# Patient Record
Sex: Female | Born: 1955 | Race: Black or African American | Hispanic: No | Marital: Married | Smoking: Current every day smoker
Health system: Southern US, Community
[De-identification: ages and names within clinical notes are randomized; demographics above are authoritative.]

## PROBLEM LIST (undated history)

## (undated) DIAGNOSIS — N823 Fistula of vagina to large intestine: Secondary | ICD-10-CM

## (undated) DIAGNOSIS — C719 Malignant neoplasm of brain, unspecified: Secondary | ICD-10-CM

## (undated) DIAGNOSIS — F329 Major depressive disorder, single episode, unspecified: Secondary | ICD-10-CM

## (undated) DIAGNOSIS — F419 Anxiety disorder, unspecified: Secondary | ICD-10-CM

## (undated) DIAGNOSIS — T7840XA Allergy, unspecified, initial encounter: Secondary | ICD-10-CM

## (undated) DIAGNOSIS — B1920 Unspecified viral hepatitis C without hepatic coma: Secondary | ICD-10-CM

## (undated) DIAGNOSIS — D649 Anemia, unspecified: Secondary | ICD-10-CM

## (undated) DIAGNOSIS — R32 Unspecified urinary incontinence: Secondary | ICD-10-CM

## (undated) DIAGNOSIS — G43909 Migraine, unspecified, not intractable, without status migrainosus: Secondary | ICD-10-CM

## (undated) DIAGNOSIS — E119 Type 2 diabetes mellitus without complications: Secondary | ICD-10-CM

## (undated) DIAGNOSIS — K7460 Unspecified cirrhosis of liver: Secondary | ICD-10-CM

## (undated) DIAGNOSIS — R569 Unspecified convulsions: Secondary | ICD-10-CM

## (undated) DIAGNOSIS — I639 Cerebral infarction, unspecified: Secondary | ICD-10-CM

## (undated) DIAGNOSIS — F32A Depression, unspecified: Secondary | ICD-10-CM

## (undated) DIAGNOSIS — K5792 Diverticulitis of intestine, part unspecified, without perforation or abscess without bleeding: Secondary | ICD-10-CM

## (undated) DIAGNOSIS — K852 Alcohol induced acute pancreatitis without necrosis or infection: Secondary | ICD-10-CM

## (undated) DIAGNOSIS — K219 Gastro-esophageal reflux disease without esophagitis: Secondary | ICD-10-CM

## (undated) DIAGNOSIS — J42 Unspecified chronic bronchitis: Secondary | ICD-10-CM

## (undated) DIAGNOSIS — J439 Emphysema, unspecified: Secondary | ICD-10-CM

## (undated) DIAGNOSIS — F1911 Other psychoactive substance abuse, in remission: Secondary | ICD-10-CM

## (undated) DIAGNOSIS — F1011 Alcohol abuse, in remission: Secondary | ICD-10-CM

## (undated) DIAGNOSIS — Z8619 Personal history of other infectious and parasitic diseases: Secondary | ICD-10-CM

## (undated) DIAGNOSIS — I509 Heart failure, unspecified: Secondary | ICD-10-CM

## (undated) DIAGNOSIS — I10 Essential (primary) hypertension: Secondary | ICD-10-CM

## (undated) DIAGNOSIS — R161 Splenomegaly, not elsewhere classified: Secondary | ICD-10-CM

## (undated) DIAGNOSIS — N189 Chronic kidney disease, unspecified: Secondary | ICD-10-CM

## (undated) DIAGNOSIS — R188 Other ascites: Secondary | ICD-10-CM

## (undated) HISTORY — DX: Malignant neoplasm of brain, unspecified: C71.9

## (undated) HISTORY — DX: Fistula of vagina to large intestine: N82.3

## (undated) HISTORY — DX: Gastro-esophageal reflux disease without esophagitis: K21.9

## (undated) HISTORY — DX: Other psychoactive substance abuse, in remission: F19.11

## (undated) HISTORY — DX: Unspecified chronic bronchitis: J42

## (undated) HISTORY — DX: Unspecified convulsions: R56.9

## (undated) HISTORY — PX: JOINT REPLACEMENT: SHX530

## (undated) HISTORY — DX: Essential (primary) hypertension: I10

## (undated) HISTORY — DX: Alcohol induced acute pancreatitis without necrosis or infection: K85.20

## (undated) HISTORY — DX: Depression, unspecified: F32.A

## (undated) HISTORY — DX: Allergy, unspecified, initial encounter: T78.40XA

## (undated) HISTORY — DX: Cerebral infarction, unspecified: I63.9

## (undated) HISTORY — DX: Migraine, unspecified, not intractable, without status migrainosus: G43.909

## (undated) HISTORY — DX: Unspecified viral hepatitis C without hepatic coma: B19.20

## (undated) HISTORY — DX: Diverticulitis of intestine, part unspecified, without perforation or abscess without bleeding: K57.92

## (undated) HISTORY — DX: Unspecified urinary incontinence: R32

## (undated) HISTORY — DX: Major depressive disorder, single episode, unspecified: F32.9

## (undated) HISTORY — DX: Emphysema, unspecified: J43.9

## (undated) HISTORY — PX: EYE SURGERY: SHX253

## (undated) HISTORY — DX: Alcohol abuse, in remission: F10.11

## (undated) HISTORY — DX: Chronic kidney disease, unspecified: N18.9

## (undated) HISTORY — PX: DILATION AND CURETTAGE OF UTERUS: SHX78

---

## 1977-07-08 HISTORY — PX: ABDOMINAL HYSTERECTOMY: SHX81

## 1997-07-08 HISTORY — PX: APPENDECTOMY: SHX54

## 1999-07-09 HISTORY — PX: CHOLECYSTECTOMY: SHX55

## 2003-07-09 HISTORY — PX: REPLACEMENT TOTAL KNEE: SUR1224

## 2003-08-08 ENCOUNTER — Other Ambulatory Visit: Payer: Self-pay

## 2004-05-19 ENCOUNTER — Emergency Department: Payer: Self-pay | Admitting: Emergency Medicine

## 2004-05-19 ENCOUNTER — Other Ambulatory Visit: Payer: Self-pay

## 2004-06-05 ENCOUNTER — Ambulatory Visit: Payer: Self-pay | Admitting: Internal Medicine

## 2004-06-06 ENCOUNTER — Ambulatory Visit: Payer: Self-pay | Admitting: Internal Medicine

## 2004-07-03 ENCOUNTER — Emergency Department: Payer: Self-pay | Admitting: Emergency Medicine

## 2004-07-08 HISTORY — PX: COLON SURGERY: SHX602

## 2004-08-12 ENCOUNTER — Emergency Department: Payer: Self-pay | Admitting: Emergency Medicine

## 2004-08-13 ENCOUNTER — Other Ambulatory Visit: Payer: Self-pay

## 2004-08-20 ENCOUNTER — Other Ambulatory Visit: Payer: Self-pay

## 2004-08-20 ENCOUNTER — Emergency Department: Payer: Self-pay | Admitting: Emergency Medicine

## 2004-08-23 ENCOUNTER — Emergency Department: Payer: Self-pay | Admitting: Internal Medicine

## 2004-08-23 ENCOUNTER — Other Ambulatory Visit: Payer: Self-pay

## 2004-08-30 ENCOUNTER — Ambulatory Visit: Payer: Self-pay

## 2004-09-01 ENCOUNTER — Inpatient Hospital Stay: Payer: Self-pay | Admitting: Internal Medicine

## 2004-09-01 ENCOUNTER — Other Ambulatory Visit: Payer: Self-pay

## 2004-10-23 ENCOUNTER — Emergency Department: Payer: Self-pay | Admitting: Emergency Medicine

## 2004-12-25 ENCOUNTER — Ambulatory Visit: Payer: Self-pay | Admitting: Surgery

## 2005-01-03 ENCOUNTER — Other Ambulatory Visit: Payer: Self-pay

## 2005-01-09 ENCOUNTER — Inpatient Hospital Stay: Payer: Self-pay | Admitting: Surgery

## 2005-01-25 ENCOUNTER — Emergency Department: Payer: Self-pay | Admitting: Emergency Medicine

## 2005-02-12 ENCOUNTER — Inpatient Hospital Stay: Payer: Self-pay | Admitting: Surgery

## 2005-02-25 ENCOUNTER — Emergency Department: Payer: Self-pay | Admitting: Emergency Medicine

## 2005-02-26 ENCOUNTER — Inpatient Hospital Stay: Payer: Self-pay | Admitting: Vascular Surgery

## 2005-04-24 ENCOUNTER — Ambulatory Visit: Payer: Self-pay | Admitting: Surgery

## 2005-07-02 ENCOUNTER — Emergency Department: Payer: Self-pay | Admitting: Emergency Medicine

## 2005-07-08 DIAGNOSIS — I639 Cerebral infarction, unspecified: Secondary | ICD-10-CM

## 2005-07-08 DIAGNOSIS — C719 Malignant neoplasm of brain, unspecified: Secondary | ICD-10-CM

## 2005-07-08 HISTORY — PX: BRAIN SURGERY: SHX531

## 2005-07-08 HISTORY — DX: Malignant neoplasm of brain, unspecified: C71.9

## 2005-07-08 HISTORY — DX: Cerebral infarction, unspecified: I63.9

## 2005-07-17 ENCOUNTER — Ambulatory Visit: Payer: Self-pay | Admitting: Surgery

## 2005-07-28 ENCOUNTER — Inpatient Hospital Stay: Payer: Self-pay | Admitting: Surgery

## 2005-07-30 ENCOUNTER — Other Ambulatory Visit: Payer: Self-pay

## 2005-09-02 ENCOUNTER — Other Ambulatory Visit: Payer: Self-pay

## 2005-09-02 ENCOUNTER — Inpatient Hospital Stay: Payer: Self-pay | Admitting: Internal Medicine

## 2005-12-03 ENCOUNTER — Emergency Department: Payer: Self-pay | Admitting: Emergency Medicine

## 2005-12-11 ENCOUNTER — Ambulatory Visit: Payer: Self-pay | Admitting: Surgery

## 2006-01-09 ENCOUNTER — Ambulatory Visit: Payer: Self-pay | Admitting: Surgery

## 2006-01-23 ENCOUNTER — Ambulatory Visit: Payer: Self-pay | Admitting: Surgery

## 2006-02-25 ENCOUNTER — Inpatient Hospital Stay: Payer: Self-pay | Admitting: Surgery

## 2006-03-20 ENCOUNTER — Other Ambulatory Visit: Payer: Self-pay

## 2006-03-21 ENCOUNTER — Inpatient Hospital Stay: Payer: Self-pay | Admitting: Internal Medicine

## 2006-05-08 ENCOUNTER — Ambulatory Visit: Payer: Self-pay | Admitting: Internal Medicine

## 2006-05-17 IMAGING — CT CT HEAD WITHOUT CONTRAST
2 series · 16 of 30 positions shown, 20 images · non-contrast
Comparison: none

REASON FOR EXAM: vomitting
COMMENTS:

[Series 2: without · axial · non-contrast · 0.40mm/px · z∈[+154,+274]mm · 13 of 28 slices shown, 17 images]
[im 2/28  brain]
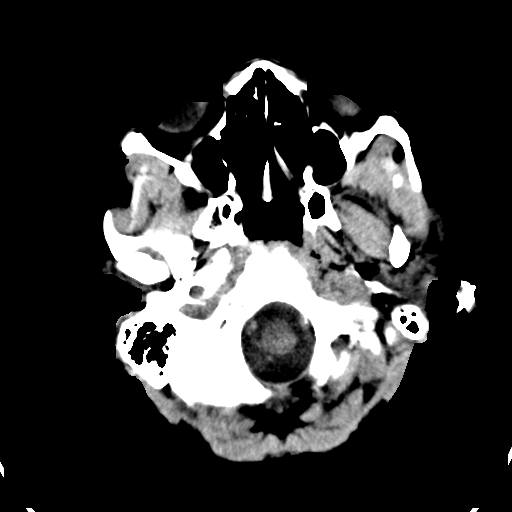
[im 2/28  bone]
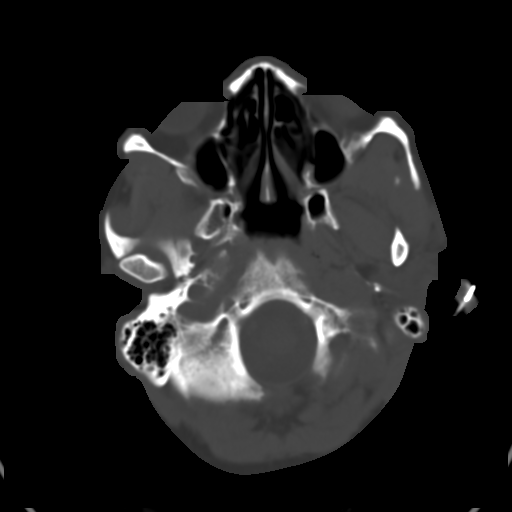
[im 4/28  brain]
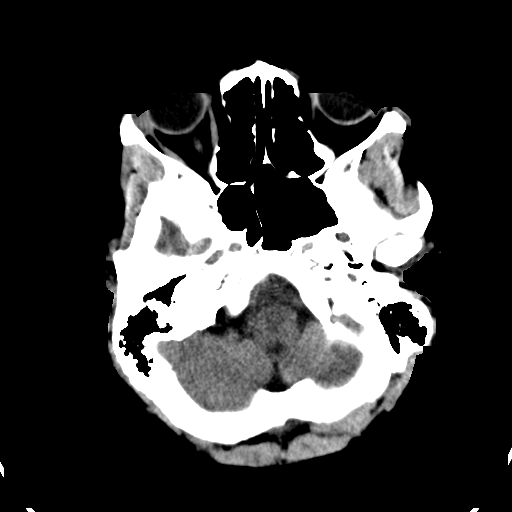
[im 6/28  brain]
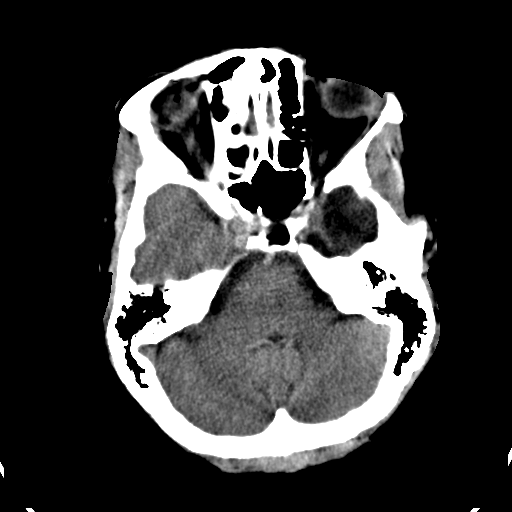
[im 8/28  brain]
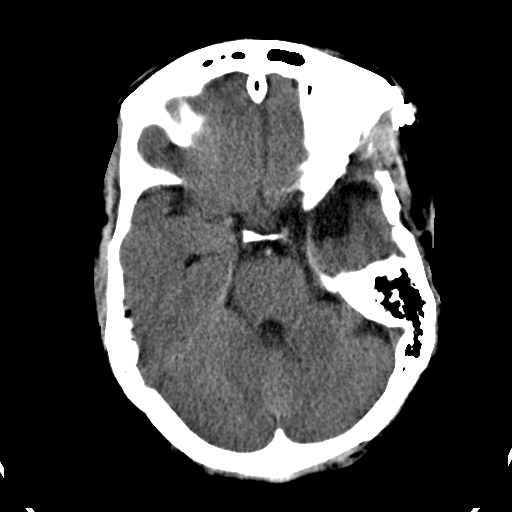
[im 10/28  brain]
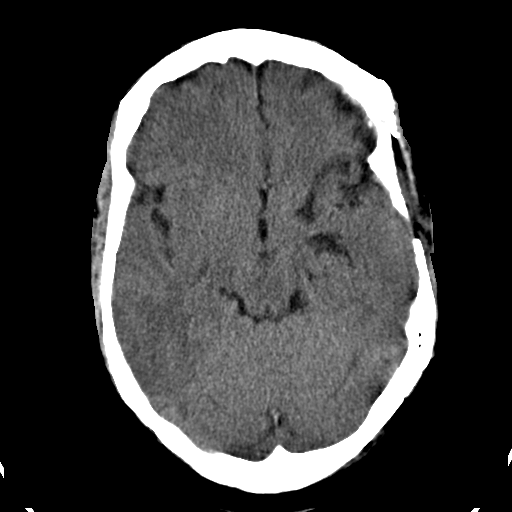
[im 10/28  bone]
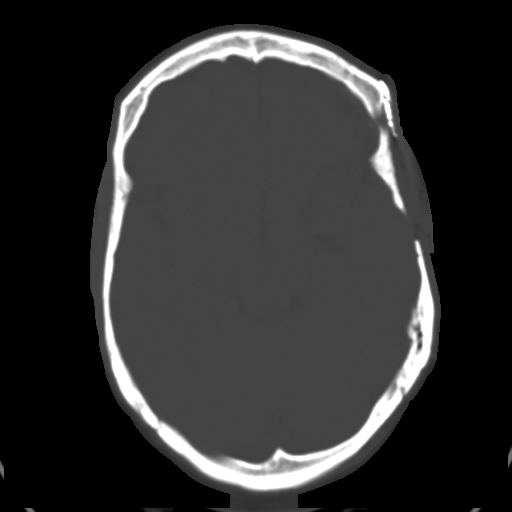
[im 12/28  brain]
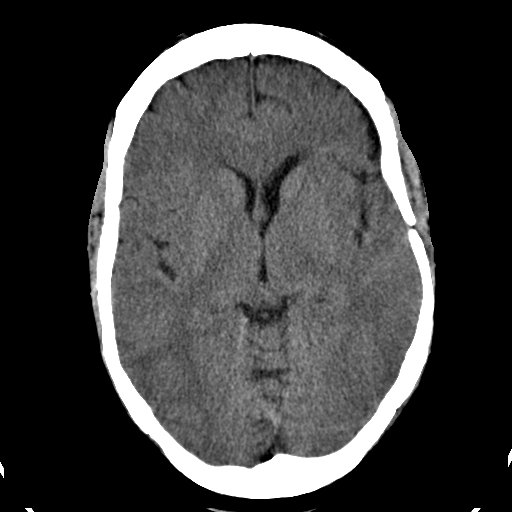
[im 14/28  brain]
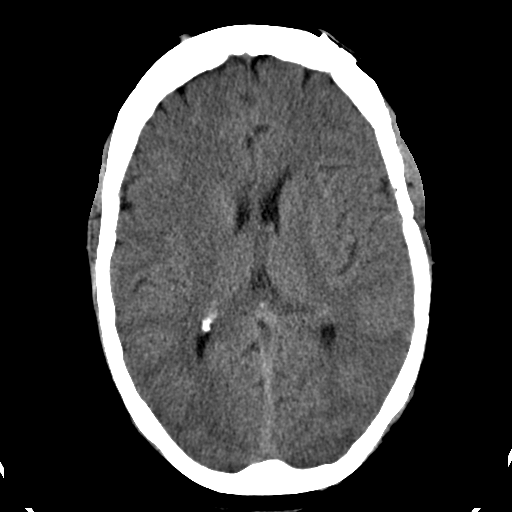
[im 16/28  brain]
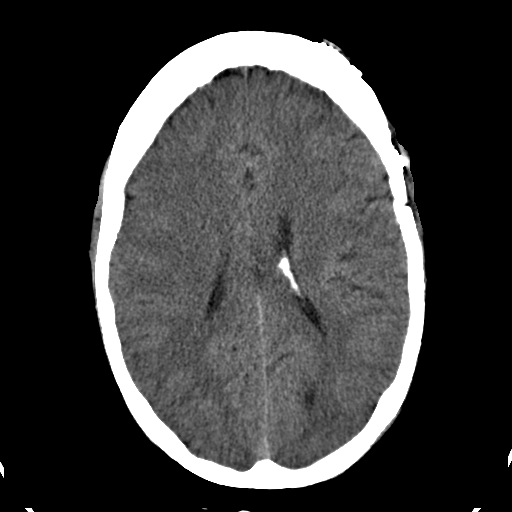
[im 18/28  brain]
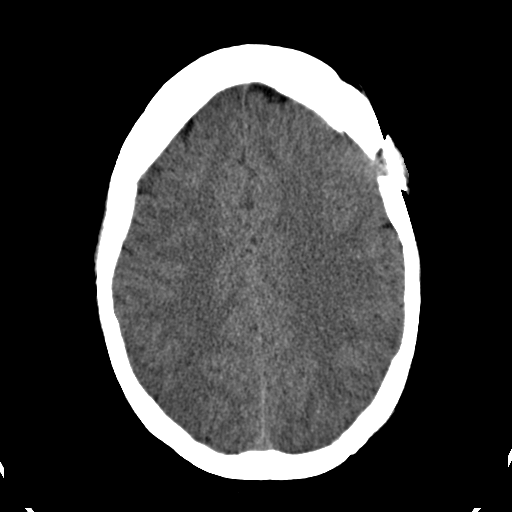
[im 18/28  bone]
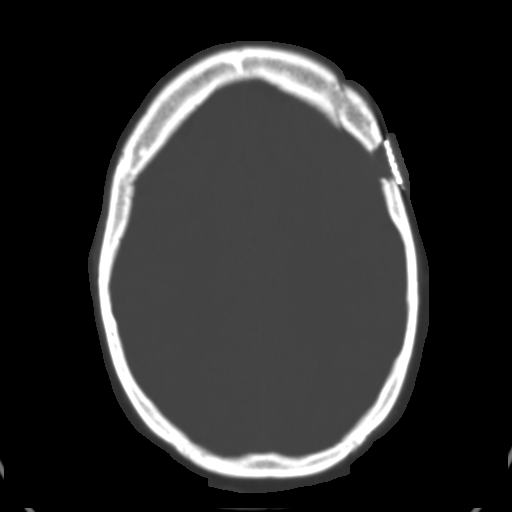
[im 20/28  brain]
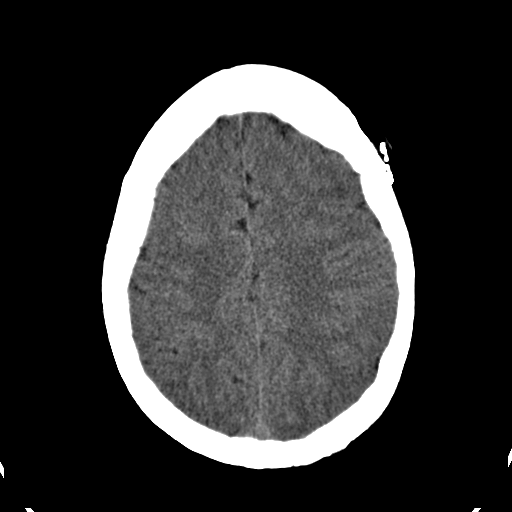
[im 22/28  brain]
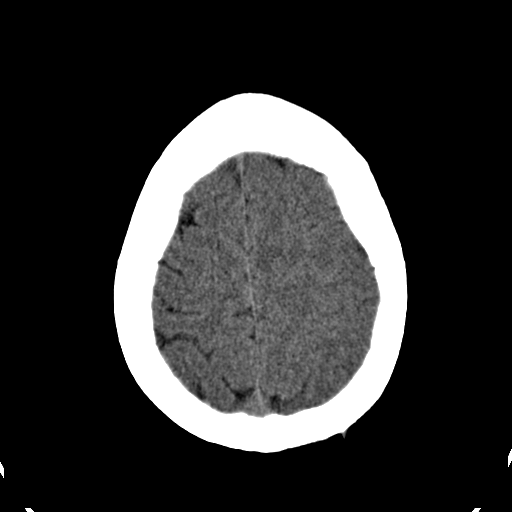
[im 24/28  brain]
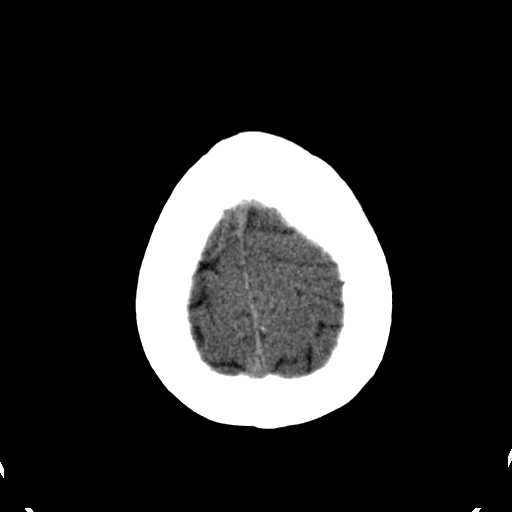
[im 26/28  brain]
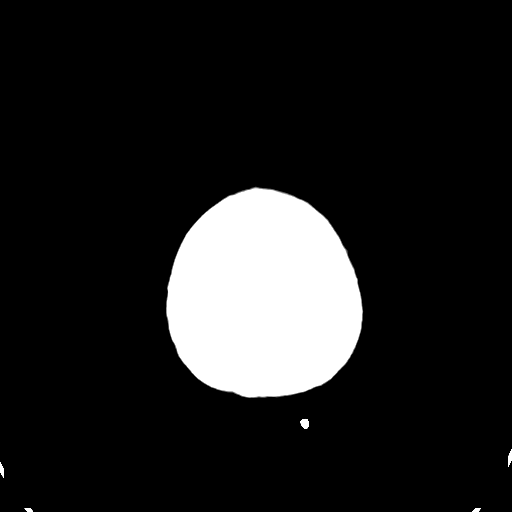
[im 26/28  bone]
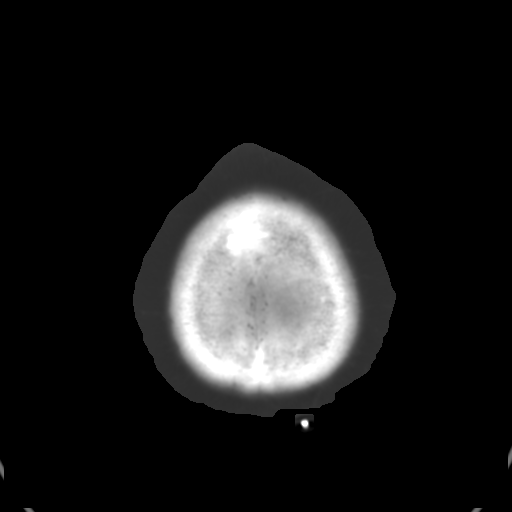

[Series 3: bone windows · axial · 0.40mm/px · z∈[+154,+194]mm · 3 of 28 slices shown]
[im 2/28  bone]
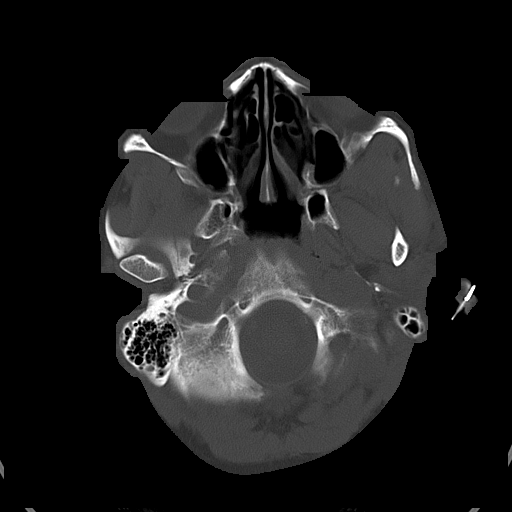
[im 6/28  bone]
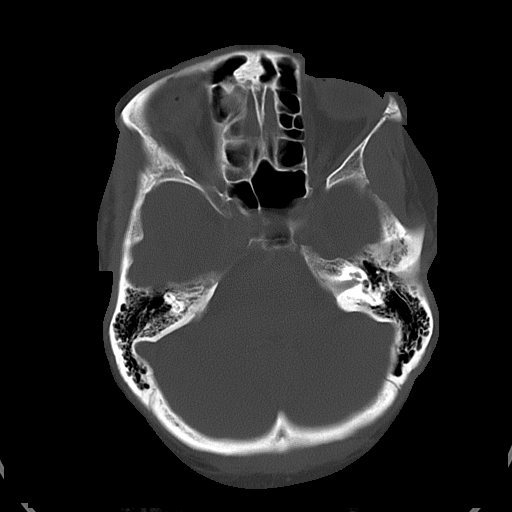
[im 10/28  bone]
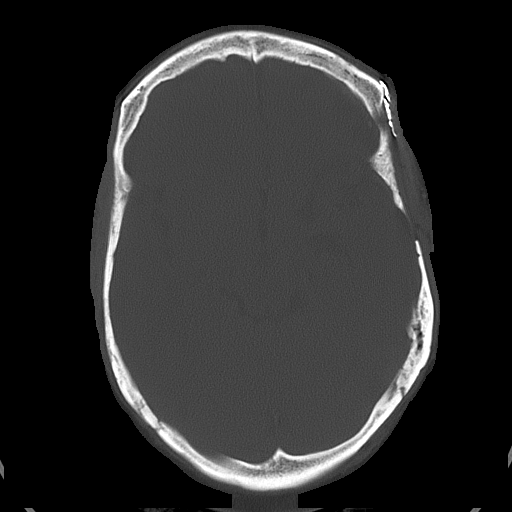

[16 of 30 positions shown; findings below may reference images not displayed]

PROCEDURE:     CT  - CT HEAD WITHOUT CONTRAST  - June 06, 2004 [DATE]

RESULT:     The patient is experiencing unexplained vomiting.  The patient
has a history of brain tumor with surgery in the past.  Comparison is made
to study 01 March, 2004.

There is post surgical changes noted on the LEFT in the temporal fossa.
This appears stable.  I do not see evidence of a mass or mass effect
elsewhere within the brain.  There is no evidence of an intracranial
hemorrhage. At bone window settings the post surgical defects are visible in
the LEFT frontal and parietal regions.
IMPRESSION: 1)There are chronic changes in the LEFT temporal fossa from the patient's
prior surgery.  I do not see evidence of acute intracranial abnormality.

## 2006-05-20 ENCOUNTER — Ambulatory Visit: Payer: Self-pay | Admitting: Internal Medicine

## 2006-07-02 ENCOUNTER — Other Ambulatory Visit: Payer: Self-pay

## 2006-07-02 ENCOUNTER — Emergency Department: Payer: Self-pay | Admitting: Unknown Physician Specialty

## 2006-08-14 IMAGING — US ABDOMEN ULTRASOUND
1 series · 17 of 25 positions shown · non-contrast
Comparison: none

REASON FOR EXAM: Ascites
COMMENTS:

[Series 1: abdomen ultrasound · 17 of 50 slices shown]
[im 1/50]
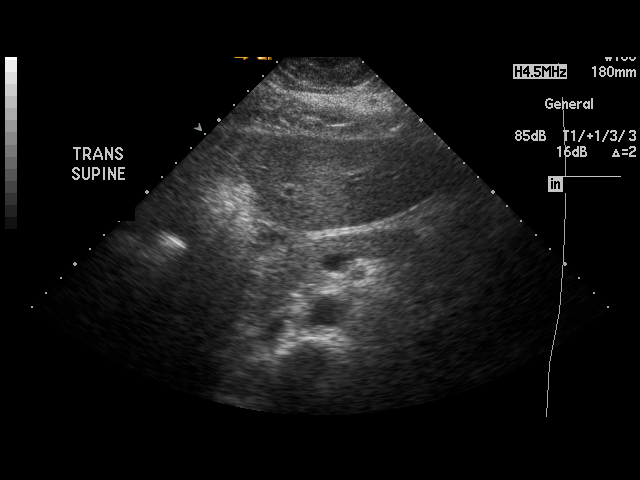
[im 5/50]
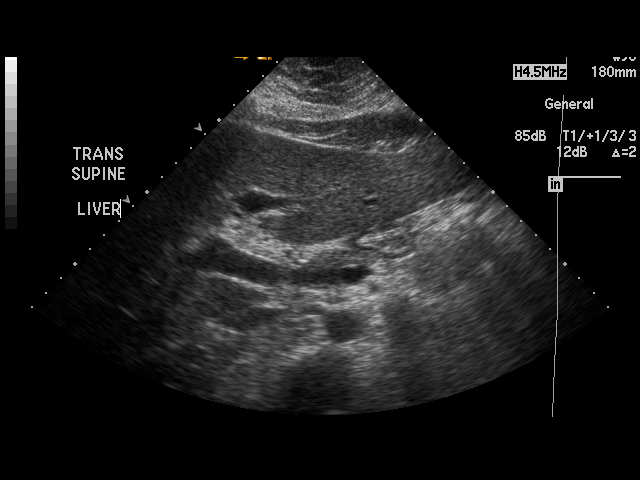
[im 7/50]
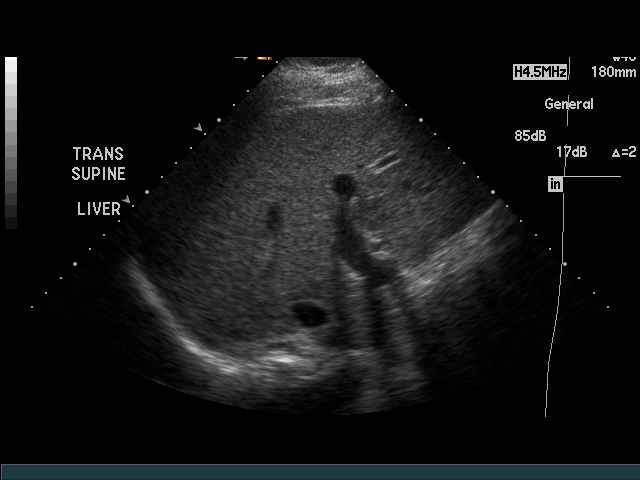
[im 11/50]
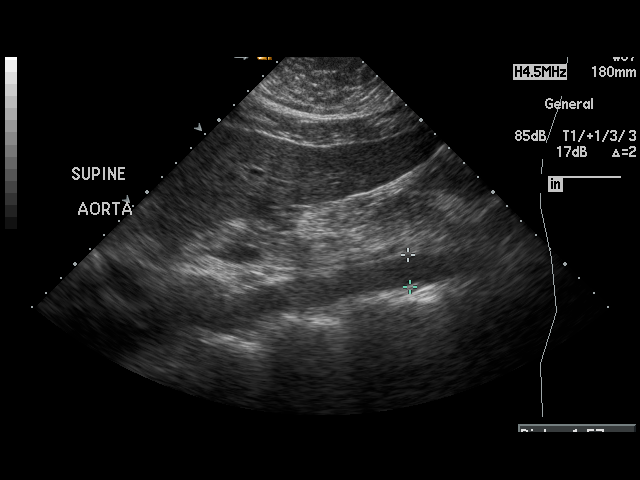
[im 13/50]
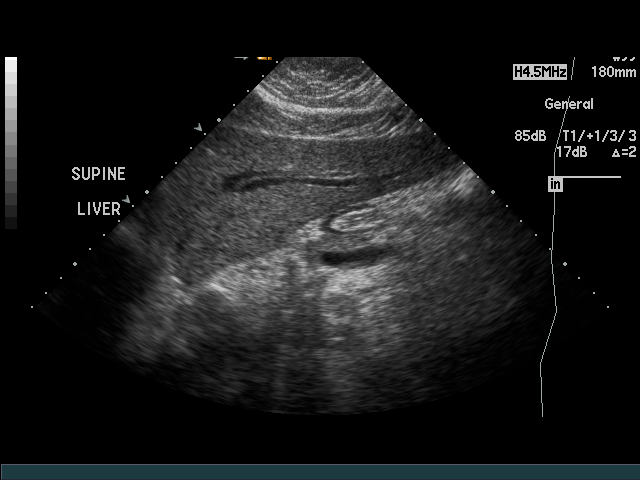
[im 17/50]
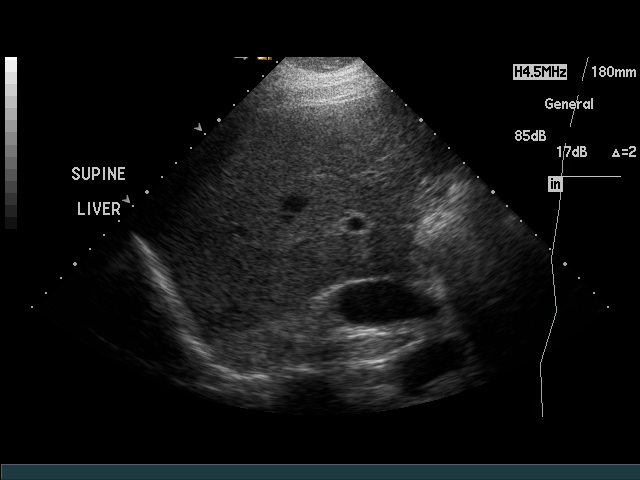
[im 19/50]
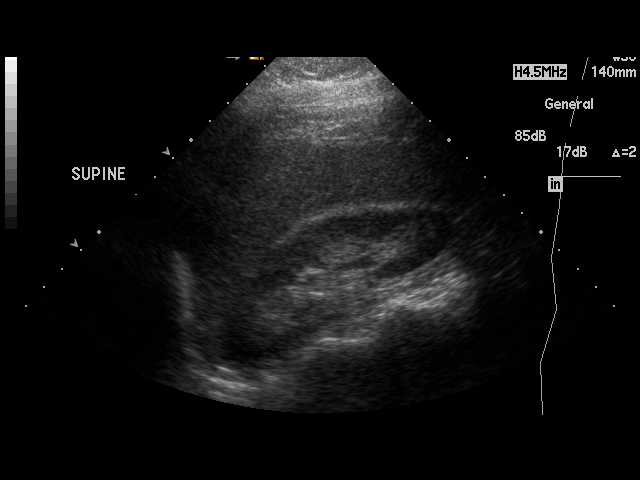
[im 23/50]
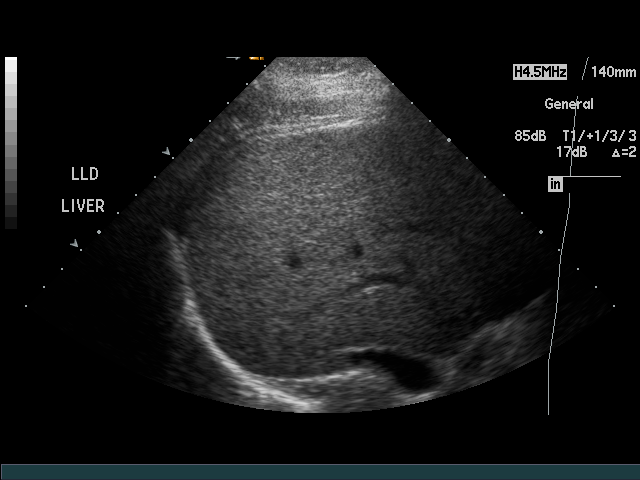
[im 25/50]
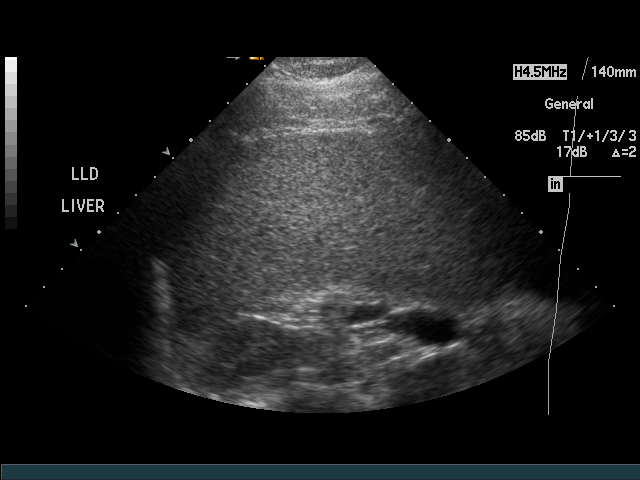
[im 27/50]
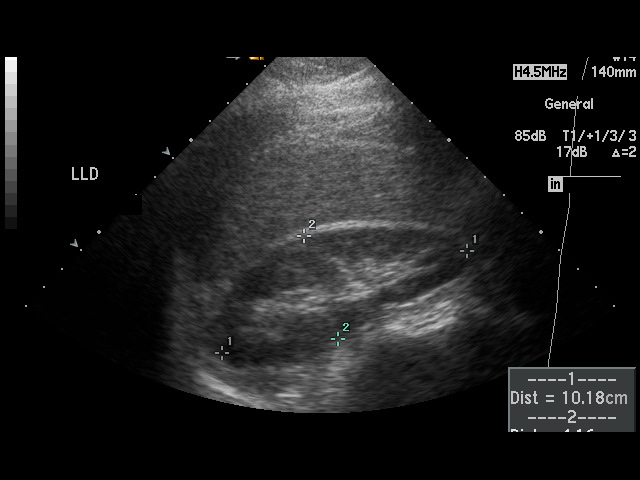
[im 31/50]
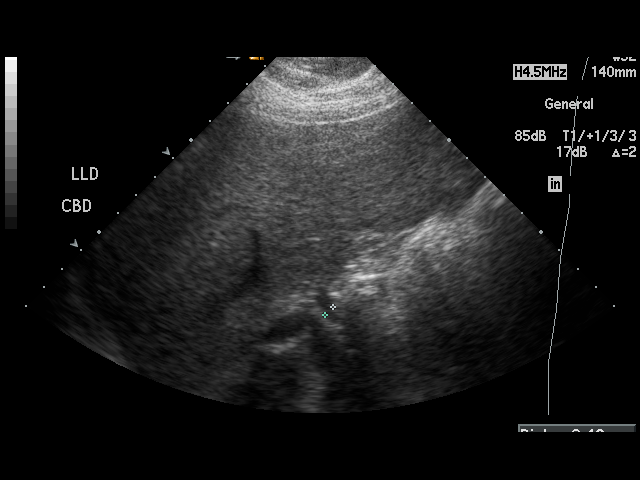
[im 33/50]
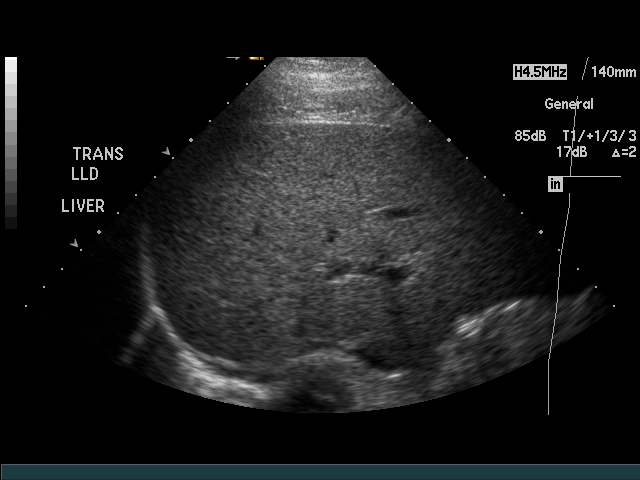
[im 37/50]
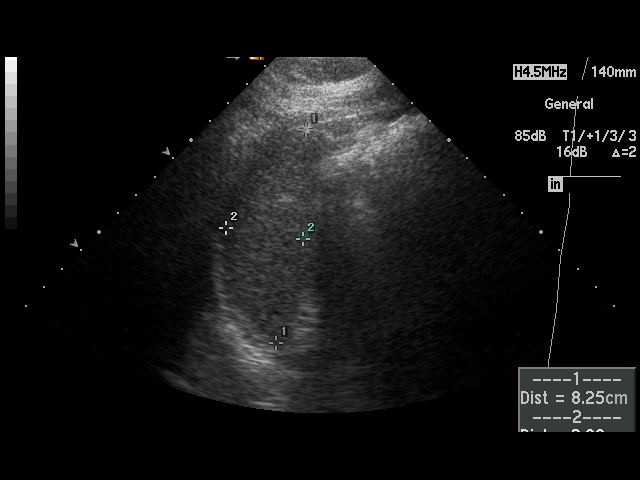
[im 39/50]
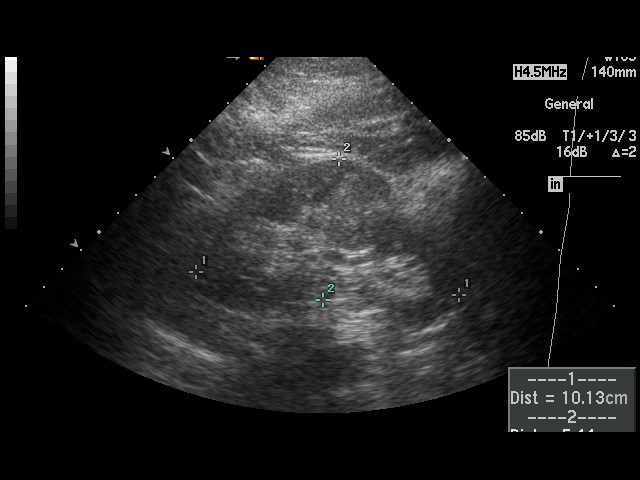
[im 43/50]
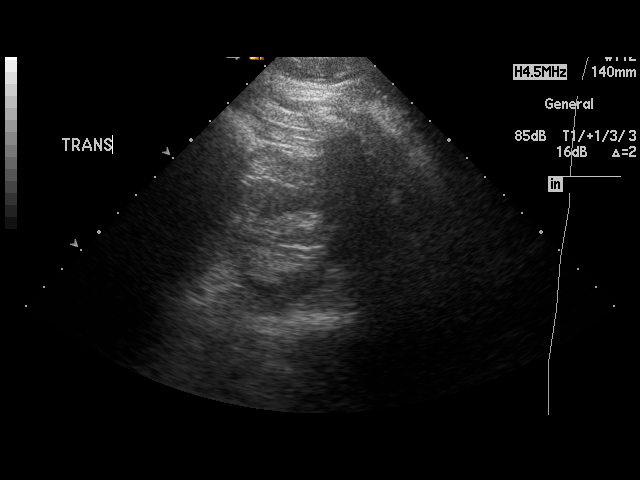
[im 45/50]
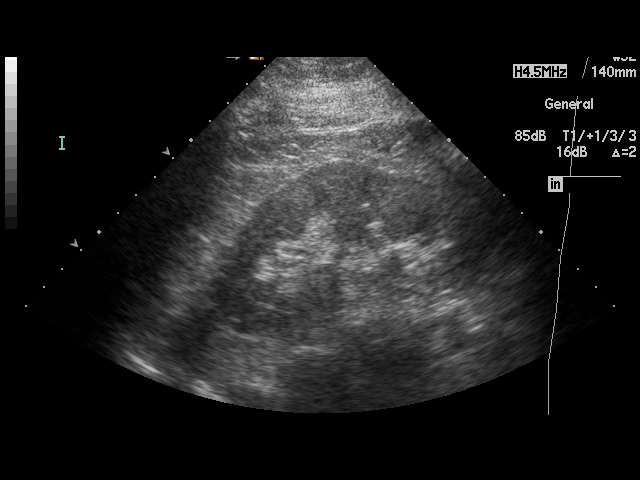
[im 50/50]
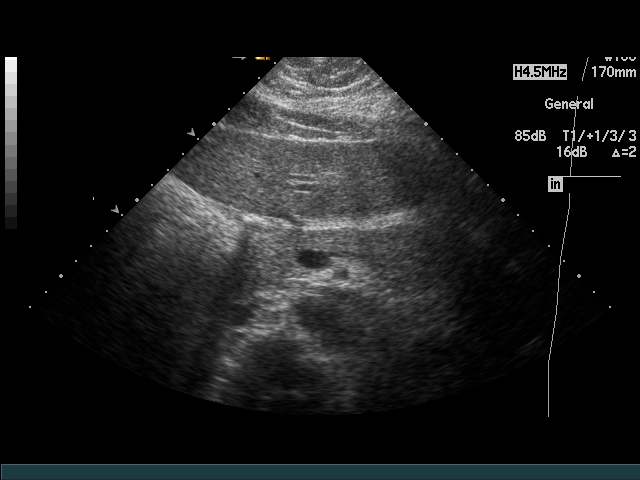

[17 of 25 positions shown; findings below may reference images not displayed]

PROCEDURE:     US  - US ABDOMEN GENERAL SURVEY  - September 03, 2004  [DATE]

RESULT:     The pancreas shows no evidence of mass or ductal dilatation.
The liver demonstrates normal echo texture without a focal mass lesion.  The
gallbladder has been previously removed.  The common bile duct is normal.
The kidneys are symmetric in length at 10 cm without hydronephrosis, calculi
or mass.  The spleen is not enlarged.  The aorta tapers normally.
IMPRESSION: Normal abdominal ultrasound.

## 2006-10-20 ENCOUNTER — Other Ambulatory Visit: Payer: Self-pay

## 2006-10-20 ENCOUNTER — Emergency Department: Payer: Self-pay | Admitting: Emergency Medicine

## 2006-10-25 ENCOUNTER — Emergency Department: Payer: Self-pay | Admitting: Emergency Medicine

## 2006-12-05 IMAGING — RF DG BARIUM ENEMA
2 series · 16 of 21 positions shown · non-contrast
Comparison: none

REASON FOR EXAM: rectovaginal fistula
COMMENTS:

PROCEDURE:     FL  - FL BARIUM ENEMA (COLON)  - December 25, 2004  [DATE]
RESULT:       Barium was introduced per rectum to the level of the distal
cecum and terminal ileum.  No colonic abnormalities are identified.  No
evidence of rectovaginal fistula is noted.

[Series 1: run · 10 of 13 slices shown]
[im 1/13]
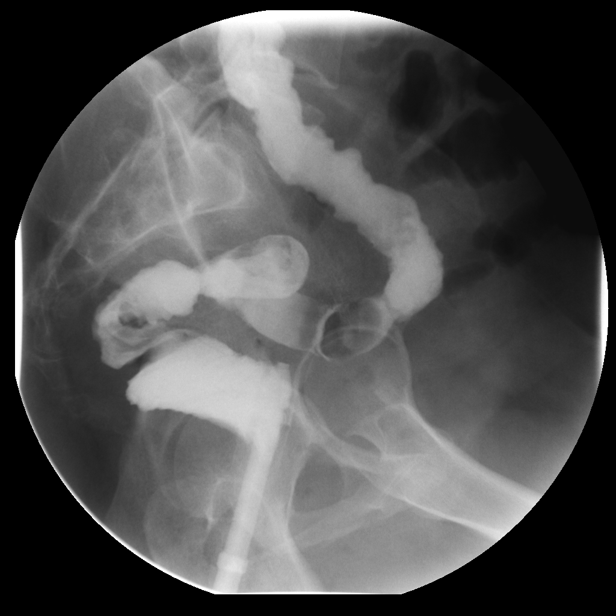
[im 2/13]
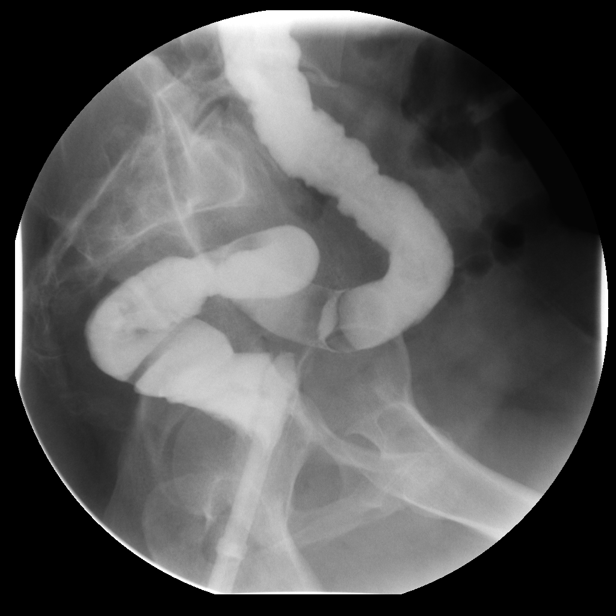
[im 4/13]
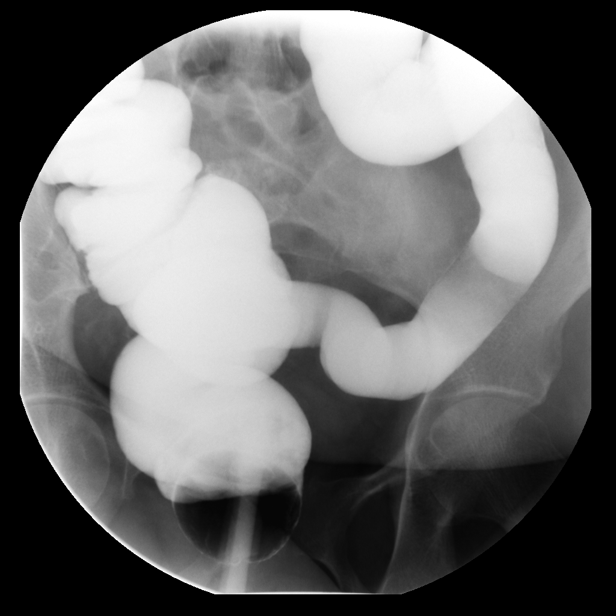
[im 5/13]
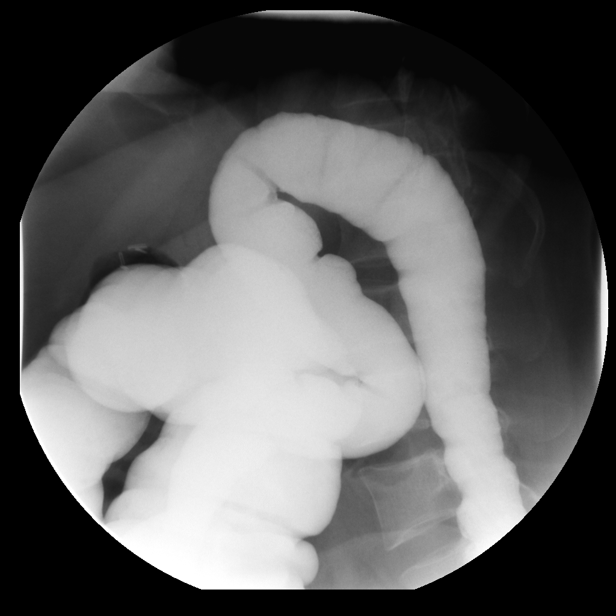
[im 6/13]
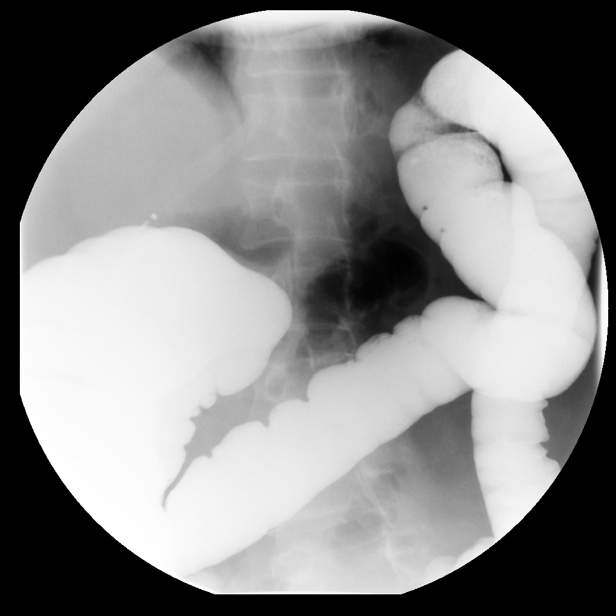
[im 8/13]
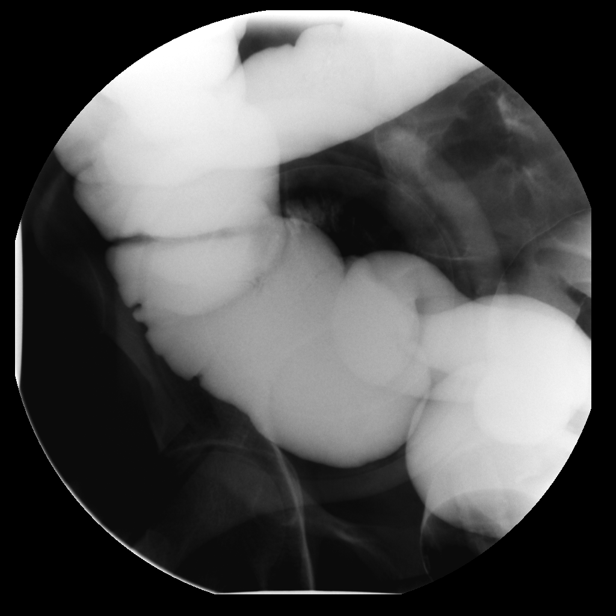
[im 9/13]
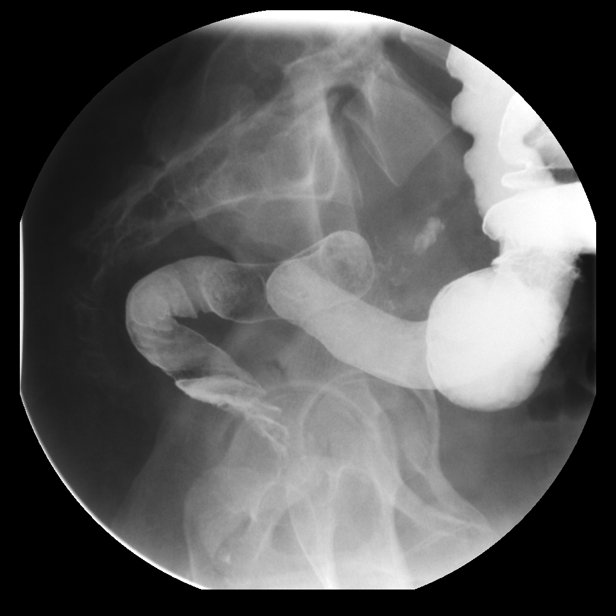
[im 10/13]
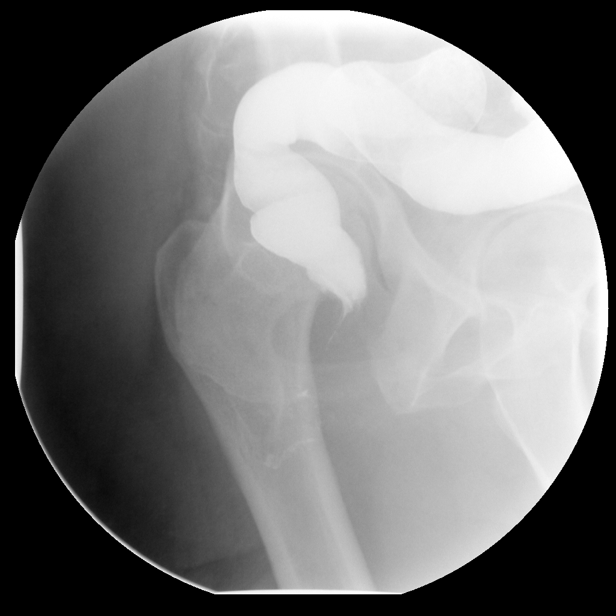
[im 12/13]
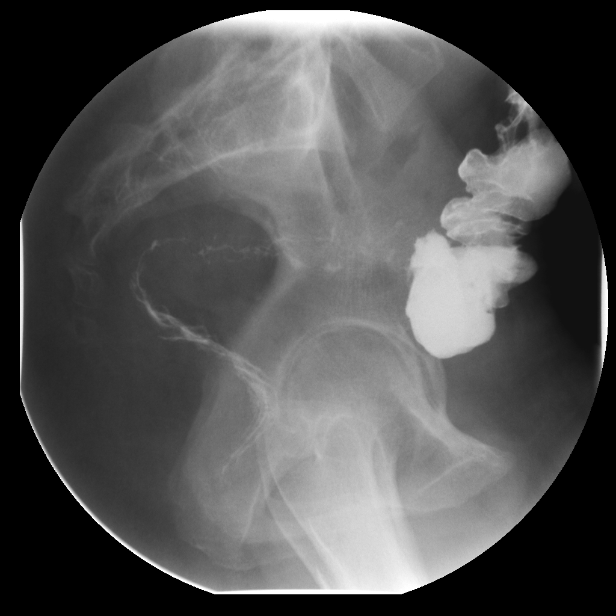
[im 13/13]
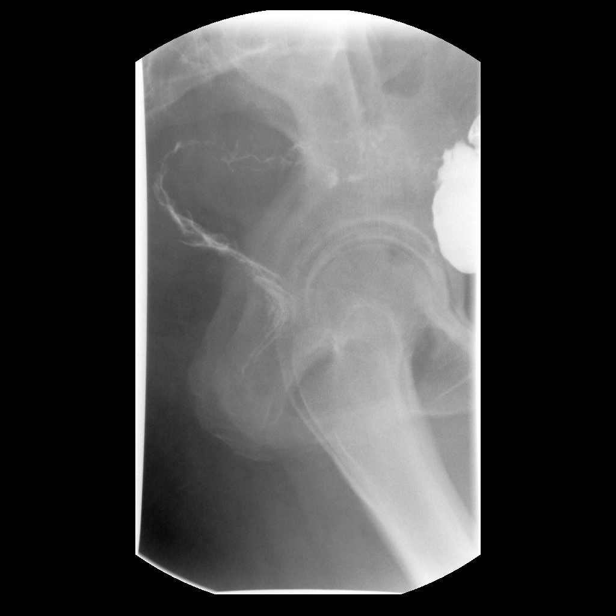

[Series 1: view not recorded · 0.17mm/px · 6 of 8 slices shown]
[im 1/8]
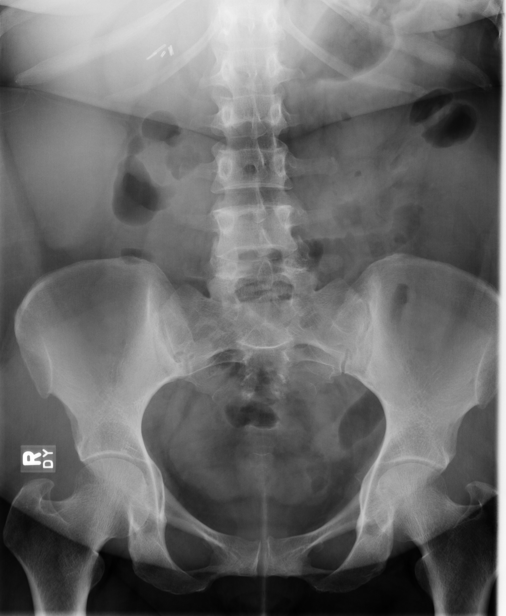
[im 3/8]
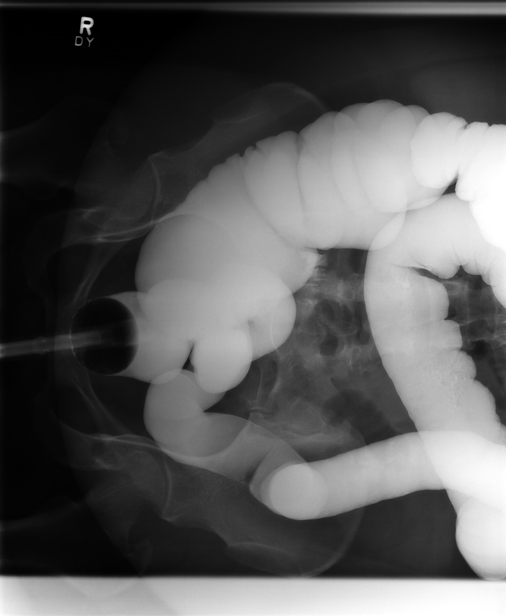
[im 4/8]
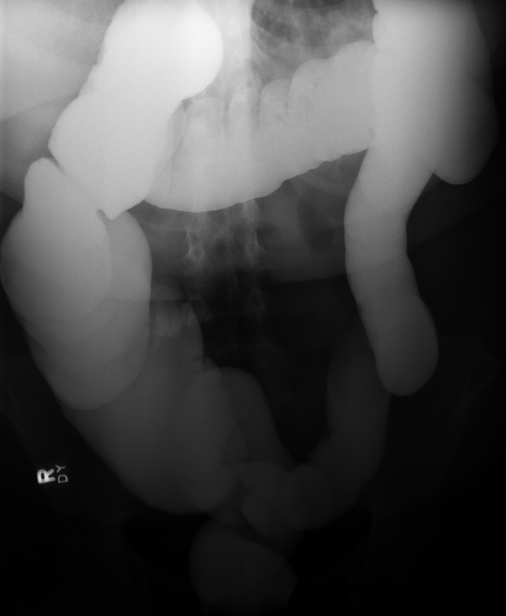
[im 5/8]
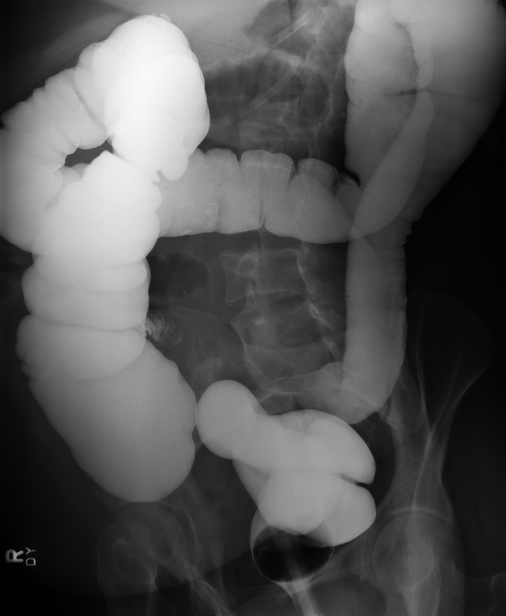
[im 7/8]
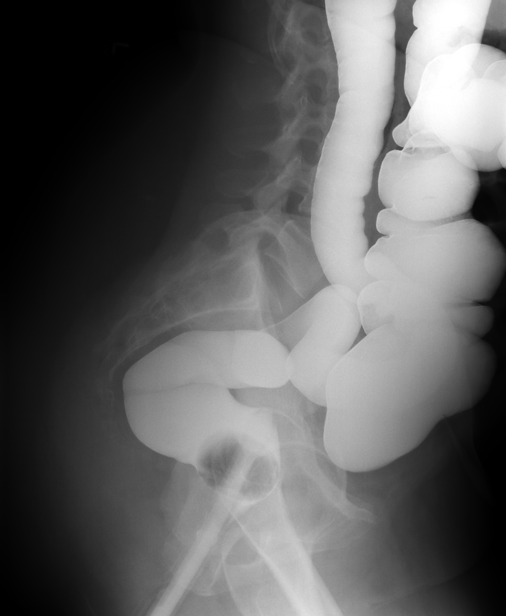
[im 8/8]
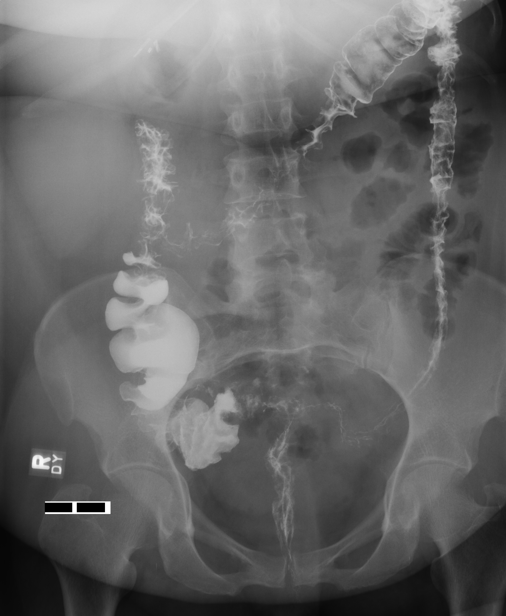

[16 of 21 positions shown; findings below may reference images not displayed]

IMPRESSION: 1.     Normal barium enema.
2.     No evidence of rectovaginal fistula.

## 2006-12-14 IMAGING — CR DG CHEST 2V
1 series · 2 of 2 positions shown · non-contrast
Comparison: none

REASON FOR EXAM: sob,cough,emphysema,cirrhosis liver
COMMENTS:

PROCEDURE:     DXR - DXR CHEST PA (OR AP) AND LATERAL  - January 03, 2005  [DATE]
RESULT:       The lung fields are clear.  The heart, mediastinal and osseous
structures show no significant abnormalities.

[Series 1: view not recorded · 0.17mm/px · 2 of 2 slices shown]
[im 1/2]
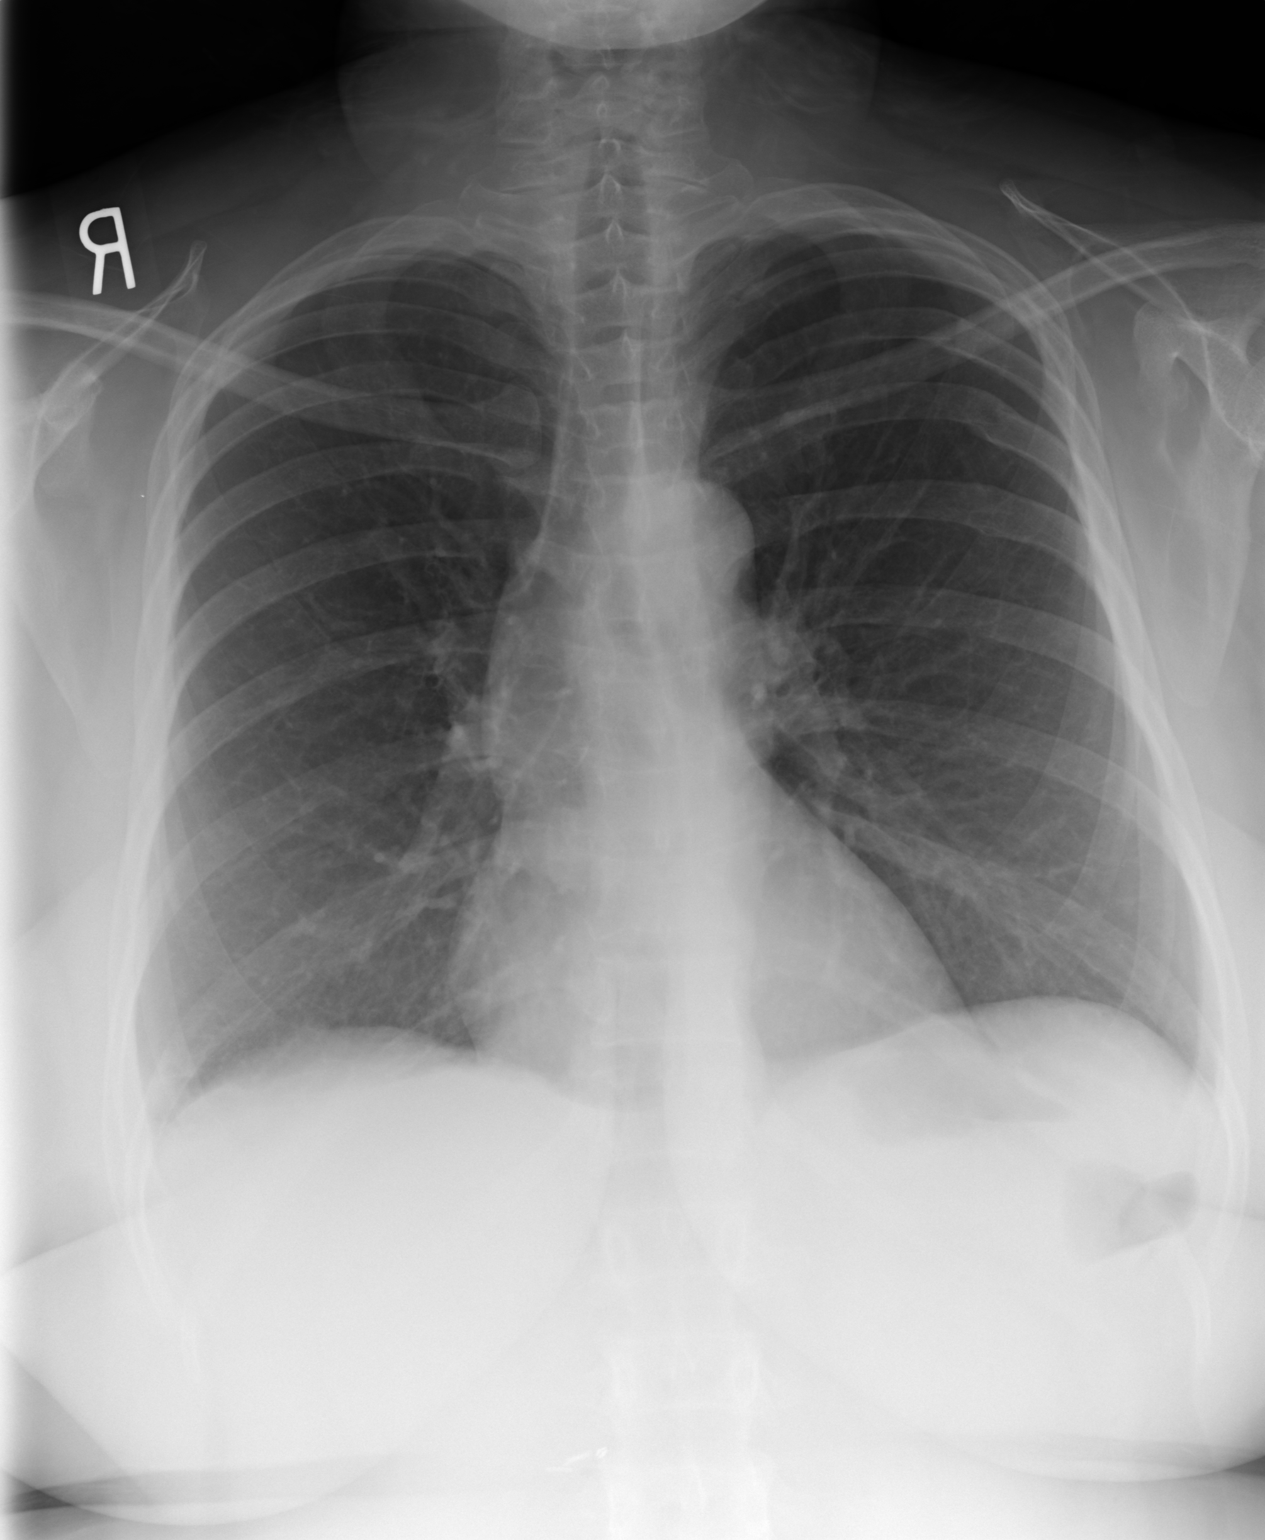
[im 2/2]
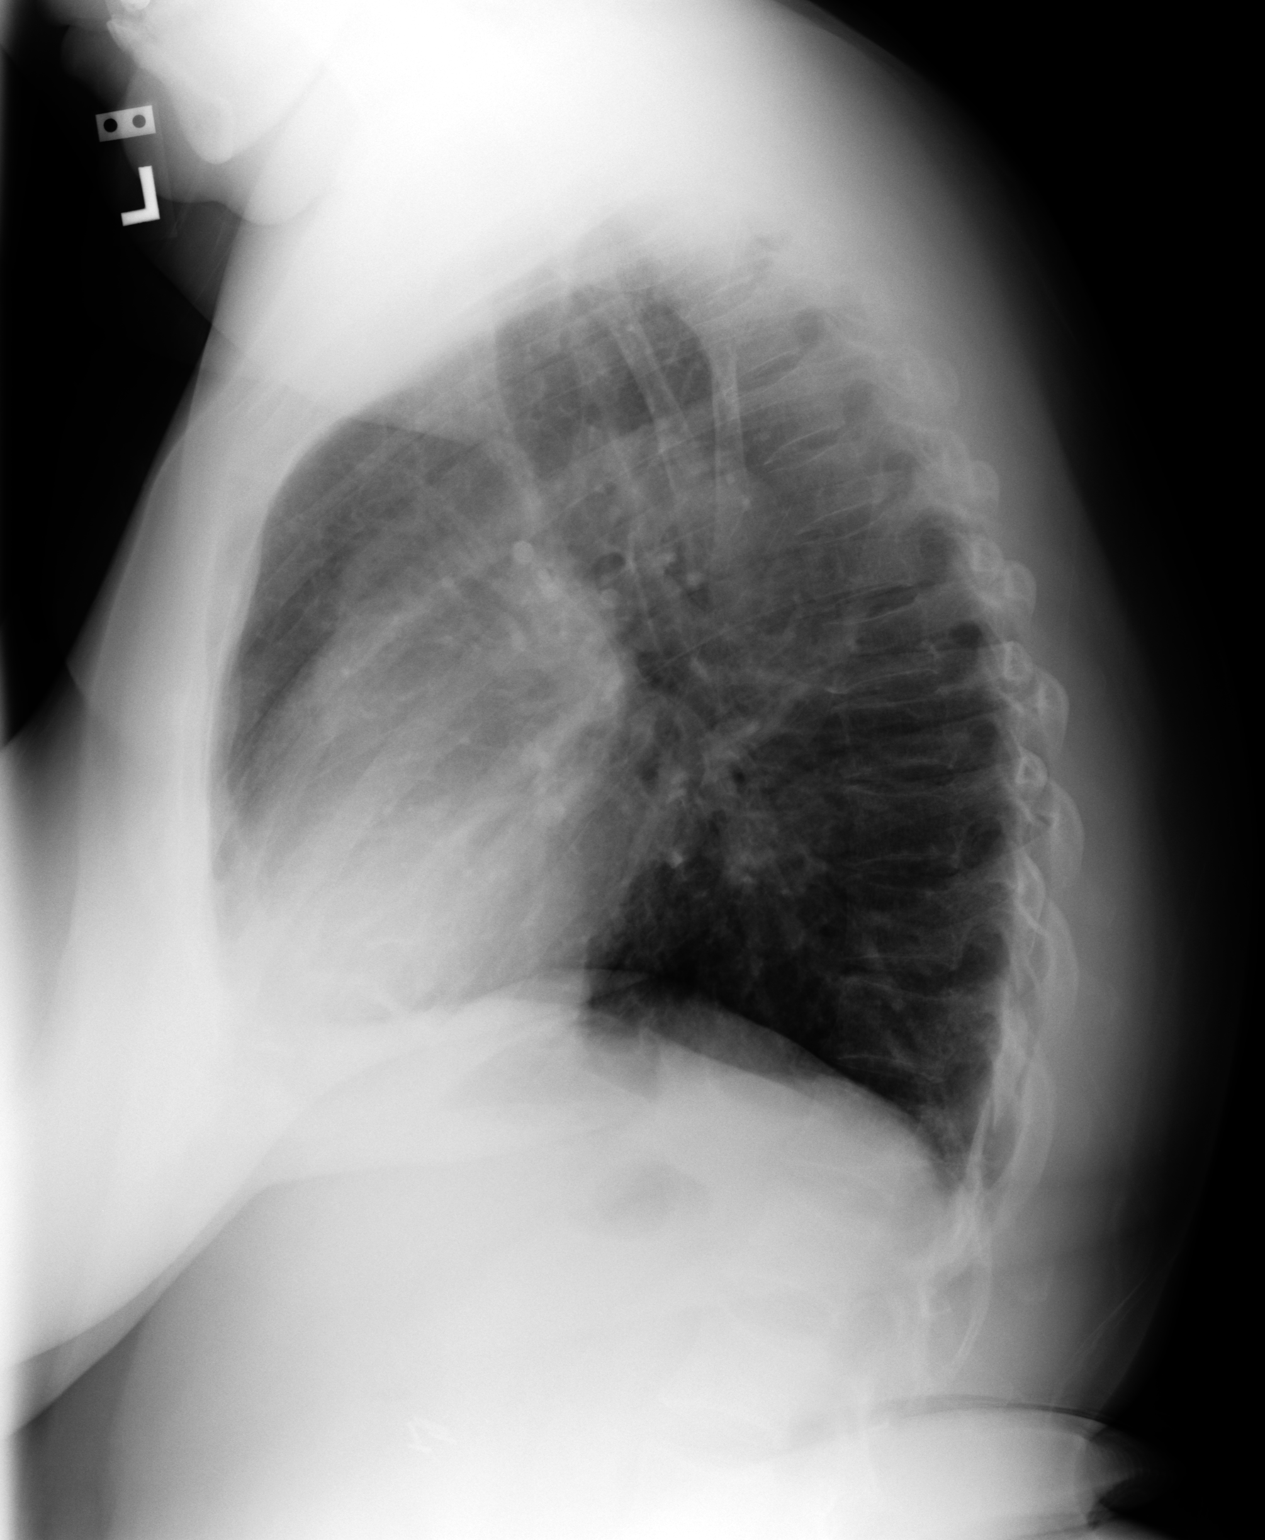

[2 of 2 positions shown; findings below may reference images not displayed]

IMPRESSION: No acute changes are identified.

## 2006-12-18 ENCOUNTER — Emergency Department: Payer: Self-pay | Admitting: Emergency Medicine

## 2006-12-27 IMAGING — CT CT ABD-PELV W/ CM
1 of 2 series · 14 of 32 positions shown, 18 images · non-contrast
Comparison: none

REASON FOR EXAM: Fever, recent surgery
COMMENTS:

[Series 2: abdomen · axial · 0.91mm/px · z∈[-709,-301]mm · 14 of 59 slices shown, 18 images]
[im 5/59  soft-tissue]
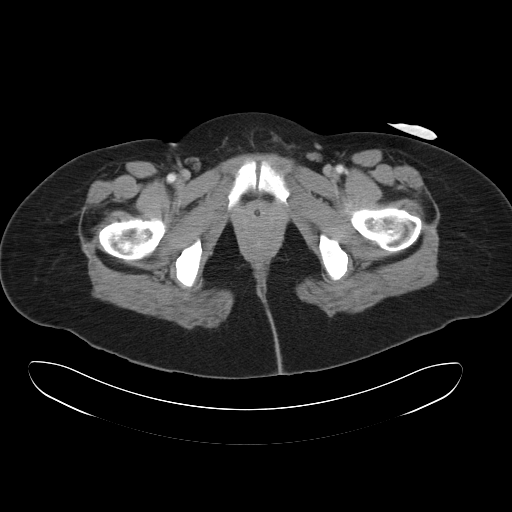
[im 5/59  bone]
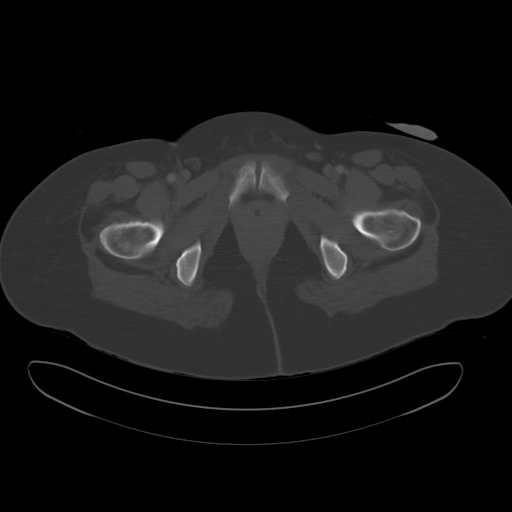
[im 10/59  soft-tissue]
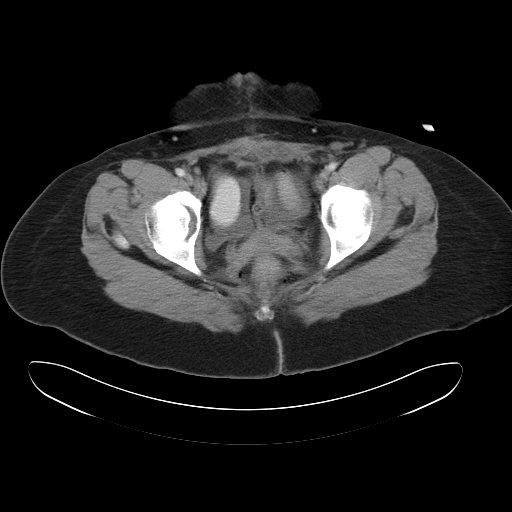
[im 14/59  soft-tissue]
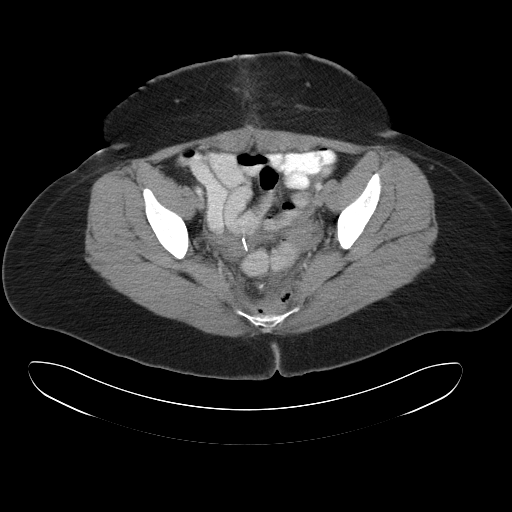
[im 19/59  soft-tissue]
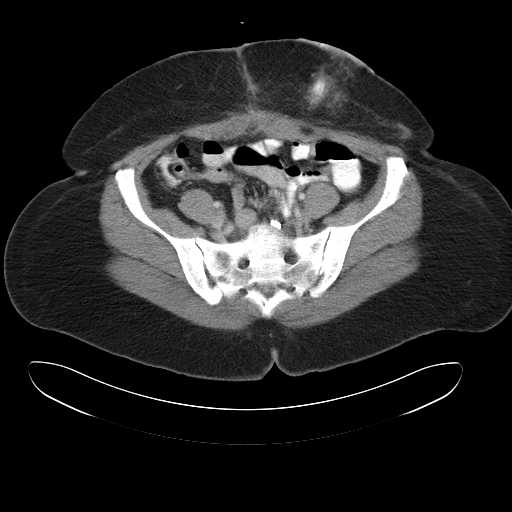
[im 24/59  soft-tissue]
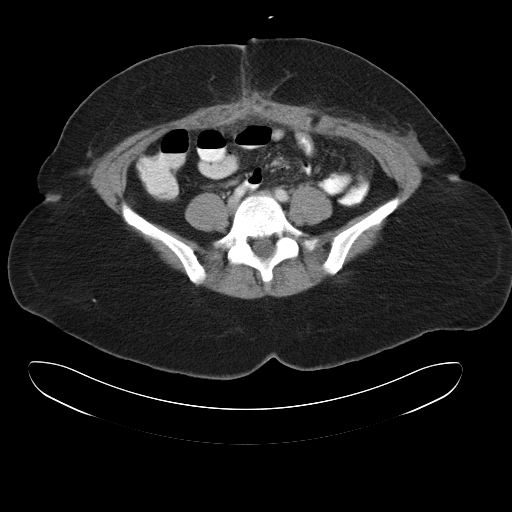
[im 28/59  soft-tissue]
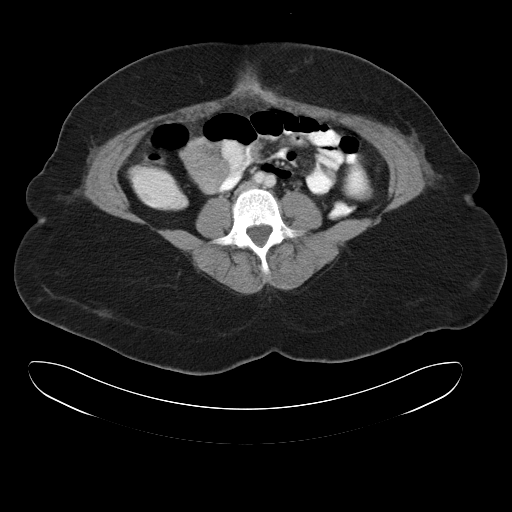
[im 33/59  soft-tissue]
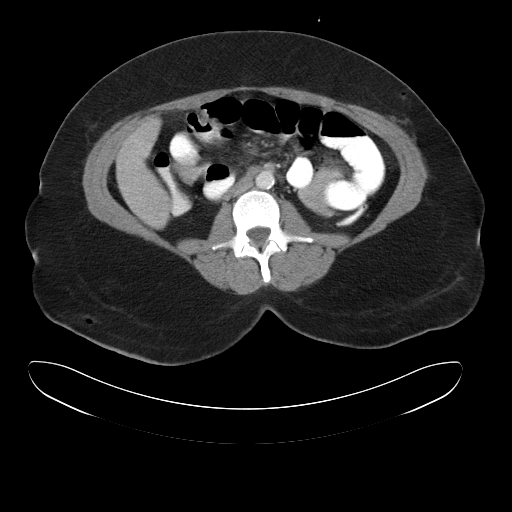
[im 38/59  soft-tissue]
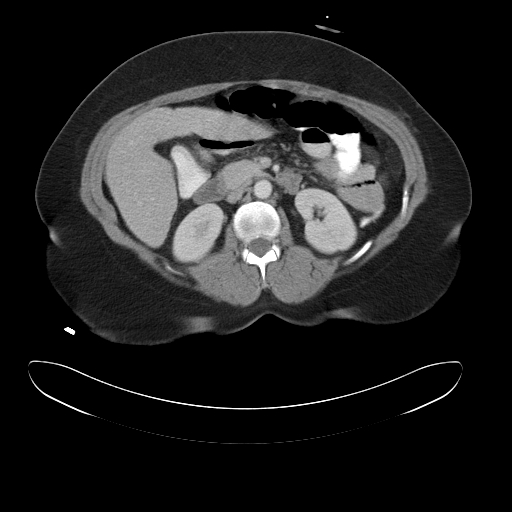
[im 42/59  soft-tissue]
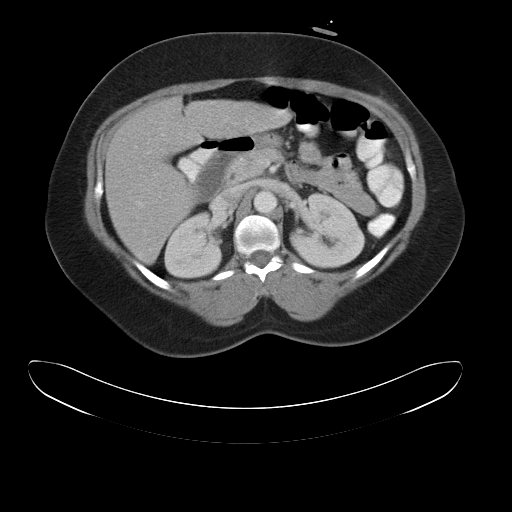
[im 42/59  bone]
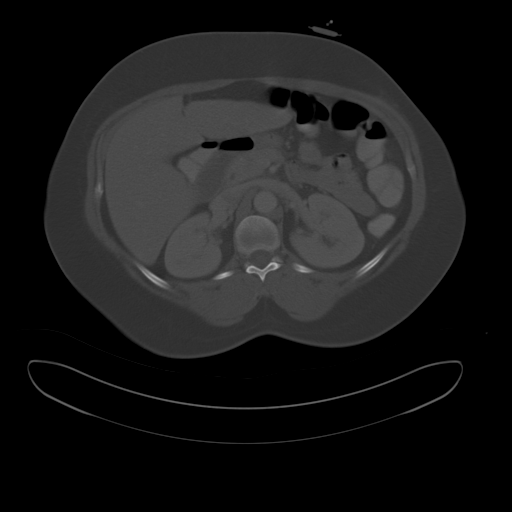
[im 47/59  soft-tissue]
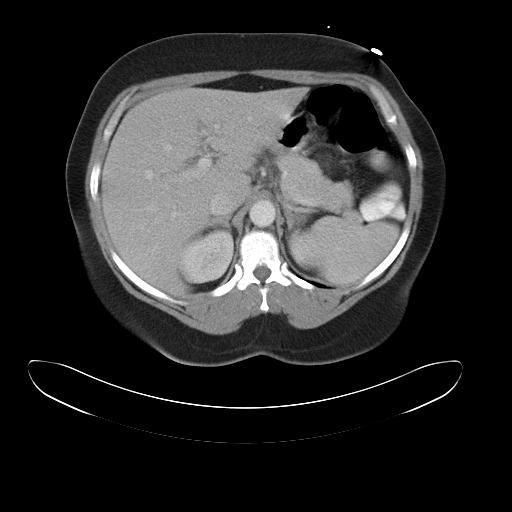
[im 49/59  lung]
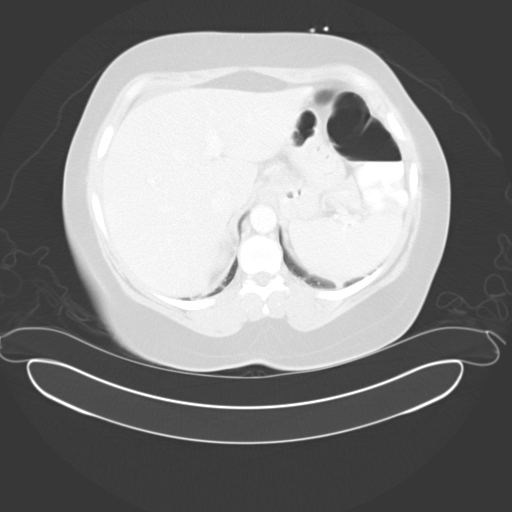
[im 52/59  soft-tissue]
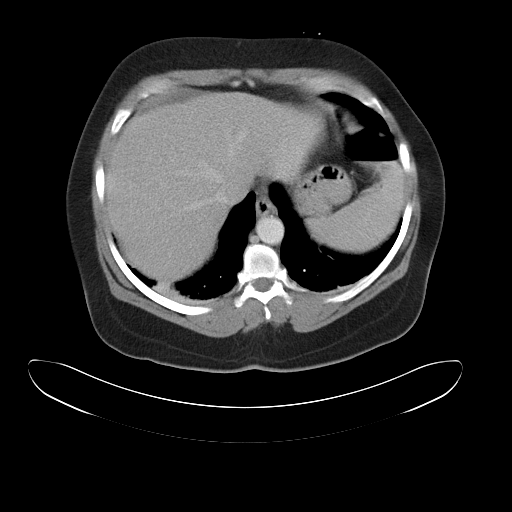
[im 52/59  lung]
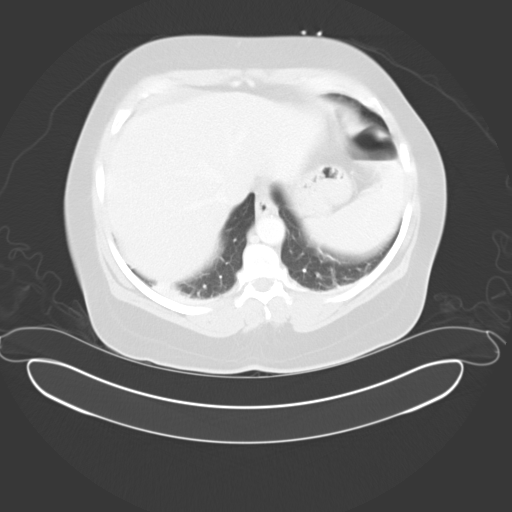
[im 54/59  lung]
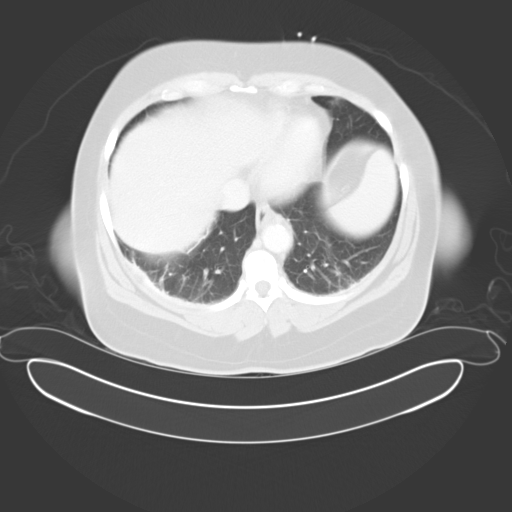
[im 56/59  soft-tissue]
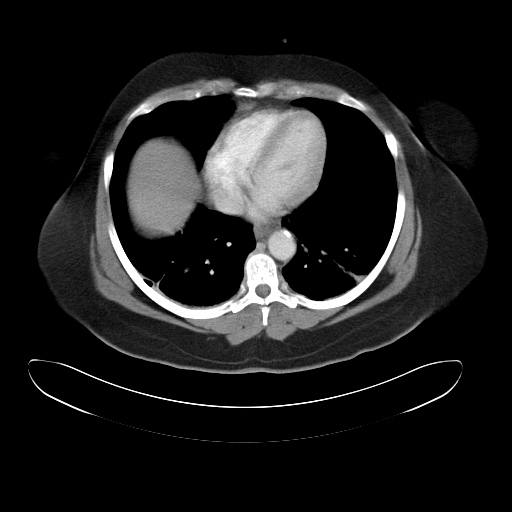
[im 56/59  lung]
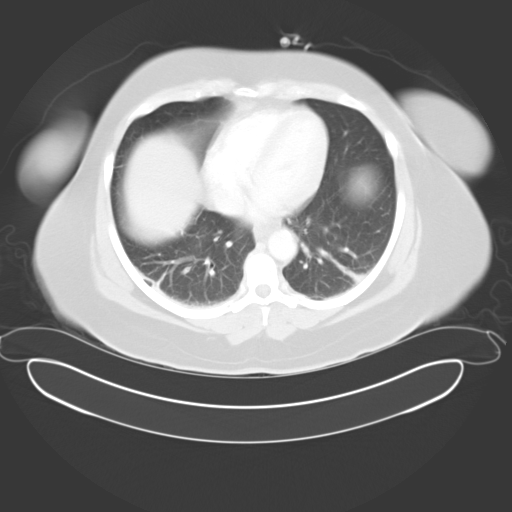

[14 of 32 positions shown; findings below may reference images not displayed]

PROCEDURE:     CT  - CT ABDOMEN / PELVIS  W  - January 16, 2005  [DATE]

RESULT:     Spiral 8.0 mm sections were obtained from the lung bases through
the pubic symphysis status post intravenous administration of 100 cc's of
Fsovue-NMN and oral contrast.

Evaluation of the lung bases demonstrates a linear area of increased density
within the LEFT lung base and to a lesser extent the RIGHT lung base. These
areas likely represent atelectasis and/or scar.

The liver, spleen, RIGHT adrenal, pancreas and kidneys are unremarkable.
Evaluation of the LEFT adrenal demonstrates a nodular density along the
lateral lower pole of the LEFT adrenal measuring 2.0 x 2.0 cm. Differential
considerations are possible adrenal adenoma though more ominous etiologies
cannot be excluded. This can be further evaluated with an adrenal protocol
MRI, if clinically warranted. Otherwise, there is no evidence of further
abdominal masses. Fluid is demonstrated within the pelvis. This appears to
be free fluid. No drainable loculated fluid collections are identified. The
patient does have a history of recent surgical procedure and the fluid may
be post surgical. Pockets of air are demonstrated also consistent with the
patient's history of recent surgery. An ostomy site is demonstrated within
the LEFT lower anterior abdominal wall. The site appears unremarkable
without evidence of surrounding fluid or drainable loculated fluid
collections. Mild inflammatory change is demonstrated within the
subcutaneous fat which would be expected.
IMPRESSION: 1.     Free fluid within the pelvis. Otherwise, there is no evidence of
drainable loculated fluid collections.
2.     Pockets of foci of air are also demonstrated within the pelvis
indicative of recent surgery.
3.     LEFT adrenal mass as described above. Further evaluation with adrenal
protocol MRI is recommended, if clinically warranted.
4.     Atelectasis versus scarring within the lung bases.
5.     No drainable loculated fluid collections are demonstrated within the
abdomen or pelvis.
6.     The study is compared to a previous study dated 08/16/2003 and the
ascites throughout the abdomen and pelvis has markedly decrease in size.

## 2007-01-23 IMAGING — CR DG ABDOMEN 2V
1 series · 2 of 2 positions shown · non-contrast
Comparison: none

REASON FOR EXAM: Abd pain
COMMENTS:

[Series 1: view not recorded · 0.17mm/px · 2 of 2 slices shown]
[im 1/2]
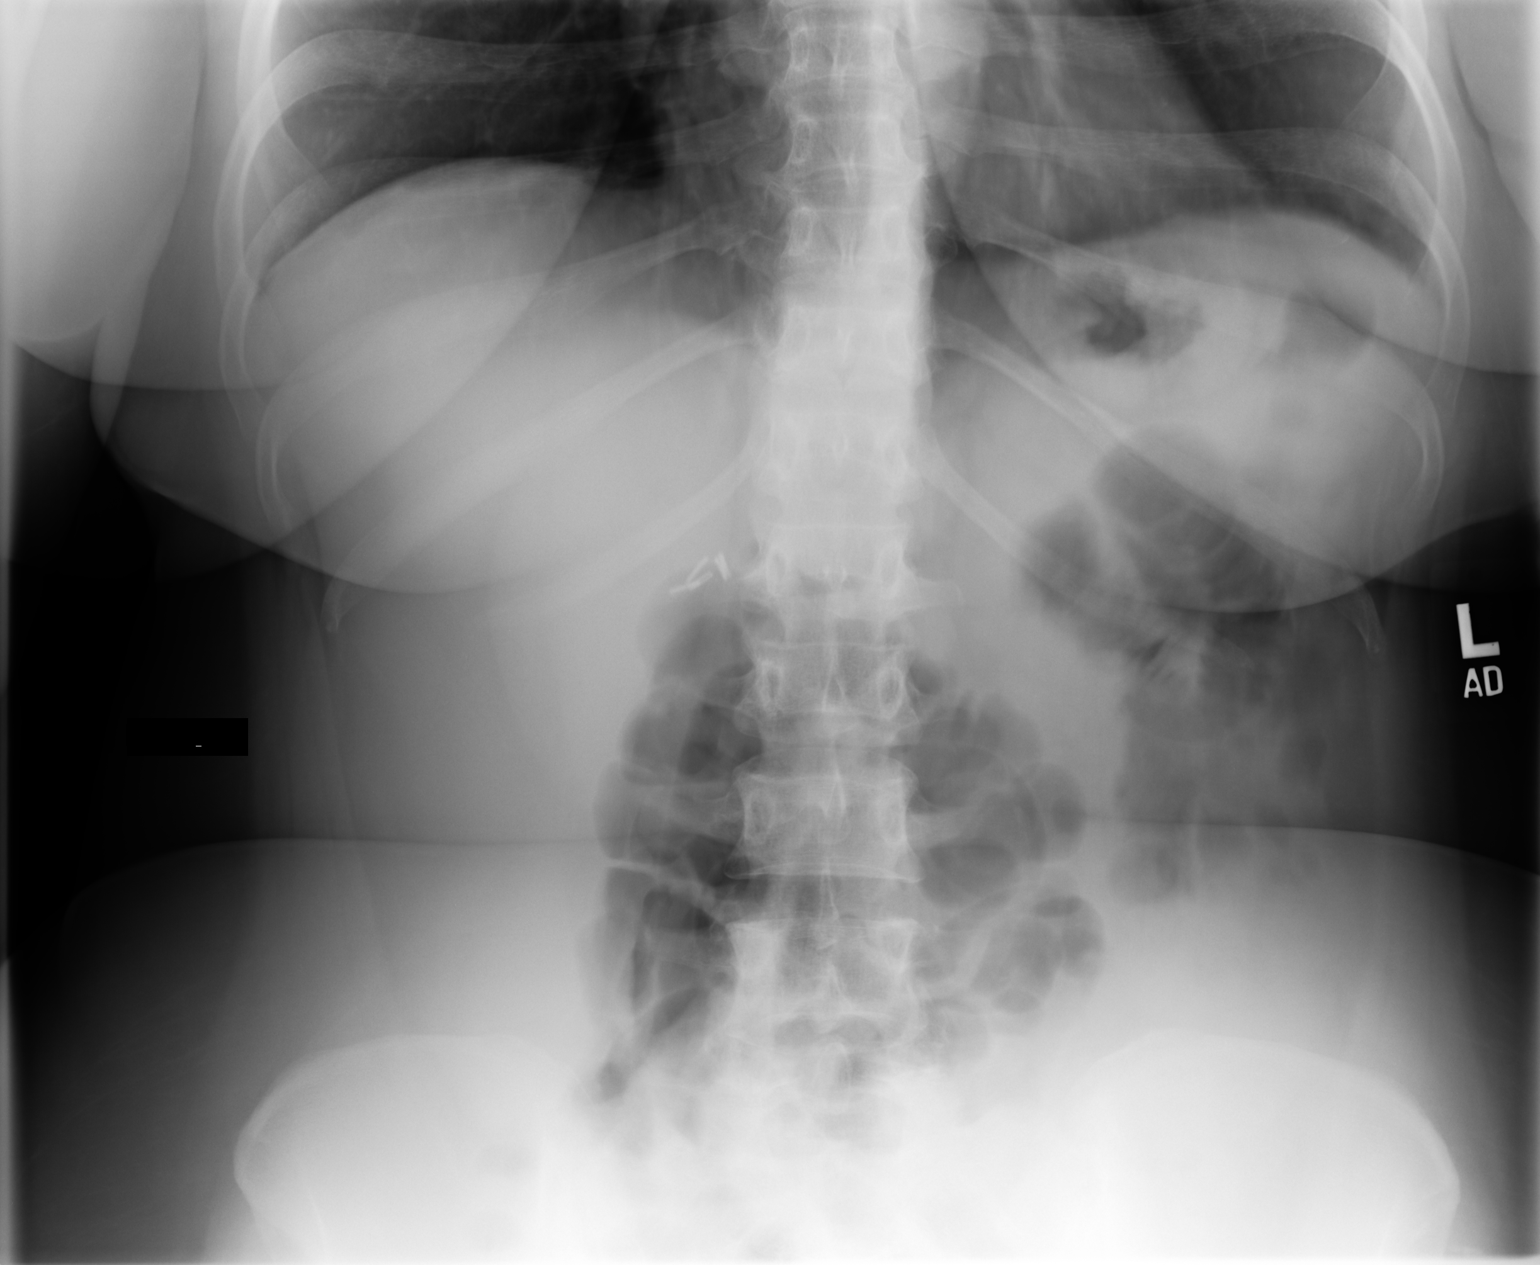
[im 2/2]
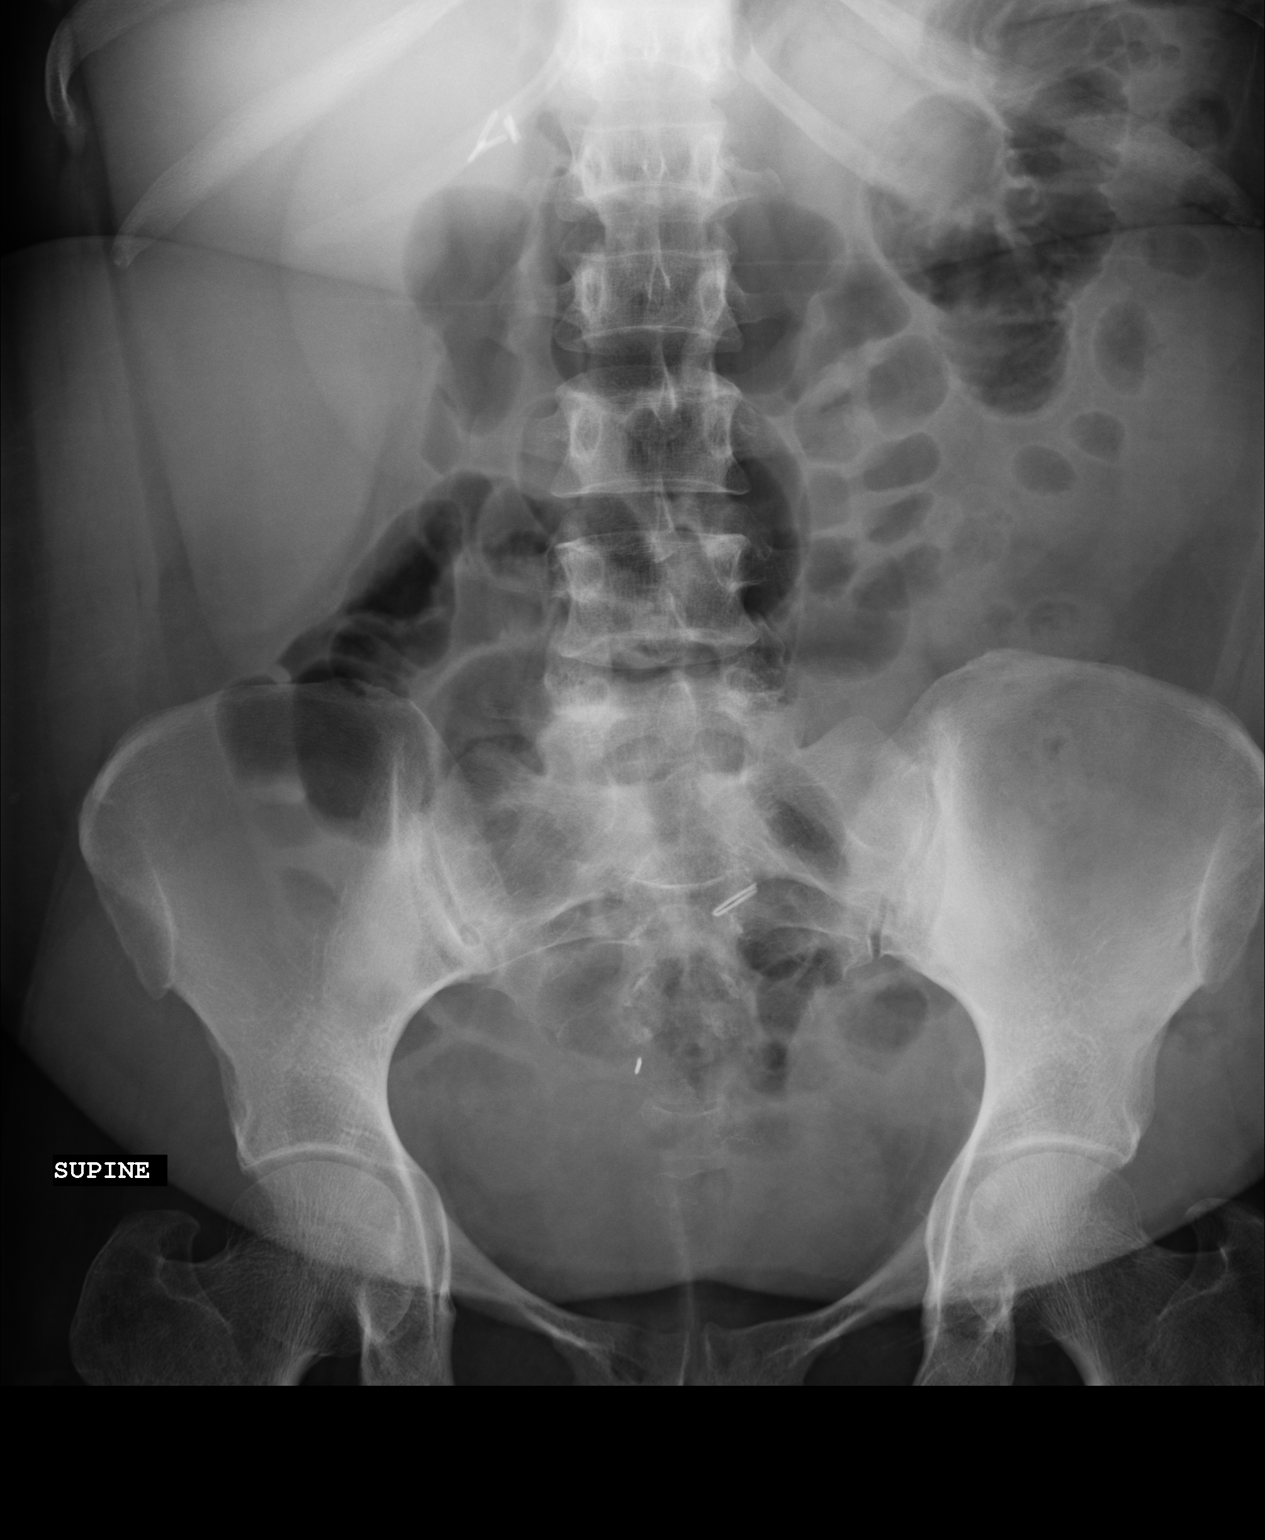

[2 of 2 positions shown; findings below may reference images not displayed]

PROCEDURE:     DXR - DXR ABDOMEN 2 V FLAT AND ERECT  - February 12, 2005  [DATE]

RESULT:     Flat and erect views of the abdomen were obtained. No
subdiaphragmatic free air is seen. The bowel gas pattern shows no specific
abnormalities. Gas is visualized in both large and small bowel. No air fluid
levels are seen in the erect view.  Postoperative metallic clips are noted
in the upper mid pelvis and in the region of the gallbladder bed. No acute
bony abnormalities are noted. No abnormal intraabdominal calcifications are
identified.
IMPRESSION: 1)No significant abnormalities are identified.

## 2007-01-25 IMAGING — CT CT ABD-PELV W/ CM
1 of 2 series · 15 of 32 positions shown, 19 images · non-contrast
Comparison: none

REASON FOR EXAM: (1) abd pain; (2) SAME
COMMENTS:

PROCEDURE:     CT  - CT ABDOMEN / PELVIS  W  - February 14, 2005 [DATE]
RESULT:
REASON FOR CONSULTATION: Abdominal pain.
TECHNIQUE: Axial enhanced images with oral and intravenous were obtained
from the hemidiaphragms to the pubic symphysis.

[Series 2: soft tissue · axial · 0.78mm/px · z∈[-690,-284]mm · 15 of 92 slices shown, 19 images]
[im 7/92  soft-tissue]
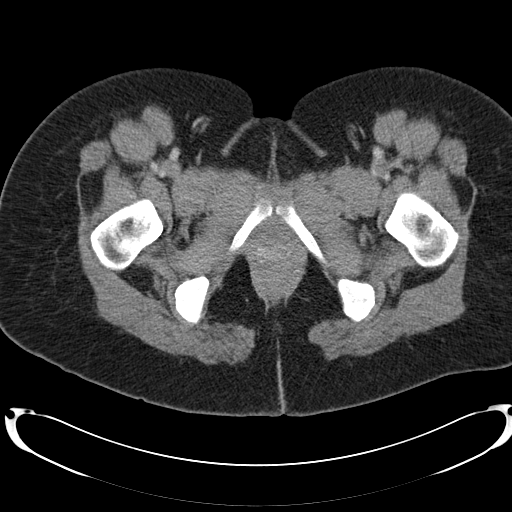
[im 7/92  bone]
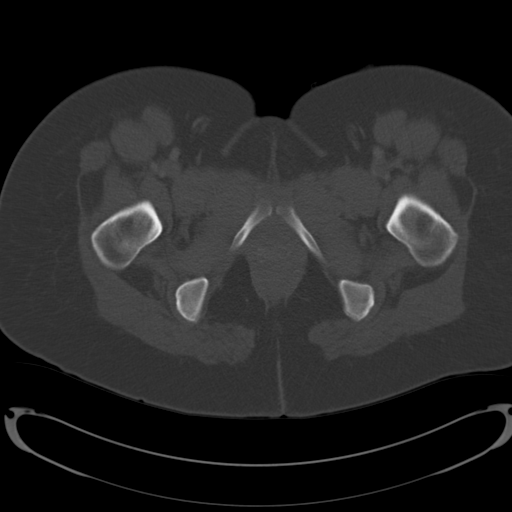
[im 14/92  soft-tissue]
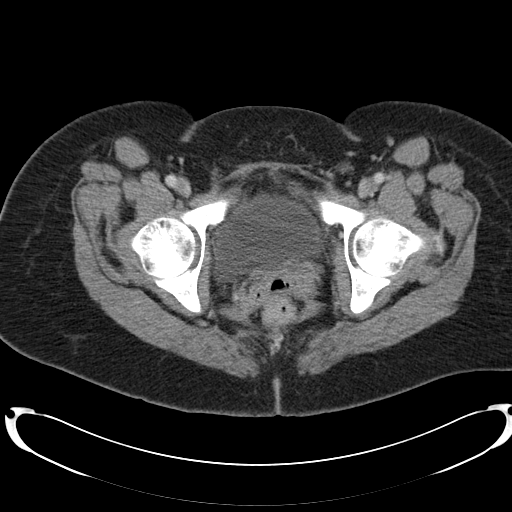
[im 20/92  soft-tissue]
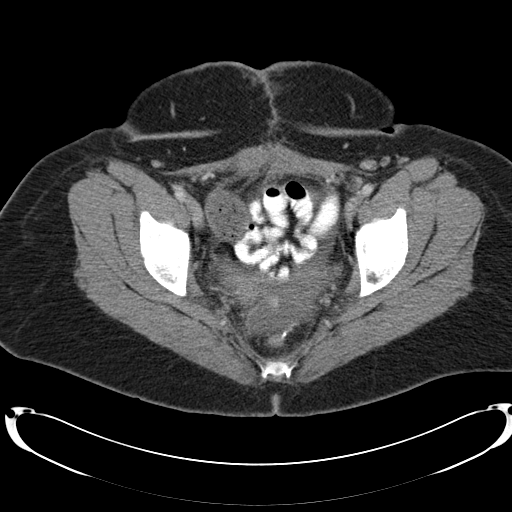
[im 27/92  soft-tissue]
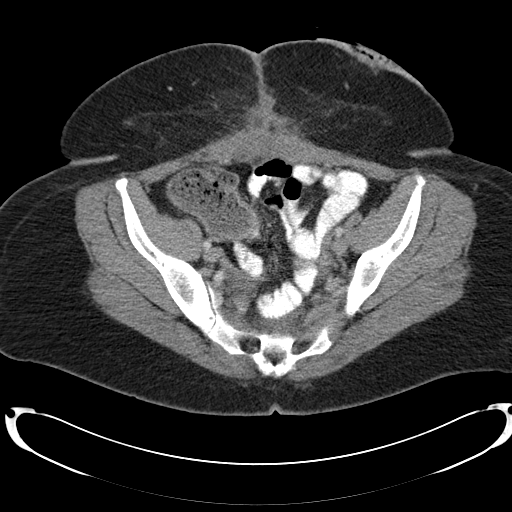
[im 33/92  soft-tissue]
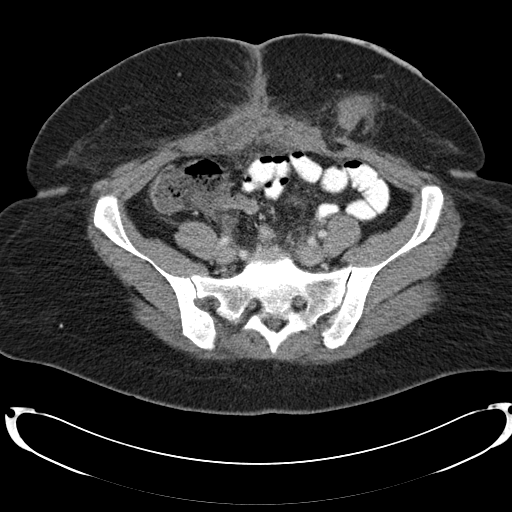
[im 40/92  soft-tissue]
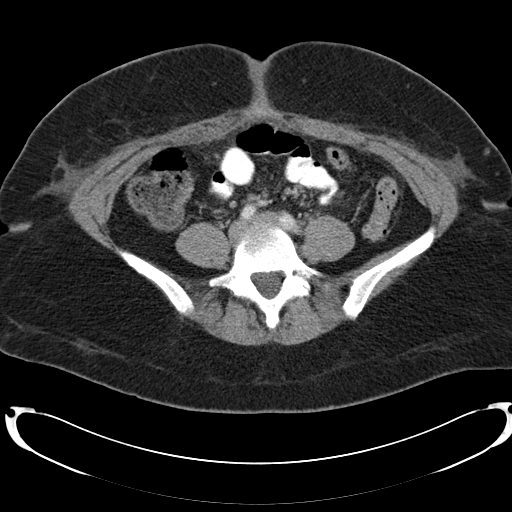
[im 46/92  soft-tissue]
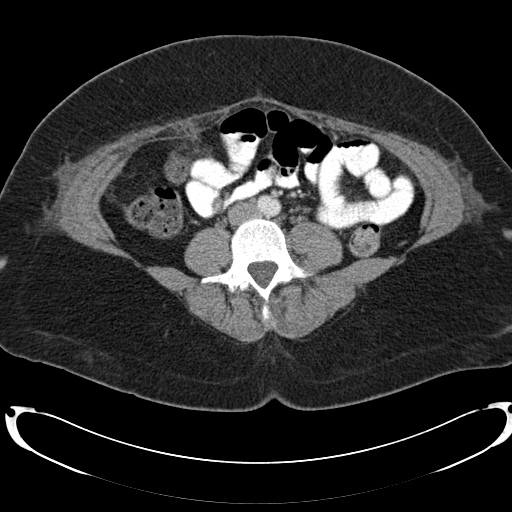
[im 53/92  soft-tissue]
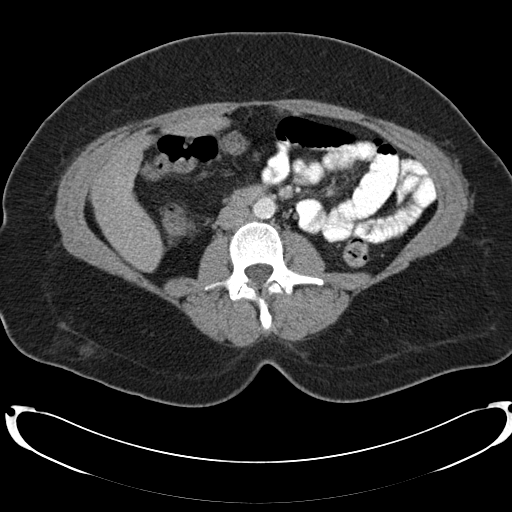
[im 59/92  soft-tissue]
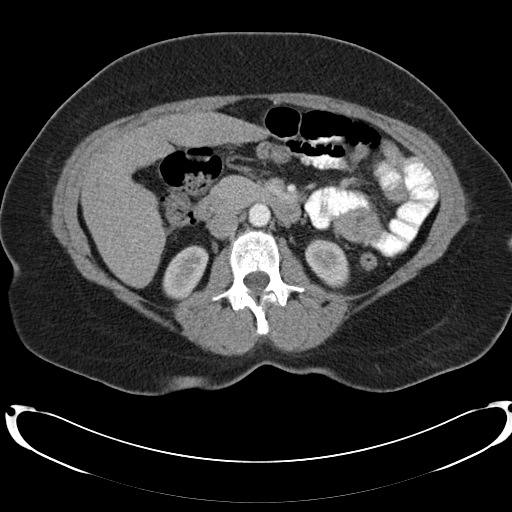
[im 59/92  bone]
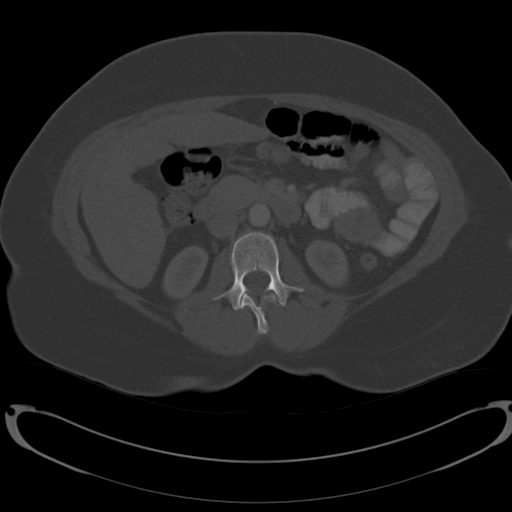
[im 66/92  soft-tissue]
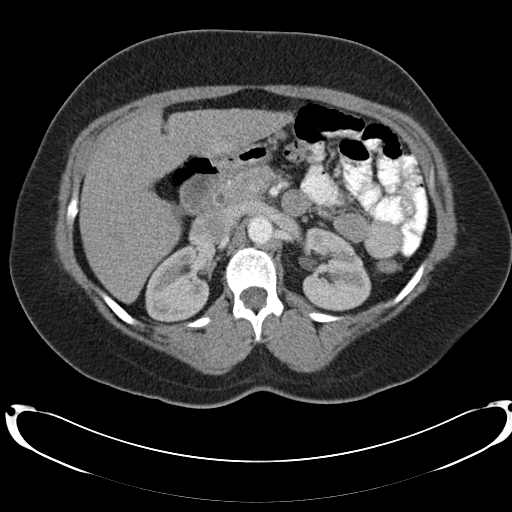
[im 72/92  soft-tissue]
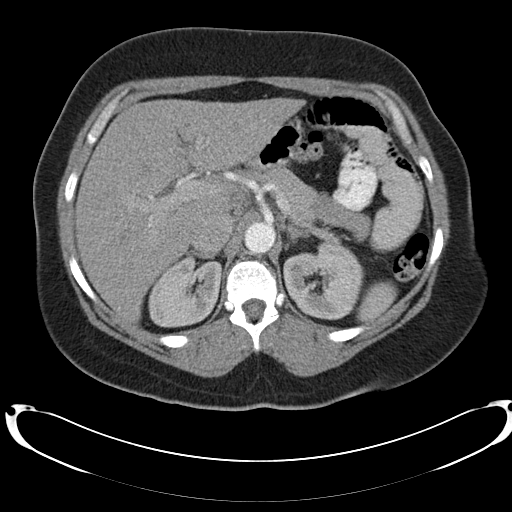
[im 79/92  soft-tissue]
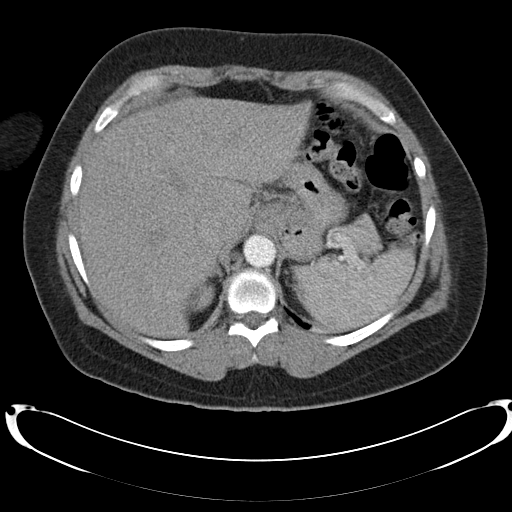
[im 79/92  lung]
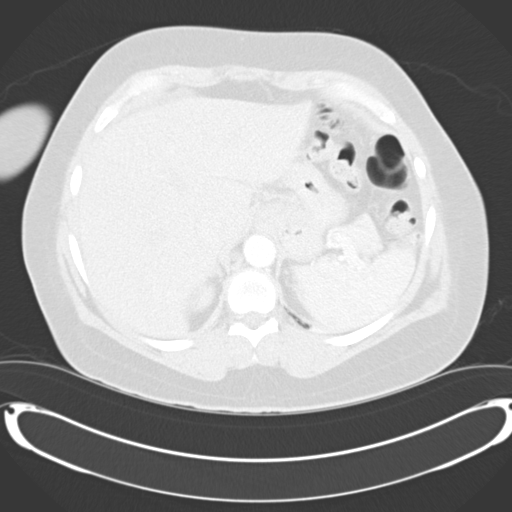
[im 82/92  lung]
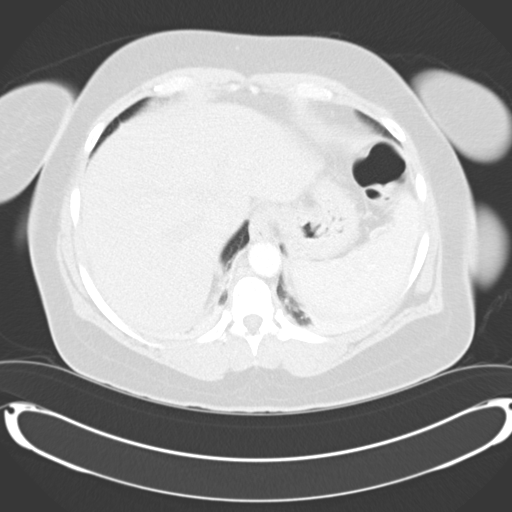
[im 85/92  soft-tissue]
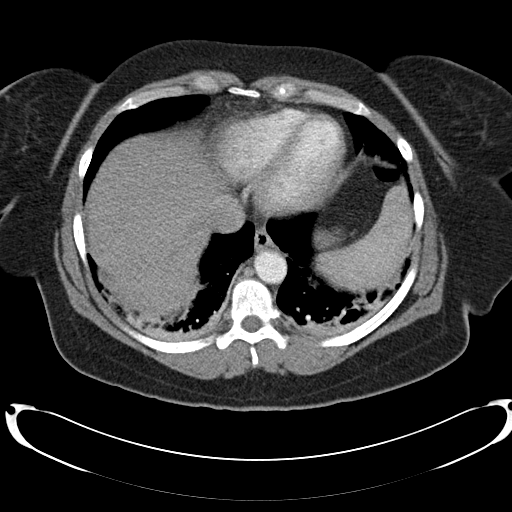
[im 85/92  lung]
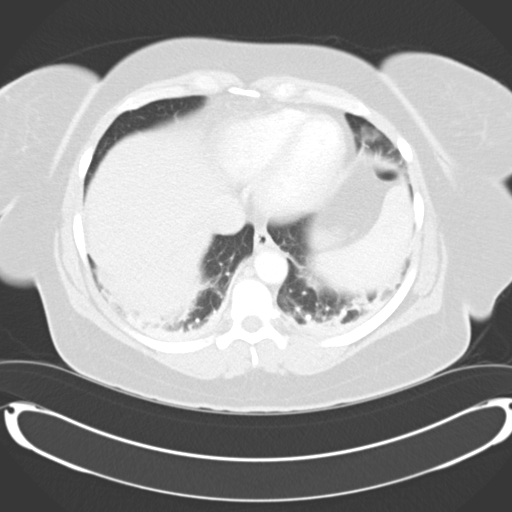
[im 88/92  lung]
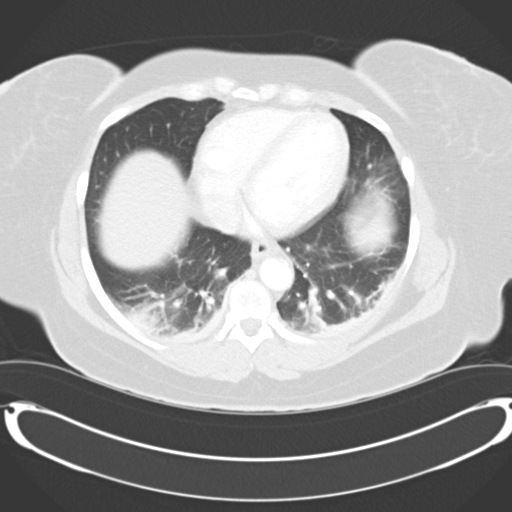

[15 of 32 positions shown; findings below may reference images not displayed]

FINDINGS: The liver and spleen appear intact. Both kidneys excrete the
contrast material with no obvious masses seen. No hydronephrosis is
identified.

The pancreas is visualized with no focal masses noted. No fluid is noted
within the abdomen. There is noted an ostomy in the LEFT lower quadrant in
the mid pelvis region.  There appears some small amount of fluid in the
cul-de-sac region. There appears some minimal fluid in the soft tissues just
anterior to the lateral oblique level of the pelvis. This may be secondary
to some recent surgery and should be correlated clinically. There is noted
atelectasis on the images through the lung bases.  No effusions are seen.
IMPRESSION: 1)Ostomy in the LEFT lower quadrant.  There is a small amount of fluid in
the cul-de-sac region.  Some fluid is identified anterior to the oblique
muscles at the level of the pelvis in subcutaneous tissues and can be
secondary to recent surgery.

2)No fluid is noted within the abdomen.

3)There is seen atelectasis in the lung bases.

## 2007-02-07 IMAGING — CT CT ABD-PELV W/ CM
1 of 3 series · 14 of 32 positions shown, 19 images · non-contrast
Comparison: none

REASON FOR EXAM: Rectal bleeding, patient has colostomy
COMMENTS:

[Series 2: abdomen · axial · 0.73mm/px · z∈[+266,+676]mm · 14 of 92 slices shown, 19 images]
[im 5/92  soft-tissue]
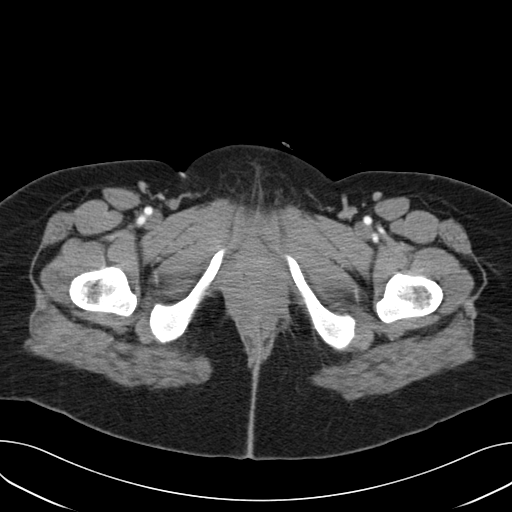
[im 5/92  bone]
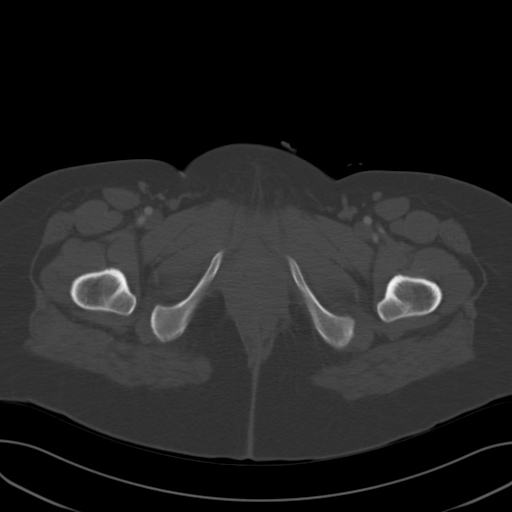
[im 15/92  soft-tissue]
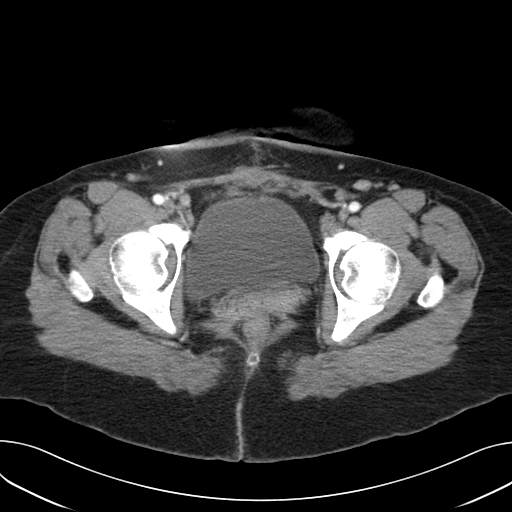
[im 20/92  soft-tissue]
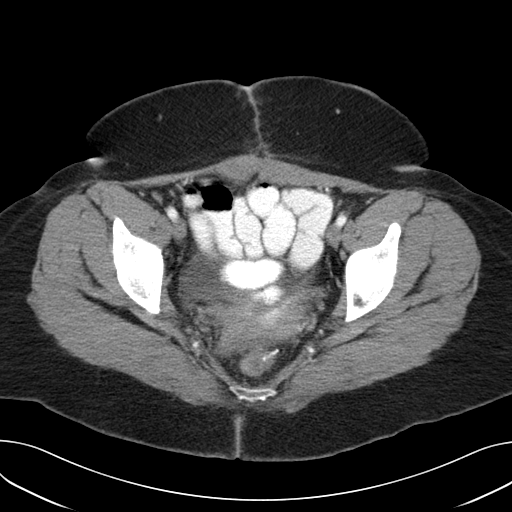
[im 24/92  soft-tissue]
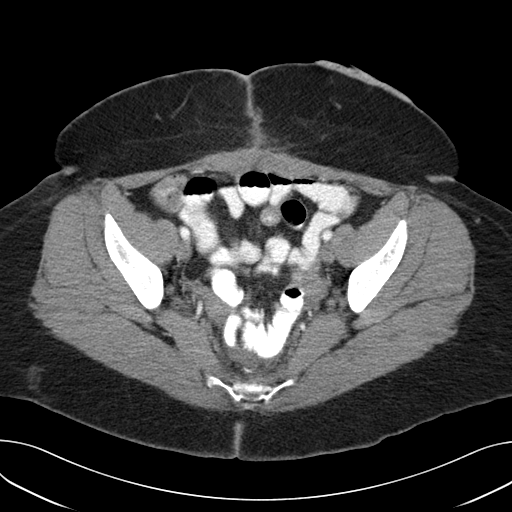
[im 34/92  soft-tissue]
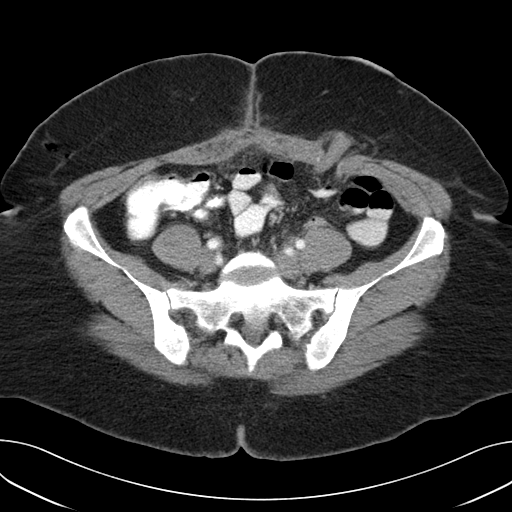
[im 39/92  soft-tissue]
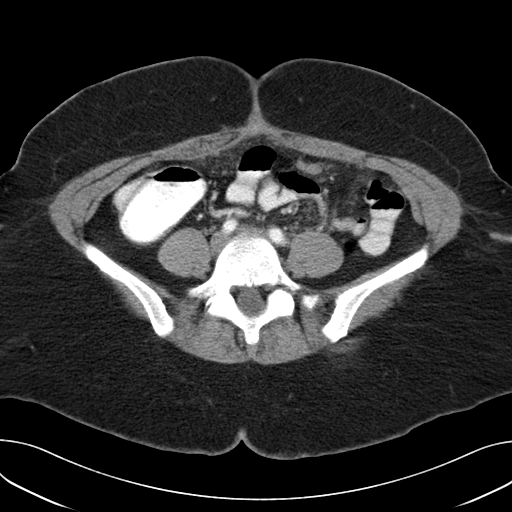
[im 48/92  soft-tissue]
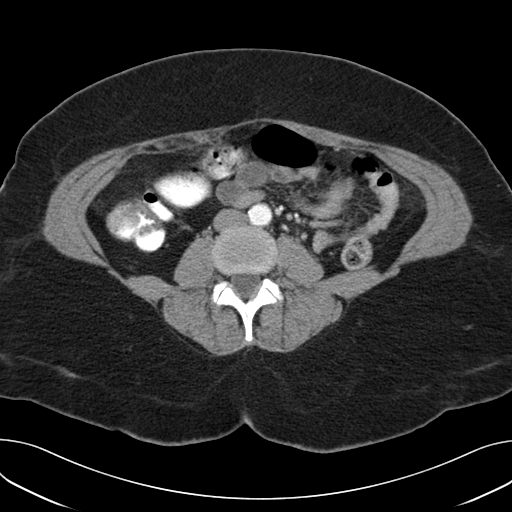
[im 53/92  soft-tissue]
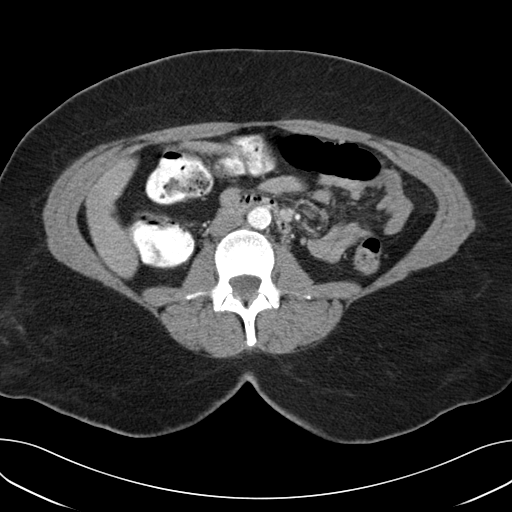
[im 58/92  soft-tissue]
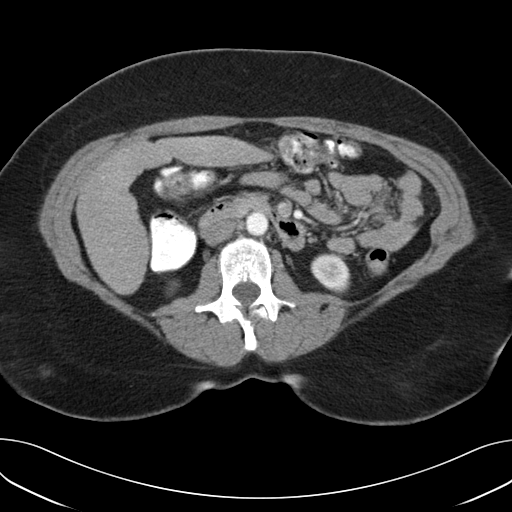
[im 58/92  bone]
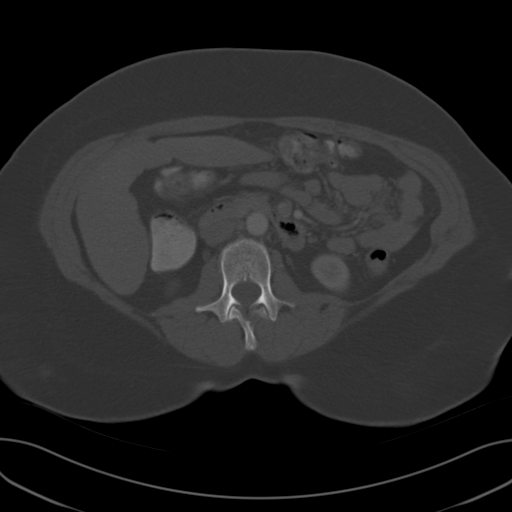
[im 68/92  soft-tissue]
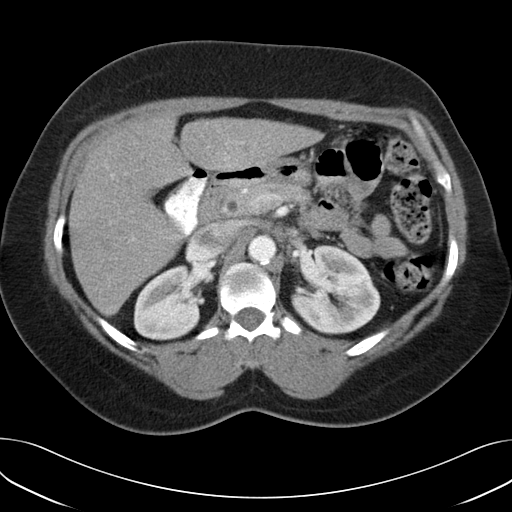
[im 72/92  soft-tissue]
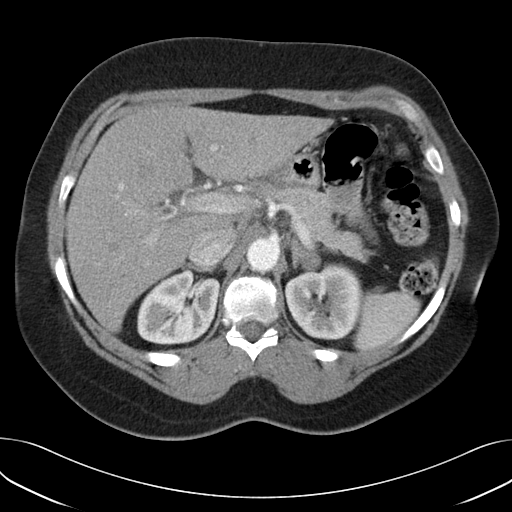
[im 72/92  lung]
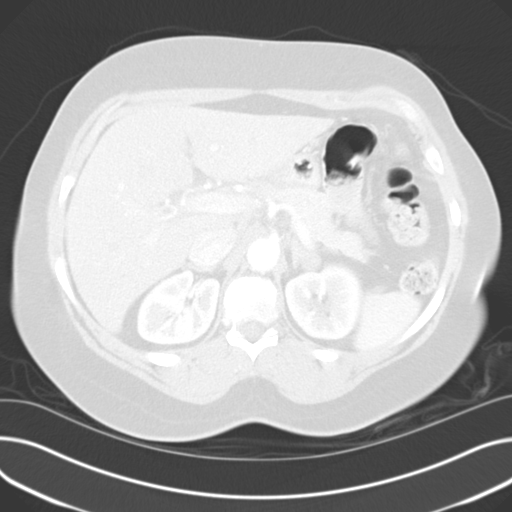
[im 77/92  soft-tissue]
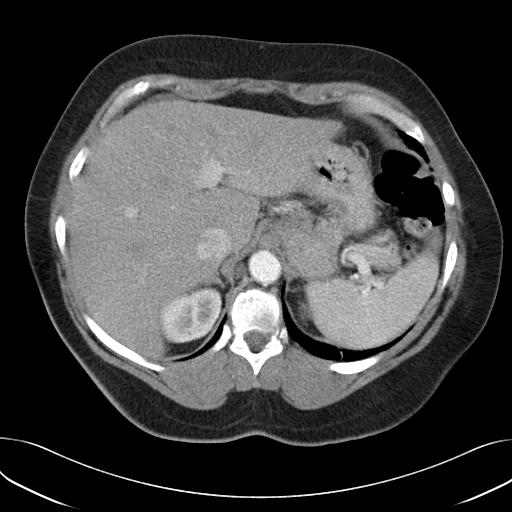
[im 77/92  lung]
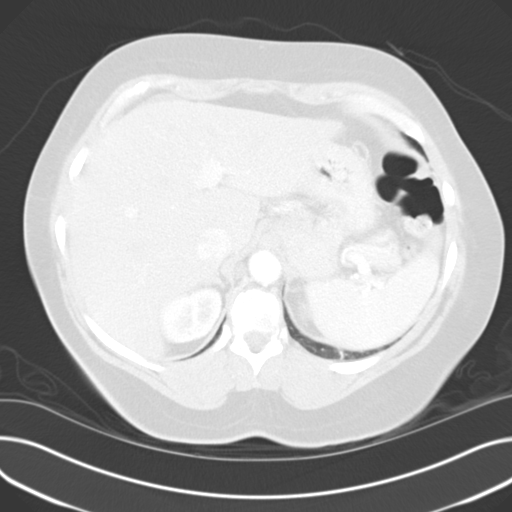
[im 82/92  lung]
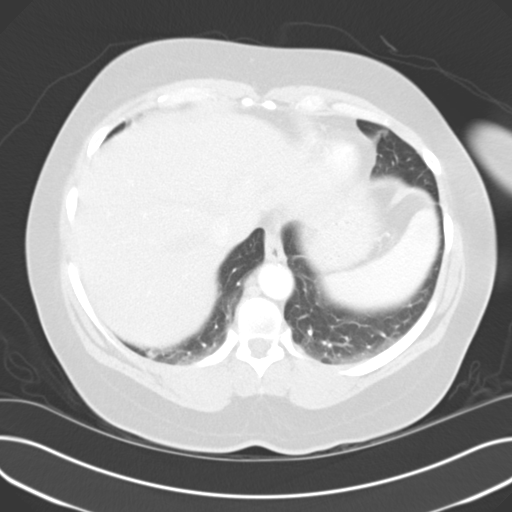
[im 87/92  soft-tissue]
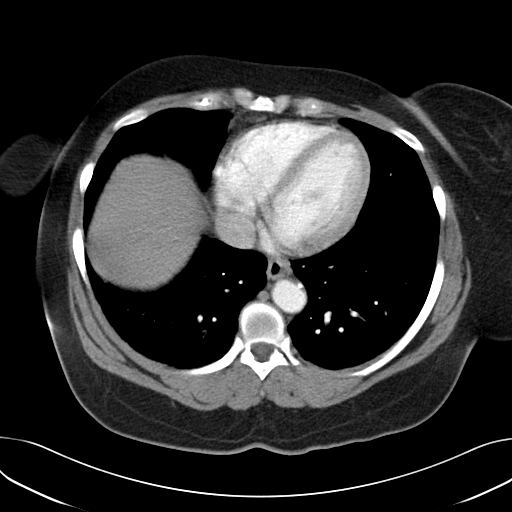
[im 87/92  lung]
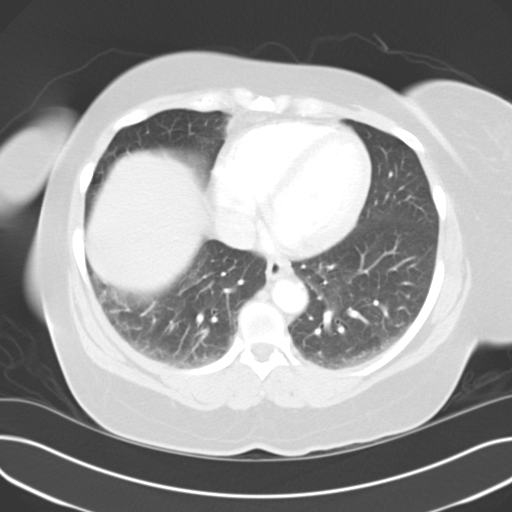

[14 of 32 positions shown; findings below may reference images not displayed]

PROCEDURE:     CT  - CT ABDOMEN / PELVIS  W  - February 27, 2005 [DATE]

RESULT:     Standard IV and oral contrast enhanced CT of the abdomen and
pelvis is obtained. The liver and spleen are normal. Surgical clips are
noted in the gallbladder fossa.  Stable LEFT adrenal fullness is present,
most consistent with an adrenal adenoma. The pancreas is unremarkable. No
focal renal abnormalities are identified.  There is no hydronephrosis. There
is no bowel distention. The patient has a LEFT lower quadrant ostomy.
Rectal pouch contrast was administered and reveals no focal rectal lesion.
Subtle amount of fluid in the pelvis versus pelvic fat plane thickening from
prior surgery is present. Again, no rectal mass is noted in this patient
with a history of rectal bleeding. No free air is noted.  There has been
near complete clearing of previously identified basilar atelectasis on prior
scan of 02-14-05.  A rectovaginal fistula is present.
IMPRESSION: 1)LEFT lower quadrant ostomy. No focal bowel lesion is identified. Rectal
contrast was administered and reveals no rectal pouch abnormality. There is
mild fat plane thickening in the pelvis about the rectal pouch and this
could represent a small amount of free fluid in the pelvis or mild scarring
from prior surgery.

2)Probable LEFT adrenal adenoma.

4)There is a rectovaginal fistula present.

## 2007-04-04 IMAGING — CT CT PELVIS W/ CM
1 of 2 series · 14 of 32 positions shown, 20 images · non-contrast
Comparison: none

REASON FOR EXAM: Rectovaginal fistula with rectal contrast
COMMENTS:

[Series 2: soft tissue · axial · 0.86mm/px · z∈[-618,-412]mm · 14 of 49 slices shown, 20 images]
[im 4/49  soft-tissue]
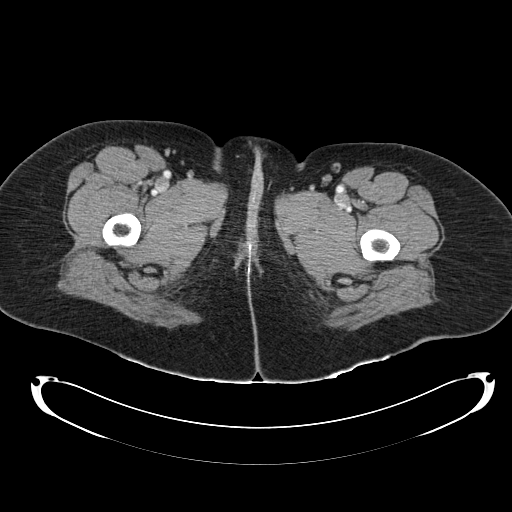
[im 4/49  bone]
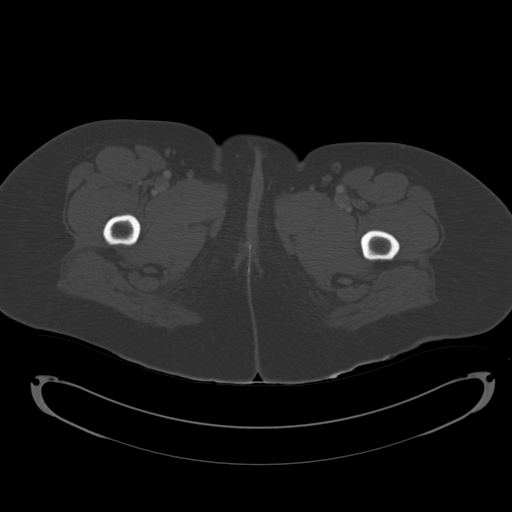
[im 7/49  soft-tissue]
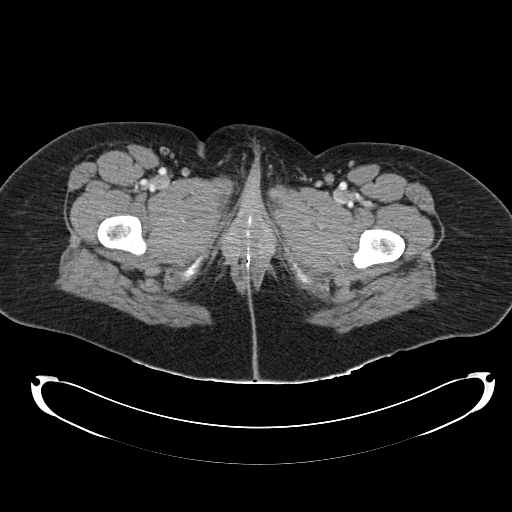
[im 10/49  soft-tissue]
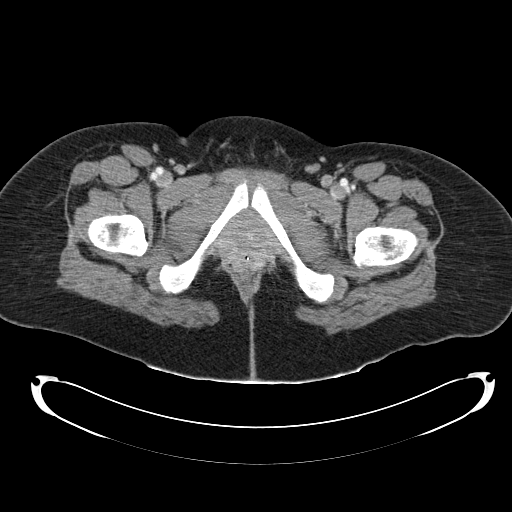
[im 13/49  soft-tissue]
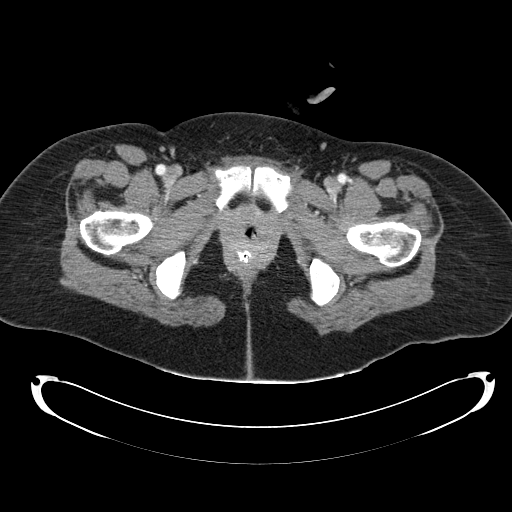
[im 17/49  soft-tissue]
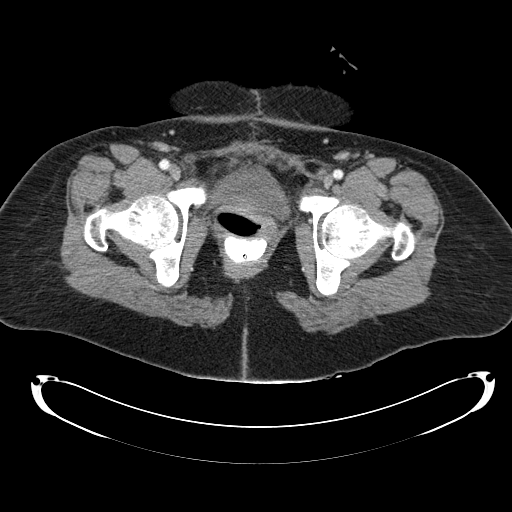
[im 20/49  soft-tissue]
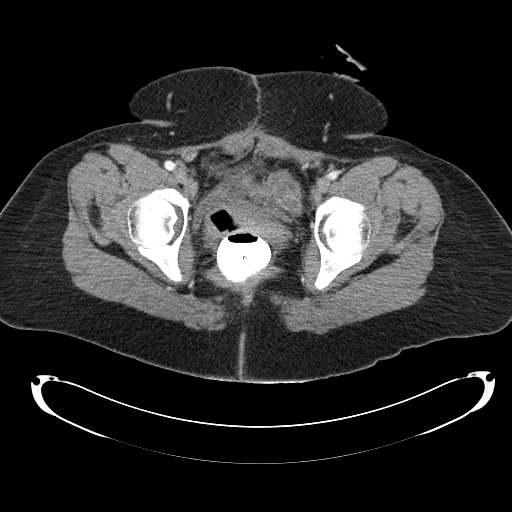
[im 23/49  soft-tissue]
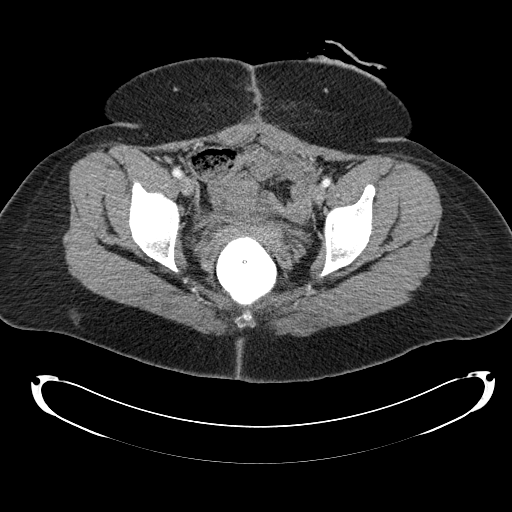
[im 26/49  soft-tissue]
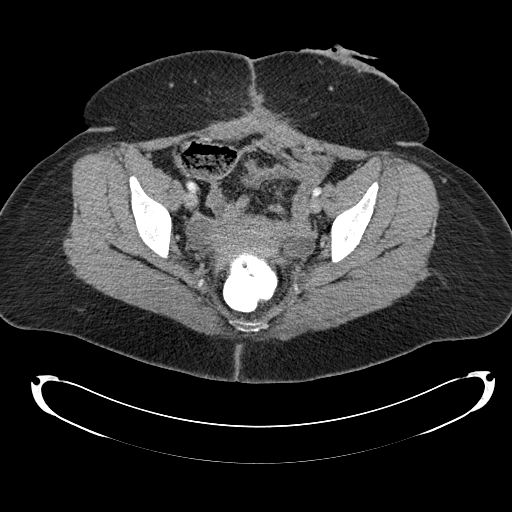
[im 29/49  soft-tissue]
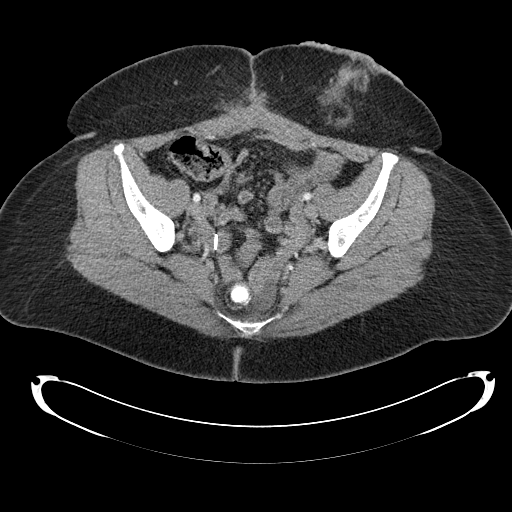
[im 29/49  bone]
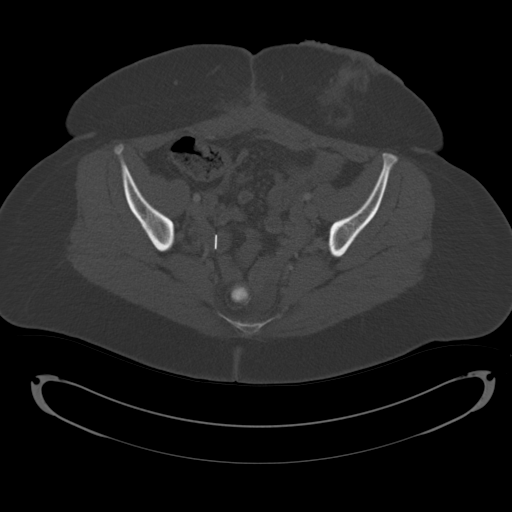
[im 33/49  soft-tissue]
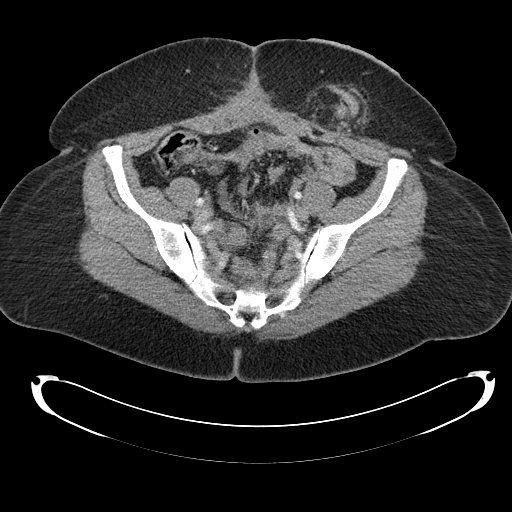
[im 36/49  soft-tissue]
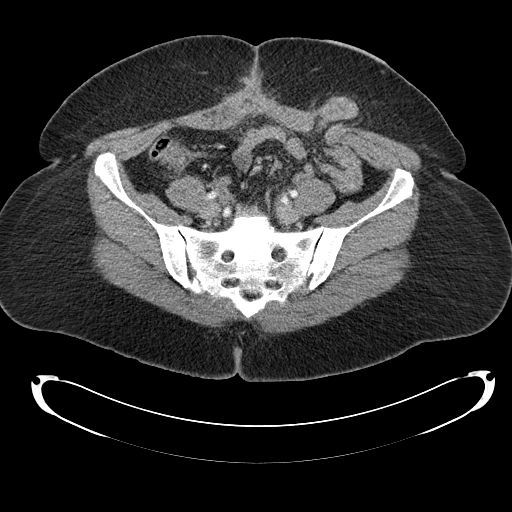
[im 36/49  lung]
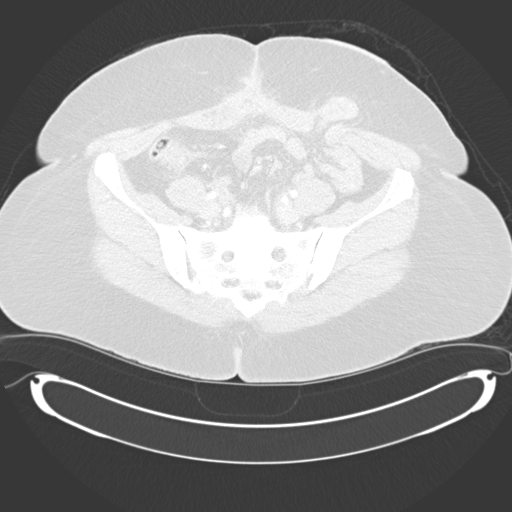
[im 39/49  soft-tissue]
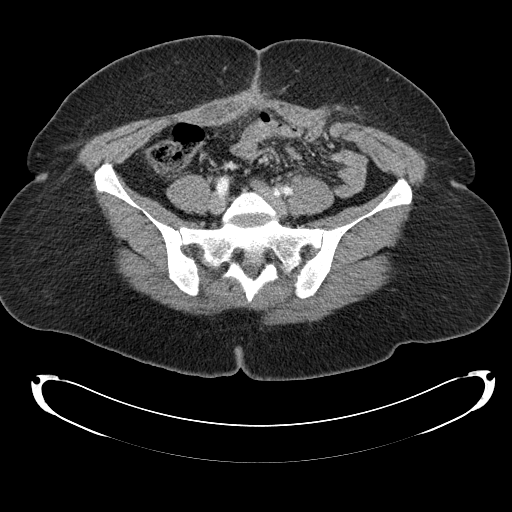
[im 39/49  lung]
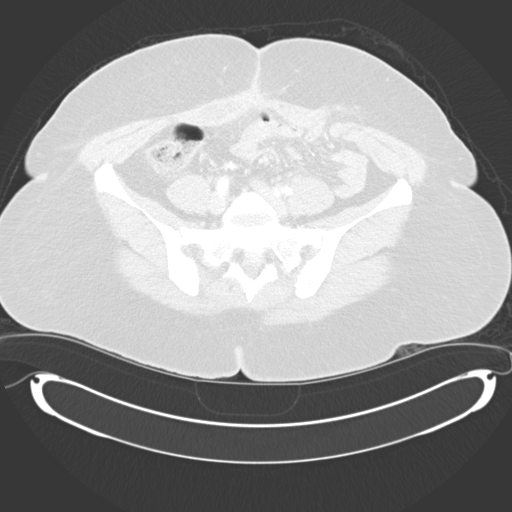
[im 42/49  soft-tissue]
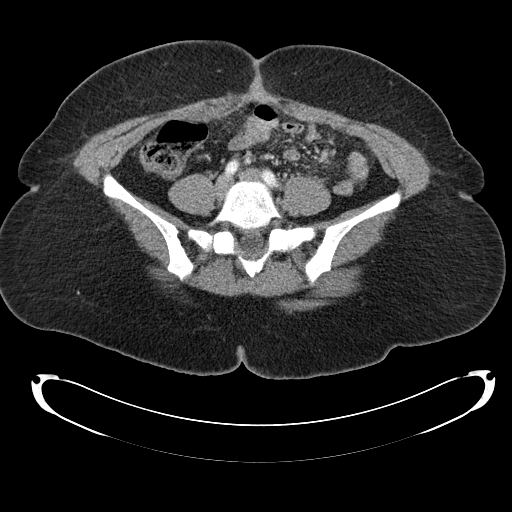
[im 42/49  lung]
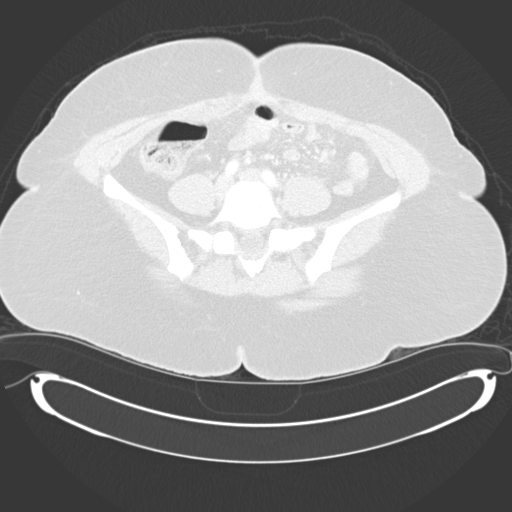
[im 45/49  soft-tissue]
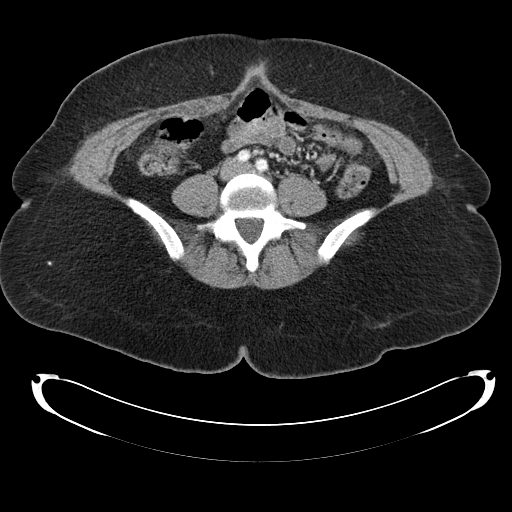
[im 45/49  lung]
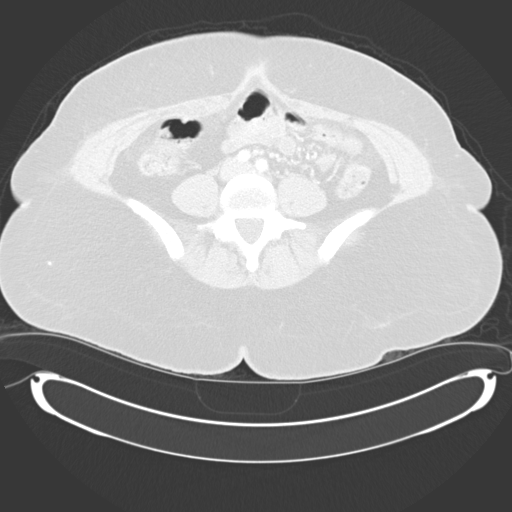

[14 of 32 positions shown; findings below may reference images not displayed]

PROCEDURE:     CT  - CT PELVIS STANDARD W  - April 24, 2005  [DATE]

RESULT:     Rectal and IV contrast-enhanced CT scan of the pelvis is
performed.  A vaginal tampon was placed.  An ostomy is present in the lower
LEFT quadrant.  Surgical clips are present in the pelvis in the RIGHT
adjacent to what appears to be a RIGHT ovarian cyst.  Small LEFT ovarian
cyst is present.  In the lower vaginal region there is a focal density on
the initial studies which does not appear to be as dense on the delayed
images.  This may represent a small posterior fistulous communication with
some barium within the vagina.  The bladder fills with contrast on the
delayed images and shows no definite filling defect.
IMPRESSION: Findings worrisome for a rectovaginal fistula along the inferior posterior
vaginal region.

## 2007-06-12 IMAGING — CT CT HEAD WITHOUT CONTRAST
2 series · 16 of 30 positions shown, 20 images · non-contrast
Comparison: none

REASON FOR EXAM: Seizure, headache   [HOSPITAL]
COMMENTS:

[Series 2: without · axial · non-contrast · 0.39mm/px · z∈[+402,+522]mm · 13 of 28 slices shown, 17 images]
[im 2/28  brain]
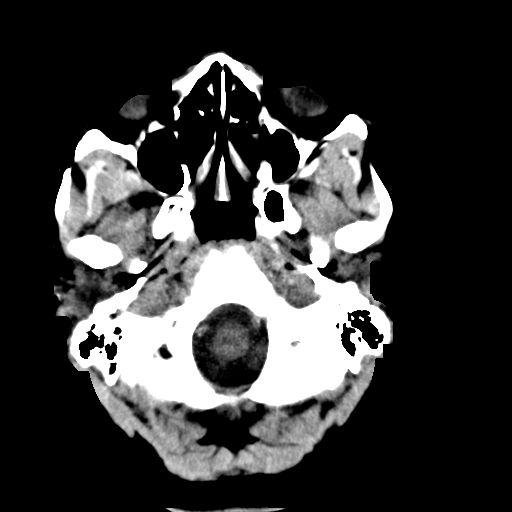
[im 2/28  bone]
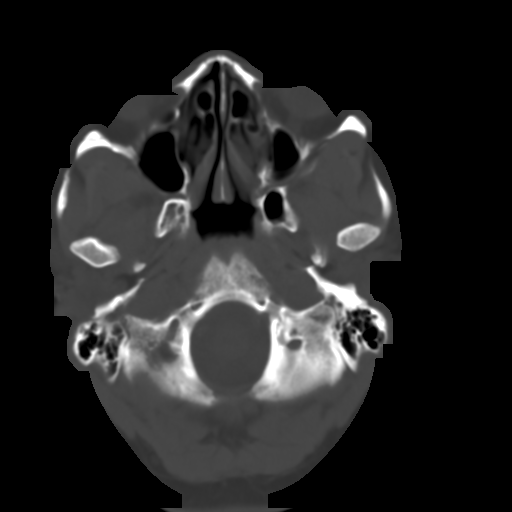
[im 4/28  brain]
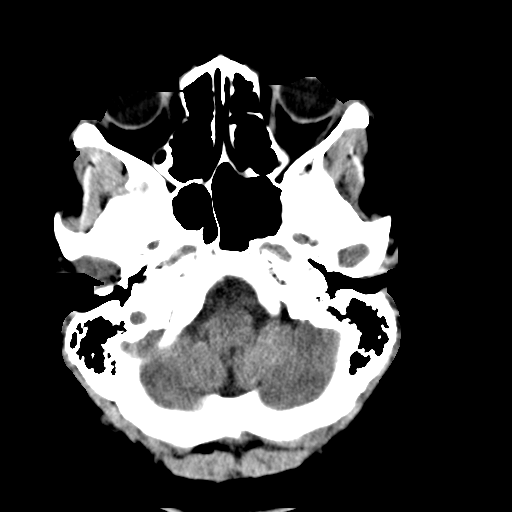
[im 6/28  brain]
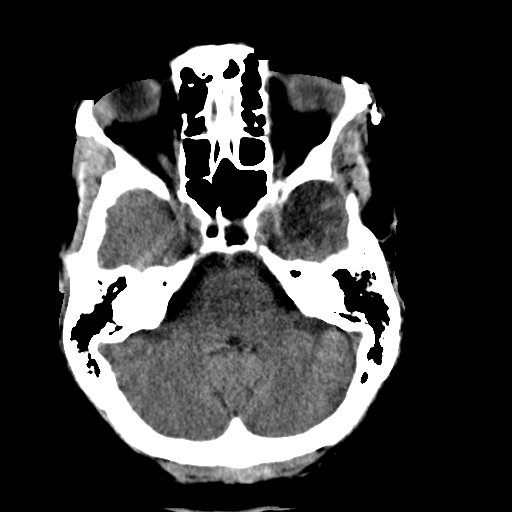
[im 8/28  brain]
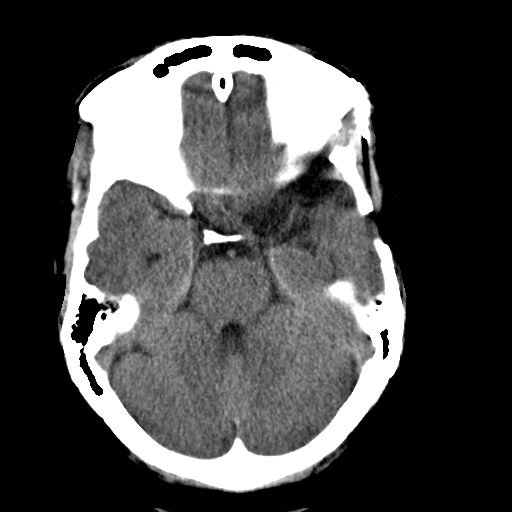
[im 10/28  brain]
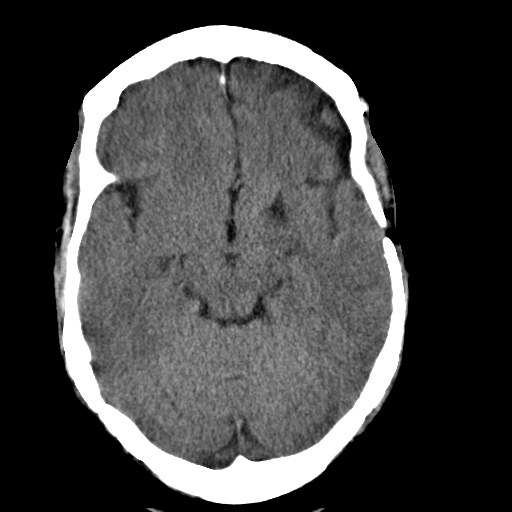
[im 10/28  bone]
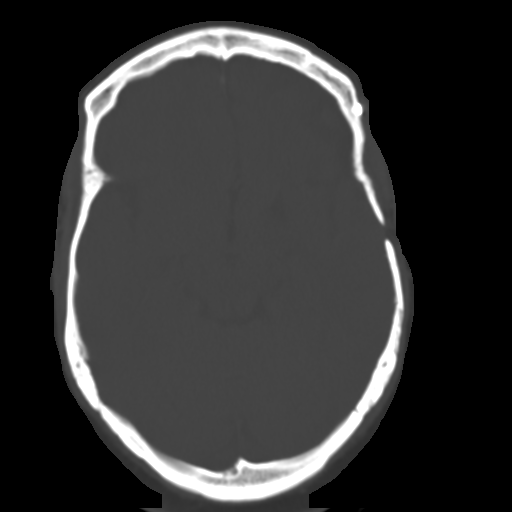
[im 12/28  brain]
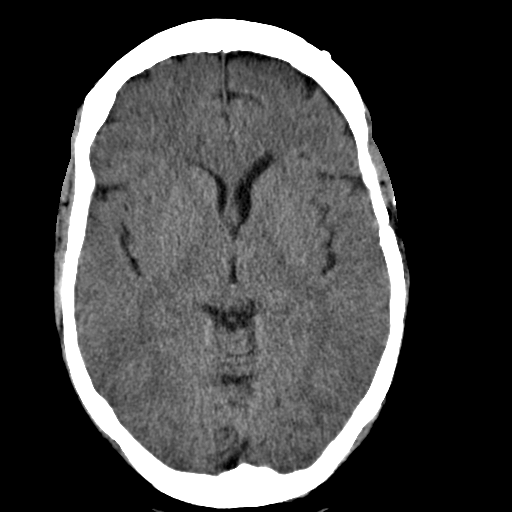
[im 14/28  brain]
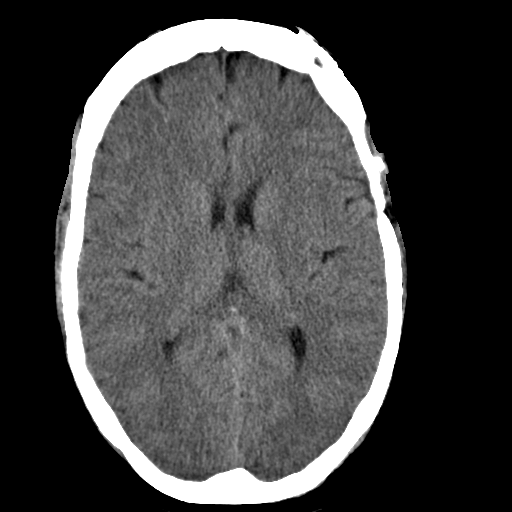
[im 16/28  brain]
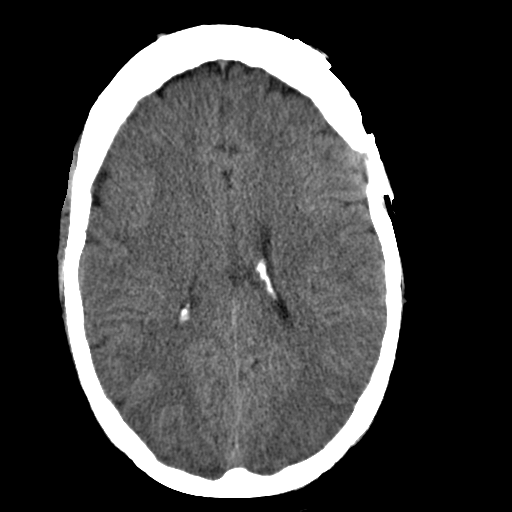
[im 18/28  brain]
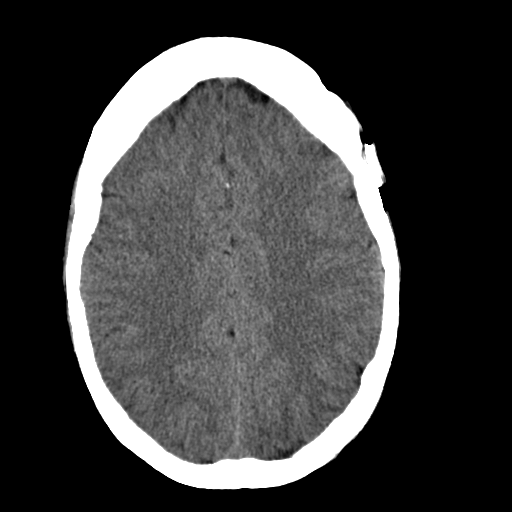
[im 18/28  bone]
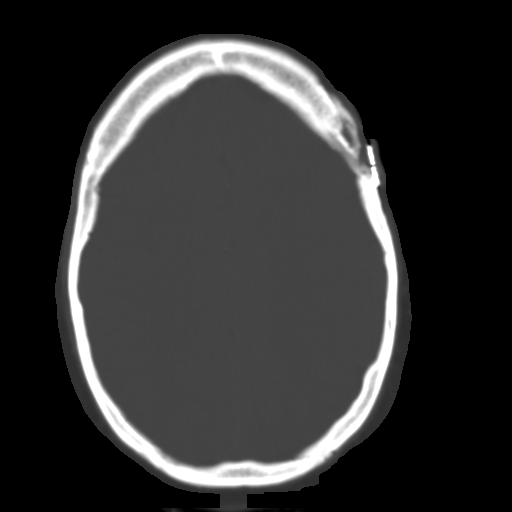
[im 20/28  brain]
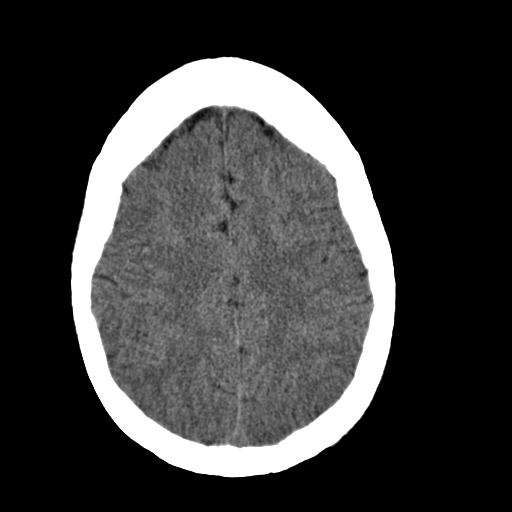
[im 22/28  brain]
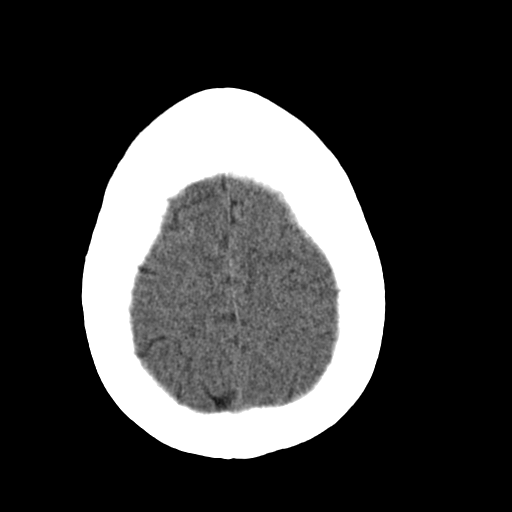
[im 24/28  brain]
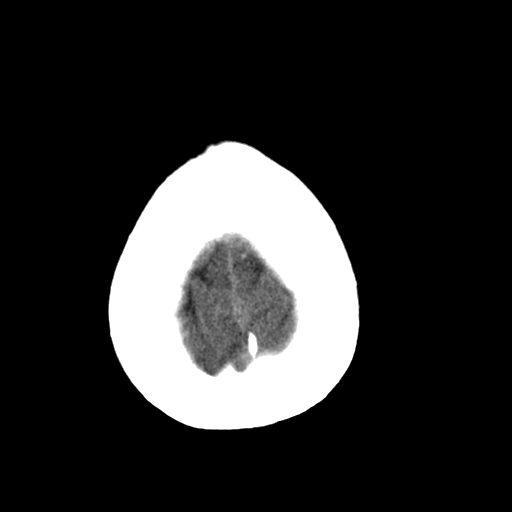
[im 26/28  brain]
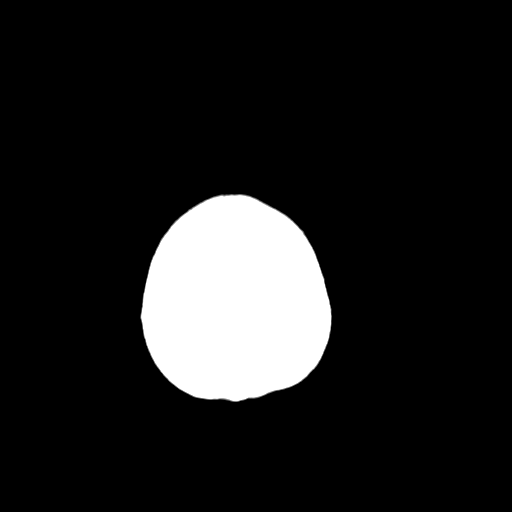
[im 26/28  bone]
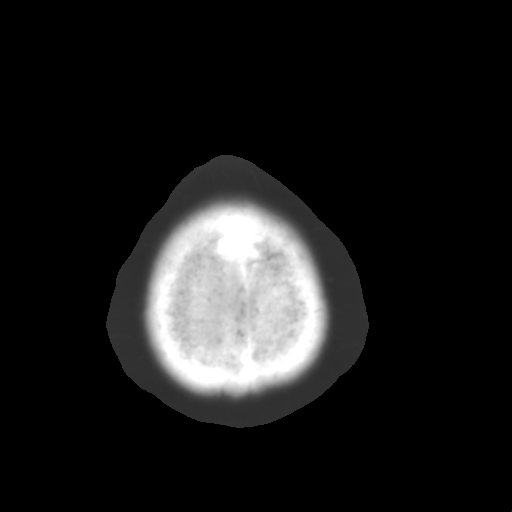

[Series 3: bone · axial · 0.39mm/px · z∈[+402,+442]mm · 3 of 28 slices shown]
[im 2/28  bone]
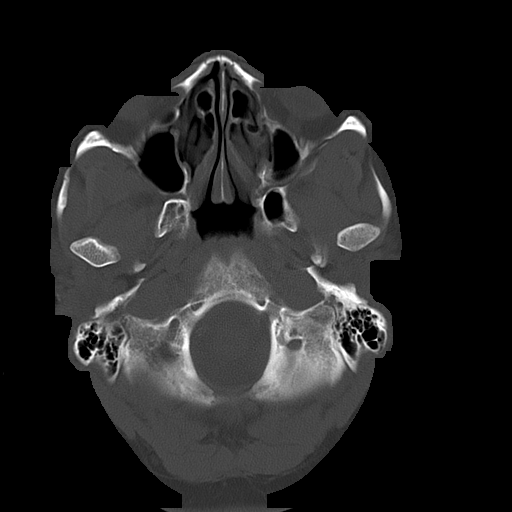
[im 6/28  bone]
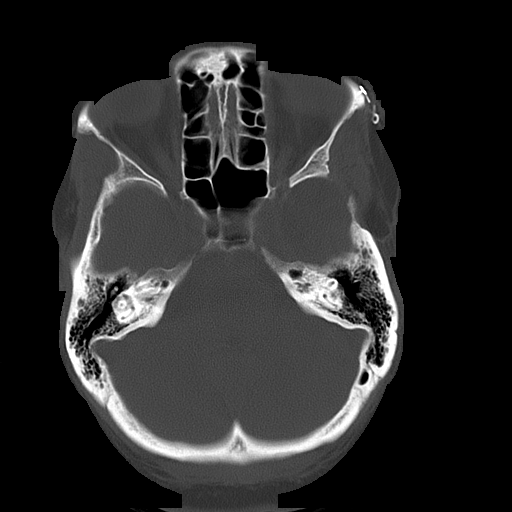
[im 10/28  bone]
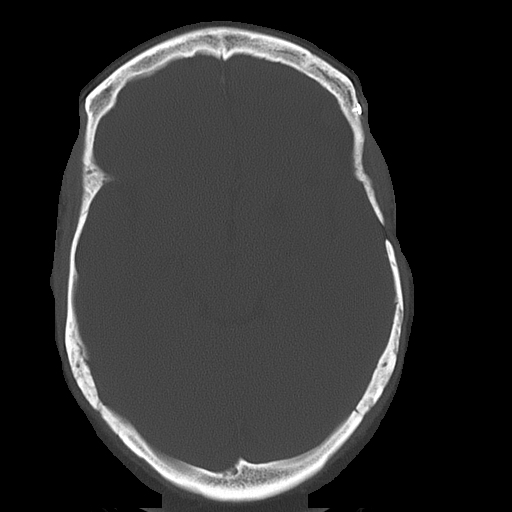

[16 of 30 positions shown; findings below may reference images not displayed]

PROCEDURE:     CT  - CT HEAD WITHOUT CONTRAST  - July 02, 2005 [DATE]

RESULT:        Unenhanced emergent head CT was performed for seizures and
originally read by the [HOSPITAL].  No acute findings are identified.
There is noted some encephalomalacia in the LEFT temporal region which may
be postoperative in nature.  No mass effect is seen.  No shift of the
midline.  No extra-axial fluid collections.  The ventricles appear within
normal limits.
IMPRESSION: 1.     Postoperative changes on the LEFT.
2.     No acute findings identified.

## 2007-07-10 IMAGING — CR DG SHOULDER 3+V*L*
1 series · 5 of 5 positions shown · non-contrast
Comparison: none

REASON FOR EXAM: Left shoulder pain, history of fracture
COMMENTS:

[Series 1: view not recorded · 0.17mm/px · 5 of 5 slices shown]
[im 1/5]
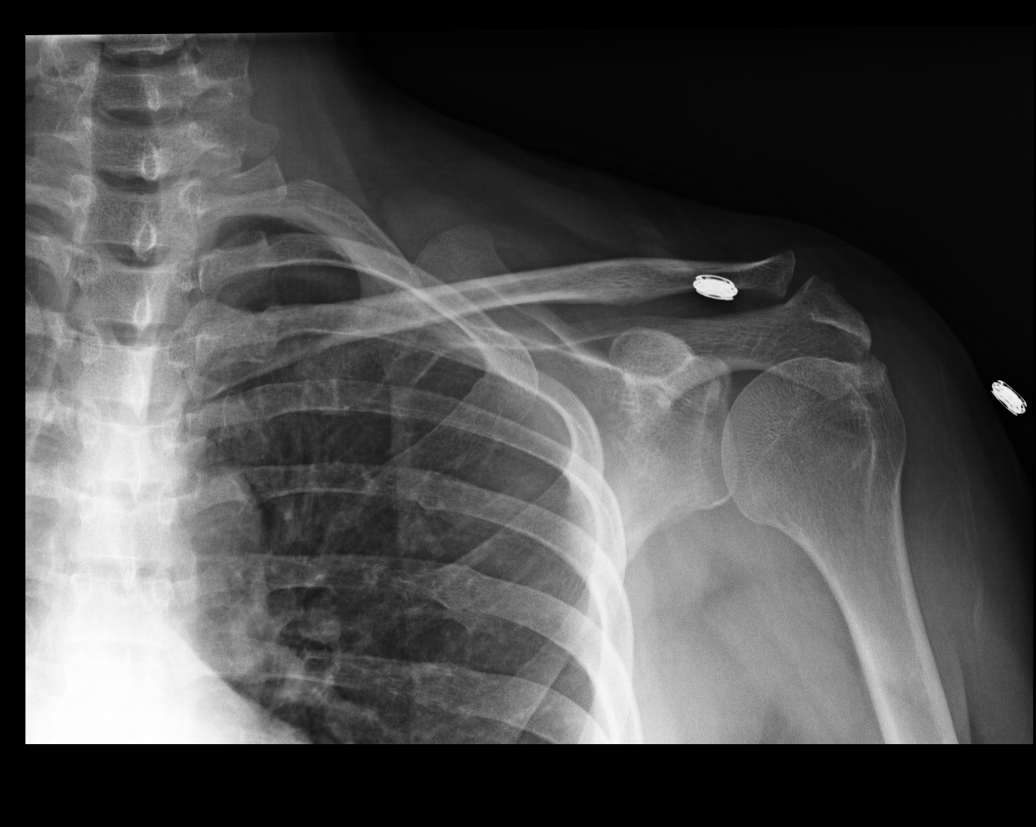
[im 2/5]
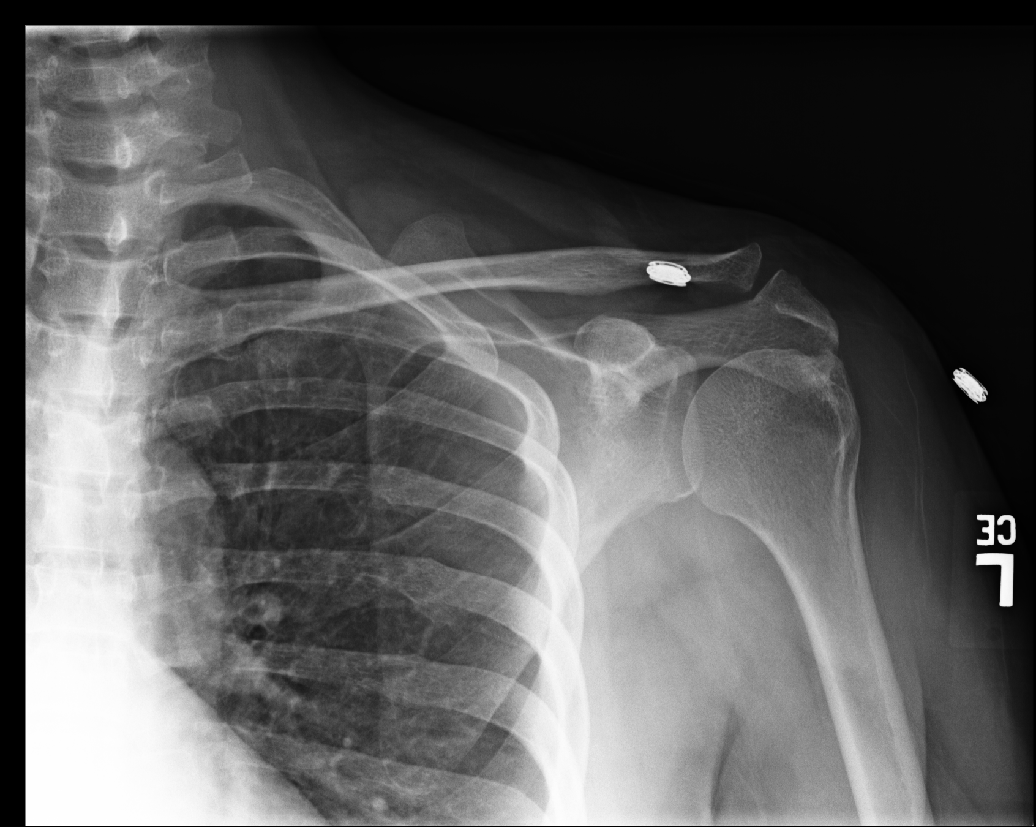
[im 3/5]
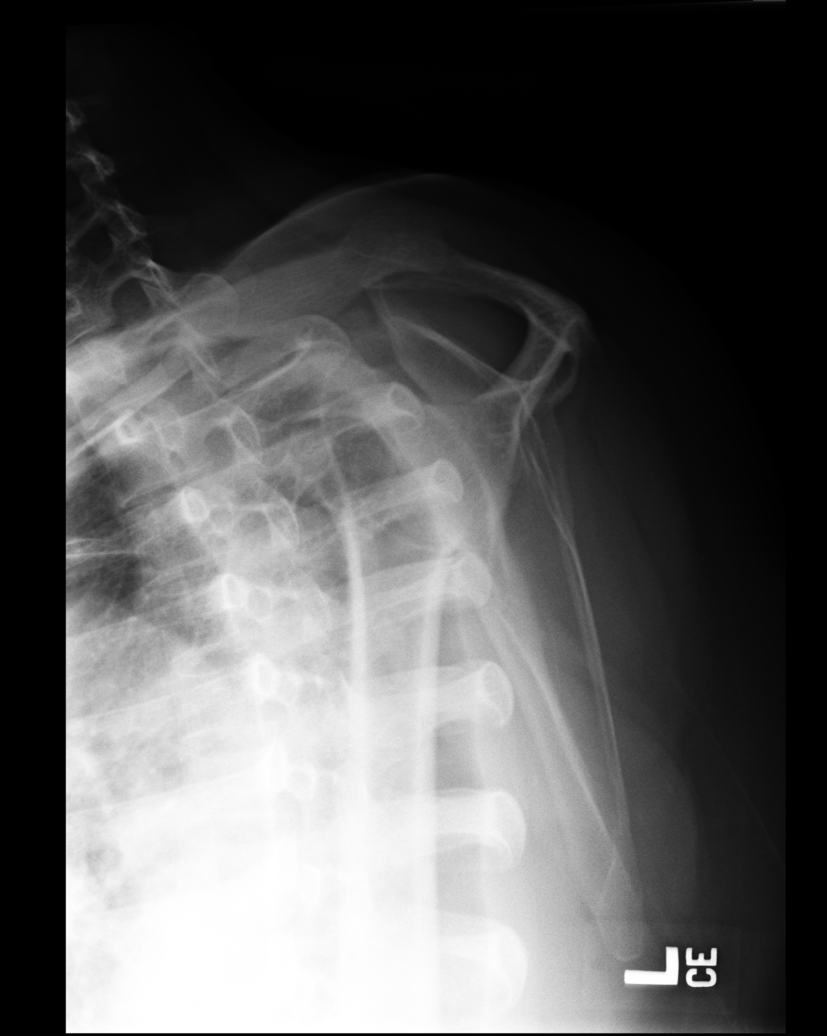
[im 4/5]
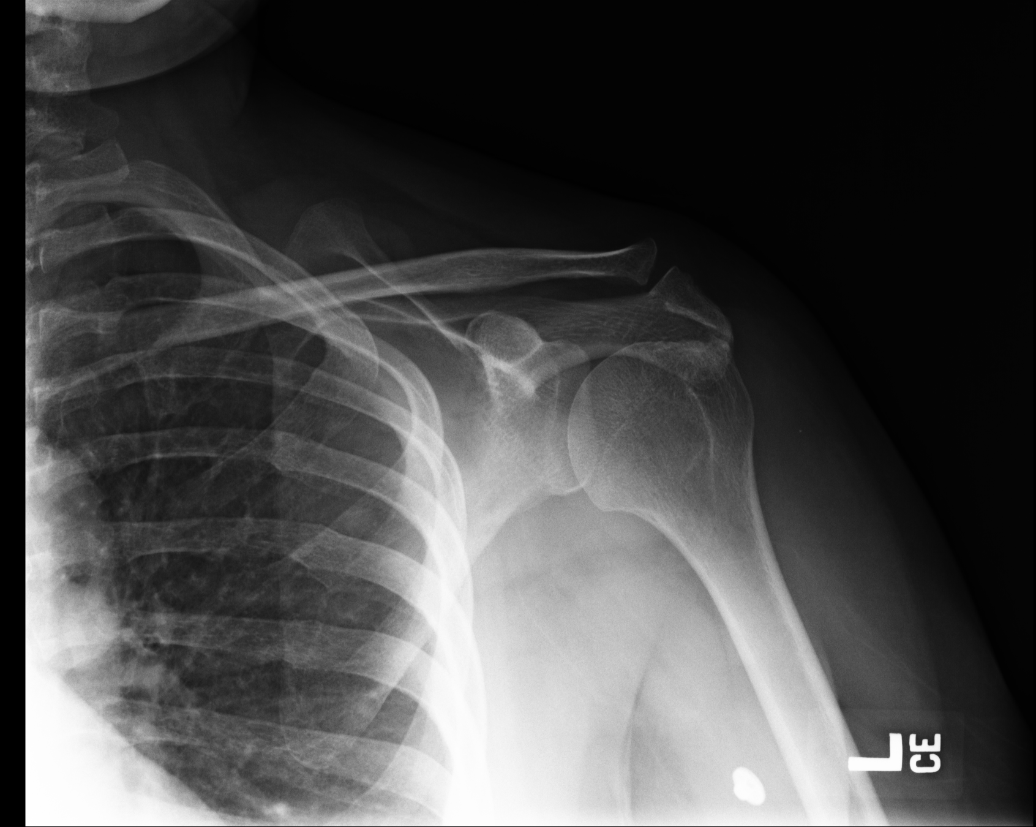
[im 5/5]
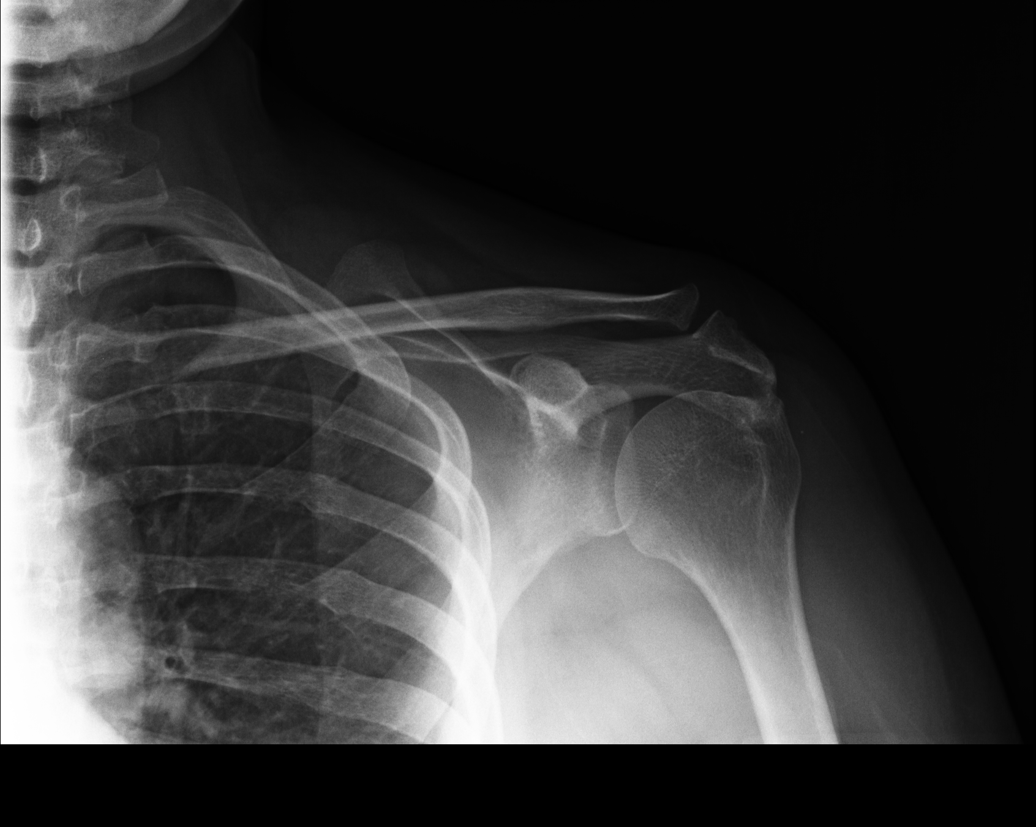

[5 of 5 positions shown; findings below may reference images not displayed]

PROCEDURE:     DXR - DXR SHOULDER LEFT COMPLETE  - July 30, 2005 [DATE]

RESULT:     Five views of the LEFT shoulder were obtained. No acute fracture
is seen. No dislocation is noted. There is spur formation off the superior
aspect of the greater tuberosity. The etiology for this is uncertain but, in
view of the patient's history of prior fracture, the finding could possibly
be secondary to residual change from prior trauma. No other significant
abnormalities are identified.
IMPRESSION: 1.     There is observed spur formation off the superior aspect of the
greater tuberosity.
2.     No acute fracture is seen.
3.     Although not mentioned above, there is observed little rotation of
the humeral head on the internal and external rotation views.

## 2007-07-10 IMAGING — CT CT HEAD WITHOUT CONTRAST
2 series · 16 of 30 positions shown, 20 images · non-contrast
Comparison: none

REASON FOR EXAM: Possible stroke, rule out bleed, history of craniotomy
COMMENTS:

[Series 2: without · axial · non-contrast · 0.39mm/px · z∈[+12,+132]mm · 13 of 30 slices shown, 17 images]
[im 3/30  brain]
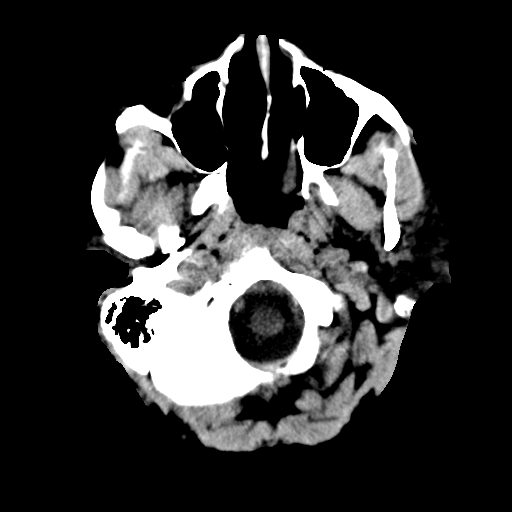
[im 3/30  bone]
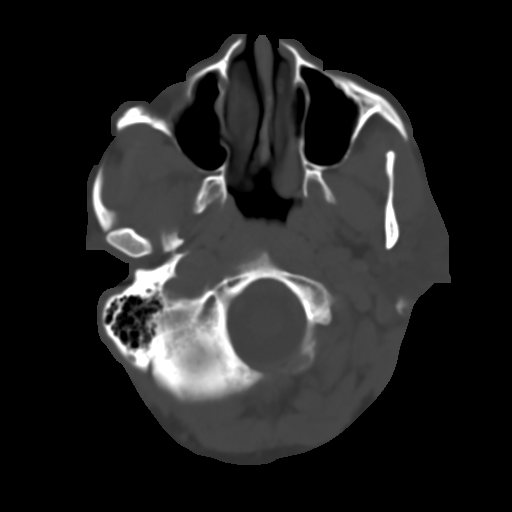
[im 5/30  brain]
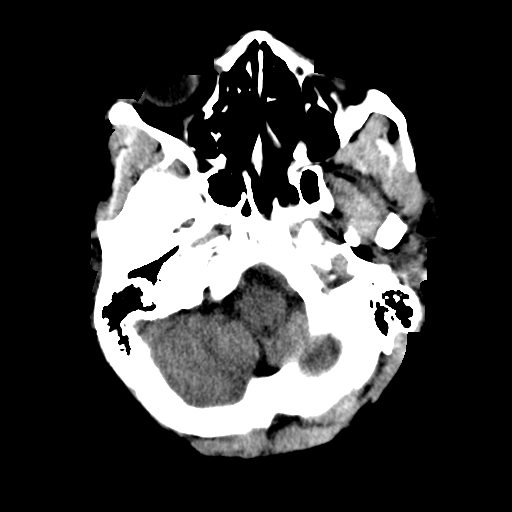
[im 7/30  brain]
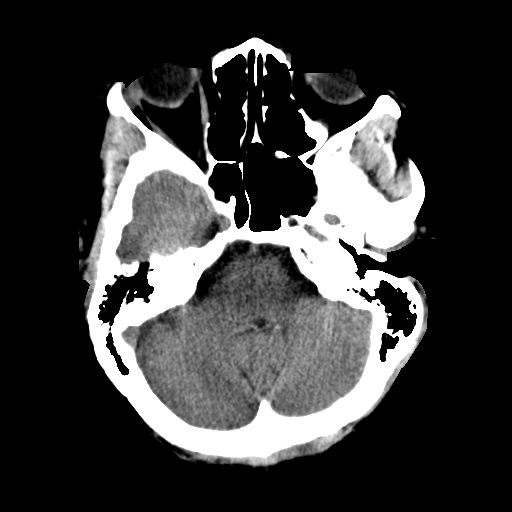
[im 9/30  brain]
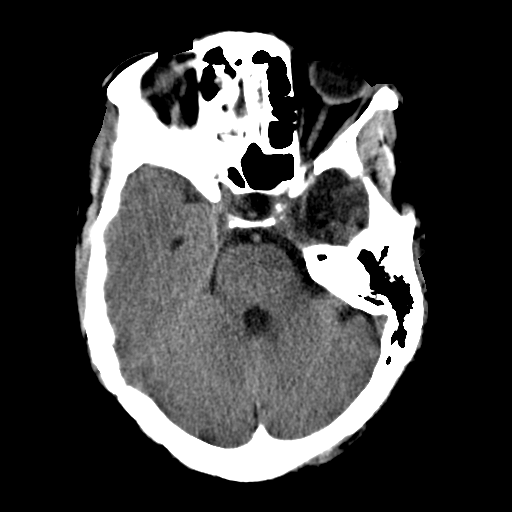
[im 11/30  brain]
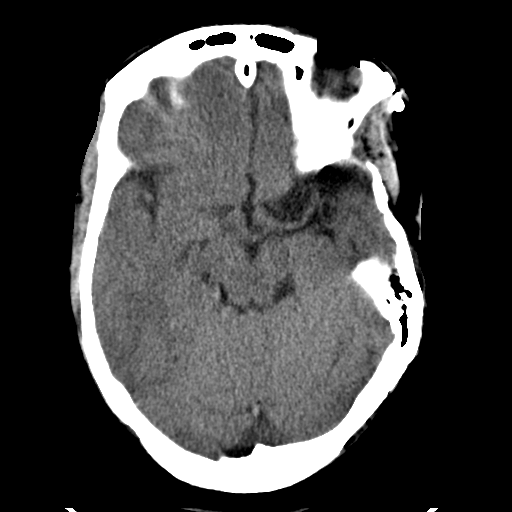
[im 11/30  bone]
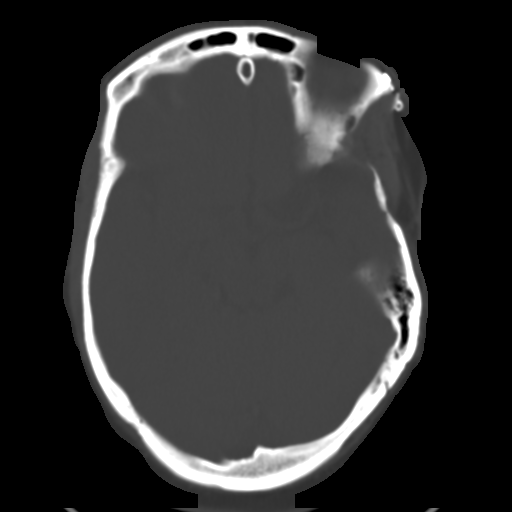
[im 13/30  brain]
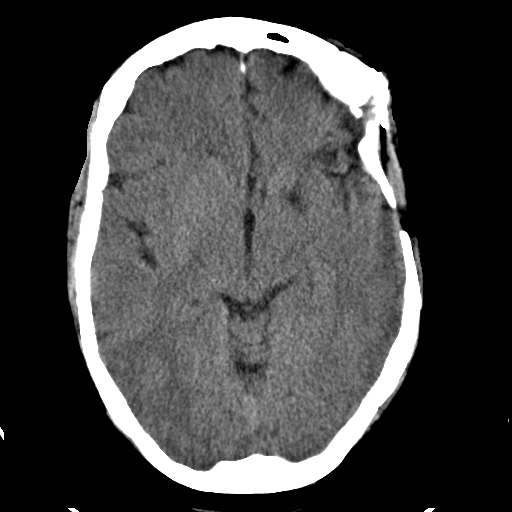
[im 15/30  brain]
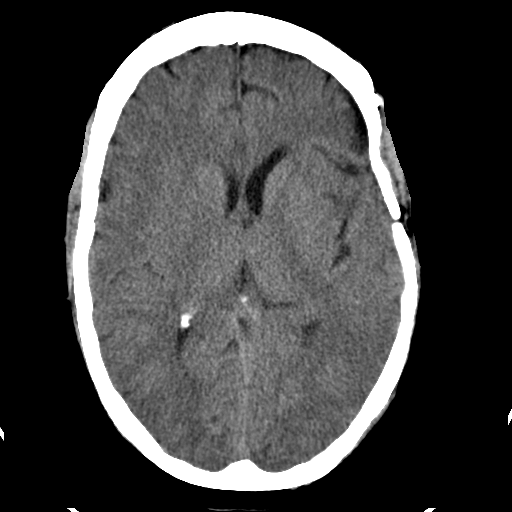
[im 17/30  brain]
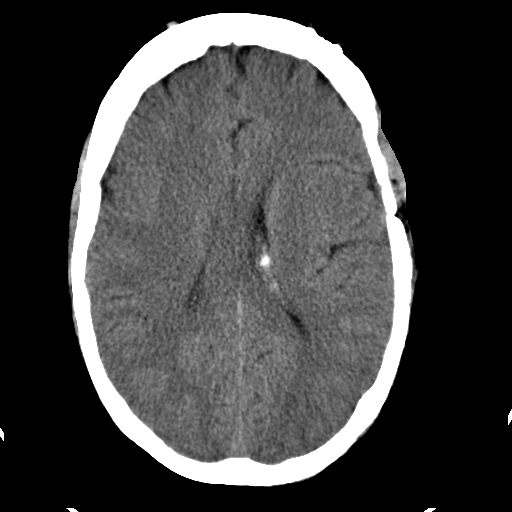
[im 19/30  brain]
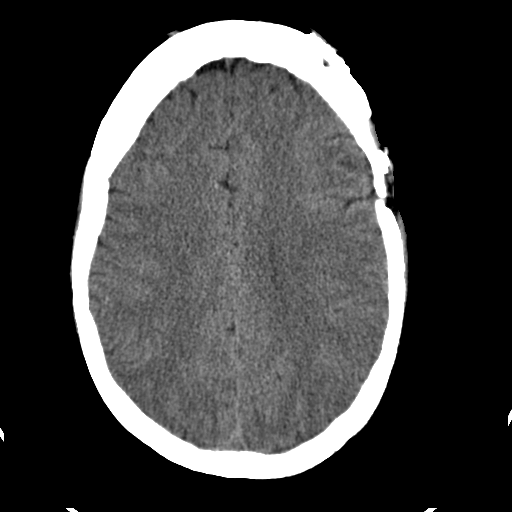
[im 19/30  bone]
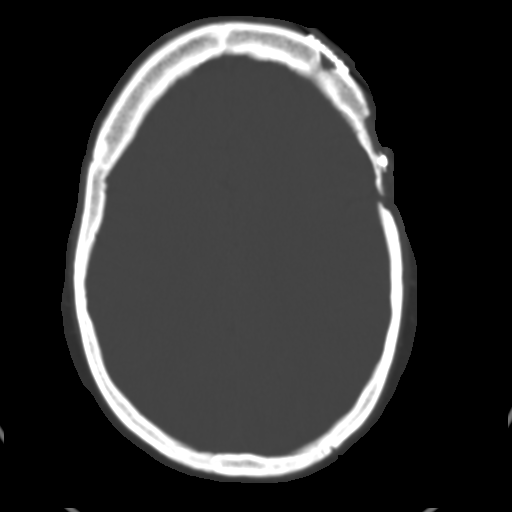
[im 21/30  brain]
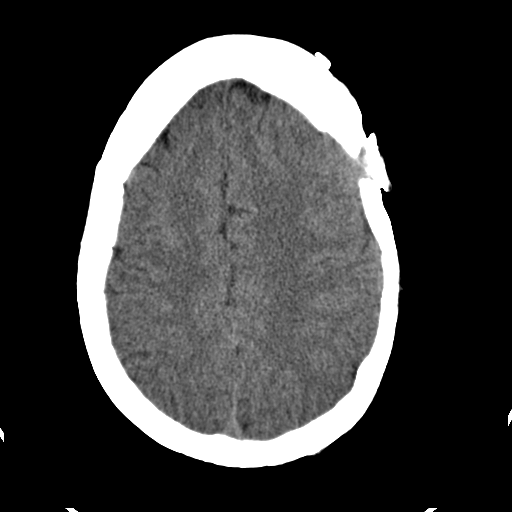
[im 23/30  brain]
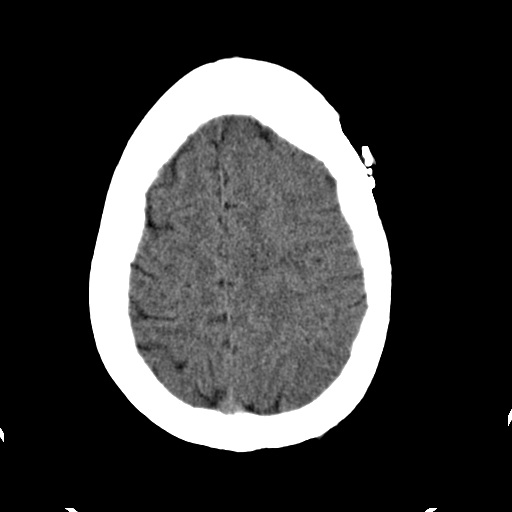
[im 25/30  brain]
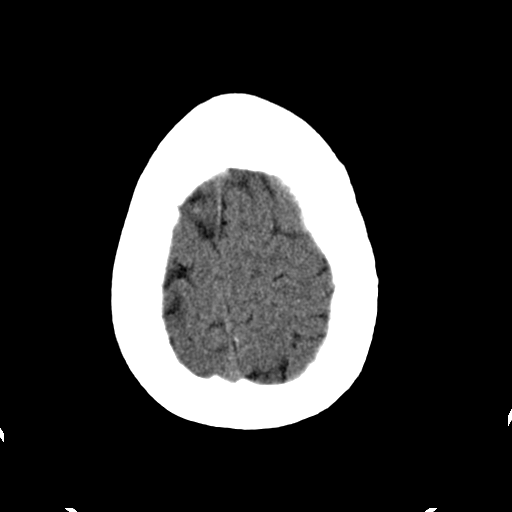
[im 27/30  brain]
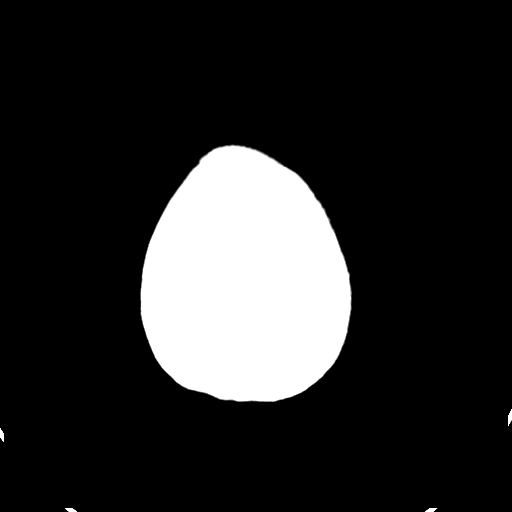
[im 27/30  bone]
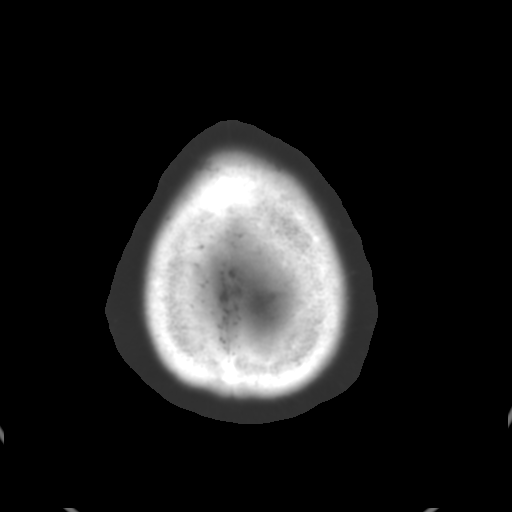

[Series 3: bone · axial · 0.39mm/px · z∈[+12,+52]mm · 3 of 30 slices shown]
[im 3/30  bone]
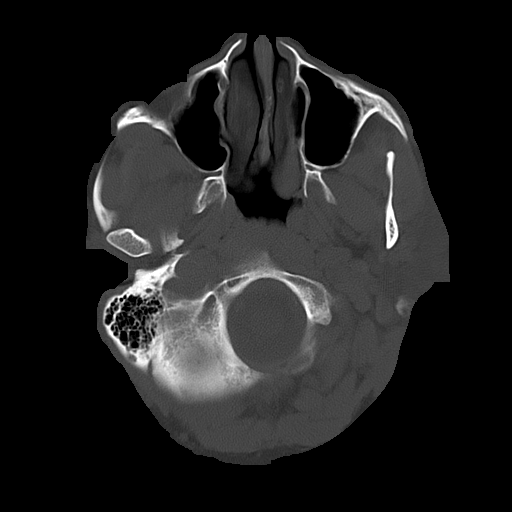
[im 7/30  bone]
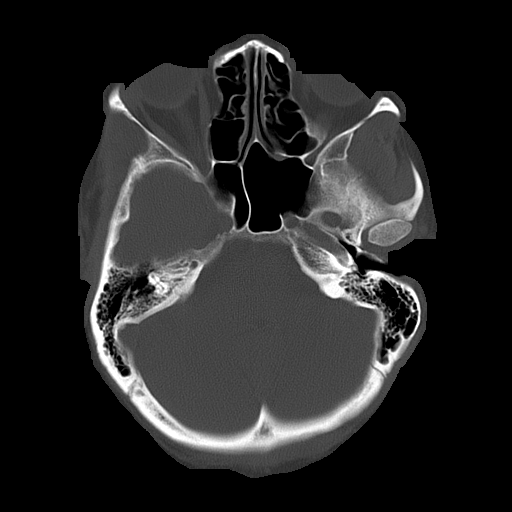
[im 11/30  bone]
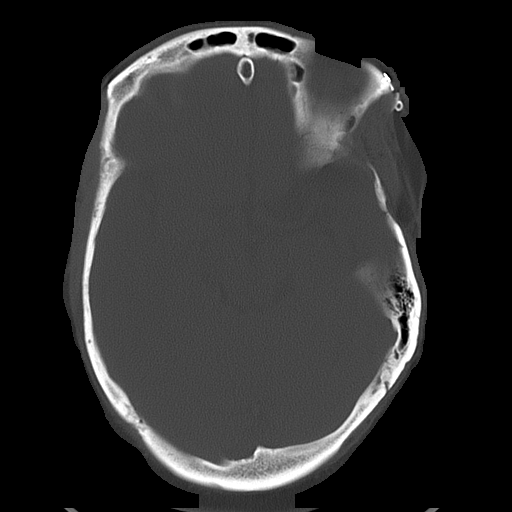

[16 of 30 positions shown; findings below may reference images not displayed]

PROCEDURE:     CT  - CT HEAD WITHOUT CONTRAST  - July 30, 2005  [DATE]

RESULT:     Comparison is made to a prior study of 07/02/2005.

The ventricles are normal in size and position. The patient has undergone
prior craniotomy on the LEFT with evidence of repair. This is in the frontal
and anterior parietal regions. I do not see evidence of an acute
intracranial hemorrhage nor space occupying lesion.
IMPRESSION: I do not see acute intracranial abnormality or evidence of
significant change since the prior study.

A preliminary report was sent to the Emergency Room at the conclusion of the
study.

## 2007-07-11 IMAGING — CR DG CHEST 2V
1 series · 2 of 2 positions shown · non-contrast
Comparison: none

REASON FOR EXAM: Cough, chest pain
COMMENTS:

[Series 1: view not recorded · 0.17mm/px · 2 of 2 slices shown]
[im 1/2]
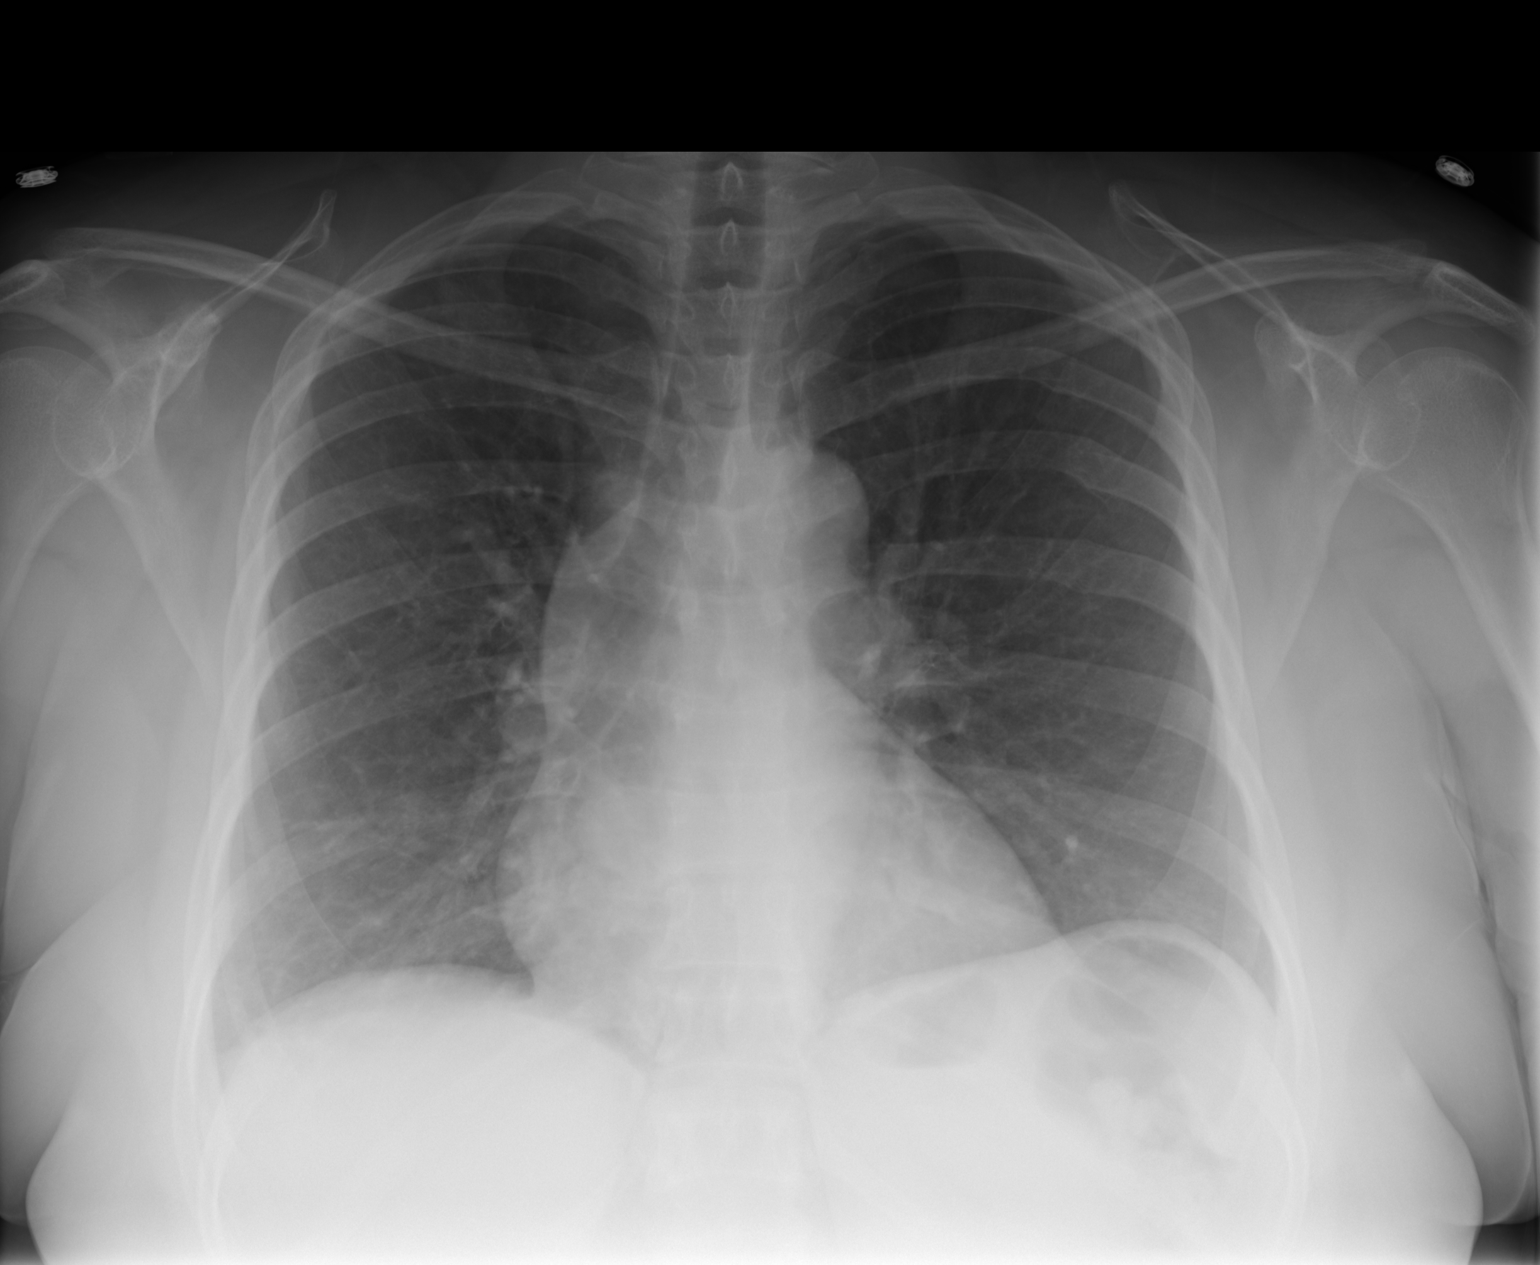
[im 2/2]
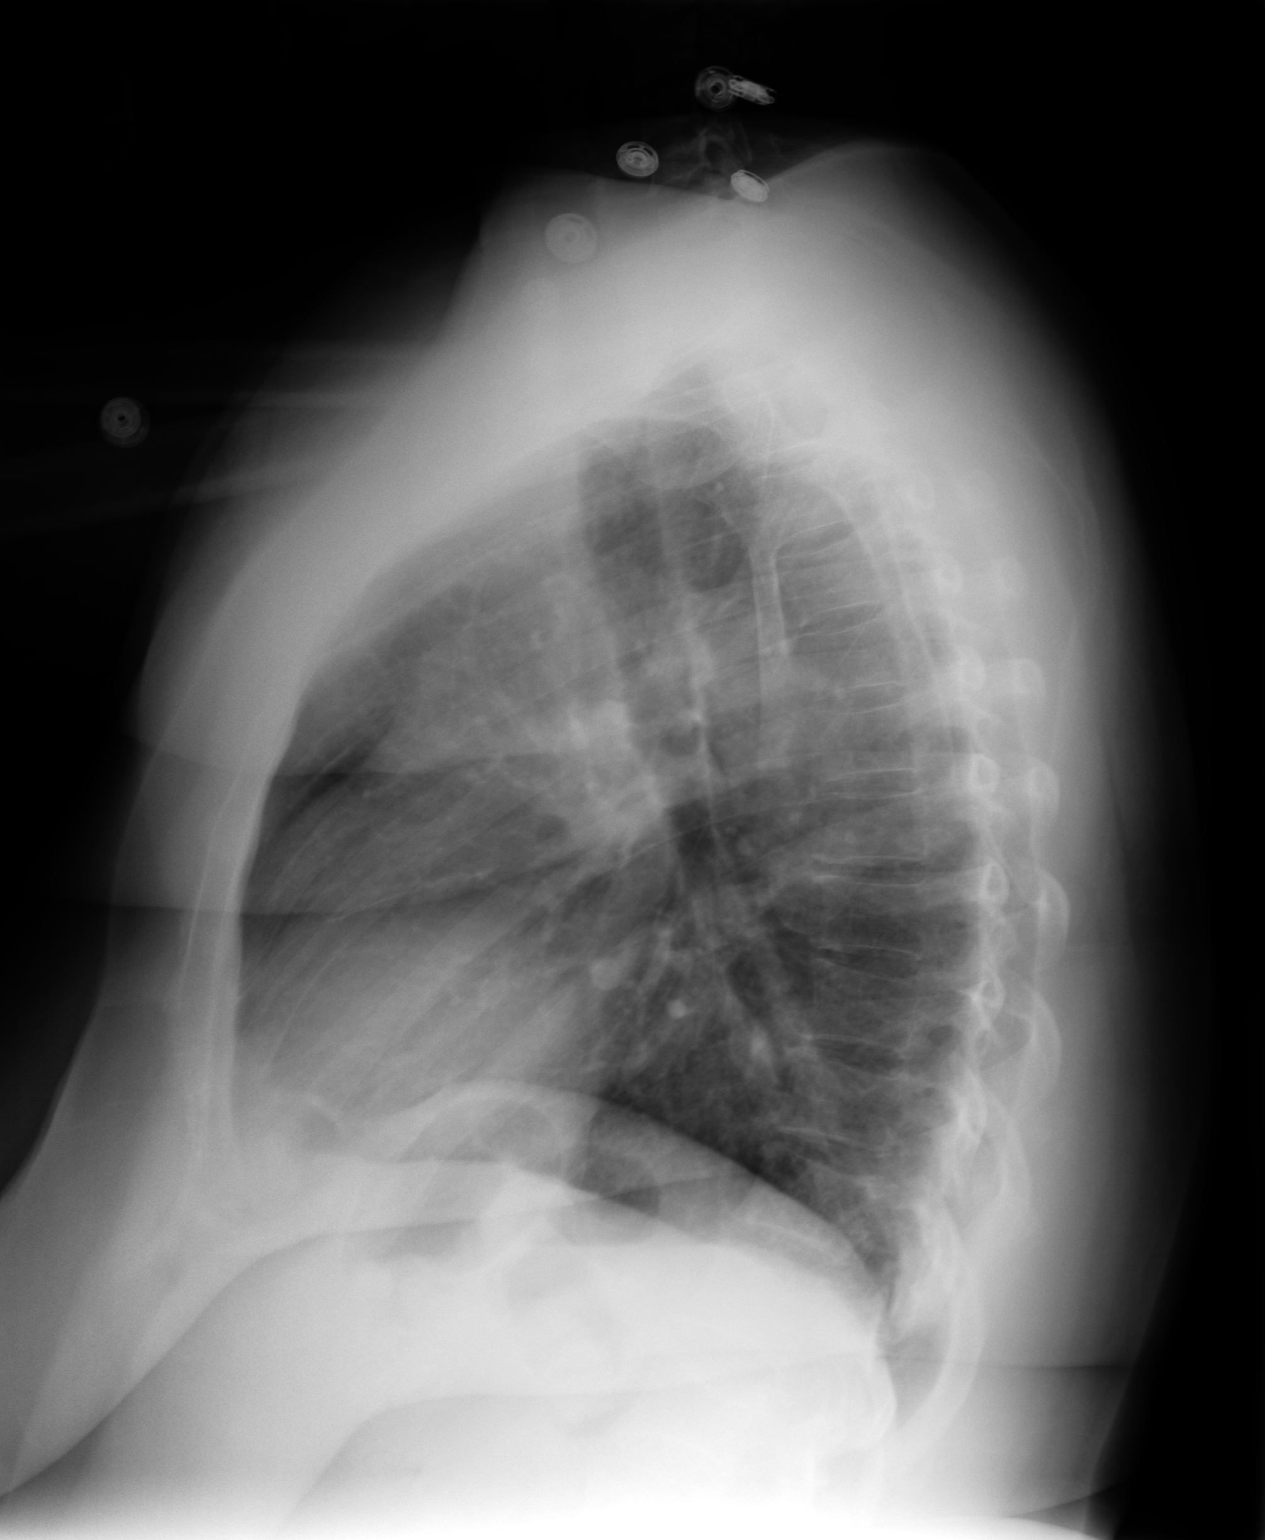

[2 of 2 positions shown; findings below may reference images not displayed]

PROCEDURE:     DXR - DXR CHEST PA (OR AP) AND LATERAL  - July 31, 2005  [DATE]

RESULT:     The current exam is compared to a prior exam of 01/15/2005.

The current exam shows the lung fields to be clear. The LEFT basilar
atelectasis previously present is no longer seen. The current exam shows no
pneumonia, pneumothorax or pleural effusion. The heart size is normal. Old
rib fracture deformities are again seen on the LEFT.
IMPRESSION: No acute changes are identified.

## 2007-10-07 ENCOUNTER — Ambulatory Visit: Payer: Self-pay | Admitting: Family Medicine

## 2007-10-30 ENCOUNTER — Ambulatory Visit: Payer: Self-pay | Admitting: Internal Medicine

## 2008-01-03 IMAGING — CR DG BARIUM ENEMA
2 series · 15 of 18 positions shown · non-contrast
Comparison: none

REASON FOR EXAM: rectovaginal fistula pt has colostomy only single contrast
thru rectum only
COMMENTS:

[Series 1: run · 9 of 11 slices shown]
[im 1/11]
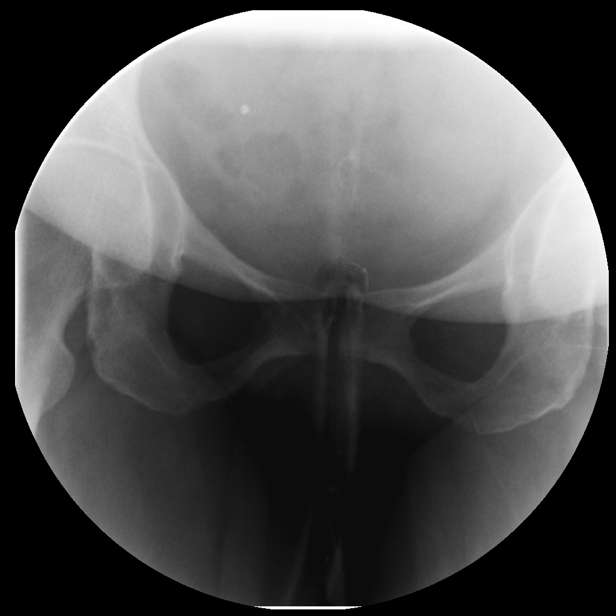
[im 2/11]
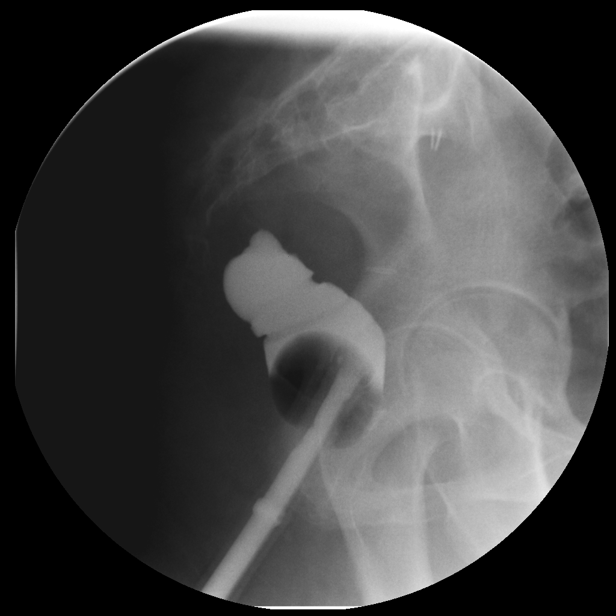
[im 4/11]
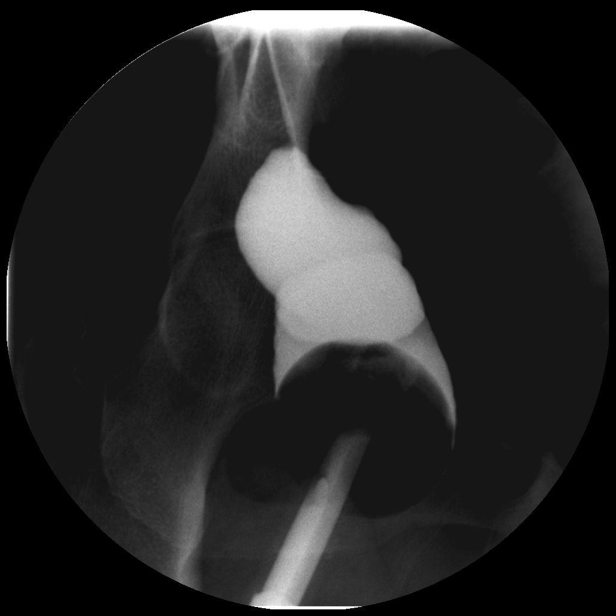
[im 5/11]
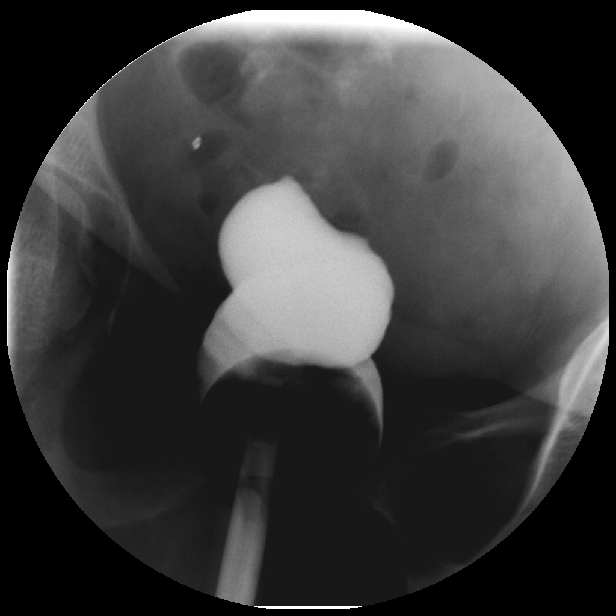
[im 6/11]
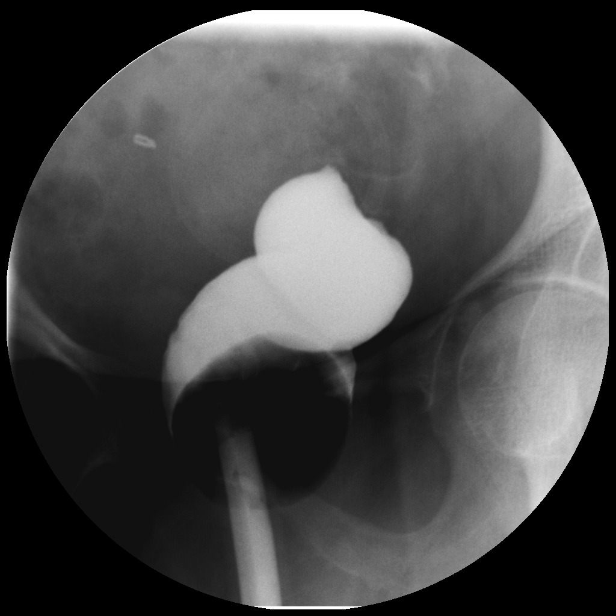
[im 7/11]
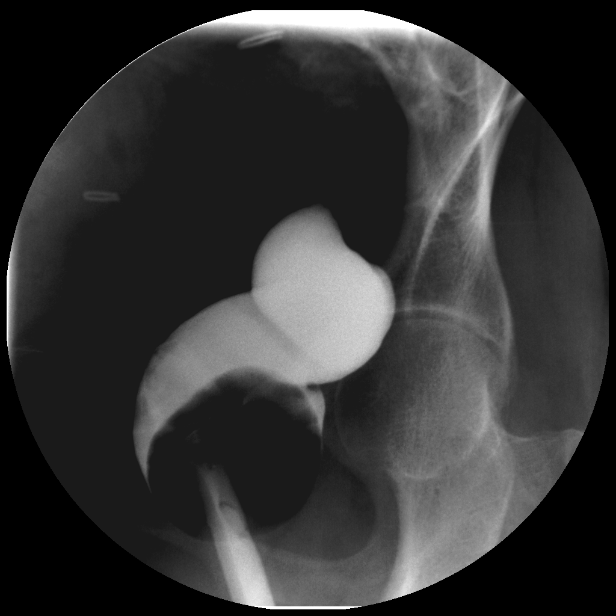
[im 8/11]
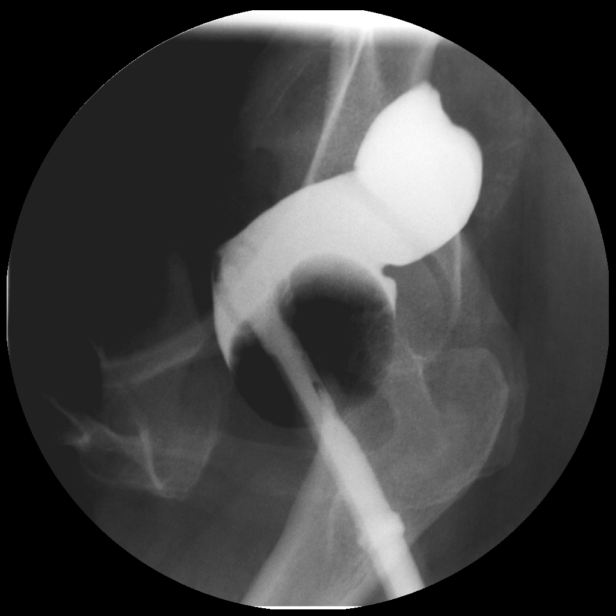
[im 10/11]
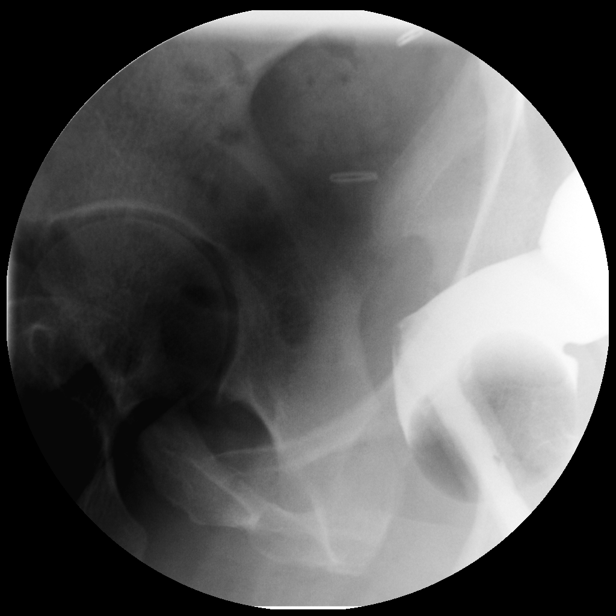
[im 11/11]
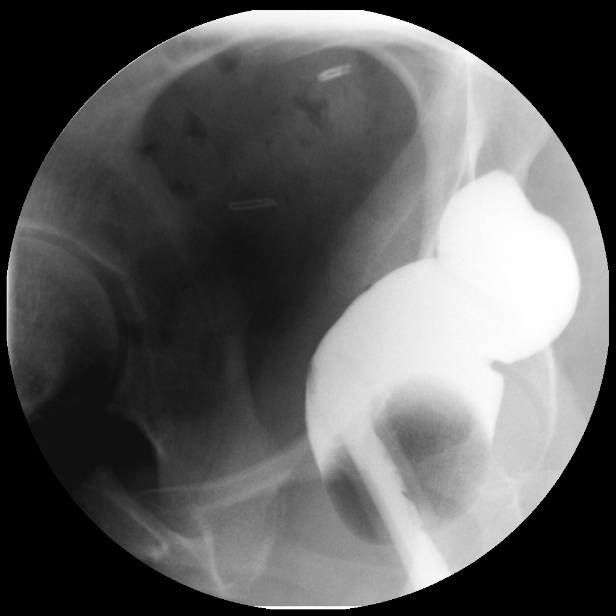

[Series 1: view not recorded · 0.17mm/px · 6 of 7 slices shown]
[im 1/7]
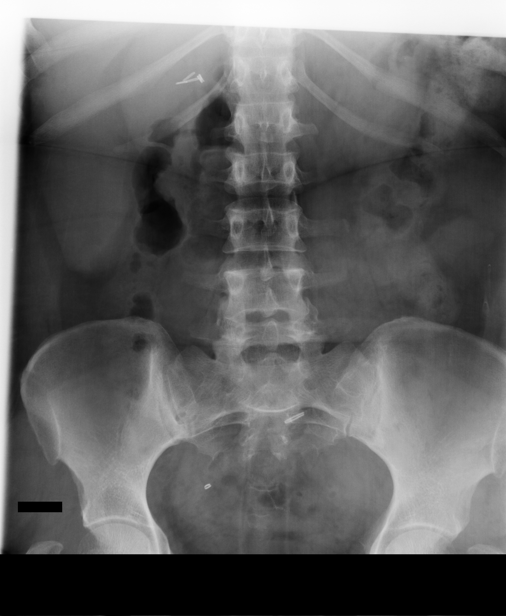
[im 2/7]
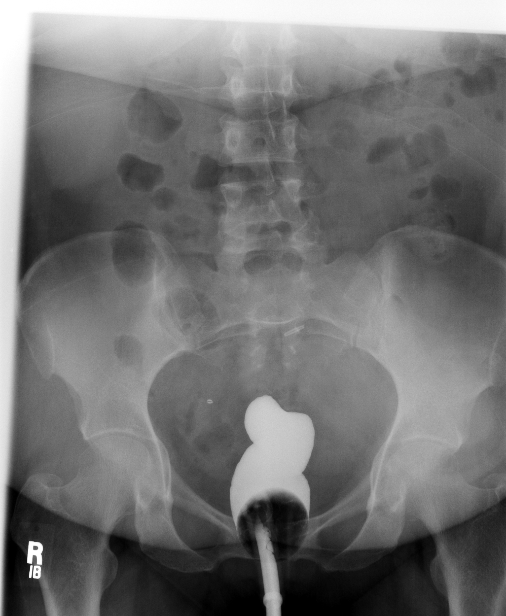
[im 3/7]
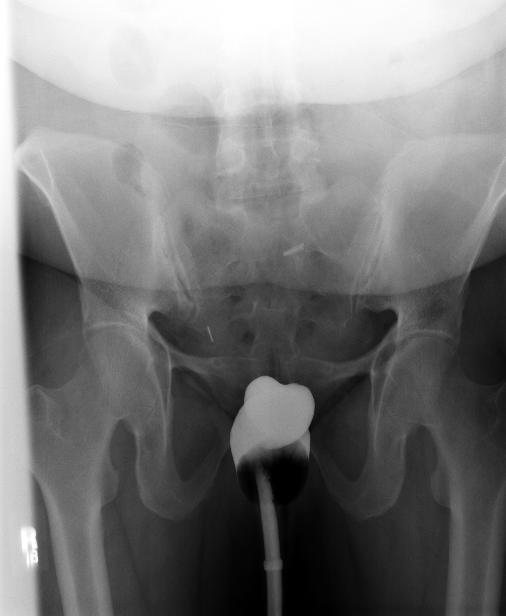
[im 5/7]
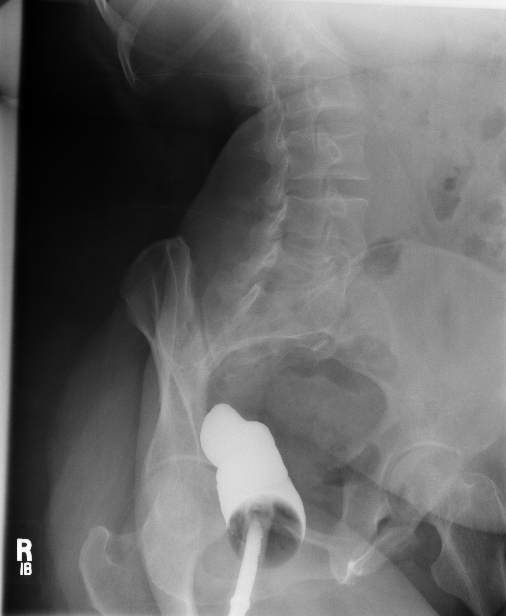
[im 6/7]
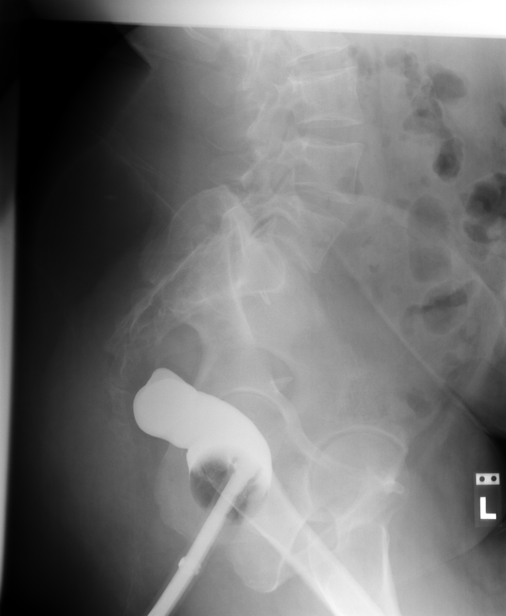
[im 7/7]
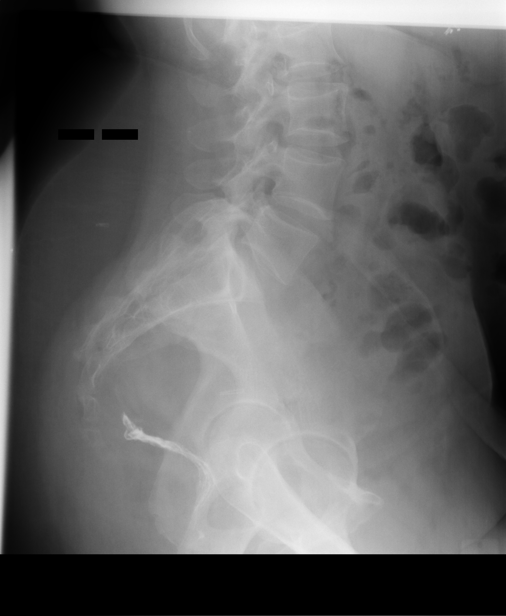

[15 of 18 positions shown; findings below may reference images not displayed]

PROCEDURE:     FL  - FL BARIUM ENEMA (COLON)  - January 23, 2006  [DATE]

RESULT:     The patient has a history of a colostomy with a rectal pouch and
the study is performed to evaluate for rectovaginal fistula.  Single
contrast limited filling of the rectal pouch shows surgical clips. A
definite transfer of barium from the rectum to the vagina is not detected.
On the RIGHT posterior oblique views there is some minimal angularity along
the anterior wall, which could be an area of possible fibrosis or previous
fistulization or this could be deformity caused by the enema tip.  Post
drainage and post evacuation images show no definite barium seen within the
vagina.
IMPRESSION: 1)No significant abnormality demonstrated. No rectovaginal fistula is
demonstrated.

## 2008-01-07 ENCOUNTER — Ambulatory Visit: Payer: Self-pay | Admitting: Unknown Physician Specialty

## 2008-02-08 IMAGING — CR DG CHEST 1V PORT
1 series · 1 of 1 positions shown · non-contrast
Comparison: none

REASON FOR EXAM: Central line placement
COMMENTS:

[view not recorded]
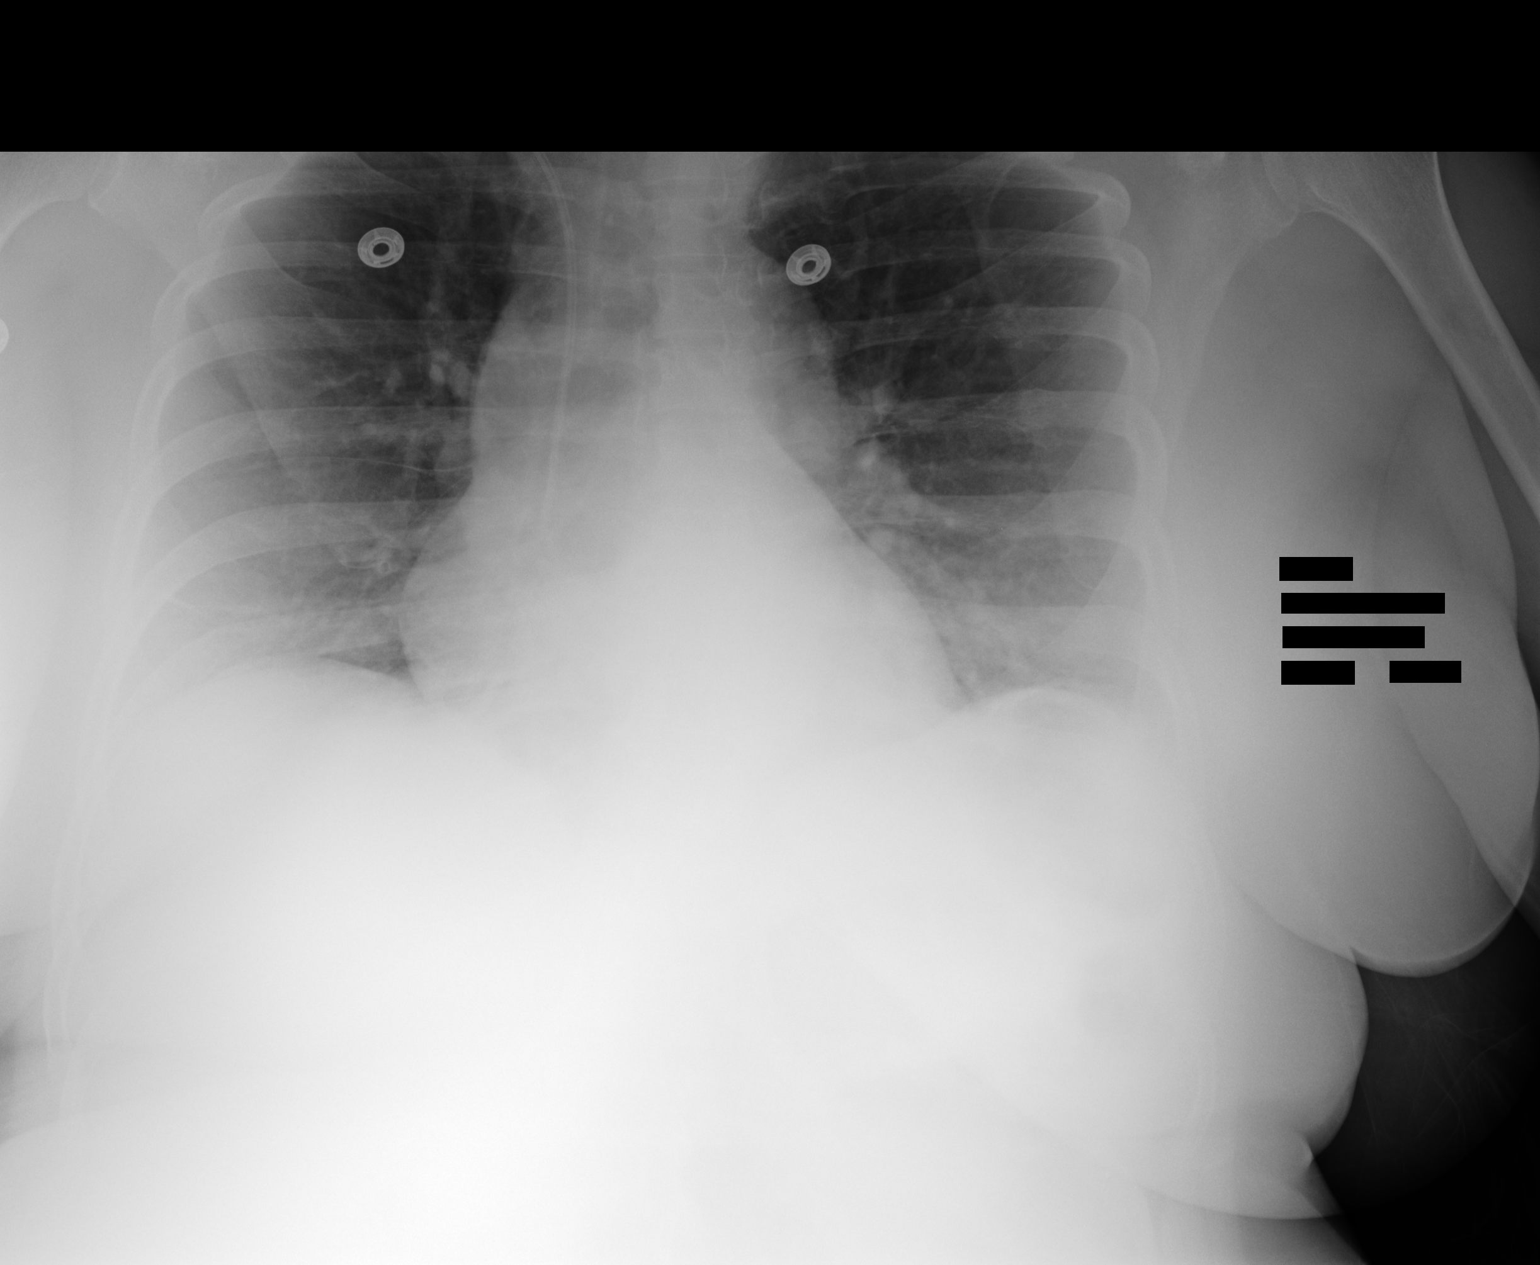

[1 of 1 positions shown; findings below may reference images not displayed]

PROCEDURE:     DXR - DXR PORTABLE CHEST SINGLE VIEW  - February 28, 2006  [DATE]

RESULT:     AP view of the chest is compared to a prior exam of 07/31/2005.

A venous catheter is present on the RIGHT with the tip projected near the
junction of the superior vena cava and RIGHT atrium. No pneumothorax is
seen. The lung fields are clear. No pleural effusion or pneumonia is
identified. The heart is within normal limits in size for AP technique.
IMPRESSION: 1.     No acute changes are identified.
2.     A central line is present with the tip projected near the junction of
the superior vena cava and RIGHT atrium.

## 2008-02-25 ENCOUNTER — Ambulatory Visit: Payer: Self-pay | Admitting: Internal Medicine

## 2008-02-29 IMAGING — CR DG ABDOMEN 1V
1 series · 1 of 1 positions shown · non-contrast
Comparison: none

REASON FOR EXAM: Abdominal pain
COMMENTS:

[view not recorded]
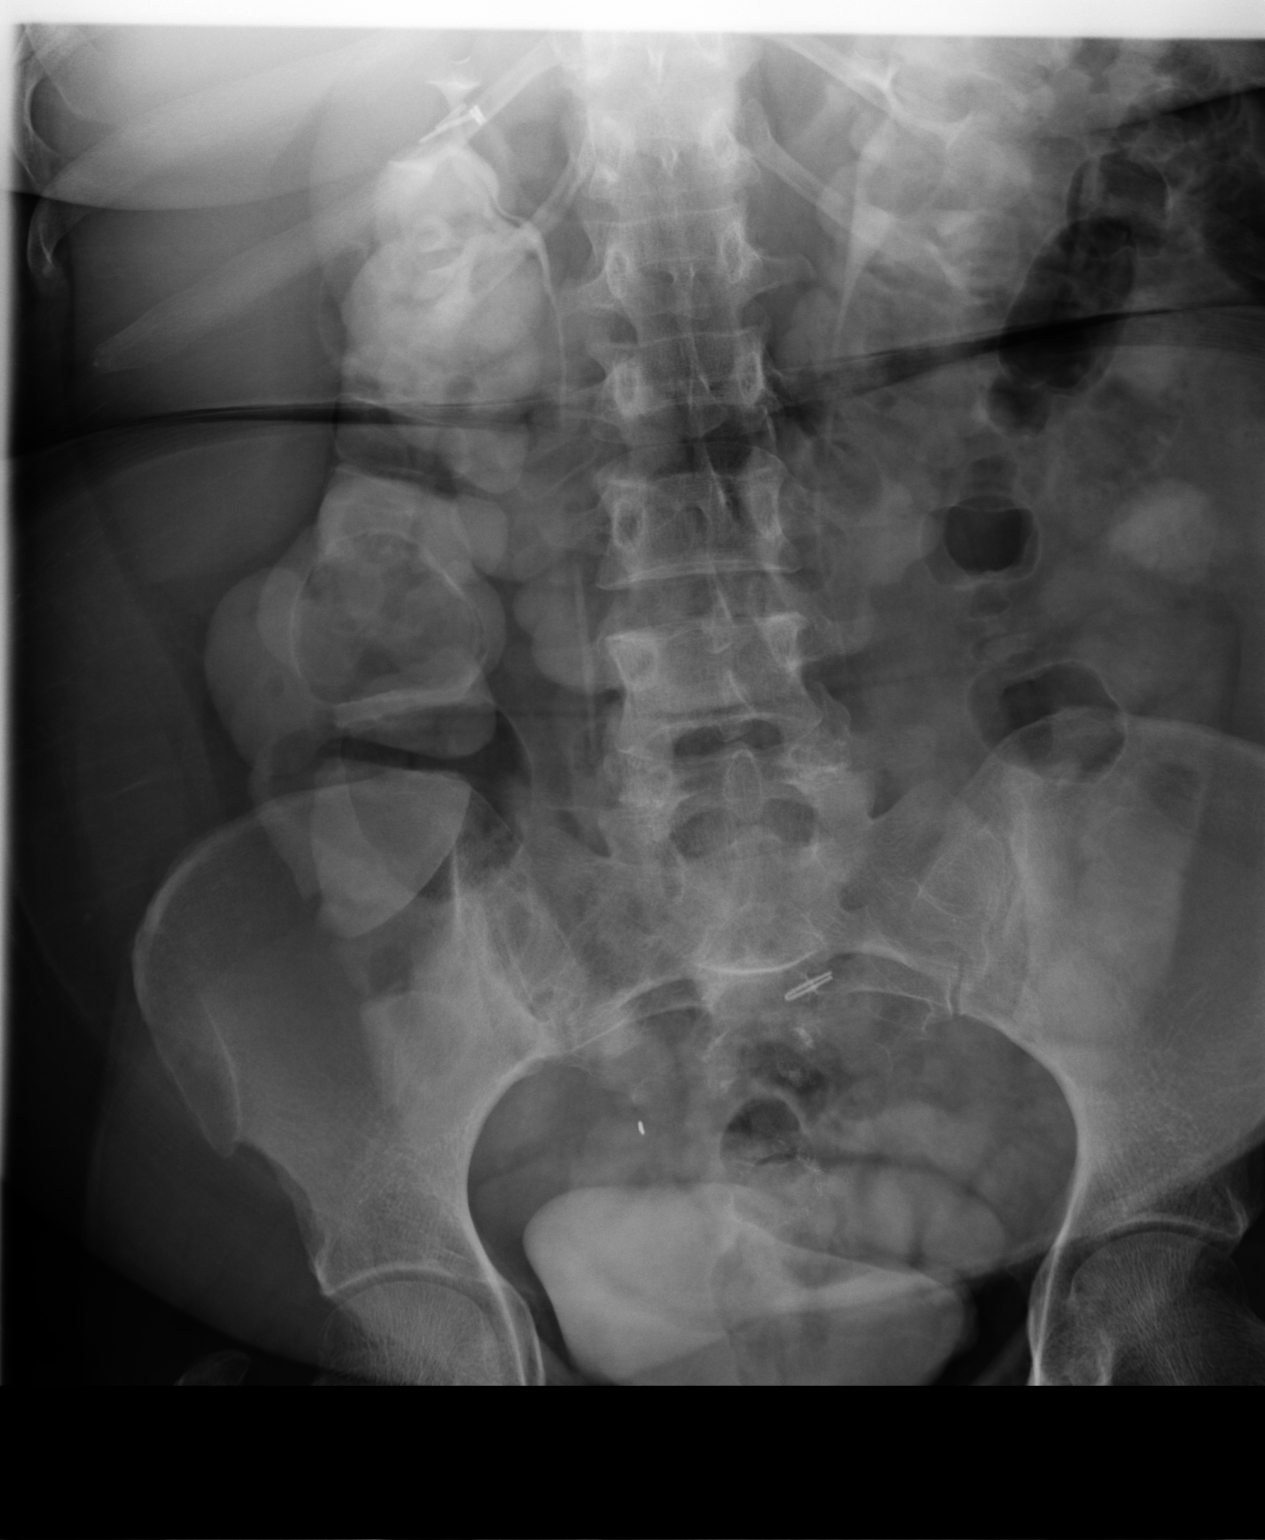

[1 of 1 positions shown; findings below may reference images not displayed]

PROCEDURE:     DXR - DXR KIDNEY URETER BLADDER  - March 21, 2006  [DATE]

RESULT:        An AP view of the abdomen shows contrast material in the
colon and contrast in the renal collecting systems bilaterally consistent
with residual oral and intravenous contrast from prior CT.  The bowel gas
pattern is normal.  No evidence for bowel obstruction is seen.  The kidneys
show no hydronephrosis.  Postoperative metallic clips are seen in the
pelvis.  No acute bony abnormalities are noted.
IMPRESSION: No acute changes are identified.

## 2008-02-29 IMAGING — CT CT ABD-PELV W/ CM
1 of 2 series · 16 of 32 positions shown, 20 images · non-contrast
Comparison: none

REASON FOR EXAM: (1) Abdominal pain, [HOSPITAL]   po and iv; (2) rm 9  abd
pain   po and iv
COMMENTS:

[Series 2: appendicitis · axial · 0.83mm/px · z∈[-518,-90]mm · 16 of 155 slices shown, 20 images]
[im 6/155  soft-tissue]
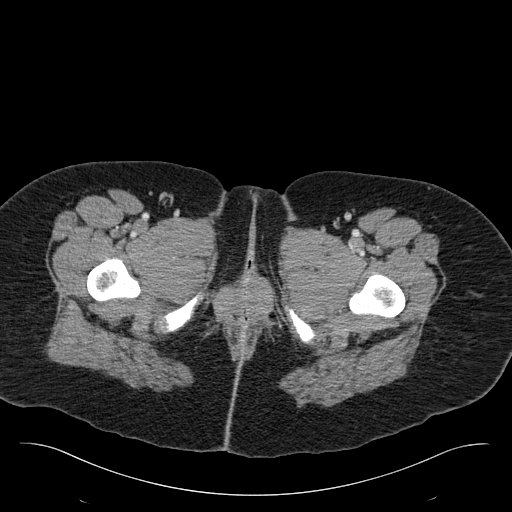
[im 6/155  bone]
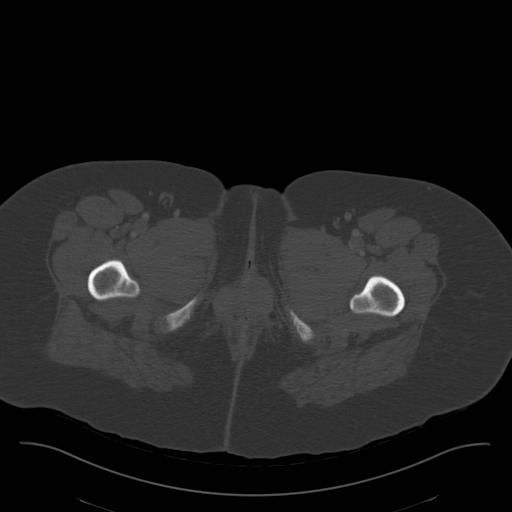
[im 18/155  soft-tissue]
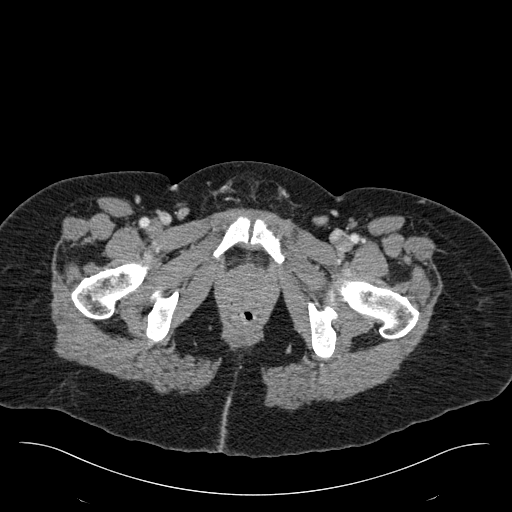
[im 30/155  soft-tissue]
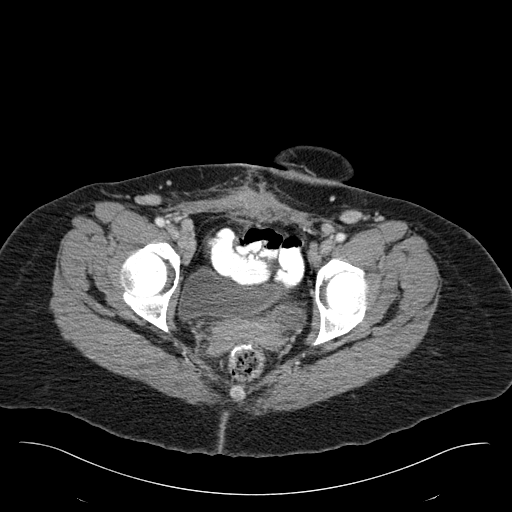
[im 42/155  soft-tissue]
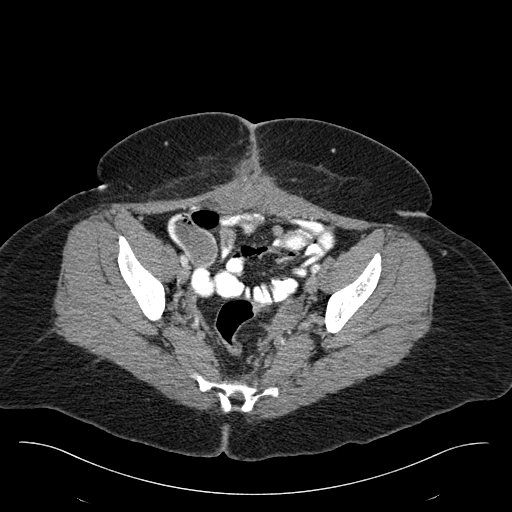
[im 54/155  soft-tissue]
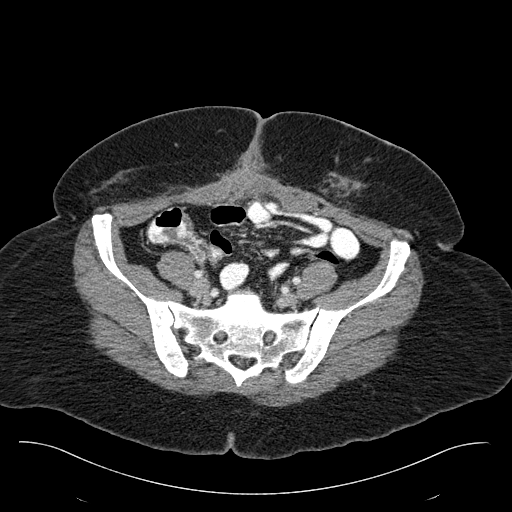
[im 60/155  soft-tissue]
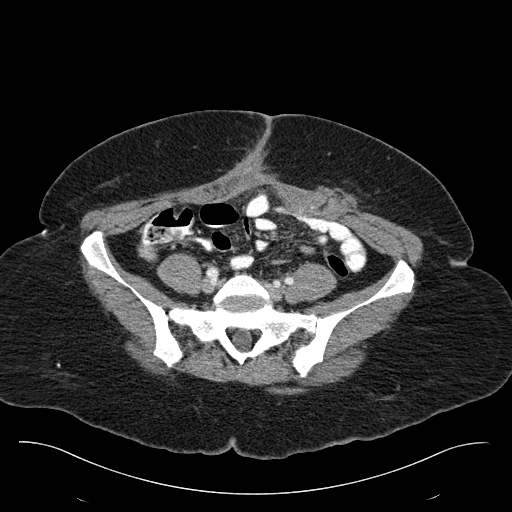
[im 72/155  soft-tissue]
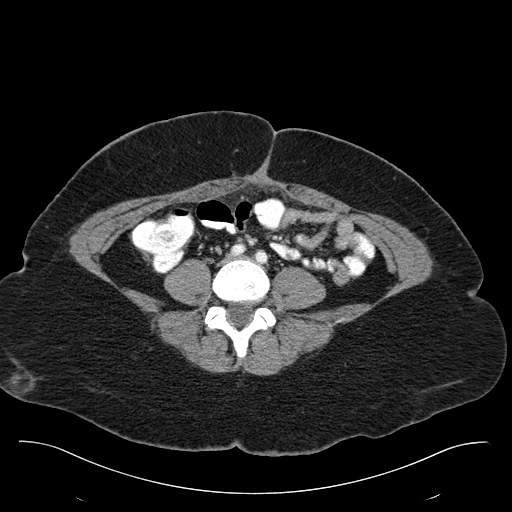
[im 83/155  soft-tissue]
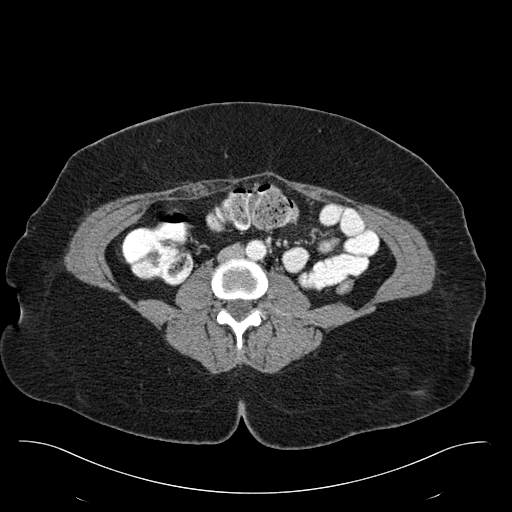
[im 95/155  soft-tissue]
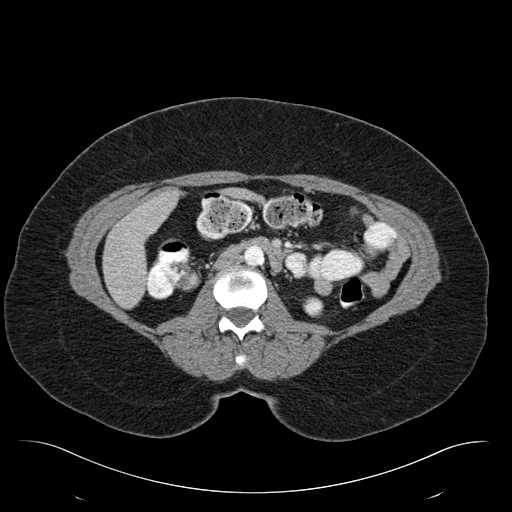
[im 95/155  bone]
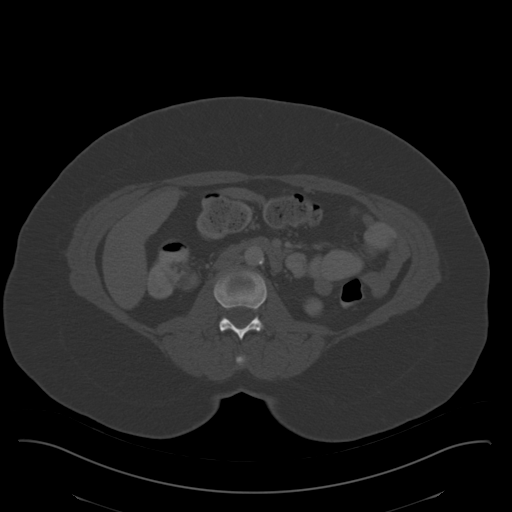
[im 101/155  soft-tissue]
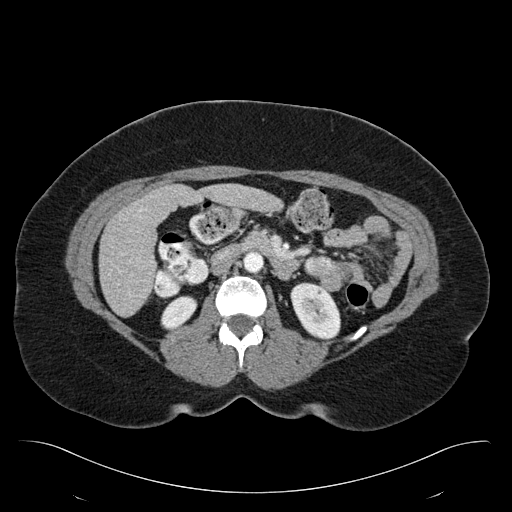
[im 113/155  soft-tissue]
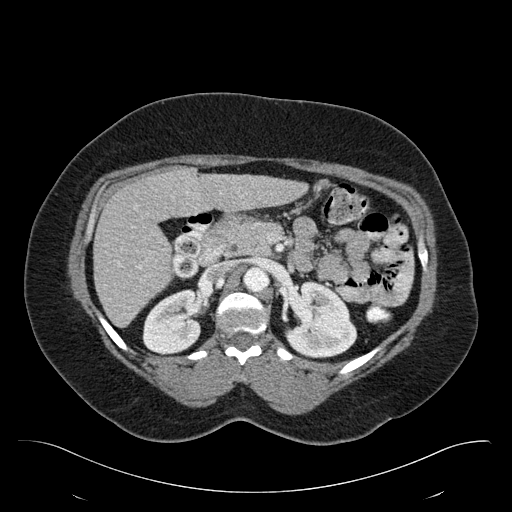
[im 125/155  soft-tissue]
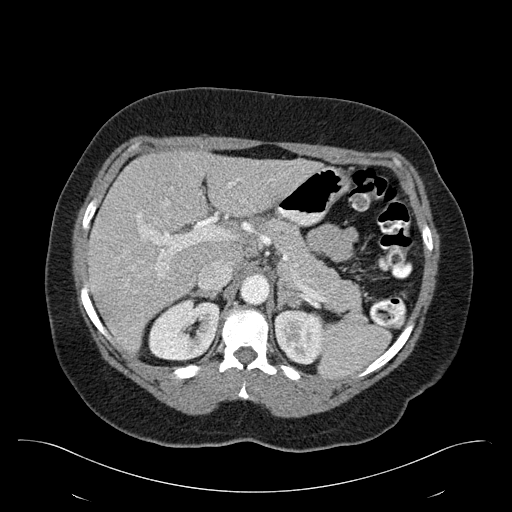
[im 131/155  lung]
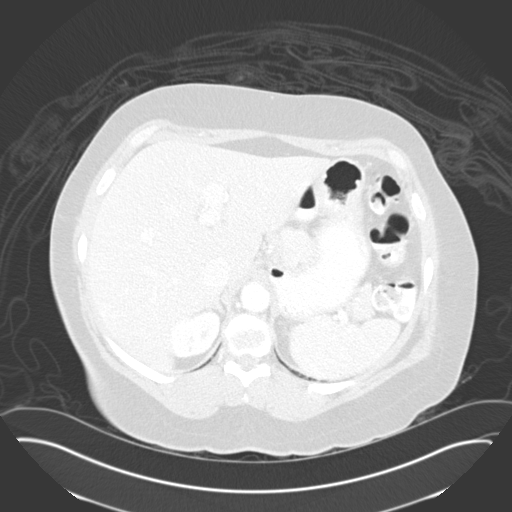
[im 137/155  soft-tissue]
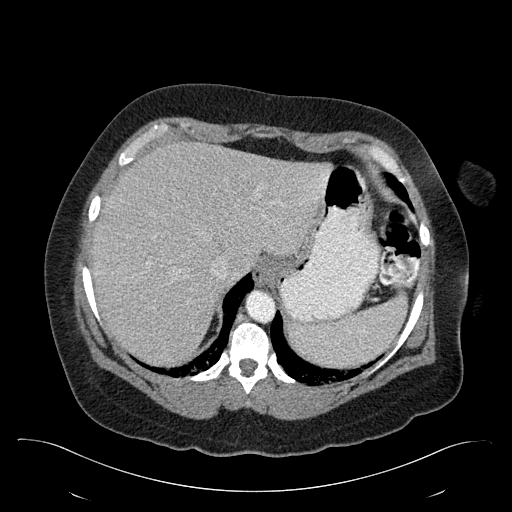
[im 137/155  lung]
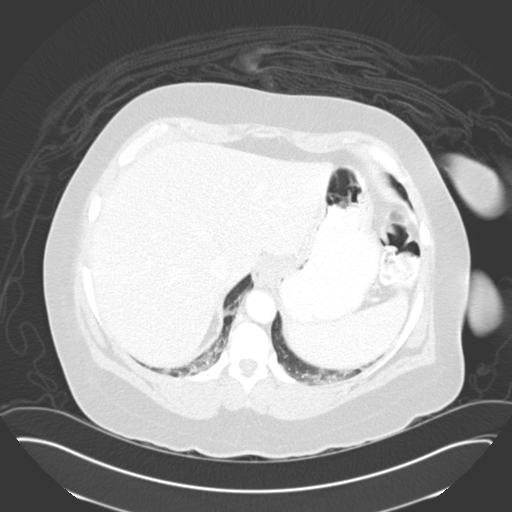
[im 143/155  lung]
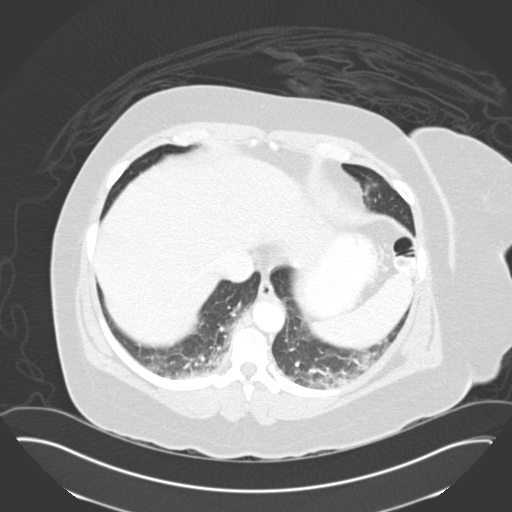
[im 149/155  soft-tissue]
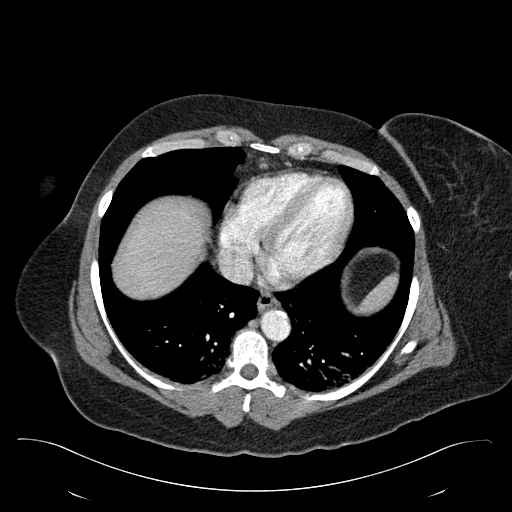
[im 149/155  lung]
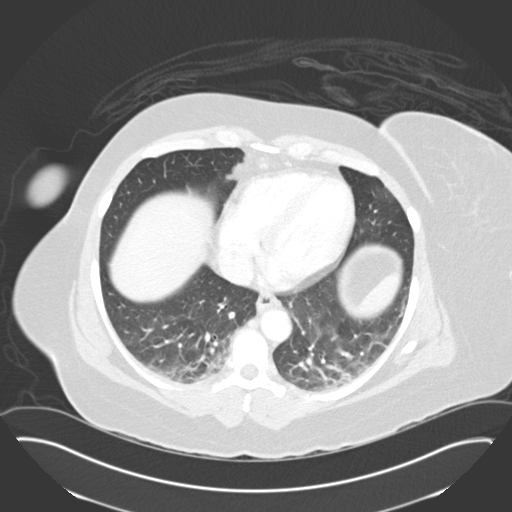

[16 of 32 positions shown; findings below may reference images not displayed]

PROCEDURE:     CT  - CT ABDOMEN / PELVIS  W  - March 21, 2006  [DATE]

RESULT:

REASON FOR CONSULTATION:    Abdominal pain.   The report was faxed to the
Emergency Room.

The liver and spleen appear intact.  No focal masses are seen.  Both kidneys
excrete the contrast material with no masses, no hydronephrosis.  The
pancreas appears intact.  The patient is status post cholecystectomy. No
ascites is present in the abdomen. There appears ovarian cyst on the LEFT.
No diverticulitis.  The patient has had prior appendectomy.  No abdominal
wall hernias are seen.

Some dependent atelectasis is noted in the lung bases with no effusions.
IMPRESSION: LEFT ovarian cyst.   No additional abnormalities are detected.

Report was faxed to the Emergency Room.

## 2008-03-01 IMAGING — CT CT OF THE RIGHT HIP WITHOUT CONTRAST
1 series · 16 of 32 positions shown, 20 images · non-contrast
Comparison: none

REASON FOR EXAM: pain rt groin worse w/ moving leg. fracture??
COMMENTS:

[Series 2: hip 3.0 b70s · axial · 0.73mm/px · z∈[-242,-116]mm · 16 of 48 slices shown, 20 images]
[im 4/48  soft-tissue]
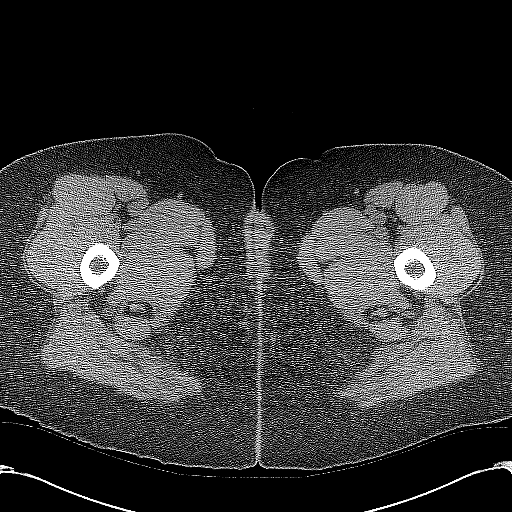
[im 4/48  bone]
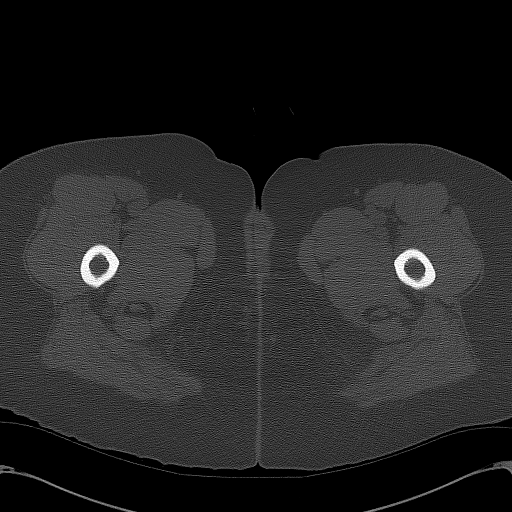
[im 7/48  soft-tissue]
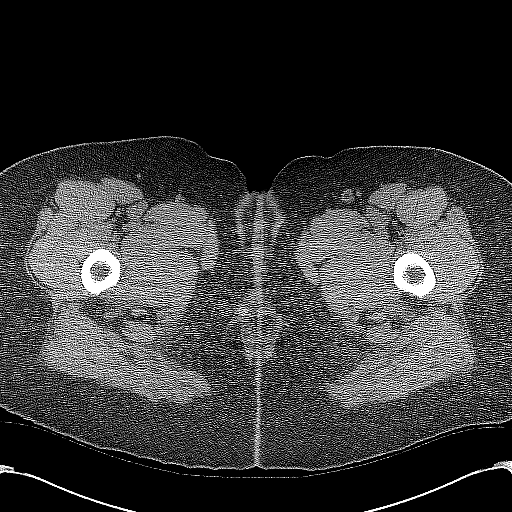
[im 10/48  soft-tissue]
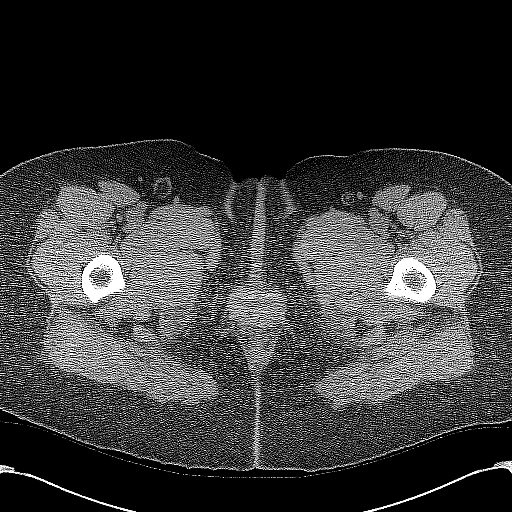
[im 13/48  soft-tissue]
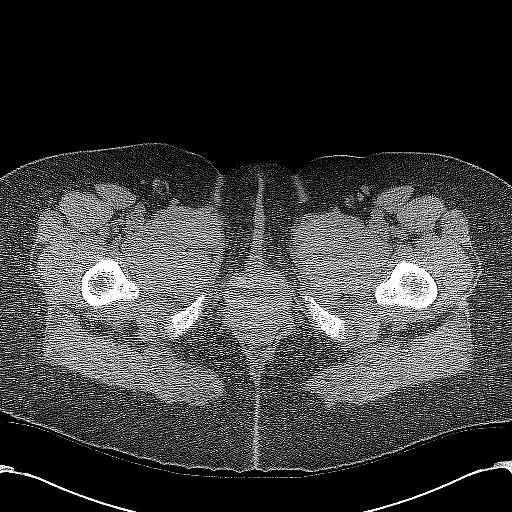
[im 16/48  soft-tissue]
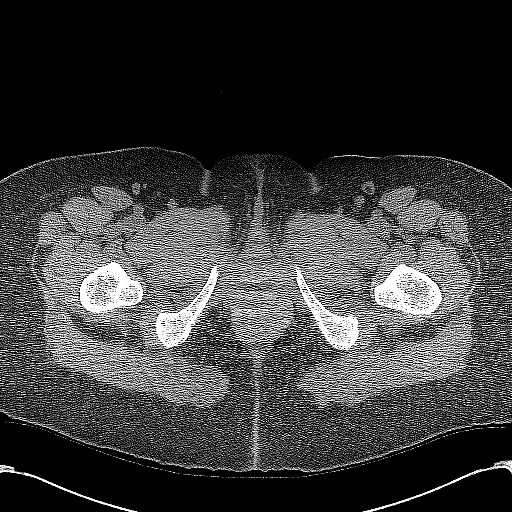
[im 19/48  soft-tissue]
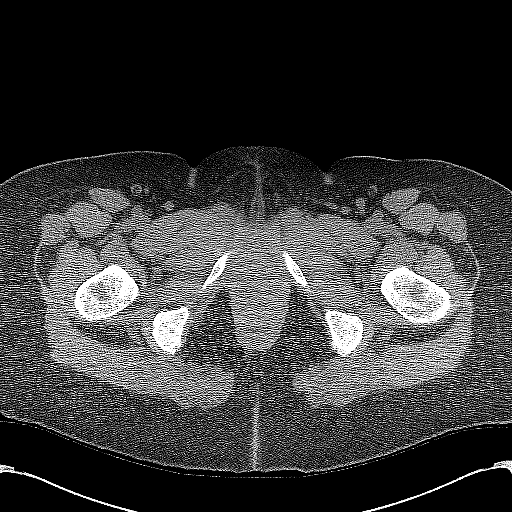
[im 22/48  soft-tissue]
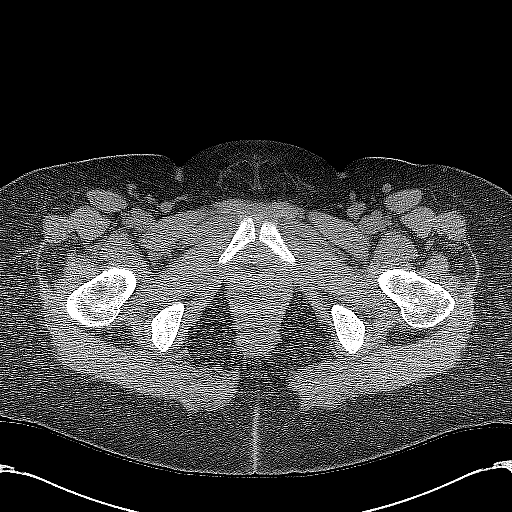
[im 26/48  soft-tissue]
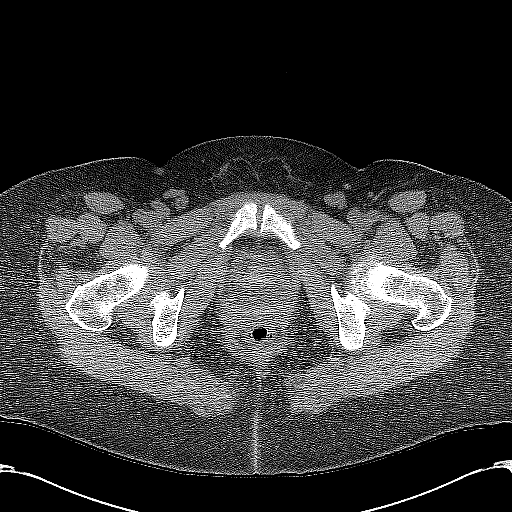
[im 29/48  soft-tissue]
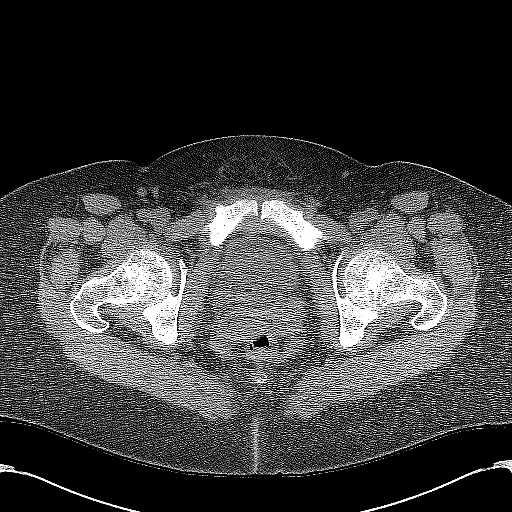
[im 29/48  bone]
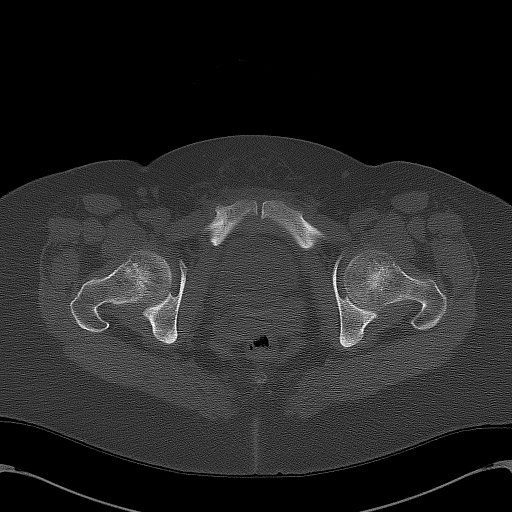
[im 32/48  soft-tissue]
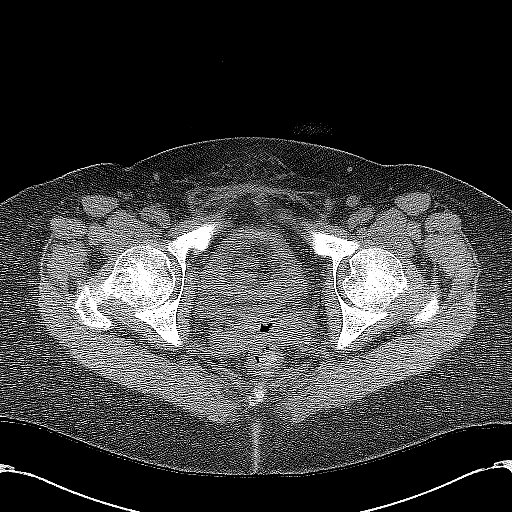
[im 35/48  soft-tissue]
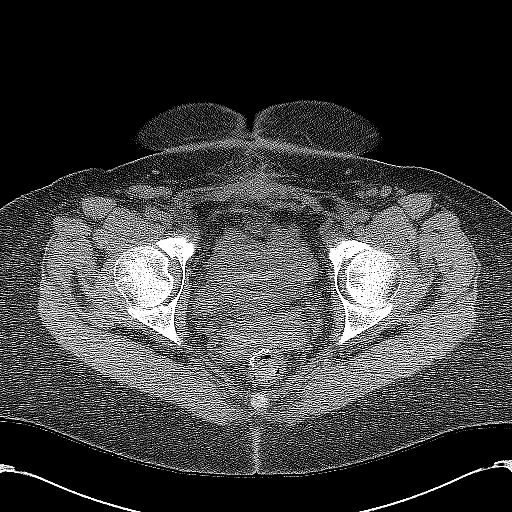
[im 38/48  soft-tissue]
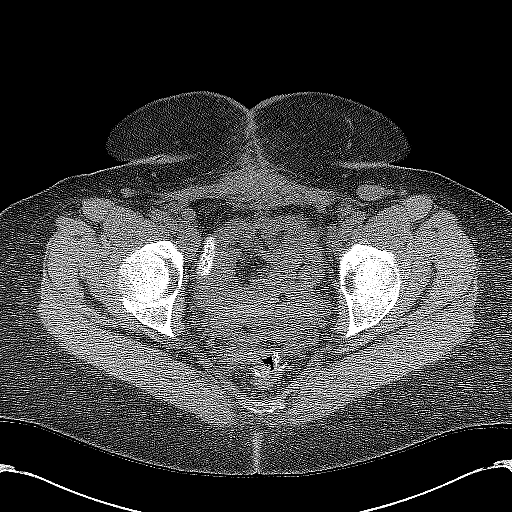
[im 41/48  soft-tissue]
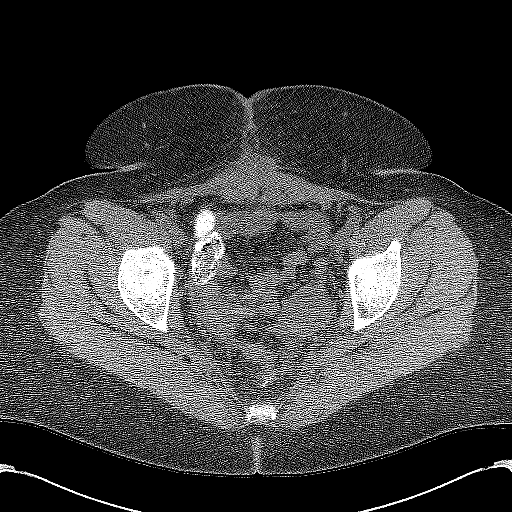
[im 41/48  lung]
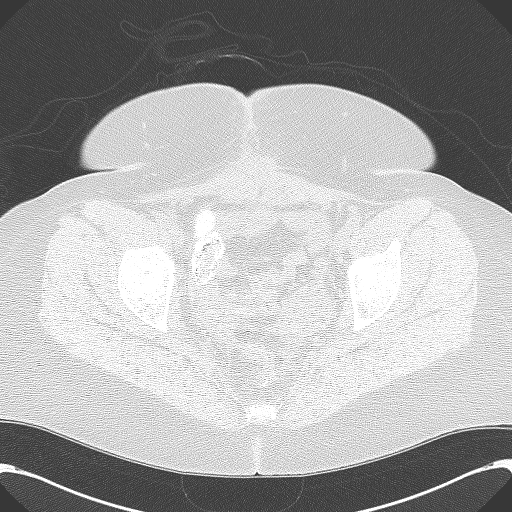
[im 43/48  lung]
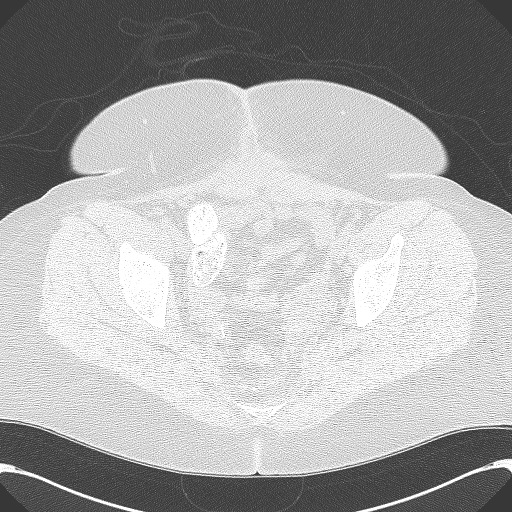
[im 44/48  soft-tissue]
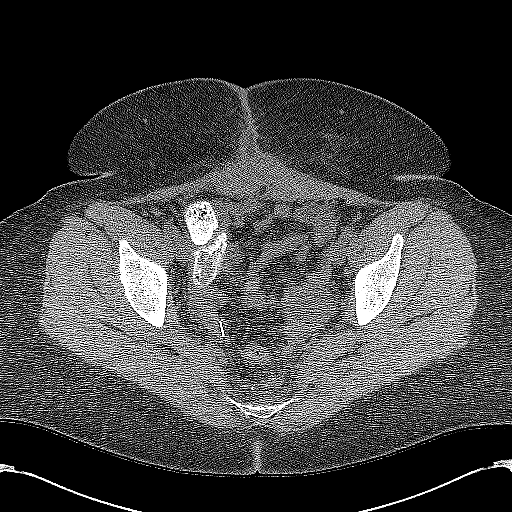
[im 44/48  lung]
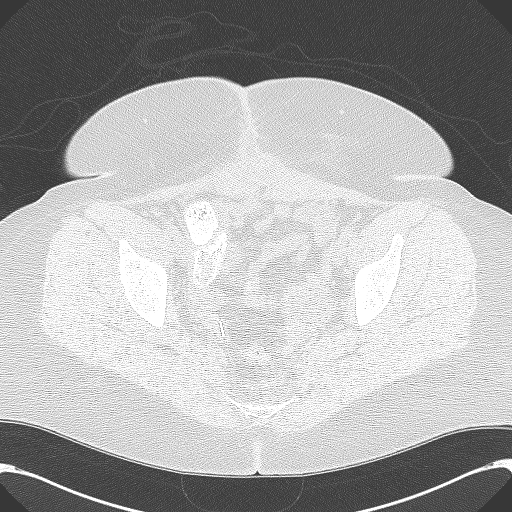
[im 46/48  lung]
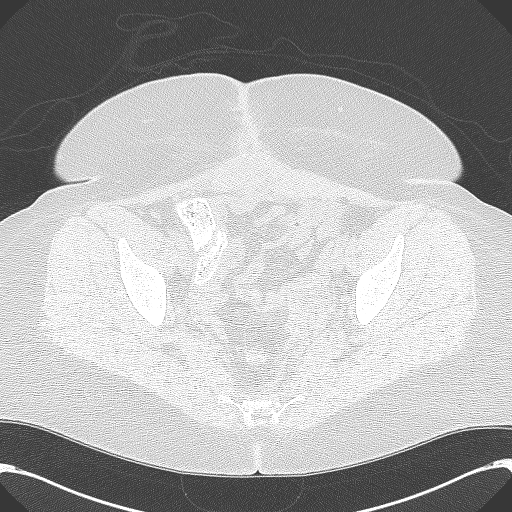

[16 of 32 positions shown; findings below may reference images not displayed]

PROCEDURE:     CT  - CT HIP RIGHT WITHOUT CONTRAST  - March 22, 2006  [DATE]

RESULT:     The patient is complaining of pain in the RIGHT groin and is
being evaluated for a possible fracture.

The femoral head and neck on the RIGHT are intact.  The intertrochanteric
region is also normal in appearance.  The acetabulum is intact.  I do not
see evidence of widening of the joint space.  The superior and inferior
pubic rami are intact.
IMPRESSION: I do not see evidence of a fracture of the RIGHT hip nor
evidence of significant joint effusion.  The surrounding soft tissues of the
hip appear intact as well.  If the patient's symptoms persist, further
evaluation with a nuclear bone scan may be the most useful next step.  If
there are clinical findings that might indicate retroperitoneal
abnormalities which can be exacerbated by movement at the hip, CT scanning
of the abdomen and pelvis could be considered.

## 2008-03-02 IMAGING — CR DG ABDOMEN 2V
1 series · 4 of 4 positions shown · non-contrast
Comparison: none

REASON FOR EXAM: Ileus
COMMENTS:

[Series 1: view not recorded · 0.17mm/px · 4 of 4 slices shown]
[im 1/4]
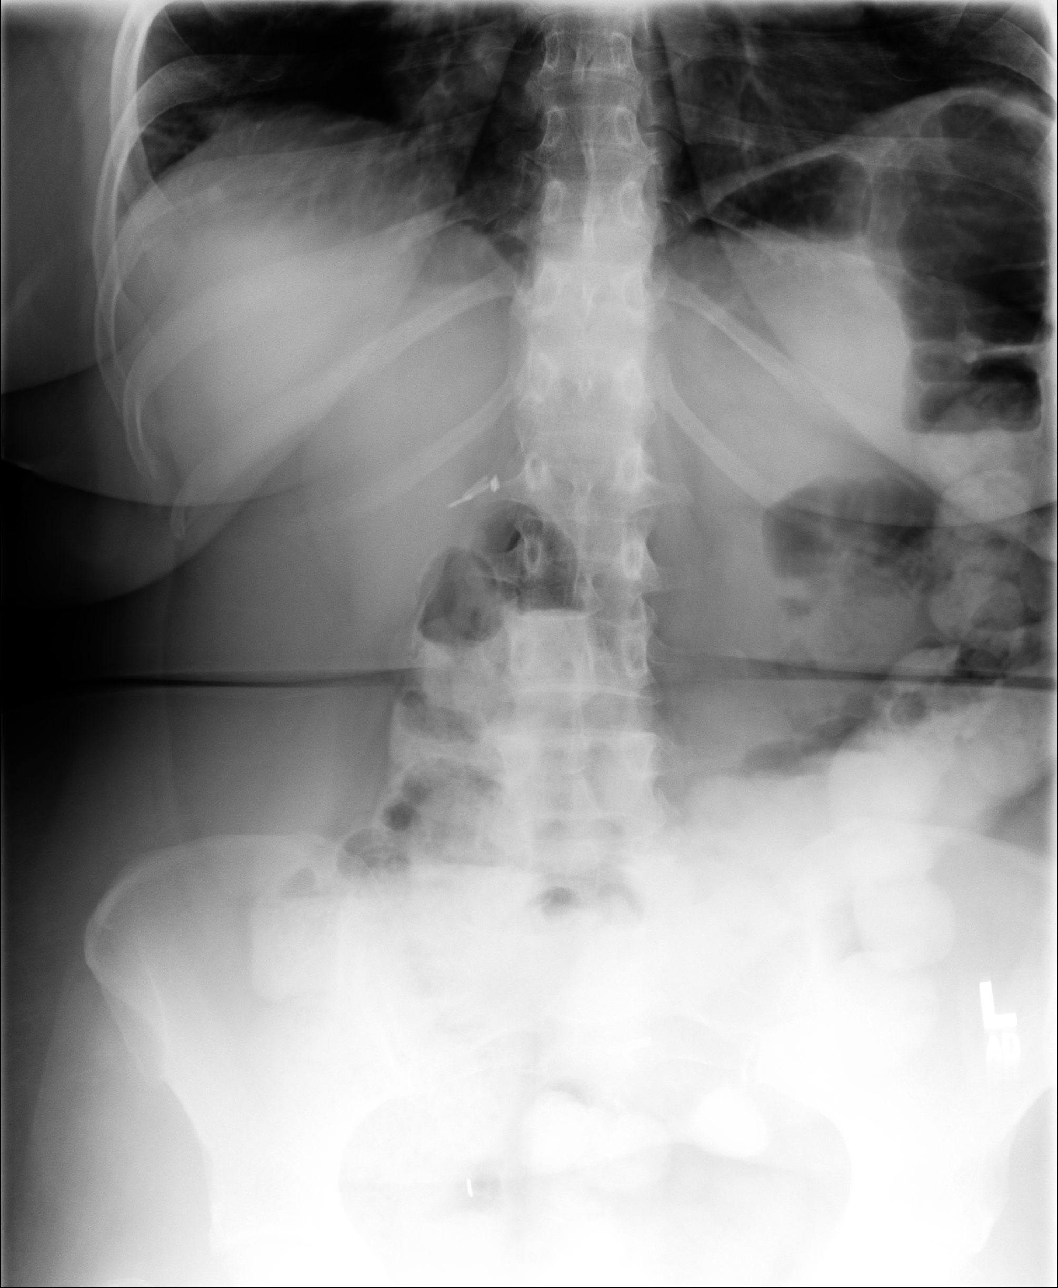
[im 2/4]
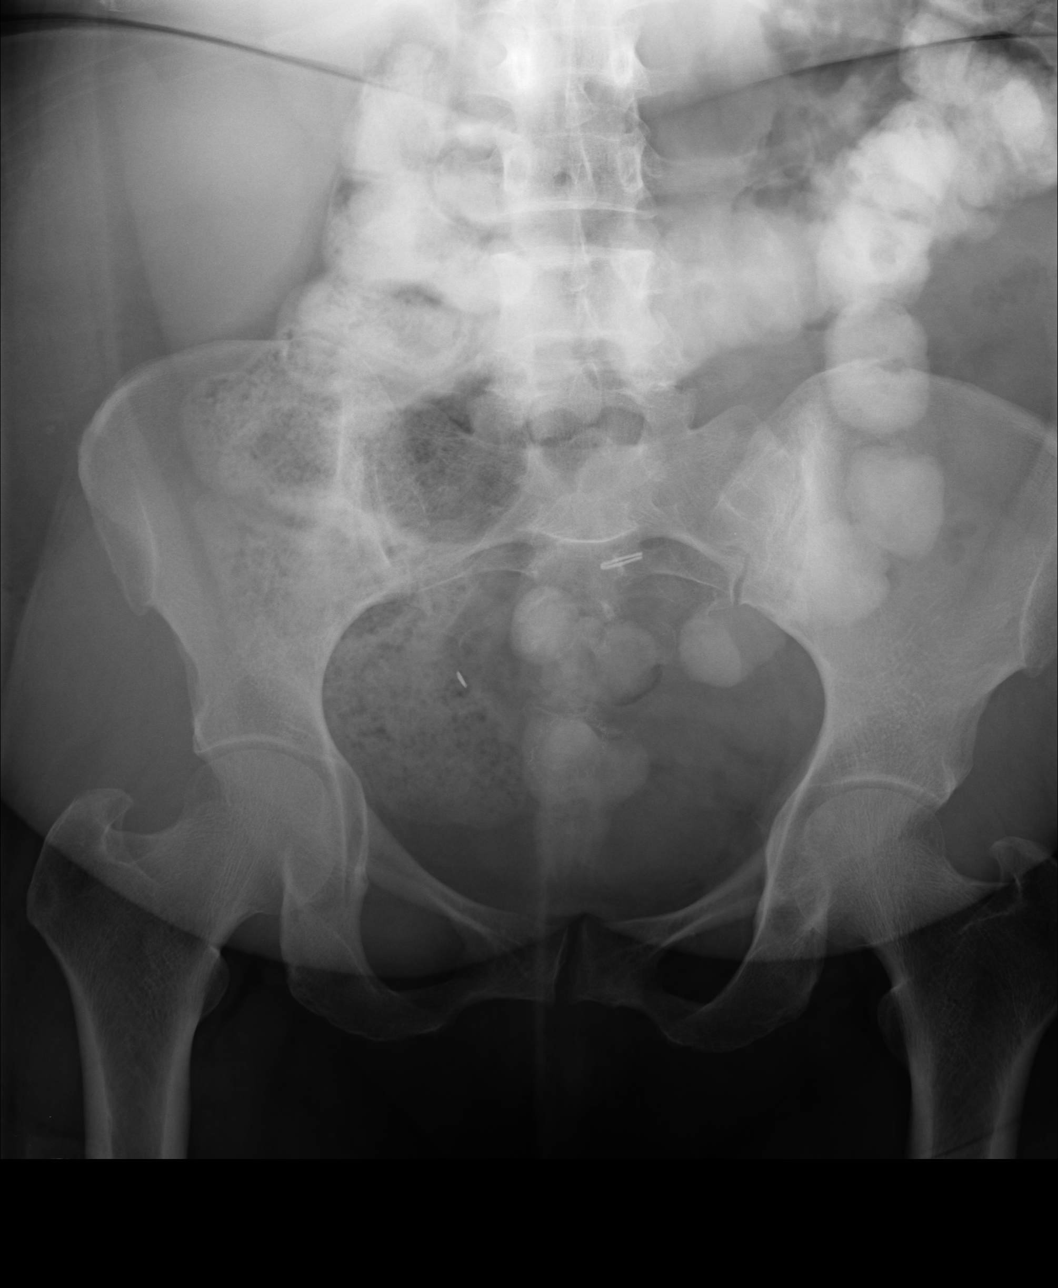
[im 3/4]
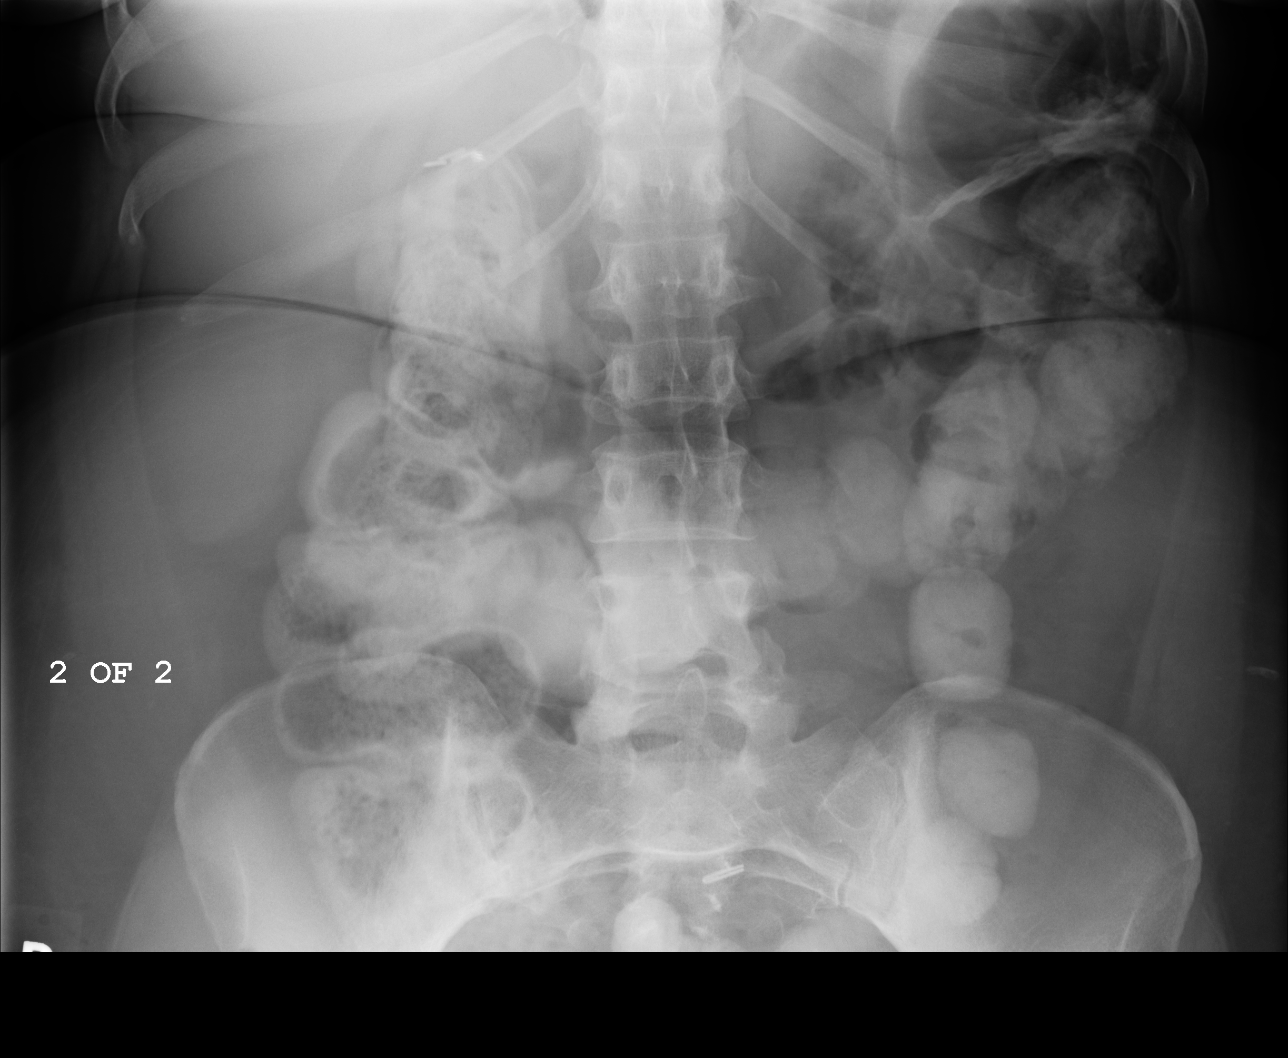
[im 4/4]
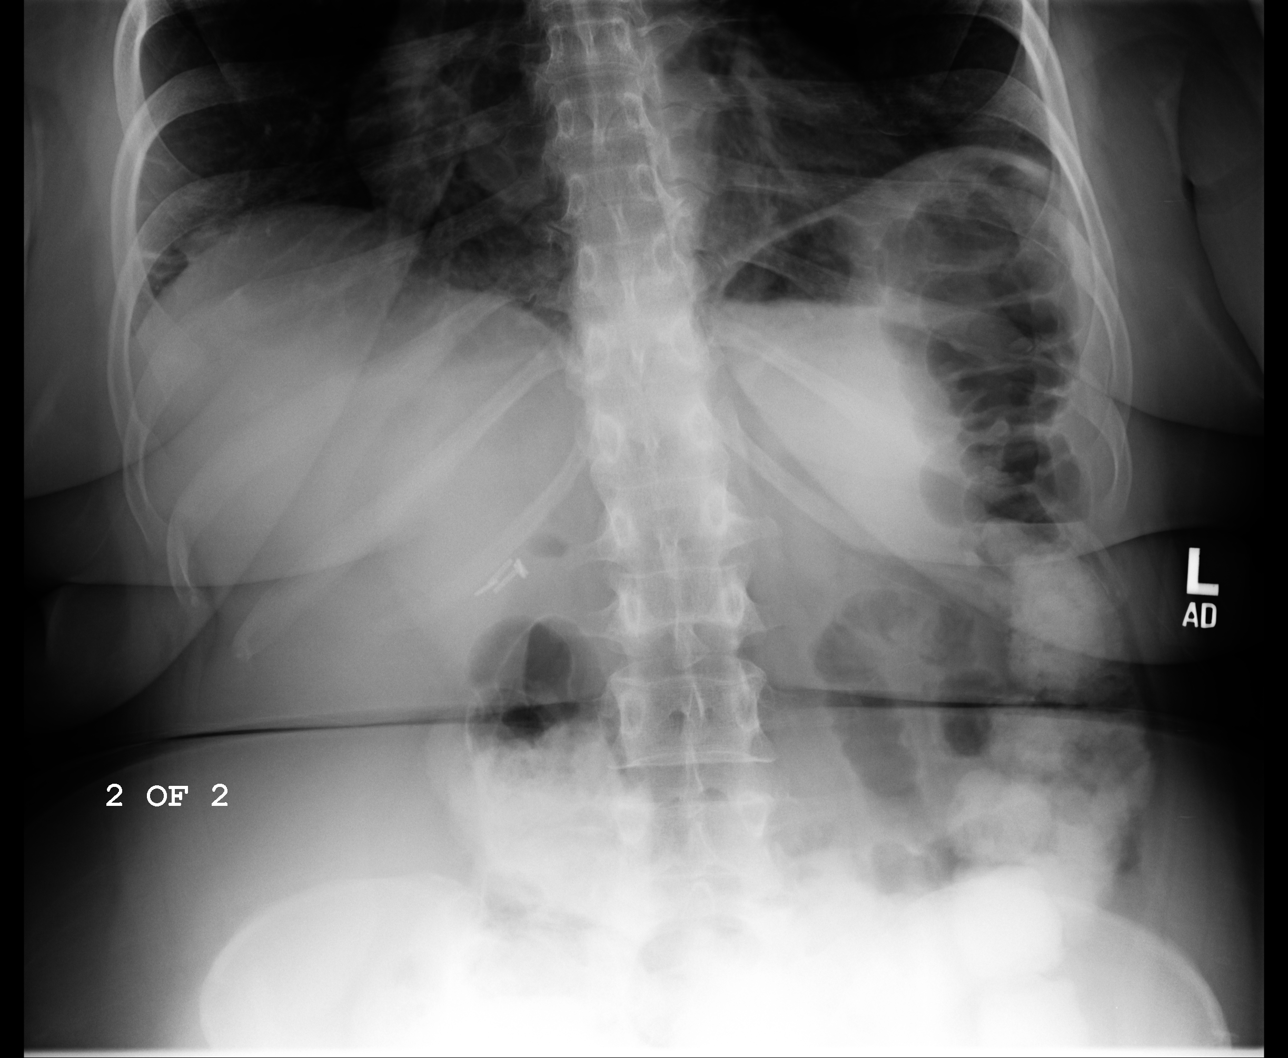

[4 of 4 positions shown; findings below may reference images not displayed]

PROCEDURE:     DXR - DXR ABDOMEN 2 V FLAT AND ERECT  - March 23, 2006 [DATE]

RESULT:     There is a moderate amount of contrast present from a prior
contrast study.  The contrast is largely within the colon and has reached
the rectum.  There is a moderate amount of gas within the LEFT colon.  There
is stool in the RIGHT colon.  There are surgical clips in the gallbladder
fossa.  I do not see extraluminal gas collections.
IMPRESSION: There is no evidence of obstruction or ileus.  If the patient's condition
warrants further evaluation, CT scanning would be a useful next step.

## 2008-03-24 ENCOUNTER — Ambulatory Visit: Payer: Self-pay | Admitting: Unknown Physician Specialty

## 2008-04-17 IMAGING — CR DG CHEST 2V
1 series · 2 of 2 positions shown · non-contrast
Comparison: none

REASON FOR EXAM: Chest sob
COMMENTS:

[Series 1: view not recorded · 0.17mm/px · 2 of 2 slices shown]
[im 1/2]
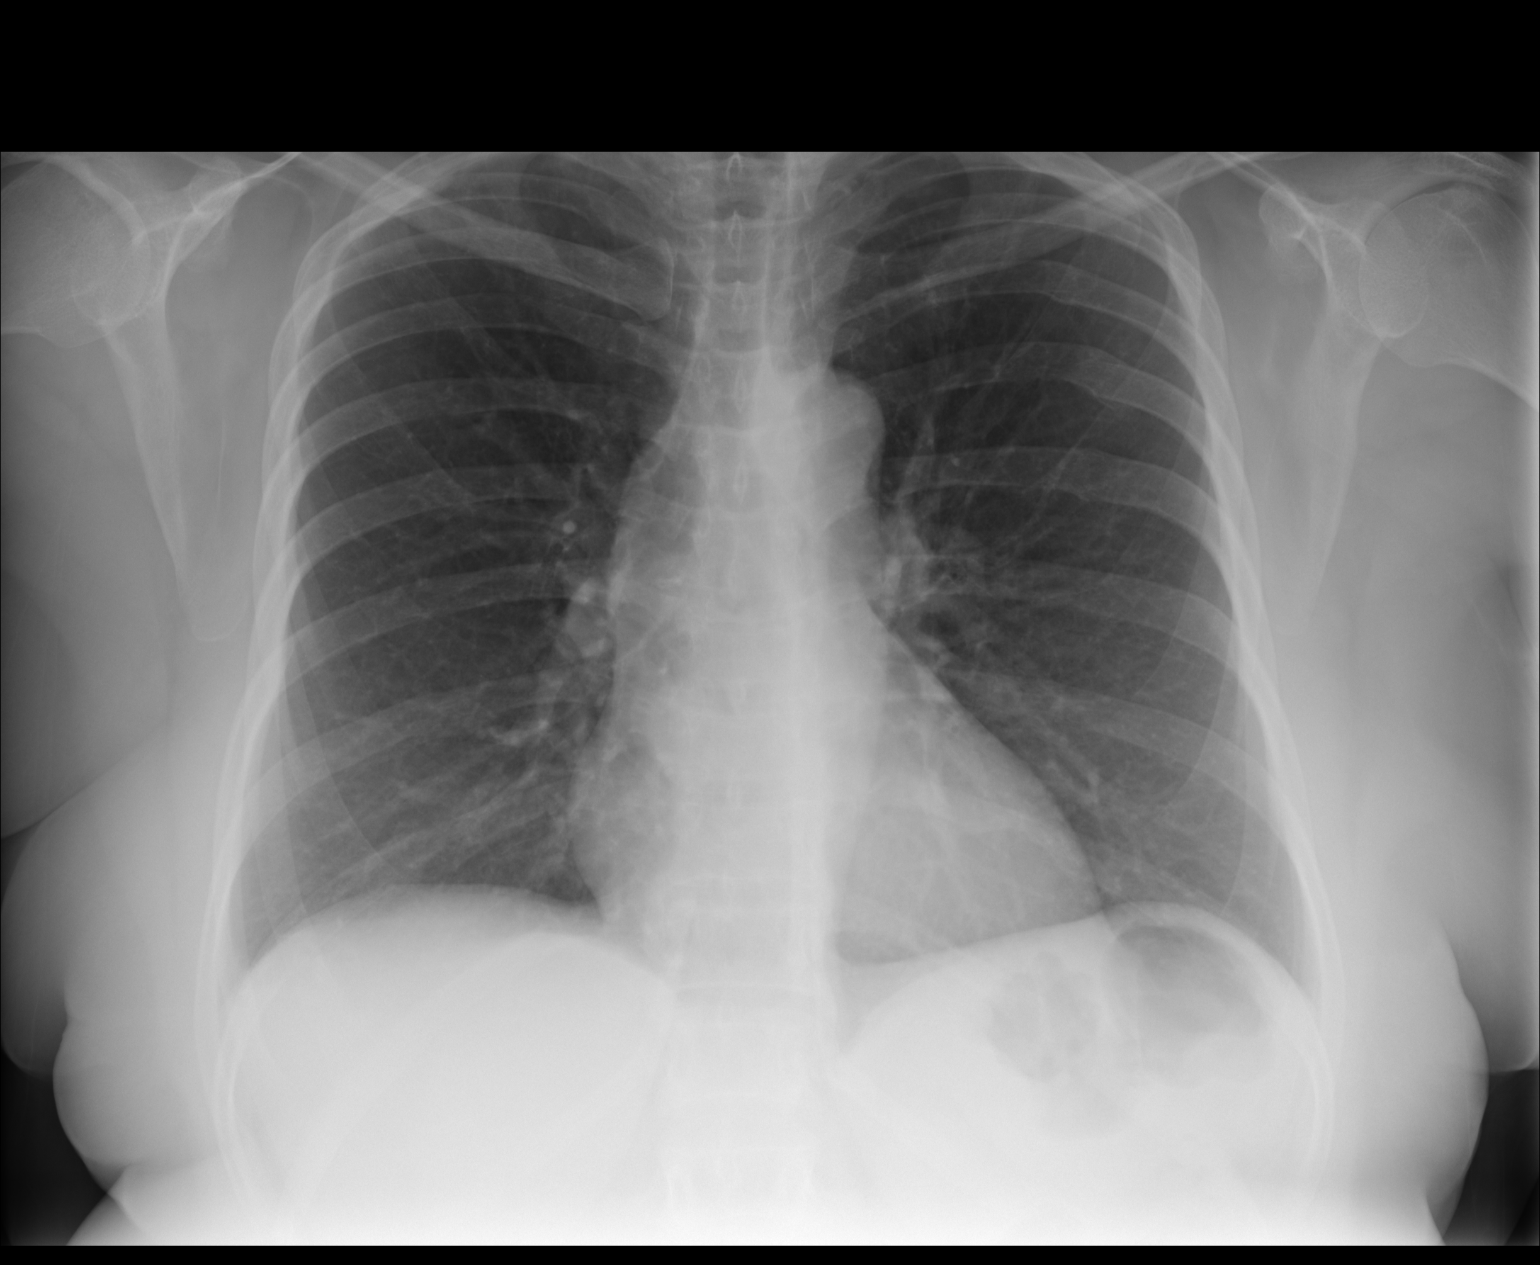
[im 2/2]
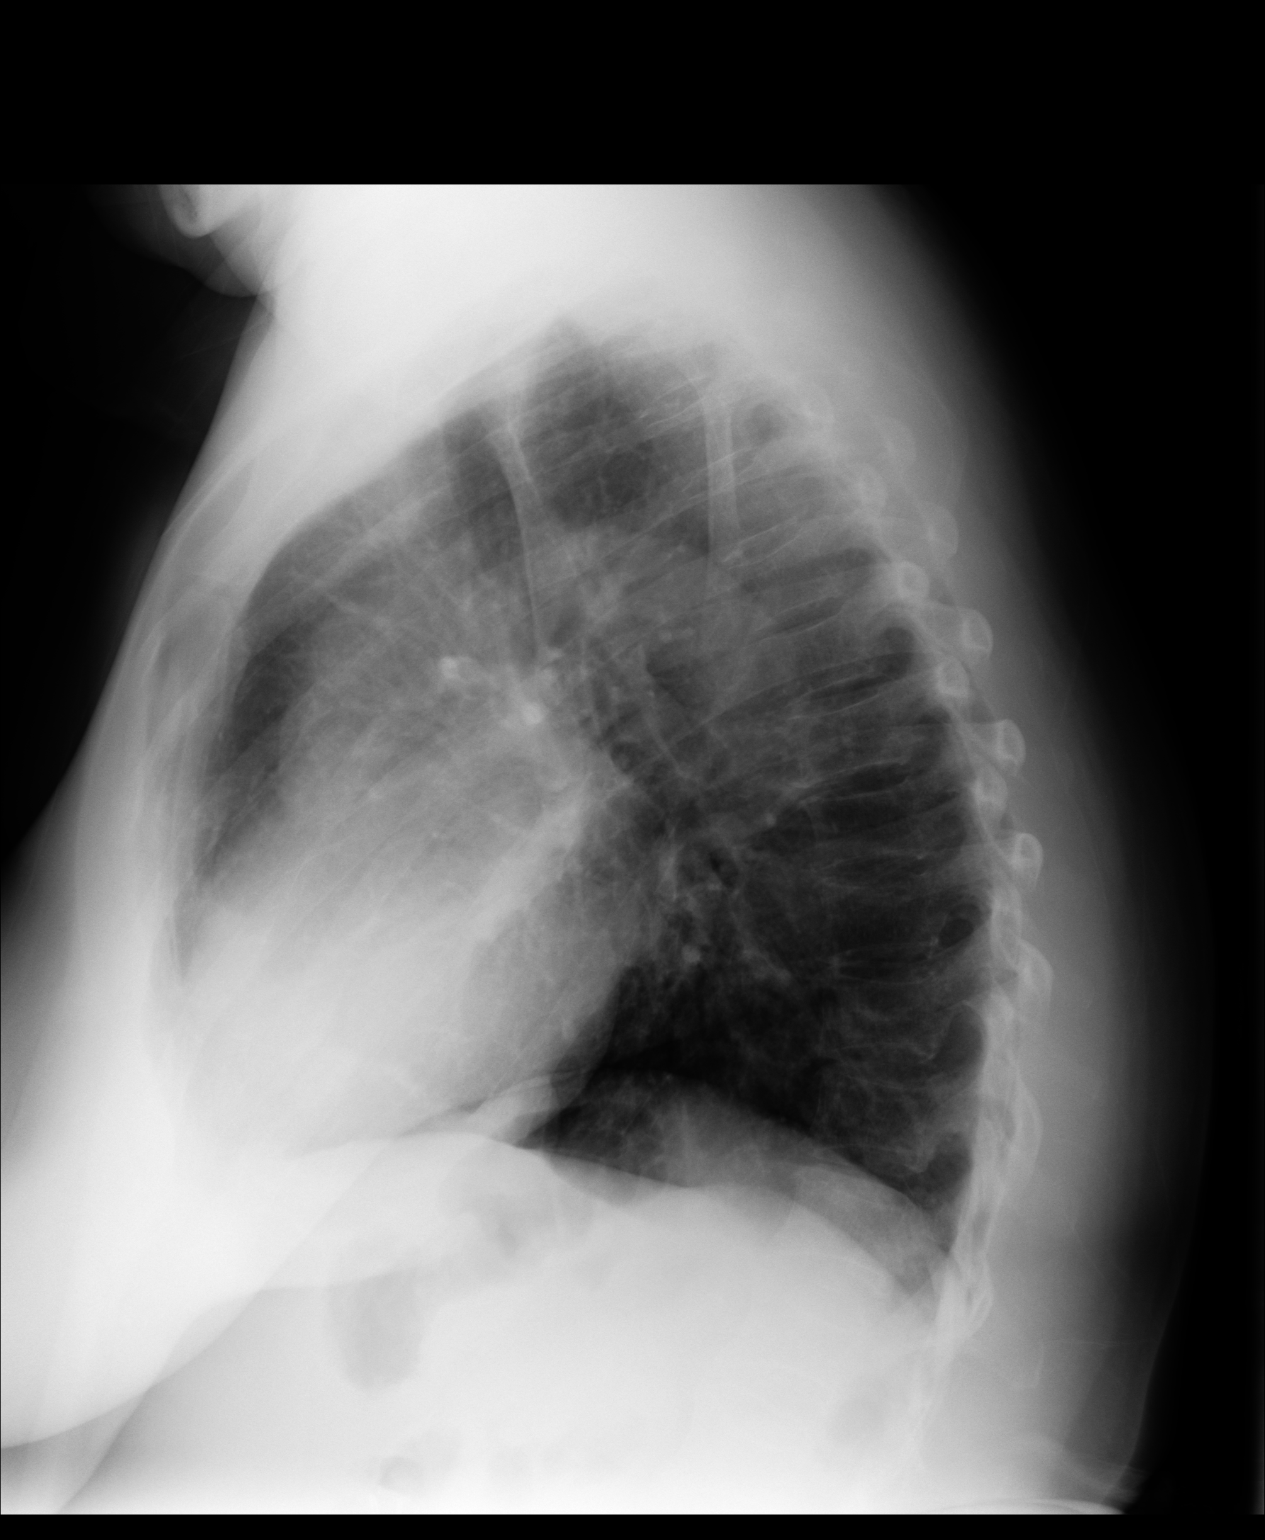

[2 of 2 positions shown; findings below may reference images not displayed]

PROCEDURE:     DXR - DXR CHEST PA (OR AP) AND LATERAL  - May 08, 2006  [DATE]

RESULT:       This is a redictation as the initial dictation was not
received by the dictation system.

The mediastinum and hilar structures are normal.  The lungs are clear.  The
cardiovascular structures are unremarkable.
IMPRESSION: No acute cardiopulmonary disease.

## 2008-06-11 IMAGING — CT CT HEAD WITHOUT CONTRAST
2 series · 16 of 30 positions shown, 20 images · non-contrast
Comparison: none

REASON FOR EXAM: SEIZURE//RM 5
COMMENTS:

[Series 2: without · axial · non-contrast · 0.44mm/px · z∈[-196,-76]mm · 13 of 30 slices shown, 17 images]
[im 3/30  brain]
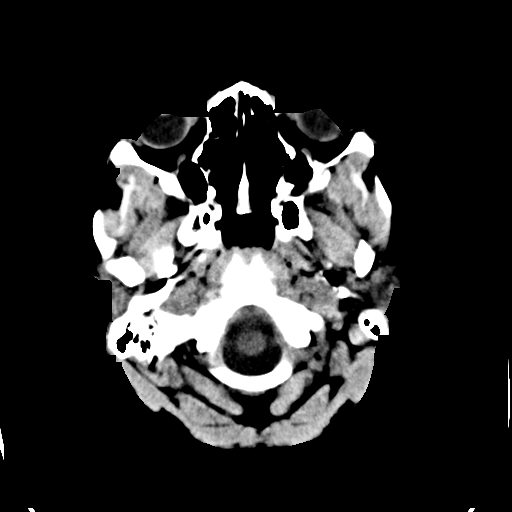
[im 3/30  bone]
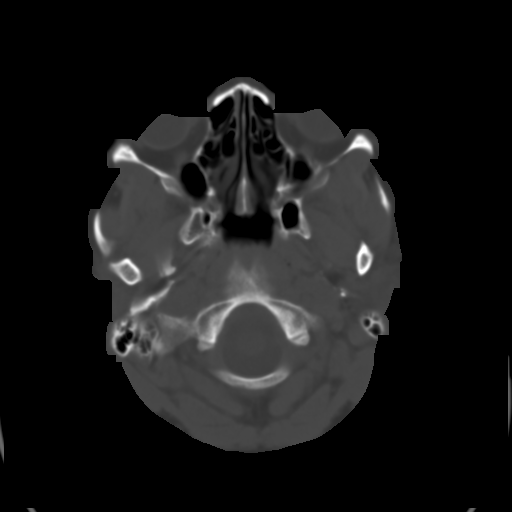
[im 5/30  brain]
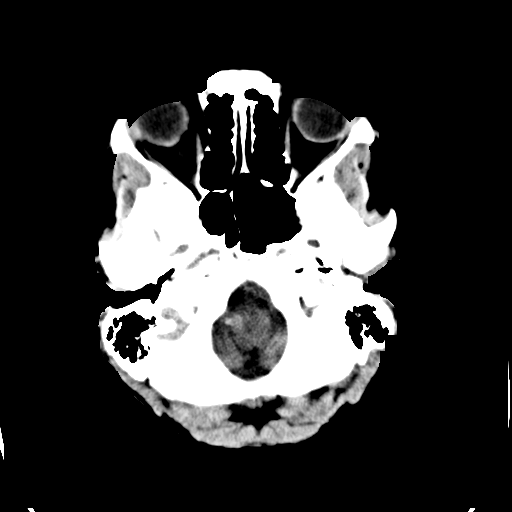
[im 7/30  brain]
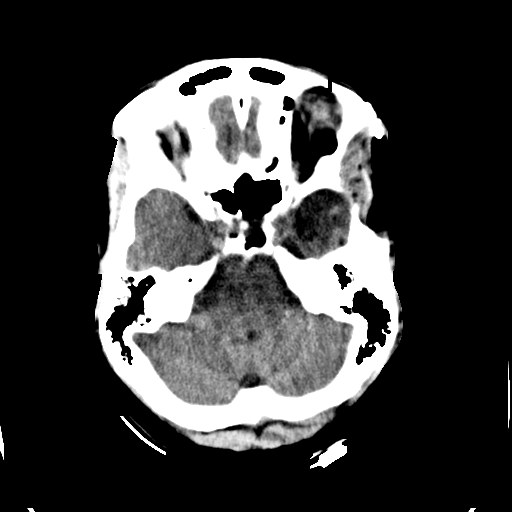
[im 9/30  brain]
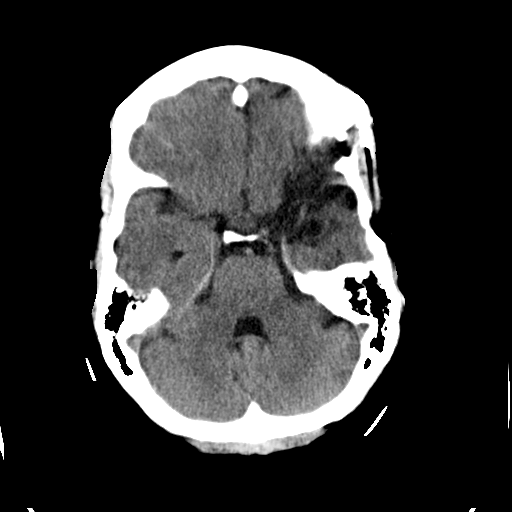
[im 11/30  brain]
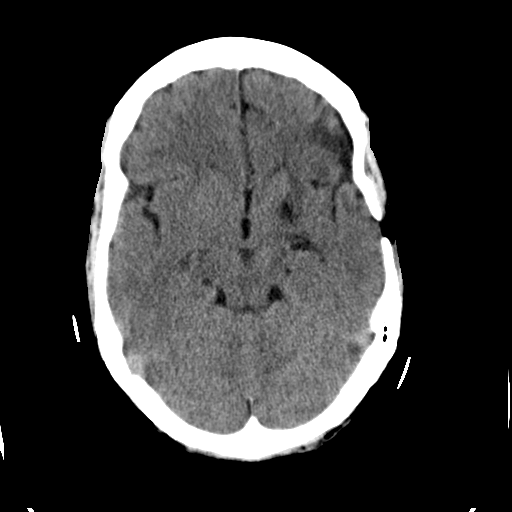
[im 11/30  bone]
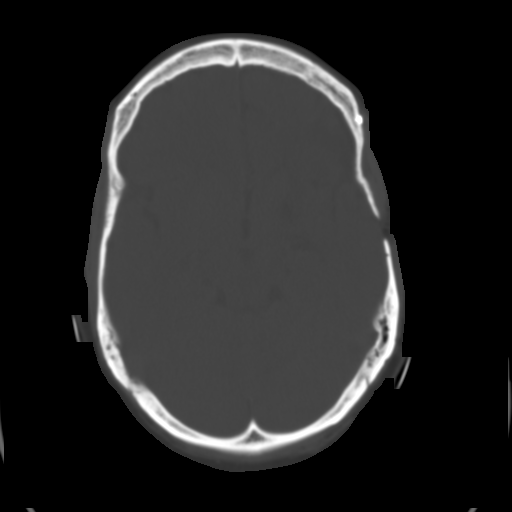
[im 13/30  brain]
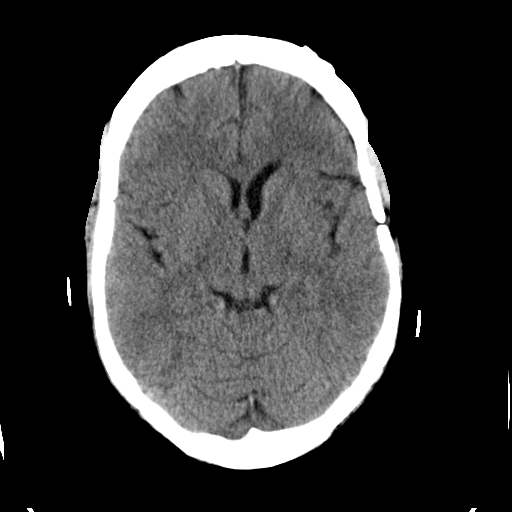
[im 15/30  brain]
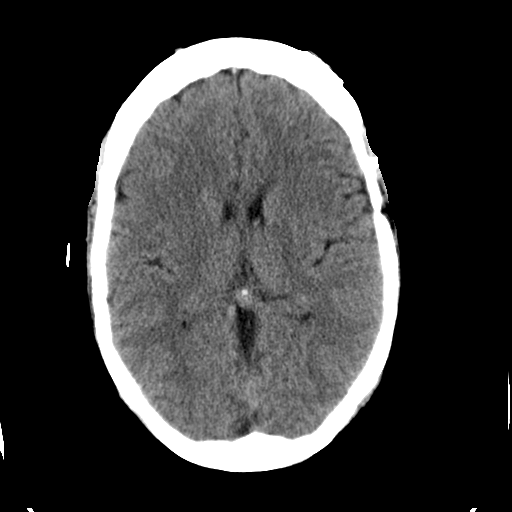
[im 17/30  brain]
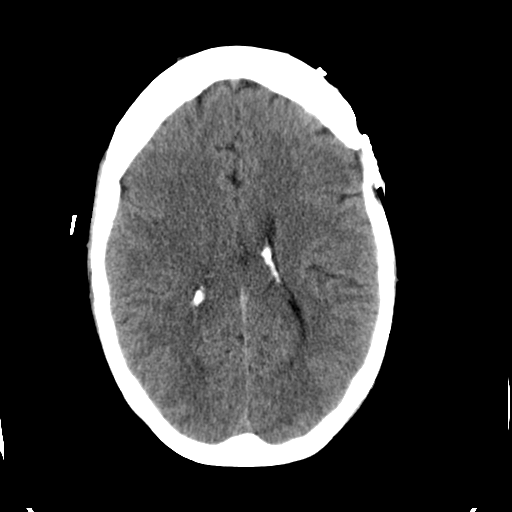
[im 19/30  brain]
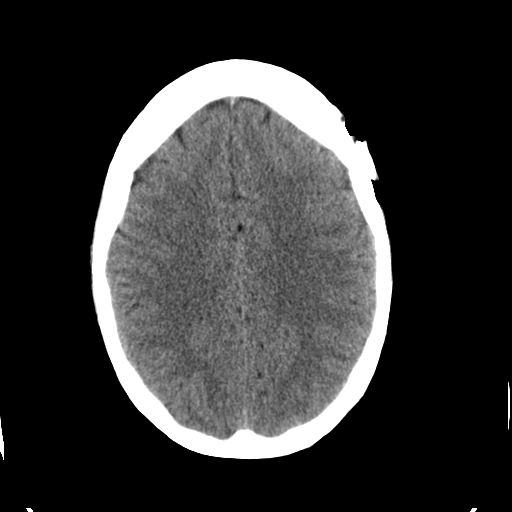
[im 19/30  bone]
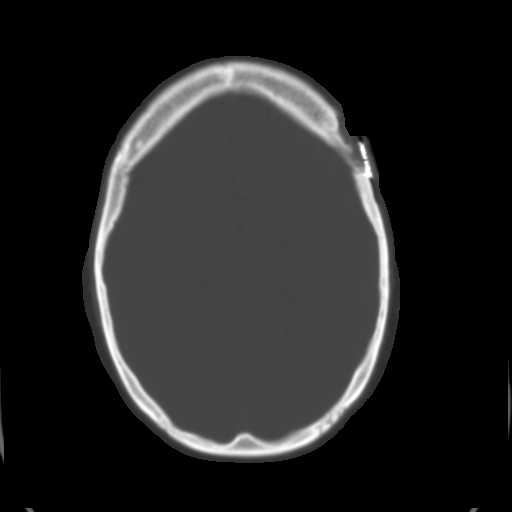
[im 21/30  brain]
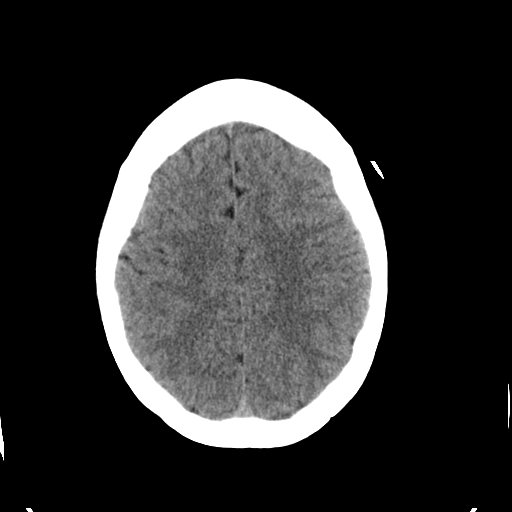
[im 23/30  brain]
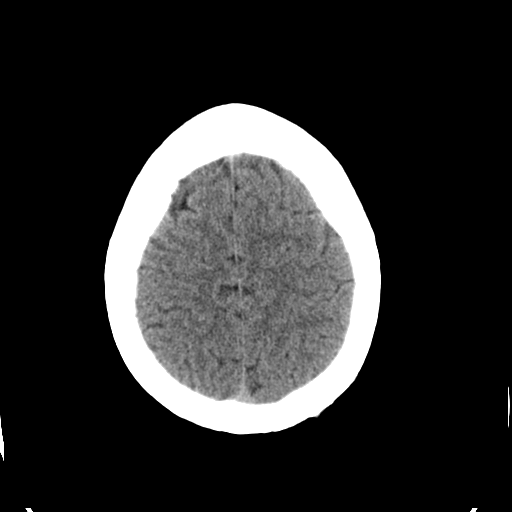
[im 25/30  brain]
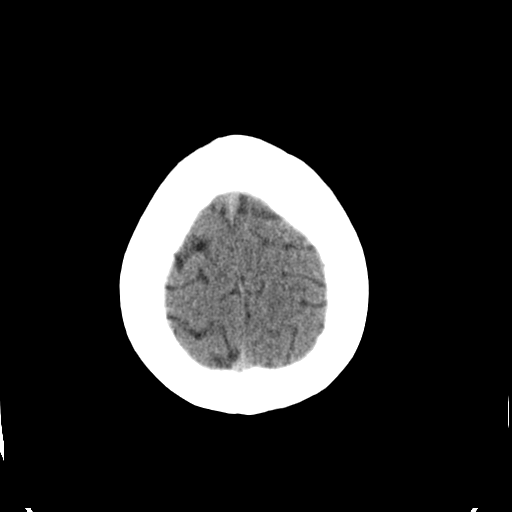
[im 27/30  brain]
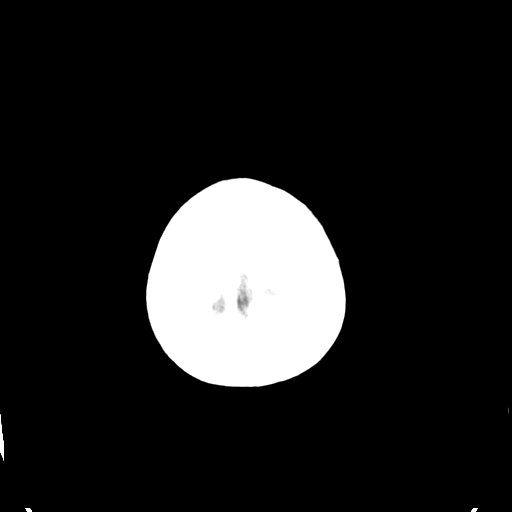
[im 27/30  bone]
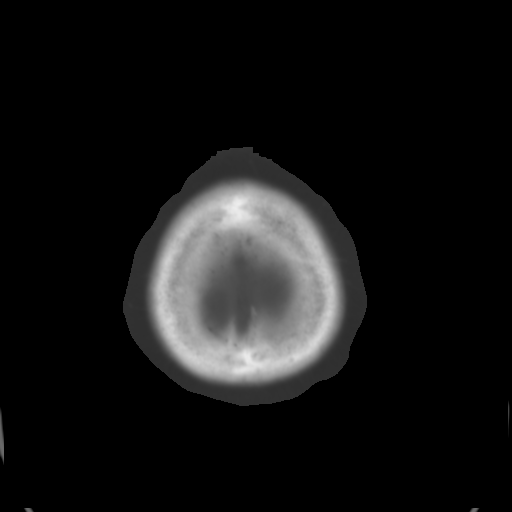

[Series 3: bone · axial · 0.44mm/px · z∈[-196,-156]mm · 3 of 30 slices shown]
[im 3/30  bone]
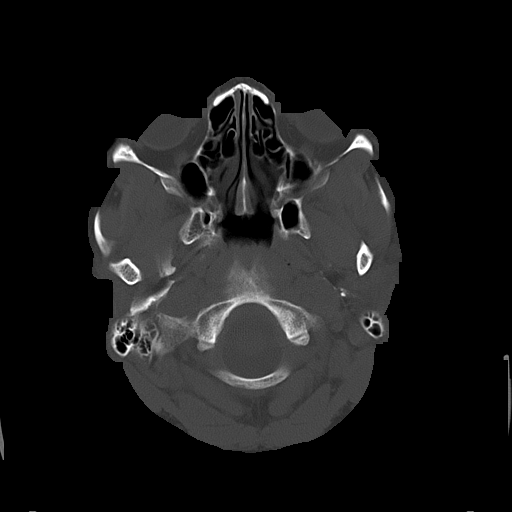
[im 7/30  bone]
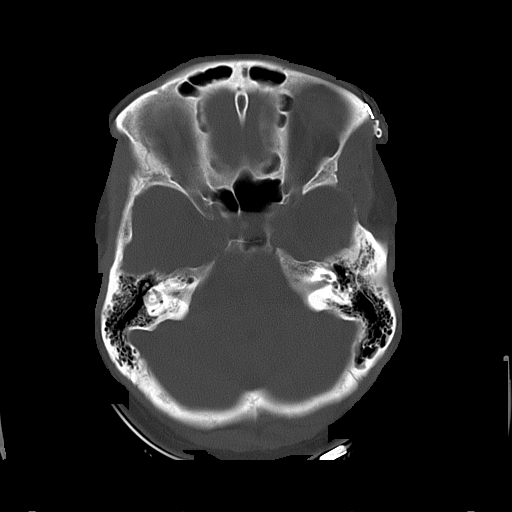
[im 11/30  bone]
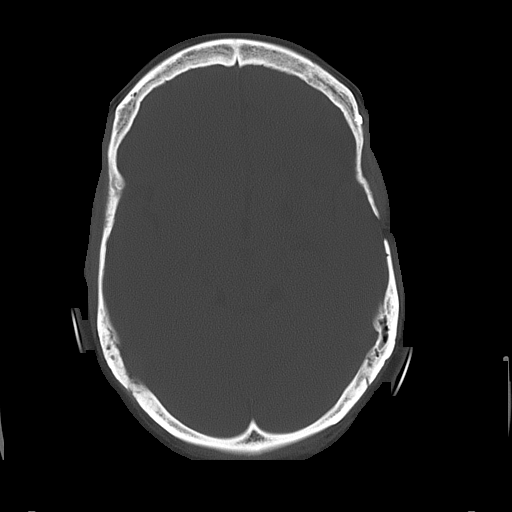

[16 of 30 positions shown; findings below may reference images not displayed]

PROCEDURE:     CT  - CT HEAD WITHOUT CONTRAST  - July 02, 2006  [DATE]

RESULT:         The patient has a history of seizure activity and has
undergone prior craniectomy for brain tumor.

There is a craniectomy defect on the LEFT in the frontoparietal region.
Deep to this there is a pattern of encephalomalacia. There is no shift of
the midline.  I do not see evidence of an intracranial hemorrhage.  The
ventricles are normal in size and position.   The cerebellum and brainstem
are normal in appearance.
IMPRESSION: 1.     I do not see evidence of recurrent malignancy within the brain nor
evidence of an acute ischemic or hemorrhagic event.
2.     There is no evidence of hydrocephalus.  There are post surgical
changes present.

A preliminary report was sent to the Emergency Room at the conclusion of the
study.

## 2008-06-20 ENCOUNTER — Ambulatory Visit: Payer: Self-pay | Admitting: Specialist

## 2008-09-30 IMAGING — CR DG CHEST 1V PORT
1 series · 1 of 1 positions shown · non-contrast
Comparison: none

REASON FOR EXAM: cp, rm 16
COMMENTS:

PROCEDURE:     DXR - DXR PORTABLE CHEST SINGLE VIEW  - October 21, 2006 [DATE]
RESULT:      Portable chest is compared to a prior exam of 05/08/2006. The
lung fields are clear.  No pneumonia, pneumothorax or pleural effusion is
seen.  .

[view not recorded]
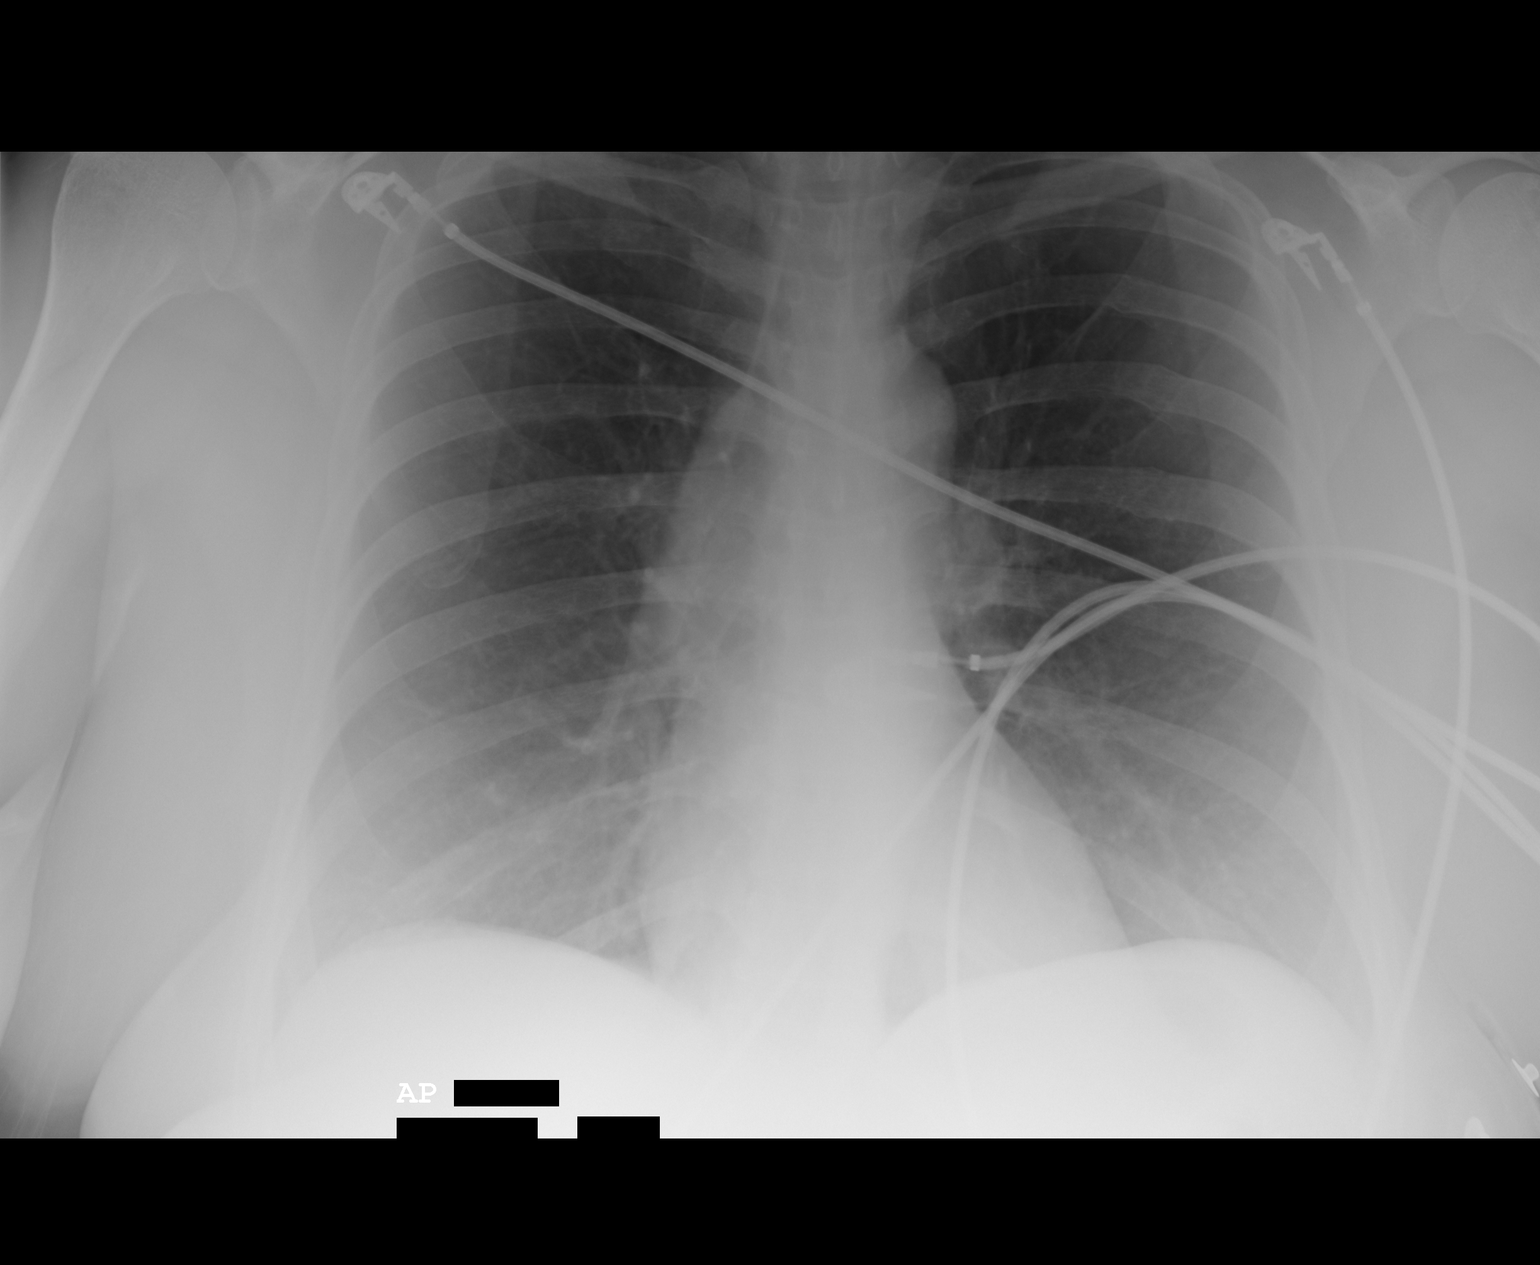

[1 of 1 positions shown; findings below may reference images not displayed]

IMPRESSION: No acute changes are identified.

## 2008-10-04 IMAGING — CR DG CHEST 2V
1 series · 2 of 2 positions shown · non-contrast
Comparison: none

REASON FOR EXAM: rib pain
COMMENTS:

[Series 1: view not recorded · 0.17mm/px · 2 of 2 slices shown]
[im 1/2]
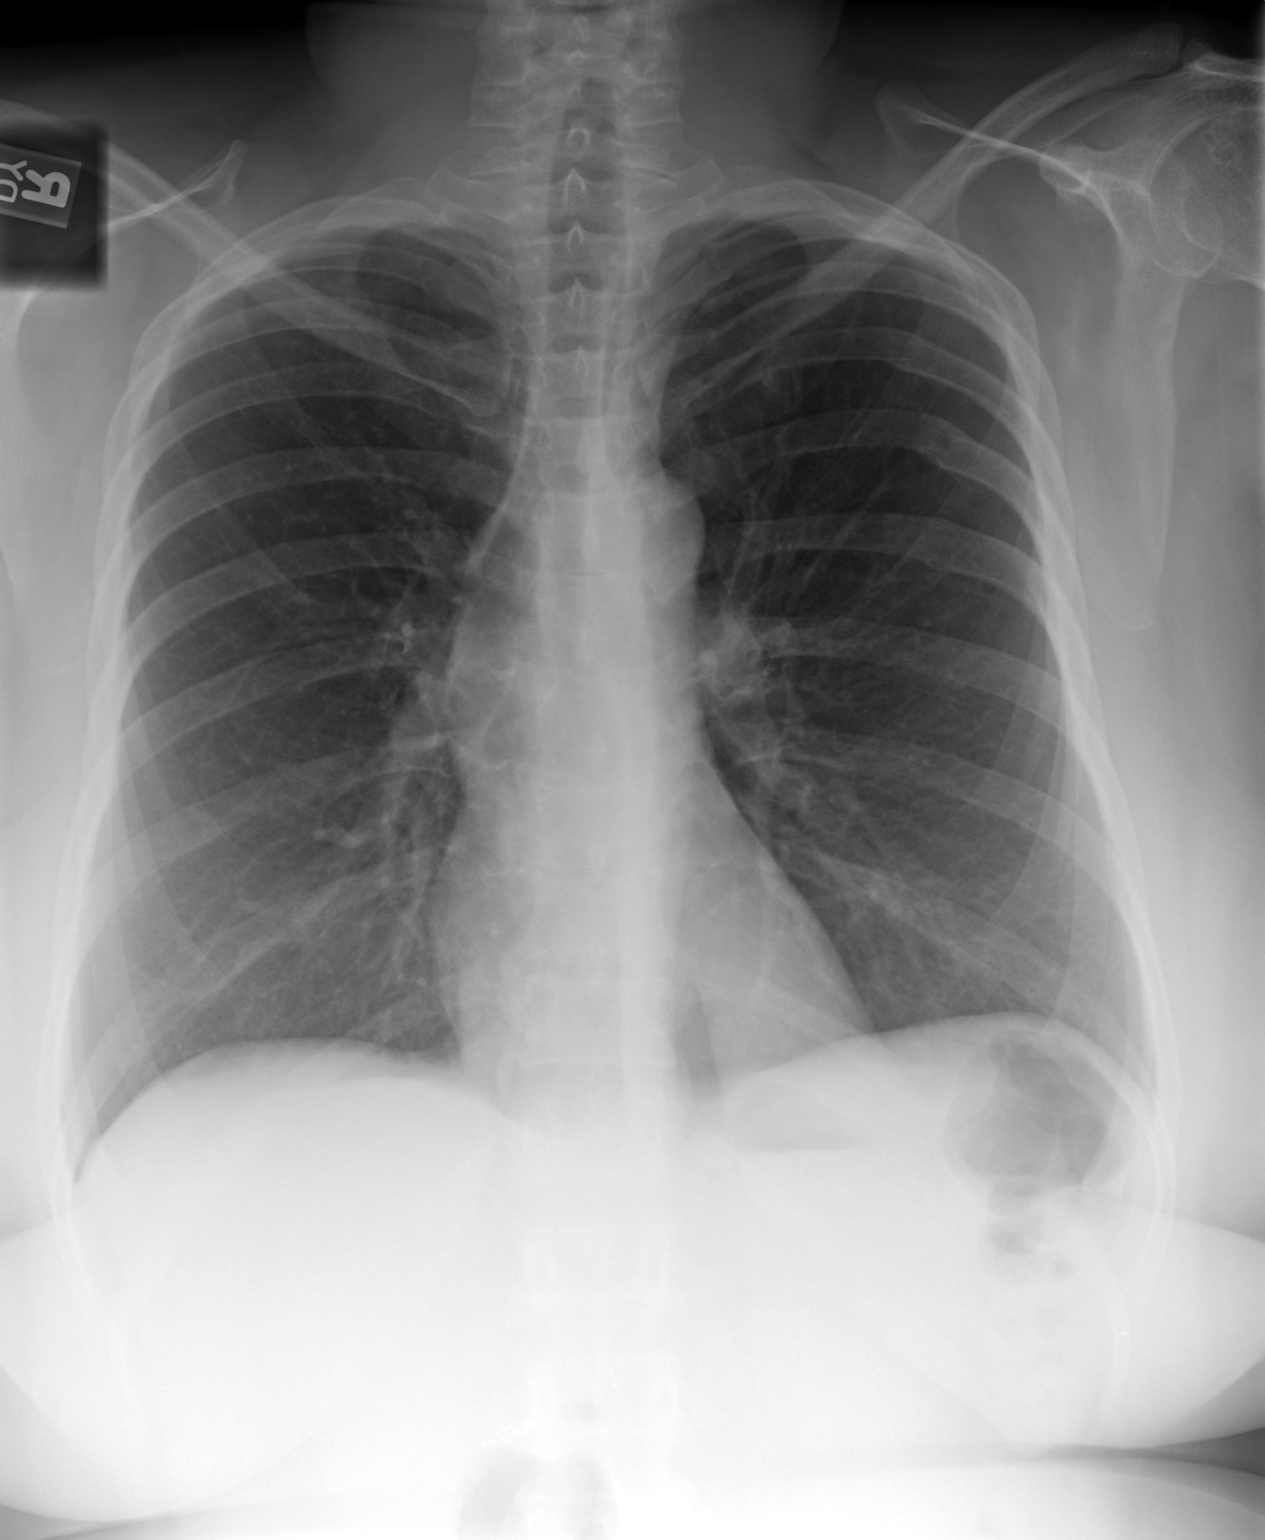
[im 2/2]
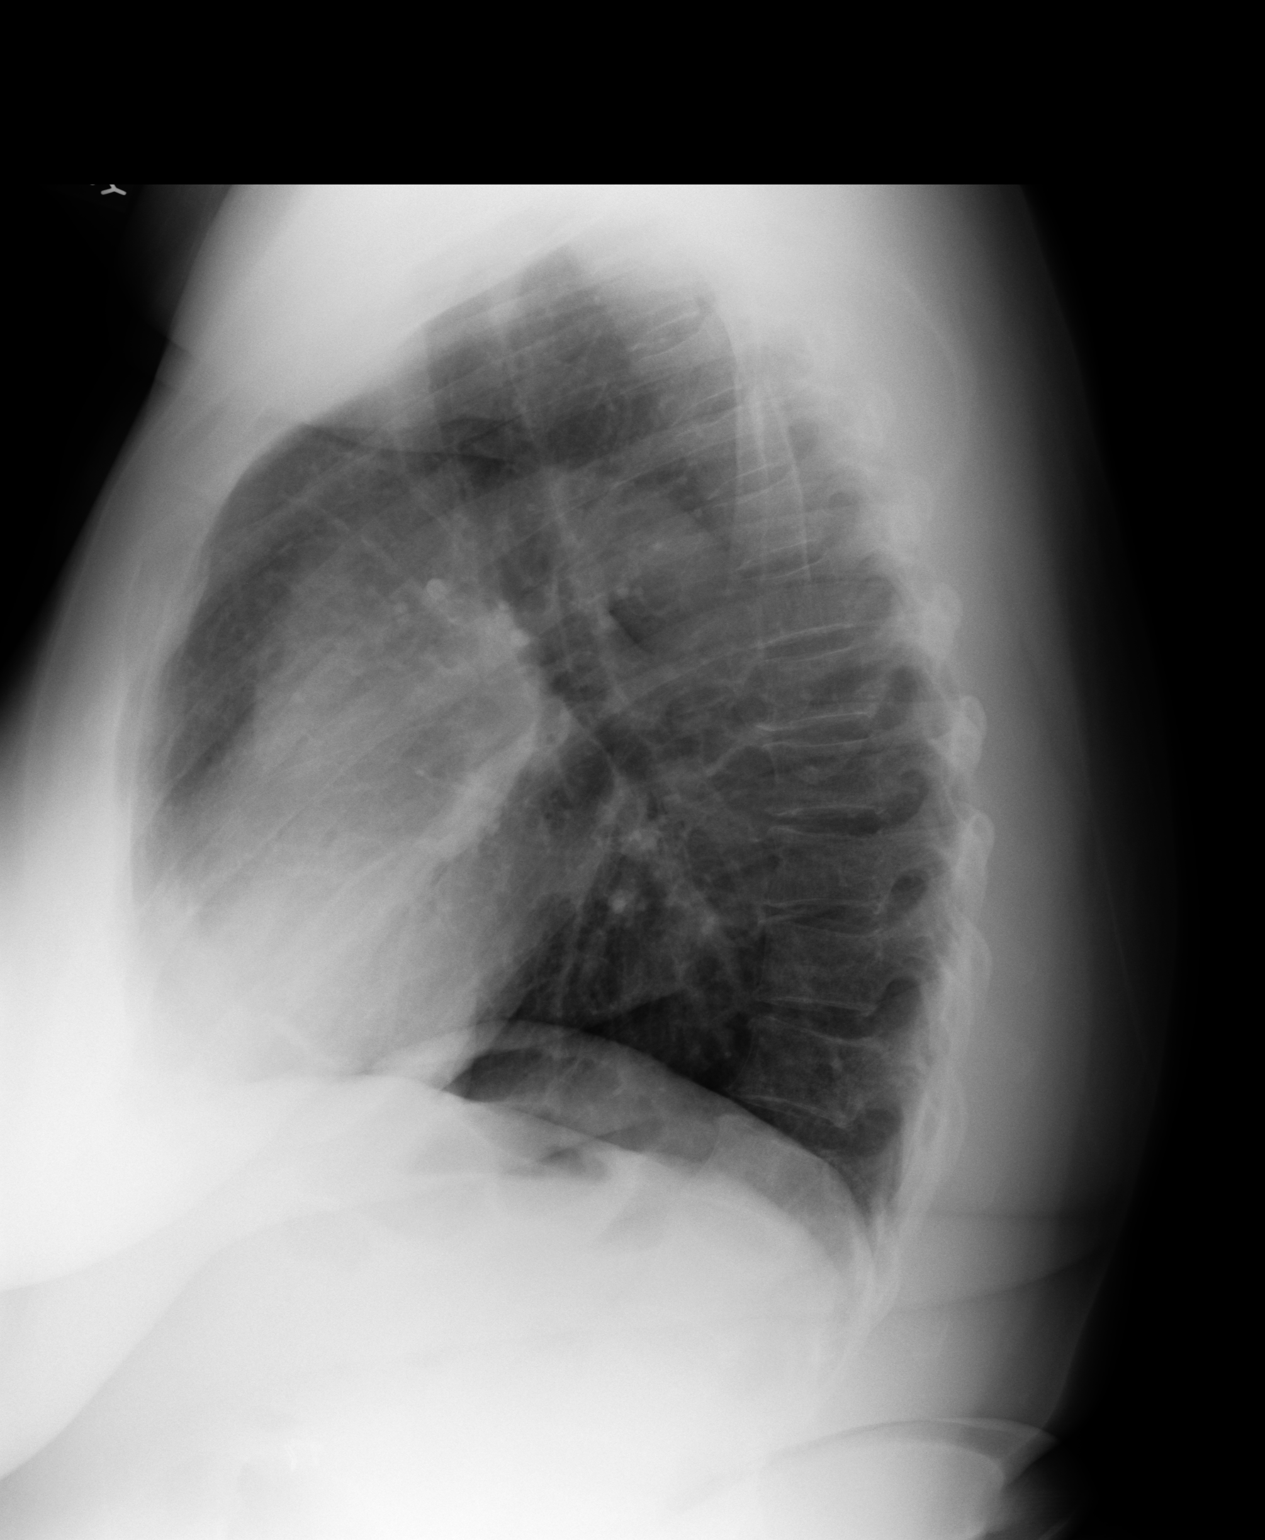

[2 of 2 positions shown; findings below may reference images not displayed]

PROCEDURE:     DXR - DXR CHEST PA (OR AP) AND LATERAL  - October 25, 2006  [DATE]

RESULT:     Comparison is made to the study dated 05/08/2006. There are old
healed fractures of the LEFT fifth, sixth and seventh ribs as noted
previously. The lungs are clear. The cardiac silhouette and pulmonary
vasculature are normal. The bony structures are otherwise unremarkable.
There is no failure. There is no infiltrate.
IMPRESSION: Stable chest x-ray. Old healed left rib fractures. No acute abnormality.

## 2008-11-22 ENCOUNTER — Emergency Department: Payer: Self-pay | Admitting: Emergency Medicine

## 2008-11-27 IMAGING — CT CT HEAD WITHOUT CONTRAST
2 series · 16 of 30 positions shown, 20 images · non-contrast
Comparison: none

REASON FOR EXAM: SEIZURE, H/O BRAIN TUMOR - RM 19
COMMENTS:

PROCEDURE:     CT  - CT HEAD WITHOUT CONTRAST  - December 18, 2006  [DATE]
RESULT:
TECHNIQUE: Axial images were obtained from the base of the skull to the
vertex without intravenous injection of contrast material.

[Series 2: without · axial · non-contrast · 0.39mm/px · z∈[-200,-70]mm · 13 of 32 slices shown, 17 images]
[im 3/32  brain]
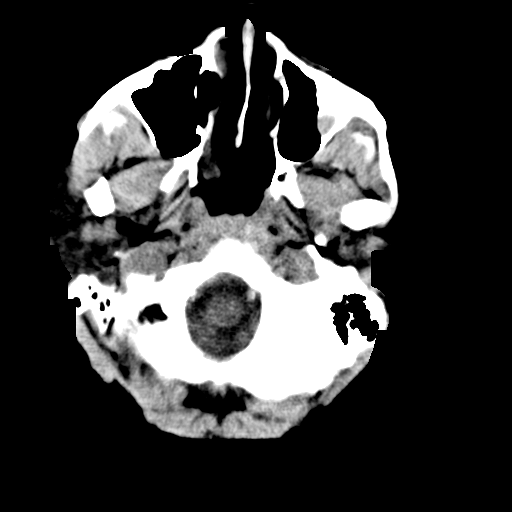
[im 3/32  bone]
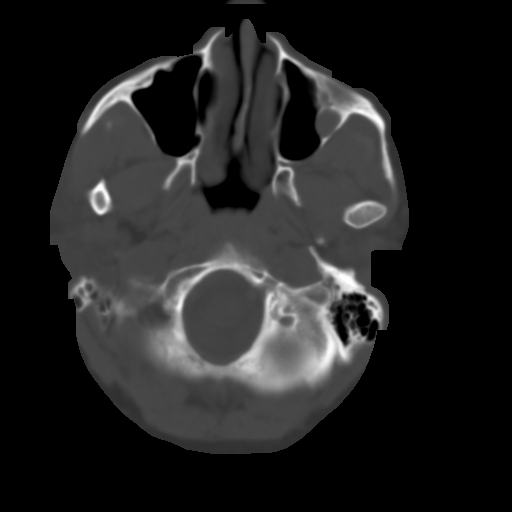
[im 5/32  brain]
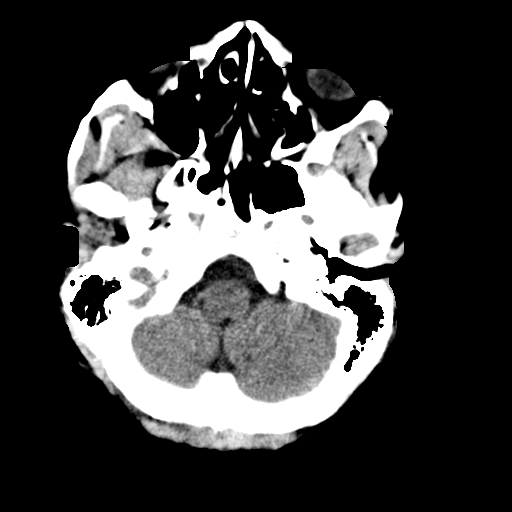
[im 7/32  brain]
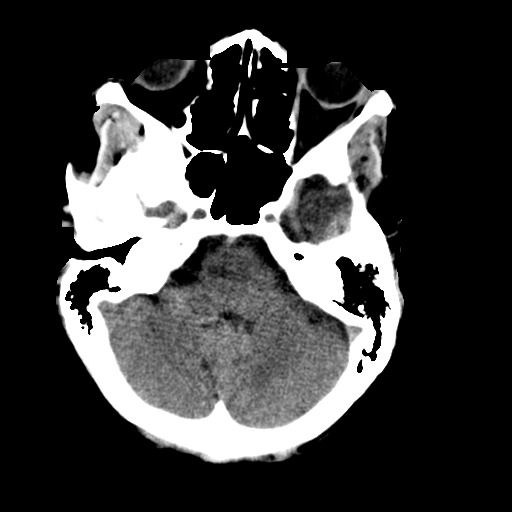
[im 9/32  brain]
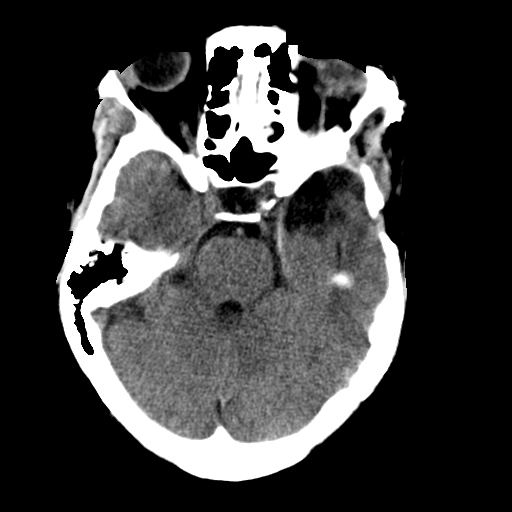
[im 12/32  brain]
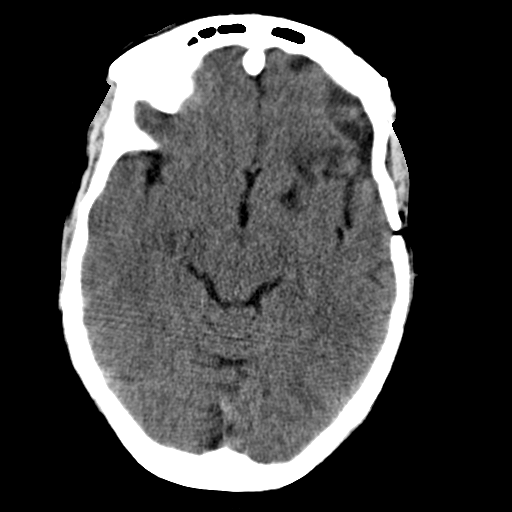
[im 12/32  bone]
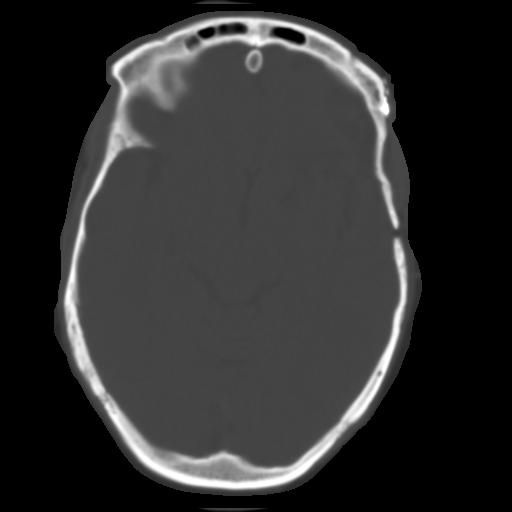
[im 14/32  brain]
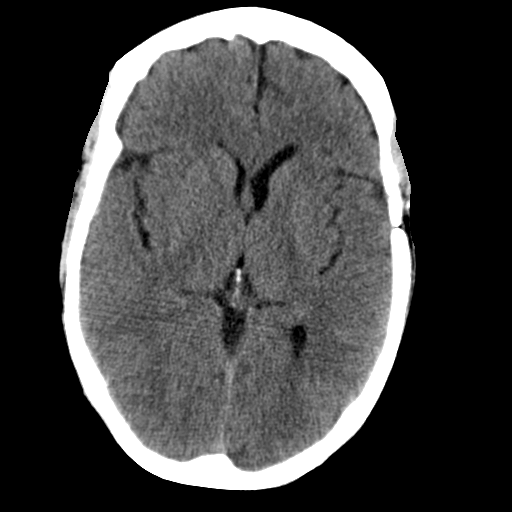
[im 16/32  brain]
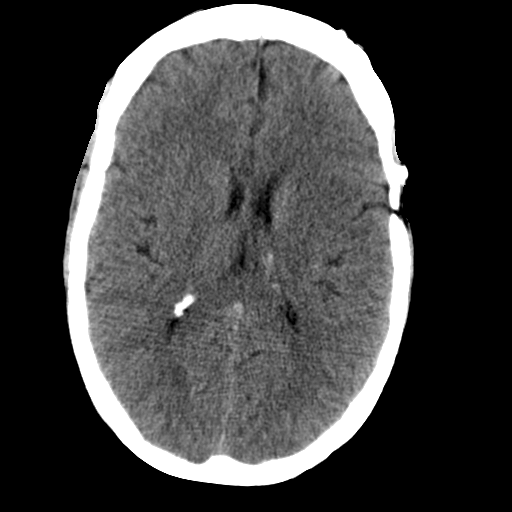
[im 18/32  brain]
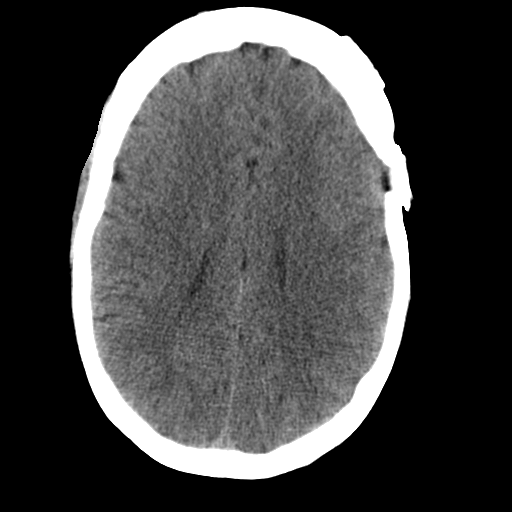
[im 20/32  brain]
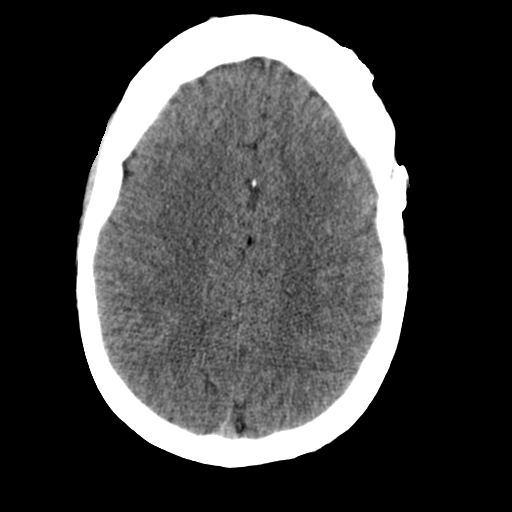
[im 20/32  bone]
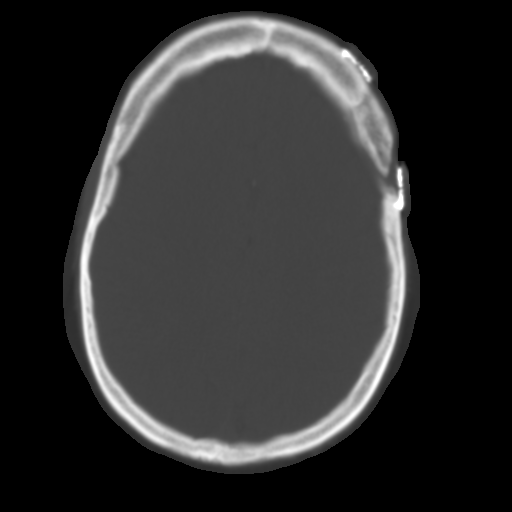
[im 23/32  brain]
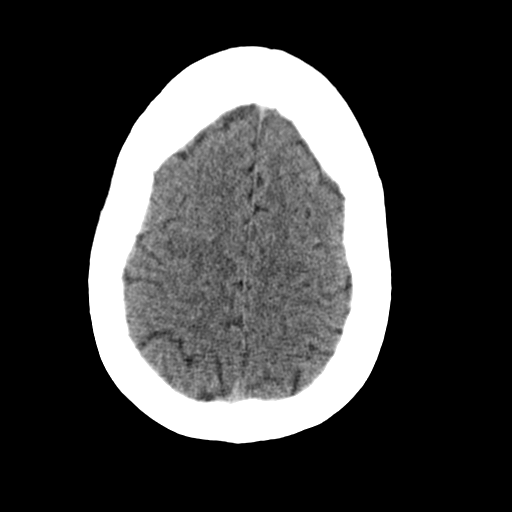
[im 25/32  brain]
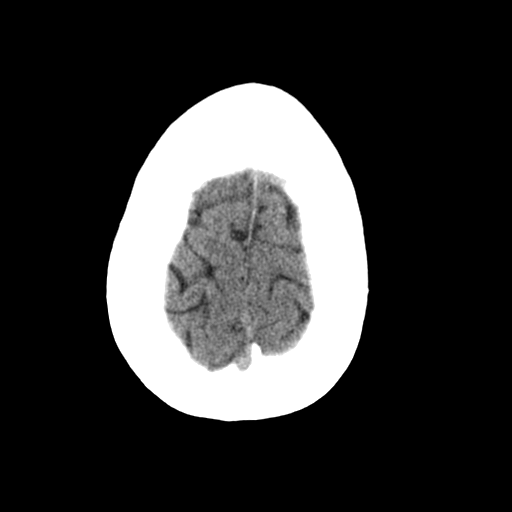
[im 27/32  brain]
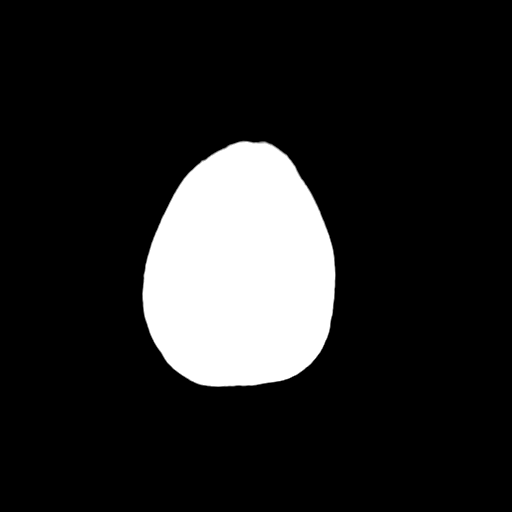
[im 29/32  brain]
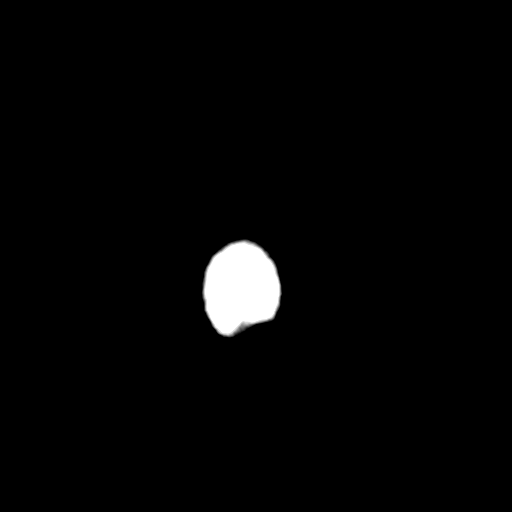
[im 29/32  bone]
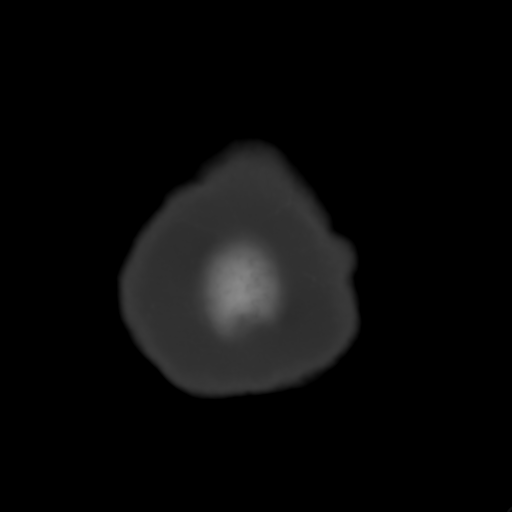

[Series 3: bone · axial · 0.39mm/px · z∈[-200,-155]mm · 3 of 32 slices shown]
[im 3/32  bone]
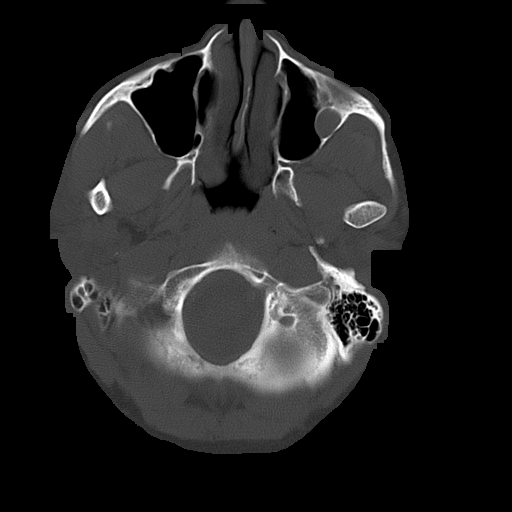
[im 7/32  bone]
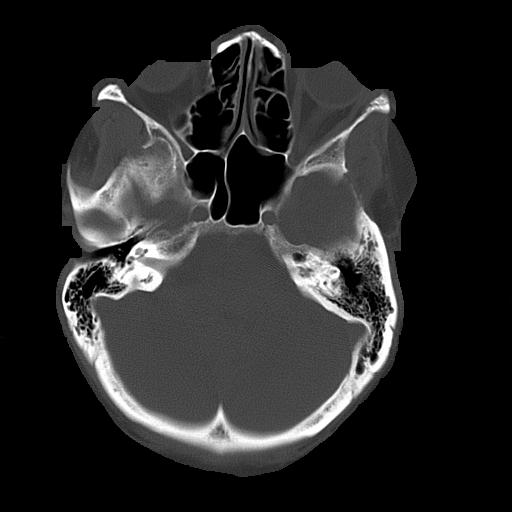
[im 12/32  bone]
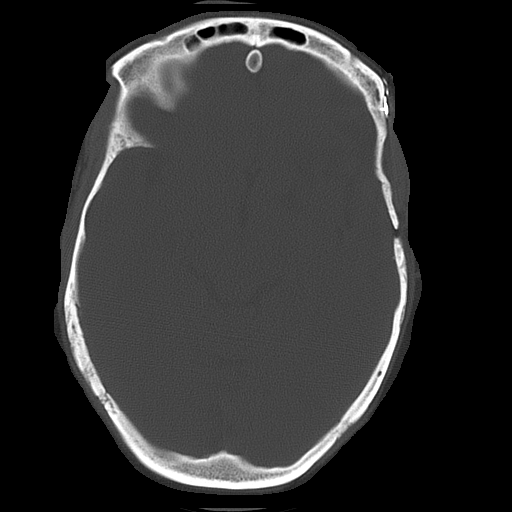

[16 of 30 positions shown; findings below may reference images not displayed]

FINDINGS: There is a density area in the LEFT frontotemporal lobe most
likely representing encephalomalacia from prior surgery. There is some mild
dilatation of the LEFT lateral ventricle. No acute intracerebral bleeds. No
extra-axial fluid collections. The sinuses appear clear as well as the
mastoids.
IMPRESSION: 1)Area of encephalomalacia in the LEFT frontotemporal region consistent with
the patient's prior surgery and appears similar to previous exams. No acute
intracranial processes are noted.

The report was faxed to the Emergency Room.

## 2009-06-18 ENCOUNTER — Emergency Department: Payer: Self-pay | Admitting: Internal Medicine

## 2009-07-08 HISTORY — PX: OTHER SURGICAL HISTORY: SHX169

## 2009-08-30 ENCOUNTER — Emergency Department: Payer: Self-pay | Admitting: Emergency Medicine

## 2009-09-14 ENCOUNTER — Emergency Department: Payer: Self-pay | Admitting: Emergency Medicine

## 2009-09-16 IMAGING — CR DG CHEST 2V
1 series · 2 of 2 positions shown · non-contrast
Comparison: none

REASON FOR EXAM: PERSISTENT PRODUCTIVE COUGH
COMMENTS:

[Series 1: view not recorded · 0.17mm/px · 2 of 2 slices shown]
[im 1/2]
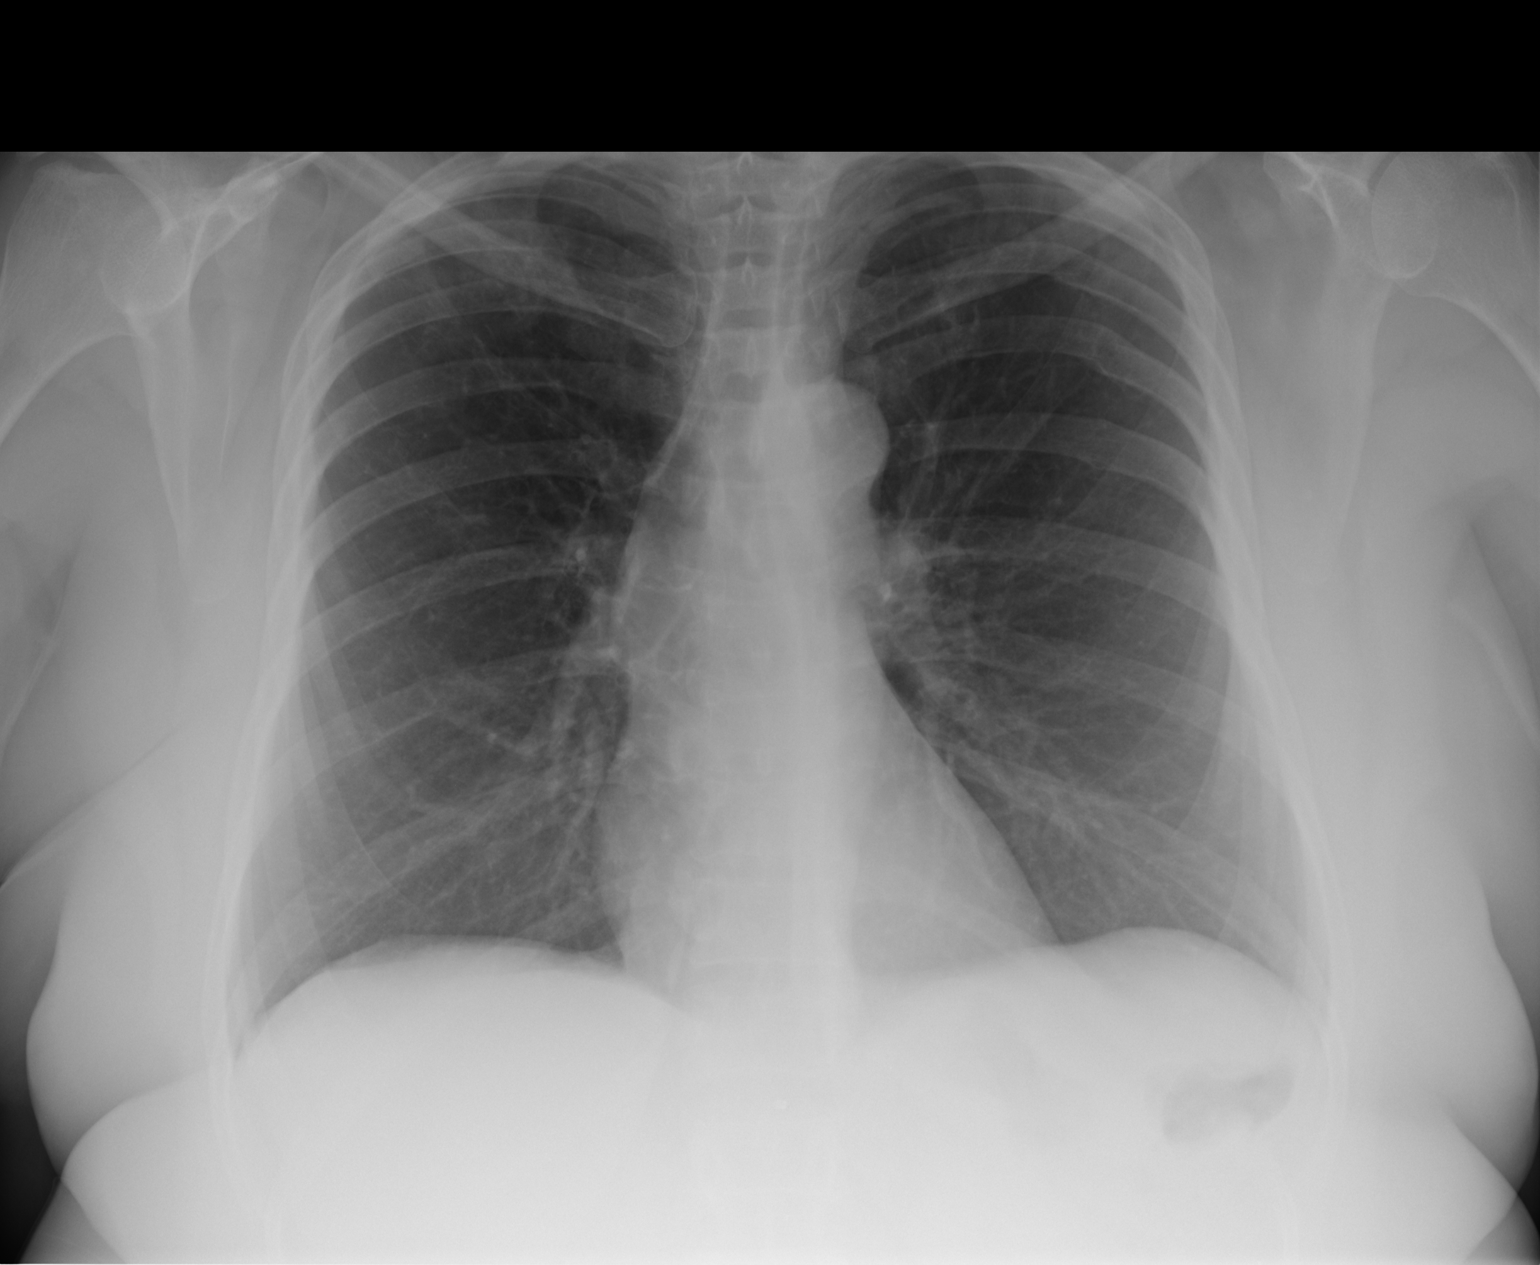
[im 2/2]
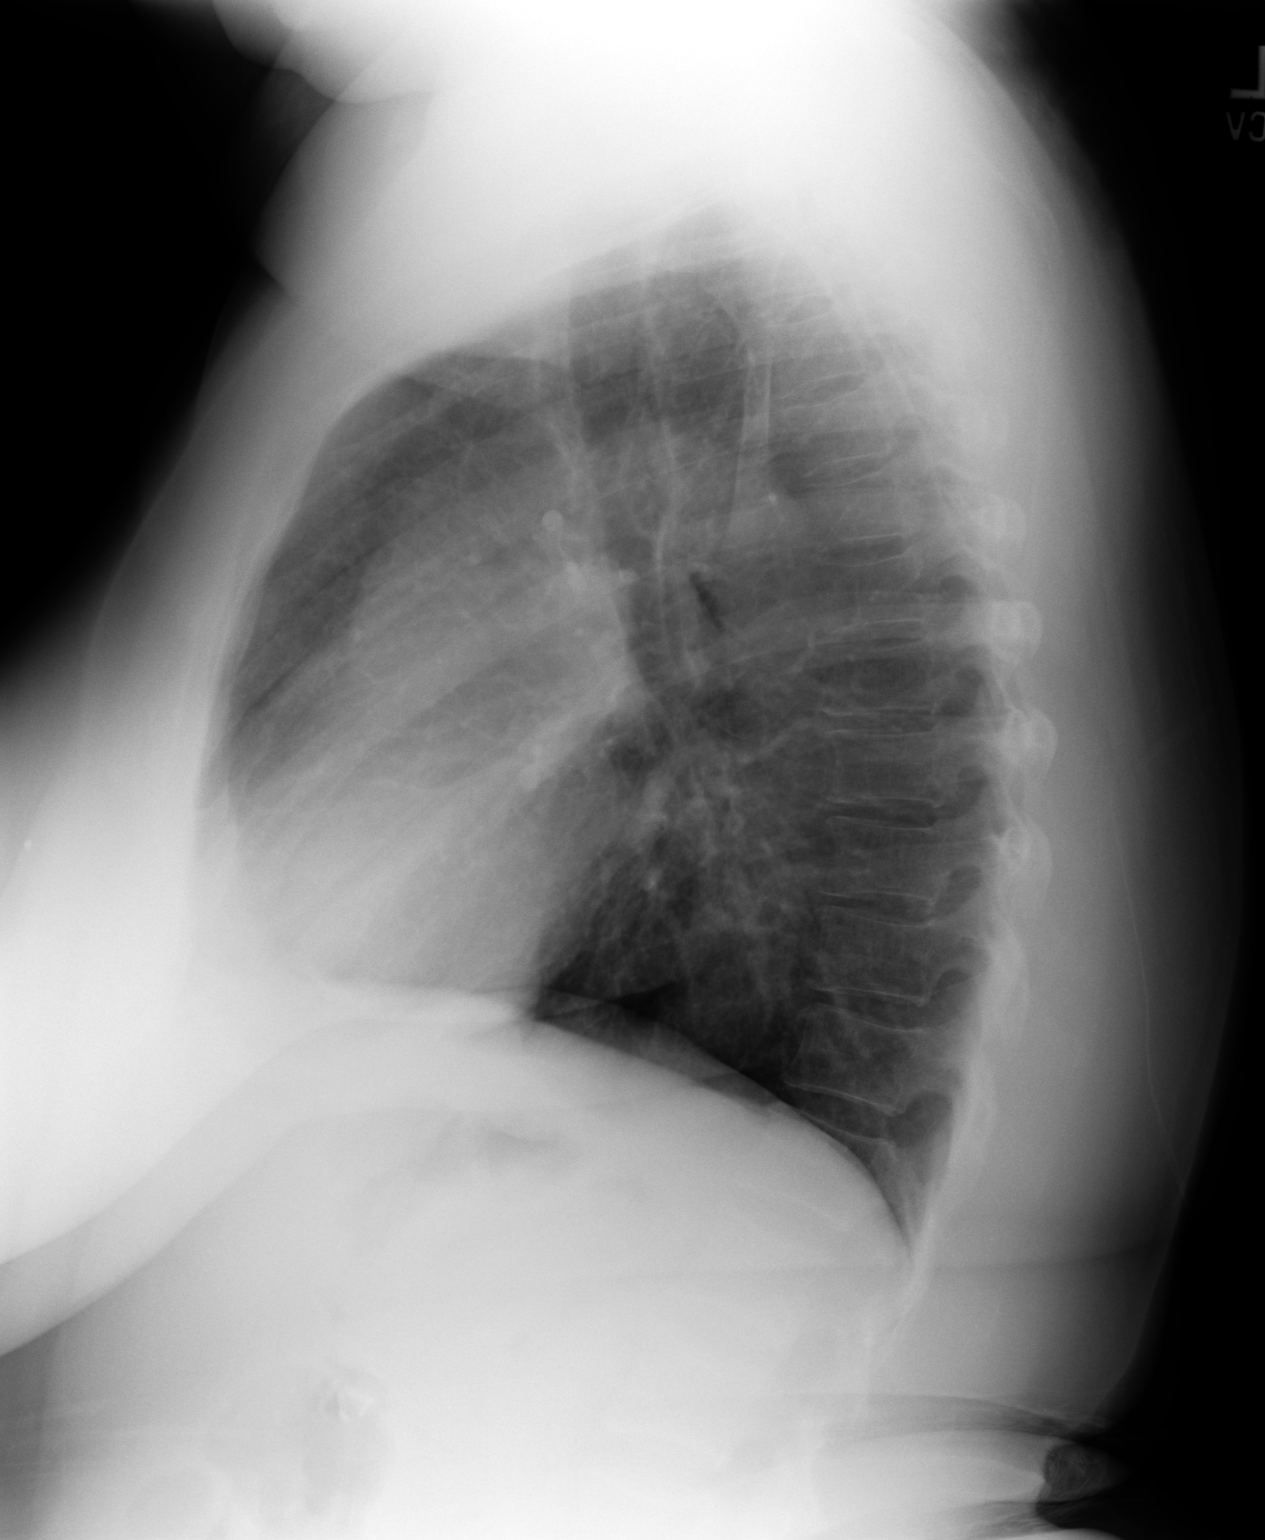

[2 of 2 positions shown; findings below may reference images not displayed]

PROCEDURE:     DXR - DXR CHEST PA (OR AP) AND LATERAL  - October 07, 2007 [DATE]

RESULT:     Comparison is made to the prior exam of 10/25/2006. The lung
fields are clear. No pneumonia, pneumothorax or pleural effusion is seen.
Heart size is normal. There is an old rib fracture deformity of the 6th LEFT
posterior rib that now shows advanced healing.
IMPRESSION: 1.     No acute changes are identified.

## 2009-10-09 IMAGING — RF DG UGI W/O KUB
1 series · 14 of 24 positions shown · non-contrast
Comparison: none

REASON FOR EXAM: cough
COMMENTS:

[Series 1: run · 24 acquisitions, 14 frames shown]
[im 1/24]
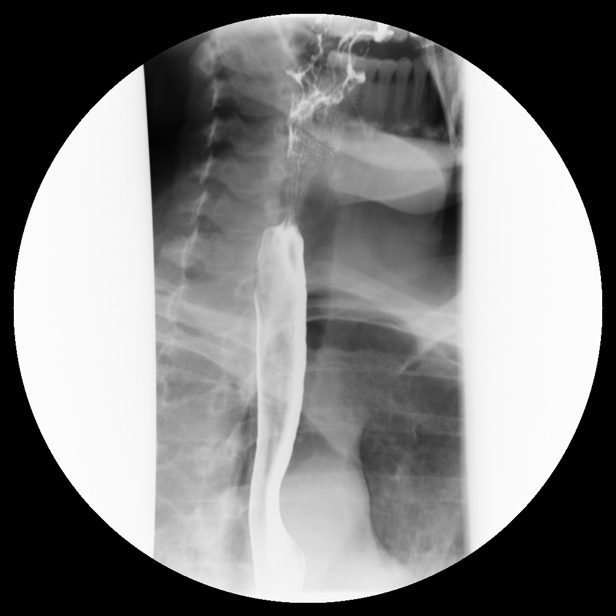
[im 2/24]
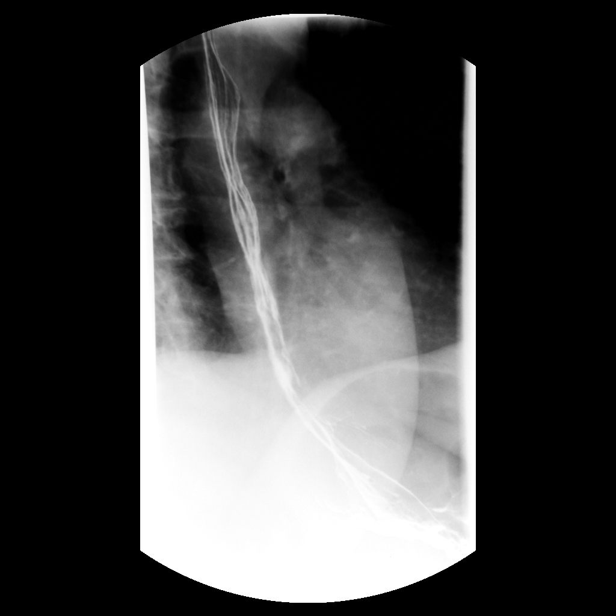
[im 3/24]
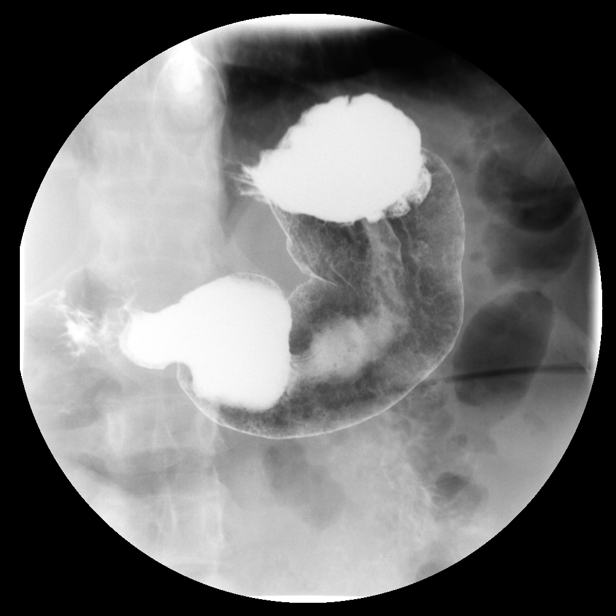
[im 7/24]
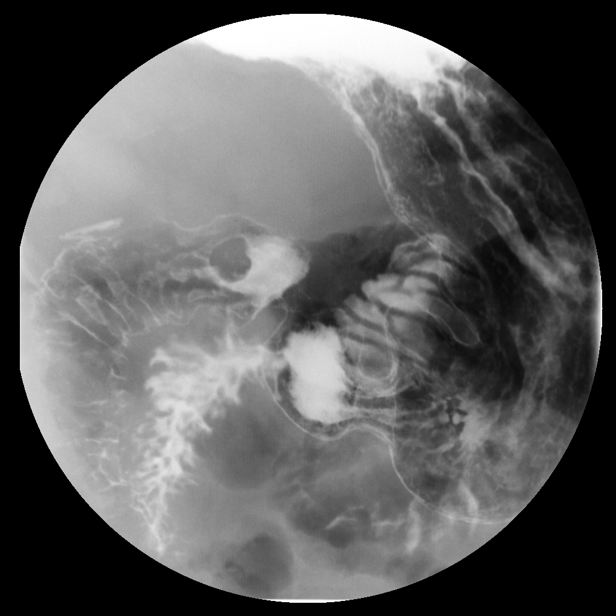
[im 9/24]
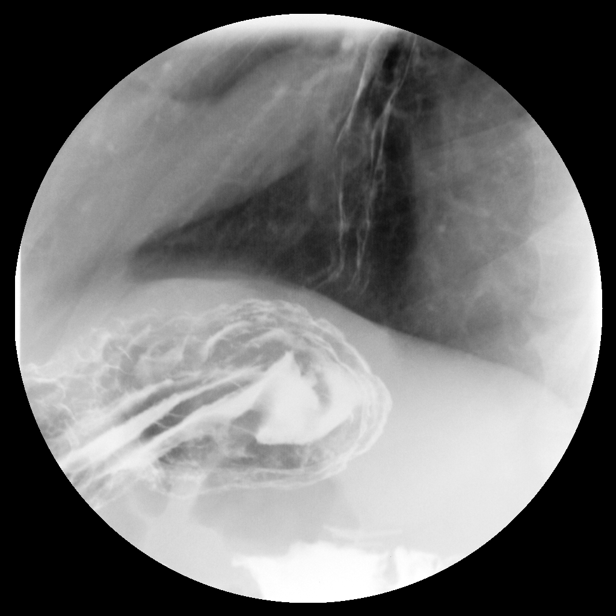
[im 12/24]
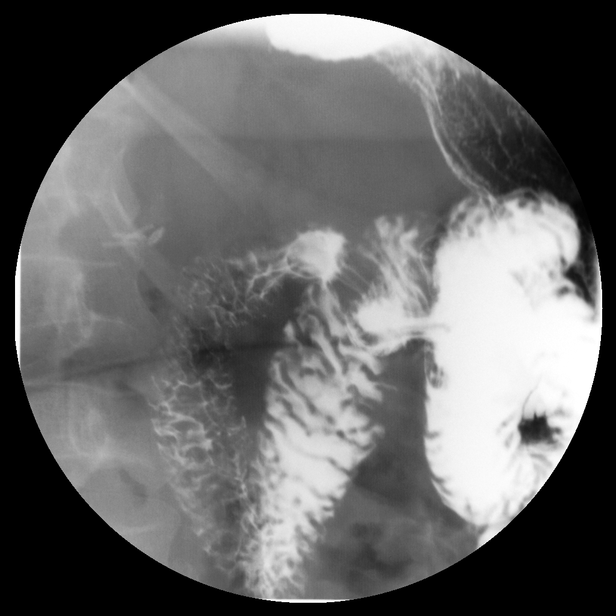
[im 14/24]
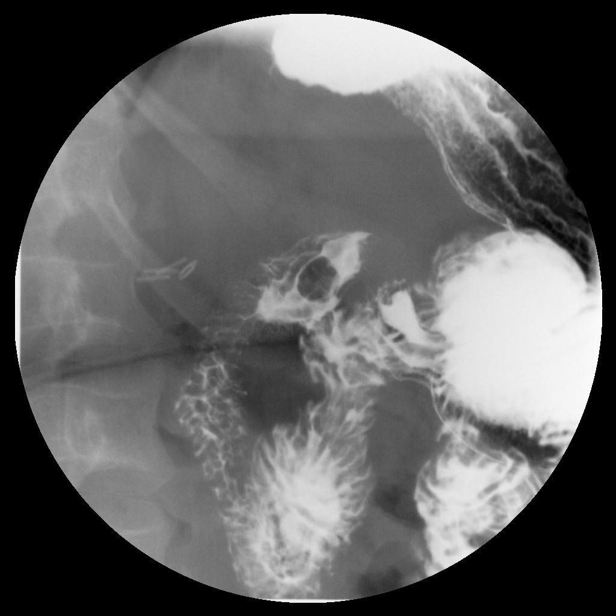
[im 15/24]
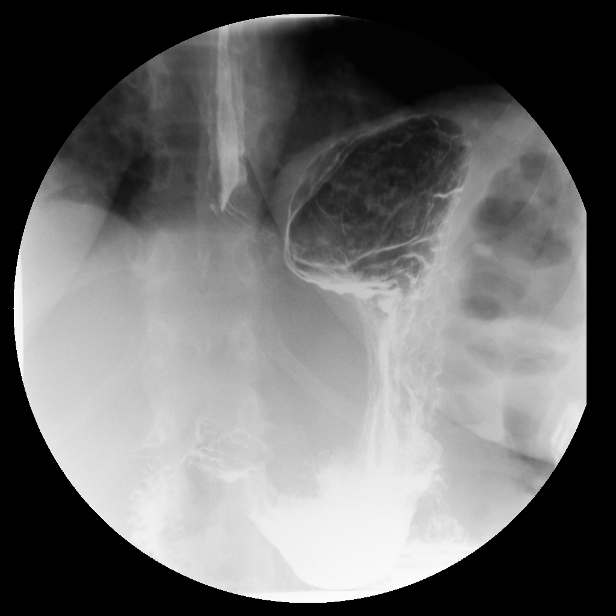
[im 18/24]
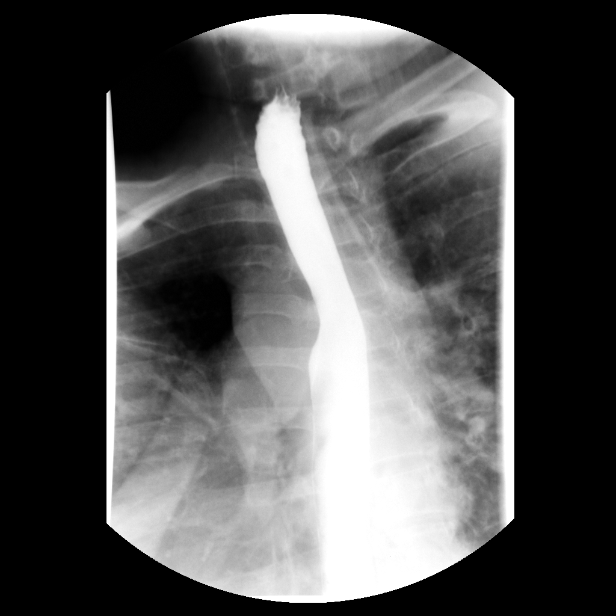
[im 19/24]
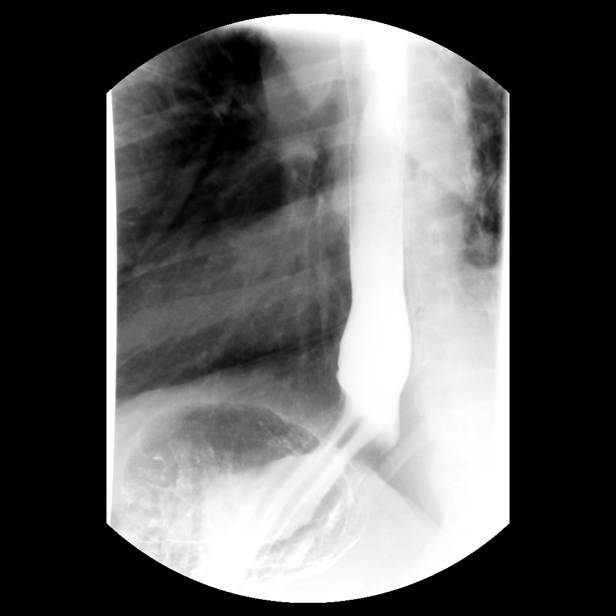
[im 21/24]
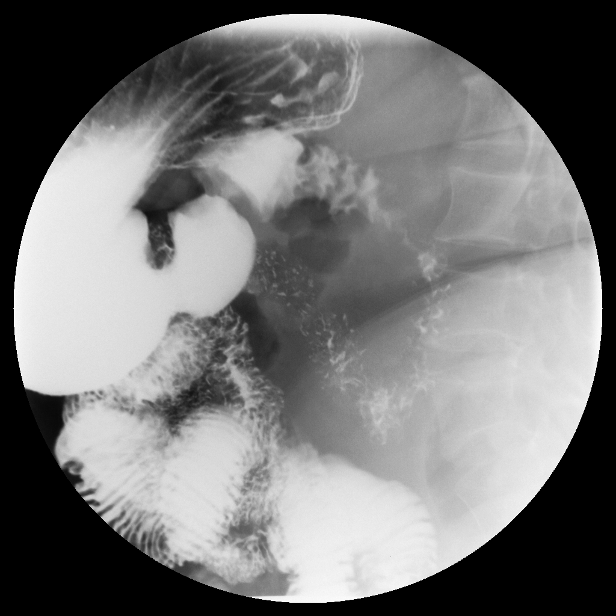
[im 22/24]
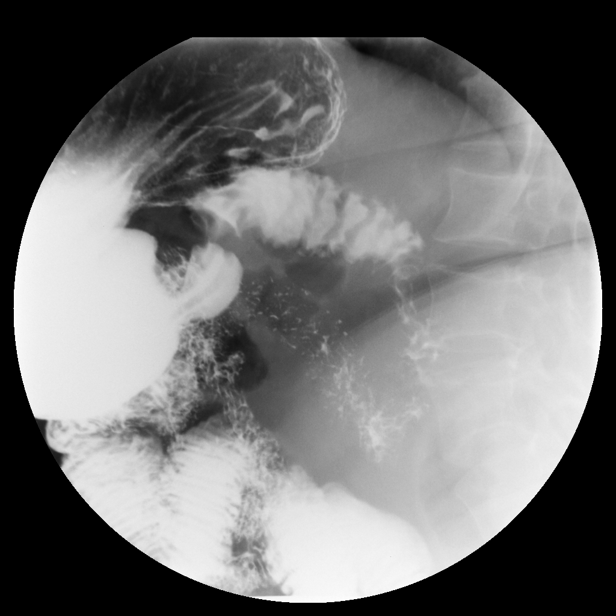
[im 23/24]
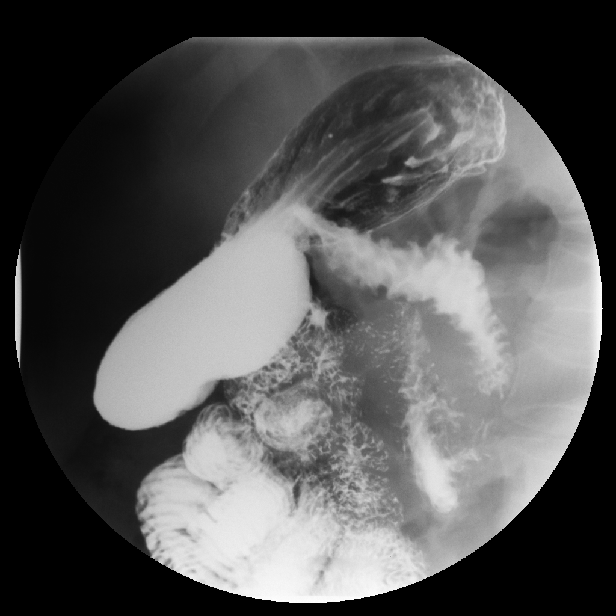
[im 24/24]
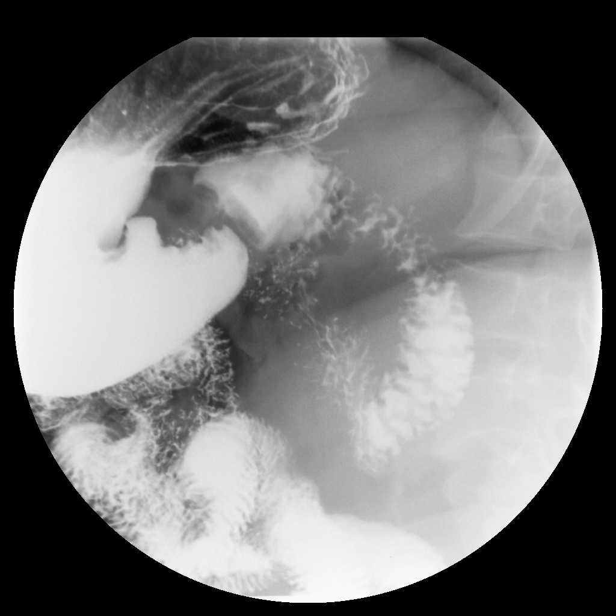

[14 of 24 positions shown; findings below may reference images not displayed]

PROCEDURE:     FL  - FL UPPER GI  - October 30, 2007 [DATE]

RESULT:     Comparison: No available comparison exam.

Procedure:

Following the ingestion of gas-forming crystals, the patient ingested thick
barium in the upright position. Multiple fluoroscopic spot images of the
esophagus were obtained. The patient was placed in a prone position, and
turned from right lateral decubitus through the supine position to left
lateral decubitus. Multiple fluoroscopic spot images of the stomach and
duodenum were obtained. Following this, the patient was placed in a prone
right side down oblique position and thin barium was ingested while multiple
fluoroscopic spot images of the esophagus were obtained.
FINDINGS: The swallowing function is grossly normal. There is normal esophageal
motility. There is a small hiatal hernia. There is a persistent
approximately 1.5 cm circumscribed filling defect involving the first
portion of the duodenum. There is no evidence of stricture, diverticula,
gastroesophageal reflux, or gross ulceration.
IMPRESSION: 1. There is a persistent approximately 1.5 cm circumscribed filling defect
involving the first portion of the duodenum. This can represent a duodenal
neoplasm. Recommend further evaluation with endoscopy.

2. There is a small hiatal hernia.

## 2009-11-15 ENCOUNTER — Emergency Department: Payer: Self-pay

## 2009-12-21 ENCOUNTER — Inpatient Hospital Stay: Payer: Self-pay | Admitting: Internal Medicine

## 2010-01-01 ENCOUNTER — Emergency Department: Payer: Self-pay | Admitting: Emergency Medicine

## 2010-02-04 IMAGING — CT CT CHEST W/ CM
2 series · 16 of 32 positions shown, 19 images · non-contrast
Comparison: none

REASON FOR EXAM: Shortness of breath
COMMENTS:

[Series 2: soft tissue · axial · 0.71mm/px · z∈[-88,+77]mm · 6 of 62 slices shown]
[im 8/62  mediastinal]
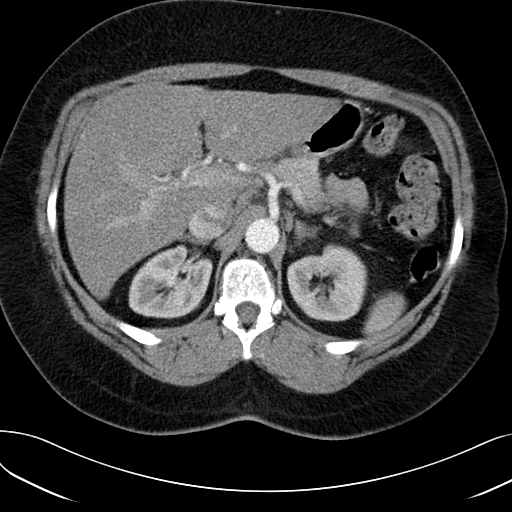
[im 16/62  mediastinal]
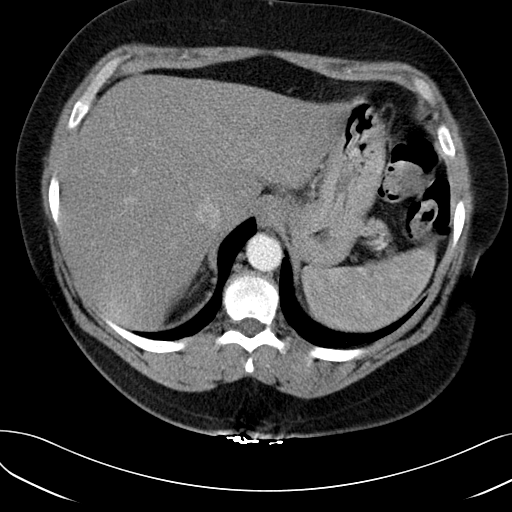
[im 21/62  mediastinal]
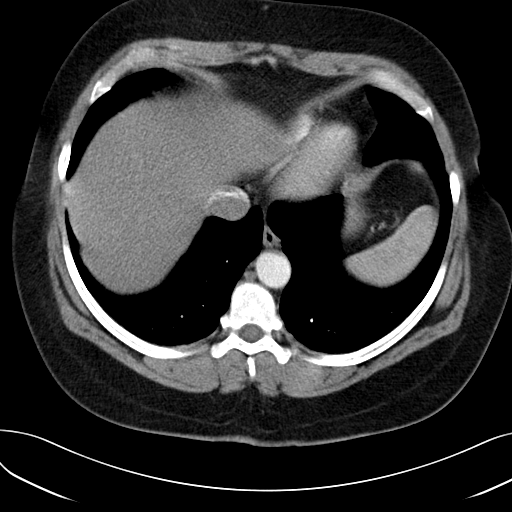
[im 27/62  mediastinal]
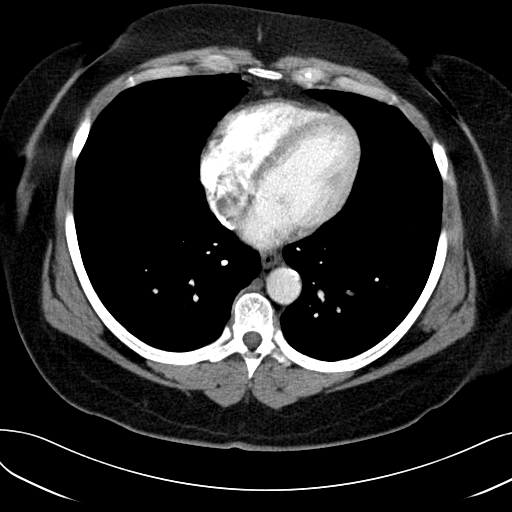
[im 35/62  mediastinal]
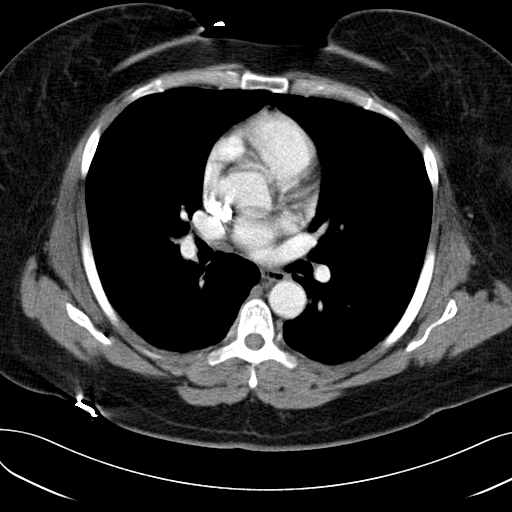
[im 41/62  mediastinal]
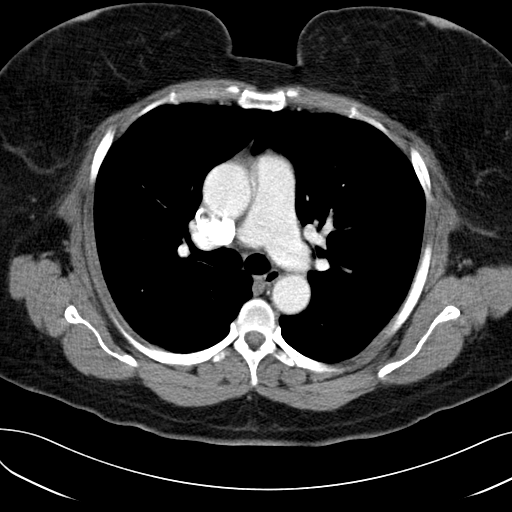

[Series 6: high res · axial · 0.71mm/px · z∈[-99,+157]mm · 10 of 41 slices shown, 13 images]
[im 5/41  mediastinal]
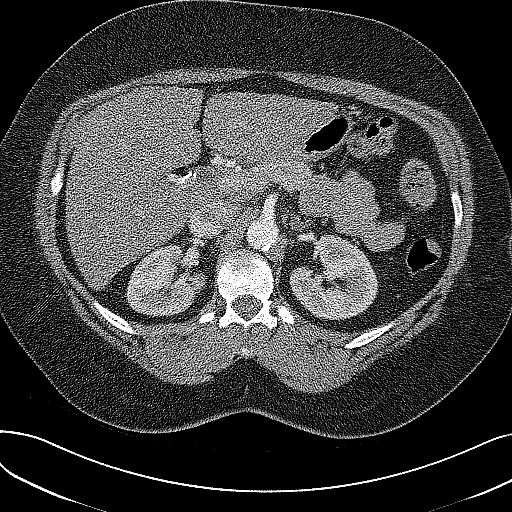
[im 5/41  lung]
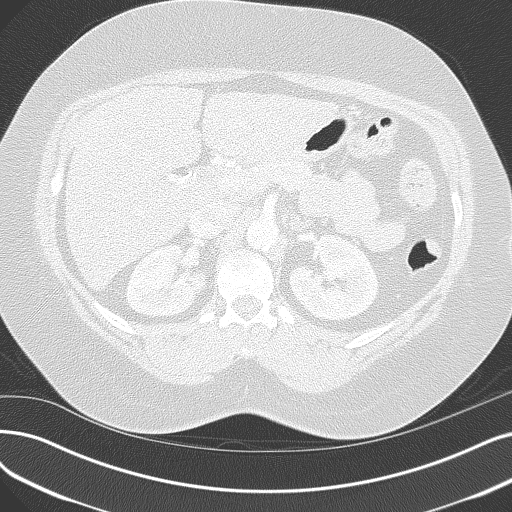
[im 9/41  lung]
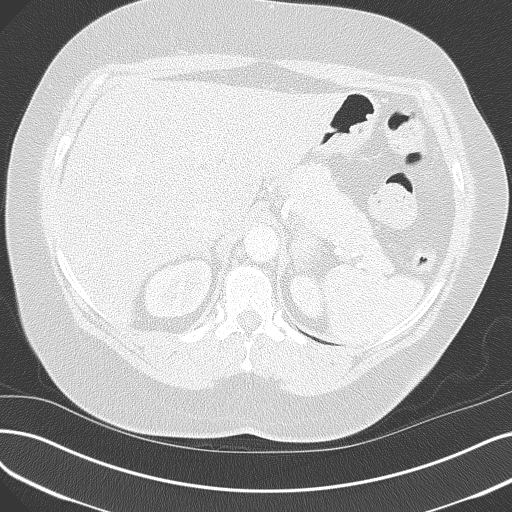
[im 13/41  lung]
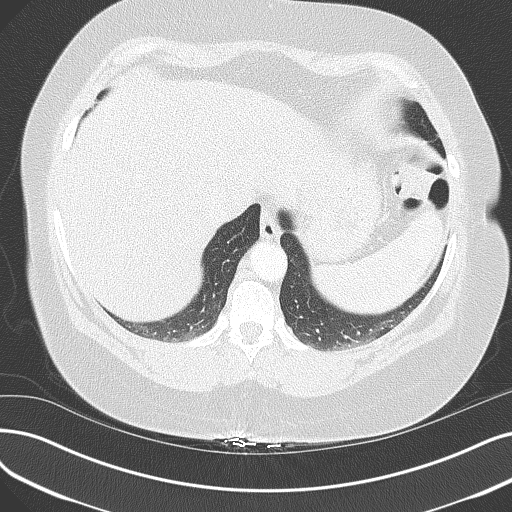
[im 17/41  lung]
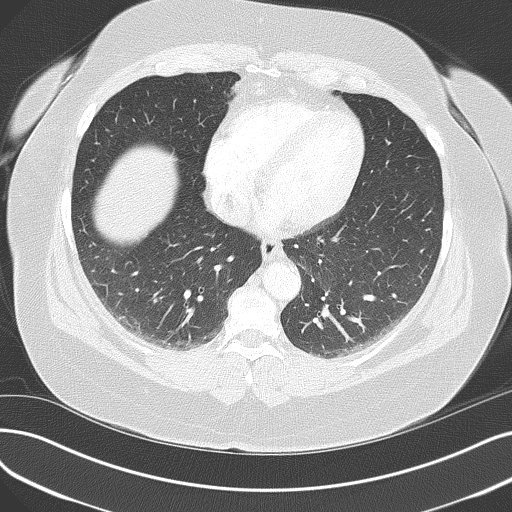
[im 20/41  mediastinal]
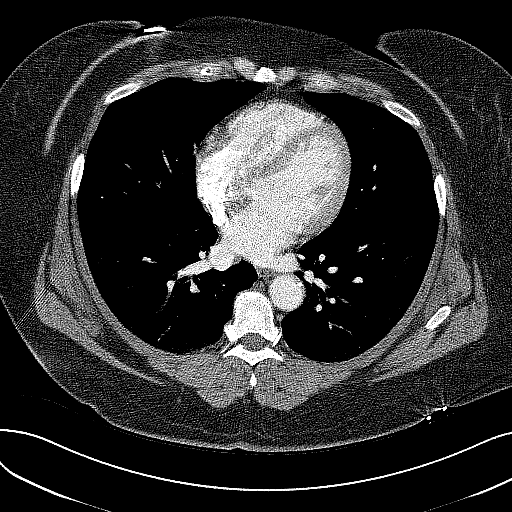
[im 20/41  lung]
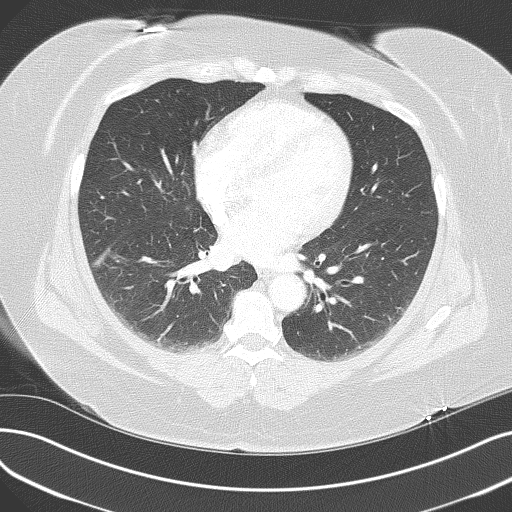
[im 21/41  lung]
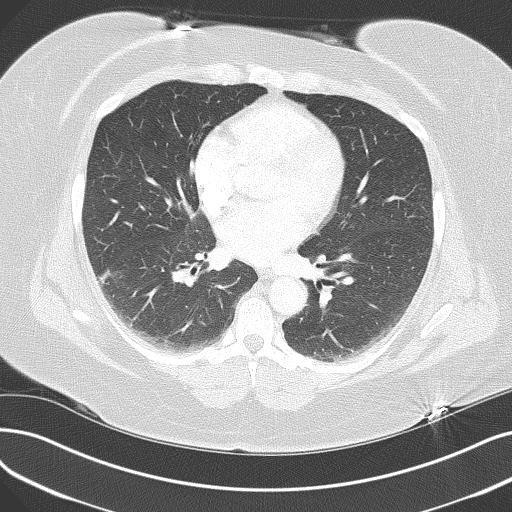
[im 25/41  lung]
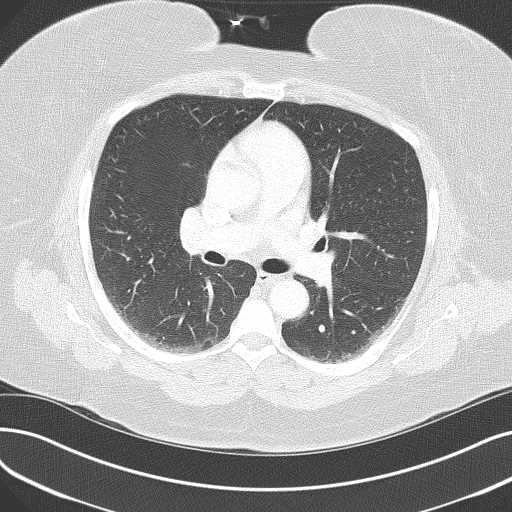
[im 29/41  lung]
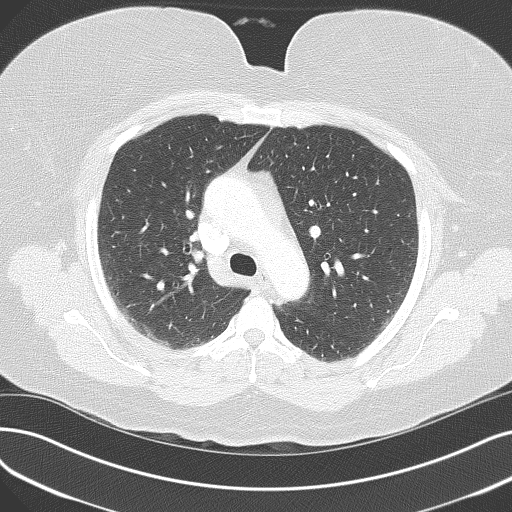
[im 33/41  mediastinal]
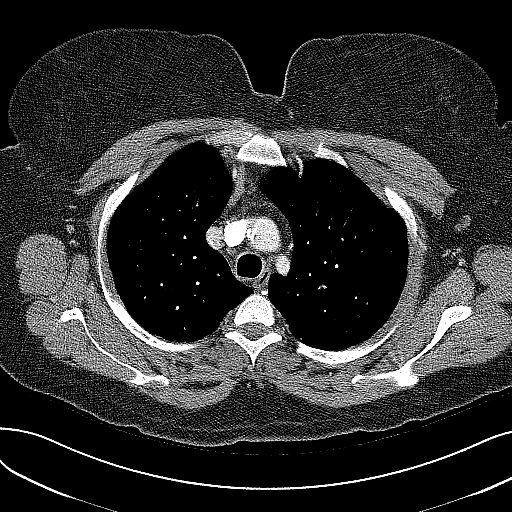
[im 33/41  lung]
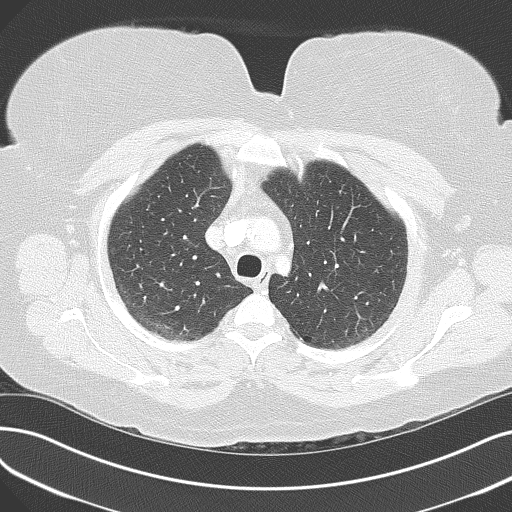
[im 37/41  lung]
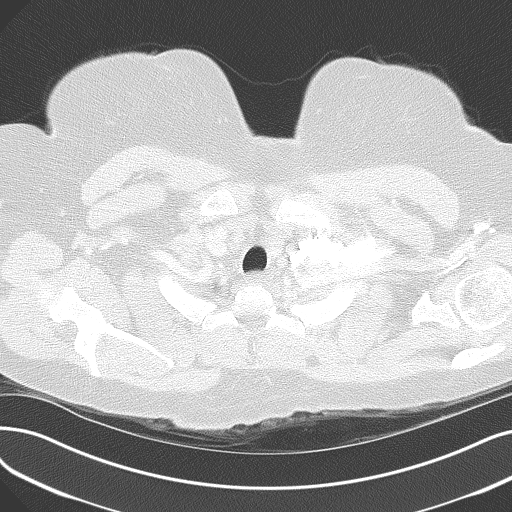

[16 of 32 positions shown; findings below may reference images not displayed]

PROCEDURE:     CT  - CT CHEST WITH CONTRAST  - February 25, 2008  [DATE]

RESULT:     CT of the chest is performed utilizing an intravenous injection
of 75 ml of Lsovue-H63 iodinated intravenous contrast.

Comparison is made to images through the chest from a previous abdominal CT
performed on 03/21/2006.
FINDINGS: There is dependent atelectasis present bilaterally. There is some
minimal linear fibrosis or atelectasis in the lingula. There is no discrete
pulmonary parenchymal nodular mass. There is no pulmonary edema or definite
infiltrate. No central endobronchial lesion is apparent. No pulmonary
parenchymal widespread nodular density or septal thickening is evident.
There is no significant emphysematous lung disease demonstrated. Images
obtained through the upper abdomen during the exam show no gross abnormality
of the included portions of the liver or spleen. There is nodular density
involving the LEFT adrenal gland anterior to the upper pole of the LEFT
kidney measuring approximately 1.6 cm. This is stable since the previous
exam performed in 1007. Cholecystectomy clips are noted. The mediastinum and
hilar regions show no mass or adenopathy. There is no pleural or pericardial
effusion.
IMPRESSION: 1.  Stable, LEFT adrenal nodule.
2.  Minimal dependent atelectasis and lingular fibrosis. No significant
underlying cardiopulmonary disease evident. There is no adenopathy or
pulmonary parenchymal abnormality otherwise.

## 2010-03-04 IMAGING — US ABDOMEN ULTRASOUND
1 series · 17 of 25 positions shown · non-contrast
Comparison: none

REASON FOR EXAM: Alcohol cirrhosis
COMMENTS:

[Series 1: abdomen ultrasound · 17 of 58 slices shown]
[im 1/58]
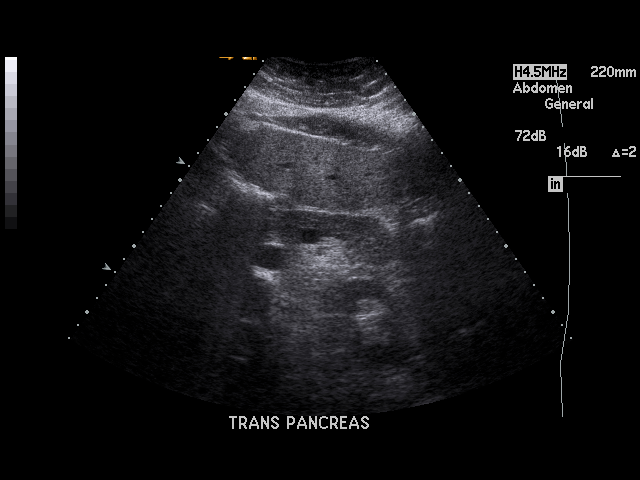
[im 5/58]
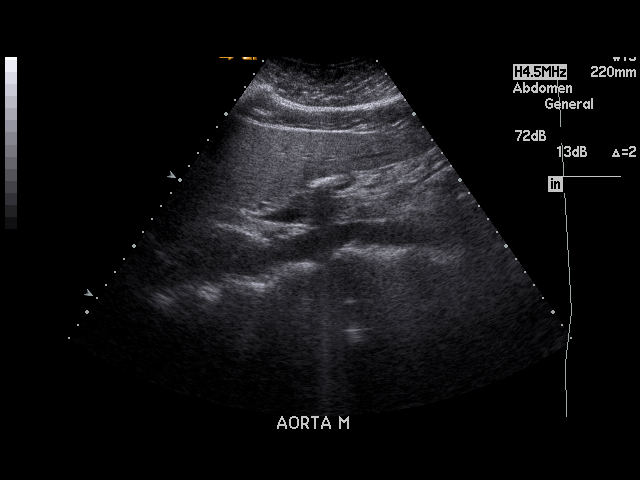
[im 8/58]
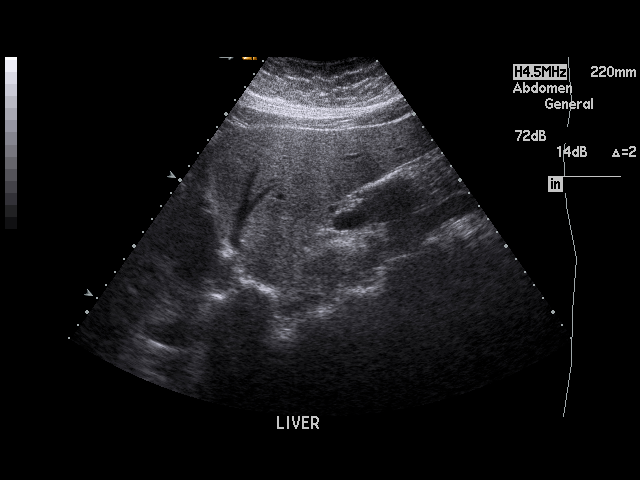
[im 12/58]
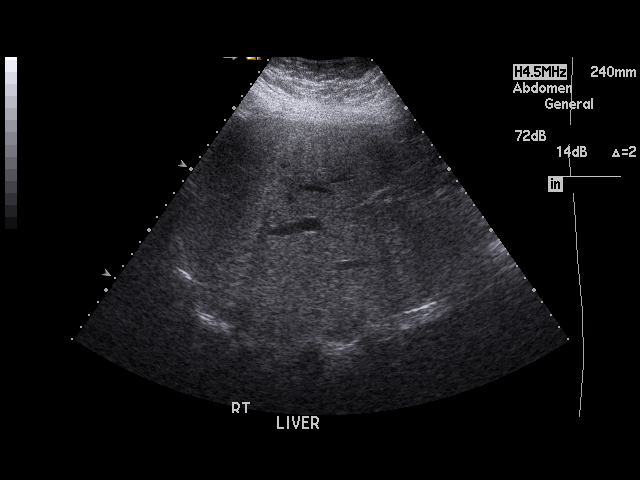
[im 15/58]
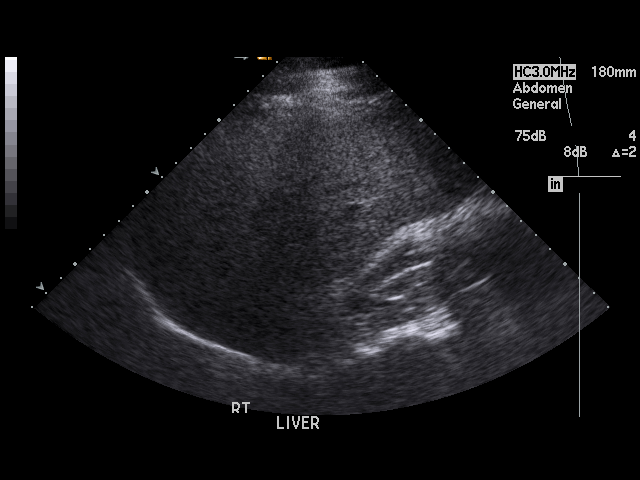
[im 20/58]
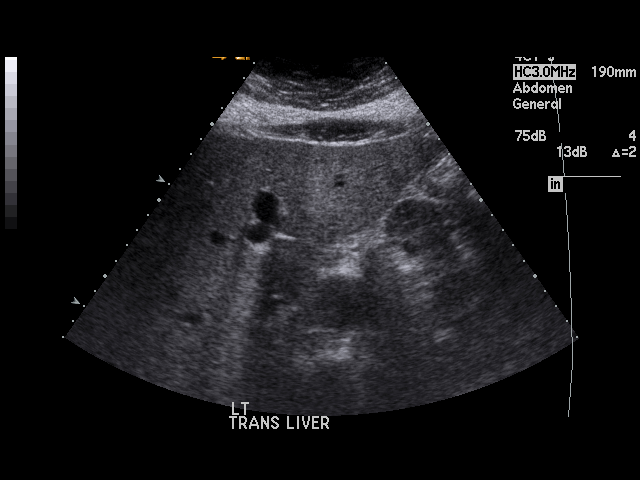
[im 22/58]
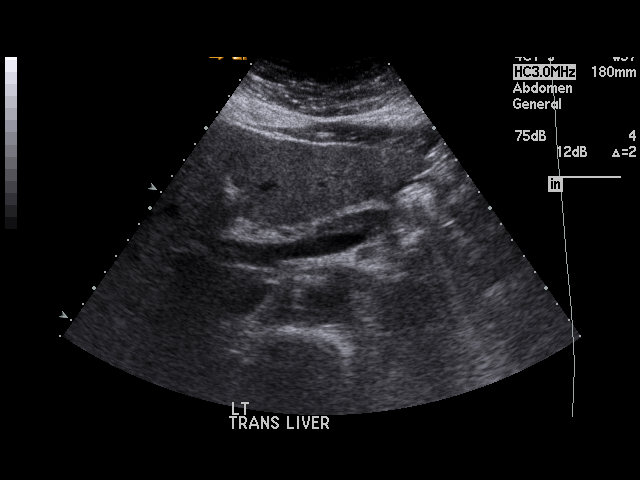
[im 27/58]
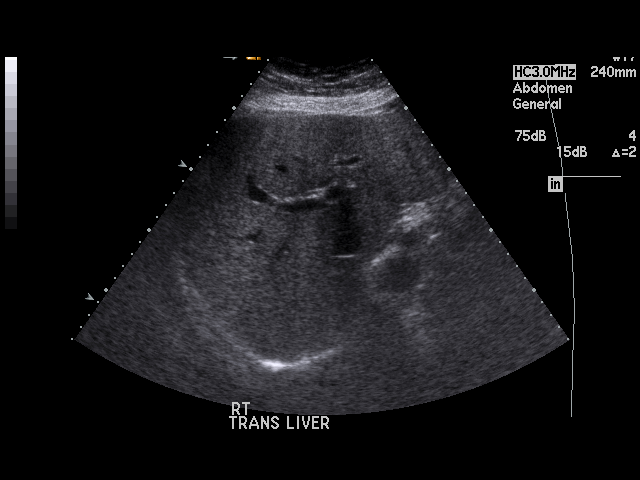
[im 29/58]
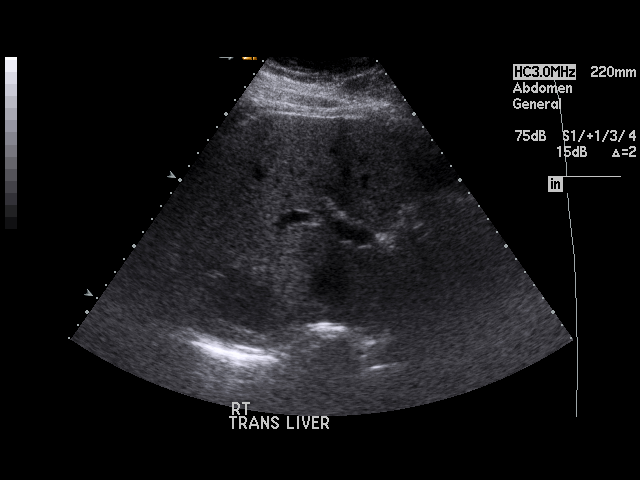
[im 31/58]
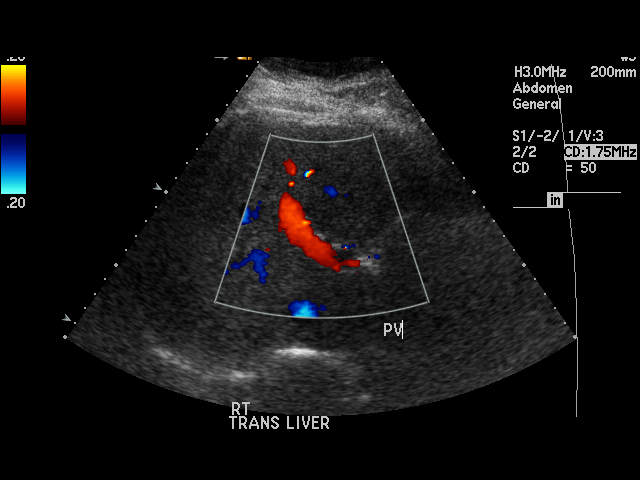
[im 36/58]
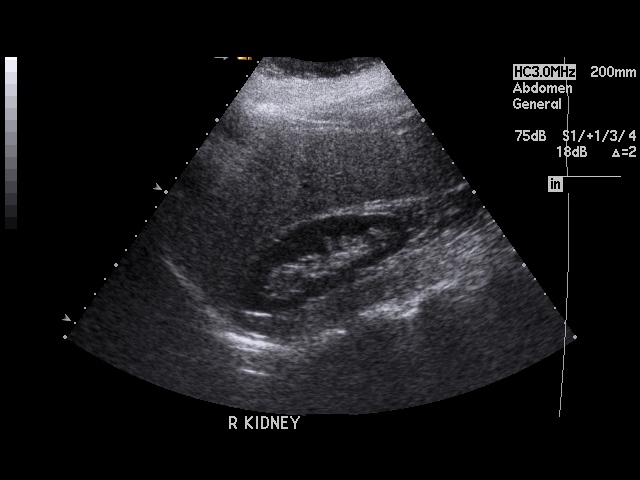
[im 39/58]
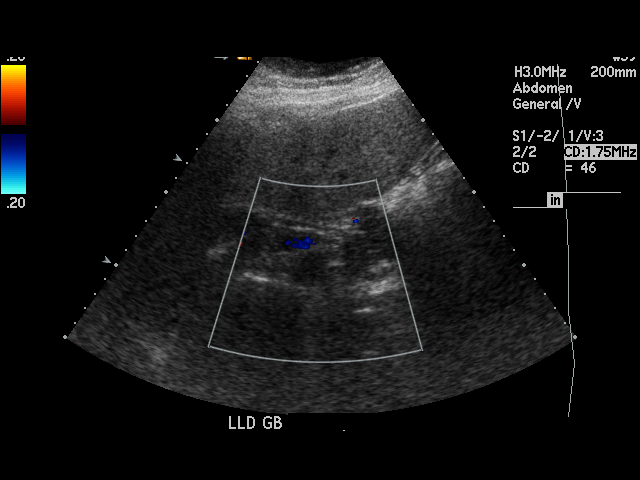
[im 43/58]
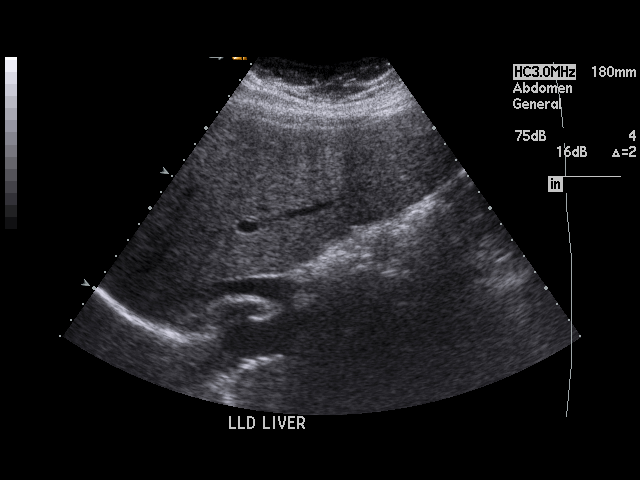
[im 46/58]
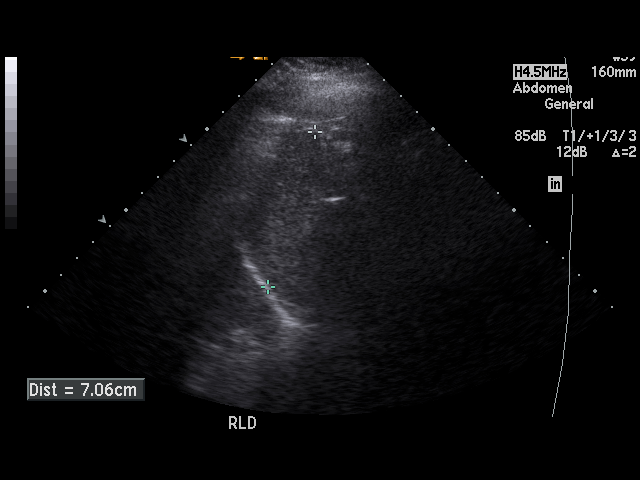
[im 50/58]
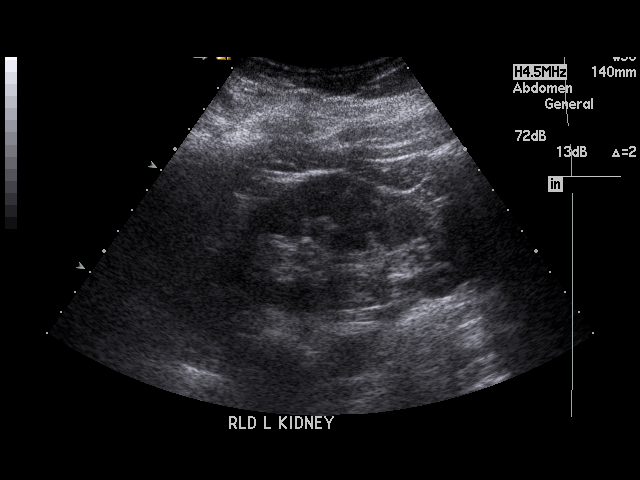
[im 53/58]
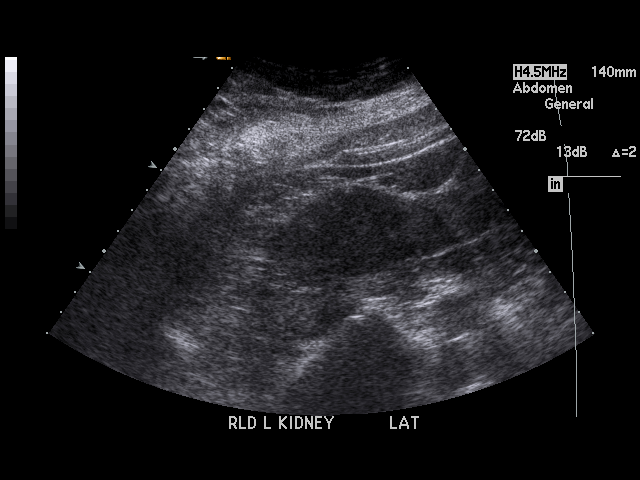
[im 58/58]
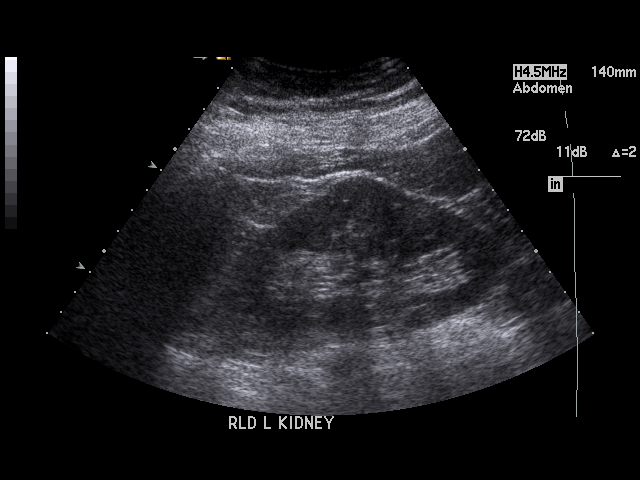

[17 of 25 positions shown; findings below may reference images not displayed]

PROCEDURE:     US  - US ABDOMEN GENERAL SURVEY  - March 24, 2008  [DATE]

RESULT:     The hepatic echo pattern shows no specific abnormalities.
Hepatopetal portal flow is seen. The spleen size is normal. A portion of the
pancreas is obscured by bowel gas but the major portion of the pancreas is
visualized and is normal in appearance. The abdominal aorta and inferior
vena cava are partially obscured. The patient is status post
cholecystectomy. The common bile duct measures 3.6 mm in diameter which is
within normal limits. The kidneys show no hydronephrosis. There is no
ascites.
IMPRESSION: 1.  The patient is status post cholecystectomy.
2.  No acute changes are identified.

## 2010-04-23 ENCOUNTER — Emergency Department: Payer: Self-pay | Admitting: Emergency Medicine

## 2010-05-15 ENCOUNTER — Emergency Department: Payer: Self-pay | Admitting: Emergency Medicine

## 2010-05-31 IMAGING — CR DG CHEST 2V
1 series · 2 of 2 positions shown · non-contrast
Comparison: none

REASON FOR EXAM: COUGH
COMMENTS:

PROCEDURE:     DXR - DXR CHEST PA (OR AP) AND LATERAL  - June 20, 2008 [DATE]
RESULT:     Comparison is made to a study 07 October, 2007.
The lungs are well expanded. There is no focal infiltrate. The heart is
normal in size. The pulmonary vascularity is not engorged. I see no pleural
effusion.

[Series 1: view not recorded · 0.17mm/px · 2 of 2 slices shown]
[im 1/2]
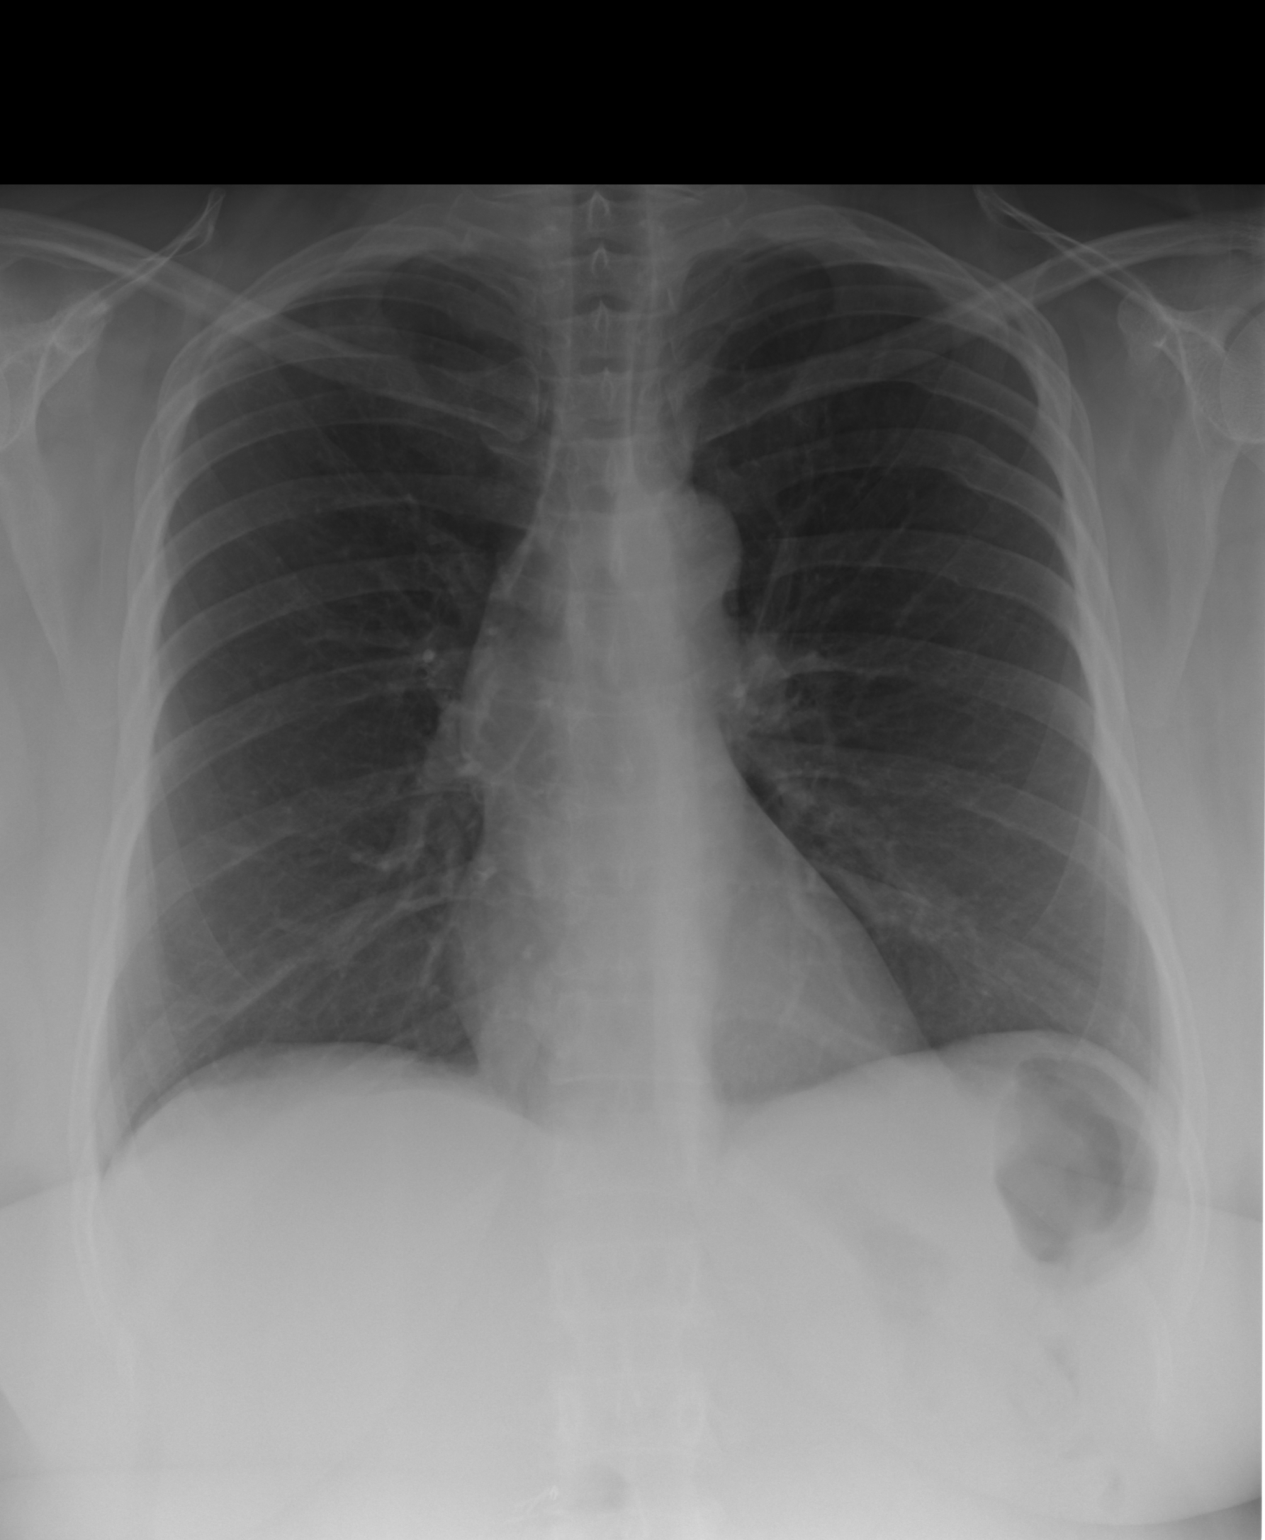
[im 2/2]
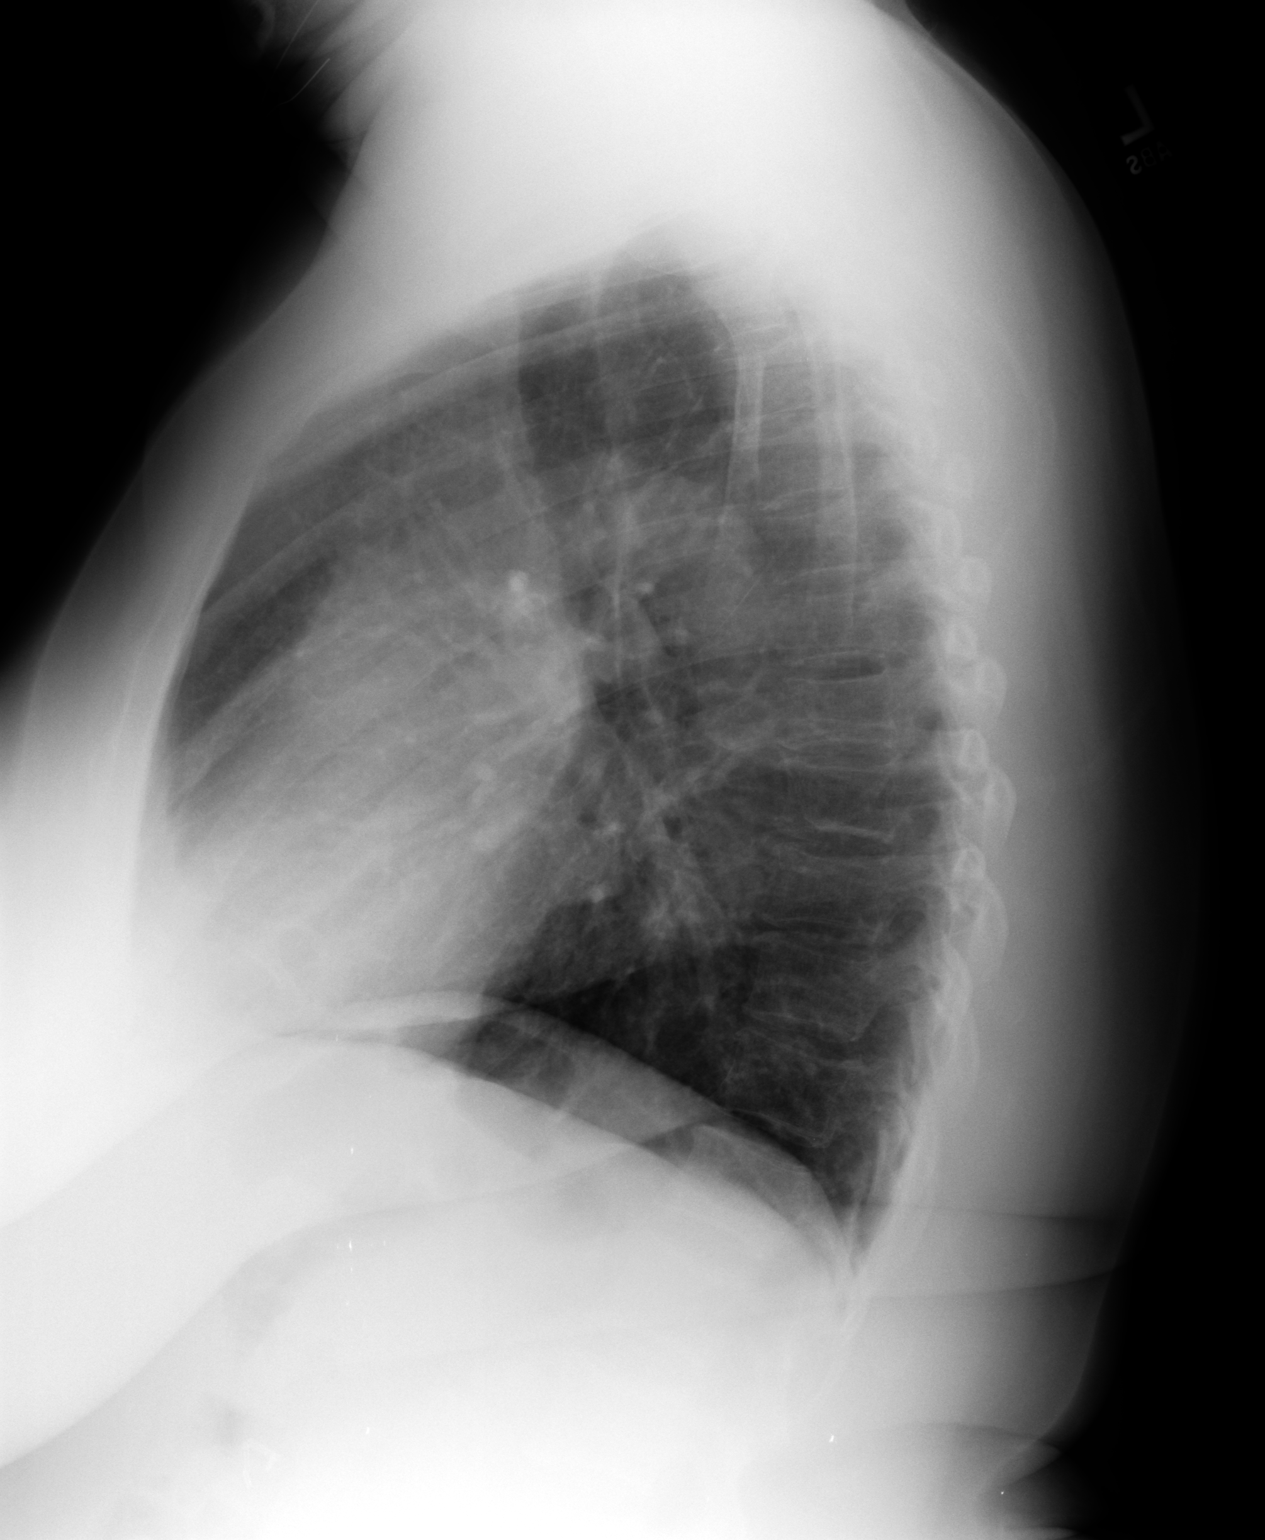

[2 of 2 positions shown; findings below may reference images not displayed]

IMPRESSION: There is mild hyperinflation which may reflect an element
of COPD or reactive airway disease. I cannot exclude acute bronchitis but
there is no evidence of pneumonia. Follow-up films are recommended if the
patient's cough persists and remains unexplained.

## 2010-06-02 ENCOUNTER — Emergency Department: Payer: Self-pay | Admitting: Unknown Physician Specialty

## 2010-06-04 ENCOUNTER — Emergency Department: Payer: Self-pay | Admitting: Emergency Medicine

## 2010-07-26 ENCOUNTER — Emergency Department: Payer: Self-pay | Admitting: Emergency Medicine

## 2010-08-03 ENCOUNTER — Observation Stay: Payer: Self-pay | Admitting: Internal Medicine

## 2010-08-29 ENCOUNTER — Emergency Department: Payer: Self-pay | Admitting: Unknown Physician Specialty

## 2010-10-26 ENCOUNTER — Emergency Department: Payer: Self-pay | Admitting: Emergency Medicine

## 2010-11-02 IMAGING — CR DG CHEST 2V
1 series · 2 of 2 positions shown · non-contrast
Comparison: none

REASON FOR EXAM: chest pain
COMMENTS:

[Series 1: view not recorded · 0.17mm/px · 2 of 2 slices shown]
[im 1/2]
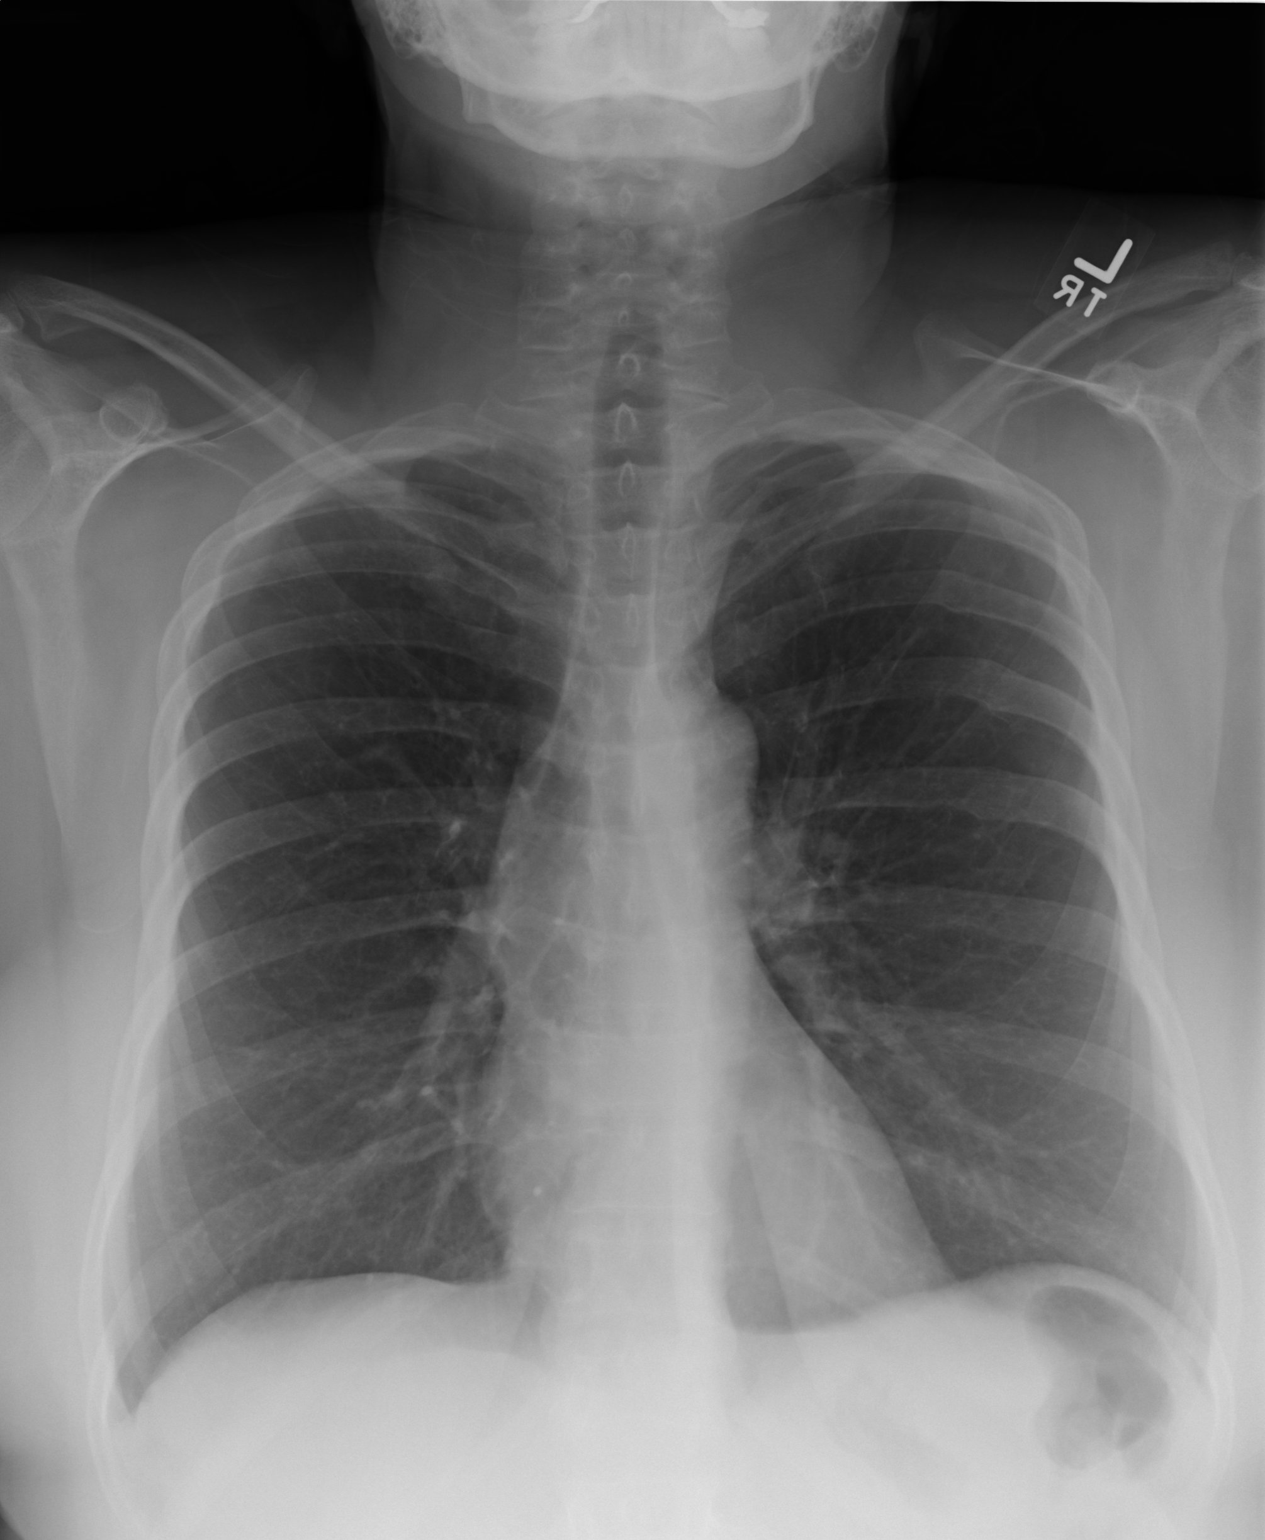
[im 2/2]
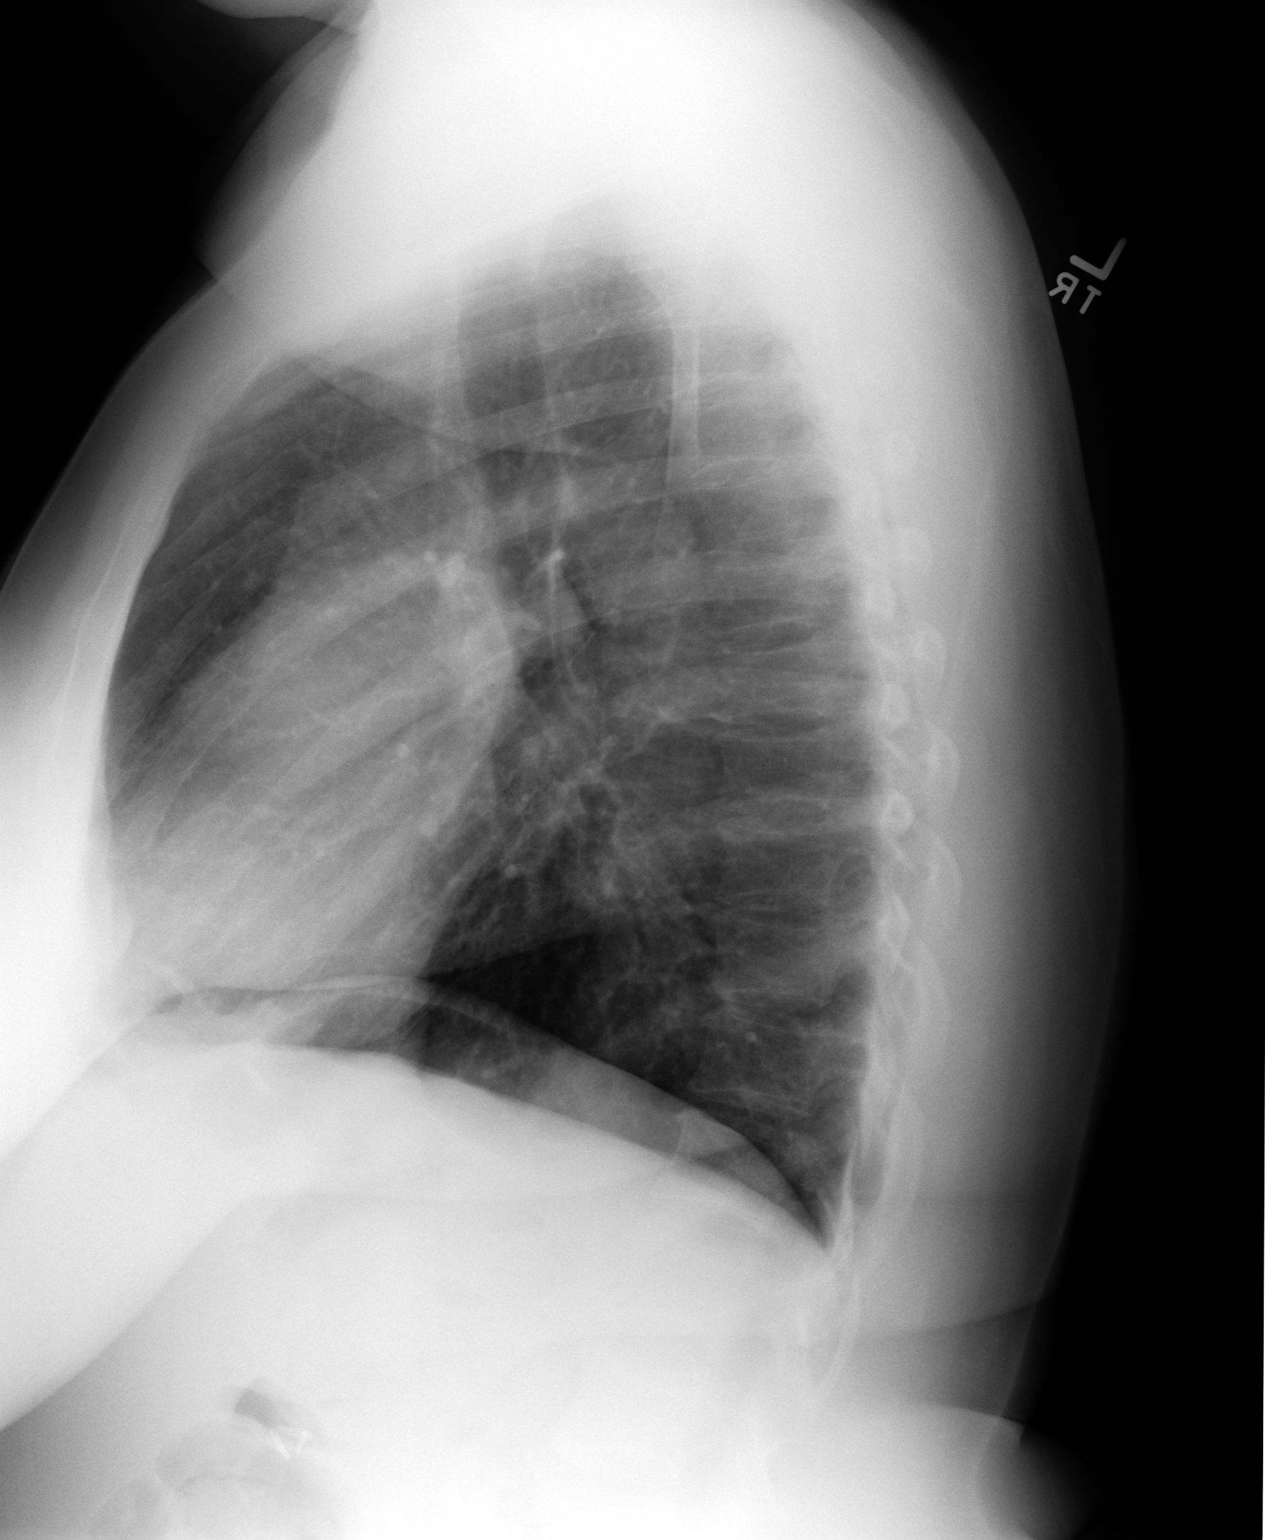

[2 of 2 positions shown; findings below may reference images not displayed]

PROCEDURE:     DXR - DXR CHEST PA (OR AP) AND LATERAL  - November 22, 2008  [DATE]

RESULT:     Comparison is made to the study of 06/20/2008.

The lungs are clear. The heart and pulmonary vessels are normal. The bony
and mediastinal structures are unremarkable except for old healed posterior
left rib fractures in the fifth and sixth ribs as demonstrated previously.
There is no effusion. There is no pneumothorax or evidence of congestive
failure.
IMPRESSION: No acute cardiopulmonary disease.

## 2010-11-13 ENCOUNTER — Inpatient Hospital Stay: Payer: Self-pay | Admitting: Internal Medicine

## 2011-01-02 ENCOUNTER — Emergency Department: Payer: Self-pay | Admitting: Unknown Physician Specialty

## 2011-01-30 ENCOUNTER — Emergency Department: Payer: Self-pay | Admitting: Internal Medicine

## 2011-04-18 ENCOUNTER — Inpatient Hospital Stay: Payer: Self-pay | Admitting: Internal Medicine

## 2011-06-03 ENCOUNTER — Emergency Department: Payer: Self-pay | Admitting: Internal Medicine

## 2011-07-09 DIAGNOSIS — K852 Alcohol induced acute pancreatitis without necrosis or infection: Secondary | ICD-10-CM

## 2011-07-09 HISTORY — DX: Alcohol induced acute pancreatitis without necrosis or infection: K85.20

## 2011-07-28 ENCOUNTER — Inpatient Hospital Stay: Payer: Self-pay | Admitting: Internal Medicine

## 2011-07-28 LAB — COMPREHENSIVE METABOLIC PANEL
Albumin: 3.5 g/dL (ref 3.4–5.0)
Alkaline Phosphatase: 124 U/L (ref 50–136)
Bilirubin,Total: 1 mg/dL (ref 0.2–1.0)
Calcium, Total: 9.5 mg/dL (ref 8.5–10.1)
Chloride: 107 mmol/L (ref 98–107)
Co2: 23 mmol/L (ref 21–32)
EGFR (African American): >60
EGFR (Non-African Amer.): >60
Glucose: 99 mg/dL (ref 65–99)
SGOT(AST): 211 U/L — ABNORMAL HIGH (ref 15–37)
SGPT (ALT): 134 U/L — ABNORMAL HIGH
Total Protein: 8.9 g/dL — ABNORMAL HIGH (ref 6.4–8.2)

## 2011-07-28 LAB — URINALYSIS, COMPLETE
Bilirubin,UR: NEGATIVE
Ketone: NEGATIVE
Ph: 5 (ref 4.5–8.0)
Protein: NEGATIVE
RBC,UR: 63 /HPF (ref 0–5)
Specific Gravity: 1.018 (ref 1.003–1.030)
Squamous Epithelial: 4
WBC UR: 16 /HPF (ref 0–5)

## 2011-07-28 LAB — CBC
HCT: 38.9 % (ref 35.0–47.0)
HGB: 13.1 g/dL (ref 12.0–16.0)
MCH: 33.2 pg (ref 26.0–34.0)
MCHC: 33.6 g/dL (ref 32.0–36.0)
MCV: 99 fL (ref 80–100)
RBC: 3.95 10*6/uL (ref 3.80–5.20)

## 2011-07-28 LAB — LIPASE, BLOOD: Lipase: 1083 U/L — ABNORMAL HIGH (ref 73–393)

## 2011-07-28 LAB — PHENYTOIN LEVEL, TOTAL: Dilantin: 3.3 ug/mL — ABNORMAL LOW (ref 10.0–20.0)

## 2011-07-29 LAB — CBC WITH DIFFERENTIAL/PLATELET
Basophil %: 0.2 %
Eosinophil %: 0 %
HCT: 34.1 % — ABNORMAL LOW (ref 35.0–47.0)
HGB: 11.3 g/dL — ABNORMAL LOW (ref 12.0–16.0)
Lymphocyte #: 0.8 10*3/uL — ABNORMAL LOW (ref 1.0–3.6)
MCH: 32.8 pg (ref 26.0–34.0)
MCV: 100 fL (ref 80–100)
Monocyte #: 0.3 10*3/uL (ref 0.0–0.7)
Neutrophil #: 3.1 10*3/uL (ref 1.4–6.5)
RBC: 3.42 10*6/uL — ABNORMAL LOW (ref 3.80–5.20)

## 2011-07-29 LAB — BASIC METABOLIC PANEL
Anion Gap: 11 (ref 7–16)
BUN: 10 mg/dL (ref 7–18)
Calcium, Total: 8.6 mg/dL (ref 8.5–10.1)
Chloride: 105 mmol/L (ref 98–107)
Creatinine: 0.85 mg/dL (ref 0.60–1.30)
EGFR (Non-African Amer.): >60
Glucose: 94 mg/dL (ref 65–99)
Osmolality: 276 (ref 275–301)
Potassium: 3.7 mmol/L (ref 3.5–5.1)

## 2011-07-29 LAB — DRUG SCREEN, URINE
Amphetamines, Ur Screen: NEGATIVE (ref ?–1000)
Barbiturates, Ur Screen: NEGATIVE (ref ?–200)
Benzodiazepine, Ur Scrn: NEGATIVE (ref ?–200)
Cannabinoid 50 Ng, Ur ~~LOC~~: NEGATIVE (ref ?–50)
Cocaine Metabolite,Ur ~~LOC~~: NEGATIVE (ref ?–300)
Opiate, Ur Screen: POSITIVE (ref ?–300)
Phencyclidine (PCP) Ur S: NEGATIVE (ref ?–25)

## 2011-07-29 LAB — LIPID PANEL
HDL Cholesterol: 31 mg/dL — ABNORMAL LOW (ref 40–60)
Ldl Cholesterol, Calc: 129 mg/dL — ABNORMAL HIGH (ref 0–100)
Triglycerides: 83 mg/dL (ref 0–200)

## 2011-07-29 LAB — RAPID HIV-1/2 QL/CONFIRM: HIV-1/2,Rapid Ql: NEGATIVE

## 2011-07-30 LAB — CBC WITH DIFFERENTIAL/PLATELET
Basophil #: 0 10*3/uL (ref 0.0–0.1)
Basophil %: 0.4 %
Eosinophil #: 0 10*3/uL (ref 0.0–0.7)
HCT: 32.7 % — ABNORMAL LOW (ref 35.0–47.0)
HGB: 10.9 g/dL — ABNORMAL LOW (ref 12.0–16.0)
Lymphocyte #: 1.8 10*3/uL (ref 1.0–3.6)
Lymphocyte %: 24.9 %
MCHC: 33.2 g/dL (ref 32.0–36.0)
Neutrophil %: 62.4 %
Platelet: 140 10*3/uL — ABNORMAL LOW (ref 150–440)
RDW: 14.6 % — ABNORMAL HIGH (ref 11.5–14.5)
WBC: 7.4 10*3/uL (ref 3.6–11.0)

## 2011-07-30 LAB — COMPREHENSIVE METABOLIC PANEL
Anion Gap: 11 (ref 7–16)
BUN: 16 mg/dL (ref 7–18)
Bilirubin,Total: 0.6 mg/dL (ref 0.2–1.0)
Chloride: 106 mmol/L (ref 98–107)
Creatinine: 1.07 mg/dL (ref 0.60–1.30)
EGFR (African American): >60
EGFR (Non-African Amer.): 57 — ABNORMAL LOW
Osmolality: 289 (ref 275–301)
Potassium: 3.7 mmol/L (ref 3.5–5.1)
Sodium: 143 mmol/L (ref 136–145)
Total Protein: 7.3 g/dL (ref 6.4–8.2)

## 2011-07-30 LAB — PROTIME-INR: INR: 1.1

## 2011-07-30 LAB — MAGNESIUM: Magnesium: 2.1 mg/dL

## 2011-07-31 LAB — COMPREHENSIVE METABOLIC PANEL
Albumin: 3.1 g/dL — ABNORMAL LOW (ref 3.4–5.0)
Alkaline Phosphatase: 87 U/L (ref 50–136)
Anion Gap: 8 (ref 7–16)
BUN: 8 mg/dL (ref 7–18)
Bilirubin,Total: 0.6 mg/dL (ref 0.2–1.0)
Chloride: 107 mmol/L (ref 98–107)
Creatinine: 0.75 mg/dL (ref 0.60–1.30)
EGFR (Non-African Amer.): >60
Glucose: 105 mg/dL — ABNORMAL HIGH (ref 65–99)
Osmolality: 282 (ref 275–301)
Potassium: 3.9 mmol/L (ref 3.5–5.1)
SGOT(AST): 129 U/L — ABNORMAL HIGH (ref 15–37)
SGPT (ALT): 124 U/L — ABNORMAL HIGH
Sodium: 142 mmol/L (ref 136–145)
Total Protein: 7.3 g/dL (ref 6.4–8.2)

## 2011-07-31 LAB — CBC WITH DIFFERENTIAL/PLATELET
Basophil %: 0.3 %
Eosinophil #: 0 10*3/uL (ref 0.0–0.7)
HCT: 32.7 % — ABNORMAL LOW (ref 35.0–47.0)
HGB: 10.7 g/dL — ABNORMAL LOW (ref 12.0–16.0)
Lymphocyte #: 1.7 10*3/uL (ref 1.0–3.6)
Lymphocyte %: 28.2 %
MCHC: 32.8 g/dL (ref 32.0–36.0)
MCV: 101 fL — ABNORMAL HIGH (ref 80–100)
Neutrophil #: 3.5 10*3/uL (ref 1.4–6.5)
Neutrophil %: 57.4 %
Platelet: 153 10*3/uL (ref 150–440)
RDW: 14 % (ref 11.5–14.5)

## 2011-07-31 LAB — PHENYTOIN LEVEL, TOTAL: Dilantin: 4.9 ug/mL — ABNORMAL LOW (ref 10.0–20.0)

## 2011-08-01 ENCOUNTER — Inpatient Hospital Stay: Payer: Self-pay | Admitting: Internal Medicine

## 2011-08-01 LAB — CBC WITH DIFFERENTIAL/PLATELET
Basophil #: 0 10*3/uL (ref 0.0–0.1)
Basophil %: 0.4 %
Eosinophil #: 0.1 10*3/uL (ref 0.0–0.7)
Eosinophil %: 1 %
Lymphocyte #: 2 10*3/uL (ref 1.0–3.6)
Lymphocyte %: 29.9 %
MCH: 33.3 pg (ref 26.0–34.0)
MCHC: 33 g/dL (ref 32.0–36.0)
MCV: 101 fL — ABNORMAL HIGH (ref 80–100)
Monocyte #: 0.9 10*3/uL — ABNORMAL HIGH (ref 0.0–0.7)
Neutrophil #: 3.7 10*3/uL (ref 1.4–6.5)
Platelet: 159 10*3/uL (ref 150–440)
RDW: 14.1 % (ref 11.5–14.5)

## 2011-08-01 LAB — COMPREHENSIVE METABOLIC PANEL
Albumin: 2.9 g/dL — ABNORMAL LOW (ref 3.4–5.0)
Alkaline Phosphatase: 91 U/L (ref 50–136)
BUN: 9 mg/dL (ref 7–18)
Bilirubin,Total: 0.5 mg/dL (ref 0.2–1.0)
Calcium, Total: 8.4 mg/dL — ABNORMAL LOW (ref 8.5–10.1)
Chloride: 107 mmol/L (ref 98–107)
Co2: 26 mmol/L (ref 21–32)
EGFR (African American): >60
Osmolality: 288 (ref 275–301)
Potassium: 3.7 mmol/L (ref 3.5–5.1)
SGOT(AST): 110 U/L — ABNORMAL HIGH (ref 15–37)
Sodium: 143 mmol/L (ref 136–145)

## 2011-08-10 IMAGING — CR DG CHEST 2V
1 series · 2 of 2 positions shown · non-contrast
Comparison: none

REASON FOR EXAM: Rayso Yurub cough fever
COMMENTS:   May transport without cardiac monitor

PROCEDURE:     DXR - DXR CHEST PA (OR AP) AND LATERAL  - August 30, 2009  [DATE]
RESULT:     Comparison is made to the prior exam of 11/22/2008. The lung
fields are clear. The heart, mediastinal and osseous structures are normal
in appearance.

[Series 1: view not recorded · 0.17mm/px · 2 of 2 slices shown]
[im 1/2]
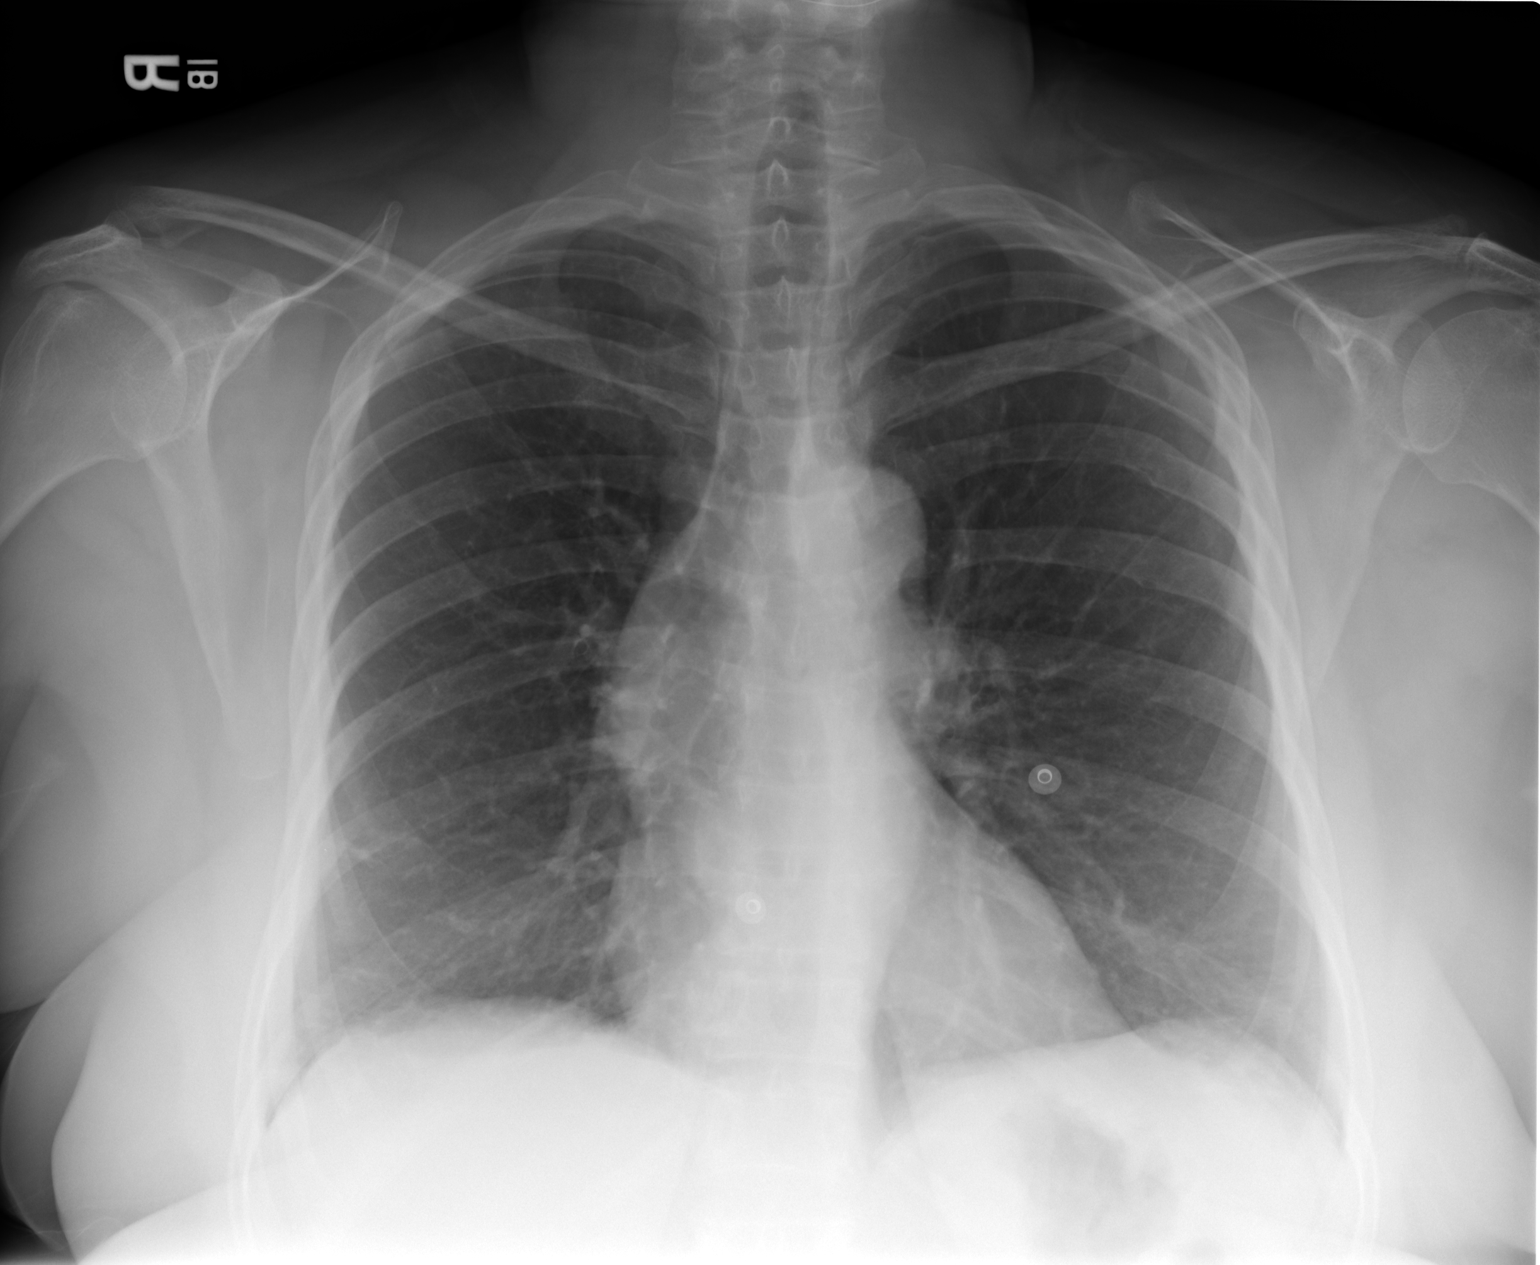
[im 2/2]
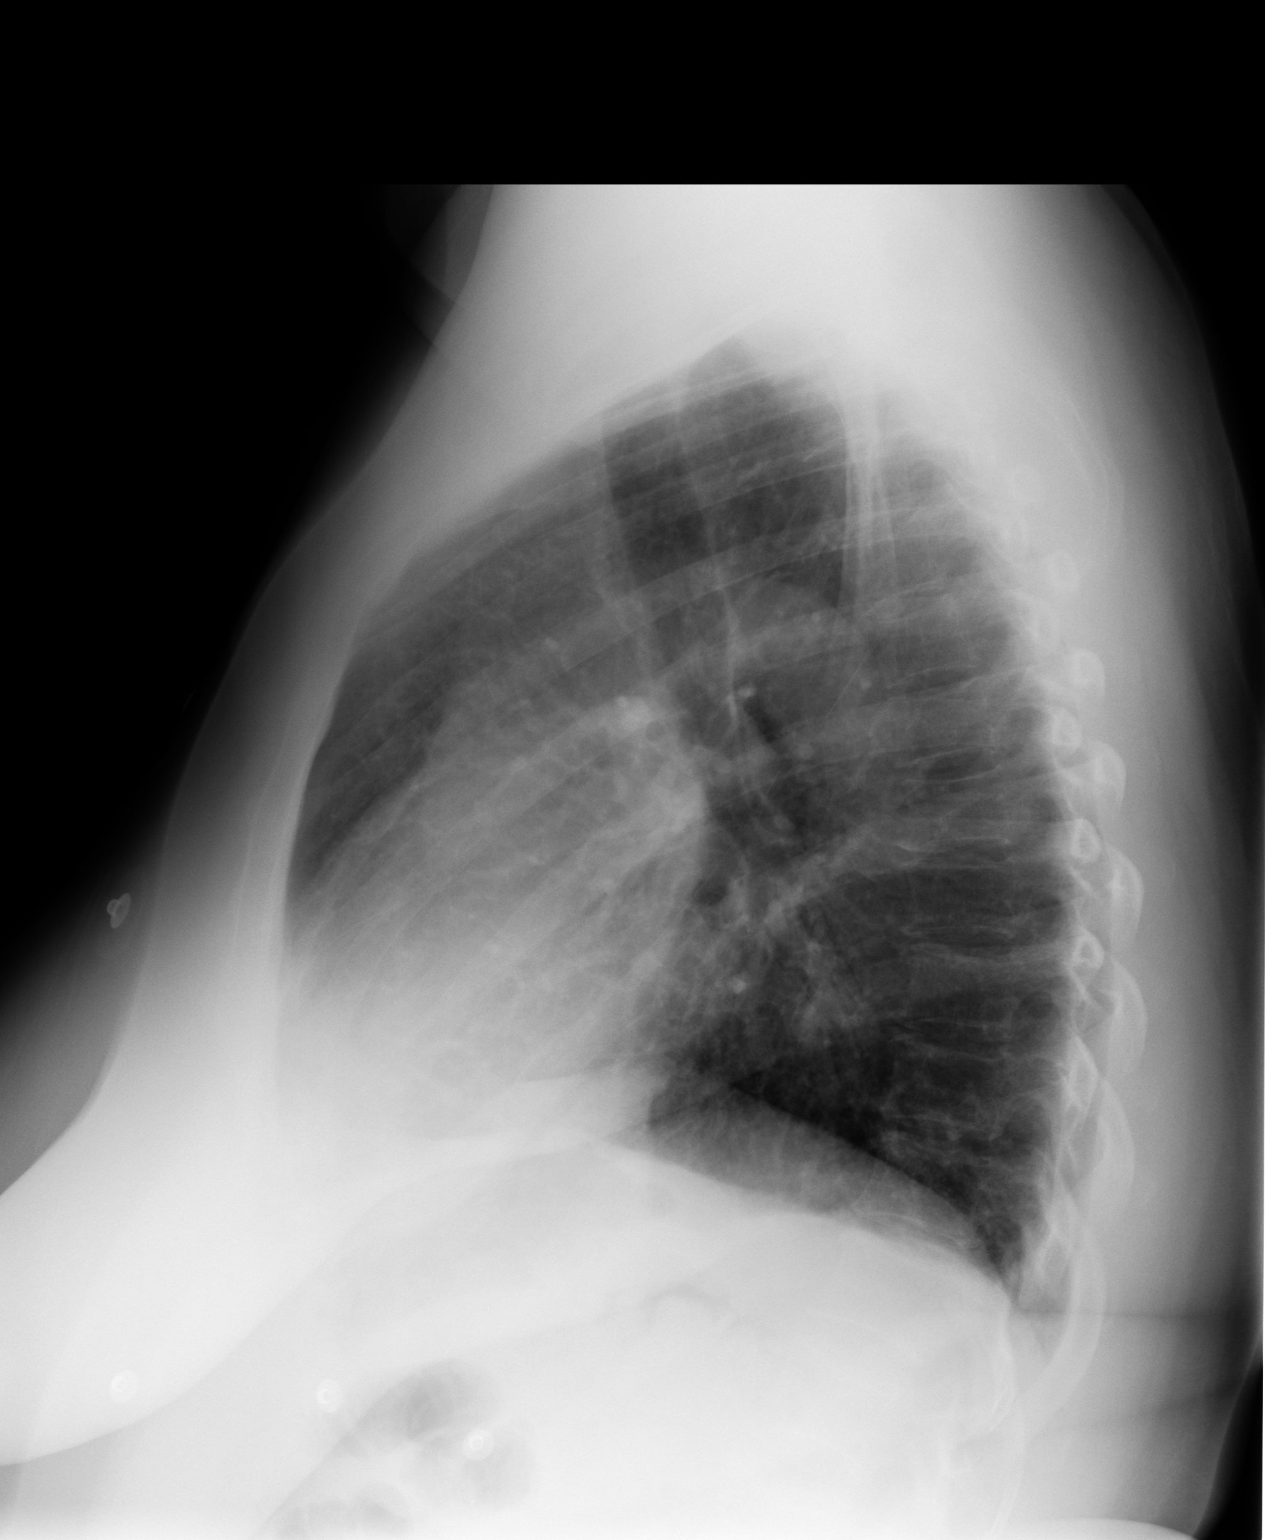

[2 of 2 positions shown; findings below may reference images not displayed]

IMPRESSION: 1. No significant abnormalities are noted.
2. Incidental note is again made of multiple old healed rib fractures on
left.

## 2011-08-25 IMAGING — CT CT HEAD WITHOUT CONTRAST
2 series · 16 of 30 positions shown, 20 images · non-contrast
Comparison: none

REASON FOR EXAM: syncope
COMMENTS:

PROCEDURE:     CT  - CT HEAD WITHOUT CONTRAST  - September 14, 2009 [DATE]
RESULT:     Comparison:  12/18/2006, 07/02/2006
TECHNIQUE: Multiple axial images from the foramen magnum to the vertex were
obtained without IV contrast.

[Series 2: without · axial · non-contrast · 0.43mm/px · z∈[-184,-64]mm · 13 of 29 slices shown, 17 images]
[im 3/29  brain]
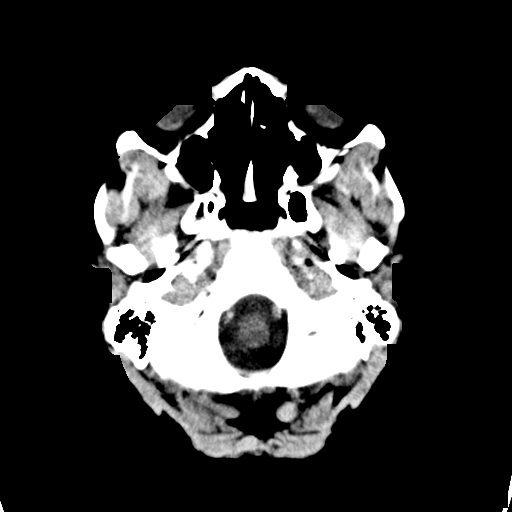
[im 3/29  bone]
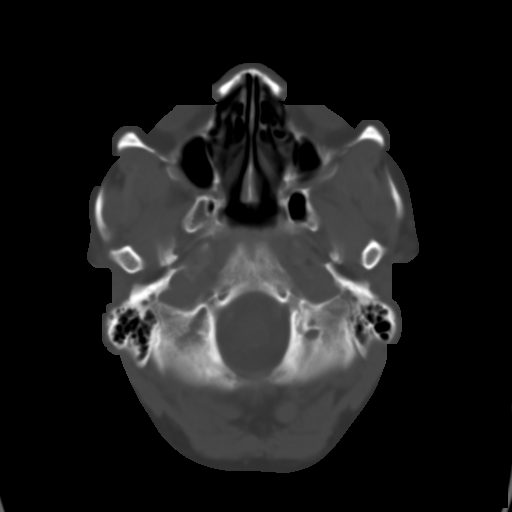
[im 5/29  brain]
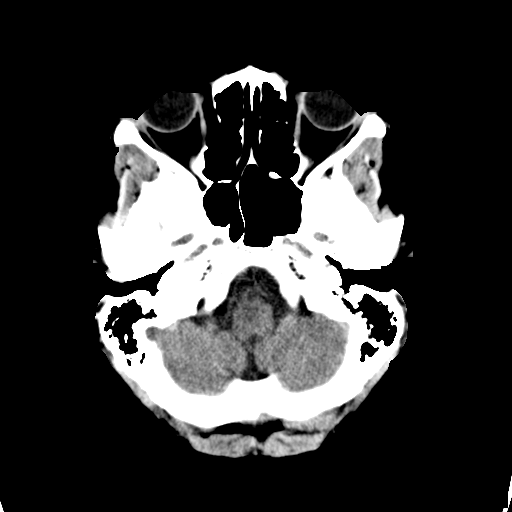
[im 7/29  brain]
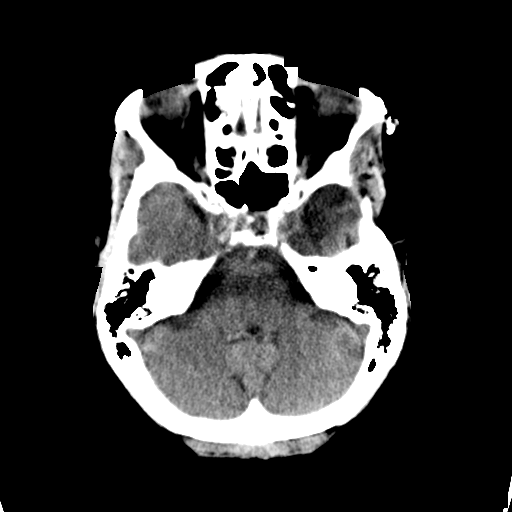
[im 9/29  brain]
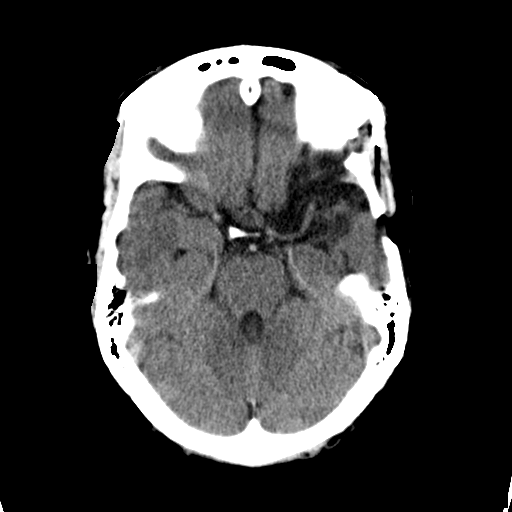
[im 11/29  brain]
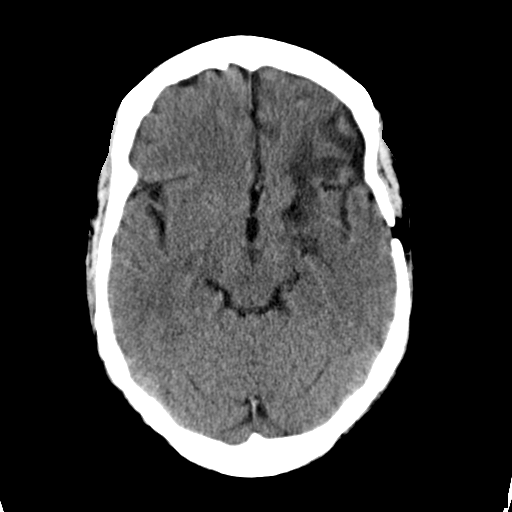
[im 11/29  bone]
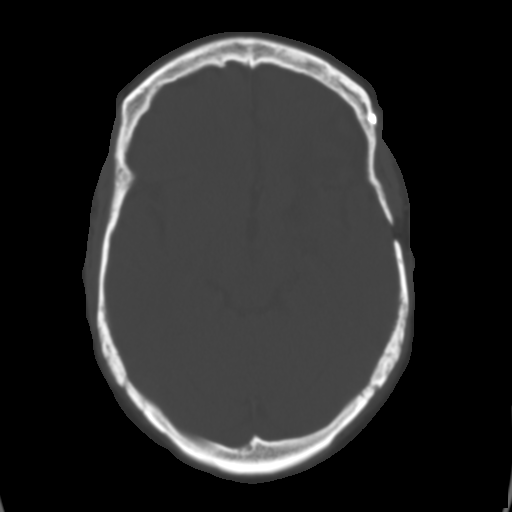
[im 13/29  brain]
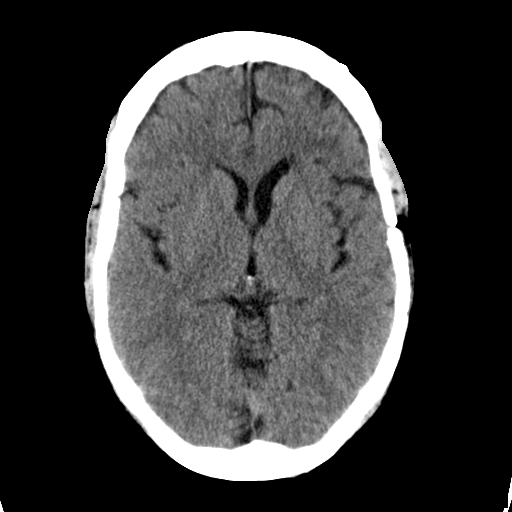
[im 15/29  brain]
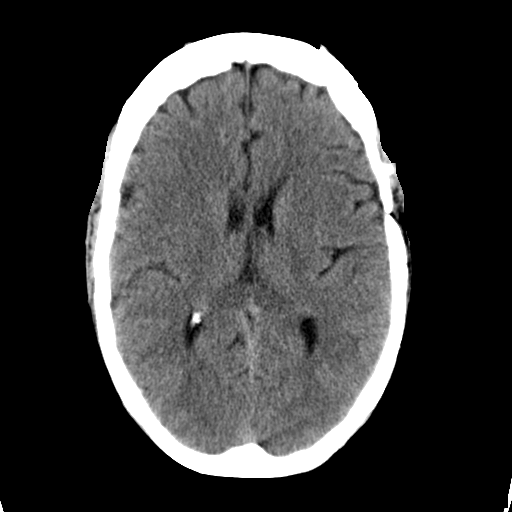
[im 17/29  brain]
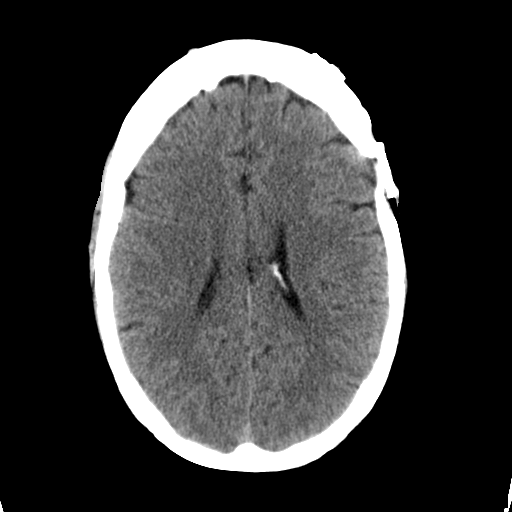
[im 19/29  brain]
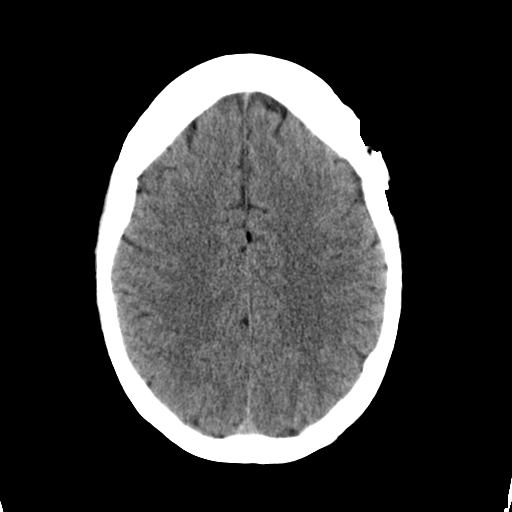
[im 19/29  bone]
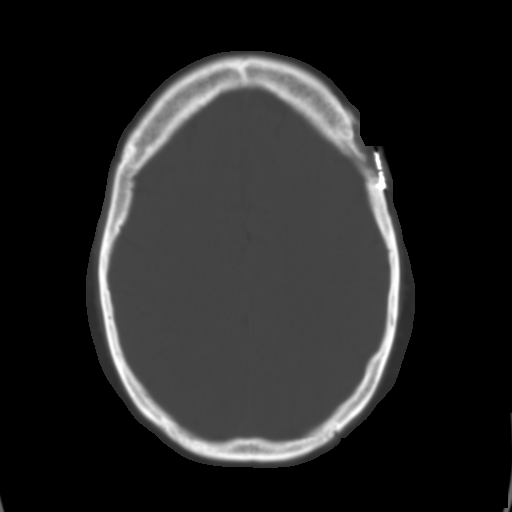
[im 21/29  brain]
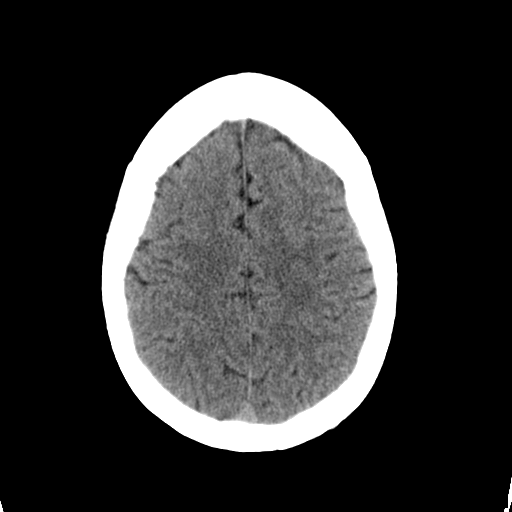
[im 23/29  brain]
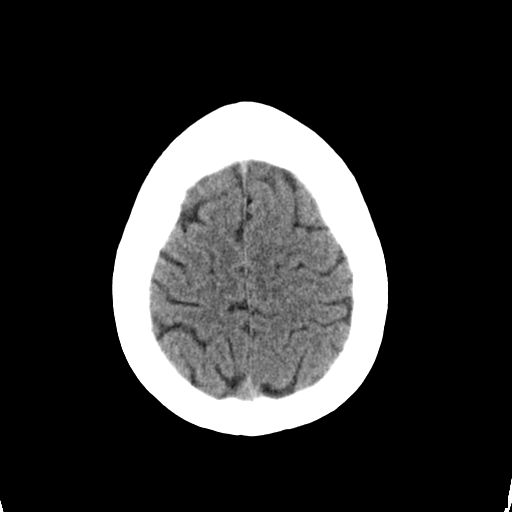
[im 25/29  brain]
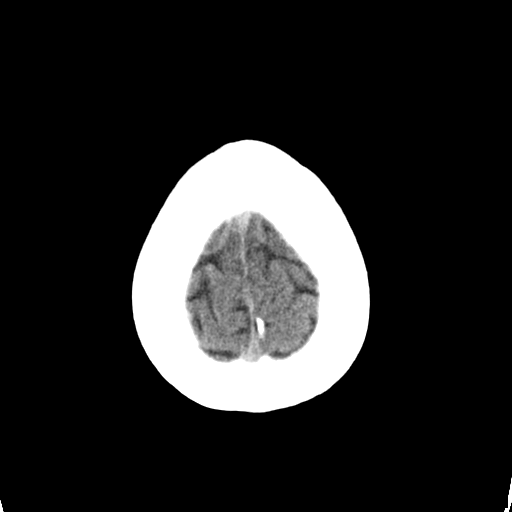
[im 27/29  brain]
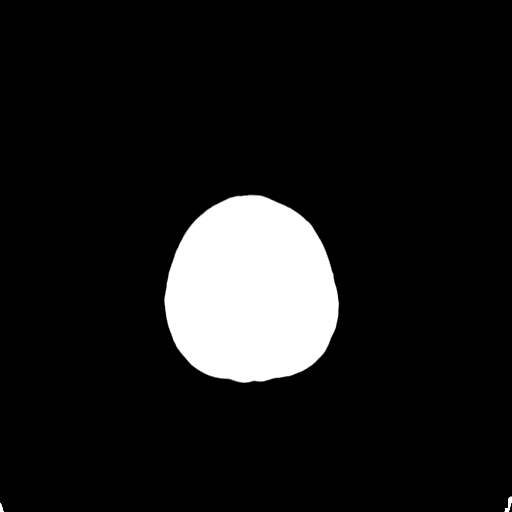
[im 27/29  bone]
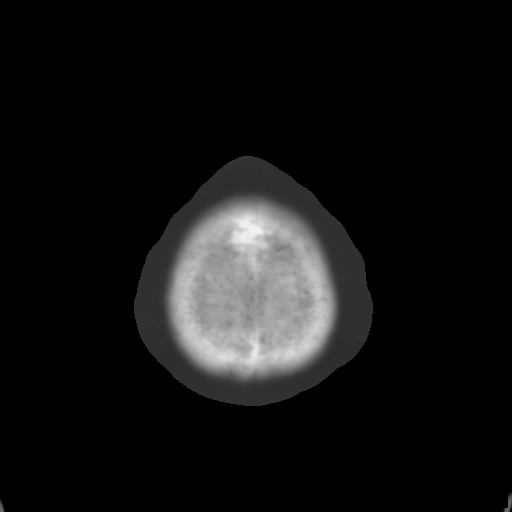

[Series 3: bone · axial · 0.43mm/px · z∈[-184,-144]mm · 3 of 29 slices shown]
[im 3/29  bone]
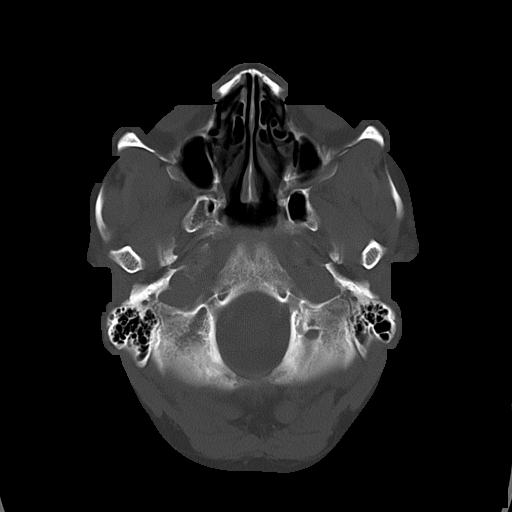
[im 7/29  bone]
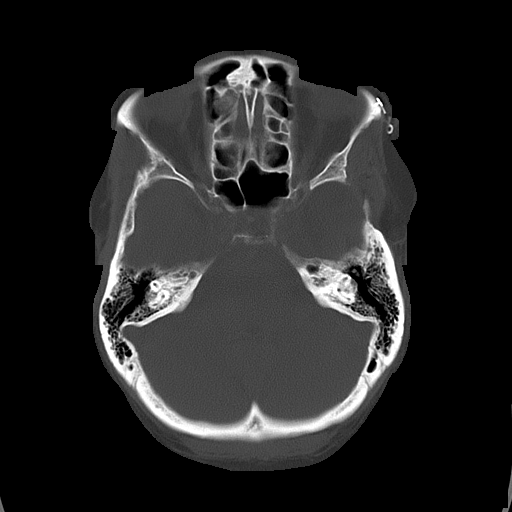
[im 11/29  bone]
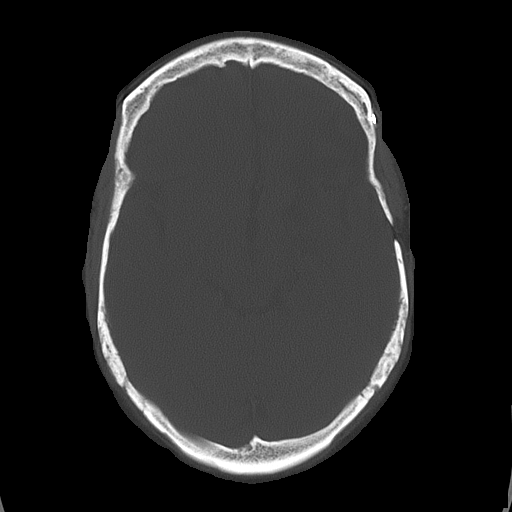

[16 of 30 positions shown; findings below may reference images not displayed]

FINDINGS: There is no evidence of mass effect, midline shift, or extra-axial fluid
collections.  There is no evidence of a space-occupying lesion or
intracranial hemorrhage. There is no evidence of a cortical-based area of
acute infarction. There is left frontal lobe encephalomalacia.

The ventricles and sulci are appropriate for the patient's age. The basal
cisterns are patent.

Visualized portions of the orbits are unremarkable. The visualized portions
of the paranasal sinuses and mastoid air cells are unremarkable.

There is evidence of prior left frontal craniotomy.
IMPRESSION: No acute intracranial process.

## 2011-08-25 IMAGING — CR DG CHEST 2V
1 series · 2 of 2 positions shown · non-contrast
Comparison: none

REASON FOR EXAM: cp
COMMENTS:

[Series 1: view not recorded · 0.17mm/px · 2 of 2 slices shown]
[im 1/2]
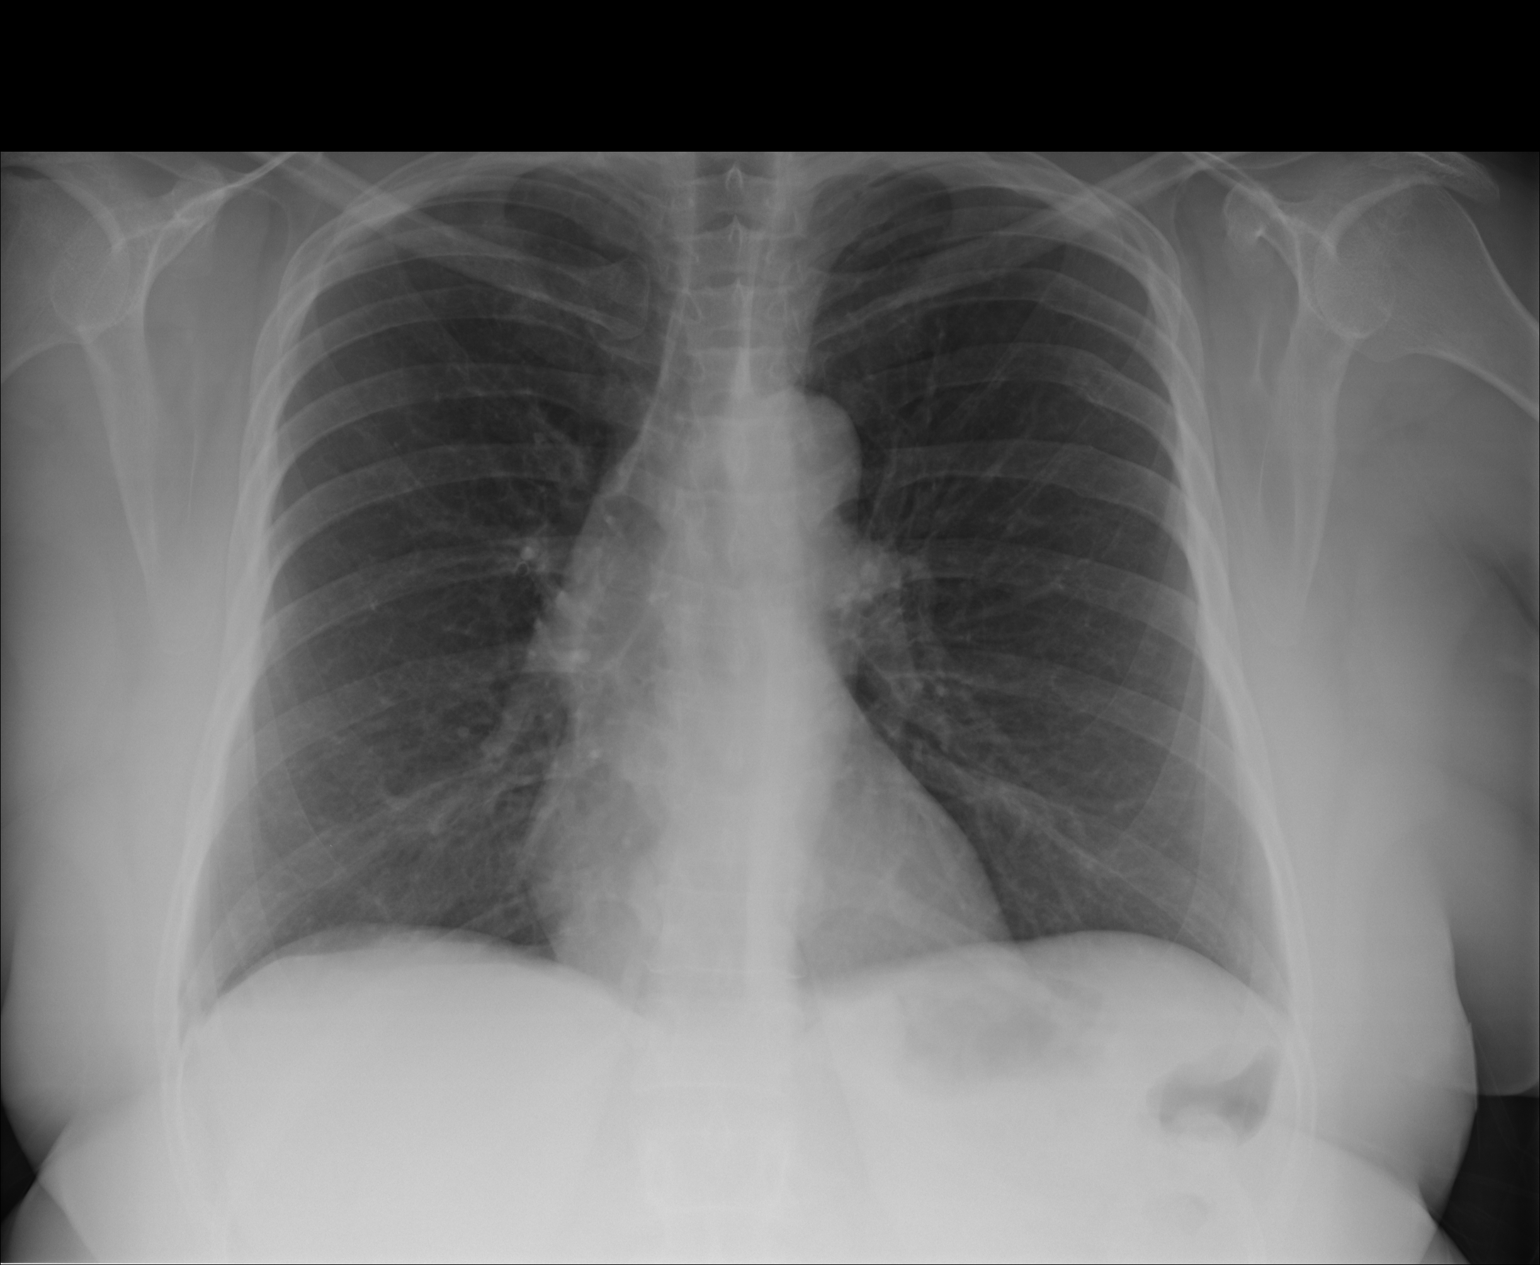
[im 2/2]
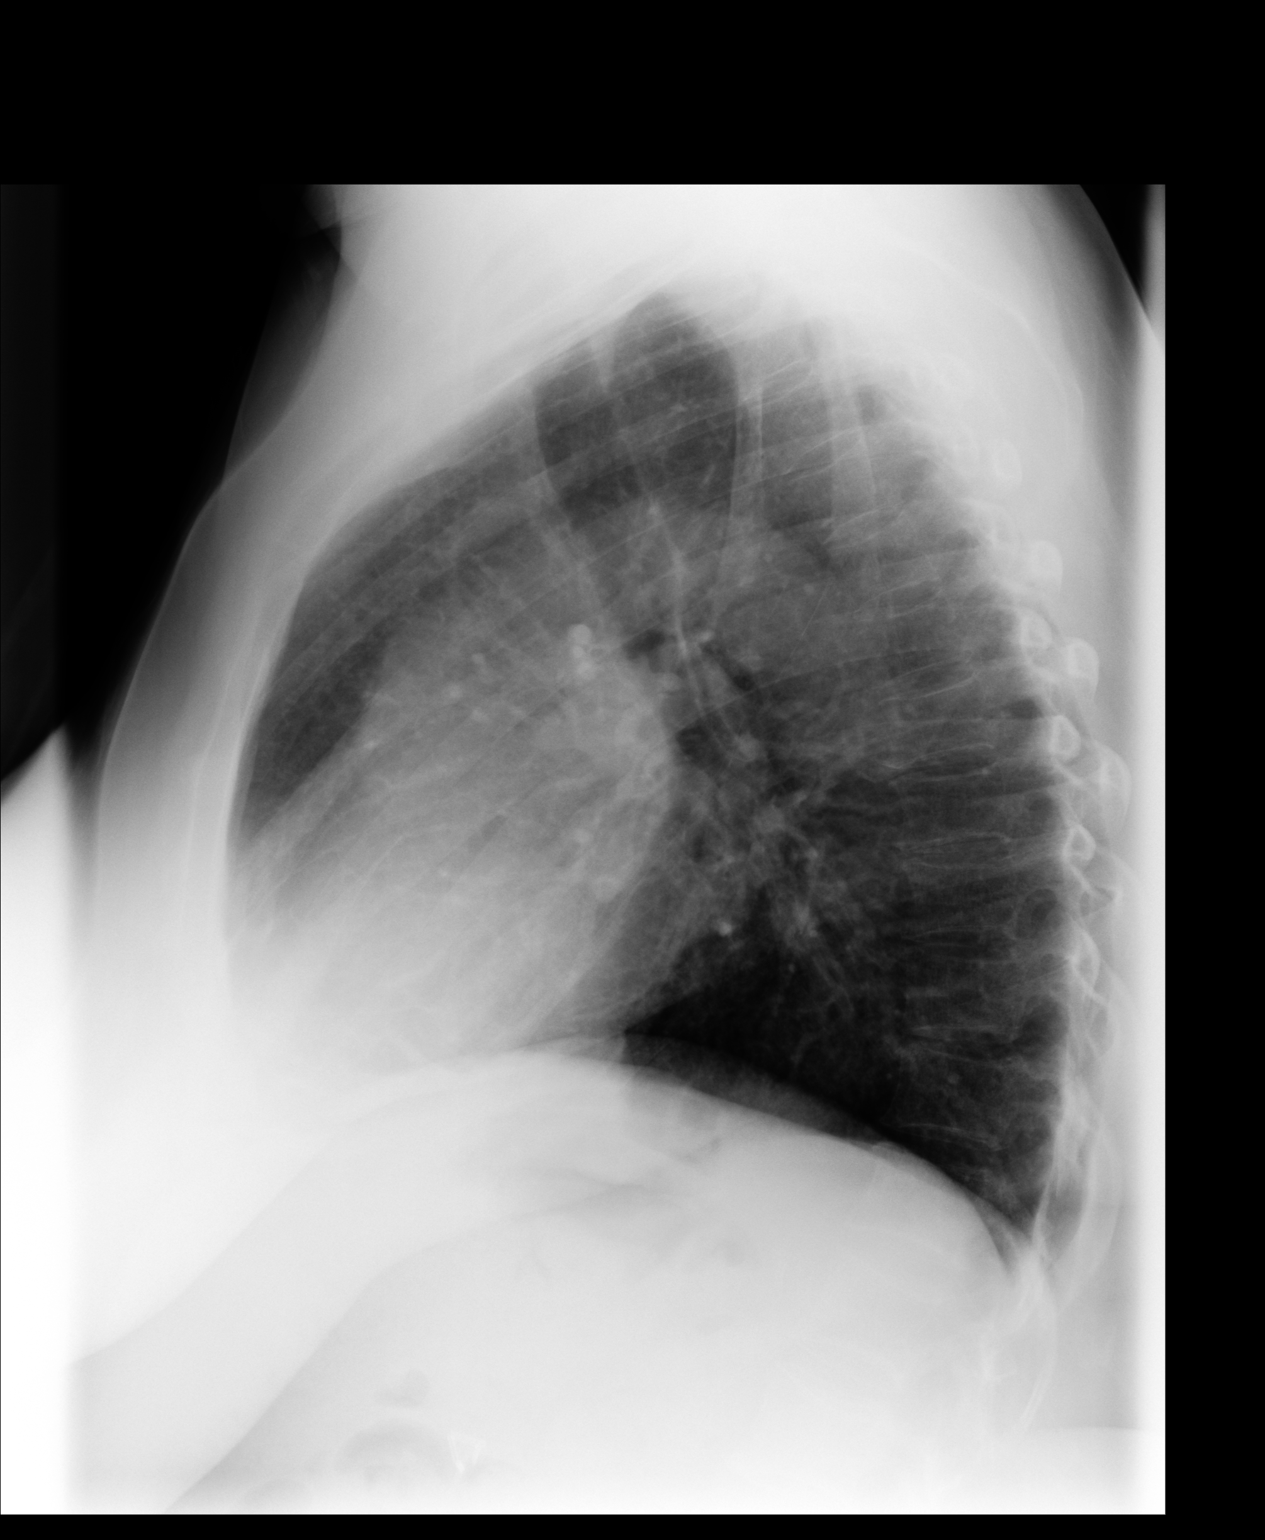

[2 of 2 positions shown; findings below may reference images not displayed]

PROCEDURE:     DXR - DXR CHEST PA (OR AP) AND LATERAL  - September 14, 2009  [DATE]

RESULT:     Comparison is made to the study of 08/30/2009.

The lungs are clear. The heart and pulmonary vessels are normal. The bony
and mediastinal structures are unremarkable. There is no effusion. There is
no pneumothorax or evidence of congestive failure.
IMPRESSION: No acute cardiopulmonary disease.

## 2011-09-09 ENCOUNTER — Emergency Department: Payer: Self-pay | Admitting: Emergency Medicine

## 2011-09-09 LAB — CBC
HCT: 39.1 % (ref 35.0–47.0)
MCH: 33.2 pg (ref 26.0–34.0)
MCHC: 33 g/dL (ref 32.0–36.0)
RDW: 14.4 % (ref 11.5–14.5)
WBC: 5.8 10*3/uL (ref 3.6–11.0)

## 2011-09-09 LAB — COMPREHENSIVE METABOLIC PANEL
Anion Gap: 14 (ref 7–16)
BUN: 6 mg/dL — ABNORMAL LOW (ref 7–18)
Calcium, Total: 9.3 mg/dL (ref 8.5–10.1)
Chloride: 104 mmol/L (ref 98–107)
EGFR (African American): >60
EGFR (Non-African Amer.): >60
Potassium: 3.8 mmol/L (ref 3.5–5.1)
SGOT(AST): 158 U/L — ABNORMAL HIGH (ref 15–37)
SGPT (ALT): 109 U/L — ABNORMAL HIGH
Sodium: 142 mmol/L (ref 136–145)
Total Protein: 8.8 g/dL — ABNORMAL HIGH (ref 6.4–8.2)

## 2011-09-09 LAB — LIPASE, BLOOD: Lipase: 767 U/L — ABNORMAL HIGH (ref 73–393)

## 2011-09-25 ENCOUNTER — Emergency Department: Payer: Self-pay | Admitting: Unknown Physician Specialty

## 2011-09-25 LAB — COMPREHENSIVE METABOLIC PANEL
Albumin: 3.5 g/dL (ref 3.4–5.0)
Alkaline Phosphatase: 125 U/L (ref 50–136)
Anion Gap: 10 (ref 7–16)
Bilirubin,Total: 1.1 mg/dL — ABNORMAL HIGH (ref 0.2–1.0)
Calcium, Total: 9 mg/dL (ref 8.5–10.1)
Co2: 22 mmol/L (ref 21–32)
Creatinine: 0.77 mg/dL (ref 0.60–1.30)
EGFR (Non-African Amer.): >60
Osmolality: 273 (ref 275–301)
Potassium: 3.4 mmol/L — ABNORMAL LOW (ref 3.5–5.1)
Sodium: 137 mmol/L (ref 136–145)
Total Protein: 8.4 g/dL — ABNORMAL HIGH (ref 6.4–8.2)

## 2011-09-25 LAB — CBC WITH DIFFERENTIAL/PLATELET
Basophil #: 0 10*3/uL (ref 0.0–0.1)
Basophil %: 0.4 %
Eosinophil #: 0.1 10*3/uL (ref 0.0–0.7)
HCT: 38.8 % (ref 35.0–47.0)
Lymphocyte %: 30.4 %
MCH: 33.6 pg (ref 26.0–34.0)
MCHC: 33.8 g/dL (ref 32.0–36.0)
MCV: 99 fL (ref 80–100)
Monocyte %: 11.3 %
Neutrophil #: 3.7 10*3/uL (ref 1.4–6.5)
Platelet: 150 10*3/uL (ref 150–440)
RDW: 14 % (ref 11.5–14.5)
WBC: 6.6 10*3/uL (ref 3.6–11.0)

## 2011-09-25 LAB — APTT: Activated PTT: 30.4 secs (ref 23.6–35.9)

## 2011-09-25 LAB — TROPONIN I: Troponin-I: <0.02 ng/mL

## 2011-09-25 LAB — PROTIME-INR
INR: 1.1
Prothrombin Time: 14.6 secs (ref 11.5–14.7)

## 2011-10-26 IMAGING — CR DG CHEST 2V
1 series · 2 of 2 positions shown · non-contrast
Comparison: none

REASON FOR EXAM: cough, chest pain
COMMENTS:

PROCEDURE:     DXR - DXR CHEST PA (OR AP) AND LATERAL  - November 15, 2009  [DATE]
RESULT:     The lungs are clear. The cardiac silhouette and visualized bony
skeleton are unremarkable.

[Series 1: view not recorded · 0.17mm/px · 2 of 2 slices shown]
[im 1/2]
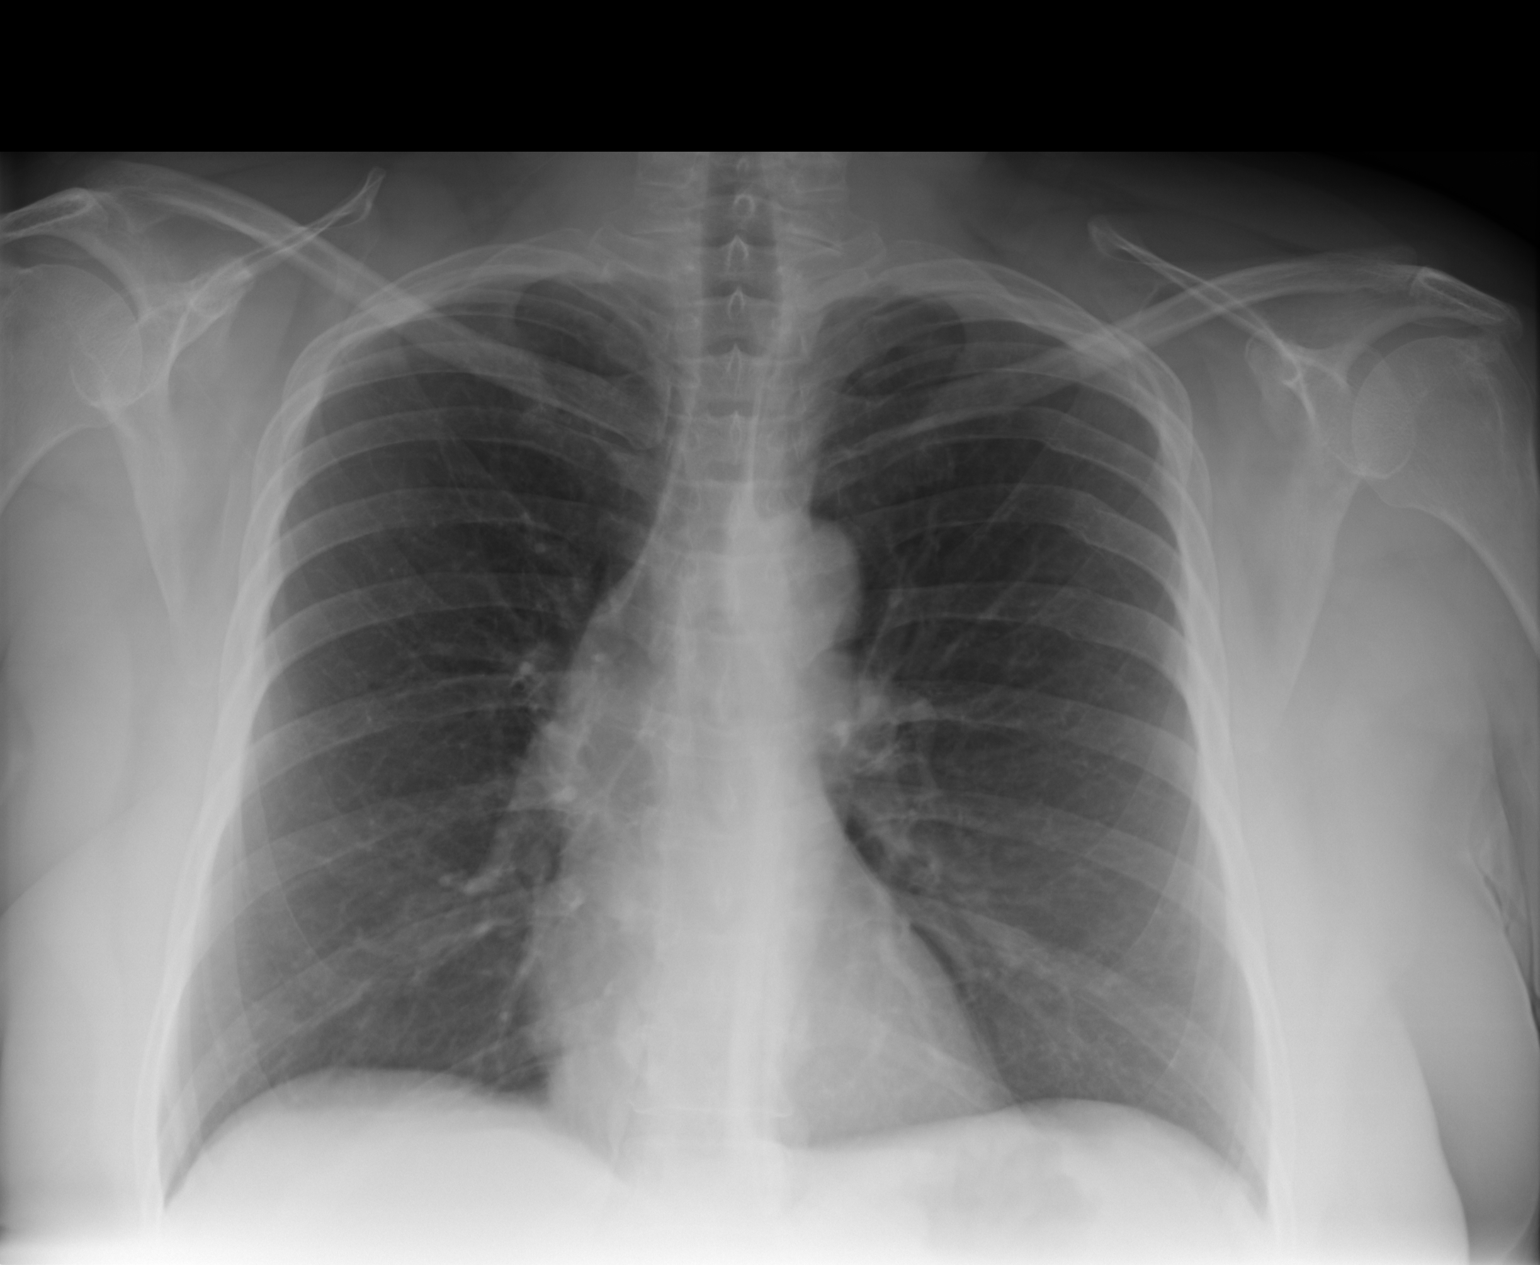
[im 2/2]
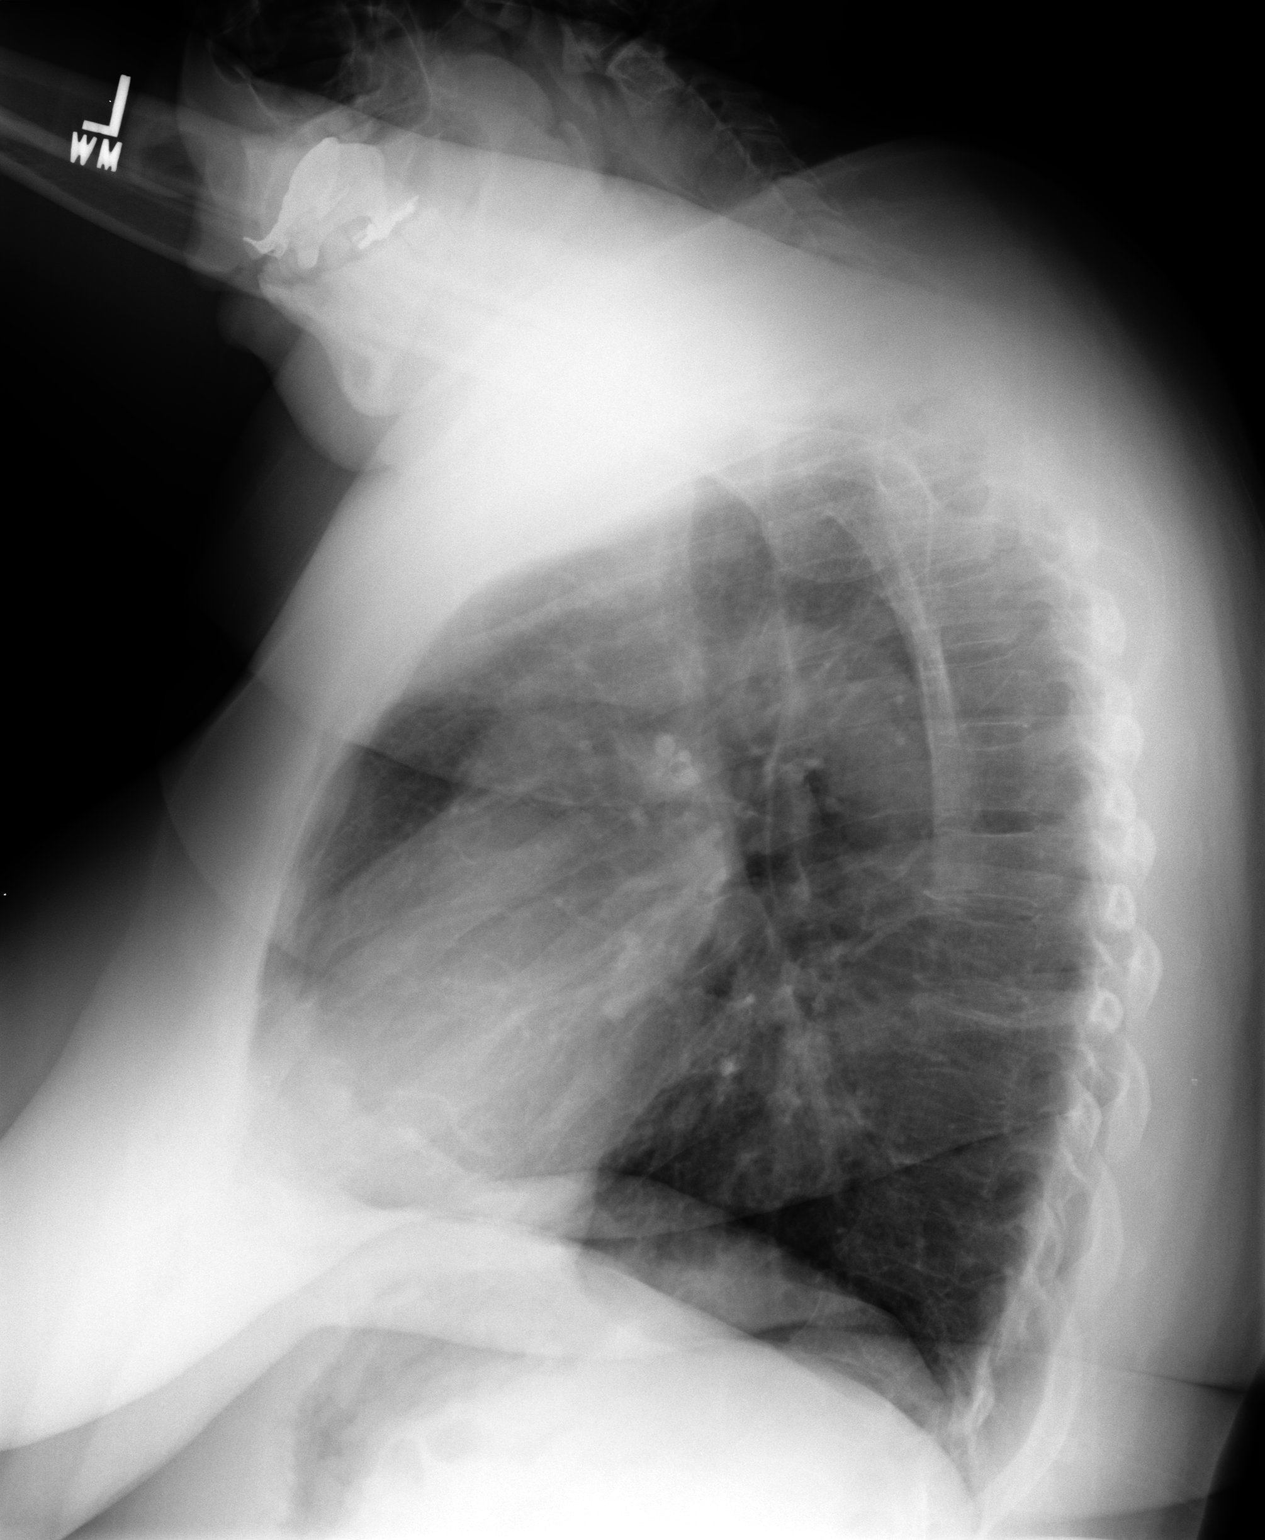

[2 of 2 positions shown; findings below may reference images not displayed]

IMPRESSION: 1. Chest radiograph without evidence of acute cardiopulmonary disease.
2. Comparison made to prior study dated 09/14/2009.

## 2011-10-26 IMAGING — CT CT CHEST W/ CM
1 series · 16 of 34 positions shown, 20 images · non-contrast
Comparison: none

REASON FOR EXAM: h/o brain tumor and "blood clots" here w/ SOB & CP
COMMENTS:

PROCEDURE:     CT  - CT CHEST (FOR PE) W  - November 15, 2009 [DATE]
RESULT:
Comparison is made to a prior study dated 02/25/2008.
TECHNIQUE: Helical 3 mm sections were obtained from the thoracic inlet
through the lung bases status post intravenous administration of 100 ml of
Usovue-JFO.

[Series 5: soft tissue · axial · 0.74mm/px · z∈[-276,-54]mm · 16 of 85 slices shown, 20 images]
[im 7/85  mediastinal]
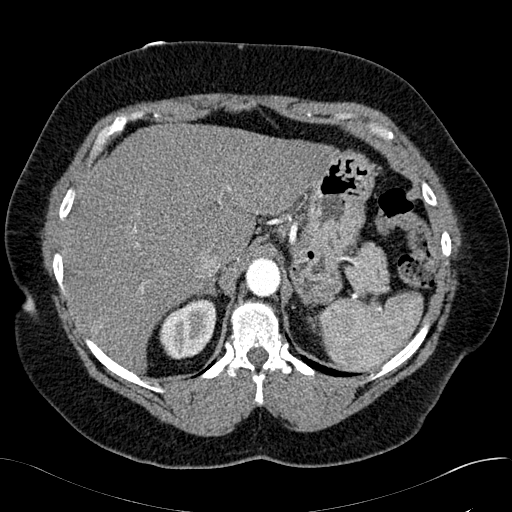
[im 7/85  lung]
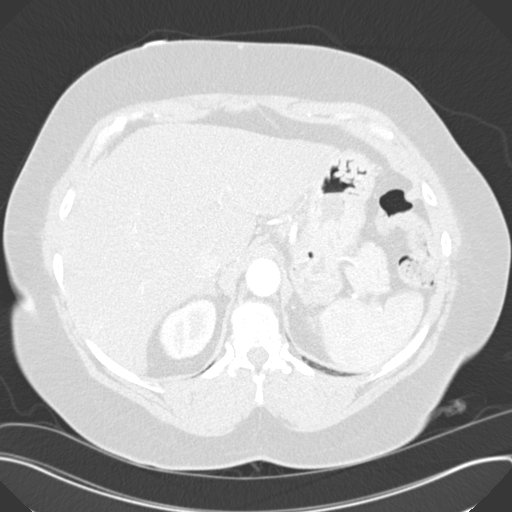
[im 13/85  lung]
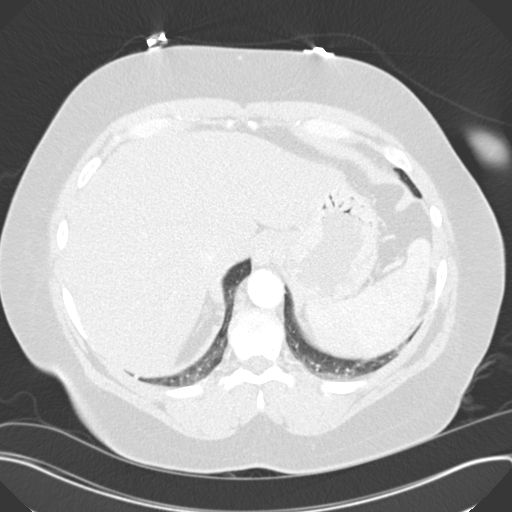
[im 17/85  lung]
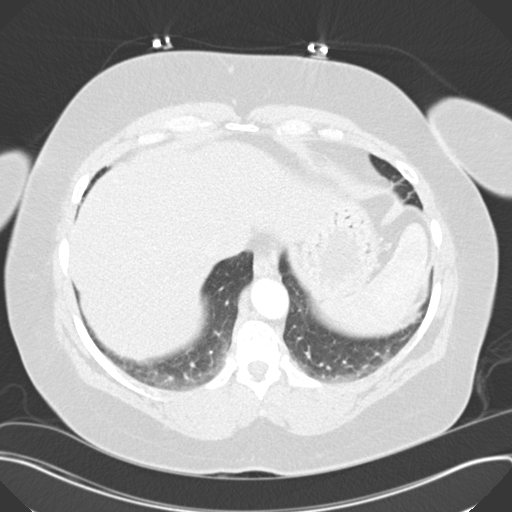
[im 22/85  lung]
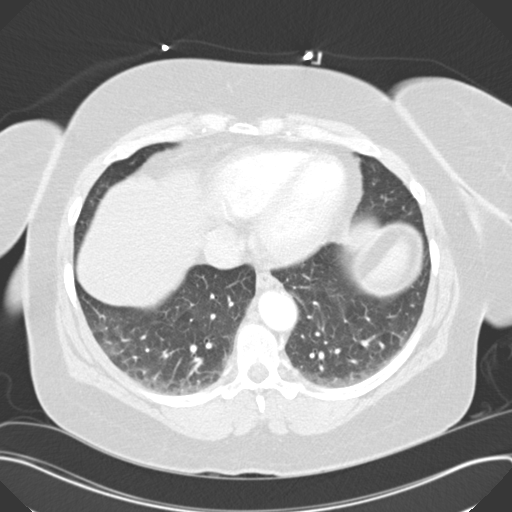
[im 29/85  mediastinal]
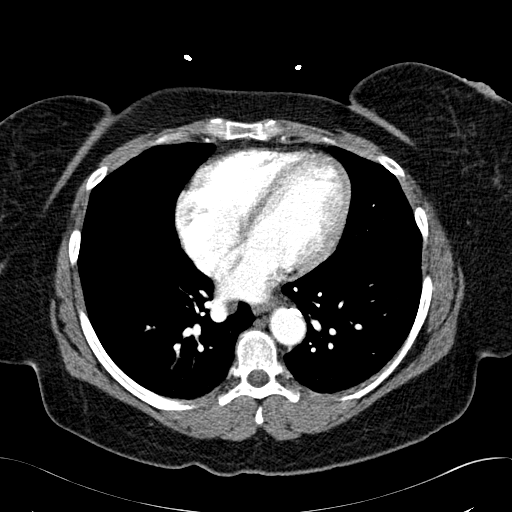
[im 29/85  lung]
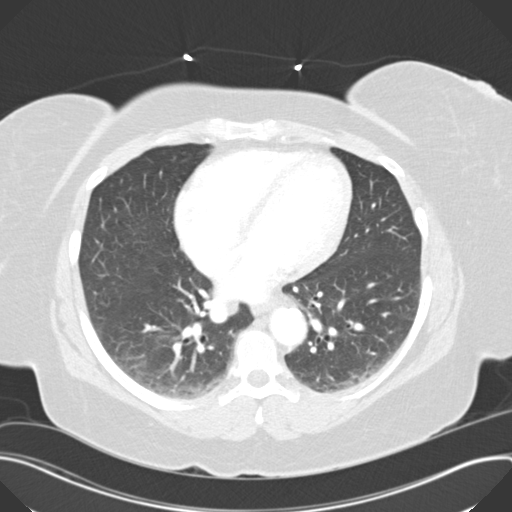
[im 34/85  lung]
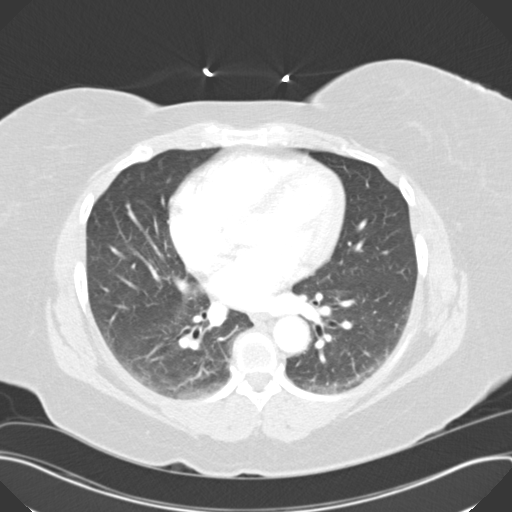
[im 38/85  lung]
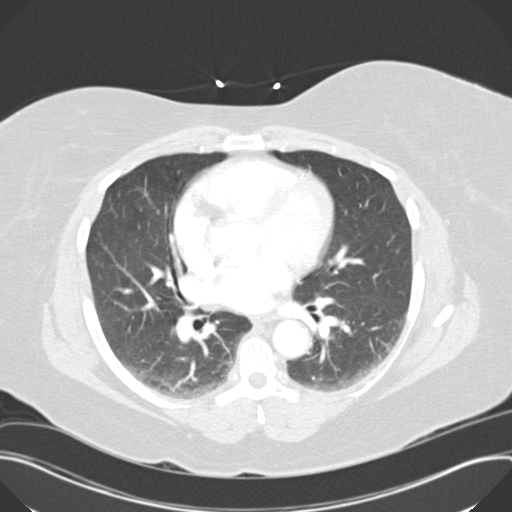
[im 41/85  lung]
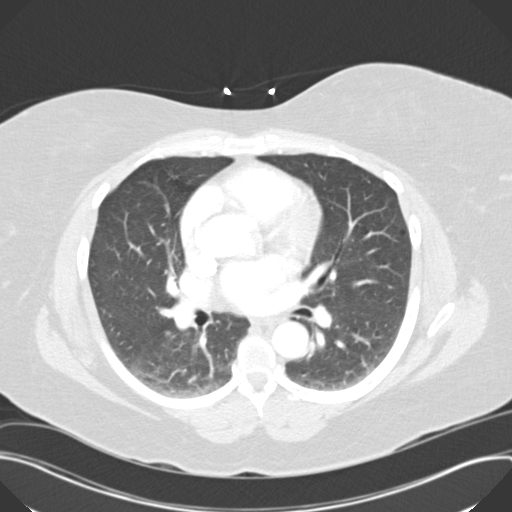
[im 46/85  mediastinal]
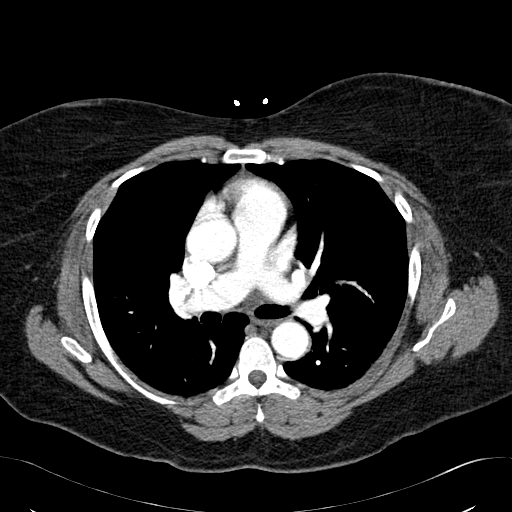
[im 46/85  lung]
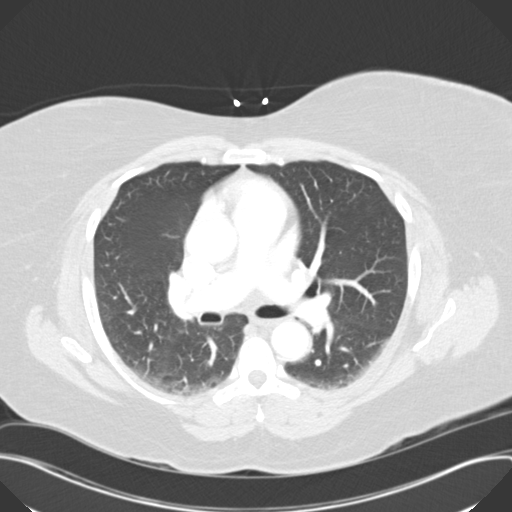
[im 50/85  lung]
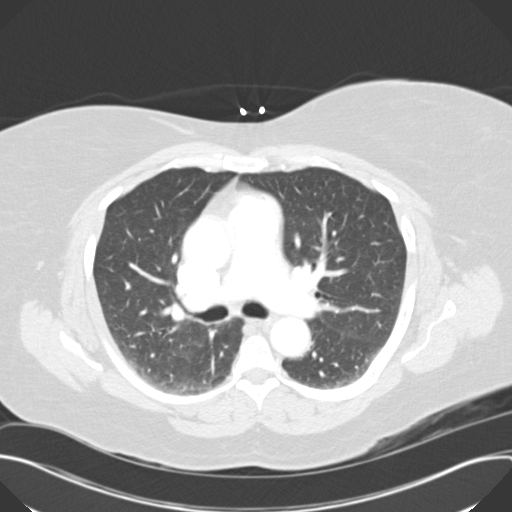
[im 53/85  lung]
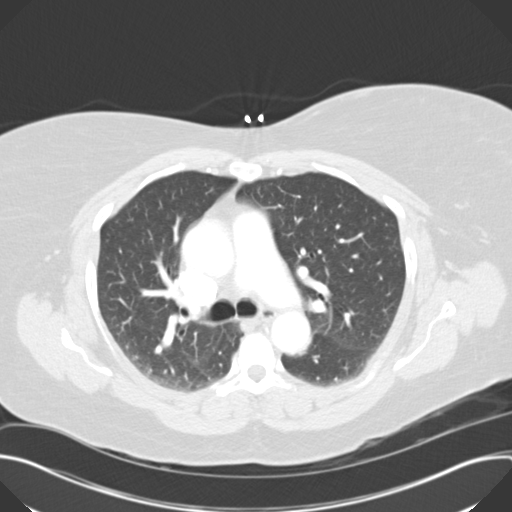
[im 60/85  lung]
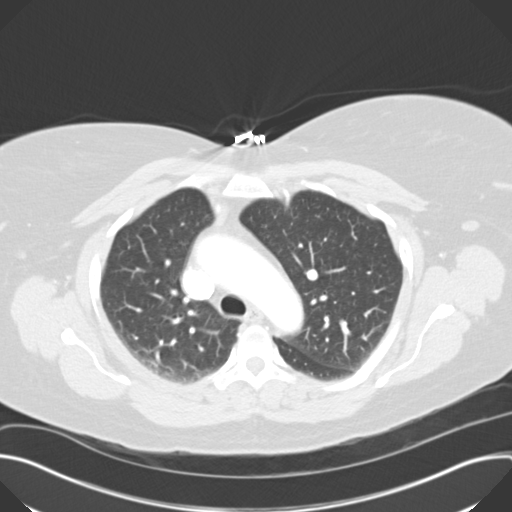
[im 66/85  mediastinal]
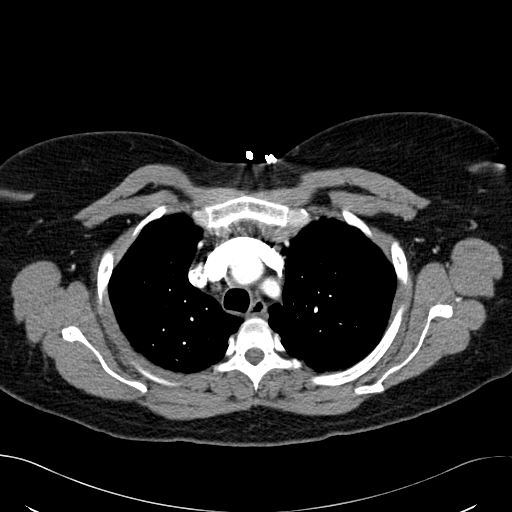
[im 66/85  lung]
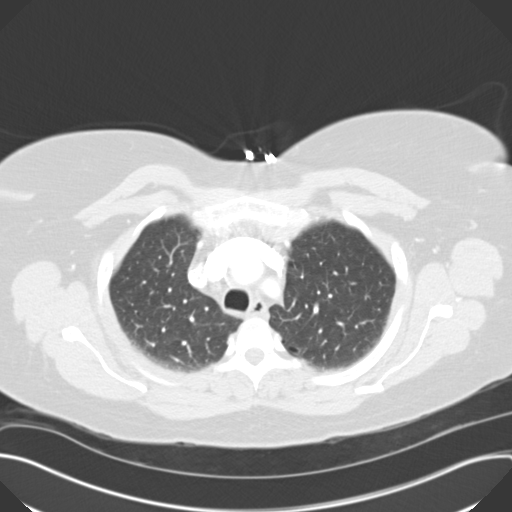
[im 69/85  lung]
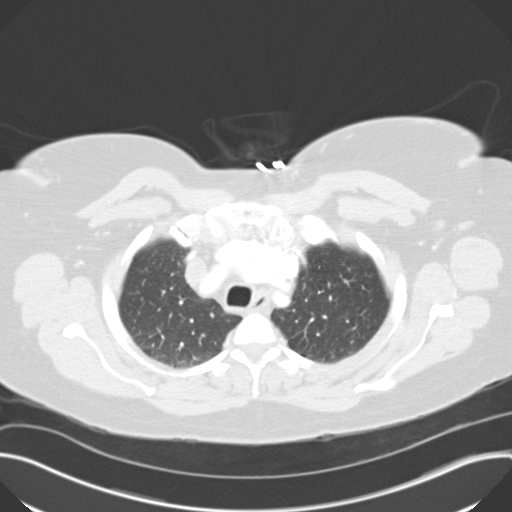
[im 75/85  lung]
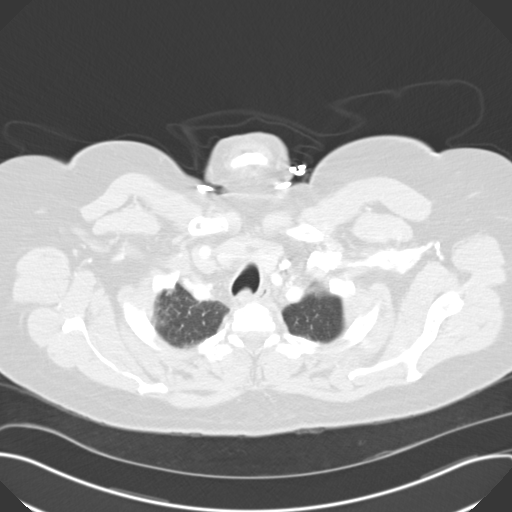
[im 81/85  lung]
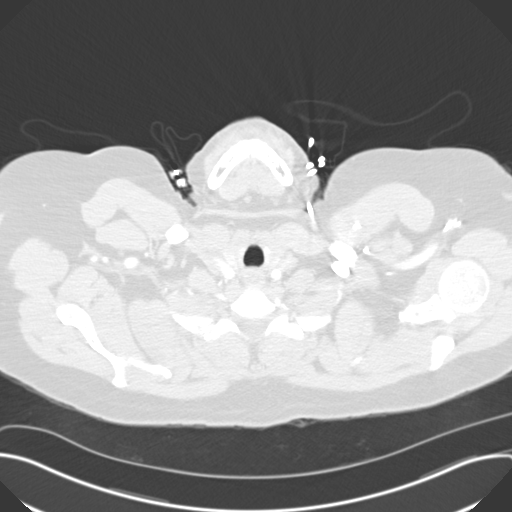

[16 of 34 positions shown; findings below may reference images not displayed]

FINDINGS: Evaluation of the mediastinum and hilar regions and structures
demonstrates no evidence of mediastinal or hilar adenopathy or masses. The
lung parenchyma demonstrates mild hypoventilation within the lung bases and
otherwise unremarkable. There is no evidence of filling defects within the
main lobar or segmental pulmonary arteries. The visualized upper abdominal
viscera demonstrate a stable mass involving the left adrenal and otherwise
unremarkable.
IMPRESSION: 1.  No CT evidence of pulmonary arterial embolic disease.
2.  Stable, left adrenal.
3.  No further focal or acute intrathoracic abnormalities.

## 2011-11-03 ENCOUNTER — Inpatient Hospital Stay: Payer: Self-pay | Admitting: Specialist

## 2011-11-03 LAB — URINALYSIS, COMPLETE
Blood: NEGATIVE
Glucose,UR: NEGATIVE mg/dL (ref 0–75)
Leukocyte Esterase: NEGATIVE
Nitrite: NEGATIVE
Ph: 5 (ref 4.5–8.0)
Protein: 100
RBC,UR: 2 /HPF (ref 0–5)
Specific Gravity: 1.024 (ref 1.003–1.030)
Squamous Epithelial: 16

## 2011-11-03 LAB — CBC
HGB: 13.6 g/dL (ref 12.0–16.0)
MCH: 32.9 pg (ref 26.0–34.0)
MCHC: 32.4 g/dL (ref 32.0–36.0)
RDW: 13.7 % (ref 11.5–14.5)
WBC: 6.4 10*3/uL (ref 3.6–11.0)

## 2011-11-03 LAB — DRUG SCREEN, URINE
Amphetamines, Ur Screen: NEGATIVE (ref ?–1000)
Barbiturates, Ur Screen: NEGATIVE (ref ?–200)
Benzodiazepine, Ur Scrn: NEGATIVE (ref ?–200)
Cannabinoid 50 Ng, Ur ~~LOC~~: NEGATIVE (ref ?–50)
MDMA (Ecstasy)Ur Screen: NEGATIVE (ref ?–500)
Methadone, Ur Screen: NEGATIVE (ref ?–300)
Opiate, Ur Screen: POSITIVE (ref ?–300)
Tricyclic, Ur Screen: POSITIVE (ref ?–1000)

## 2011-11-03 LAB — ETHANOL: Ethanol %: <0.003 % (ref 0.000–0.080)

## 2011-11-03 LAB — COMPREHENSIVE METABOLIC PANEL
Albumin: 3.5 g/dL (ref 3.4–5.0)
Alkaline Phosphatase: 159 U/L — ABNORMAL HIGH (ref 50–136)
Anion Gap: 12 (ref 7–16)
Chloride: 103 mmol/L (ref 98–107)
EGFR (African American): >60
EGFR (Non-African Amer.): >60
Glucose: 114 mg/dL — ABNORMAL HIGH (ref 65–99)
Osmolality: 275 (ref 275–301)
Potassium: 3.6 mmol/L (ref 3.5–5.1)
SGOT(AST): 189 U/L — ABNORMAL HIGH (ref 15–37)
SGPT (ALT): 133 U/L — ABNORMAL HIGH
Sodium: 138 mmol/L (ref 136–145)
Total Protein: 8.7 g/dL — ABNORMAL HIGH (ref 6.4–8.2)

## 2011-11-03 LAB — PROTIME-INR
INR: 1.1
Prothrombin Time: 14.9 secs — ABNORMAL HIGH (ref 11.5–14.7)

## 2011-11-03 LAB — LIPASE, BLOOD: Lipase: 1330 U/L — ABNORMAL HIGH (ref 73–393)

## 2011-11-04 LAB — CBC WITH DIFFERENTIAL/PLATELET
Basophil #: 0 10*3/uL (ref 0.0–0.1)
Basophil %: 0.6 %
Eosinophil #: 0.1 10*3/uL (ref 0.0–0.7)
HGB: 12.3 g/dL (ref 12.0–16.0)
Lymphocyte #: 1.9 10*3/uL (ref 1.0–3.6)
Lymphocyte %: 36.1 %
MCHC: 33.1 g/dL (ref 32.0–36.0)
MCV: 102 fL — ABNORMAL HIGH (ref 80–100)
Monocyte #: 1.1 x10 3/mm — ABNORMAL HIGH (ref 0.2–0.9)
Monocyte %: 19.7 %
Neutrophil #: 2.2 10*3/uL (ref 1.4–6.5)
Neutrophil %: 41.2 %
Platelet: 140 10*3/uL — ABNORMAL LOW (ref 150–440)
RDW: 13.7 % (ref 11.5–14.5)
WBC: 5.4 10*3/uL (ref 3.6–11.0)

## 2011-11-04 LAB — MAGNESIUM: Magnesium: 1.9 mg/dL

## 2011-11-04 LAB — LIPID PANEL
HDL Cholesterol: 22 mg/dL — ABNORMAL LOW (ref 40–60)
Triglycerides: 113 mg/dL (ref 0–200)

## 2011-11-04 LAB — LIPASE, BLOOD: Lipase: 2271 U/L — ABNORMAL HIGH (ref 73–393)

## 2011-11-05 LAB — CBC WITH DIFFERENTIAL/PLATELET
Basophil %: 0.6 %
Eosinophil #: 0.1 10*3/uL (ref 0.0–0.7)
HCT: 34.9 % — ABNORMAL LOW (ref 35.0–47.0)
HGB: 11.4 g/dL — ABNORMAL LOW (ref 12.0–16.0)
MCH: 33.3 pg (ref 26.0–34.0)
MCHC: 32.7 g/dL (ref 32.0–36.0)
MCV: 102 fL — ABNORMAL HIGH (ref 80–100)
Neutrophil %: 46.1 %
Platelet: 113 10*3/uL — ABNORMAL LOW (ref 150–440)
RBC: 3.42 10*6/uL — ABNORMAL LOW (ref 3.80–5.20)
RDW: 13.3 % (ref 11.5–14.5)
WBC: 4.4 10*3/uL (ref 3.6–11.0)

## 2011-11-05 LAB — LIPASE, BLOOD: Lipase: 2033 U/L — ABNORMAL HIGH (ref 73–393)

## 2011-11-05 LAB — URINE CULTURE

## 2011-11-05 LAB — COMPREHENSIVE METABOLIC PANEL
Albumin: 2.6 g/dL — ABNORMAL LOW (ref 3.4–5.0)
Alkaline Phosphatase: 117 U/L (ref 50–136)
Calcium, Total: 8.1 mg/dL — ABNORMAL LOW (ref 8.5–10.1)
Chloride: 109 mmol/L — ABNORMAL HIGH (ref 98–107)
Creatinine: 0.8 mg/dL (ref 0.60–1.30)
Osmolality: 276 (ref 275–301)
Potassium: 3.5 mmol/L (ref 3.5–5.1)
SGOT(AST): 161 U/L — ABNORMAL HIGH (ref 15–37)
SGPT (ALT): 100 U/L — ABNORMAL HIGH
Total Protein: 6.6 g/dL (ref 6.4–8.2)

## 2011-11-05 LAB — CLOSTRIDIUM DIFFICILE BY PCR

## 2011-11-06 LAB — BASIC METABOLIC PANEL
Anion Gap: 7 (ref 7–16)
BUN: 5 mg/dL — ABNORMAL LOW (ref 7–18)
Creatinine: 0.79 mg/dL (ref 0.60–1.30)
EGFR (African American): >60
EGFR (Non-African Amer.): >60
Glucose: 112 mg/dL — ABNORMAL HIGH (ref 65–99)
Osmolality: 279 (ref 275–301)
Potassium: 3.8 mmol/L (ref 3.5–5.1)
Sodium: 141 mmol/L (ref 136–145)

## 2011-11-07 LAB — LIPASE, BLOOD: Lipase: 860 U/L — ABNORMAL HIGH (ref 73–393)

## 2011-11-07 LAB — STOOL CULTURE

## 2011-12-01 IMAGING — CT CT CHEST W/ CM
1 series · 16 of 33 positions shown, 20 images · IV contrast (APPLIED)
Comparison: none

REASON FOR EXAM: hemoptysis, hx of hep C, hx of brain tumor
COMMENTS:

PROCEDURE:     CT  - CT CHEST WITH CONTRAST  - December 21, 2009 [DATE]
RESULT:     History: Hemoptysis.
Comparison Study: Chest CT of 11/15/2009.

[Series 4: soft tissue · axial · 0.76mm/px · z∈[+696,+936]mm · 16 of 88 slices shown, 20 images]
[im 4/88  mediastinal]
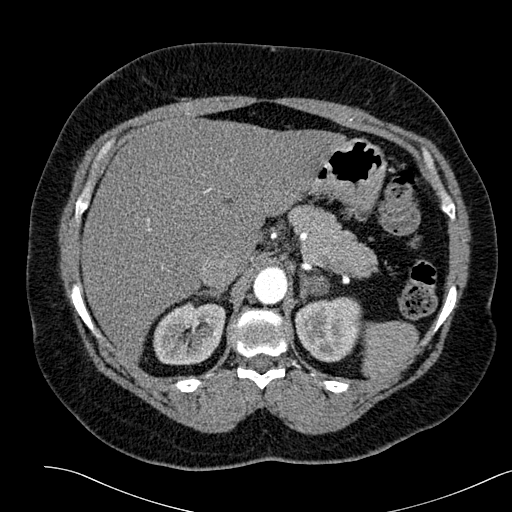
[im 4/88  lung]
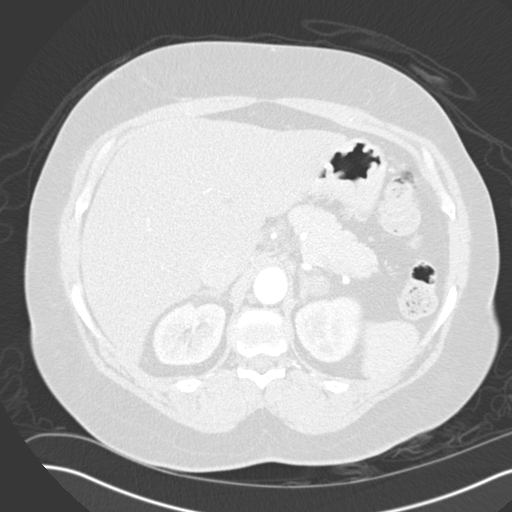
[im 10/88  lung]
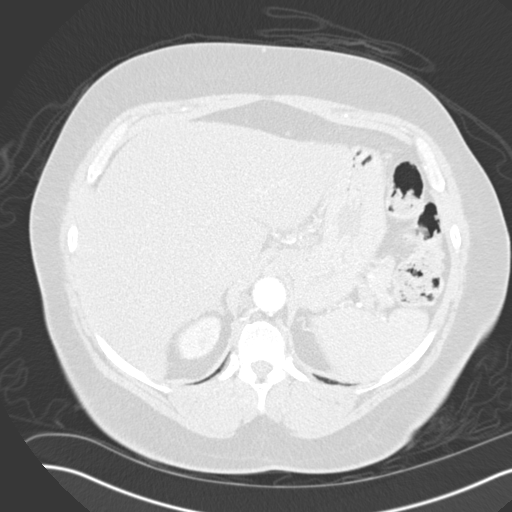
[im 17/88  lung]
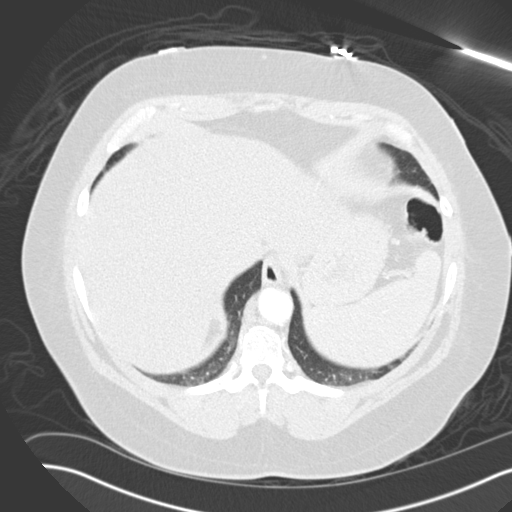
[im 20/88  lung]
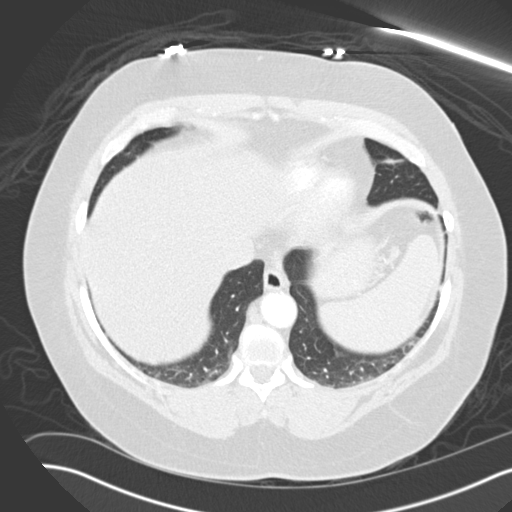
[im 26/88  mediastinal]
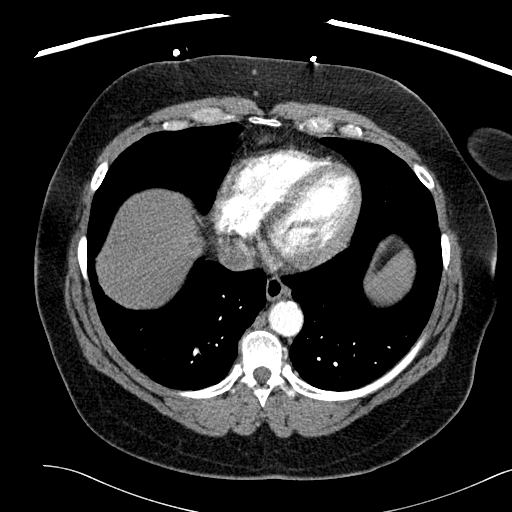
[im 26/88  lung]
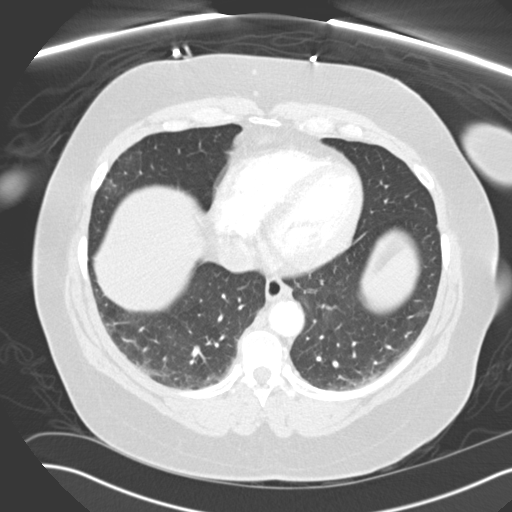
[im 33/88  lung]
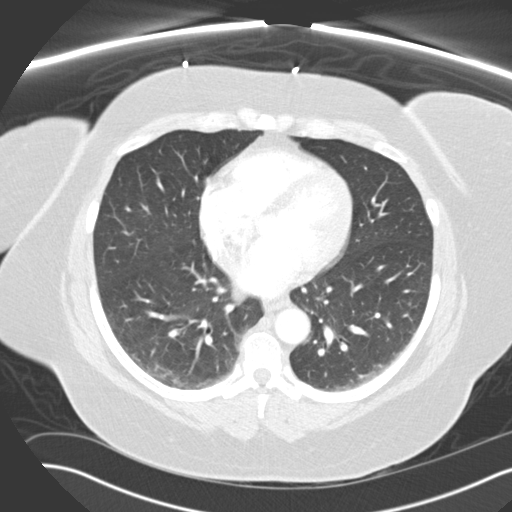
[im 36/88  lung]
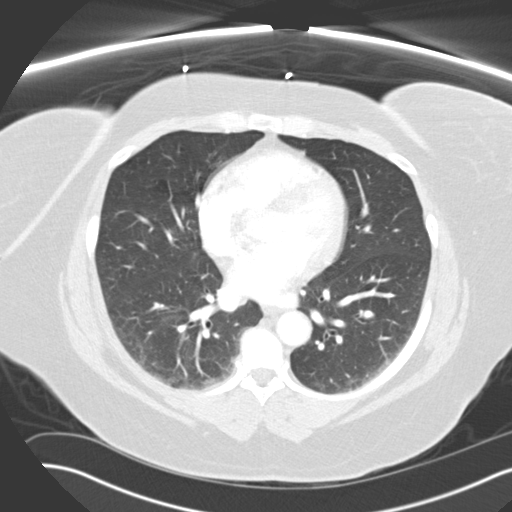
[im 42/88  lung]
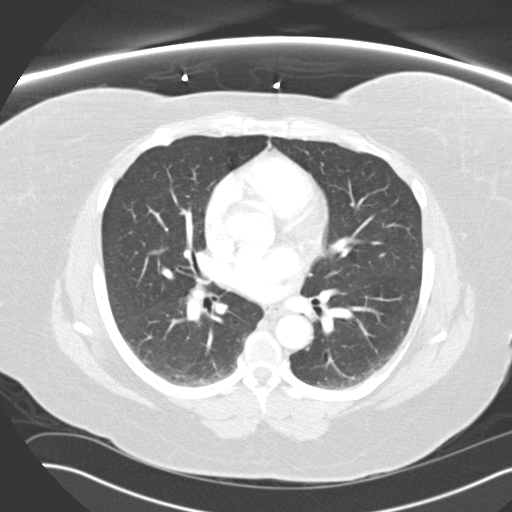
[im 47/88  mediastinal]
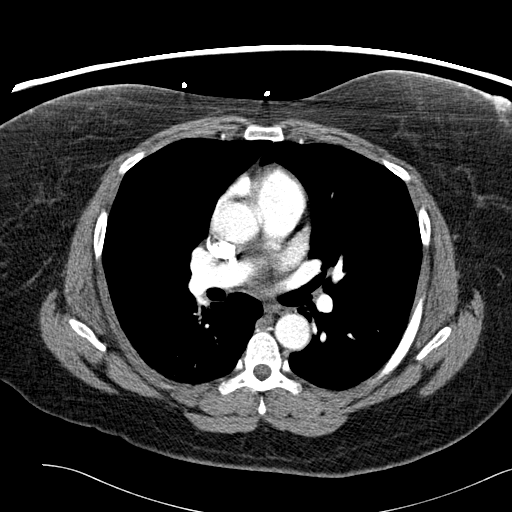
[im 47/88  lung]
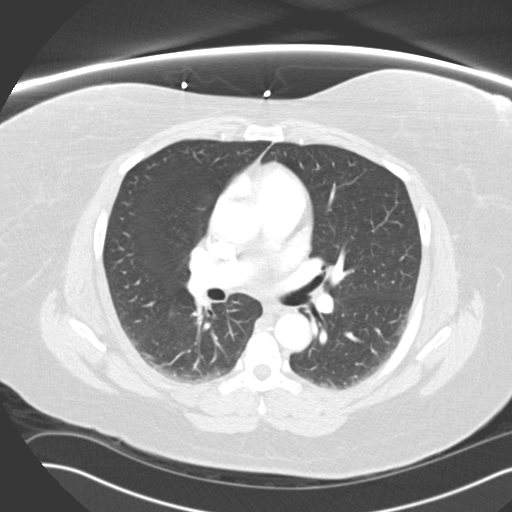
[im 52/88  lung]
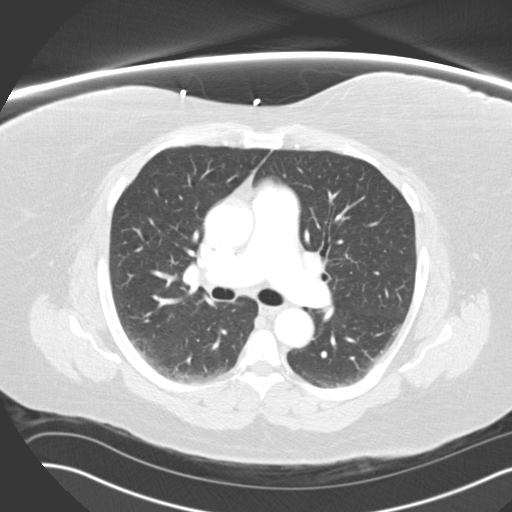
[im 55/88  lung]
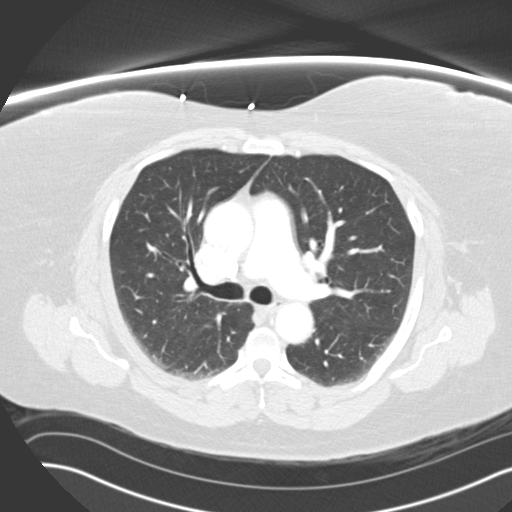
[im 62/88  lung]
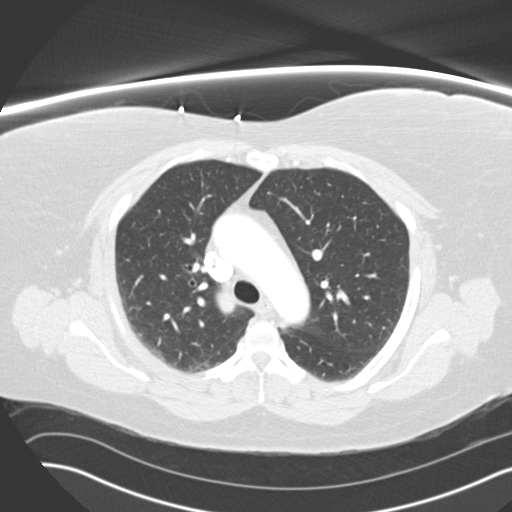
[im 68/88  mediastinal]
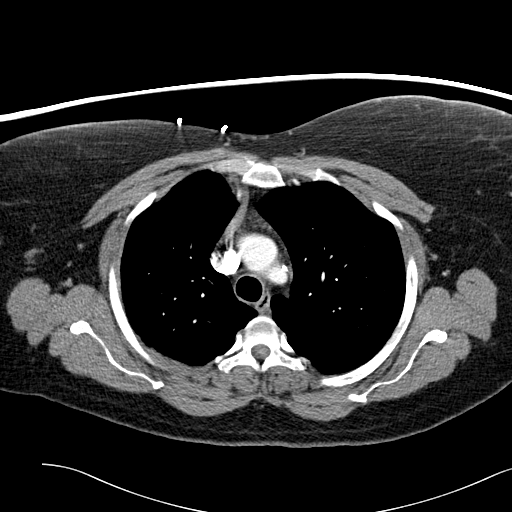
[im 68/88  lung]
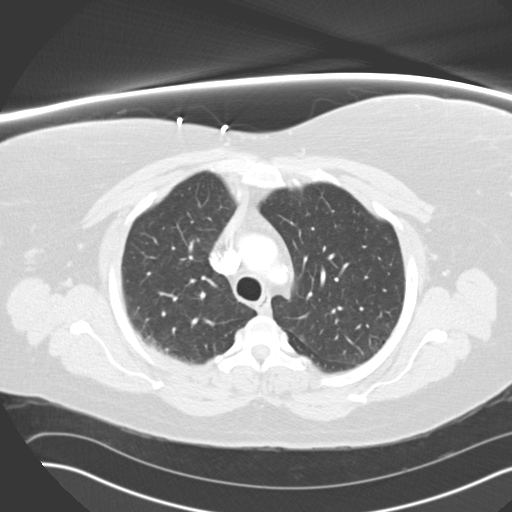
[im 71/88  lung]
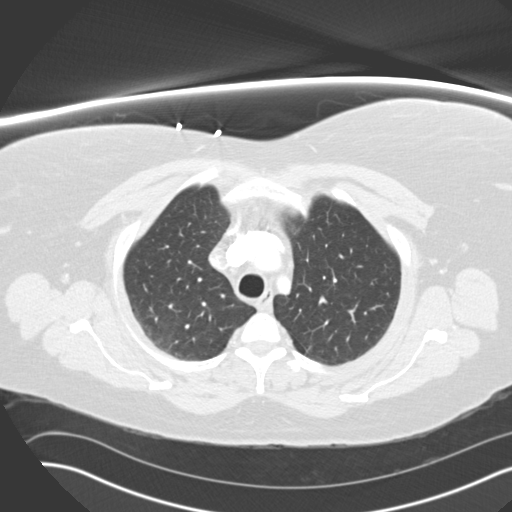
[im 78/88  lung]
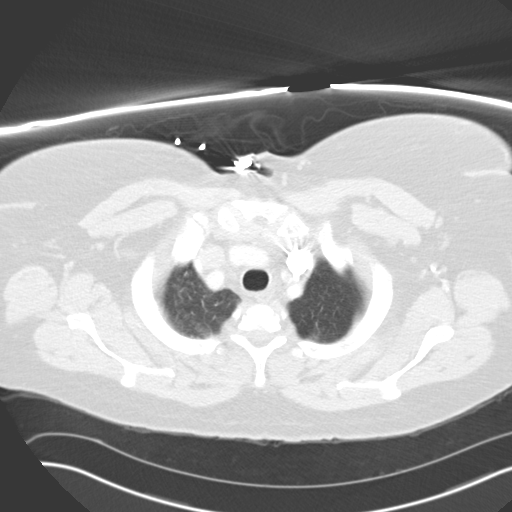
[im 84/88  lung]
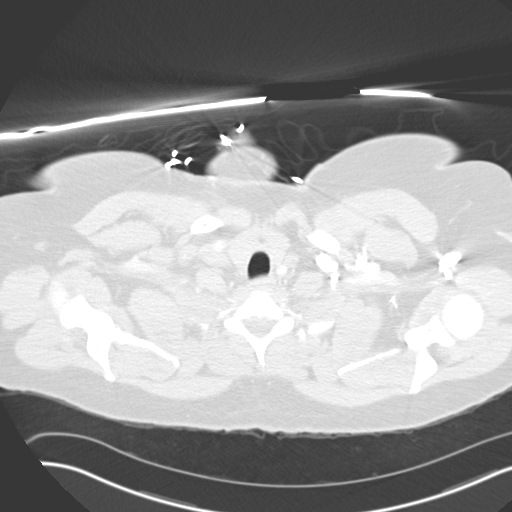

[16 of 33 positions shown; findings below may reference images not displayed]

FINDINGS: Following administration of 85 cc of Hsovue-QES, CT obtained.
Pulmonary arteries normal. Coronary arteries are unremarkable. Heart size is
normal. Left adrenal fullness to 2.2 cm consistent with left adrenal
adenoma. No focal poor infiltrates noted. Lung CAD is negative. Mild
atelectasis present.
IMPRESSION: No acute abnormality.

## 2011-12-01 IMAGING — CT CT HEAD WITHOUT CONTRAST
1 series · 16 of 29 positions shown, 20 images · non-contrast
Comparison: none

REASON FOR EXAM: headache, hx of tumor, resection, grew back
COMMENTS:

PROCEDURE:     CT  - CT HEAD WITHOUT CONTRAST  - December 21, 2009  [DATE]
RESULT:     History: Headache.
Comparison Study: CT of 09/14/2009.

[Series 2: soft tissue · axial · 0.39mm/px · z∈[+878,+1008]mm · 16 of 29 slices shown, 20 images]
[im 2/29  brain]
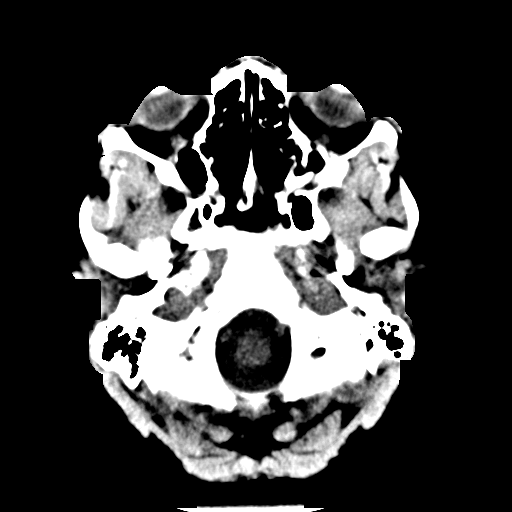
[im 2/29  bone]
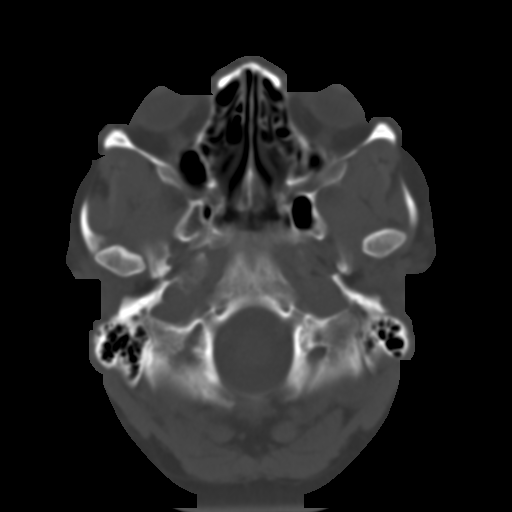
[im 4/29  brain]
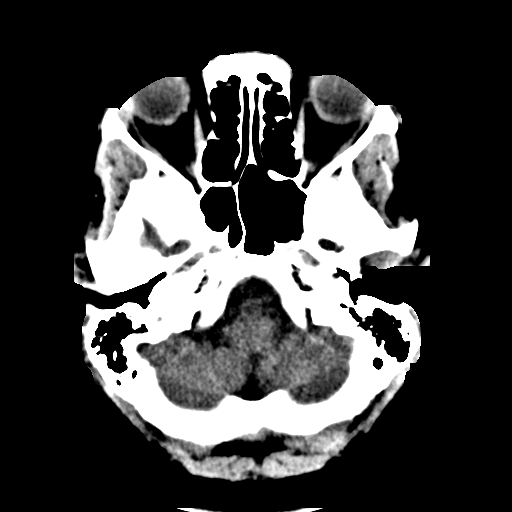
[im 6/29  brain]
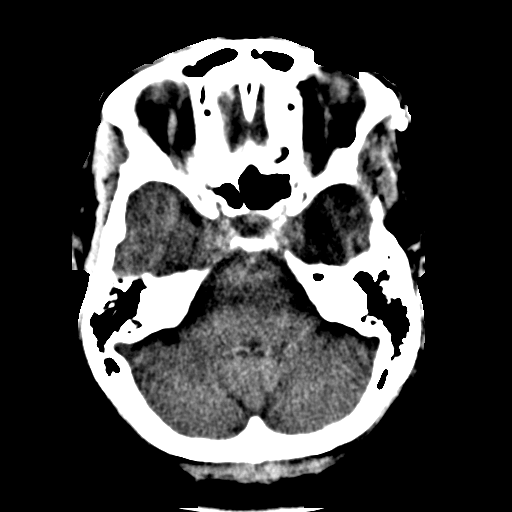
[im 7/29  brain]
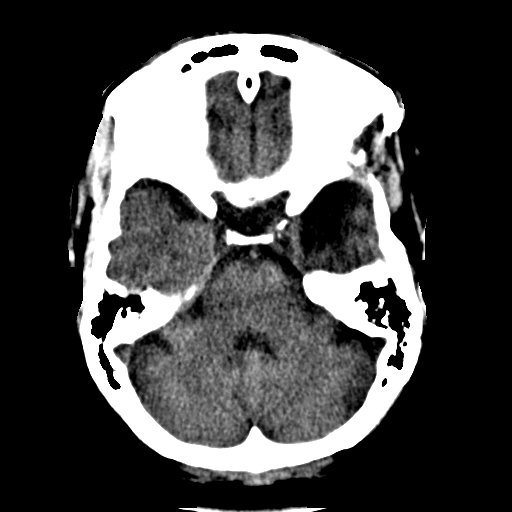
[im 9/29  brain]
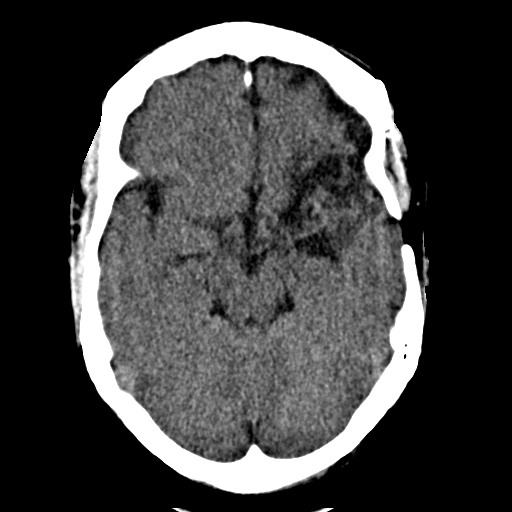
[im 9/29  bone]
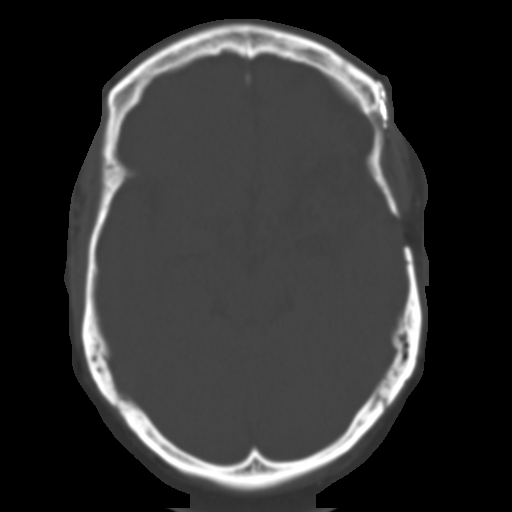
[im 11/29  brain]
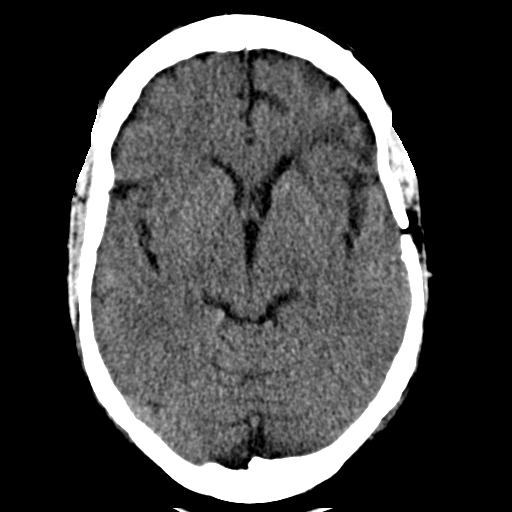
[im 12/29  brain]
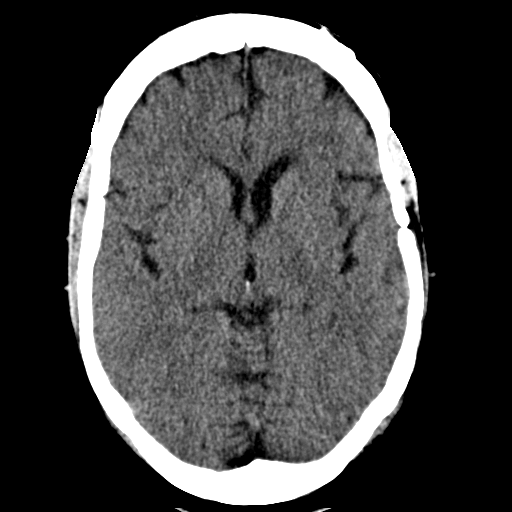
[im 14/29  brain]
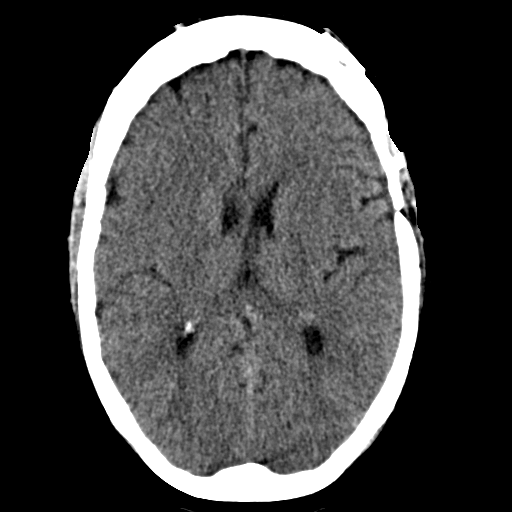
[im 16/29  brain]
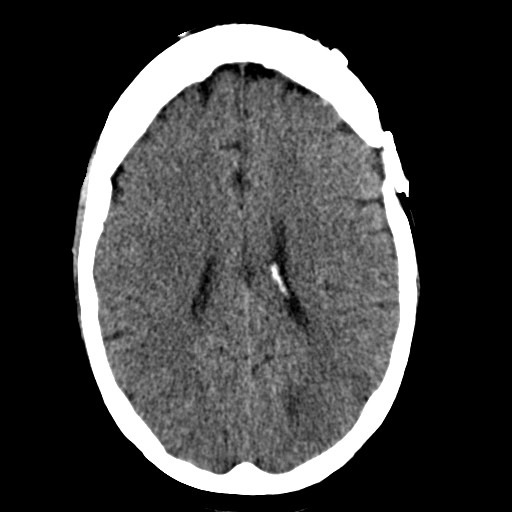
[im 16/29  bone]
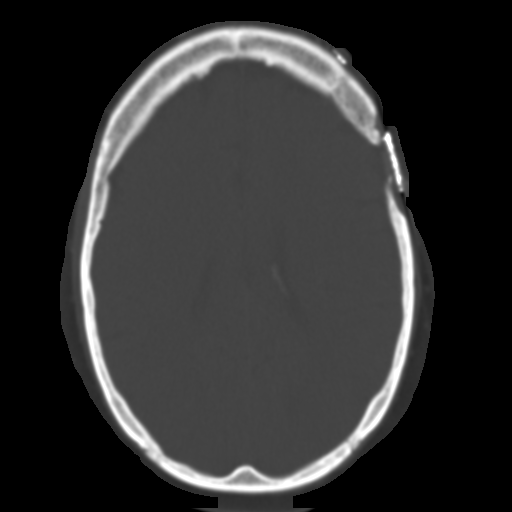
[im 18/29  brain]
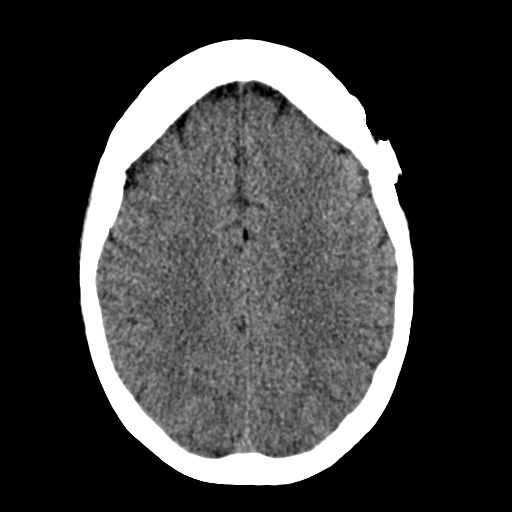
[im 19/29  brain]
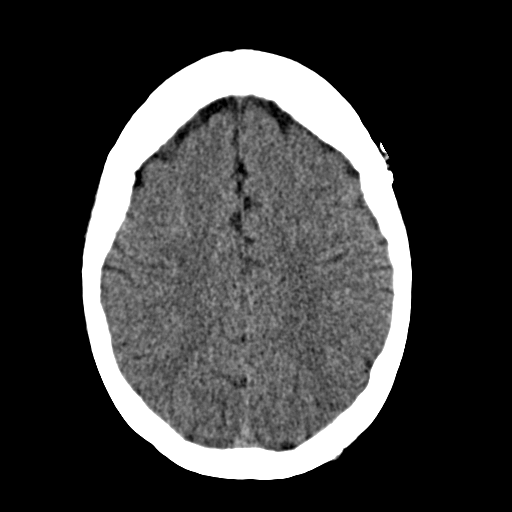
[im 21/29  brain]
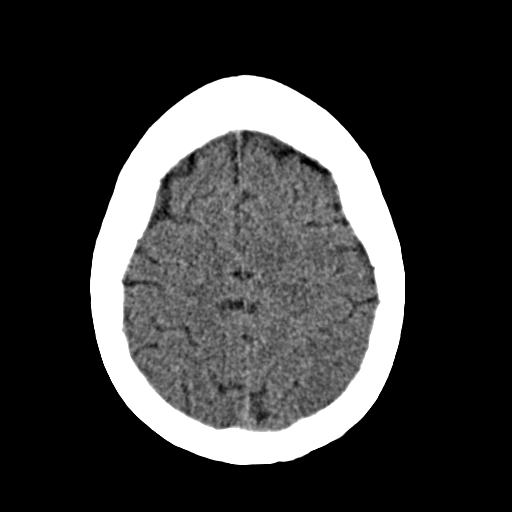
[im 23/29  brain]
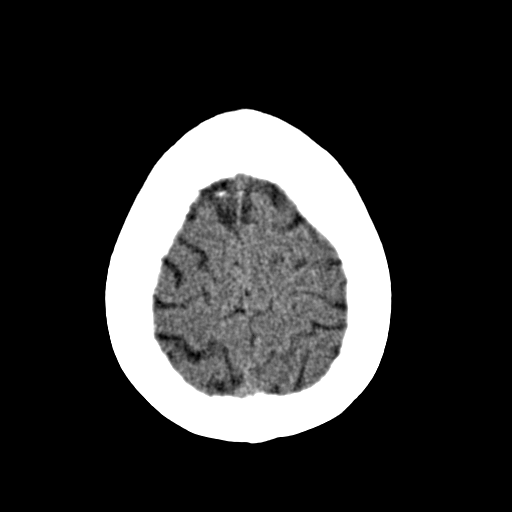
[im 23/29  bone]
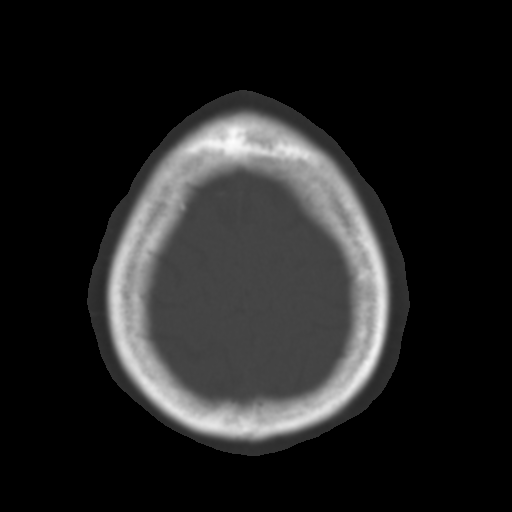
[im 24/29  brain]
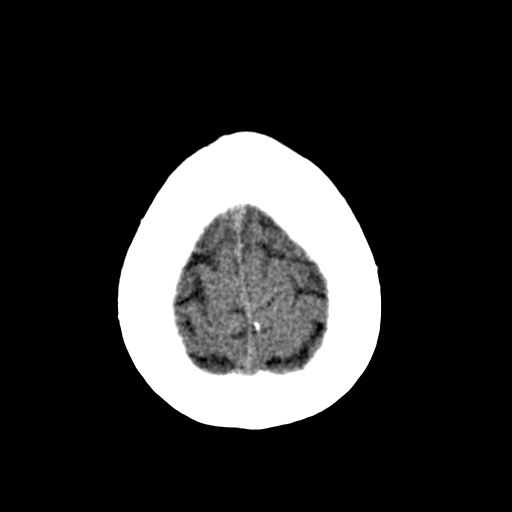
[im 26/29  brain]
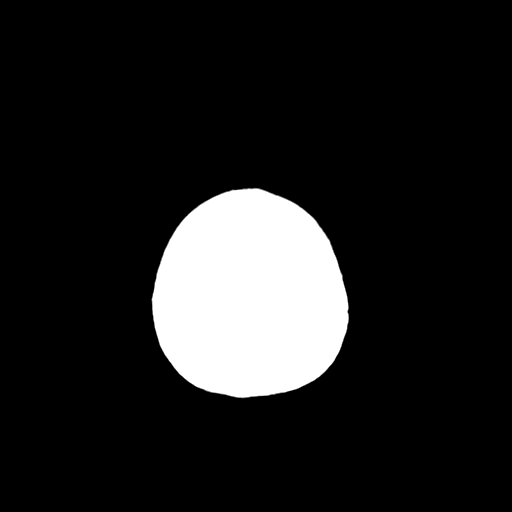
[im 28/29  brain]
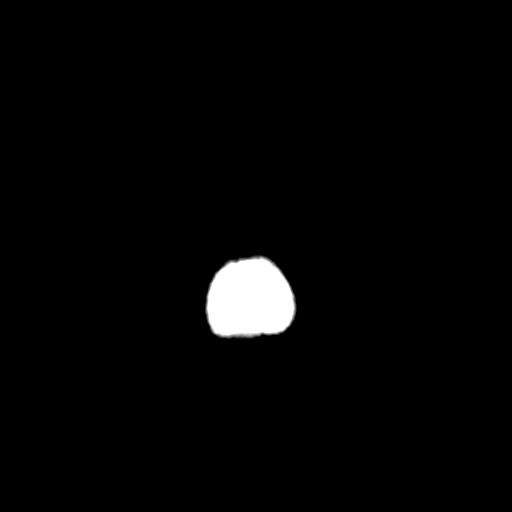

[16 of 29 positions shown; findings below may reference images not displayed]

FINDINGS: Left frontal left temporal atrophy is present. No mass lesion
noted. No hemorrhage noted. No acute bony abnormality identified. Patient's
head a prior craniotomy. Lucency noted in the left posterior parietal
occipital region; this may represent a site of stroke. Given the patient's
history of tumor MRI may prove useful.
IMPRESSION: 1. Encephalomalacia left middle cranial fossa. This is consistent is prior
surgery this region.
2. Lucency noted in the left posterior parietal-occipital region. This is
faint. Given the patient's history of tumor MRI or contrast enhanced CT may
prove useful.

## 2011-12-01 IMAGING — CR DG CHEST 1V PORT
1 series · 1 of 1 positions shown · non-contrast
Comparison: none

REASON FOR EXAM: cough, hemoptysis
COMMENTS:

PROCEDURE:     DXR - DXR PORTABLE CHEST SINGLE VIEW  - December 21, 2009  [DATE]
RESULT:     Comparison is made to the study 15 November, 2009.
The lungs are well-expanded. There is no focal infiltrate. The cardiac
silhouette is normal in size. The pulmonary vascularity is not engorged.

[view not recorded]
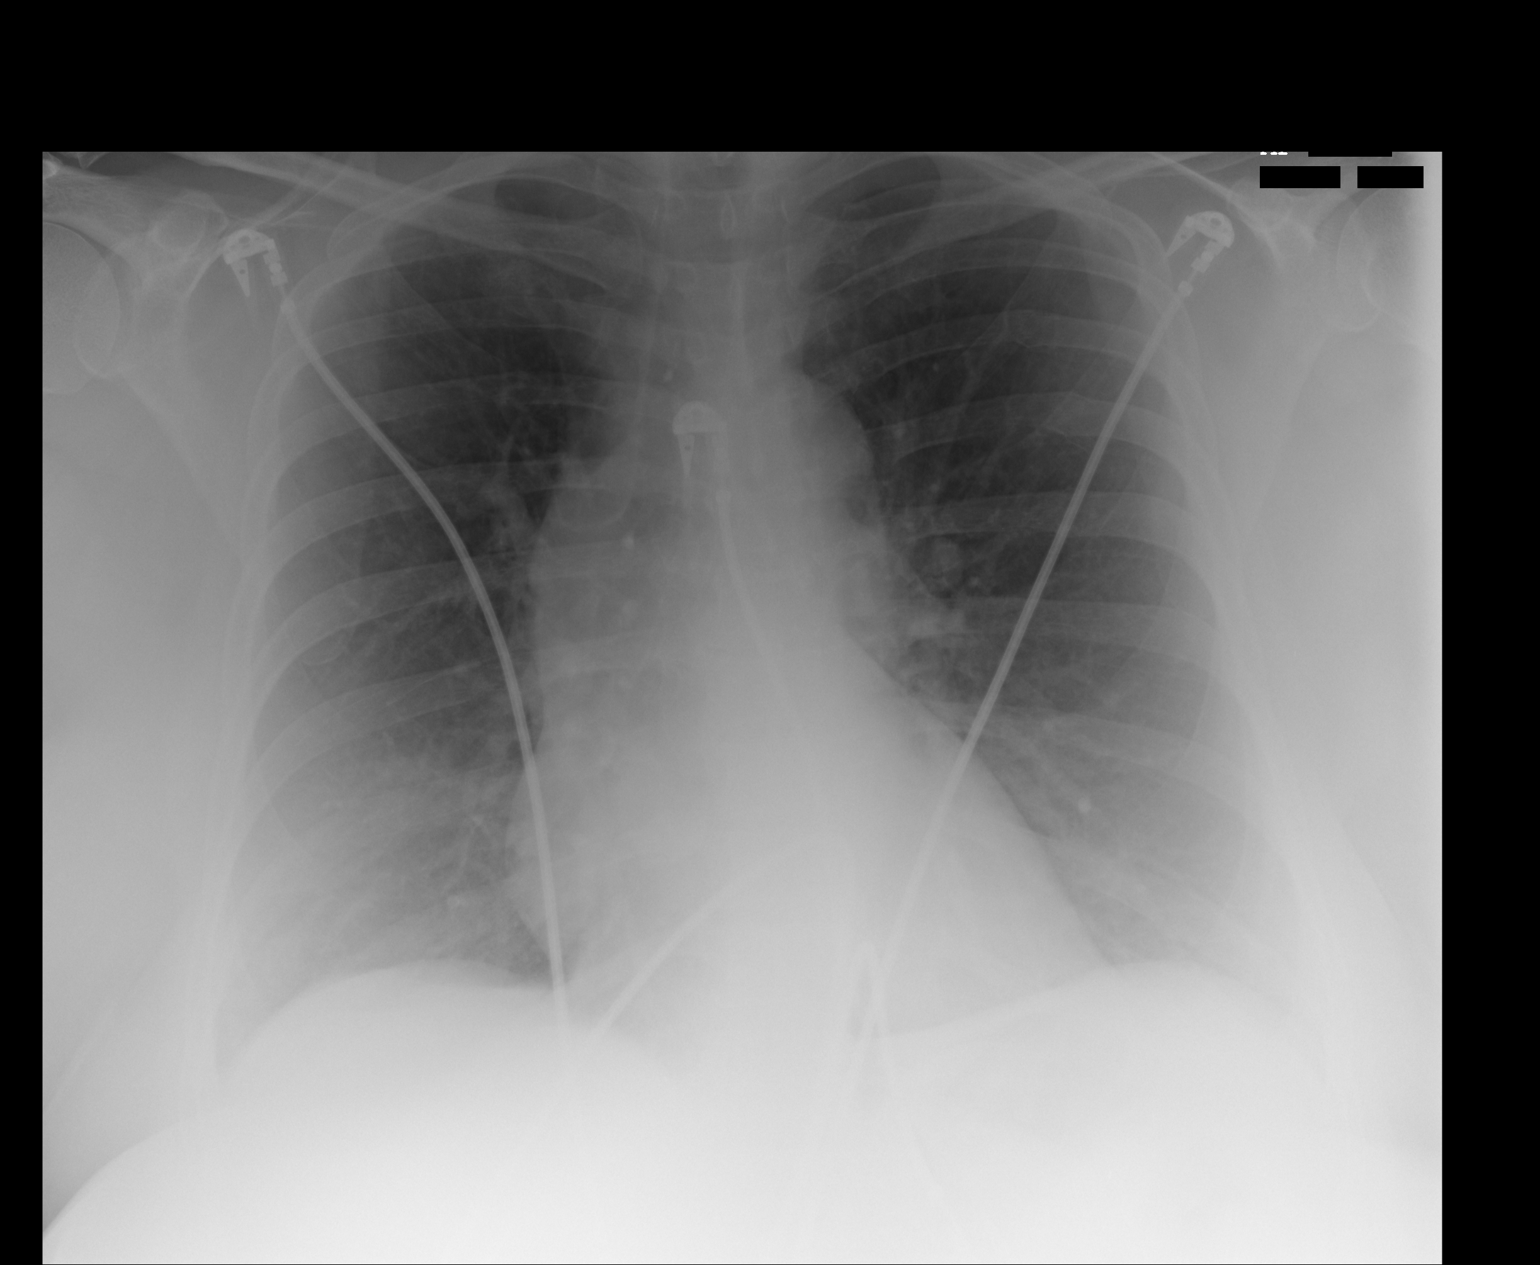

[1 of 1 positions shown; findings below may reference images not displayed]

IMPRESSION: I do not see evidence of CHF nor of pneumonia. There is
mild hyperinflation which is not a new finding. This may be voluntary or
could reflect underlying COPD.

## 2011-12-10 ENCOUNTER — Emergency Department: Payer: Self-pay | Admitting: Emergency Medicine

## 2011-12-10 LAB — URINALYSIS, COMPLETE
Glucose,UR: NEGATIVE mg/dL (ref 0–75)
Ph: 6 (ref 4.5–8.0)
Protein: NEGATIVE
WBC UR: 4 /HPF (ref 0–5)

## 2011-12-10 LAB — COMPREHENSIVE METABOLIC PANEL
Alkaline Phosphatase: 223 U/L — ABNORMAL HIGH (ref 50–136)
BUN: 5 mg/dL — ABNORMAL LOW (ref 7–18)
Calcium, Total: 8.9 mg/dL (ref 8.5–10.1)
Co2: 25 mmol/L (ref 21–32)
Creatinine: 0.92 mg/dL (ref 0.60–1.30)
EGFR (African American): >60
EGFR (Non-African Amer.): >60
SGOT(AST): 70 U/L — ABNORMAL HIGH (ref 15–37)
SGPT (ALT): 44 U/L
Sodium: 138 mmol/L (ref 136–145)
Total Protein: 9.2 g/dL — ABNORMAL HIGH (ref 6.4–8.2)

## 2011-12-10 LAB — CBC WITH DIFFERENTIAL/PLATELET
Basophil #: 0.1 10*3/uL (ref 0.0–0.1)
Eosinophil %: 3.2 %
HCT: 41.4 % (ref 35.0–47.0)
HGB: 13.8 g/dL (ref 12.0–16.0)
Lymphocyte #: 3.7 10*3/uL — ABNORMAL HIGH (ref 1.0–3.6)
Monocyte #: 1 x10 3/mm — ABNORMAL HIGH (ref 0.2–0.9)
Monocyte %: 10.3 %
Neutrophil #: 4.8 10*3/uL (ref 1.4–6.5)
Neutrophil %: 48.2 %
RBC: 4.17 10*6/uL (ref 3.80–5.20)
RDW: 13.5 % (ref 11.5–14.5)
WBC: 9.9 10*3/uL (ref 3.6–11.0)

## 2011-12-12 IMAGING — CR DG CHEST 2V
1 series · 2 of 2 positions shown · non-contrast
Comparison: none

REASON FOR EXAM: fever, cough
COMMENTS:

[Series 1: view not recorded · 0.17mm/px · 2 of 2 slices shown]
[im 1/2]
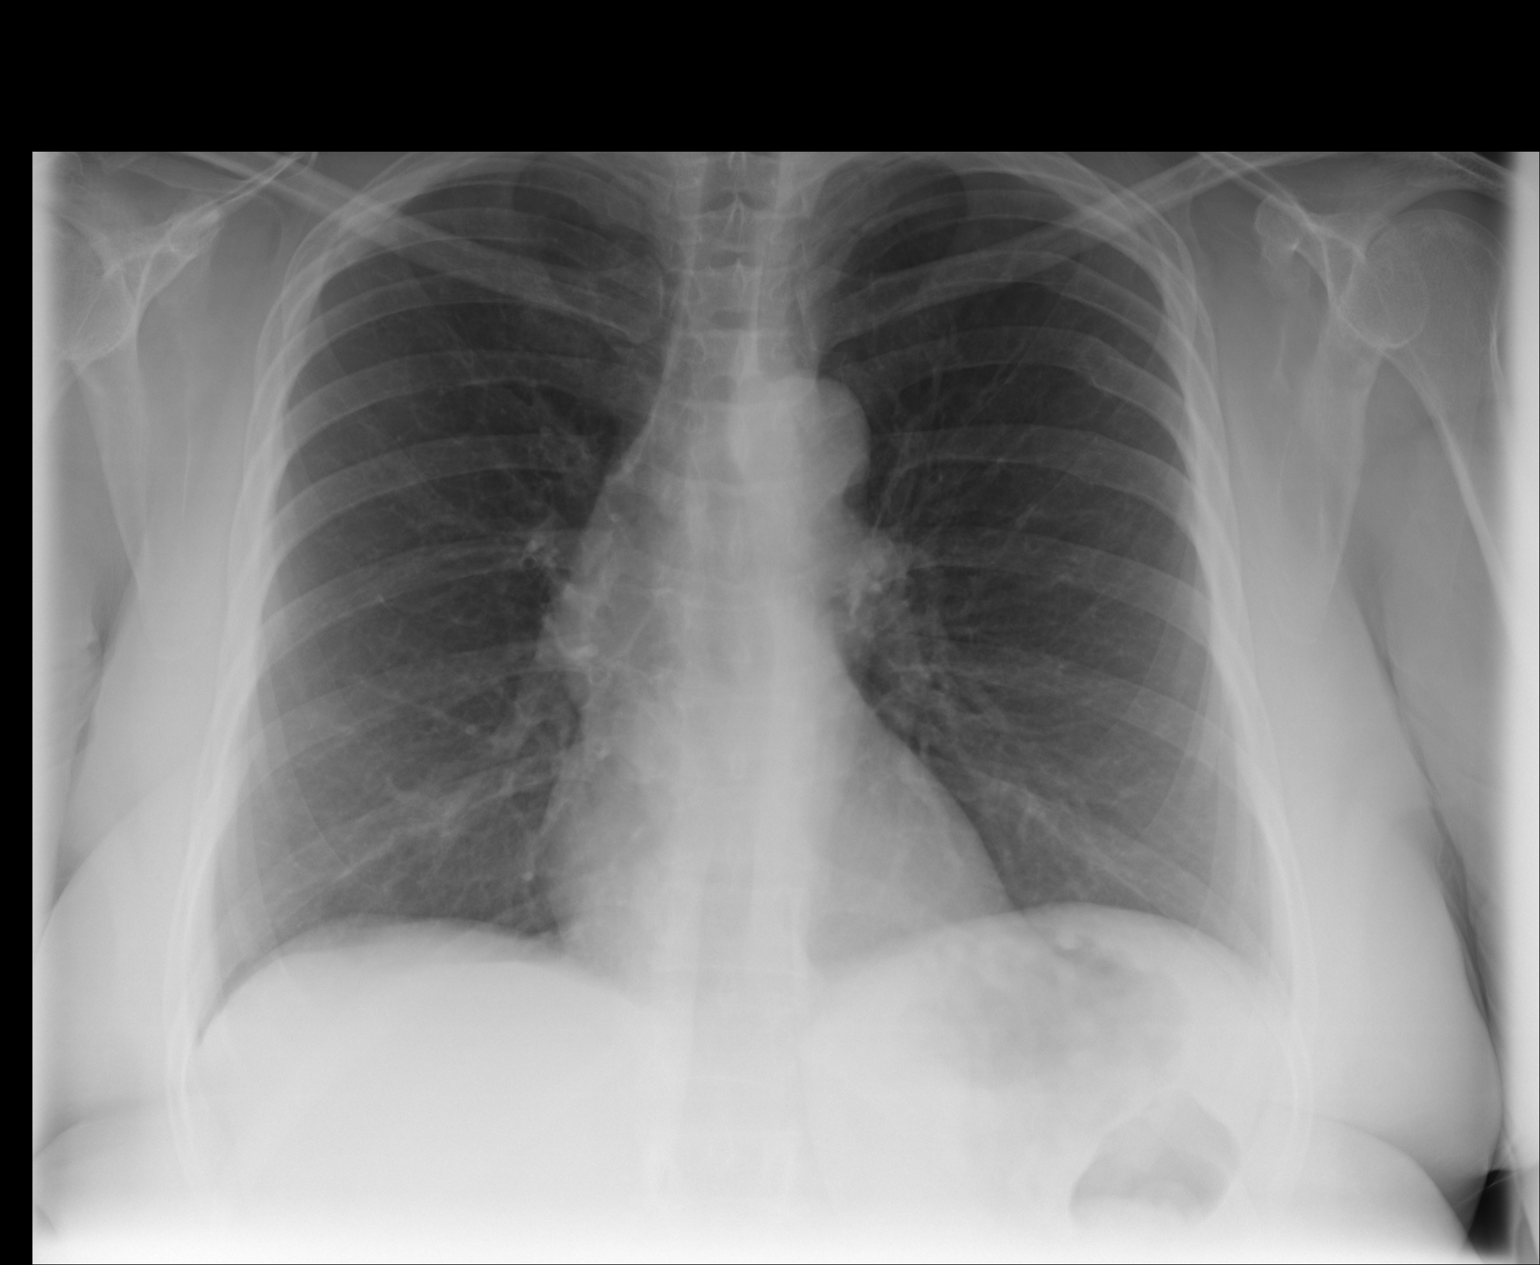
[im 2/2]
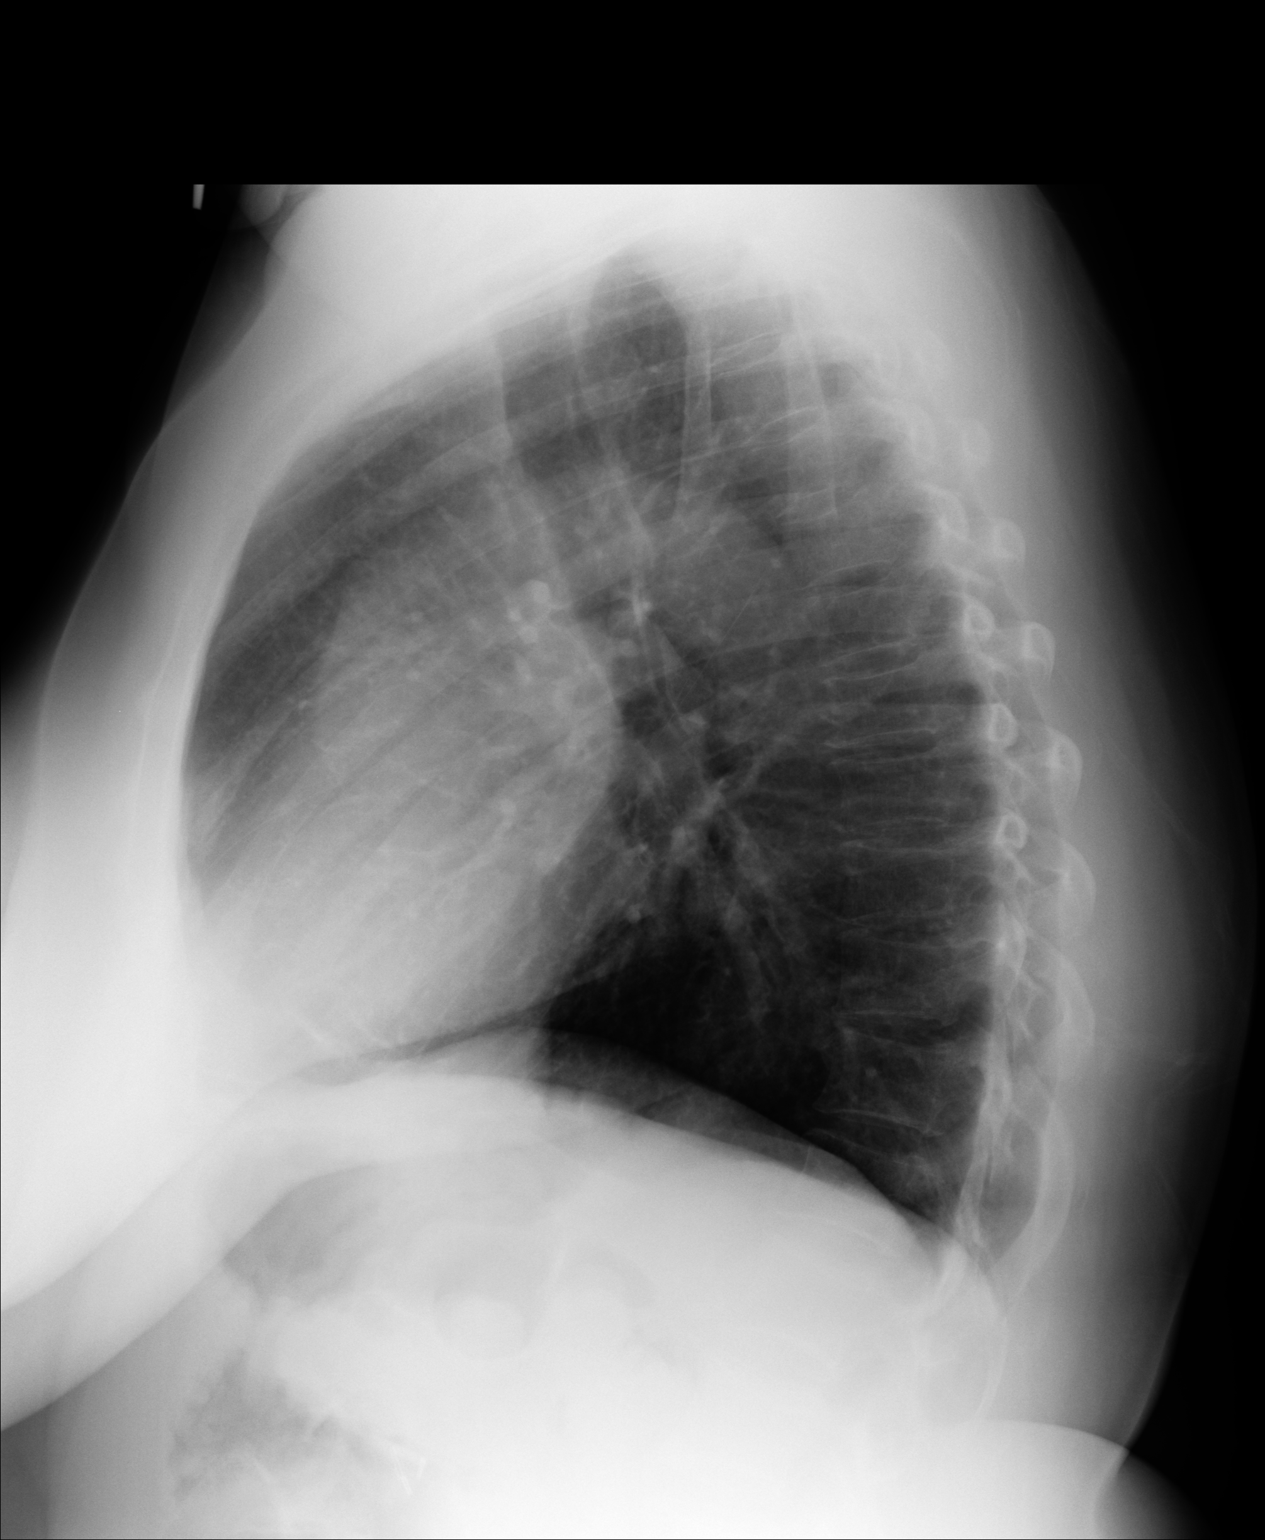

[2 of 2 positions shown; findings below may reference images not displayed]

PROCEDURE:     DXR - DXR CHEST PA (OR AP) AND LATERAL  - January 01, 2010  [DATE]

RESULT:     Comparison is made to the study 21 December, 2009.

The lungs are well-expanded. There is no focal infiltrate. The cardiac
silhouette is normal in size. The pulmonary vascularity is not engorged.
There is no pleural effusion or pneumothorax.
IMPRESSION: I do not see evidence of pneumonia. I cannot exclude acute
bronchitis superimposed upon reactive airway disease or COPD. There is old
deformity of the left fifth and sixth ribs.

## 2011-12-29 ENCOUNTER — Emergency Department: Payer: Self-pay | Admitting: Emergency Medicine

## 2011-12-29 LAB — CBC
HCT: 35.8 % (ref 35.0–47.0)
MCH: 32.7 pg (ref 26.0–34.0)
MCHC: 33.1 g/dL (ref 32.0–36.0)
MCV: 99 fL (ref 80–100)
RBC: 3.63 10*6/uL — ABNORMAL LOW (ref 3.80–5.20)
RDW: 13.5 % (ref 11.5–14.5)
WBC: 7 10*3/uL (ref 3.6–11.0)

## 2011-12-29 LAB — COMPREHENSIVE METABOLIC PANEL
Albumin: 2.8 g/dL — ABNORMAL LOW (ref 3.4–5.0)
Alkaline Phosphatase: 205 U/L — ABNORMAL HIGH (ref 50–136)
Anion Gap: 7 (ref 7–16)
BUN: 5 mg/dL — ABNORMAL LOW (ref 7–18)
Chloride: 109 mmol/L — ABNORMAL HIGH (ref 98–107)
Creatinine: 0.89 mg/dL (ref 0.60–1.30)
EGFR (African American): >60
EGFR (Non-African Amer.): >60
Osmolality: 284 (ref 275–301)
Potassium: 3 mmol/L — ABNORMAL LOW (ref 3.5–5.1)
Sodium: 143 mmol/L (ref 136–145)

## 2011-12-29 LAB — LIPASE, BLOOD: Lipase: 272 U/L (ref 73–393)

## 2011-12-29 LAB — TROPONIN I: Troponin-I: <0.02 ng/mL

## 2011-12-29 LAB — URINALYSIS, COMPLETE
Bilirubin,UR: NEGATIVE
Blood: NEGATIVE
Glucose,UR: NEGATIVE mg/dL (ref 0–75)
Nitrite: NEGATIVE
RBC,UR: NONE SEEN /HPF (ref 0–5)
Specific Gravity: 1.008 (ref 1.003–1.030)

## 2012-02-19 ENCOUNTER — Inpatient Hospital Stay: Payer: Self-pay | Admitting: Internal Medicine

## 2012-02-19 LAB — URINALYSIS, COMPLETE
Bilirubin,UR: NEGATIVE
Glucose,UR: NEGATIVE mg/dL (ref 0–75)
Leukocyte Esterase: NEGATIVE
Nitrite: NEGATIVE
Specific Gravity: 1.046 (ref 1.003–1.030)
Squamous Epithelial: 1
WBC UR: <1 /HPF (ref 0–5)

## 2012-02-19 LAB — COMPREHENSIVE METABOLIC PANEL
Alkaline Phosphatase: 221 U/L — ABNORMAL HIGH (ref 50–136)
BUN: 6 mg/dL — ABNORMAL LOW (ref 7–18)
Bilirubin,Total: 1 mg/dL (ref 0.2–1.0)
Calcium, Total: 9.2 mg/dL (ref 8.5–10.1)
Chloride: 107 mmol/L (ref 98–107)
Creatinine: 0.77 mg/dL (ref 0.60–1.30)
EGFR (African American): >60
EGFR (Non-African Amer.): >60
Potassium: 3.8 mmol/L (ref 3.5–5.1)
SGOT(AST): 167 U/L — ABNORMAL HIGH (ref 15–37)
SGPT (ALT): 88 U/L — ABNORMAL HIGH (ref 12–78)
Total Protein: 9.4 g/dL — ABNORMAL HIGH (ref 6.4–8.2)

## 2012-02-19 LAB — CBC WITH DIFFERENTIAL/PLATELET
Basophil #: 0 10*3/uL (ref 0.0–0.1)
Eosinophil #: 0.1 10*3/uL (ref 0.0–0.7)
Lymphocyte #: 2.6 10*3/uL (ref 1.0–3.6)
Lymphocyte %: 42.8 %
MCH: 32.9 pg (ref 26.0–34.0)
MCHC: 34.3 g/dL (ref 32.0–36.0)
MCV: 96 fL (ref 80–100)
Monocyte %: 12.1 %
Platelet: 145 10*3/uL — ABNORMAL LOW (ref 150–440)
RDW: 14.1 % (ref 11.5–14.5)
WBC: 6 10*3/uL (ref 3.6–11.0)

## 2012-02-19 LAB — TROPONIN I: Troponin-I: <0.02 ng/mL

## 2012-02-20 LAB — CBC WITH DIFFERENTIAL/PLATELET
Basophil %: 0.2 %
Eosinophil #: 0 10*3/uL (ref 0.0–0.7)
Eosinophil %: 0 %
HCT: 37.9 % (ref 35.0–47.0)
HGB: 12.9 g/dL (ref 12.0–16.0)
Lymphocyte #: 0.8 10*3/uL — ABNORMAL LOW (ref 1.0–3.6)
MCH: 33.1 pg (ref 26.0–34.0)
MCV: 97 fL (ref 80–100)
Monocyte #: 0.1 x10 3/mm — ABNORMAL LOW (ref 0.2–0.9)
RBC: 3.9 10*6/uL (ref 3.80–5.20)
RDW: 13.6 % (ref 11.5–14.5)
WBC: 3.8 10*3/uL (ref 3.6–11.0)

## 2012-02-20 LAB — COMPREHENSIVE METABOLIC PANEL
Alkaline Phosphatase: 198 U/L — ABNORMAL HIGH (ref 50–136)
Anion Gap: 8 (ref 7–16)
BUN: 6 mg/dL — ABNORMAL LOW (ref 7–18)
Bilirubin,Total: 0.9 mg/dL (ref 0.2–1.0)
Chloride: 105 mmol/L (ref 98–107)
Creatinine: 0.78 mg/dL (ref 0.60–1.30)
EGFR (African American): >60
Glucose: 142 mg/dL — ABNORMAL HIGH (ref 65–99)
SGPT (ALT): 73 U/L (ref 12–78)
Sodium: 138 mmol/L (ref 136–145)
Total Protein: 8.8 g/dL — ABNORMAL HIGH (ref 6.4–8.2)

## 2012-02-20 LAB — LIPASE, BLOOD: Lipase: 1026 U/L — ABNORMAL HIGH (ref 73–393)

## 2012-02-21 LAB — COMPREHENSIVE METABOLIC PANEL
Albumin: 3.1 g/dL — ABNORMAL LOW (ref 3.4–5.0)
Alkaline Phosphatase: 172 U/L — ABNORMAL HIGH (ref 50–136)
Anion Gap: 6 — ABNORMAL LOW (ref 7–16)
BUN: 7 mg/dL (ref 7–18)
Bilirubin,Total: 0.7 mg/dL (ref 0.2–1.0)
Calcium, Total: 8.5 mg/dL (ref 8.5–10.1)
Creatinine: 0.66 mg/dL (ref 0.60–1.30)
EGFR (African American): >60
Glucose: 109 mg/dL — ABNORMAL HIGH (ref 65–99)
Osmolality: 282 (ref 275–301)
Potassium: 3.7 mmol/L (ref 3.5–5.1)
SGOT(AST): 72 U/L — ABNORMAL HIGH (ref 15–37)
Sodium: 142 mmol/L (ref 136–145)
Total Protein: 8.1 g/dL (ref 6.4–8.2)

## 2012-02-21 LAB — URINE CULTURE

## 2012-02-22 LAB — URINALYSIS, COMPLETE
Bacteria: NONE SEEN
Bilirubin,UR: NEGATIVE
Blood: NEGATIVE
Glucose,UR: NEGATIVE mg/dL (ref 0–75)
Leukocyte Esterase: NEGATIVE
Nitrite: NEGATIVE
RBC,UR: NONE SEEN /HPF (ref 0–5)
WBC UR: <1 /HPF (ref 0–5)

## 2012-02-22 LAB — COMPREHENSIVE METABOLIC PANEL
Alkaline Phosphatase: 160 U/L — ABNORMAL HIGH (ref 50–136)
BUN: 9 mg/dL (ref 7–18)
Bilirubin,Total: 0.5 mg/dL (ref 0.2–1.0)
Co2: 24 mmol/L (ref 21–32)
Creatinine: 0.61 mg/dL (ref 0.60–1.30)
EGFR (Non-African Amer.): >60
Glucose: 88 mg/dL (ref 65–99)
Osmolality: 285 (ref 275–301)
SGPT (ALT): 48 U/L (ref 12–78)
Sodium: 144 mmol/L (ref 136–145)
Total Protein: 7.6 g/dL (ref 6.4–8.2)

## 2012-02-22 LAB — CBC WITH DIFFERENTIAL/PLATELET
Eosinophil #: 0 10*3/uL (ref 0.0–0.7)
Eosinophil %: 0.2 %
HGB: 11.5 g/dL — ABNORMAL LOW (ref 12.0–16.0)
Lymphocyte #: 2.5 10*3/uL (ref 1.0–3.6)
MCH: 31.7 pg (ref 26.0–34.0)
MCV: 97 fL (ref 80–100)
Monocyte #: 0.7 x10 3/mm (ref 0.2–0.9)
Neutrophil %: 52.7 %
Platelet: 109 10*3/uL — ABNORMAL LOW (ref 150–440)
RBC: 3.63 10*6/uL — ABNORMAL LOW (ref 3.80–5.20)
RDW: 14.3 % (ref 11.5–14.5)

## 2012-02-22 LAB — LIPASE, BLOOD: Lipase: 1672 U/L — ABNORMAL HIGH (ref 73–393)

## 2012-02-22 LAB — PROTIME-INR: INR: 1.1

## 2012-02-23 LAB — LIPID PANEL
HDL Cholesterol: 46 mg/dL (ref 40–60)
Ldl Cholesterol, Calc: 99 mg/dL (ref 0–100)
Triglycerides: 68 mg/dL (ref 0–200)
VLDL Cholesterol, Calc: 14 mg/dL (ref 5–40)

## 2012-02-23 LAB — LIPASE, BLOOD: Lipase: 1112 U/L — ABNORMAL HIGH (ref 73–393)

## 2012-02-24 LAB — URINE CULTURE

## 2012-02-24 LAB — LIPASE, BLOOD: Lipase: 1442 U/L — ABNORMAL HIGH (ref 73–393)

## 2012-03-27 ENCOUNTER — Inpatient Hospital Stay: Payer: Self-pay | Admitting: Internal Medicine

## 2012-03-27 LAB — LIPASE, BLOOD: Lipase: 826 U/L — ABNORMAL HIGH (ref 73–393)

## 2012-03-27 LAB — CBC WITH DIFFERENTIAL/PLATELET
Basophil #: 0.1 10*3/uL (ref 0.0–0.1)
Basophil %: 0.9 %
Eosinophil #: 0.1 10*3/uL (ref 0.0–0.7)
HGB: 13.8 g/dL (ref 12.0–16.0)
Lymphocyte %: 26.3 %
MCHC: 33.1 g/dL (ref 32.0–36.0)
MCV: 95 fL (ref 80–100)
Monocyte %: 12.2 %
Neutrophil %: 59.3 %
RBC: 4.4 10*6/uL (ref 3.80–5.20)
RDW: 14.6 % — ABNORMAL HIGH (ref 11.5–14.5)
WBC: 9.1 10*3/uL (ref 3.6–11.0)

## 2012-03-27 LAB — COMPREHENSIVE METABOLIC PANEL
Albumin: 3.7 g/dL (ref 3.4–5.0)
Alkaline Phosphatase: 232 U/L — ABNORMAL HIGH (ref 50–136)
Anion Gap: 14 (ref 7–16)
BUN: 13 mg/dL (ref 7–18)
Co2: 20 mmol/L — ABNORMAL LOW (ref 21–32)
Creatinine: 0.81 mg/dL (ref 0.60–1.30)
EGFR (Non-African Amer.): >60
Osmolality: 271 (ref 275–301)
Potassium: 3.8 mmol/L (ref 3.5–5.1)
Sodium: 135 mmol/L — ABNORMAL LOW (ref 136–145)
Total Protein: 9.6 g/dL — ABNORMAL HIGH (ref 6.4–8.2)

## 2012-03-27 LAB — URINALYSIS, COMPLETE
Bilirubin,UR: NEGATIVE
Glucose,UR: NEGATIVE mg/dL (ref 0–75)
Ketone: NEGATIVE
Leukocyte Esterase: NEGATIVE
RBC,UR: 2 /HPF (ref 0–5)
Squamous Epithelial: 5
WBC UR: 2 /HPF (ref 0–5)

## 2012-03-28 LAB — COMPREHENSIVE METABOLIC PANEL
Alkaline Phosphatase: 204 U/L — ABNORMAL HIGH (ref 50–136)
Anion Gap: 10 (ref 7–16)
BUN: 11 mg/dL (ref 7–18)
Bilirubin,Total: 1.3 mg/dL — ABNORMAL HIGH (ref 0.2–1.0)
Calcium, Total: 8.9 mg/dL (ref 8.5–10.1)
Co2: 24 mmol/L (ref 21–32)
EGFR (African American): >60
EGFR (Non-African Amer.): >60
Glucose: 85 mg/dL (ref 65–99)
Osmolality: 278 (ref 275–301)
Sodium: 140 mmol/L (ref 136–145)

## 2012-03-28 LAB — AMMONIA: Ammonia, Plasma: 45 mcmol/L — ABNORMAL HIGH (ref 11–32)

## 2012-03-28 LAB — PROTIME-INR: Prothrombin Time: 15.6 secs — ABNORMAL HIGH (ref 11.5–14.7)

## 2012-03-28 LAB — HEMOGLOBIN
HGB: 12.8 g/dL (ref 12.0–16.0)
HGB: 13 g/dL (ref 12.0–16.0)

## 2012-03-28 LAB — LIPASE, BLOOD: Lipase: 558 U/L — ABNORMAL HIGH (ref 73–393)

## 2012-03-29 LAB — HEMOGLOBIN: HGB: 12.4 g/dL (ref 12.0–16.0)

## 2012-03-29 LAB — HEPATIC FUNCTION PANEL A (ARMC)
Albumin: 3.5 g/dL (ref 3.4–5.0)
Alkaline Phosphatase: 191 U/L — ABNORMAL HIGH (ref 50–136)
Bilirubin,Total: 1 mg/dL (ref 0.2–1.0)

## 2012-04-02 IMAGING — CR DG CHEST 2V
1 series · 2 of 2 positions shown · non-contrast
Comparison: none

REASON FOR EXAM: cough and dyspnea
COMMENTS:

[Series 1: view not recorded · 0.17mm/px · 2 of 2 slices shown]
[im 1/2]
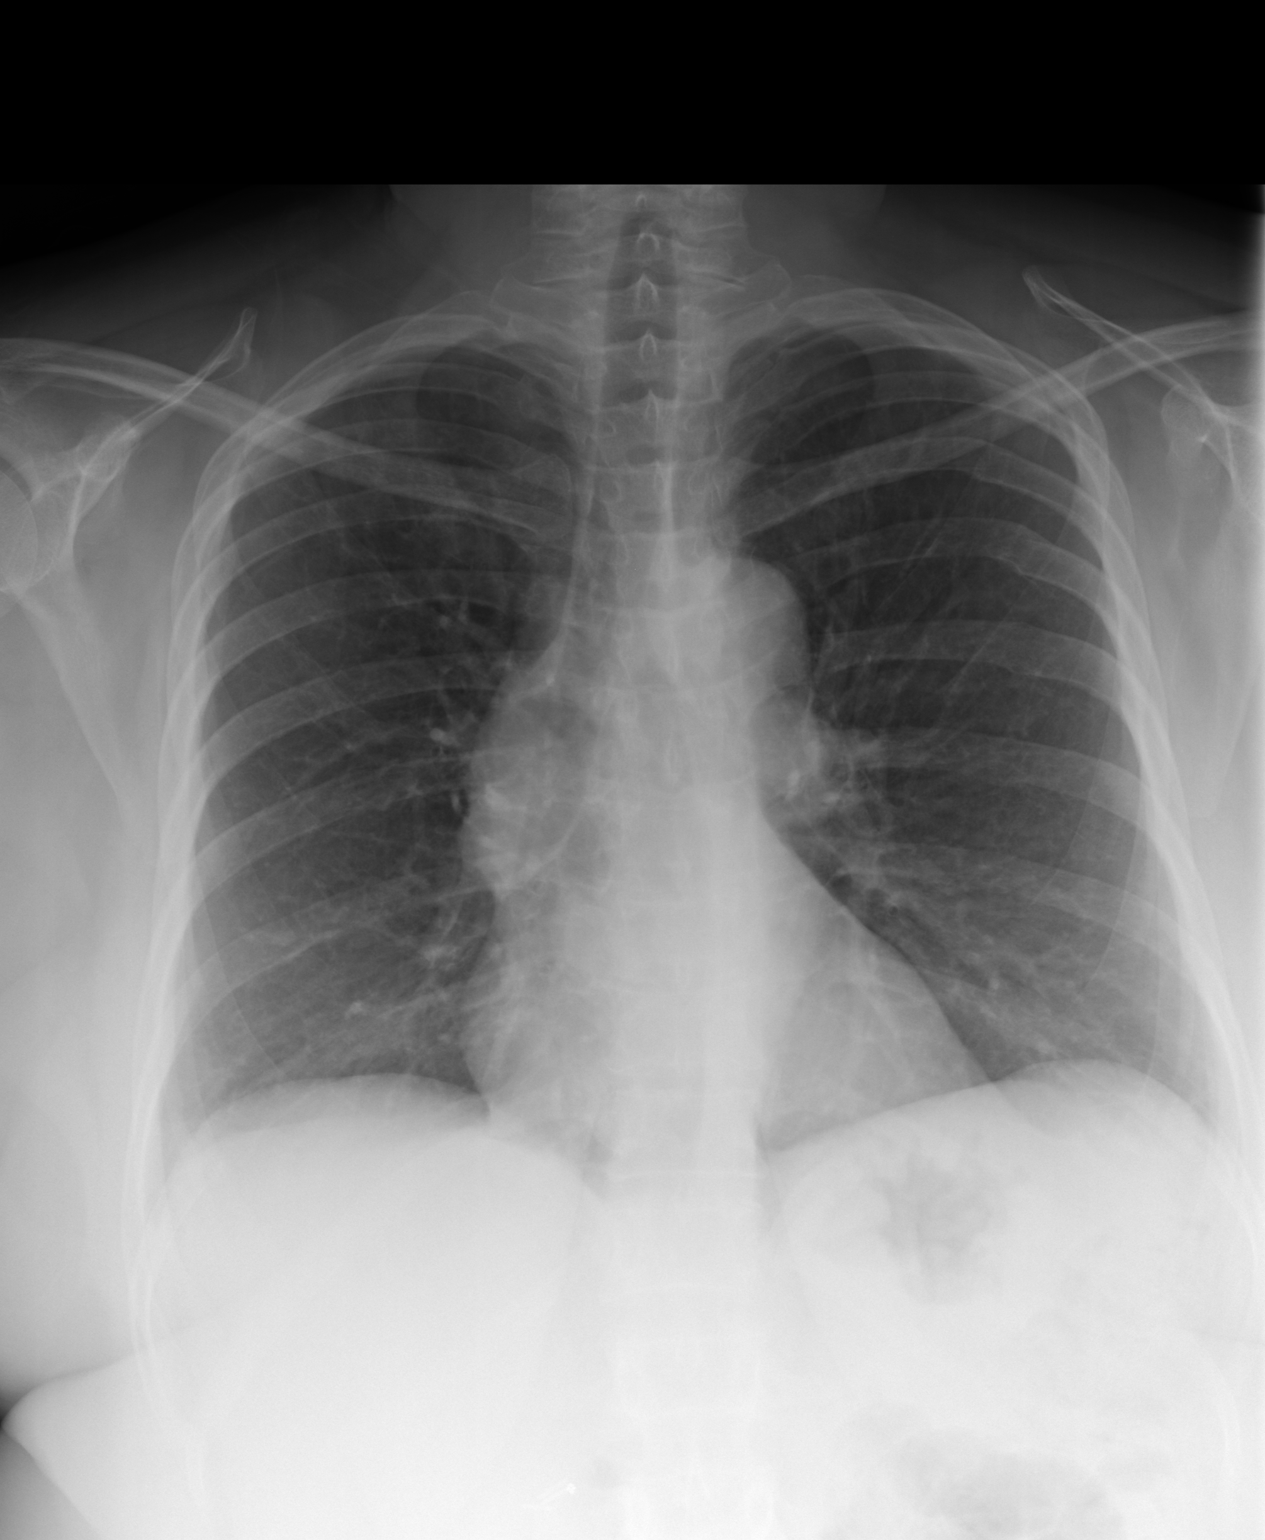
[im 2/2]
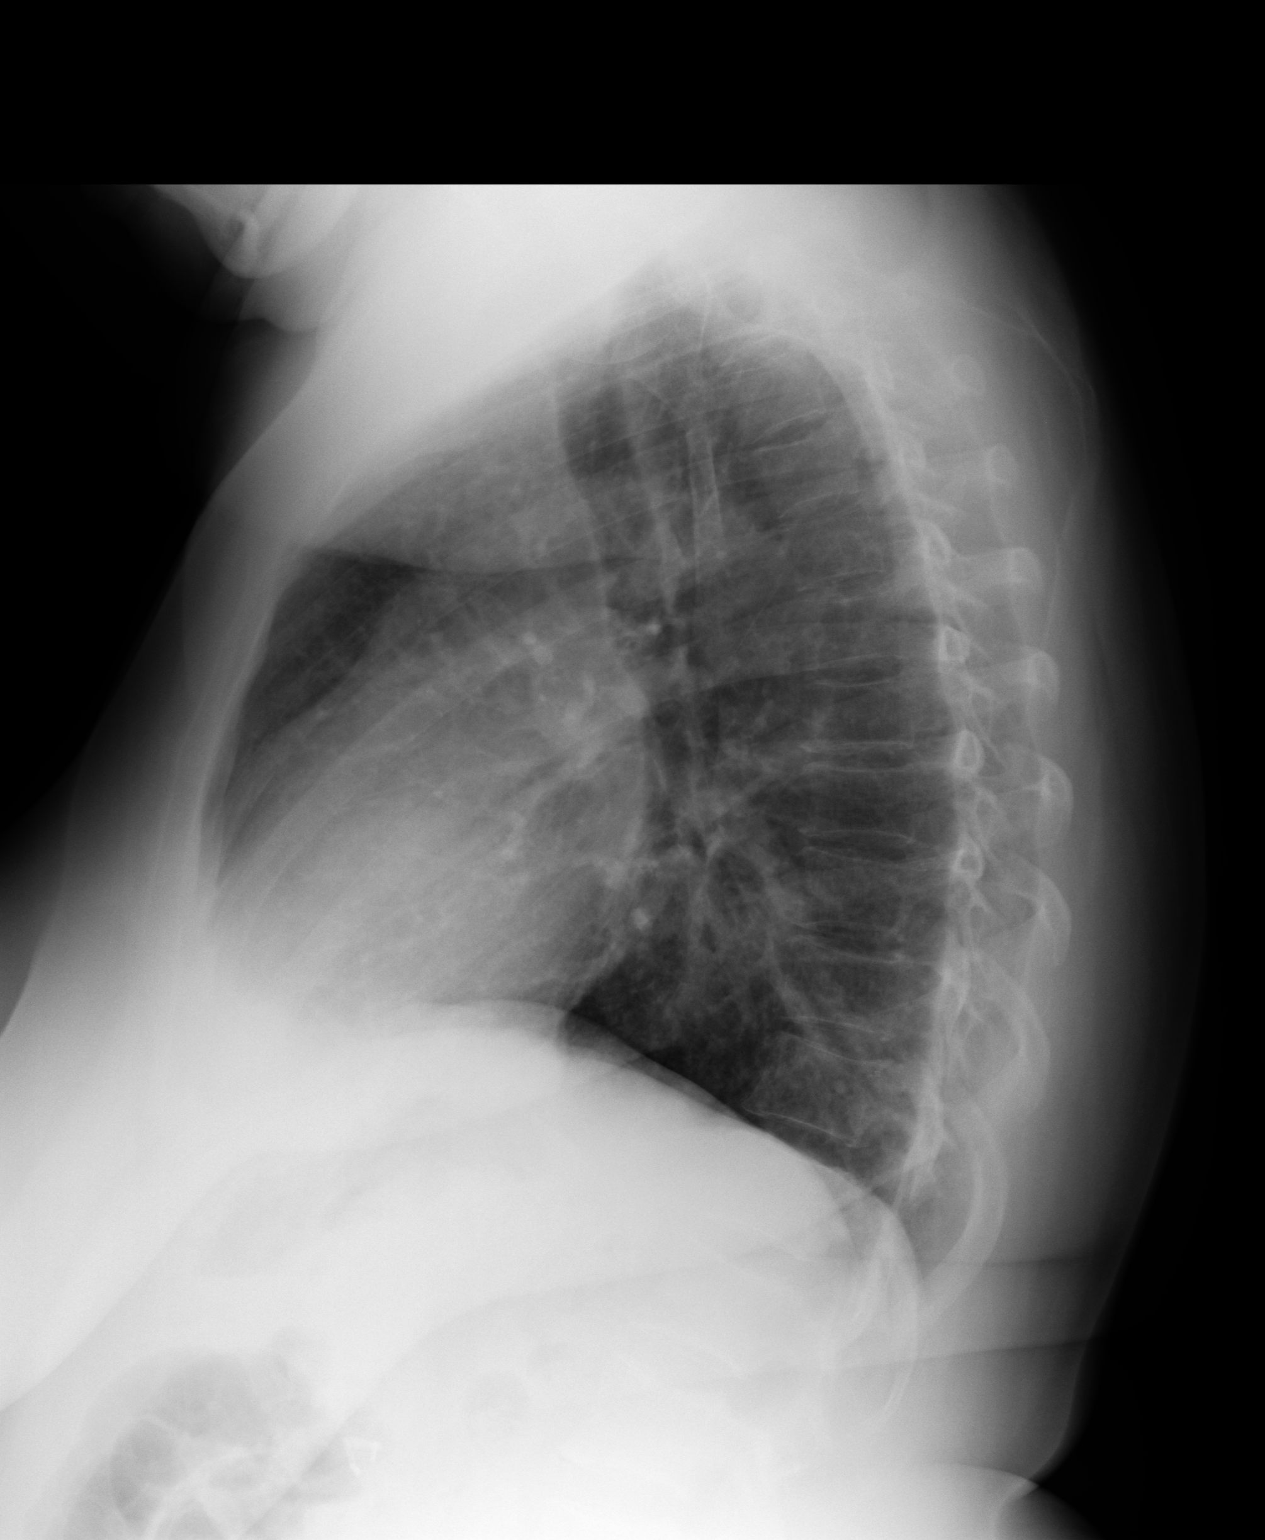

[2 of 2 positions shown; findings below may reference images not displayed]

PROCEDURE:     DXR - DXR CHEST PA (OR AP) AND LATERAL  - April 23, 2010 [DATE]

RESULT:     Comparison is made to the prior exam of 01/01/2010. The current
exam shows prominence of the right hilar region but this is thought to be
secondary to vascular prominence as opposed to adenopathy. A similar
appearance is seen on the topogram for the prior Chest CT of 11/15/2009, at
which time no adenopathy is seen. The peripheral lung fields bilaterally are
clear. Heart size is normal. There are old rib fracture deformities noted on
the left.
IMPRESSION: 1.     No acute changes are identified.

## 2012-04-24 IMAGING — CR DG ABDOMEN 3V
1 series · 6 of 6 positions shown · non-contrast
Comparison: none

REASON FOR EXAM: cough, abd pain, diarrhea, cp
COMMENTS:   May transport without cardiac monitor

[Series 1: view not recorded · 0.17mm/px · 6 of 6 slices shown]
[im 1/6]
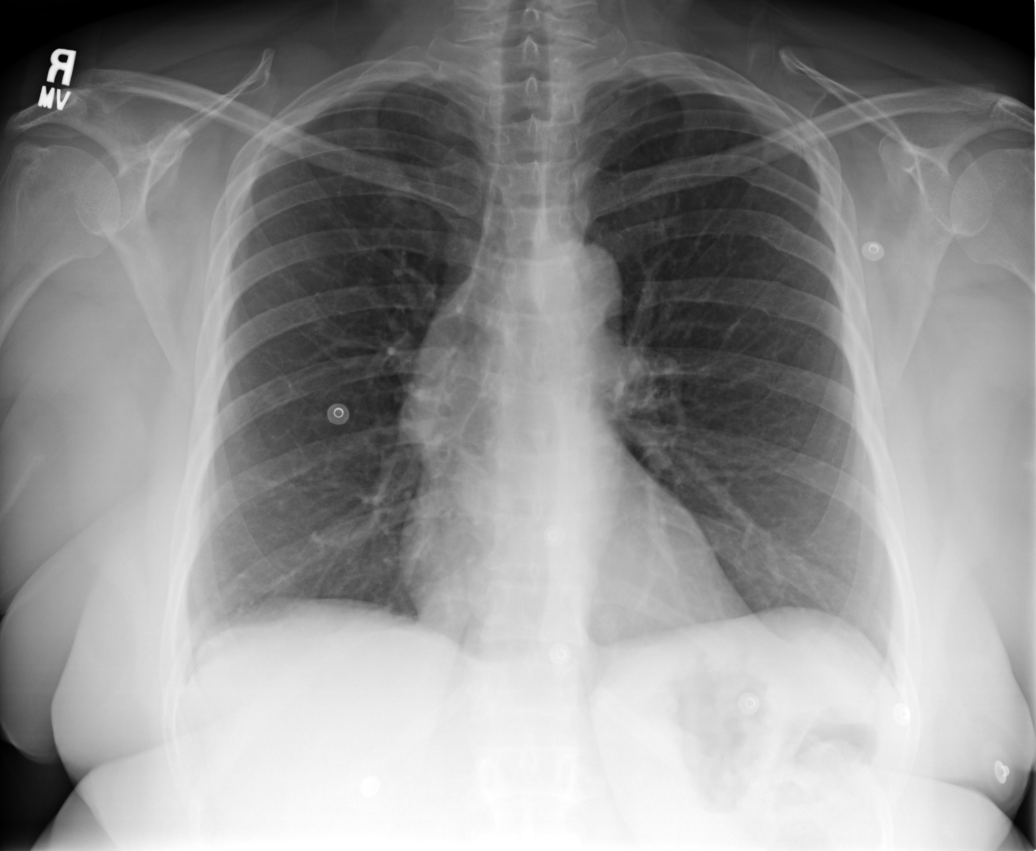
[im 2/6]
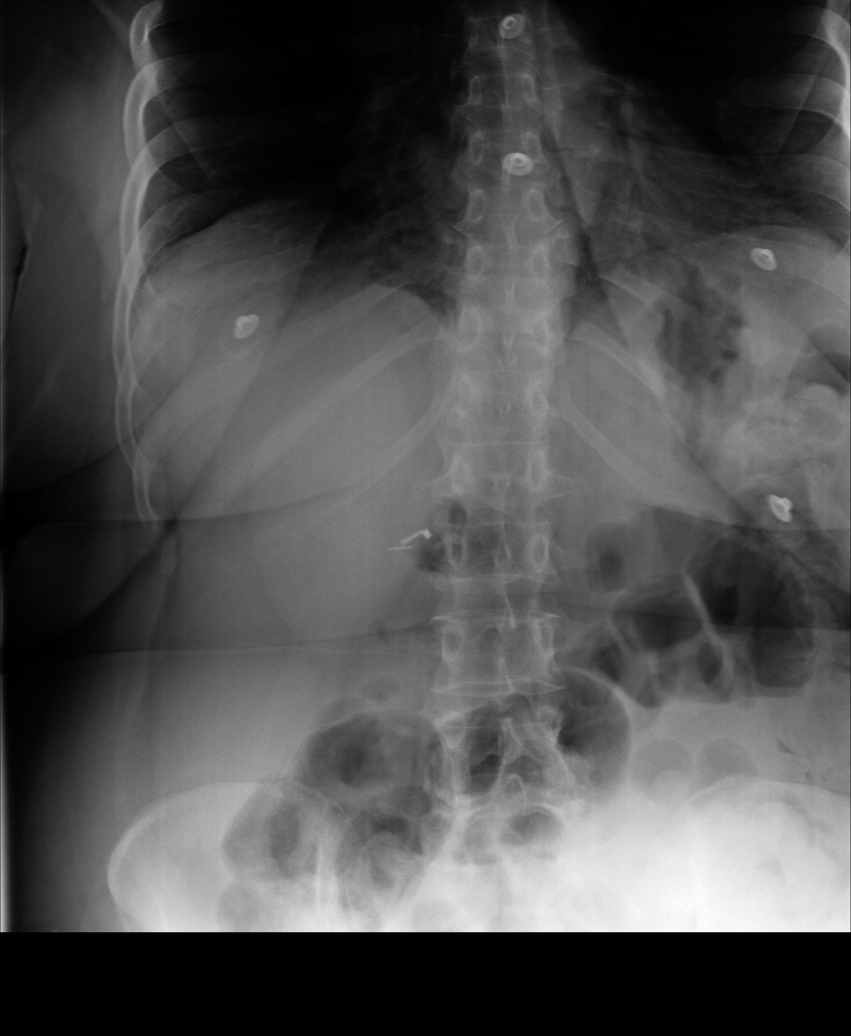
[im 3/6]
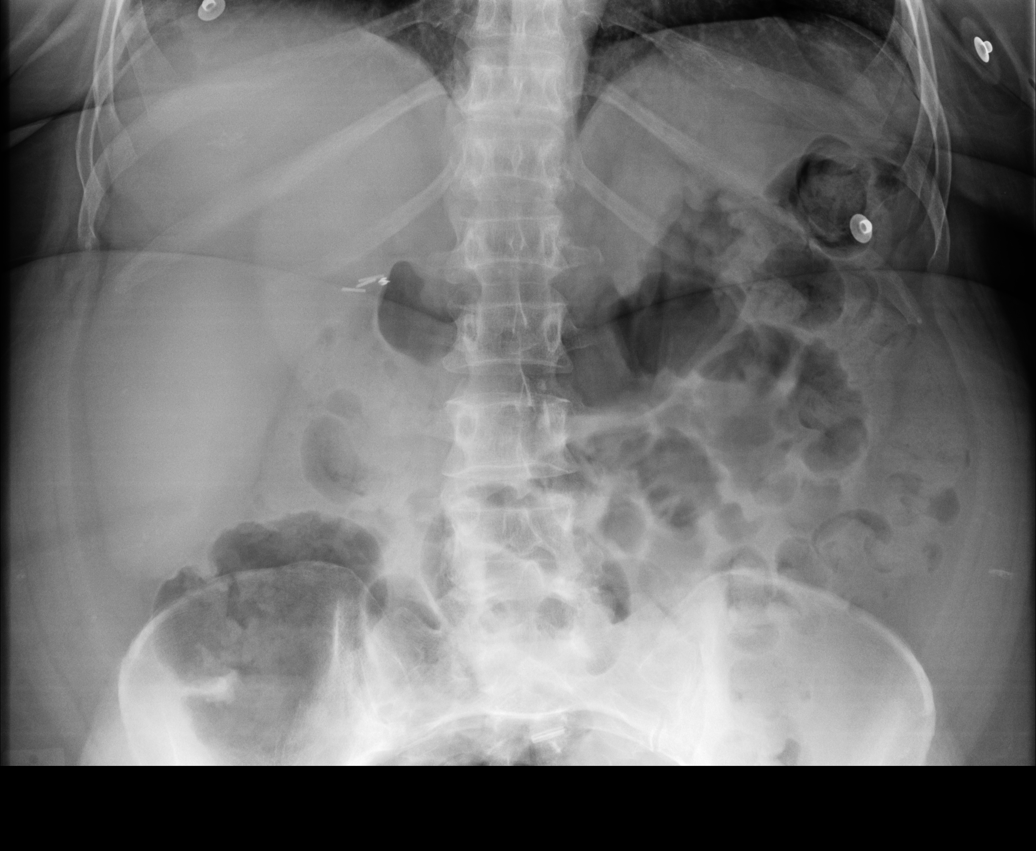
[im 4/6]
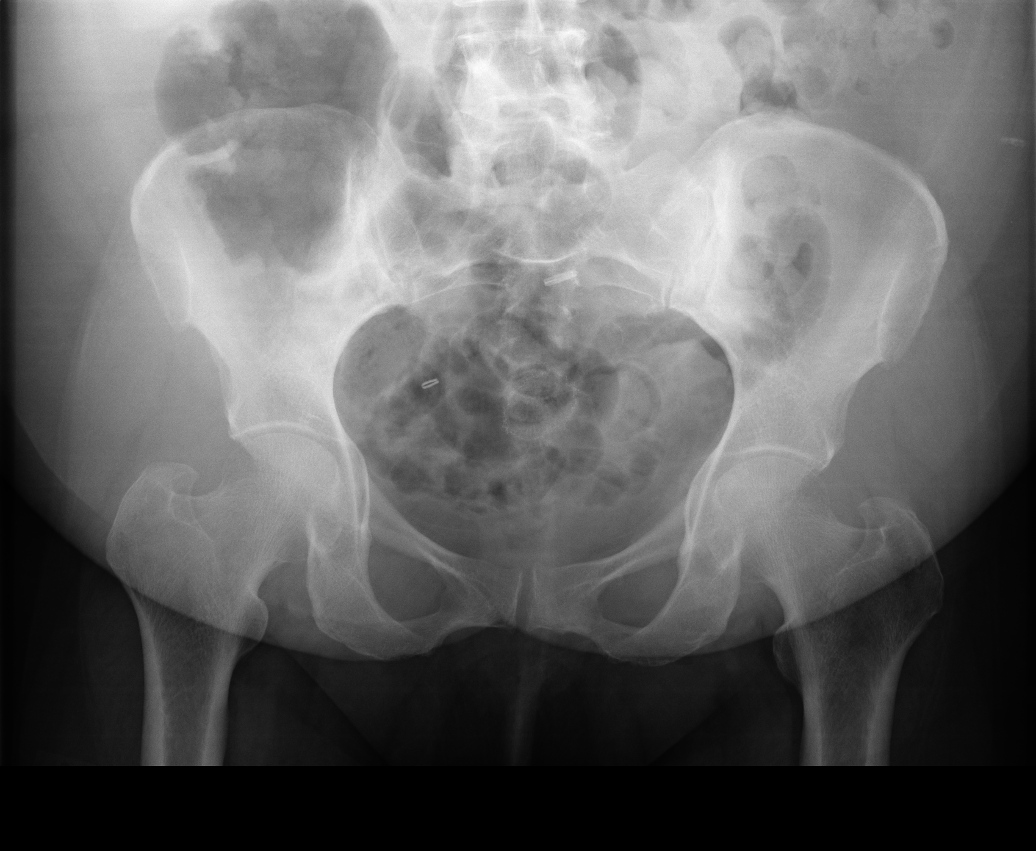
[im 5/6]
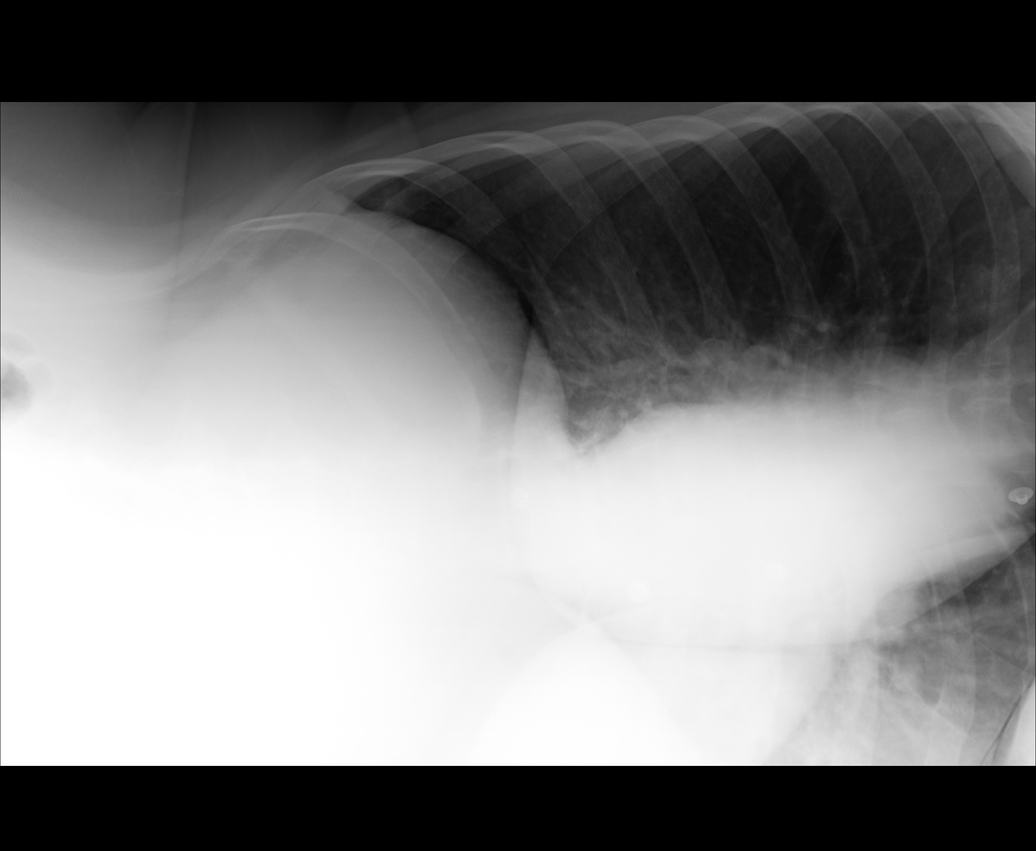
[im 6/6]
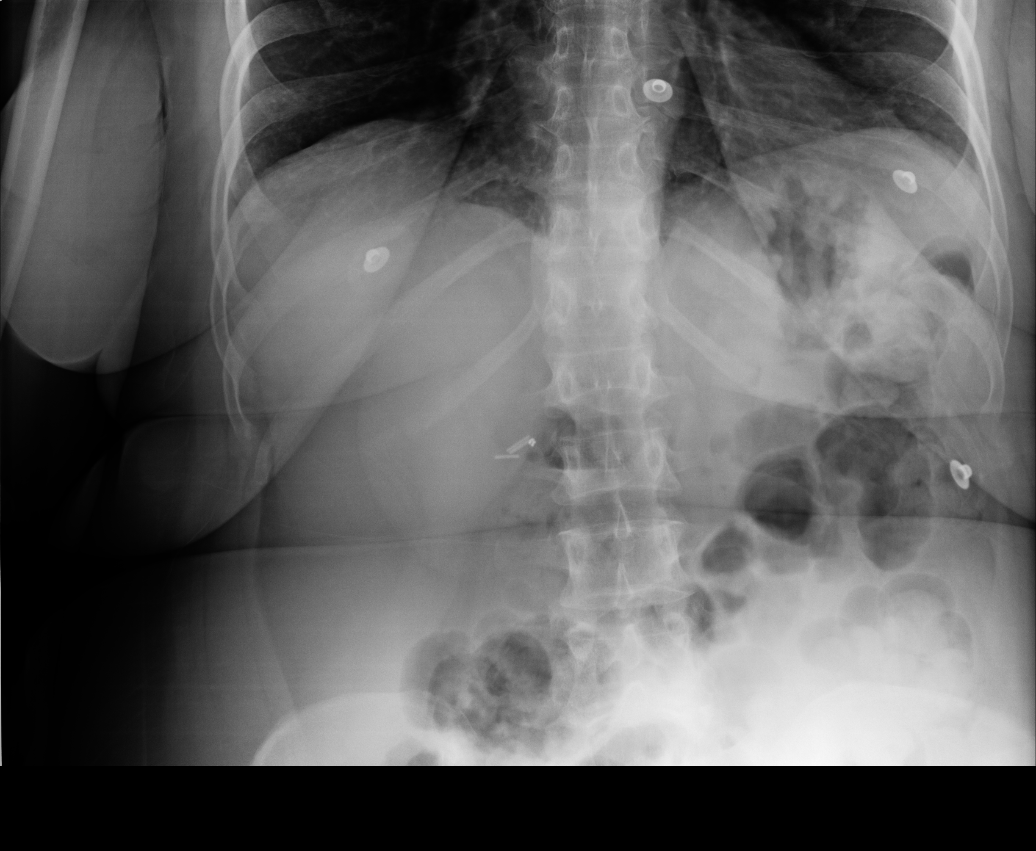

[6 of 6 positions shown; findings below may reference images not displayed]

PROCEDURE:     DXR - DXR ABDOMEN 3-WAY (INCL PA CXR)  - May 15, 2010 [DATE]

RESULT:     PA view of the chest shows the peripheral lung fields to be
clear. There is again noted prominence of the right hilar area, stable as
compared to the exam of 04/23/2010 and thought to be of doubtful clinical
significance since no corresponding abnormality is seen at CT. Multiple
views of the abdomen were obtained. No subdiaphragmatic free air is seen.
The bowel gas pattern shows no specific abnormalities. Gas is seen in both
large and small bowel. There is a mild increase in small bowel gas but no
significantly dilated loops are seen and no findings indicative of bowel
obstruction are observed. Postoperative metallic clips are noted in the
region of the gallbladder bed. No acute bony abnormalities are seen.
IMPRESSION: 1. No subdiaphragmatic free air is seen.
2. There are mildly dilated loops of small bowel but no bowel obstruction is
currently evident.
3. No abnormal intraabdominal calcifications are identified.

## 2012-04-24 IMAGING — CT CT ABD-PELV W/ CM
1 of 2 series · 16 of 32 positions shown, 20 images · non-contrast
Comparison: none

REASON FOR EXAM: (1) upper abd apin, some air fluid levels; (2) pain
COMMENTS:

[Series 2: abd/pel · axial · 0.81mm/px · z∈[+110,+530]mm · 16 of 152 slices shown, 20 images]
[im 6/152  soft-tissue]
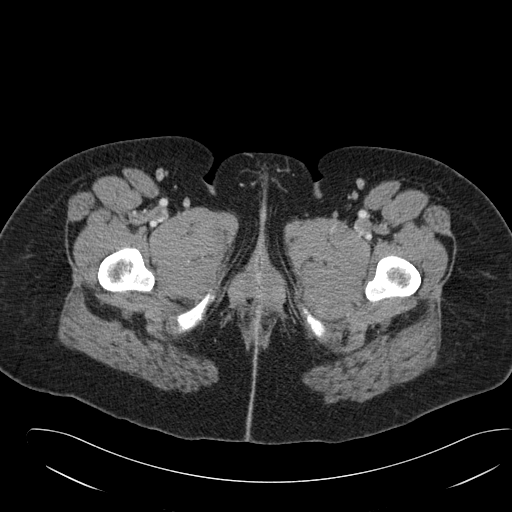
[im 6/152  bone]
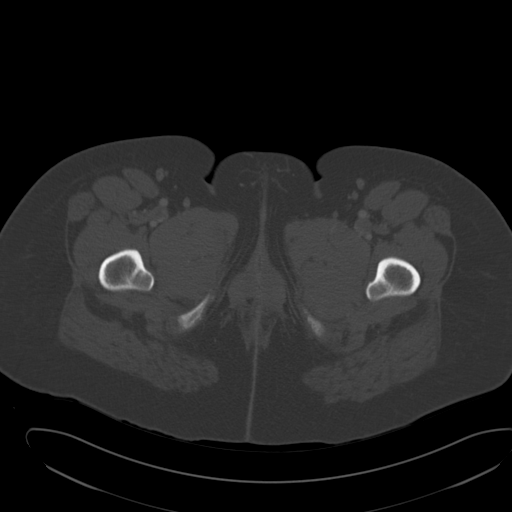
[im 18/152  soft-tissue]
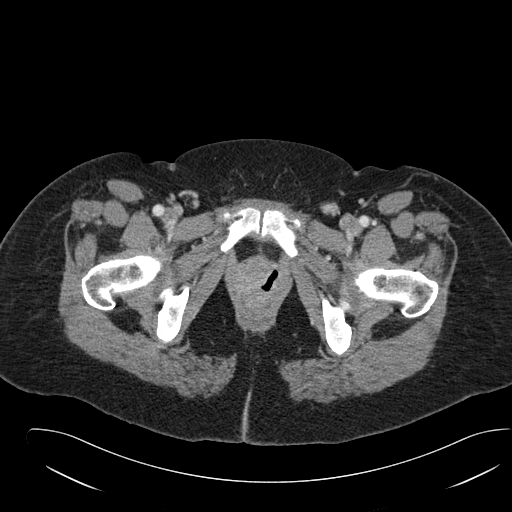
[im 30/152  soft-tissue]
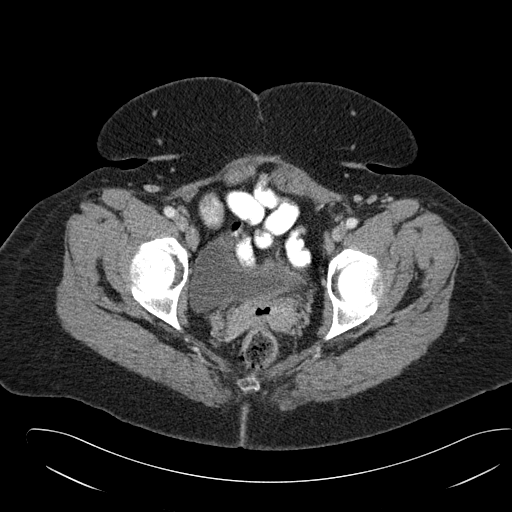
[im 41/152  soft-tissue]
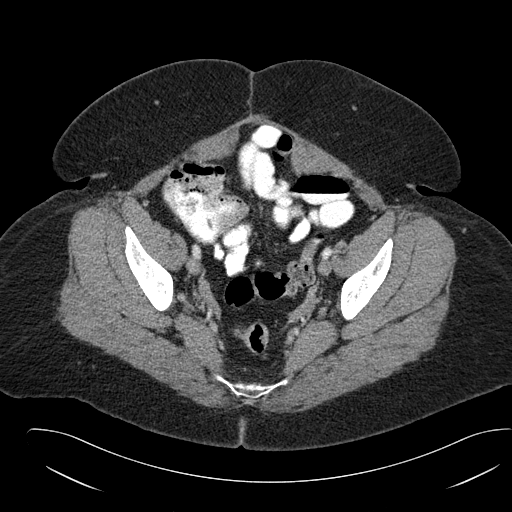
[im 53/152  soft-tissue]
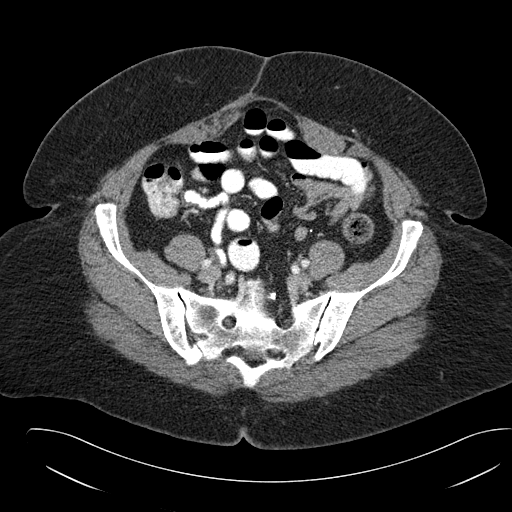
[im 59/152  soft-tissue]
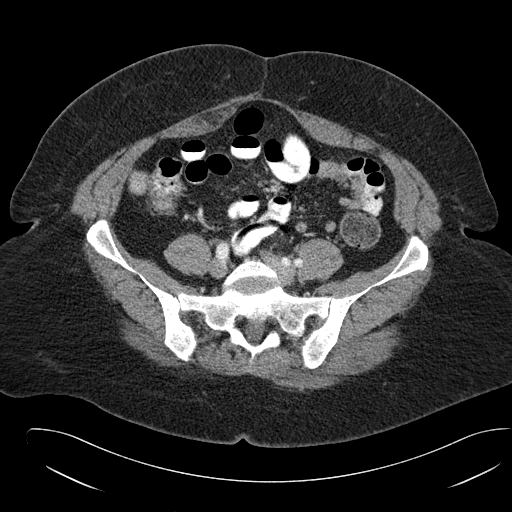
[im 70/152  soft-tissue]
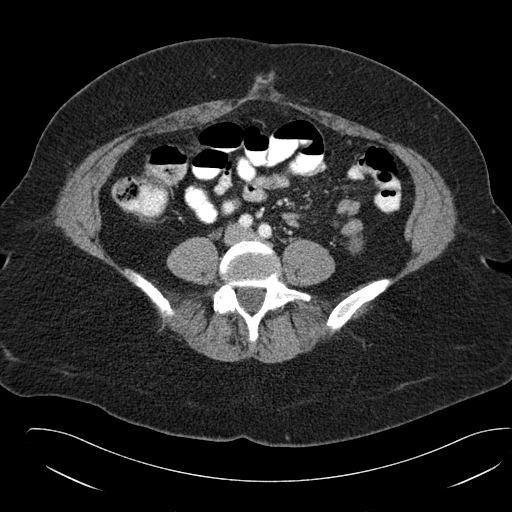
[im 82/152  soft-tissue]
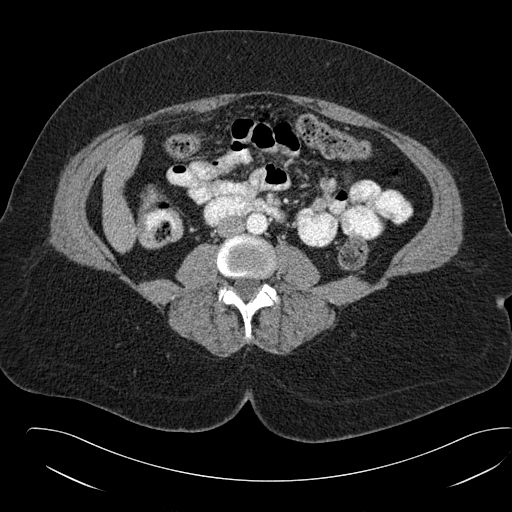
[im 93/152  soft-tissue]
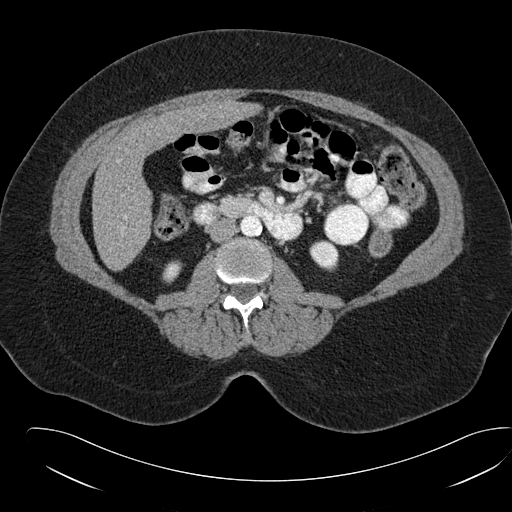
[im 93/152  bone]
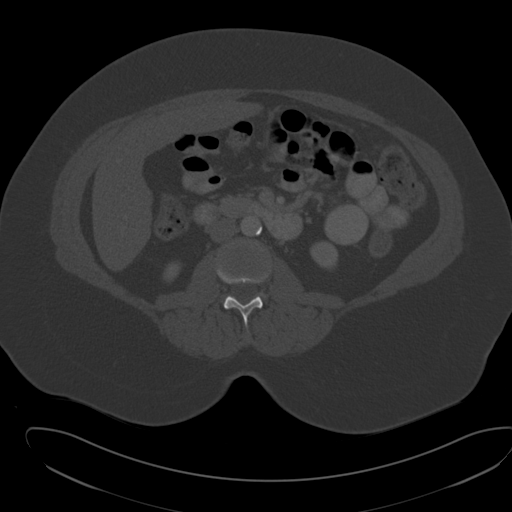
[im 99/152  soft-tissue]
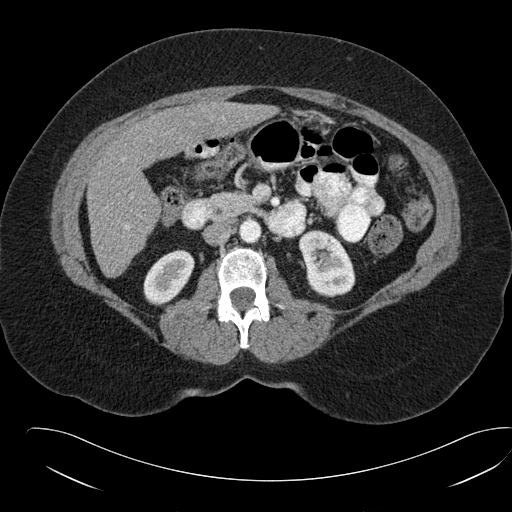
[im 111/152  soft-tissue]
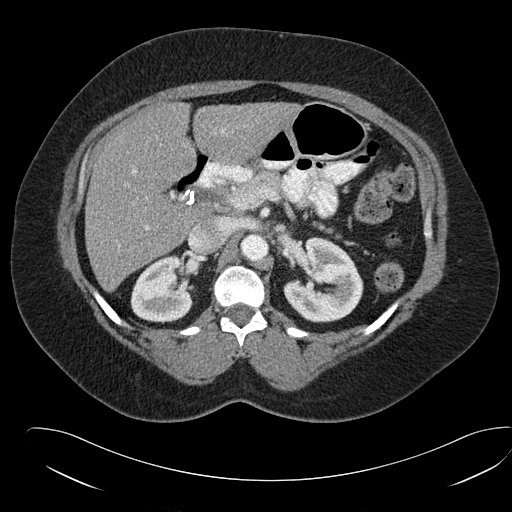
[im 122/152  soft-tissue]
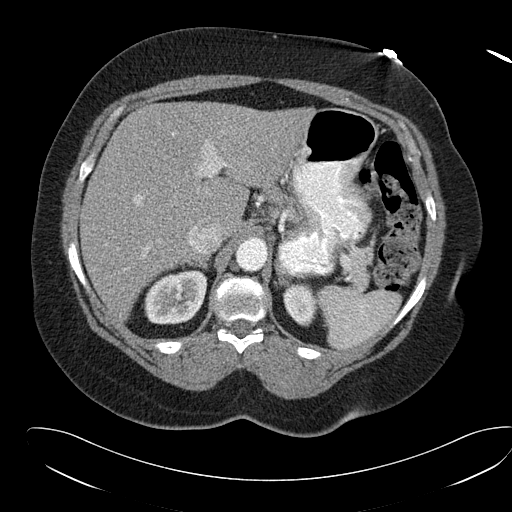
[im 128/152  lung]
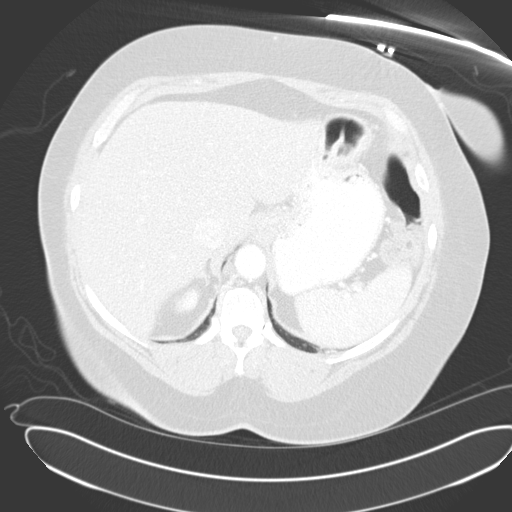
[im 134/152  soft-tissue]
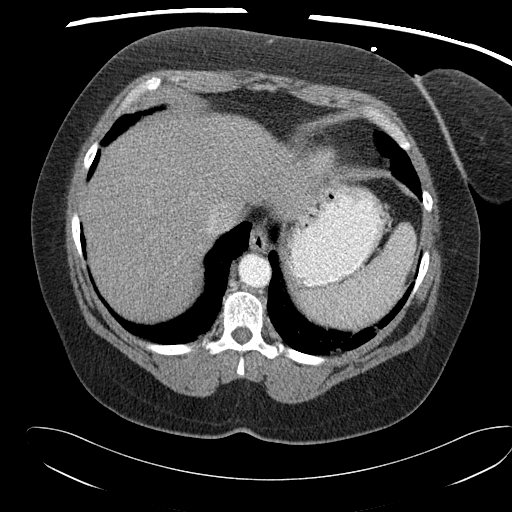
[im 134/152  lung]
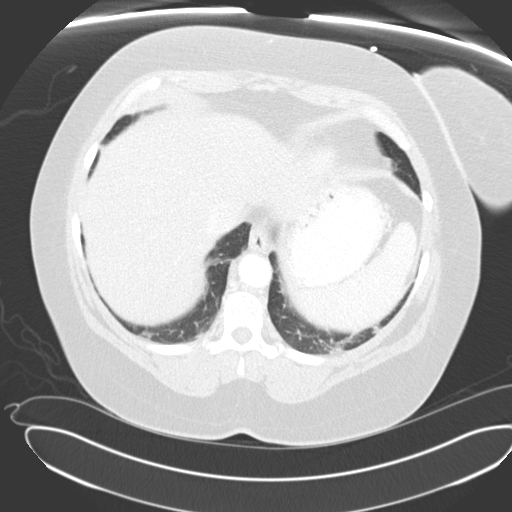
[im 140/152  lung]
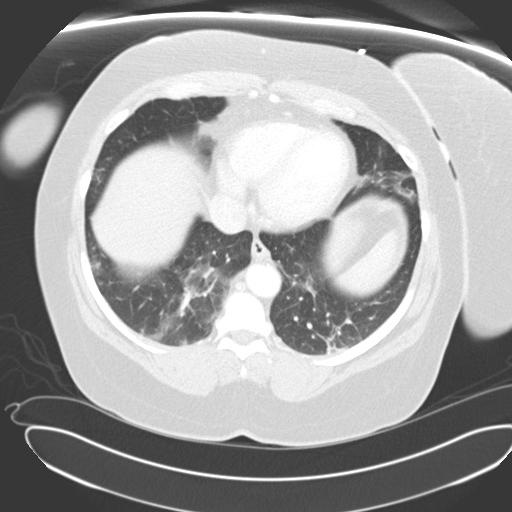
[im 146/152  soft-tissue]
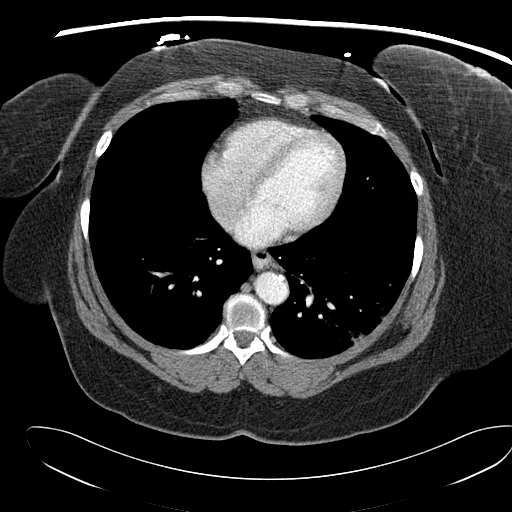
[im 146/152  lung]
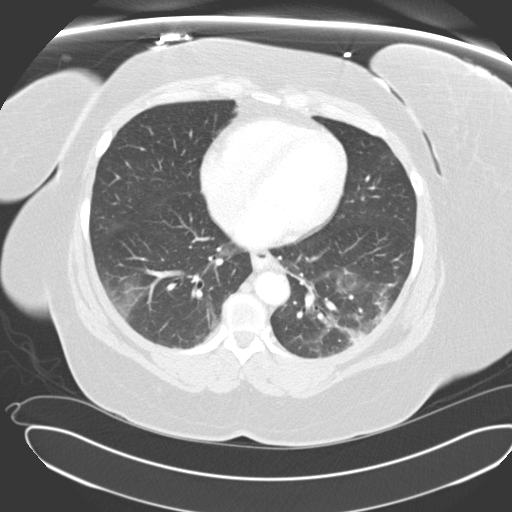

[16 of 32 positions shown; findings below may reference images not displayed]

PROCEDURE:     CT  - CT ABDOMEN / PELVIS  W  - May 15, 2010  [DATE]

RESULT:     IV contrast study performed for 100 cc of Hsovue-MLT. Right
lower quadrant unremarkable. No free pelvic fluid noted. Base atelectasis
present. A bibasal pneumonia cannot be excluded. Left adrenal nodular
density is noted most consistent adrenal adenoma. Adrenal lesion stable from
prior CT of 02/25/2008. Aorta nondistended.
IMPRESSION: Nonspecific exam. Left adrenal adenoma.

## 2012-05-11 ENCOUNTER — Emergency Department: Payer: Self-pay | Admitting: Emergency Medicine

## 2012-05-11 LAB — CBC
HGB: 13.4 g/dL (ref 12.0–16.0)
MCH: 30.4 pg (ref 26.0–34.0)
MCHC: 33.1 g/dL (ref 32.0–36.0)
Platelet: 196 10*3/uL (ref 150–440)
RDW: 15.3 % — ABNORMAL HIGH (ref 11.5–14.5)
WBC: 7.1 10*3/uL (ref 3.6–11.0)

## 2012-05-11 LAB — COMPREHENSIVE METABOLIC PANEL
Albumin: 3.6 g/dL (ref 3.4–5.0)
Alkaline Phosphatase: 226 U/L — ABNORMAL HIGH (ref 50–136)
Anion Gap: 10 (ref 7–16)
BUN: 7 mg/dL (ref 7–18)
Calcium, Total: 9.4 mg/dL (ref 8.5–10.1)
Creatinine: 0.77 mg/dL (ref 0.60–1.30)
EGFR (African American): >60
EGFR (Non-African Amer.): >60
Glucose: 86 mg/dL (ref 65–99)
Potassium: 3.7 mmol/L (ref 3.5–5.1)
SGOT(AST): 170 U/L — ABNORMAL HIGH (ref 15–37)
SGPT (ALT): 124 U/L — ABNORMAL HIGH (ref 12–78)
Total Protein: 10.1 g/dL — ABNORMAL HIGH (ref 6.4–8.2)

## 2012-05-11 LAB — URINALYSIS, COMPLETE
Glucose,UR: NEGATIVE mg/dL (ref 0–75)
Nitrite: NEGATIVE
Protein: NEGATIVE
RBC,UR: 5 /HPF (ref 0–5)
Specific Gravity: 1.015 (ref 1.003–1.030)
WBC UR: 5 /HPF (ref 0–5)

## 2012-05-11 LAB — LIPASE, BLOOD: Lipase: 1123 U/L — ABNORMAL HIGH (ref 73–393)

## 2012-05-12 IMAGING — CR DG WRIST COMPLETE 3+V*R*
1 series · 4 of 4 positions shown · non-contrast
Comparison: none

REASON FOR EXAM: pain/ injury    Flex 6
COMMENTS:

PROCEDURE:     DXR - DXR WRIST RT COMP WITH OBLIQUES  - June 02, 2010 [DATE]
RESULT:     Fracture fragment is noted along the posterior wrist on the
lateral view. This is most likely a triquetral fracture. The navicular is
intact. No other acute abnormalities are noted.

[Series 1: view not recorded · 0.17mm/px · 4 of 4 slices shown]
[im 1/4]
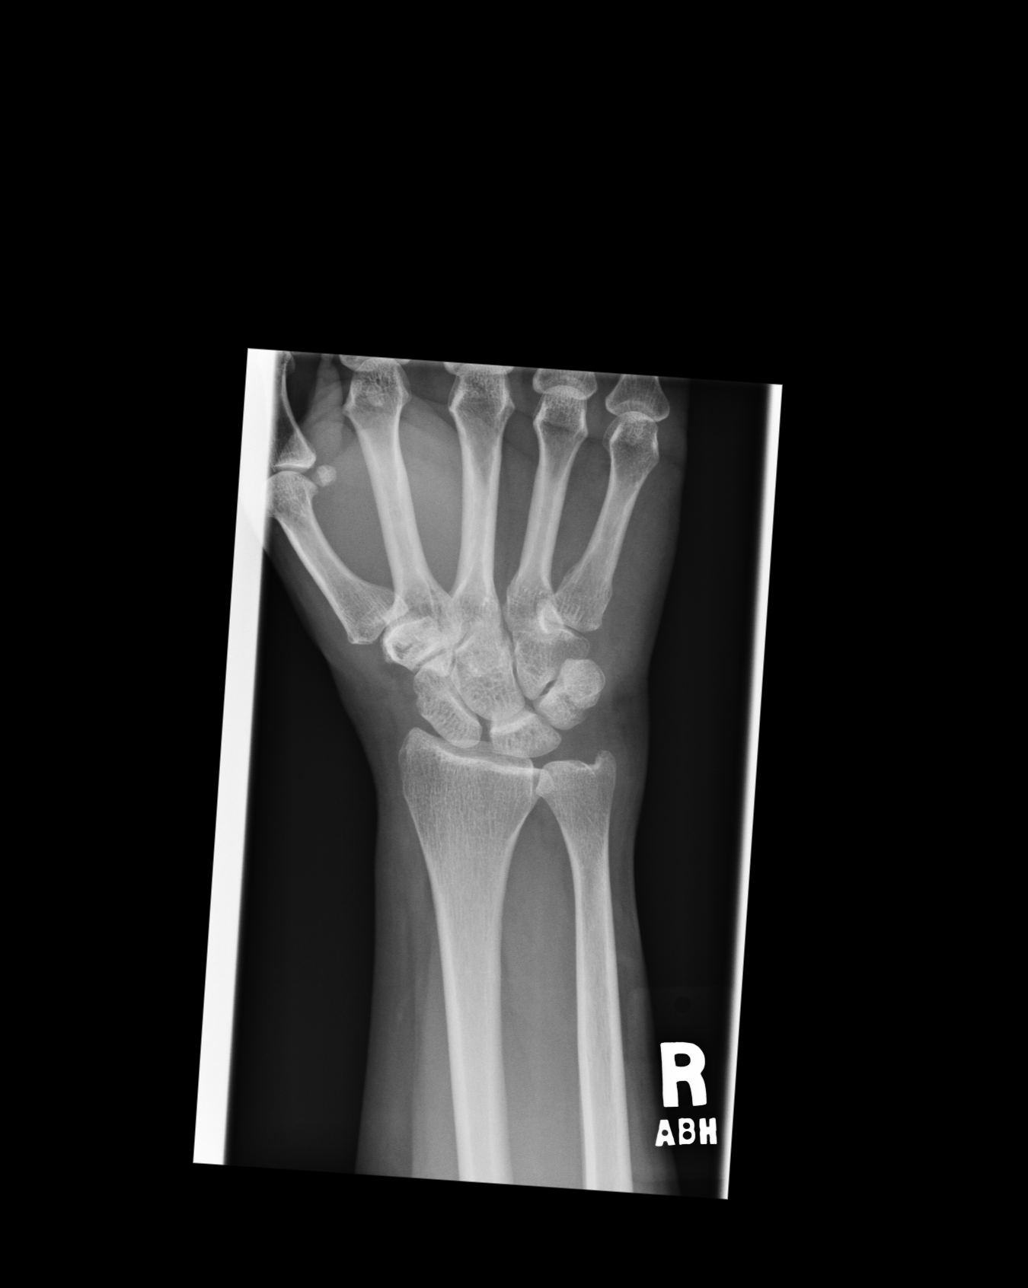
[im 2/4]
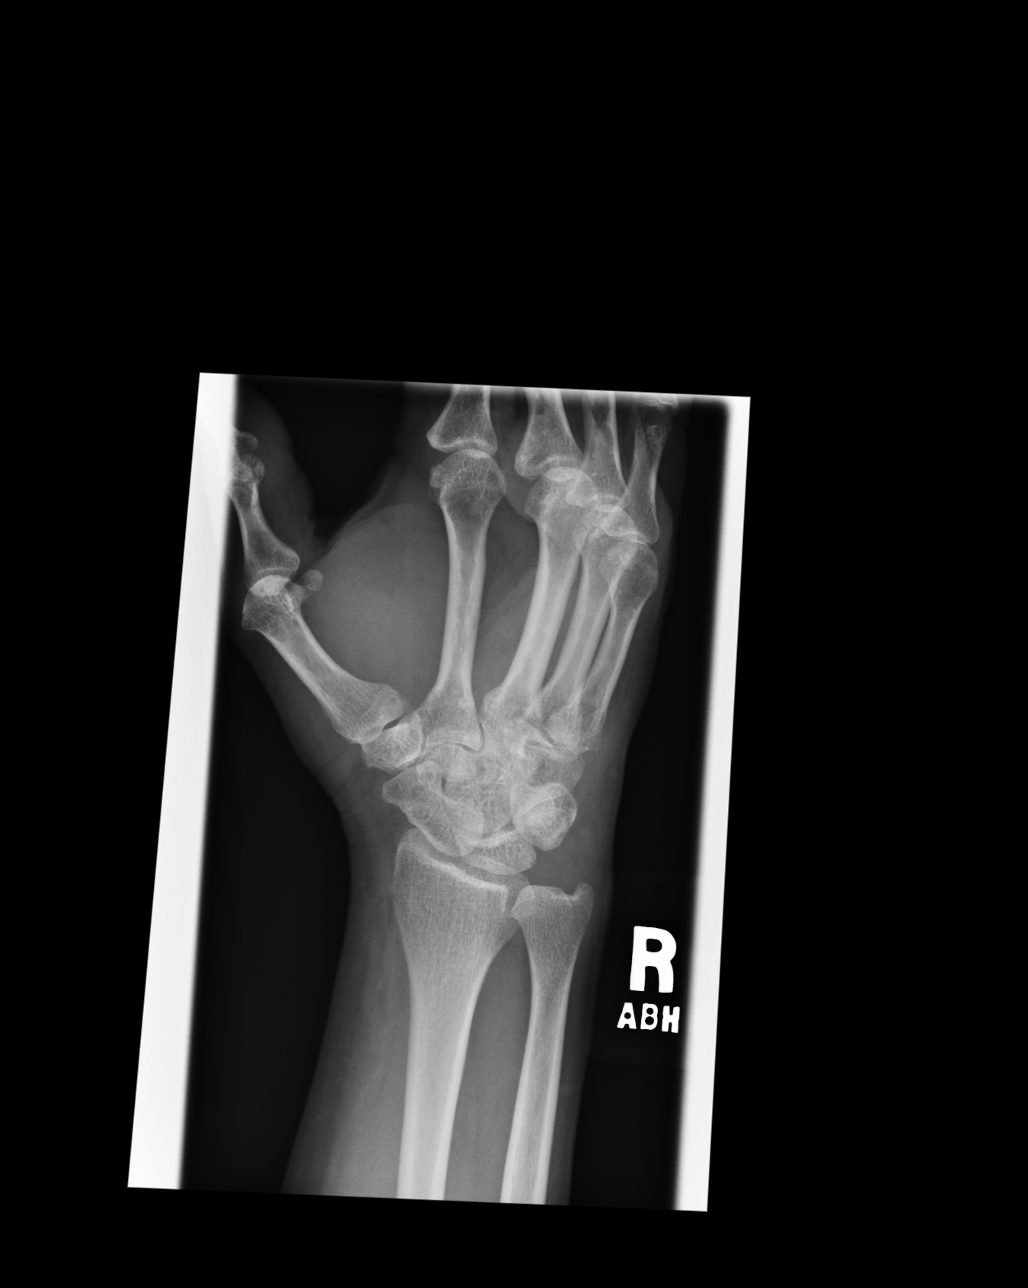
[im 3/4]
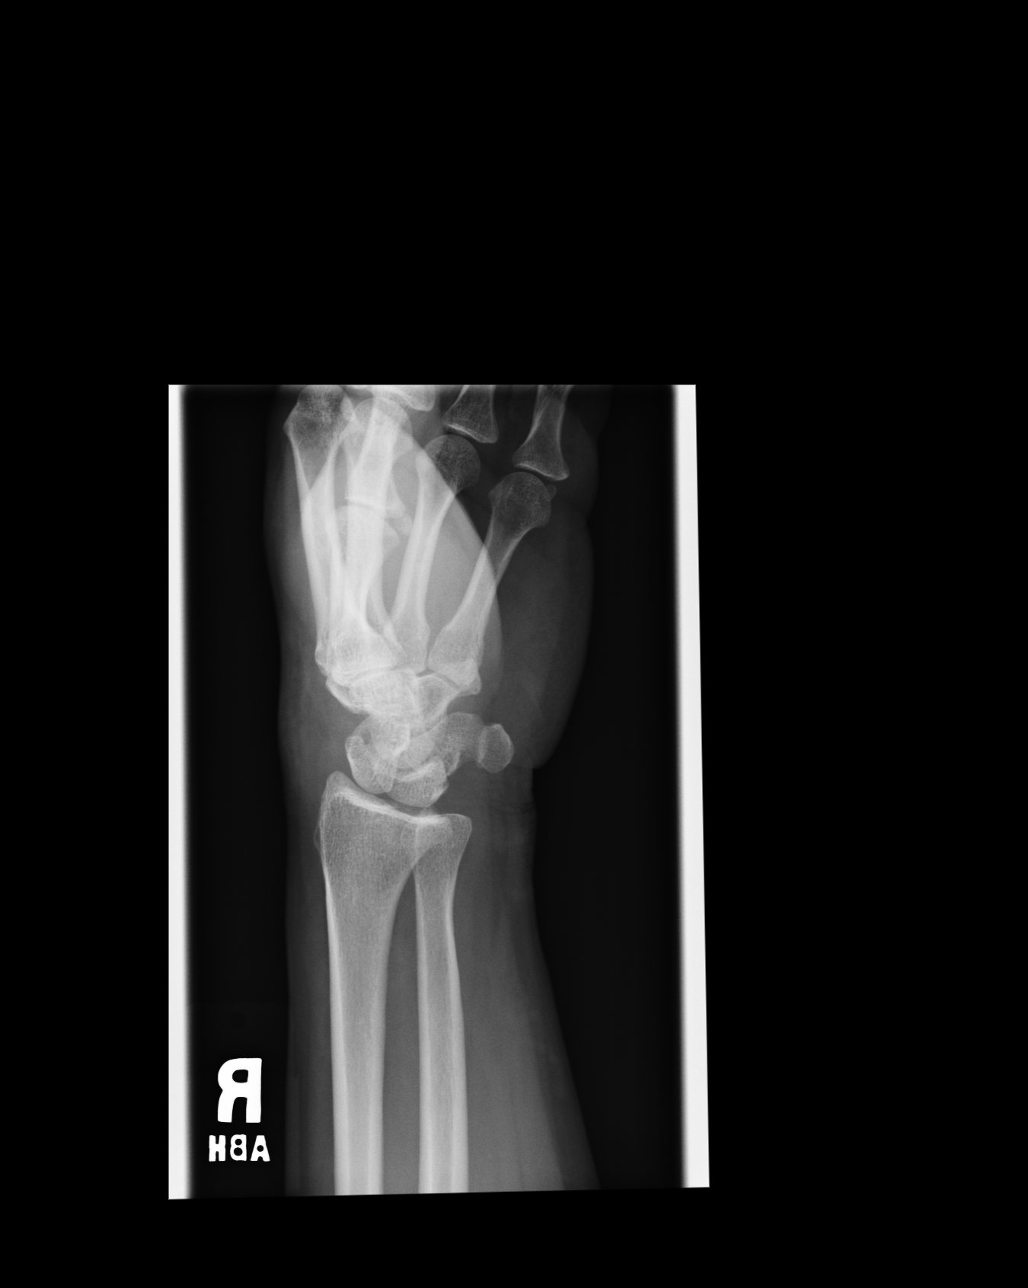
[im 4/4]
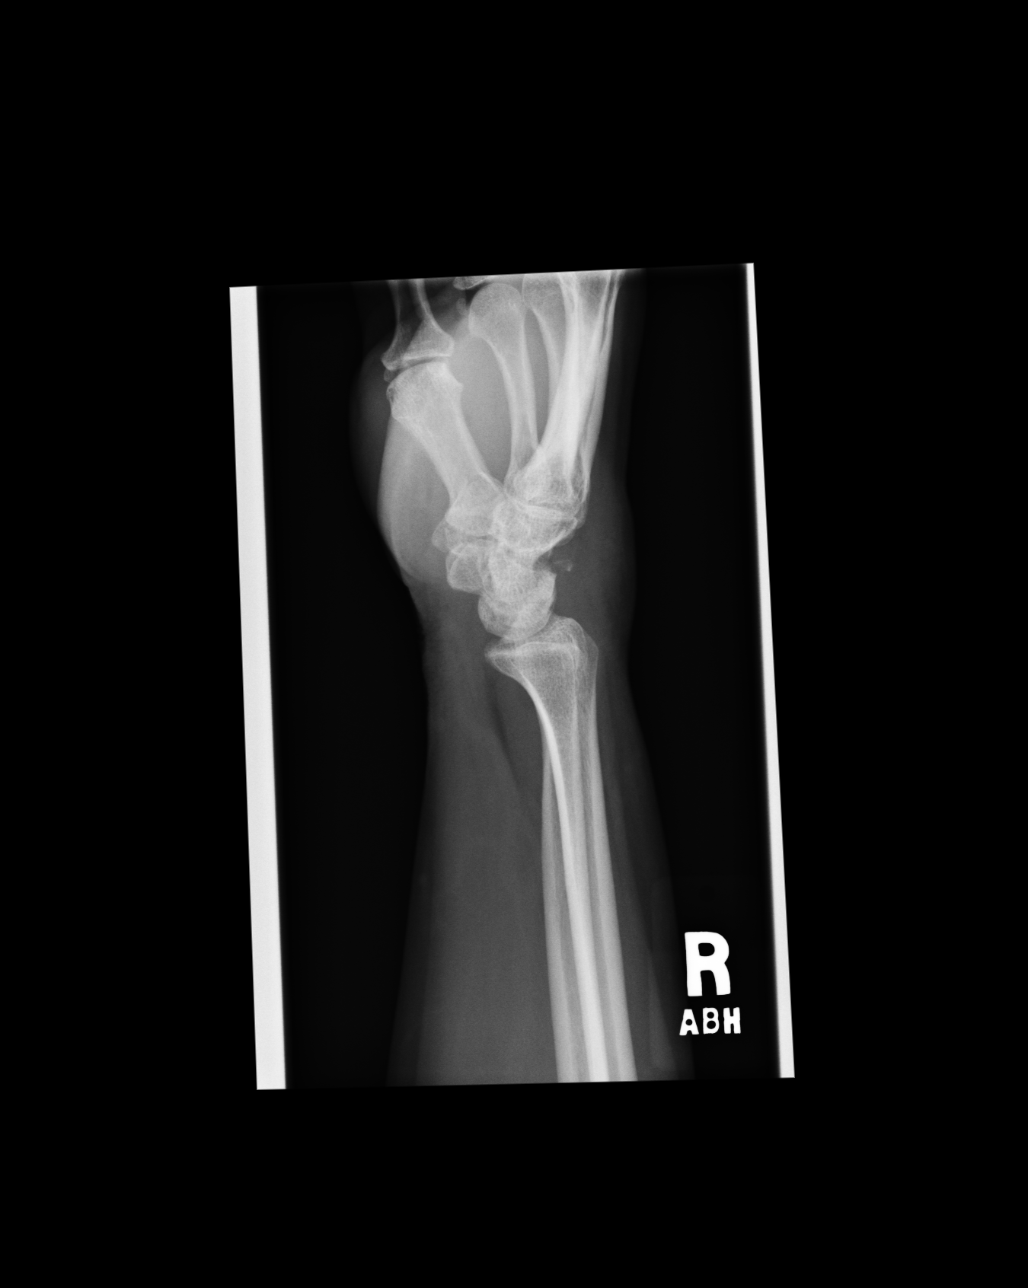

[4 of 4 positions shown; findings below may reference images not displayed]

IMPRESSION: Findings suggesting triquetral fracture.

## 2012-05-12 IMAGING — CR DG FOOT COMPLETE 3+V*L*
1 series · 3 of 3 positions shown · non-contrast
Comparison: none

REASON FOR EXAM: pain/ injury     Flex 6
COMMENTS:

[Series 1: view not recorded · 0.17mm/px · 3 of 3 slices shown]
[im 1/3]
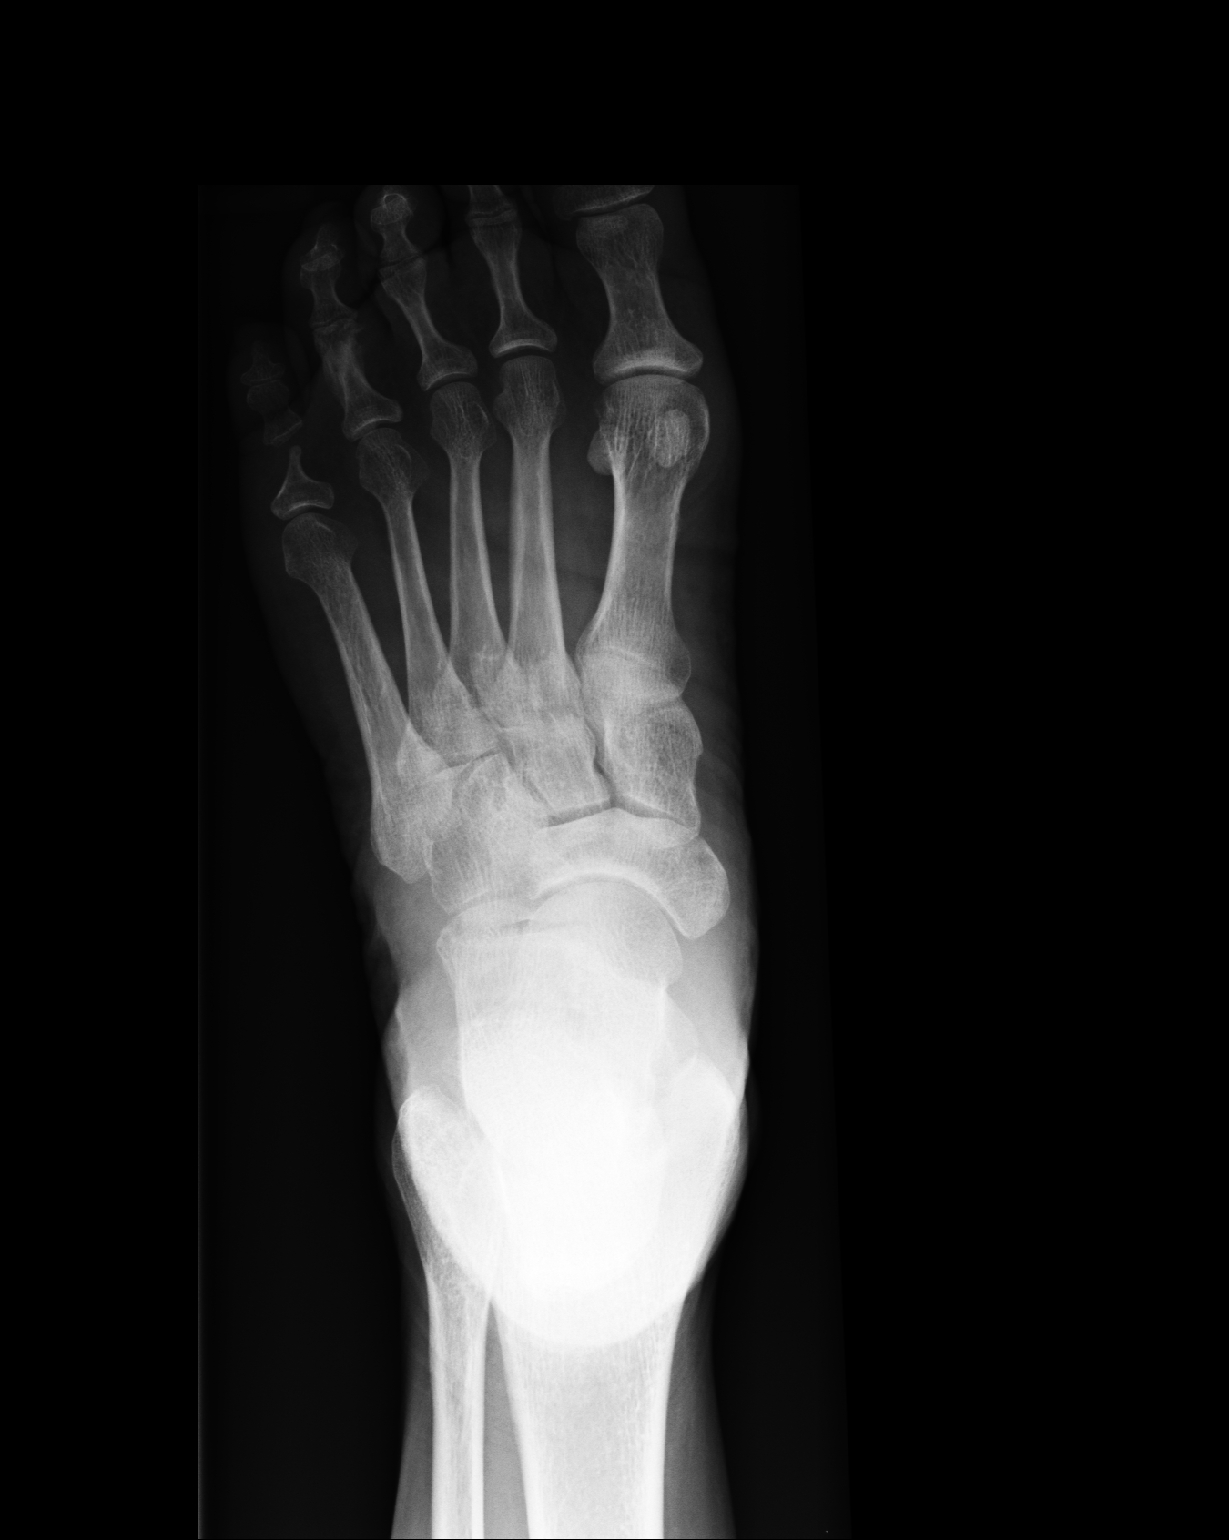
[im 2/3]
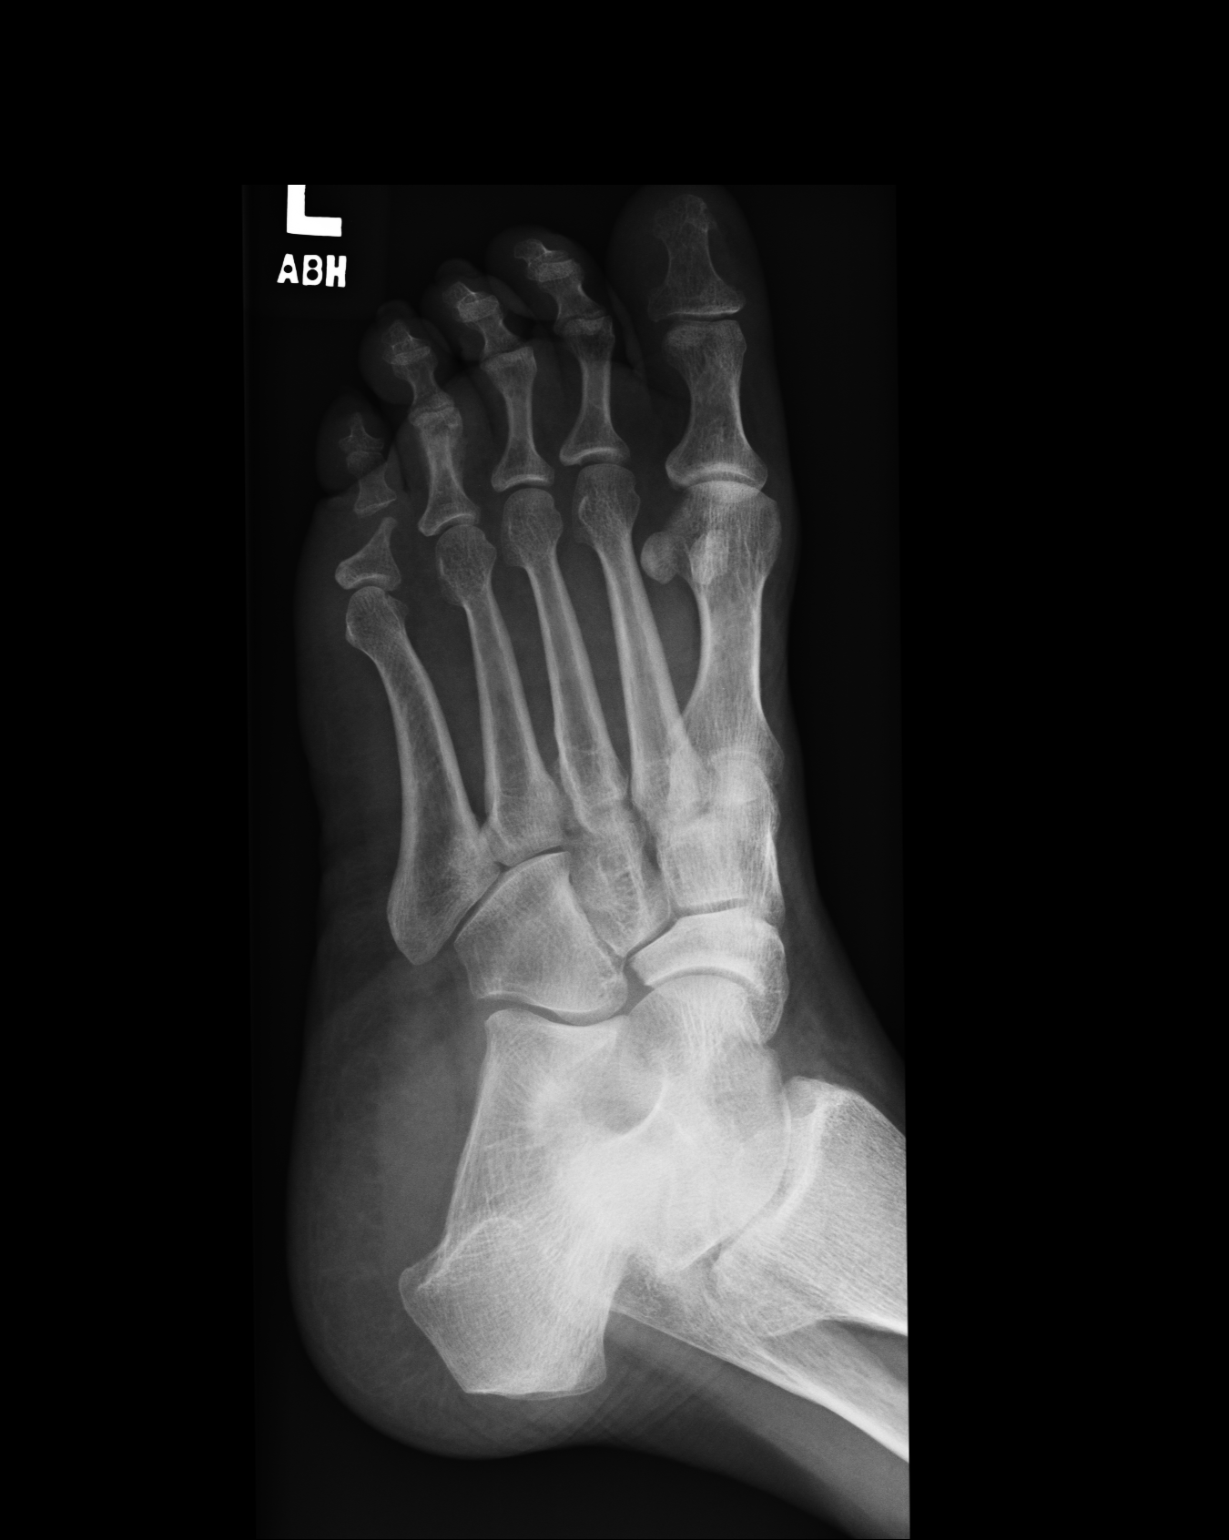
[im 3/3]
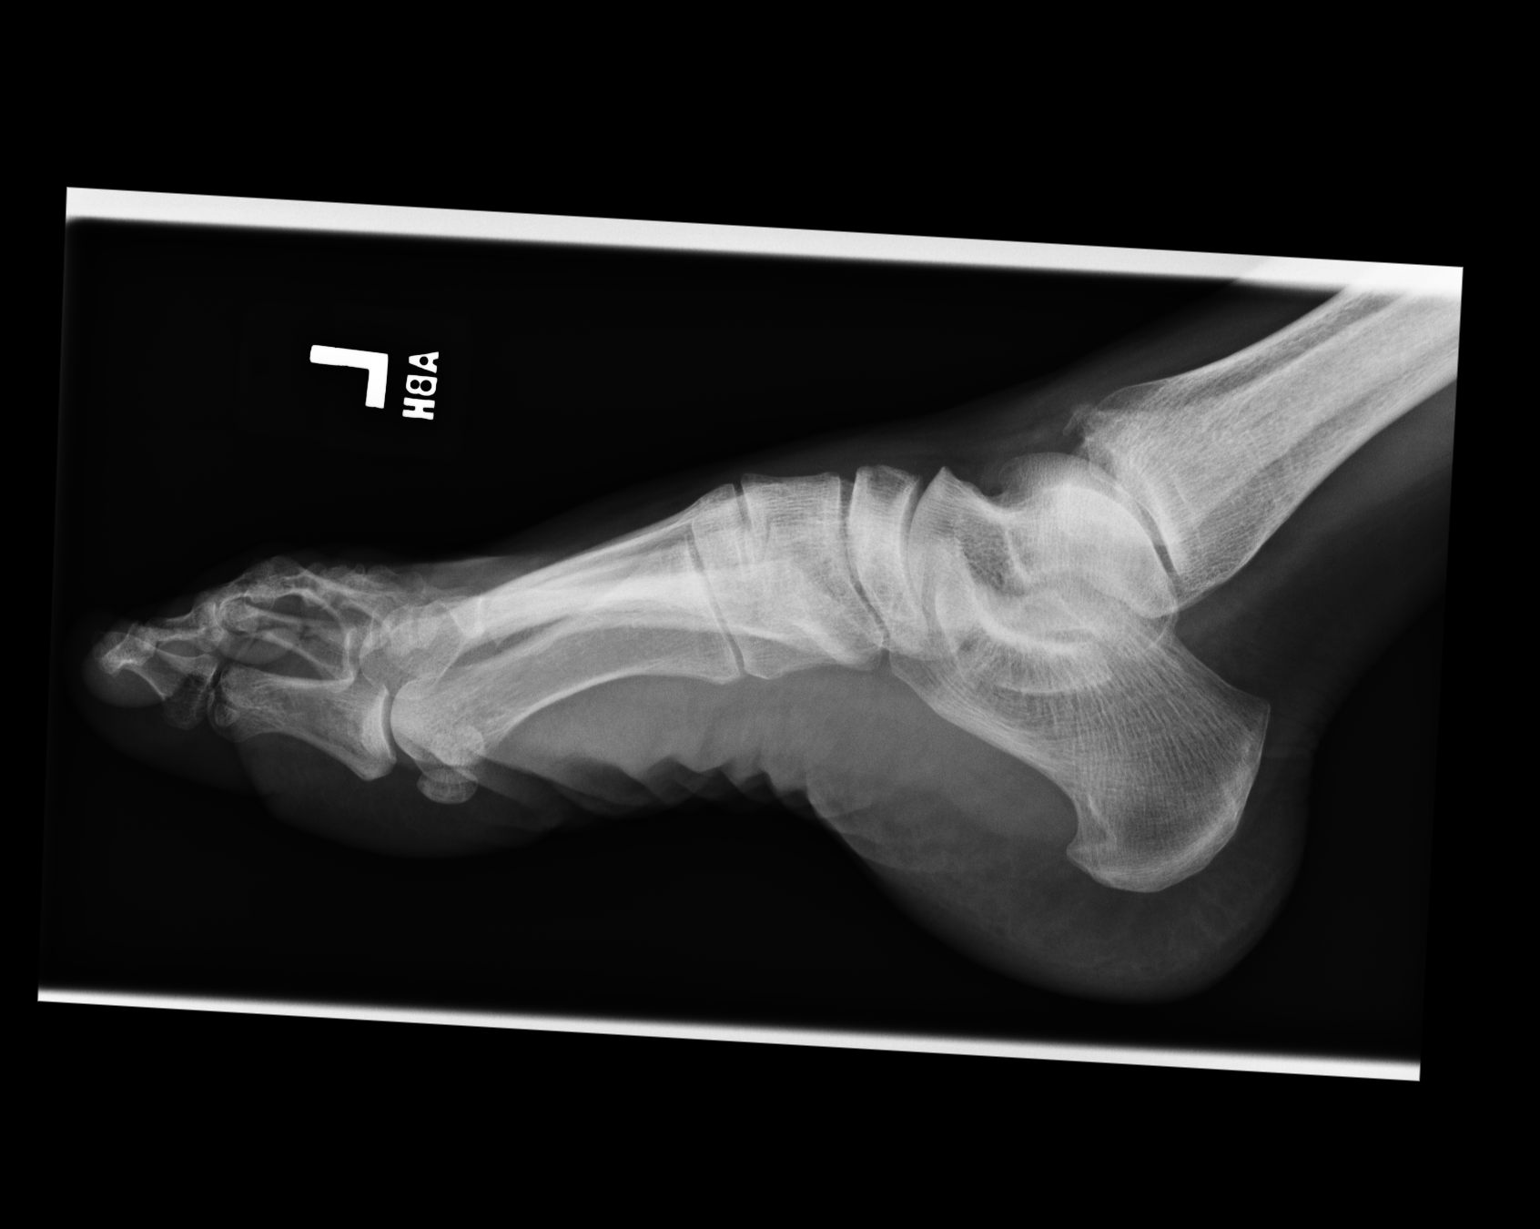

[3 of 3 positions shown; findings below may reference images not displayed]

PROCEDURE:     DXR - DXR FOOT LT COMP W/OBLIQUES  - June 02, 2010 [DATE]

RESULT:     Callus formation is noted about the proximal phalanx of the
left, fourth digit. This could be from prior trauma or infection. The left
proximal fifth phalanx is foreshortened and deformed. This could be
congenital from prior surgery. No acute abnormality identified.
IMPRESSION: Chronic findings as above. No acute abnormality identified.

## 2012-05-12 IMAGING — CR DG KNEE COMPLETE 4+V*L*
1 series · 4 of 4 positions shown · non-contrast
Comparison: none

REASON FOR EXAM: pain/ fall    Flex 6
COMMENTS:

PROCEDURE:     DXR - DXR KNEE LT COMP WITH OBLIQUES  - June 02, 2010 [DATE]
RESULT:     Degenerative changes are noted about the left knee. No evidence
of acute fracture. A small knee joint effusion cannot be excluded.

[Series 1: view not recorded · 0.17mm/px · 4 of 4 slices shown]
[im 1/4]
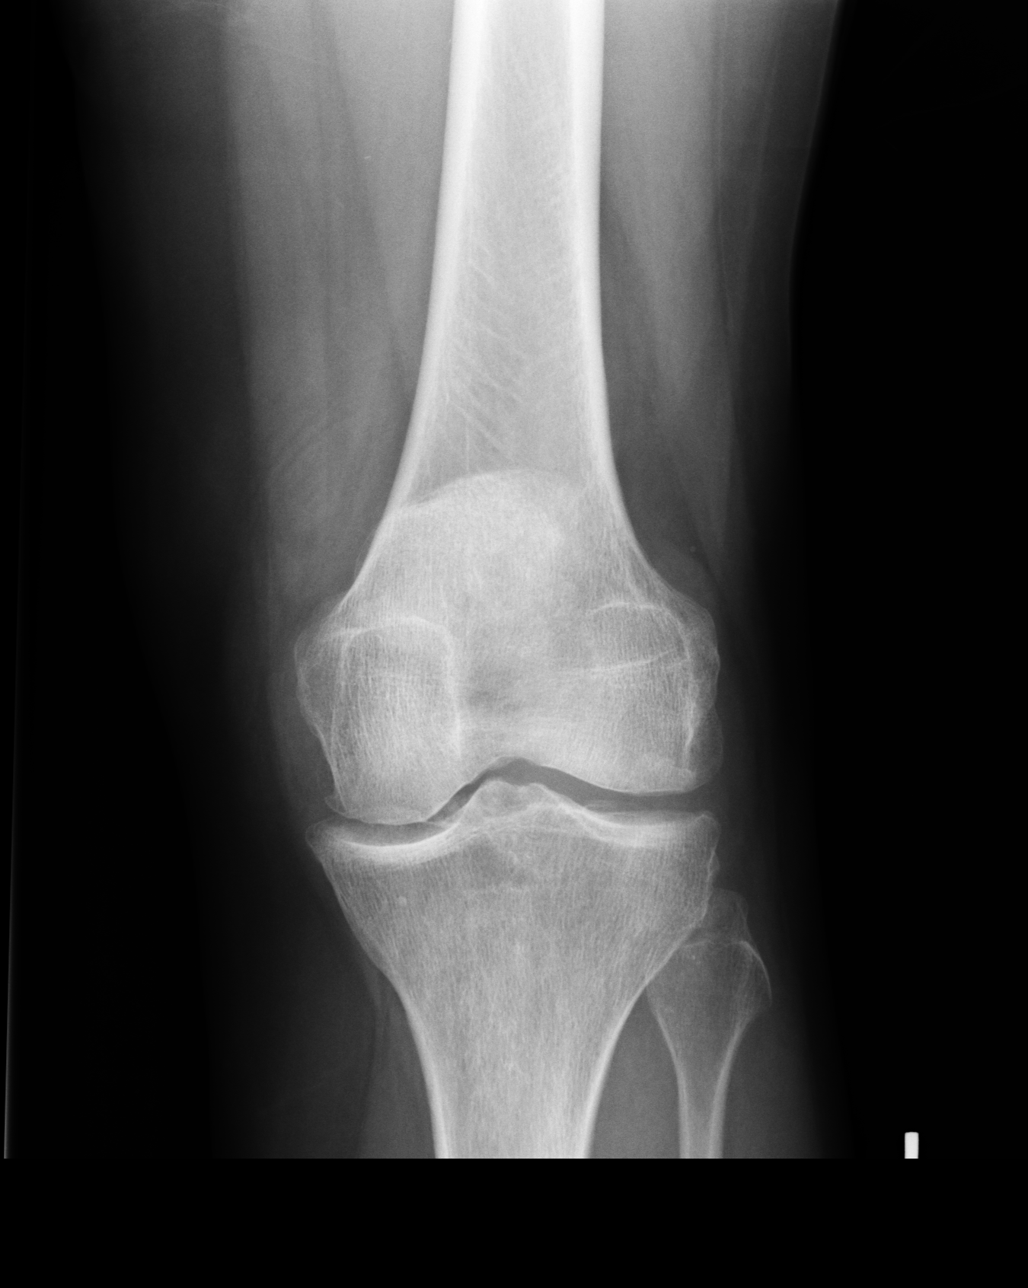
[im 2/4]
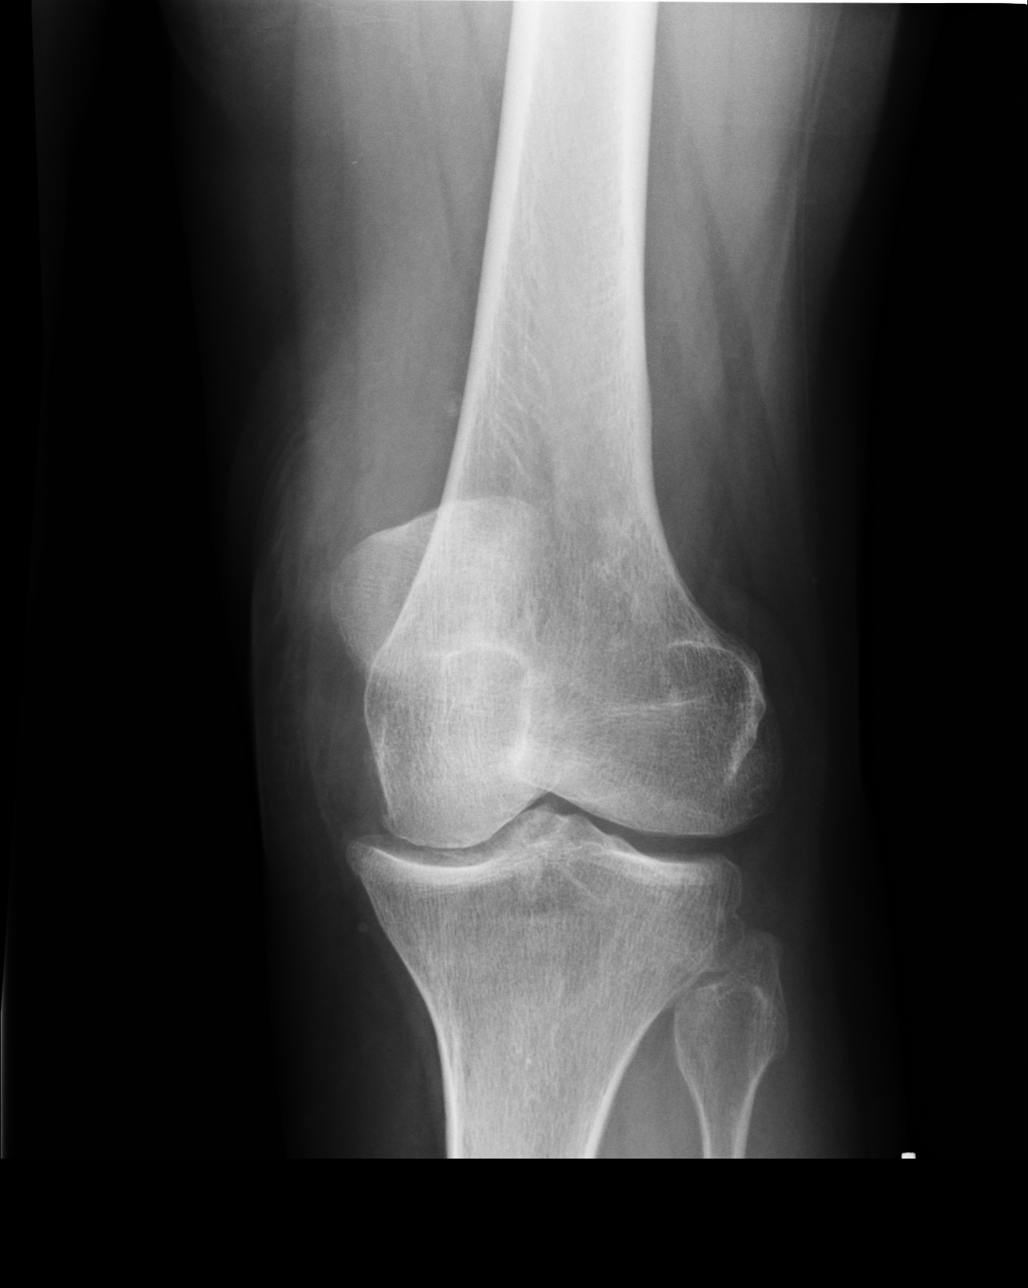
[im 3/4]
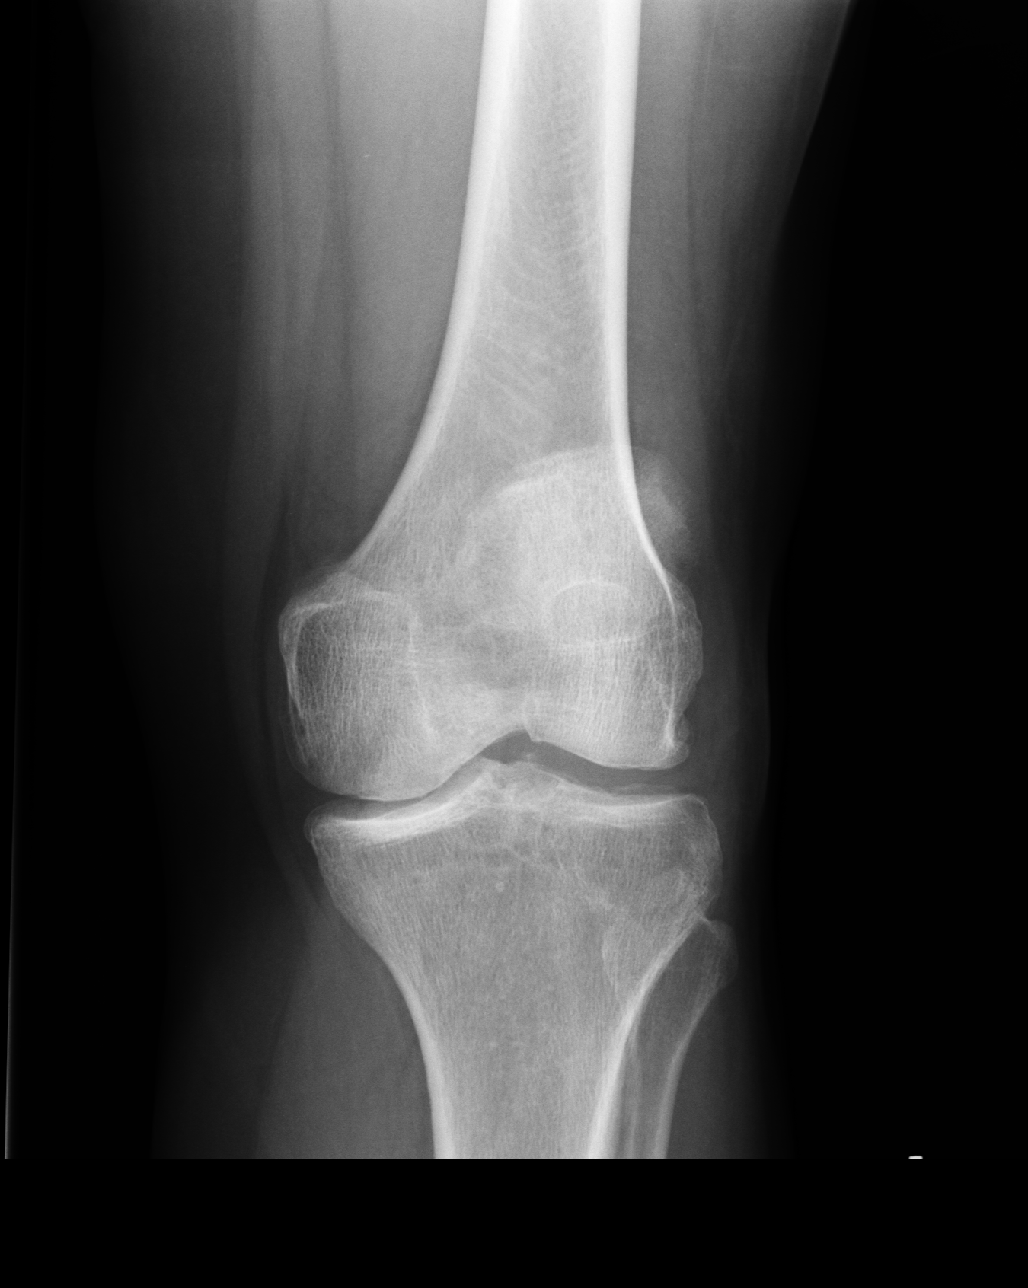
[im 4/4]
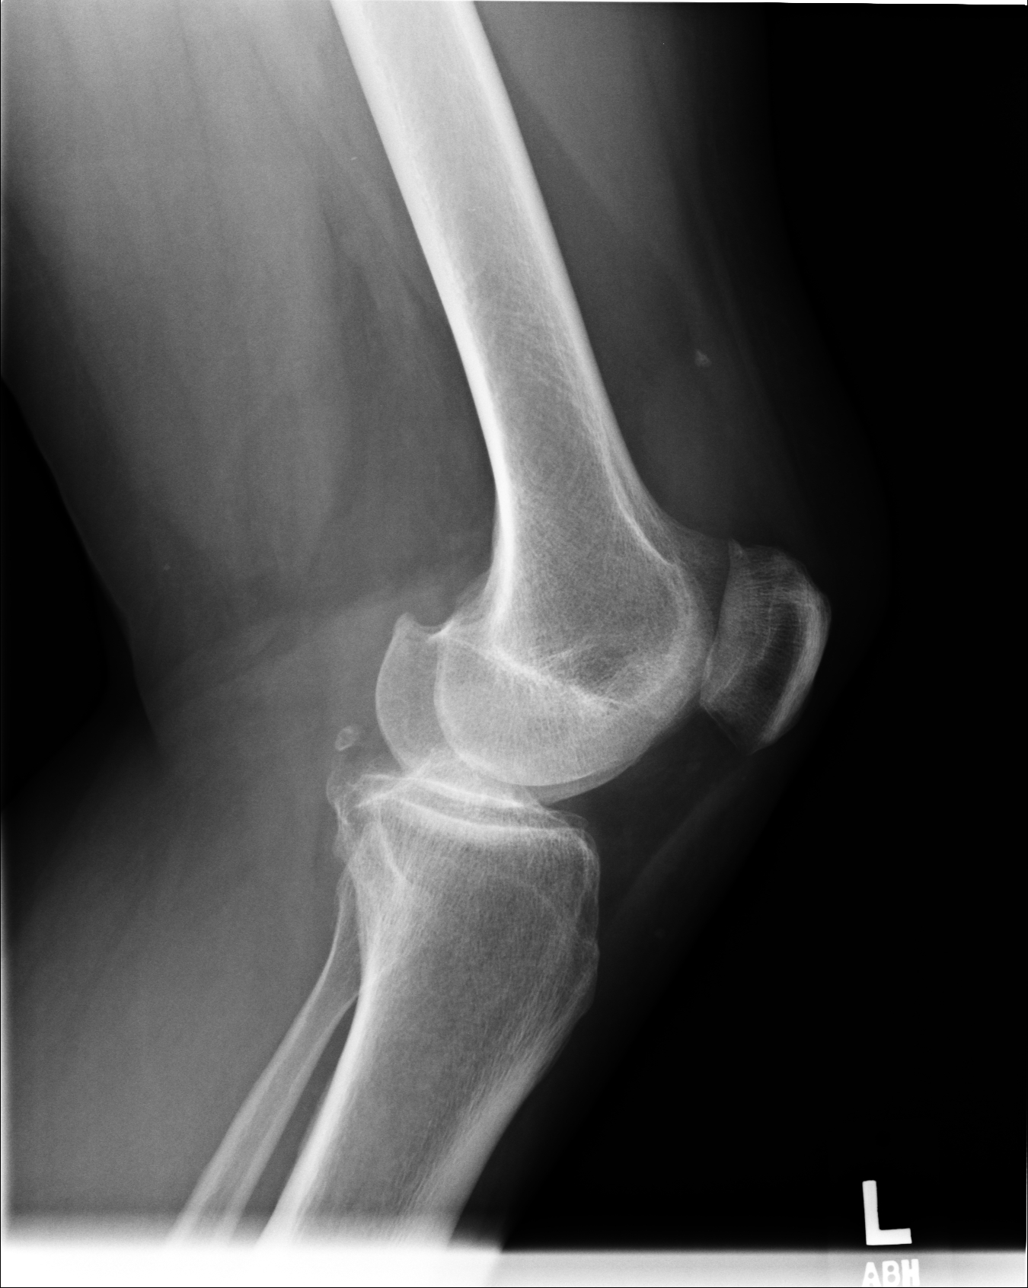

[4 of 4 positions shown; findings below may reference images not displayed]

IMPRESSION: Small knee joint effusion and degenerative changes, left
knee.

## 2012-07-05 IMAGING — CR DG CHEST 1V PORT
1 series · 1 of 1 positions shown · non-contrast
Comparison: none

REASON FOR EXAM: Chest Pain
COMMENTS:

[view not recorded]
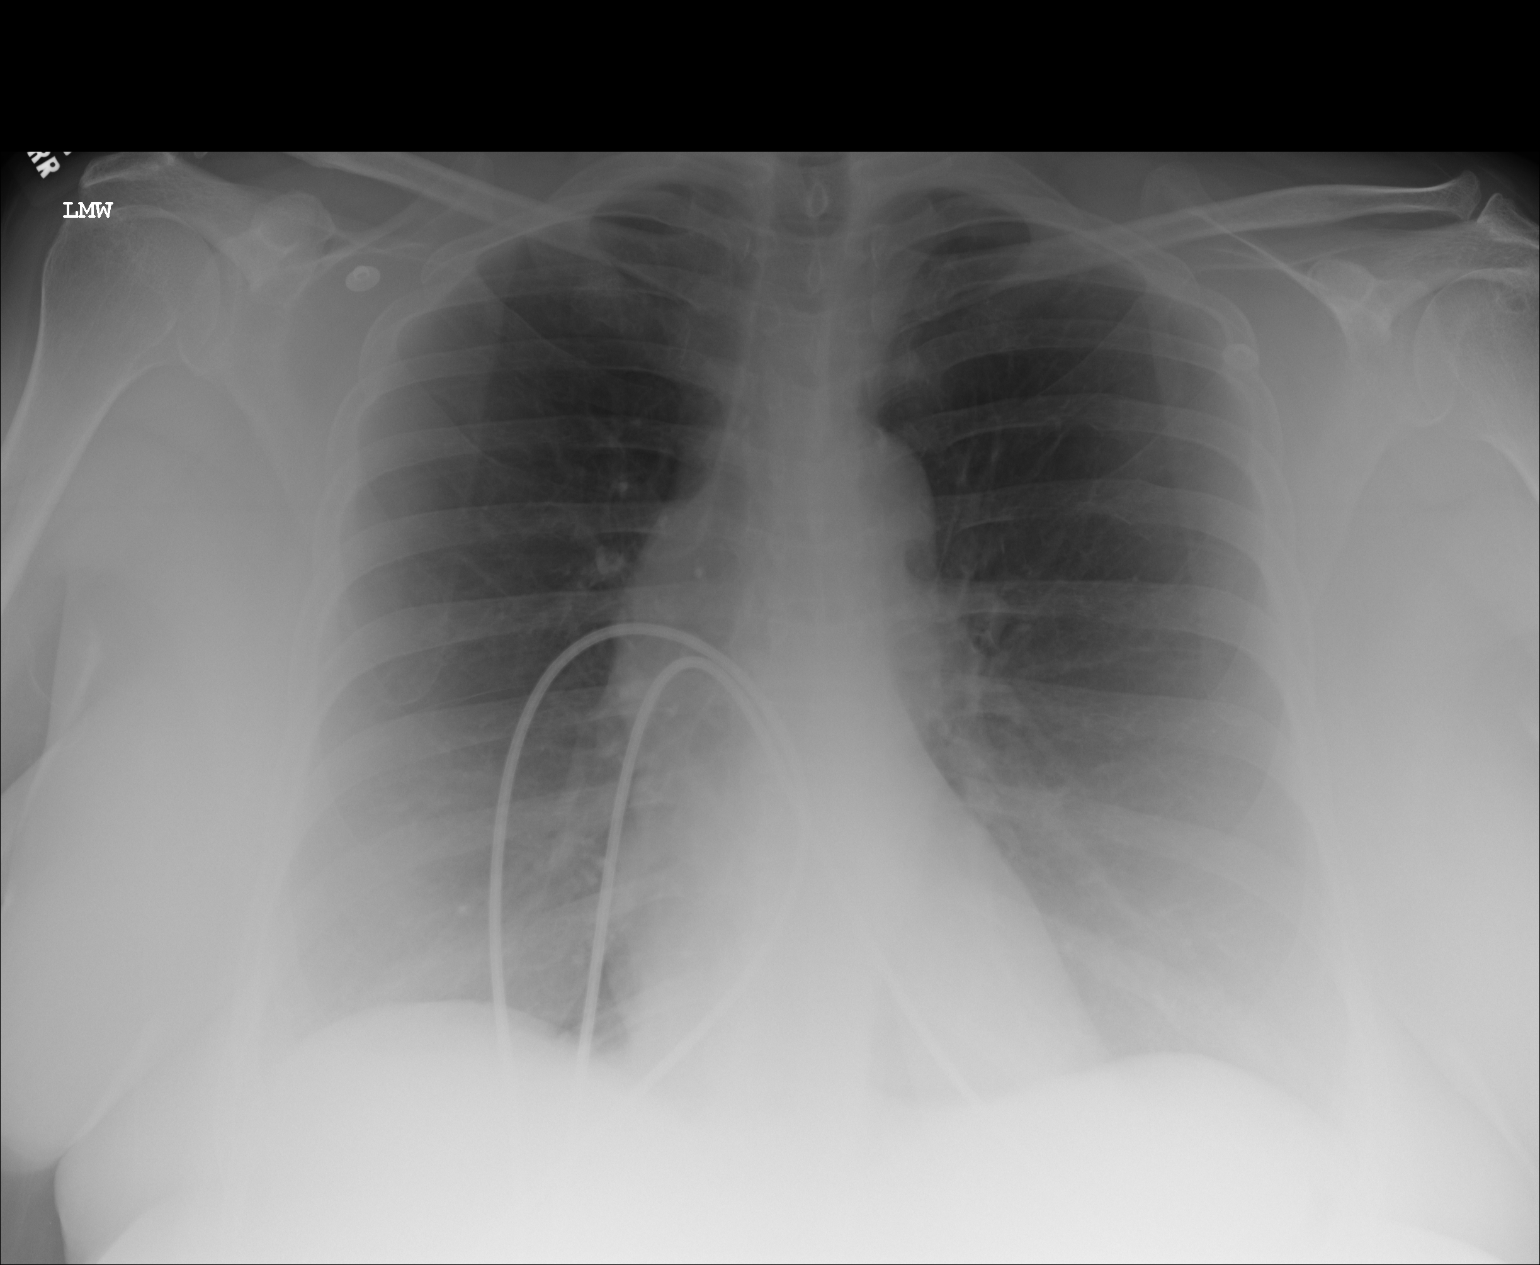

[1 of 1 positions shown; findings below may reference images not displayed]

PROCEDURE:     DXR - DXR PORTABLE CHEST SINGLE VIEW  - July 26, 2010 [DATE]

RESULT:     Comparison is made to the study 23 April, 2010.

The lungs remain hyperinflated. Fullness in the right hilar region is
present and stable. The cardiac silhouette is normal in size. There is mild
tortuosity of the descending thoracic aorta. I see no pleural effusion or
pneumothorax. The bony thorax is grossly intact where visualized. Old
fractures of the fifth and 6 ribs posteriorly are seen.
IMPRESSION: The findings are consistent with COPD or reactive airway
disease. I see no evidence of CHF nor of pneumonia. There is persistent
prominence of the right hilar structures but the findings are not changed
from the study April 2010. The right hilum is slightly more conspicuous
since the study 01 January, 2010.

## 2012-07-06 ENCOUNTER — Inpatient Hospital Stay: Payer: Self-pay | Admitting: Student

## 2012-07-06 LAB — COMPREHENSIVE METABOLIC PANEL
Albumin: 3.4 g/dL (ref 3.4–5.0)
Alkaline Phosphatase: 186 U/L — ABNORMAL HIGH (ref 50–136)
Anion Gap: 11 (ref 7–16)
Calcium, Total: 8.9 mg/dL (ref 8.5–10.1)
Chloride: 105 mmol/L (ref 98–107)
Co2: 21 mmol/L (ref 21–32)
Creatinine: 0.78 mg/dL (ref 0.60–1.30)
EGFR (African American): >60
EGFR (Non-African Amer.): >60
Glucose: 118 mg/dL — ABNORMAL HIGH (ref 65–99)
Osmolality: 272 (ref 275–301)
SGOT(AST): 112 U/L — ABNORMAL HIGH (ref 15–37)
SGPT (ALT): 67 U/L (ref 12–78)

## 2012-07-06 LAB — CBC
HCT: 36.5 % (ref 35.0–47.0)
HGB: 11.8 g/dL — ABNORMAL LOW (ref 12.0–16.0)
MCH: 29.5 pg (ref 26.0–34.0)
MCHC: 32.3 g/dL (ref 32.0–36.0)
MCV: 92 fL (ref 80–100)
Platelet: 158 10*3/uL (ref 150–440)

## 2012-07-06 LAB — URINALYSIS, COMPLETE
Nitrite: NEGATIVE
Ph: 6 (ref 4.5–8.0)
Protein: NEGATIVE
Specific Gravity: 1.006 (ref 1.003–1.030)
WBC UR: <1 /HPF (ref 0–5)

## 2012-07-06 LAB — LIPASE, BLOOD: Lipase: 1241 U/L — ABNORMAL HIGH (ref 73–393)

## 2012-07-07 LAB — CBC WITH DIFFERENTIAL/PLATELET
Basophil %: 0.1 %
Eosinophil #: 0 10*3/uL (ref 0.0–0.7)
Eosinophil %: 0 %
HGB: 11.9 g/dL — ABNORMAL LOW (ref 12.0–16.0)
Lymphocyte #: 0.7 10*3/uL — ABNORMAL LOW (ref 1.0–3.6)
Lymphocyte %: 17.2 %
MCH: 30.3 pg (ref 26.0–34.0)
MCV: 93 fL (ref 80–100)
Monocyte #: 0.1 x10 3/mm — ABNORMAL LOW (ref 0.2–0.9)
Neutrophil %: 81.3 %
Platelet: 148 10*3/uL — ABNORMAL LOW (ref 150–440)
RBC: 3.93 10*6/uL (ref 3.80–5.20)
WBC: 3.9 10*3/uL (ref 3.6–11.0)

## 2012-07-07 LAB — LIPID PANEL
Cholesterol: 183 mg/dL (ref 0–200)
HDL Cholesterol: 36 mg/dL — ABNORMAL LOW (ref 40–60)
Ldl Cholesterol, Calc: 132 mg/dL — ABNORMAL HIGH (ref 0–100)
Triglycerides: 76 mg/dL (ref 0–200)
VLDL Cholesterol, Calc: 15 mg/dL (ref 5–40)

## 2012-07-07 LAB — COMPREHENSIVE METABOLIC PANEL
Albumin: 3.3 g/dL — ABNORMAL LOW (ref 3.4–5.0)
Alkaline Phosphatase: 184 U/L — ABNORMAL HIGH (ref 50–136)
Anion Gap: 7 (ref 7–16)
BUN: 5 mg/dL — ABNORMAL LOW (ref 7–18)
Calcium, Total: 8.8 mg/dL (ref 8.5–10.1)
EGFR (Non-African Amer.): >60
Glucose: 161 mg/dL — ABNORMAL HIGH (ref 65–99)
Osmolality: 280 (ref 275–301)
Potassium: 4 mmol/L (ref 3.5–5.1)
SGOT(AST): 85 U/L — ABNORMAL HIGH (ref 15–37)
SGPT (ALT): 59 U/L (ref 12–78)

## 2012-07-07 LAB — AMYLASE: Amylase: 87 U/L (ref 25–115)

## 2012-07-07 LAB — LIPASE, BLOOD: Lipase: 768 U/L — ABNORMAL HIGH (ref 73–393)

## 2012-07-09 LAB — BASIC METABOLIC PANEL
Anion Gap: 9 (ref 7–16)
BUN: 5 mg/dL — ABNORMAL LOW (ref 7–18)
Chloride: 106 mmol/L (ref 98–107)
EGFR (African American): >60
EGFR (Non-African Amer.): >60
Osmolality: 275 (ref 275–301)
Sodium: 139 mmol/L (ref 136–145)

## 2012-07-09 LAB — CBC WITH DIFFERENTIAL/PLATELET
Basophil #: 0 10*3/uL (ref 0.0–0.1)
Basophil %: 0.4 %
Eosinophil #: 0.2 10*3/uL (ref 0.0–0.7)
Eosinophil %: 2.8 %
HCT: 35.2 % (ref 35.0–47.0)
HGB: 11.5 g/dL — ABNORMAL LOW (ref 12.0–16.0)
Lymphocyte #: 2.3 10*3/uL (ref 1.0–3.6)
Lymphocyte %: 42 %
MCH: 30.3 pg (ref 26.0–34.0)
MCHC: 32.8 g/dL (ref 32.0–36.0)
MCV: 92 fL (ref 80–100)
Monocyte #: 0.7 x10 3/mm (ref 0.2–0.9)
Monocyte %: 12.3 %
Neutrophil #: 2.4 10*3/uL (ref 1.4–6.5)
Platelet: 127 10*3/uL — ABNORMAL LOW (ref 150–440)
WBC: 5.6 10*3/uL (ref 3.6–11.0)

## 2012-07-09 LAB — LIPASE, BLOOD: Lipase: 750 U/L — ABNORMAL HIGH (ref 73–393)

## 2012-07-10 LAB — CLOSTRIDIUM DIFFICILE BY PCR

## 2012-07-10 LAB — LIPASE, BLOOD: Lipase: 694 U/L — ABNORMAL HIGH (ref 73–393)

## 2012-07-11 LAB — LIPASE, BLOOD: Lipase: 1370 U/L — ABNORMAL HIGH (ref 73–393)

## 2012-07-12 LAB — COMPREHENSIVE METABOLIC PANEL
BUN: 3 mg/dL — ABNORMAL LOW (ref 7–18)
Bilirubin,Total: 0.6 mg/dL (ref 0.2–1.0)
Calcium, Total: 8.3 mg/dL — ABNORMAL LOW (ref 8.5–10.1)
Co2: 26 mmol/L (ref 21–32)
Creatinine: 0.83 mg/dL (ref 0.60–1.30)
EGFR (African American): >60
EGFR (Non-African Amer.): >60
Glucose: 85 mg/dL (ref 65–99)
Potassium: 3.8 mmol/L (ref 3.5–5.1)

## 2012-07-13 LAB — COMPREHENSIVE METABOLIC PANEL
Alkaline Phosphatase: 131 U/L (ref 50–136)
BUN: 5 mg/dL — ABNORMAL LOW (ref 7–18)
Bilirubin,Total: 0.7 mg/dL (ref 0.2–1.0)
Calcium, Total: 8.4 mg/dL — ABNORMAL LOW (ref 8.5–10.1)
Chloride: 107 mmol/L (ref 98–107)
Co2: 26 mmol/L (ref 21–32)
Creatinine: 0.84 mg/dL (ref 0.60–1.30)
EGFR (Non-African Amer.): >60
SGOT(AST): 51 U/L — ABNORMAL HIGH (ref 15–37)
Sodium: 142 mmol/L (ref 136–145)
Total Protein: 7.4 g/dL (ref 6.4–8.2)

## 2012-07-13 IMAGING — CR DG CHEST 2V
1 series · 3 of 3 positions shown · non-contrast
Comparison: none

REASON FOR EXAM: chest pain
COMMENTS:   May transport without cardiac monitor

[Series 1: view not recorded · 0.17mm/px · 3 of 3 slices shown]
[im 1/3]
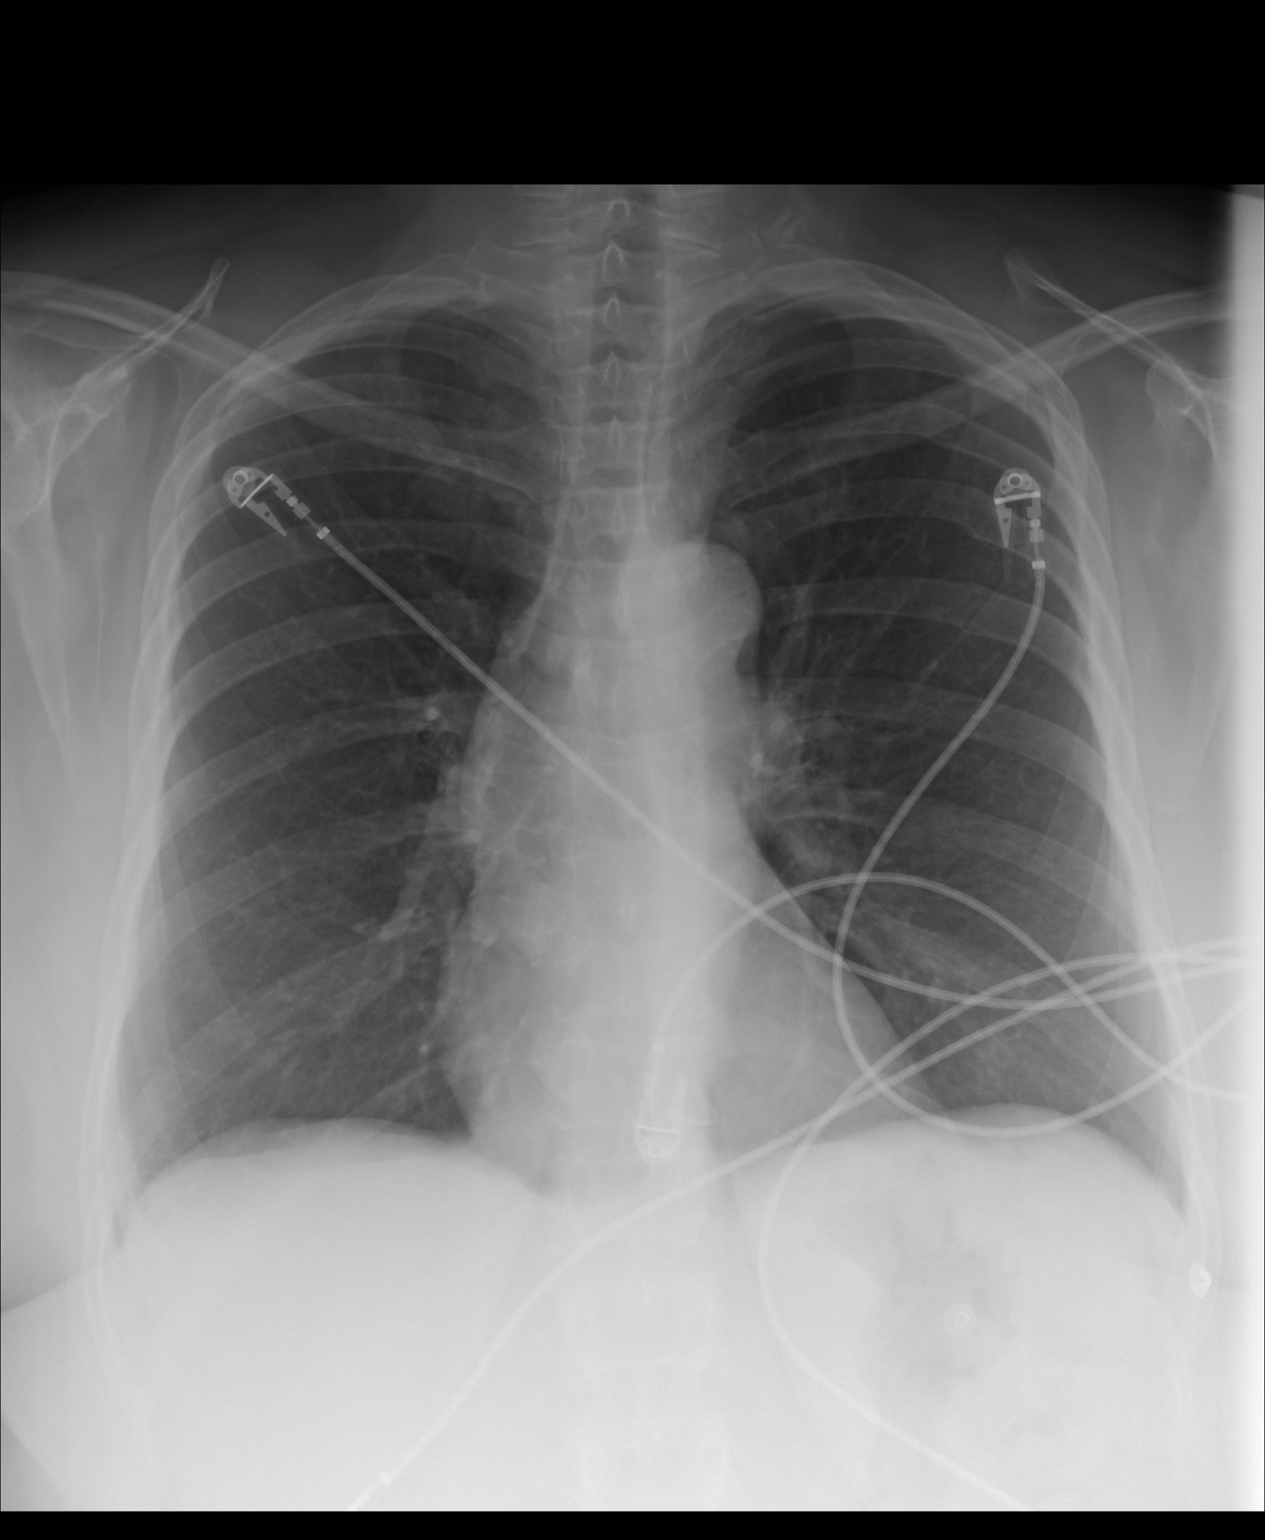
[im 2/3]
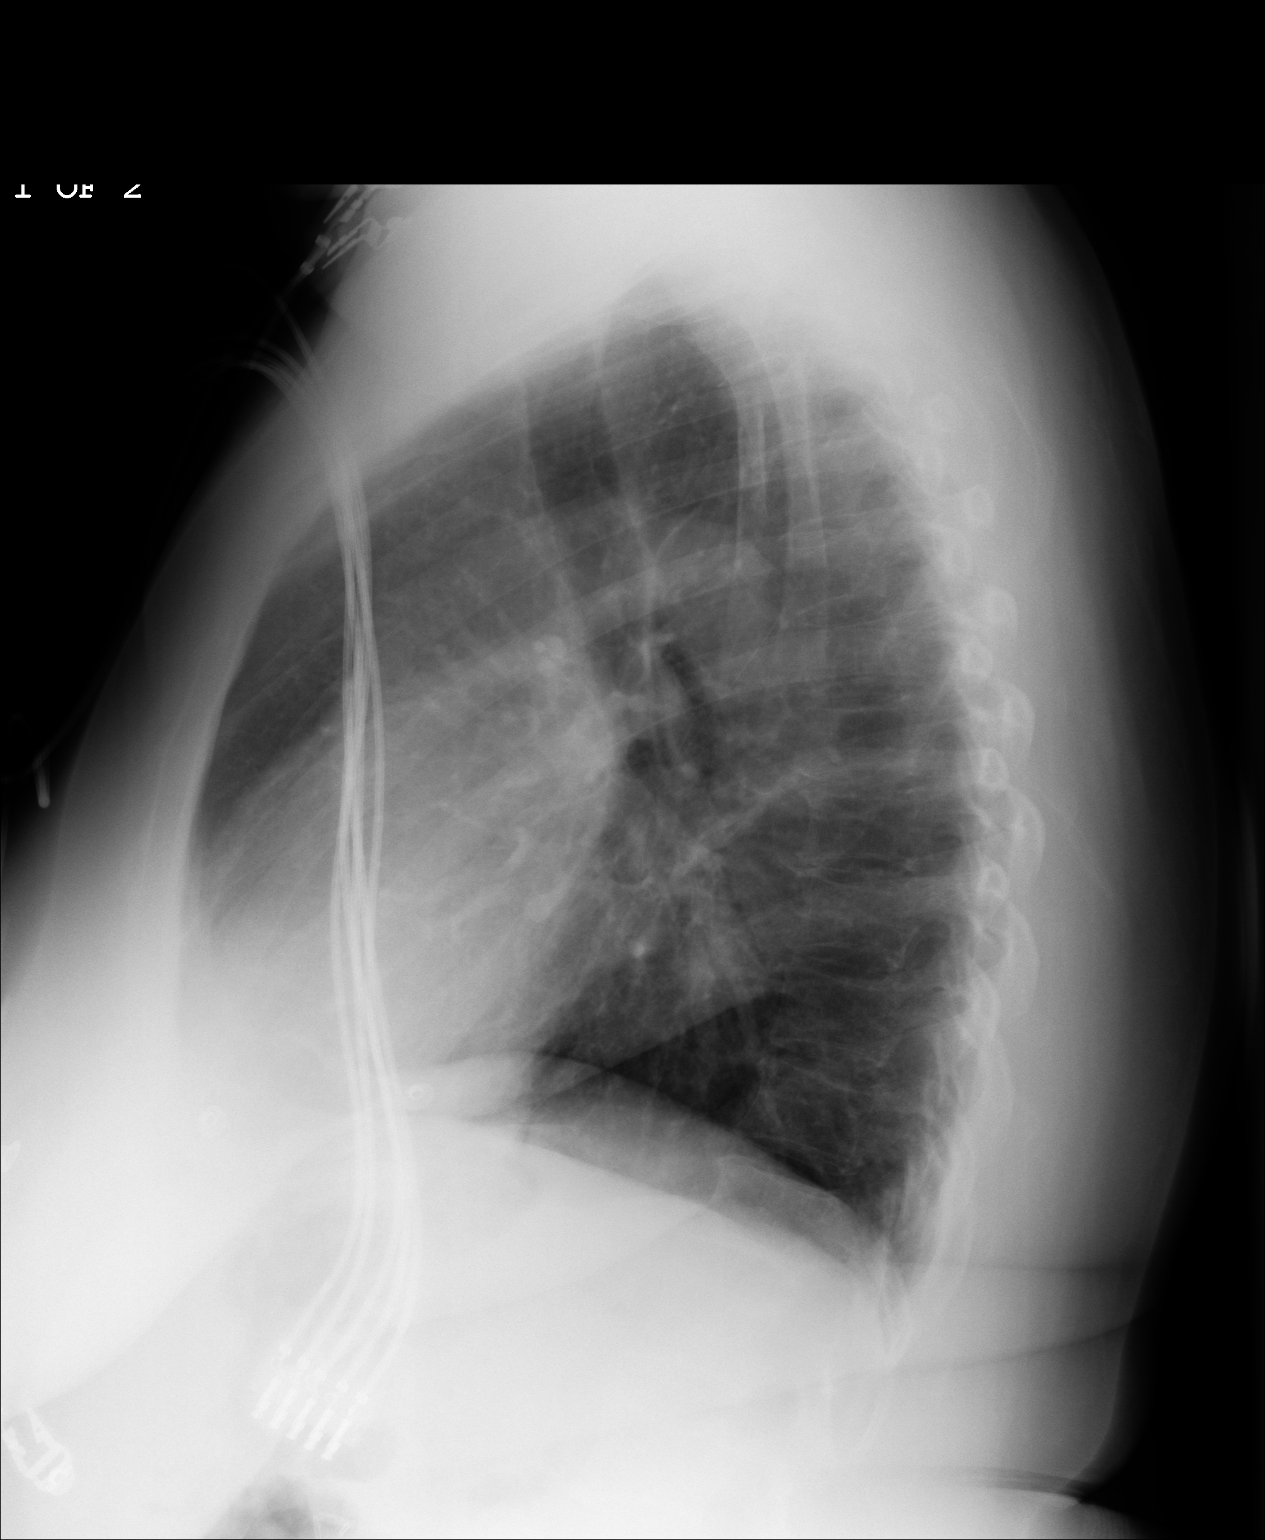
[im 3/3]
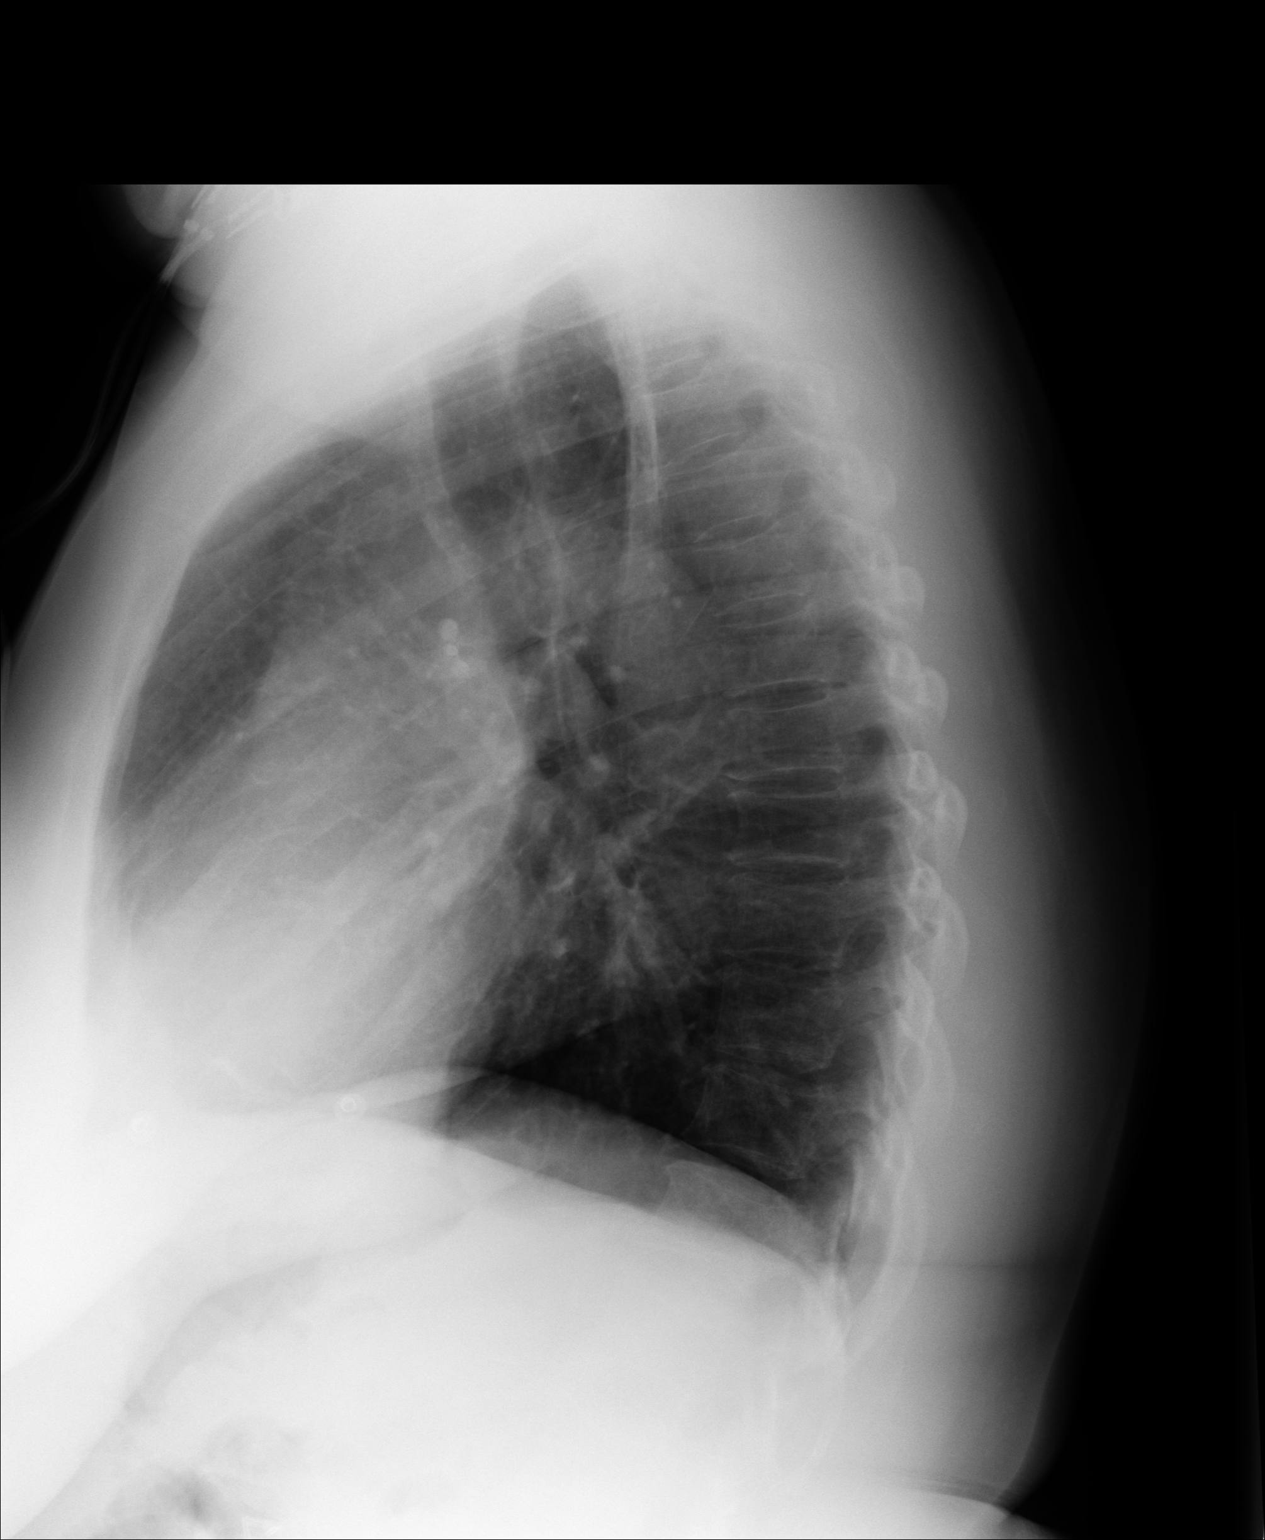

[3 of 3 positions shown; findings below may reference images not displayed]

PROCEDURE:     DXR - DXR CHEST PA (OR AP) AND LATERAL  - August 03, 2010  [DATE]

RESULT:     Comparison is made to a prior exam of 07/26/2010.

The lung fields are clear. No pneumonia, pneumothorax or pleural effusion is
seen. The heart size is normal. Multiple, old, healed rib fracture
deformities are noted on left.
IMPRESSION: No acute changes are identified.

## 2012-07-13 IMAGING — CT CT HEAD WITHOUT CONTRAST
2 series · 15 of 30 positions shown, 19 images · non-contrast
Comparison: none

REASON FOR EXAM: syncope
COMMENTS:

PROCEDURE:     CT  - CT HEAD WITHOUT CONTRAST  - August 03, 2010  [DATE]
RESULT:     Head CT dated 08/03/2010 comparison made to prior study dated
04/23/2010.
TECHNIQUE: Helical noncontrasted 5 mm sections were obtained from skull base
to the vertex.

[Series 2: without · axial · non-contrast · 0.44mm/px · z∈[-162,-37]mm · 13 of 31 slices shown, 17 images]
[im 3/31  brain]
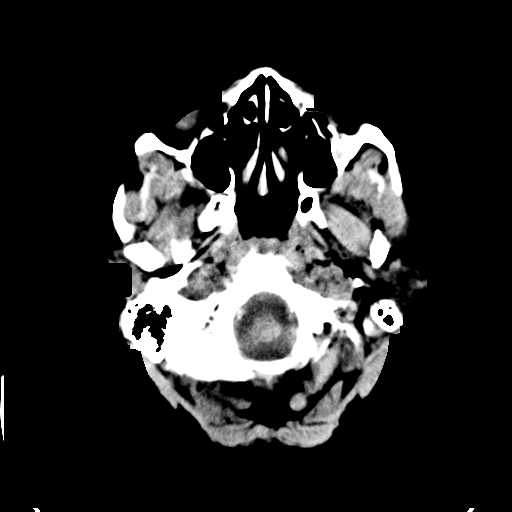
[im 3/31  bone]
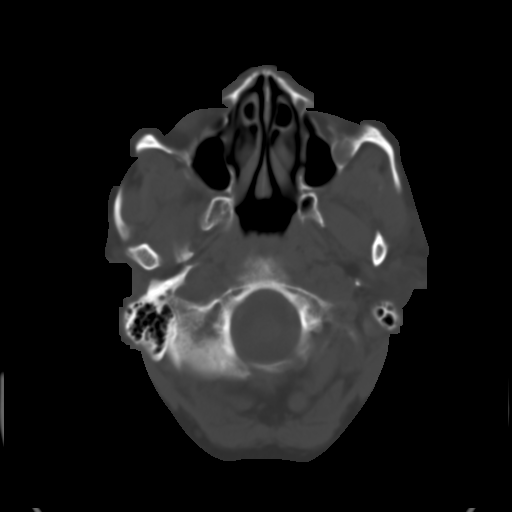
[im 5/31  brain]
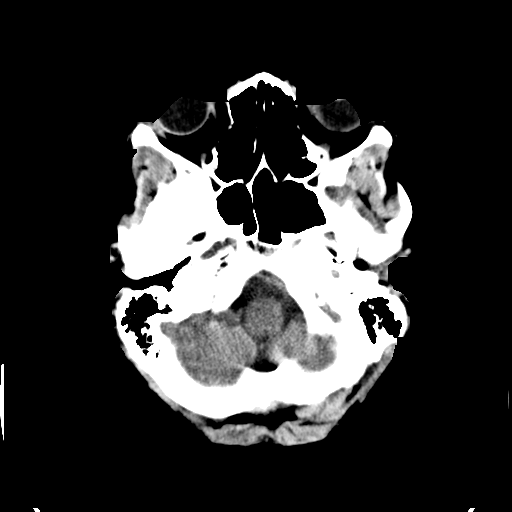
[im 7/31  brain]
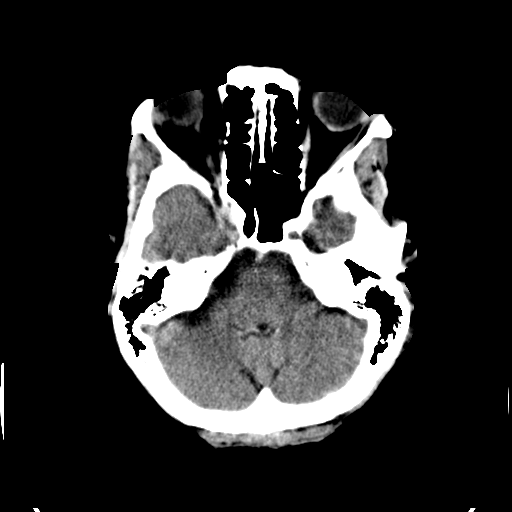
[im 9/31  brain]
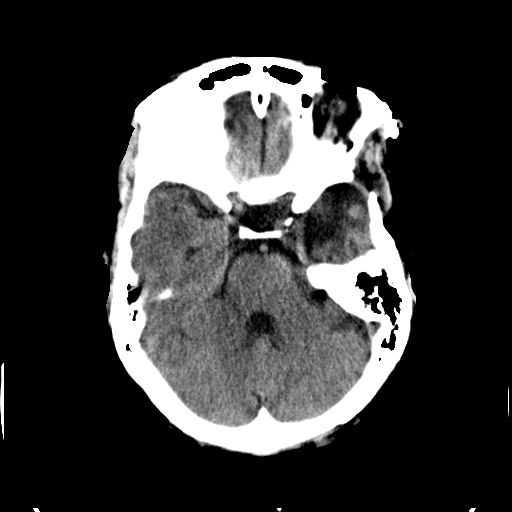
[im 11/31  brain]
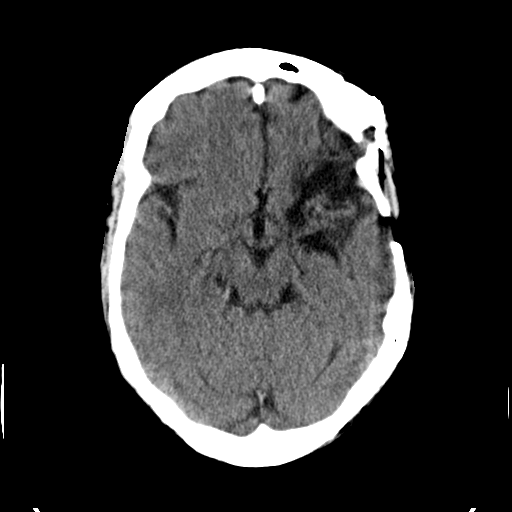
[im 11/31  bone]
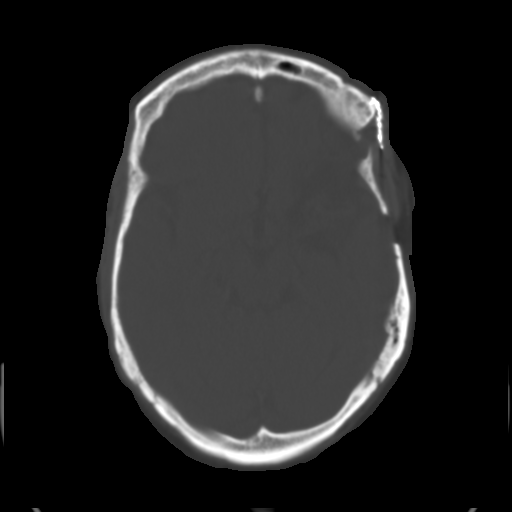
[im 13/31  brain]
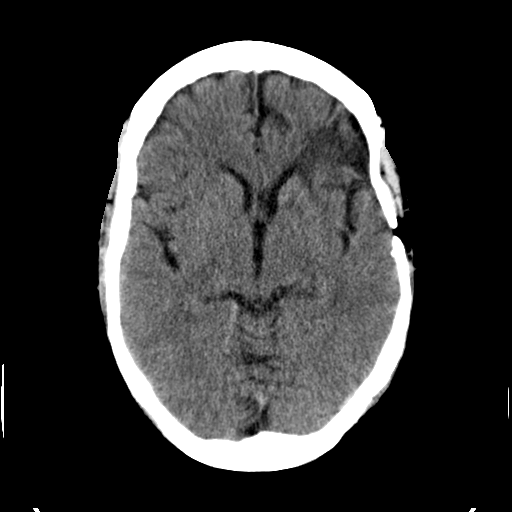
[im 16/31  brain]
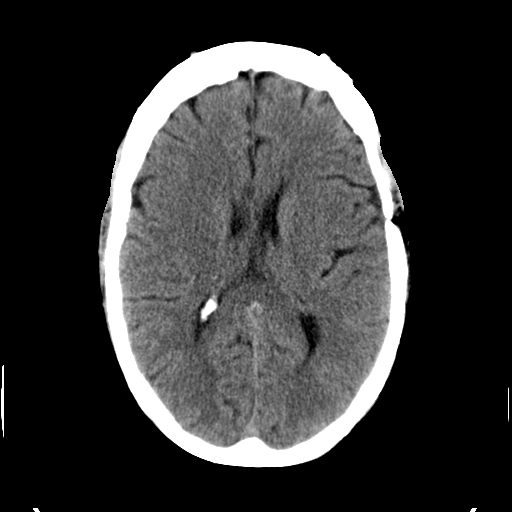
[im 18/31  brain]
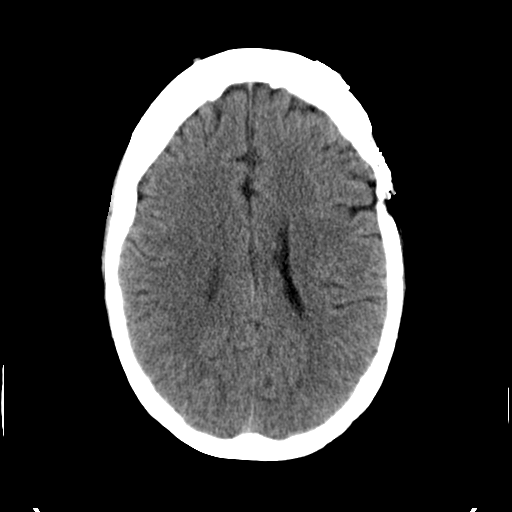
[im 20/31  brain]
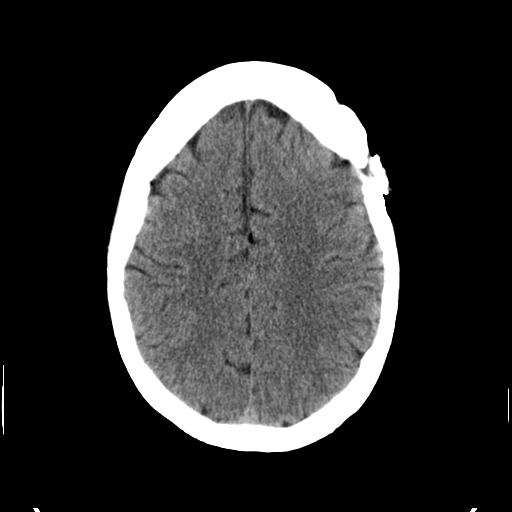
[im 20/31  bone]
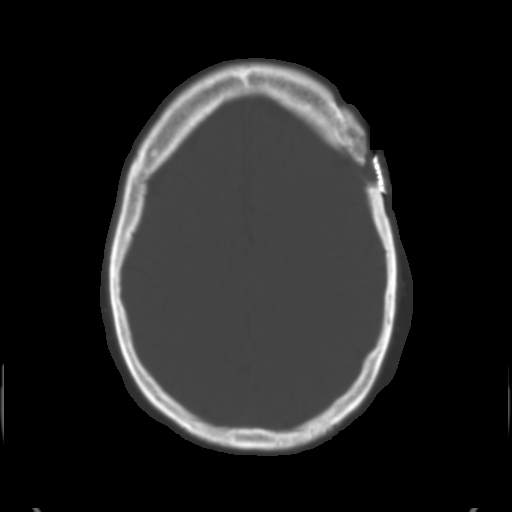
[im 22/31  brain]
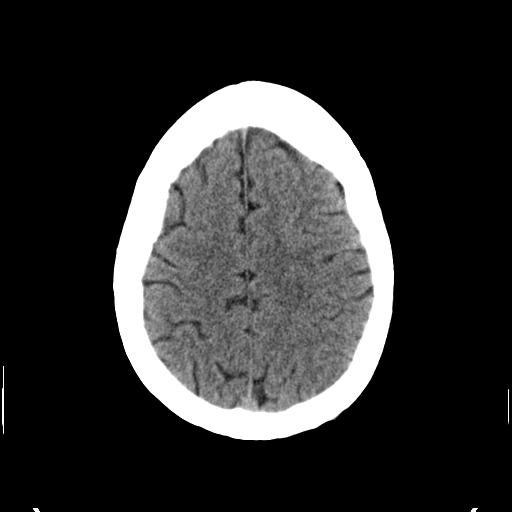
[im 24/31  brain]
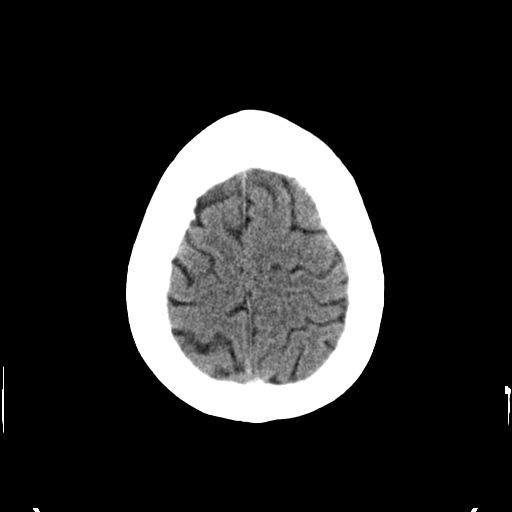
[im 26/31  brain]
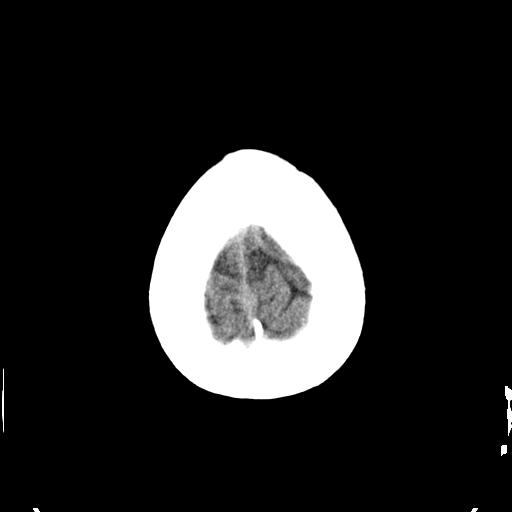
[im 28/31  brain]
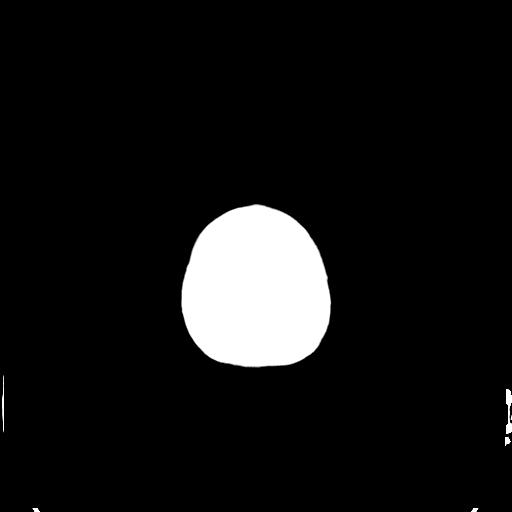
[im 28/31  bone]
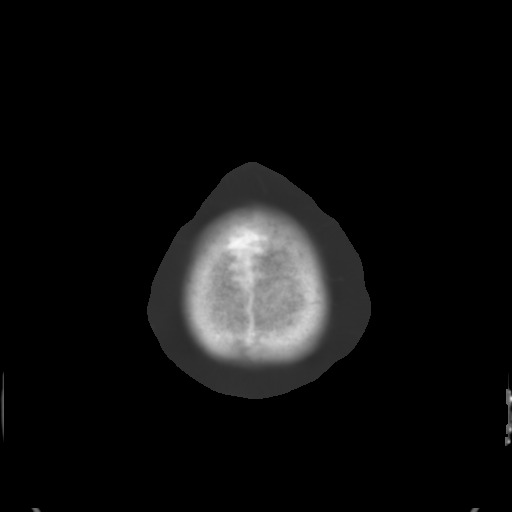

[Series 3: bone · axial · 0.44mm/px · z∈[-162,-142]mm · 2 of 31 slices shown]
[im 3/31  bone]
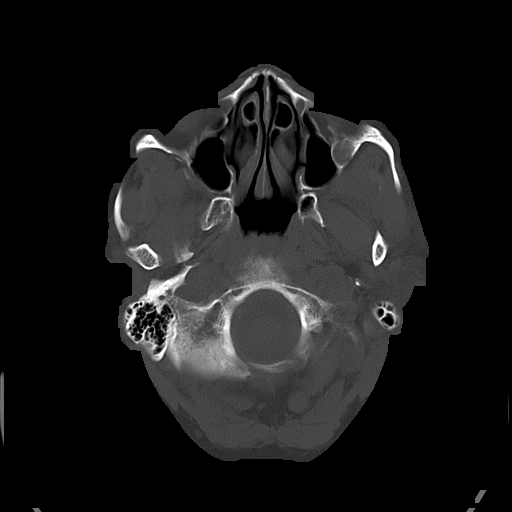
[im 7/31  bone]
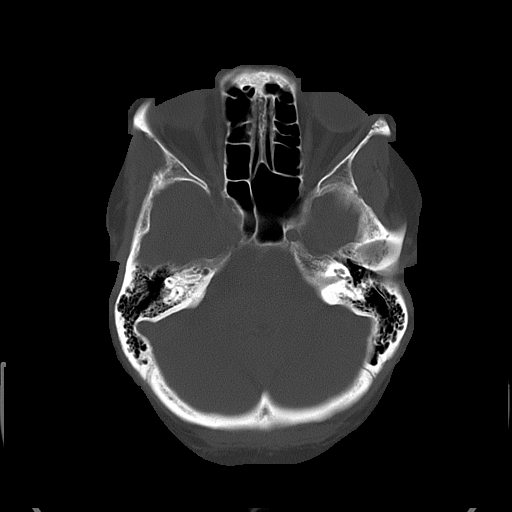

[15 of 30 positions shown; findings below may reference images not displayed]

FINDINGS: Postsurgical changes are once again appreciated within the lateral
base of the left frontal lobe. When compared to previous study these
findings are stable. There is no evidence of acute hemorrhage. No further
regions of intra-axial nor extra-axial fluid collection identified.
Encephalomalacic changes project within the postsurgical bed. There is no
evidence of mass effect. The ventricles and cisterns are patent. The pons
and cerebellum are grossly unremarkable. There is no evidence of a depressed
skull fracture.
IMPRESSION: Postsurgical changes within the left frontal region
extending into the temporal lobe fossa. There is no evidence of acute
abnormalities.

## 2012-07-15 LAB — URINE CULTURE

## 2012-08-08 IMAGING — CR DG WRIST COMPLETE 3+V*R*
1 series · 4 of 4 positions shown · non-contrast
Comparison: none

REASON FOR EXAM: pain
COMMENTS:   May transport without cardiac monitor

PROCEDURE:     DXR - DXR WRIST RT COMP WITH OBLIQUES  - August 29, 2010 [DATE]
RESULT:     Images of the right hand demonstrate a tiny avulsion along the
posterior aspect of the carpus. The etiology of this is uncertain. This was
present on the previous exam on 06/02/2010 and is therefore not new.

[Series 1: view not recorded · 0.17mm/px · 4 of 4 slices shown]
[im 1/4]
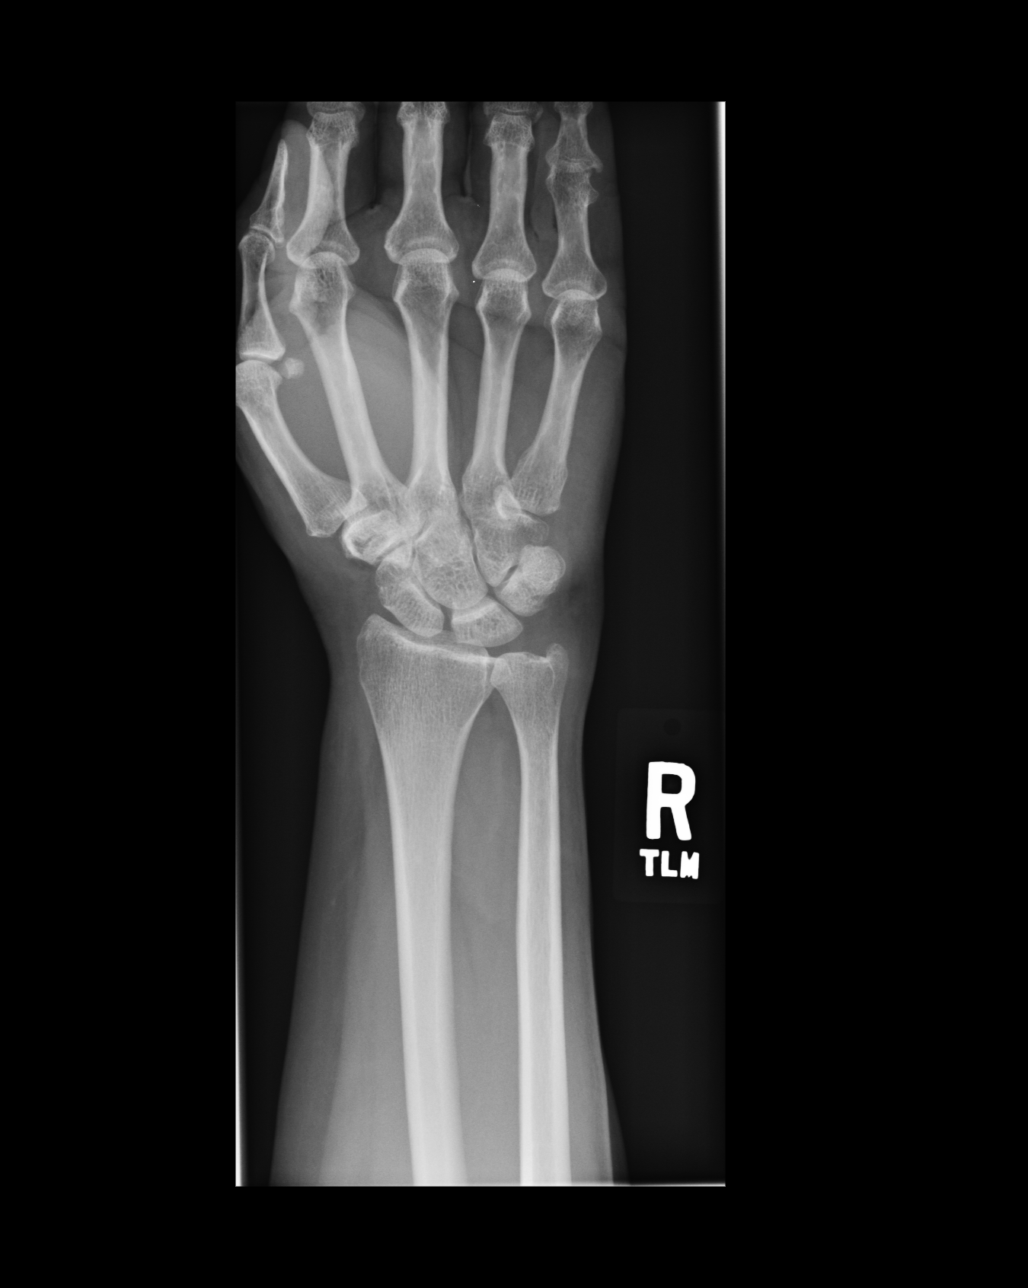
[im 2/4]
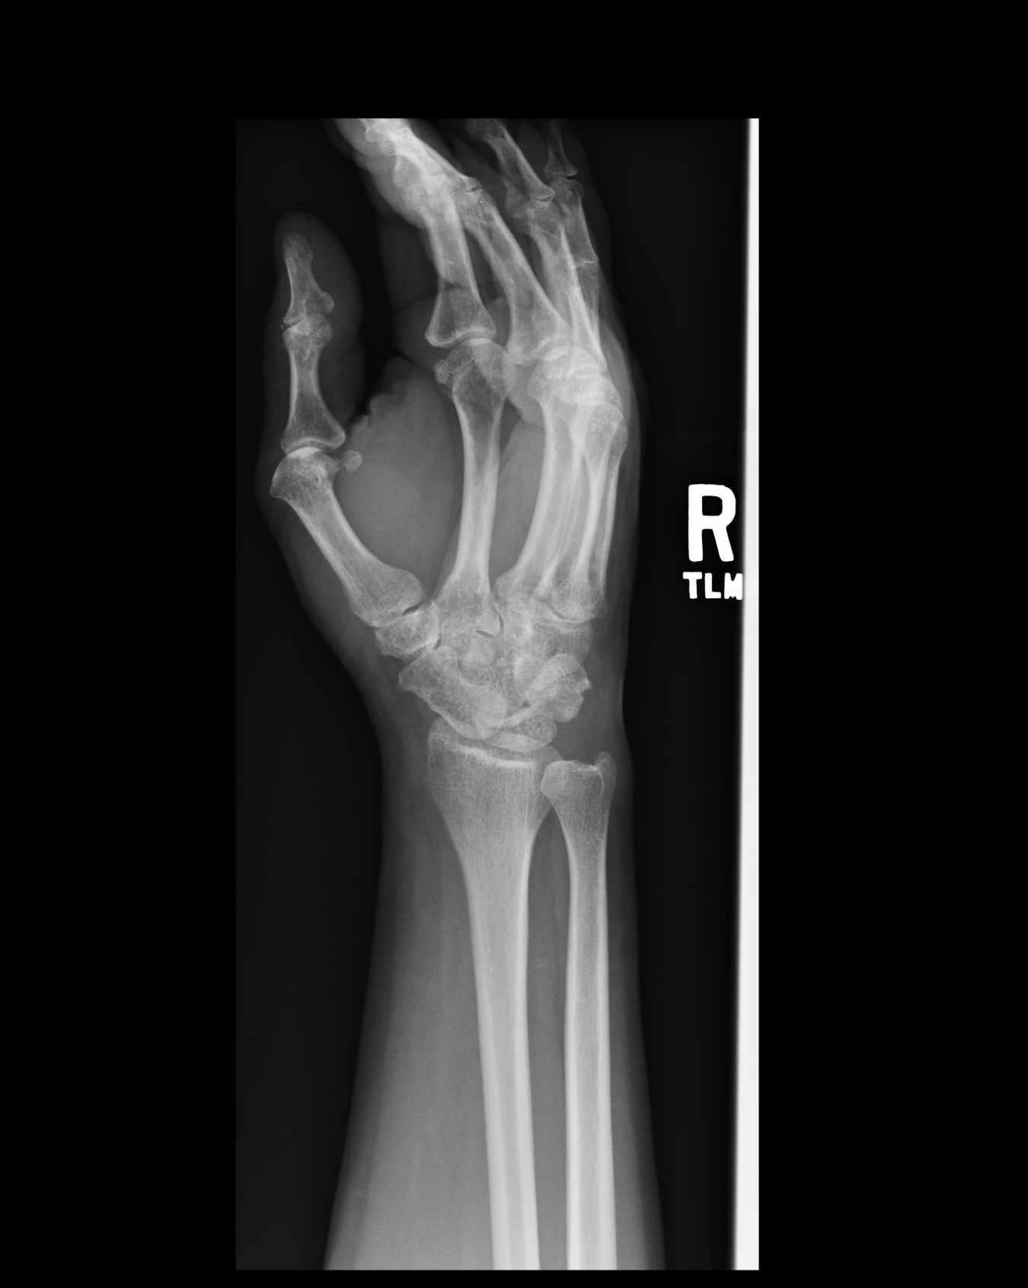
[im 3/4]
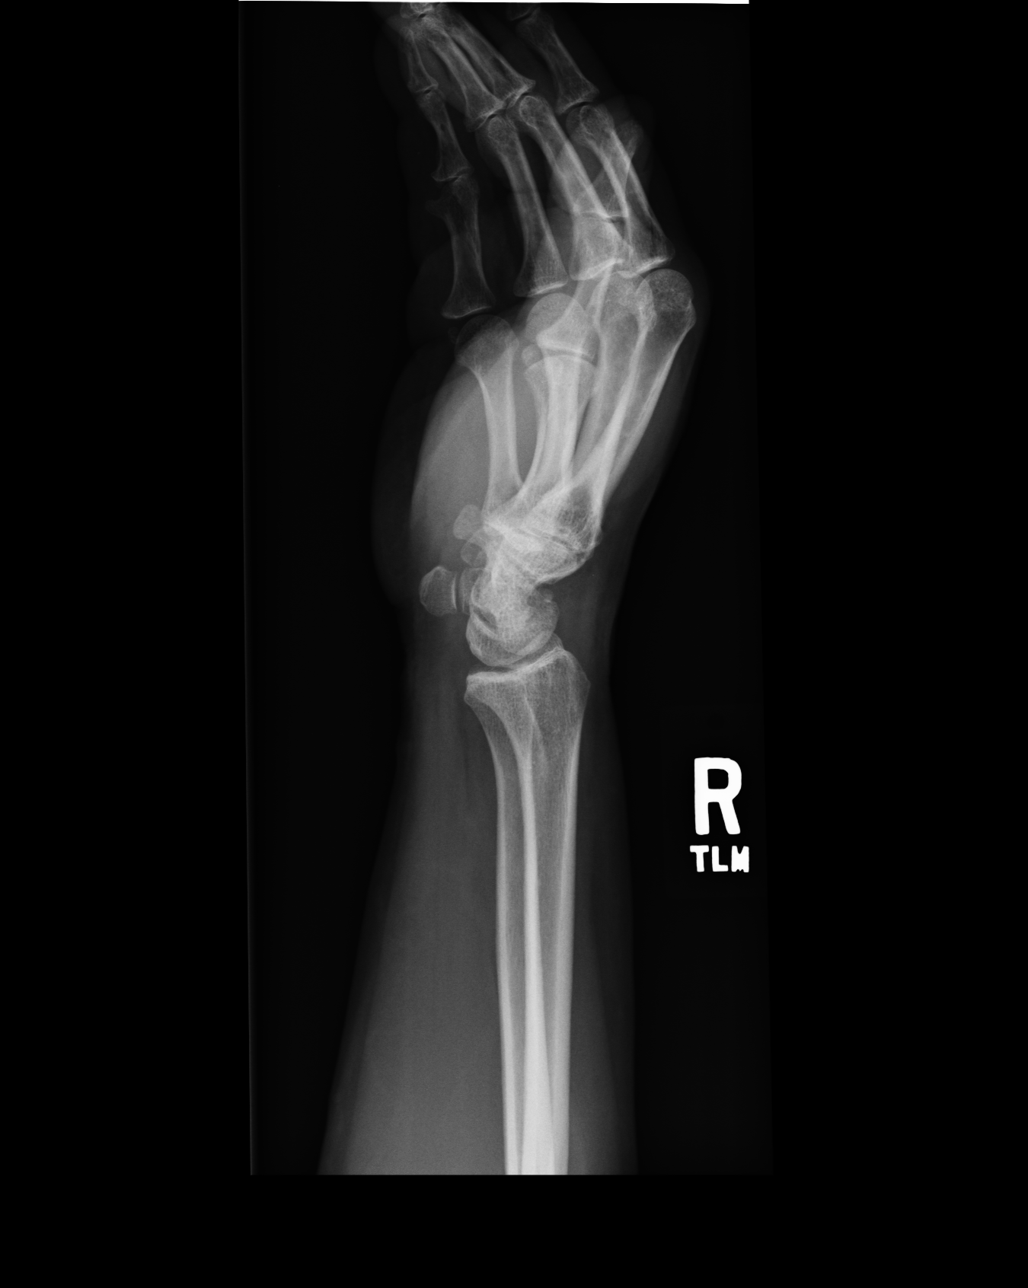
[im 4/4]
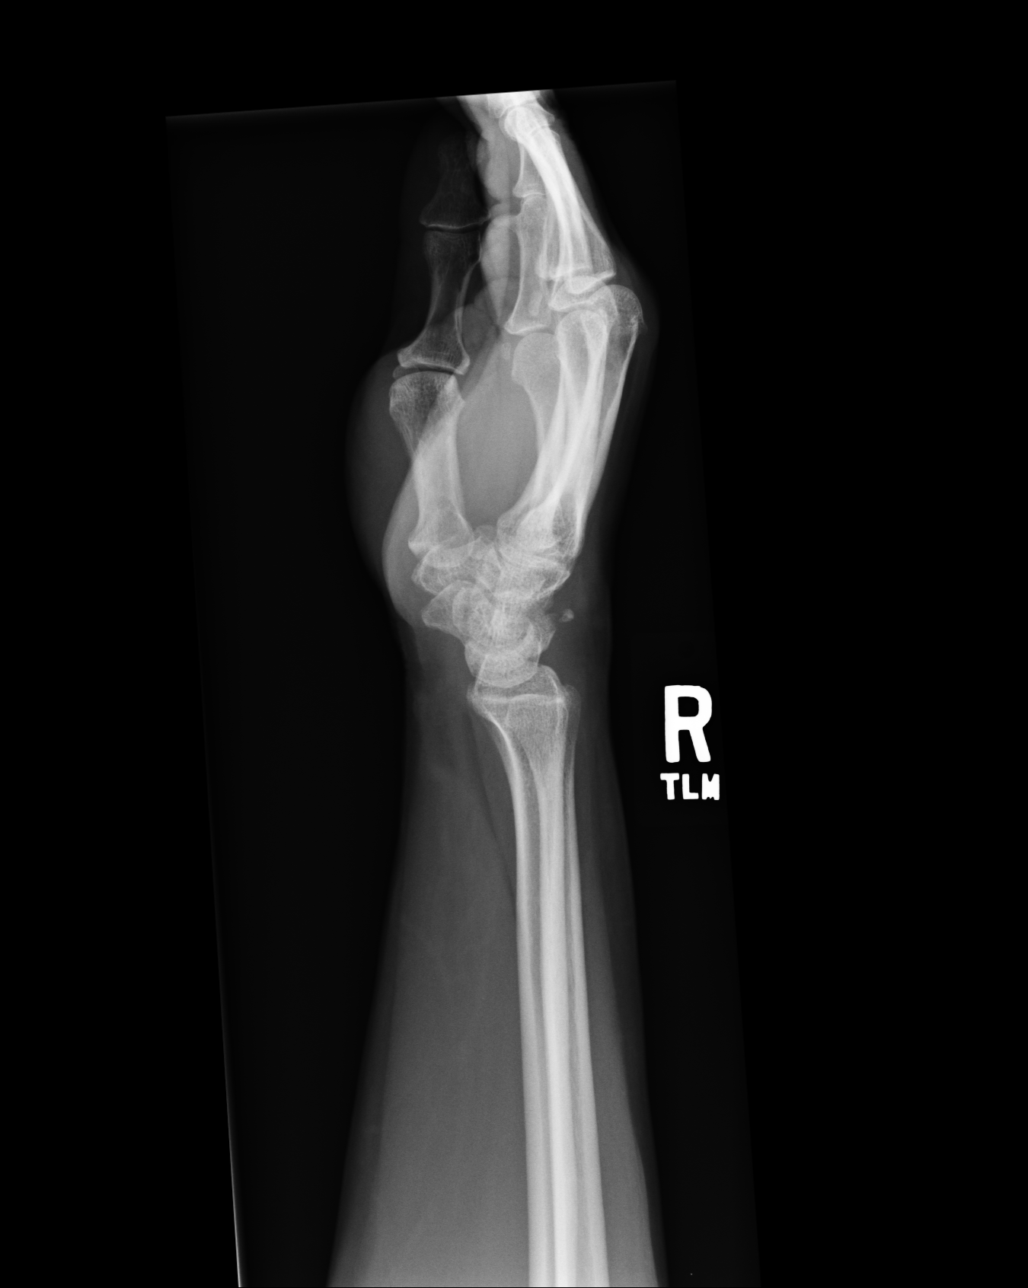

[4 of 4 positions shown; findings below may reference images not displayed]

IMPRESSION: No acute bony abnormality demonstrated. Please see above.

## 2012-08-08 IMAGING — CR DG CHEST 1V
1 series · 1 of 1 positions shown · non-contrast
Comparison: none

REASON FOR EXAM: syncope
COMMENTS:   May transport without cardiac monitor

[view not recorded]
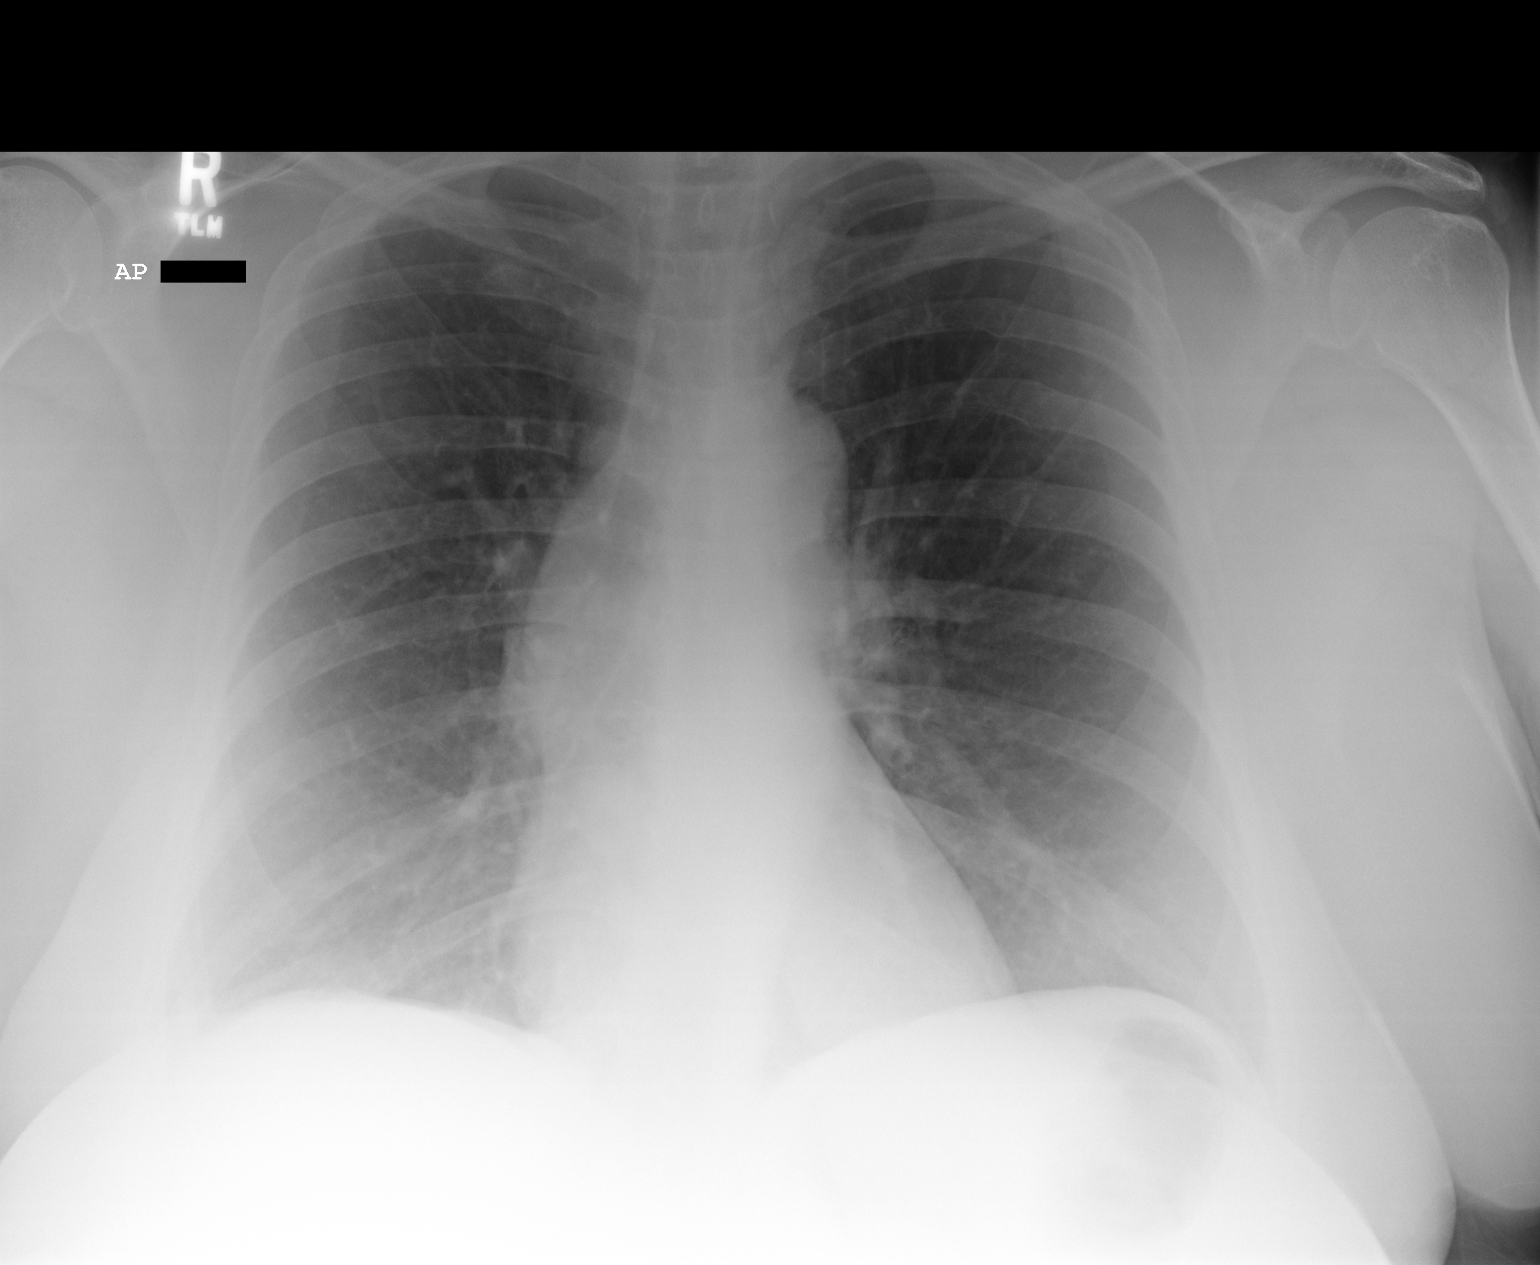

[1 of 1 positions shown; findings below may reference images not displayed]

PROCEDURE:     DXR - DXR CHEST 1 VIEWAP OR PA  - August 29, 2010 [DATE]

RESULT:     Comparison is made to the study dated 08/03/2010.

The lungs are clear. The heart and pulmonary vessels are normal. The bony
and mediastinal structures are unremarkable. There is no effusion. There is
no pneumothorax or evidence of congestive failure.
IMPRESSION: No acute cardiopulmonary disease.

## 2012-08-18 ENCOUNTER — Inpatient Hospital Stay: Payer: Self-pay | Admitting: Family Medicine

## 2012-08-18 LAB — CBC WITH DIFFERENTIAL/PLATELET
Basophil %: 0.5 %
Eosinophil #: 0.1 10*3/uL (ref 0.0–0.7)
Eosinophil %: 0.6 %
HGB: 12.8 g/dL (ref 12.0–16.0)
Lymphocyte #: 1.6 10*3/uL (ref 1.0–3.6)
Lymphocyte %: 19.4 %
MCH: 30.9 pg (ref 26.0–34.0)
MCHC: 33.5 g/dL (ref 32.0–36.0)
Monocyte %: 10.8 %
Neutrophil #: 5.7 10*3/uL (ref 1.4–6.5)
Neutrophil %: 68.7 %
Platelet: 179 10*3/uL (ref 150–440)
RDW: 16.3 % — ABNORMAL HIGH (ref 11.5–14.5)

## 2012-08-18 LAB — COMPREHENSIVE METABOLIC PANEL
Albumin: 3.9 g/dL (ref 3.4–5.0)
Alkaline Phosphatase: 259 U/L — ABNORMAL HIGH (ref 50–136)
Anion Gap: 13 (ref 7–16)
BUN: 6 mg/dL — ABNORMAL LOW (ref 7–18)
Bilirubin,Total: 1.4 mg/dL — ABNORMAL HIGH (ref 0.2–1.0)
Calcium, Total: 9.3 mg/dL (ref 8.5–10.1)
Chloride: 105 mmol/L (ref 98–107)
Co2: 19 mmol/L — ABNORMAL LOW (ref 21–32)
Creatinine: 0.83 mg/dL (ref 0.60–1.30)
EGFR (African American): >60
EGFR (Non-African Amer.): >60
Glucose: 126 mg/dL — ABNORMAL HIGH (ref 65–99)
Osmolality: 273 (ref 275–301)
Potassium: 3.7 mmol/L (ref 3.5–5.1)
SGOT(AST): 128 U/L — ABNORMAL HIGH (ref 15–37)
SGPT (ALT): 65 U/L (ref 12–78)
Sodium: 137 mmol/L (ref 136–145)
Total Protein: 10 g/dL — ABNORMAL HIGH (ref 6.4–8.2)

## 2012-08-18 LAB — PHENYTOIN LEVEL, TOTAL: Dilantin: 5.4 ug/mL — ABNORMAL LOW (ref 10.0–20.0)

## 2012-08-18 LAB — LIPASE, BLOOD: Lipase: 1663 U/L — ABNORMAL HIGH (ref 73–393)

## 2012-08-20 LAB — URINALYSIS, COMPLETE
Bacteria: NONE SEEN
Bilirubin,UR: NEGATIVE
Blood: NEGATIVE
Glucose,UR: NEGATIVE mg/dL (ref 0–75)
Ketone: NEGATIVE
Leukocyte Esterase: NEGATIVE
Nitrite: NEGATIVE
Ph: 7 (ref 4.5–8.0)
Protein: NEGATIVE
RBC,UR: <1 /HPF (ref 0–5)
Specific Gravity: 1.005 (ref 1.003–1.030)
Squamous Epithelial: <1
WBC UR: <1 /HPF (ref 0–5)

## 2012-08-20 LAB — COMPREHENSIVE METABOLIC PANEL
Albumin: 3.1 g/dL — ABNORMAL LOW (ref 3.4–5.0)
Alkaline Phosphatase: 172 U/L — ABNORMAL HIGH (ref 50–136)
Anion Gap: 6 — ABNORMAL LOW (ref 7–16)
Bilirubin,Total: 0.7 mg/dL (ref 0.2–1.0)
Co2: 25 mmol/L (ref 21–32)
Creatinine: 0.8 mg/dL (ref 0.60–1.30)
EGFR (African American): >60
EGFR (Non-African Amer.): >60
Glucose: 81 mg/dL (ref 65–99)
Osmolality: 280 (ref 275–301)
Potassium: 3.4 mmol/L — ABNORMAL LOW (ref 3.5–5.1)
SGOT(AST): 65 U/L — ABNORMAL HIGH (ref 15–37)
SGPT (ALT): 47 U/L (ref 12–78)
Sodium: 142 mmol/L (ref 136–145)
Total Protein: 7.9 g/dL (ref 6.4–8.2)

## 2012-08-20 LAB — LIPASE, BLOOD: Lipase: 572 U/L — ABNORMAL HIGH (ref 73–393)

## 2012-08-21 LAB — POTASSIUM: Potassium: 3.3 mmol/L — ABNORMAL LOW (ref 3.5–5.1)

## 2012-08-21 LAB — VANCOMYCIN, TROUGH: Vancomycin, Trough: 13 ug/mL (ref 10–20)

## 2012-08-21 LAB — MAGNESIUM: Magnesium: 1.7 mg/dL — ABNORMAL LOW

## 2012-08-21 LAB — LIPASE, BLOOD: Lipase: 833 U/L — ABNORMAL HIGH (ref 73–393)

## 2012-08-22 LAB — PLATELET COUNT: Platelet: 120 10*3/uL — ABNORMAL LOW (ref 150–440)

## 2012-08-22 LAB — BASIC METABOLIC PANEL
Anion Gap: 6 — ABNORMAL LOW (ref 7–16)
BUN: 8 mg/dL (ref 7–18)
Chloride: 108 mmol/L — ABNORMAL HIGH (ref 98–107)
Creatinine: 0.77 mg/dL (ref 0.60–1.30)
EGFR (African American): >60
EGFR (Non-African Amer.): >60
Glucose: 84 mg/dL (ref 65–99)
Sodium: 141 mmol/L (ref 136–145)

## 2012-08-23 LAB — CBC WITH DIFFERENTIAL/PLATELET
Basophil #: 0 10*3/uL (ref 0.0–0.1)
Basophil %: 0.2 %
Eosinophil #: 0.1 10*3/uL (ref 0.0–0.7)
Eosinophil %: 1.4 %
HGB: 10.7 g/dL — ABNORMAL LOW (ref 12.0–16.0)
Lymphocyte #: 1.7 10*3/uL (ref 1.0–3.6)
Lymphocyte %: 23 %
MCH: 30.4 pg (ref 26.0–34.0)
MCHC: 32.2 g/dL (ref 32.0–36.0)
MCV: 94 fL (ref 80–100)
Monocyte %: 12.5 %
Platelet: 123 10*3/uL — ABNORMAL LOW (ref 150–440)
RBC: 3.53 10*6/uL — ABNORMAL LOW (ref 3.80–5.20)
RDW: 16 % — ABNORMAL HIGH (ref 11.5–14.5)
WBC: 7.4 10*3/uL (ref 3.6–11.0)

## 2012-08-23 LAB — VANCOMYCIN, TROUGH: Vancomycin, Trough: 16 ug/mL (ref 10–20)

## 2012-08-23 LAB — CULTURE, BLOOD (SINGLE)

## 2012-08-23 LAB — BETA STREP CULTURE(ARMC)

## 2012-08-27 LAB — CULTURE, BLOOD (SINGLE)

## 2012-09-21 LAB — CBC
HCT: 33 % — ABNORMAL LOW (ref 35.0–47.0)
HGB: 10.9 g/dL — ABNORMAL LOW (ref 12.0–16.0)
MCV: 93 fL (ref 80–100)
Platelet: 169 10*3/uL (ref 150–440)
RBC: 3.55 10*6/uL — ABNORMAL LOW (ref 3.80–5.20)
RDW: 15.9 % — ABNORMAL HIGH (ref 11.5–14.5)
WBC: 9.1 10*3/uL (ref 3.6–11.0)

## 2012-09-21 LAB — LIPASE, BLOOD: Lipase: 369 U/L (ref 73–393)

## 2012-09-21 LAB — BASIC METABOLIC PANEL
Anion Gap: 8 (ref 7–16)
BUN: 7 mg/dL (ref 7–18)
Calcium, Total: 8.5 mg/dL (ref 8.5–10.1)
Co2: 24 mmol/L (ref 21–32)
Creatinine: 0.88 mg/dL (ref 0.60–1.30)
Glucose: 135 mg/dL — ABNORMAL HIGH (ref 65–99)
Potassium: 3.2 mmol/L — ABNORMAL LOW (ref 3.5–5.1)
Sodium: 142 mmol/L (ref 136–145)

## 2012-09-21 LAB — CK TOTAL AND CKMB (NOT AT ARMC): CK, Total: 161 U/L (ref 21–215)

## 2012-09-22 ENCOUNTER — Observation Stay: Payer: Self-pay | Admitting: Specialist

## 2012-09-22 LAB — TROPONIN I: Troponin-I: <0.02 ng/mL

## 2012-09-22 LAB — CK-MB: CK-MB: 1 ng/mL (ref 0.5–3.6)

## 2012-09-22 LAB — ETHANOL: Ethanol %: <0.003 % (ref 0.000–0.080)

## 2012-09-22 LAB — AMYLASE: Amylase: 48 U/L (ref 25–115)

## 2012-09-22 LAB — LIPASE, BLOOD: Lipase: 209 U/L (ref 73–393)

## 2012-10-05 IMAGING — CR DG ABDOMEN 3V
1 series · 5 of 5 positions shown · non-contrast
Comparison: none

REASON FOR EXAM: Pain
COMMENTS:   May transport without cardiac monitor

[Series 1: view not recorded · 0.17mm/px · 5 of 5 slices shown]
[im 1/5]
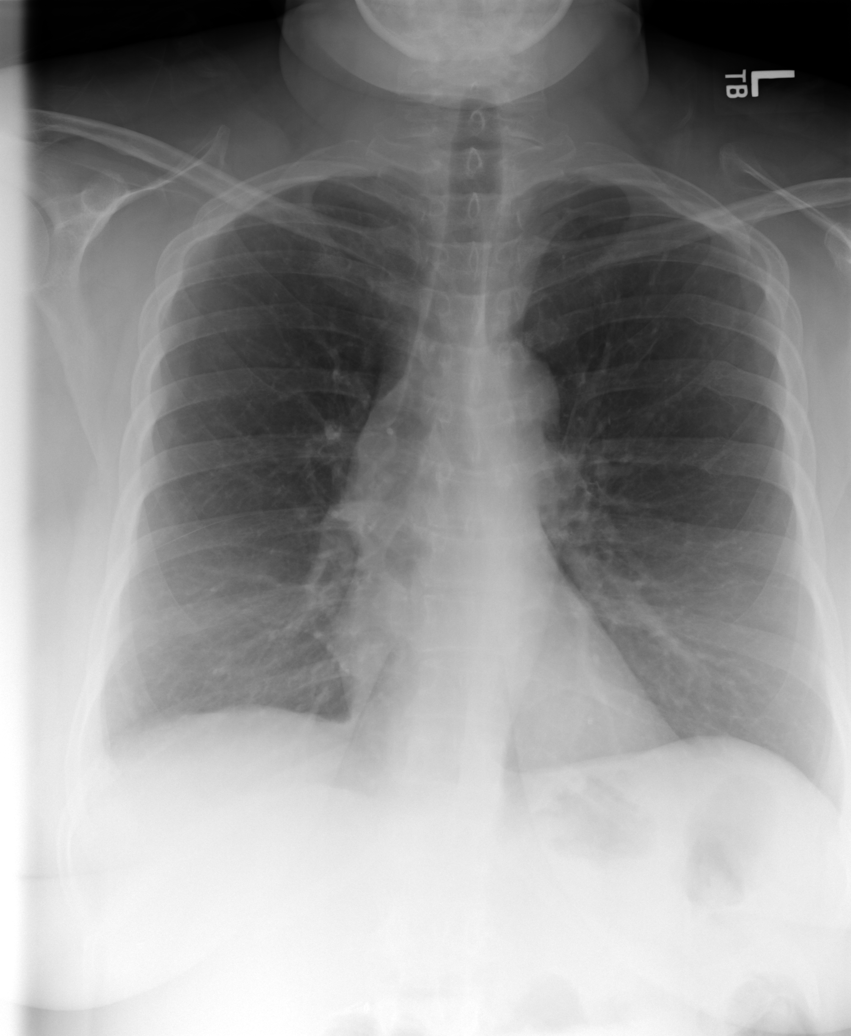
[im 2/5]
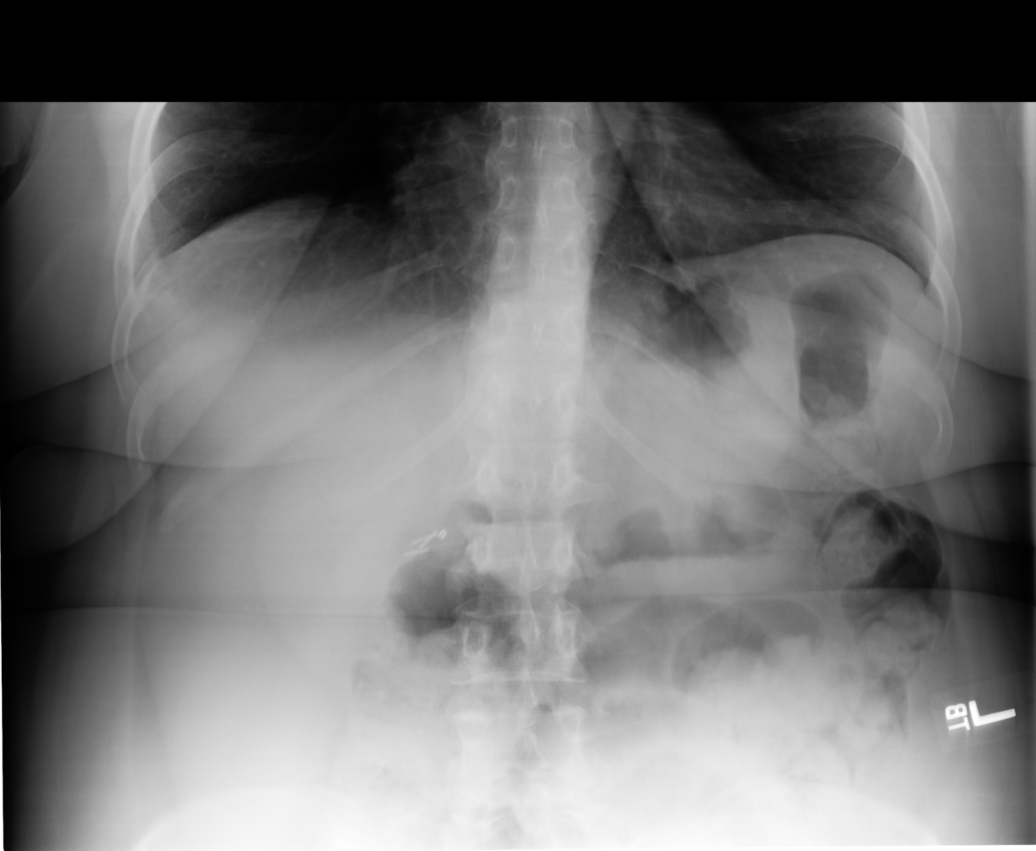
[im 3/5]
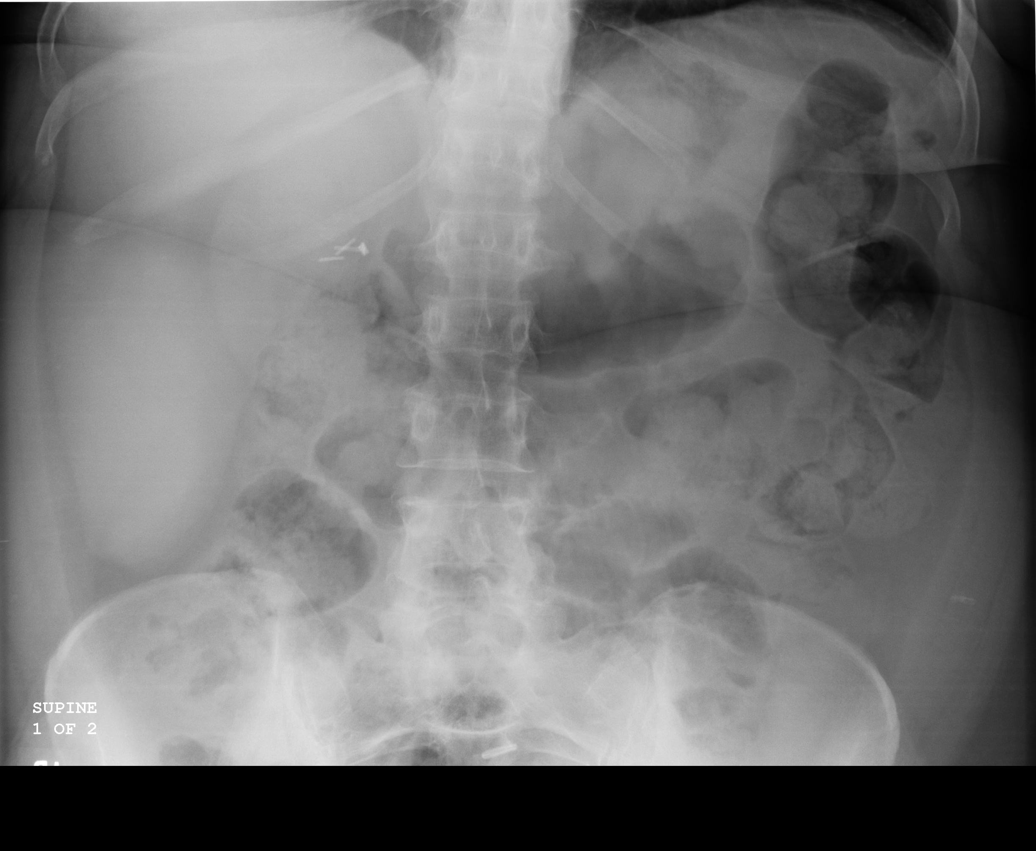
[im 4/5]
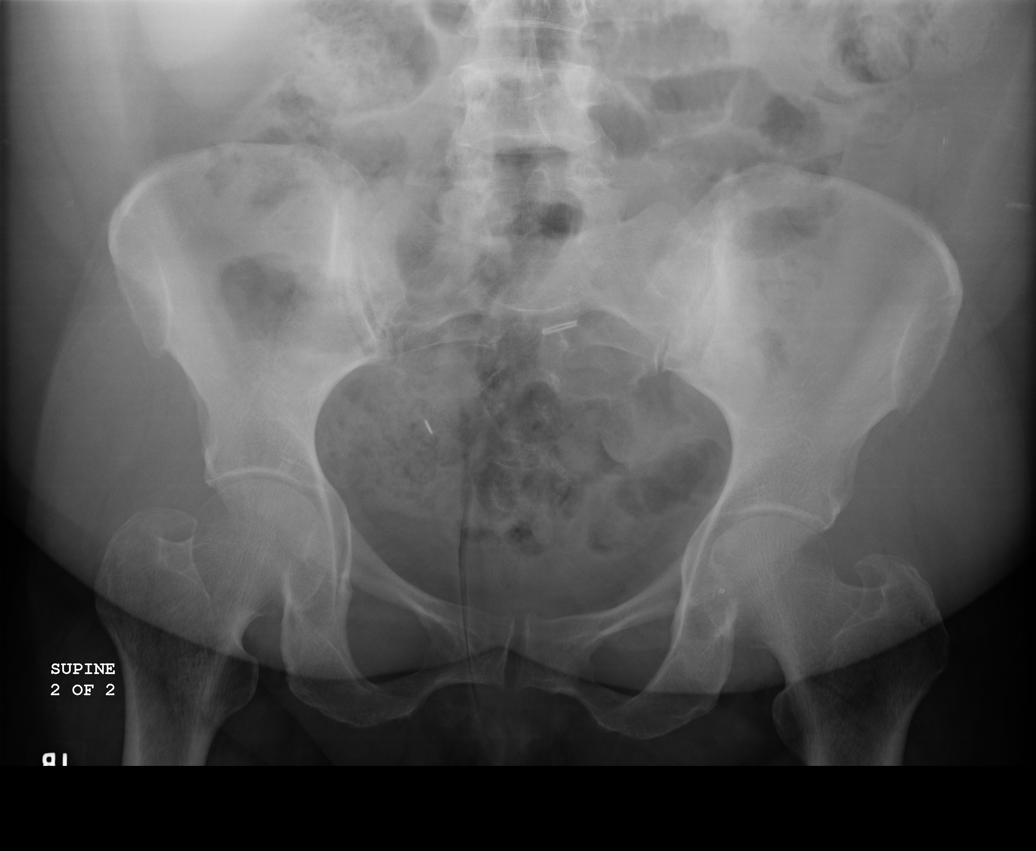
[im 5/5]
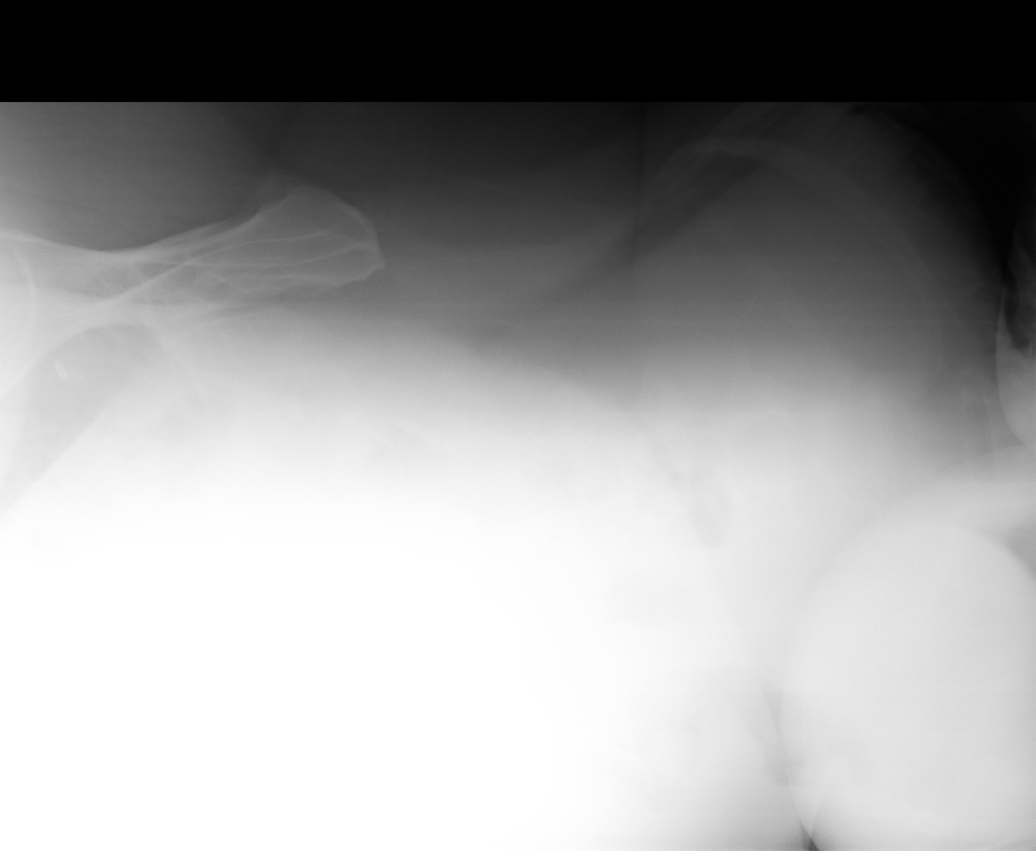

[5 of 5 positions shown; findings below may reference images not displayed]

PROCEDURE:     DXR - DXR ABDOMEN 3-WAY (INCL PA CXR)  - October 26, 2010 [DATE]

RESULT:     PA view of the chest shows the lung fields to be clear.

Multiple views of the abdomen were obtained. No subdiaphragmatic free air is
seen. The bowel gas pattern shows no specific abnormalities. There is a
moderately large amount of fecal material in the colon. No dilated bowel
loops suspicious for bowel obstruction are seen. Post operative metallic
clips are noted in the region of the gallbladder bed and in the pelvic area
bilaterally. No acute bony abnormalities are seen.
IMPRESSION: 1.  No bowel obstruction or other acute changes identified.
2.  There is a moderately large amount of fecal material in the colon.

## 2012-10-07 ENCOUNTER — Emergency Department: Payer: Self-pay | Admitting: Emergency Medicine

## 2012-10-07 LAB — CBC
HCT: 32.3 % — ABNORMAL LOW (ref 35.0–47.0)
MCHC: 31.9 g/dL — ABNORMAL LOW (ref 32.0–36.0)
MCV: 91 fL (ref 80–100)
Platelet: 187 10*3/uL (ref 150–440)
RBC: 3.53 10*6/uL — ABNORMAL LOW (ref 3.80–5.20)
RDW: 15.6 % — ABNORMAL HIGH (ref 11.5–14.5)
WBC: 7.2 10*3/uL (ref 3.6–11.0)

## 2012-10-07 LAB — BASIC METABOLIC PANEL
Anion Gap: 6 — ABNORMAL LOW (ref 7–16)
BUN: 7 mg/dL (ref 7–18)
Calcium, Total: 8.3 mg/dL — ABNORMAL LOW (ref 8.5–10.1)
Chloride: 110 mmol/L — ABNORMAL HIGH (ref 98–107)
Glucose: 86 mg/dL (ref 65–99)
Osmolality: 280 (ref 275–301)
Potassium: 3.6 mmol/L (ref 3.5–5.1)
Sodium: 142 mmol/L (ref 136–145)

## 2012-10-15 ENCOUNTER — Telehealth: Payer: Self-pay | Admitting: Internal Medicine

## 2012-10-15 NOTE — Telephone Encounter (Signed)
Patient states has appt with Dr. Dan Humphreys 11/03/12 to establish care as a new patient.  States needs appt sooner than that for swelling in the feet and ankles.  Caller transferred to front office staff for assistance; when transferred, patient had disconnected from call.  krs/can

## 2012-10-16 NOTE — Telephone Encounter (Signed)
Fwd to Dr. Walker 

## 2012-10-16 NOTE — Telephone Encounter (Signed)
We do not have openings earlier as far as I can see. We can put her in a slot if one becomes available. Or, she can establish with Raquel if she has openings.

## 2012-10-18 ENCOUNTER — Emergency Department: Payer: Self-pay | Admitting: Emergency Medicine

## 2012-10-18 LAB — CBC
HCT: 31.8 % — ABNORMAL LOW (ref 35.0–47.0)
HGB: 10.2 g/dL — ABNORMAL LOW (ref 12.0–16.0)
MCHC: 32.2 g/dL (ref 32.0–36.0)
Platelet: 177 10*3/uL (ref 150–440)
RBC: 3.52 10*6/uL — ABNORMAL LOW (ref 3.80–5.20)
RDW: 15.5 % — ABNORMAL HIGH (ref 11.5–14.5)
WBC: 6.3 10*3/uL (ref 3.6–11.0)

## 2012-10-18 LAB — COMPREHENSIVE METABOLIC PANEL
Albumin: 2.9 g/dL — ABNORMAL LOW (ref 3.4–5.0)
Alkaline Phosphatase: 148 U/L — ABNORMAL HIGH (ref 50–136)
BUN: 7 mg/dL (ref 7–18)
Calcium, Total: 8.6 mg/dL (ref 8.5–10.1)
Chloride: 112 mmol/L — ABNORMAL HIGH (ref 98–107)
Co2: 26 mmol/L (ref 21–32)
EGFR (African American): >60
EGFR (Non-African Amer.): >60
Glucose: 100 mg/dL — ABNORMAL HIGH (ref 65–99)
SGPT (ALT): 36 U/L (ref 12–78)
Sodium: 144 mmol/L (ref 136–145)

## 2012-10-18 LAB — APTT: Activated PTT: 33.2 secs (ref 23.6–35.9)

## 2012-10-18 LAB — LIPASE, BLOOD: Lipase: 127 U/L (ref 73–393)

## 2012-10-18 LAB — PROTIME-INR
INR: 1.3
Prothrombin Time: 16 secs — ABNORMAL HIGH (ref 11.5–14.7)

## 2012-10-20 ENCOUNTER — Telehealth: Payer: Self-pay | Admitting: Internal Medicine

## 2012-10-20 NOTE — Telephone Encounter (Signed)
Patient Information:  Caller Name: Shevette  Phone: [phone]  Patient: [name]  Gender: Female  DOB: 02/01/1956  Age: 57 Years  PCP: Ronna Polio (Adults only)  Pregnant: No  Office Follow Up:  Does the office need to follow up with this patient?: No  Instructions For The Office: N/A  RN Note:  Per disposition appt shceduled for 10/21/12 1100.  Symptoms  Reason For Call & Symptoms: Pt states she has UTI (diagnosed in ED on  10/18/12) feet swelling now.  Reviewed Health History In EMR: Yes  Reviewed Medications In EMR: Yes  Reviewed Allergies In EMR: Yes  Reviewed Surgeries / Procedures: Yes  Date of Onset of Symptoms: 10/18/2012 OB / GYN:  LMP: Unknown  Guideline(s) Used:  Urination Pain - Female  Disposition Per Guideline:   See Today in Office  Reason For Disposition Reached:   Taking antibiotic > 3 days for UTI and painful urination not improved  Advice Given:  Fluids:   Drink extra fluids. Drink 8-10 glasses of liquids a day (Reason: to produce a dilute, non-irritating urine).  Cranberry Juice:   Some people think that drinking cranberry juice may help in fighting urinary tract infections. However, there is no good research that has ever proved this.  Call Back If:  You become worse.  Patient Will Follow Care Advice:  YES  Appointment Scheduled:  10/21/2012 11:00:00 Appointment Scheduled Provider:  Orville Govern

## 2012-10-20 NOTE — Telephone Encounter (Signed)
Left message to call back  

## 2012-10-21 ENCOUNTER — Encounter: Payer: Self-pay | Admitting: Adult Health

## 2012-10-21 ENCOUNTER — Ambulatory Visit (INDEPENDENT_AMBULATORY_CARE_PROVIDER_SITE_OTHER): Payer: Medicare Other | Admitting: Adult Health

## 2012-10-21 VITALS — BP 110/70 | HR 88 | Temp 98.1°F | Resp 14 | Wt 223.0 lb

## 2012-10-21 DIAGNOSIS — R300 Dysuria: Secondary | ICD-10-CM

## 2012-10-21 LAB — POCT URINALYSIS DIPSTICK
Bilirubin, UA: NEGATIVE
Blood, UA: NEGATIVE
Glucose, UA: NEGATIVE
Ketones, UA: NEGATIVE
Spec Grav, UA: 1.015
Urobilinogen, UA: 4

## 2012-10-21 MED ORDER — FLUCONAZOLE 150 MG PO TABS: 150.0000 mg | ORAL_TABLET | Freq: Once | ORAL | Status: DC

## 2012-10-21 MED ORDER — SPIRONOLACTONE 50 MG PO TABS: 50.0000 mg | ORAL_TABLET | Freq: Three times a day (TID) | ORAL | Status: DC

## 2012-10-21 MED ORDER — CEPHALEXIN 500 MG PO CAPS: 500.0000 mg | ORAL_CAPSULE | Freq: Two times a day (BID) | ORAL | Status: DC

## 2012-10-21 MED ORDER — ZOLPIDEM TARTRATE 10 MG PO TABS: 10.0000 mg | ORAL_TABLET | Freq: Every day | ORAL | Status: DC

## 2012-10-21 MED ORDER — METOPROLOL SUCCINATE ER 25 MG PO TB24: 25.0000 mg | ORAL_TABLET | Freq: Every day | ORAL | Status: DC

## 2012-10-21 MED ORDER — ESCITALOPRAM OXALATE 10 MG PO TABS: 10.0000 mg | ORAL_TABLET | Freq: Every day | ORAL | Status: DC

## 2012-10-21 MED ORDER — ALPRAZOLAM 1 MG PO TABS: 1.0000 mg | ORAL_TABLET | Freq: Every day | ORAL | Status: DC

## 2012-10-21 MED ORDER — AMLODIPINE BESYLATE 10 MG PO TABS: 10.0000 mg | ORAL_TABLET | Freq: Every day | ORAL | Status: DC

## 2012-10-21 MED ORDER — QUETIAPINE FUMARATE 100 MG PO TABS: 350.0000 mg | ORAL_TABLET | Freq: Every day | ORAL | Status: DC

## 2012-10-21 MED ORDER — OXYCODONE HCL 5 MG PO TABS: 5.0000 mg | ORAL_TABLET | Freq: Four times a day (QID) | ORAL | Status: DC

## 2012-10-21 NOTE — Telephone Encounter (Signed)
Patient returned the call and spoke with someone, because she was seen by Raquel today.

## 2012-10-21 NOTE — Assessment & Plan Note (Addendum)
Symptoms appear to be consistent with a urinary tract infection. Urine dipstick showing positive nitrites. Send urine for culture. Start Cipro twice a day x3 days. Also gave prescription for diflucan for yeast.

## 2012-10-21 NOTE — Patient Instructions (Addendum)
  I have ordered Cephalexin (Keflex) for your urinary tract infection.  I have also ordered Diflucan for your yeast infection. Take 1 tablet only. I have given you 2 extra tablets in the event you still have yeast after you finish your antibiotic. If you symptoms persist, you can take an additional tablet.  I have sent refills in for your medications   Please keep your appointment with Dr. Dan Humphreys at the end of this month.

## 2012-10-21 NOTE — Progress Notes (Signed)
  Subjective:    Patient ID: [name], female    DOB: 21-Jul-1955, 57 y.o.   MRN: [id number]  HPI  Patient is a 57 year old female with multiple medical problems including malignant brain tumor s/p sgy (2007) with recurrence, hypertension, arrhythmia, hepatitis C, chronic kidney disease, pancreatitis, history of migraines, seizures, depression, rectovaginal fistula s/p repair (2011) who presents to clinic with complaints of dysuria and foul odor of urine. She also has some itching of the external genitalia. Patient has an appointment on April 29th with Dr. Dan Humphreys to establish care; however, patient called and was unable to wait until then for her current symptoms of UTI. She denies fever or chills. She is having low back pain.  Patient reports that she has been using the emergency room at [facility] for her primary care needs. Will request medical records from [facility]. Patient was followed by [facility], neurology and surgery; however, patient has an outstanding bill and has been banned from the practice until the bill is paid.  Patient is requesting to refills on most of her medications.    Review of Systems  Respiratory: Positive for cough. Negative for chest tightness and wheezing.   Cardiovascular: Negative for chest pain.  Genitourinary: Positive for dysuria, frequency, flank pain and difficulty urinating. Negative for hematuria.       Foul odor of urine  Musculoskeletal: Positive for back pain.  Psychiatric/Behavioral: Negative for behavioral problems, confusion and agitation. The patient is not nervous/anxious.     BP 110/70  Pulse 88  Temp(Src) 98.1 F (36.7 C) (Oral)  Resp 14  Wt 223 lb (101.152 kg)  SpO2 97%    Objective:   Physical Exam  Constitutional: She is oriented to person, place, and time.  Overweight, pleasant female   Cardiovascular: Normal rate.   No murmur heard. Irregular rhythm  Pulmonary/Chest: She has no wheezes. She has no  rales.  Abdominal: Soft. Bowel sounds are normal. There is no tenderness.  Abdomen large, rounded.  Neurological: She is alert and oriented to person, place, and time.  Skin: Skin is warm and dry.  Psychiatric: She has a normal mood and affect. Her behavior is normal. Judgment and thought content normal.       Assessment & Plan:

## 2012-10-23 IMAGING — CR DG CHEST 2V
1 series · 2 of 2 positions shown · non-contrast
Comparison: none

REASON FOR EXAM: CVA
COMMENTS:   May transport without cardiac monitor

[Series 1: view not recorded · 0.17mm/px · 2 of 2 slices shown]
[im 1/2]
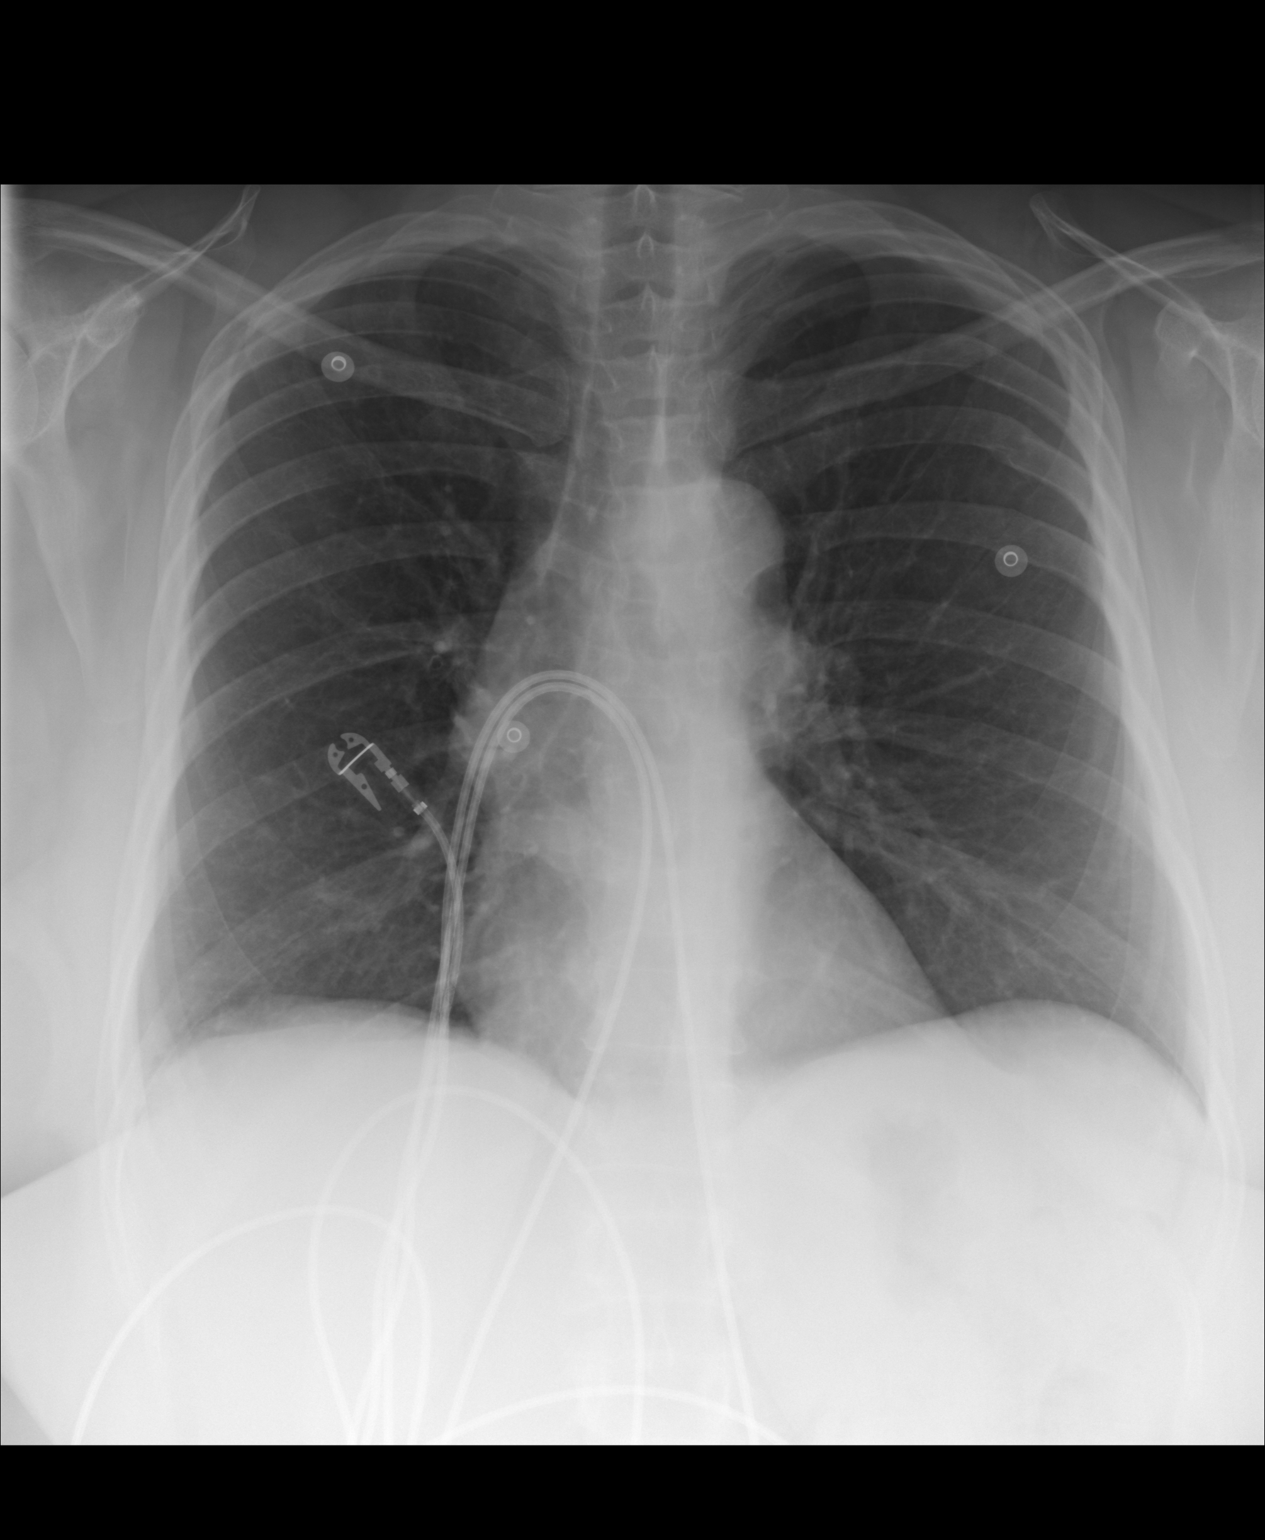
[im 2/2]
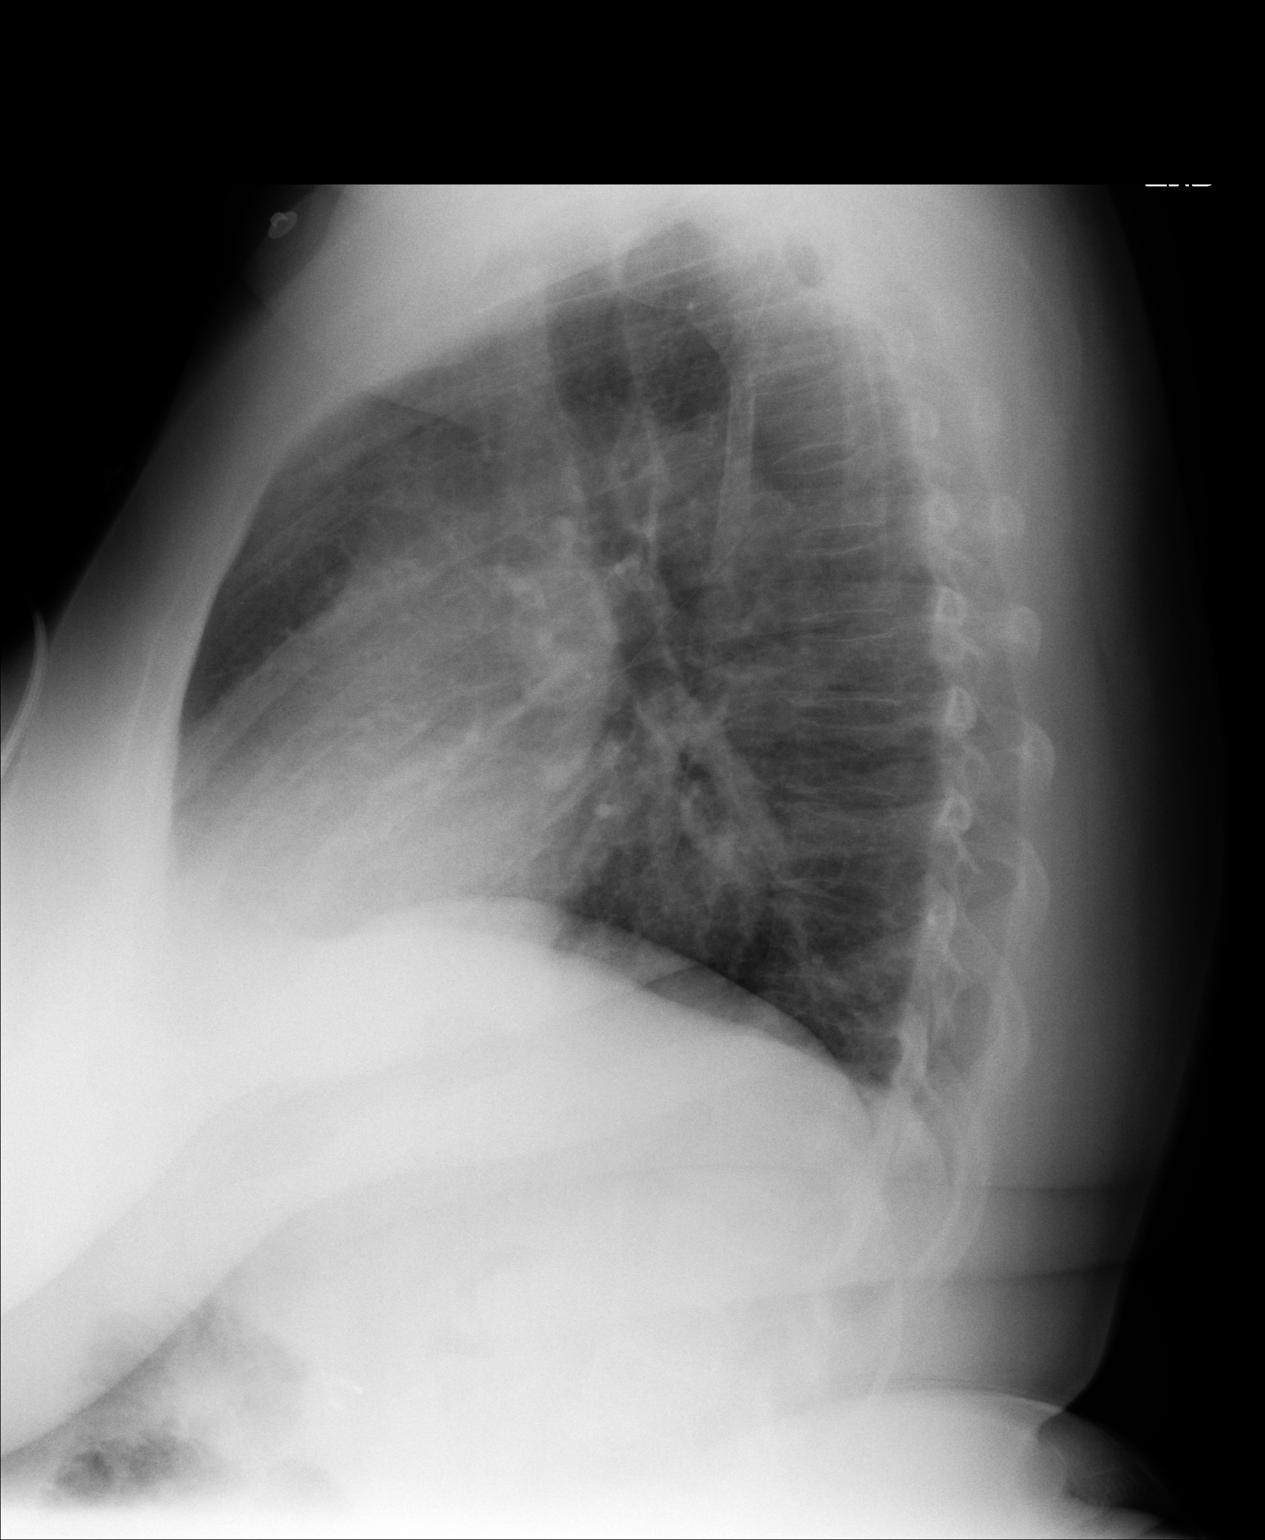

[2 of 2 positions shown; findings below may reference images not displayed]

PROCEDURE:     DXR - DXR CHEST PA (OR AP) AND LATERAL  - November 13, 2010  [DATE]

RESULT:     Comparison is made to the prior exam 08/29/2010. The lung fields
are clear. No pneumonia, pneumothorax or pleural effusion is seen. Old
healed rib fracture deformities are noted on the left. Heart size is normal.
IMPRESSION: No acute changes are identified.

## 2012-10-23 IMAGING — US ABDOMEN ULTRASOUND
1 series · 18 of 25 positions shown · non-contrast
Comparison: none

REASON FOR EXAM: left abd pain  a/p cholecystectomy
COMMENTS:

PROCEDURE:     US  - US ABDOMEN GENERAL SURVEY  - November 13, 2010  [DATE]
RESULT:     Liver normal. Patient's had a cholecystectomy. Common bile duct
measures 6.4 mm. No hydronephrosis. Visualized portions of the abdominal
aorta and pancreas are normal. Spleen normal.

[Series 1: abdomen ultrasound · 18 of 62 slices shown]
[im 1/62]
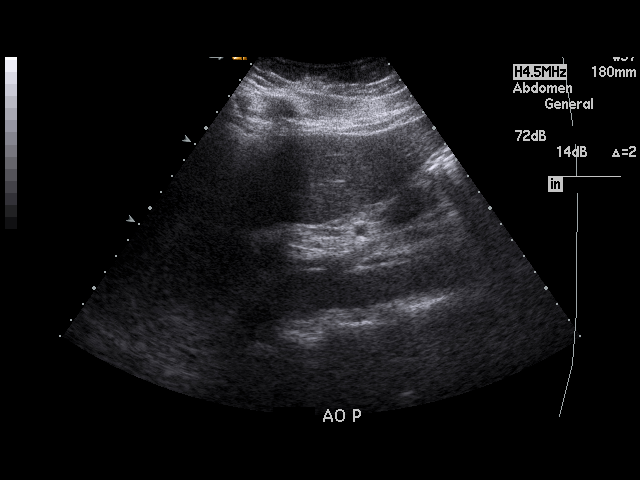
[im 6/62]
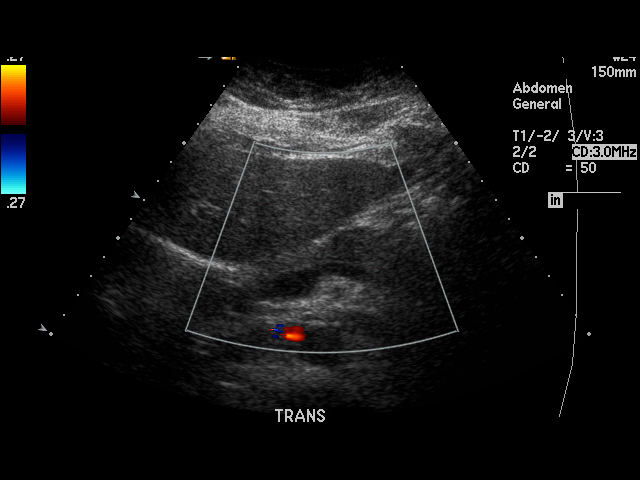
[im 8/62]
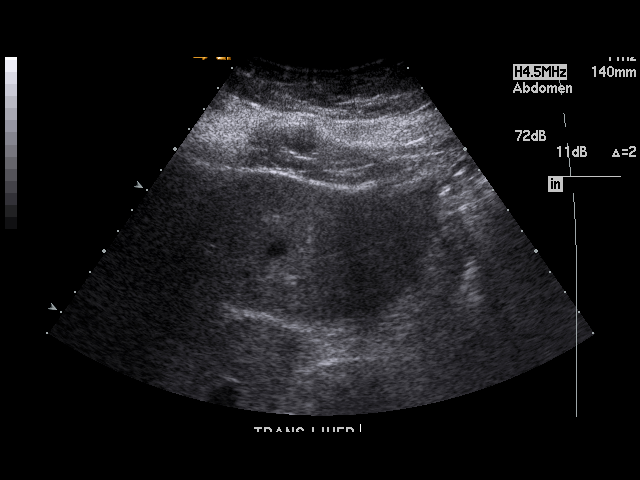
[im 11/62]
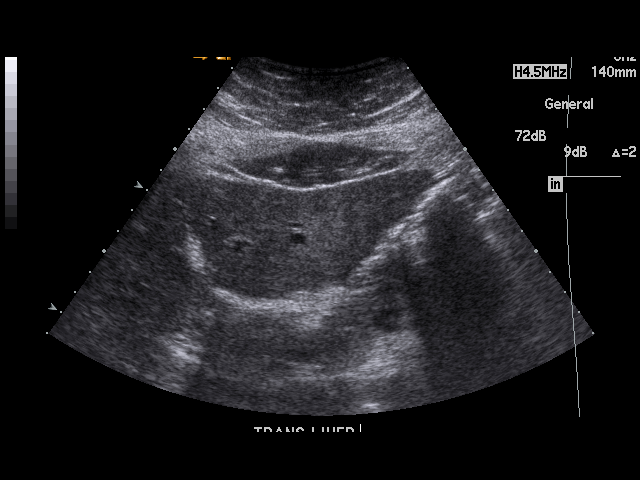
[im 16/62]
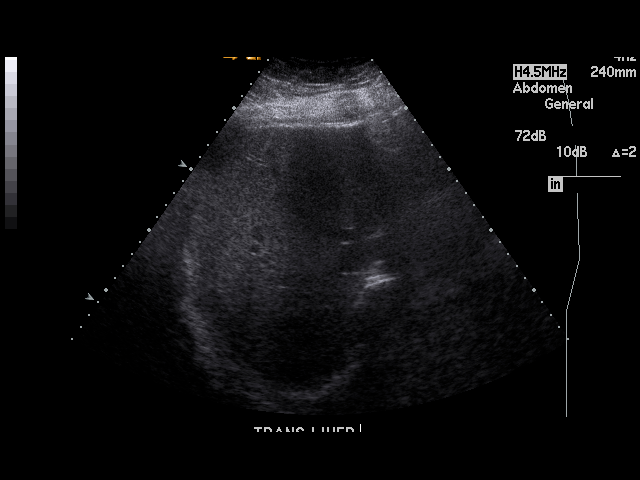
[im 18/62]
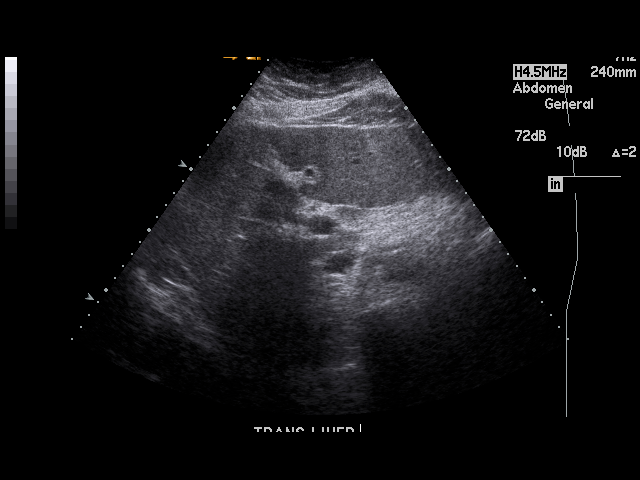
[im 23/62]
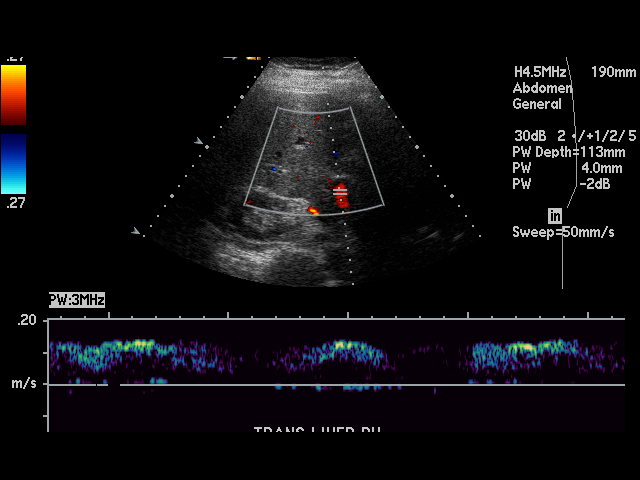
[im 26/62]
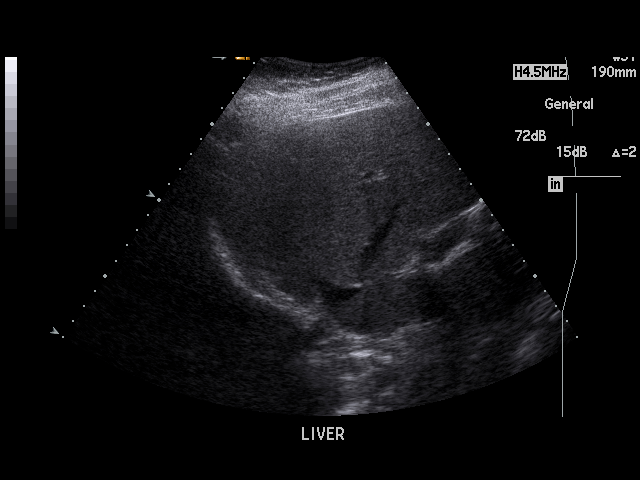
[im 28/62]
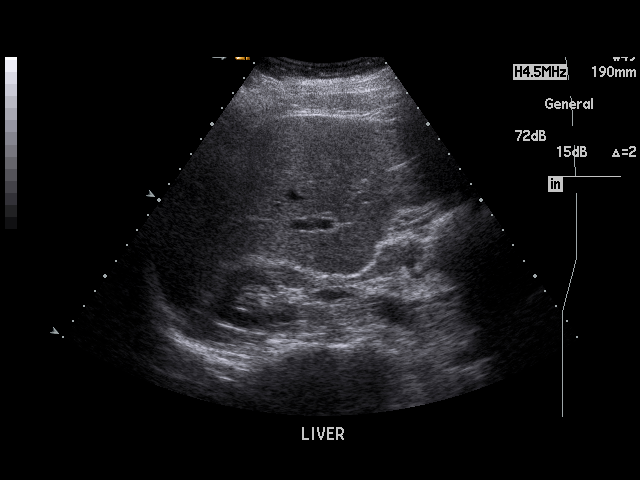
[im 34/62]
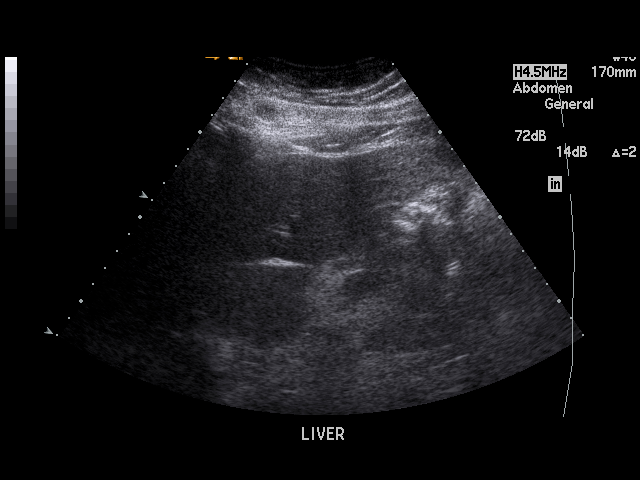
[im 36/62]
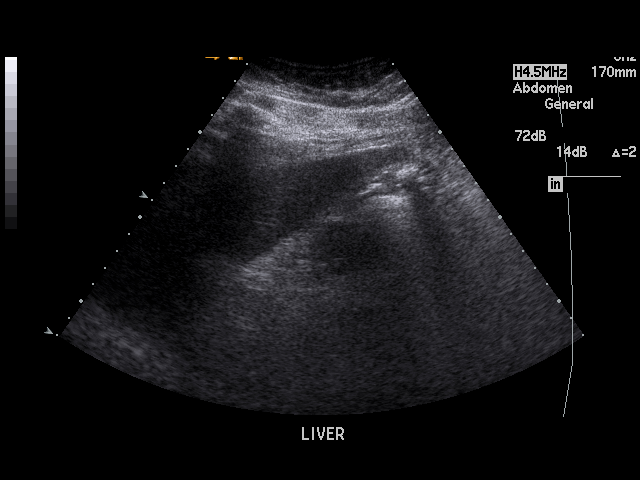
[im 39/62]
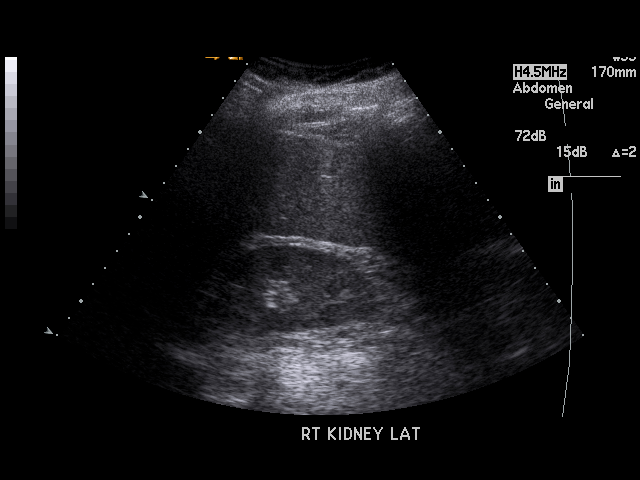
[im 44/62]
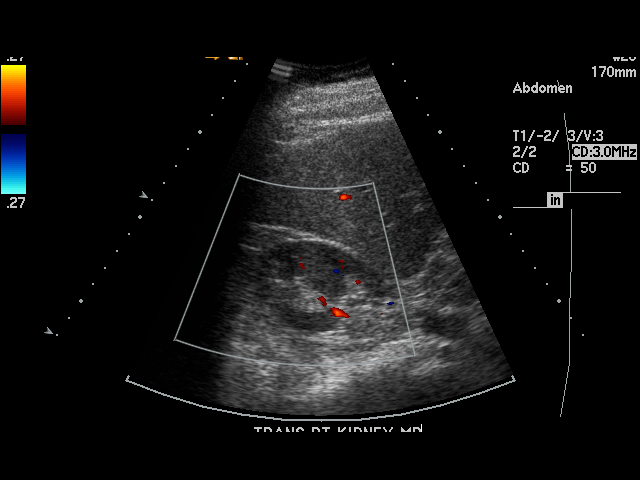
[im 46/62]
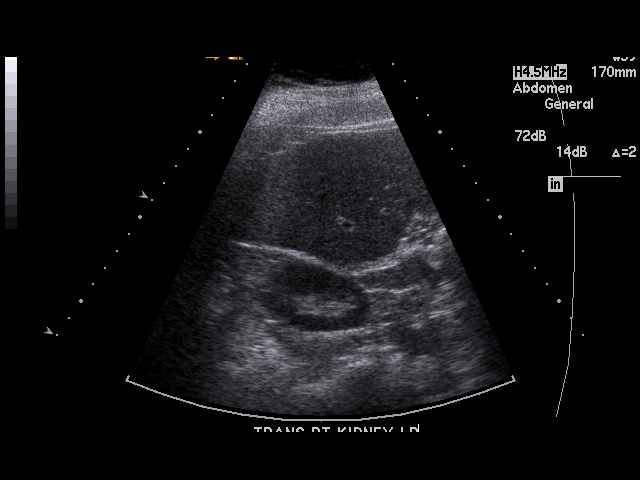
[im 51/62]
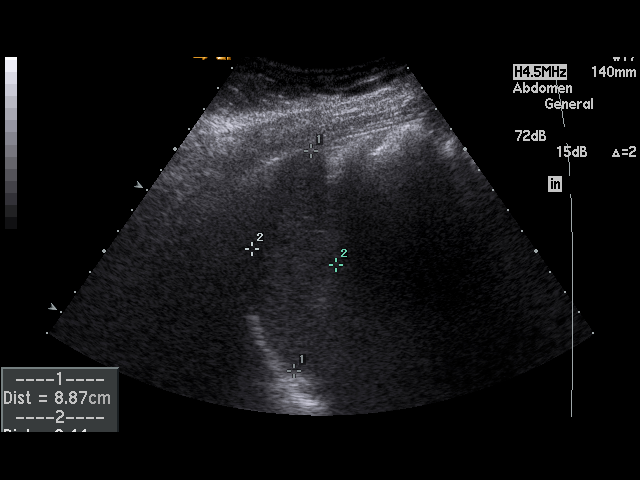
[im 54/62]
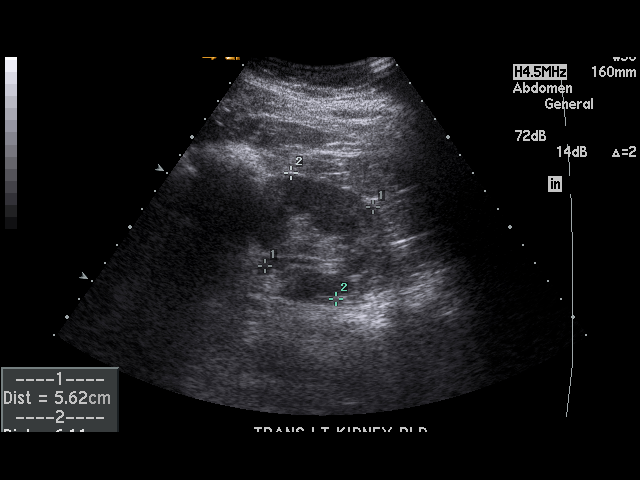
[im 56/62]
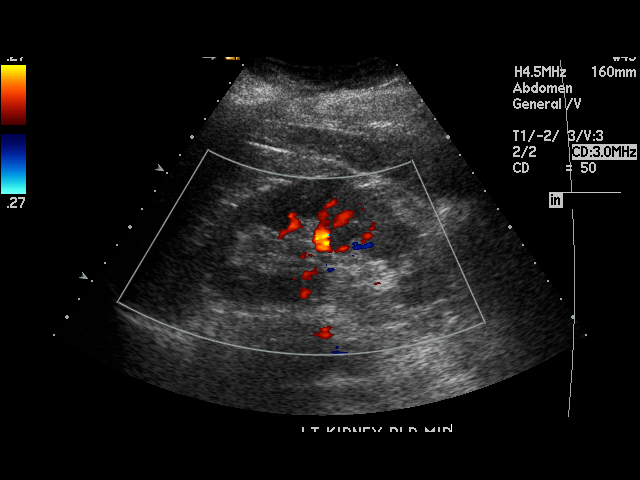
[im 62/62]
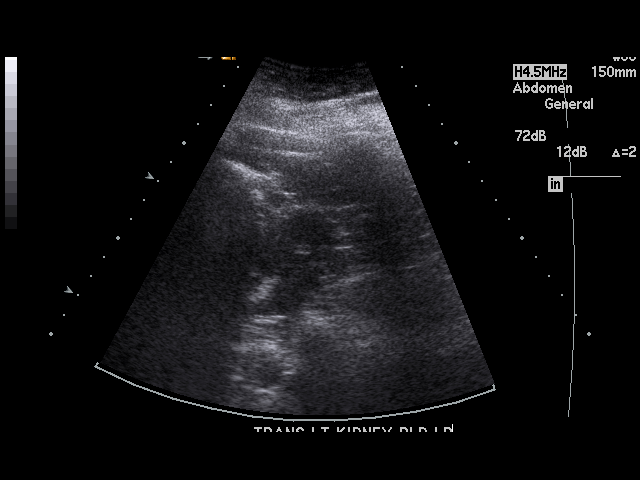

[18 of 25 positions shown; findings below may reference images not displayed]

IMPRESSION: No acute abnormality.

## 2012-10-23 IMAGING — CR DG ABDOMEN 2V
1 series · 4 of 4 positions shown · non-contrast
Comparison: none

REASON FOR EXAM: pain in LLQ, constipated, n/v, r/o ileus obstruction,
stool impaction
COMMENTS:

[Series 1: view not recorded · 0.17mm/px · 4 of 4 slices shown]
[im 1/4]
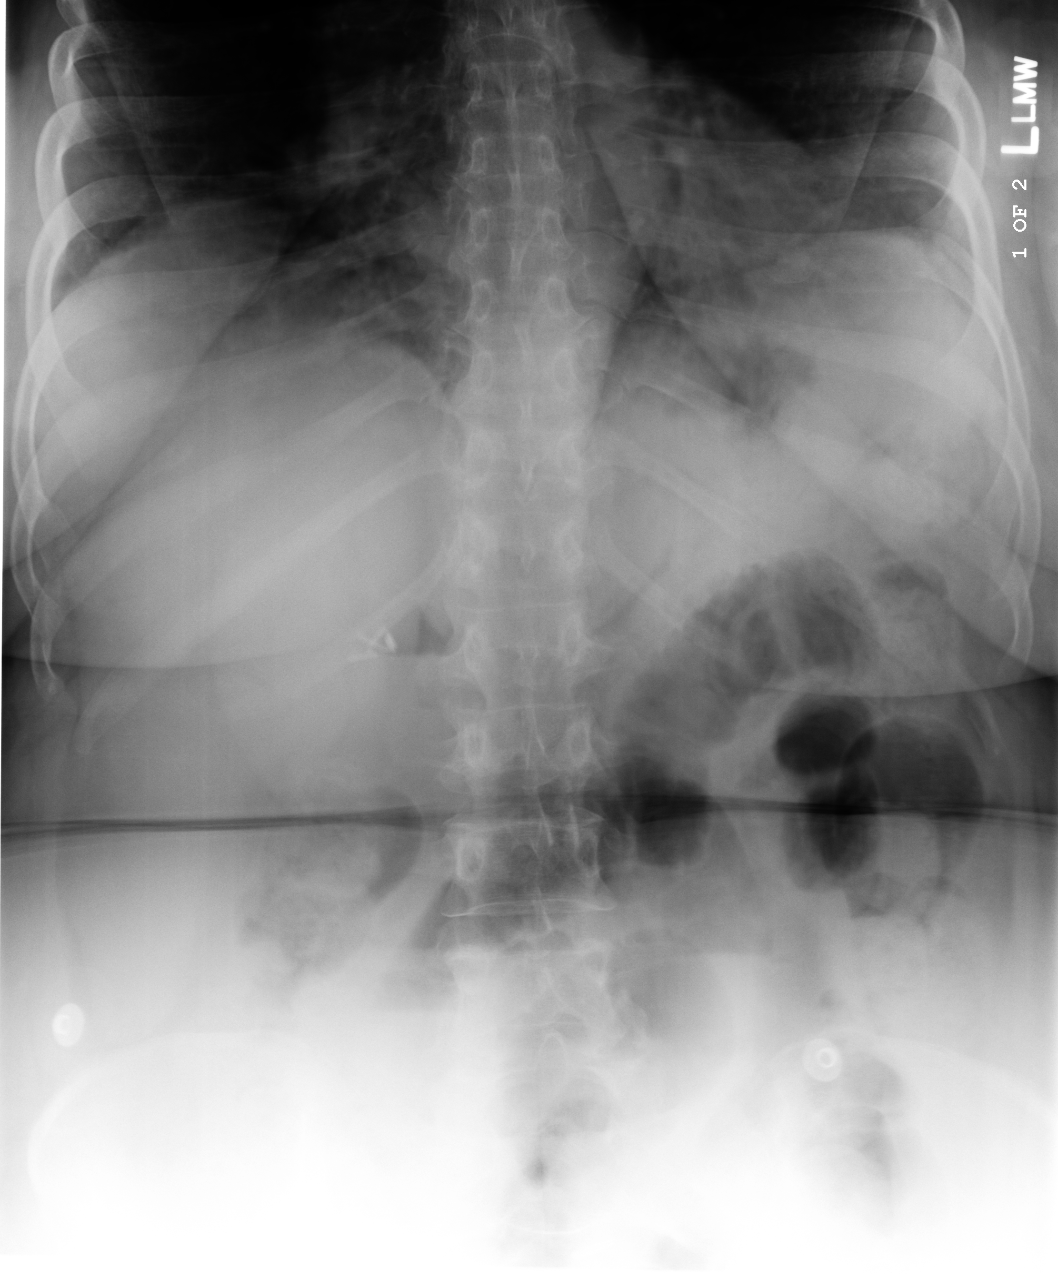
[im 2/4]
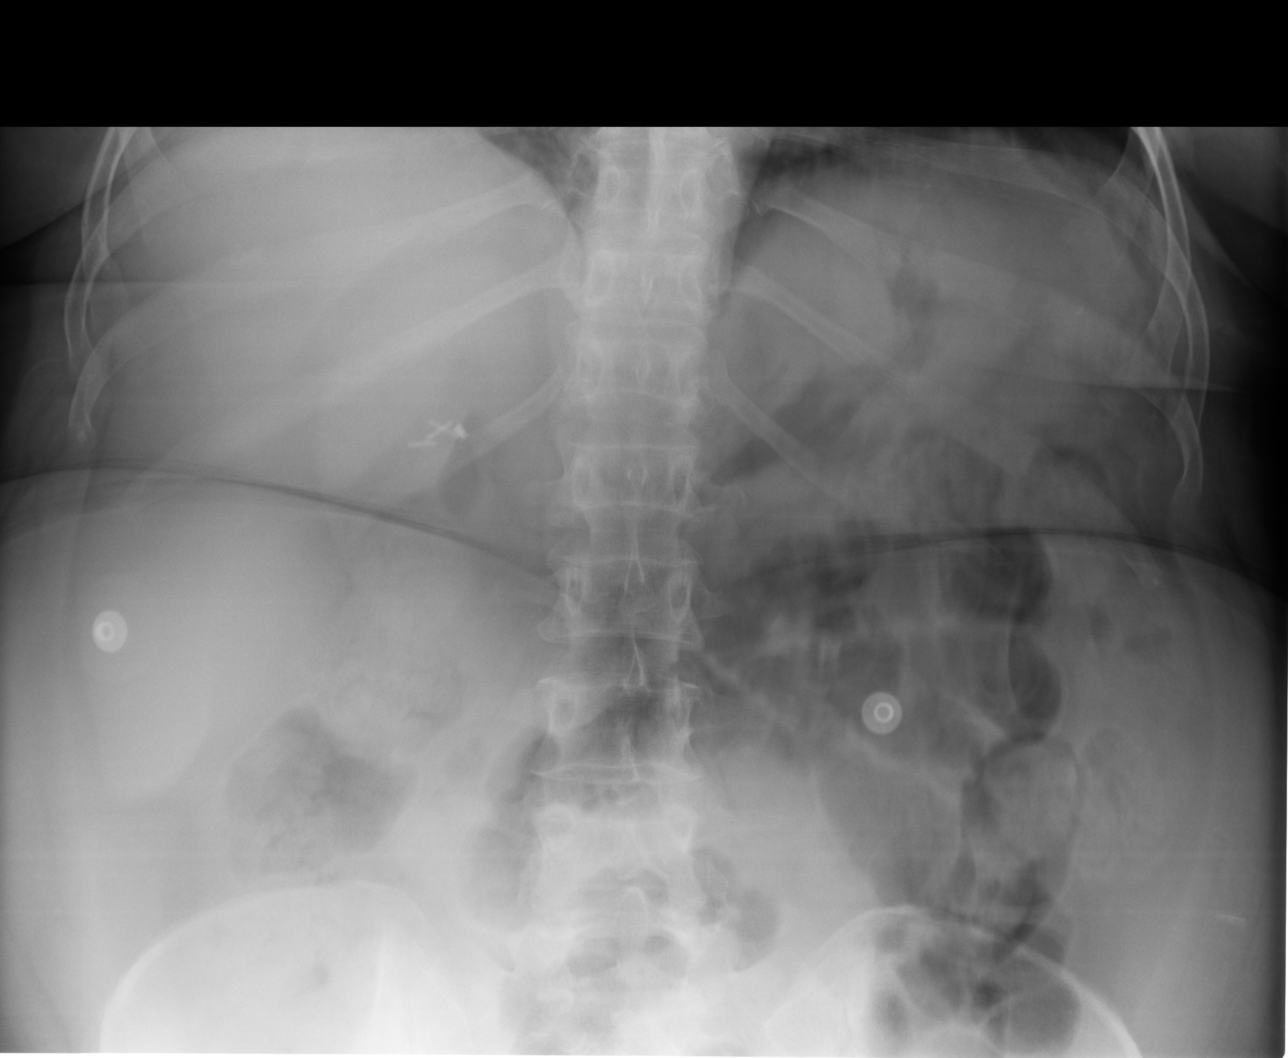
[im 3/4]
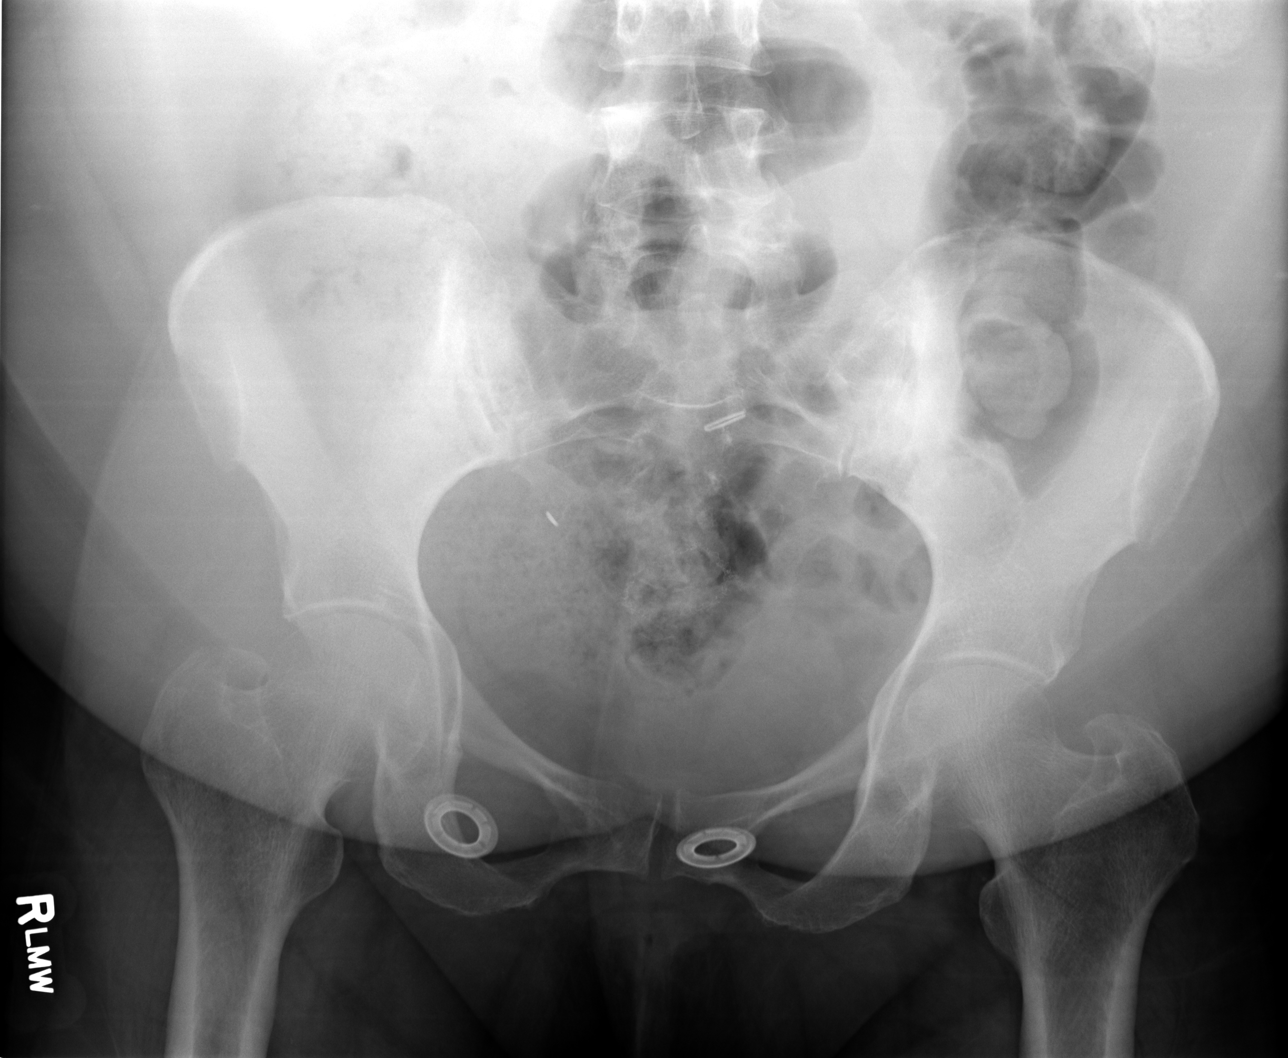
[im 4/4]
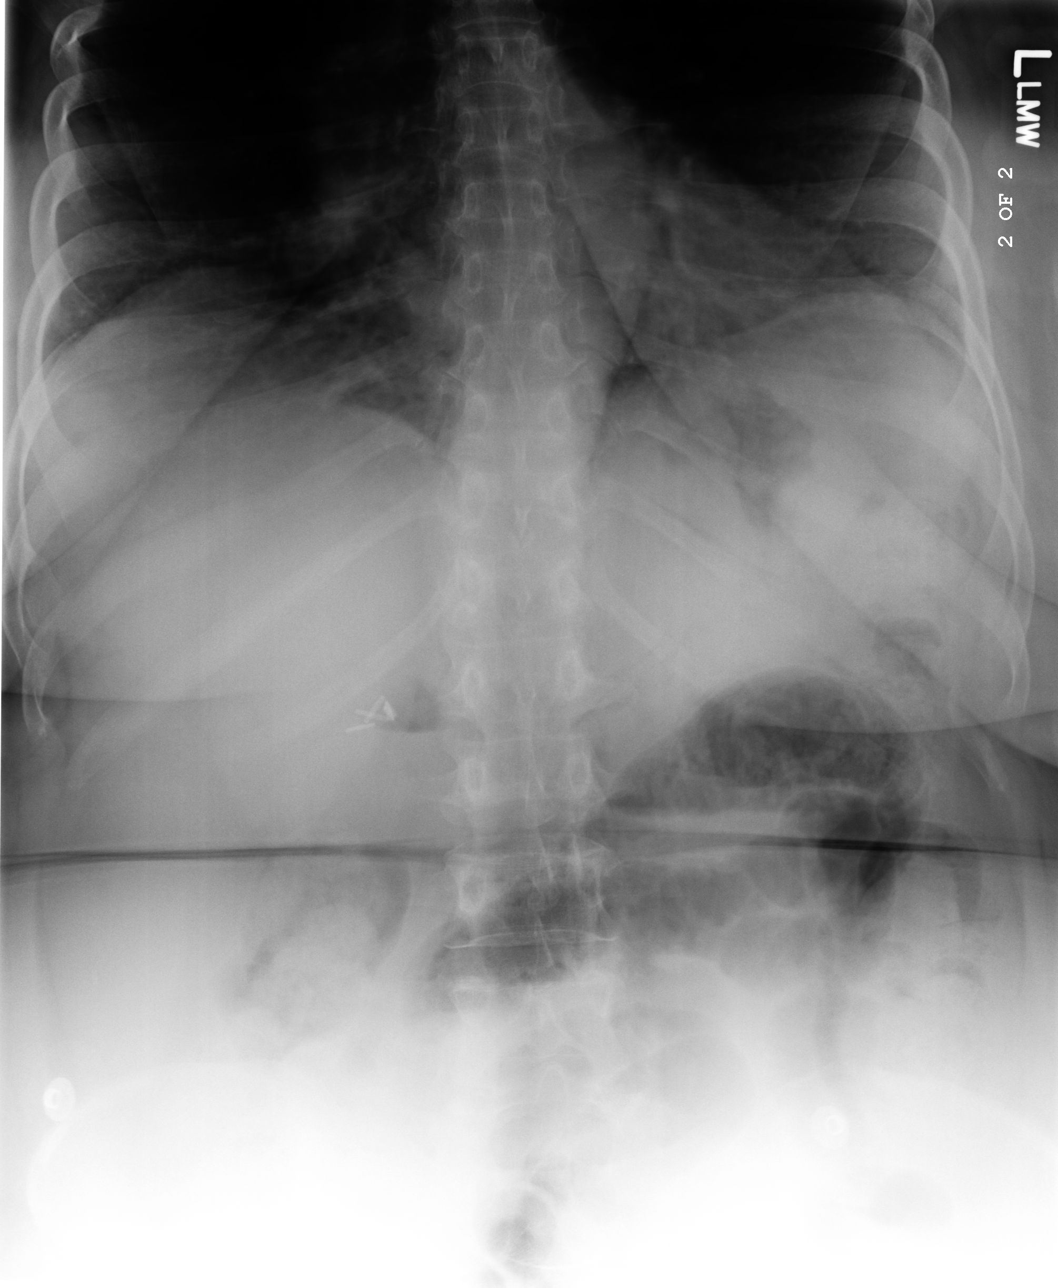

[4 of 4 positions shown; findings below may reference images not displayed]

PROCEDURE:     DXR - DXR ABDOMEN 2 V FLAT AND ERECT  - November 13, 2010  [DATE]

RESULT:     Surgical clips are noted in the right upper quadrant and pelvis.
Several air-filled loops of nondilated small bowel are noted. There is no
free air. Stool is noted in the colon. Colonic gas pattern is nonspecific.
IMPRESSION: Nonspecific exam.

## 2012-10-23 IMAGING — CT CT HEAD WITHOUT CONTRAST
2 series · 16 of 30 positions shown, 20 images · non-contrast
Comparison: none

REASON FOR EXAM: CVA
COMMENTS:   May transport without cardiac monitor

[Series 2: without · axial · non-contrast · 0.43mm/px · z∈[-113,+7]mm · 13 of 30 slices shown, 17 images]
[im 3/30  brain]
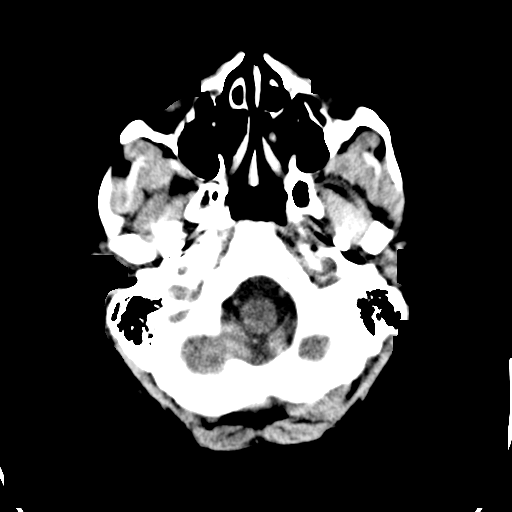
[im 3/30  bone]
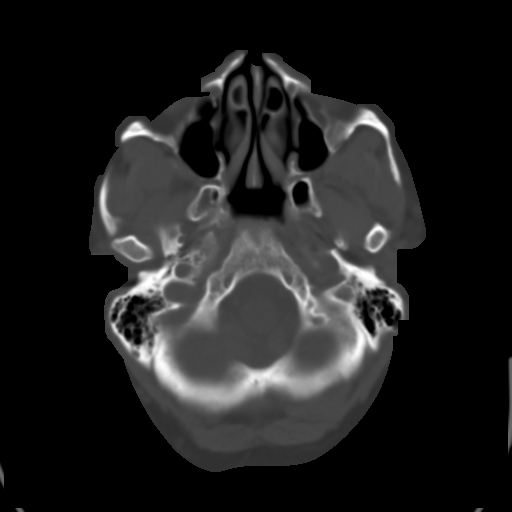
[im 5/30  brain]
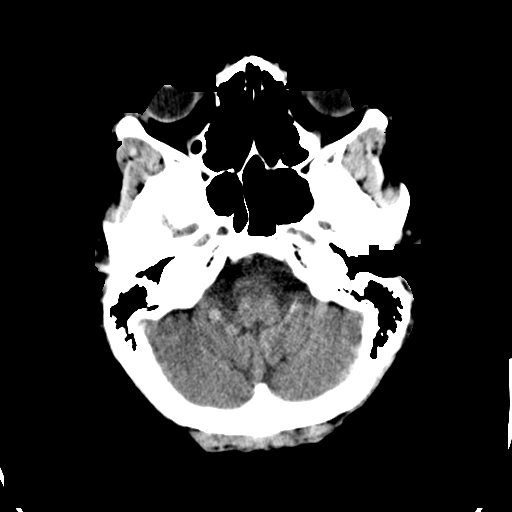
[im 7/30  brain]
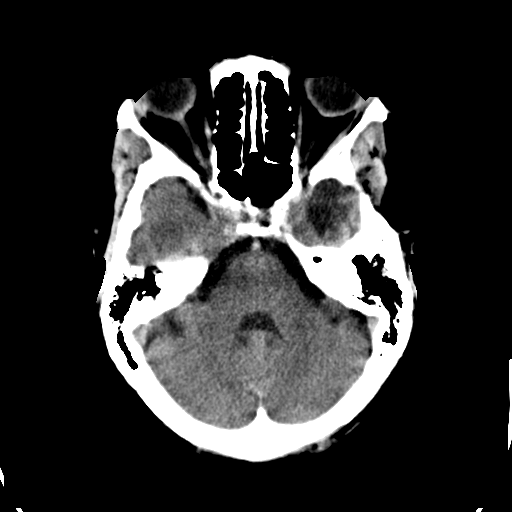
[im 9/30  brain]
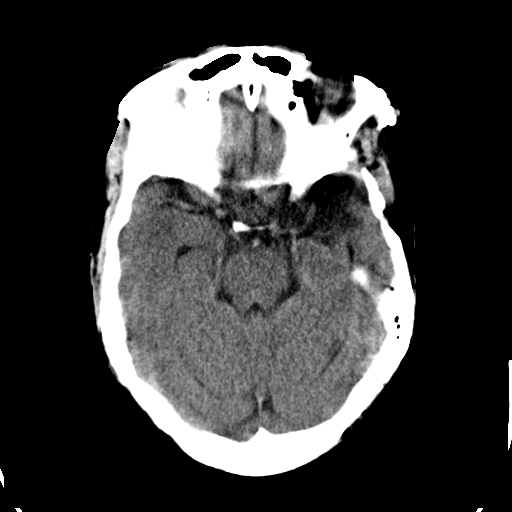
[im 11/30  brain]
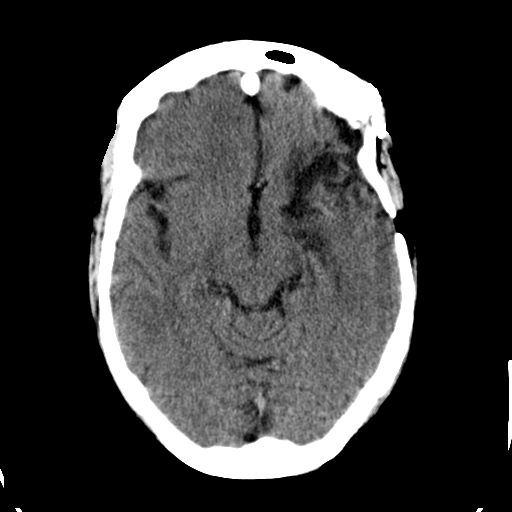
[im 11/30  bone]
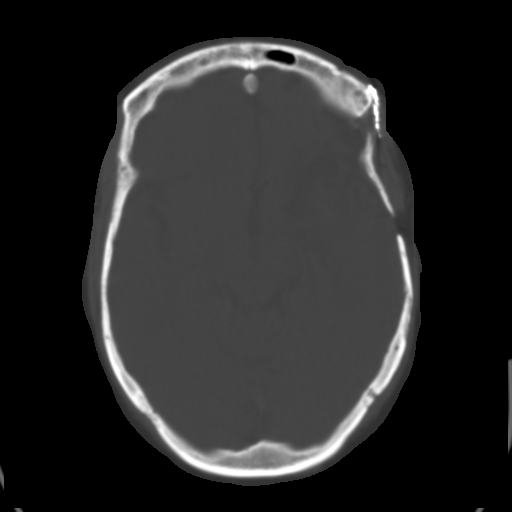
[im 13/30  brain]
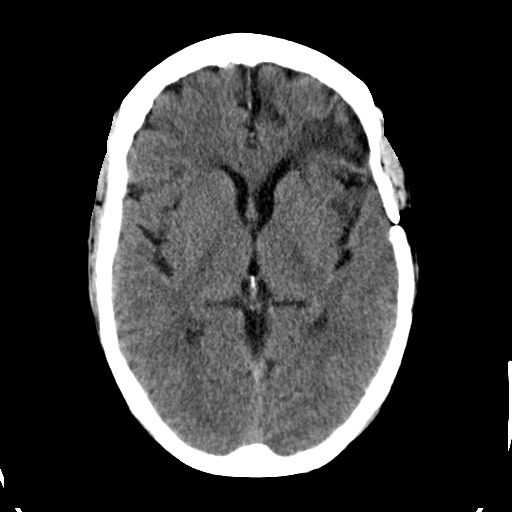
[im 15/30  brain]
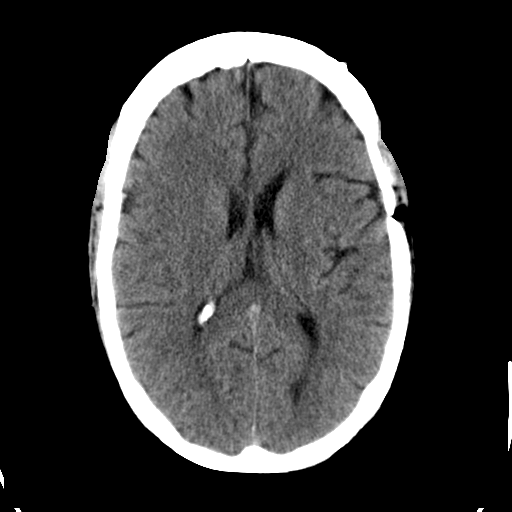
[im 17/30  brain]
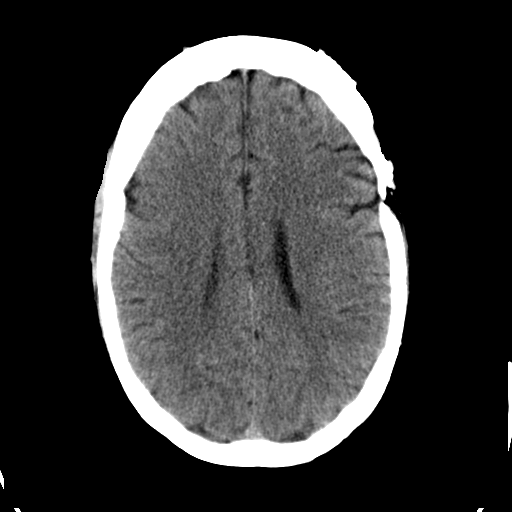
[im 19/30  brain]
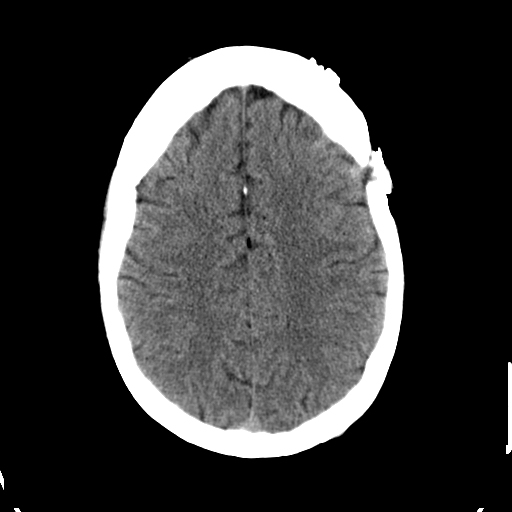
[im 19/30  bone]
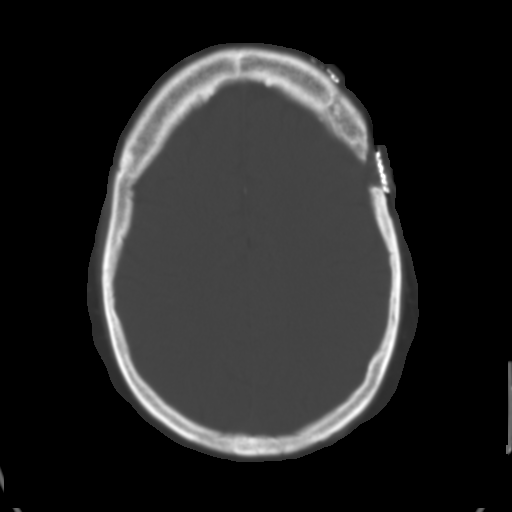
[im 21/30  brain]
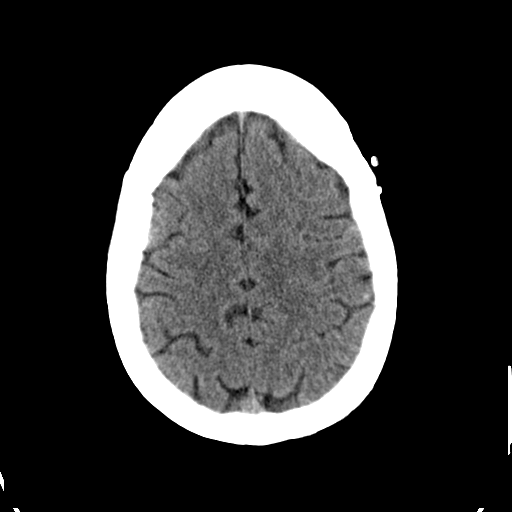
[im 23/30  brain]
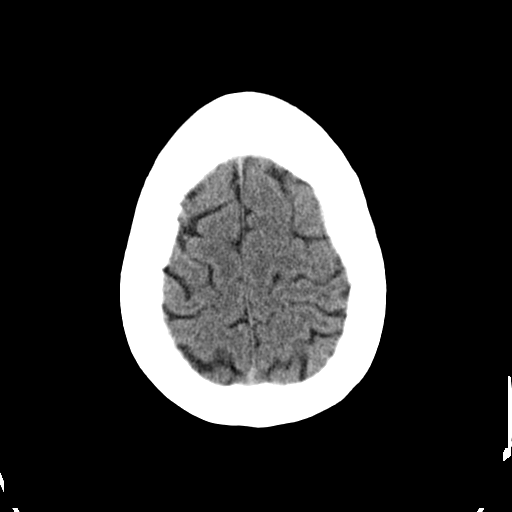
[im 25/30  brain]
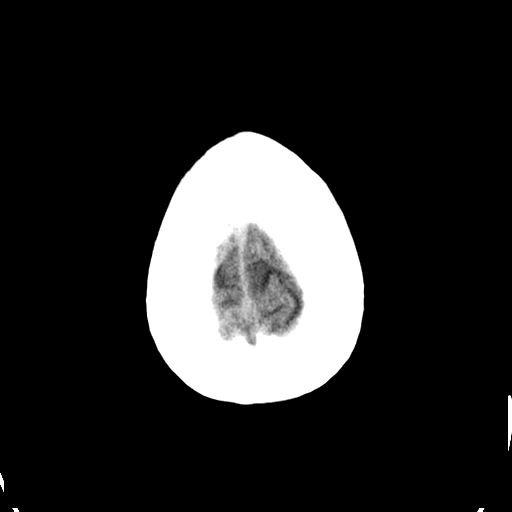
[im 27/30  brain]
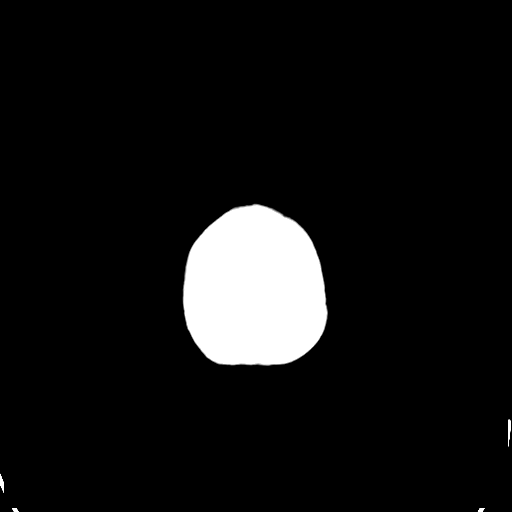
[im 27/30  bone]
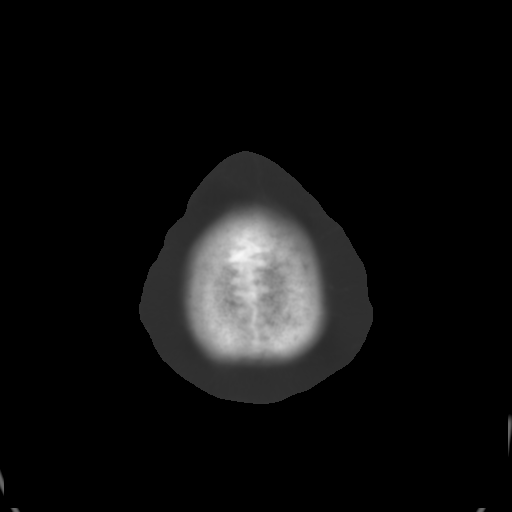

[Series 3: bone · axial · 0.43mm/px · z∈[-113,-73]mm · 3 of 30 slices shown]
[im 3/30  bone]
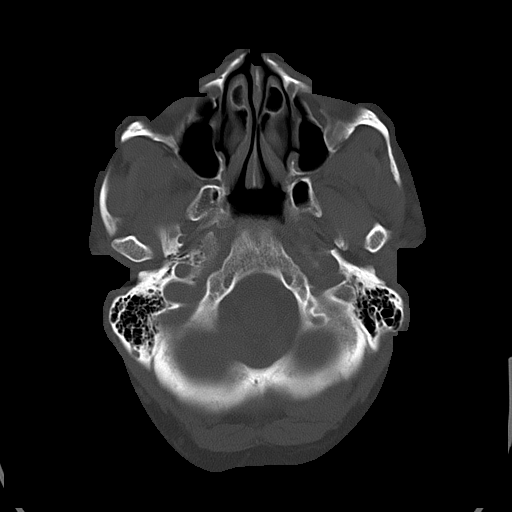
[im 7/30  bone]
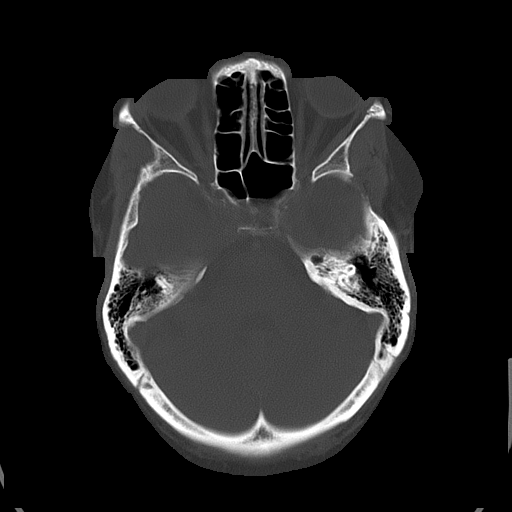
[im 11/30  bone]
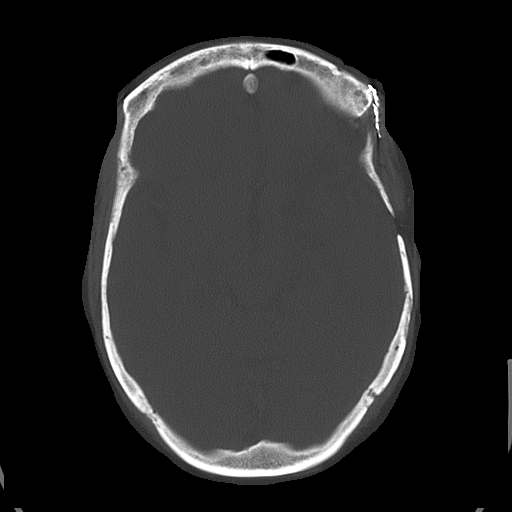

[16 of 30 positions shown; findings below may reference images not displayed]

PROCEDURE:     CT  - CT HEAD WITHOUT CONTRAST  - November 13, 2010  [DATE]

RESULT:     Noncontrast emergent CT of the brain is compared to the previous
examination dated 08/29/2010.

Craniotomy defect is present of the left frontal region and temporal region
extending into the parietal area with underlying encephalomalacia which is
unchanged. There is no evidence of intracranial hemorrhage, mass or mass
effect. There is no midline shift. The remaining brain parenchyma appears
unremarkable. The included sinuses and mastoid air cells show grossly normal
aeration.
IMPRESSION: 1. Changes of previous left-sided craniotomy with underlying
encephalomalacia area no acute intracranial abnormality or significant
interval change observed.

## 2012-11-03 ENCOUNTER — Ambulatory Visit (INDEPENDENT_AMBULATORY_CARE_PROVIDER_SITE_OTHER): Payer: 59 | Admitting: Internal Medicine

## 2012-11-03 ENCOUNTER — Encounter: Payer: Self-pay | Admitting: Internal Medicine

## 2012-11-03 VITALS — BP 100/70 | HR 92 | Temp 97.8°F | Ht 65.25 in | Wt 258.0 lb

## 2012-11-03 DIAGNOSIS — F209 Schizophrenia, unspecified: Secondary | ICD-10-CM | POA: Insufficient documentation

## 2012-11-03 DIAGNOSIS — F1110 Opioid abuse, uncomplicated: Secondary | ICD-10-CM | POA: Insufficient documentation

## 2012-11-03 DIAGNOSIS — R102 Pelvic and perineal pain: Secondary | ICD-10-CM | POA: Insufficient documentation

## 2012-11-03 DIAGNOSIS — B1920 Unspecified viral hepatitis C without hepatic coma: Secondary | ICD-10-CM

## 2012-11-03 DIAGNOSIS — F1010 Alcohol abuse, uncomplicated: Secondary | ICD-10-CM

## 2012-11-03 DIAGNOSIS — G40909 Epilepsy, unspecified, not intractable, without status epilepticus: Secondary | ICD-10-CM | POA: Insufficient documentation

## 2012-11-03 DIAGNOSIS — D329 Benign neoplasm of meninges, unspecified: Secondary | ICD-10-CM | POA: Insufficient documentation

## 2012-11-03 DIAGNOSIS — D320 Benign neoplasm of cerebral meninges: Secondary | ICD-10-CM | POA: Insufficient documentation

## 2012-11-03 DIAGNOSIS — G8929 Other chronic pain: Secondary | ICD-10-CM

## 2012-11-03 DIAGNOSIS — Z1239 Encounter for other screening for malignant neoplasm of breast: Secondary | ICD-10-CM | POA: Insufficient documentation

## 2012-11-03 DIAGNOSIS — N949 Unspecified condition associated with female genital organs and menstrual cycle: Secondary | ICD-10-CM | POA: Insufficient documentation

## 2012-11-03 DIAGNOSIS — F1310 Sedative, hypnotic or anxiolytic abuse, uncomplicated: Secondary | ICD-10-CM | POA: Insufficient documentation

## 2012-11-03 DIAGNOSIS — K861 Other chronic pancreatitis: Secondary | ICD-10-CM | POA: Insufficient documentation

## 2012-11-03 DIAGNOSIS — N390 Urinary tract infection, site not specified: Secondary | ICD-10-CM | POA: Insufficient documentation

## 2012-11-03 DIAGNOSIS — K769 Liver disease, unspecified: Secondary | ICD-10-CM | POA: Insufficient documentation

## 2012-11-03 LAB — POCT URINALYSIS DIPSTICK
Bilirubin, UA: NEGATIVE
Blood, UA: NEGATIVE
Glucose, UA: NEGATIVE
Ketones, UA: NEGATIVE
Nitrite, UA: NEGATIVE
Spec Grav, UA: 1.01
pH, UA: 6

## 2012-11-03 LAB — COMPREHENSIVE METABOLIC PANEL
ALT: 37 U/L — ABNORMAL HIGH (ref 0–35)
AST: 54 U/L — ABNORMAL HIGH (ref 0–37)
CO2: 23 mEq/L (ref 19–32)
Creatinine, Ser: 0.8 mg/dL (ref 0.4–1.2)
GFR: 92.89 mL/min (ref >60.00)
Sodium: 142 mEq/L (ref 135–145)
Total Bilirubin: 0.9 mg/dL (ref 0.3–1.2)
Total Protein: 8.2 g/dL (ref 6.0–8.3)

## 2012-11-03 LAB — CBC WITH DIFFERENTIAL/PLATELET
Basophils Relative: 0.4 % (ref 0.0–3.0)
Eosinophils Relative: 2.4 % (ref 0.0–5.0)
HCT: 32.7 % — ABNORMAL LOW (ref 36.0–46.0)
Hemoglobin: 11.1 g/dL — ABNORMAL LOW (ref 12.0–15.0)
Lymphs Abs: 2.4 10*3/uL (ref 0.7–4.0)
MCV: 87.2 fl (ref 78.0–100.0)
Monocytes Absolute: 0.7 10*3/uL (ref 0.1–1.0)
Monocytes Relative: 9.8 % (ref 3.0–12.0)
Neutro Abs: 3.8 10*3/uL (ref 1.4–7.7)
Platelets: 203 10*3/uL (ref 150.0–400.0)
RBC: 3.75 Mil/uL — ABNORMAL LOW (ref 3.87–5.11)
WBC: 7.1 10*3/uL (ref 4.5–10.5)

## 2012-11-03 LAB — AMYLASE: Amylase: 65 U/L (ref 27–131)

## 2012-11-03 MED ORDER — PHENYTOIN 125 MG/5ML PO SUSP: 300.0000 mg | Freq: Two times a day (BID) | ORAL | Status: DC

## 2012-11-03 MED ORDER — LEVETIRACETAM 500 MG PO TABS: 500.0000 mg | ORAL_TABLET | Freq: Two times a day (BID) | ORAL | Status: DC

## 2012-11-03 MED ORDER — ONDANSETRON 8 MG PO TBDP: 8.0000 mg | ORAL_TABLET | Freq: Three times a day (TID) | ORAL | Status: DC | PRN

## 2012-11-03 NOTE — Assessment & Plan Note (Signed)
Pt requested narcotics several times during this initial visit. No clear findings on physical exam or in review of chart from [facility] or [facility] to explain need for narcotics for pain management. In addition, several claims made by patient in regards to health issues (recurrent brain tumor, on liver transplant list) could not be confirmed with review of records. Discussed potential referral to pain management if necessary for chronic abdominal pain. Will also send urine drug screen.

## 2012-11-03 NOTE — Assessment & Plan Note (Signed)
Per [facility] notes, s/p resection in 2005. Pt reports recent recurrence, which is being followed at [facility] and leading to severe left scalp pain.  I cannot find any notes documenting this through [facility] on Epic from [facility].  Will request any available additional records from [facility]. No abnormalities of the scalp noted on exam today to explain severe pain.

## 2012-11-03 NOTE — Assessment & Plan Note (Signed)
Pt reports chronic left pelvic pain. Notes from [facility] suggest GYN evaluation for rectovaginal fistula in the past. Exam is limited by morbid obesity. However, recent abdominal US from [facility] showed no acute pathology. Likely will need repeat transvaginal US. Will set up repeat GYN evaluation.

## 2012-11-03 NOTE — Assessment & Plan Note (Signed)
Pt reports severe pain from chronic pancreatitis. Requests "120 percocet" per month as prescribed by her former PCP. Amylase and Lipase normal today. No clear evidence of pancreatitis. No notes on this in Epic CareEverywhere. Will request notes from [place], [place], [employer]. Will set up GI evaluation.

## 2012-11-03 NOTE — Progress Notes (Signed)
Subjective:    Patient ID: [name], female    DOB: October 15, 1955, 57 y.o.   MRN: [id number]  HPI 57 year old female with reported history of chronic pancreatitis, brain tumor status post resection, tobacco abuse, alcohol abuse, hepatitis C, chronic liver disease, obesity presents to establish care. She reports that she was recently discharged from her former physician because of failure to show at an appointment. She reports that she " has chronic pancreatitis "and is out of her pain medication. She reports that she needs "120 Percocet per month "as prescribed by her former primary care physician in order to control her pain. She would like to be prescribed this today.  In regards to chronic pancreatitis, she reports she has been followed both locally and at [facility]. She reports multiple recent ER evaluations for pain. She denies current nausea and vomiting. She reports that she has diffuse abdominal distention and "fluid in her abdomen which needs to be drained." She reports that she was told this by a previous physician. Pulled reports from [facility] via [employer] with pt in room, after permission obtained. Review of recent US at [facility] from 10/2012 showed no ascites, however chronic liver changes were noted. She reports that she is on the liver transplant list at [facility]. There is no documentation of this in the [facility] chart. There is no documentation of recent evaluation for Hep C, aside from LFTs which were elevated.  In regards to history of brain tumor, she reports resection completed in 2005. She reports she was recently told at [facility] that she had recurrence. She reports significant pain in the left side of her scalp. She would like pain medication for this. She denies any focal neurologic deficits. She denies any recent seizures. She reports that she is almost out of her seizure medications and needs refills. Reviewed the reports from [facility] via [employer]. Notes from 2005 document resection of  meningioma. No recent imaging seen of the head or neck. No documentation of recurrent tumor.  She also reports history of chronic pelvic pain. Reviewed notes from [facility] showed repair of rectovaginal fistula several years ago. She reports that pain in the low pelvis has been persistent over the last several years. Described as aching. This has not recently been evaluated. She notes mild dysuria and urinary frequency. No fever, chills. Recently treated in our clinic for UTI. Would like refill on Percocet to help with pain.   Outpatient Encounter Prescriptions as of 11/03/2012  Medication Sig Dispense Refill  . albuterol (PROVENTIL) (5 MG/ML) 0.5% nebulizer solution Take 2.5 mg by nebulization 4 (four) times daily.      Marland Kitchen ALPRAZolam (XANAX) 1 MG tablet Take 1 tablet (1 mg total) by mouth daily.  30 tablet  0  . amitriptyline (ELAVIL) 50 MG tablet Take 50 mg by mouth at bedtime.      Marland Kitchen amLODipine (NORVASC) 10 MG tablet Take 1 tablet (10 mg total) by mouth daily.  30 tablet  4  . amylase-lipase-protease (LIPRAM-CR10) 33.08-18-35.5 MU per capsule Take by mouth 3 (three) times daily with meals.      Marland Kitchen CREON 12000 UNITS CPEP       . escitalopram (LEXAPRO) 10 MG tablet Take 1 tablet (10 mg total) by mouth daily.  30 tablet  4  . Fluticasone-Salmeterol (ADVAIR) 250-50 MCG/DOSE AEPB Inhale 1 puff into the lungs as needed.      . lactulose (CHRONULAC) 10 GM/15ML solution       . levETIRAcetam (KEPPRA) 500 MG tablet  Take 1 tablet (500 mg total) by mouth 2 (two) times daily.  60 tablet  3  . metoprolol succinate (TOPROL-XL) 25 MG 24 hr tablet Take 1 tablet (25 mg total) by mouth daily.  30 tablet  4  . omeprazole (PRILOSEC) 40 MG capsule Take 40 mg by mouth 2 (two) times daily.      Marland Kitchen oxyCODONE (OXY IR/ROXICODONE) 5 MG immediate release tablet Take 1 tablet (5 mg total) by mouth every 6 (six) hours.  30 tablet  0  . phenytoin (DILANTIN) 125 MG/5ML suspension Take 12 mLs (300 mg total) by mouth 2 (two) times daily.   237 mL  3  . QUEtiapine (SEROQUEL) 100 MG tablet Take 3.5 tablets (350 mg total) by mouth daily.  105 tablet  4  . spironolactone (ALDACTONE) 50 MG tablet Take 1 tablet (50 mg total) by mouth 3 (three) times daily.  90 tablet  3  . tiotropium (SPIRIVA) 18 MCG inhalation capsule Place 18 mcg into inhaler and inhale as needed.      . zolpidem (AMBIEN) 10 MG tablet Take 1 tablet (10 mg total) by mouth at bedtime.  30 tablet  3  . [DISCONTINUED] levETIRAcetam (KEPPRA) 500 MG tablet Take 500 mg by mouth 2 (two) times daily.      . [DISCONTINUED] phenytoin (DILANTIN) 125 MG/5ML suspension Take 300 mg by mouth 2 (two) times daily.      . cephALEXin (KEFLEX) 500 MG capsule Take 1 capsule (500 mg total) by mouth 2 (two) times daily.  20 capsule  0  . ondansetron (ZOFRAN-ODT) 8 MG disintegrating tablet Take 1 tablet (8 mg total) by mouth every 8 (eight) hours as needed for nausea.  20 tablet  0  . [DISCONTINUED] fluconazole (DIFLUCAN) 150 MG tablet Take 1 tablet (150 mg total) by mouth once.  3 tablet  0   No facility-administered encounter medications on file as of 11/03/2012.   BP 100/70  Pulse 92  Temp(Src) 97.8 F (36.6 C) (Oral)  Ht 5' 5.25" (1.657 m)  Wt 258 lb (117.028 kg)  BMI 42.62 kg/m2  SpO2 95%  Review of Systems  Constitutional: Positive for fatigue. Negative for fever, chills, appetite change and unexpected weight change.  HENT: Negative for ear pain, congestion, sore throat, trouble swallowing, neck pain, voice change and sinus pressure.   Eyes: Negative for visual disturbance.  Respiratory: Positive for cough. Negative for shortness of breath, wheezing and stridor.   Cardiovascular: Negative for chest pain, palpitations and leg swelling.  Gastrointestinal: Positive for abdominal pain and abdominal distention. Negative for nausea, vomiting, diarrhea, constipation, blood in stool and anal bleeding.  Genitourinary: Positive for dysuria and pelvic pain. Negative for urgency, hematuria  and flank pain.  Musculoskeletal: Positive for myalgias and arthralgias. Negative for gait problem.  Skin: Negative for color change and rash.  Neurological: Negative for dizziness and headaches.  Hematological: Negative for adenopathy. Does not bruise/bleed easily.  Psychiatric/Behavioral: Negative for suicidal ideas, sleep disturbance and dysphoric mood. The patient is not nervous/anxious.        Objective:   Physical Exam  Constitutional: She is oriented to person, place, and time. She appears well-developed and well-nourished. No distress.  HENT:  Head: Normocephalic and atraumatic.  Right Ear: External ear normal.  Left Ear: External ear normal.  Nose: Nose normal.  Mouth/Throat: Oropharynx is clear and moist. No oropharyngeal exudate.  Eyes: Conjunctivae are normal. Pupils are equal, round, and reactive to light. Right eye exhibits no discharge. Left eye  exhibits no discharge. No scleral icterus.  Neck: Normal range of motion. Neck supple. No tracheal deviation present. No thyromegaly present.  Cardiovascular: Normal rate, regular rhythm, normal heart sounds and intact distal pulses.  Exam reveals no gallop and no friction rub.   No murmur heard. Pulmonary/Chest: Effort normal and breath sounds normal. No accessory muscle usage. Not tachypneic. No respiratory distress. She has no decreased breath sounds. She has no wheezes. She has no rhonchi. She has no rales. She exhibits no tenderness.  Abdominal: Soft. Bowel sounds are normal. She exhibits no distension. There is no tenderness.  Musculoskeletal: Normal range of motion. She exhibits no edema and no tenderness.  Lymphadenopathy:    She has no cervical adenopathy.  Neurological: She is alert and oriented to person, place, and time. No cranial nerve deficit. She exhibits normal muscle tone. Coordination normal.  Skin: Skin is warm and dry. No rash noted. She is not diaphoretic. No erythema. No pallor.  Psychiatric: Her speech is  normal. Judgment and thought content normal. Her mood appears anxious. She is agitated.          Assessment & Plan:  Over spent of which >50% was face to face with patient reviewing plan of care for multiple medical issues.

## 2012-11-03 NOTE — Assessment & Plan Note (Signed)
Per notes from [facility]. Will request records from [facility], [place], [place]. Not clear that she has recently seen psychiatry. Referral may be helpful for medication management, but will wait on notes. In interim, continue seroquel

## 2012-11-03 NOTE — Assessment & Plan Note (Signed)
Pt reports dysuria. Requests percocet for pain. Urinalysis normal. Will send urine for culture.

## 2012-11-03 NOTE — Assessment & Plan Note (Signed)
Pt reports CLD from Hep C. She reports she is on the liver transplant list and followed by GI at [facility]. I cannot find any documentation of this through Epic CareEverywhere. Will request notes [place], [place], [employer]. LFTs elevated today. Will set up GI evaluation at [facility].

## 2012-11-03 NOTE — Assessment & Plan Note (Signed)
Per pt and [facility] notes. Not currently followed by ID physician. Will request additional notes from [facility]. Will set up ID evaluation.

## 2012-11-03 NOTE — Assessment & Plan Note (Signed)
No recent seizures. Will refill Diltiazem and Keppra as per pt record. Will request all notes [place], [place].

## 2012-11-03 NOTE — Assessment & Plan Note (Signed)
Per pt and notes, sober x 2 months. Will get urine drug screen.

## 2012-11-04 ENCOUNTER — Ambulatory Visit: Payer: 59

## 2012-11-04 DIAGNOSIS — K861 Other chronic pancreatitis: Secondary | ICD-10-CM

## 2012-11-04 LAB — DRUG SCREEN, URINE
Amphetamine Screen, Ur: NEGATIVE
Barbiturate Quant, Ur: NEGATIVE
Benzodiazepines.: NEGATIVE
Cocaine Metabolites: NEGATIVE
Marijuana Metabolite: NEGATIVE

## 2012-11-04 LAB — HEMOGLOBIN A1C: Hgb A1c MFr Bld: 6.2 % (ref 4.6–6.5)

## 2012-11-05 ENCOUNTER — Encounter: Payer: Self-pay | Admitting: *Deleted

## 2012-11-06 LAB — URINE CULTURE: Colony Count: >=100000

## 2012-11-11 ENCOUNTER — Other Ambulatory Visit: Payer: Self-pay | Admitting: *Deleted

## 2012-11-11 MED ORDER — CIPROFLOXACIN HCL 500 MG PO TABS: 500.0000 mg | ORAL_TABLET | Freq: Two times a day (BID) | ORAL | Status: DC

## 2012-11-11 NOTE — Telephone Encounter (Signed)
Rx faxed and patient aware.  

## 2012-11-12 ENCOUNTER — Emergency Department: Payer: Self-pay | Admitting: Emergency Medicine

## 2012-11-12 LAB — URINALYSIS, COMPLETE
Bilirubin,UR: NEGATIVE
Blood: NEGATIVE
Glucose,UR: NEGATIVE mg/dL (ref 0–75)
Ketone: NEGATIVE
Nitrite: NEGATIVE
Ph: 6 (ref 4.5–8.0)
Specific Gravity: 1.009 (ref 1.003–1.030)

## 2012-11-12 LAB — COMPREHENSIVE METABOLIC PANEL
Alkaline Phosphatase: 148 U/L — ABNORMAL HIGH (ref 50–136)
BUN: 9 mg/dL (ref 7–18)
Bilirubin,Total: 1.2 mg/dL — ABNORMAL HIGH (ref 0.2–1.0)
Creatinine: 0.71 mg/dL (ref 0.60–1.30)
EGFR (African American): >60
EGFR (Non-African Amer.): >60
Glucose: 89 mg/dL (ref 65–99)
Potassium: 4 mmol/L (ref 3.5–5.1)
SGOT(AST): 74 U/L — ABNORMAL HIGH (ref 15–37)
SGPT (ALT): 41 U/L (ref 12–78)
Total Protein: 8.6 g/dL — ABNORMAL HIGH (ref 6.4–8.2)

## 2012-11-12 LAB — CK TOTAL AND CKMB (NOT AT ARMC)
CK, Total: 215 U/L (ref 21–215)
CK-MB: 1.2 ng/mL (ref 0.5–3.6)

## 2012-11-12 LAB — CBC
HCT: 30.1 % — ABNORMAL LOW (ref 35.0–47.0)
HGB: 10.1 g/dL — ABNORMAL LOW (ref 12.0–16.0)
MCH: 29.4 pg (ref 26.0–34.0)
Platelet: 189 10*3/uL (ref 150–440)
RBC: 3.44 10*6/uL — ABNORMAL LOW (ref 3.80–5.20)

## 2012-11-12 LAB — TROPONIN I: Troponin-I: <0.02 ng/mL

## 2012-11-12 LAB — LIPASE, BLOOD: Lipase: 177 U/L (ref 73–393)

## 2012-11-12 LAB — PRO B NATRIURETIC PEPTIDE: B-Type Natriuretic Peptide: 33 pg/mL (ref 0–125)

## 2012-11-16 ENCOUNTER — Other Ambulatory Visit: Payer: Self-pay | Admitting: *Deleted

## 2012-11-16 ENCOUNTER — Emergency Department: Payer: Self-pay | Admitting: Emergency Medicine

## 2012-11-16 MED ORDER — OMEPRAZOLE 40 MG PO CPDR: 40.0000 mg | DELAYED_RELEASE_CAPSULE | Freq: Two times a day (BID) | ORAL | Status: DC

## 2012-11-16 NOTE — Telephone Encounter (Signed)
Patient would like refill on Ambien.

## 2012-11-16 NOTE — Telephone Encounter (Signed)
It is too early to refill Ambien. This was called 4/16 with 30pills and 3 refills. Fine to refill Omeprazole.

## 2012-11-17 ENCOUNTER — Ambulatory Visit: Payer: Self-pay | Admitting: Internal Medicine

## 2012-11-17 ENCOUNTER — Telehealth: Payer: Self-pay | Admitting: Internal Medicine

## 2012-11-17 LAB — URINALYSIS, COMPLETE
Blood: NEGATIVE
Glucose,UR: NEGATIVE mg/dL (ref 0–75)
Nitrite: POSITIVE
Protein: NEGATIVE
Squamous Epithelial: 2
WBC UR: 9 /HPF (ref 0–5)

## 2012-11-17 NOTE — Telephone Encounter (Signed)
Caller: [name]/Patient; Phone: [phone]; Reason for Call: Patient would like to cancel appt with physician today at 15; 15.  She reports that she was seen in ER last night with [facility]). Reviewed EPIC. Appt today with Dr. Lorin Picket cancelled per request.

## 2012-11-18 ENCOUNTER — Telehealth: Payer: Self-pay | Admitting: Internal Medicine

## 2012-11-18 NOTE — Telephone Encounter (Signed)
Patient Information:  Caller Name: Rashada  Phone: [phone]  Patient: [name]  Gender: Female  DOB: [birth year]  Age: 57 Years  PCP: Ronna Polio (Adults only)  Pregnant: No  Office Follow Up:  Does the office need to follow up with this patient?: No  Instructions For The Office: N/A  RN Note:  Percocet 5/12 used every 6 hours are not helping pain. Used Tylenol for fever inspite of containdication due to liver disease. Reports bilateral severe flank pain rated 11/10. Reports passed out twice today she related to coughing so hard she passes out. Agreed to return to [facility] ED now.   Symptoms  Reason For Call & Symptoms: Reports ongoing fever with abdominal pain and bilateral flank pain.  Urine is dark yellow with strong odor. Blood noted on tissue after voiding.  Kept overnight in ED at [facility] 11/17/12.  Treated with IV antibitiocs and IV hydration.  Continues on Macrodantin now.  Asking for arrangements to be made for admission because the ED told her only the PCP can admit.  Reviewed Health History In EMR: Yes  Reviewed Medications In EMR: Yes  Reviewed Allergies In EMR: Yes  Reviewed Surgeries / Procedures: Yes  Date of Onset of Symptoms: 11/11/2012  Treatments Tried: Tylenol, Percocet 5/12  Treatments Tried Worked: Yes  Any Fever: Yes  Fever Taken: Oral  Fever Time Of Reading: 13:30:00  Fever Last Reading: 102 OB / GYN:  LMP: Unknown  Guideline(s) Used:  Flank Pain  Disposition Per Guideline:   Go to ED Now  Reason For Disposition Reached:   Severe pain (e.g., excruciating, scale 8-10) and present > 1 hour  Advice Given:  N/A  Patient Will Follow Care Advice:  YES

## 2012-11-18 NOTE — Telephone Encounter (Signed)
Fwd to Dr. Walker 

## 2012-11-24 LAB — BASIC METABOLIC PANEL
Anion Gap: 8 (ref 7–16)
Calcium, Total: 9.2 mg/dL (ref 8.5–10.1)
Chloride: 106 mmol/L (ref 98–107)
Co2: 24 mmol/L (ref 21–32)
Creatinine: 0.97 mg/dL (ref 0.60–1.30)
EGFR (African American): >60
EGFR (Non-African Amer.): >60
Glucose: 158 mg/dL — ABNORMAL HIGH (ref 65–99)
Osmolality: 277 (ref 275–301)
Potassium: 3.4 mmol/L — ABNORMAL LOW (ref 3.5–5.1)
Sodium: 138 mmol/L (ref 136–145)

## 2012-11-24 LAB — URINALYSIS, COMPLETE
Bilirubin,UR: NEGATIVE
Blood: NEGATIVE
Ketone: NEGATIVE
Nitrite: NEGATIVE
RBC,UR: 1 /HPF (ref 0–5)
Specific Gravity: 1.004 (ref 1.003–1.030)
WBC UR: 2 /HPF (ref 0–5)

## 2012-11-24 LAB — CBC
HCT: 34.9 % — ABNORMAL LOW (ref 35.0–47.0)
Platelet: 221 10*3/uL (ref 150–440)
RBC: 3.95 10*6/uL (ref 3.80–5.20)
RDW: 15.5 % — ABNORMAL HIGH (ref 11.5–14.5)
WBC: 10.1 10*3/uL (ref 3.6–11.0)

## 2012-11-25 ENCOUNTER — Observation Stay: Payer: Self-pay | Admitting: Internal Medicine

## 2012-11-25 LAB — CK TOTAL AND CKMB (NOT AT ARMC)
CK, Total: 142 U/L (ref 21–215)
CK-MB: 0.8 ng/mL (ref 0.5–3.6)
CK-MB: 0.9 ng/mL (ref 0.5–3.6)

## 2012-11-25 LAB — HEPATIC FUNCTION PANEL A (ARMC)
Albumin: 3.3 g/dL — ABNORMAL LOW (ref 3.4–5.0)
Bilirubin,Total: 0.6 mg/dL (ref 0.2–1.0)
SGPT (ALT): 45 U/L (ref 12–78)
Total Protein: 8.9 g/dL — ABNORMAL HIGH (ref 6.4–8.2)

## 2012-11-25 LAB — TROPONIN I: Troponin-I: <0.02 ng/mL

## 2012-11-26 LAB — CBC WITH DIFFERENTIAL/PLATELET
Basophil %: 0.1 %
Eosinophil %: 0 %
HGB: 10.8 g/dL — ABNORMAL LOW (ref 12.0–16.0)
Lymphocyte #: 1 10*3/uL (ref 1.0–3.6)
Lymphocyte %: 16.5 %
MCH: 28.4 pg (ref 26.0–34.0)
MCV: 88 fL (ref 80–100)
Neutrophil #: 5.1 10*3/uL (ref 1.4–6.5)
Platelet: 175 10*3/uL (ref 150–440)
RBC: 3.79 10*6/uL — ABNORMAL LOW (ref 3.80–5.20)
RDW: 15 % — ABNORMAL HIGH (ref 11.5–14.5)
WBC: 6.2 10*3/uL (ref 3.6–11.0)

## 2012-11-26 LAB — BASIC METABOLIC PANEL
BUN: 14 mg/dL (ref 7–18)
Calcium, Total: 9.3 mg/dL (ref 8.5–10.1)
Chloride: 106 mmol/L (ref 98–107)
Creatinine: 1.08 mg/dL (ref 0.60–1.30)
EGFR (Non-African Amer.): 58 — ABNORMAL LOW
Osmolality: 279 (ref 275–301)

## 2012-12-01 ENCOUNTER — Other Ambulatory Visit: Payer: Self-pay | Admitting: *Deleted

## 2012-12-01 MED ORDER — AMITRIPTYLINE HCL 50 MG PO TABS: 50.0000 mg | ORAL_TABLET | Freq: Every day | ORAL | Status: DC

## 2012-12-01 NOTE — Telephone Encounter (Signed)
Eprescribed.

## 2012-12-01 NOTE — Addendum Note (Signed)
Addended by: Theola Sequin on: 12/01/2012 10:24 AM   Modules accepted: Orders

## 2012-12-01 NOTE — Telephone Encounter (Signed)
amitriptyline (ELAVIL) 50 MG tablet   amLODipine (NORVASC) 10 MG tablet  Patient is currently out of her medication and she is wanting a call when the medication is filled .

## 2012-12-02 ENCOUNTER — Telehealth: Payer: Self-pay

## 2012-12-02 NOTE — Telephone Encounter (Signed)
Notified Elmer Bales that it is fine to start Occupational therapy

## 2012-12-02 NOTE — Telephone Encounter (Signed)
Elmer Bales from [facility] called and wanted to know is okay for her to do some Occupational Therapy with the patient. She is already going over the patient home to do Physical Therapy, patient is still has a lot of weakness and trouble bathing. Irving Burton just needs a verbal okay.

## 2012-12-02 NOTE — Telephone Encounter (Signed)
Fine to start 

## 2012-12-08 DIAGNOSIS — M6281 Muscle weakness (generalized): Secondary | ICD-10-CM

## 2012-12-08 DIAGNOSIS — I69998 Other sequelae following unspecified cerebrovascular disease: Secondary | ICD-10-CM

## 2012-12-08 DIAGNOSIS — N824 Other female intestinal-genital tract fistulae: Secondary | ICD-10-CM

## 2012-12-08 DIAGNOSIS — R55 Syncope and collapse: Secondary | ICD-10-CM

## 2012-12-14 ENCOUNTER — Ambulatory Visit: Payer: 59 | Admitting: Internal Medicine

## 2012-12-16 ENCOUNTER — Telehealth: Payer: Self-pay | Admitting: Internal Medicine

## 2012-12-16 MED ORDER — NEBULIZER DEVI: Status: DC

## 2012-12-16 NOTE — Telephone Encounter (Signed)
Ok to refill 

## 2012-12-16 NOTE — Telephone Encounter (Signed)
Fine to refill 

## 2012-12-16 NOTE — Telephone Encounter (Signed)
Patient in the process of moving and has loss her nebulizer . She is needing a prescription for another nebulizer.

## 2012-12-18 ENCOUNTER — Telehealth: Payer: Self-pay | Admitting: Internal Medicine

## 2012-12-18 NOTE — Telephone Encounter (Signed)
Fwd to Dr. Walker 

## 2012-12-18 NOTE — Telephone Encounter (Signed)
Patient needing more time with home health for physical therapy and nursing. Her time will run out on Monday 6.16.14. Please renew .

## 2012-12-22 ENCOUNTER — Encounter: Payer: Self-pay | Admitting: Emergency Medicine

## 2012-12-23 ENCOUNTER — Emergency Department: Payer: Self-pay | Admitting: Emergency Medicine

## 2012-12-23 LAB — COMPREHENSIVE METABOLIC PANEL
Alkaline Phosphatase: 176 U/L — ABNORMAL HIGH (ref 50–136)
BUN: 7 mg/dL (ref 7–18)
Bilirubin,Total: 0.7 mg/dL (ref 0.2–1.0)
Chloride: 109 mmol/L — ABNORMAL HIGH (ref 98–107)
Co2: 26 mmol/L (ref 21–32)
Creatinine: 0.87 mg/dL (ref 0.60–1.30)
EGFR (African American): >60
EGFR (Non-African Amer.): >60
Glucose: 200 mg/dL — ABNORMAL HIGH (ref 65–99)
Osmolality: 285 (ref 275–301)
Potassium: 3.9 mmol/L (ref 3.5–5.1)
Sodium: 141 mmol/L (ref 136–145)
Total Protein: 7.9 g/dL (ref 6.4–8.2)

## 2012-12-23 LAB — URINALYSIS, COMPLETE
Blood: NEGATIVE
Ketone: NEGATIVE
Nitrite: POSITIVE
Ph: 6 (ref 4.5–8.0)

## 2012-12-23 LAB — CBC
HGB: 10.5 g/dL — ABNORMAL LOW (ref 12.0–16.0)
MCH: 27.7 pg (ref 26.0–34.0)
MCHC: 32.5 g/dL (ref 32.0–36.0)
RDW: 15.9 % — ABNORMAL HIGH (ref 11.5–14.5)
WBC: 7.1 10*3/uL (ref 3.6–11.0)

## 2012-12-23 LAB — TROPONIN I: Troponin-I: <0.02 ng/mL

## 2012-12-28 ENCOUNTER — Telehealth: Payer: Self-pay | Admitting: Internal Medicine

## 2012-12-28 ENCOUNTER — Telehealth: Payer: Self-pay | Admitting: *Deleted

## 2012-12-28 NOTE — Telephone Encounter (Signed)
If pain severe, then she needs to be evaluated in the ED.

## 2012-12-28 NOTE — Telephone Encounter (Signed)
Pt notified to go to ED for severe pain, verbalized understanding.

## 2012-12-28 NOTE — Telephone Encounter (Signed)
Let's try to get these ER records for review.

## 2012-12-28 NOTE — Telephone Encounter (Signed)
Caller: Shalva/Patient; Phone: [phone]; Reason for Call: Please call patient concerning a kidney infection and requesting to be admitted to "receive Vancomycin" for treatment.  Patient declines triage and wants Dr.  Dan Humphreys to call her back.  She states that the pain is now severe enough that she need to be admitted.

## 2012-12-28 NOTE — Telephone Encounter (Signed)
Patient is at [facility] because of an UTI she is having, it is susceptible to the Cipro but she was just on that. They were going to put her on Macrobid but she is refusing taking the medication. She has an appointment for this week on Wed to see Dr. Dan Humphreys so they are going to fax over the results they have and let her decide how to handle this. Patient informed that all she want to do is be admitted to the hospital. They are discharging her because they see no reason to admit her, she is not febrile, breathing is ok and she is getting around just fine.

## 2012-12-28 NOTE — Telephone Encounter (Signed)
Called to request admission for Vancomycin for pyelonephritis. Evaluated at [facility] ED 12/25/12. Received call from ED staff 12/28/12 reporting pyelonephritis is still present per positive urine culture. Advised to call PCP because bacteria was resistant to all antibiotics. Patient is allergic to Sulfa, PCN and Amoxicillin.  Urine is "coffee colored," smells like ammonia and urgency and dysuria present with severe low back and bilateral flank pain. Temp 102.3 po 12/27/12.  Declined triage.  Please call back ASAP.

## 2012-12-30 ENCOUNTER — Encounter: Payer: Self-pay | Admitting: Internal Medicine

## 2012-12-30 ENCOUNTER — Ambulatory Visit (INDEPENDENT_AMBULATORY_CARE_PROVIDER_SITE_OTHER): Payer: 59 | Admitting: Internal Medicine

## 2012-12-30 VITALS — BP 102/70 | HR 100 | Temp 98.2°F | Wt 264.0 lb

## 2012-12-30 DIAGNOSIS — N390 Urinary tract infection, site not specified: Secondary | ICD-10-CM

## 2012-12-30 DIAGNOSIS — N824 Other female intestinal-genital tract fistulae: Secondary | ICD-10-CM | POA: Insufficient documentation

## 2012-12-30 DIAGNOSIS — N823 Fistula of vagina to large intestine: Secondary | ICD-10-CM | POA: Insufficient documentation

## 2012-12-30 DIAGNOSIS — G8929 Other chronic pain: Secondary | ICD-10-CM

## 2012-12-30 DIAGNOSIS — N949 Unspecified condition associated with female genital organs and menstrual cycle: Secondary | ICD-10-CM

## 2012-12-30 LAB — POCT URINALYSIS DIPSTICK
Nitrite, UA: NEGATIVE
Protein, UA: NEGATIVE
pH, UA: 6

## 2012-12-30 NOTE — Assessment & Plan Note (Signed)
Patient reports that she recently completed Macrobid for urinary tract infection. However, this differs from reports from the ER physician he reported that she refused to take prescription for the medication. Urinalysis today shows trace leukocytes. Will send urine for culture. 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 Recommended referral to tertiary care center urogynecology for repair of this fistula. She declines. She requests referral to local gynecologist. This has been set up at her last visit and she missed the appointment. Will reschedule this visit for her.

## 2012-12-30 NOTE — Assessment & Plan Note (Signed)
Patient reports chronic pelvic pain and requests narcotic pain medication. She has a history of narcotic abuse. Encouraged her to follow through with GYN for further exploration of cause of chronic pelvic pain. Will also request recent CT abd/pel from [facility].

## 2012-12-30 NOTE — Progress Notes (Signed)
Subjective:    Patient ID: [name], female    DOB: July 22, 1955, 57 y.o.   MRN: [id number]  HPI 57 year old female with history of chronic abdominal pain, chronic liver disease, seizure disorder, hypertension, narcotic and alcohol abuse presents for followup. She reports she was recently seen in the emergency room and diagnosed with urinary tract infection. She was discharged home with prescription for Macrobid. She reports that symptoms are unchanged. We discussed that we were notified by the ER that she had refused this prescription and requested IV vancomycin. She denies this. She requests narcotic pain medication today to help control lower abdominal pain. This is been chronic for her. She notes she has a history of rectovaginal fistula. She has chronic leakage of stool through her vagina. This is been repaired in the past. At her last visit, we had discussed this and made an appointment for her with a local OB/GYN. She missed this appointment. She is not interested in referral to a tertiary care center such as [facility] or [facility] for urogynecology evaluation. She reports chronic aching pain in her lower abdomen which radiates to her back. She reports that she recently underwent CT of the abdomen in the ER and this was normal. She reports eructation in her vaginal walls from chronic leakage of stool.  Outpatient Prescriptions Prior to Visit  Medication Sig Dispense Refill  . albuterol (PROVENTIL) (5 MG/ML) 0.5% nebulizer solution Take 2.5 mg by nebulization 4 (four) times daily.      Marland Kitchen ALPRAZolam (XANAX) 1 MG tablet Take 1 tablet (1 mg total) by mouth daily.  30 tablet  0  . amitriptyline (ELAVIL) 50 MG tablet Take 1 tablet (50 mg total) by mouth at bedtime.  30 tablet  3  . amLODipine (NORVASC) 10 MG tablet Take 1 tablet (10 mg total) by mouth daily.  30 tablet  4  . amylase-lipase-protease (LIPRAM-CR10) 33.08-18-35.5 MU per capsule Take by mouth 3 (three) times daily with meals.      Marland Kitchen CREON 12000 UNITS  CPEP       . escitalopram (LEXAPRO) 10 MG tablet Take 1 tablet (10 mg total) by mouth daily.  30 tablet  4  . Fluticasone-Salmeterol (ADVAIR) 250-50 MCG/DOSE AEPB Inhale 1 puff into the lungs as needed.      . lactulose (CHRONULAC) 10 GM/15ML solution       . levETIRAcetam (KEPPRA) 500 MG tablet Take 1 tablet (500 mg total) by mouth 2 (two) times daily.  60 tablet  3  . metoprolol succinate (TOPROL-XL) 25 MG 24 hr tablet Take 1 tablet (25 mg total) by mouth daily.  30 tablet  4  . omeprazole (PRILOSEC) 40 MG capsule Take 1 capsule (40 mg total) by mouth 2 (two) times daily.  60 capsule  1  . ondansetron (ZOFRAN-ODT) 8 MG disintegrating tablet Take 1 tablet (8 mg total) by mouth every 8 (eight) hours as needed for nausea.  20 tablet  0  . oxyCODONE (OXY IR/ROXICODONE) 5 MG immediate release tablet Take 1 tablet (5 mg total) by mouth every 6 (six) hours.  30 tablet  0  . phenytoin (DILANTIN) 125 MG/5ML suspension Take 12 mLs (300 mg total) by mouth 2 (two) times daily.  237 mL  3  . QUEtiapine (SEROQUEL) 100 MG tablet Take 3.5 tablets (350 mg total) by mouth daily.  105 tablet  4  . Respiratory Therapy Supplies (NEBULIZER) DEVI Use as directed  1 each  0  . spironolactone (ALDACTONE) 50 MG tablet Take  1 tablet (50 mg total) by mouth 3 (three) times daily.  90 tablet  3  . tiotropium (SPIRIVA) 18 MCG inhalation capsule Place 18 mcg into inhaler and inhale as needed.      . zolpidem (AMBIEN) 10 MG tablet Take 1 tablet (10 mg total) by mouth at bedtime.  30 tablet  3  . cephALEXin (KEFLEX) 500 MG capsule Take 1 capsule (500 mg total) by mouth 2 (two) times daily.  20 capsule  0  . ciprofloxacin (CIPRO) 500 MG tablet Take 1 tablet (500 mg total) by mouth 2 (two) times daily.  14 tablet  0   No facility-administered medications prior to visit.    Review of Systems  Constitutional: Negative for fever, chills, appetite change, fatigue and unexpected weight change.  HENT: Negative for ear pain,  congestion, sore throat, trouble swallowing, neck pain, voice change and sinus pressure.   Eyes: Negative for visual disturbance.  Respiratory: Positive for cough. Negative for shortness of breath, wheezing and stridor.   Cardiovascular: Negative for chest pain, palpitations and leg swelling.  Gastrointestinal: Positive for abdominal pain and diarrhea (loose stool coming through vagina). Negative for nausea, vomiting, constipation, blood in stool, abdominal distention and anal bleeding.  Genitourinary: Negative for dysuria and flank pain.  Musculoskeletal: Negative for myalgias, arthralgias and gait problem.  Skin: Negative for color change and rash.  Neurological: Negative for dizziness and headaches.  Hematological: Negative for adenopathy. Does not bruise/bleed easily.  Psychiatric/Behavioral: Negative for suicidal ideas, sleep disturbance and dysphoric mood. The patient is not nervous/anxious.        Objective:   Physical Exam  Constitutional: She is oriented to person, place, and time. She appears well-developed and well-nourished. No distress.  HENT:  Head: Normocephalic and atraumatic.  Right Ear: External ear normal.  Left Ear: External ear normal.  Nose: Nose normal.  Mouth/Throat: Oropharynx is clear and moist. No oropharyngeal exudate.  Eyes: Conjunctivae are normal. Pupils are equal, round, and reactive to light. Right eye exhibits no discharge. Left eye exhibits no discharge. No scleral icterus.  Neck: Normal range of motion. Neck supple. No tracheal deviation present. No thyromegaly present.  Cardiovascular: Normal rate, regular rhythm, normal heart sounds and intact distal pulses.  Exam reveals no gallop and no friction rub.   No murmur heard. Pulmonary/Chest: Effort normal and breath sounds normal. No accessory muscle usage. Not tachypneic. No respiratory distress. She has no decreased breath sounds. She has no wheezes. She has no rhonchi. She has no rales. She exhibits no  tenderness.  Abdominal: Soft. Bowel sounds are normal. She exhibits no distension and no mass. There is tenderness (diffuse). There is no rebound and no guarding.  Musculoskeletal: Normal range of motion. She exhibits no edema and no tenderness.  Lymphadenopathy:    She has no cervical adenopathy.  Neurological: She is alert and oriented to person, place, and time. No cranial nerve deficit. She exhibits normal muscle tone. Coordination normal.  Skin: Skin is warm and dry. No rash noted. She is not diaphoretic. No erythema. No pallor.  Psychiatric: Her speech is normal. Judgment and thought content normal. Her mood appears anxious. She is agitated.          Assessment & Plan:

## 2012-12-30 NOTE — Assessment & Plan Note (Signed)
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 Patient also has chronic lower pelvic pain. Will request report on recent CT of the abdomen and pelvis. Encouraged her to consider referral to a tertiary care Center for potential repair of rectovaginal fistula. She declines. She requests local referral to GYN. We had set this up at her last appointment but she missed this visit. Will reschedule for her. Discussed the potential risk of chronic rectovaginal fistula including recurrent urinary tract infections.

## 2012-12-31 ENCOUNTER — Telehealth: Payer: Self-pay | Admitting: *Deleted

## 2012-12-31 NOTE — Telephone Encounter (Signed)
Left a message on voicemail checking to see had the prescription for her nebulizer been faxed to [facility]

## 2012-12-31 NOTE — Telephone Encounter (Signed)
I had sent a message to the front about this, but they may not have seen it if she didn't stop to check out. Can you please fax over an Rx for nebulizer machine to [facility]? Thanks

## 2013-01-01 ENCOUNTER — Encounter: Payer: Self-pay | Admitting: Internal Medicine

## 2013-01-01 ENCOUNTER — Encounter: Payer: Self-pay | Admitting: Family Medicine

## 2013-01-01 LAB — URINE CULTURE

## 2013-01-02 ENCOUNTER — Emergency Department: Payer: Self-pay | Admitting: Emergency Medicine

## 2013-01-03 LAB — URINALYSIS, COMPLETE
Ph: 6 (ref 4.5–8.0)
Protein: NEGATIVE
WBC UR: 5 /HPF (ref 0–5)

## 2013-01-03 LAB — CBC
MCH: 27.2 pg (ref 26.0–34.0)
MCV: 84 fL (ref 80–100)
RBC: 3.91 10*6/uL (ref 3.80–5.20)
WBC: 8.1 10*3/uL (ref 3.6–11.0)

## 2013-01-03 LAB — COMPREHENSIVE METABOLIC PANEL
Alkaline Phosphatase: 194 U/L — ABNORMAL HIGH (ref 50–136)
Anion Gap: 7 (ref 7–16)
BUN: 8 mg/dL (ref 7–18)
Bilirubin,Total: 0.6 mg/dL (ref 0.2–1.0)
Calcium, Total: 8.7 mg/dL (ref 8.5–10.1)
Chloride: 109 mmol/L — ABNORMAL HIGH (ref 98–107)
Co2: 25 mmol/L (ref 21–32)
Creatinine: 0.89 mg/dL (ref 0.60–1.30)
EGFR (African American): >60
Potassium: 3.2 mmol/L — ABNORMAL LOW (ref 3.5–5.1)
Sodium: 141 mmol/L (ref 136–145)

## 2013-01-03 LAB — LIPASE, BLOOD: Lipase: 185 U/L (ref 73–393)

## 2013-01-04 NOTE — Telephone Encounter (Signed)
Faxed today to [facility] in [place]

## 2013-01-05 ENCOUNTER — Encounter: Payer: Self-pay | Admitting: *Deleted

## 2013-01-09 IMAGING — CT CT ABD-PELV W/ CM
1 of 2 series · 15 of 32 positions shown, 19 images · IV contrast (isovue)
Comparison: none

REASON FOR EXAM: (1) abd pain; (2) pel pain
COMMENTS:

PROCEDURE:     CT  - CT ABDOMEN / PELVIS  W  - January 30, 2011  [DATE]
RESULT:     Comparison:  05/15/2010
TECHNIQUE: Multiple axial images of the abdomen and pelvis were performed
from the lung bases to the pubic symphysis, with p.o. contrast and with 100
mL of Isovue 370 intravenous contrast.

[Series 2: 3mm soft tissue · axial · 0.92mm/px · z∈[-422,+10]mm · 15 of 156 slices shown, 19 images]
[im 6/156  soft-tissue]
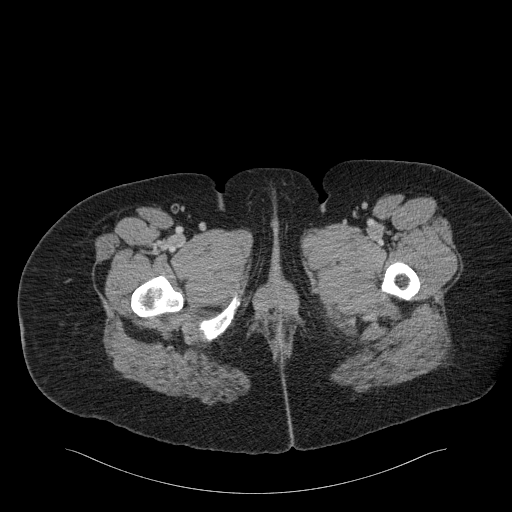
[im 6/156  bone]
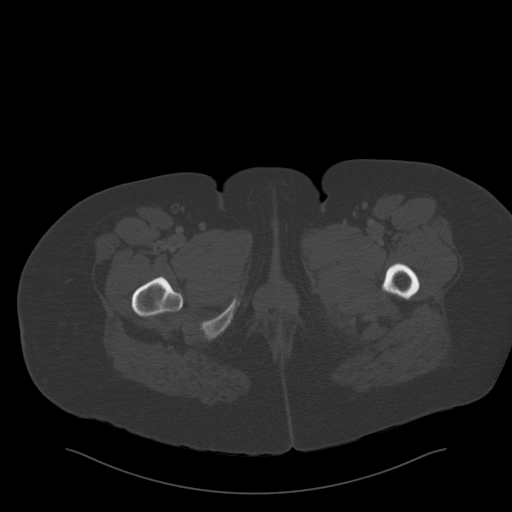
[im 18/156  soft-tissue]
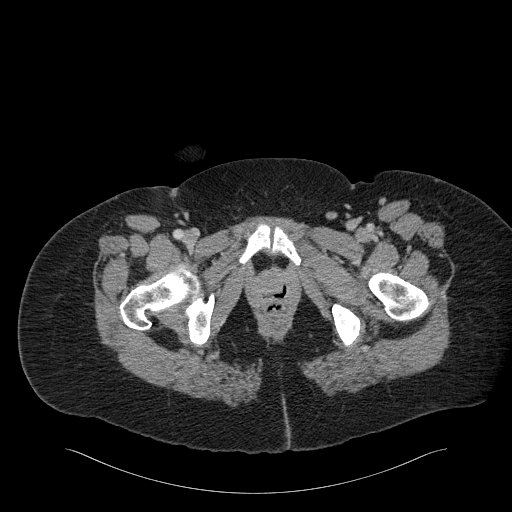
[im 30/156  soft-tissue]
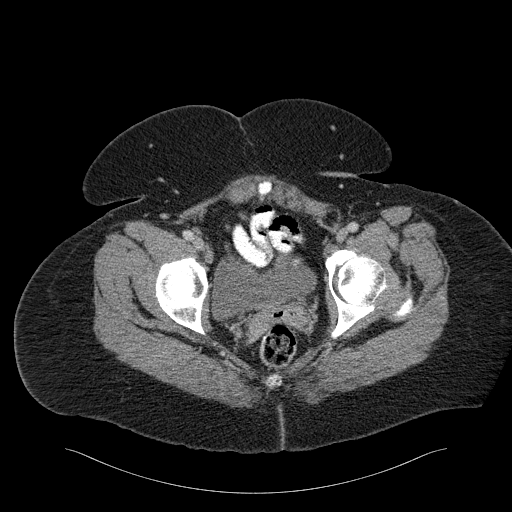
[im 42/156  soft-tissue]
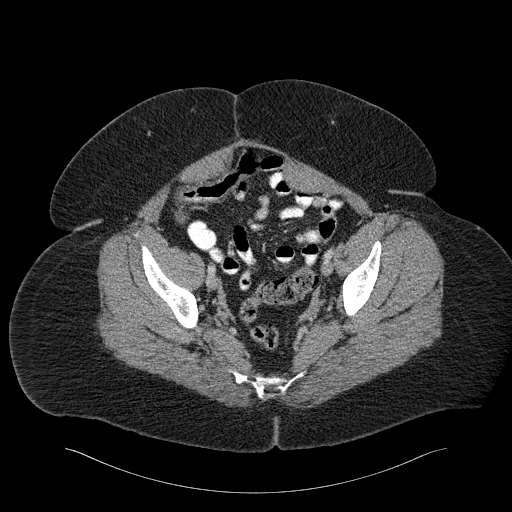
[im 54/156  soft-tissue]
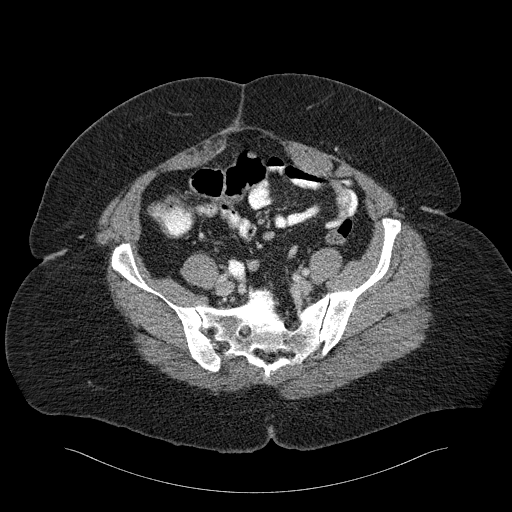
[im 66/156  soft-tissue]
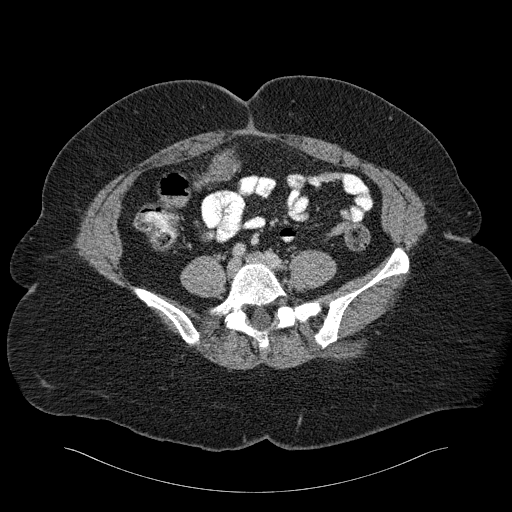
[im 78/156  soft-tissue]
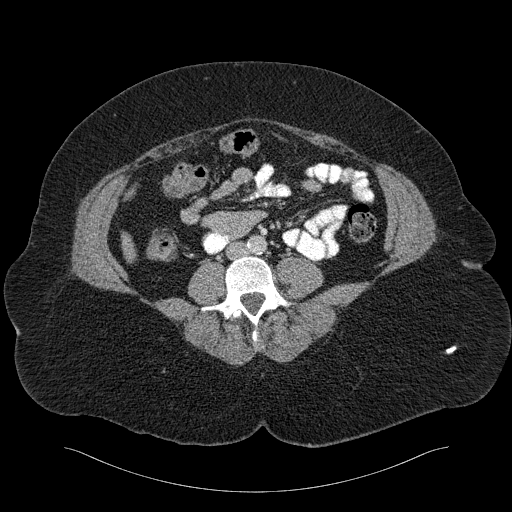
[im 90/156  soft-tissue]
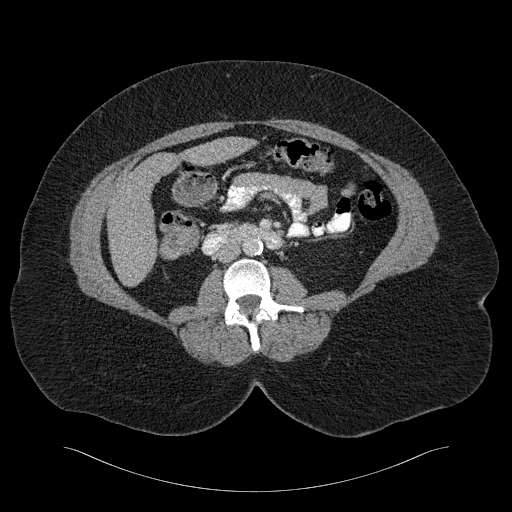
[im 102/156  soft-tissue]
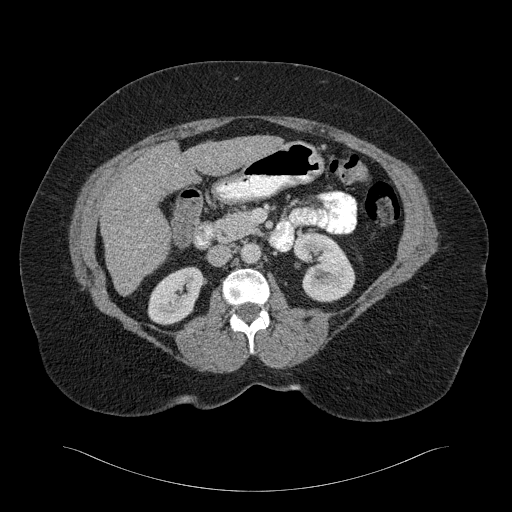
[im 102/156  bone]
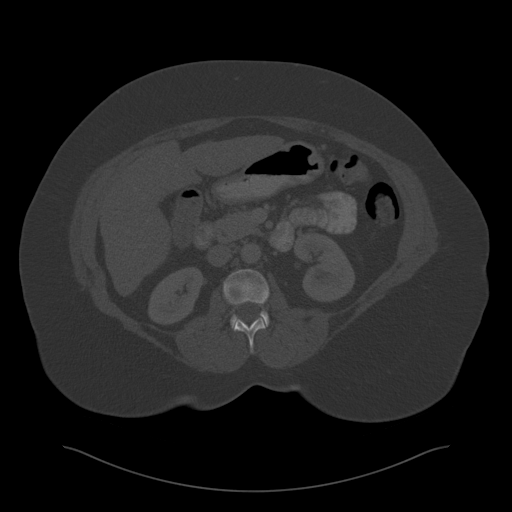
[im 114/156  soft-tissue]
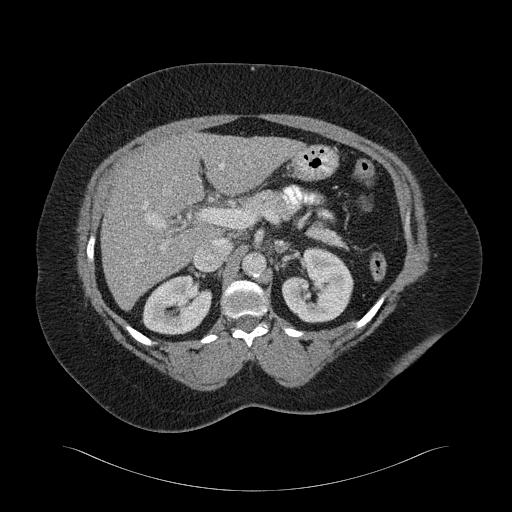
[im 126/156  soft-tissue]
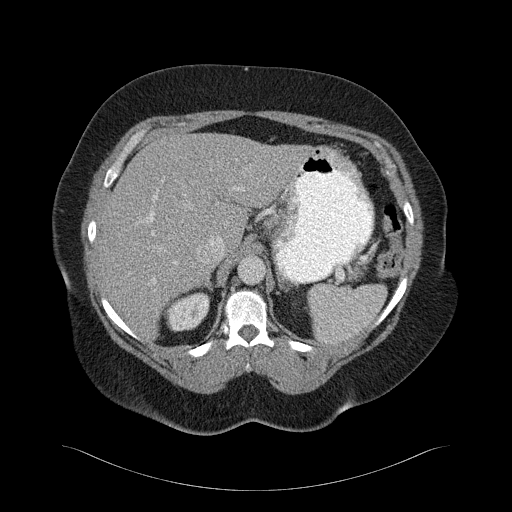
[im 132/156  lung]
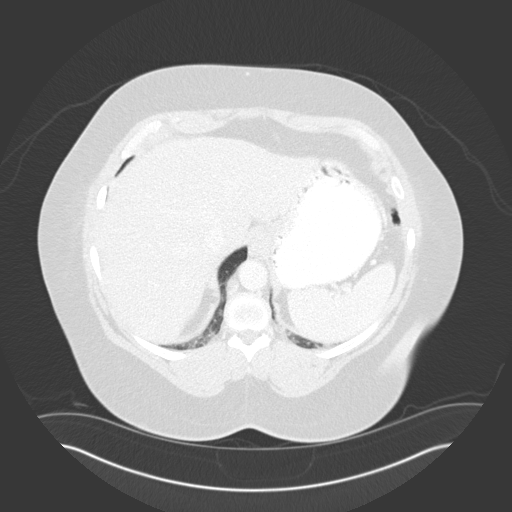
[im 138/156  soft-tissue]
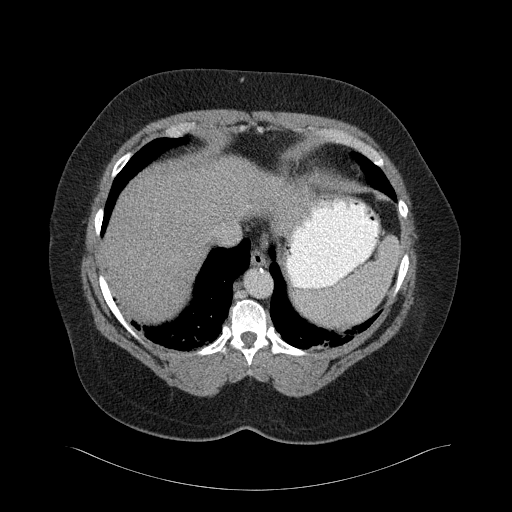
[im 138/156  lung]
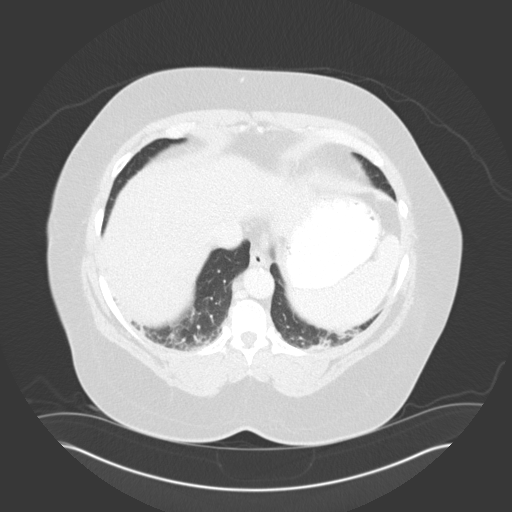
[im 144/156  lung]
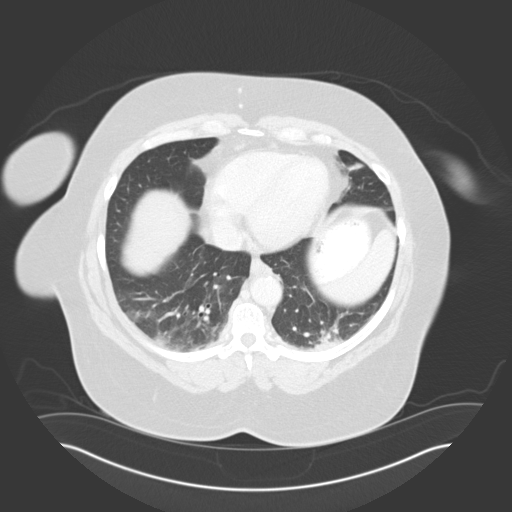
[im 150/156  soft-tissue]
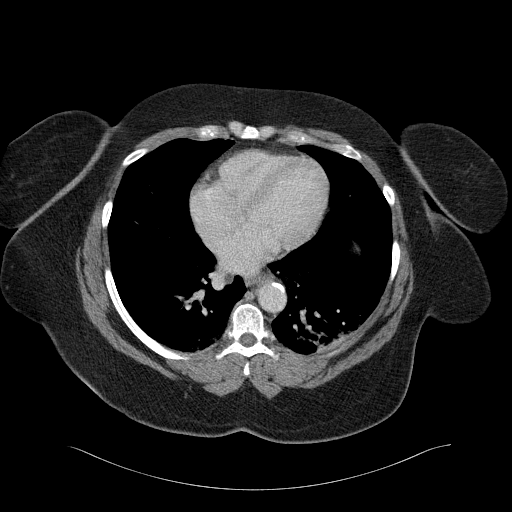
[im 150/156  lung]
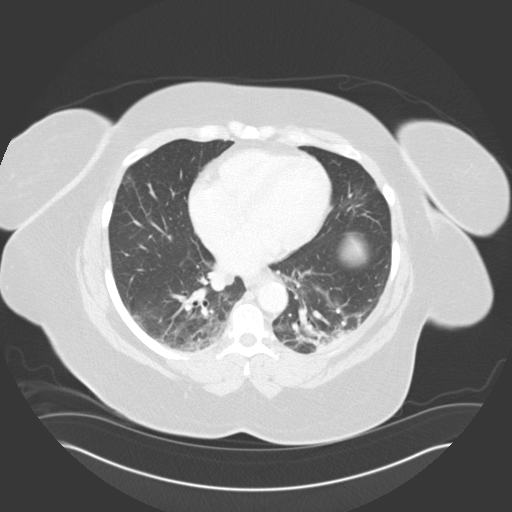

[15 of 32 positions shown; findings below may reference images not displayed]

FINDINGS: Bibasilar linear opacities are similar to prior and likely represent
atelectasis or scarring versus changes of fibrosis.

The liver, spleen, and pancreas are unremarkable. Surgical clips are seen
from prior cholecystectomy. Small, 1.5 cm, nodule in the left adrenal gland
is indeterminate on this study. However, it is unchanged from at least the
chest CT 02/25/2008, and therefore likely benign.

The kidneys enhance normally. No hydronephrosis. The patient is status post
hysterectomy. The appendix is not definitely visualized. However, there are
inflammatory changes in the right lower quadrant. The cecum is somewhat
patulous.

No aggressive lytic or sclerotic osseous lesions identified.
IMPRESSION: No acute findings in the abdomen or pelvis.

## 2013-01-11 ENCOUNTER — Telehealth: Payer: Self-pay | Admitting: Internal Medicine

## 2013-01-11 NOTE — Telephone Encounter (Signed)
Pt states Dr. Dan Humphreys left her a msg that she needed to call and update her phone #:  [phone] has been added.  Keflex - only has about 3 left. Phenazopyridine - has taken all of that.  Just wants her to know she has finished these but is still having a lot of back pain.   Pt also calling to get test results.

## 2013-01-12 NOTE — Telephone Encounter (Signed)
Please read below, however test results were mailed to patient in the letter she received.

## 2013-01-12 NOTE — Telephone Encounter (Signed)
OK. Urinary tract infection should have been treated with macrobid. If symptoms persistent, needs to be seen.

## 2013-01-13 NOTE — Telephone Encounter (Signed)
Left message to call back  

## 2013-01-14 NOTE — Telephone Encounter (Signed)
Per patient she is still having UTI symptoms, appointment scheduled with Raquel tomorrow

## 2013-01-15 ENCOUNTER — Ambulatory Visit: Payer: 59 | Admitting: Adult Health

## 2013-01-20 ENCOUNTER — Telehealth: Payer: Self-pay | Admitting: Internal Medicine

## 2013-01-20 NOTE — Telephone Encounter (Signed)
Pt states she has been calling for a few days to get results of testing.  Pt states she has now received a letter in the mail for an appointment with the cancer center.  Pt is concerned because she doesn't even know what is going on.  Pt asking to please contact her today.

## 2013-01-20 NOTE — Telephone Encounter (Signed)
Will call patient back today, patient was scheduled for an appointment with Raquel last that she no showed for. She was given her results on the same day she scheduled appointment to see Raquel.

## 2013-01-20 NOTE — Telephone Encounter (Signed)
Called and spoke with patient, she state she would like to know if we have the hospital notes and information. Asked had she been in the hospital again since her last visit, she has not. Informed her she has seen Dr. Dan Humphreys since her last hospital visit so we do not have any other results for her. And she was supposed to have came in to see Raquel but cancelled. Then she stated she received a letter from [facility] saying she has an appointment scheduled for tomorrow, I did not see a referral in her chart about this. Told her to call [facility], the number on the letter and maybe they could tell her why she has that appointment and I would ask Dr. Dan Humphreys about this.

## 2013-01-21 NOTE — Telephone Encounter (Signed)
I don't know about the appointment with [facility].

## 2013-01-25 ENCOUNTER — Emergency Department: Payer: Self-pay | Admitting: Emergency Medicine

## 2013-01-25 LAB — COMPREHENSIVE METABOLIC PANEL
Albumin: 3.1 g/dL — ABNORMAL LOW (ref 3.4–5.0)
Alkaline Phosphatase: 190 U/L — ABNORMAL HIGH (ref 50–136)
Anion Gap: 6 — ABNORMAL LOW (ref 7–16)
BUN: 7 mg/dL (ref 7–18)
Calcium, Total: 9.5 mg/dL (ref 8.5–10.1)
Chloride: 105 mmol/L (ref 98–107)
Co2: 26 mmol/L (ref 21–32)
EGFR (Non-African Amer.): >60
Glucose: 93 mg/dL (ref 65–99)
Osmolality: 271 (ref 275–301)
SGOT(AST): 70 U/L — ABNORMAL HIGH (ref 15–37)
Sodium: 137 mmol/L (ref 136–145)

## 2013-01-25 LAB — CBC
HCT: 34.3 % — ABNORMAL LOW (ref 35.0–47.0)
MCH: 26.8 pg (ref 26.0–34.0)
WBC: 8.8 10*3/uL (ref 3.6–11.0)

## 2013-01-25 LAB — TROPONIN I: Troponin-I: <0.02 ng/mL

## 2013-01-25 LAB — PRO B NATRIURETIC PEPTIDE: B-Type Natriuretic Peptide: 10 pg/mL (ref 0–125)

## 2013-01-26 ENCOUNTER — Other Ambulatory Visit: Payer: Self-pay | Admitting: Internal Medicine

## 2013-01-26 ENCOUNTER — Encounter: Payer: Self-pay | Admitting: Internal Medicine

## 2013-01-27 ENCOUNTER — Telehealth: Payer: Self-pay | Admitting: Internal Medicine

## 2013-01-27 NOTE — Telephone Encounter (Addendum)
Patient dismissed from [facility] by Ronna Polio MD , effective January 27 2016. Dismissal letter sent out by certified / registered mail. DAJ  Received signed domestic return receipt verifying delivery of certified letter on February 01 2013. Article number [phone]  DAJ

## 2013-02-10 ENCOUNTER — Ambulatory Visit: Payer: Self-pay | Admitting: Gastroenterology

## 2013-02-14 ENCOUNTER — Emergency Department: Payer: Self-pay | Admitting: Emergency Medicine

## 2013-02-14 LAB — COMPREHENSIVE METABOLIC PANEL
Alkaline Phosphatase: 177 U/L — ABNORMAL HIGH (ref 50–136)
Anion Gap: 5 — ABNORMAL LOW (ref 7–16)
BUN: 9 mg/dL (ref 7–18)
Bilirubin,Total: 0.6 mg/dL (ref 0.2–1.0)
Co2: 27 mmol/L (ref 21–32)
EGFR (African American): >60
EGFR (Non-African Amer.): 60 — ABNORMAL LOW
Potassium: 3.6 mmol/L (ref 3.5–5.1)
SGOT(AST): 61 U/L — ABNORMAL HIGH (ref 15–37)
Sodium: 137 mmol/L (ref 136–145)
Total Protein: 8.1 g/dL (ref 6.4–8.2)

## 2013-02-14 LAB — CBC
HCT: 32.1 % — ABNORMAL LOW (ref 35.0–47.0)
MCH: 27.4 pg (ref 26.0–34.0)
MCHC: 32.7 g/dL (ref 32.0–36.0)
MCV: 84 fL (ref 80–100)
WBC: 7.6 10*3/uL (ref 3.6–11.0)

## 2013-02-15 LAB — PATHOLOGY REPORT

## 2013-02-18 DIAGNOSIS — K7290 Hepatic failure, unspecified without coma: Secondary | ICD-10-CM | POA: Insufficient documentation

## 2013-03-10 ENCOUNTER — Emergency Department: Payer: Self-pay | Admitting: Emergency Medicine

## 2013-03-10 LAB — COMPREHENSIVE METABOLIC PANEL
Alkaline Phosphatase: 160 U/L — ABNORMAL HIGH (ref 50–136)
Anion Gap: 7 (ref 7–16)
BUN: 6 mg/dL — ABNORMAL LOW (ref 7–18)
Bilirubin,Total: 0.7 mg/dL (ref 0.2–1.0)
Co2: 26 mmol/L (ref 21–32)
EGFR (African American): >60
Glucose: 140 mg/dL — ABNORMAL HIGH (ref 65–99)
Potassium: 3.4 mmol/L — ABNORMAL LOW (ref 3.5–5.1)
SGOT(AST): 70 U/L — ABNORMAL HIGH (ref 15–37)
SGPT (ALT): 46 U/L (ref 12–78)
Total Protein: 8 g/dL (ref 6.4–8.2)

## 2013-03-10 LAB — CBC
HCT: 33.2 % — ABNORMAL LOW (ref 35.0–47.0)
MCH: 27.1 pg (ref 26.0–34.0)
MCV: 84 fL (ref 80–100)
Platelet: 126 10*3/uL — ABNORMAL LOW (ref 150–440)
RBC: 3.95 10*6/uL (ref 3.80–5.20)

## 2013-03-10 LAB — CK TOTAL AND CKMB (NOT AT ARMC): CK-MB: 2.9 ng/mL (ref 0.5–3.6)

## 2013-03-10 LAB — PRO B NATRIURETIC PEPTIDE: B-Type Natriuretic Peptide: 59 pg/mL (ref 0–125)

## 2013-03-10 LAB — TROPONIN I: Troponin-I: <0.02 ng/mL

## 2013-03-23 ENCOUNTER — Emergency Department: Payer: Self-pay | Admitting: Emergency Medicine

## 2013-03-23 LAB — URINALYSIS, COMPLETE
Glucose,UR: NEGATIVE mg/dL (ref 0–75)
Ketone: NEGATIVE
Nitrite: NEGATIVE
Ph: 6 (ref 4.5–8.0)
Protein: NEGATIVE
RBC,UR: 9 /HPF (ref 0–5)
WBC UR: 4 /HPF (ref 0–5)

## 2013-03-23 LAB — COMPREHENSIVE METABOLIC PANEL
Albumin: 3.2 g/dL — ABNORMAL LOW (ref 3.4–5.0)
Anion Gap: 8 (ref 7–16)
BUN: 5 mg/dL — ABNORMAL LOW (ref 7–18)
Co2: 26 mmol/L (ref 21–32)
Creatinine: 0.78 mg/dL (ref 0.60–1.30)
Potassium: 3.2 mmol/L — ABNORMAL LOW (ref 3.5–5.1)
Total Protein: 8.5 g/dL — ABNORMAL HIGH (ref 6.4–8.2)

## 2013-03-23 LAB — CBC
MCH: 26.9 pg (ref 26.0–34.0)
MCHC: 31.9 g/dL — ABNORMAL LOW (ref 32.0–36.0)
RDW: 17.7 % — ABNORMAL HIGH (ref 11.5–14.5)
WBC: 9.1 10*3/uL (ref 3.6–11.0)

## 2013-03-27 ENCOUNTER — Other Ambulatory Visit: Payer: Self-pay | Admitting: Internal Medicine

## 2013-03-28 IMAGING — MR MR CHOLANGIOGRAM
4 of 8 series · 26 of 48 positions shown · non-contrast
Comparison: none

REASON FOR EXAM: abdominal pain
COMMENTS:

PROCEDURE:     MR  - MR CHOLANGIOGRAM  - April 18, 2011  [DATE]
RESULT:
TECHNIQUE: Axial and coronal source imaging was obtained. Included are 2D
maximum intensity projections and 3D reconstructions were performed of the
extrahepatic biliary system.
The study is markedly degraded by motion artifact.

[Series 4: bSSFP · axial · 10.0mm · 0.80mm/px · z∈[+0,+241]mm · 8 of 41 slices shown]
[im 1/41]
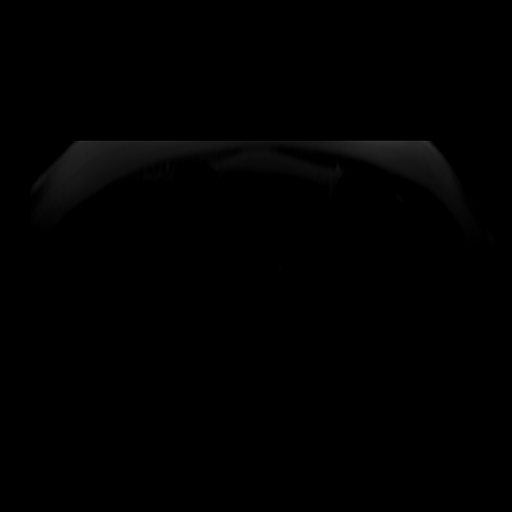
[im 5/41]
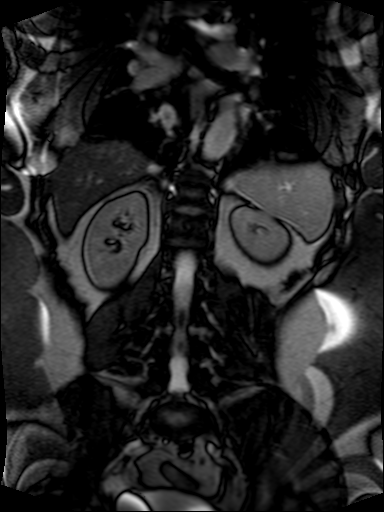
[im 14/41]
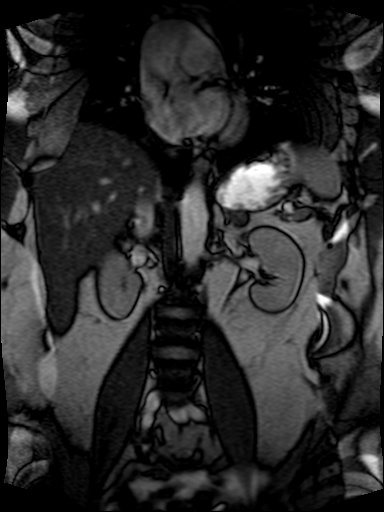
[im 18/41]
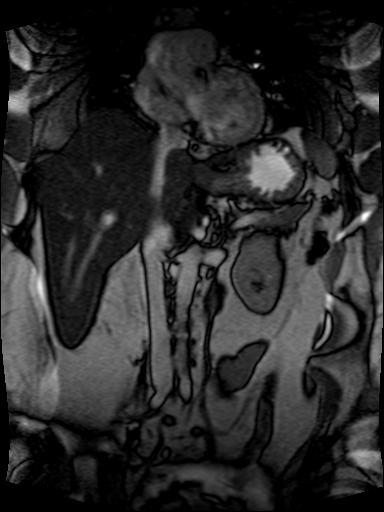
[im 23/41]
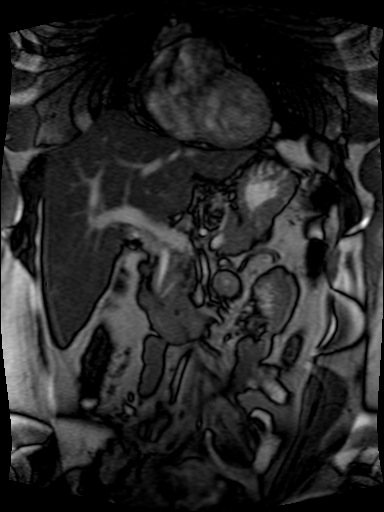
[im 27/41]
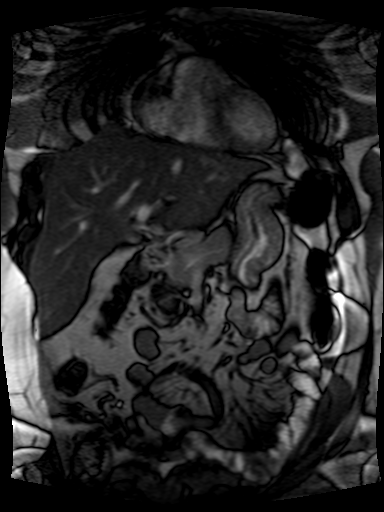
[im 36/41]
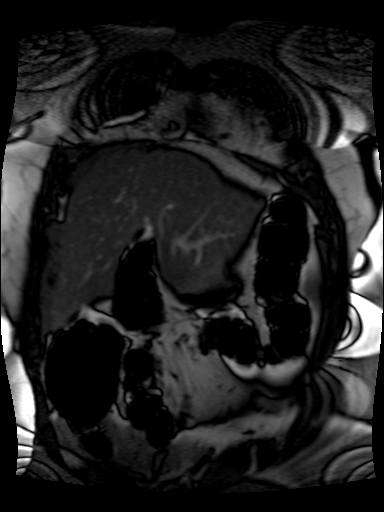
[im 41/41]
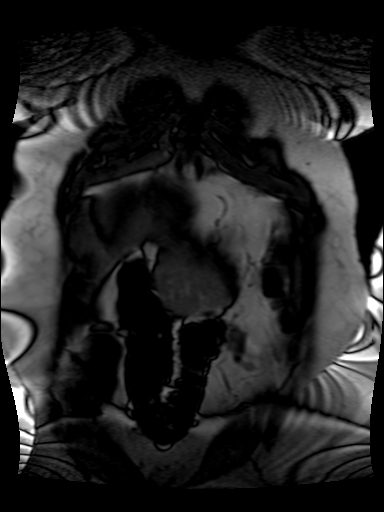

[Series 6: t2_trufi_tra · axial · 4.0mm · 0.82mm/px · z∈[-66,+257]mm · 8 of 41 slices shown]
[im 1/41]
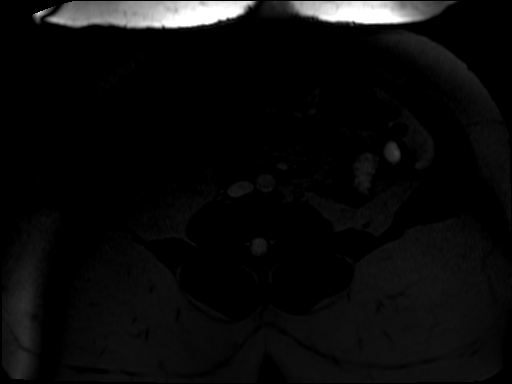
[im 5/41]
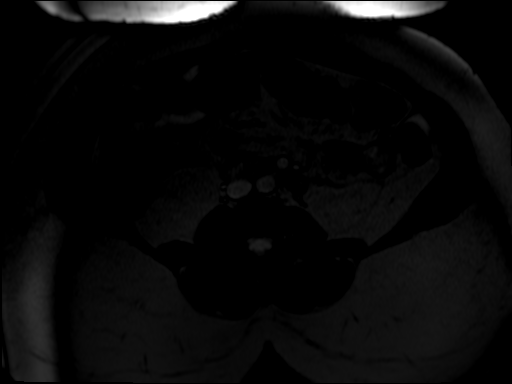
[im 14/41]
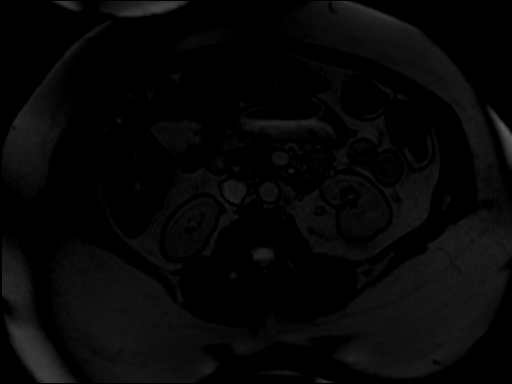
[im 18/41]
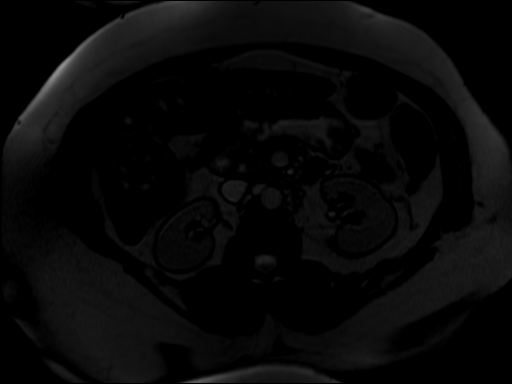
[im 23/41]
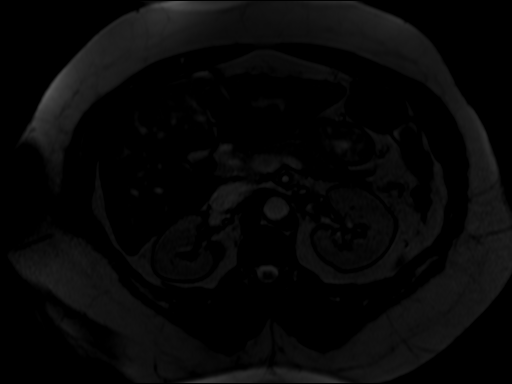
[im 27/41]
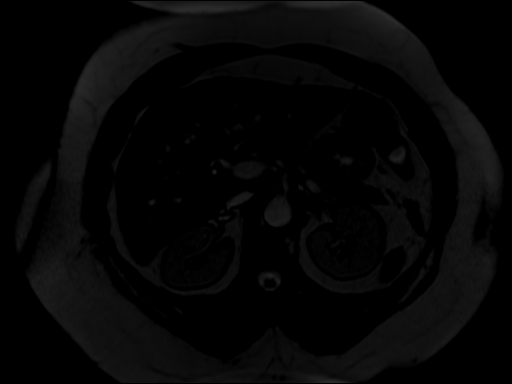
[im 36/41]
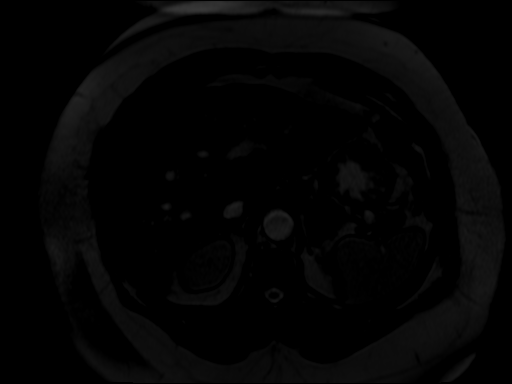
[im 41/41]
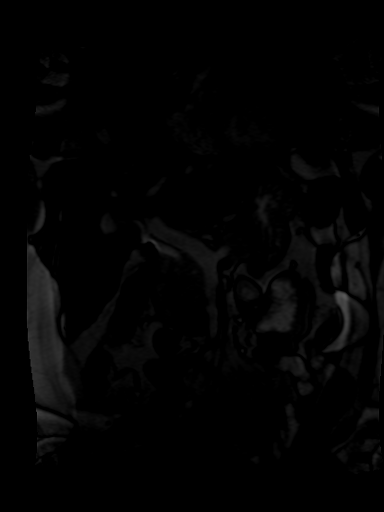

[Series 8: t1_tfl_ax triggered · axial · 6.0mm · 0.82mm/px · z∈[-62,+257]mm · 6 of 21 slices shown]
[im 1/21]
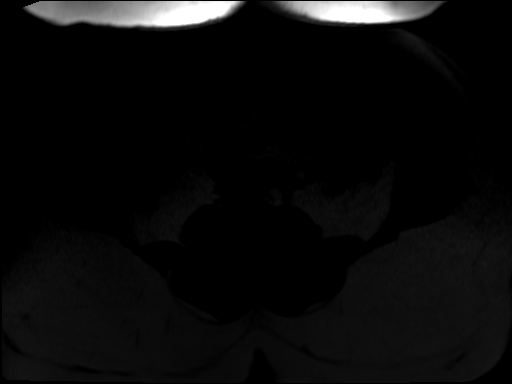
[im 5/21]
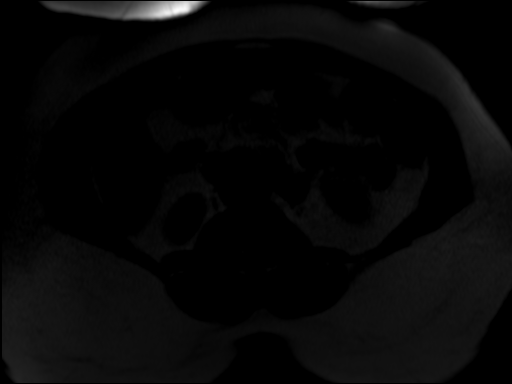
[im 9/21]
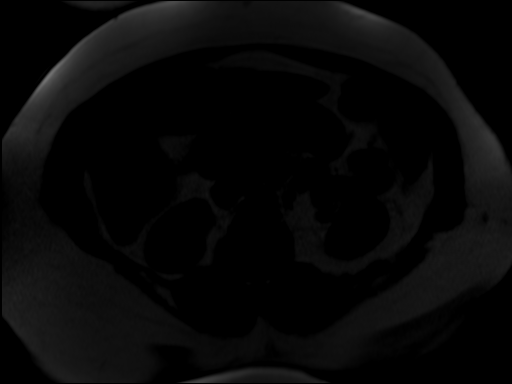
[im 13/21]
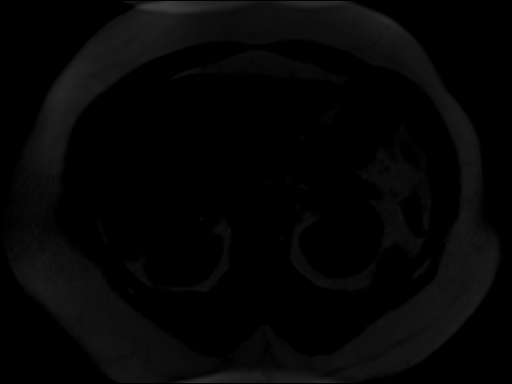
[im 17/21]
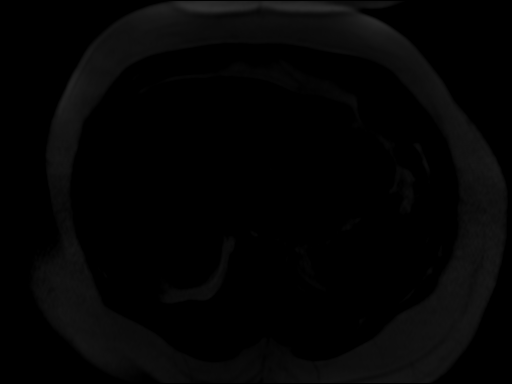
[im 21/21]
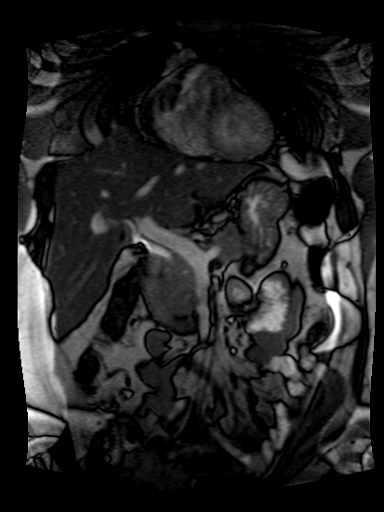

[Series 10: t2_tse_tra_trig_384 · axial · 6.0mm · 1.09mm/px · z∈[-56,+257]mm · 4 of 21 slices shown]
[im 1/21]
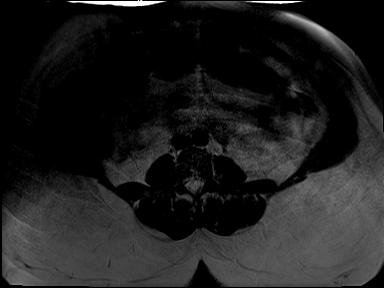
[im 5/21]
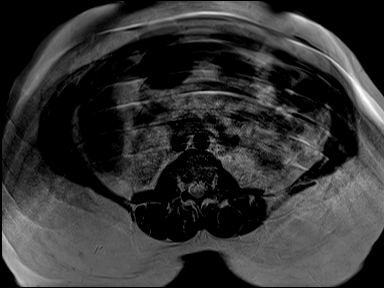
[im 13/21]
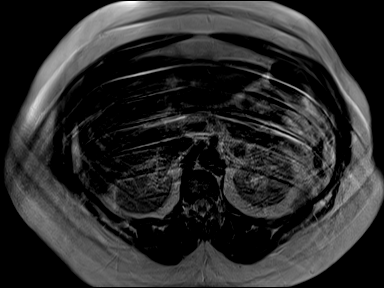
[im 21/21]
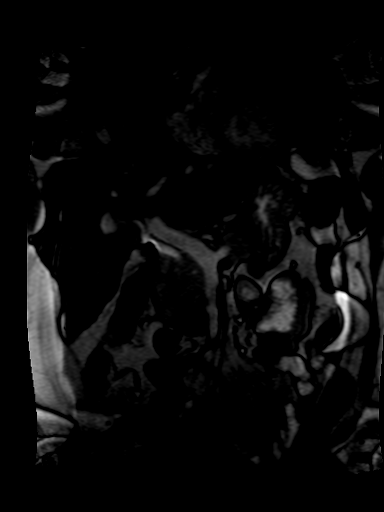

[26 of 48 positions shown; findings below may reference images not displayed]

FINDINGS: The common bile duct demonstrates no evidence of filling defects
or ductal dilatation. The common bile duct measures 6.1 mm in diameter.
Limited evaluation of the abdomen demonstrates no gross abnormalities. Note,
there is poor visualization of the intrahepatic biliary radicals.

There is primary visualization of extrahepatic biliary system. The
intrahepatic biliary radicals are not clearly appreciated.

Within the proximal common bile duct/distal hepatic duct, a filling defect
is appreciated. This causes partial mass effect upon the duct with findings
suggesting extrinsic compression. An eccentric calculus within this area
cannot be excluded. Note, the gallbladder is not clearly identified and this
area may represent the sequela of a prior cholecystectomy clip. Clinical
correlation is recommended. There is no reported history of cholecystectomy
though, as stated above, the gallbladder is not clearly identified. The
common bile duct otherwise demonstrates no evidence of focal out-pouchings,
fusiform dilatation or further evidence to suggest filling defects. The
abdomen is otherwise grossly unremarkable.
IMPRESSION: Filling defect versus extrinsic artifact along the proximal
common bile duct. This study is degraded as described above and is otherwise
unremarkable.

## 2013-03-29 ENCOUNTER — Emergency Department: Payer: Self-pay | Admitting: Emergency Medicine

## 2013-03-29 LAB — COMPREHENSIVE METABOLIC PANEL
Albumin: 3.3 g/dL — ABNORMAL LOW (ref 3.4–5.0)
Alkaline Phosphatase: 178 U/L — ABNORMAL HIGH (ref 50–136)
Anion Gap: 6 — ABNORMAL LOW (ref 7–16)
BUN: 6 mg/dL — ABNORMAL LOW (ref 7–18)
Bilirubin,Total: 0.6 mg/dL (ref 0.2–1.0)
Calcium, Total: 9.3 mg/dL (ref 8.5–10.1)
Chloride: 107 mmol/L (ref 98–107)
Co2: 27 mmol/L (ref 21–32)
Creatinine: 1.02 mg/dL (ref 0.60–1.30)
EGFR (Non-African Amer.): >60
Potassium: 3.4 mmol/L — ABNORMAL LOW (ref 3.5–5.1)
SGOT(AST): 75 U/L — ABNORMAL HIGH (ref 15–37)
SGPT (ALT): 44 U/L (ref 12–78)
Sodium: 140 mmol/L (ref 136–145)
Total Protein: 8.8 g/dL — ABNORMAL HIGH (ref 6.4–8.2)

## 2013-03-29 LAB — URINALYSIS, COMPLETE
Ketone: NEGATIVE
Ph: 7 (ref 4.5–8.0)
RBC,UR: NONE SEEN /HPF (ref 0–5)

## 2013-03-29 LAB — CBC
MCH: 26.9 pg (ref 26.0–34.0)
Platelet: 193 10*3/uL (ref 150–440)

## 2013-04-07 ENCOUNTER — Telehealth: Payer: Self-pay | Admitting: Internal Medicine

## 2013-04-07 NOTE — Telephone Encounter (Signed)
No. I cannot provide her with clearance. She will need to establish with another doctor.

## 2013-04-07 NOTE — Telephone Encounter (Signed)
Fwd to Dr. Dan Humphreys, letter on ledge in reference to this

## 2013-04-07 NOTE — Telephone Encounter (Signed)
Pt came by office and is having dental surgery tomorrow morning at 11 am. Her Dentist is saying that she is needing clearance and medical history sent to their office. Pt has been dismissed and I was not sure if we could do this for her ?? I took the paper that she brought in from the dentist office and put it on Carol's desk. She is wanting a call back to let her know if we could do this for her or not because she says she has not been able to find another primary care provider yet.

## 2013-04-08 NOTE — Telephone Encounter (Signed)
Left detailed message on patient cell phone voicemail. Patient called right back after not answering her phone, informed patient Dr. Dan Humphreys could not give her medical clearance because she is no longer a patient here and I would leave her form up front for her to pick up at her convenience. Per patient, just throw it away because she got her another primary care physician yesterday and he signed off on it.

## 2013-04-18 LAB — COMPREHENSIVE METABOLIC PANEL
Albumin: 2.9 g/dL — ABNORMAL LOW (ref 3.4–5.0)
Alkaline Phosphatase: 149 U/L — ABNORMAL HIGH (ref 50–136)
Anion Gap: 10 (ref 7–16)
Bilirubin,Total: 0.6 mg/dL (ref 0.2–1.0)
Chloride: 105 mmol/L (ref 98–107)
Co2: 23 mmol/L (ref 21–32)
EGFR (Non-African Amer.): >60
Glucose: 177 mg/dL — ABNORMAL HIGH (ref 65–99)
Osmolality: 277 (ref 275–301)
Potassium: 3.4 mmol/L — ABNORMAL LOW (ref 3.5–5.1)
SGOT(AST): 53 U/L — ABNORMAL HIGH (ref 15–37)
Sodium: 138 mmol/L (ref 136–145)

## 2013-04-18 LAB — DRUG SCREEN, URINE
Barbiturates, Ur Screen: NEGATIVE (ref ?–200)
Methadone, Ur Screen: NEGATIVE (ref ?–300)
Tricyclic, Ur Screen: POSITIVE (ref ?–1000)

## 2013-04-18 LAB — URINALYSIS, COMPLETE
Ketone: NEGATIVE
Specific Gravity: 1.004 (ref 1.003–1.030)

## 2013-04-18 LAB — CBC
HCT: 31.9 % — ABNORMAL LOW (ref 35.0–47.0)
MCV: 84 fL (ref 80–100)
RBC: 3.81 10*6/uL (ref 3.80–5.20)
RDW: 17.4 % — ABNORMAL HIGH (ref 11.5–14.5)

## 2013-04-19 ENCOUNTER — Inpatient Hospital Stay: Payer: Self-pay | Admitting: Internal Medicine

## 2013-04-19 LAB — URINALYSIS, COMPLETE
Bacteria: NONE SEEN
Bilirubin,UR: NEGATIVE
Ketone: NEGATIVE
Leukocyte Esterase: NEGATIVE
RBC,UR: NONE SEEN /HPF (ref 0–5)
WBC UR: 1 /HPF (ref 0–5)

## 2013-04-20 LAB — CBC WITH DIFFERENTIAL/PLATELET
Basophil #: 0 10*3/uL (ref 0.0–0.1)
Basophil %: 0.2 %
Eosinophil #: 0 10*3/uL (ref 0.0–0.7)
HGB: 9.2 g/dL — ABNORMAL LOW (ref 12.0–16.0)
Lymphocyte %: 14.6 %
MCH: 27 pg (ref 26.0–34.0)
MCHC: 32.2 g/dL (ref 32.0–36.0)
Monocyte #: 0.9 x10 3/mm (ref 0.2–0.9)
Neutrophil #: 8.8 10*3/uL — ABNORMAL HIGH (ref 1.4–6.5)
Neutrophil %: 76.9 %
RDW: 17.5 % — ABNORMAL HIGH (ref 11.5–14.5)
WBC: 11.4 10*3/uL — ABNORMAL HIGH (ref 3.6–11.0)

## 2013-04-20 LAB — URINE CULTURE

## 2013-04-20 LAB — BASIC METABOLIC PANEL
Anion Gap: 5 — ABNORMAL LOW (ref 7–16)
BUN: 10 mg/dL (ref 7–18)
Calcium, Total: 8.6 mg/dL (ref 8.5–10.1)
Creatinine: 0.93 mg/dL (ref 0.60–1.30)
Osmolality: 283 (ref 275–301)
Potassium: 3.8 mmol/L (ref 3.5–5.1)
Sodium: 141 mmol/L (ref 136–145)

## 2013-04-20 LAB — PHENYTOIN LEVEL, TOTAL: Dilantin: 5.1 ug/mL — ABNORMAL LOW (ref 10.0–20.0)

## 2013-04-21 LAB — BASIC METABOLIC PANEL
Anion Gap: 5 — ABNORMAL LOW (ref 7–16)
BUN: 15 mg/dL (ref 7–18)
Calcium, Total: 8.8 mg/dL (ref 8.5–10.1)
Chloride: 107 mmol/L (ref 98–107)
Co2: 28 mmol/L (ref 21–32)
EGFR (African American): >60
EGFR (Non-African Amer.): >60
Glucose: 276 mg/dL — ABNORMAL HIGH (ref 65–99)
Potassium: 4.4 mmol/L (ref 3.5–5.1)
Sodium: 140 mmol/L (ref 136–145)

## 2013-04-21 LAB — CBC WITH DIFFERENTIAL/PLATELET
Basophil #: 0 10*3/uL (ref 0.0–0.1)
Eosinophil #: 0 10*3/uL (ref 0.0–0.7)
Eosinophil %: 0 %
HCT: 28.6 % — ABNORMAL LOW (ref 35.0–47.0)
HGB: 9.2 g/dL — ABNORMAL LOW (ref 12.0–16.0)
Lymphocyte %: 11.4 %
MCH: 27 pg (ref 26.0–34.0)
MCV: 84 fL (ref 80–100)
Monocyte #: 0.3 x10 3/mm (ref 0.2–0.9)
Monocyte %: 3.5 %
Neutrophil #: 8 10*3/uL — ABNORMAL HIGH (ref 1.4–6.5)

## 2013-04-21 LAB — URINE CULTURE

## 2013-04-21 LAB — VANCOMYCIN, TROUGH: Vancomycin, Trough: 9 ug/mL — ABNORMAL LOW (ref 10–20)

## 2013-04-21 LAB — MAGNESIUM: Magnesium: 2.2 mg/dL

## 2013-04-22 LAB — BASIC METABOLIC PANEL
Calcium, Total: 8.6 mg/dL (ref 8.5–10.1)
Chloride: 104 mmol/L (ref 98–107)
Creatinine: 0.83 mg/dL (ref 0.60–1.30)
EGFR (African American): >60
Glucose: 307 mg/dL — ABNORMAL HIGH (ref 65–99)
Potassium: 4.9 mmol/L (ref 3.5–5.1)

## 2013-04-22 LAB — CBC WITH DIFFERENTIAL/PLATELET
Basophil %: 0.1 %
Eosinophil #: 0 10*3/uL (ref 0.0–0.7)
HCT: 29.3 % — ABNORMAL LOW (ref 35.0–47.0)
MCH: 27.3 pg (ref 26.0–34.0)
MCHC: 32.7 g/dL (ref 32.0–36.0)
MCV: 84 fL (ref 80–100)
Neutrophil #: 8.1 10*3/uL — ABNORMAL HIGH (ref 1.4–6.5)
Neutrophil %: 81 %
Platelet: 127 10*3/uL — ABNORMAL LOW (ref 150–440)
RDW: 18 % — ABNORMAL HIGH (ref 11.5–14.5)

## 2013-04-23 LAB — CULTURE, BLOOD (SINGLE)

## 2013-05-13 IMAGING — CR DG ABDOMEN 3V
1 series · 5 of 5 positions shown · non-contrast
Comparison: none

REASON FOR EXAM: ab dpain
COMMENTS:

PROCEDURE:     DXR - DXR ABDOMEN 3-WAY (INCL PA CXR)  - June 03, 2011  [DATE]
RESULT:     Comparison: 11/13/2010

[Series 1: w chest pa · 0.14mm/px · 5 of 5 slices shown]
[im 1/5]
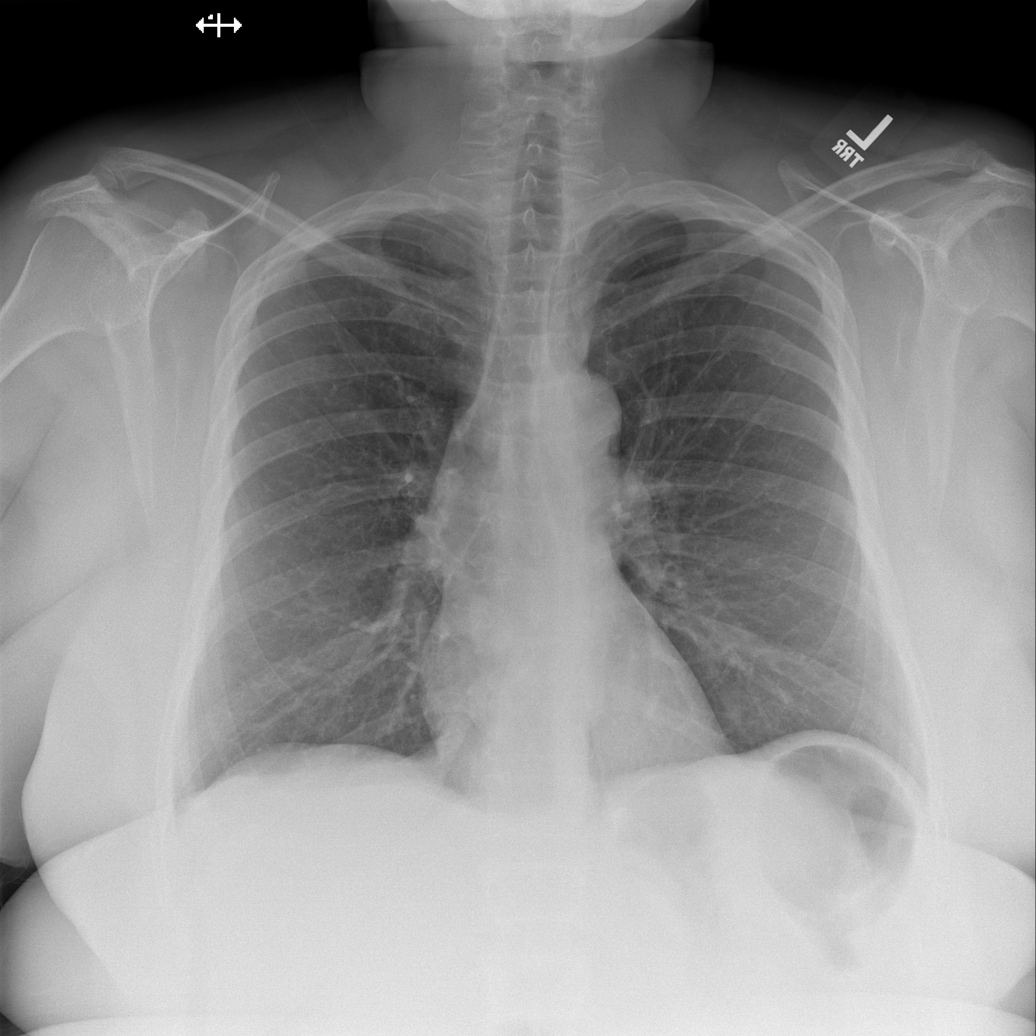
[im 2/5]
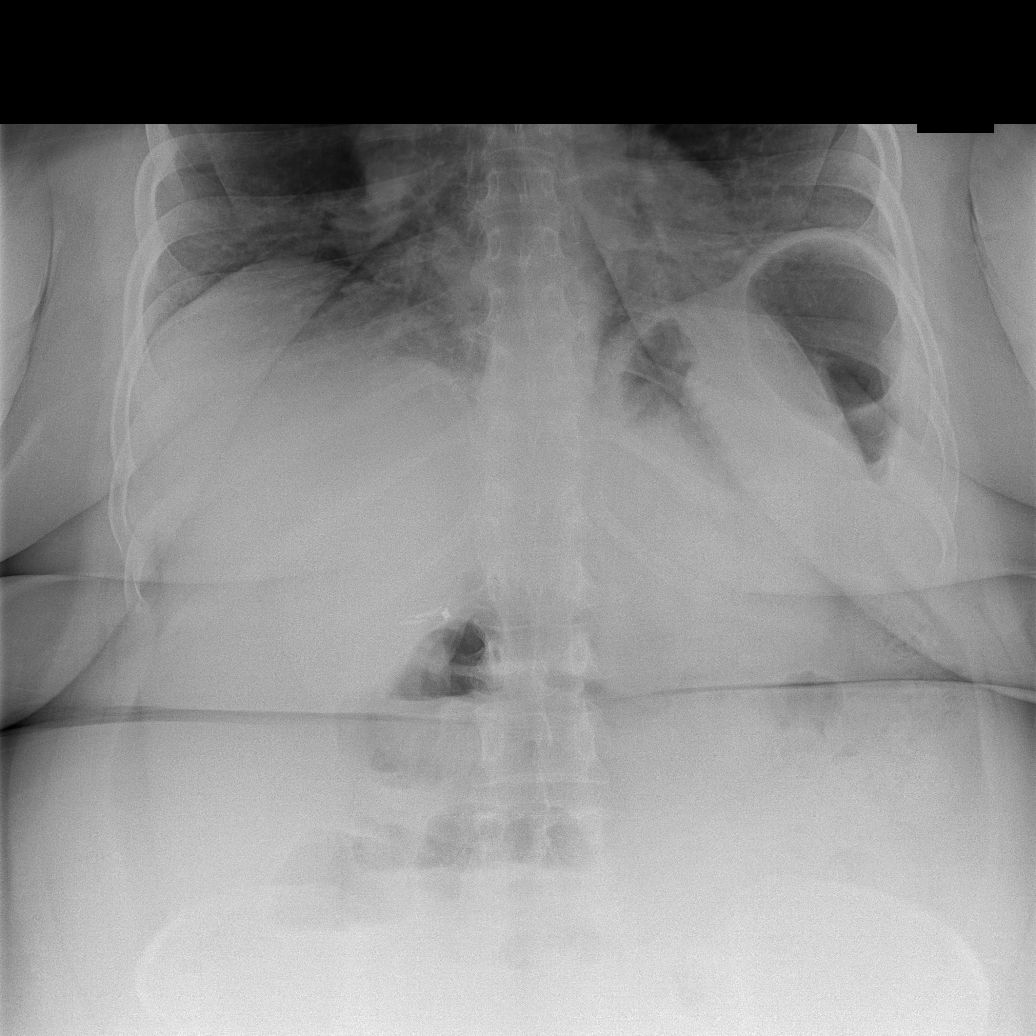
[im 3/5]
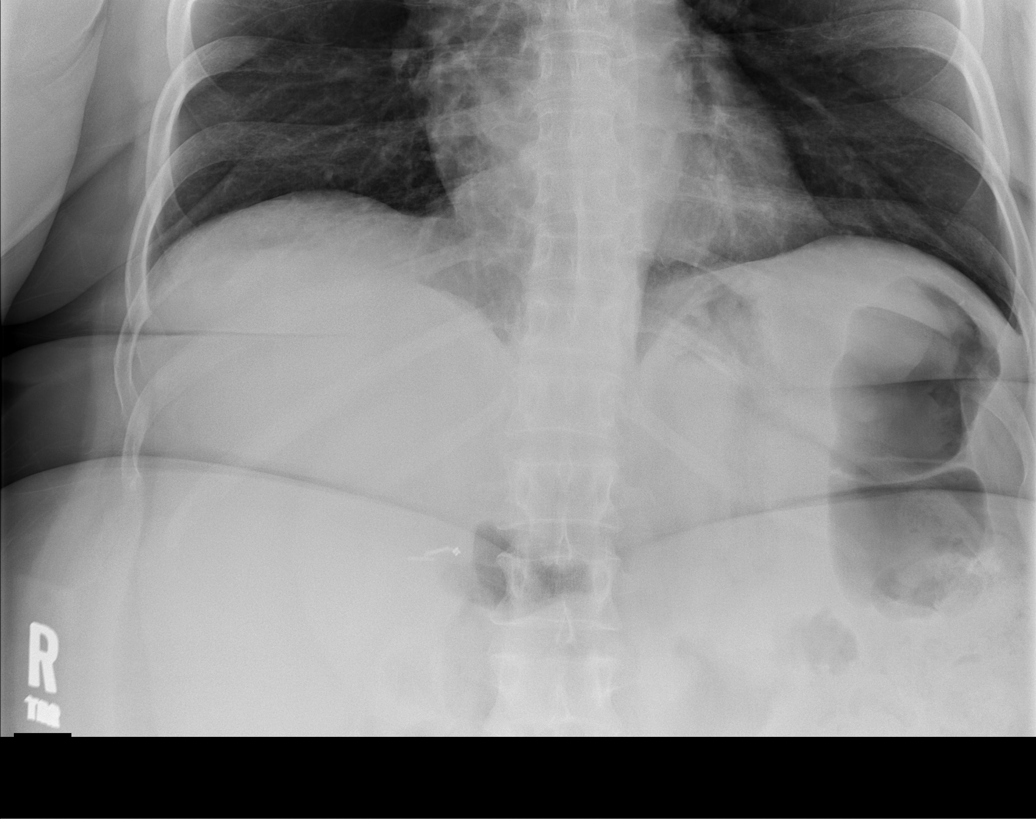
[im 4/5]
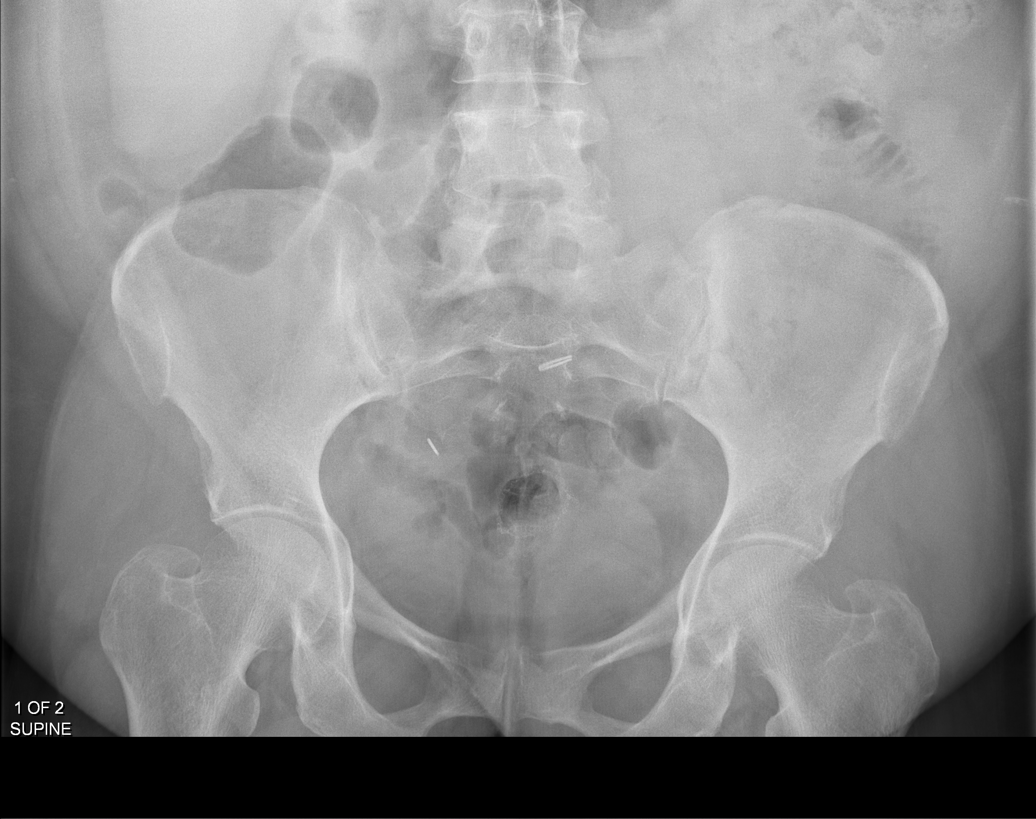
[im 5/5]
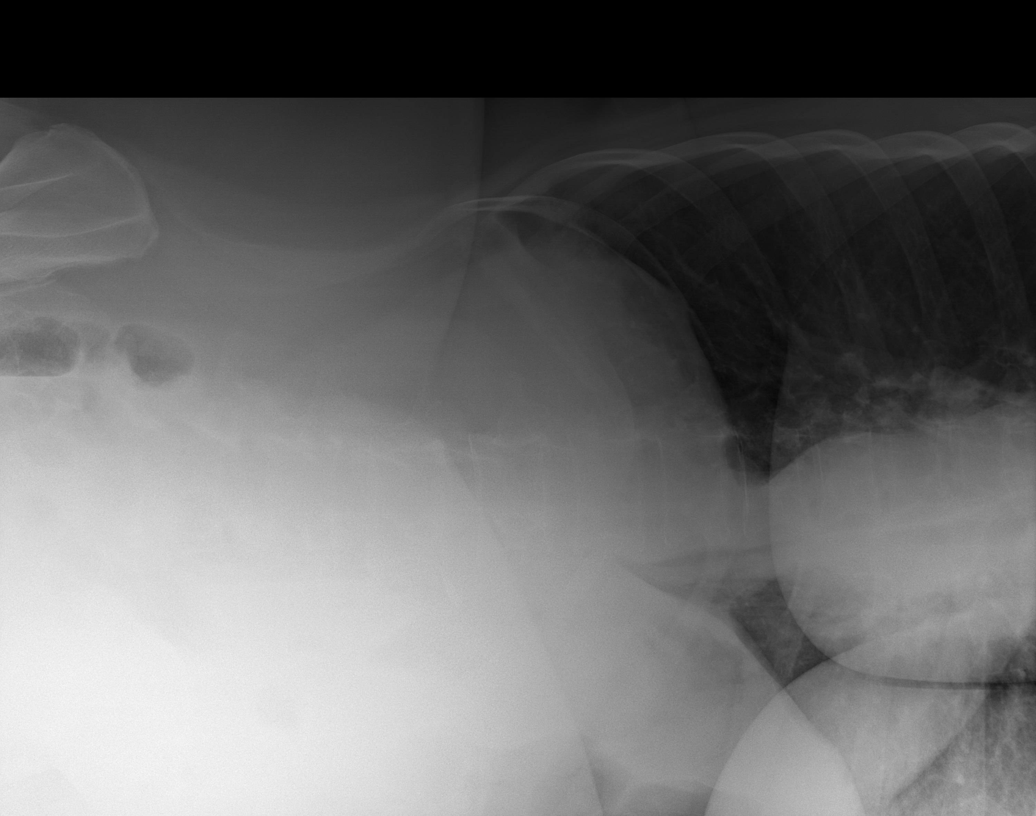

[5 of 5 positions shown; findings below may reference images not displayed]

FINDINGS: Heart and mediastinum are within normal limits. The lungs are clear.

No free intraperitoneal air. Air is seen within nondilated small and large
bowel. Surgical clips seen from prior cholecystectomy.
IMPRESSION: Nonobstructed bowel gas pattern.

## 2013-05-24 ENCOUNTER — Emergency Department: Payer: Self-pay | Admitting: Emergency Medicine

## 2013-05-24 LAB — URINALYSIS, COMPLETE
Bilirubin,UR: NEGATIVE
Glucose,UR: NEGATIVE mg/dL (ref 0–75)
Hyaline Cast: 2
Nitrite: NEGATIVE
Ph: 6 (ref 4.5–8.0)
Protein: NEGATIVE
RBC,UR: 1 /HPF (ref 0–5)
Specific Gravity: 1.011 (ref 1.003–1.030)
Squamous Epithelial: 7

## 2013-05-24 LAB — COMPREHENSIVE METABOLIC PANEL
Albumin: 3.3 g/dL — ABNORMAL LOW (ref 3.4–5.0)
Alkaline Phosphatase: 193 U/L — ABNORMAL HIGH (ref 50–136)
Anion Gap: 6 — ABNORMAL LOW (ref 7–16)
BUN: 7 mg/dL (ref 7–18)
Chloride: 104 mmol/L (ref 98–107)
Creatinine: 1.12 mg/dL (ref 0.60–1.30)
Osmolality: 279 (ref 275–301)
Potassium: 3.8 mmol/L (ref 3.5–5.1)
SGOT(AST): 75 U/L — ABNORMAL HIGH (ref 15–37)
Total Protein: 8.4 g/dL — ABNORMAL HIGH (ref 6.4–8.2)

## 2013-05-24 LAB — CBC WITH DIFFERENTIAL/PLATELET
Eosinophil #: 0.2 10*3/uL (ref 0.0–0.7)
Lymphocyte #: 2.8 10*3/uL (ref 1.0–3.6)
Lymphocyte %: 36.8 %
MCH: 25.9 pg — ABNORMAL LOW (ref 26.0–34.0)
Monocyte #: 0.8 x10 3/mm (ref 0.2–0.9)
Neutrophil #: 3.7 10*3/uL (ref 1.4–6.5)
Platelet: 139 10*3/uL — ABNORMAL LOW (ref 150–440)
RBC: 4.19 10*6/uL (ref 3.80–5.20)
RDW: 17.3 % — ABNORMAL HIGH (ref 11.5–14.5)

## 2013-06-06 LAB — BASIC METABOLIC PANEL
BUN: 8 mg/dL (ref 7–18)
Chloride: 107 mmol/L (ref 98–107)
Co2: 24 mmol/L (ref 21–32)
EGFR (African American): >60
EGFR (Non-African Amer.): >60
Glucose: 220 mg/dL — ABNORMAL HIGH (ref 65–99)
Osmolality: 277 (ref 275–301)

## 2013-06-06 LAB — TROPONIN I: Troponin-I: <0.02 ng/mL

## 2013-06-06 LAB — CBC
HGB: 10.1 g/dL — ABNORMAL LOW (ref 12.0–16.0)
MCHC: 32.3 g/dL (ref 32.0–36.0)
MCV: 82 fL (ref 80–100)
Platelet: 126 10*3/uL — ABNORMAL LOW (ref 150–440)
RBC: 3.83 10*6/uL (ref 3.80–5.20)
RDW: 17.4 % — ABNORMAL HIGH (ref 11.5–14.5)
WBC: 7.8 10*3/uL (ref 3.6–11.0)

## 2013-06-07 LAB — CK-MB
CK-MB: 1.8 ng/mL (ref 0.5–3.6)
CK-MB: 1.7 ng/mL (ref 0.5–3.6)
CK-MB: 1.8 ng/mL (ref 0.5–3.6)

## 2013-06-07 LAB — DRUG SCREEN, URINE
Amphetamines, Ur Screen: NEGATIVE (ref ?–1000)
Barbiturates, Ur Screen: NEGATIVE (ref ?–200)
Benzodiazepine, Ur Scrn: NEGATIVE (ref ?–200)
Cocaine Metabolite,Ur ~~LOC~~: NEGATIVE (ref ?–300)
MDMA (Ecstasy)Ur Screen: NEGATIVE (ref ?–500)
Methadone, Ur Screen: NEGATIVE (ref ?–300)
Opiate, Ur Screen: POSITIVE (ref ?–300)
Phencyclidine (PCP) Ur S: NEGATIVE (ref ?–25)

## 2013-06-07 LAB — TROPONIN I: Troponin-I: <0.02 ng/mL

## 2013-06-08 ENCOUNTER — Inpatient Hospital Stay: Payer: Self-pay | Admitting: Internal Medicine

## 2013-06-08 LAB — CBC WITH DIFFERENTIAL/PLATELET
Basophil #: 0.1 10*3/uL (ref 0.0–0.1)
Basophil #: 0.1 10*3/uL (ref 0.0–0.1)
Basophil %: 1.5 %
Basophil %: 0.7 %
Eosinophil #: 0.9 10*3/uL — ABNORMAL HIGH (ref 0.0–0.7)
Eosinophil %: 10.5 %
HCT: 30.1 % — ABNORMAL LOW (ref 35.0–47.0)
HGB: 10 g/dL — ABNORMAL LOW (ref 12.0–16.0)
Lymphocyte #: 3.5 10*3/uL (ref 1.0–3.6)
Lymphocyte %: 18.4 %
Lymphocyte %: 37.6 %
MCH: 26.2 pg (ref 26.0–34.0)
MCH: 26.3 pg (ref 26.0–34.0)
MCHC: 32.1 g/dL (ref 32.0–36.0)
MCV: 82 fL (ref 80–100)
MCV: 82 fL (ref 80–100)
Monocyte #: 1 x10 3/mm — ABNORMAL HIGH (ref 0.2–0.9)
Monocyte %: 5.9 %
Monocyte %: 10.5 %
Neutrophil %: 63.7 %
Neutrophil %: 50.6 %
Platelet: 139 10*3/uL — ABNORMAL LOW (ref 150–440)
RBC: 3.68 10*6/uL — ABNORMAL LOW (ref 3.80–5.20)
RDW: 17.7 % — ABNORMAL HIGH (ref 11.5–14.5)
WBC: 9.2 10*3/uL (ref 3.6–11.0)

## 2013-06-08 LAB — PROTIME-INR: INR: 1.3

## 2013-06-10 LAB — BASIC METABOLIC PANEL
Anion Gap: 5 — ABNORMAL LOW (ref 7–16)
Calcium, Total: 8.9 mg/dL (ref 8.5–10.1)
Chloride: 101 mmol/L (ref 98–107)
Co2: 27 mmol/L (ref 21–32)
Creatinine: 1.14 mg/dL (ref 0.60–1.30)
Osmolality: 269 (ref 275–301)
Sodium: 133 mmol/L — ABNORMAL LOW (ref 136–145)

## 2013-06-21 ENCOUNTER — Emergency Department: Payer: Self-pay | Admitting: Emergency Medicine

## 2013-07-07 IMAGING — CR DG CHEST 1V PORT
1 series · 1 of 1 positions shown · non-contrast
Comparison: none

REASON FOR EXAM: cough, fever
COMMENTS:

PROCEDURE:     DXR - DXR PORTABLE CHEST SINGLE VIEW  - July 28, 2011  [DATE]
RESULT:     The lungs are clear. The cardiac silhouette and visualized bony
skeleton are unremarkable.

[ap]
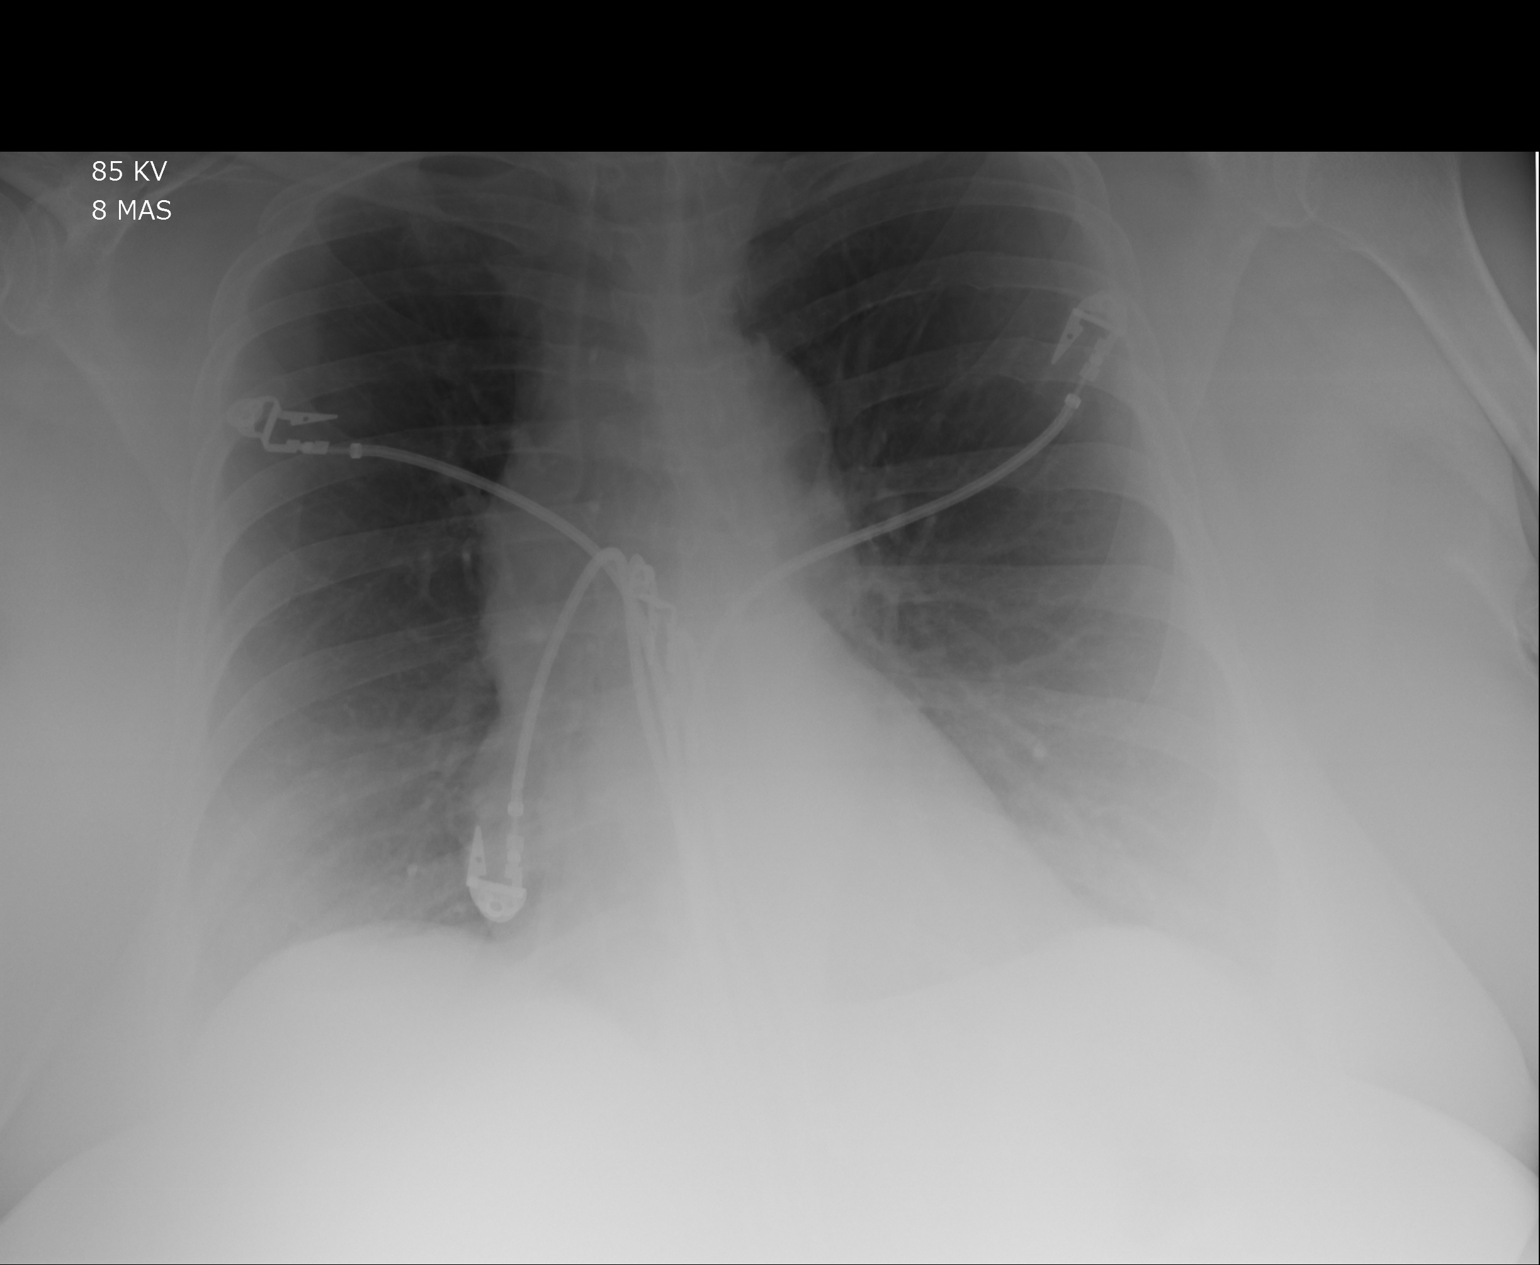

[1 of 1 positions shown; findings below may reference images not displayed]

IMPRESSION: 1. Chest radiograph without evidence of acute cardiopulmonary disease.
2. A comparison was made to prior study dated 11/13/2010.

## 2013-07-09 IMAGING — CT CT HEAD WITHOUT AND WITH CONTRAST
3 of 4 series · 17 of 30 positions shown, 19 images · IV contrast (isovue)
Comparison: none

REASON FOR EXAM: pt reports recurrence of brain tumor, previously
reported as benign, now reporte
COMMENTS:   LMP: Post-Menopausal

PROCEDURE:     CT  - CT HEAD W/WO  - July 30, 2011 [DATE]
RESULT:     Comparison:  None
INDICATION: Benign brain tumor.
TECHNIQUE: Multiple axial images were obtained prior to following 50 mL of
Isovue 300 IV contrast.

[Series 2: without · axial · non-contrast · 0.40mm/px · z∈[+222,+327]mm · 7 of 29 slices shown, 9 images]
[im 4/29  brain]
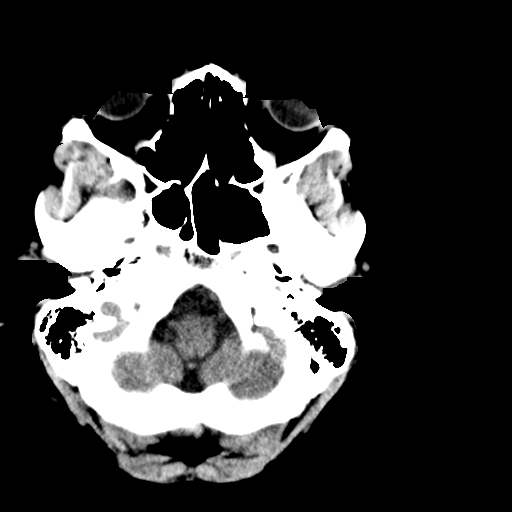
[im 4/29  bone]
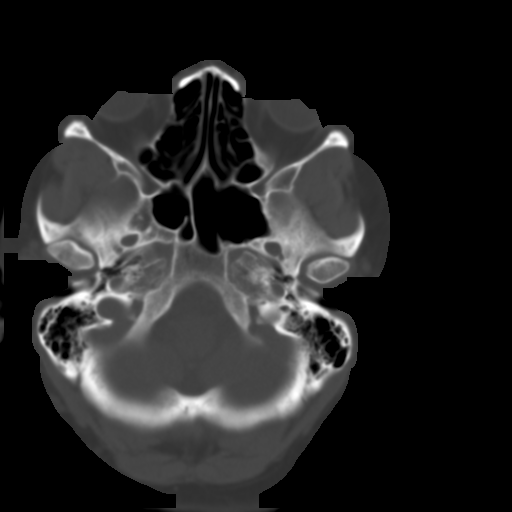
[im 8/29  brain]
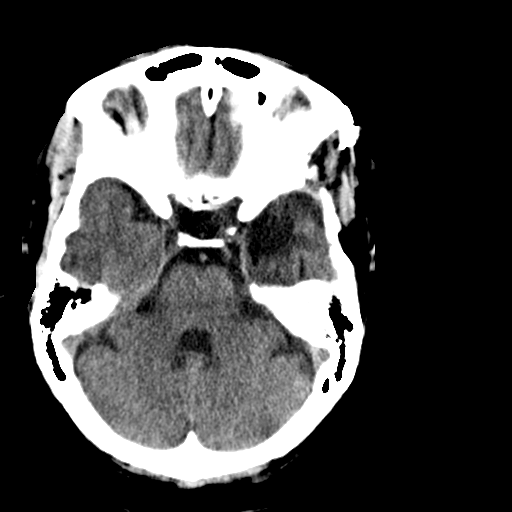
[im 11/29  brain]
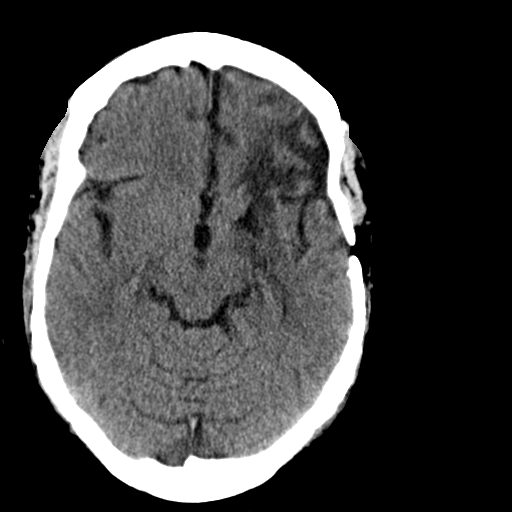
[im 15/29  brain]
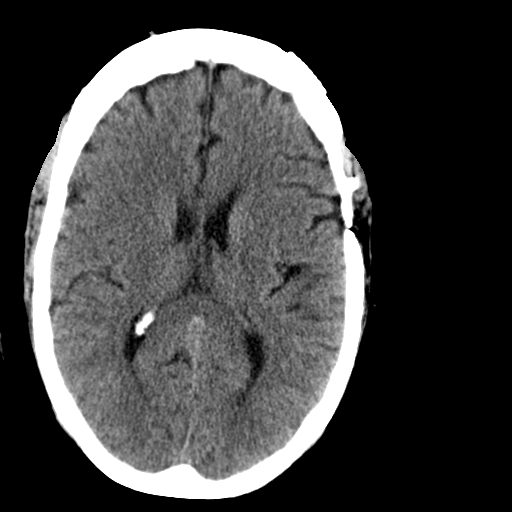
[im 18/29  brain]
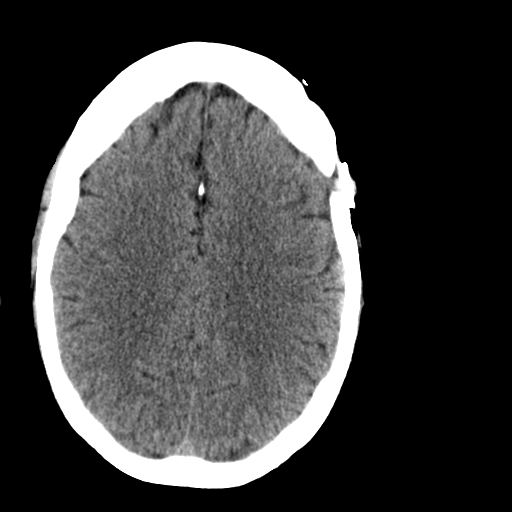
[im 18/29  bone]
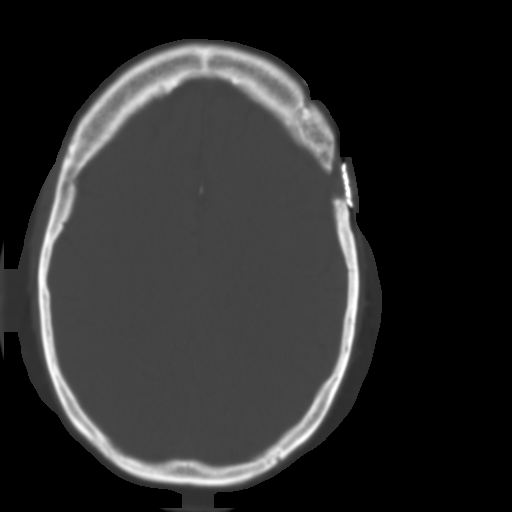
[im 22/29  brain]
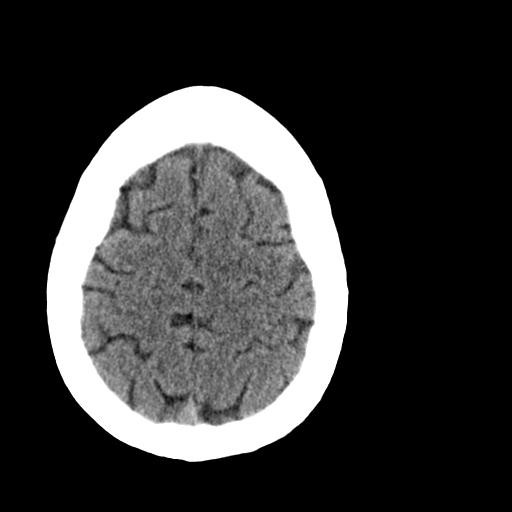
[im 25/29  brain]
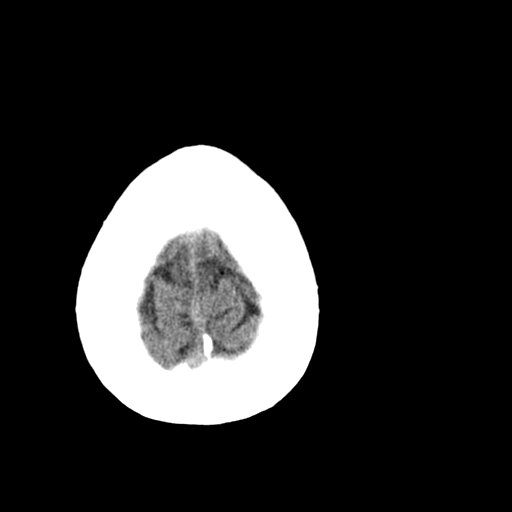

[Series 3: with · axial · 0.40mm/px · z∈[+222,+257]mm · 3 of 19 slices shown (1 of 2)]
[im 4/19  brain]
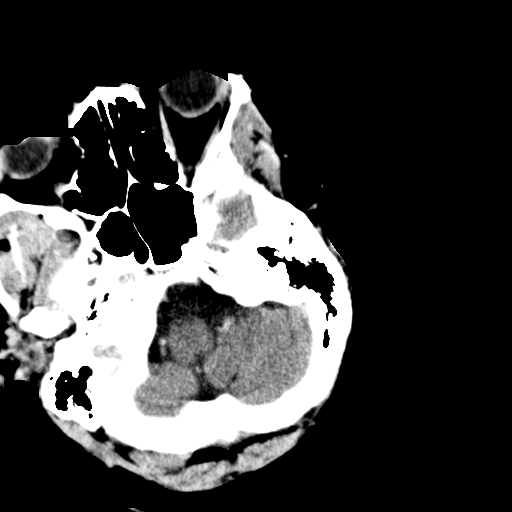
[im 8/19  brain]
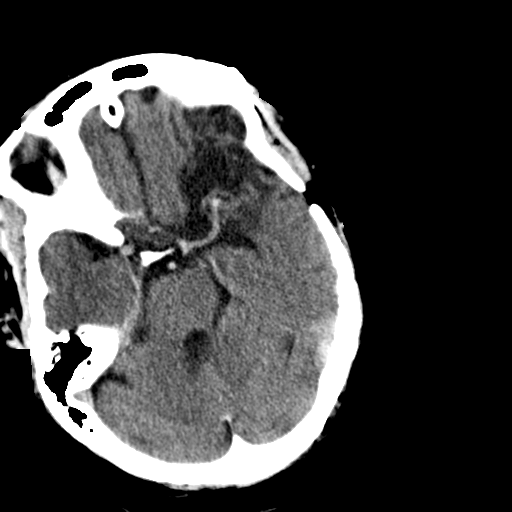
[im 11/19  brain]
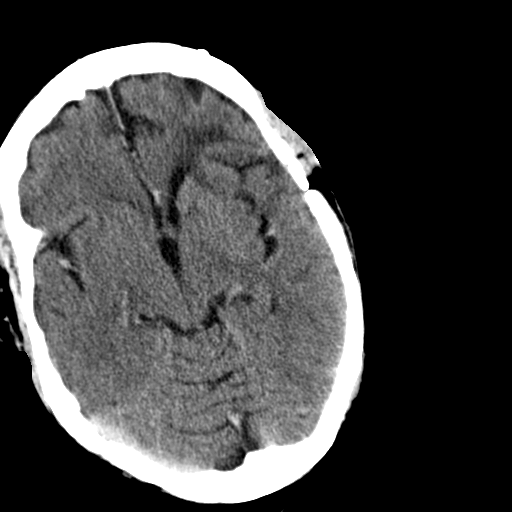

[Series 5: with · axial · 0.40mm/px · z∈[+222,+327]mm · 7 of 29 slices shown (2 of 2)]
[im 4/29  brain]
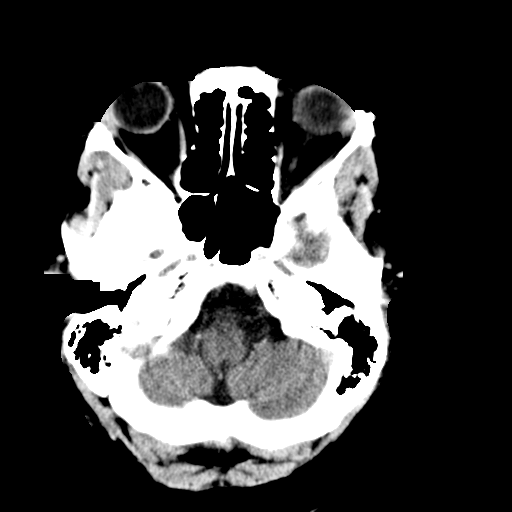
[im 8/29  brain]
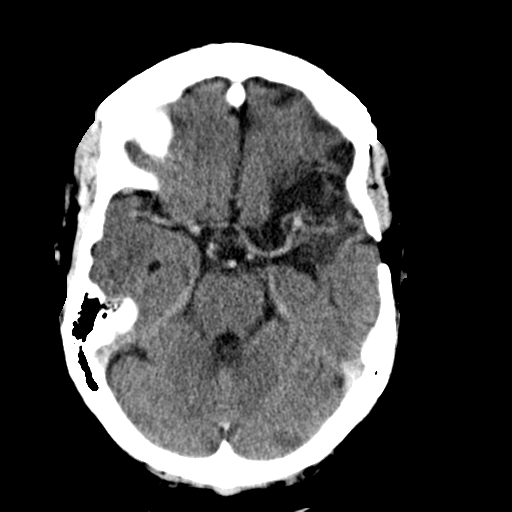
[im 11/29  brain]
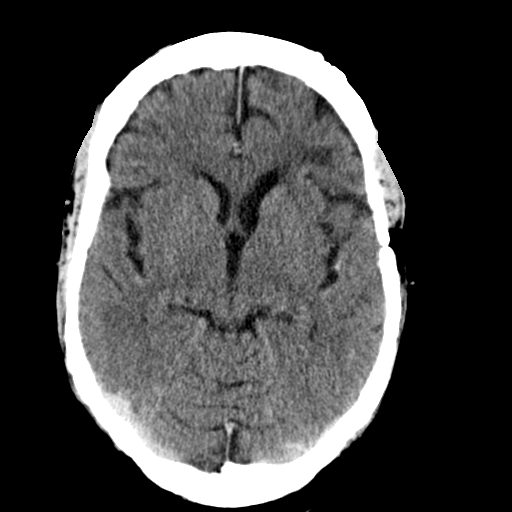
[im 15/29  brain]
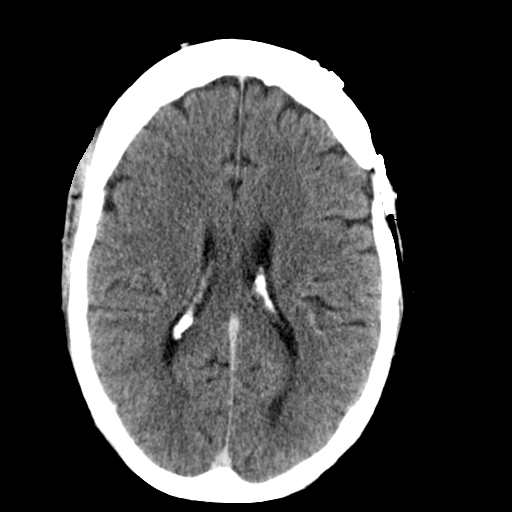
[im 18/29  brain]
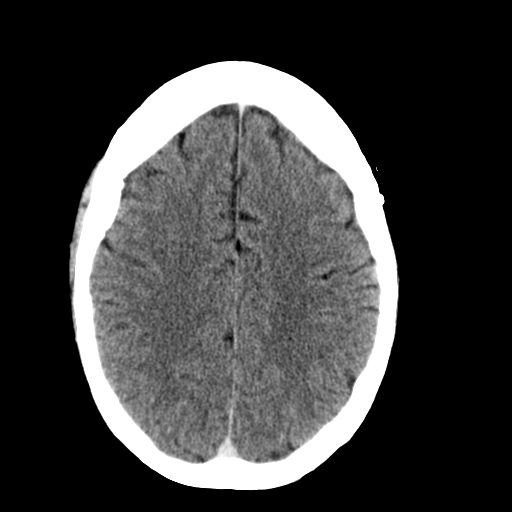
[im 22/29  brain]
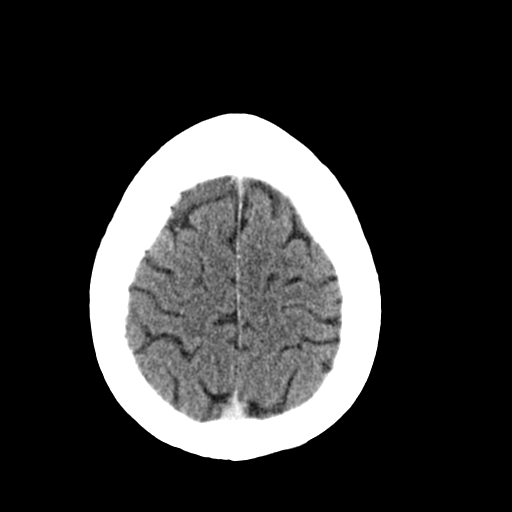
[im 25/29  brain]
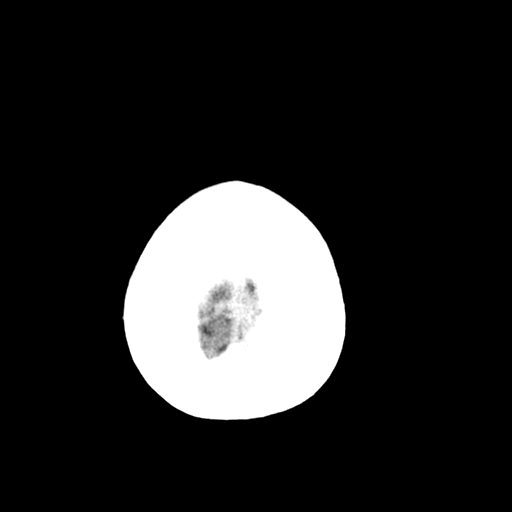

[17 of 30 positions shown; findings below may reference images not displayed]

FINDINGS: There is no abnormal parenchymal, leptomeningeal, or subependymal
enhancement. The gray-white interface is maintained. There is no evidence
for mass effect, midline shift, or extra-axial fluid collections. There is
left inferior frontal temporal lobe encephalomalacia consistent with prior
surgery. There is overlying left temporal lobe craniotomy.

The basal cisterns are patent. There is no evidence for cortical-based area
of infarction.  Ventricles and sulci are appropriate for the patient's age.

Visualized portions of the orbits and paranasal sinuses are unremarkable.
Osseous structures are negative for fracture, lytic, or blastic lesions.
IMPRESSION: 1. No acute intracranial process.

2. No abnormal areas of enhancement to suggest recurrent malignancy..

## 2013-07-10 IMAGING — MR MRI HEAD WITHOUT CONTRAST
9 of 13 series · 26 of 48 positions shown · IV contrast (gadolinium)
Comparison: none

REASON FOR EXAM: (1) negative CT wwo, MRI wwo to confirm; pt has had MRCP
and MR cholangiogram in
COMMENTS:   LMP: Post-Menopausal

PROCEDURE:     MR  - MR BRAIN WO/ORBITS W/O CONTRAST  - July 31, 2011 [DATE]
RESULT:
TECHNIQUE: Multiplanar and multisequence imaging of the brain and orbits
was obtained without administration of Gadolinium. The orbital portion of
the study is markedly degraded by motion artifact and thus, the patient did
not receive IV contrast.

[Series 3: T1 · axial · 10.0mm · 1.11mm/px · z∈[+0,+95]mm · 3 of 26 slices shown (1 of 4)]
[im 1/26]
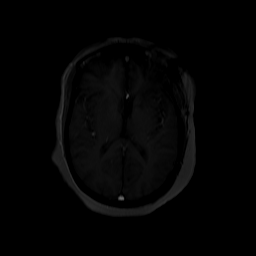
[im 13/26]
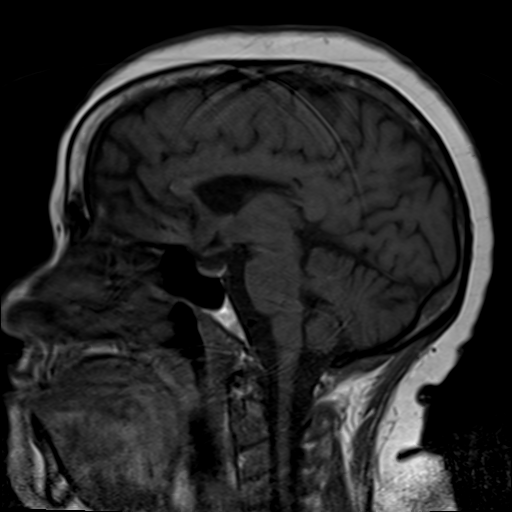
[im 26/26]
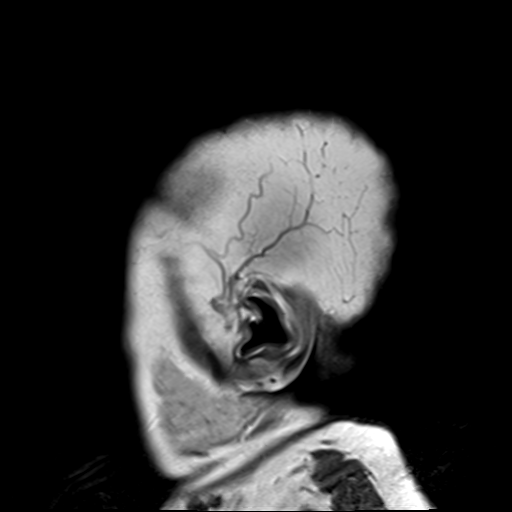

[Series 6: T2 · axial · 4.0mm · 0.62mm/px · z∈[-66,+129]mm · 4 of 34 slices shown (1 of 2)]
[im 1/34]
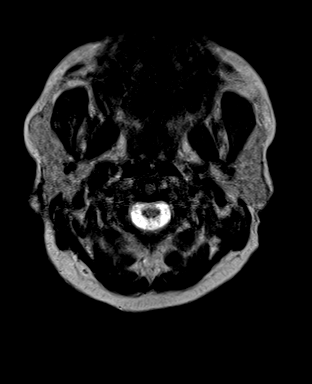
[im 12/34]
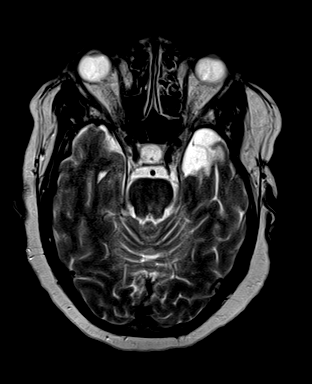
[im 23/34]
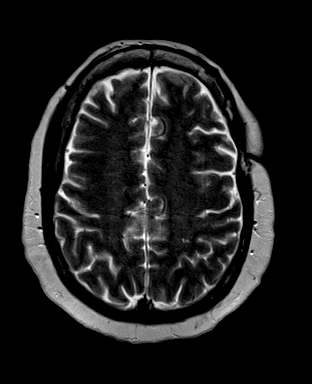
[im 34/34]
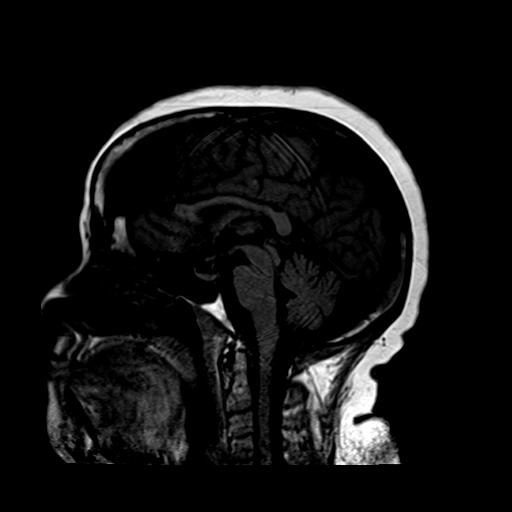

[Series 8: FLAIR · axial · 5.0mm · 0.45mm/px · z∈[-65,+122]mm · 3 of 25 slices shown]
[im 1/25]
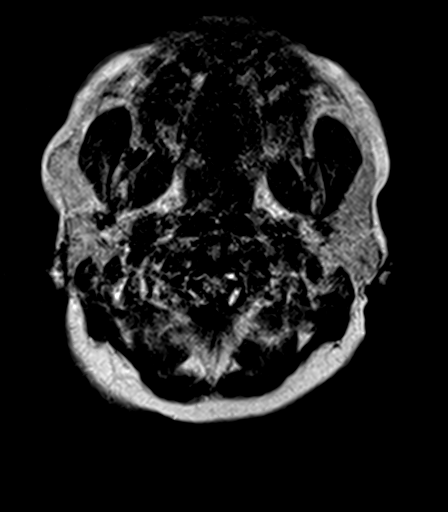
[im 13/25]
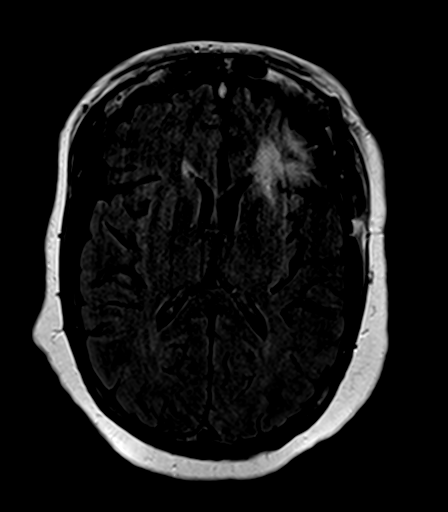
[im 25/25]
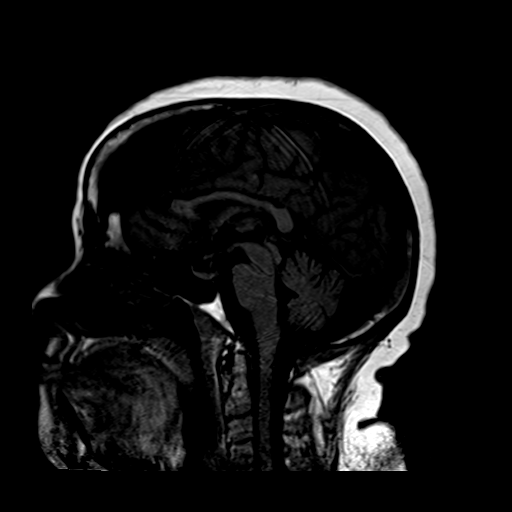

[Series 10: T1 · axial · 3.0mm · 0.39mm/px · z∈[-36,+101]mm · 2 of 16 slices shown (2 of 4)]
[im 1/16]
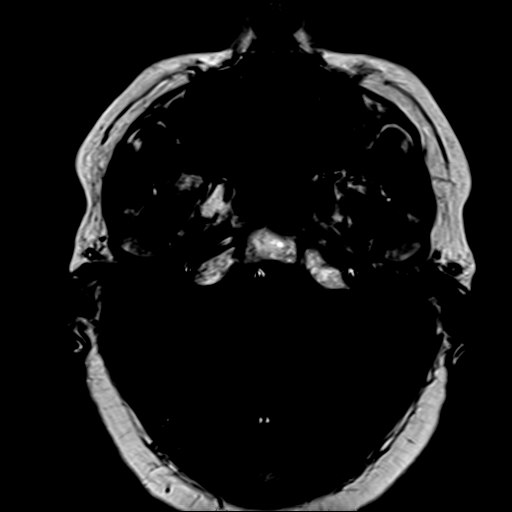
[im 16/16]
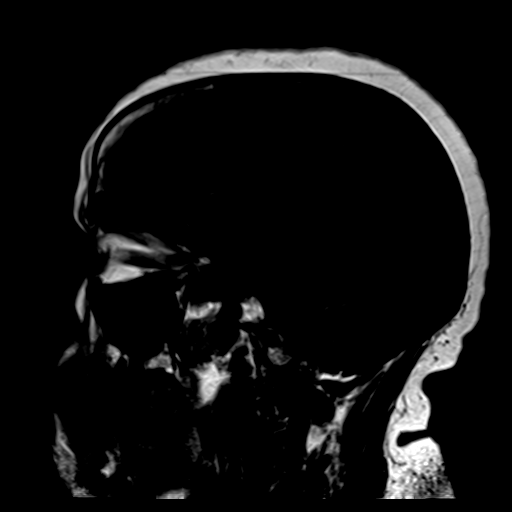

[Series 12: T1 · axial · 4.0mm · 0.47mm/px · z∈[-11,+81]mm · 2 of 22 slices shown (3 of 4)]
[im 1/22]
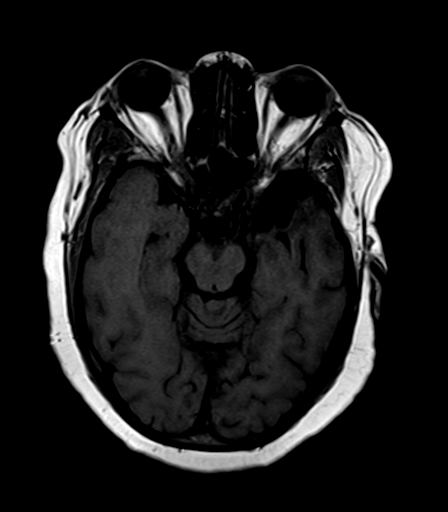
[im 22/22]
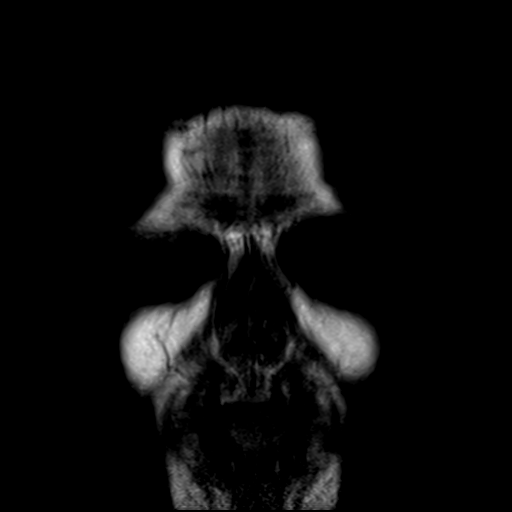

[Series 13: T1 · axial · 4.0mm · 0.72mm/px · 1 of 122 slices shown (4 of 4)]
[im 1/122]
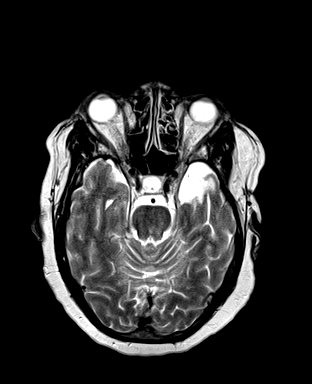

[Series 5002: DWI · axial · 5.0mm · 1.80mm/px · z∈[-63,+123]mm · 3 of 24 slices shown]
[im 1/24]
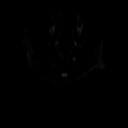
[im 12/24]
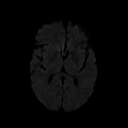
[im 24/24]
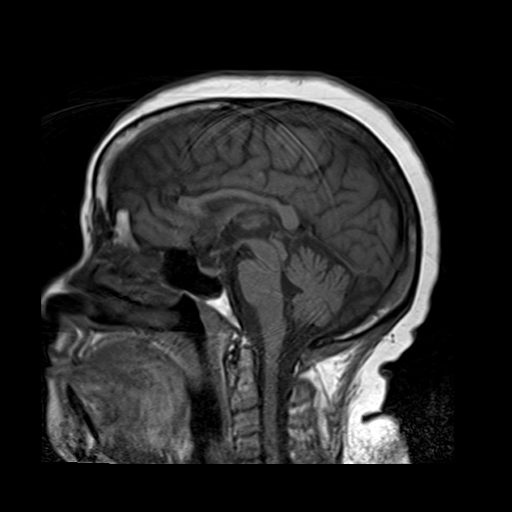

[Series 5005: ADC · axial · 5.0mm · 1.80mm/px · z∈[-63,+120]mm · 3 of 24 slices shown]
[im 1/24]
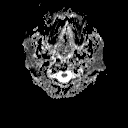
[im 12/24]
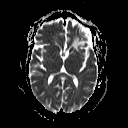
[im 24/24]
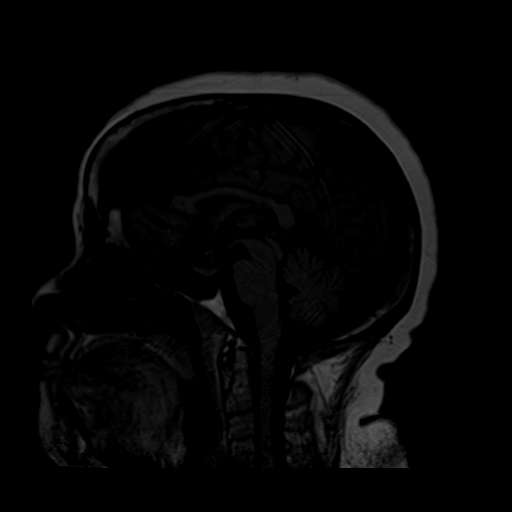

[Series 5011: T2 · axial · 1.0mm · 0.37mm/px · z∈[-39,+104]mm · 5 of 49 slices shown (2 of 2)]
[im 1/49]
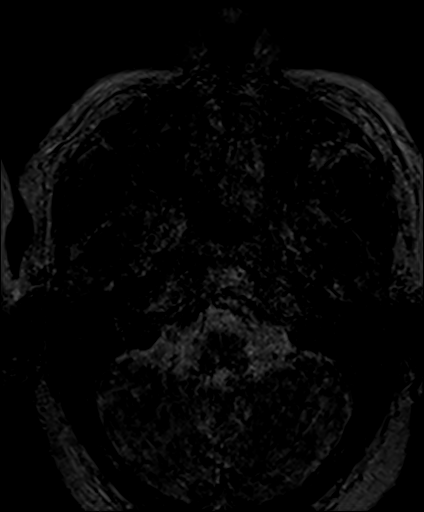
[im 13/49]
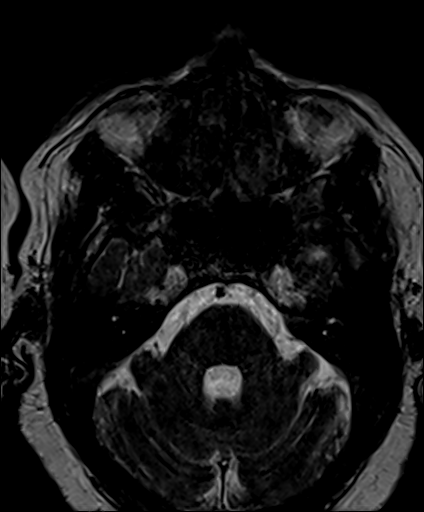
[im 25/49]
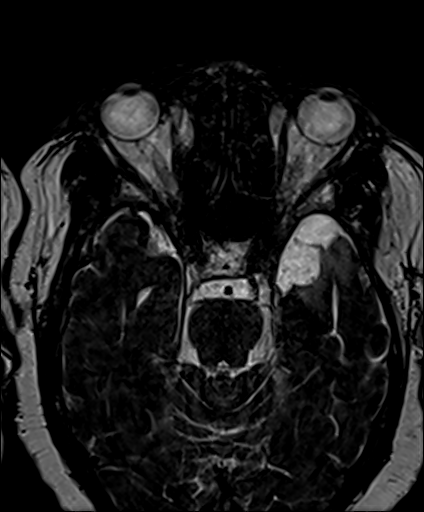
[im 37/49]
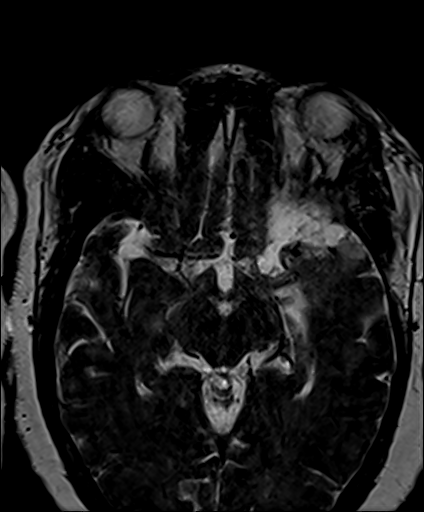
[im 49/49]
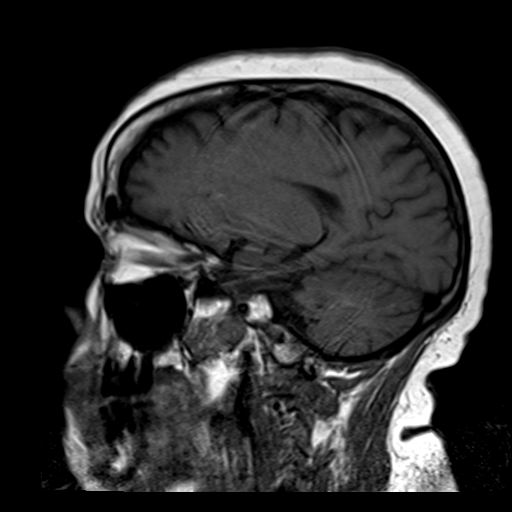

[26 of 48 positions shown; findings below may reference images not displayed]

FINDINGS: The diffusion weighted imaging demonstrates no evidence of foci of
restricted diffusion. An area of encephalomalacic change projects within the
base of the left frontal region. There is diffuse cortical atrophy. The
sella and parasellar regions and structures as well as the cerebellopontine
angle regions demonstrate no signal abnormalities. The ventricles and
cisterns are patent. The cerebellum, pons and midbrain demonstrate no signal
abnormalities. Evaluation of the orbits demonstrates no gross evidence of
masses.
IMPRESSION: 1.  Involutional and chronic changes. Area of encephalomalacia projects
within the base of the left frontal region.
2.  Limited evaluation of the orbits is unremarkable.

## 2013-07-20 ENCOUNTER — Emergency Department: Payer: Self-pay | Admitting: Emergency Medicine

## 2013-08-03 ENCOUNTER — Ambulatory Visit: Payer: Self-pay | Admitting: Orthopedic Surgery

## 2013-08-03 LAB — CBC
HCT: 35.3 % (ref 35.0–47.0)
HGB: 10.8 g/dL — ABNORMAL LOW (ref 12.0–16.0)
MCH: 25 pg — ABNORMAL LOW (ref 26.0–34.0)
MCHC: 30.6 g/dL — ABNORMAL LOW (ref 32.0–36.0)
MCV: 82 fL (ref 80–100)
PLATELETS: 145 10*3/uL — AB (ref 150–440)
RBC: 4.32 10*6/uL (ref 3.80–5.20)
RDW: 18 % — ABNORMAL HIGH (ref 11.5–14.5)
WBC: 6.6 10*3/uL (ref 3.6–11.0)

## 2013-08-03 LAB — BASIC METABOLIC PANEL
ANION GAP: 5 — AB (ref 7–16)
BUN: 10 mg/dL (ref 7–18)
CHLORIDE: 102 mmol/L (ref 98–107)
CREATININE: 1.05 mg/dL (ref 0.60–1.30)
Calcium, Total: 9.1 mg/dL (ref 8.5–10.1)
Co2: 27 mmol/L (ref 21–32)
GFR CALC NON AF AMER: 59 — AB
GLUCOSE: 269 mg/dL — AB (ref 65–99)
Osmolality: 277 (ref 275–301)
Potassium: 3.6 mmol/L (ref 3.5–5.1)
Sodium: 134 mmol/L — ABNORMAL LOW (ref 136–145)

## 2013-08-03 LAB — PROTIME-INR
INR: 1.3
Prothrombin Time: 16.1 secs — ABNORMAL HIGH (ref 11.5–14.7)

## 2013-08-03 LAB — APTT: Activated PTT: 33.2 secs (ref 23.6–35.9)

## 2013-08-05 ENCOUNTER — Ambulatory Visit: Payer: Self-pay | Admitting: Orthopedic Surgery

## 2013-08-10 LAB — PATHOLOGY REPORT

## 2013-08-19 IMAGING — CR DG CHEST 2V
1 series · 2 of 2 positions shown · non-contrast
Comparison: none

REASON FOR EXAM: SOB
COMMENTS:

PROCEDURE:     DXR - DXR CHEST PA (OR AP) AND LATERAL  - September 09, 2011 [DATE]
RESULT:     The lungs are clear. The cardiac silhouette and visualized bony
skeleton are unremarkable.

[Series 1: pa · 0.17mm/px · 2 of 2 slices shown]
[im 1/2]
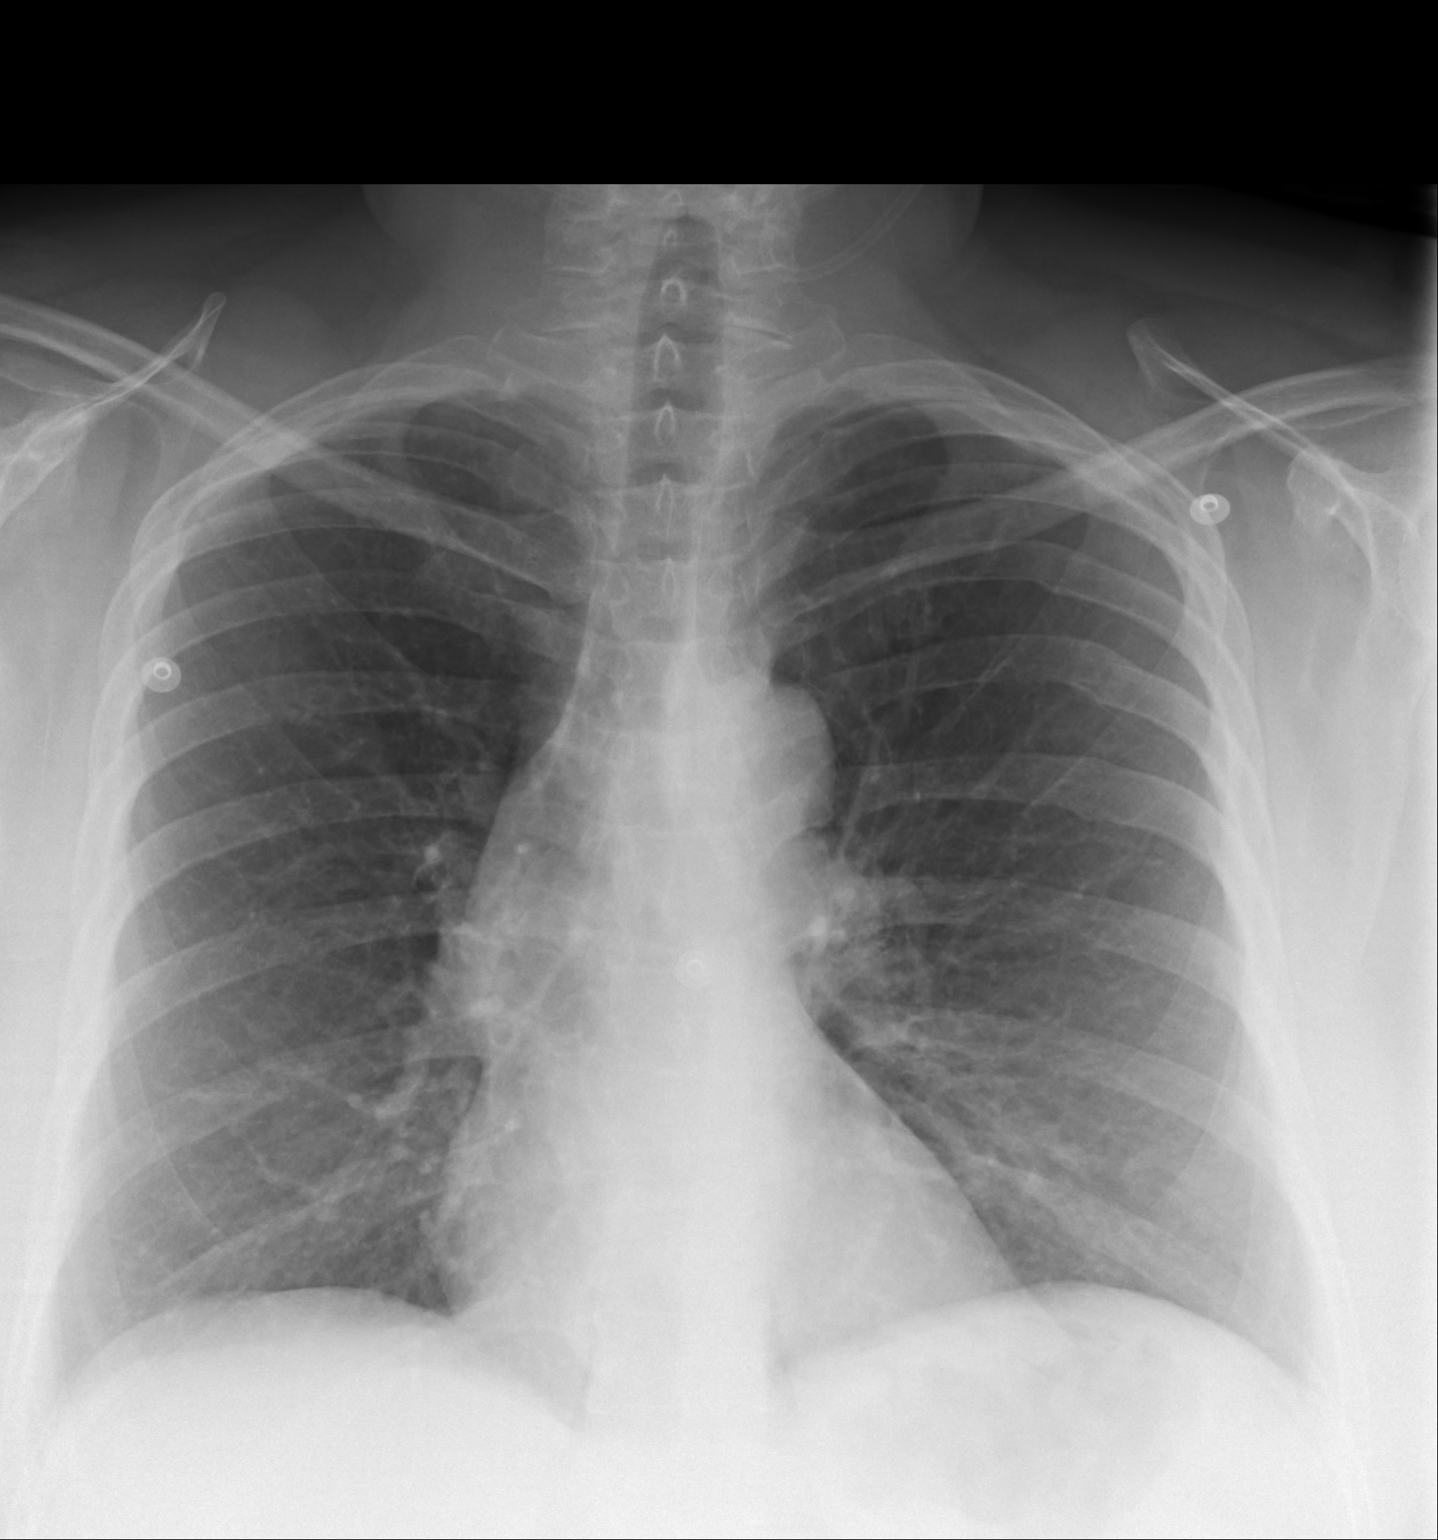
[im 2/2]
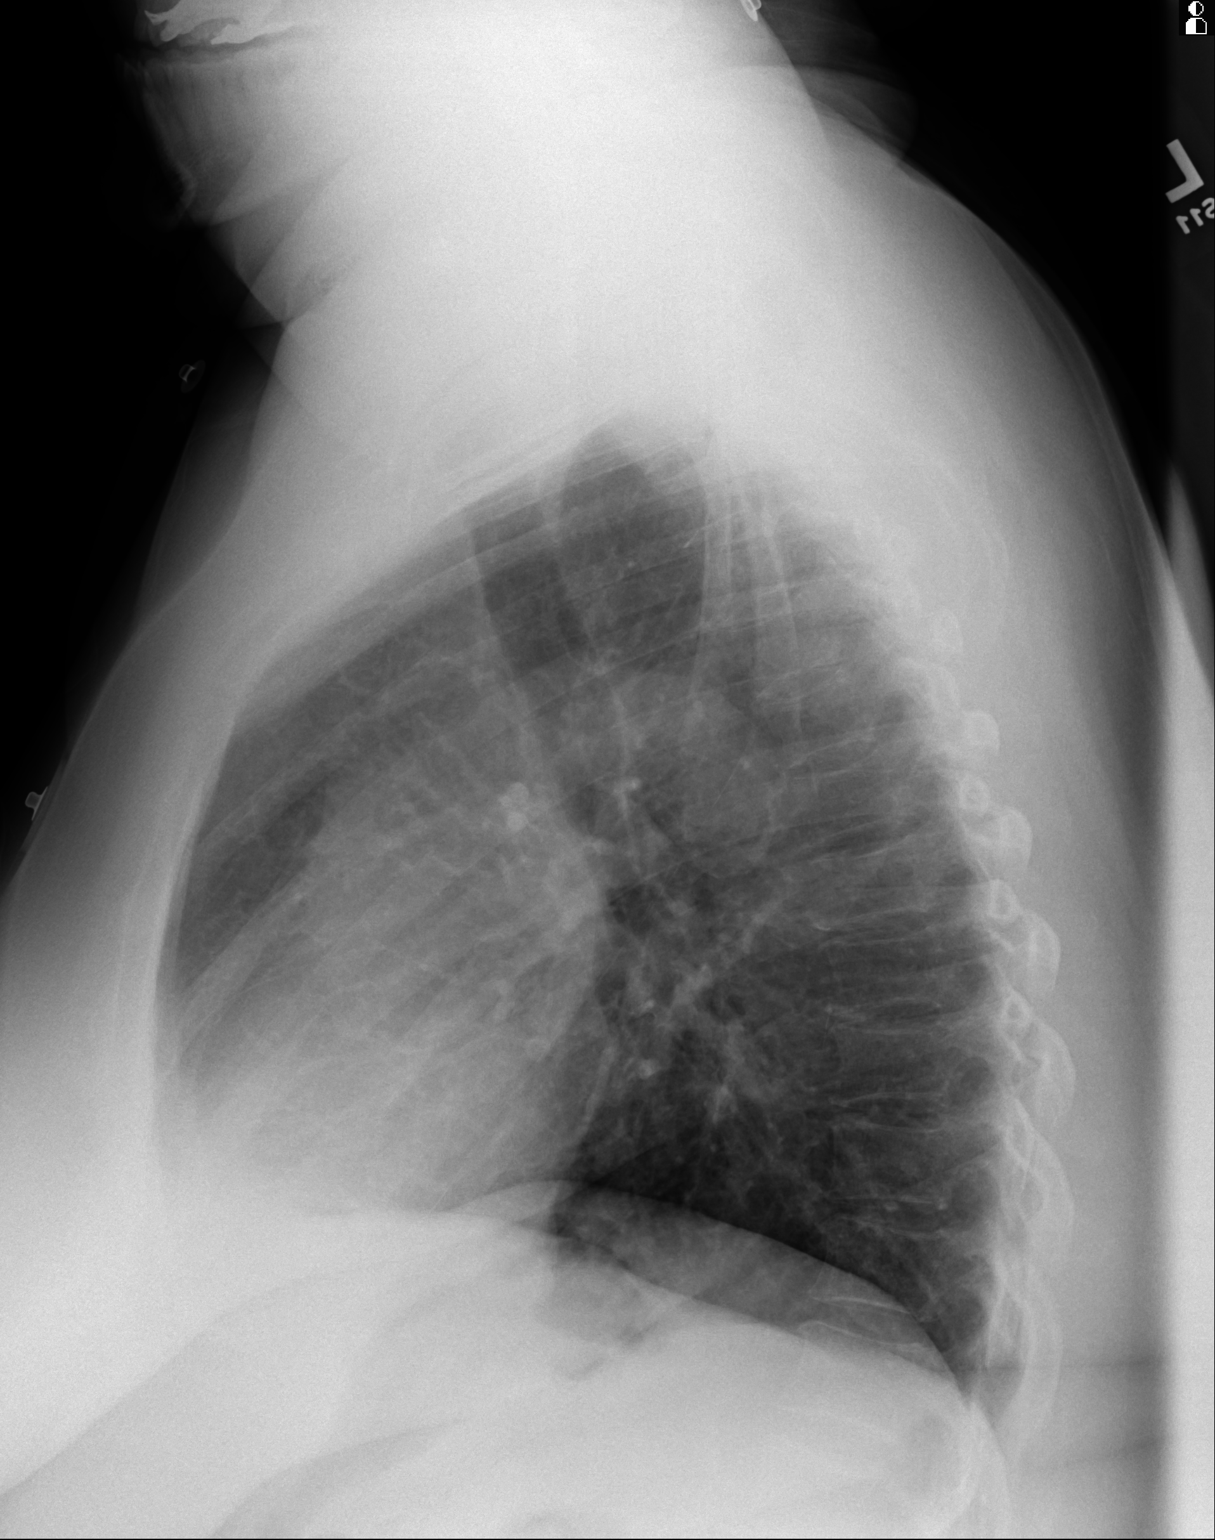

[2 of 2 positions shown; findings below may reference images not displayed]

IMPRESSION: 1. Chest radiograph without evidence of acute cardiopulmonary disease.

2. Comparison made to prior study dated 07/28/2011.

## 2013-08-19 IMAGING — CT CT CHEST-ABD-PELV W/ CM
1 of 2 series · 14 of 32 positions shown, 19 images · non-contrast
Comparison: none

REASON FOR EXAM: (1) trouble breathing and abd pain; (2) trouble
breathing and abdominal pain
COMMENTS:

[Series 6: abdomen · axial · 0.75mm/px · z∈[-1088,-668]mm · 14 of 96 slices shown, 19 images]
[im 6/96  soft-tissue]
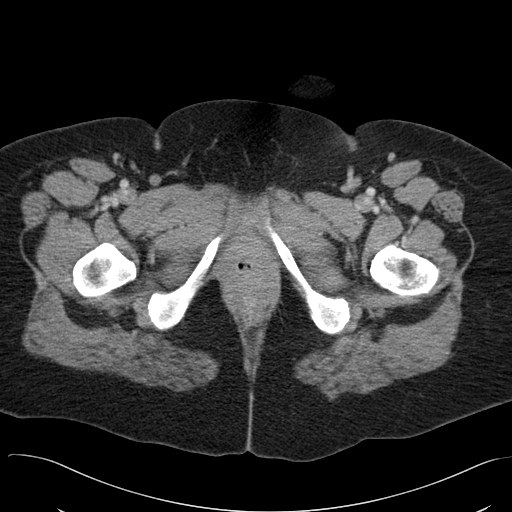
[im 6/96  bone]
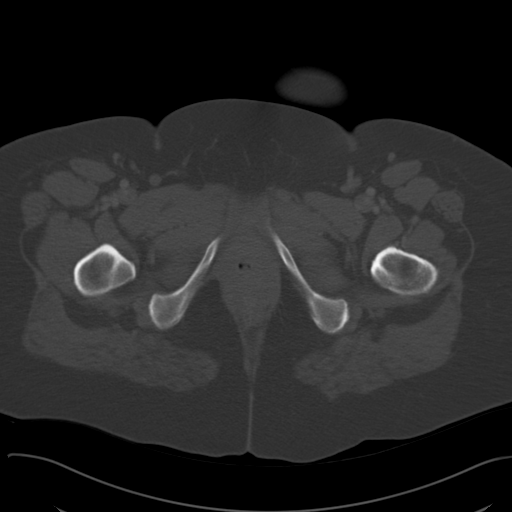
[im 12/96  soft-tissue]
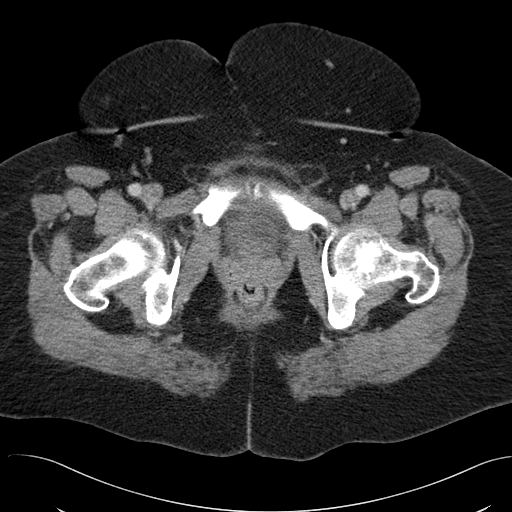
[im 23/96  soft-tissue]
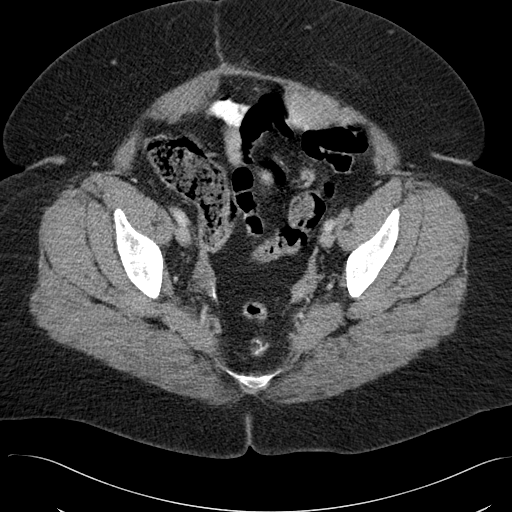
[im 28/96  soft-tissue]
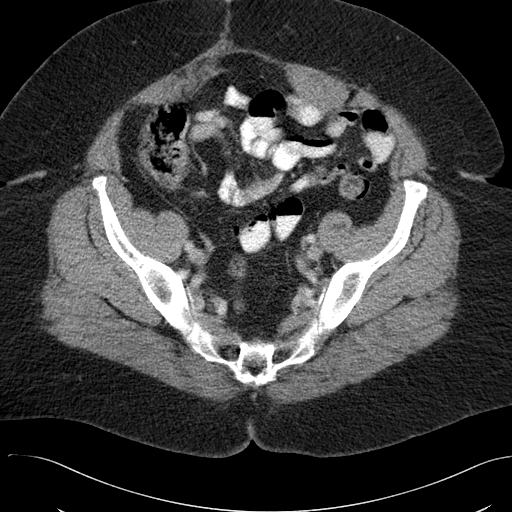
[im 34/96  soft-tissue]
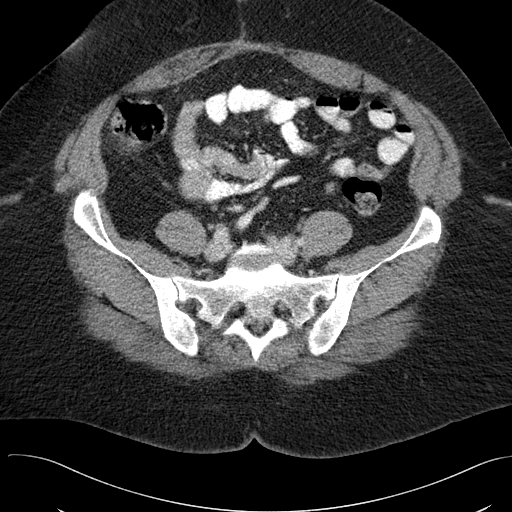
[im 40/96  soft-tissue]
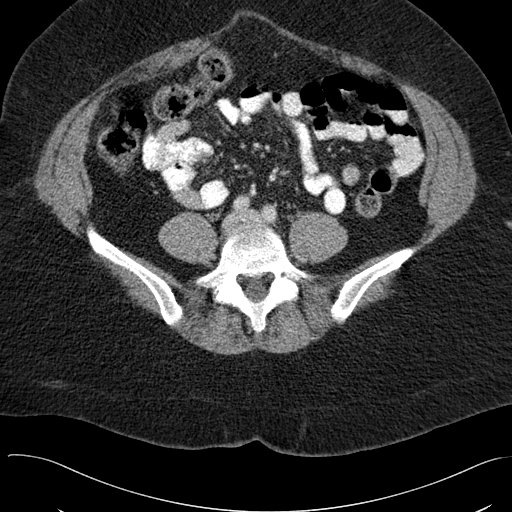
[im 51/96  soft-tissue]
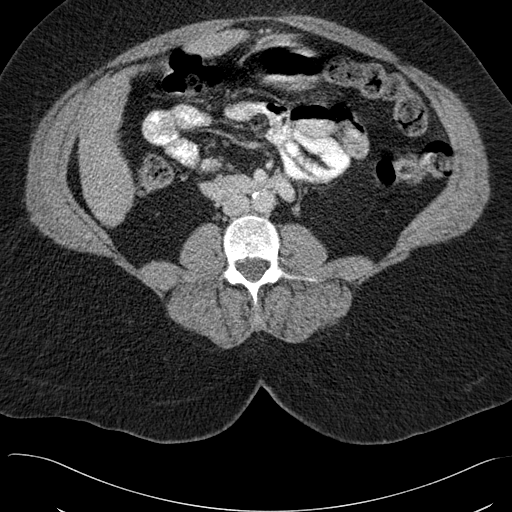
[im 56/96  soft-tissue]
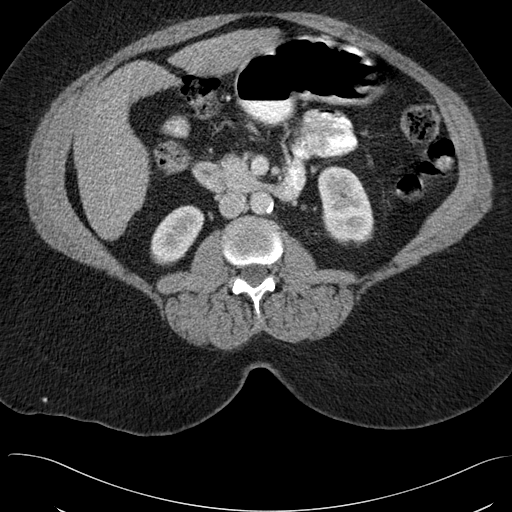
[im 62/96  soft-tissue]
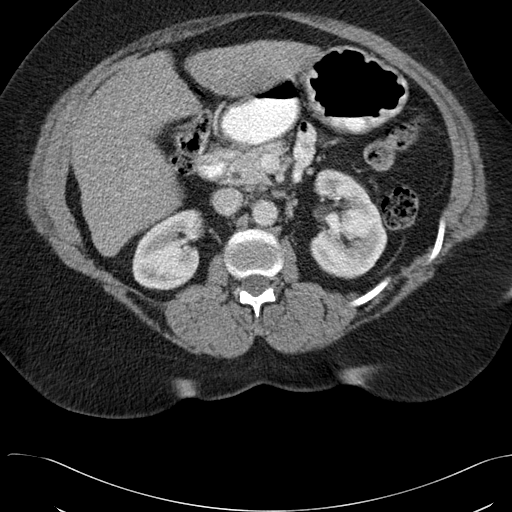
[im 62/96  bone]
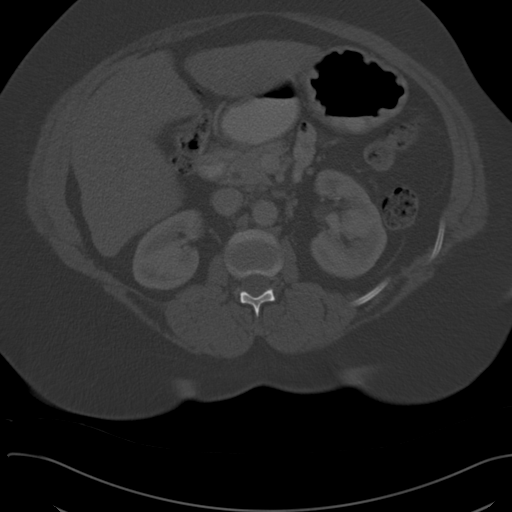
[im 68/96  soft-tissue]
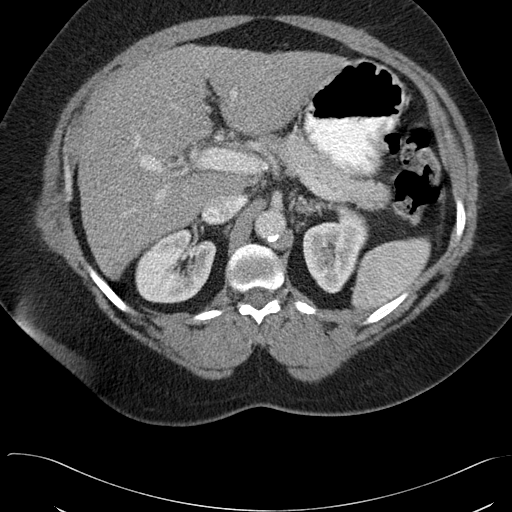
[im 73/96  soft-tissue]
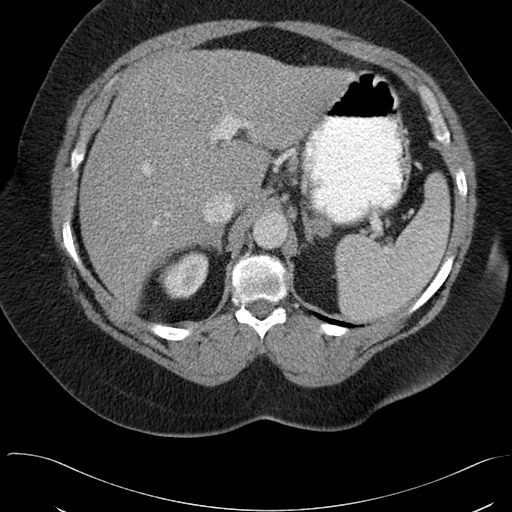
[im 73/96  lung]
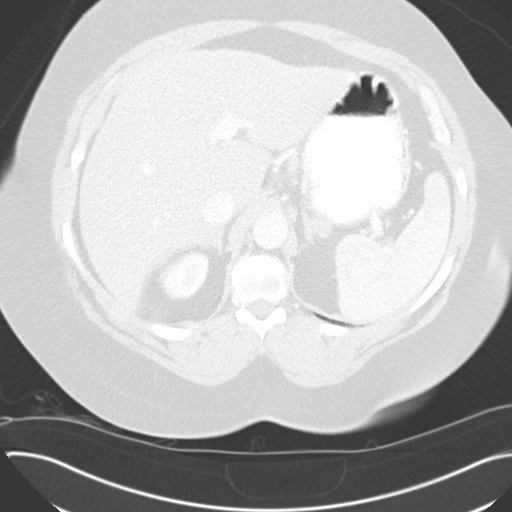
[im 79/96  lung]
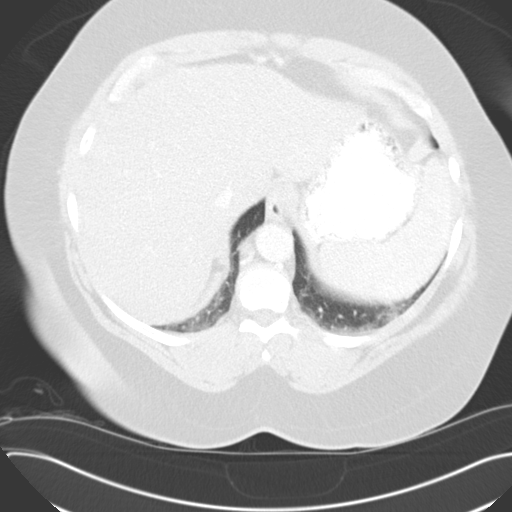
[im 84/96  soft-tissue]
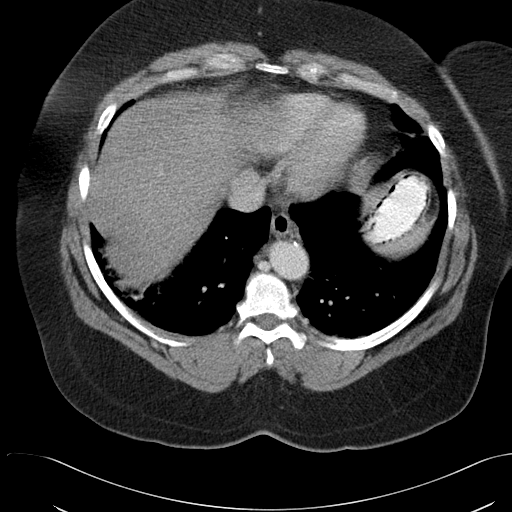
[im 84/96  lung]
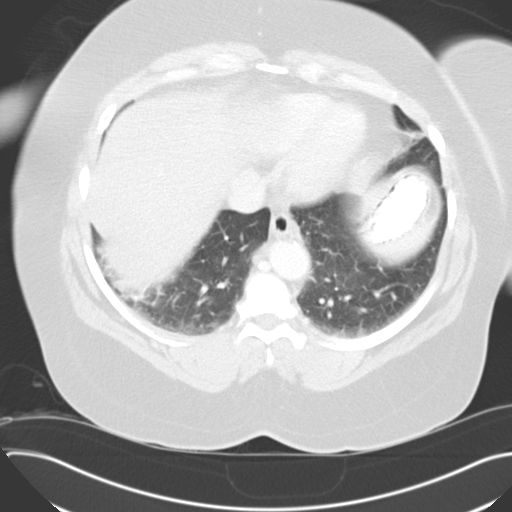
[im 90/96  soft-tissue]
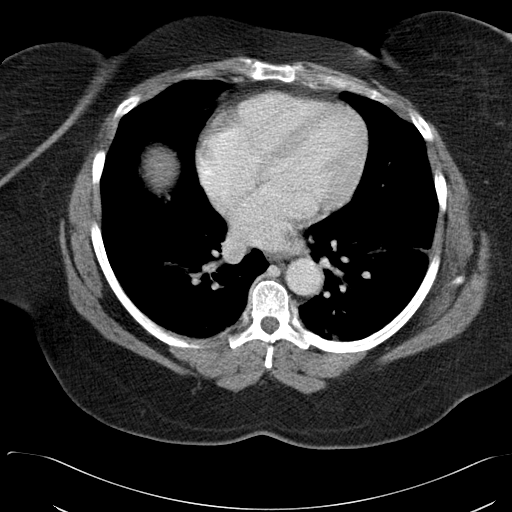
[im 90/96  lung]
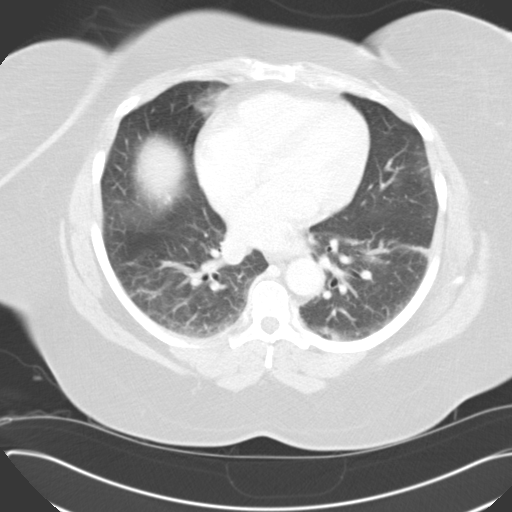

[14 of 32 positions shown; findings below may reference images not displayed]

PROCEDURE:     CT  - CT CHEST ABDOMEN AND PELVIS W  - September 09, 2011  [DATE]

RESULT:     CT of the chest with 100 mL of 2sovue-1LV iodinated intravenous
contrast is followed with CT of the abdomen and pelvis with oral contrast
and the previously mentioned contrast injection. Images are reconstructed at
3.0 mm slice thickness through the chest and 5.0 mm slice thickness through
the abdomen and pelvis. Comparison is made with previous CTs of the abdomen
and pelvis dated 30 January, 2011 and 25 May, 2010.

There is no ascites or abnormal fluid collection within the abdomen or
pelvis. The liver shows no ductal dilation or discrete mass. The spleen,
pancreas and kidneys appear grossly normal. Cholecystectomy clips are
present. The common bile duct is well seen within the pancreatic head
measuring up to 6.4 mm diameter which can be within normal limits in a
postcholecystectomy patient. Atherosclerotic calcification is noted in the
abdominal aorta. No bowel wall thickening or abnormal bowel distention is
present. The abdominal wall appears intact. There is some nodularity on the
left adrenal gland measuring up to 1.9 x 1.3 cm and appearing to be stable
compared to previous exam. The right adrenal gland appears unremarkable.

CT of the chest shows the aortic arch is normal in caliber without
dissection. The pulmonary arteries opacified without filling defect to
suggest pulmonary embolism. There is no mediastinal or hilar mass or
adenopathy. No pleural or pericardial effusion is evident. The bilateral
lower lobe dependent atelectasis is present. Infiltrate is felt to be
unlikely. No air bronchograms are seen. There is no discrete pulmonary
parenchymal mass evident.
IMPRESSION: 1. No acute abdominal or pelvic abnormality. Cholecystectomy clips are
present. Stable left adrenal nodule.
2. No pulmonary embolism, thoracic aortic aneurysm or thoracic aortic
dissection. Dependent atelectasis noted in both lower lobes.

## 2013-08-27 ENCOUNTER — Inpatient Hospital Stay: Payer: Self-pay | Admitting: Internal Medicine

## 2013-08-27 LAB — URINALYSIS, COMPLETE
BLOOD: NEGATIVE
Bilirubin,UR: NEGATIVE
GLUCOSE, UR: NEGATIVE mg/dL (ref 0–75)
Ketone: NEGATIVE
Leukocyte Esterase: NEGATIVE
NITRITE: POSITIVE
PROTEIN: NEGATIVE
Ph: 5 (ref 4.5–8.0)
Specific Gravity: 1.013 (ref 1.003–1.030)
Squamous Epithelial: 1
WBC UR: 2 /HPF (ref 0–5)

## 2013-08-27 LAB — COMPREHENSIVE METABOLIC PANEL
ANION GAP: 7 (ref 7–16)
AST: 66 U/L — AB (ref 15–37)
Albumin: 3.1 g/dL — ABNORMAL LOW (ref 3.4–5.0)
Alkaline Phosphatase: 194 U/L — ABNORMAL HIGH
BILIRUBIN TOTAL: 0.8 mg/dL (ref 0.2–1.0)
BUN: 9 mg/dL (ref 7–18)
CALCIUM: 9.1 mg/dL (ref 8.5–10.1)
CO2: 26 mmol/L (ref 21–32)
CREATININE: 0.97 mg/dL (ref 0.60–1.30)
Chloride: 107 mmol/L (ref 98–107)
Glucose: 150 mg/dL — ABNORMAL HIGH (ref 65–99)
OSMOLALITY: 281 (ref 275–301)
POTASSIUM: 3.6 mmol/L (ref 3.5–5.1)
SGPT (ALT): 44 U/L (ref 12–78)
Sodium: 140 mmol/L (ref 136–145)
Total Protein: 8.9 g/dL — ABNORMAL HIGH (ref 6.4–8.2)

## 2013-08-27 LAB — DRUG SCREEN, URINE
Amphetamines, Ur Screen: NEGATIVE (ref ?–1000)
Barbiturates, Ur Screen: NEGATIVE (ref ?–200)
Benzodiazepine, Ur Scrn: POSITIVE (ref ?–200)
COCAINE METABOLITE, UR ~~LOC~~: NEGATIVE (ref ?–300)
Cannabinoid 50 Ng, Ur ~~LOC~~: NEGATIVE (ref ?–50)
MDMA (Ecstasy)Ur Screen: NEGATIVE (ref ?–500)
Methadone, Ur Screen: NEGATIVE (ref ?–300)
OPIATE, UR SCREEN: NEGATIVE (ref ?–300)
Phencyclidine (PCP) Ur S: NEGATIVE (ref ?–25)
TRICYCLIC, UR SCREEN: POSITIVE (ref ?–1000)

## 2013-08-27 LAB — CBC
HCT: 36.4 % (ref 35.0–47.0)
HGB: 11.7 g/dL — AB (ref 12.0–16.0)
MCH: 26.2 pg (ref 26.0–34.0)
MCHC: 32.1 g/dL (ref 32.0–36.0)
MCV: 82 fL (ref 80–100)
PLATELETS: 155 10*3/uL (ref 150–440)
RBC: 4.46 10*6/uL (ref 3.80–5.20)
RDW: 18.2 % — ABNORMAL HIGH (ref 11.5–14.5)
WBC: 6.4 10*3/uL (ref 3.6–11.0)

## 2013-08-27 LAB — ETHANOL: Ethanol: <3 mg/dL

## 2013-08-27 LAB — TROPONIN I: Troponin-I: <0.02 ng/mL

## 2013-08-27 LAB — LIPASE, BLOOD: Lipase: 146 U/L (ref 73–393)

## 2013-08-27 LAB — RAPID INFLUENZA A&B ANTIGENS (ARMC ONLY)

## 2013-08-27 LAB — AMMONIA: AMMONIA, PLASMA: 64 umol/L — AB (ref 11–32)

## 2013-08-27 LAB — TSH: THYROID STIMULATING HORM: 3.01 u[IU]/mL

## 2013-08-28 LAB — BASIC METABOLIC PANEL
ANION GAP: 7 (ref 7–16)
BUN: 12 mg/dL (ref 7–18)
CALCIUM: 8.8 mg/dL (ref 8.5–10.1)
CO2: 23 mmol/L (ref 21–32)
CREATININE: 0.97 mg/dL (ref 0.60–1.30)
Chloride: 109 mmol/L — ABNORMAL HIGH (ref 98–107)
EGFR (African American): >60
EGFR (Non-African Amer.): >60
GLUCOSE: 158 mg/dL — AB (ref 65–99)
Osmolality: 281 (ref 275–301)
Potassium: 3.8 mmol/L (ref 3.5–5.1)
SODIUM: 139 mmol/L (ref 136–145)

## 2013-08-28 LAB — CBC WITH DIFFERENTIAL/PLATELET
Basophil #: 0 10*3/uL (ref 0.0–0.1)
Basophil %: 0.4 %
Eosinophil #: 0 10*3/uL (ref 0.0–0.7)
Eosinophil %: 0 %
HCT: 33.9 % — ABNORMAL LOW (ref 35.0–47.0)
HGB: 10.5 g/dL — AB (ref 12.0–16.0)
Lymphocyte #: 1.4 10*3/uL (ref 1.0–3.6)
Lymphocyte %: 23.7 %
MCH: 25.2 pg — ABNORMAL LOW (ref 26.0–34.0)
MCHC: 31 g/dL — AB (ref 32.0–36.0)
MCV: 81 fL (ref 80–100)
MONOS PCT: 1.8 %
Monocyte #: 0.1 x10 3/mm — ABNORMAL LOW (ref 0.2–0.9)
Neutrophil #: 4.3 10*3/uL (ref 1.4–6.5)
Neutrophil %: 74.1 %
Platelet: 153 10*3/uL (ref 150–440)
RBC: 4.16 10*6/uL (ref 3.80–5.20)
RDW: 18.2 % — AB (ref 11.5–14.5)
WBC: 5.8 10*3/uL (ref 3.6–11.0)

## 2013-08-29 LAB — URINE CULTURE

## 2013-08-29 LAB — LIPASE, BLOOD: Lipase: 125 U/L (ref 73–393)

## 2013-08-30 LAB — CBC WITH DIFFERENTIAL/PLATELET
BASOS ABS: 0.1 10*3/uL (ref 0.0–0.1)
Basophil %: 0.4 %
EOS ABS: 0 10*3/uL (ref 0.0–0.7)
Eosinophil %: 0 %
HCT: 33.4 % — AB (ref 35.0–47.0)
HGB: 10.6 g/dL — ABNORMAL LOW (ref 12.0–16.0)
Lymphocyte #: 3.3 10*3/uL (ref 1.0–3.6)
Lymphocyte %: 28 %
MCH: 25.6 pg — ABNORMAL LOW (ref 26.0–34.0)
MCHC: 31.6 g/dL — ABNORMAL LOW (ref 32.0–36.0)
MCV: 81 fL (ref 80–100)
Monocyte #: 1 x10 3/mm — ABNORMAL HIGH (ref 0.2–0.9)
Monocyte %: 8.3 %
Neutrophil #: 7.5 10*3/uL — ABNORMAL HIGH (ref 1.4–6.5)
Neutrophil %: 63.3 %
PLATELETS: 158 10*3/uL (ref 150–440)
RBC: 4.14 10*6/uL (ref 3.80–5.20)
RDW: 18.3 % — AB (ref 11.5–14.5)
WBC: 11.9 10*3/uL — ABNORMAL HIGH (ref 3.6–11.0)

## 2013-08-30 LAB — COMPREHENSIVE METABOLIC PANEL
ANION GAP: 6 — AB (ref 7–16)
AST: 38 U/L — AB (ref 15–37)
Albumin: 3.1 g/dL — ABNORMAL LOW (ref 3.4–5.0)
Alkaline Phosphatase: 157 U/L — ABNORMAL HIGH
BUN: 14 mg/dL (ref 7–18)
Bilirubin,Total: 0.6 mg/dL (ref 0.2–1.0)
CALCIUM: 8.8 mg/dL (ref 8.5–10.1)
CREATININE: 1.05 mg/dL (ref 0.60–1.30)
Chloride: 107 mmol/L (ref 98–107)
Co2: 27 mmol/L (ref 21–32)
EGFR (Non-African Amer.): 59 — ABNORMAL LOW
Glucose: 178 mg/dL — ABNORMAL HIGH (ref 65–99)
OSMOLALITY: 284 (ref 275–301)
Potassium: 3.7 mmol/L (ref 3.5–5.1)
SGPT (ALT): 37 U/L (ref 12–78)
SODIUM: 140 mmol/L (ref 136–145)
Total Protein: 8.2 g/dL (ref 6.4–8.2)

## 2013-08-30 LAB — AMMONIA: Ammonia, Plasma: 57 mcmol/L — ABNORMAL HIGH (ref 11–32)

## 2013-09-01 LAB — CULTURE, BLOOD (SINGLE)

## 2013-09-02 ENCOUNTER — Emergency Department: Payer: Self-pay | Admitting: Emergency Medicine

## 2013-09-02 ENCOUNTER — Encounter: Payer: Self-pay | Admitting: Podiatry

## 2013-09-02 LAB — LIPASE, BLOOD: Lipase: 149 U/L (ref 73–393)

## 2013-09-02 LAB — COMPREHENSIVE METABOLIC PANEL
ALBUMIN: 2.9 g/dL — AB (ref 3.4–5.0)
ALK PHOS: 184 U/L — AB
ANION GAP: 5 — AB (ref 7–16)
BUN: 6 mg/dL — ABNORMAL LOW (ref 7–18)
Bilirubin,Total: 0.8 mg/dL (ref 0.2–1.0)
Calcium, Total: 8.8 mg/dL (ref 8.5–10.1)
Chloride: 103 mmol/L (ref 98–107)
Co2: 30 mmol/L (ref 21–32)
Creatinine: 0.95 mg/dL (ref 0.60–1.30)
GLUCOSE: 168 mg/dL — AB (ref 65–99)
OSMOLALITY: 277 (ref 275–301)
Potassium: 3.2 mmol/L — ABNORMAL LOW (ref 3.5–5.1)
SGOT(AST): 63 U/L — ABNORMAL HIGH (ref 15–37)
SGPT (ALT): 75 U/L (ref 12–78)
Sodium: 138 mmol/L (ref 136–145)
TOTAL PROTEIN: 7.4 g/dL (ref 6.4–8.2)

## 2013-09-02 LAB — URINALYSIS, COMPLETE
Bacteria: NONE SEEN
Bilirubin,UR: NEGATIVE
Glucose,UR: NEGATIVE mg/dL (ref 0–75)
Ketone: NEGATIVE
Nitrite: NEGATIVE
PROTEIN: NEGATIVE
Ph: 6 (ref 4.5–8.0)
RBC,UR: 2 /HPF (ref 0–5)
SPECIFIC GRAVITY: 1.01 (ref 1.003–1.030)
WBC UR: 5 /HPF (ref 0–5)

## 2013-09-02 LAB — PROTIME-INR
INR: 1.3
Prothrombin Time: 15.9 secs — ABNORMAL HIGH (ref 11.5–14.7)

## 2013-09-02 LAB — CBC
HCT: 34.1 % — ABNORMAL LOW (ref 35.0–47.0)
HGB: 10.6 g/dL — ABNORMAL LOW (ref 12.0–16.0)
MCH: 25.1 pg — ABNORMAL LOW (ref 26.0–34.0)
MCHC: 31.1 g/dL — AB (ref 32.0–36.0)
MCV: 81 fL (ref 80–100)
PLATELETS: 163 10*3/uL (ref 150–440)
RBC: 4.23 10*6/uL (ref 3.80–5.20)
RDW: 18 % — AB (ref 11.5–14.5)
WBC: 10.8 10*3/uL (ref 3.6–11.0)

## 2013-09-03 ENCOUNTER — Ambulatory Visit: Payer: Medicare Other | Admitting: Podiatry

## 2013-09-04 LAB — URINE CULTURE

## 2013-09-04 IMAGING — CR DG CHEST 1V PORT
1 series · 1 of 1 positions shown · non-contrast
Comparison: none

REASON FOR EXAM: sob
COMMENTS:

PROCEDURE:     DXR - DXR PORTABLE CHEST SINGLE VIEW  - September 25, 2011  [DATE]
RESULT:     Comparison: 09/09/2011

[portable]
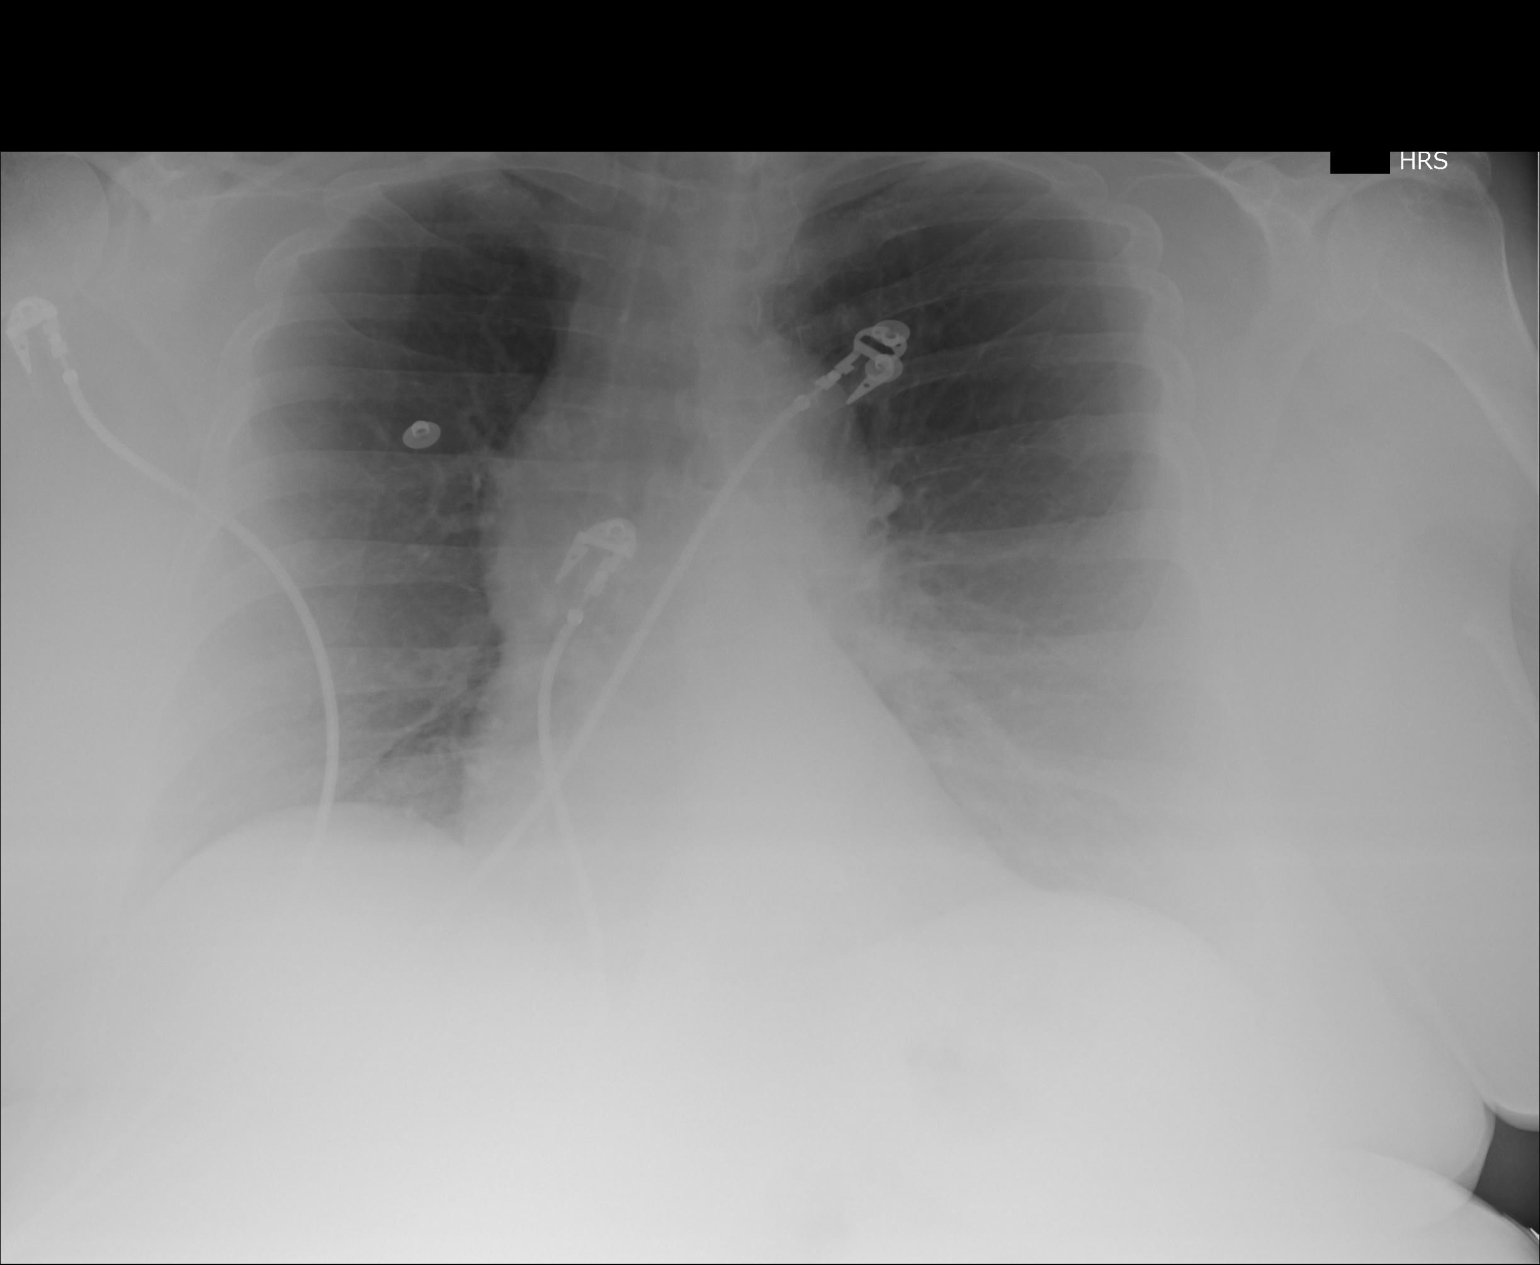

[1 of 1 positions shown; findings below may reference images not displayed]

FINDINGS: Single portable AP chest radiograph is provided.  There is no focal
parenchymal opacity, pleural effusion, or pneumothorax. Normal
cardiomediastinal silhouette. The osseous structures are unremarkable.
IMPRESSION: No acute disease of the che[REDACTED]

## 2013-09-07 LAB — CULTURE, BLOOD (SINGLE)

## 2013-09-13 ENCOUNTER — Inpatient Hospital Stay: Payer: Self-pay | Admitting: Surgery

## 2013-09-13 LAB — CBC WITH DIFFERENTIAL/PLATELET
BASOS ABS: 0 10*3/uL (ref 0.0–0.1)
Basophil %: 0.4 %
Eosinophil #: 0.2 10*3/uL (ref 0.0–0.7)
Eosinophil %: 3.1 %
HCT: 32.5 % — AB (ref 35.0–47.0)
HGB: 10.3 g/dL — AB (ref 12.0–16.0)
Lymphocyte #: 2.7 10*3/uL (ref 1.0–3.6)
Lymphocyte %: 37.1 %
MCH: 25.6 pg — ABNORMAL LOW (ref 26.0–34.0)
MCHC: 31.5 g/dL — ABNORMAL LOW (ref 32.0–36.0)
MCV: 81 fL (ref 80–100)
Monocyte #: 0.7 x10 3/mm (ref 0.2–0.9)
Monocyte %: 9.9 %
NEUTROS ABS: 3.6 10*3/uL (ref 1.4–6.5)
NEUTROS PCT: 49.5 %
Platelet: 134 10*3/uL — ABNORMAL LOW (ref 150–440)
RBC: 4.01 10*6/uL (ref 3.80–5.20)
RDW: 18.6 % — AB (ref 11.5–14.5)
WBC: 7.3 10*3/uL (ref 3.6–11.0)

## 2013-09-13 LAB — URINALYSIS, COMPLETE
Bilirubin,UR: NEGATIVE
Blood: NEGATIVE
GLUCOSE, UR: NEGATIVE mg/dL (ref 0–75)
KETONE: NEGATIVE
NITRITE: NEGATIVE
Ph: 6 (ref 4.5–8.0)
RBC,UR: NONE SEEN /HPF (ref 0–5)
SPECIFIC GRAVITY: 1.013 (ref 1.003–1.030)
Squamous Epithelial: 5
WBC UR: 31 /HPF (ref 0–5)

## 2013-09-13 LAB — COMPREHENSIVE METABOLIC PANEL
ALK PHOS: 186 U/L — AB
ALT: 38 U/L (ref 12–78)
ANION GAP: 2 — AB (ref 7–16)
AST: 56 U/L — AB (ref 15–37)
Albumin: 2.8 g/dL — ABNORMAL LOW (ref 3.4–5.0)
BILIRUBIN TOTAL: 1 mg/dL (ref 0.2–1.0)
BUN: 6 mg/dL — ABNORMAL LOW (ref 7–18)
CALCIUM: 8.6 mg/dL (ref 8.5–10.1)
Chloride: 108 mmol/L — ABNORMAL HIGH (ref 98–107)
Co2: 28 mmol/L (ref 21–32)
Creatinine: 0.84 mg/dL (ref 0.60–1.30)
GLUCOSE: 143 mg/dL — AB (ref 65–99)
Osmolality: 276 (ref 275–301)
Potassium: 3.6 mmol/L (ref 3.5–5.1)
SODIUM: 138 mmol/L (ref 136–145)
Total Protein: 7.6 g/dL (ref 6.4–8.2)

## 2013-09-13 LAB — DRUG SCREEN, URINE
AMPHETAMINES, UR SCREEN: NEGATIVE (ref ?–1000)
BARBITURATES, UR SCREEN: NEGATIVE (ref ?–200)
Benzodiazepine, Ur Scrn: NEGATIVE (ref ?–200)
Cannabinoid 50 Ng, Ur ~~LOC~~: NEGATIVE (ref ?–50)
Cocaine Metabolite,Ur ~~LOC~~: NEGATIVE (ref ?–300)
MDMA (ECSTASY) UR SCREEN: NEGATIVE (ref ?–500)
Methadone, Ur Screen: NEGATIVE (ref ?–300)
Opiate, Ur Screen: NEGATIVE (ref ?–300)
PHENCYCLIDINE (PCP) UR S: NEGATIVE (ref ?–25)
Tricyclic, Ur Screen: POSITIVE (ref ?–1000)

## 2013-09-13 LAB — ETHANOL: Ethanol %: <0.003 % (ref 0.000–0.080)

## 2013-09-13 LAB — MAGNESIUM: Magnesium: 1.8 mg/dL

## 2013-09-13 LAB — PROTIME-INR
INR: 1.4
Prothrombin Time: 17.2 secs — ABNORMAL HIGH (ref 11.5–14.7)

## 2013-09-13 LAB — AMYLASE: AMYLASE: 31 U/L (ref 25–115)

## 2013-09-13 LAB — AMMONIA: AMMONIA, PLASMA: 84 umol/L — AB (ref 11–32)

## 2013-09-13 LAB — APTT: ACTIVATED PTT: 32.6 s (ref 23.6–35.9)

## 2013-09-14 LAB — BASIC METABOLIC PANEL
Anion Gap: 6 — ABNORMAL LOW (ref 7–16)
BUN: 5 mg/dL — ABNORMAL LOW (ref 7–18)
CREATININE: 0.88 mg/dL (ref 0.60–1.30)
Calcium, Total: 8.4 mg/dL — ABNORMAL LOW (ref 8.5–10.1)
Chloride: 110 mmol/L — ABNORMAL HIGH (ref 98–107)
Co2: 26 mmol/L (ref 21–32)
EGFR (African American): >60
EGFR (Non-African Amer.): >60
Glucose: 123 mg/dL — ABNORMAL HIGH (ref 65–99)
Osmolality: 282 (ref 275–301)
POTASSIUM: 3.6 mmol/L (ref 3.5–5.1)
Sodium: 142 mmol/L (ref 136–145)

## 2013-09-14 LAB — CBC WITH DIFFERENTIAL/PLATELET
Basophil #: 0 10*3/uL (ref 0.0–0.1)
Basophil %: 0.6 %
EOS ABS: 0.3 10*3/uL (ref 0.0–0.7)
Eosinophil %: 3.3 %
HCT: 33.7 % — ABNORMAL LOW (ref 35.0–47.0)
HGB: 10.4 g/dL — ABNORMAL LOW (ref 12.0–16.0)
LYMPHS PCT: 36.7 %
Lymphocyte #: 2.8 10*3/uL (ref 1.0–3.6)
MCH: 25.3 pg — AB (ref 26.0–34.0)
MCHC: 30.9 g/dL — ABNORMAL LOW (ref 32.0–36.0)
MCV: 82 fL (ref 80–100)
Monocyte #: 0.7 x10 3/mm (ref 0.2–0.9)
Monocyte %: 8.5 %
NEUTROS ABS: 3.9 10*3/uL (ref 1.4–6.5)
Neutrophil %: 50.9 %
Platelet: 148 10*3/uL — ABNORMAL LOW (ref 150–440)
RBC: 4.11 10*6/uL (ref 3.80–5.20)
RDW: 18.7 % — AB (ref 11.5–14.5)
WBC: 7.7 10*3/uL (ref 3.6–11.0)

## 2013-09-17 LAB — PATHOLOGY REPORT

## 2013-09-17 LAB — URINE CULTURE

## 2013-10-13 IMAGING — CR DG ABDOMEN 3V
1 series · 6 of 6 positions shown · non-contrast
Comparison: none

REASON FOR EXAM: chest pain, abdominal pain, cough
COMMENTS:

PROCEDURE:     DXR - DXR ABDOMEN 3-WAY (INCL PA CXR)  - November 03, 2011  [DATE]
RESULT:     Comparisons:  None

[Series 1: pa · 0.17mm/px · 6 of 6 slices shown]
[im 1/6]
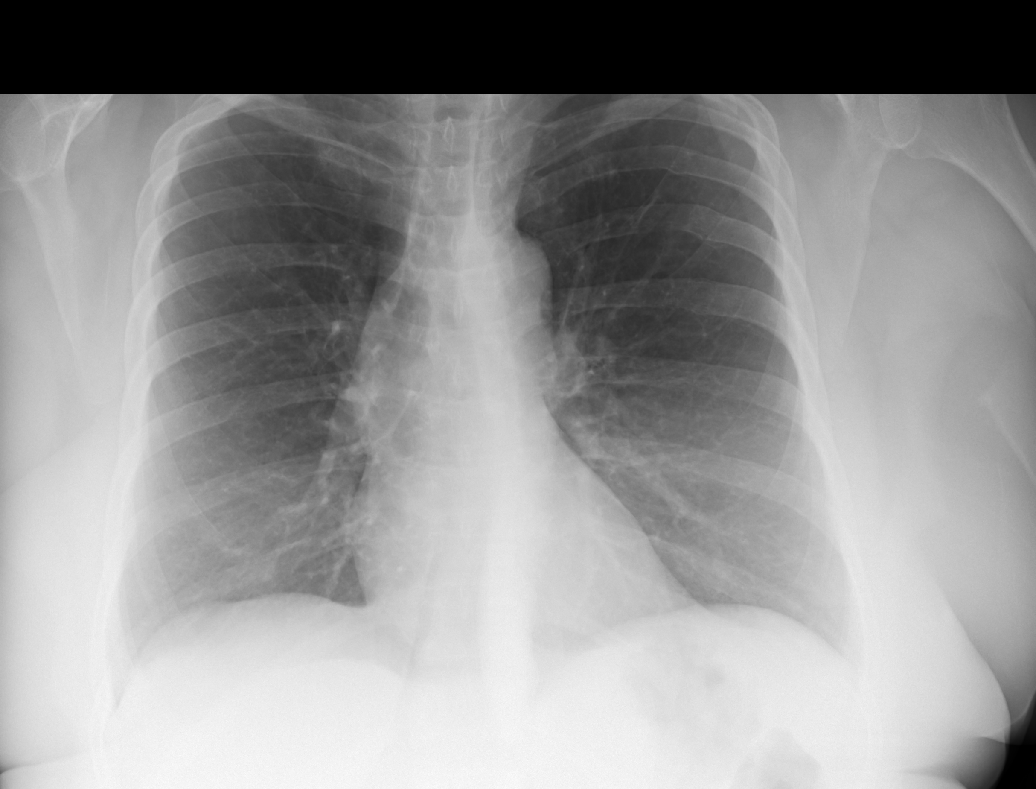
[im 2/6]
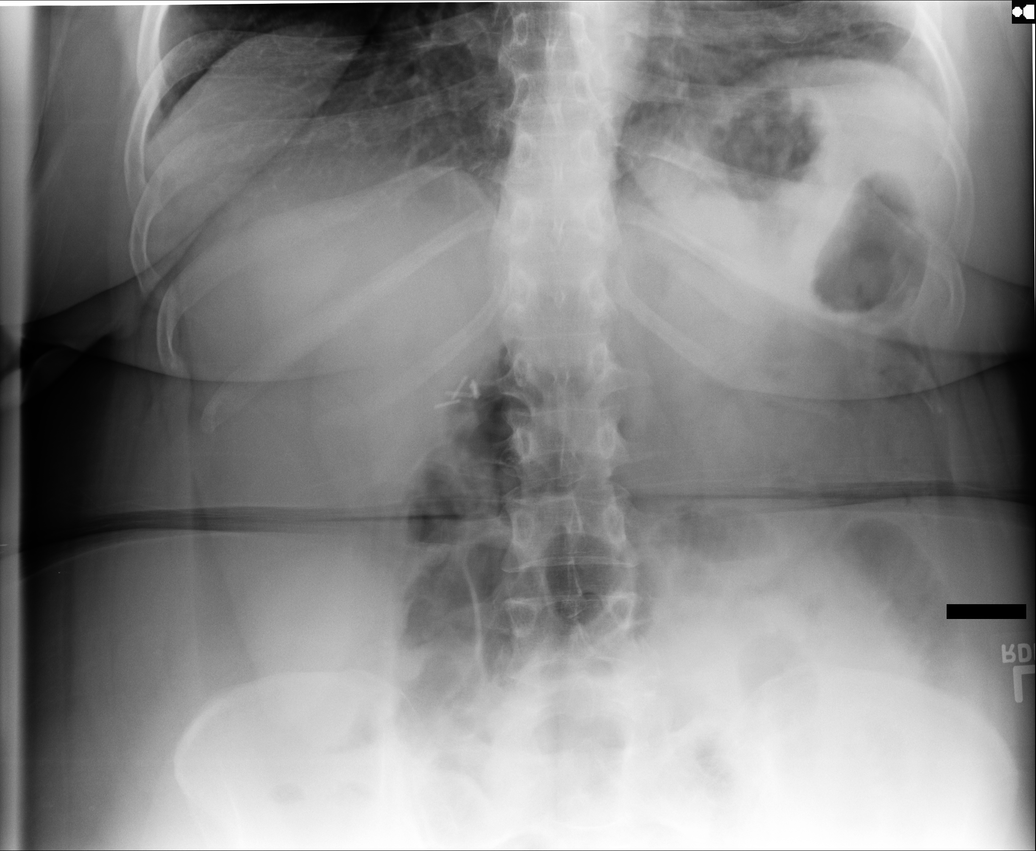
[im 3/6]
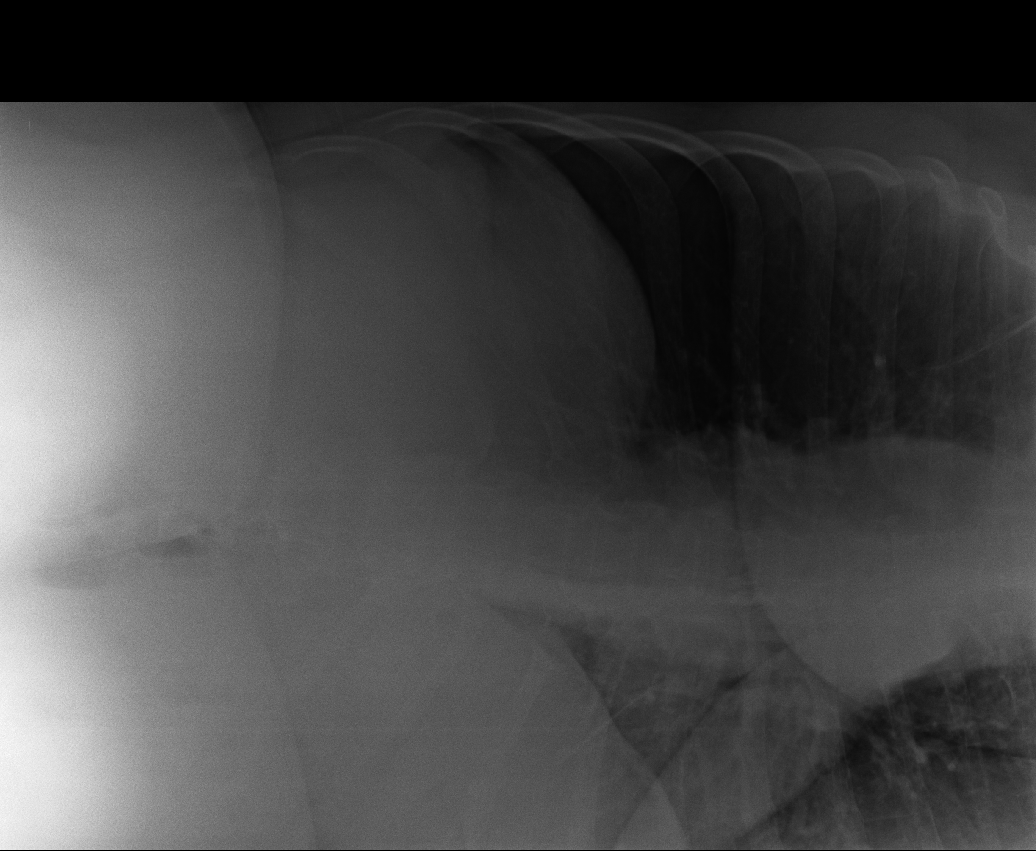
[im 4/6]
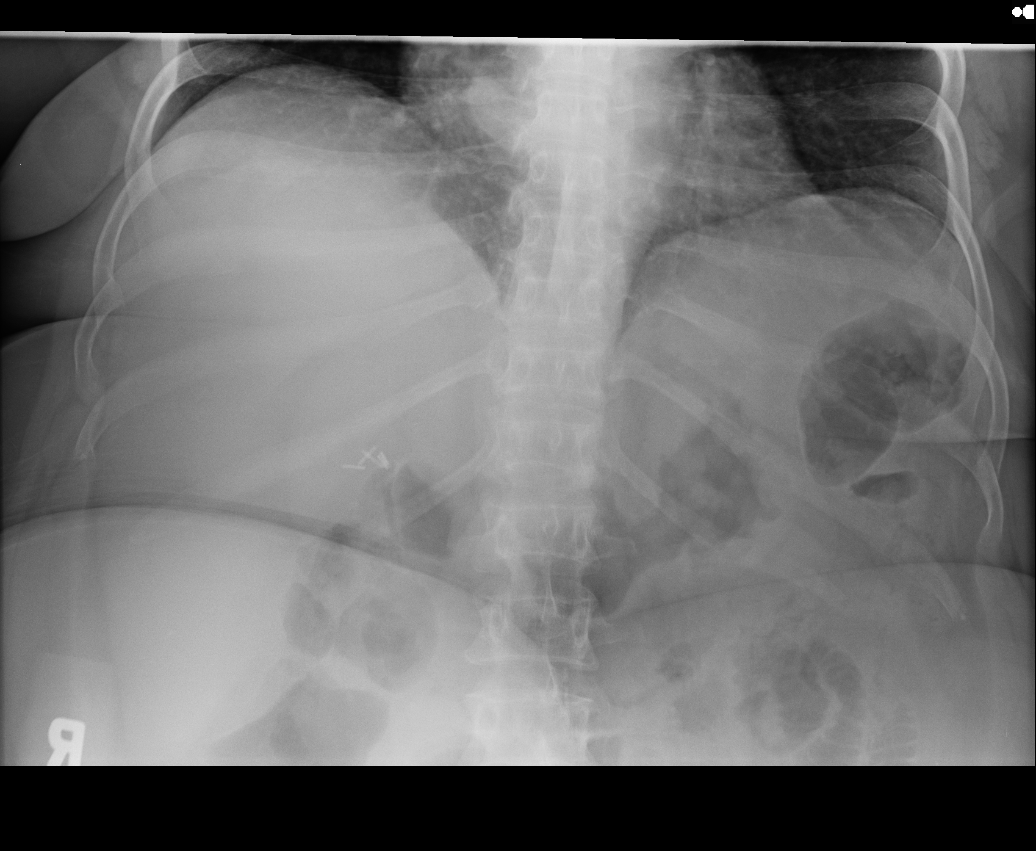
[im 5/6]
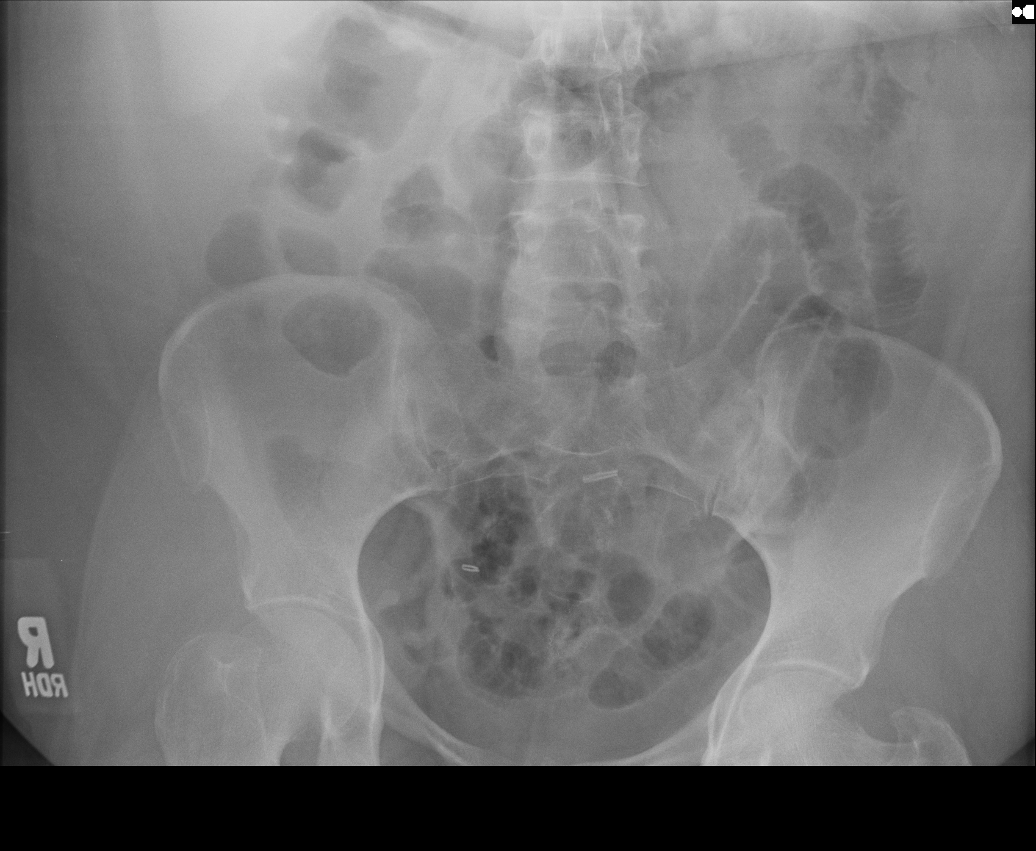
[im 6/6]
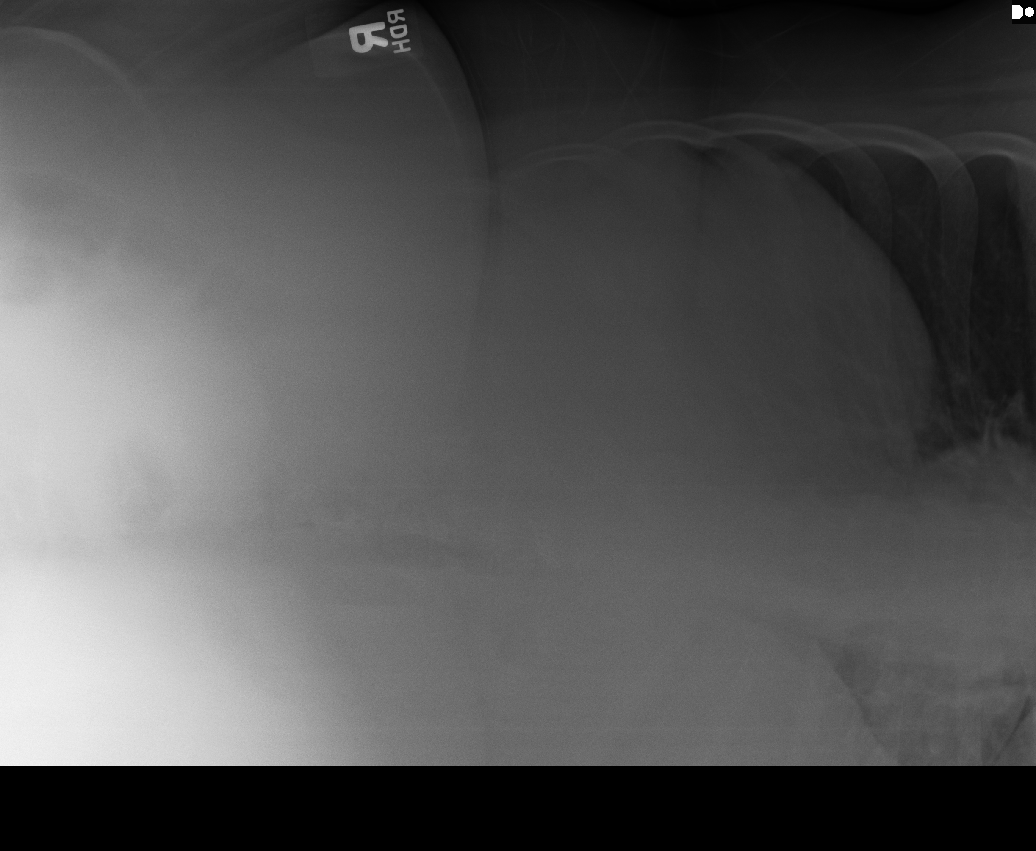

[6 of 6 positions shown; findings below may reference images not displayed]

FINDINGS: PA chest and supine, upright, and left lateral decubitus views of the
abdomen are provided.

The chest is clear. The heart and mediastinum are unremarkable. The osseous
structures are unremarkable.

There is a nonspecific bowel gas pattern. There is no bowel dilatation to
suggest obstruction. There are no air-fluid levels. There are no dilated
loops, bowel wall thickening, or evidence of mass effect.  Soft tissue
shadows are normal. There is no evidence of pneumoperitoneum, portal venous
gas, or pneumatosis.

Osseous structures are unremarkable.
IMPRESSION: Unremarkable abdominal series.

[REDACTED]

## 2013-10-14 IMAGING — US ABDOMEN ULTRASOUND
1 series · 17 of 25 positions shown · non-contrast
Comparison: none

REASON FOR EXAM: elevated LFTs, panceatitis
COMMENTS:

[Series 1: abdomen ultrasound · 17 of 81 slices shown]
[im 1/81]
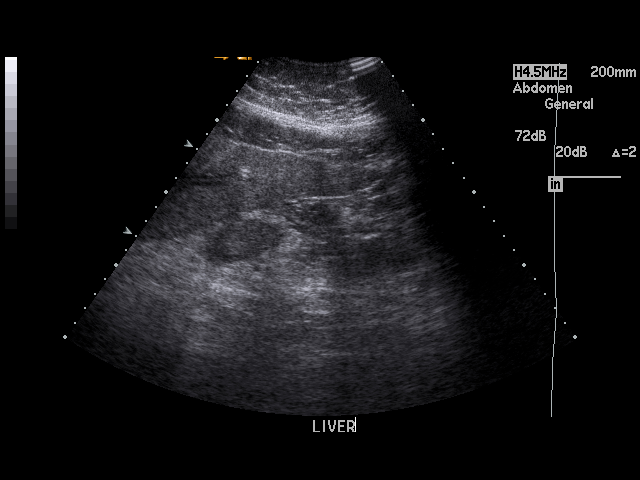
[im 7/81]
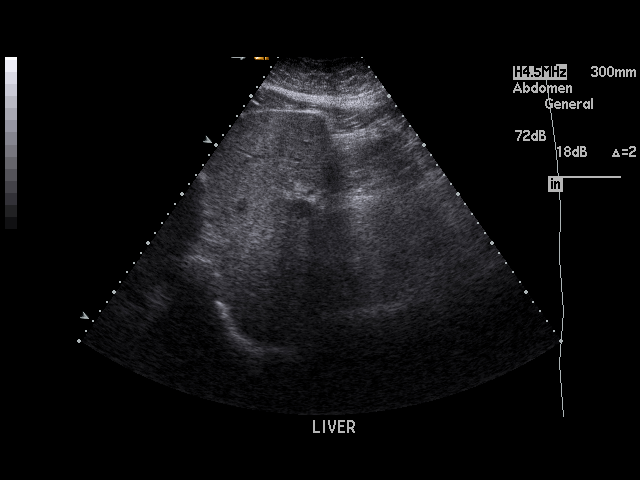
[im 11/81]
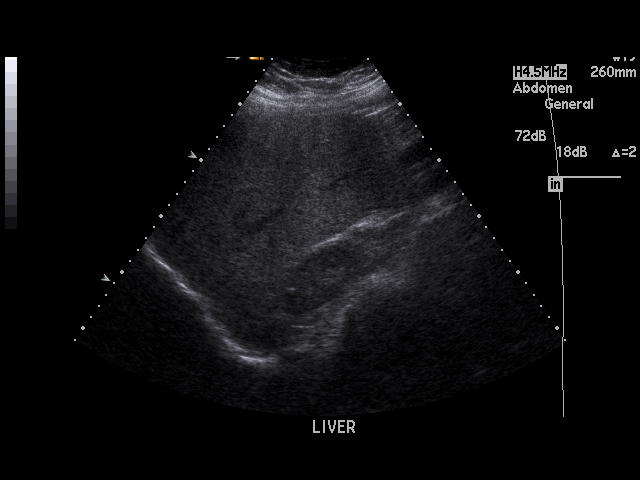
[im 17/81]
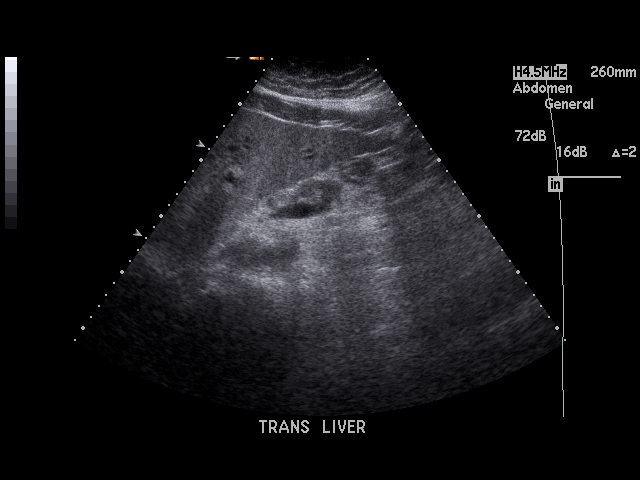
[im 21/81]
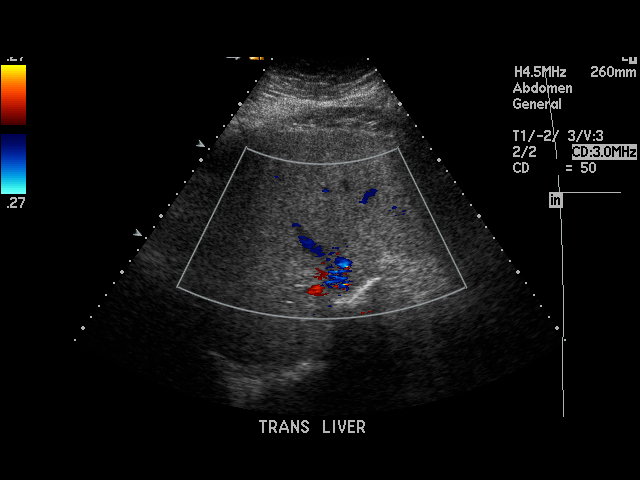
[im 27/81]
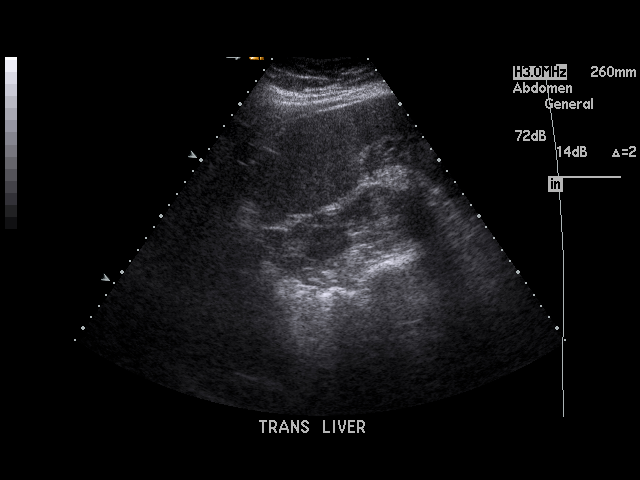
[im 31/81]
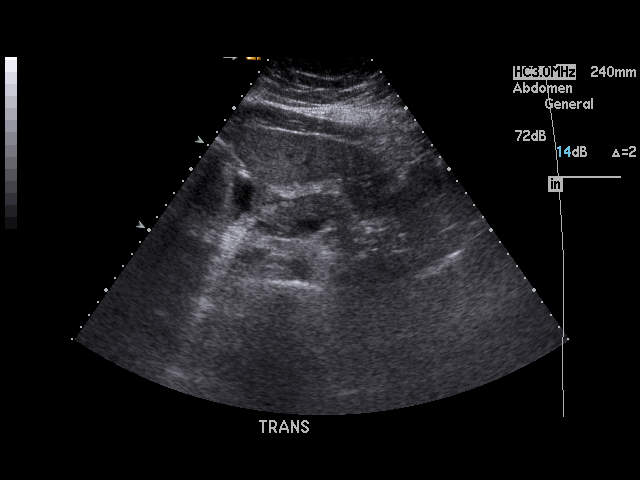
[im 37/81]
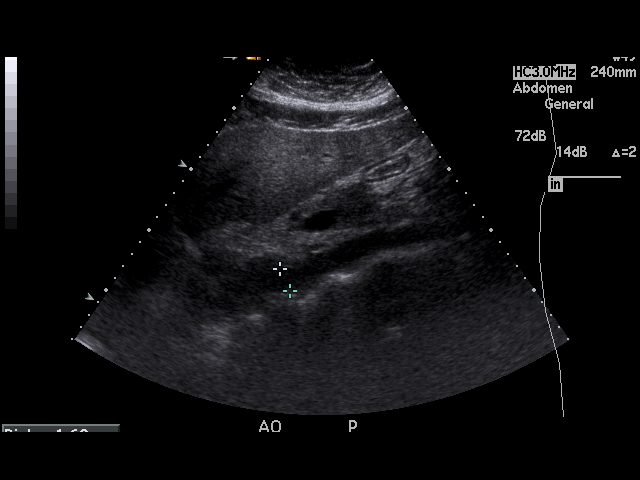
[im 41/81]
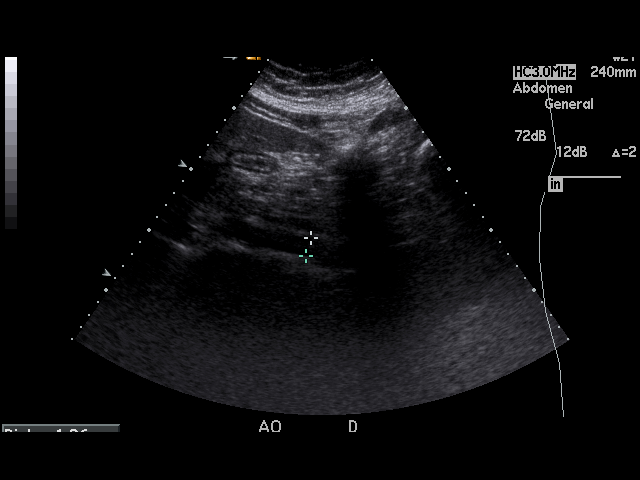
[im 44/81]
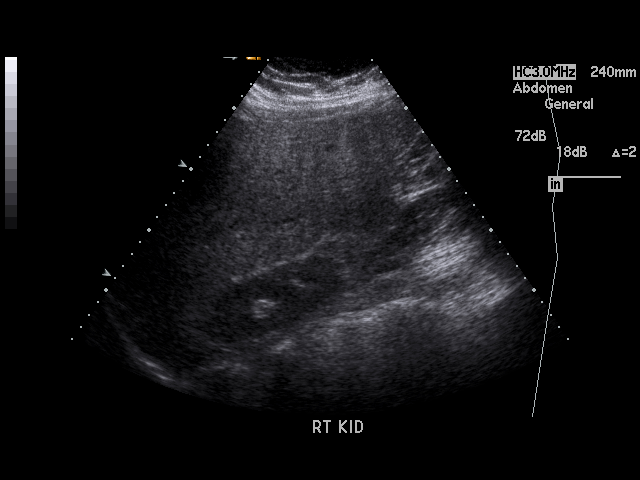
[im 51/81]
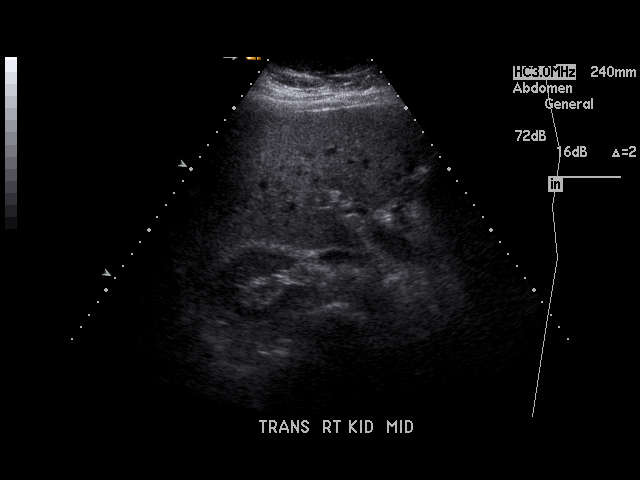
[im 54/81]
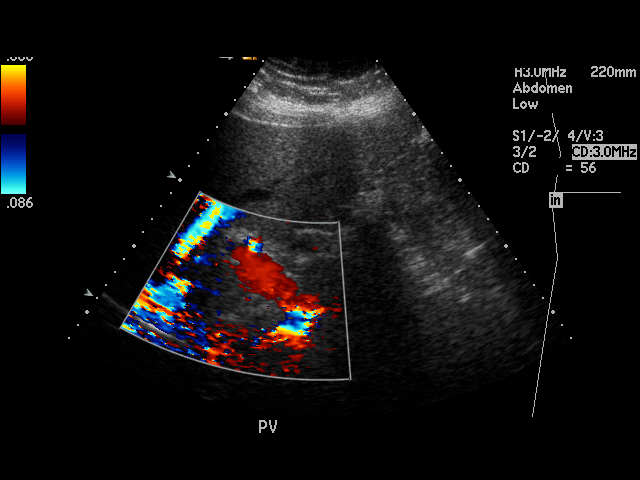
[im 61/81]
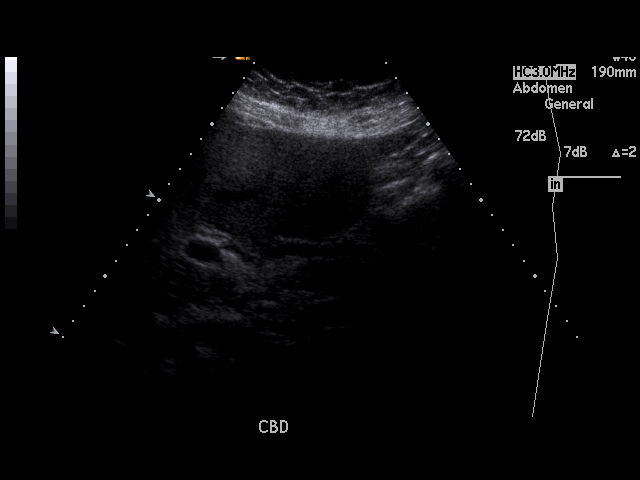
[im 64/81]
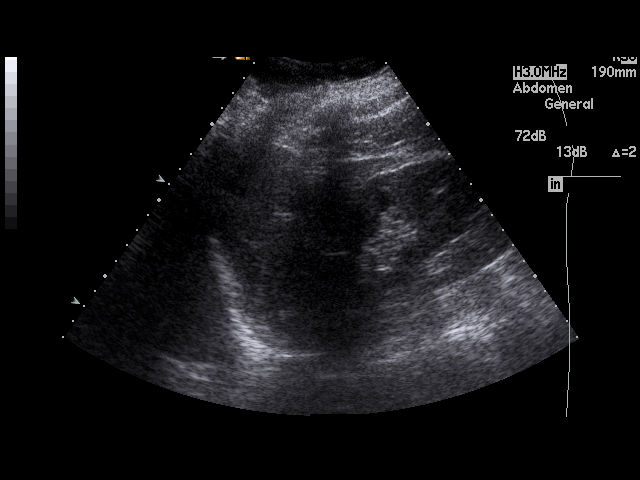
[im 71/81]
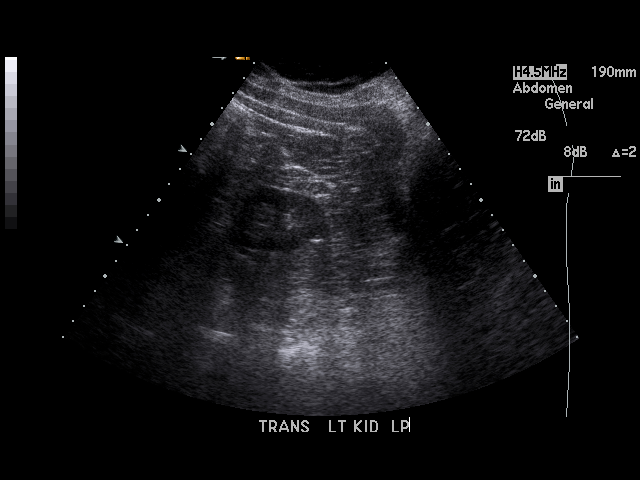
[im 74/81]
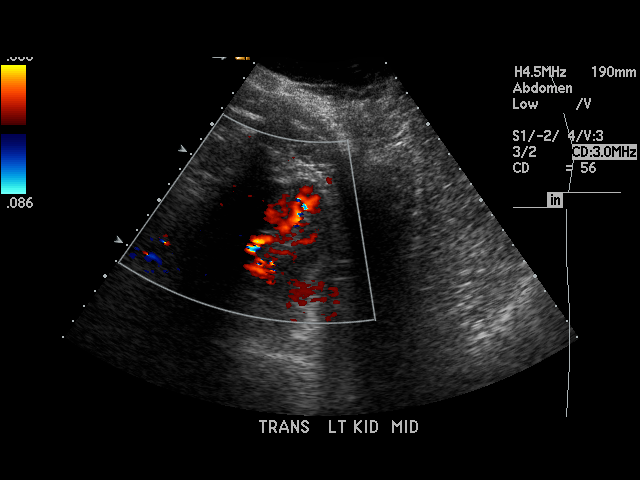
[im 81/81]
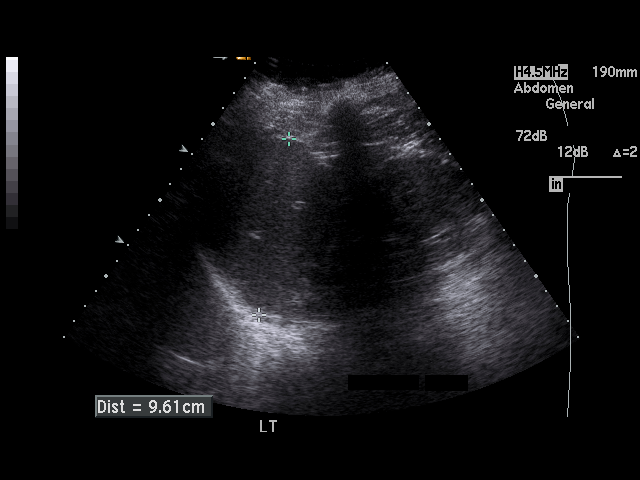

[17 of 25 positions shown; findings below may reference images not displayed]

PROCEDURE:     US  - US ABDOMEN GENERAL SURVEY  - November 04, 2011  [DATE]

RESULT:     The hepatic echo pattern shows no specific abnormalities. The
pancreas is not visualized adequately for evaluation on this exam. Spleen
size is normal. The abdominal aorta and inferior vena cava show no
significant abnormalities. The gallbladder is not seen compatible with prior
cholecystectomy. The common bile duct measures 5.4 mm at maximum diameter
which is within normal limits. The kidneys show no hydronephrosis.
Sagittally, the right kidney measures 12.0 cm and the left measures 12.2 cm.
No ascites is seen.
IMPRESSION: 1. No acute changes are identified.
2. The patient is status post cholecystectomy.
3. The pancreas is not visualized adequately for evaluation on this exam.

## 2013-10-18 ENCOUNTER — Emergency Department: Payer: Self-pay | Admitting: Emergency Medicine

## 2013-10-19 ENCOUNTER — Emergency Department: Payer: Self-pay | Admitting: Emergency Medicine

## 2013-10-23 ENCOUNTER — Emergency Department: Payer: Self-pay | Admitting: Emergency Medicine

## 2013-10-23 LAB — COMPREHENSIVE METABOLIC PANEL
Albumin: 3.3 g/dL — ABNORMAL LOW (ref 3.4–5.0)
Alkaline Phosphatase: 152 U/L — ABNORMAL HIGH
Anion Gap: 6 — ABNORMAL LOW (ref 7–16)
BUN: 7 mg/dL (ref 7–18)
Bilirubin,Total: 0.9 mg/dL (ref 0.2–1.0)
Calcium, Total: 8.7 mg/dL (ref 8.5–10.1)
Chloride: 107 mmol/L (ref 98–107)
Co2: 27 mmol/L (ref 21–32)
Creatinine: 1.09 mg/dL (ref 0.60–1.30)
EGFR (African American): >60
EGFR (Non-African Amer.): 57 — ABNORMAL LOW
Glucose: 147 mg/dL — ABNORMAL HIGH (ref 65–99)
Osmolality: 280 (ref 275–301)
POTASSIUM: 3.6 mmol/L (ref 3.5–5.1)
SGOT(AST): 54 U/L — ABNORMAL HIGH (ref 15–37)
SGPT (ALT): 34 U/L (ref 12–78)
Sodium: 140 mmol/L (ref 136–145)
TOTAL PROTEIN: 8.4 g/dL — AB (ref 6.4–8.2)

## 2013-10-23 LAB — CBC
HCT: 35.2 % (ref 35.0–47.0)
HGB: 10.8 g/dL — ABNORMAL LOW (ref 12.0–16.0)
MCH: 25 pg — AB (ref 26.0–34.0)
MCHC: 30.6 g/dL — AB (ref 32.0–36.0)
MCV: 82 fL (ref 80–100)
PLATELETS: 150 10*3/uL (ref 150–440)
RBC: 4.31 10*6/uL (ref 3.80–5.20)
RDW: 18.3 % — ABNORMAL HIGH (ref 11.5–14.5)
WBC: 5.2 10*3/uL (ref 3.6–11.0)

## 2013-10-23 LAB — LIPASE, BLOOD: LIPASE: 249 U/L (ref 73–393)

## 2013-10-23 LAB — AMMONIA: AMMONIA, PLASMA: 78 umol/L — AB (ref 11–32)

## 2013-11-09 ENCOUNTER — Ambulatory Visit: Payer: Self-pay | Admitting: Gastroenterology

## 2013-11-11 LAB — PATHOLOGY REPORT

## 2013-11-22 ENCOUNTER — Encounter (HOSPITAL_COMMUNITY): Payer: Self-pay | Admitting: Emergency Medicine

## 2013-11-22 ENCOUNTER — Emergency Department (HOSPITAL_COMMUNITY)
Admission: EM | Admit: 2013-11-22 | Discharge: 2013-11-22 | Discharge status: Against Medical Advice | Payer: PRIVATE HEALTH INSURANCE | Attending: Emergency Medicine | Admitting: Emergency Medicine

## 2013-11-22 DIAGNOSIS — I129 Hypertensive chronic kidney disease with stage 1 through stage 4 chronic kidney disease, or unspecified chronic kidney disease: Secondary | ICD-10-CM | POA: Insufficient documentation

## 2013-11-22 DIAGNOSIS — R109 Unspecified abdominal pain: Secondary | ICD-10-CM | POA: Insufficient documentation

## 2013-11-22 DIAGNOSIS — M549 Dorsalgia, unspecified: Secondary | ICD-10-CM | POA: Insufficient documentation

## 2013-11-22 DIAGNOSIS — Z8719 Personal history of other diseases of the digestive system: Secondary | ICD-10-CM | POA: Insufficient documentation

## 2013-11-22 DIAGNOSIS — F17200 Nicotine dependence, unspecified, uncomplicated: Secondary | ICD-10-CM | POA: Insufficient documentation

## 2013-11-22 DIAGNOSIS — J438 Other emphysema: Secondary | ICD-10-CM | POA: Insufficient documentation

## 2013-11-22 DIAGNOSIS — N189 Chronic kidney disease, unspecified: Secondary | ICD-10-CM | POA: Insufficient documentation

## 2013-11-22 HISTORY — DX: Other ascites: R18.8

## 2013-11-22 LAB — URINALYSIS, ROUTINE W REFLEX MICROSCOPIC
BILIRUBIN URINE: NEGATIVE
Glucose, UA: NEGATIVE mg/dL
Hgb urine dipstick: NEGATIVE
KETONES UR: NEGATIVE mg/dL
NITRITE: POSITIVE — AB
Protein, ur: NEGATIVE mg/dL
SPECIFIC GRAVITY, URINE: 1.004 — AB (ref 1.005–1.030)
UROBILINOGEN UA: 1 mg/dL (ref 0.0–1.0)
pH: 6.5 (ref 5.0–8.0)

## 2013-11-22 LAB — URINE MICROSCOPIC-ADD ON

## 2013-11-22 NOTE — ED Notes (Signed)
Pt did not answer x 2 

## 2013-11-22 NOTE — ED Notes (Signed)
Attempted to draw blood specimen, but was unsuccessful. AM

## 2013-11-22 NOTE — ED Notes (Signed)
Pt did not answer when called for x 3

## 2013-11-22 NOTE — ED Notes (Signed)
Called pt for room with no answer 

## 2013-11-22 NOTE — ED Notes (Addendum)
abd pain for months x 6 months hx of pancreatitis and back pain  Was at [facility] x 1 month ago in icu states her pee smells strong

## 2013-11-24 ENCOUNTER — Emergency Department (HOSPITAL_COMMUNITY): Payer: PRIVATE HEALTH INSURANCE

## 2013-11-24 ENCOUNTER — Encounter (HOSPITAL_COMMUNITY): Payer: Self-pay | Admitting: Emergency Medicine

## 2013-11-24 ENCOUNTER — Emergency Department (HOSPITAL_COMMUNITY)
Admission: EM | Admit: 2013-11-24 | Discharge: 2013-11-24 | Disposition: A | Discharge status: Home / Self Care | Payer: PRIVATE HEALTH INSURANCE | Attending: Emergency Medicine | Admitting: Emergency Medicine

## 2013-11-24 DIAGNOSIS — Z9889 Other specified postprocedural states: Secondary | ICD-10-CM | POA: Insufficient documentation

## 2013-11-24 DIAGNOSIS — K219 Gastro-esophageal reflux disease without esophagitis: Secondary | ICD-10-CM | POA: Insufficient documentation

## 2013-11-24 DIAGNOSIS — M545 Low back pain, unspecified: Secondary | ICD-10-CM

## 2013-11-24 DIAGNOSIS — N189 Chronic kidney disease, unspecified: Secondary | ICD-10-CM | POA: Insufficient documentation

## 2013-11-24 DIAGNOSIS — J449 Chronic obstructive pulmonary disease, unspecified: Secondary | ICD-10-CM | POA: Insufficient documentation

## 2013-11-24 DIAGNOSIS — F329 Major depressive disorder, single episode, unspecified: Secondary | ICD-10-CM | POA: Insufficient documentation

## 2013-11-24 DIAGNOSIS — Z79899 Other long term (current) drug therapy: Secondary | ICD-10-CM | POA: Insufficient documentation

## 2013-11-24 DIAGNOSIS — Z3202 Encounter for pregnancy test, result negative: Secondary | ICD-10-CM | POA: Insufficient documentation

## 2013-11-24 DIAGNOSIS — Z880 Allergy status to penicillin: Secondary | ICD-10-CM | POA: Insufficient documentation

## 2013-11-24 DIAGNOSIS — IMO0002 Reserved for concepts with insufficient information to code with codable children: Secondary | ICD-10-CM | POA: Insufficient documentation

## 2013-11-24 DIAGNOSIS — Z8619 Personal history of other infectious and parasitic diseases: Secondary | ICD-10-CM | POA: Insufficient documentation

## 2013-11-24 DIAGNOSIS — Z85841 Personal history of malignant neoplasm of brain: Secondary | ICD-10-CM | POA: Insufficient documentation

## 2013-11-24 DIAGNOSIS — Z8673 Personal history of transient ischemic attack (TIA), and cerebral infarction without residual deficits: Secondary | ICD-10-CM | POA: Insufficient documentation

## 2013-11-24 DIAGNOSIS — R197 Diarrhea, unspecified: Secondary | ICD-10-CM | POA: Insufficient documentation

## 2013-11-24 DIAGNOSIS — F1110 Opioid abuse, uncomplicated: Secondary | ICD-10-CM

## 2013-11-24 DIAGNOSIS — I129 Hypertensive chronic kidney disease with stage 1 through stage 4 chronic kidney disease, or unspecified chronic kidney disease: Secondary | ICD-10-CM | POA: Insufficient documentation

## 2013-11-24 DIAGNOSIS — F17200 Nicotine dependence, unspecified, uncomplicated: Secondary | ICD-10-CM | POA: Insufficient documentation

## 2013-11-24 DIAGNOSIS — Z90710 Acquired absence of both cervix and uterus: Secondary | ICD-10-CM | POA: Insufficient documentation

## 2013-11-24 DIAGNOSIS — F3289 Other specified depressive episodes: Secondary | ICD-10-CM | POA: Insufficient documentation

## 2013-11-24 DIAGNOSIS — B1920 Unspecified viral hepatitis C without hepatic coma: Secondary | ICD-10-CM

## 2013-11-24 DIAGNOSIS — R109 Unspecified abdominal pain: Secondary | ICD-10-CM

## 2013-11-24 DIAGNOSIS — N390 Urinary tract infection, site not specified: Secondary | ICD-10-CM

## 2013-11-24 DIAGNOSIS — F1010 Alcohol abuse, uncomplicated: Secondary | ICD-10-CM

## 2013-11-24 DIAGNOSIS — G43909 Migraine, unspecified, not intractable, without status migrainosus: Secondary | ICD-10-CM | POA: Insufficient documentation

## 2013-11-24 DIAGNOSIS — J4489 Other specified chronic obstructive pulmonary disease: Secondary | ICD-10-CM | POA: Insufficient documentation

## 2013-11-24 HISTORY — DX: Unspecified cirrhosis of liver: K74.60

## 2013-11-24 LAB — URINE MICROSCOPIC-ADD ON

## 2013-11-24 LAB — CBC WITH DIFFERENTIAL/PLATELET
BASOS ABS: 0 10*3/uL (ref 0.0–0.1)
Basophils Relative: 0 % (ref 0–1)
Eosinophils Absolute: 0.2 10*3/uL (ref 0.0–0.7)
Eosinophils Relative: 4 % (ref 0–5)
HCT: 36.6 % (ref 36.0–46.0)
Hemoglobin: 11.3 g/dL — ABNORMAL LOW (ref 12.0–15.0)
LYMPHS PCT: 47 % — AB (ref 12–46)
Lymphs Abs: 2.9 10*3/uL (ref 0.7–4.0)
MCH: 25.6 pg — ABNORMAL LOW (ref 26.0–34.0)
MCHC: 30.9 g/dL (ref 30.0–36.0)
MCV: 83 fL (ref 78.0–100.0)
Monocytes Absolute: 0.5 10*3/uL (ref 0.1–1.0)
Monocytes Relative: 9 % (ref 3–12)
NEUTROS ABS: 2.4 10*3/uL (ref 1.7–7.7)
Neutrophils Relative %: 40 % — ABNORMAL LOW (ref 43–77)
Platelets: 137 10*3/uL — ABNORMAL LOW (ref 150–400)
RBC: 4.41 MIL/uL (ref 3.87–5.11)
RDW: 17.6 % — AB (ref 11.5–15.5)
WBC: 6 10*3/uL (ref 4.0–10.5)

## 2013-11-24 LAB — URINALYSIS, ROUTINE W REFLEX MICROSCOPIC
Glucose, UA: NEGATIVE mg/dL
Hgb urine dipstick: NEGATIVE
Ketones, ur: 15 mg/dL — AB
Nitrite: POSITIVE — AB
PROTEIN: NEGATIVE mg/dL
SPECIFIC GRAVITY, URINE: 1.027 (ref 1.005–1.030)
UROBILINOGEN UA: 2 mg/dL — AB (ref 0.0–1.0)
pH: 5.5 (ref 5.0–8.0)

## 2013-11-24 LAB — COMPREHENSIVE METABOLIC PANEL
ALBUMIN: 3.2 g/dL — AB (ref 3.5–5.2)
ALK PHOS: 148 U/L — AB (ref 39–117)
ALT: 34 U/L (ref 0–35)
AST: 65 U/L — AB (ref 0–37)
BILIRUBIN TOTAL: 1 mg/dL (ref 0.3–1.2)
BUN: 9 mg/dL (ref 6–23)
CHLORIDE: 104 meq/L (ref 96–112)
CO2: 25 meq/L (ref 19–32)
CREATININE: 0.97 mg/dL (ref 0.50–1.10)
Calcium: 9 mg/dL (ref 8.4–10.5)
GFR calc Af Amer: 74 mL/min — ABNORMAL LOW (ref >90)
GFR calc non Af Amer: 64 mL/min — ABNORMAL LOW (ref >90)
Glucose, Bld: 127 mg/dL — ABNORMAL HIGH (ref 70–99)
POTASSIUM: 4 meq/L (ref 3.7–5.3)
Sodium: 140 mEq/L (ref 137–147)
Total Protein: 8.1 g/dL (ref 6.0–8.3)

## 2013-11-24 LAB — CBG MONITORING, ED: GLUCOSE-CAPILLARY: 97 mg/dL (ref 70–99)

## 2013-11-24 LAB — POC URINE PREG, ED: PREG TEST UR: NEGATIVE

## 2013-11-24 LAB — LIPASE, BLOOD: Lipase: 48 U/L (ref 11–59)

## 2013-11-24 MED ORDER — MORPHINE SULFATE 4 MG/ML IJ SOLN
4.0000 mg | Freq: Once | INTRAMUSCULAR | Status: AC
  Administered 2013-11-24: 4 mg via INTRAVENOUS
  Filled 2013-11-24: qty 1

## 2013-11-24 MED ORDER — PROMETHAZINE HCL 25 MG/ML IJ SOLN
25.0000 mg | Freq: Once | INTRAMUSCULAR | Status: AC
  Administered 2013-11-24: 25 mg via INTRAVENOUS
  Filled 2013-11-24: qty 1

## 2013-11-24 MED ORDER — IOHEXOL 300 MG/ML  SOLN
25.0000 mL | Freq: Once | INTRAMUSCULAR | Status: AC | PRN
  Administered 2013-11-24: 25 mL via ORAL

## 2013-11-24 MED ORDER — OXYCODONE-ACETAMINOPHEN 5-325 MG PO TABS
1.0000 | ORAL_TABLET | Freq: Once | ORAL | Status: AC
  Administered 2013-11-24: 1 via ORAL
  Filled 2013-11-24: qty 1

## 2013-11-24 MED ORDER — DIPHENHYDRAMINE HCL 50 MG/ML IJ SOLN
25.0000 mg | Freq: Once | INTRAMUSCULAR | Status: AC
  Administered 2013-11-24: 25 mg via INTRAVENOUS
  Filled 2013-11-24: qty 1

## 2013-11-24 MED ORDER — ONDANSETRON 4 MG PO TBDP
8.0000 mg | ORAL_TABLET | Freq: Once | ORAL | Status: AC
  Administered 2013-11-24: 8 mg via ORAL
  Filled 2013-11-24: qty 2

## 2013-11-24 MED ORDER — DEXTROSE 5 % IV SOLN
1.0000 g | Freq: Once | INTRAVENOUS | Status: AC
  Administered 2013-11-24: 1 g via INTRAVENOUS
  Filled 2013-11-24: qty 10

## 2013-11-24 MED ORDER — SODIUM CHLORIDE 0.9 % IV BOLUS (SEPSIS)
1000.0000 mL | Freq: Once | INTRAVENOUS | Status: AC
  Administered 2013-11-24: 1000 mL via INTRAVENOUS

## 2013-11-24 MED ORDER — IOHEXOL 300 MG/ML  SOLN
80.0000 mL | Freq: Once | INTRAMUSCULAR | Status: AC | PRN
  Administered 2013-11-24: 80 mL via INTRAVENOUS

## 2013-11-24 NOTE — ED Notes (Signed)
CT staff made aware that patient is no longer refusing her scan.

## 2013-11-24 NOTE — ED Provider Notes (Signed)
CSN: [id number]     Arrival date & time 11/24/13  1224 History   First MD Initiated Contact with Patient 11/24/13 1637     Chief Complaint  Patient presents with  . Abdominal Pain     (Consider location/radiation/quality/duration/timing/severity/associated sxs/prior Treatment) The history is provided by the patient and medical records. No language interpreter was used.    [name] is a 58 y.o. female  with a hx of emphysema, depression, diverticulitis, GERD, hepatitis C, hypertension, chronic kidney disease, seizures, stroke, pancreatitis (alocholic), rectovaginal fistula, drug and alcohol abuse presents to the Emergency Department complaining of gradual, persistent, progressively worsening lower abdominal pain with associated low back pain onset approximately 1 week ago. Patient reports she was seen here 2 days ago but the weight was "too long."  She reports seeing her primary care doctor yesterday and states that yesterday evening he told her to come to the emergency room today.  She immediately request a refill of her chronic pain medication for approximately 2 months.  I informed her that the ER will be unable to provide this. Associated symptoms include nausea, vomiting of stomach contents and diarrhea. Patient reports emesis is nonbloody, nonbilious and diarrhea is without melena or hematochezia. Patient reports taking Phenergan with relief of her nausea and vomiting.  Pt denies fever, chills, headache, neck pain, chest pain, shortness of breath, weakness, dizziness, syncope.  She does endorse hematuria and strong odor denies dysuria.  She reports recent history of urosepsis.    Record review shows that patient checked in on 11/22/2013 with complaints of strong smelling urine and abdominal and low back pain, but did not stay for evaluation.   Past Medical History  Diagnosis Date  . Emphysema of lung   . Depression   . Diverticulitis   . Chronic bronchitis   . Malignant brain tumor  2007  . GERD (gastroesophageal reflux disease)   . Allergy   . Hepatitis C   . Hypertension   . Chronic kidney disease   . Migraines   . Urine incontinence   . Seizures   . Stroke 2007    during brain surgery  . Pancreatitis, alcoholic 0000000  . Rectovaginal fistula   . H/O ETOH abuse     Sober since 2009  . H/O drug abuse     Clean since 2009  . Pancreatic ascites   . Cirrhosis    Past Surgical History  Procedure Laterality Date  . Appendectomy  1999  . Eye surgery    . Rectovaginal fistula repair w/ colostomy  2011  . Brain surgery  2007    malignant brain tumor  . Cholecystectomy  2001  . Abdominal hysterectomy  1979   Family History  Problem Relation Age of Onset  . Hypertension Mother   . Heart disease Father   . Diabetes Father   . Cancer Sister     brain  . Cancer Grandchild 8    brain tumor   History  Substance Use Topics  . Smoking status: Current Some Day Smoker -- 0.50 packs/day    Types: Cigarettes  . Smokeless tobacco: Never Used  . Alcohol Use: No     Comment: no alcohol x 79 days   OB History   Grav Para Term Preterm Abortions TAB SAB Ect Mult Living                 Review of Systems  Constitutional: Negative for fever, diaphoresis, appetite change, fatigue and unexpected weight change.  HENT: Negative for mouth sores and trouble swallowing.   Respiratory: Negative for cough, chest tightness, shortness of breath, wheezing and stridor.   Cardiovascular: Negative for chest pain and palpitations.  Gastrointestinal: Positive for nausea, vomiting, abdominal pain and diarrhea. Negative for constipation, blood in stool, abdominal distention and rectal pain.  Endocrine: Negative for polydipsia, polyphagia and polyuria.  Genitourinary: Negative for dysuria, urgency, frequency, hematuria, flank pain and difficulty urinating.  Musculoskeletal: Negative for back pain, neck pain and neck stiffness.  Skin: Negative for rash.  Allergic/Immunologic:  Negative for immunocompromised state.  Neurological: Negative for weakness.  Hematological: Negative for adenopathy.  Psychiatric/Behavioral: Negative for confusion.  All other systems reviewed and are negative.     Allergies  Dilaudid; Sulfa antibiotics; Zofran; Chocolate; Penicillins; and Strawberry  Home Medications   Prior to Admission medications   Medication Sig Start Date End Date Taking? Authorizing Provider  albuterol (PROAIR HFA) 108 (90 BASE) MCG/ACT inhaler Inhale 2 puffs into the lungs every 6 (six) hours as needed for wheezing or shortness of breath.   Yes Historical Provider, MD  albuterol (PROVENTIL) (5 MG/ML) 0.5% nebulizer solution Take 2.5 mg by nebulization 4 (four) times daily.   Yes Historical Provider, MD  ALPRAZolam Duanne Moron) 1 MG tablet Take 1 tablet (1 mg total) by mouth daily. 10/21/12  Yes Raquel Rey, NP  amitriptyline (ELAVIL) 50 MG tablet Take 1 tablet (50 mg total) by mouth at bedtime. 12/01/12  Yes Jackolyn Confer, MD  amLODipine (NORVASC) 10 MG tablet Take 1 tablet (10 mg total) by mouth daily. 10/21/12  Yes Raquel Rey, NP  CREON 12000 UNITS CPEP Take 2 capsules by mouth 3 (three) times daily before meals.  08/11/12  Yes Historical Provider, MD  ENDOCET 10-325 MG per tablet Take 1 tablet by mouth every 6 (six) hours as needed for pain.  11/27/12  Yes Historical Provider, MD  escitalopram (LEXAPRO) 10 MG tablet Take 1 tablet (10 mg total) by mouth daily. 10/21/12  Yes Raquel Rey, NP  Fluticasone-Salmeterol (ADVAIR) 250-50 MCG/DOSE AEPB Inhale 1 puff into the lungs daily as needed (shortness of breath).    Yes Historical Provider, MD  gabapentin (NEURONTIN) 300 MG capsule Take 300 mg by mouth 2 (two) times daily as needed (pain).   Yes Historical Provider, MD  lactulose (CHRONULAC) 10 GM/15ML solution Take 20 g by mouth daily as needed for mild constipation.  09/07/12  Yes Historical Provider, MD  levETIRAcetam (KEPPRA) 500 MG tablet Take 1 tablet (500 mg total) by  mouth 2 (two) times daily. 11/03/12  Yes Jackolyn Confer, MD  metoprolol succinate (TOPROL-XL) 25 MG 24 hr tablet Take 1 tablet (25 mg total) by mouth daily. 10/21/12  Yes Raquel Rey, NP  omeprazole (PRILOSEC) 40 MG capsule Take 40 mg by mouth 2 (two) times daily.   Yes Historical Provider, MD  phentermine (ADIPEX-P) 37.5 MG tablet Take 37.5 mg by mouth daily before breakfast.   Yes Historical Provider, MD  phenytoin (DILANTIN) 125 MG/5ML suspension Take 12 mLs (300 mg total) by mouth 2 (two) times daily. 11/03/12  Yes Jackolyn Confer, MD  promethazine (PHENERGAN) 25 MG tablet Take 25 mg by mouth every 6 (six) hours as needed for nausea or vomiting.  11/27/12  Yes Historical Provider, MD  QUEtiapine (SEROQUEL) 100 MG tablet Take 3.5 tablets (350 mg total) by mouth daily. 10/21/12  Yes Raquel Rey, NP  solifenacin (VESICARE) 10 MG tablet Take 10 mg by mouth daily.   Yes Historical Provider, MD  spironolactone (ALDACTONE) 50 MG tablet Take 1 tablet (50 mg total) by mouth 3 (three) times daily. 10/21/12  Yes Raquel Rey, NP  temazepam (RESTORIL) 30 MG capsule Take 30 mg by mouth at bedtime.   Yes Historical Provider, MD  tiotropium (SPIRIVA) 18 MCG inhalation capsule Place 18 mcg into inhaler and inhale 2 (two) times daily as needed (shortness of breath).    Yes Historical Provider, MD  triamcinolone (NASACORT ALLERGY 24HR) 55 MCG/ACT AERO nasal inhaler Place 2 sprays into the nose daily as needed (allergies).   Yes Historical Provider, MD  zolpidem (AMBIEN) 10 MG tablet Take 1 tablet (10 mg total) by mouth at bedtime. 10/21/12  Yes Raquel Rey, NP   BP 104/82  Pulse 92  Temp(Src) 98 F (36.7 C) (Oral)  Resp 18  SpO2 100% Physical Exam  Nursing note and vitals reviewed. Constitutional: She is oriented to person, place, and time. She appears well-developed and well-nourished. No distress.  Awake, alert, nontoxic appearance  HENT:  Head: Normocephalic and atraumatic.  Mouth/Throat: Oropharynx is clear  and moist. No oropharyngeal exudate.  Eyes: Conjunctivae are normal. Pupils are equal, round, and reactive to light. No scleral icterus.  Neck: Normal range of motion. Neck supple.  Full ROM without pain  Cardiovascular: Normal rate, regular rhythm, normal heart sounds and intact distal pulses.   No murmur heard. Pulmonary/Chest: Effort normal and breath sounds normal. No respiratory distress. She has no wheezes.  Abdominal: Soft. Bowel sounds are normal. She exhibits no distension and no mass. There is no hepatosplenomegaly. There is tenderness in the suprapubic area and left lower quadrant. There is guarding. There is no rebound and no CVA tenderness.  Obese Lower quadrant abdominal tenderness with voluntary guarding; no rigidity, rebound or peritoneal signs Well healed surgical scars midline abdomen  Musculoskeletal: Normal range of motion. She exhibits no edema.  Full range of motion of the T-spine and L-spine No tenderness to palpation of the spinous processes of the T-spine or L-spine Mild tenderness to palpation of the paraspinous muscles of the L-spine  Lymphadenopathy:    She has no cervical adenopathy.  Neurological: She is alert and oriented to person, place, and time. She has normal reflexes. She exhibits normal muscle tone. Coordination normal.  Speech is clear and goal oriented, follows commands Normal 5/5 strength in upper and lower extremities bilaterally including dorsiflexion and plantar flexion, strong and equal grip strength Sensation normal to light and sharp touch Moves extremities without ataxia, coordination intact Normal gait Normal balance   Skin: Skin is warm and dry. No rash noted. She is not diaphoretic. No erythema.  Psychiatric: She has a normal mood and affect. Her behavior is normal.    ED Course  Procedures (including critical care time) Labs Review Labs Reviewed  CBC WITH DIFFERENTIAL - Abnormal; Notable for the following:    Hemoglobin 11.3 (*)     MCH 25.6 (*)    RDW 17.6 (*)    Platelets 137 (*)    Neutrophils Relative % 40 (*)    Lymphocytes Relative 47 (*)    All other components within normal limits  COMPREHENSIVE METABOLIC PANEL - Abnormal; Notable for the following:    Glucose, Bld 127 (*)    Albumin 3.2 (*)    AST 65 (*)    Alkaline Phosphatase 148 (*)    GFR calc non Af Amer 64 (*)    GFR calc Af Amer 74 (*)    All other components within normal limits  URINALYSIS, ROUTINE W REFLEX MICROSCOPIC - Abnormal; Notable for the following:    Color, Urine AMBER (*)    APPearance CLOUDY (*)    Bilirubin Urine SMALL (*)    Ketones, ur 15 (*)    Urobilinogen, UA 2.0 (*)    Nitrite POSITIVE (*)    Leukocytes, UA SMALL (*)    All other components within normal limits  URINE MICROSCOPIC-ADD ON - Abnormal; Notable for the following:    Squamous Epithelial / LPF MANY (*)    Bacteria, UA MANY (*)    All other components within normal limits  LIPASE, BLOOD  CBG MONITORING, ED  POC URINE PREG, ED    Imaging Review Ct Abdomen Pelvis W Contrast  11/24/2013   CLINICAL DATA:  Lower abdominal pain.  EXAM: CT ABDOMEN AND PELVIS WITH CONTRAST  TECHNIQUE: Multidetector CT imaging of the abdomen and pelvis was performed using the standard protocol following bolus administration of intravenous contrast.  CONTRAST:  80 mL OMNIPAQUE IOHEXOL 300 MG/ML  SOLN  COMPARISON:  CT abdomen and pelvis 09/16/2013.  FINDINGS: Dependent atelectasis in the lung bases is again seen. There is no pleural or pericardial effusion. Heart size is normal.  Nodular border of the liver consistent with cirrhosis is again seen. No focal liver lesion is identified. The splenic and portal veins are patent. The patient is status post cholecystectomy. The right adrenal gland appears normal. Small left adrenal nodule most consistent with an adenoma is unchanged. The right adrenal gland, pancreas and kidneys appear normal. The patient is status post hysterectomy. Urinary  bladder appears normal. The stomach and small and large bowel appear normal. The appendix is not discretely visualized. No evidence of inflammatory process is seen. There is no lymphadenopathy or fluid. Scattered aortoiliac atherosclerosis is noted. No focal bony abnormality is identified.  IMPRESSION: No acute finding abdomen or pelvis.  Cirrhosis, unchanged.   Electronically Signed   By: Inge Rise M.D.   On: 11/24/2013 20:44     EKG Interpretation None      MDM   Final diagnoses:  Abdominal pain  Low back pain  UTI (lower urinary tract infection)  Hepatitis C  Narcotic abuse  Alcohol abuse, unspecified   Clide Deutscher CBC without leukocytosis. Mild anemia at 11.3. CMP with mildly elevated AST but normal ALT. Lipase within normal limits. No evidence of sepsis today. No evidence of pancreatitis today. UA pending.  6:02 PM UA with evidence of uncomplicated UTI. Will give Rocephin IV. CT scan pending. Patient remains alert, oriented, nontoxic and nonseptic appearing.  She's now tearful and questioning why will not write her pain prescriptions and why I "don't care about her."  I have again explained to the patient that I am happy to treat her pain today but to the ER cannot write refills of chronic pain medications. She will need to followup with her primary care or a pain clinic for this.  Patient reports that her pain is controlled at this time.   8:52 PM CT with No acute finding abdomen or pelvis.   I personally reviewed the imaging tests through PACS system  I reviewed available ER/hospitalization records through the EMR Patient has remained alert, oriented, nontoxic nonseptic her time here in the emergency department. She continued to request narcotic pain medication which has been provided to her here in the emergency department but she requests only a prescription to go home with. CT with without evidence of diverticulitis which was my initial concern. Patient is to  followup  with her primary care physician for further evaluation.  Patient is nontoxic, nonseptic appearing, in no apparent distress.  Patient's pain and other symptoms adequately managed in emergency department.  Fluid bolus given.  Labs, imaging and vitals reviewed.  Patient does not meet the SIRS or Sepsis criteria.  On repeat exam patient does not have a surgical abdomin and there are no peritoneal signs.  No indication of appendicitis, bowel obstruction, bowel perforation, cholecystitis, diverticulitis.  Patient discharged home with symptomatic treatment and given strict instructions for follow-up with their primary care physician.  I have also discussed reasons to return immediately to the ER.  Patient expresses understanding and agrees with plan.  It has been determined that no acute conditions requiring further emergency intervention are present at this time. The patient/guardian have been advised of the diagnosis and plan. We have discussed signs and symptoms that warrant return to the ED, such as changes or worsening in symptoms.   Vital signs are stable at discharge.   BP 104/82  Pulse 92  Temp(Src) 98 F (36.7 C) (Oral)  Resp 18  SpO2 100%  Patient/guardian has voiced understanding and agreed to follow-up with the PCP or specialist.         Abigail Butts, PA-C 11/24/13 2102

## 2013-11-24 NOTE — ED Notes (Signed)
Pt stating many medical complaints to RN including coughing up "stuff that's grey", N/V/D, lower abdominal and back pain, hx of cancer, diabetes, pancreatitis, and urosepsis.

## 2013-11-24 NOTE — ED Notes (Signed)
Went in to introduce myself to patient and patient family member. Pt states, "I do not want my ct scan- I want to go home."

## 2013-11-24 NOTE — ED Notes (Signed)
Patient with noted lower bp at this time.  She has recent hx of urosepsis.  Patient acuity changed

## 2013-11-24 NOTE — ED Notes (Signed)
Pt is scratching a great deal all over her body, states "please get some something for itching". PA notified.

## 2013-11-24 NOTE — Discharge Instructions (Signed)
1. Medications: phenergan already Rx at home, usual home medications 2. Treatment: rest, drink plenty of fluids, advance diet slowly 3. Follow Up: Please followup with your primary doctor for discussion of your diagnoses and further evaluation after today's visit; if you do not have a primary care doctor use the resource guide provided to find one;     Abdominal Pain, Women Abdominal (stomach, pelvic, or belly) pain can be caused by many things. It is important to tell your doctor:  The location of the pain.  Does it come and go or is it present all the time?  Are there things that start the pain (eating certain foods, exercise)?  Are there other symptoms associated with the pain (fever, nausea, vomiting, diarrhea)? All of this is helpful to know when trying to find the cause of the pain. CAUSES   Stomach: virus or bacteria infection, or ulcer.  Intestine: appendicitis (inflamed appendix), regional ileitis (Crohn's disease), ulcerative colitis (inflamed colon), irritable bowel syndrome, diverticulitis (inflamed diverticulum of the colon), or cancer of the stomach or intestine.  Gallbladder disease or stones in the gallbladder.  Kidney disease, kidney stones, or infection.  Pancreas infection or cancer.  Fibromyalgia (pain disorder).  Diseases of the female organs:  Uterus: fibroid (non-cancerous) tumors or infection.  Fallopian tubes: infection or tubal pregnancy.  Ovary: cysts or tumors.  Pelvic adhesions (scar tissue).  Endometriosis (uterus lining tissue growing in the pelvis and on the pelvic organs).  Pelvic congestion syndrome (female organs filling up with blood just before the menstrual period).  Pain with the menstrual period.  Pain with ovulation (producing an egg).  Pain with an IUD (intrauterine device, birth control) in the uterus.  Cancer of the female organs.  Functional pain (pain not caused by a disease, may improve without  treatment).  Psychological pain.  Depression. DIAGNOSIS  Your doctor will decide the seriousness of your pain by doing an examination.  Blood tests.  X-rays.  Ultrasound.  CT scan (computed tomography, special type of X-ray).  MRI (magnetic resonance imaging).  Cultures, for infection.  Barium enema (dye inserted in the large intestine, to better view it with X-rays).  Colonoscopy (looking in intestine with a lighted tube).  Laparoscopy (minor surgery, looking in abdomen with a lighted tube).  Major abdominal exploratory surgery (looking in abdomen with a large incision). TREATMENT  The treatment will depend on the cause of the pain.   Many cases can be observed and treated at home.  Over-the-counter medicines recommended by your caregiver.  Prescription medicine.  Antibiotics, for infection.  Birth control pills, for painful periods or for ovulation pain.  Hormone treatment, for endometriosis.  Nerve blocking injections.  Physical therapy.  Antidepressants.  Counseling with a psychologist or psychiatrist.  Minor or major surgery. HOME CARE INSTRUCTIONS   Do not take laxatives, unless directed by your caregiver.  Take over-the-counter pain medicine only if ordered by your caregiver. Do not take aspirin because it can cause an upset stomach or bleeding.  Try a clear liquid diet (broth or water) as ordered by your caregiver. Slowly move to a bland diet, as tolerated, if the pain is related to the stomach or intestine.  Have a thermometer and take your temperature several times a day, and record it.  Bed rest and sleep, if it helps the pain.  Avoid sexual intercourse, if it causes pain.  Avoid stressful situations.  Keep your follow-up appointments and tests, as your caregiver orders.  If the pain does not  go away with medicine or surgery, you may try:  Acupuncture.  Relaxation exercises (yoga, meditation).  Group therapy.  Counseling. SEEK  MEDICAL CARE IF:   You notice certain foods cause stomach pain.  Your home care treatment is not helping your pain.  You need stronger pain medicine.  You want your IUD removed.  You feel faint or lightheaded.  You develop nausea and vomiting.  You develop a rash.  You are having side effects or an allergy to your medicine. SEEK IMMEDIATE MEDICAL CARE IF:   Your pain does not go away or gets worse.  You have a fever.  Your pain is felt only in portions of the abdomen. The right side could possibly be appendicitis. The left lower portion of the abdomen could be colitis or diverticulitis.  You are passing blood in your stools (bright red or black tarry stools, with or without vomiting).  You have blood in your urine.  You develop chills, with or without a fever.  You pass out. MAKE SURE YOU:   Understand these instructions.  Will watch your condition.  Will get help right away if you are not doing well or get worse. Document Released: 04/21/2007 Document Revised: 09/16/2011 Document Reviewed: 05/11/2009 [facility] Patient Information 2014 [place], [place].    Emergency Department Resource Guide 1) Find a Doctor and Pay Out of Pocket Although you won't have to find out who is covered by your insurance plan, it is a good idea to ask around and get recommendations. You will then need to call the office and see if the doctor you have chosen will accept you as a new patient and what types of options they offer for patients who are self-pay. Some doctors offer discounts or will set up payment plans for their patients who do not have insurance, but you will need to ask so you aren't surprised when you get to your appointment.  2) Contact Your Local Health Department Not all health departments have doctors that can see patients for sick visits, but many do, so it is worth a call to see if yours does. If you don't know where your local health department is, you can check in your  phone book. The CDC also has a tool to help you locate your state's health department, and many state websites also have listings of all of their local health departments.  3) Find a [facility] If your illness is not likely to be very severe or complicated, you may want to try a walk in clinic. These are popping up all over the country in pharmacies, drugstores, and shopping centers. They're usually staffed by nurse practitioners or physician assistants that have been trained to treat common illnesses and complaints. They're usually fairly quick and inexpensive. However, if you have serious medical issues or chronic medical problems, these are probably not your best option.  No Primary Care Doctor: - Call Health Connect at  [phone] - they can help you locate a primary care doctor that  accepts your insurance, provides certain services, etc. - Physician Referral Service- [phone]  Chronic Pain Problems: Organization         Address  Phone   Notes  [facility]  [phone] Patients need to be referred by their primary care doctor.   Medication Assistance: Organization         Address  Phone   Notes  [facility] Medication [facility] [place] [place], [place] [address] [phone]  [phone] --Must be a resident of [facility] -- Must have NO insurance coverage whatsoever (no Medicaid/ Medicare, etc.) -- The pt. MUST have a primary care doctor that directs their care regularly and follows them in the community   [facility]ist  [phone]   [employer]  [phone]    Agencies that provide inexpensive medical care: Organization         Address  Phone   Notes  [place]  [phone]   [facility]    [phone]   [facility] [place], [place] [address] [phone]   [place] [address] [place] [phone]   Planned  [facility]    [phone]   [facility]    [phone]   [place] and [place] [address], [place] Phone:  [phone], Fax:  [phone] Hours of Operation:  9 am - 6 pm, M-F.  Also accepts Medicaid/Medicare and self-pay.  [facility] for [place], [address], [place] Phone: [phone], Fax: [phone]. Hours of Operation:  8:30 am - 5:30 pm, M-F.  Also accepts Medicaid and self-pay.  [facility] [place] [address], [place] Phone: [phone]   [place], [place], [place] [phone], Ext. [phone] Mondays & Thursdays: 7-9 AM.  First 15 patients are seen on a first come, first serve basis.    [place] Providers:  Organization         Address  Phone   Notes  [facility] [address], [place] [phone] Also accepts self-pay patients.  [facility] [address], [place]  [phone]   [place], [address], [place] [phone]   [facility] [address] [place] [phone]   Lucianne Lei [address], [place]   [phone] Only accepts [place] Access [place] patients after they have their name applied to their card.   Self-Pay (no insurance) in [facility]:  Organization         Address  Phone   Notes  Sickle Cell Patients, [facility] [place] [phone]   [facility] [place] [phone]   [facility] [place]  [place], [place], [place] [phone]   [facility]/Dr. Osei-Bonsu  [address], [place] or [address], [place] [phone] Phone number for both [place] and [place] locations is the same.   [facility] [address] [place] [phone]   [facility] [address] [place] or [address] [phone]   [facility] [address] [place] [phone], phone; [phone], fax Sees patients 1st and 3rd Saturday of every month.  Must not qualify for public or private insurance (i.e. Medicaid, Medicare, [place] Health Choice, Veterans' Benefits)  Household income should be no more than 200% of the poverty level The clinic cannot treat you if you are pregnant or think you are pregnant  Sexually transmitted diseases are not treated at the clinic.    Dental Care: Organization  Address  Phone  Notes  [facility] [place], [place] [phone] Accepts children up to age 46 who are enrolled in [place] or [place]; pregnant women with a Medicaid card; and children who have applied for Medicaid or [place] Health Choice, but were declined, whose parents can pay a reduced fee at time of service.  [facility] Department of [facility]  [address], [place] [phone] Accepts children up to age 75 who are enrolled in [place] or [place]; pregnant women with a Medicaid card; and children who have applied for Medicaid or [place] Health Choice, but were declined, whose parents can pay a reduced fee at time of service.  [facility]  [place] [phone] Patients are seen by appointment only. Walk-ins are not accepted. [place] will see patients 11 years of age and older. Monday - Tuesday (8am-5pm) Most Wednesdays (8:30-5pm) $30 per visit, cash only  [facility] Adult [facility] Access PRO[facility]  [address], [facility] [phone] Patients are seen by appointment only. Walk-ins are not accepted. [place] will see patients 10  years of age and older. One Wednesday Evening (Monthly: Volunteer Based).  $30 per visit, cash only  [place]  [phone] for adults; Children under age 50, call Graduate Pediatric Dentistry at [phone]. Children aged 26-14, please call [phone] to request a pediatric application.  Dental services are provided in all areas of dental care including fillings, crowns and bridges, complete and partial dentures, implants, gum treatment, root canals, and extractions. Preventive care is also provided. Treatment is provided to both adults and children. Patients are selected via a lottery and there is often a waiting list.   [facility] [address], [place]  [phone] [email or web address]   [facility] [address] [place], [place] [phone], Ext. [phone] Second and Fourth Thursday of each month, opens at 6:30 AM; Clinic ends at 9 AM.  Patients are seen on a first-come first-served basis, and a limited number are seen during each clinic.   [facility]  [address] Hillard Danker [place], [place] [phone]   Eligibility Requirements You must have lived in [place], [place], or [place] counties for at least the last three months.   You cannot be eligible for state or federal sponsored [employer], including [employer], [place], or [employer].   You generally cannot be eligible for healthcare insurance through your employer.    How to apply: Eligibility screenings are held every Tuesday and Wednesday afternoon from 1:00 pm until 4:00 pm. You do not need an appointment for the interview!  [facility] [address], [place], [place]   [place]  [place] Department  [place]  [phone]    Behavioral Health Resources in the Community: Intensive Outpatient  Programs Organization         Address  Phone  Notes  [place] Keiser. [address] [place], [place] [phone]   [facility] Outpatient [address], [place], [place]   ADS: [facility] [address], [place], [place]   [place] [address]  [place], [place] or [phone]   Substance Abuse Resources  Organization         Address  Phone  Notes  Alcohol and Drug Services  [place]  [phone]   [facility] [place]  [phone]   Chinita Pester  [phone]   Residential & Outpatient Substance Abuse Program  [phone]   Psychological Services Organization         Address  Phone  Notes  [facility] [place]  [place]  [phone]   [place] [address] [place] or [phone]    Mobile Crisis Teams Organization         Address  Phone  Notes  [facility], Mobile Crisis Care Unit  [phone]   [facility]  [address] [place], [place]   [place] [address] [place] [phone]    Self-Help/Support Groups Organization         Address  Phone             Notes  [place]. of [place] - variety of support groups  [facility] [facility] for more information  Narcotics Anonymous (NA), [facility] [address], [employer] [place]  2 meetings at this location   Special educational needs teacher         Address  Phone  Notes  ASAP Residential Treatment [place],    [place]  [phone]   [facility]  [address], [place] [address], [place], [place]   [place], [place] [phone] Admissions: 8am-3pm M-F  Incentives Substance [place] [address]    [place], [place]  [phone]   [facility] [address] [place], [employer], [place]   [facility] [address],  [place], [place]   [facility] - Intensive Outpatient [address] Kristeen [address], [place], [place]   [facility] [place].,  [place], [place] [phone] or [phone]   [facility] Services (RTS) [address], [place], [place] Accepts Medicaid  [facility] [place] [address],  [place] [phone] Substance Abuse/Addiction Treatment   [facility] Organization         Address  Phone  Notes  [facility]  [phone]   Domenic Schwab, PhD [address] [place], [place]   [phone] or [phone]   [place], [place] [phone]   [facility] [address], [place], [place] [phone] Insurance/Medicaid/sponsorship through [facility] and Families [address], [address] [place]                                    [place], [place] [phone] [place] [address], [place] [phone]    Dr. Adele Schilder  [phone]   [facility] Dept. 1) [address] [place] 2) [place] 3)  [place] [address], [place] [phone]  [phone]   [place] [phone] or [phone] (After Hours)

## 2013-11-24 NOTE — ED Notes (Signed)
Hannah, PA at bedside  

## 2013-11-24 NOTE — ED Notes (Addendum)
Lower abd pain and back pain for several days  Came the other day but did not stay urine smells bad  She sates

## 2013-11-24 NOTE — ED Notes (Signed)
Attempted to draw pts labs was unsuccessful. Called phlebotomy spoke with Saa he stated he would draw pts labs.

## 2013-11-27 NOTE — ED Provider Notes (Signed)
Medical screening examination/treatment/procedure(s) were performed by non-physician practitioner and as supervising physician I was immediately available for consultation/collaboration.   EKG Interpretation None        Tanna Furry, MD 11/27/13 1008

## 2013-12-05 ENCOUNTER — Emergency Department: Payer: Self-pay | Admitting: Emergency Medicine

## 2013-12-05 LAB — URINALYSIS, COMPLETE
BLOOD: NEGATIVE
Bilirubin,UR: NEGATIVE
GLUCOSE, UR: NEGATIVE mg/dL (ref 0–75)
Ketone: NEGATIVE
Leukocyte Esterase: NEGATIVE
Nitrite: NEGATIVE
Ph: 6 (ref 4.5–8.0)
Protein: NEGATIVE
RBC,UR: 1 /HPF (ref 0–5)
SPECIFIC GRAVITY: 1.012 (ref 1.003–1.030)
Squamous Epithelial: 9
WBC UR: 2 /HPF (ref 0–5)

## 2013-12-08 IMAGING — CR DG CHEST 1V PORT
1 series · 1 of 1 positions shown · non-contrast
Comparison: none

REASON FOR EXAM: pain under breast
COMMENTS:

[portable]
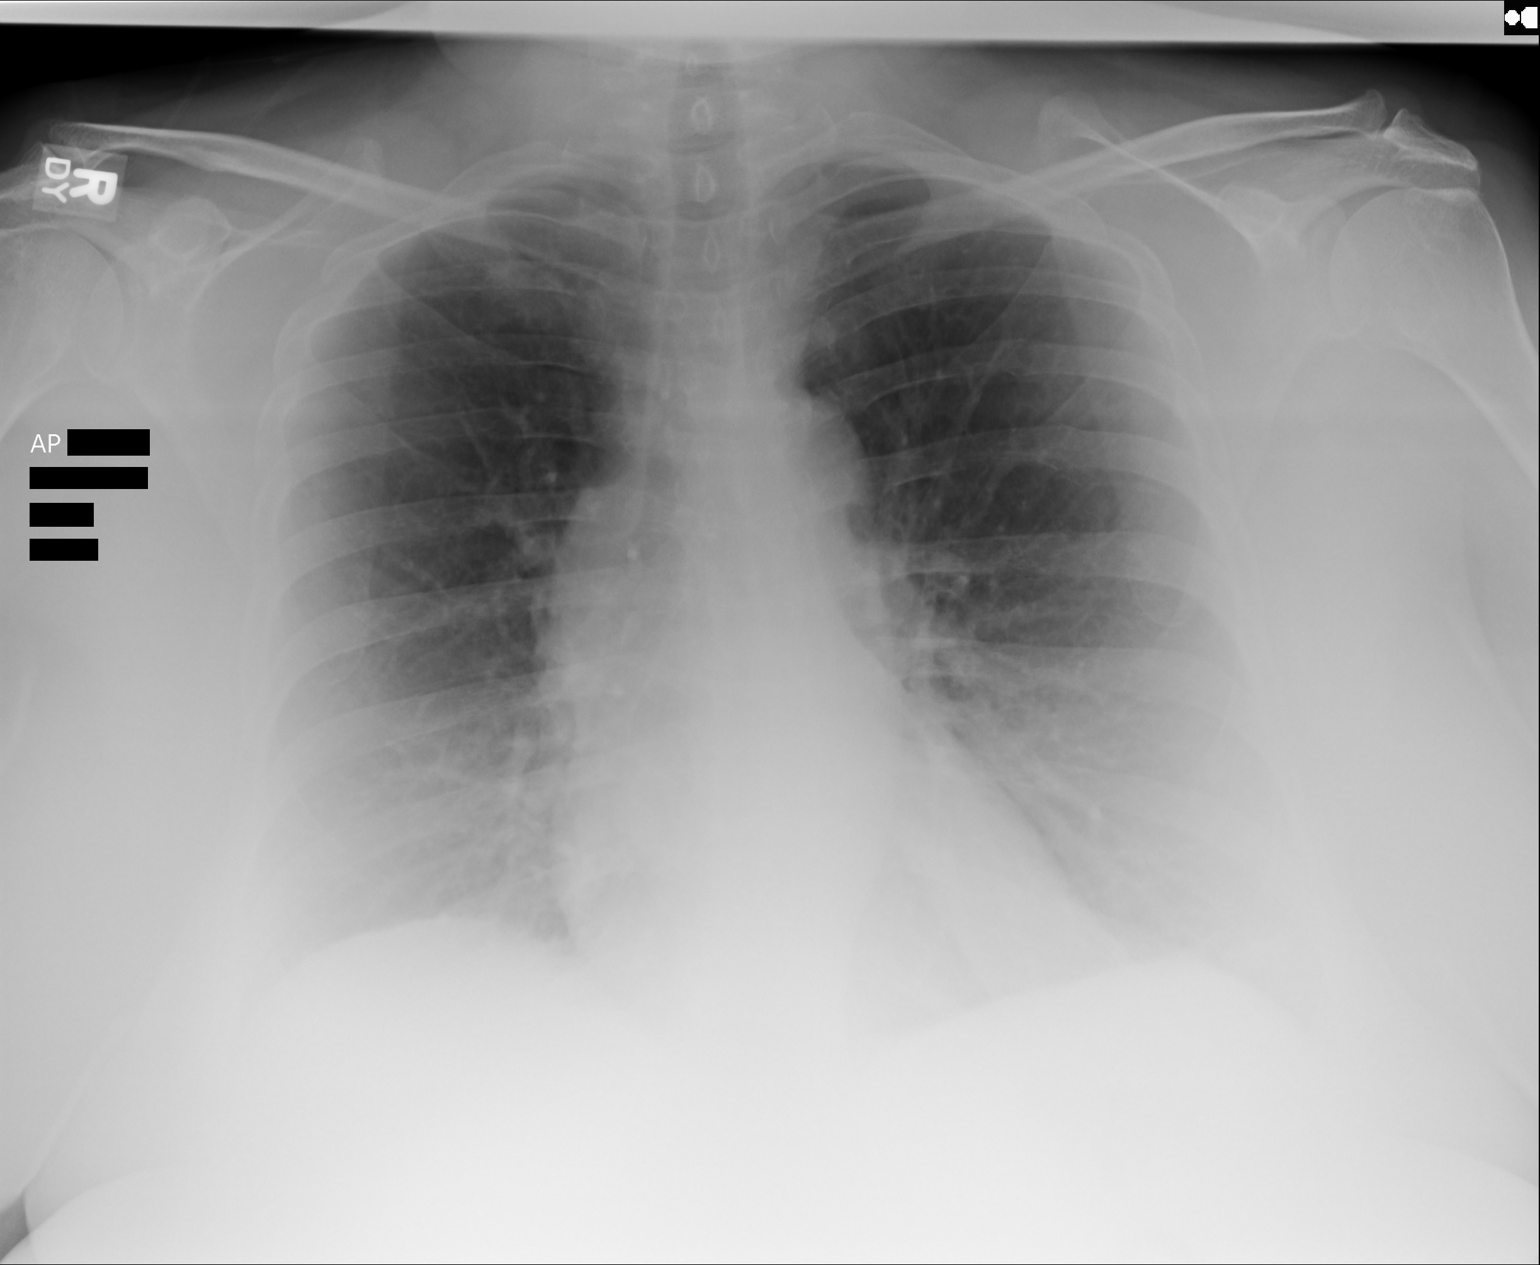

[1 of 1 positions shown; findings below may reference images not displayed]

PROCEDURE:     DXR - DXR PORTABLE CHEST SINGLE VIEW  - December 29, 2011  [DATE]

RESULT:     Comparison is made to the study dated 25 September, 2011.

The lungs are hyperinflated consistent with COPD. The cardiac silhouette is
normal. There is no edema, infiltrate, effusion, mass or pneumothorax. The
bony structures are unremarkable.
IMPRESSION: 1. Hyperinflation consistent with COPD. PA and lateral followup images would
be helpful if the patient is acutely symptomatic.

[REDACTED]

## 2014-01-20 ENCOUNTER — Emergency Department: Payer: Self-pay | Admitting: Emergency Medicine

## 2014-01-20 LAB — CBC
HCT: 35.2 % (ref 35.0–47.0)
HGB: 10.8 g/dL — ABNORMAL LOW (ref 12.0–16.0)
MCH: 25.8 pg — AB (ref 26.0–34.0)
MCHC: 30.7 g/dL — AB (ref 32.0–36.0)
MCV: 84 fL (ref 80–100)
PLATELETS: 114 10*3/uL — AB (ref 150–440)
RBC: 4.19 10*6/uL (ref 3.80–5.20)
RDW: 18 % — AB (ref 11.5–14.5)
WBC: 6.4 10*3/uL (ref 3.6–11.0)

## 2014-01-20 LAB — COMPREHENSIVE METABOLIC PANEL
ALT: 36 U/L (ref 12–78)
ANION GAP: 6 — AB (ref 7–16)
Albumin: 3 g/dL — ABNORMAL LOW (ref 3.4–5.0)
Alkaline Phosphatase: 141 U/L — ABNORMAL HIGH
BUN: 6 mg/dL — ABNORMAL LOW (ref 7–18)
Bilirubin,Total: 0.7 mg/dL (ref 0.2–1.0)
CHLORIDE: 109 mmol/L — AB (ref 98–107)
CREATININE: 1.1 mg/dL (ref 0.60–1.30)
Calcium, Total: 8.7 mg/dL (ref 8.5–10.1)
Co2: 24 mmol/L (ref 21–32)
EGFR (African American): >60
GFR CALC NON AF AMER: 56 — AB
GLUCOSE: 104 mg/dL — AB (ref 65–99)
OSMOLALITY: 275 (ref 275–301)
POTASSIUM: 3.6 mmol/L (ref 3.5–5.1)
SGOT(AST): 59 U/L — ABNORMAL HIGH (ref 15–37)
Sodium: 139 mmol/L (ref 136–145)
Total Protein: 8.6 g/dL — ABNORMAL HIGH (ref 6.4–8.2)

## 2014-01-20 LAB — URINALYSIS, COMPLETE
BILIRUBIN, UR: NEGATIVE
GLUCOSE, UR: NEGATIVE mg/dL (ref 0–75)
Ketone: NEGATIVE
Nitrite: POSITIVE
PROTEIN: NEGATIVE
Ph: 6 (ref 4.5–8.0)
RBC,UR: 3 /HPF (ref 0–5)
Specific Gravity: 1.019 (ref 1.003–1.030)

## 2014-01-20 LAB — LIPASE, BLOOD: LIPASE: 197 U/L (ref 73–393)

## 2014-01-20 LAB — TROPONIN I: Troponin-I: <0.02 ng/mL

## 2014-01-29 IMAGING — CR DG CHEST 1V PORT
1 series · 1 of 1 positions shown · non-contrast
Comparison: none

REASON FOR EXAM: sob/cough
COMMENTS:

[portable]
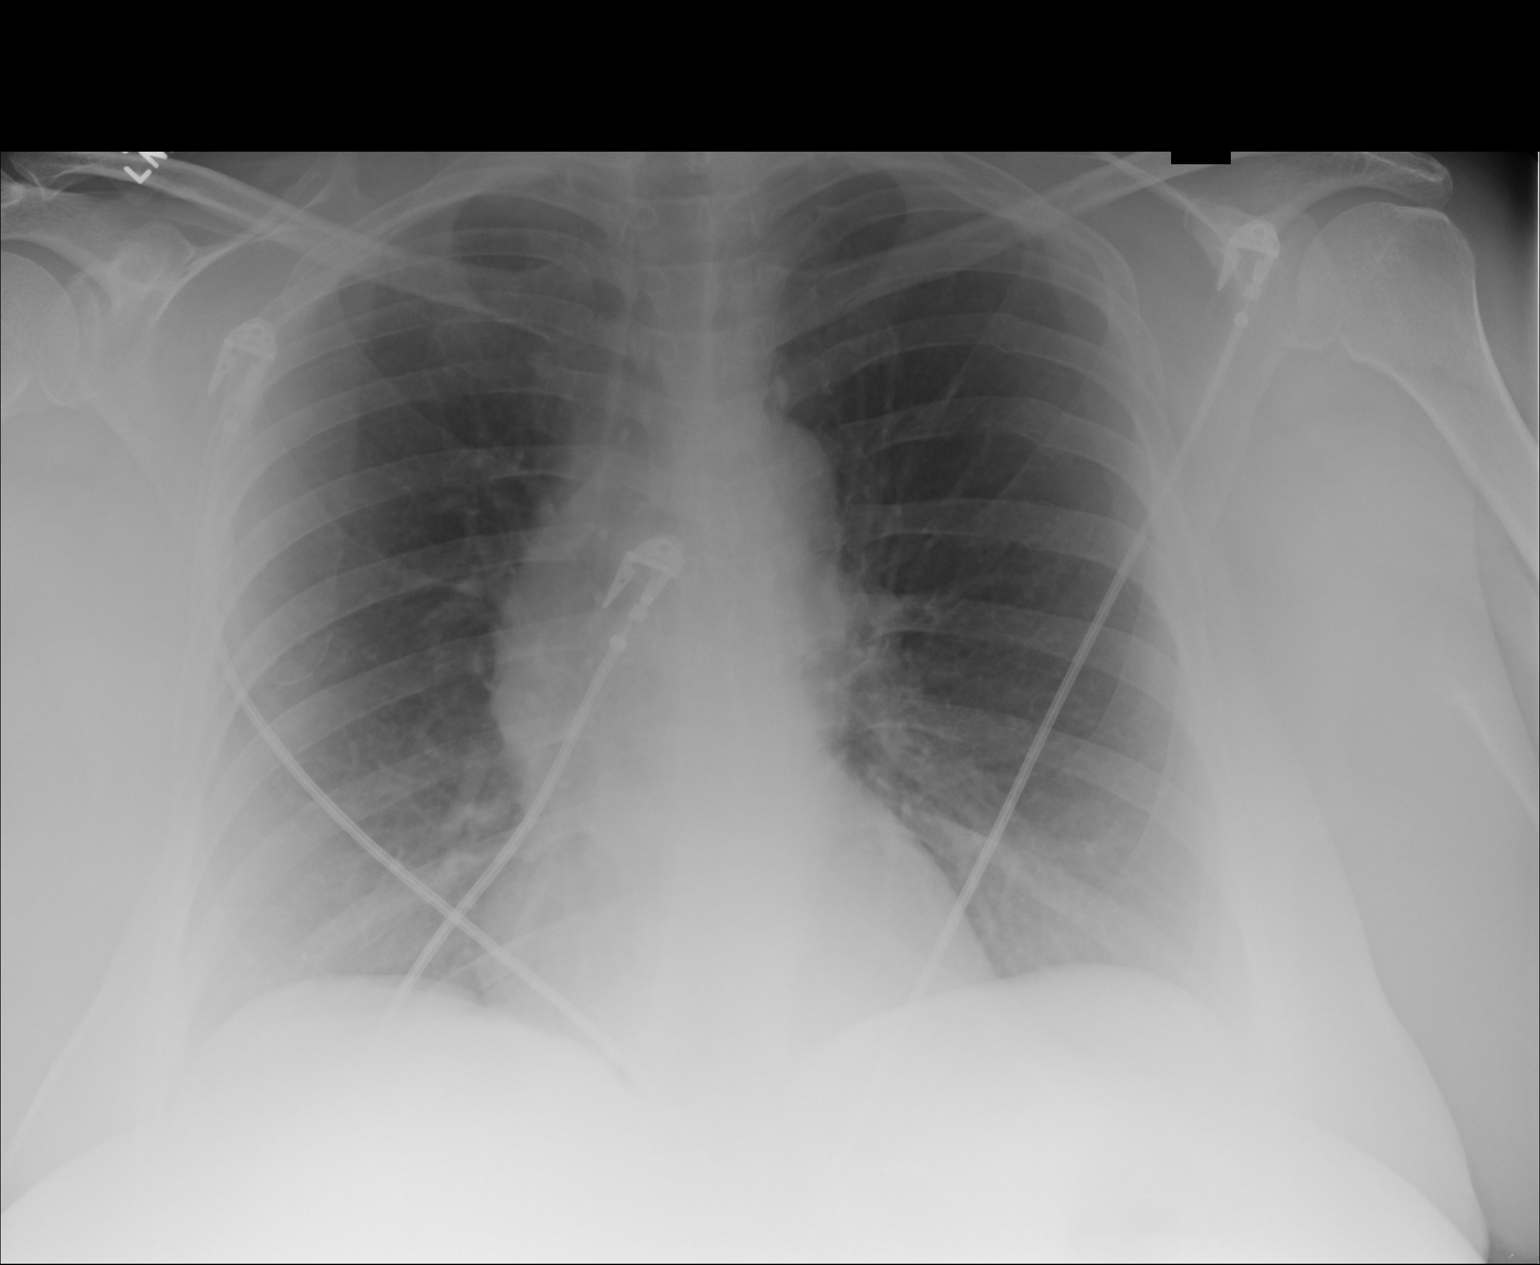

[1 of 1 positions shown; findings below may reference images not displayed]

PROCEDURE:     DXR - DXR PORTABLE CHEST SINGLE VIEW  - February 19, 2012  [DATE]

RESULT:     Comparison made to the study 29 December, 2011.

The lungs are mildly hyperinflated. There is no focal infiltrate. The
cardiac silhouette is normal in size. There is tortuosity of the ascending
thoracic aorta. The mediastinum is normal in width. There is no pleural
effusion or pneumothorax.
IMPRESSION: There is hyperinflation consistent with COPD or reactive
airway disease. There is no evidence of CHF nor of pneumonia.

[REDACTED]

## 2014-01-29 IMAGING — CT CT ABD-PELV W/ CM
1 of 2 series · 15 of 32 positions shown, 19 images · non-contrast
Comparison: none

REASON FOR EXAM: (1) Left lower quadrat pain, nausea, diarrhea; (2) LLQ
pain, diarrhea
COMMENTS:

PROCEDURE:     CT  - CT ABDOMEN / PELVIS  W  - February 19, 2012  [DATE]
RESULT:     CT abdomen pelvis dated 02/19/2012. Comparison made to prior
study dated 01/30/2011.
TECHNIQUE: Helical 3 mm sections were obtained from the lung bases through
the pubic symphysis status post intravenous administration of 100 mL of
Psovue-YOO and oral contrast.

[Series 2: 3mm soft tissue · axial · 0.93mm/px · z∈[-986,-554]mm · 15 of 158 slices shown, 19 images]
[im 7/158  soft-tissue]
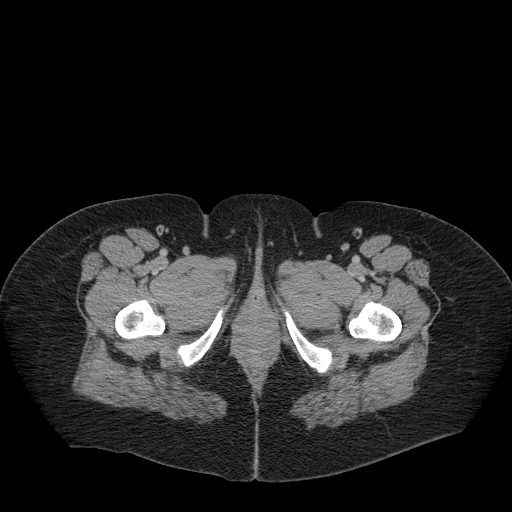
[im 7/158  bone]
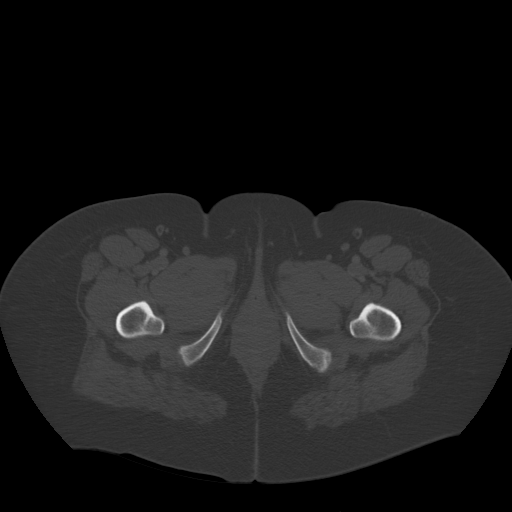
[im 19/158  soft-tissue]
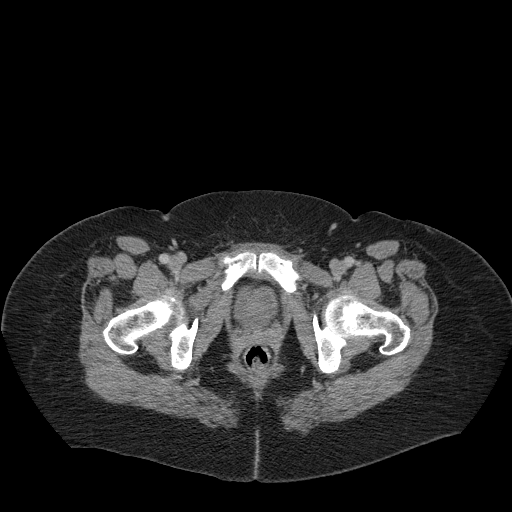
[im 32/158  soft-tissue]
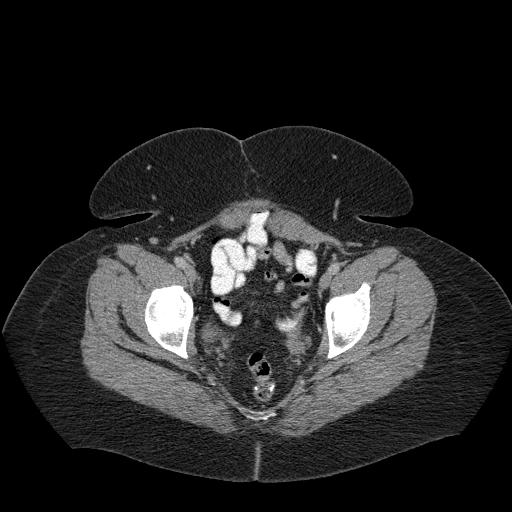
[im 44/158  soft-tissue]
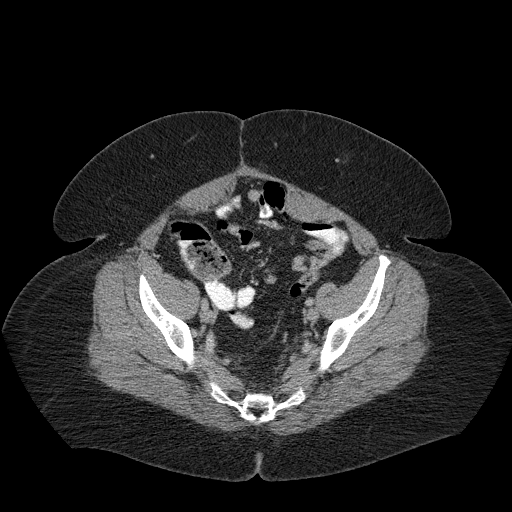
[im 57/158  soft-tissue]
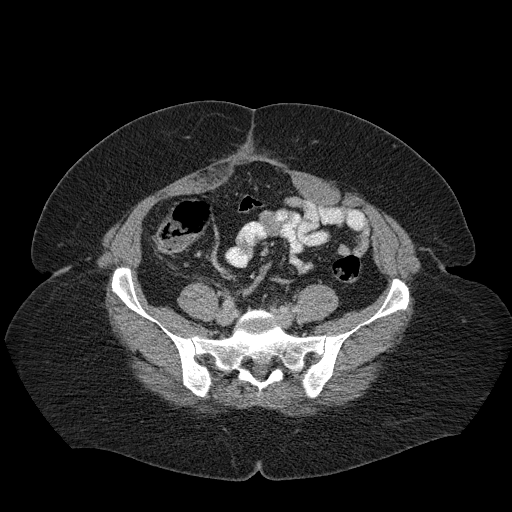
[im 70/158  soft-tissue]
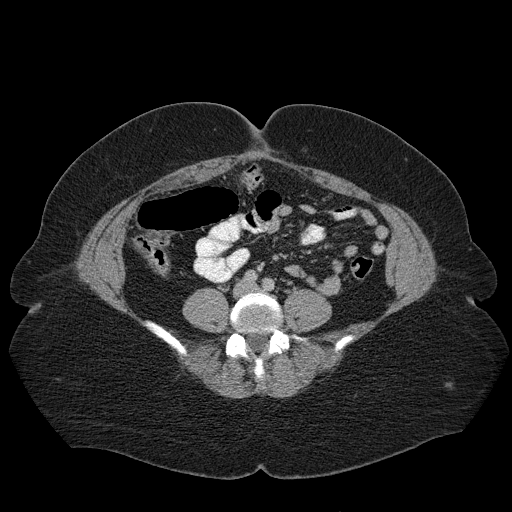
[im 82/158  soft-tissue]
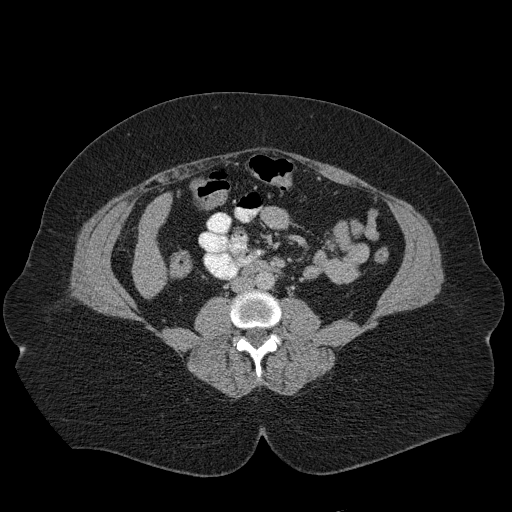
[im 88/158  soft-tissue]
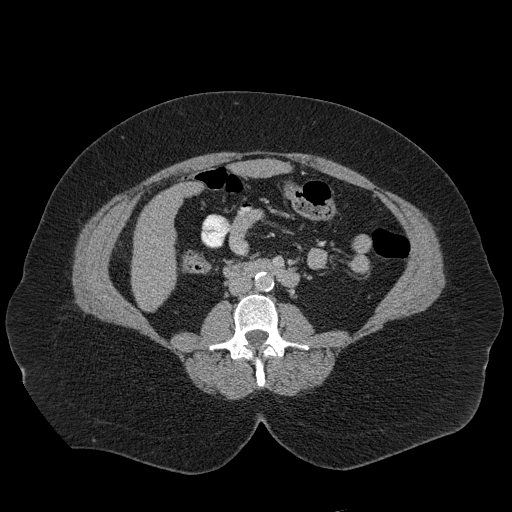
[im 101/158  soft-tissue]
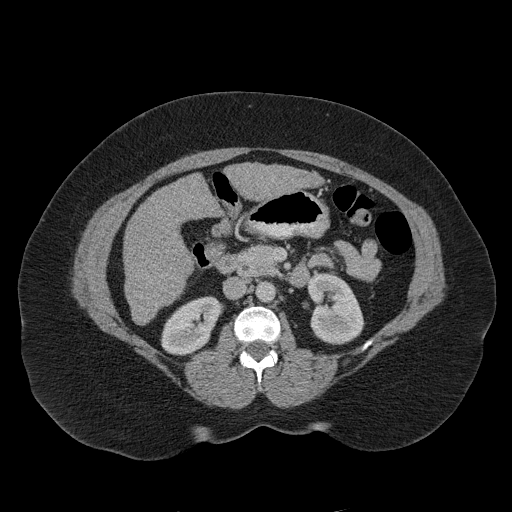
[im 101/158  bone]
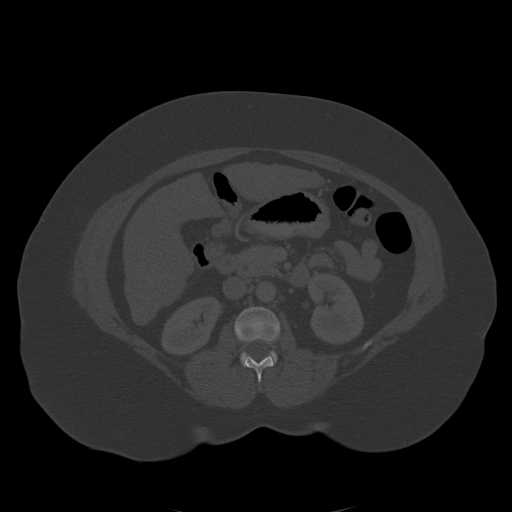
[im 114/158  soft-tissue]
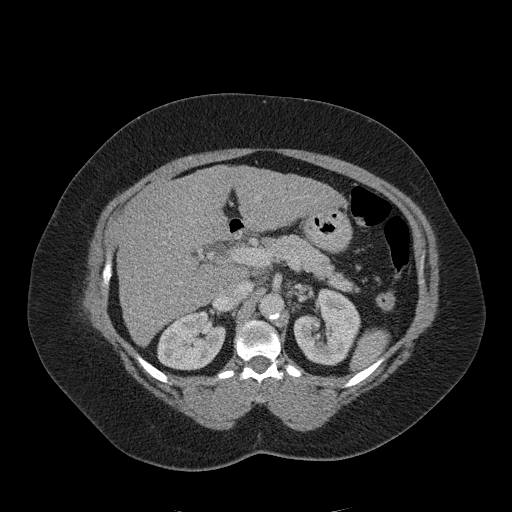
[im 126/158  soft-tissue]
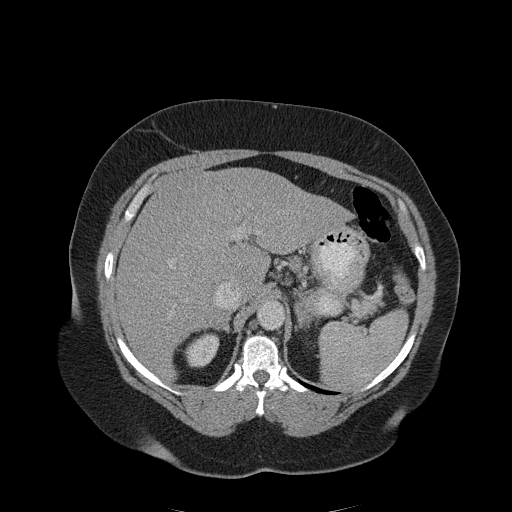
[im 132/158  lung]
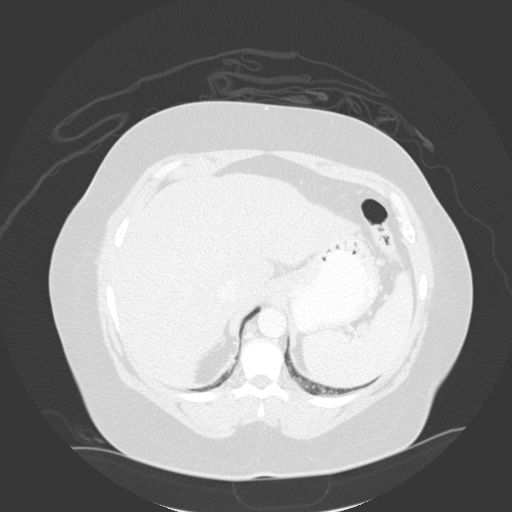
[im 139/158  soft-tissue]
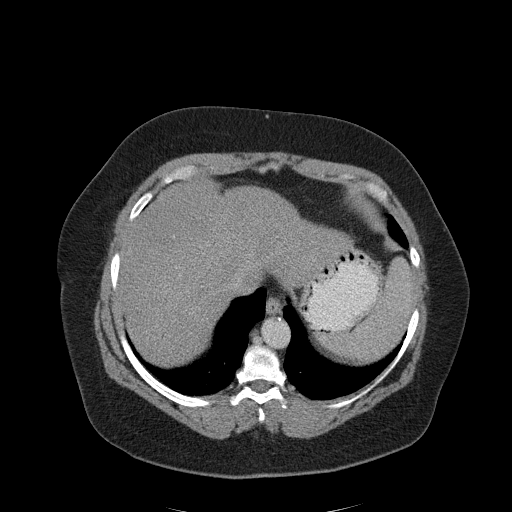
[im 139/158  lung]
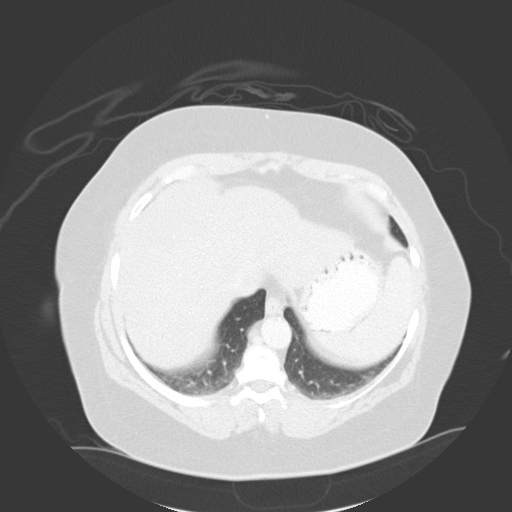
[im 145/158  lung]
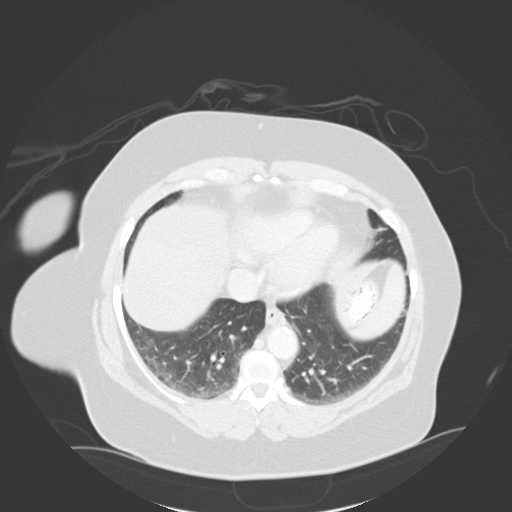
[im 151/158  soft-tissue]
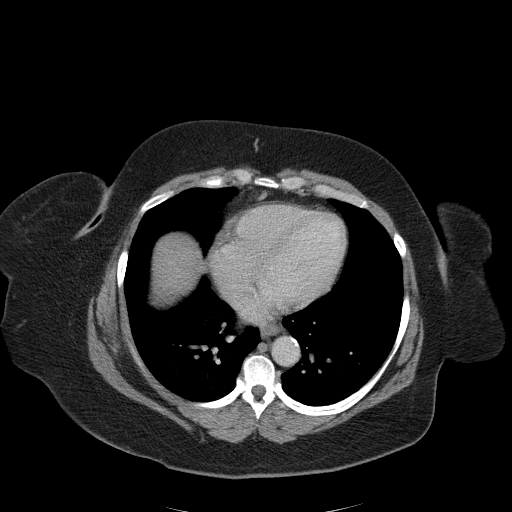
[im 151/158  lung]
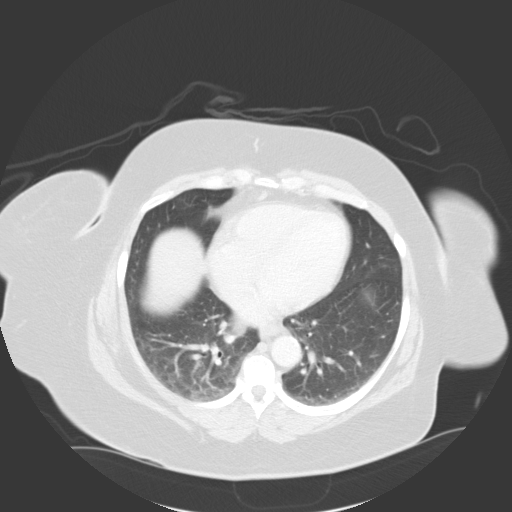

[15 of 32 positions shown; findings below may reference images not displayed]

FINDINGS: Hypoventilation is appreciated within the lung bases.

The liver demonstrates a fine nodular border as mildly atrophic. The spleen,
right adrenal, kidneys are unremarkable. Varices, mild, identified within
the splenic hilum. 1.73 cm nodule is identified involving the left adrenal,
lateral limb, appreciated on image #36.

Bases status post cholecystectomy. The pancreas is unremarkable.

There is no CT evidence of bowel obstruction, enteritis, colitis,
diverticulitis, nor appendicitis. A small fat containing umbilical hernia is
identified.
IMPRESSION: No CT evidence of obstructive or inflammatory abnormalities.
2. Findings the superior flecked the sequela of cirrhotic changes in the
liver and possibly component of early or mild portal venous hypertension.

## 2014-01-30 ENCOUNTER — Emergency Department: Payer: Self-pay | Admitting: Emergency Medicine

## 2014-01-30 LAB — CBC WITH DIFFERENTIAL/PLATELET
Basophil #: 0.1 10*3/uL (ref 0.0–0.1)
Basophil %: 0.8 %
Eosinophil #: 0.1 10*3/uL (ref 0.0–0.7)
Eosinophil %: 1.9 %
HCT: 36 % (ref 35.0–47.0)
HGB: 11.3 g/dL — ABNORMAL LOW (ref 12.0–16.0)
LYMPHS ABS: 3.3 10*3/uL (ref 1.0–3.6)
Lymphocyte %: 44 %
MCH: 26.5 pg (ref 26.0–34.0)
MCHC: 31.5 g/dL — AB (ref 32.0–36.0)
MCV: 84 fL (ref 80–100)
MONOS PCT: 11.3 %
Monocyte #: 0.8 x10 3/mm (ref 0.2–0.9)
Neutrophil #: 3.2 10*3/uL (ref 1.4–6.5)
Neutrophil %: 42 %
PLATELETS: 129 10*3/uL — AB (ref 150–440)
RBC: 4.27 10*6/uL (ref 3.80–5.20)
RDW: 17.6 % — ABNORMAL HIGH (ref 11.5–14.5)
WBC: 7.5 10*3/uL (ref 3.6–11.0)

## 2014-01-30 LAB — COMPREHENSIVE METABOLIC PANEL
ALBUMIN: 3.2 g/dL — AB (ref 3.4–5.0)
ALT: 27 U/L
Alkaline Phosphatase: 127 U/L — ABNORMAL HIGH
Anion Gap: 7 (ref 7–16)
BILIRUBIN TOTAL: 1 mg/dL (ref 0.2–1.0)
BUN: 10 mg/dL (ref 7–18)
CHLORIDE: 104 mmol/L (ref 98–107)
CO2: 26 mmol/L (ref 21–32)
Calcium, Total: 9.1 mg/dL (ref 8.5–10.1)
Creatinine: 1.42 mg/dL — ABNORMAL HIGH (ref 0.60–1.30)
EGFR (Non-African Amer.): 41 — ABNORMAL LOW
GFR CALC AF AMER: 48 — AB
Glucose: 91 mg/dL (ref 65–99)
OSMOLALITY: 272 (ref 275–301)
POTASSIUM: 3.5 mmol/L (ref 3.5–5.1)
SGOT(AST): 39 U/L — ABNORMAL HIGH (ref 15–37)
SODIUM: 137 mmol/L (ref 136–145)
Total Protein: 9.1 g/dL — ABNORMAL HIGH (ref 6.4–8.2)

## 2014-01-30 LAB — TROPONIN I

## 2014-01-31 LAB — URINALYSIS, COMPLETE
Bacteria: NONE SEEN
Bilirubin,UR: NEGATIVE
Blood: NEGATIVE
Glucose,UR: NEGATIVE mg/dL (ref 0–75)
Leukocyte Esterase: NEGATIVE
Nitrite: NEGATIVE
PROTEIN: NEGATIVE
Ph: 5 (ref 4.5–8.0)
SPECIFIC GRAVITY: 1.013 (ref 1.003–1.030)

## 2014-01-31 LAB — LIPASE, BLOOD: Lipase: 142 U/L (ref 73–393)

## 2014-02-01 IMAGING — CR DG ABDOMEN 3V
1 series · 5 of 5 positions shown · non-contrast
Comparison: none

REASON FOR EXAM: abdomenal distention
COMMENTS:

PROCEDURE:     DXR - DXR ABDOMEN COMPLETE  - February 22, 2012 [DATE]
RESULT:     Comparisons:  None

[Series 1: erect ap · 0.17mm/px · 5 of 5 slices shown]
[im 1/5]
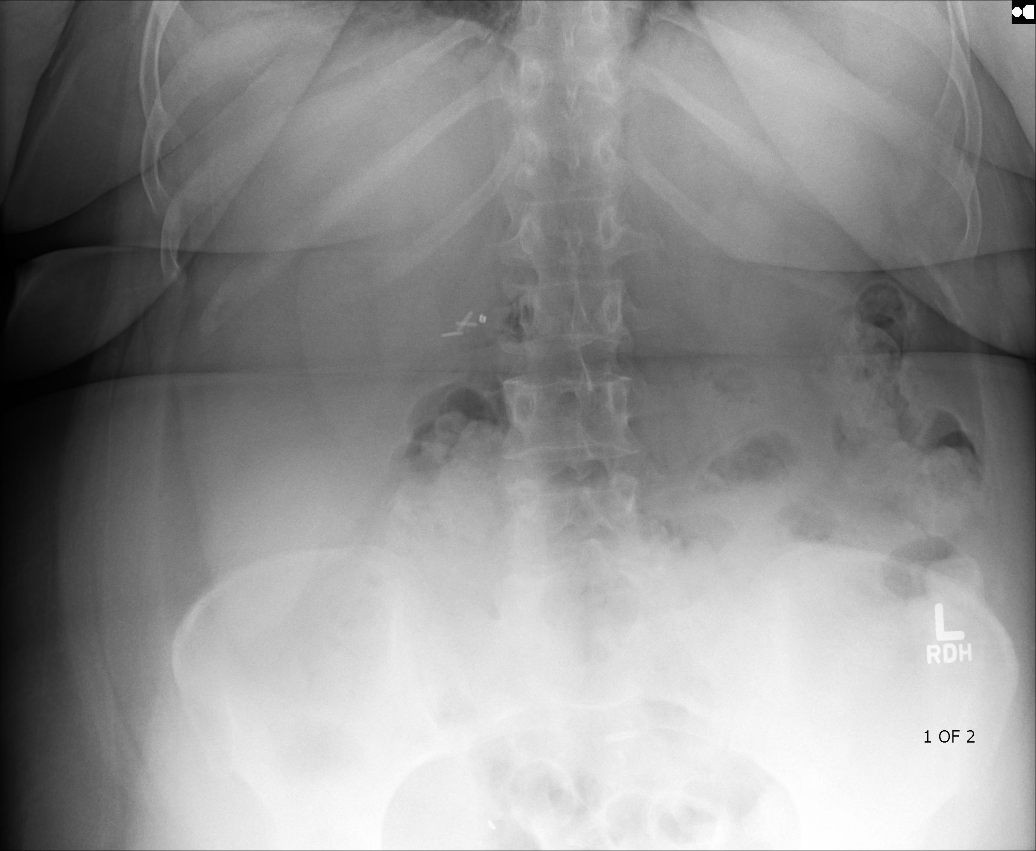
[im 2/5]
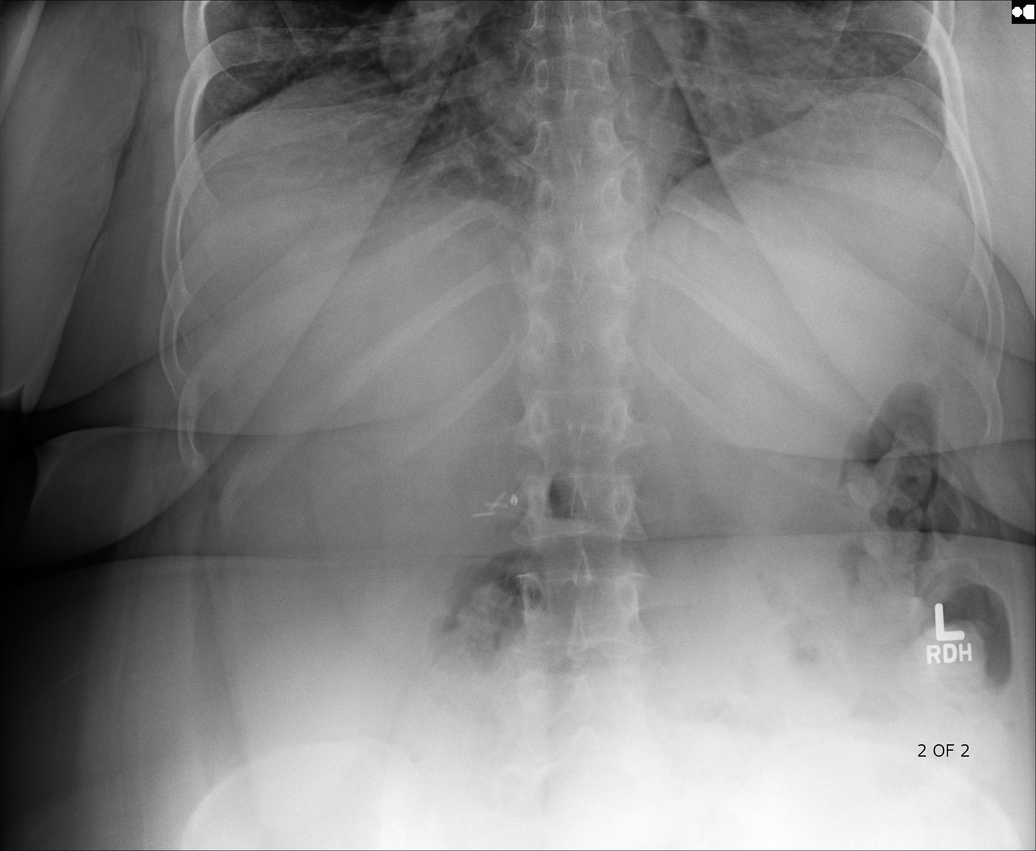
[im 3/5]
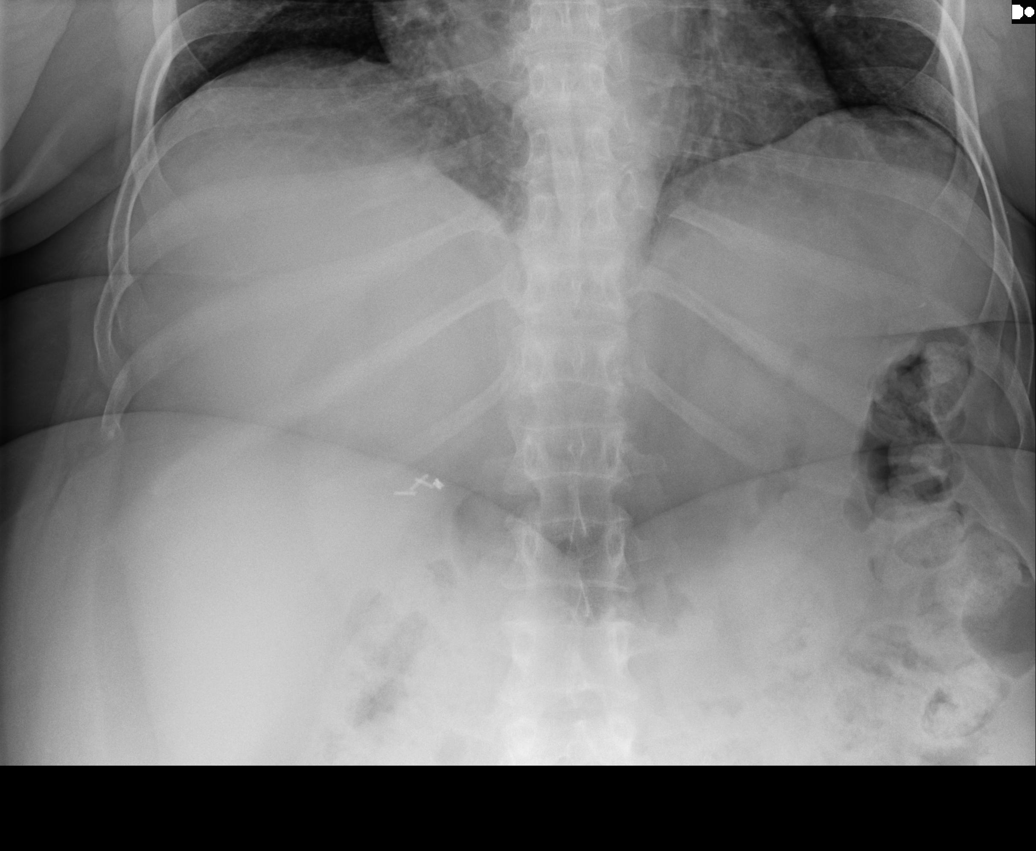
[im 4/5]
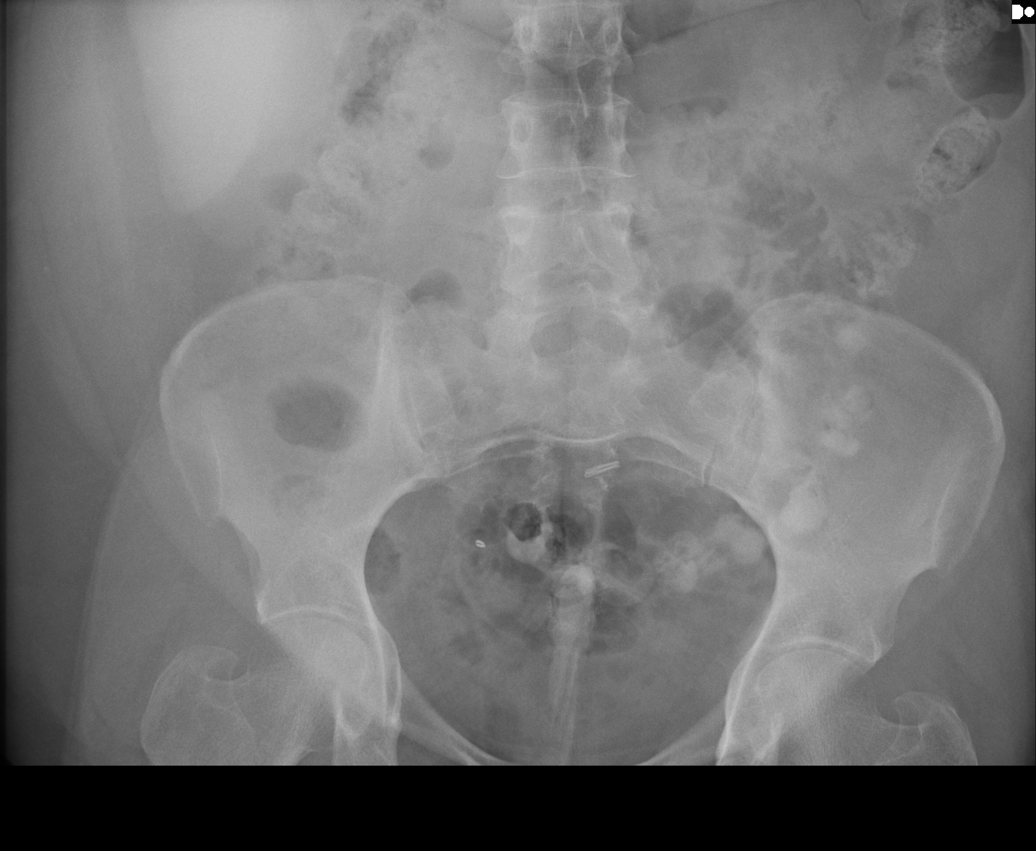
[im 5/5]
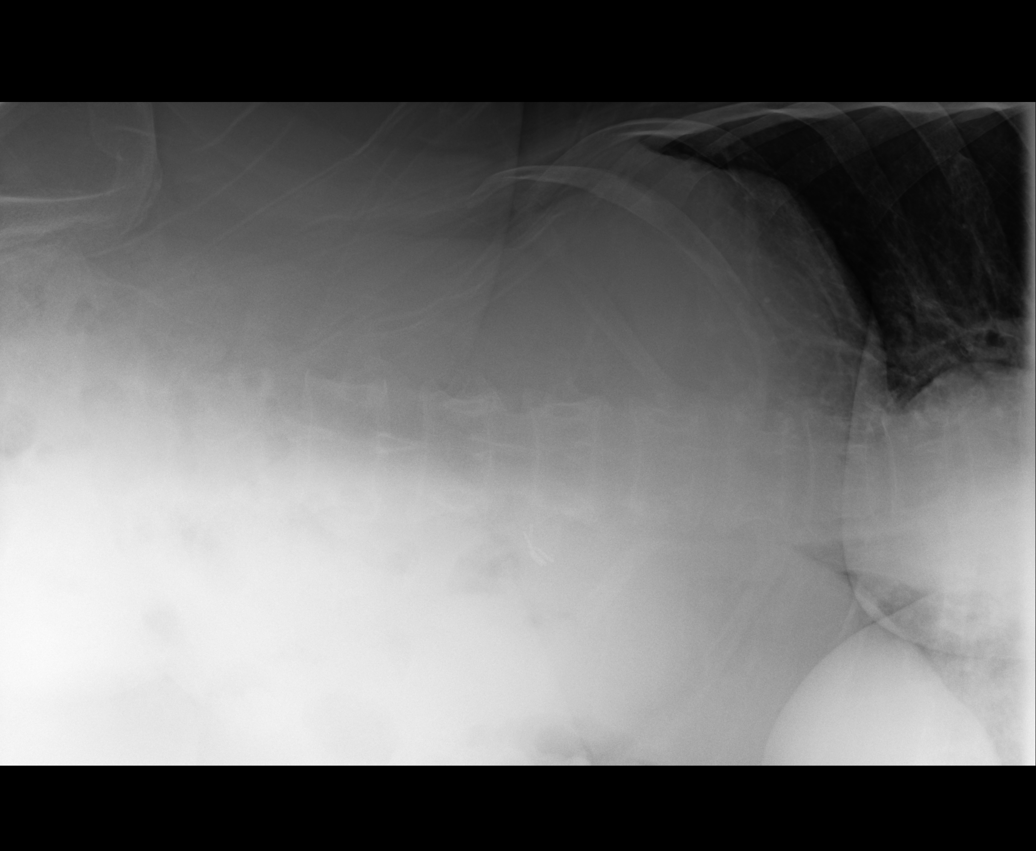

[5 of 5 positions shown; findings below may reference images not displayed]

FINDINGS: Supine and upright views of the abdomen are provided.

There is a nonspecific bowel gas pattern. There is no bowel dilatation to
suggest obstruction. There are no air-fluid levels. There is no pathologic
calcification along the expected course of the ureters. There is no evidence
of pneumoperitoneum, portal venous gas, or pneumatosis.

The osseous structures are unremarkable.
IMPRESSION: Unremarkable abdominal radiograph.

[REDACTED]

## 2014-02-11 ENCOUNTER — Encounter: Payer: Self-pay | Admitting: Podiatry

## 2014-02-11 ENCOUNTER — Ambulatory Visit (INDEPENDENT_AMBULATORY_CARE_PROVIDER_SITE_OTHER): Payer: Medicare Other | Admitting: Podiatry

## 2014-02-11 ENCOUNTER — Encounter: Payer: Self-pay | Admitting: *Deleted

## 2014-02-11 ENCOUNTER — Other Ambulatory Visit: Payer: Self-pay | Admitting: *Deleted

## 2014-02-11 VITALS — BP 102/65 | HR 94 | Resp 16

## 2014-02-11 DIAGNOSIS — M79609 Pain in unspecified limb: Secondary | ICD-10-CM

## 2014-02-11 DIAGNOSIS — M79676 Pain in unspecified toe(s): Secondary | ICD-10-CM

## 2014-02-11 DIAGNOSIS — B351 Tinea unguium: Secondary | ICD-10-CM

## 2014-02-11 NOTE — Progress Notes (Signed)
Subjective:     Patient ID: [name], female   DOB: Sep 30, 1955, 59 y.o.   MRN: [id number]  HPI patient states I'm in poor health and I need my nails trimmed as they get sore and I cannot do it myself   Review of Systems     Objective:   Physical Exam Long-term diabetic neurovascular status unchanged with thick yellow incurvated brittle toenails 1-5 both feet that are painful    Assessment:     Mycotic nail infection with pain 1-5 both feet    Plan:     Debris painful nailbeds 1-5 both feet with no iatrogenic bleeding noted

## 2014-02-22 ENCOUNTER — Emergency Department: Payer: Self-pay | Admitting: Emergency Medicine

## 2014-02-22 LAB — URINALYSIS, COMPLETE
Bilirubin,UR: NEGATIVE
Blood: NEGATIVE
Glucose,UR: NEGATIVE mg/dL (ref 0–75)
Ketone: NEGATIVE
Leukocyte Esterase: NEGATIVE
NITRITE: NEGATIVE
PROTEIN: NEGATIVE
Ph: 6 (ref 4.5–8.0)
RBC,UR: 1 /HPF (ref 0–5)
Specific Gravity: 1.004 (ref 1.003–1.030)
Squamous Epithelial: 2

## 2014-02-22 LAB — COMPREHENSIVE METABOLIC PANEL
ALK PHOS: 110 U/L
AST: 40 U/L — AB (ref 15–37)
Albumin: 3.4 g/dL (ref 3.4–5.0)
Anion Gap: 12 (ref 7–16)
BUN: 6 mg/dL — ABNORMAL LOW (ref 7–18)
Bilirubin,Total: 1 mg/dL (ref 0.2–1.0)
CHLORIDE: 106 mmol/L (ref 98–107)
Calcium, Total: 9.1 mg/dL (ref 8.5–10.1)
Co2: 24 mmol/L (ref 21–32)
Creatinine: 1 mg/dL (ref 0.60–1.30)
Glucose: 74 mg/dL (ref 65–99)
Osmolality: 279 (ref 275–301)
Potassium: 3.4 mmol/L — ABNORMAL LOW (ref 3.5–5.1)
SGPT (ALT): 24 U/L
Sodium: 142 mmol/L (ref 136–145)
Total Protein: 8.7 g/dL — ABNORMAL HIGH (ref 6.4–8.2)

## 2014-02-22 LAB — CBC
HCT: 35.8 % (ref 35.0–47.0)
HGB: 11.1 g/dL — ABNORMAL LOW (ref 12.0–16.0)
MCH: 26.1 pg (ref 26.0–34.0)
MCHC: 31 g/dL — ABNORMAL LOW (ref 32.0–36.0)
MCV: 84 fL (ref 80–100)
PLATELETS: 101 10*3/uL — AB (ref 150–440)
RBC: 4.25 10*6/uL (ref 3.80–5.20)
RDW: 17.5 % — ABNORMAL HIGH (ref 11.5–14.5)
WBC: 5.5 10*3/uL (ref 3.6–11.0)

## 2014-02-22 LAB — LIPASE, BLOOD: Lipase: 95 U/L (ref 73–393)

## 2014-02-25 LAB — URINE CULTURE

## 2014-02-26 ENCOUNTER — Emergency Department: Payer: Self-pay | Admitting: Emergency Medicine

## 2014-03-07 IMAGING — CT CT ABD-PELV W/ CM
1 of 2 series · 15 of 32 positions shown, 19 images · non-contrast
Comparison: none

REASON FOR EXAM: (1) lower abd pain, heme pos stool; (2) lower abd pain,
COMMENTS:

[Series 2: 3mm soft tissue · axial · 0.89mm/px · z∈[-386,+55]mm · 15 of 161 slices shown, 19 images]
[im 7/161  soft-tissue]
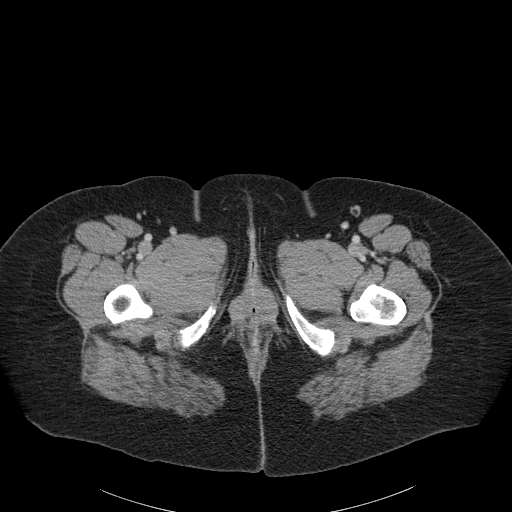
[im 7/161  bone]
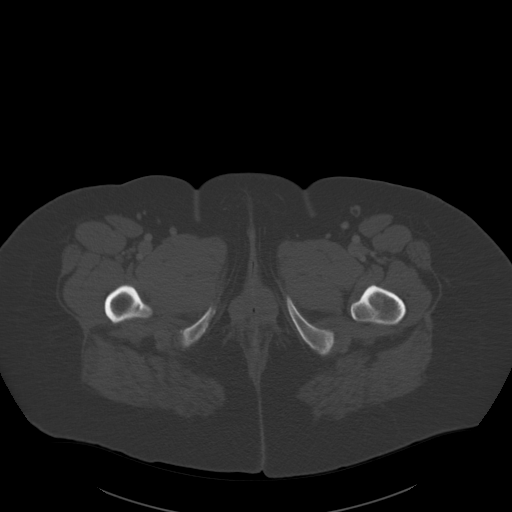
[im 21/161  soft-tissue]
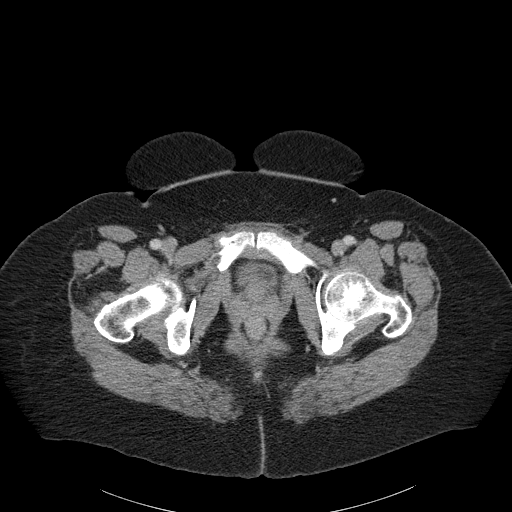
[im 34/161  soft-tissue]
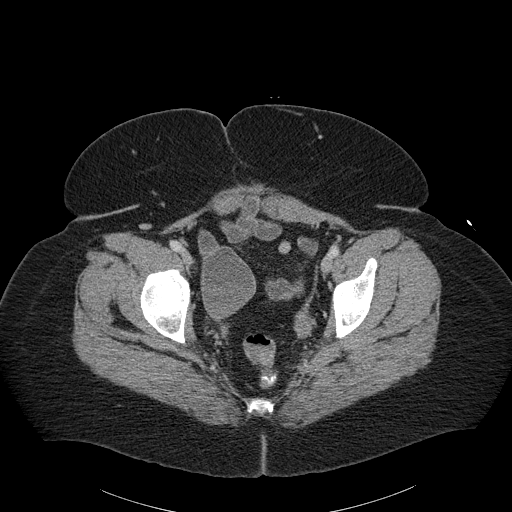
[im 47/161  soft-tissue]
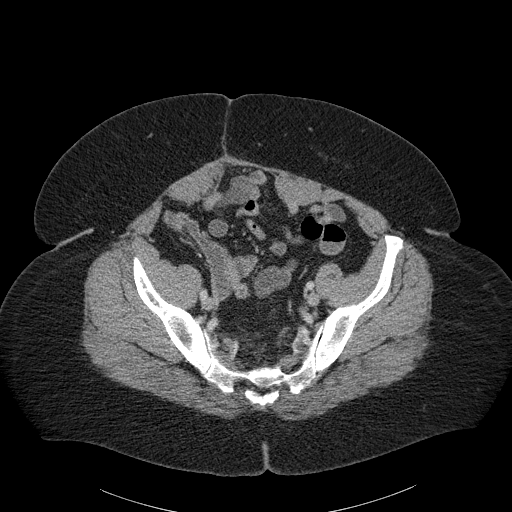
[im 54/161  soft-tissue]
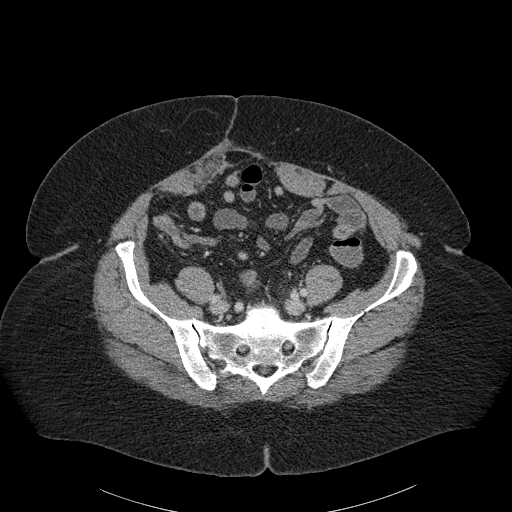
[im 67/161  soft-tissue]
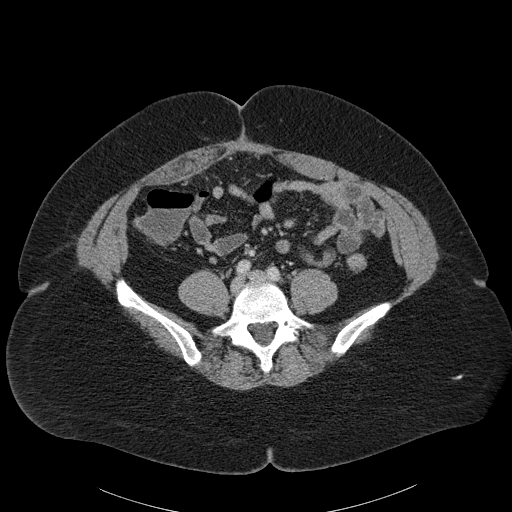
[im 81/161  soft-tissue]
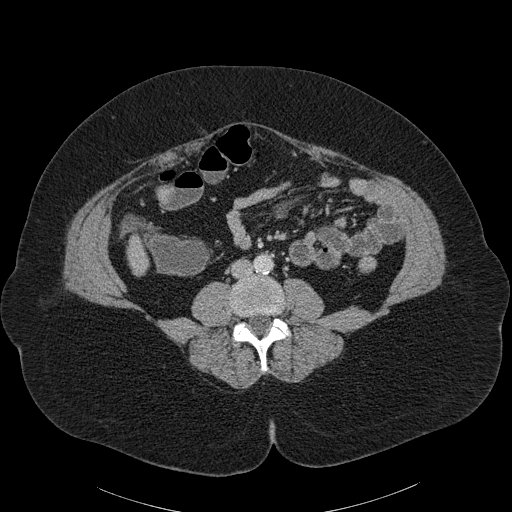
[im 94/161  soft-tissue]
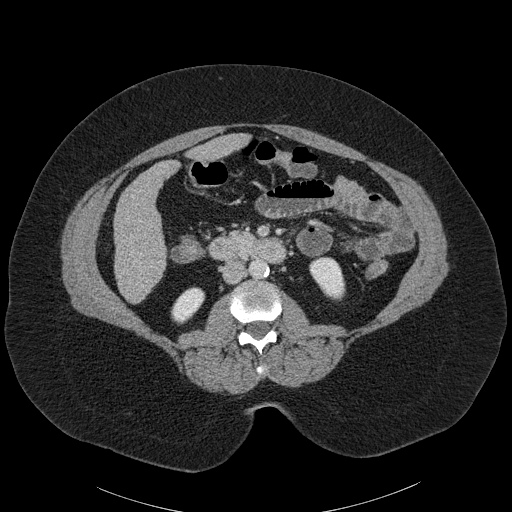
[im 107/161  soft-tissue]
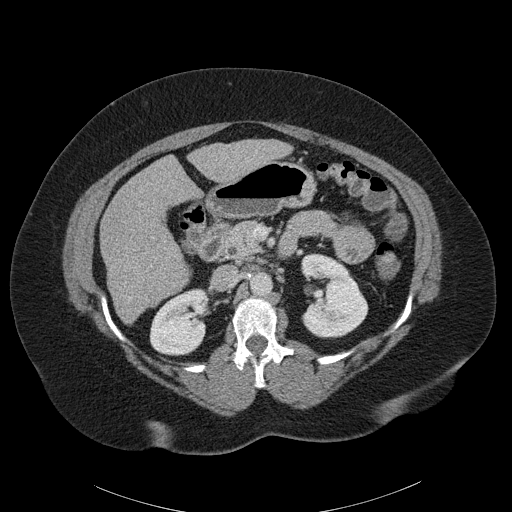
[im 107/161  bone]
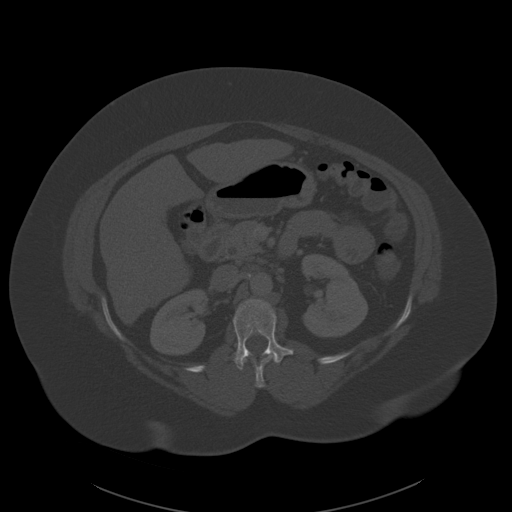
[im 114/161  soft-tissue]
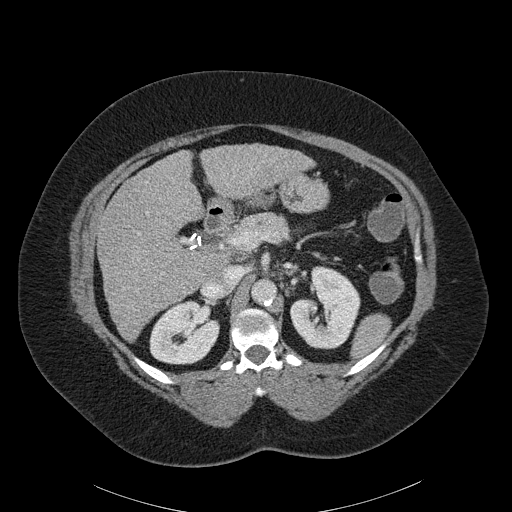
[im 127/161  soft-tissue]
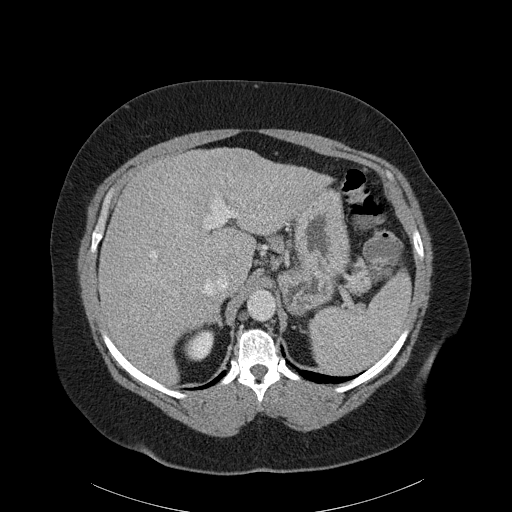
[im 134/161  lung]
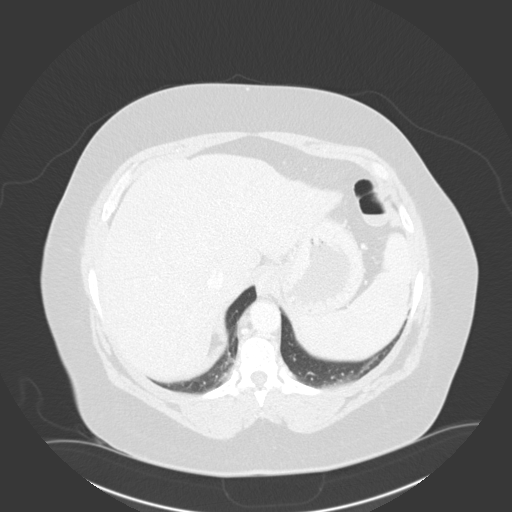
[im 141/161  soft-tissue]
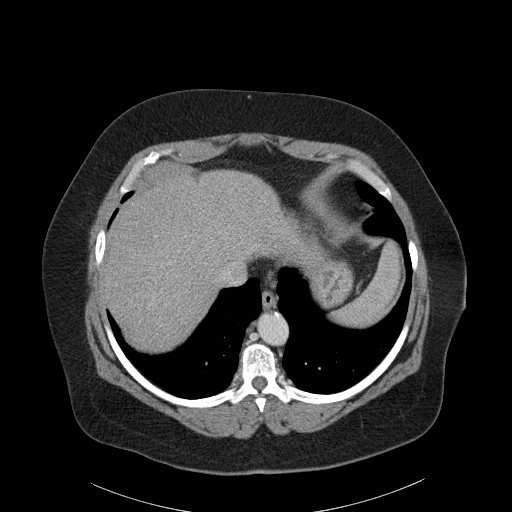
[im 141/161  lung]
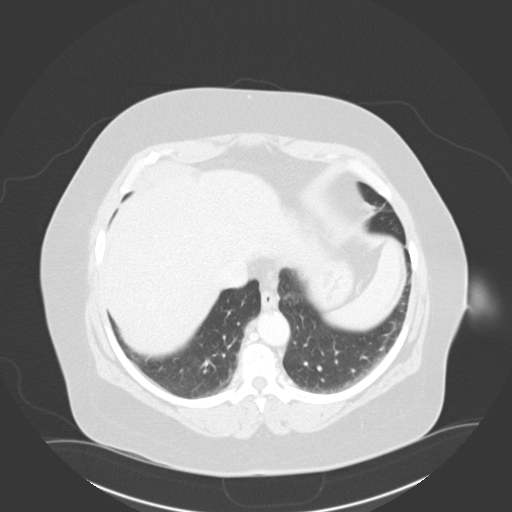
[im 147/161  lung]
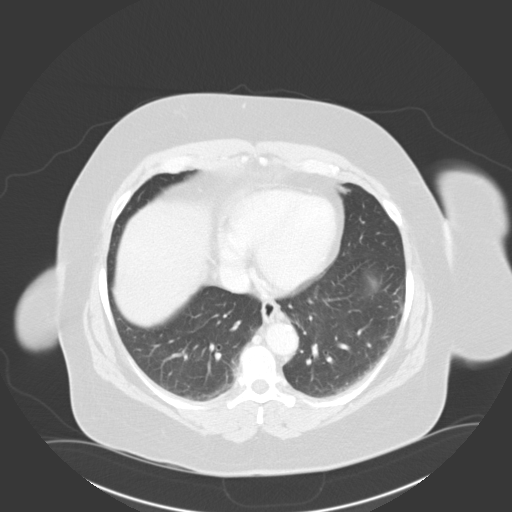
[im 154/161  soft-tissue]
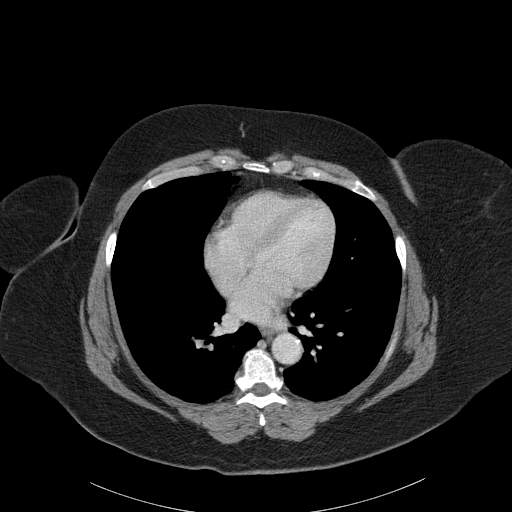
[im 154/161  lung]
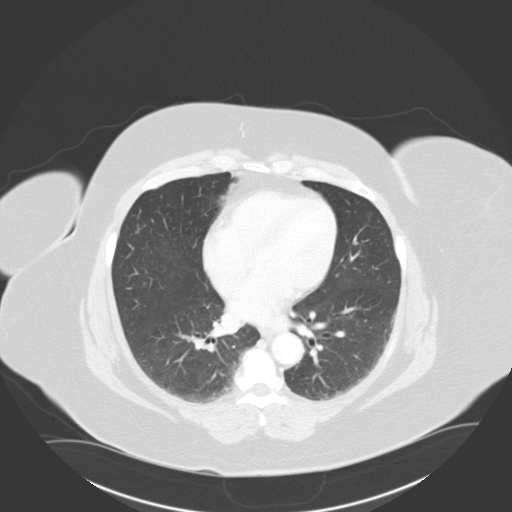

[15 of 32 positions shown; findings below may reference images not displayed]

PROCEDURE:     CT  - CT ABDOMEN / PELVIS  W  - March 27, 2012  [DATE]

RESULT:     CT of the abdomen and pelvis is performed with 100 mL of
Osovue-XDQ iodinated intravenous contrast. No oral contrast is utilized.
Images are reconstructed at 3.0 mm slice thickness in the axial plane and
compared to the images from 19 February, 2012.

Images through the lung bases demonstrate dependent atelectasis. The liver
shows some minimal peripheral nodularity as demonstrated previously.
Cholecystectomy clips are present. Minimal varices are present in the
splenic hilum. There is no hiatal hernia. There is a stable nodular density
of 1.5 cm and the left adrenal gland. The kidneys, spleen and pancreas
appear normal. The common bile duct is mildly prominent which can be normal
in postcholecystectomy patient. There is no pleural effusion or ascites
evident. There is no adenopathy. Appears the patient is status post
hysterectomy. No bowel wall thickening or abnormal bowel distention is
evident. No inflammatory stranding is seen. There are a few air-fluid levels
with a slightly prominent loop of small bowel in the left midabdomen. There
is no free air.
IMPRESSION: 1. No acute inflammatory stranding, evidence of adenopathy or mass. Possible
mild cirrhotic changes in the liver. Stable splenic hilar varices which
could represent early portal hypertension. Stable left adrenal nodule.

[REDACTED]

## 2014-03-19 ENCOUNTER — Emergency Department: Payer: Self-pay | Admitting: Emergency Medicine

## 2014-03-19 LAB — ETHANOL: Ethanol: <3 mg/dL

## 2014-03-19 LAB — ACETAMINOPHEN LEVEL

## 2014-03-19 LAB — COMPREHENSIVE METABOLIC PANEL
ALK PHOS: 108 U/L
Albumin: 3.1 g/dL — ABNORMAL LOW (ref 3.4–5.0)
Anion Gap: 13 (ref 7–16)
BILIRUBIN TOTAL: 1 mg/dL (ref 0.2–1.0)
BUN: 8 mg/dL (ref 7–18)
CREATININE: 0.9 mg/dL (ref 0.60–1.30)
Calcium, Total: 9 mg/dL (ref 8.5–10.1)
Chloride: 109 mmol/L — ABNORMAL HIGH (ref 98–107)
Co2: 19 mmol/L — ABNORMAL LOW (ref 21–32)
EGFR (African American): >60
EGFR (Non-African Amer.): >60
GLUCOSE: 119 mg/dL — AB (ref 65–99)
Osmolality: 281 (ref 275–301)
Potassium: 3.7 mmol/L (ref 3.5–5.1)
SGOT(AST): 62 U/L — ABNORMAL HIGH (ref 15–37)
SGPT (ALT): 24 U/L
SODIUM: 141 mmol/L (ref 136–145)
Total Protein: 8.6 g/dL — ABNORMAL HIGH (ref 6.4–8.2)

## 2014-03-19 LAB — AMMONIA: AMMONIA, PLASMA: 67 umol/L — AB (ref 11–32)

## 2014-03-19 LAB — CBC
HCT: 37.8 % (ref 35.0–47.0)
HGB: 12 g/dL (ref 12.0–16.0)
MCH: 26.6 pg (ref 26.0–34.0)
MCHC: 31.8 g/dL — ABNORMAL LOW (ref 32.0–36.0)
MCV: 84 fL (ref 80–100)
Platelet: 128 10*3/uL — ABNORMAL LOW (ref 150–440)
RBC: 4.52 10*6/uL (ref 3.80–5.20)
RDW: 17.4 % — AB (ref 11.5–14.5)
WBC: 7.2 10*3/uL (ref 3.6–11.0)

## 2014-03-19 LAB — URINALYSIS, COMPLETE
Bilirubin,UR: NEGATIVE
GLUCOSE, UR: NEGATIVE mg/dL (ref 0–75)
KETONE: NEGATIVE
Nitrite: NEGATIVE
Ph: 5 (ref 4.5–8.0)
Protein: NEGATIVE
RBC,UR: 2 /HPF (ref 0–5)
Specific Gravity: 1.005 (ref 1.003–1.030)
Squamous Epithelial: 2
WBC UR: 1 /HPF (ref 0–5)

## 2014-03-19 LAB — PHENYTOIN LEVEL, TOTAL

## 2014-03-19 LAB — PROTIME-INR
INR: 1.3
Prothrombin Time: 15.6 secs — ABNORMAL HIGH (ref 11.5–14.7)

## 2014-03-19 LAB — TROPONIN I

## 2014-03-19 LAB — SALICYLATE LEVEL: SALICYLATES, SERUM: 2.3 mg/dL

## 2014-03-19 LAB — LIPASE, BLOOD: Lipase: 104 U/L (ref 73–393)

## 2014-03-29 ENCOUNTER — Emergency Department: Payer: Self-pay | Admitting: Emergency Medicine

## 2014-03-30 LAB — CBC WITH DIFFERENTIAL/PLATELET
Basophil #: 0 10*3/uL (ref 0.0–0.1)
Basophil %: 0.5 %
EOS ABS: 0.1 10*3/uL (ref 0.0–0.7)
EOS PCT: 2.5 %
HCT: 33.6 % — ABNORMAL LOW (ref 35.0–47.0)
HGB: 10.3 g/dL — AB (ref 12.0–16.0)
LYMPHS ABS: 2.5 10*3/uL (ref 1.0–3.6)
LYMPHS PCT: 43.1 %
MCH: 25.6 pg — AB (ref 26.0–34.0)
MCHC: 30.7 g/dL — ABNORMAL LOW (ref 32.0–36.0)
MCV: 84 fL (ref 80–100)
Monocyte #: 0.6 x10 3/mm (ref 0.2–0.9)
Monocyte %: 10.2 %
NEUTROS PCT: 43.7 %
Neutrophil #: 2.6 10*3/uL (ref 1.4–6.5)
Platelet: 114 10*3/uL — ABNORMAL LOW (ref 150–440)
RBC: 4.02 10*6/uL (ref 3.80–5.20)
RDW: 17.5 % — AB (ref 11.5–14.5)
WBC: 5.9 10*3/uL (ref 3.6–11.0)

## 2014-03-30 LAB — COMPREHENSIVE METABOLIC PANEL
ALK PHOS: 110 U/L
ANION GAP: 8 (ref 7–16)
AST: 28 U/L (ref 15–37)
Albumin: 3.1 g/dL — ABNORMAL LOW (ref 3.4–5.0)
BUN: 7 mg/dL (ref 7–18)
Bilirubin,Total: 0.8 mg/dL (ref 0.2–1.0)
CALCIUM: 8.2 mg/dL — AB (ref 8.5–10.1)
Chloride: 107 mmol/L (ref 98–107)
Co2: 25 mmol/L (ref 21–32)
Creatinine: 1.1 mg/dL (ref 0.60–1.30)
EGFR (Non-African Amer.): 56 — ABNORMAL LOW
GLUCOSE: 131 mg/dL — AB (ref 65–99)
Osmolality: 279 (ref 275–301)
Potassium: 3.2 mmol/L — ABNORMAL LOW (ref 3.5–5.1)
SGPT (ALT): 25 U/L
Sodium: 140 mmol/L (ref 136–145)
Total Protein: 8.1 g/dL (ref 6.4–8.2)

## 2014-03-30 LAB — URINALYSIS, COMPLETE
BLOOD: NEGATIVE
Bilirubin,UR: NEGATIVE
GLUCOSE, UR: NEGATIVE mg/dL (ref 0–75)
Ketone: NEGATIVE
LEUKOCYTE ESTERASE: NEGATIVE
Nitrite: NEGATIVE
Ph: 6 (ref 4.5–8.0)
Protein: NEGATIVE
RBC,UR: NONE SEEN /HPF (ref 0–5)
Specific Gravity: 1.003 (ref 1.003–1.030)
Squamous Epithelial: 7
WBC UR: 1 /HPF (ref 0–5)

## 2014-03-30 LAB — PROTIME-INR
INR: 1.4
Prothrombin Time: 16.7 secs — ABNORMAL HIGH (ref 11.5–14.7)

## 2014-03-30 LAB — AMMONIA: AMMONIA, PLASMA: 72 umol/L — AB (ref 11–32)

## 2014-03-30 LAB — DRUG SCREEN, URINE
Amphetamines, Ur Screen: NEGATIVE (ref ?–1000)
Barbiturates, Ur Screen: NEGATIVE (ref ?–200)
Benzodiazepine, Ur Scrn: NEGATIVE (ref ?–200)
CANNABINOID 50 NG, UR ~~LOC~~: NEGATIVE (ref ?–50)
COCAINE METABOLITE, UR ~~LOC~~: NEGATIVE (ref ?–300)
MDMA (Ecstasy)Ur Screen: NEGATIVE (ref ?–500)
Methadone, Ur Screen: NEGATIVE (ref ?–300)
Opiate, Ur Screen: NEGATIVE (ref ?–300)
Phencyclidine (PCP) Ur S: NEGATIVE (ref ?–25)
Tricyclic, Ur Screen: POSITIVE (ref ?–1000)

## 2014-03-31 ENCOUNTER — Emergency Department: Payer: Self-pay | Admitting: Emergency Medicine

## 2014-03-31 LAB — CBC
HCT: 34.7 % — ABNORMAL LOW (ref 35.0–47.0)
HGB: 11.1 g/dL — AB (ref 12.0–16.0)
MCH: 26.5 pg (ref 26.0–34.0)
MCHC: 32 g/dL (ref 32.0–36.0)
MCV: 83 fL (ref 80–100)
PLATELETS: 127 10*3/uL — AB (ref 150–440)
RBC: 4.19 10*6/uL (ref 3.80–5.20)
RDW: 17.8 % — ABNORMAL HIGH (ref 11.5–14.5)
WBC: 6.2 10*3/uL (ref 3.6–11.0)

## 2014-03-31 LAB — DIFFERENTIAL
BASOS ABS: 0 10*3/uL (ref 0.0–0.1)
BASOS PCT: 0.4 %
EOS PCT: 2.4 %
Eosinophil #: 0.2 10*3/uL (ref 0.0–0.7)
Lymphocyte #: 2.6 10*3/uL (ref 1.0–3.6)
Lymphocyte %: 40.4 %
MONO ABS: 0.6 x10 3/mm (ref 0.2–0.9)
Monocyte %: 9.8 %
NEUTROS PCT: 47 %
Neutrophil #: 3 10*3/uL (ref 1.4–6.5)

## 2014-03-31 LAB — COMPREHENSIVE METABOLIC PANEL
ANION GAP: 5 — AB (ref 7–16)
Albumin: 3.2 g/dL — ABNORMAL LOW (ref 3.4–5.0)
Alkaline Phosphatase: 119 U/L — ABNORMAL HIGH
BUN: 6 mg/dL — AB (ref 7–18)
Bilirubin,Total: 1.1 mg/dL — ABNORMAL HIGH (ref 0.2–1.0)
Calcium, Total: 8.7 mg/dL (ref 8.5–10.1)
Chloride: 108 mmol/L — ABNORMAL HIGH (ref 98–107)
Co2: 28 mmol/L (ref 21–32)
Creatinine: 1.02 mg/dL (ref 0.60–1.30)
EGFR (Non-African Amer.): 60 — ABNORMAL LOW
Glucose: 100 mg/dL — ABNORMAL HIGH (ref 65–99)
Osmolality: 279 (ref 275–301)
Potassium: 3.4 mmol/L — ABNORMAL LOW (ref 3.5–5.1)
SGOT(AST): 44 U/L — ABNORMAL HIGH (ref 15–37)
SGPT (ALT): 26 U/L
Sodium: 141 mmol/L (ref 136–145)
Total Protein: 8.3 g/dL — ABNORMAL HIGH (ref 6.4–8.2)

## 2014-03-31 LAB — LIPASE, BLOOD: Lipase: 101 U/L (ref 73–393)

## 2014-03-31 LAB — AMMONIA: AMMONIA, PLASMA: 45 umol/L — AB (ref 11–32)

## 2014-03-31 LAB — PHENYTOIN LEVEL, TOTAL: Dilantin: <0.4 ug/mL — ABNORMAL LOW (ref 10.0–20.0)

## 2014-03-31 LAB — TROPONIN I: Troponin-I: <0.02 ng/mL

## 2014-04-01 LAB — URINE CULTURE

## 2014-04-07 ENCOUNTER — Other Ambulatory Visit: Payer: PRIVATE HEALTH INSURANCE

## 2014-04-09 ENCOUNTER — Emergency Department: Payer: Self-pay | Admitting: Emergency Medicine

## 2014-04-10 LAB — COMPREHENSIVE METABOLIC PANEL
ALK PHOS: 121 U/L — AB
Albumin: 3.4 g/dL (ref 3.4–5.0)
Anion Gap: 9 (ref 7–16)
BUN: 8 mg/dL (ref 7–18)
Bilirubin,Total: 0.8 mg/dL (ref 0.2–1.0)
Calcium, Total: 8.9 mg/dL (ref 8.5–10.1)
Chloride: 110 mmol/L — ABNORMAL HIGH (ref 98–107)
Co2: 23 mmol/L (ref 21–32)
Creatinine: 0.91 mg/dL (ref 0.60–1.30)
EGFR (African American): >60
EGFR (Non-African Amer.): >60
GLUCOSE: 90 mg/dL (ref 65–99)
OSMOLALITY: 281 (ref 275–301)
POTASSIUM: 4 mmol/L (ref 3.5–5.1)
SGOT(AST): 51 U/L — ABNORMAL HIGH (ref 15–37)
SGPT (ALT): 29 U/L
Sodium: 142 mmol/L (ref 136–145)
TOTAL PROTEIN: 8.4 g/dL — AB (ref 6.4–8.2)

## 2014-04-10 LAB — URINALYSIS, COMPLETE
Bacteria: NONE SEEN
Bilirubin,UR: NEGATIVE
Blood: NEGATIVE
Glucose,UR: NEGATIVE mg/dL (ref 0–75)
KETONE: NEGATIVE
LEUKOCYTE ESTERASE: NEGATIVE
NITRITE: NEGATIVE
PROTEIN: NEGATIVE
Ph: 6 (ref 4.5–8.0)
RBC,UR: <1 /HPF (ref 0–5)
Specific Gravity: 1.003 (ref 1.003–1.030)
Squamous Epithelial: <1

## 2014-04-10 LAB — CBC
HCT: 34 % — ABNORMAL LOW (ref 35.0–47.0)
HGB: 10.7 g/dL — ABNORMAL LOW (ref 12.0–16.0)
MCH: 26 pg (ref 26.0–34.0)
MCHC: 31.6 g/dL — ABNORMAL LOW (ref 32.0–36.0)
MCV: 82 fL (ref 80–100)
PLATELETS: 126 10*3/uL — AB (ref 150–440)
RBC: 4.13 10*6/uL (ref 3.80–5.20)
RDW: 17.6 % — ABNORMAL HIGH (ref 11.5–14.5)
WBC: 5.4 10*3/uL (ref 3.6–11.0)

## 2014-04-10 LAB — PHENYTOIN LEVEL, TOTAL: Dilantin: <0.4 ug/mL — ABNORMAL LOW (ref 10.0–20.0)

## 2014-04-10 LAB — LIPASE, BLOOD: Lipase: 159 U/L (ref 73–393)

## 2014-04-10 LAB — AMMONIA: AMMONIA, PLASMA: 61 umol/L — AB (ref 11–32)

## 2014-04-11 ENCOUNTER — Other Ambulatory Visit: Payer: PRIVATE HEALTH INSURANCE

## 2014-04-11 DIAGNOSIS — B182 Chronic viral hepatitis C: Secondary | ICD-10-CM

## 2014-04-12 LAB — HEPATITIS B SURFACE ANTIGEN: Hepatitis B Surface Ag: NEGATIVE

## 2014-04-12 LAB — HEPATITIS A ANTIBODY, TOTAL: Hep A Total Ab: REACTIVE — AB

## 2014-04-12 LAB — HIV ANTIBODY (ROUTINE TESTING W REFLEX): HIV: NONREACTIVE

## 2014-04-12 LAB — HEPATITIS C RNA QUANTITATIVE
HCV Quantitative Log: 5.37 {Log} — ABNORMAL HIGH (ref <1.18)
HCV Quantitative: 231766 IU/mL — ABNORMAL HIGH (ref <15)

## 2014-04-12 LAB — HEPATITIS B SURFACE ANTIBODY,QUALITATIVE: Hep B S Ab: NEGATIVE

## 2014-04-12 LAB — PROTIME-INR
INR: 1.35 (ref <1.50)
PROTHROMBIN TIME: 16.7 s — AB (ref 11.6–15.2)

## 2014-04-12 LAB — IRON: IRON: 53 ug/dL (ref 42–145)

## 2014-04-12 LAB — ANA: Anti Nuclear Antibody(ANA): NEGATIVE

## 2014-04-12 LAB — HEPATITIS B CORE ANTIBODY, TOTAL: HEP B C TOTAL AB: REACTIVE — AB

## 2014-04-13 LAB — HEPATITIS C GENOTYPE

## 2014-05-05 ENCOUNTER — Emergency Department: Payer: Self-pay | Admitting: Emergency Medicine

## 2014-05-05 LAB — CBC
HCT: 35.1 % (ref 35.0–47.0)
HGB: 11.1 g/dL — ABNORMAL LOW (ref 12.0–16.0)
MCH: 26.1 pg (ref 26.0–34.0)
MCHC: 31.5 g/dL — ABNORMAL LOW (ref 32.0–36.0)
MCV: 83 fL (ref 80–100)
Platelet: 128 10*3/uL — ABNORMAL LOW (ref 150–440)
RBC: 4.24 10*6/uL (ref 3.80–5.20)
RDW: 16.8 % — ABNORMAL HIGH (ref 11.5–14.5)
WBC: 6.2 10*3/uL (ref 3.6–11.0)

## 2014-05-05 LAB — COMPREHENSIVE METABOLIC PANEL
ALK PHOS: 155 U/L — AB
ALT: 32 U/L
Albumin: 3.1 g/dL — ABNORMAL LOW (ref 3.4–5.0)
Anion Gap: 11 (ref 7–16)
BUN: 10 mg/dL (ref 7–18)
Bilirubin,Total: 0.8 mg/dL (ref 0.2–1.0)
CREATININE: 0.87 mg/dL (ref 0.60–1.30)
Calcium, Total: 8.6 mg/dL (ref 8.5–10.1)
Chloride: 109 mmol/L — ABNORMAL HIGH (ref 98–107)
Co2: 24 mmol/L (ref 21–32)
EGFR (African American): >60
EGFR (Non-African Amer.): >60
Glucose: 101 mg/dL — ABNORMAL HIGH (ref 65–99)
Osmolality: 286 (ref 275–301)
Potassium: 3.5 mmol/L (ref 3.5–5.1)
SGOT(AST): 47 U/L — ABNORMAL HIGH (ref 15–37)
Sodium: 144 mmol/L (ref 136–145)
TOTAL PROTEIN: 8.3 g/dL — AB (ref 6.4–8.2)

## 2014-05-05 LAB — LIPASE, BLOOD: Lipase: 103 U/L (ref 73–393)

## 2014-05-06 LAB — URINALYSIS, COMPLETE
BILIRUBIN, UR: NEGATIVE
Blood: NEGATIVE
Glucose,UR: NEGATIVE mg/dL (ref 0–75)
KETONE: NEGATIVE
NITRITE: POSITIVE
Ph: 5 (ref 4.5–8.0)
Protein: NEGATIVE
RBC,UR: 4 /HPF (ref 0–5)
Specific Gravity: 1.018 (ref 1.003–1.030)
Squamous Epithelial: 3

## 2014-05-10 ENCOUNTER — Encounter: Payer: Self-pay | Admitting: Internal Medicine

## 2014-05-10 ENCOUNTER — Ambulatory Visit (INDEPENDENT_AMBULATORY_CARE_PROVIDER_SITE_OTHER): Payer: PRIVATE HEALTH INSURANCE | Admitting: Internal Medicine

## 2014-05-10 VITALS — BP 114/81 | HR 104 | Temp 98.2°F | Ht 68.0 in | Wt 265.0 lb

## 2014-05-10 DIAGNOSIS — Z23 Encounter for immunization: Secondary | ICD-10-CM

## 2014-05-10 DIAGNOSIS — B182 Chronic viral hepatitis C: Secondary | ICD-10-CM

## 2014-05-10 NOTE — Progress Notes (Signed)
+[name] is a 58 y.o. female who presents for initial evaluation and management of a positive Hepatitis C antibody test.  Patient tested positive about 6 years ago, she tells me she was evaluated for transplant at [facility]. Hepatitis C risk factors present are: history of drug use, last use 6 years ago. Patient denies IV drug abuse, tattoos. Patient has had other studies performed. Results: hepatitis C RNA by PCR, result: positive. Patient has not had prior treatment for Hepatitis C. Patient does have a past history of liver disease. Patient does not have a family history of liver disease.   HPI: She has a history of cirrhosis and I note a CT scan that confirms.  Her medical record from [facility] suggests end-stage liver disease. She tells me she was evaluated for transplant 6 years ago. She did not follow-up with him after initial evaluation. She has never been treated for hepatitis C nor was offered treatment. She did ask for pain medications. She is tearful during the exam due to pain. She says she has been nauseous and throwing up for 4 days. She also notes diarrhea for 4 days.  Patient does have documented immunity to Hepatitis A. Patient does have documented immunity to Hepatitis B.     Review of Systems A comprehensive review of systems was negative.   Past Medical History  Diagnosis Date  . Emphysema of lung   . Depression   . Diverticulitis   . Chronic bronchitis   . Malignant brain tumor 2007  . GERD (gastroesophageal reflux disease)   . Allergy   . Hepatitis C   . Hypertension   . Chronic kidney disease   . Migraines   . Urine incontinence   . Seizures   . Stroke 2007    during brain surgery  . Pancreatitis, alcoholic 5643  . Rectovaginal fistula   . H/O ETOH abuse     Sober since 2009  . H/O drug abuse     Clean since 2009  . Pancreatic ascites   . Cirrhosis     Prior to Admission medications   Medication Sig Start Date End Date Taking? Authorizing  Provider  albuterol (PROAIR HFA) 108 (90 BASE) MCG/ACT inhaler Inhale 2 puffs into the lungs every 6 (six) hours as needed for wheezing or shortness of breath.   Yes Historical Provider, MD  albuterol (PROVENTIL) (5 MG/ML) 0.5% nebulizer solution Take 2.5 mg by nebulization 4 (four) times daily.   Yes Historical Provider, MD  ALPRAZolam Duanne Moron) 1 MG tablet Take 1 tablet (1 mg total) by mouth daily. 10/21/12  Yes Raquel Dagoberto Ligas, NP  amitriptyline (ELAVIL) 50 MG tablet Take 1 tablet (50 mg total) by mouth at bedtime. 12/01/12  Yes Jackolyn Confer, MD  amLODipine (NORVASC) 10 MG tablet Take 1 tablet (10 mg total) by mouth daily. 10/21/12  Yes Raquel Dagoberto Ligas, NP  CREON 12000 UNITS CPEP Take 2 capsules by mouth 3 (three) times daily before meals.  08/11/12  Yes Historical Provider, MD  docusate sodium (STOOL SOFTENER) 100 MG capsule Take 100 mg by mouth.   Yes Historical Provider, MD  fesoterodine (TOVIAZ) 8 MG TB24 tablet Take 8 mg by mouth.   Yes Historical Provider, MD  Fluticasone-Salmeterol (ADVAIR) 250-50 MCG/DOSE AEPB Inhale 1 puff into the lungs daily as needed (shortness of breath).    Yes Historical Provider, MD  gabapentin (NEURONTIN) 300 MG capsule Take 300 mg by mouth 2 (two) times daily as needed (pain).  Yes Historical Provider, MD  hydrOXYzine (ATARAX/VISTARIL) 25 MG tablet Take 25 mg by mouth.   Yes Historical Provider, MD  lactulose (CHRONULAC) 10 GM/15ML solution Take 20 g by mouth daily as needed for mild constipation.  09/07/12  Yes Historical Provider, MD  levETIRAcetam (KEPPRA) 500 MG tablet Take 1 tablet (500 mg total) by mouth 2 (two) times daily. 11/03/12  Yes Jackolyn Confer, MD  metoprolol succinate (TOPROL-XL) 25 MG 24 hr tablet Take 1 tablet (25 mg total) by mouth daily. 10/21/12  Yes Raquel Dagoberto Ligas, NP  omeprazole (PRILOSEC) 40 MG capsule Take 40 mg by mouth 2 (two) times daily.   Yes Historical Provider, MD  oxyCODONE (ROXICODONE) 15 MG immediate release tablet Take 15 mg by mouth  every 4 (four) hours as needed for pain. Pain clinic   Yes Historical Provider, MD  phenytoin (DILANTIN) 125 MG/5ML suspension Take 12 mLs (300 mg total) by mouth 2 (two) times daily. 11/03/12  Yes Jackolyn Confer, MD  promethazine (PHENERGAN) 25 MG tablet Take 25 mg by mouth every 6 (six) hours as needed for nausea or vomiting.  11/27/12  Yes Historical Provider, MD  QUEtiapine (SEROQUEL) 100 MG tablet Take 3.5 tablets (350 mg total) by mouth daily. 10/21/12  Yes Raquel Dagoberto Ligas, NP  solifenacin (VESICARE) 10 MG tablet Take 10 mg by mouth daily.   Yes Historical Provider, MD  spironolactone (ALDACTONE) 50 MG tablet Take 1 tablet (50 mg total) by mouth 3 (three) times daily. 10/21/12  Yes Raquel M Rey, NP  temazepam (RESTORIL) 30 MG capsule Take 30 mg by mouth at bedtime.   Yes Historical Provider, MD  tiotropium (SPIRIVA) 18 MCG inhalation capsule Place 18 mcg into inhaler and inhale 2 (two) times daily as needed (shortness of breath).    Yes Historical Provider, MD  triamcinolone (NASACORT ALLERGY 24HR) 55 MCG/ACT AERO nasal inhaler Place 2 sprays into the nose daily as needed (allergies).   Yes Historical Provider, MD  zolpidem (AMBIEN) 10 MG tablet Take 1 tablet (10 mg total) by mouth at bedtime. 10/21/12  Yes Raquel Dagoberto Ligas, NP  ENDOCET 10-325 MG per tablet Take 1 tablet by mouth every 6 (six) hours as needed for pain.  11/27/12   Historical Provider, MD  escitalopram (LEXAPRO) 10 MG tablet Take 1 tablet (10 mg total) by mouth daily. 10/21/12   Raquel Dagoberto Ligas, NP    Allergies  Allergen Reactions  . Dilaudid [Hydromorphone Hcl] Itching  . Hydromorphone Itching  . Sulfa Antibiotics Hives  . Zofran [Ondansetron Hcl] Itching  . Amoxicillin Rash  . Chocolate Rash  . Penicillins Rash  . Strawberry Rash    History  Substance Use Topics  . Smoking status: Current Some Day Smoker -- 0.50 packs/day    Types: Cigarettes  . Smokeless tobacco: Never Used     Comment: cutting back  . Alcohol Use: No      Comment: no alcohol x 79 days    Family History  Problem Relation Age of Onset  . Hypertension Mother   . Heart disease Father   . Diabetes Father   . Cancer Sister     brain  . Cancer Grandchild 8    brain tumor      Objective:   Filed Vitals:   05/10/14 0925  BP: 114/81  Pulse: 104  Temp: 98.2 F (36.8 C)   in no apparent distress and alert HEENT: anicteric Cor RRR and No murmurs clear Bowel sounds are normal, liver  is not enlarged, spleen is not enlarged peripheral pulses normal, no pedal edema, no clubbing or cyanosis negative for - jaundice, spider hemangioma, telangiectasia, palmar erythema, ecchymosis and atrophy  Laboratory Genotype:  Lab Results  Component Value Date   HCVGENOTYPE 1a 04/11/2014   HCV viral load:  Lab Results  Component Value Date   HCVQUANT 109323* 04/11/2014   Lab Results  Component Value Date   WBC 6.0 11/24/2013   HGB 11.3* 11/24/2013   HCT 36.6 11/24/2013   MCV 83.0 11/24/2013   PLT 137* 11/24/2013    Lab Results  Component Value Date   CREATININE 0.97 11/24/2013   BUN 9 11/24/2013   NA 140 11/24/2013   K 4.0 11/24/2013   CL 104 11/24/2013   CO2 25 11/24/2013    Lab Results  Component Value Date   ALT 34 11/24/2013   AST 65* 11/24/2013   ALKPHOS 148* 11/24/2013   BILITOT 1.0 11/24/2013   INR 1.35 04/11/2014      Assessment: Hepatitis C genotype 1a  Plan: 1) Patient counseled extensively on limiting acetaminophen to no more than 2 grams daily, avoidance of alcohol. 2) Transmission discussed with patient including sexual transmission, sharing razors and toothbrush.   3) Will need referral to gastroenterology if concern for cirrhosis 4) Will need referral for substance abuse counseling: No. 5) Will prescribe Harvoni for 12 weeks once work up complete 6) Hepatitis A vaccine No. 7) Hepatitis B vaccine No. 8) Pneumovax vaccine if concern for cirrhosis, she also may need to undergo transplant evaluation by  hepatology if confirmed.   9) will follow up after starting medication or in 1 year if denied

## 2014-05-12 ENCOUNTER — Emergency Department: Payer: Self-pay | Admitting: Emergency Medicine

## 2014-05-12 LAB — BASIC METABOLIC PANEL
Anion Gap: 9 (ref 7–16)
BUN: 8 mg/dL (ref 7–18)
CHLORIDE: 109 mmol/L — AB (ref 98–107)
CREATININE: 0.98 mg/dL (ref 0.60–1.30)
Calcium, Total: 9 mg/dL (ref 8.5–10.1)
Co2: 23 mmol/L (ref 21–32)
EGFR (African American): >60
EGFR (Non-African Amer.): >60
GLUCOSE: 121 mg/dL — AB (ref 65–99)
Osmolality: 281 (ref 275–301)
POTASSIUM: 3.6 mmol/L (ref 3.5–5.1)
Sodium: 141 mmol/L (ref 136–145)

## 2014-05-12 LAB — CBC
HCT: 36.9 % (ref 35.0–47.0)
HGB: 11.5 g/dL — AB (ref 12.0–16.0)
MCH: 25.7 pg — ABNORMAL LOW (ref 26.0–34.0)
MCHC: 31.1 g/dL — ABNORMAL LOW (ref 32.0–36.0)
MCV: 83 fL (ref 80–100)
PLATELETS: 142 10*3/uL — AB (ref 150–440)
RBC: 4.47 10*6/uL (ref 3.80–5.20)
RDW: 17.1 % — ABNORMAL HIGH (ref 11.5–14.5)
WBC: 5.9 10*3/uL (ref 3.6–11.0)

## 2014-05-12 LAB — TROPONIN I

## 2014-05-13 ENCOUNTER — Other Ambulatory Visit: Payer: Medicare Other

## 2014-05-18 ENCOUNTER — Telehealth: Payer: Self-pay | Admitting: *Deleted

## 2014-05-18 NOTE — Telephone Encounter (Signed)
Patient notified of appointment for ultrasound elastography for tomorrow 05/19/14 at 10:15 AM at [facility]. She is aware nothing to eat or drink after midnight. A new order needed to be faxed to [facility] at [phone] stating ultrasound abdomen limited with add on elastography. Patient called back after I spoke to her and stated she could not make it due to a scheduled nurse visit at her home. She was given the # for [facility] scheduling [phone] to call to reschedule. The original appointment was 05/11/14 and she no showed for that.

## 2014-05-27 ENCOUNTER — Other Ambulatory Visit: Payer: Medicare Other

## 2014-06-03 ENCOUNTER — Emergency Department: Payer: Self-pay | Admitting: Emergency Medicine

## 2014-06-03 LAB — CBC WITH DIFFERENTIAL/PLATELET
BASOS ABS: 0.1 10*3/uL (ref 0.0–0.1)
BASOS PCT: 0.6 %
EOS ABS: 0.1 10*3/uL (ref 0.0–0.7)
Eosinophil %: 1.1 %
HCT: 33.8 % — ABNORMAL LOW (ref 35.0–47.0)
HGB: 10.5 g/dL — ABNORMAL LOW (ref 12.0–16.0)
Lymphocyte #: 2.5 10*3/uL (ref 1.0–3.6)
Lymphocyte %: 28.2 %
MCH: 25.6 pg — ABNORMAL LOW (ref 26.0–34.0)
MCHC: 31.1 g/dL — AB (ref 32.0–36.0)
MCV: 83 fL (ref 80–100)
Monocyte #: 0.7 x10 3/mm (ref 0.2–0.9)
Monocyte %: 8.2 %
Neutrophil #: 5.6 10*3/uL (ref 1.4–6.5)
Neutrophil %: 61.9 %
Platelet: 148 10*3/uL — ABNORMAL LOW (ref 150–440)
RBC: 4.1 10*6/uL (ref 3.80–5.20)
RDW: 17 % — AB (ref 11.5–14.5)
WBC: 9 10*3/uL (ref 3.6–11.0)

## 2014-06-03 LAB — COMPREHENSIVE METABOLIC PANEL
ALBUMIN: 3.2 g/dL — AB (ref 3.4–5.0)
ALT: 45 U/L
Alkaline Phosphatase: 174 U/L — ABNORMAL HIGH
Anion Gap: 9 (ref 7–16)
BUN: 14 mg/dL (ref 7–18)
Bilirubin,Total: 0.6 mg/dL (ref 0.2–1.0)
CHLORIDE: 110 mmol/L — AB (ref 98–107)
CREATININE: 1 mg/dL (ref 0.60–1.30)
Calcium, Total: 8.8 mg/dL (ref 8.5–10.1)
Co2: 22 mmol/L (ref 21–32)
EGFR (African American): >60
EGFR (Non-African Amer.): >60
Glucose: 180 mg/dL — ABNORMAL HIGH (ref 65–99)
Osmolality: 286 (ref 275–301)
Potassium: 3.5 mmol/L (ref 3.5–5.1)
SGOT(AST): 43 U/L — ABNORMAL HIGH (ref 15–37)
SODIUM: 141 mmol/L (ref 136–145)
Total Protein: 8.4 g/dL — ABNORMAL HIGH (ref 6.4–8.2)

## 2014-06-03 LAB — URINALYSIS, COMPLETE
BILIRUBIN, UR: NEGATIVE
Blood: NEGATIVE
GLUCOSE, UR: NEGATIVE mg/dL (ref 0–75)
Ketone: NEGATIVE
Nitrite: POSITIVE
PH: 6 (ref 4.5–8.0)
PROTEIN: NEGATIVE
Specific Gravity: 1.009 (ref 1.003–1.030)
Squamous Epithelial: 11
WBC UR: 7 /HPF (ref 0–5)

## 2014-06-03 LAB — LIPASE, BLOOD: LIPASE: 189 U/L (ref 73–393)

## 2014-06-03 LAB — TROPONIN I

## 2014-06-06 LAB — URINE CULTURE

## 2014-06-07 ENCOUNTER — Encounter (HOSPITAL_COMMUNITY): Payer: Self-pay | Admitting: Emergency Medicine

## 2014-06-07 ENCOUNTER — Emergency Department (HOSPITAL_COMMUNITY)
Admission: EM | Admit: 2014-06-07 | Discharge: 2014-06-08 | Discharge status: Against Medical Advice | Payer: PRIVATE HEALTH INSURANCE | Attending: Emergency Medicine | Admitting: Emergency Medicine

## 2014-06-07 DIAGNOSIS — R109 Unspecified abdominal pain: Secondary | ICD-10-CM | POA: Diagnosis present

## 2014-06-07 DIAGNOSIS — N189 Chronic kidney disease, unspecified: Secondary | ICD-10-CM | POA: Diagnosis not present

## 2014-06-07 DIAGNOSIS — J439 Emphysema, unspecified: Secondary | ICD-10-CM | POA: Diagnosis not present

## 2014-06-07 DIAGNOSIS — Z720 Tobacco use: Secondary | ICD-10-CM | POA: Diagnosis not present

## 2014-06-07 DIAGNOSIS — R1012 Left upper quadrant pain: Secondary | ICD-10-CM | POA: Insufficient documentation

## 2014-06-07 DIAGNOSIS — R112 Nausea with vomiting, unspecified: Secondary | ICD-10-CM | POA: Insufficient documentation

## 2014-06-07 DIAGNOSIS — I129 Hypertensive chronic kidney disease with stage 1 through stage 4 chronic kidney disease, or unspecified chronic kidney disease: Secondary | ICD-10-CM | POA: Insufficient documentation

## 2014-06-07 LAB — PREGNANCY, URINE: PREG TEST UR: NEGATIVE

## 2014-06-07 LAB — CBC WITH DIFFERENTIAL/PLATELET
BASOS ABS: 0 10*3/uL (ref 0.0–0.1)
Basophils Relative: 0 % (ref 0–1)
EOS ABS: 0.1 10*3/uL (ref 0.0–0.7)
EOS PCT: 1 % (ref 0–5)
HCT: 36.1 % (ref 36.0–46.0)
Hemoglobin: 11.3 g/dL — ABNORMAL LOW (ref 12.0–15.0)
LYMPHS PCT: 34 % (ref 12–46)
Lymphs Abs: 3.4 10*3/uL (ref 0.7–4.0)
MCH: 25.5 pg — AB (ref 26.0–34.0)
MCHC: 31.3 g/dL (ref 30.0–36.0)
MCV: 81.5 fL (ref 78.0–100.0)
Monocytes Absolute: 0.7 10*3/uL (ref 0.1–1.0)
Monocytes Relative: 7 % (ref 3–12)
Neutro Abs: 5.9 10*3/uL (ref 1.7–7.7)
Neutrophils Relative %: 58 % (ref 43–77)
PLATELETS: 170 10*3/uL (ref 150–400)
RBC: 4.43 MIL/uL (ref 3.87–5.11)
RDW: 16 % — AB (ref 11.5–15.5)
WBC: 10.1 10*3/uL (ref 4.0–10.5)

## 2014-06-07 LAB — URINALYSIS, ROUTINE W REFLEX MICROSCOPIC
Bilirubin Urine: NEGATIVE
Glucose, UA: NEGATIVE mg/dL
Hgb urine dipstick: NEGATIVE
Ketones, ur: NEGATIVE mg/dL
LEUKOCYTES UA: NEGATIVE
NITRITE: NEGATIVE
Protein, ur: NEGATIVE mg/dL
Specific Gravity, Urine: 1.011 (ref 1.005–1.030)
UROBILINOGEN UA: 2 mg/dL — AB (ref 0.0–1.0)
pH: 6.5 (ref 5.0–8.0)

## 2014-06-07 LAB — COMPREHENSIVE METABOLIC PANEL
ALBUMIN: 3.5 g/dL (ref 3.5–5.2)
ALT: 37 U/L — AB (ref 0–35)
AST: 43 U/L — AB (ref 0–37)
Alkaline Phosphatase: 197 U/L — ABNORMAL HIGH (ref 39–117)
Anion gap: 16 — ABNORMAL HIGH (ref 5–15)
BUN: 12 mg/dL (ref 6–23)
CALCIUM: 9.3 mg/dL (ref 8.4–10.5)
CO2: 21 mEq/L (ref 19–32)
CREATININE: 0.8 mg/dL (ref 0.50–1.10)
Chloride: 104 mEq/L (ref 96–112)
GFR calc Af Amer: >90 mL/min (ref >90)
GFR calc non Af Amer: 80 mL/min — ABNORMAL LOW (ref >90)
Glucose, Bld: 80 mg/dL (ref 70–99)
Potassium: 3.8 mEq/L (ref 3.7–5.3)
Sodium: 141 mEq/L (ref 137–147)
Total Bilirubin: 0.7 mg/dL (ref 0.3–1.2)
Total Protein: 8.9 g/dL — ABNORMAL HIGH (ref 6.0–8.3)

## 2014-06-07 LAB — LIPASE, BLOOD: LIPASE: 37 U/L (ref 11–59)

## 2014-06-07 NOTE — ED Notes (Signed)
Pt. reports LUQ pain with nausea and emesis onset today , denies diarrhea , no fever or chills.

## 2014-06-10 ENCOUNTER — Telehealth: Payer: Self-pay | Admitting: *Deleted

## 2014-06-10 ENCOUNTER — Ambulatory Visit: Payer: Self-pay | Admitting: Nurse Practitioner

## 2014-06-10 NOTE — Telephone Encounter (Signed)
Patient left message asking about referral - has not heard anything

## 2014-06-12 ENCOUNTER — Emergency Department: Payer: Self-pay | Admitting: Emergency Medicine

## 2014-06-14 ENCOUNTER — Ambulatory Visit: Payer: PRIVATE HEALTH INSURANCE | Admitting: Internal Medicine

## 2014-06-16 ENCOUNTER — Ambulatory Visit: Payer: PRIVATE HEALTH INSURANCE | Admitting: Internal Medicine

## 2014-06-16 ENCOUNTER — Telehealth: Payer: Self-pay | Admitting: *Deleted

## 2014-06-16 IMAGING — CR DG CHEST 1V PORT
1 series · 1 of 1 positions shown · non-contrast
Comparison: none

REASON FOR EXAM: short of breath - copd
COMMENTS:

[ap]
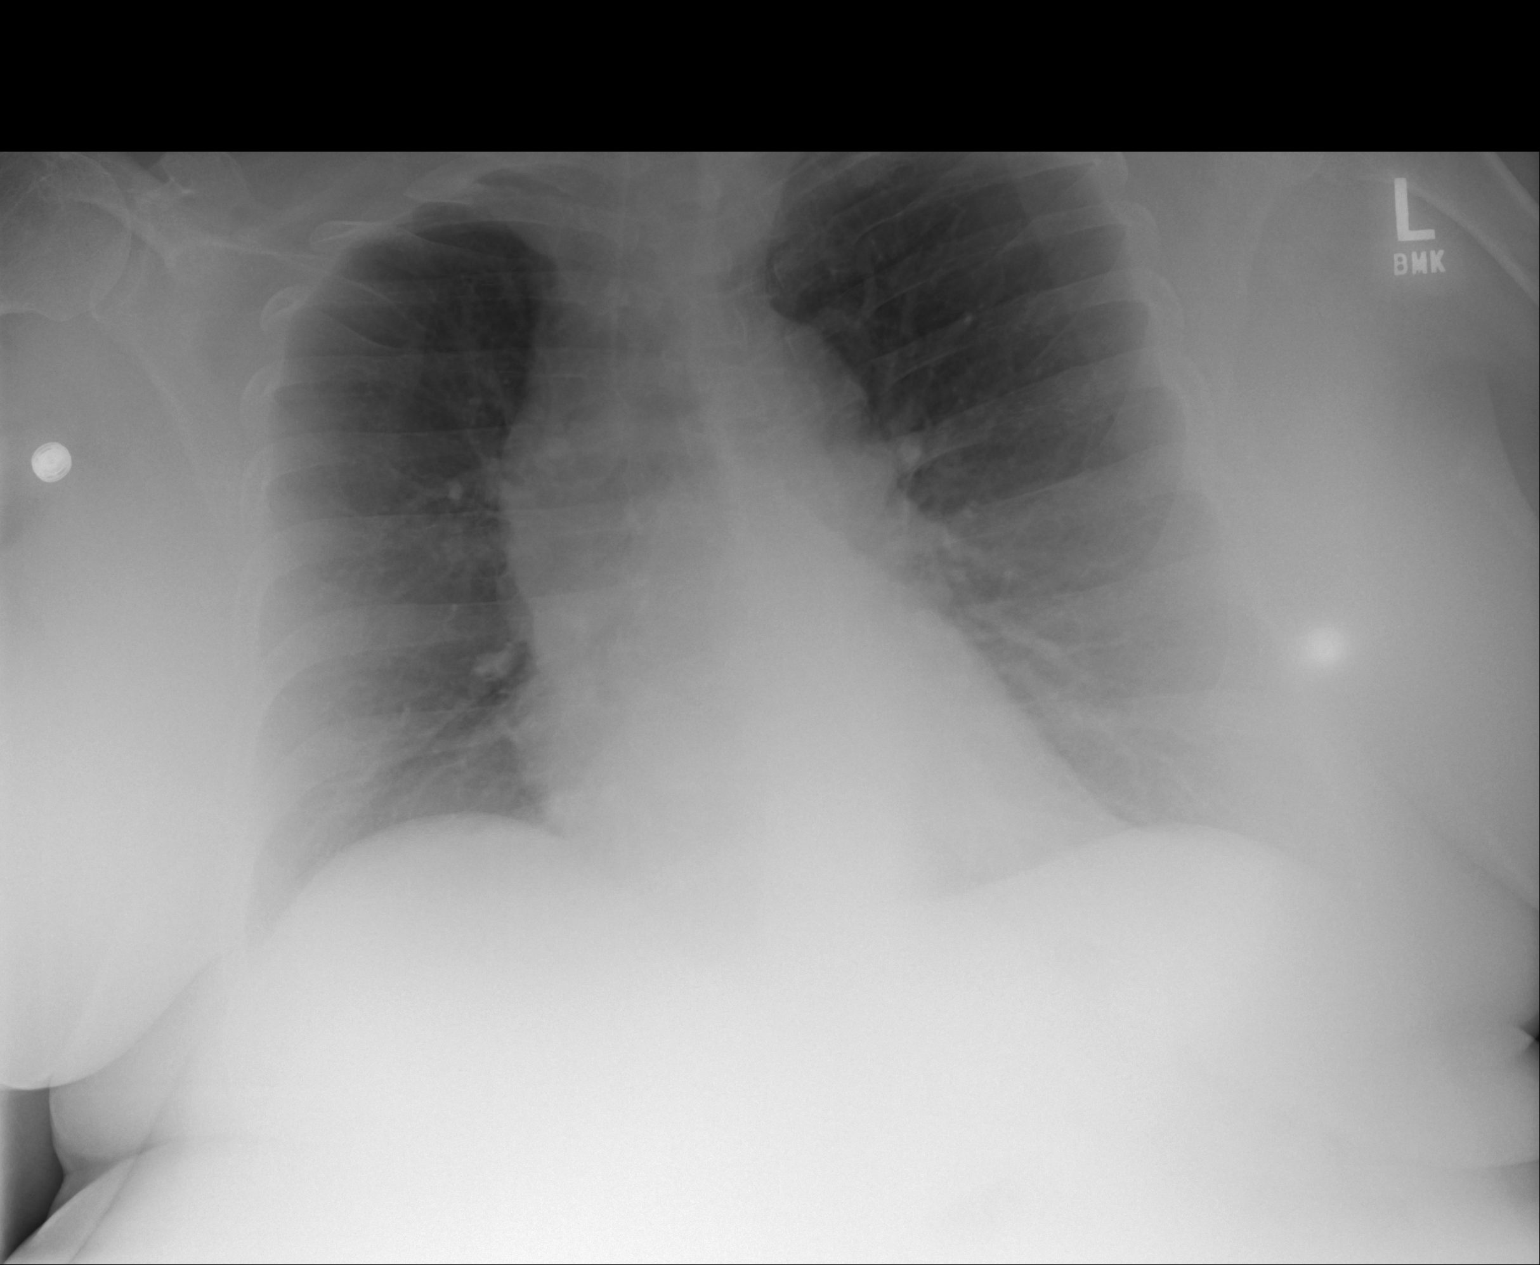

[1 of 1 positions shown; findings below may reference images not displayed]

PROCEDURE:     DXR - DXR PORTABLE CHEST SINGLE VIEW  - July 06, 2012  [DATE]

RESULT:     Comparison made to prior study of 02/19/2012. Widening of the
mediastinum most likely secondary to prominence of the ascending aorta .
Similar findings noted on prior study of 02/19/2012. Heart size is normal.
Lungs are clear.
IMPRESSION: Widening of the mediastinum, most likely secondary to
prominence of the ascending aorta. Chest CT may prove useful for further
evaluation to exclude an ascending aortic aneurysm. Similar finding on prior
study February 19, 2012.

[REDACTED].

## 2014-06-16 NOTE — Telephone Encounter (Signed)
Pt requesting assistance in rescheduling ABD [place] w/ Elastography.  Pt had called [facility] to reschedule this exam and was told that [facility] would need to reschedule this for her.  Phone call to [facility], [phone].  Left message to call [facility] to reschedule test.

## 2014-06-20 ENCOUNTER — Emergency Department: Payer: Self-pay | Admitting: Emergency Medicine

## 2014-06-20 LAB — COMPREHENSIVE METABOLIC PANEL
ALK PHOS: 182 U/L — AB
ALT: 35 U/L
AST: 33 U/L (ref 15–37)
Albumin: 2.8 g/dL — ABNORMAL LOW (ref 3.4–5.0)
Anion Gap: 10 (ref 7–16)
BUN: 6 mg/dL — ABNORMAL LOW (ref 7–18)
Bilirubin,Total: 0.6 mg/dL (ref 0.2–1.0)
CREATININE: 0.99 mg/dL (ref 0.60–1.30)
Calcium, Total: 8.7 mg/dL (ref 8.5–10.1)
Chloride: 110 mmol/L — ABNORMAL HIGH (ref 98–107)
Co2: 24 mmol/L (ref 21–32)
GLUCOSE: 115 mg/dL — AB (ref 65–99)
Osmolality: 285 (ref 275–301)
POTASSIUM: 3.2 mmol/L — AB (ref 3.5–5.1)
Sodium: 144 mmol/L (ref 136–145)
Total Protein: 7.8 g/dL (ref 6.4–8.2)

## 2014-06-20 LAB — CBC
HCT: 33.1 % — ABNORMAL LOW (ref 35.0–47.0)
HGB: 10.1 g/dL — ABNORMAL LOW (ref 12.0–16.0)
MCH: 25.2 pg — ABNORMAL LOW (ref 26.0–34.0)
MCHC: 30.6 g/dL — ABNORMAL LOW (ref 32.0–36.0)
MCV: 82 fL (ref 80–100)
Platelet: 139 10*3/uL — ABNORMAL LOW (ref 150–440)
RBC: 4.02 10*6/uL (ref 3.80–5.20)
RDW: 16.8 % — ABNORMAL HIGH (ref 11.5–14.5)
WBC: 6 10*3/uL (ref 3.6–11.0)

## 2014-06-20 LAB — LIPASE, BLOOD: LIPASE: 147 U/L (ref 73–393)

## 2014-06-21 LAB — URINALYSIS, COMPLETE
BLOOD: NEGATIVE
Bilirubin,UR: NEGATIVE
Glucose,UR: NEGATIVE mg/dL (ref 0–75)
KETONE: NEGATIVE
Leukocyte Esterase: NEGATIVE
NITRITE: NEGATIVE
PROTEIN: NEGATIVE
Ph: 5 (ref 4.5–8.0)
RBC,UR: <1 /HPF (ref 0–5)
Specific Gravity: 1.018 (ref 1.003–1.030)
Squamous Epithelial: 10
WBC UR: 3 /HPF (ref 0–5)

## 2014-06-22 IMAGING — CR DG CHEST 1V PORT
1 series · 1 of 1 positions shown · non-contrast
Comparison: none

REASON FOR EXAM: picc line placement
COMMENTS:

PROCEDURE:     DXR - DXR PORTABLE CHEST SINGLE VIEW  - July 12, 2012  [DATE]
RESULT:     This study was compared to a previous study same date earlier
time

[ap]
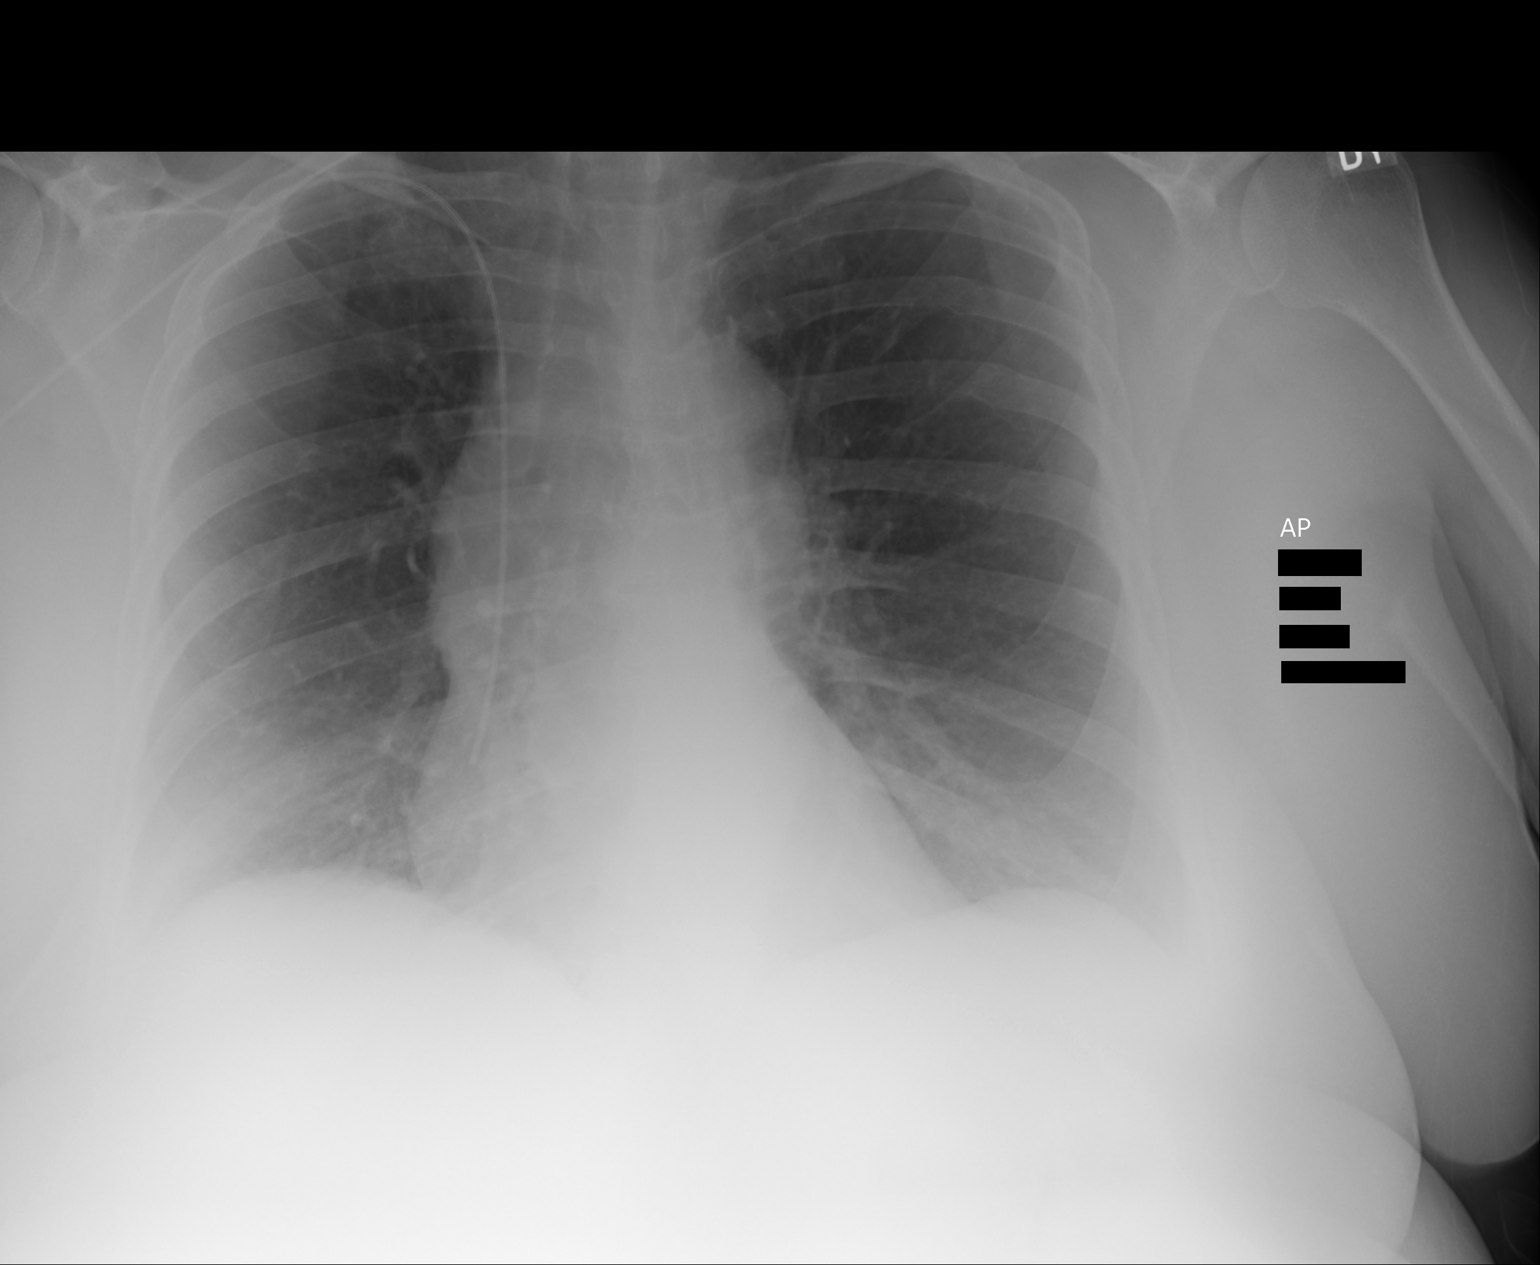

[1 of 1 positions shown; findings below may reference images not displayed]

FINDINGS: There is no evidence of focal infiltrates, effusions or edema. A
right-sided central venous catheter is appreciated with tip projecting in
the region of the superior vena cava right atrial junction. The cardiac
silhouette demonstrates a tortuous ectatic aorta and is otherwise
unremarkable. The visualized bony skeleton is unremarkable.
IMPRESSION: 1. Right-sided central venous catheter as described above.
2. Otherwise, there is no significant change in the chest radiograph.

## 2014-06-22 IMAGING — CR DG CHEST 1V PORT
1 series · 1 of 1 positions shown · non-contrast
Comparison: none

REASON FOR EXAM: cough
COMMENTS:

PROCEDURE:     DXR - DXR PORTABLE CHEST SINGLE VIEW  - July 12, 2012  [DATE]
RESULT:     The lungs are clear. The cardiac silhouette and visualized bony
skeleton are unremarkable.

[ap]
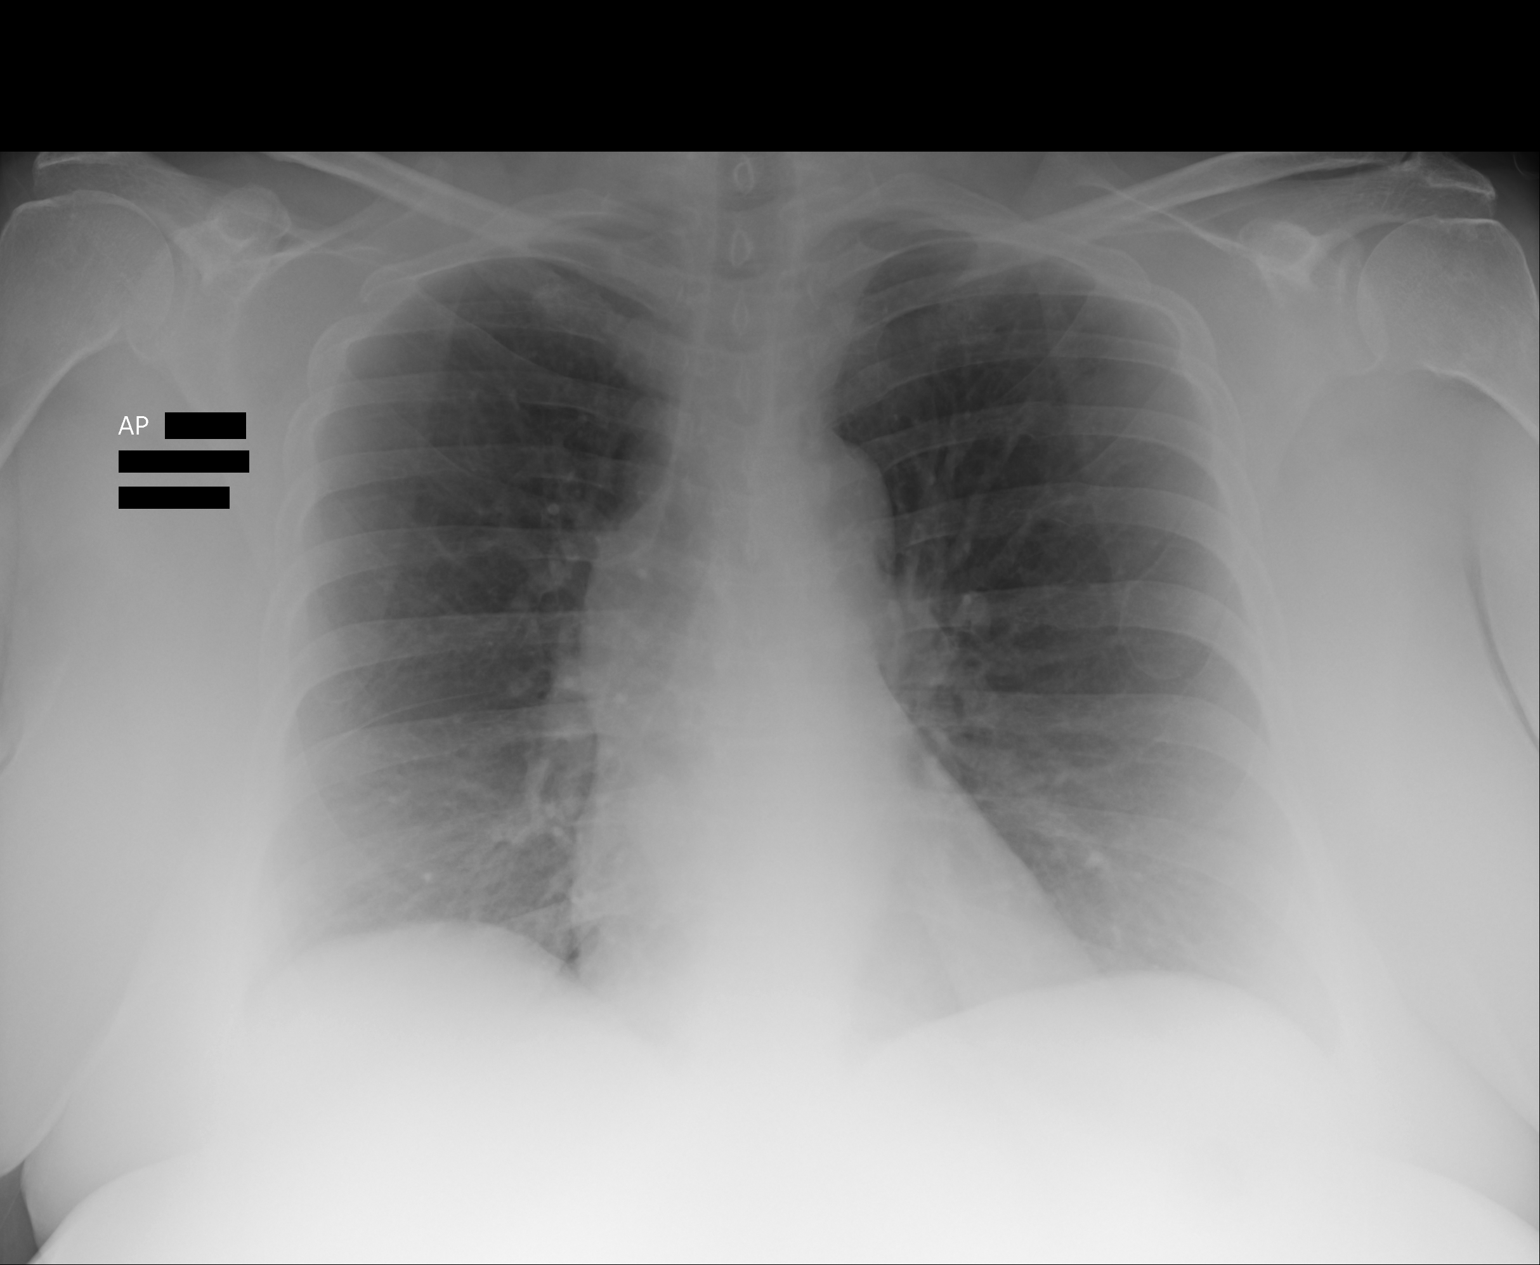

[1 of 1 positions shown; findings below may reference images not displayed]

IMPRESSION: 1. Chest radiograph without evidence of acute cardiopulmonary disease.
2. Comparison made to prior study dated 07/06/2012.

## 2014-07-03 ENCOUNTER — Emergency Department: Payer: Self-pay | Admitting: Emergency Medicine

## 2014-07-03 LAB — URINALYSIS, COMPLETE
Bilirubin,UR: NEGATIVE
Blood: NEGATIVE
Glucose,UR: NEGATIVE mg/dL (ref 0–75)
Ketone: NEGATIVE
Leukocyte Esterase: NEGATIVE
Nitrite: POSITIVE
Ph: 6 (ref 4.5–8.0)
Protein: NEGATIVE
RBC,UR: <1 /HPF (ref 0–5)
Specific Gravity: 1.003 (ref 1.003–1.030)
Squamous Epithelial: 1
WBC UR: <1 /HPF (ref 0–5)

## 2014-07-05 ENCOUNTER — Ambulatory Visit: Payer: Self-pay | Admitting: Internal Medicine

## 2014-07-08 DIAGNOSIS — Z8619 Personal history of other infectious and parasitic diseases: Secondary | ICD-10-CM

## 2014-07-08 HISTORY — DX: Personal history of other infectious and parasitic diseases: Z86.19

## 2014-07-13 ENCOUNTER — Telehealth: Payer: Self-pay | Admitting: *Deleted

## 2014-07-13 NOTE — Telephone Encounter (Signed)
Patient called requesting pain medication for "abdomen and liver pain". States she was seen at [facility] and they would not give her anything for pain. She had the elastograpy done at [facility] on 07/05/14 and results requested and received. She stated that whenever she is in pain she goes to the ED and she feels she is treated badly because she said they have accused her of "just wanting pain medication". Advised her that Dr. Linus Salmons typically does not prescribe narcotics and to keep her appointment on 07/19/14 to go over ultrasound results. Myrtis Hopping CMA

## 2014-07-13 NOTE — Telephone Encounter (Signed)
Referred pt called, states that she received a referral from her doctor 3 months ago to be seen in this clinic and has not heard back.

## 2014-07-14 DIAGNOSIS — F418 Other specified anxiety disorders: Secondary | ICD-10-CM | POA: Diagnosis not present

## 2014-07-14 DIAGNOSIS — N823 Fistula of vagina to large intestine: Secondary | ICD-10-CM | POA: Diagnosis not present

## 2014-07-14 DIAGNOSIS — R399 Unspecified symptoms and signs involving the genitourinary system: Secondary | ICD-10-CM | POA: Diagnosis not present

## 2014-07-14 DIAGNOSIS — G8929 Other chronic pain: Secondary | ICD-10-CM | POA: Diagnosis not present

## 2014-07-14 NOTE — Telephone Encounter (Signed)
Pt.'s PCP was notified November 16 that the patient was not accepted. Hopefully, they have referred her to another clinic.

## 2014-07-18 DIAGNOSIS — M810 Age-related osteoporosis without current pathological fracture: Secondary | ICD-10-CM | POA: Diagnosis not present

## 2014-07-18 DIAGNOSIS — M75102 Unspecified rotator cuff tear or rupture of left shoulder, not specified as traumatic: Secondary | ICD-10-CM | POA: Diagnosis not present

## 2014-07-19 ENCOUNTER — Telehealth: Payer: Self-pay | Admitting: *Deleted

## 2014-07-19 ENCOUNTER — Encounter: Payer: Self-pay | Admitting: Internal Medicine

## 2014-07-19 ENCOUNTER — Ambulatory Visit (INDEPENDENT_AMBULATORY_CARE_PROVIDER_SITE_OTHER): Payer: Medicare Other | Admitting: Internal Medicine

## 2014-07-19 VITALS — BP 124/81 | HR 109 | Temp 98.0°F | Wt 270.0 lb

## 2014-07-19 DIAGNOSIS — K7460 Unspecified cirrhosis of liver: Secondary | ICD-10-CM

## 2014-07-19 DIAGNOSIS — B182 Chronic viral hepatitis C: Secondary | ICD-10-CM | POA: Diagnosis not present

## 2014-07-19 MED ORDER — LEDIPASVIR-SOFOSBUVIR 90-400 MG PO TABS: 1.0000 | ORAL_TABLET | Freq: Every day | ORAL | Status: DC

## 2014-07-19 NOTE — Assessment & Plan Note (Addendum)
I will refer her to Dr. Allen Norris in [facility] she has seen in the past for management of her cirrhosis. She tells me she has not had an EGD in several years though the office seem to indicate that she has had one recently.  After review, Dr. Allen Norris has already seen her and referred her to [facility] and also called [facility] in [place] and she has been dismissed from the practice.  Therefore, will need to be seen in [facility].

## 2014-07-19 NOTE — Assessment & Plan Note (Addendum)
Will send for Harvoni.  Will need to limit ppi to 20 mg daily.  Will need to hold phenytoin.  Will wait for approval and need to discuss the interactions before starting.   May need to consider daclatasvir and sofosbuvir instead due to the interactions.

## 2014-07-19 NOTE — Progress Notes (Signed)
   Subjective:    Patient ID: [name], female    DOB: 09-Jul-1955, 59 y.o.   MRN: [id number]  HPI She is here from of her hepatitis C. She has genotype 1A with an initial viral load of 231,000. She is hepatitis A and B core reactive. She has had on elastography which shows fibrosis of F3 to 4. The liver also was reported to have a nodular hepatic contour compatible with cirrhosis.     Reports she continues to have problems with pain.  Is being referred to a pain clinic.     Review of Systems  Constitutional: Negative for fatigue.  Gastrointestinal: Negative for nausea and diarrhea.  Skin: Negative for rash.  Neurological: Negative for dizziness and light-headedness.       Objective:   Physical Exam  Constitutional: She appears well-developed and well-nourished.  Morbidly obese  Cardiovascular: Normal rate, regular rhythm and normal heart sounds.   No murmur heard. Pulmonary/Chest: Effort normal and breath sounds normal. No respiratory distress.  Skin: No rash noted.          Assessment & Plan:

## 2014-07-19 NOTE — Telephone Encounter (Signed)
Attempted to refer patient to [facility], Dr. Lynnell Jude' office and they have already referred patient to [facility]. Checked with [facility] and they did not receive the referral. Notified Dr. Allen Norris [phone] to please refax records to [phone] and Abigail Butts will resend. Patient wanted to be seen by GI at [facility], Dr. Vira Agar, and their office declined to see patient. Patient notified that she is being referred to [facility] by Dr. Durwin Reges and will be notified when the appointment is scheduled. Referral is for cirrhosis. [name] CMA

## 2014-07-19 NOTE — Patient Instructions (Signed)
Date 07/19/14  Dear Ms Ronnald Ramp, As discussed in the [facility], your hepatitis C therapy will include the following medications:          Harvoni 90mg /400mg  tablet:           Take 1 tablet by mouth once daily   Please note that ALL MEDICATIONS WILL START ON THE SAME DATE for a total of 12 weeks. ---------------------------------------------------------------- Your HCV Treatment Start Date: TBA   Your HCV genotype:  1a    Liver Fibrosis: F3-4    ---------------------------------------------------------------- YOUR PHARMACY CONTACT:   [place] [facility] Phone: [phone] Hours: Monday to Friday 7:30 am to 6:00 pm   Please always contact your pharmacy at least 3-4 business days before you run out of medications to ensure your next month's medication is ready or 1 week prior to running out if you receive it by mail.  Remember, each prescription is for 28 days. ---------------------------------------------------------------- GENERAL NOTES REGARDING YOUR HEPATITIS C MEDICATION:  SOFOSBUVIR/LEDIPASVIR (HARVONI): - Harvoni tablet is taken daily with OR without food. - The tablets are orange. - The tablets should be stored at room temperature.  - Acid reducing agents such as H2 blockers (ie. Pepcid (famotidine), Zantac (ranitidine), Tagamet (cimetidine), Axid (nizatidine) and proton pump inhibitors (ie. Prilosec (omeprazole), Protonix (pantoprazole), Nexium (esomeprazole), or Aciphex (rabeprazole)) can decrease effectiveness of Harvoni. Do not take until you have discussed with a health care provider.    -Antacids that contain magnesium and/or aluminum hydroxide (ie. Milk of Magensia, Rolaids, Gaviscon, Maalox, Mylanta, an dArthritis Pain Formula)can reduce absorption of Harvoni, so take them at least 4 hours before or after Harvoni.  -Calcium carbonate (calcium supplements or antacids such as Tums, Caltrate,  Os-Cal)needs to be taken at least 4 hours hours before or after Harvoni.  -St. John's wort or any products that contain St. John's wort like some herbal supplements  Please inform the office prior to starting any of these medications.  - The common side effects with Harvoni:      1. Fatigue      2. Headache      3. Nausea      4. Diarrhea      5. Insomnia   Support Path is a suite of resources designed to help patients start with HARVONI and move toward treatment completion [place] helps patients access therapy and get off to an efficient start  Benefits investigation and prior authorization support Co-pay and other financial assistance A specialty pharmacy finder CO-PAY COUPON The [place] co-pay coupon may help eligible patients lower their out-of-pocket costs. With a co-pay coupon, most eligible patients may pay no more than $5 per co-pay (restrictions apply) www.harvoni.com call (737)426-2812 Not valid for patients enrolled in government healthcare prescription drug programs, such as Medicare Part D and Medicaid. Patients in the coverage gap known as the "donut hole" also are not eligible The HARVONI co-pay coupon program will cover the out-of-pocket costs for HARVONI prescriptions up to a maximum of 25% of the catalog price of a 12-week regimen of HARVONI  Please note that this only lists the most common side effects and is NOT a comprehensive list of the potential side effects of these medications. For more information, please review the drug information sheets that come with your medication package from the pharmacy.  ---------------------------------------------------------------- GENERAL HELPFUL HINTS ON HCV THERAPY: 1. No alcohol. 2. Protect against sun-sensitivity/sunburns (wear sunglasses, hat, long sleeves, pants  and sunscreen). 3. Stay well-hydrated/well-moisturized. 4. Notify the [facility] of any changes in your other over-the-counter/herbal or  prescription medications. 5. If you miss a dose of your medication, take the missed dose as soon as you remember. Return to your regular time/dose schedule the next day.  6.  Do not stop taking your medications without first talking with your healthcare provider. 7.  You may take Tylenol (acetaminophen), as long as the dose is less than 2000 mg (OR no more than 4 tablets of the Tylenol Extra Strengths 500mg  tablet) in 24 hours. 8.  You will need to obtain routine labs and/or office visits at [facility] at weeks 2, 4, 8,  and 12 as well as 12 and 24 weeks after completion of treatment.   Scharlene Gloss, [place] for [address] [place] [place], [place]  [phone]

## 2014-07-20 ENCOUNTER — Telehealth: Payer: Self-pay | Admitting: *Deleted

## 2014-07-20 NOTE — Telephone Encounter (Signed)
Made 2 attempts to contact pt, no answer, mail box disabled

## 2014-07-21 NOTE — Telephone Encounter (Signed)
Patient called asking for assistance with PA request for Harvoni.  She states she only needs Dr. Henreitta Leber signature to complete the PA.  She will bring it to clinic.  RN advised patient to ask for Onnie Boer, PharmD. Landis Gandy, RN

## 2014-07-26 ENCOUNTER — Encounter: Payer: Self-pay | Admitting: Internal Medicine

## 2014-07-26 ENCOUNTER — Ambulatory Visit (INDEPENDENT_AMBULATORY_CARE_PROVIDER_SITE_OTHER): Payer: Medicare Other | Admitting: Internal Medicine

## 2014-07-26 ENCOUNTER — Other Ambulatory Visit: Payer: Self-pay | Admitting: Pharmacist Clinician (PhC)/ Clinical Pharmacy Specialist

## 2014-07-26 VITALS — BP 113/77 | HR 111 | Temp 98.0°F | Wt 272.0 lb

## 2014-07-26 DIAGNOSIS — F17210 Nicotine dependence, cigarettes, uncomplicated: Secondary | ICD-10-CM | POA: Diagnosis not present

## 2014-07-26 DIAGNOSIS — J449 Chronic obstructive pulmonary disease, unspecified: Secondary | ICD-10-CM | POA: Diagnosis not present

## 2014-07-26 DIAGNOSIS — K859 Acute pancreatitis, unspecified: Secondary | ICD-10-CM | POA: Diagnosis not present

## 2014-07-26 DIAGNOSIS — B182 Chronic viral hepatitis C: Secondary | ICD-10-CM | POA: Diagnosis not present

## 2014-07-26 DIAGNOSIS — E119 Type 2 diabetes mellitus without complications: Secondary | ICD-10-CM | POA: Diagnosis not present

## 2014-07-26 DIAGNOSIS — K219 Gastro-esophageal reflux disease without esophagitis: Secondary | ICD-10-CM | POA: Diagnosis not present

## 2014-07-26 MED ORDER — OMEPRAZOLE 20 MG PO CPDR: 20.0000 mg | DELAYED_RELEASE_CAPSULE | Freq: Every day | ORAL | Status: DC

## 2014-07-26 NOTE — Progress Notes (Signed)
   Subjective:    Patient ID: [name], female    DOB: 1956/05/12, 59 y.o.   MRN: [id number]  HPI She comes in for follow-up of hepatitis C. She has genotype 1A with an initial viral load 100,000 and elastography of F4. She has previously seen gastroenterology in [place] however has been dismissed from the practices. She has been referred to [facility] gastroenterology but has not yet gone. She was recently approved for Harvoni is going to start today.   She has Dilantin listed on her medication list however she states she has not taken it in over 3 months. She does take Keppra daily. She takes Nexium as well but is okay stopping the Nexium.   Review of Systems  Constitutional: Negative for fatigue.  Gastrointestinal: Negative for abdominal pain.  Skin: Negative for rash.  Neurological: Negative for dizziness and light-headedness.       Objective:   Physical Exam  Constitutional: She appears well-developed and well-nourished. No distress.  HENT:  Mouth/Throat: No oropharyngeal exudate.  Eyes: No scleral icterus.  Cardiovascular: Normal rate, regular rhythm and normal heart sounds.   No murmur heard. Pulmonary/Chest: Breath sounds normal. No respiratory distress.  Lymphadenopathy:    She has no cervical adenopathy.  Skin: No rash noted.          Assessment & Plan:

## 2014-07-26 NOTE — Progress Notes (Signed)
Patient ID: [name], female   DOB: 02/03/1956, 59 y.o.   MRN: [id number] HPI: [name] is a 59 y.o. female who is here for her Hep C follow up.  Lab Results  Component Value Date   HCVGENOTYPE 1a 04/11/2014    Allergies: Allergies  Allergen Reactions  . Dilaudid [Hydromorphone Hcl] Itching  . Hydromorphone Itching  . Sulfa Antibiotics Hives  . Zofran [Ondansetron Hcl] Itching  . Amoxicillin Rash  . Chocolate Rash  . Penicillins Rash  . Strawberry Rash    Vitals: Temp: 98 F (36.7 C) (01/19 1127) Temp Source: Oral (01/19 1127) BP: 113/77 mmHg (01/19 1127) Pulse Rate: 111 (01/19 1127)  Past Medical History: Past Medical History  Diagnosis Date  . Emphysema of lung   . Depression   . Diverticulitis   . Chronic bronchitis   . Malignant brain tumor 2007  . GERD (gastroesophageal reflux disease)   . Allergy   . Hepatitis C   . Hypertension   . Chronic kidney disease   . Migraines   . Urine incontinence   . Seizures   . Stroke 2007    during brain surgery  . Pancreatitis, alcoholic 4854  . Rectovaginal fistula   . H/O ETOH abuse     Sober since 2009  . H/O drug abuse     Clean since 2009  . Pancreatic ascites   . Cirrhosis     Social History: History   Social History  . Marital Status: Married    Spouse Name: N/A    Number of Children: N/A  . Years of Education: N/A   Social History Main Topics  . Smoking status: Current Some Day Smoker -- 0.25 packs/day    Types: Cigarettes  . Smokeless tobacco: Never Used     Comment: cutting back  . Alcohol Use: No     Comment: no alcohol since 2013  . Drug Use: No  . Sexual Activity: Yes    Birth Control/ Protection: Other-see comments     Comment: hysterectomy   Other Topics Concern  . None   Social History Narrative   Lives in [place] with husband, has 4 sons (2 in prison).             Labs: HEP B S AB (no units)  Date Value  04/11/2014 NEG   HEPATITIS B SURFACE AG (no  units)  Date Value  04/11/2014 NEGATIVE    Lab Results  Component Value Date   HCVGENOTYPE 1a 04/11/2014    Hepatitis C RNA quantitative Latest Ref Rng 04/11/2014  HCV Quantitative <15 IU/mL 231766(H)  HCV Quantitative Log <1.18 log 10 5.37(H)    AST (U/L)  Date Value  06/07/2014 43*  11/24/2013 65*  11/03/2012 54*   ALT (U/L)  Date Value  06/07/2014 37*  11/24/2013 34  11/03/2012 37*   INR (no units)  Date Value  04/11/2014 1.35    CrCl: CrCl cannot be calculated (Patient has no serum creatinine result on file.).  Fibrosis Score: F3/F4 as assessed by Elastography  Child-Pugh Score: Class A  Previous Treatment Regimen: Naive  Assessment: She has been kicked out of several clinics in the past. She has been approved for Harvoni now. She is on 20 something meds that she brought here. There are 2 issues. First, she is supposedly on Dilantin but she only took it intermitently and has not take it in the past 3 months. Second, she is also on Nexium/Prilosec BID. We stressed to  her and the CNA that she needs to stop the Dilantin while she is on Harvoni. We are going to decrease the Prilosec down to 20mg  max. A calendar was made for her about harvoni, prilosec, and stopping dilantin. Gave it to the CNA.   Recommendations:  Start Harvoni 1 PO qday 07/27/17 See back in 3 wks for labs  [place], [place], [place].D., BCPS, AAHIVP [facility] [place] [facility] 07/26/2014, 2:28 PM

## 2014-07-26 NOTE — Assessment & Plan Note (Addendum)
She will start [place] tomorrow. She will follow up in February and I will do labs in the visit at the same time and she can get her refill at that time since she is coming from a distance. Reviewed the indications as well as the pharmacist of taking it daily with no missed doses.  I did reiterate with her that she should definitely be seen by gastroenterology and she would need to go to [facility] where she has been referred to by her previous gastroenterologist. I encouraged her to keep that appointment.  She did ask if I was "her doctor". I did emphasize again that I am her infectious disease provider taking care of the hepatitis C and she still needs to see her gastroenterologist and her primary physicians for all other issues.  Interactions reviewed, side effects discussed and treatment plan discussed. She was given a calendar for her medication.

## 2014-07-27 ENCOUNTER — Ambulatory Visit: Payer: Self-pay | Admitting: Obstetrics and Gynecology

## 2014-07-27 DIAGNOSIS — N823 Fistula of vagina to large intestine: Secondary | ICD-10-CM | POA: Diagnosis not present

## 2014-07-28 DIAGNOSIS — N823 Fistula of vagina to large intestine: Secondary | ICD-10-CM | POA: Diagnosis not present

## 2014-07-28 DIAGNOSIS — N63 Unspecified lump in breast: Secondary | ICD-10-CM | POA: Diagnosis not present

## 2014-07-28 DIAGNOSIS — G8929 Other chronic pain: Secondary | ICD-10-CM | POA: Diagnosis not present

## 2014-07-29 IMAGING — CR DG CHEST 1V PORT
1 series · 1 of 1 positions shown · non-contrast
Comparison: none

REASON FOR EXAM: shortness of breath, productive cough
COMMENTS:

PROCEDURE:     DXR - DXR PORTABLE CHEST SINGLE VIEW  - August 18, 2012 [DATE]
RESULT:     Comparison made to prior study of 07/12/2012. Lungs clear. Heart
size normal. Previously identified PICC line has been removed.

[ap]
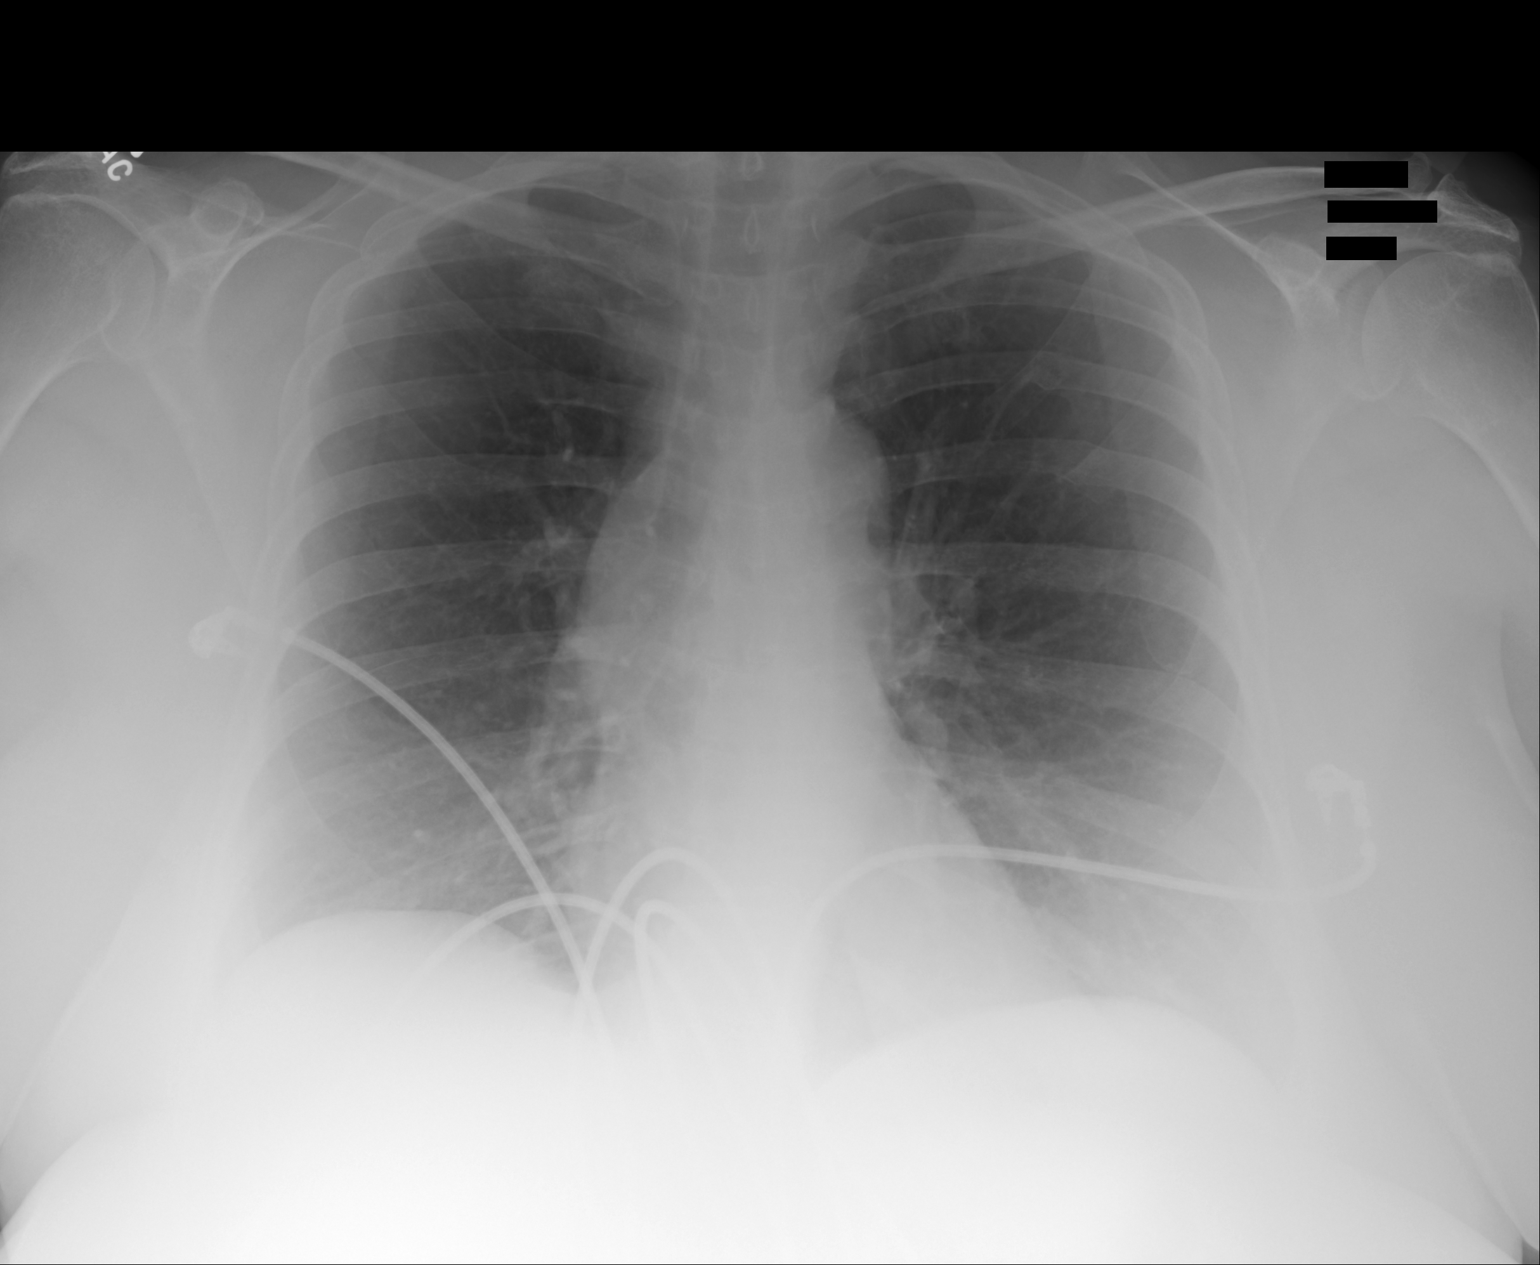

[1 of 1 positions shown; findings below may reference images not displayed]

IMPRESSION: No acute abnormality.

## 2014-08-01 ENCOUNTER — Emergency Department: Payer: Self-pay | Admitting: Emergency Medicine

## 2014-08-01 DIAGNOSIS — R0989 Other specified symptoms and signs involving the circulatory and respiratory systems: Secondary | ICD-10-CM | POA: Diagnosis not present

## 2014-08-01 DIAGNOSIS — R51 Headache: Secondary | ICD-10-CM | POA: Diagnosis not present

## 2014-08-01 DIAGNOSIS — I10 Essential (primary) hypertension: Secondary | ICD-10-CM | POA: Diagnosis not present

## 2014-08-01 DIAGNOSIS — R110 Nausea: Secondary | ICD-10-CM | POA: Diagnosis not present

## 2014-08-01 DIAGNOSIS — Z720 Tobacco use: Secondary | ICD-10-CM | POA: Diagnosis not present

## 2014-08-01 DIAGNOSIS — G8929 Other chronic pain: Secondary | ICD-10-CM | POA: Diagnosis not present

## 2014-08-01 DIAGNOSIS — R079 Chest pain, unspecified: Secondary | ICD-10-CM | POA: Diagnosis not present

## 2014-08-01 DIAGNOSIS — F329 Major depressive disorder, single episode, unspecified: Secondary | ICD-10-CM | POA: Diagnosis not present

## 2014-08-01 DIAGNOSIS — R197 Diarrhea, unspecified: Secondary | ICD-10-CM | POA: Diagnosis not present

## 2014-08-01 DIAGNOSIS — R531 Weakness: Secondary | ICD-10-CM | POA: Diagnosis not present

## 2014-08-01 DIAGNOSIS — R112 Nausea with vomiting, unspecified: Secondary | ICD-10-CM | POA: Diagnosis not present

## 2014-08-01 DIAGNOSIS — R05 Cough: Secondary | ICD-10-CM | POA: Diagnosis not present

## 2014-08-01 DIAGNOSIS — Z880 Allergy status to penicillin: Secondary | ICD-10-CM | POA: Diagnosis not present

## 2014-08-01 DIAGNOSIS — R109 Unspecified abdominal pain: Secondary | ICD-10-CM | POA: Diagnosis not present

## 2014-08-01 DIAGNOSIS — R0602 Shortness of breath: Secondary | ICD-10-CM | POA: Diagnosis not present

## 2014-08-11 DIAGNOSIS — N823 Fistula of vagina to large intestine: Secondary | ICD-10-CM | POA: Diagnosis not present

## 2014-08-11 DIAGNOSIS — G8929 Other chronic pain: Secondary | ICD-10-CM | POA: Diagnosis not present

## 2014-08-16 ENCOUNTER — Ambulatory Visit: Payer: Medicare Other | Admitting: Pharmacist Clinician (PhC)/ Clinical Pharmacy Specialist

## 2014-08-22 ENCOUNTER — Ambulatory Visit: Payer: Medicare Other

## 2014-08-22 DIAGNOSIS — I10 Essential (primary) hypertension: Secondary | ICD-10-CM | POA: Diagnosis not present

## 2014-08-23 DIAGNOSIS — I10 Essential (primary) hypertension: Secondary | ICD-10-CM | POA: Diagnosis not present

## 2014-08-24 ENCOUNTER — Other Ambulatory Visit: Payer: Medicare Other

## 2014-08-24 ENCOUNTER — Ambulatory Visit: Payer: Medicare Other | Admitting: Pharmacist Clinician (PhC)/ Clinical Pharmacy Specialist

## 2014-08-24 DIAGNOSIS — B182 Chronic viral hepatitis C: Secondary | ICD-10-CM

## 2014-08-24 DIAGNOSIS — I10 Essential (primary) hypertension: Secondary | ICD-10-CM | POA: Diagnosis not present

## 2014-08-24 DIAGNOSIS — B1710 Acute hepatitis C without hepatic coma: Secondary | ICD-10-CM

## 2014-08-24 NOTE — Progress Notes (Signed)
Patient ID: [name], female   DOB: 12-12-1955, 59 y.o.   MRN: [id number] HPI: [name] is a 59 y.o. female who is her  Lab Results  Component Value Date   HCVGENOTYPE 1a 04/11/2014    Allergies: Allergies  Allergen Reactions  . Dilaudid [Hydromorphone Hcl] Itching  . Hydromorphone Itching  . Sulfa Antibiotics Hives  . Zofran [Ondansetron Hcl] Itching  . Amoxicillin Rash  . Chocolate Rash  . Penicillins Rash  . Strawberry Rash    Vitals:    Past Medical History: Past Medical History  Diagnosis Date  . Emphysema of lung   . Depression   . Diverticulitis   . Chronic bronchitis   . Malignant brain tumor 2007  . GERD (gastroesophageal reflux disease)   . Allergy   . Hepatitis C   . Hypertension   . Chronic kidney disease   . Migraines   . Urine incontinence   . Seizures   . Stroke 2007    during brain surgery  . Pancreatitis, alcoholic 1751  . Rectovaginal fistula   . H/O ETOH abuse     Sober since 2009  . H/O drug abuse     Clean since 2009  . Pancreatic ascites   . Cirrhosis     Social History: History   Social History  . Marital Status: Married    Spouse Name: N/A  . Number of Children: N/A  . Years of Education: N/A   Social History Main Topics  . Smoking status: Current Some Day Smoker -- 0.25 packs/day    Types: Cigarettes  . Smokeless tobacco: Never Used     Comment: cutting back  . Alcohol Use: No     Comment: no alcohol since 2013  . Drug Use: No  . Sexual Activity: Yes    Birth Control/ Protection: Other-see comments     Comment: hysterectomy   Other Topics Concern  . Not on file   Social History Narrative   Lives in [place] with husband, has 4 sons (2 in prison).             Labs: HEP B S AB (no units)  Date Value  04/11/2014 NEG   HEPATITIS B SURFACE AG (no units)  Date Value  04/11/2014 NEGATIVE    Lab Results  Component Value Date   HCVGENOTYPE 1a 04/11/2014    Hepatitis C RNA  quantitative Latest Ref Rng 04/11/2014  HCV Quantitative <15 IU/mL 231766(H)  HCV Quantitative Log <1.18 log 10 5.37(H)    AST (U/L)  Date Value  06/07/2014 43*  11/24/2013 65*  11/03/2012 54*   ALT (U/L)  Date Value  06/07/2014 37*  11/24/2013 34  11/03/2012 37*   INR (no units)  Date Value  04/11/2014 1.35    CrCl: CrCl cannot be calculated (Unknown ideal weight.).  Fibrosis Score: F3/4   Child-Pugh Score: Class A  Previous Treatment Regimen: None  Assessment: She is here to pick up her meds for the 2nd month of harvoni. She is currently taking it with her 20mg  of prilosec that we changed last time. We are going to get labs today. She has not had any issues with the meds and has not missed any doses.   Recommendations: F/u with her next month to give her meds Labs for VL and [place], [place], [place].D., BCPS, AAHIVP [facility] 08/24/2014, 1:49 PM

## 2014-08-25 DIAGNOSIS — I10 Essential (primary) hypertension: Secondary | ICD-10-CM | POA: Diagnosis not present

## 2014-08-25 LAB — CBC WITH DIFFERENTIAL/PLATELET
Basophils Absolute: 0 10*3/uL (ref 0.0–0.1)
Basophils Relative: 0 % (ref 0–1)
Eosinophils Absolute: 0.1 10*3/uL (ref 0.0–0.7)
Eosinophils Relative: 2 % (ref 0–5)
HCT: 34 % — ABNORMAL LOW (ref 36.0–46.0)
HEMOGLOBIN: 10.6 g/dL — AB (ref 12.0–15.0)
LYMPHS ABS: 2.1 10*3/uL (ref 0.7–4.0)
Lymphocytes Relative: 41 % (ref 12–46)
MCH: 25 pg — ABNORMAL LOW (ref 26.0–34.0)
MCHC: 31.2 g/dL (ref 30.0–36.0)
MCV: 80.2 fL (ref 78.0–100.0)
MONO ABS: 0.5 10*3/uL (ref 0.1–1.0)
MONOS PCT: 10 % (ref 3–12)
MPV: 10.6 fL (ref 8.6–12.4)
Neutro Abs: 2.4 10*3/uL (ref 1.7–7.7)
Neutrophils Relative %: 47 % (ref 43–77)
Platelets: 132 10*3/uL — ABNORMAL LOW (ref 150–400)
RBC: 4.24 MIL/uL (ref 3.87–5.11)
RDW: 17.3 % — ABNORMAL HIGH (ref 11.5–15.5)
WBC: 5.2 10*3/uL (ref 4.0–10.5)

## 2014-08-25 LAB — COMPLETE METABOLIC PANEL WITH GFR
ALT: 16 U/L (ref 0–35)
AST: 27 U/L (ref 0–37)
Albumin: 3.3 g/dL — ABNORMAL LOW (ref 3.5–5.2)
Alkaline Phosphatase: 118 U/L — ABNORMAL HIGH (ref 39–117)
BILIRUBIN TOTAL: 1 mg/dL (ref 0.2–1.2)
BUN: 6 mg/dL (ref 6–23)
CO2: 22 meq/L (ref 19–32)
CREATININE: 0.65 mg/dL (ref 0.50–1.10)
Calcium: 8.9 mg/dL (ref 8.4–10.5)
Chloride: 105 mEq/L (ref 96–112)
Glucose, Bld: 107 mg/dL — ABNORMAL HIGH (ref 70–99)
Potassium: 3.2 mEq/L — ABNORMAL LOW (ref 3.5–5.3)
Sodium: 138 mEq/L (ref 135–145)
Total Protein: 7.7 g/dL (ref 6.0–8.3)

## 2014-08-25 LAB — HEPATITIS C RNA QUANTITATIVE: HCV Quantitative: NOT DETECTED IU/mL (ref <15)

## 2014-08-26 ENCOUNTER — Telehealth: Payer: Self-pay | Admitting: *Deleted

## 2014-08-26 ENCOUNTER — Telehealth: Payer: Self-pay | Admitting: Licensed Clinical Social Worker

## 2014-08-26 DIAGNOSIS — I10 Essential (primary) hypertension: Secondary | ICD-10-CM | POA: Diagnosis not present

## 2014-08-26 NOTE — Telephone Encounter (Signed)
-----   Message from [name], MD sent at 08/26/2014 10:24 AM EST ----- Please let her know her HCV viral load is undetectable and is working.  She should continue for the 12 weeks and follow up with me after she finishes (she has finished 4 weeks).  thanks

## 2014-08-26 NOTE — Telephone Encounter (Signed)
Relayed results and instructions to patient. She verbalized understanding, will complete the treatment as prescribed.  RN offered to make her follow up appointment, but patient declined. She is following up with pharmacist on 09/14/14.  She will need to schedule follow up then.  Patient started treatment 08/04/14. Landis Gandy, RN

## 2014-08-26 NOTE — Telephone Encounter (Signed)
Patient did not answer and her voicemail was disabled, so I could not leave a message.

## 2014-08-27 DIAGNOSIS — I10 Essential (primary) hypertension: Secondary | ICD-10-CM | POA: Diagnosis not present

## 2014-08-28 DIAGNOSIS — I10 Essential (primary) hypertension: Secondary | ICD-10-CM | POA: Diagnosis not present

## 2014-08-29 DIAGNOSIS — I10 Essential (primary) hypertension: Secondary | ICD-10-CM | POA: Diagnosis not present

## 2014-08-30 ENCOUNTER — Ambulatory Visit: Payer: Self-pay | Admitting: Obstetrics and Gynecology

## 2014-08-30 DIAGNOSIS — R161 Splenomegaly, not elsewhere classified: Secondary | ICD-10-CM | POA: Diagnosis not present

## 2014-08-30 DIAGNOSIS — B373 Candidiasis of vulva and vagina: Secondary | ICD-10-CM | POA: Diagnosis not present

## 2014-08-30 DIAGNOSIS — G8929 Other chronic pain: Secondary | ICD-10-CM | POA: Diagnosis not present

## 2014-08-30 DIAGNOSIS — I10 Essential (primary) hypertension: Secondary | ICD-10-CM | POA: Diagnosis not present

## 2014-08-30 DIAGNOSIS — K7460 Unspecified cirrhosis of liver: Secondary | ICD-10-CM | POA: Diagnosis not present

## 2014-08-30 DIAGNOSIS — N823 Fistula of vagina to large intestine: Secondary | ICD-10-CM | POA: Diagnosis not present

## 2014-08-31 DIAGNOSIS — I10 Essential (primary) hypertension: Secondary | ICD-10-CM | POA: Diagnosis not present

## 2014-09-01 DIAGNOSIS — E119 Type 2 diabetes mellitus without complications: Secondary | ICD-10-CM | POA: Diagnosis not present

## 2014-09-01 DIAGNOSIS — J449 Chronic obstructive pulmonary disease, unspecified: Secondary | ICD-10-CM | POA: Diagnosis not present

## 2014-09-01 DIAGNOSIS — I10 Essential (primary) hypertension: Secondary | ICD-10-CM | POA: Diagnosis not present

## 2014-09-01 DIAGNOSIS — N829 Female genital tract fistula, unspecified: Secondary | ICD-10-CM | POA: Diagnosis not present

## 2014-09-01 DIAGNOSIS — Z0000 Encounter for general adult medical examination without abnormal findings: Secondary | ICD-10-CM | POA: Diagnosis not present

## 2014-09-01 DIAGNOSIS — G894 Chronic pain syndrome: Secondary | ICD-10-CM | POA: Diagnosis not present

## 2014-09-01 DIAGNOSIS — E669 Obesity, unspecified: Secondary | ICD-10-CM | POA: Diagnosis not present

## 2014-09-01 DIAGNOSIS — K219 Gastro-esophageal reflux disease without esophagitis: Secondary | ICD-10-CM | POA: Diagnosis not present

## 2014-09-01 IMAGING — CR DG CHEST 1V PORT
1 series · 1 of 1 positions shown · non-contrast
Comparison: none

REASON FOR EXAM: Chest Pain
COMMENTS:

[ap]
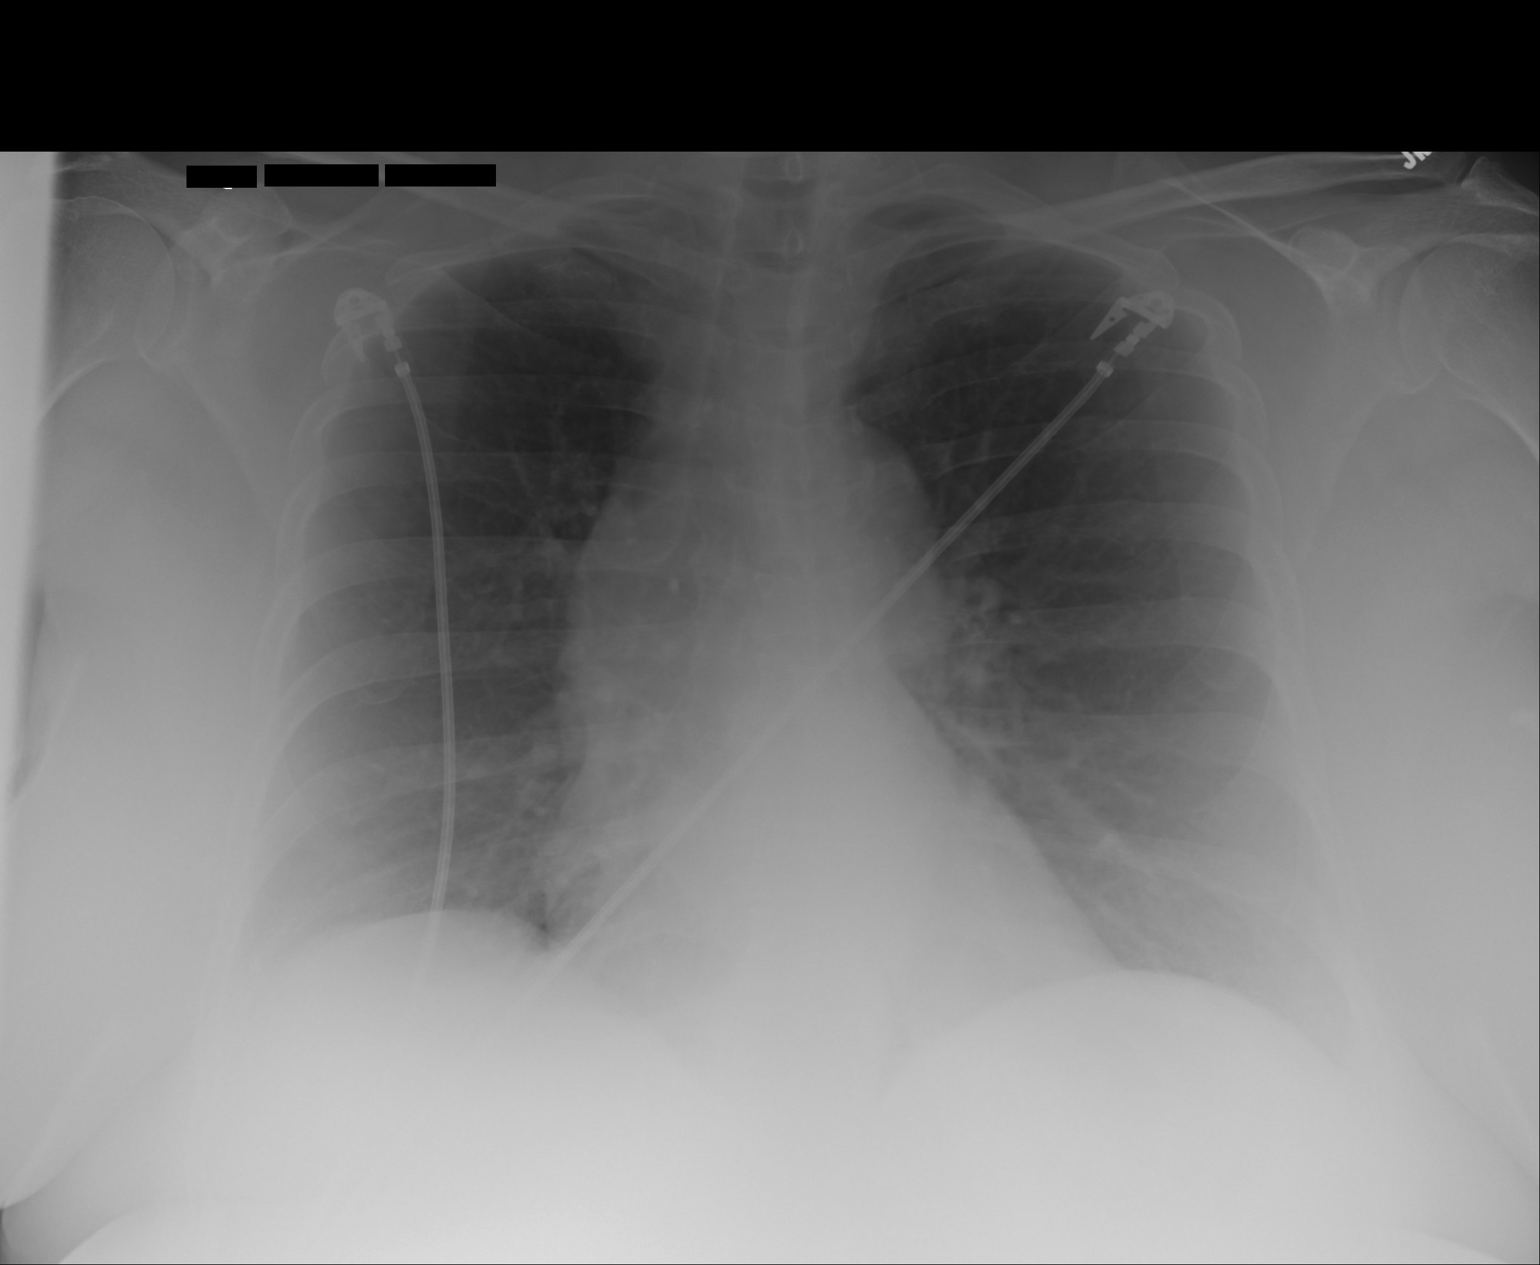

[1 of 1 positions shown; findings below may reference images not displayed]

PROCEDURE:     DXR - DXR PORTABLE CHEST SINGLE VIEW  - September 21, 2012 [DATE]

RESULT:     Comparison is made to the study August 18, 2012.

The lungs are well-expanded and clear. The cardiac silhouette is top normal
in size. There is tortuosity of the ascending and descending thoracic aorta
which is stable. The pulmonary vascularity is not engorged. There is no
pleural effusion. The mediastinum is normal in width.
IMPRESSION: There is hyperinflation consistent with COPD or reactive
airway disease. There is no evidence of pneumonia nor CHF.

[REDACTED]

## 2014-09-02 ENCOUNTER — Encounter: Payer: Self-pay | Admitting: Internal Medicine

## 2014-09-02 ENCOUNTER — Ambulatory Visit: Payer: Medicaid Other | Admitting: Podiatry

## 2014-09-02 DIAGNOSIS — I10 Essential (primary) hypertension: Secondary | ICD-10-CM | POA: Diagnosis not present

## 2014-09-02 IMAGING — CT CT CHEST W/ CM
1 series · 15 of 34 positions shown, 19 images · IV contrast (APPLIED)
Comparison: none

REASON FOR EXAM: cough dyspnea, hypoxia
COMMENTS:

[Series 4: soft tissue · axial · 0.72mm/px · z∈[-333,-90]mm · 15 of 95 slices shown, 19 images]
[im 7/95  mediastinal]
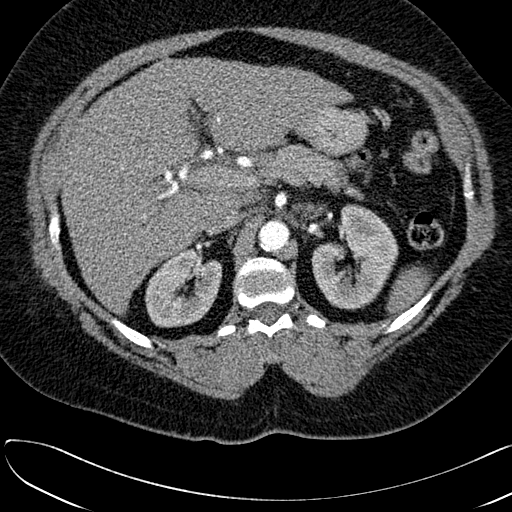
[im 7/95  lung]
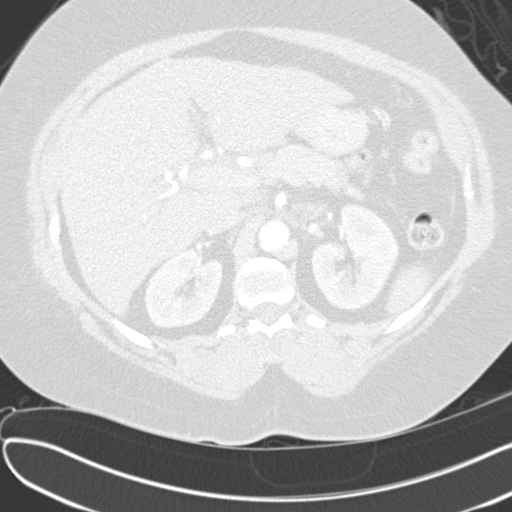
[im 14/95  lung]
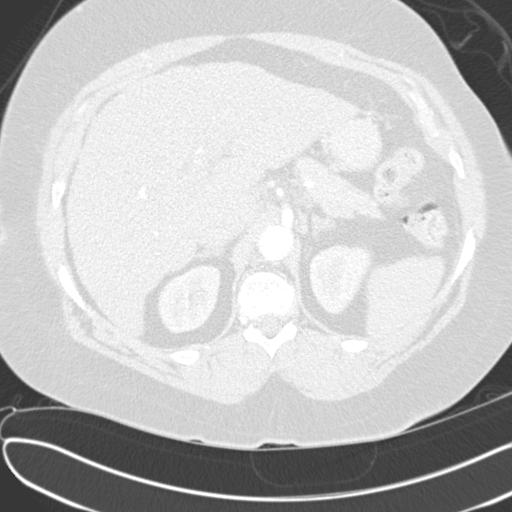
[im 19/95  lung]
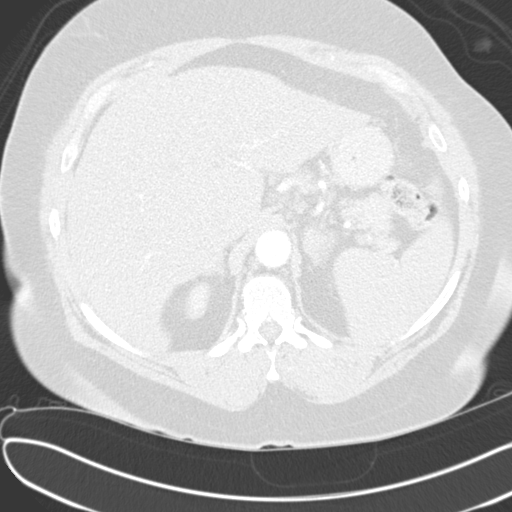
[im 25/95  lung]
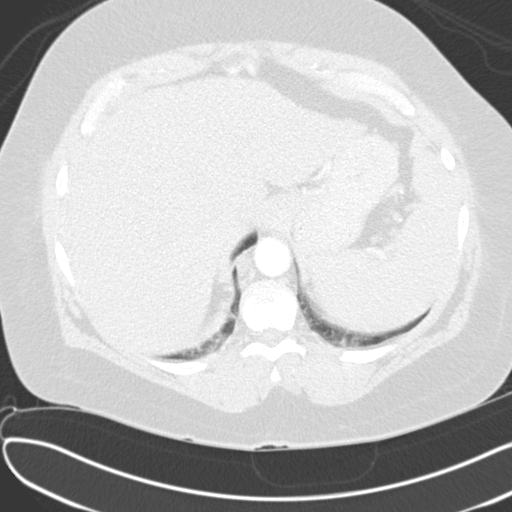
[im 32/95  mediastinal]
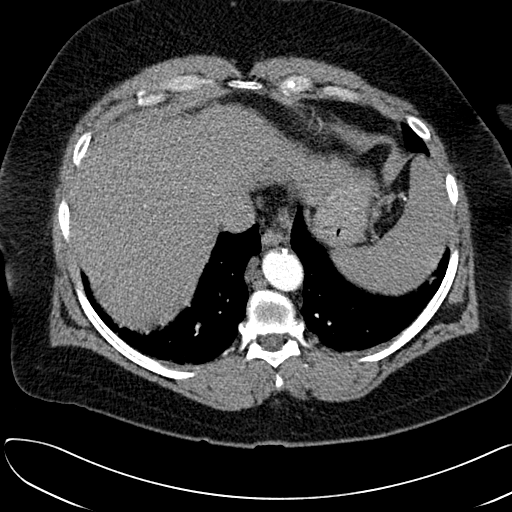
[im 32/95  lung]
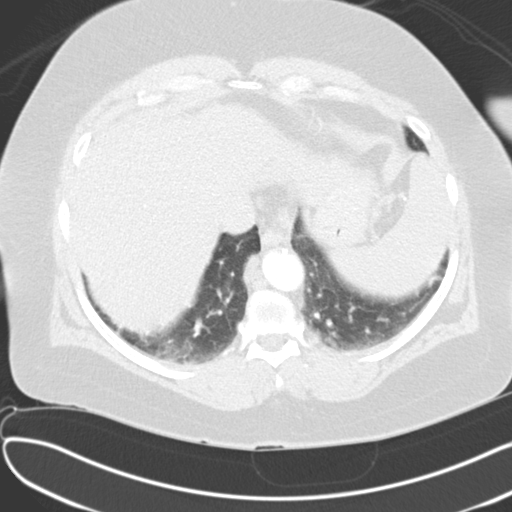
[im 38/95  lung]
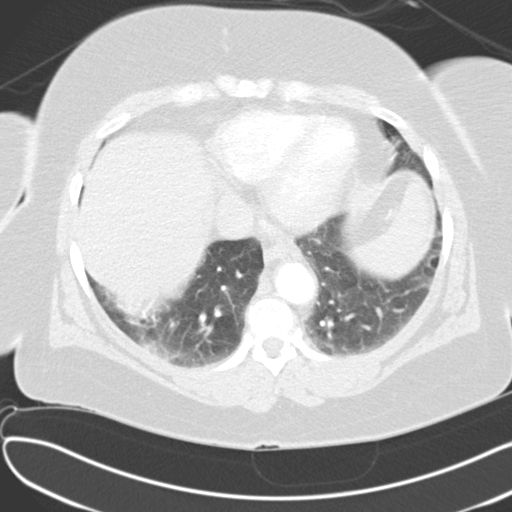
[im 42/95  lung]
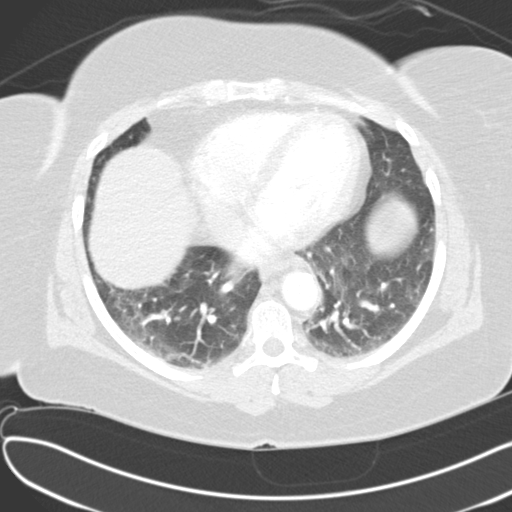
[im 49/95  lung]
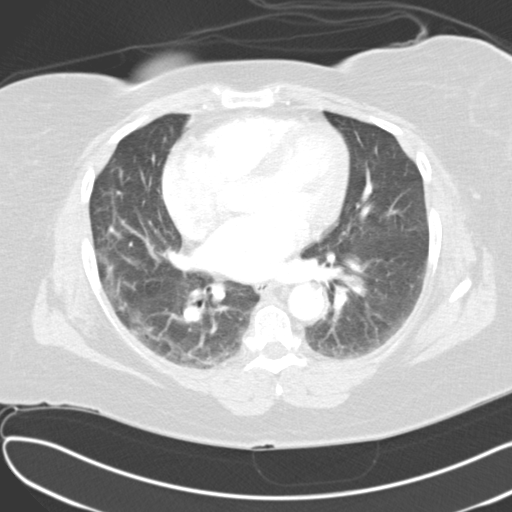
[im 53/95  mediastinal]
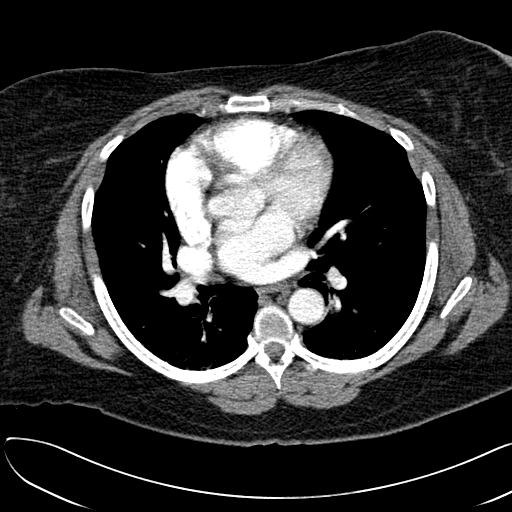
[im 53/95  lung]
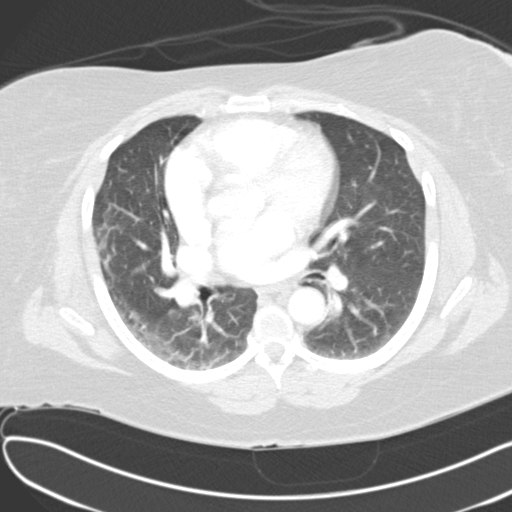
[im 57/95  lung]
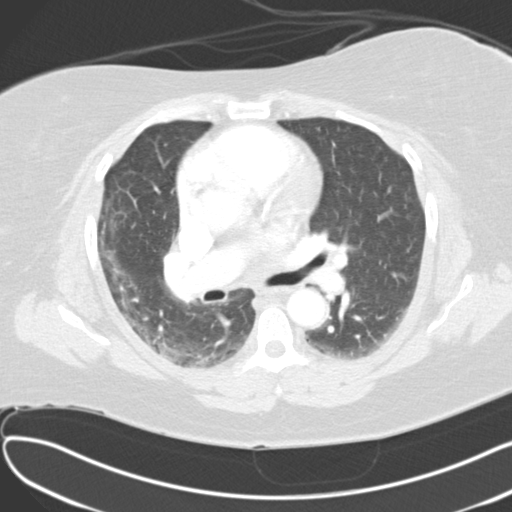
[im 63/95  lung]
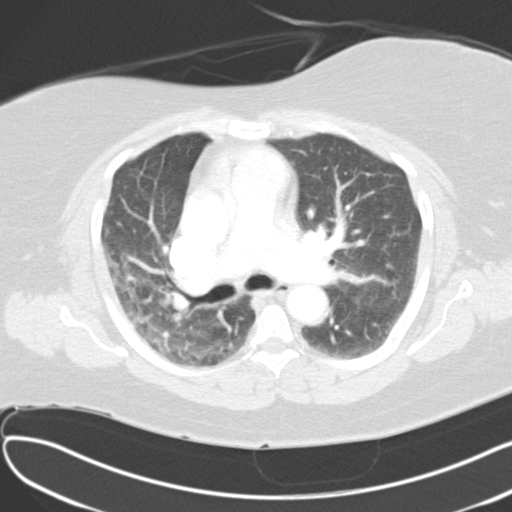
[im 70/95  lung]
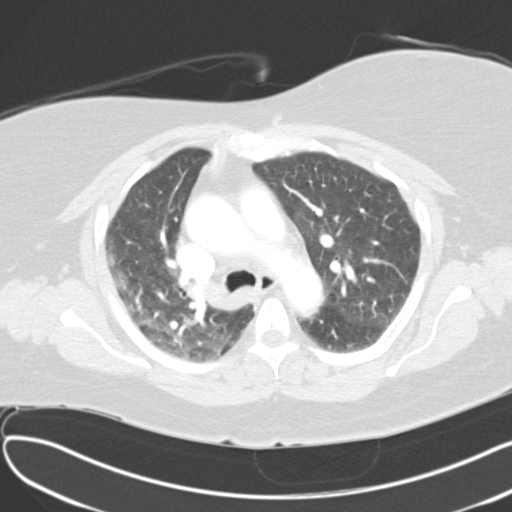
[im 76/95  mediastinal]
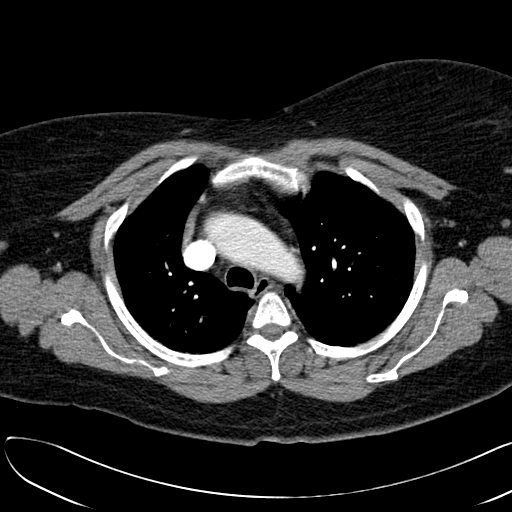
[im 76/95  lung]
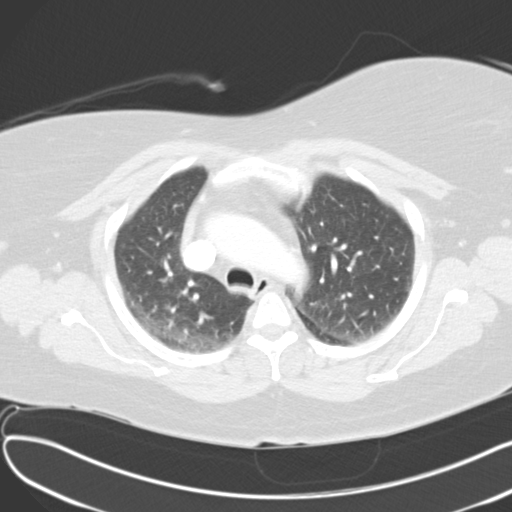
[im 81/95  lung]
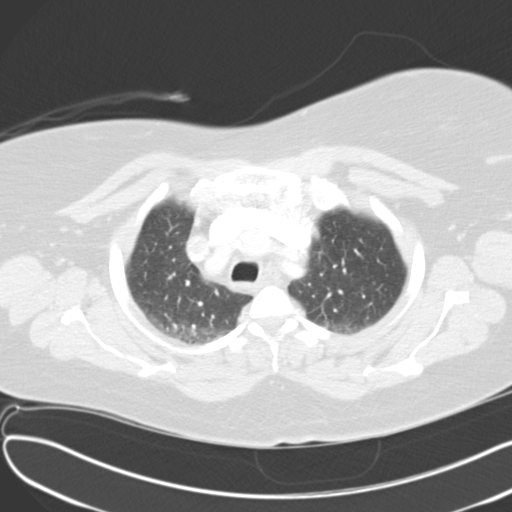
[im 88/95  lung]
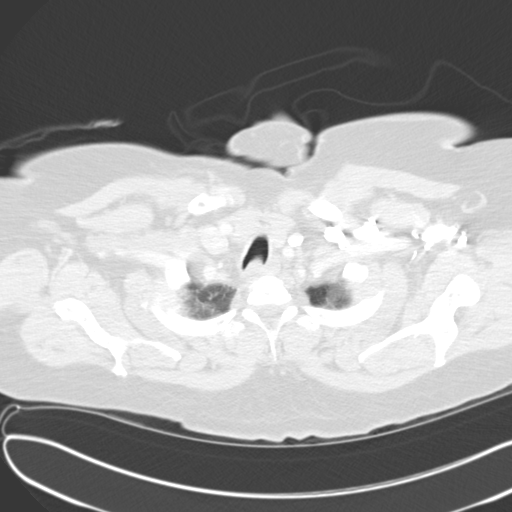

[15 of 34 positions shown; findings below may reference images not displayed]

PROCEDURE:     CT  - CT CHEST (FOR PE) W  - September 22, 2012  [DATE]

RESULT:     Axial CT scanning was performed through the chest with
reconstructions at 3 mm intervals and slice thicknesses following
intravenous administration of 80 cc of [id number]. Review of multiplanar
reconstructed images was performed separately on the VIA monitor. Comparison
is made to the study December 21, 2009.

The cardiac chambers are normal in size. The caliber of the thoracic aorta
is normal. Contrast within the visualized portions of the pulmonary arterial
tree is normal in appearance. The study was not specifically tailored for
pulmonary arterial tree evaluation. There is no pleural nor pericardial
effusion. There is no mediastinal or hilar or axillary lymphadenopathy.

At lung window settings there is compressive atelectasis posteriorly in both
lungs. There is no alveolar infiltrate. No suspicious pulmonary nodules are
demonstrated.

Within the upper abdomen the observed portions of the liver and spleen
appear normal. There are hypodensities in the pancreatic head most
compatible with pseudocysts related to previous inflammatory changes. Only a
small portion of the pancreas is included in the field-of-view today. There
is a stable 1.8 cm diameter left adrenal mass.
IMPRESSION: 1. There is compressive atelectasis within both lungs posteriorly. There is
no focal pneumonia or parenchymal mass.
2. Evaluation of the pulmonary arterial tree is limited but no filling
defects are demonstrated.
3. There is no evidence of CHF.
4. There is no mediastinal nor hilar lymphadenopathy.
5. There are chronic changes associated with the pancreatic head consistent
with a history of pancreatitis. The entire pancreas is not included in the
field-of-view.
6. There is a stable 1.8 cm diameter left adrenal mass.

A preliminary report was sent to the [HOSPITAL] the conclusion
of the study.

[REDACTED]

## 2014-09-03 DIAGNOSIS — I10 Essential (primary) hypertension: Secondary | ICD-10-CM | POA: Diagnosis not present

## 2014-09-04 DIAGNOSIS — I10 Essential (primary) hypertension: Secondary | ICD-10-CM | POA: Diagnosis not present

## 2014-09-05 DIAGNOSIS — I10 Essential (primary) hypertension: Secondary | ICD-10-CM | POA: Diagnosis not present

## 2014-09-06 DIAGNOSIS — I10 Essential (primary) hypertension: Secondary | ICD-10-CM | POA: Diagnosis not present

## 2014-09-07 DIAGNOSIS — I10 Essential (primary) hypertension: Secondary | ICD-10-CM | POA: Diagnosis not present

## 2014-09-07 DIAGNOSIS — G893 Neoplasm related pain (acute) (chronic): Secondary | ICD-10-CM | POA: Diagnosis not present

## 2014-09-07 DIAGNOSIS — Z79891 Long term (current) use of opiate analgesic: Secondary | ICD-10-CM | POA: Diagnosis not present

## 2014-09-07 DIAGNOSIS — G894 Chronic pain syndrome: Secondary | ICD-10-CM | POA: Diagnosis not present

## 2014-09-07 DIAGNOSIS — Z5181 Encounter for therapeutic drug level monitoring: Secondary | ICD-10-CM | POA: Diagnosis not present

## 2014-09-07 DIAGNOSIS — M545 Low back pain: Secondary | ICD-10-CM | POA: Diagnosis not present

## 2014-09-08 DIAGNOSIS — I10 Essential (primary) hypertension: Secondary | ICD-10-CM | POA: Diagnosis not present

## 2014-09-09 DIAGNOSIS — I10 Essential (primary) hypertension: Secondary | ICD-10-CM | POA: Diagnosis not present

## 2014-09-10 DIAGNOSIS — I10 Essential (primary) hypertension: Secondary | ICD-10-CM | POA: Diagnosis not present

## 2014-09-11 DIAGNOSIS — I10 Essential (primary) hypertension: Secondary | ICD-10-CM | POA: Diagnosis not present

## 2014-09-12 DIAGNOSIS — I10 Essential (primary) hypertension: Secondary | ICD-10-CM | POA: Diagnosis not present

## 2014-09-13 DIAGNOSIS — R399 Unspecified symptoms and signs involving the genitourinary system: Secondary | ICD-10-CM | POA: Diagnosis not present

## 2014-09-13 DIAGNOSIS — G8929 Other chronic pain: Secondary | ICD-10-CM | POA: Diagnosis not present

## 2014-09-13 DIAGNOSIS — I10 Essential (primary) hypertension: Secondary | ICD-10-CM | POA: Diagnosis not present

## 2014-09-13 DIAGNOSIS — N823 Fistula of vagina to large intestine: Secondary | ICD-10-CM | POA: Diagnosis not present

## 2014-09-14 ENCOUNTER — Ambulatory Visit: Payer: Medicare Other

## 2014-09-14 DIAGNOSIS — I10 Essential (primary) hypertension: Secondary | ICD-10-CM | POA: Diagnosis not present

## 2014-09-15 DIAGNOSIS — I10 Essential (primary) hypertension: Secondary | ICD-10-CM | POA: Diagnosis not present

## 2014-09-16 DIAGNOSIS — I10 Essential (primary) hypertension: Secondary | ICD-10-CM | POA: Diagnosis not present

## 2014-09-16 DIAGNOSIS — R399 Unspecified symptoms and signs involving the genitourinary system: Secondary | ICD-10-CM | POA: Diagnosis not present

## 2014-09-17 DIAGNOSIS — I10 Essential (primary) hypertension: Secondary | ICD-10-CM | POA: Diagnosis not present

## 2014-09-17 IMAGING — CT CT HEAD WITHOUT CONTRAST
1 series · 16 of 29 positions shown, 20 images · non-contrast
Comparison: none

REASON FOR EXAM: HA; h/o brain tumor
COMMENTS:

[Series 2: soft tissue · axial · 0.42mm/px · z∈[+324,+454]mm · 16 of 29 slices shown, 20 images]
[im 2/29  brain]
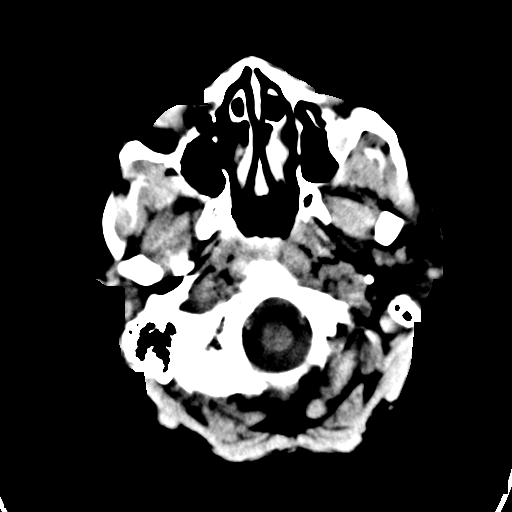
[im 2/29  bone]
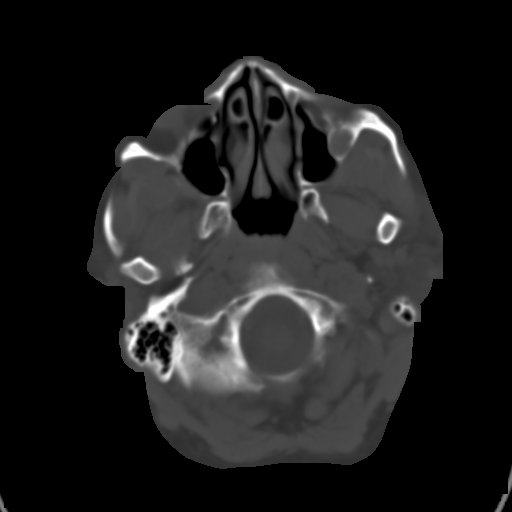
[im 4/29  brain]
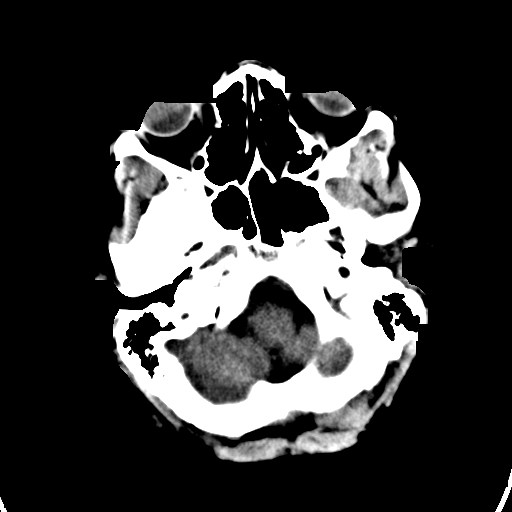
[im 6/29  brain]
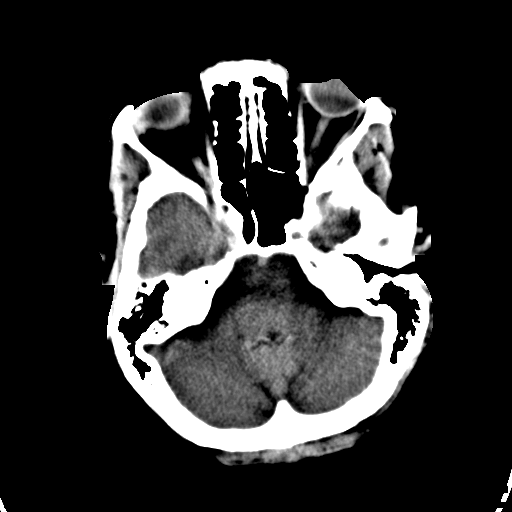
[im 7/29  brain]
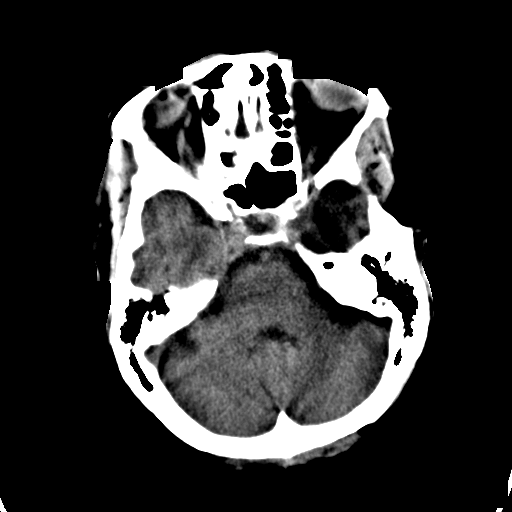
[im 9/29  brain]
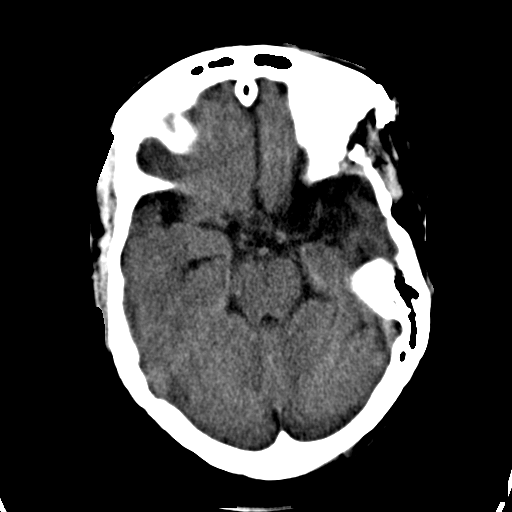
[im 9/29  bone]
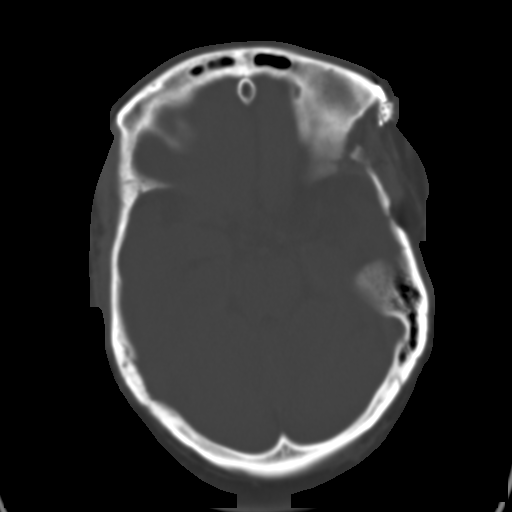
[im 11/29  brain]
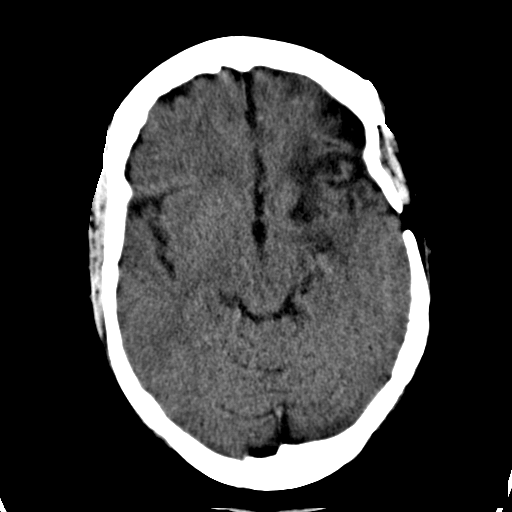
[im 12/29  brain]
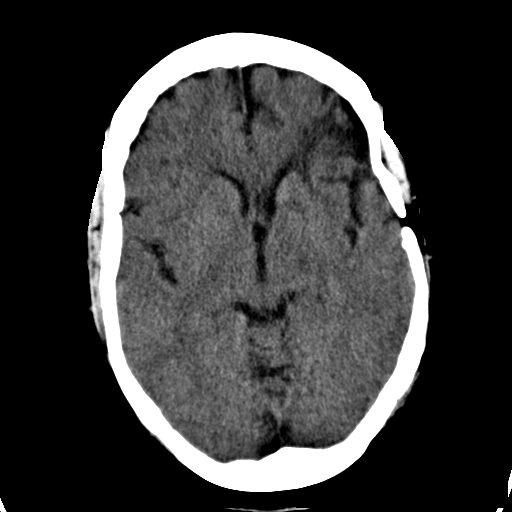
[im 14/29  brain]
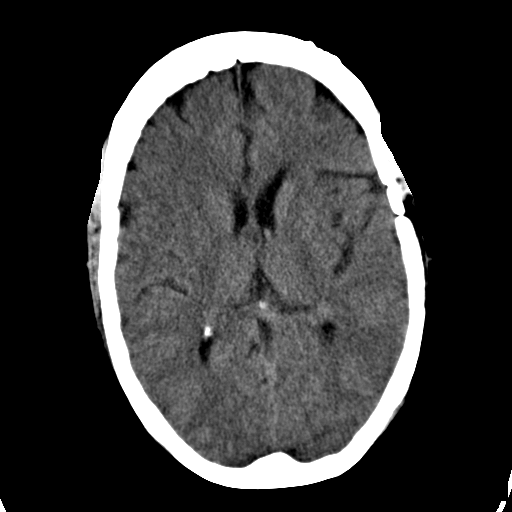
[im 16/29  brain]
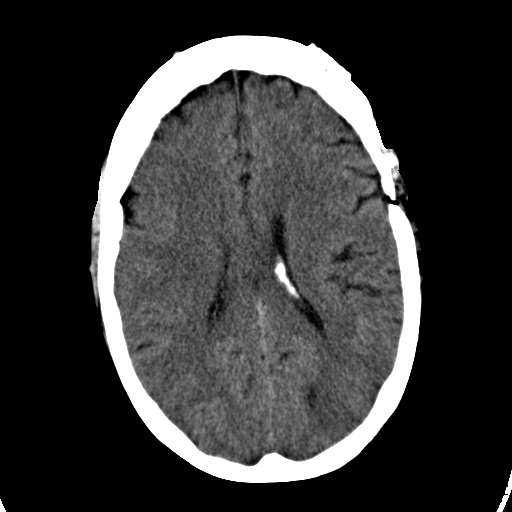
[im 16/29  bone]
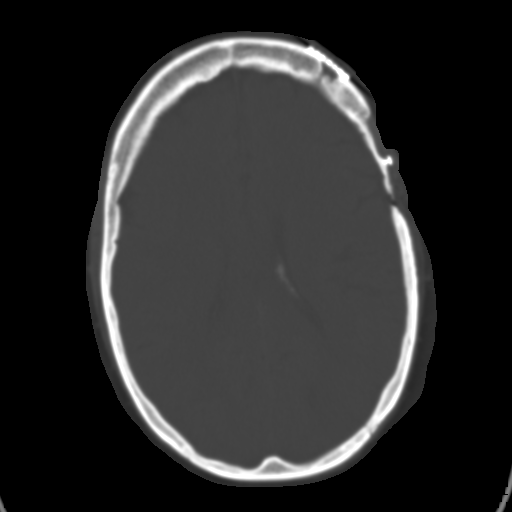
[im 18/29  brain]
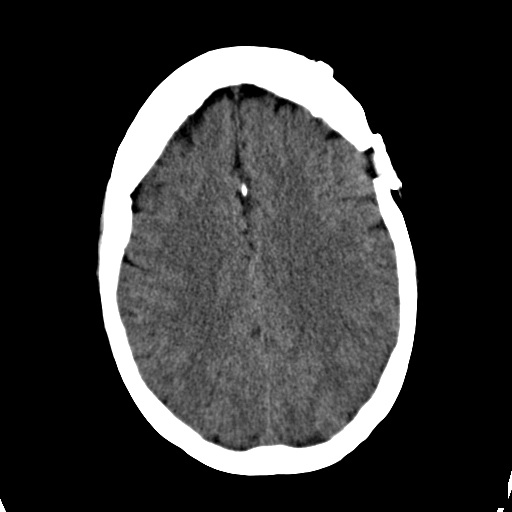
[im 19/29  brain]
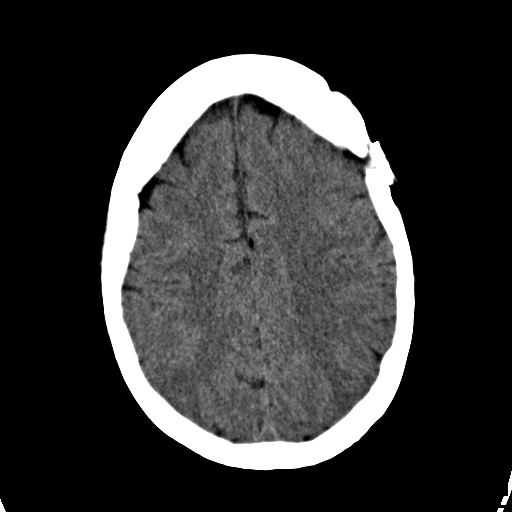
[im 21/29  brain]
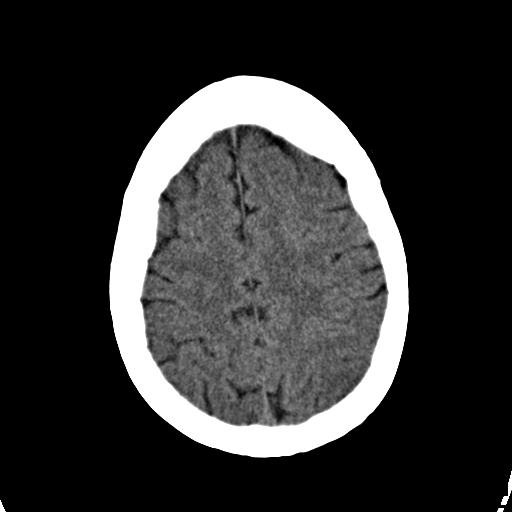
[im 23/29  brain]
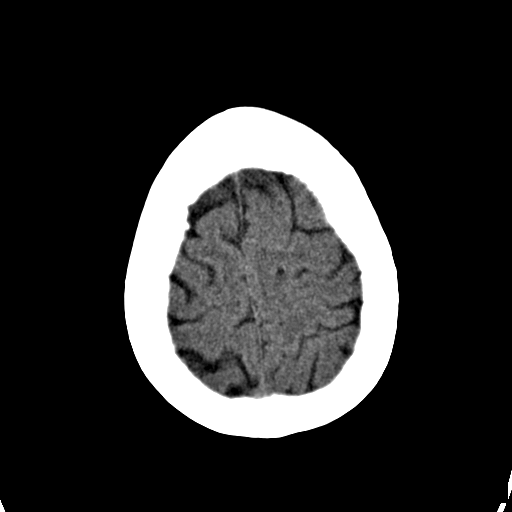
[im 23/29  bone]
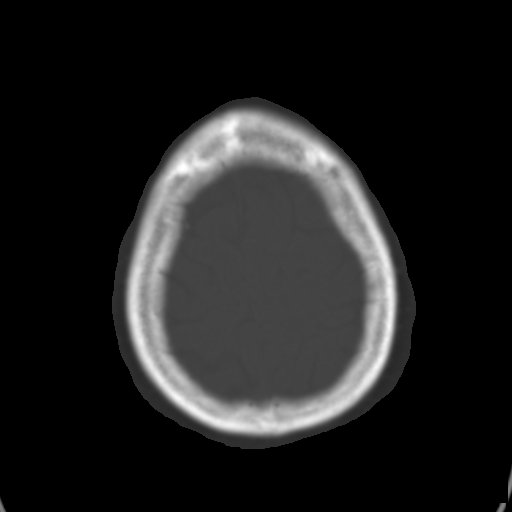
[im 24/29  brain]
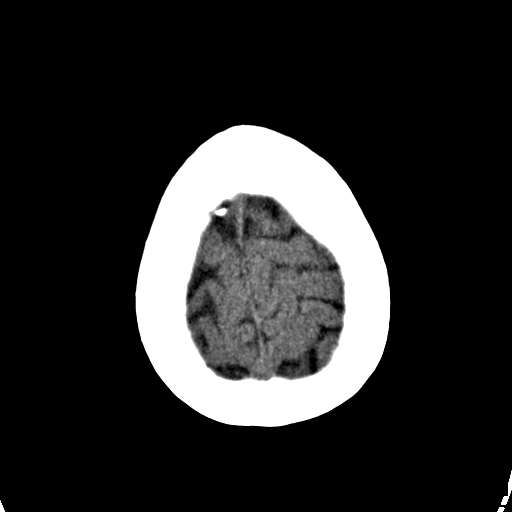
[im 26/29  brain]
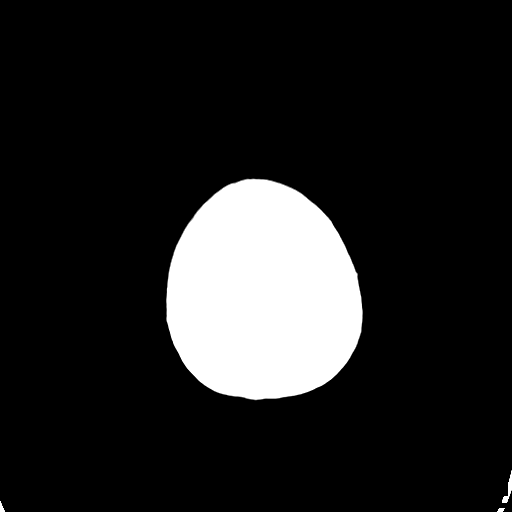
[im 28/29  brain]
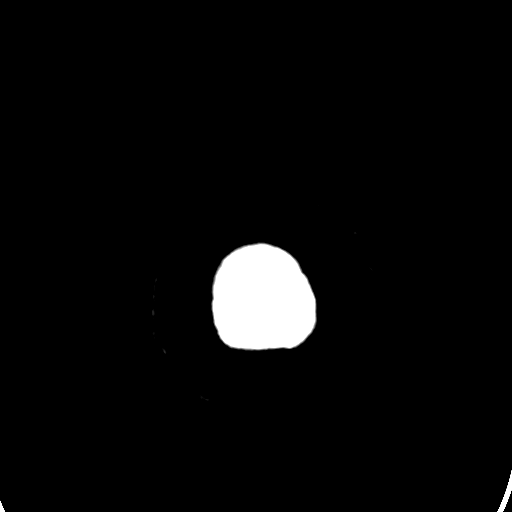

[16 of 29 positions shown; findings below may reference images not displayed]

PROCEDURE:     CT  - CT HEAD WITHOUT CONTRAST  - October 07, 2012  [DATE]

RESULT:     Noncontrast CT of the brain is compared to previous examination
dated 30 July, 2011. There is a calvarial defect over the left
frontotemporal region with underlying encephalomalacia in the anterior
cranial fossa inferolaterally toward the left and in the tip of the middle
cranial fossa. This is unchanged. The ventricles and sulci are otherwise
unremarkable. There is no intracranial hemorrhage, mass effect or midline
shift. The included sinuses show normal aeration. No acute calvarial defect
is demonstrated.
IMPRESSION: Postoperative changes over the left frontal and temporal
region with left frontal and temporal encephalomalacia inferiorly. No
significant interval change or acute intracranial abnormality evident.

[REDACTED]

## 2014-09-18 DIAGNOSIS — I10 Essential (primary) hypertension: Secondary | ICD-10-CM | POA: Diagnosis not present

## 2014-09-19 DIAGNOSIS — I10 Essential (primary) hypertension: Secondary | ICD-10-CM | POA: Diagnosis not present

## 2014-09-20 ENCOUNTER — Ambulatory Visit: Payer: Medicare Other | Admitting: Pharmacist

## 2014-09-20 DIAGNOSIS — K5730 Diverticulosis of large intestine without perforation or abscess without bleeding: Secondary | ICD-10-CM | POA: Diagnosis not present

## 2014-09-20 DIAGNOSIS — R1030 Lower abdominal pain, unspecified: Secondary | ICD-10-CM | POA: Diagnosis not present

## 2014-09-20 DIAGNOSIS — E669 Obesity, unspecified: Secondary | ICD-10-CM | POA: Diagnosis not present

## 2014-09-20 DIAGNOSIS — Z716 Tobacco abuse counseling: Secondary | ICD-10-CM | POA: Diagnosis not present

## 2014-09-20 DIAGNOSIS — K7460 Unspecified cirrhosis of liver: Secondary | ICD-10-CM

## 2014-09-20 DIAGNOSIS — I10 Essential (primary) hypertension: Secondary | ICD-10-CM | POA: Diagnosis not present

## 2014-09-20 DIAGNOSIS — J449 Chronic obstructive pulmonary disease, unspecified: Secondary | ICD-10-CM | POA: Diagnosis not present

## 2014-09-20 DIAGNOSIS — F17200 Nicotine dependence, unspecified, uncomplicated: Secondary | ICD-10-CM | POA: Diagnosis not present

## 2014-09-20 DIAGNOSIS — E049 Nontoxic goiter, unspecified: Secondary | ICD-10-CM | POA: Diagnosis not present

## 2014-09-20 DIAGNOSIS — Z79899 Other long term (current) drug therapy: Secondary | ICD-10-CM | POA: Diagnosis not present

## 2014-09-20 NOTE — Progress Notes (Signed)
HPI: Shontell Prosser is a 59 y.o. female who presents to the [facility] today to see the pharmacist for follow-up of her Hepatitis C medication.  Lab Results  Component Value Date   HCVGENOTYPE 1a 04/11/2014    Allergies: Allergies  Allergen Reactions  . Dilaudid [Hydromorphone Hcl] Itching  . Hydromorphone Itching  . Sulfa Antibiotics Hives  . Zofran [Ondansetron Hcl] Itching  . Amoxicillin Rash  . Chocolate Rash  . Penicillins Rash  . Strawberry Rash    Vitals:    Past Medical History: Past Medical History  Diagnosis Date  . Emphysema of lung   . Depression   . Diverticulitis   . Chronic bronchitis   . Malignant brain tumor 2007  . GERD (gastroesophageal reflux disease)   . Allergy   . Hepatitis C   . Hypertension   . Chronic kidney disease   . Migraines   . Urine incontinence   . Seizures   . Stroke 2007    during brain surgery  . Pancreatitis, alcoholic 3154  . Rectovaginal fistula   . H/O ETOH abuse     Sober since 2009  . H/O drug abuse     Clean since 2009  . Pancreatic ascites   . Cirrhosis     Social History: History   Social History  . Marital Status: Married    Spouse Name: N/A  . Number of Children: N/A  . Years of Education: N/A   Social History Main Topics  . Smoking status: Current Some Day Smoker -- 0.25 packs/day    Types: Cigarettes  . Smokeless tobacco: Never Used     Comment: cutting back  . Alcohol Use: No     Comment: no alcohol since 2013  . Drug Use: No  . Sexual Activity: Yes    Birth Control/ Protection: Other-see comments     Comment: hysterectomy   Other Topics Concern  . Not on file   Social History Narrative   Lives in [place] with husband, has 4 sons (2 in prison).             Labs: HEP B S AB (no units)  Date Value  04/11/2014 NEG   HEPATITIS B SURFACE AG (no units)  Date Value  04/11/2014 NEGATIVE    Lab Results  Component Value Date   HCVGENOTYPE 1a 04/11/2014    Hepatitis C RNA  quantitative Latest Ref Rng 08/24/2014 04/11/2014  HCV Quantitative <15 IU/mL Not Detected 231766(H)  HCV Quantitative Log <1.18 log 10 NOT CALC 5.37(H)    AST (U/L)  Date Value  08/24/2014 27  06/07/2014 43*  11/24/2013 65*   ALT (U/L)  Date Value  08/24/2014 16  06/07/2014 37*  11/24/2013 34   INR (no units)  Date Value  04/11/2014 1.35    CrCl: CrCl cannot be calculated (Unknown ideal weight.).   Assessment: Jamekia is a 59 yo f who presents to the [facility] today to see the pharmacist about her third bottle of Harvoni.  She explains that she has not missed a single dose of her Hep C medication so far.  She explains that she is not having any trouble taking the medication, tolerating the medication or remembering to take the medication. She does state that she is having some GI issues and needs to be referred to a GI specialist from her PCP.  I gave her the third bottle of Harvoni and encouraged her to keep doing what she has been doing.  Recommendations: Continue Harvoni  for one more month Follow-up at the end of treatment with Dr. Link Snuffer L. Lamont Snowball.D. Clinical Infectious Disease [place] [facility] 09/20/2014, 1:47 PM

## 2014-09-21 DIAGNOSIS — I10 Essential (primary) hypertension: Secondary | ICD-10-CM | POA: Diagnosis not present

## 2014-09-22 DIAGNOSIS — I10 Essential (primary) hypertension: Secondary | ICD-10-CM | POA: Diagnosis not present

## 2014-09-23 DIAGNOSIS — I10 Essential (primary) hypertension: Secondary | ICD-10-CM | POA: Diagnosis not present

## 2014-09-23 DIAGNOSIS — R079 Chest pain, unspecified: Secondary | ICD-10-CM | POA: Diagnosis not present

## 2014-09-24 ENCOUNTER — Emergency Department: Payer: Self-pay | Admitting: Emergency Medicine

## 2014-09-24 DIAGNOSIS — R51 Headache: Secondary | ICD-10-CM | POA: Diagnosis not present

## 2014-09-24 DIAGNOSIS — E119 Type 2 diabetes mellitus without complications: Secondary | ICD-10-CM | POA: Diagnosis not present

## 2014-09-24 DIAGNOSIS — R0602 Shortness of breath: Secondary | ICD-10-CM | POA: Diagnosis not present

## 2014-09-24 DIAGNOSIS — Z720 Tobacco use: Secondary | ICD-10-CM | POA: Diagnosis not present

## 2014-09-24 DIAGNOSIS — I10 Essential (primary) hypertension: Secondary | ICD-10-CM | POA: Diagnosis not present

## 2014-09-24 DIAGNOSIS — Z880 Allergy status to penicillin: Secondary | ICD-10-CM | POA: Diagnosis not present

## 2014-09-24 DIAGNOSIS — R079 Chest pain, unspecified: Secondary | ICD-10-CM | POA: Diagnosis not present

## 2014-09-24 DIAGNOSIS — R110 Nausea: Secondary | ICD-10-CM | POA: Diagnosis not present

## 2014-09-24 DIAGNOSIS — R112 Nausea with vomiting, unspecified: Secondary | ICD-10-CM | POA: Diagnosis not present

## 2014-09-25 DIAGNOSIS — I10 Essential (primary) hypertension: Secondary | ICD-10-CM | POA: Diagnosis not present

## 2014-09-26 DIAGNOSIS — I10 Essential (primary) hypertension: Secondary | ICD-10-CM | POA: Diagnosis not present

## 2014-09-27 DIAGNOSIS — B9620 Unspecified Escherichia coli [E. coli] as the cause of diseases classified elsewhere: Secondary | ICD-10-CM | POA: Diagnosis not present

## 2014-09-27 DIAGNOSIS — N823 Fistula of vagina to large intestine: Secondary | ICD-10-CM | POA: Diagnosis not present

## 2014-09-27 DIAGNOSIS — B373 Candidiasis of vulva and vagina: Secondary | ICD-10-CM | POA: Diagnosis not present

## 2014-09-27 DIAGNOSIS — N941 Dyspareunia: Secondary | ICD-10-CM | POA: Diagnosis not present

## 2014-09-27 DIAGNOSIS — I10 Essential (primary) hypertension: Secondary | ICD-10-CM | POA: Diagnosis not present

## 2014-09-27 DIAGNOSIS — N390 Urinary tract infection, site not specified: Secondary | ICD-10-CM | POA: Diagnosis not present

## 2014-09-28 DIAGNOSIS — I10 Essential (primary) hypertension: Secondary | ICD-10-CM | POA: Diagnosis not present

## 2014-09-29 DIAGNOSIS — I10 Essential (primary) hypertension: Secondary | ICD-10-CM | POA: Diagnosis not present

## 2014-09-30 ENCOUNTER — Observation Stay: Payer: Self-pay | Admitting: Internal Medicine

## 2014-09-30 DIAGNOSIS — G893 Neoplasm related pain (acute) (chronic): Secondary | ICD-10-CM | POA: Diagnosis not present

## 2014-09-30 DIAGNOSIS — J440 Chronic obstructive pulmonary disease with acute lower respiratory infection: Secondary | ICD-10-CM | POA: Diagnosis not present

## 2014-09-30 DIAGNOSIS — K920 Hematemesis: Secondary | ICD-10-CM | POA: Diagnosis not present

## 2014-09-30 DIAGNOSIS — K7030 Alcoholic cirrhosis of liver without ascites: Secondary | ICD-10-CM | POA: Diagnosis not present

## 2014-09-30 DIAGNOSIS — Z886 Allergy status to analgesic agent status: Secondary | ICD-10-CM | POA: Diagnosis not present

## 2014-09-30 DIAGNOSIS — R000 Tachycardia, unspecified: Secondary | ICD-10-CM | POA: Diagnosis not present

## 2014-09-30 DIAGNOSIS — Z888 Allergy status to other drugs, medicaments and biological substances status: Secondary | ICD-10-CM | POA: Diagnosis not present

## 2014-09-30 DIAGNOSIS — I2510 Atherosclerotic heart disease of native coronary artery without angina pectoris: Secondary | ICD-10-CM | POA: Diagnosis not present

## 2014-09-30 DIAGNOSIS — J209 Acute bronchitis, unspecified: Secondary | ICD-10-CM | POA: Diagnosis not present

## 2014-09-30 DIAGNOSIS — R0602 Shortness of breath: Secondary | ICD-10-CM | POA: Diagnosis not present

## 2014-09-30 DIAGNOSIS — Z8673 Personal history of transient ischemic attack (TIA), and cerebral infarction without residual deficits: Secondary | ICD-10-CM | POA: Diagnosis not present

## 2014-09-30 DIAGNOSIS — M545 Low back pain: Secondary | ICD-10-CM | POA: Diagnosis not present

## 2014-09-30 DIAGNOSIS — Z882 Allergy status to sulfonamides status: Secondary | ICD-10-CM | POA: Diagnosis not present

## 2014-09-30 DIAGNOSIS — Z79899 Other long term (current) drug therapy: Secondary | ICD-10-CM | POA: Diagnosis not present

## 2014-09-30 DIAGNOSIS — G894 Chronic pain syndrome: Secondary | ICD-10-CM | POA: Diagnosis not present

## 2014-09-30 DIAGNOSIS — R079 Chest pain, unspecified: Secondary | ICD-10-CM | POA: Diagnosis not present

## 2014-09-30 DIAGNOSIS — N828 Other female genital tract fistulae: Secondary | ICD-10-CM | POA: Diagnosis not present

## 2014-09-30 DIAGNOSIS — G40909 Epilepsy, unspecified, not intractable, without status epilepticus: Secondary | ICD-10-CM | POA: Diagnosis not present

## 2014-09-30 DIAGNOSIS — Z5181 Encounter for therapeutic drug level monitoring: Secondary | ICD-10-CM | POA: Diagnosis not present

## 2014-09-30 DIAGNOSIS — E876 Hypokalemia: Secondary | ICD-10-CM | POA: Diagnosis not present

## 2014-09-30 DIAGNOSIS — J189 Pneumonia, unspecified organism: Secondary | ICD-10-CM | POA: Diagnosis not present

## 2014-09-30 DIAGNOSIS — J441 Chronic obstructive pulmonary disease with (acute) exacerbation: Secondary | ICD-10-CM | POA: Diagnosis not present

## 2014-09-30 DIAGNOSIS — Z79891 Long term (current) use of opiate analgesic: Secondary | ICD-10-CM | POA: Diagnosis not present

## 2014-09-30 DIAGNOSIS — Z87891 Personal history of nicotine dependence: Secondary | ICD-10-CM | POA: Diagnosis not present

## 2014-09-30 DIAGNOSIS — B1920 Unspecified viral hepatitis C without hepatic coma: Secondary | ICD-10-CM | POA: Diagnosis not present

## 2014-09-30 DIAGNOSIS — Z880 Allergy status to penicillin: Secondary | ICD-10-CM | POA: Diagnosis not present

## 2014-09-30 DIAGNOSIS — Z7951 Long term (current) use of inhaled steroids: Secondary | ICD-10-CM | POA: Diagnosis not present

## 2014-09-30 DIAGNOSIS — I10 Essential (primary) hypertension: Secondary | ICD-10-CM | POA: Diagnosis not present

## 2014-09-30 DIAGNOSIS — Z91018 Allergy to other foods: Secondary | ICD-10-CM | POA: Diagnosis not present

## 2014-09-30 DIAGNOSIS — R042 Hemoptysis: Secondary | ICD-10-CM | POA: Diagnosis not present

## 2014-09-30 DIAGNOSIS — E119 Type 2 diabetes mellitus without complications: Secondary | ICD-10-CM | POA: Diagnosis not present

## 2014-09-30 LAB — URINALYSIS, COMPLETE
Bilirubin,UR: NEGATIVE
Glucose,UR: NEGATIVE mg/dL (ref 0–75)
Ketone: NEGATIVE
Leukocyte Esterase: NEGATIVE
Nitrite: NEGATIVE
Ph: 6 (ref 4.5–8.0)
Protein: NEGATIVE
RBC,UR: <1 /HPF (ref 0–5)
Specific Gravity: 1.003 (ref 1.003–1.030)
Squamous Epithelial: 3
WBC UR: 2 /HPF (ref 0–5)

## 2014-09-30 LAB — COMPREHENSIVE METABOLIC PANEL
ANION GAP: 12 (ref 7–16)
Albumin: 3.7 g/dL
Alkaline Phosphatase: 121 U/L
BUN: 7 mg/dL
Bilirubin,Total: 1.6 mg/dL — ABNORMAL HIGH
CALCIUM: 9 mg/dL
Chloride: 105 mmol/L
Co2: 22 mmol/L
Creatinine: 0.86 mg/dL
EGFR (Non-African Amer.): >60
Glucose: 113 mg/dL — ABNORMAL HIGH
Potassium: 2.8 mmol/L — ABNORMAL LOW
SGOT(AST): 38 U/L
SGPT (ALT): 16 U/L
Sodium: 139 mmol/L
Total Protein: 8.7 g/dL — ABNORMAL HIGH

## 2014-09-30 LAB — CBC
HCT: 34.6 % — ABNORMAL LOW (ref 35.0–47.0)
HGB: 11 g/dL — ABNORMAL LOW (ref 12.0–16.0)
MCH: 25.7 pg — ABNORMAL LOW (ref 26.0–34.0)
MCHC: 31.7 g/dL — ABNORMAL LOW (ref 32.0–36.0)
MCV: 81 fL (ref 80–100)
Platelet: 195 10*3/uL (ref 150–440)
RBC: 4.28 10*6/uL (ref 3.80–5.20)
RDW: 18.9 % — ABNORMAL HIGH (ref 11.5–14.5)
WBC: 6.7 10*3/uL (ref 3.6–11.0)

## 2014-09-30 LAB — PHENYTOIN LEVEL, TOTAL: Dilantin: <2.5 ug/mL

## 2014-09-30 LAB — TROPONIN I

## 2014-10-01 DIAGNOSIS — E876 Hypokalemia: Secondary | ICD-10-CM | POA: Diagnosis not present

## 2014-10-01 DIAGNOSIS — E119 Type 2 diabetes mellitus without complications: Secondary | ICD-10-CM | POA: Diagnosis not present

## 2014-10-01 DIAGNOSIS — I10 Essential (primary) hypertension: Secondary | ICD-10-CM | POA: Diagnosis not present

## 2014-10-01 DIAGNOSIS — J441 Chronic obstructive pulmonary disease with (acute) exacerbation: Secondary | ICD-10-CM | POA: Diagnosis not present

## 2014-10-01 LAB — CBC WITH DIFFERENTIAL/PLATELET
Basophil #: 0 10*3/uL (ref 0.0–0.1)
Basophil %: 0 %
Eosinophil #: 0 10*3/uL (ref 0.0–0.7)
Eosinophil %: 0 %
HCT: 32.9 % — ABNORMAL LOW (ref 35.0–47.0)
HGB: 10.6 g/dL — ABNORMAL LOW (ref 12.0–16.0)
Lymphocyte #: 1.1 10*3/uL (ref 1.0–3.6)
Lymphocyte %: 17.9 %
MCH: 26.6 pg (ref 26.0–34.0)
MCHC: 32.2 g/dL (ref 32.0–36.0)
MCV: 83 fL (ref 80–100)
MONOS PCT: 7.4 %
Monocyte #: 0.4 x10 3/mm (ref 0.2–0.9)
NEUTROS PCT: 74.7 %
Neutrophil #: 4.5 10*3/uL (ref 1.4–6.5)
PLATELETS: 116 10*3/uL — AB (ref 150–440)
RBC: 3.99 10*6/uL (ref 3.80–5.20)
RDW: 18 % — AB (ref 11.5–14.5)
WBC: 6.1 10*3/uL (ref 3.6–11.0)

## 2014-10-01 LAB — COMPREHENSIVE METABOLIC PANEL
ALBUMIN: 3.4 g/dL — AB
ALT: 16 U/L
Alkaline Phosphatase: 108 U/L
Anion Gap: 10 (ref 7–16)
BILIRUBIN TOTAL: 0.8 mg/dL
BUN: 9 mg/dL
Calcium, Total: 9.2 mg/dL
Chloride: 106 mmol/L
Co2: 23 mmol/L
Creatinine: 0.92 mg/dL
EGFR (African American): >60
GLUCOSE: 220 mg/dL — AB
POTASSIUM: 3.2 mmol/L — AB
SGOT(AST): 32 U/L
Sodium: 139 mmol/L
TOTAL PROTEIN: 8.2 g/dL — AB

## 2014-10-02 DIAGNOSIS — I10 Essential (primary) hypertension: Secondary | ICD-10-CM | POA: Diagnosis not present

## 2014-10-02 DIAGNOSIS — E876 Hypokalemia: Secondary | ICD-10-CM | POA: Diagnosis not present

## 2014-10-02 DIAGNOSIS — J441 Chronic obstructive pulmonary disease with (acute) exacerbation: Secondary | ICD-10-CM | POA: Diagnosis not present

## 2014-10-02 DIAGNOSIS — E119 Type 2 diabetes mellitus without complications: Secondary | ICD-10-CM | POA: Diagnosis not present

## 2014-10-02 LAB — BASIC METABOLIC PANEL
ANION GAP: 7 (ref 7–16)
BUN: 17 mg/dL
CO2: 24 mmol/L
Calcium, Total: 9.3 mg/dL
Chloride: 106 mmol/L
Creatinine: 0.78 mg/dL
EGFR (Non-African Amer.): >60
Glucose: 103 mg/dL — ABNORMAL HIGH
POTASSIUM: 4.2 mmol/L
Sodium: 137 mmol/L

## 2014-10-03 DIAGNOSIS — J441 Chronic obstructive pulmonary disease with (acute) exacerbation: Secondary | ICD-10-CM | POA: Diagnosis not present

## 2014-10-03 DIAGNOSIS — E876 Hypokalemia: Secondary | ICD-10-CM | POA: Diagnosis not present

## 2014-10-03 DIAGNOSIS — I10 Essential (primary) hypertension: Secondary | ICD-10-CM | POA: Diagnosis not present

## 2014-10-03 DIAGNOSIS — E119 Type 2 diabetes mellitus without complications: Secondary | ICD-10-CM | POA: Diagnosis not present

## 2014-10-04 DIAGNOSIS — I10 Essential (primary) hypertension: Secondary | ICD-10-CM | POA: Diagnosis not present

## 2014-10-05 DIAGNOSIS — I10 Essential (primary) hypertension: Secondary | ICD-10-CM | POA: Diagnosis not present

## 2014-10-06 DIAGNOSIS — I10 Essential (primary) hypertension: Secondary | ICD-10-CM | POA: Diagnosis not present

## 2014-10-07 DIAGNOSIS — I10 Essential (primary) hypertension: Secondary | ICD-10-CM | POA: Diagnosis not present

## 2014-10-08 DIAGNOSIS — I10 Essential (primary) hypertension: Secondary | ICD-10-CM | POA: Diagnosis not present

## 2014-10-09 DIAGNOSIS — I10 Essential (primary) hypertension: Secondary | ICD-10-CM | POA: Diagnosis not present

## 2014-10-10 DIAGNOSIS — I10 Essential (primary) hypertension: Secondary | ICD-10-CM | POA: Diagnosis not present

## 2014-10-11 DIAGNOSIS — I10 Essential (primary) hypertension: Secondary | ICD-10-CM | POA: Diagnosis not present

## 2014-10-12 ENCOUNTER — Encounter: Payer: Self-pay | Admitting: *Deleted

## 2014-10-12 DIAGNOSIS — R6882 Decreased libido: Secondary | ICD-10-CM | POA: Diagnosis not present

## 2014-10-12 DIAGNOSIS — I10 Essential (primary) hypertension: Secondary | ICD-10-CM | POA: Diagnosis not present

## 2014-10-12 DIAGNOSIS — L298 Other pruritus: Secondary | ICD-10-CM | POA: Diagnosis not present

## 2014-10-13 DIAGNOSIS — I10 Essential (primary) hypertension: Secondary | ICD-10-CM | POA: Diagnosis not present

## 2014-10-14 DIAGNOSIS — I10 Essential (primary) hypertension: Secondary | ICD-10-CM | POA: Diagnosis not present

## 2014-10-15 DIAGNOSIS — I10 Essential (primary) hypertension: Secondary | ICD-10-CM | POA: Diagnosis not present

## 2014-10-16 ENCOUNTER — Emergency Department
Admit: 2014-10-16 | Disposition: A | Discharge status: Home / Self Care | Payer: Self-pay | Admitting: Emergency Medicine

## 2014-10-16 DIAGNOSIS — Z880 Allergy status to penicillin: Secondary | ICD-10-CM | POA: Diagnosis not present

## 2014-10-16 DIAGNOSIS — R5381 Other malaise: Secondary | ICD-10-CM | POA: Diagnosis not present

## 2014-10-16 DIAGNOSIS — R112 Nausea with vomiting, unspecified: Secondary | ICD-10-CM | POA: Diagnosis not present

## 2014-10-16 DIAGNOSIS — Z79899 Other long term (current) drug therapy: Secondary | ICD-10-CM | POA: Diagnosis not present

## 2014-10-16 DIAGNOSIS — R531 Weakness: Secondary | ICD-10-CM | POA: Diagnosis not present

## 2014-10-16 DIAGNOSIS — Z720 Tobacco use: Secondary | ICD-10-CM | POA: Diagnosis not present

## 2014-10-16 DIAGNOSIS — R197 Diarrhea, unspecified: Secondary | ICD-10-CM | POA: Diagnosis not present

## 2014-10-16 DIAGNOSIS — Z7952 Long term (current) use of systemic steroids: Secondary | ICD-10-CM | POA: Diagnosis not present

## 2014-10-16 DIAGNOSIS — E119 Type 2 diabetes mellitus without complications: Secondary | ICD-10-CM | POA: Diagnosis not present

## 2014-10-16 DIAGNOSIS — R05 Cough: Secondary | ICD-10-CM | POA: Diagnosis not present

## 2014-10-16 DIAGNOSIS — I10 Essential (primary) hypertension: Secondary | ICD-10-CM | POA: Diagnosis not present

## 2014-10-16 DIAGNOSIS — Z79891 Long term (current) use of opiate analgesic: Secondary | ICD-10-CM | POA: Diagnosis not present

## 2014-10-16 DIAGNOSIS — R9431 Abnormal electrocardiogram [ECG] [EKG]: Secondary | ICD-10-CM | POA: Diagnosis not present

## 2014-10-16 DIAGNOSIS — R1110 Vomiting, unspecified: Secondary | ICD-10-CM | POA: Diagnosis not present

## 2014-10-16 LAB — COMPREHENSIVE METABOLIC PANEL
ALBUMIN: 3.3 g/dL — AB
ALK PHOS: 107 U/L
ALT: 15 U/L
Anion Gap: 6 — ABNORMAL LOW (ref 7–16)
BILIRUBIN TOTAL: 1 mg/dL
BUN: 6 mg/dL
CHLORIDE: 107 mmol/L
Calcium, Total: 8.7 mg/dL — ABNORMAL LOW
Co2: 25 mmol/L
Creatinine: 0.83 mg/dL
EGFR (African American): >60
GLUCOSE: 117 mg/dL — AB
Potassium: 2.9 mmol/L — ABNORMAL LOW
SGOT(AST): 25 U/L
SODIUM: 138 mmol/L
TOTAL PROTEIN: 7.8 g/dL

## 2014-10-16 LAB — URINALYSIS, COMPLETE
BACTERIA: NONE SEEN
Bilirubin,UR: NEGATIVE
Glucose,UR: NEGATIVE mg/dL (ref 0–75)
KETONE: NEGATIVE
Leukocyte Esterase: NEGATIVE
Nitrite: NEGATIVE
PROTEIN: NEGATIVE
Ph: 6 (ref 4.5–8.0)
Specific Gravity: 1.006 (ref 1.003–1.030)

## 2014-10-16 LAB — CBC
HCT: 33.2 % — AB (ref 35.0–47.0)
HGB: 10.4 g/dL — ABNORMAL LOW (ref 12.0–16.0)
MCH: 25.8 pg — AB (ref 26.0–34.0)
MCHC: 31.4 g/dL — AB (ref 32.0–36.0)
MCV: 82 fL (ref 80–100)
PLATELETS: 135 10*3/uL — AB (ref 150–440)
RBC: 4.04 10*6/uL (ref 3.80–5.20)
RDW: 17.2 % — AB (ref 11.5–14.5)
WBC: 6.2 10*3/uL (ref 3.6–11.0)

## 2014-10-17 DIAGNOSIS — I10 Essential (primary) hypertension: Secondary | ICD-10-CM | POA: Diagnosis not present

## 2014-10-18 DIAGNOSIS — I10 Essential (primary) hypertension: Secondary | ICD-10-CM | POA: Diagnosis not present

## 2014-10-19 DIAGNOSIS — I10 Essential (primary) hypertension: Secondary | ICD-10-CM | POA: Diagnosis not present

## 2014-10-20 DIAGNOSIS — I10 Essential (primary) hypertension: Secondary | ICD-10-CM | POA: Diagnosis not present

## 2014-10-21 DIAGNOSIS — I10 Essential (primary) hypertension: Secondary | ICD-10-CM | POA: Diagnosis not present

## 2014-10-22 DIAGNOSIS — I10 Essential (primary) hypertension: Secondary | ICD-10-CM | POA: Diagnosis not present

## 2014-10-23 DIAGNOSIS — I10 Essential (primary) hypertension: Secondary | ICD-10-CM | POA: Diagnosis not present

## 2014-10-23 IMAGING — US US EXTREM LOW VENOUS*R*
1 series · 14 of 24 positions shown · non-contrast
Comparison: none

REASON FOR EXAM: right LE swelling and pain eval
COMMENTS:

[Series 1: us extrem low venous*right* · 0.11mm/px · 14 of 25 slices shown]
[im 1/25]
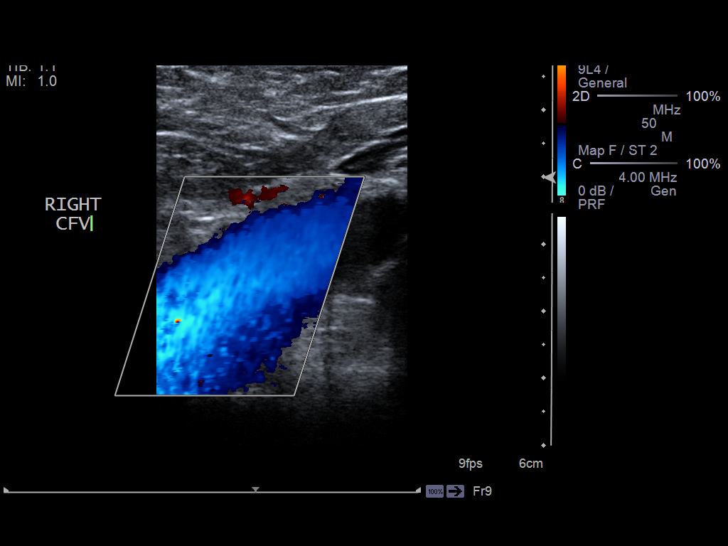
[im 3/25]
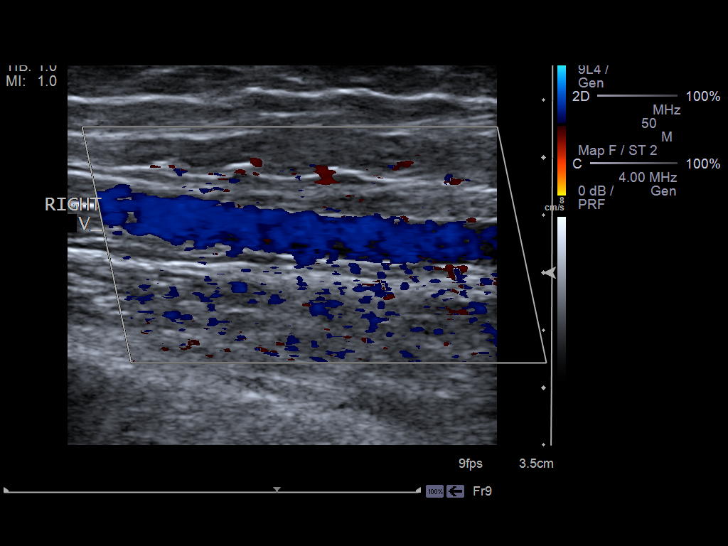
[im 5/25]
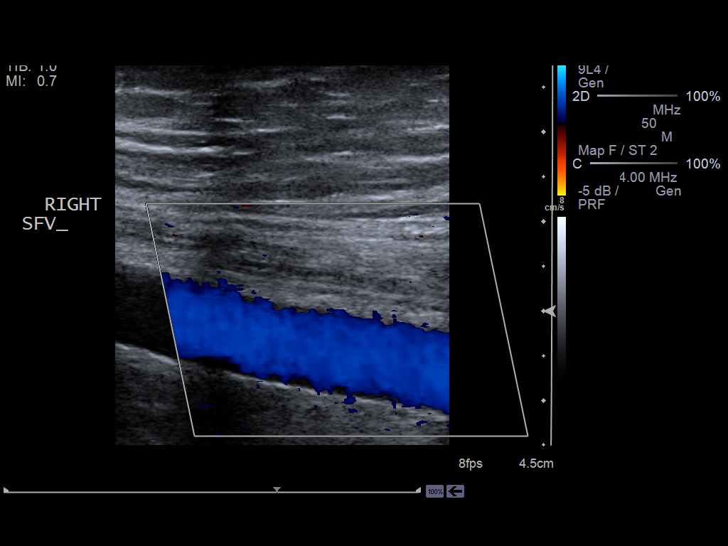
[im 7/25]
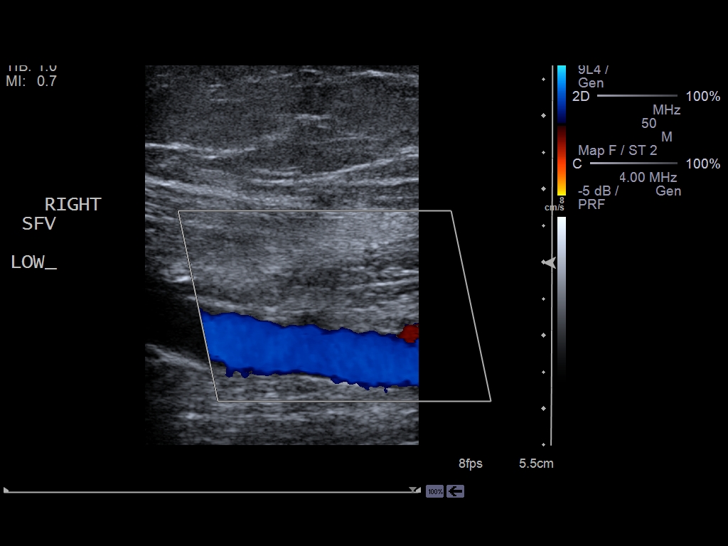
[im 8/25]
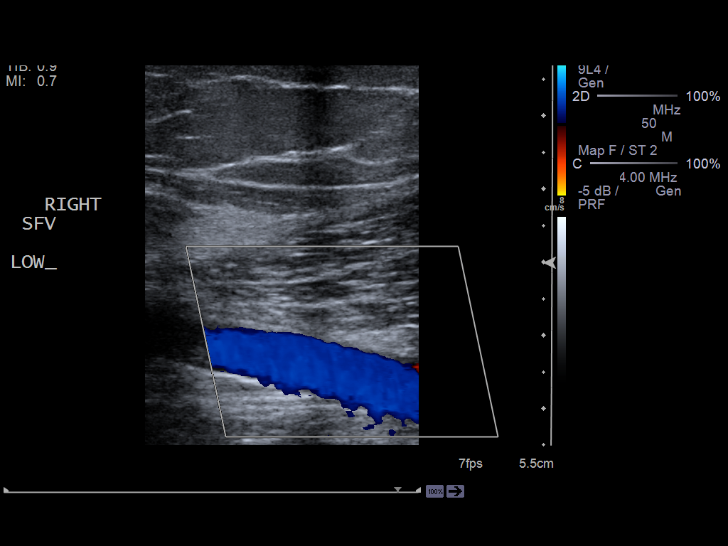
[im 10/25]
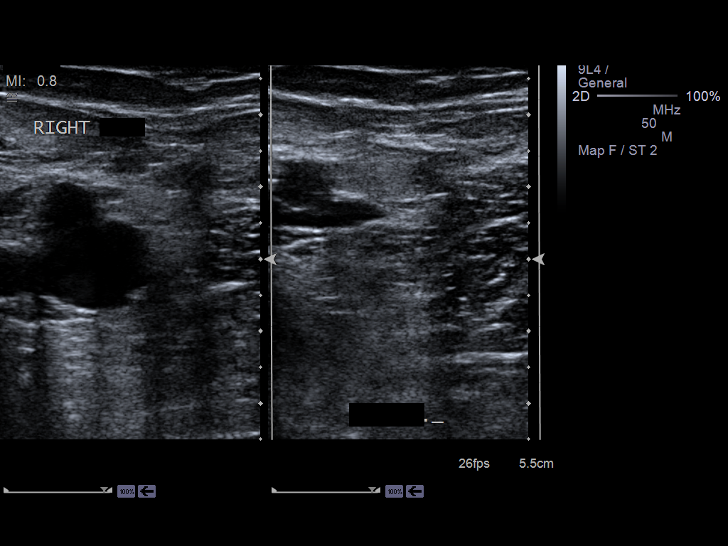
[im 12/25]
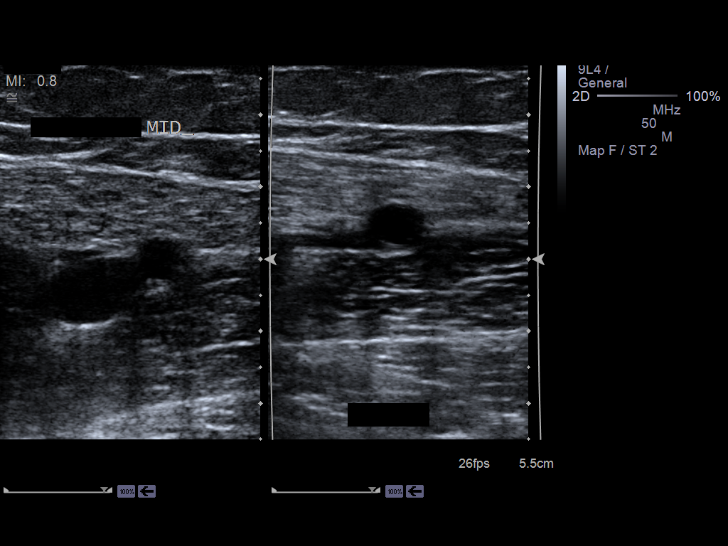
[im 13/25]
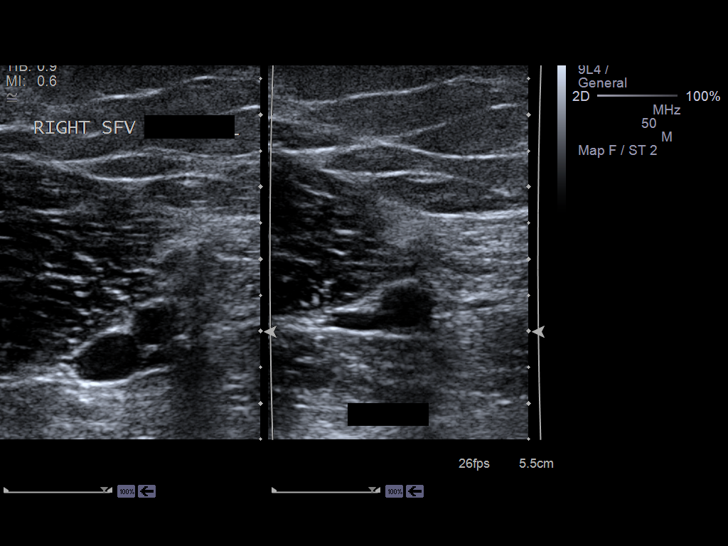
[im 15/25]
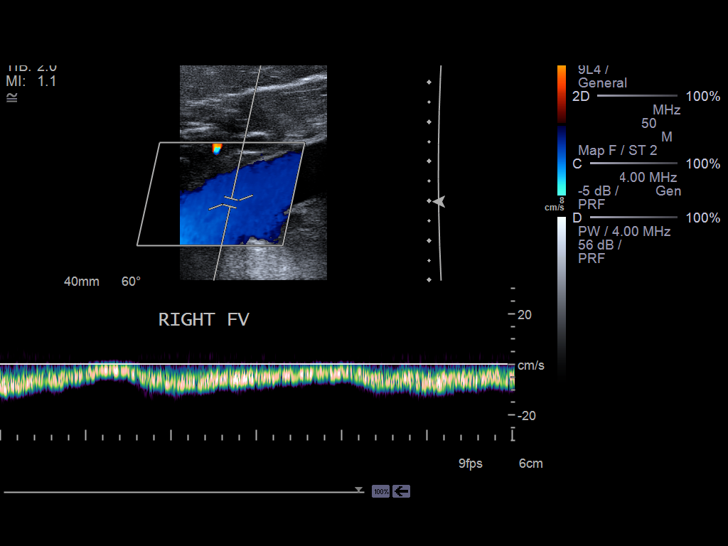
[im 17/25]
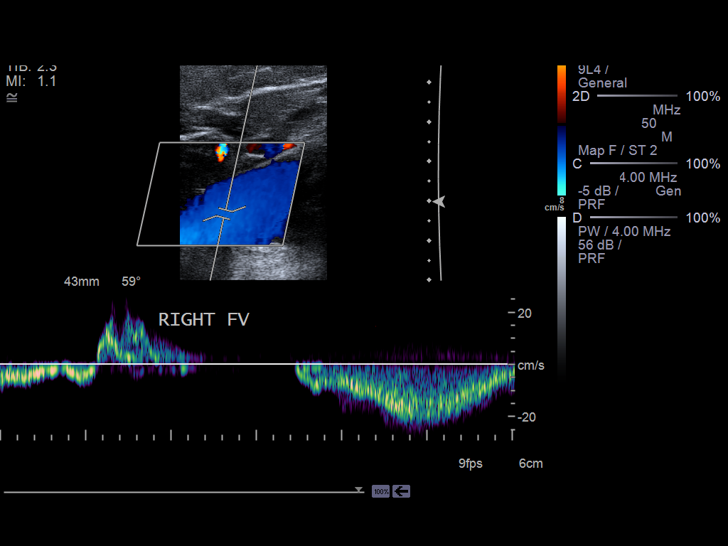
[im 19/25]
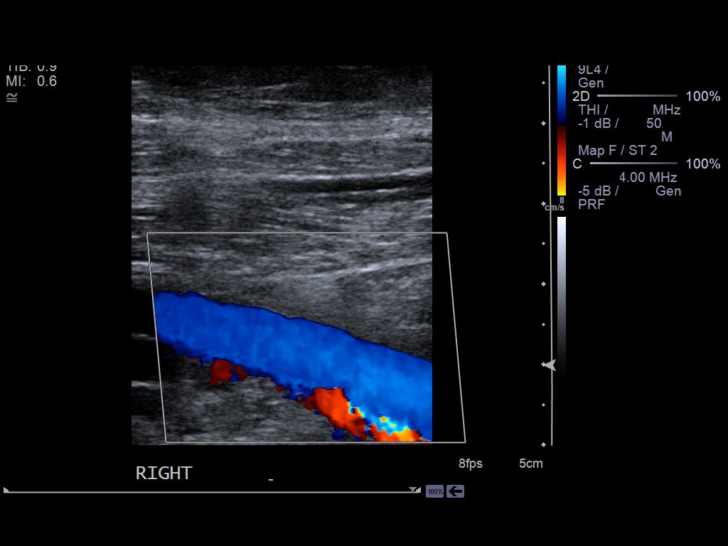
[im 20/25]
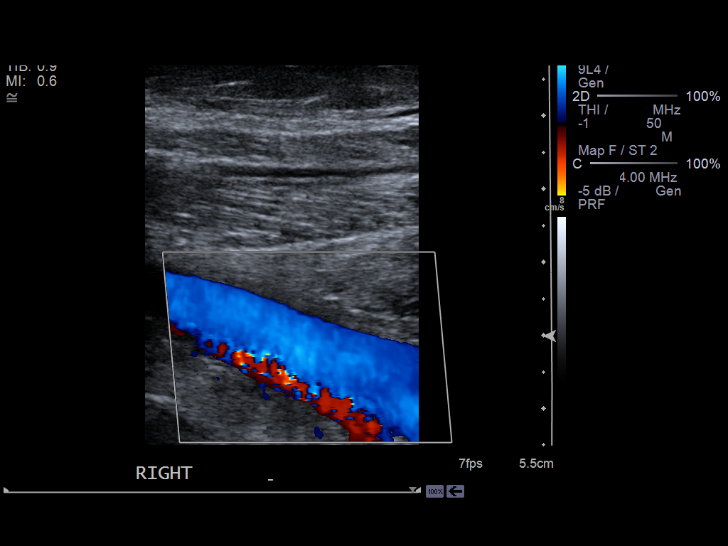
[im 22/25]
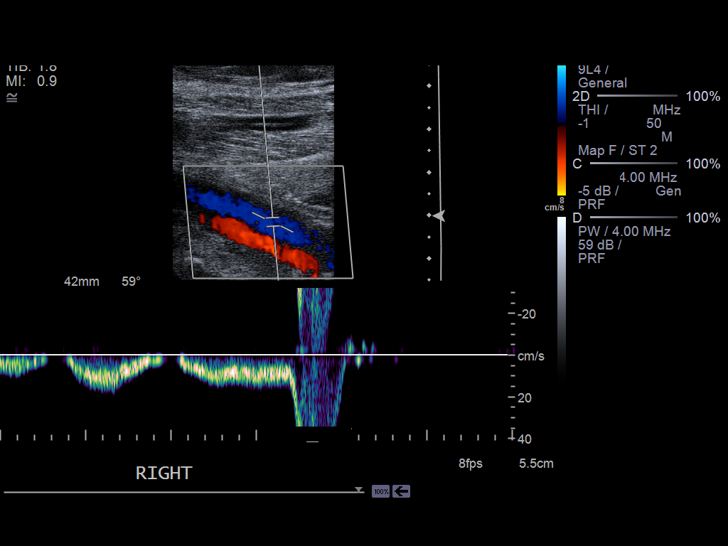
[im 25/25]
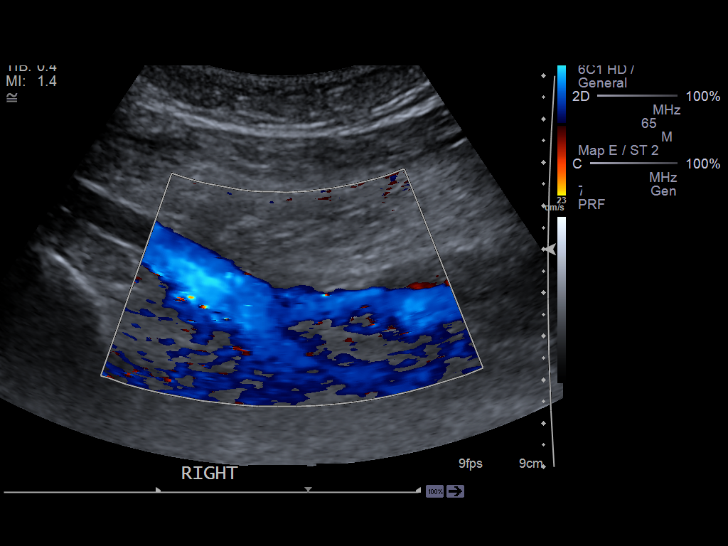

[14 of 24 positions shown; findings below may reference images not displayed]

PROCEDURE:     US  - US DOPPLER LOW EXTR RIGHT  - November 12, 2012  [DATE]

RESULT:     Comparison: None

Technique and findings: Multiple longitudinal and transverse grayscale as
well as color and spectral Doppler images of the right lower extremity veins
were obtained from the common femoral veins through the popliteal veins.

The right common femoral, femoral, and popliteal veins are patent,
demonstrating normal color-flow and compressibility. No intraluminal
thrombus is identified.  There is normal respiratory variation and
augmentation demonstrated at all vein levels.
IMPRESSION: No evidence of DVT in the right lower extremity.

## 2014-10-23 IMAGING — CR DG CHEST 2V
1 series · 2 of 2 positions shown · non-contrast
Comparison: none

REASON FOR EXAM: cough and leg swelling eval
COMMENTS:

[Series 1: w chest pa · 0.14mm/px · 2 of 2 slices shown]
[im 1/2]
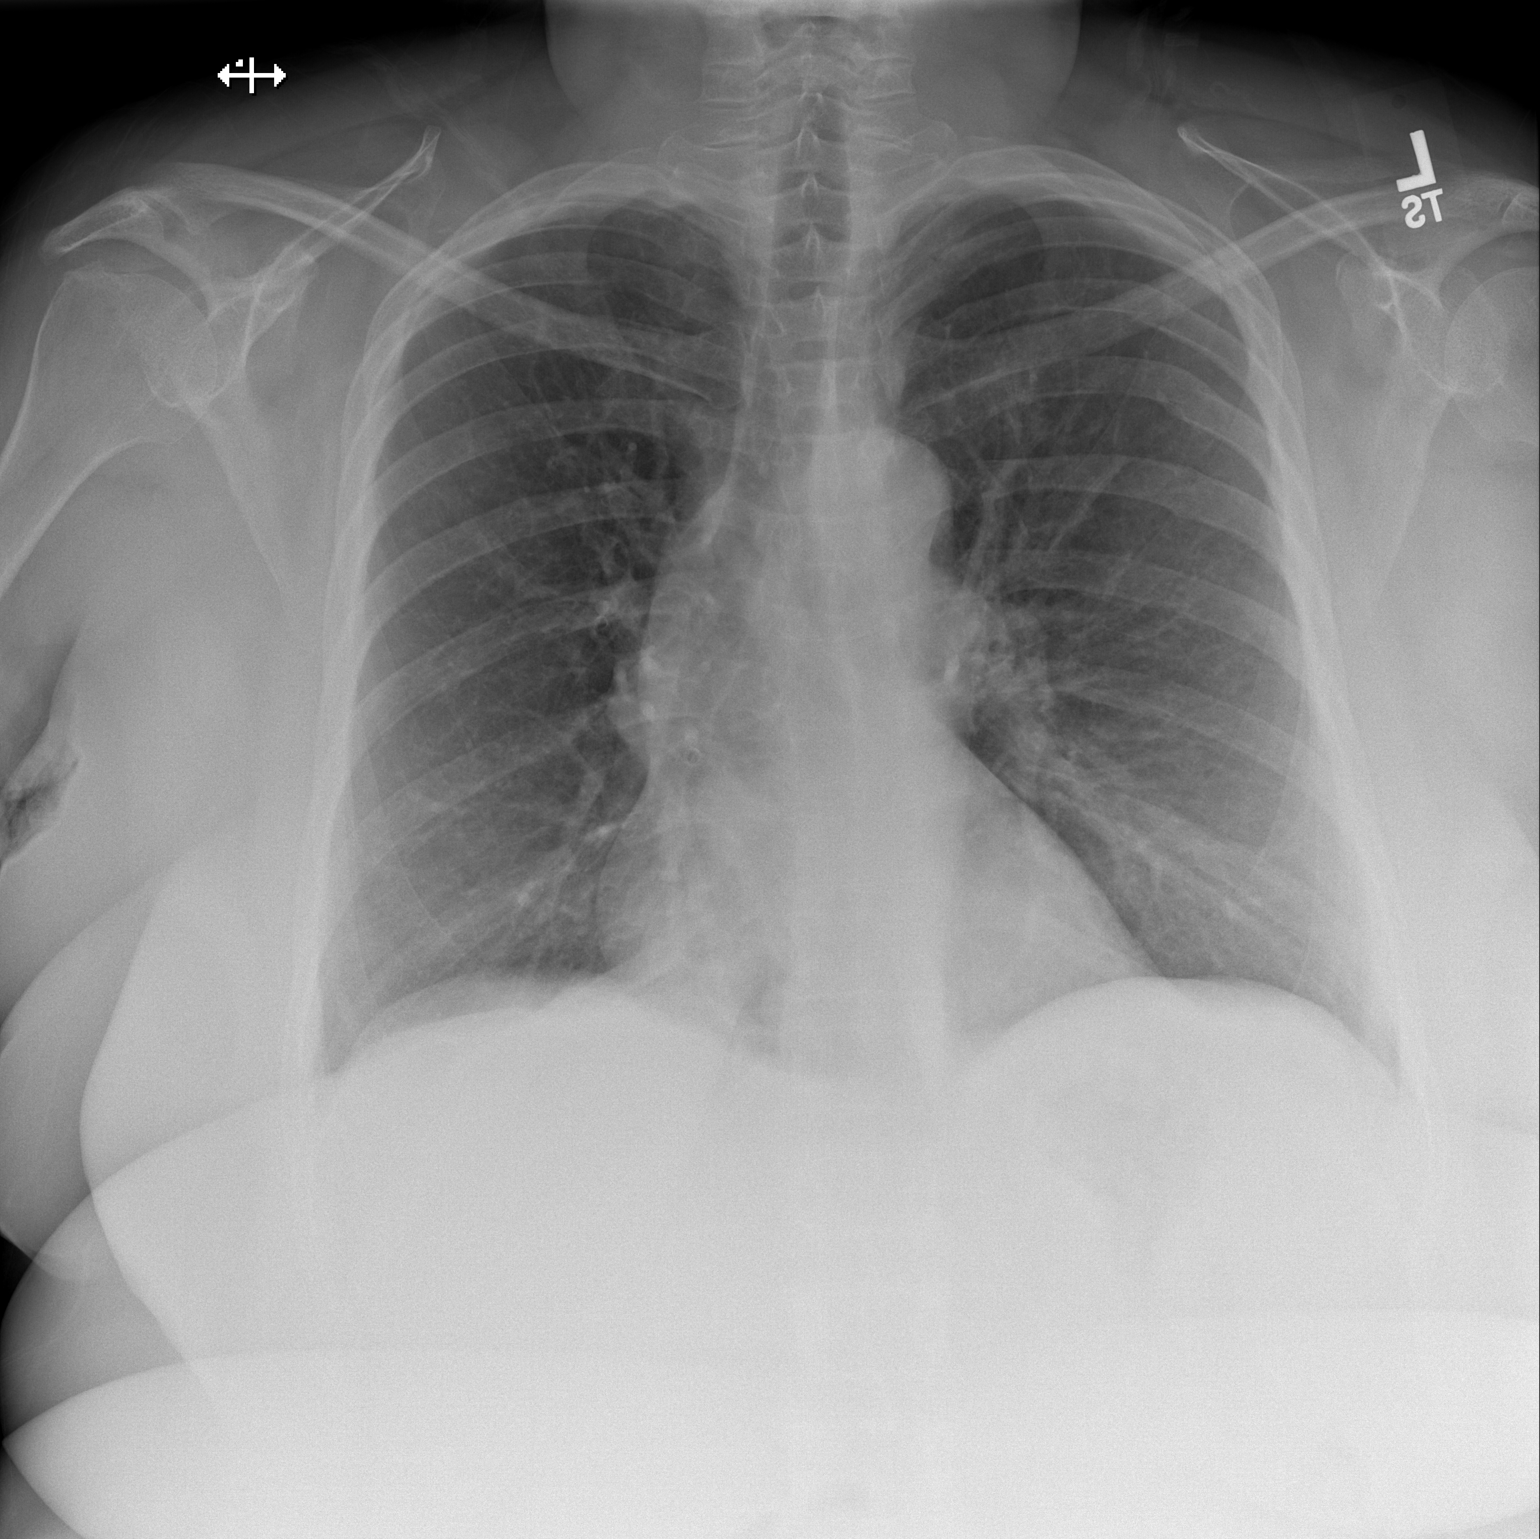
[im 2/2]
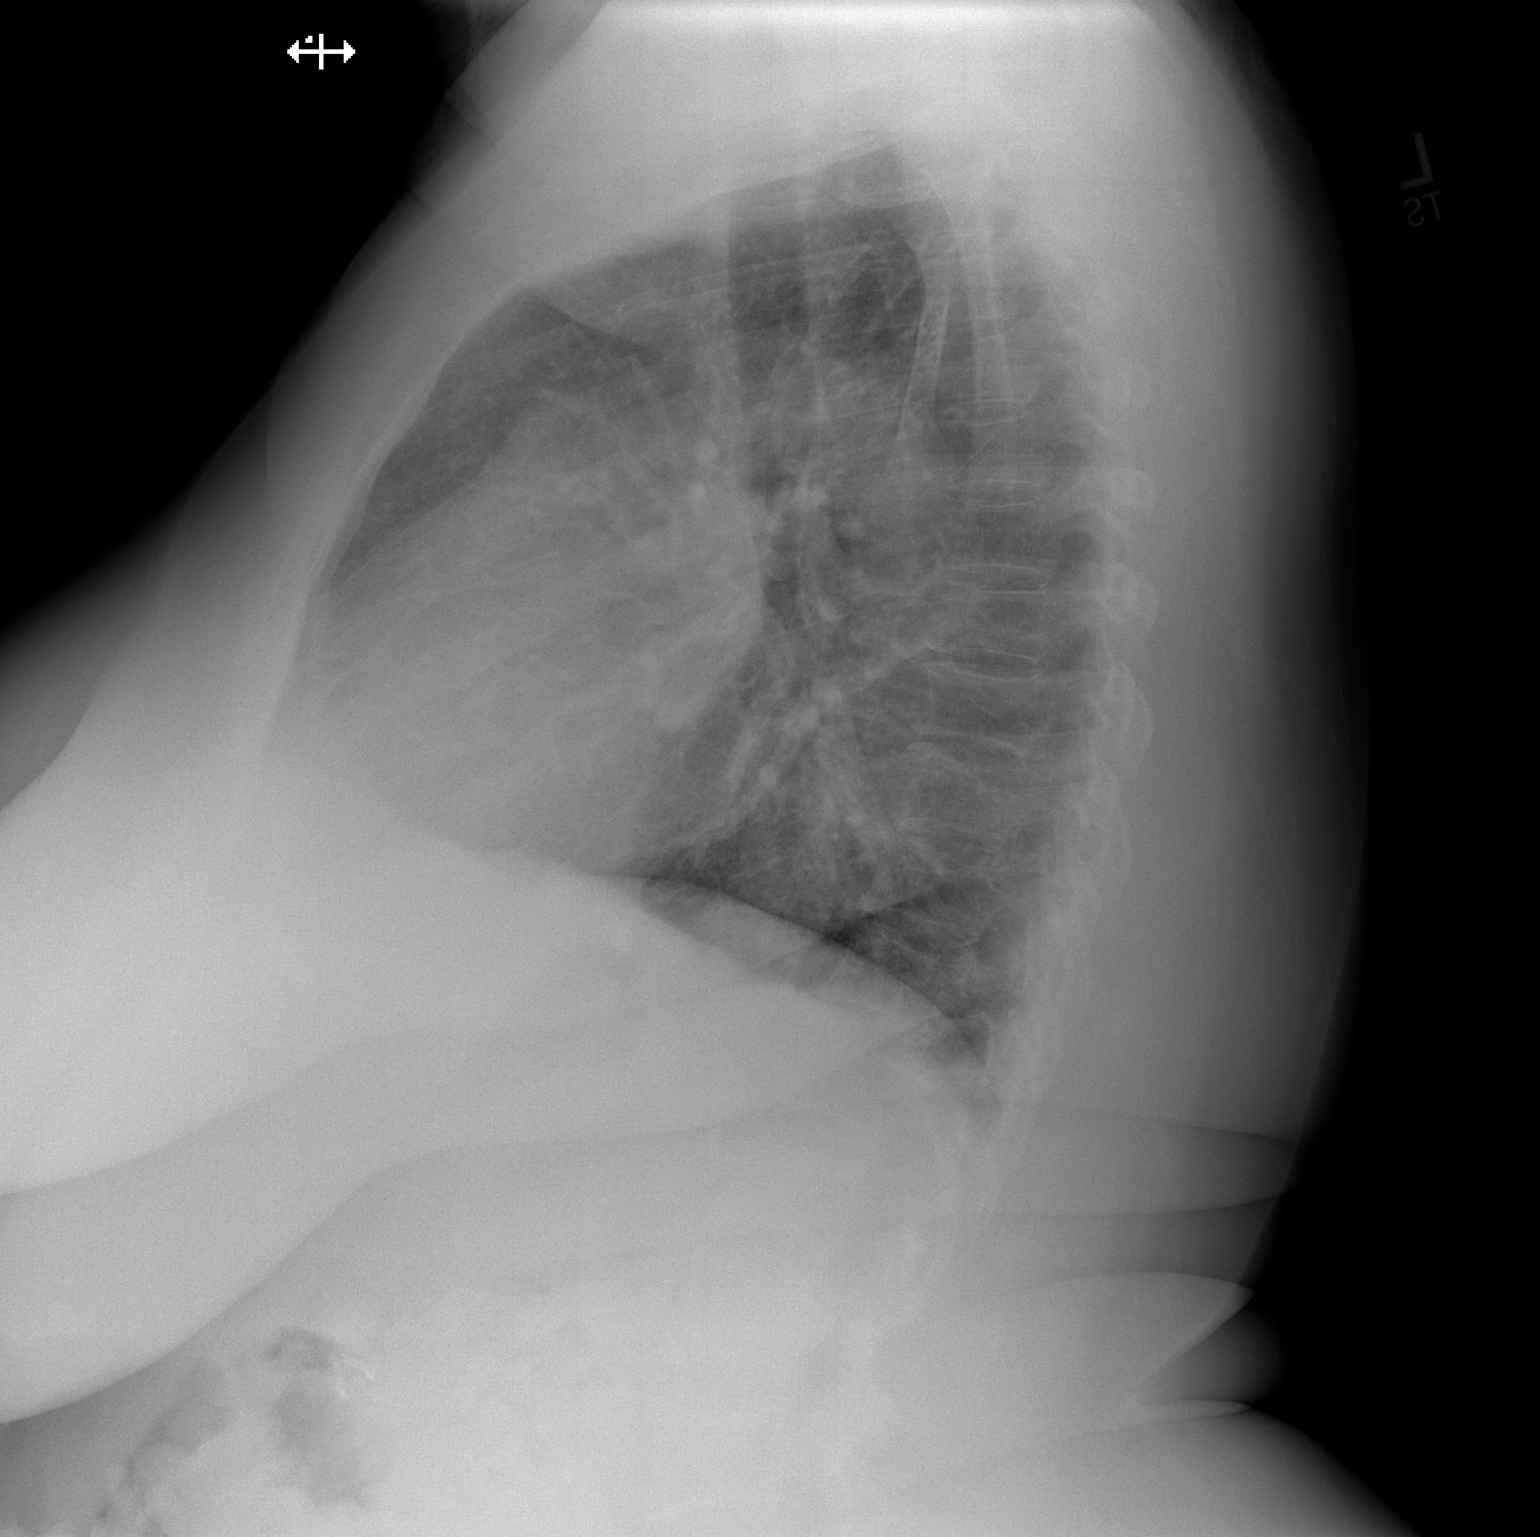

[2 of 2 positions shown; findings below may reference images not displayed]

PROCEDURE:     DXR - DXR CHEST PA (OR AP) AND LATERAL  - November 12, 2012  [DATE]

RESULT:     Comparison is made to the previous exam dated 21 September, 2012. The
lungs are clear. The heart and pulmonary vessels are normal. The bony and
mediastinal structures are unremarkable. There is no effusion. There is no
pneumothorax or evidence of congestive failure.
IMPRESSION: No acute cardiopulmonary disease.

[REDACTED]

## 2014-10-24 ENCOUNTER — Encounter: Payer: Self-pay | Admitting: General Surgery

## 2014-10-24 ENCOUNTER — Ambulatory Visit (INDEPENDENT_AMBULATORY_CARE_PROVIDER_SITE_OTHER): Payer: Medicare Other | Admitting: General Surgery

## 2014-10-24 ENCOUNTER — Other Ambulatory Visit: Payer: Medicare Other

## 2014-10-24 ENCOUNTER — Ambulatory Visit: Payer: Medicare Other

## 2014-10-24 VITALS — BP 132/72 | HR 68 | Resp 12 | Ht 68.0 in | Wt 260.0 lb

## 2014-10-24 DIAGNOSIS — N823 Fistula of vagina to large intestine: Secondary | ICD-10-CM

## 2014-10-24 DIAGNOSIS — I10 Essential (primary) hypertension: Secondary | ICD-10-CM | POA: Diagnosis not present

## 2014-10-24 NOTE — Progress Notes (Signed)
Patient ID: [name], female   DOB: November 03, 1955, 60 y.o.   MRN: [id number]  Chief Complaint  Patient presents with  . Other    Fistula    HPI [name] is a 59 y.o. female.  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 Patient reports been going on four months. Patient states in a lot of pain, and bleeding (dark). Passing stool out of vagina and rectum. Pt had this problem in 2011 and had repair including colostomy and subsequent reversal. Says she was well till few months ago when problem recurred. Pt does not want to see her prior surgeon.    HPI  Past Medical History  Diagnosis Date  . Emphysema of lung   . Depression   . Diverticulitis   . Chronic bronchitis   . Malignant brain tumor 2007  . GERD (gastroesophageal reflux disease)   . Allergy   . Hepatitis C   . Hypertension   . Chronic kidney disease   . Migraines   . Urine incontinence   . Seizures   . Stroke 2007    during brain surgery  . Pancreatitis, alcoholic 3220  . Rectovaginal fistula   . H/O ETOH abuse     Sober since 2009  . H/O drug abuse     Clean since 2009  . Pancreatic ascites   . Cirrhosis     Past Surgical History  Procedure Laterality Date  . Appendectomy  1999  . Eye surgery    . Rectovaginal fistula repair w/ colostomy  2011  . Brain surgery  2007    malignant brain tumor  . Cholecystectomy  2001  . Abdominal hysterectomy  1979    Family History  Problem Relation Age of Onset  . Hypertension Mother   . Heart disease Father   . Diabetes Father   . Cancer Sister     brain  . Cancer Grandchild 8    brain tumor    Social History History  Substance Use Topics  . Smoking status: Current Some Day Smoker -- 0.25 packs/day    Types: Cigarettes  . Smokeless tobacco: Never Used     Comment: cutting back  . Alcohol Use: No     Comment: no alcohol since 2013    Allergies  Allergen Reactions  . Dilaudid [Hydromorphone Hcl] Itching  . Hydromorphone Itching   . Sulfa Antibiotics Hives  . Zofran [Ondansetron Hcl] Itching  . Amoxicillin Rash  . Chocolate Rash  . Penicillins Rash  . Strawberry Rash    Current Outpatient Prescriptions  Medication Sig Dispense Refill  . albuterol (PROAIR HFA) 108 (90 BASE) MCG/ACT inhaler Inhale 2 puffs into the lungs every 6 (six) hours as needed for wheezing or shortness of breath.    Marland Kitchen albuterol (PROVENTIL) (5 MG/ML) 0.5% nebulizer solution Take 2.5 mg by nebulization 4 (four) times daily.    Marland Kitchen ALPRAZolam (XANAX) 1 MG tablet Take 1 tablet (1 mg total) by mouth daily. 30 tablet 0  . amitriptyline (ELAVIL) 50 MG tablet Take 1 tablet (50 mg total) by mouth at bedtime. 30 tablet 3  . amLODipine (NORVASC) 10 MG tablet Take 1 tablet (10 mg total) by mouth daily. 30 tablet 4  . CREON 12000 UNITS CPEP Take 2 capsules by mouth 3 (three) times daily before meals.     . docusate sodium (STOOL SOFTENER) 100 MG capsule Take 100 mg by mouth.    . escitalopram (LEXAPRO) 10 MG tablet Take 1 tablet (10 mg  total) by mouth daily. 30 tablet 4  . fesoterodine (TOVIAZ) 8 MG TB24 tablet Take 8 mg by mouth.    . Fluticasone-Salmeterol (ADVAIR) 250-50 MCG/DOSE AEPB Inhale 1 puff into the lungs daily as needed (shortness of breath).     . gabapentin (NEURONTIN) 300 MG capsule Take 300 mg by mouth 2 (two) times daily as needed (pain).    . hydrOXYzine (ATARAX/VISTARIL) 25 MG tablet Take 25 mg by mouth.    . lactulose (CHRONULAC) 10 GM/15ML solution Take 20 g by mouth daily as needed for mild constipation.     . Ledipasvir-Sofosbuvir (HARVONI) 90-400 MG TABS Take 1 tablet by mouth daily. 28 tablet 2  . levETIRAcetam (KEPPRA) 500 MG tablet Take 1 tablet (500 mg total) by mouth 2 (two) times daily. 60 tablet 3  . metoprolol succinate (TOPROL-XL) 25 MG 24 hr tablet Take 1 tablet (25 mg total) by mouth daily. 30 tablet 4  . Misc Natural Products (HORNY GOAT WEED) CAPS Take 1 capsule by mouth.    Marland Kitchen omeprazole (PRILOSEC) 20 MG capsule Take 1  capsule (20 mg total) by mouth daily. 30 capsule 11  . promethazine (PHENERGAN) 25 MG tablet Take 25 mg by mouth every 6 (six) hours as needed for nausea or vomiting.     Marland Kitchen QUEtiapine (SEROQUEL) 100 MG tablet Take 3.5 tablets (350 mg total) by mouth daily. 105 tablet 4  . ranitidine (ZANTAC) 150 MG capsule Take 150 mg by mouth once.    . solifenacin (VESICARE) 10 MG tablet Take 10 mg by mouth daily.    . temazepam (RESTORIL) 30 MG capsule Take 30 mg by mouth at bedtime.    Marland Kitchen tiotropium (SPIRIVA) 18 MCG inhalation capsule Place 18 mcg into inhaler and inhale 2 (two) times daily as needed (shortness of breath).     . traMADol (ULTRAM) 50 MG tablet Take 50 mg by mouth every 6 (six) hours as needed.    . triamcinolone (NASACORT ALLERGY 24HR) 55 MCG/ACT AERO nasal inhaler Place 2 sprays into the nose daily as needed (allergies).    . zolpidem (AMBIEN) 10 MG tablet Take 1 tablet (10 mg total) by mouth at bedtime. 30 tablet 3   No current facility-administered medications for this visit.    Review of Systems Review of Systems  Constitutional: Positive for fever and chills. Negative for diaphoresis, activity change, appetite change, fatigue and unexpected weight change.  Respiratory: Negative.   Cardiovascular: Positive for leg swelling.  Gastrointestinal: Positive for anal bleeding.    Blood pressure 132/72, pulse 68, resp. rate 12, height 5\' 8"  (1.727 m), weight 260 lb (117.935 kg).  Physical Exam Physical Exam  Constitutional:  Overweight,  alert, oriented  Eyes: Conjunctivae are normal. No scleral icterus.  Cardiovascular: Normal rate, regular rhythm and normal heart sounds.   Pulmonary/Chest: Effort normal and breath sounds normal.  Abdominal: Soft. Bowel sounds are normal. There is tenderness (mild nonfocal, lower abd.).    Obese abdomen.  Genitourinary: Rectal exam shows no external hemorrhoid, no fissure and no tenderness.  Lymphadenopathy:    She has no cervical adenopathy.     Data Reviewed Recent CT abdomen  Assessment    Pt has had prior surgery for the colorectal fistula. With recurrence of similar problem surgical management is likely more complicated. Pt also has multiple other issues and her obesity is a huge risk factor by itself. Feel she would be best served in a tertiary care facility. I have discussed all of this in full  with the pt.      Plan   Referral to tertiary care per Dr. Brunetta Genera- pt has been seen In [facility] before.   PCP: Dr. Kathryne Eriksson 10/24/2014, 12:41 PM

## 2014-10-25 DIAGNOSIS — I10 Essential (primary) hypertension: Secondary | ICD-10-CM | POA: Diagnosis not present

## 2014-10-25 DIAGNOSIS — Z1329 Encounter for screening for other suspected endocrine disorder: Secondary | ICD-10-CM | POA: Diagnosis not present

## 2014-10-25 NOTE — Consult Note (Signed)
Chief Complaint:   Subjective/Chief Complaint Feels better. Hungry. No nausea this AM. Lipase coming down.Still with melena.   VITAL SIGNS/ANCILLARY NOTES: **Vital Signs.:   22-Sep-13 05:14   Vital Signs Type Routine   Temperature Temperature (F) 98.1   Celsius 36.7   Temperature Source Oral   Pulse Pulse 98   Respirations Respirations 18   Systolic BP Systolic BP 245   Diastolic BP (mmHg) Diastolic BP (mmHg) 77   Mean BP 91   Pulse Ox % Pulse Ox % 90   Pulse Ox Activity Level  At rest   Oxygen Delivery Room Air/ 21 %   Brief Assessment:   Cardiac Regular    Respiratory clear BS    Gastrointestinal mild epig tenderness   Lab Results: Hepatic:  22-Sep-13 07:16    Bilirubin, Total 1.0   Bilirubin, Direct  0.6 (Result(s) reported on 29 Mar 2012 at 07:59AM.)   Alkaline Phosphatase  191   SGPT (ALT)  178   SGOT (AST)  214   Total Protein, Serum  8.7   Albumin, Serum 3.5  Routine Hem:  22-Sep-13 07:16    Hemoglobin (CBC) 12.4 (Result(s) reported on 29 Mar 2012 at 07:46AM.)   Assessment/Plan:  Assessment/Plan:   Assessment Pancreatitis. Improving. Melena. R/O UGI bleeding.    Plan Clear liquid diet but NPO after MN. EGD tomorrow. Thanks   Electronic Signatures: Verdie Shire (MD)  (Signed 22-Sep-13 10:12)  Authored: Chief Complaint, VITAL SIGNS/ANCILLARY NOTES, Brief Assessment, Lab Results, Assessment/Plan   Last Updated: 22-Sep-13 10:12 by Verdie Shire (MD)

## 2014-10-25 NOTE — Consult Note (Signed)
Brief Consult Note: Diagnosis: depression nos, alcohol abuse.   Patient was seen by consultant.   Consult note dictated.   Recommend further assessment or treatment.   Orders entered.   Comments: Psychiatry: Patient seen. Chart reviewed. Assessment done. Patient with chronic depression has been off meds and relapsed to drinking. Mood depressed but denies any acute suicidal wish or intent. Cooperative with treatment plan. No current signs of alcohol withdrawl.  Seroquel and amitriptaline, her usual meds, already restarted but I adjusted the dosage a little bit. Will restart xanax 1mg  bid which has been an outpt med and will also help as a another preventative of seizures. Patient needs referral to outpt psych provider at discharge. Will follow.  Electronic Signatures: Gonzella Lex (MD)  (Signed 15-Aug-13 12:46)  Authored: Brief Consult Note   Last Updated: 15-Aug-13 12:46 by Gonzella Lex (MD)

## 2014-10-25 NOTE — Consult Note (Signed)
PATIENT NAME:  [name], [name] MR#:  [id number] DATE OF BIRTH:  [birth year]  DATE OF CONSULTATION:  03/28/2012  REFERRING PHYSICIAN:   CONSULTING PHYSICIAN:  Lupita Dawn. Alajia Schmelzer, MD  REASON FOR CONSULTATION: Melena, nausea, vomiting, and diarrhea.  HISTORY OF PRESENT ILLNESS: The patient is a 59 year old black female with frequent hospitalization due to alcohol related problems. She supposedly has a history of chronic pancreatitis and liver cirrhosis, although the most recent CT does not show any evidence of this. She also has a history of hepatitis C and was told about that three years ago but was never treated. Recently she has been taking [place] for abdominal pain and drinking and smoking actively. She has not been taking Prilosec like she is supposed to. She comes in with worsening abdominal pain, mostly in the left upper quadrant area, associated with nausea, vomiting, and diarrhea. She also has some melena as well. On review she has had upper endoscopy in 2009 and 2011. There was evidence of esophagitis in 2011. Endoscopy done in 2009 showed evidence of gastritis and a possible duodenal mass, but there is no biopsy of this area. There was no evidence of either esophageal varices or portal gastropathy on either of the endoscopies.   The patient is rather sleepy and it was difficult to get an adequate history from the patient, but she has been taking Gabriel Earing Powder because she did not have enough OxyContin or oxycodone that she had taken in the past. Rectal examination in the emergency room was strongly heme positive and stool appeared to be black and tarry. She has not been seen by her primary recently.   REVIEW OF SYSTEMS: There are no fevers or chills or fatigue. There is no visual or hearing changes. There is no chest pain or palpitations. There is no coughing or shortness of breath. Again, GI showed left upper quadrant pain with nausea, vomiting, diarrhea, and melena. The rest of the review of systems  are negative.   PAST MEDICAL HISTORY:  1. Last admission was in August for similar symptoms. 2. History of alcoholic liver cirrhosis. Again, there is no documentation or recent CT scan.  3. Seizure history. 4. History of brain tumor astrocytoma requiring resection. 5. Hypertension. 6. Chronic obstructive pulmonary disease. 7. Osteoporosis.  8. History of schizophrenia. 9. History of stroke.   PAST SURGICAL HISTORY:  1. Cholecystectomy.  2. Appendectomy.  3. Craniotomy. 4. Colostomy and rectovaginal fistula.  FAMILY HISTORY: Notable for heart disease and hypertension.   SOCIAL HISTORY: She still smokes 1/2 pack a day and drinks alcohol, mainly beer. She is married.   ADMISSION MEDICATIONS: Aldactone, amlodipine, Toprol, omeprazole which she has not been taking, Dilantin for seizures, Keppra, Percocet, Elavil, Lexapro, Ambien, Creon, Spiriva, and inhalers.   ALLERGIES: Penicillin, sulfa, Dilaudid, and possible ibuprofen.   PHYSICAL EXAMINATION:   GENERAL: The patient is in no acute distress.   VITAL SIGNS: She is afebrile. Vital signs are stable. She is somewhat drowsy and somewhat lethargic.   HEENT: Normocephalic, atraumatic head. Pupils are equally reactive. Throat was clear.   NECK: Supple.   CARDIAC: Regular rate and rhythm.   PULMONARY: Lungs are clear bilaterally.   ABDOMEN: Soft. There is some tenderness mainly in the left upper quadrant area. There is no rebound or guarding. There are scars over the abdomen. There is no hepatomegaly.   EXTREMITIES: No clubbing, cyanosis, or edema.   NEUROLOGIC: Examination is nonfocal.   LABS/STUDIES: On admission labs include sodium of 135, potassium  3.8, chloride 101, CO2 20, BUN 13, creatinine 0.81, and glucose 104. Lipase 826. Bilirubin 1.0, alkaline phosphatase 232, AST 221, and ALT 168. White count 9.1 and hemoglobin 13.8. INR 1.2.   Urinalysis is negative.   IMPRESSION/RECOMMENDATIONS: This is a patient with evidence  of pancreatitis and gastrointestinal bleeding. I agree with making the patient n.p.o. and giving her IV fluids. We will need to check to make sure she has DVT prophylaxis. The patient will require pain medication and nausea medication. She will eventually need an upper endoscopy done to check for the site of bleeding, also check for any evidence of esophageal varices or portal gastropathy. I would like to check a viral hepatitis C RNA level and genotype to see how active the hepatitis C is. Since she is NPO, here is no need to give her Creon, but once she starts eating then we will put her back on Creon. Thank you for the referral. ____________________________ Lupita Dawn. Candace Cruise, MD pyo:slb D: 03/29/2012 09:02:00 ET T: 03/29/2012 13:21:12 ET JOB#: [id number]  cc: Lupita Dawn. Candace Cruise, MD, <Dictator> Lupita Dawn Aden Sek MD ELECTRONICALLY SIGNED 03/30/2012 11:02

## 2014-10-25 NOTE — Consult Note (Signed)
Pt seen and examined. Full consult to follow. Frequent hospitalizations for alcohol related problems. Supposedly hx of chronic pancreatitis and liver cirrhosis though recent CT does not show this. PLTC and PT levels normal. Also, hx of hep C but never treated. Taking goody powder for pain. Still drinking and smoking actively. Has active pancreatitis now. Has melena. EGD's in 2009 and 2011. Had esophagitis, gastritis, and even duodenal mass though no bx of this. No evidence of either esophageal varices or portal gastropathy then. Agree with octreotide drip. Protonix bid. Moniter lipase and LFT. NPO for now. Plan EGD on Monday to evaluate UGI tract unless patient has active bleeding with drop in BP and hgb. Then will do EGD sooner. Will check hep C RNA/genotype levels.Will follow. Thanks.   Electronic Signatures: Verdie Shire (MD) (Signed on 21-Sep-13 10:42)  Authored   Last Updated: 21-Sep-13 10:42 by Verdie Shire (MD)

## 2014-10-25 NOTE — Consult Note (Signed)
PATIENT NAME:  [name], [name] MR#:  [id number] DATE OF BIRTH:  Mar 24, 1956  DATE OF CONSULTATION:  02/20/2012  REFERRING PHYSICIAN:   CONSULTING PHYSICIAN:  Gonzella Lex, MD  IDENTIFYING INFORMATION AND REASON FOR CONSULT:  This is a 59 year old woman admitted to the hospital for what appears to be acute pancreatitis. Consultation for evaluation of depression and appropriate psychiatric treatment.   HISTORY OF PRESENT ILLNESS: Information obtained from the patient and from the chart. The patient presented to the hospital with acute abdominal pain and labs, and exam are suggestive of pancreatitis which has been a recurrent problem for her in the past. The patient admits that she has been back to drinking regularly. She has been drinking at least a 12 pack or more of beer a day probably for as much as 2 weeks. Prior to that she had been staying sober for many months by her report. She denies that she has been abusing any other drugs. She says that she started back drinking because her father died recently. Additionally, she is reporting depressed mood with low energy, difficulty sleeping at night, hopelessness, lack of interest and motivation to participate in usual activities, lack of enjoyment, and some passive suicidal thoughts. Has not had auditory or visual hallucinations. Has not had clear psychotic symptoms. She says she has been out of her antidepressant medicines for probably 3 to 4 weeks. She says that she was dismissed from the last psychiatric clinic she was going to because of frequent missed appointments and has not been able to find another provider. She details a lot of stresses in her life, including the chronic stress of worrying about her 2 sons, who are imprisoned for long periods of time and whom she does not get to see as well as more acute stresses such as a fight she is having with her mother.   PAST PSYCHIATRIC HISTORY: Has had a long history of problems with mood and anxiety.  Various diagnoses but the overall pattern seems to probably be most typical of recurrent depression and dysthymia as well as a history of substance abuse. It is not clear that she really has chronic psychotic symptoms. She has had hospitalizations in the past but it has been many years. She has had suicide attempts by overdoses, but it has also been many years ago. Her primary medication that has been stable for a while as an outpatient was a combination of Seroquel and amitriptyline, sometimes augmented by benzodiazepines. There was a time in the past when she was being treated with stimulants, but it is not clear how much of a difference that really made.   SOCIAL HISTORY: The patient is not able to work. She currently is back living with her husband from whom she had been estranged for a while. Just the two of them live together. Money has been very short, and their furniture is getting repossessed so that she has to sleep on the floor. The patient has 4 children, and the 2 oldest boys are both serving life sentences in prison. She often has a close relationship with her mother but it is also a conflicted one, and recently they have been fighting over money and her feelings have been hurt.   PAST MEDICAL HISTORY: Patient has a history of recurrent episodes of pancreatitis. She also has a seizure disorder which is apparently secondary to a brain tumor she has had that was resected in the past. She has a history of chronic obstructive lung disease, chronic  liver impairment as well.   REVIEW OF SYSTEMS: Complains of depressed mood, lack of interest in normal activities, hopelessness, passive thoughts of wishing she was dead without any intention to harm herself. Denies homicidal ideation. Denies hallucinations. Denies delusions. Has acute abdominal pain and nausea.   MENTAL STATUS EXAM: The patient was interviewed in her hospital room. She was cooperative and appropriate. Psychomotor activity was limited by  her physical state. Eye contact was good. Speech was quiet but easy to understand, normal in volume. Affect was flat, some tearfulness, but not extreme. Mood stated as depressed. Thoughts are generally lucid but at times tangential. No evidence of delusions or bizarre thinking. Denies any auditory or visual hallucinations. Says she has had some suicidal thoughts of wishing she was dead at times but has no intention of acting on it. Still maintains some optimism. No homicidal ideation. Short and longer term memory grossly intact, normal intelligence. Alert and oriented x3.   RESULTS OF LAB STUDIES: Patient presented to the emergency room on 02/19/2012 and at that time had a low BUN, normal creatinine. ALT elevated to 88, AST elevated to 167, alkaline phosphatase elevated to 221. Total protein elevated at 9.4. Lipase was 685. Dilantin level was a little bit low at 9.1. Subsequent labs showed that the lipase has actually gone up since then. No alcohol level or drug screen was obtained.   VITAL SIGNS: Full physical exam not done for this consult. I do notice that her pulse has been staying under 100, and blood pressure has been staying in the normal range.   ASSESSMENT: A 59 year old woman with chronic mental illness, primarily depression as well as chronic alcohol abuse. Relapsed into alcohol use. Has been off her medicine. Symptoms of depression are worse. Not acutely suicidal. Does need to have resumption of treatment. I have concerns about the possibility of alcohol withdrawal, especially given her seizure disorder, but right now she is not showing any signs of shakiness, no signs of delirium or confusion; and she does not typically have problems coming off of alcohol. In addition, she has been restarted on her anticonvulsants, and I will be putting her back on at least some dose of benzodiazepine.   TREATMENT PLAN: The patient was restarted on her Seroquel. I am changing the dose slightly to her usual dose  of 50 mg in the morning and 300 at night. Amitriptyline continued at 50 mg at night. Resume Xanax 1 mg b.i.d. Patient does not have a clear history of having abused benzodiazepines in the past. They do seem to have been of some help with her chronic anxiety and have been prescribed by outpatient psychiatrist. Monitor vital signs and pulse. If it looks like she is having more alcohol withdrawal symptoms we would want to treat that with more benzodiazepines. Supportive and educational therapy done. Discussed her overall treatment. Discussed with her the importance to her physical and mental health of staying off of alcohol in the future. The patient is going to need referral to an outpatient psychiatrist at discharge, and we should be able to do that. We will follow up.   DIAGNOSIS PRINCIPLE AND PRIMARY:  AXIS I: Depression, not otherwise specified.   SECONDARY DIAGNOSES:  AXIS I: Alcohol abuse.  AXIS II: Deferred.  AXIS III: Acute pancreatitis, chronic obstructive pulmonary disease, chronic and acute alcoholic hepatitis, history of brain tumor with seizure disorder.  AXIS IV: Severe from financial difficulties and being off her medicine.  AXIS V: Functioning at time of evaluation 30.  TOTAL TIME SPENT: 60 minutes.      ____________________________ Gonzella Lex, MD jtc:vtd D: 02/20/2012 12:58:51 ET T: 02/20/2012 13:34:04 ET JOB#: [id number]  cc: Gonzella Lex, MD, <Dictator> Gonzella Lex MD ELECTRONICALLY SIGNED 02/20/2012 17:28

## 2014-10-25 NOTE — H&P (Signed)
PATIENT NAME:  [name], [name] MR#:  [id number] DATE OF BIRTH:  02-20-1956  DATE OF ADMISSION:  03/28/2012  PRIMARY CARE PHYSICIAN: She has no local doctor. She did not follow-up.   REFERRING PHYSICIAN: Dr. Lavonia Drafts.   CHIEF COMPLAINT: Black tarry stool, diarrhea and abdominal pain.   HISTORY OF PRESENT ILLNESS: Ms. Noto is a [name] with past medical history of liver cirrhosis, chronic hepatitis C, chronic alcoholism, seizure disorder, chronic obstructive pulmonary disease, hypertension, schizophrenia and noncompliance. She was admitted last time a month ago on 08/14 and discharged on 08/19 after treatment of abdominal pain, nausea and vomiting. The patient left the hospital prematurely. She is supposed to follow up with her primary care physician. However, she did not find one. She did not take some of her medications including the omeprazole and the Seroquel. She is still drinking alcohol, six beers a day, and abuses tobacco. She presented to the Emergency Department with four day history of nausea, vomiting, and diarrhea. However, she reports black, tarry stools that started today. She also describes two day history of left upper quadrant abdominal pain, sharp in nature, severity is 9 on a scale of 10. The patient tells me that she was taking Goody powder for this pain because she did not have enough OxyContin or oxycodone. Rectal examination at the Emergency Department revealed strongly heme positive stool. The stool appears to be tarry and black. The patient was admitted for further evaluation and treatment.   REVIEW OF SYSTEMS: CONSTITUTIONAL: Denies any fever. No chills. No fatigue. EYES: No blurring of vision. No double vision. ENT: No hearing impairment. No sore throat. No dysphagia. CARDIOVASCULAR: No chest pain. No shortness of breath. No syncope. RESPIRATORY: No cough. No sputum production. No chest pain. No shortness of breath. GASTROINTESTINAL: Reports left  upper quadrant abdominal pain, nausea, vomiting, and diarrhea along with melena. GENITOURINARY: No dysuria. No frequency of urination. MUSCULOSKELETAL: No joint pain or swelling. No muscular pain or swelling. INTEGUMENTARY: No skin rash other than chronic skin lesion on her left hand finger. No ulcers. NEUROLOGY: No focal weakness. No seizure activity lately. No headache. PSYCHIATRY: No anxiety or depression. ENDOCRINE: No heat or cold intolerance. No polyuria or polydipsia.   PAST MEDICAL HISTORY:  1. Last admission to this hospital was 08/14 and discharged on 08/19, a month ago, when she came with abdominal pain, nausea and vomiting secondary to acute on chronic pancreatitis.  2. History of alcoholic liver cirrhosis.  3. Chronic hepatitis C.  4. Seizure disorder.  5. 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  6. Portal hypertensive gastropathy. 7. Hypertension. 8. Hiatal hernia. 9. Chronic obstructive pulmonary disease. 10. Tobacco abuse.  11. Alcohol abuse.  12. Osteoporosis. 13. History of stroke in the past with minimal left-sided weakness. 14. History of schizophrenia. 15. History of chronic pancreatitis. 16. History of noncompliance.   PAST SURGICAL HISTORY:  1. Cholecystectomy.  2. Appendectomy.  3. Craniotomy. 4. History of colostomy for rectovaginal fistula repair.  5. History of rectovaginal fistula repair.  6. History of left knee surgery.   FAMILY HISTORY: Her father died at age of 68 from heart attack. Her mother is alive and she suffers from hypertension.   SOCIAL HABITS: Chronic smoker. She continued to smoke although she cut down to 1/2 pack a day. She continues to drink alcohol, primarily beer. She cut down to six beers a day. No other drug abuse.  SOCIAL HISTORY: She is married, living with her husband.   ADMISSION MEDICATIONS: She is on a number of medications but she is not taking them all.   1. Spironolactone 50 mg 3 times a day.  2. Toprol-XL 25 mg once a day. 3. Amlodipine 10 mg a day.  4. Omeprazole 40 mg twice a day but she is not taking it.  5. Dilantin 9 milliliters twice a day, that is about 225 mg twice a day.  6. Keppra 500 mg twice a day. 7. Percocet 10/325 q.6 hours p.r.n. She is not taking it. She is taking Goody powder instead. 8. Amitriptyline 50 mg at bedtime. She is not taking it.  9. Lexapro 20 mg a day.  10. Quetiapine 100 mg taking 3-1/2 tablets at bedtime and 50 mg in the morning. She is not taking this medication now. 11. Ambien 10 mg at bedtime. She is not taking it.  12. Advair Diskus 250/50 twice a day. 13. Creon 1,200 units, 2-capsule 3 times a day.  14. Spiriva 1 inhalation once a day. 15. Triamcinolone 0.1% cream to her finger twice a day. 16. Atrovent with albuterol inhalation p.r.n. for wheezing.   ALLERGIES: Penicillin and sulfa causing hives or skin rash. Dilaudid causing itching. Ibuprofen, unknown, but with gastrointestinal side effects. She is also allergic to strawberries and chocolate.    PHYSICAL EXAMINATION:  VITAL SIGNS: Blood pressure 99/76, pulse 84, temperature 98.6, oxygen saturation 94%.   GENERAL APPEARANCE: Middle-aged female lying in bed in no acute distress.   HEENT: No pallor. No icterus. No cyanosis.   ENT: Hearing was normal. Nasal mucosa, lips, tongue were normal.   EYES: Normal eyelids and conjunctiva. Pupils about 3 to 4 millimeters, could not detect if reactive to light.   NECK: Supple. Trachea at midline. No thyromegaly. No cervical lymphadenopathy. No masses.   HEART: Normal S1, S2. Distant heart sounds. No murmur was appreciated. No gallop. No carotid bruits.   RESPIRATORY: Normal breathing pattern without use of accessory muscles. No rales. No wheezing.   ABDOMEN: Obese, soft. There is tenderness at the left upper quadrant area upon deep palpation. No rebound or rigidity. There are multiple scar tissues over  the abdomen. No hepatosplenomegaly. No masses.   SKIN: No ulcers. No subcutaneous nodules.   MUSCULOSKELETAL: No joint swelling. No clubbing.   NEUROLOGIC: Cranial nerves II through XII are intact. No focal motor deficit. No flapping, tremors or asterixis.   PSYCHIATRIC: The patient is alert and oriented x3. Mood and affect were normal.   LABORATORY, DIAGNOSTIC, AND RADIOLOGICAL DATA: CT scan of the abdomen showed no acute inflammatory stranding. Changes consistent with mild psoriatic changes of the liver. Stable splenic hilar varices consistent with her postal hypertension. Stable left adrenal nodule. Serum glucose 104, BUN 13, creatinine 0.8, sodium 135, potassium 3.8, calcium 10.4, lipase 826, total protein 9.6, alkaline phosphatase elevated at 232, AST 221, ALT 168, bilirubin 1, serum albumin 3.7. CBC showed white count of 9,000, hemoglobin 13, hematocrit 41, platelet count 195. Urinalysis was unremarkable.   ASSESSMENT:  1. Melena likely secondary to upper gastrointestinal bleed, probably precipitated by noncompliance. She is not taking her omeprazole, in addition of taking nonsteroidal anti-inflammatory medication, that is Goody powder.  2. Left upper quadrant abdominal pain exacerbated secondary to flare-up of her pancreatitis likely precipitated by her alcohol intake.  3. Nausea, vomiting, and diarrhea. 4. Liver cirrhosis.  5. Chronic hepatitis C.  6. Chronic alcoholism. 7. Seizure disorder.  8. Chronic obstructive pulmonary disease.  9. Portal hypertensive gastropathy.  10. History of hiatal hernia.  11. Schizophrenia. 12. History of stroke with mild left-sided weakness.  13. Chronic pancreatitis.  14. History of cholecystectomy.  15. Appendectomy.   PLAN:  1. Will admit the patient to the medical floor. 2. Follow-up on hemoglobin q.8 hours x3.  3. Gastroenterology consultation.  4. Continue medications as listed above.  5. We will resume proton pump inhibitor.  6. I will  place her on Protonix 40 mg IV twice a day pending further gastroenterology work-up.  7. The patient needs to quit tobacco and alcohol.  8. I will hold amlodipine if systolic blood pressure less than 110.  9. Pain control. 10. I will keep her n.p.o. except for her medications.  11. Gentle IV hydration.  12. TED stockings, knee-high, for deep vein thrombosis prophylaxis.  13. Anticoagulation with Lovenox or other chemical anticoagulation is contraindicated in her case.  TIME SPENT EVALUATING THIS PATIENT: More than one hour due to its complexity and including reviewing her medical records.   ____________________________ Clovis Pu. Lenore Manner, MD amd:ap D: 03/28/2012 00:30:41 ET T: 03/28/2012 09:01:21 ET JOB#: [id number]  cc: Clovis Pu. Lenore Manner, MD, <Dictator> Mike Craze Irven Coe MD ELECTRONICALLY SIGNED 03/29/2012 0:31

## 2014-10-25 NOTE — H&P (Signed)
PATIENT NAME:  [name], [name] MR#:  [id number] DATE OF BIRTH:  07/01/1956  DATE OF ADMISSION:  02/19/2012  ADMITTING PHYSICIAN: Gladstone Lighter, MD   PRIMARY CARE PHYSICIAN: Currently none.   CHIEF COMPLAINT: Nausea, vomiting, abdominal pain.   HISTORY OF PRESENT ILLNESS: [name] is a 59 year old obese African American female with past medical history significant for history of seizure disorder, history of astrocytoma in the brain status post resection, chronic obstructive pulmonary disease, ongoing smoking and alcohol use, presents to the hospital complaining of abdominal pain, nausea and vomiting for three days. The patient has a known history of chronic pancreatitis and prior admissions for the same. She says she has been staying away from alcohol for several days but has been depressed over the past couple of weeks and started drinking about one case of beer every day. Over the last three days, she has not been feeling well. She was sick, fever, nausea, vomiting, diarrhea with left lower quadrant and epigastric abdominal pain. She does have a chronic cough from her chronic obstructive pulmonary disease, but her cough just got worse over the last three days, associated with decreased frequency of urination and also dark urine which has a strong odor. Since her symptoms were not getting better, she had a seizure yesterday and so presents through the Emergency Department  today. She is also saying that she cannot afford her medications, doesn't have a primary care physician, and has not been taking any of her medications lately.   PAST MEDICAL HISTORY:  1. History of brain tumor-astrocytoma, status post craniotomy and resection at [facility] in the past.  2. Seizure disorder.  3. Hepatitis C.  4. Alcoholic liver cirrhosis.  5. Anxiety and depression.  6. Portal hypertensive gastropathy.  7. Noncompliance with medications.  8. Hypertension.  9. Hiatal hernia.  10. Chronic obstructive  pulmonary disease.  11. Tobacco use disorder.  12. Alcohol abuse.  13. Osteoporosis.  14. History of CVA with minimal left-sided weakness. This happened during her brain tumor surgery.  15. Schizophrenia.  16. History of chronic pancreatitis.   PAST SURGICAL HISTORY:  1. Appendectomy.  2. Cholecystectomy.  3. Left craniotomy.  4. Rectovaginal fistula repair.  5. History of colostomy for the rectovaginal fistula and taken down last year.  6. Left kneecap surgery from trauma.   ALLERGIES TO MEDICATIONS: Penicillin, sulfa, Dilaudid, chocolate, strawberries, ibuprofen.   CURRENT MEDICATIONS:   1. Advair 250/50, 1 puff b.i.d.  2. Albuterol nebulizer 3 mL every 6 hours p.r.n.  3. Ambien 10 mg at bedtime p.r.n.  4. Amitriptyline 50 mg p.o. at bedtime.  5. Amlodipine 10 mg p.o. daily.  6. Creon 12,000 unit capsules, 3 capsules p.o. t.i.d. with meals.  7. Dilantin 125 mg in 5 mL suspension, 9 mL p.o. b.i.d.  8. Lexapro 20 mg p.o. daily.  9. Famotidine 20 mg p.o. b.i.d. 10. Keppra 500 mg p.o. b.i.d.  11. Toprol 25 mg p.o. daily.  12. Omeprazole 40 mg p.o. b.i.d.  13. Percocet 10/325 mg, 1 tablet every 6 hours p.r.n. for pain.  14. Quetiapine 350 mg at bedtime.  15. Quetiapine 50 mg in the morning.  16. Spiriva inhalation capsule, 18 mcg daily.  17. Triamcinolone topical ointment applied to the left ring finger b.i.d.   SOCIAL HISTORY: She lives at home with her husband. She smokes about 1 pack per day. She states she quit alcohol for several days but just restarted again two weeks ago and is drinking a 12-pack  of beer every day.    REVIEW OF SYSTEMS: CONSTITUTIONAL: Positive for fever, fatigue and weakness. No weight loss or weight gain. EYES: No blurred vision, double vision, inflammation, or glaucoma. ENT: No tinnitus, ear pain, epistaxis or discharge. RESPIRATORY: Positive for cough and chronic obstructive pulmonary disease. CARDIOVASCULAR: No chest pain, orthopnea, edema, arrhythmia,  palpitations, or syncope. GI: Positive for nausea, vomiting, diarrhea, abdominal pain. No hematemesis or melena. GU: Positive for decreased frequency of urination. No dysuria or hematuria. ENDOCRINE: No polyuria, nocturia, thyroid problems, heat or cold intolerance. HEMATOLOGY: No anemia, easy bruising or bleeding. SKIN: No acne, rash, or lesions. MUSCULOSKELETAL: No neck, back, or shoulder pain. Positive for arthritis. NEUROLOGICAL: History of brain tumor surgery and also residual CVA during the surgery with minimal left-sided weakness. PSYCHOLOGICAL: No anxiety. Positive for depression and also insomnia.   PHYSICAL EXAMINATION:  VITAL SIGNS: Temperature 98.6 degrees Fahrenheit, pulse 107, respirations 24, blood pressure 117/87, pulse oximetry 95% percent on room air.   GENERAL: Heavily built, well nourished female lying in bed, not in any acute distress.   HEENT: Normocephalic, atraumatic. Pupils are equal, round, reacting to light. Anicteric sclerae. Extraocular movements intact. Oropharynx is clear without erythema, mass or exudates.   NECK: Supple. No thyromegaly, jugular venous distention or carotid bruits. No lymphadenopathy.   LUNGS: Clear to auscultation bilaterally. No wheeze or crackles. No use of accessory muscles for breathing.   CARDIOVASCULAR: S1, S2, regular rate and rhythm. No murmurs, rubs or gallops.   ABDOMEN: Obese, tender in the epigastric and also left lower quadrant regions with voluntary guarding, no rigidity. No hepatosplenomegaly. Normal bowel sounds.   EXTREMITIES: No pedal edema. No clubbing or cyanosis. Dorsalis pedis pulses 2+ palpable bilaterally.  SKIN: No acne, rash, or lesions.   LYMPH: No cervical or inguinal lymphadenopathy.   NEUROLOGICAL: Cranial nerves are intact. No focal motor or sensory deficits.   PSYCHOLOGICAL: The patient says that she is depressed,  but she is alert oriented x3.   LABORATORY, DIAGNOSTIC AND RADIOLOGICAL DATA: WBC 6.0,  hemoglobin 13.6, hematocrit 39.6, platelet count 145.0. Sodium 139, potassium 3.8, chloride 107, bicarbonate 25, BUN 6, creatinine 0.7, glucose is 75 and calcium 9.2. ALT 88, AST 167, total bilirubin is 1.0, alkaline phosphatase 221, albumin 3.7, lipase elevated at 685.0. Troponin negative. Serum Dilantin low at 9.1. Chest x-ray is pending at this time. EKG showing sinus tachycardia, heart rate of 168.   ASSESSMENT AND PLAN: This is a 59 year old obese female with history of chronic pancreatitis, seizure disorder, brain surgery, alcohol abuse, admitted for nausea, vomiting, and diarrhea.   1. Acute on chronic pancreatitis: Likely triggered by stress lately. Lipase is only mildly elevated, but the patient appears distressed with nausea, vomiting, and abdominal pain. We will keep her on a clear liquid diet, get a CT of her abdomen, especially with recent colostomy reversal, history of rectovaginal fistula. She is also a little tender in the left lower quadrant on examination. We will rule out diverticulitis with her prior history of diverticulosis. Hold off on antibiotics until CT of the abdomen is done. Pain medication and nausea medication p.r.n. and gentle IV hydration.  2. Depression: The patient admits to depressive feelings without any suicidal or homicidal ideation. We will get Psychiatric consult. Continue Seroquel for now.  3. Seizure disorder: The patient is not taking medications at home. She had a seizure at home yesterday. Continue her Keppra and Dilantin.  4. Chronic obstructive pulmonary disease with cough: Cough medication p.r.n. No need for  steroids now.  Follow-up chest x-ray. Continue Advair and Spiriva.  5. Elevated liver function tests: From cirrhosis and also has history of hepatitis C. Follow-up CT abdomen and follow-up tests in the a.m.  6. Tobacco use disorder: She was counseled for three minutes. The patient refuses treatment or nicotine patch at this time.   7. Alcohol abuse: She  has been drinking again, 12-pack of beer a day. She denies any prior withdrawal symptoms, but with her seizure disorder alcohol can reduce seizure threshold; so we will start her on CIWA protocol.  8. GI and deep vein thrombosis prophylaxis with Prilosec and also TED stockings status.  9. Care Management consult requested as the patient has been noncompliant with medications to see if she needs any help with medications   CODE STATUS:  FULL CODE.     TIME SPENT ON ADMISSION: 50 minutes.   ____________________________ Gladstone Lighter, MD rk:cbb D: 02/19/2012 17:55:03 ET T: 02/19/2012 18:29:30 ET JOB#: [id number]  cc: Gladstone Lighter, MD, <Dictator> Gladstone Lighter MD ELECTRONICALLY SIGNED 02/19/2012 20:53

## 2014-10-25 NOTE — Consult Note (Signed)
Brief Consult Note: Diagnosis: Known history of chronic pancreatitis.  Currently acute pancreatitis on chronic pancreatitis.  History of alcoholic cirrhosis.  Nausea.  History of continued alcohol abuse.  Nonadherence to medication therapy.   Orders entered.   Discussed with Attending MD.   Comments: Patient's presentation discussed with Dr. Gaylyn Cheers.  Recommendations as follows:  Adding one MV daily.  Continue with phenergan as ordered for management of nausea.  PT/INR ordered due to history of alcoholic cirrhosis.  Presentation of acute pancreatitis on chronic pancreatitis related to recent alcohol use and non adherence with medication therapy.  She has been off of pancreatic enzyme therapy for one month. Will continue to monitor patient's progression as well results.  NPO except for ice chips.  IV hydration and continued pain management.  Electronic Signatures: Payton Emerald (NP)  (Signed 17-Aug-13 14:38)  Authored: Brief Consult Note   Last Updated: 17-Aug-13 14:38 by Payton Emerald (NP)

## 2014-10-25 NOTE — Consult Note (Signed)
PATIENT NAME:  [name], [name] MR#:  [id number] DATE OF BIRTH:  December 24, 1955  DATE OF CONSULTATION:  02/22/2012  CONSULTING PHYSICIAN:  Gaylyn Cheers, MD/Dawn Ruthe Mannan, NP  ADDENDUM:   IMPRESSION:  1. Known history of chronic pancreatitis, currently presentation of acute pancreatitis on chronic. 2. History of alcohol abuse with recent alcohol consumption again.  3. Abdominal pain, nausea felt to be in correlation with the presentation of pancreatitis. 4. Known history of alcoholic cirrhosis.  5. Nonadherence with medication therapy.   PLAN: The patient's presentation was discussed with Dr. Gaylyn Cheers. Recommendations are as follows:  1. Adding one multivitamin daily.  2. Continue with Phenergan as ordered for management of nausea. 3. PT-INR ordered due to history of alcoholic cirrhosis and presentation of acute pancreatitis on chronic pancreatitis related to recent alcohol use and nonadherence to medication therapy. She has been off of pancreatic enzyme therapy for one month.  4. We will continue to monitor the patient's progress during her hospitalization as well as laboratory results.  5. In agreement with ice chips at this time as her diet.  6. Continue IV hydration as well as pain management.   These services provided by Payton Emerald, MS, APRN, [facility], FNP,  under collaborative agreement with Gaylyn Cheers, MD.   ____________________________ Payton Emerald, NP dsh:cbb D: 02/22/2012 14:38:37 ET T: 02/22/2012 15:36:49 ET JOB#: [id number]  cc: Payton Emerald, NP, <Dictator> Payton Emerald MD ELECTRONICALLY SIGNED 02/23/2012 14:09

## 2014-10-25 NOTE — Consult Note (Signed)
Feels better. Some abd pain. LFT improving. Lipase improving. EGD showed no active bleeding. Either chronic gastritis vs portal gastropathy present. Bx's taken. Also duodenal polyp seen. This was biopsied. Low fat diet ordered. If tolerated, ok for discharge later today on low fat diet, daily PPI, and no NSAIDS or alcohol. Also, send home on creon 72000 units per meal Lactulose 15cc bid..Pt can f/u with [place] in office later. WIll sign off. Thanks.    Electronic Signatures: Verdie Shire (MD) (Signed on 23-Sep-13 11:48)  Authored   Last Updated: 23-Sep-13 11:50 by Verdie Shire (MD)

## 2014-10-25 NOTE — Consult Note (Signed)
CC: pancreatitis.  Pt is asking for food, has less pain today.  abd soft only very minimal tenderness, no HSM, bowel sounds present.  Lipase 1100. Will try clear liquids at her request. Pt without primary doctor and wanting to find one.  Electronic Signatures: Manya Silvas (MD)  (Signed on 18-Aug-13 12:45)  Authored  Last Updated: 18-Aug-13 12:45 by Manya Silvas (MD)

## 2014-10-25 NOTE — Consult Note (Signed)
PATIENT NAME:  [name], [name] MR#:  [id number] DATE OF BIRTH:  [birth year]  DATE OF CONSULTATION:  02/22/2012  CONSULTING PHYSICIAN:  Gaylyn Cheers, MD/Pinkey Mcjunkin Ruthe Mannan, NP PRIMARY CARE PHYSICIAN: None.   REASON FOR CONSULTATION: Pancreatitis, nausea, vomiting, abdominal pain.   HISTORY OF PRESENT ILLNESS: [name] is a [name] who has a significant past medical history of brain tumor, seizure disorder, hepatitis C, alcoholic liver cirrhosis, anxiety, depression, portal hypertensive gastropathy, noncompliance with medication therapy, hypertension, hiatal hernia, as well as chronic pancreatitis, chronic obstructive pulmonary disease, alcohol abuse, cerebrovascular accident, osteoporosis and schizophrenia.   [name] presented to [facility] Emergency Room with a three-day history prior to admission of having a fever. This past Tuesday her fever was 103. She states she has been having abdominal pain to lower abdomen radiating up to her right upper quadrant where it is more marked in tenderness. She states her urine started to be coffee-like in color with a very strong odor with voiding, and then the onset of lumbar spinal pain. During the course of her hospitalization, her lipase level has continued to rise. She states that she had abstained from alcohol for 147 days up until two weeks ago when she started drinking again. She has been drinking a 12-pack a day of beer. Unfortunately, she has recently lost her father as well as a granddaughter. Additionally, she has not had a primary care physician for the past month. Thus, she has been out of most of her medications, including her Creon with the recommendations of taking 48,000 units 3 times a day with meals.   She has had a dry cough over the past three days. She has been experiencing fluctuation in bowel habit between constipation and loose stools. She recently had surgery by Dr. Bronson Ing for rectovaginal fistula being repaired  which required a colostomy and then was reversed by Dr. Rochel Brome. She does require the use of laxatives over the counter as needed. She did receive milk of magnesia yesterday and experienced several loose stools, even fecal incontinence. No heartburn, indigestion, reflux. No gross evidence of rectal bleeding. She does notice bright red blood on toilet paper at times with cleansing. No melena. It appears that she had a seizure the day prior to being admitted to the hospital.   PAST MEDICAL HISTORY:  1. History of brain tumor, astrocytoma, status post craniotomy and resection at [facility].  2. Seizure disorder. 3. Hepatitis C. 4. Alcoholic liver cirrhosis. 5. Anxiety. 6. Depression. 7. Portal hypertensive gastropathy.  8. Noncompliance with medication therapy. 9. Hypertension. 10. Hiatal hernia.  11. Chronic obstructive pulmonary disease. 12. Tobacco abuse.  13. Alcohol abuse.  14. Continued osteoporosis. 15. History of CVA with minimal left-sided weakness, happened during brain tumor surgery. 16. Schizophrenia. 17. Chronic pancreatitis.  18. Rectovaginal fistula, status post repair.  19. Colostomy with reversal surgeries.   PAST SURGICAL HISTORY: 1. Appendectomy.  2. Cholecystectomy.  3. Left craniotomy. 4. Rectovaginal fistula repair with takedown last year.  5. Left kneecap surgery status post trauma.   ALLERGIES: Penicillin, sulfa, Dilaudid, chocolate, strawberries and ibuprofen, although she states she has been taking ibuprofen on a daily basis.   CURRENT MEDICATIONS:  1. Advair 250/50, 1 puff b.i.d.  2. Keppra 500 mg p.o. b.i.d.  3. Triamcinolone topical ointment applied to left ring finger b.i.d.   Current medications which she is supposed to be taking but has not for the past month: 1. Albuterol nebulizer 3 mL every 6 hours as  needed. 2. Ambien 10 mg at bedtime p.r.n.  3. Amitriptyline 50 mg p.o. at bedtime.  4. Amlodipine 10 mg daily. 5. Creon 12,000 unit capsules, 3  capsules p.o. t.i.d. with meals. 6. Dilantin 125 mg in 5 mL suspension and 9 mL p.o. b.i.d.  7. Lexapro 20 mg daily.  8. Famotidine 20 mg p.o. b.i.d.  9. Toprol 25 mg daily. 10. Omeprazole 40 mg p.o. b.i.d.  11. Percocet 10/325 mg 1 tablet every 6 hours as needed for pain. 12. Quetiapine 300 mg at bedtime.  13. Quetiapine 50 mg in the morning.  14. Spiriva inhalation capsule 18 mcg daily.   SOCIAL HISTORY: She resides with her husband. She smokes a pack of cigarettes a day. History of heavy alcohol use, had quit up until two weeks ago, a total of 147 days, for the past two weeks a 12-pack daily. No recreational drug use.   FAMILY HISTORY: Noncontributory.    REVIEW OF SYSTEMS: CONSTITUTIONAL: Significant for fever, fatigue, weight gain of 52 pounds over the past three months. EYES: No blurred vision, double vision, glaucoma. ENT: No tinnitus, ear pain, epistaxis, discharge. RESPIRATORY: Significant for dry cough for the past three days, known history of chronic obstructive pulmonary disease. Continued tobacco abuse. CARDIOVASCULAR: No chest pain, arrhythmias, palpitations, syncope. GI: See history of present illness. GU: Decreased urination frequency, subjective complaint of hematuria. ENDOCRINE: No polyuria, nocturia, thyroid problems, heat or cold intolerance. HEMATOLOGIC: Significant for easy bruising and bleeding. SKIN: No rashes. No lesions. No jaundice. NEUROLOGIC: History of CVA, seizure disorder. PSYCHIATRIC : Significant for depression, known history of schizophrenia, and complains of depression given the recent loss of two family members.   PHYSICAL EXAMINATION:  VITAL SIGNS: Temperature is 98, pulse is 82, respirations are 18, blood pressure is 108/69, and pulse oximetry of 94%.   GENERAL: A well-developed, overweight 59 year old [place] American female who appears depressed, flat affect, depressive mood, appears resting comfortably in bed.   HEENT: Normocephalic, atraumatic. Pupils  are equal and reactive to light. Conjunctivae are clear. Sclerae anicteric.   NECK: Supple. Trachea midline. No lymphadenopathy or thyromegaly.  PULMONARY: Symmetric rise and fall of chest. Slightly decreased breath sounds noted bilaterally to lower lobes.   CARDIOVASCULAR: Regular rhythm, S1, S2.   ABDOMEN: Large, soft, nondistended. Bowel sounds in four quadrants. No masses. No bruits. Difficult to truly assess for evidence of hepatosplenomegaly given body habitus size, bowel sounds in four quadrants.   RECTAL: Deferred.   MUSCULOSKELETAL: Moving all four extremities. No contractures. No clubbing.   EXTREMITIES: No edema.   PSYCHIATRIC: Alert and oriented x4, again flat affect, depressive mood.   NEUROLOGICAL: No gross neurological deficits.   LABORATORY, DIAGNOSTIC, AND RADIOLOGICAL DATA: Chemistry panel on admission within normal limits except BUN was low at 6, lipase was 685, which has trended upwards during her admission, on August 15th 1026, August 16th 1810, and today's date, 1672. Potassium has dropped to 3.4 today with chloride elevation of 110, and calcium has declined from 9.2 to 8.4. Hepatic panel: Total protein on admission 9.4. Alkaline phosphatase 221, AST 167 and ALT 88. Total protein has dropped to 7.6 today. Albumin has declined to 3.1, alkaline phosphatase has dropped to 160, AST remains mildly elevated and improved at 51, and AST is normalized to 48. Troponin is less than 0.02, and Dilantin level is low at 9.1. CBC revealed platelet count of 145,000 on admission, which has dropped to 109,000 on today's date. Hemoglobin dropped from 13.6 to 11.5. Urine culture revealed  mixed bacterial organisms. Urinalysis on admission was essentially unremarkable with no evidence of hematuria. EKG shows sinus tachycardia with possible left atrial enlargement. CT of abdomen and pelvis with contrast on August 14th revealed liver to demonstrate a fine nodular border, was mildly atrophic. Spleen,  right adrenal, kidneys are unremarkable. Varices, mild, identified within the splenic hilum. The pancreas was unremarkable. Impression does reveal sequela of cirrhotic changes in the liver and possibly a complaint of mild or early portal venous hypertension. Chest x-ray, single view, on admission: There is hyperinflation consistent with chronic obstructive pulmonary disease and reactive airway disease. No pneumonia. Abdomen, three-way, performed today was unremarkable.   IMPRESSION: 1. Known history of alcoholic cirrhosis. 2. History of chronic pancreatitis, nonadherence with medication therapy.  3. She has not had a primary care physician for the past month.  4. History of depression, schizophrenia which has been exacerbated over the last couple of weeks with the loss of two family members.  5. She has resumed alcohol consumption and continued tobacco abuse.   PLAN: The patient's presentation will be discussed with Dr. Gaylyn Cheers. Recommendations are to follow in addendum form.    These services provided by Payton Emerald, MS, APRN, [facility], FNP,  under collaborative agreement with Gaylyn Cheers, MD.    ____________________________ Payton Emerald, NP dsh:cbb D: 02/22/2012 14:04:42 ET T: 02/22/2012 14:52:59 ET JOB#: [id number]  cc: Payton Emerald, NP, <Dictator> Payton Emerald MD ELECTRONICALLY SIGNED 02/23/2012 14:09

## 2014-10-25 NOTE — Discharge Summary (Signed)
PATIENT NAME:  [name], PITONES MR#:  [id number] DATE OF BIRTH:  09-08-1955  DATE OF ADMISSION:  03/27/2012 DATE OF DISCHARGE:  03/30/2012  ADMITTING DIAGNOSIS: Melena.  DISCHARGE DIAGNOSES:  1. Melena. 2. Mild posthemorrhagic anemia status post esophagogastroduodenoscopy on 03/30/2012 by Dr. Candace Cruise. Chronic gastritis versus portal gastropathy noted. Biopsies taken. Also duodenal polyp seen which was biopsied, results pending. 3. Alcoholic pancreatitis.  4. Diarrhea, nausea and vomiting. 5. Abdominal pain, diffuse.  6. Chronic pain syndrome.  7. Alcoholic hepatitis. 8. History of hepatitis C, not treated.  9. Abnormal liver function tests. 10. Seizure disorder.  11. History of brain tumor, astrocytoma, status post craniotomy and resection at [facility] in remote past.  12. History of hypertension.  13. Hiatal hernia. 14. Chronic obstructive pulmonary disease.  15. Tobacco abuse. 16. Alcohol abuse. 17. Osteoporosis. 18. History of stroke with minimal left-sided weakness. 19. Schizophrenia. 20. Medical noncompliance. 21. Pain medication seeking behavior. 22. Hyperammonemia. 23. Altered mental status, questionably related to hepatic encephalopathy.  DISCHARGE CONDITION: Stable.   DISCHARGE MEDICATIONS: The patient is to resume the following. 1. Spironolactone 50 mg p.o. three times daily and 50 mg at bedtime.  2. Percocet 10/325 mg one tablet every six hours as needed.  3. Dilantin 225 mg twice a day.  4. Keppra 500 mg p.o. twice a day.  5. Escitalopram 20 mg p.o. daily.  6. Seroquel 350 mg p.o. at bedtime and 50 mg p.o. in the morning.  7. Ambien 10 mg p.o. at bedtime.  8. Metoprolol succinate 25 mg p.o. daily.  9. Advair Diskus 250/50 one puff twice daily.  10. Spiriva one inhalation once daily.  11. Amlodipine 10 mg p.o. daily.  12. Triamcinolone 0.1% topical cream one application twice daily as needed.  13. Creon 6 capsules three times daily, this was changed from 2 capsules three  times daily.  14. Omeprazole 40 mg p.o. one capsule twice daily.  15. Albuterol ipratropium inhalation solution SVNs four times daily as needed.  16. Combivent 2 puffs four times daily as needed.  17. Lactulose 30 mL twice daily.  18. Nicotine oral inhaler one every one hour as needed.  19. Nicotine transdermal patch 14 mg topically daily.   DISCHARGE DIET: Low salt, low fat, low cholesterol, mechanical soft. No alcohol. No nonsteroidal inflammatory medications.  DISCHARGE FOLLOWUP: Followup with primary care physician in 2 to 3 days after discharge. The patient is to call DSS and find out about her primary care physician at this point. This was discussed with Education officer, museum, [place].   HISTORY OF PRESENT ILLNESS: The patient is a 59 year old female with past medical history significant for history of alcohol abuse who presented to the hospital with complaints of nausea and vomiting as well as diarrhea. Please refer to Dr. Zacarias Pontes admission note on 03/28/2012.   On arrival to the Emergency Room, the patient's blood pressure was 99/76, pulse was 84, temperature was 98.6, and oxygen saturation was 94%. Physical exam revealed obese abdomen and some tenderness in left upper quadrant was noted on deep palpation. Multiple scar tissue over the abdomen was also noted, but no hepatosplenomegaly or masses were noted or localized discomfort.   LABORATORY, DIAGNOSTIC AND RADIOLOGIC DATA: On 03/27/2012, glucose was 104, sodium 135, and bicarbonate level was low at 20. The patient's calcium level was elevated at 10.4 and lipase level was elevated at 826. Liver enzymes showed total protein of 9.6, alkaline phosphatase 232, AST 221, and ALT 168 with normal bilirubin level. CBC: White blood  cell count 9.1, hemoglobin 13.8, platelet count 195, and absolute neutrophil count was normal at 5.4. Coagulation panel revealed pro time of 15.6 and INR 1.2.   Urinalysis was remarkable for amber cloudy urine, negative for  glucose, bilirubin or ketones, specific gravity 1.013, pH 6.0, 3+ blood, negative for protein, nitrites, or leukocyte esterase, 2 red blood cells as well as 2 white blood cells, 3+ bacteria, and 5 epithelial cells as well as mucus was present.  HOSPITAL COURSE: The patient was admitted to the hospital for further evaluation. She was started on IV fluids as well as antiemetics for her suspected acute pancreatitis related abdominal pain. She was kept n.p.o. and was given IV fluids. She was seen by Dr. Candace Cruise in consultation who felt that the patient may benefit from upper GI endoscopy done because she reported melanotic stool. Her hemoglobin was checked every eight hours, however, it did not show significant decline so EGD was performed on 03/30/2012. It revealed examined esophagus normal, diffuse mild inflammation characterized by erythema and granularity was found in the gastric body. Biopsies were taken with cold forceps for histology. Examination was otherwise without significant abnormality. One small sessile polyp was found in the duodenal bulb. Biopsies were taken with cold forceps for histology. Impression: normal esophagus and chronic gastritis biopsied, examination was otherwise normal. Single duodenal polyp biopsied, examination otherwise was normal. The patient was discharged from the EGD suite and she was recommended to continue low fat, low cholesterol diet. Dr. Candace Cruise also recommended the patient to be placed on 72,000 unit per meal of Creon as well as lactulose twice a day and Protonix. She also was recommended not to take any nonsteroidal antiinflammatory medications as well as alcohol. If the patient tolerates her diet well, with no increasing pain or nausea or vomiting as well as diarrhea, she will be discharged home today. Of note, the patient's sodium level normalized by 03/28/2012. Her lipase also was improving. The patient's liver enzymes were also improving with alkaline phosphatase improving.  However, the patient's liver enzymes remained remarkably abnormal with AST as well as ALT elevated at around 180s to 220 range, which possibly signifies chronic liver disease related to alcohol as well as hepatitis C. The patient is to establish a primary care physician with DSS recommended physician and follow-up with him as soon as possible. Of note, the patient's hemoglobin was checked, as mentioned above, and it drifted down to 11.7 by 03/30/2012. It was felt that the patient should continue her usual diet and follow up with her primary care physician for recommendations in regards to anemia management. In regards to the patient's diarrhea, it was felt that the patient's diarrhea was related to chronic pancreatic problems and Creon was recommended by the gastroenterologist. For abdominal pain, the patient is to continue her chronic pain management. She is to get pain clinic consultation in the nearest future, according to her. The patient is being discharged in stable condition with the above-mentioned medications and follow-up. Her vital signs on the day of discharge are temperature 98.4, pulse 85, respiratory rate 18, blood pressure is ranging from 99 to 120s, and saturation 96% on room air at rest.   TIME SPENT: 40 minutes. ____________________________ Theodoro Grist, MD rv:slb D: 03/30/2012 14:48:23 ET T: 04/01/2012 15:34:25 ET JOB#: [id number]  cc: Theodoro Grist, MD, <Dictator> Syrina Wake MD ELECTRONICALLY SIGNED 04/01/2012 18:27

## 2014-10-25 NOTE — Discharge Summary (Signed)
PATIENT NAME:  [name], [name] MR#:  [id number] DATE OF BIRTH:  07-Oct-1955  DATE OF ADMISSION:  02/19/2012 DATE OF DISCHARGE:  02/24/2012  ADMITTING DIAGNOSES: Nausea, vomiting, and abdominal pain.   DISCHARGE DIAGNOSES:  1. Nausea, vomiting, and abdominal pain due to acute on chronic pancreatitis, now symptoms improved. Her lipase continues to be elevated, however, the patient states that she wants to go home.  2. Depression, seen by psychiatry who recommended medical compliance and no alcohol drinking.  3. Urinary tract infection.  4. Seizure disorder.  5. Liver cirrhosis. The patient is encouraged to stop smoking.  6. Chronic obstructive pulmonary disease with mild acute exacerbation, improved with breathing treatments and inhalers.  7. Hypertension.  8. Morbid obesity.  9. History of brain tumor, astrocytoma, status post craniotomy and resection at [facility].  10. Seizure disorder.  11. Hepatitis C.  12. Portal hypertensive gastropathy. 13. Noncompliance with medications and followups.  14. History of hiatal hernia.  15. Tobacco use disorder.  16. Osteoporosis.  17. History of cerebrovascular accident with minimal left-sided weakness.  18. Schizophrenia.  19. Status post appendectomy.  20. Status post cholecystectomy.   PERTINENT LABS AND EVALUATIONS: Admitting WBC count was 6.0, hemoglobin 13.6, and platelet count 145. Sodium 139, potassium 3.8, chloride 107, bicarbonate 25, BUN 6, creatinine 0.7, glucose 75, calcium 9.2, ALT 88, AST 167, total bilirubin 1.0, alkaline phosphatase 221, and albumin 3.7. Lipase 685. Troponin was negative. Serum Dilantin level was 9.1.   EKG shows sinus tachycardia.   The patient's lipase has fluctuated from 685 to 1810.   Urine cultures shows greater than 100,000 Escherichia coli, ampicillin resistant.  CT of the abdomen and pelvis showed no CT evidence of obstructive or inflammatory abnormalities, likely cirrhotic changes.   HOSPITAL COURSE: The  patient is a [name] with history of chronic pancreatitis and alcohol abuse who reported that she was drinking heavily recently and came in with abdominal pain, nausea and vomiting. She was also having worsening shortness of breath. The patient was admitted for acute on chronic pancreatitis. She was kept n.p.o. with pain control. The patient after being n.p.o. for a day wanted to eat and her diet was slowly advanced. Her lipase continued to stay elevated; therefore, gastroenterology consult was obtained. They recommended continuing current treatment with slow increase in her diet. The patient today reports that her abdominal pain has resolved. Even though her lipase is still elevated she insists on going home. At this time, due to her pain being resolved, her lipase may be chronically elevated. Therefore, she is being discharged. She is strongly recommended to not drink. The patient also was noted to have a mild chronic obstructive pulmonary disease exacerbation which was treated with nebulizers and steroids. At this time she is doing much better and is stable. She is discharged home.  DISCHARGE MEDICATIONS: 1. Spironolactone 50 mg one tab p.o. three times daily. 2. Percocet 10/325 mg one tab p.o. every six hours p.r.n. pain. 3. Dilantin 9 mL orally twice a day.  4. Keppra 500 mg one tab p.o. twice a day. 5. Amitriptyline 50 mg at bedtime.  6. Lexapro 20 mg daily.  7. Quetiapine 100 mg, 3-1/2 tabs at bedtime. 8. Quetiapine 50 mg one tab p.o. every a.m.  9. Ambien 10 mg at bedtime.  10. Toprol-XL 25 mg p.o. daily.  11. Advair Diskus 250/50 one puff twice a day. 12. Amlodipine 10 mg daily.  13. Creon 1200 units delayed capsules two capsules three times  daily. 14. Omeprazole 40 mg one tab p.o. twice a day. 15. Spiriva 18 mcg one tab inhalation daily.  16. Triamcinolone 0.1% apply to affected area twice a day. 17. Cipro 500 mg one tab p.o. every 12 hours x5  days. 18. Albuterol/Atrovent nebulizers 3 mL inhalation four times daily p.r.n. for wheezing.   HOME OXYGEN: None.   DIET: Low fat, low cholesterol, regular consistency.   ACTIVITY: As tolerated.   TIMEFRAME FOR FOLLOW-UP: Followup in 2 to 4 weeks. DSS to assign MD to the patient according to case manager.   DISCHARGE INSTRUCTIONS: The patient is told to stop drinking. I have given her prescriptions for 30 days in duration due to her still looking for a primary MD.  TIME SPENT: 40 minutes. ____________________________ [name], MD shp:slb D: 02/24/2012 14:15:22 ET T: 02/25/2012 11:56:48 ET JOB#: [id number]  cc: Ryann Pauli H. Posey Pronto, MD, <Dictator> Alric Seton MD ELECTRONICALLY SIGNED 02/28/2012 16:01

## 2014-10-26 DIAGNOSIS — I10 Essential (primary) hypertension: Secondary | ICD-10-CM | POA: Diagnosis not present

## 2014-10-27 DIAGNOSIS — I10 Essential (primary) hypertension: Secondary | ICD-10-CM | POA: Diagnosis not present

## 2014-10-28 DIAGNOSIS — I10 Essential (primary) hypertension: Secondary | ICD-10-CM | POA: Diagnosis not present

## 2014-10-28 NOTE — Discharge Summary (Signed)
PATIENT NAME:  [name], [name] MR#:  [id number] DATE OF BIRTH:  18-Apr-1956  DATE OF ADMISSION:  09/22/2012 DATE OF DISCHARGE:  09/22/2012  For a detailed note, please take a look at the history and physical done on admission by Dr. Margaretmary Eddy.   DIAGNOSES AT DISCHARGE: Abdominal pain, likely related to chronic pancreatitis; chest pain, nonspecific and likely musculoskeletal in nature; chronic pain syndrome; history of alcohol abuse with chronic pancreatitis; chronic obstructive pulmonary disease; history of seizures; gastroesophageal reflux disease; hypertension.   DIET: The patient is being discharged on a low sodium, low fat diet.   ACTIVITY: As tolerated.   FOLLOWUP: The patient is going to follow up with [facility] and also is trying to get herself a primary care physician at [place]: Advair 250/50 one puff b.i.d., Spiriva 1 puff daily, Combivent 1 puff q.i.d. as needed, DuoNebs q.4 hours as needed, Aldactone 50 mg b.i.d., Keppra 5 mg b.i.d., amitriptyline 50 mg at bedtime, Seroquel 100 mg at bedtime, Ambien 10 mg at bedtime, Toprol 25 mg daily, amlodipine 10 mg daily, omeprazole 40 mg b.i.d. and Creon 12,000 international units 2 caps t.i.d. with meals.   PERTINENT STUDIES DONE DURING THE HOSPITAL COURSE: Chest x-ray done on admission showing hyperinflation with COPD and reactive airway disease. No evidence of pneumonia or CHF. A CT of the chest done with contrast showing compressive atelectasis. No focal pneumonia or parenchymal mass. No evidence of CHF. No mediastinal or hilar lymphadenopathy. Chronic changes in the pancreatic head consistent with pancreatitis.   BRIEF HOSPITAL COURSE: This is a 59 year old female who presented to the hospital with nausea, vomiting, abdominal pain and also chest pain.  1.  Abdominal pain, nausea, vomiting: This is likely secondary to her chronic pancreatitis. The patient has high drug-seeking behavior. Keeps  coming back to the hospital requiring high-dose narcotics. Her lipase is essentially normal. She has had no further nausea, vomiting in the hospital. I tried to advance her diet to see if she would be able to tolerated it but she refused to eat, although I gave her some crackers and ginger ale which she was able to tolerate and therefore is being discharged home. I did not discharge her on any narcotics even though she asked for them, as she has high drug-seeking behavior.  2.  COPD: This is chronic for the patient with ongoing tobacco abuse. She had noticed a COPD exacerbation. She will continue her Spiriva and Combivent and Advair as stated.  3.  History of seizures: The patient was maintained on Keppra and she will resume that.  4.  Hypertension: The patient remained hemodynamically stable. She will continue her Toprol and amlodipine as stated.  5.  GERD: The patient was maintained on omeprazole and she will resume that.  6.  Chronic pain syndrome: The patient has chronic abdominal pain and chest pain. I did not discharge her on any narcotics, as she has high drug-seeking behavior.  7.  Chronic pancreatitis: She will continue her Creon supplements.   CODE STATUS: The patient is a full code.   TIME SPENT: 35 minutes.    ____________________________ Belia Heman. Verdell Carmine, MD [id number] D: 09/22/2012 15:24:31 ET T: 09/22/2012 16:11:56 ET JOB#: [id number]  cc: Belia Heman. Verdell Carmine, MD, <Dictator> [facility], [place] Henreitta Leber MD ELECTRONICALLY SIGNED 10/04/2012 12:28

## 2014-10-28 NOTE — Consult Note (Signed)
Chief Complaint:   Chief Complaint Depression and alcohol abuse.   Presenting Symptoms:   Presenting Symptoms Sleep Disturbance  Mood Disturbance  Apathy/Lethargy   History of Present Illness:   History of Present Illness Pt is a 59 years old morbidly obese AA female admitted after she was having epigastric pain which was radiating to her back and was associated with nausea and vomiting. Pt has long h/o chronic pancreatitis, alcoholic liver disease, hepatitis but she resumed to drinking again due to worsening depressive symptoms. She stated that she recently lost her 66 years old grand daughter due to brain tumor. her 2 sons are on life sentence due to murder charges. She has a sister who is also fighting a terminal illness. She was on a liver transplant list at [facility] but she started drinking again due to poor sleep, depression, anhedonia, feeling hopeless and helpless. She stated that she was following with Dr Collie Siad- Psychiatrist in [place] but she moved to [place]. She has not taken any psychotropic medication in the last few months. She wants to restart her meds. However, the Seroquel makes her very tired and sleepy. She was taking Ambien in the past to help her sleep. She denied any suicidal ideations or plans at this time. Willing to have her meds adjusted.   Target Symptoms:   Depressive Interest Loss  Appetite Change  Sleep Change  Anhedonia  Depressed Mood    Capacity Recognizes the presence of illlness  Understands consequences of treatment refusal  Understands risks, benefits, alternatives  Exhibits acceptable reasoning/judgment  Communicates clear choices   Past Psychiatric Treatment: First Treatment: Was following Psychiatrist in the past and taking Seroquel, Xanax, Ambien, Amitriptyline.   Previous Hospitalizations: h/o admission at [facility] due to [place] 10 years ago.   History of Suicide Attempts: one previous attempt.   Current Outpatient Treatment: none.   Substance Abuse  Treatment History: denies.  Substance Abuse- Alcohol: Pt drinks beer to help with her depressive symptoms.  Substance Abuse- Cocaine: The patient denies any current use of cocaine or history of cocaine use..  Substance Abuse- Opiates: Takes pain meds to help with her pancreatitis.  Substance Abuse- Cannabis: The patient denies any cannabis use.Marland Kitchen  PAST MEDICAL & SURGICAL HX:  Significant Events:   brain tumor:    depression:    paranoid schizophrenic:    diabetes:    FISTULA:    KIDNEY FAILURE:    HTN:    SEIZURES:    STROKE:    MI:    COPD:    Cirrhosis:    Hepatitis C:    Eczema:    Cancer:    Osteoporosis:    Emphysema:    Alcohol Abuse:    FISTULA REPAIR:    KNEE REPLACEMENT:    Hysterectomy - Partial:    Colostomy:    Appendectomy:    Gall Bladder:    Brain Surgery:   CURRENT OUTPATIENT MEDICATIONS:  Home Medications: Medication Instructions Status  spironolactone 50 mg oral tablet 1 tab(s) orally 3 times a day Active  Percocet 10/325 oral tablet 1 tab(s) orally every 6 hours, As Needed- for Pain  Active  Dilantin-125 oral suspension 9 mL orally 2 times a day Active  levetiracetam 500 mg oral tablet 1 tab(s) orally 2 times a day Active  amitriptyline 50 mg oral tablet 1 tab(s) orally once a day (at bedtime) Active  escitalopram 20 mg oral tablet 1 tab(s) orally once a day Active  quetiapine 50 mg oral tablet 1  tab(s) orally once a day (in the morning) Active  Metoprolol Succinate ER 25 mg oral tablet, extended release 1 tab(s) orally once a day Active  Advair Diskus 250 mcg-50 mcg inhalation powder 1 puff(s) inhaled 2 times a day Active  Spiriva 18 mcg inhalation capsule 1 ea inhaled once a day Active  amlodipine 10 mg oral tablet 1 tab(s) orally once a day Active  omeprazole 40 mg delayed release capsule 1 cap(s) orally 2 times a day Active  lactulose 10 g/15 mL oral syrup 30 mL orally 2 times a day Active  zolpidem 10 mg oral  tablet 1 tab(s) orally once a day (at bedtime) Active  quetiapine 100 mg oral tablet 3 tab(s) orally once a day (at bedtime) Active  Creon 12,000 units oral delayed release capsule 1 cap(s) orally 3 times a day Active  albuterol-ipratropium 103 mcg-18 mcg/inh inhalation aerosol 2 puff(s) inhaled every 4 hours while awake Active   Family History: H/o Depression and impulsive behavior. Mother alive and has h/o HTN. Father died due to CAD.  Social History: Lives with husband. He does Biomedical scientist.  Legal History: The patient denies any history of arrests or previous incarcerations..  Mental Status Exam:   Mental Status Exam Morbidly obese female lying in the bed. She maintained fair eye contact. her speech was low in tone and volume.    Speech Slowed    Mood Depressed    Affect Blunted    Thought Processes Linear/logical/goal directed    Thought Content non delusional    Orientation Self  Place  Date  Situation and circumstance    Attention Alert    Concentration Fair    Memory Intact    Fund of Knowledge Fair    Language Fair    Judgement Fair    Insight Fair    Reliabiity Fair   Suicide Risk Assessment: Suicide Risk Level No risk inidicated.  Review of Systems:  Review of Systems:   Fever/Chills No    Cough Yes    Sputum No    Abdominal Pain Yes    Diarrhea No    Constipation No    Nausea/Vomiting No    SOB/DOE No    Chest Pain No    Telemetry Reviewed NSR    Dysuria No    Tolerating PT Yes    Tolerating Diet No    Medications/Allergies Reviewed Medications/Allergies reviewed   NURSING FLOWSHEETS:  Vital Signs/Nurse Notes-CM: ED Vital Sign Flow Sheet:   30-Dec-13 17:00   Pulse Pulse 113   Respirations Respirations 18   SBP SBP 129   DBP DBP 86    19:14   Pulse Pulse 97   Respirations Respirations 18   SBP SBP 145   DBP DBP 88   Pulse Ox % Pulse Ox % 97    19:31   Temp Temperature 99.5   Temp Source oral   Pulse Pulse 100    Respirations Respirations 20   SBP SBP 147   DBP DBP 83   Pulse Ox % Pulse Ox % 96    21:20   Pulse Pulse 97   Respirations Respirations 20   SBP SBP 128   DBP DBP 77   Pulse Ox % Pulse Ox % 97    21:50   Pulse Pulse 91   Respirations Respirations 20   SBP SBP 127   DBP DBP 73   Pulse Ox % Pulse Ox % 95    22:10   Pulse Pulse 88  Respirations Respirations 20   SBP SBP 132   DBP DBP 84   Pulse Ox % Pulse Ox % 95    23:34   Pulse Pulse 84   Respirations Respirations 20   SBP SBP 119   DBP DBP 84   Pulse Ox % Pulse Ox % 95   Telemetry pattern Cardiac Rhythm Normal sinus rhythm   LAB:  Laboratory Results: Hepatic:    30-Dec-13 17:04, Comprehensive Metabolic Panel   Bilirubin, Total 0.7   Alkaline Phosphatase 186   SGPT (ALT) 67   SGOT (AST) 112   Total Protein, Serum 8.8   Albumin, Serum 3.4    31-Dec-13 04:34, Comprehensive Metabolic Panel   Bilirubin, Total 0.7   Alkaline Phosphatase 184   SGPT (ALT) 59   SGOT (AST) 85   Total Protein, Serum 8.7   Albumin, Serum 3.3  Cardiology:    30-Dec-13 17:03, ECG   Ventricular Rate 98   Atrial Rate 98   P-R Interval 150   QRS Duration 88   QT 390   QTc 497   P Axis 35   R Axis 6   T Axis 33   ECG interpretation    Normal sinus rhythm  Prolonged QT  Abnormal ECG  When compared with ECG of 27-Mar-2012 18:52,  No significant change was found  ----------unconfirmed----------  Confirmed by OVERREAD, NOT ([id number]), editor PEARSON, BARBARA ([id number]) on 07/07/2012 9:50:28 AM   ECG   Routine Chem:    30-Dec-13 17:04, Comprehensive Metabolic Panel   Glucose, Serum 118   BUN 5   Creatinine (comp) 0.78   Sodium, Serum 137   Potassium, Serum 3.5   Chloride, Serum 105   CO2, Serum 21   Calcium (Total), Serum 8.9   Osmolality (calc) 272   eGFR (African American) >60   eGFR (Non-African American) >60   eGFR values <18mL/min/1.73 m2 may be an indication of chronic  kidney disease (CKD).  Calculated eGFR is useful in  patients with stable renal function.  The eGFR calculation will not be reliable in acutely ill patients  when serum creatinine is changing rapidly. It is not useful in   patients on dialysis. The eGFR calculation may not be applicable  to patients at the low and high extremes of body sizes, pregnant  women, and vegetarians.   Anion Gap 11    30-Dec-13 17:04, Lipase   Lipase 1241   Result(s) reported on 06 Jul 2012 at [facility].    31-Dec-13 04:34, Amylase, Serum   Amylase, Serum 87   Result(s) reported on 07 Jul 2012 at 05:33AM.    31-Dec-13 04:34, Comprehensive Metabolic Panel   Glucose, Serum 161   BUN 5   Creatinine (comp) 0.84   Sodium, Serum 140   Potassium, Serum 4.0   Chloride, Serum 108   CO2, Serum 25   Calcium (Total), Serum 8.8   Osmolality (calc) 280   eGFR (African American) >60   eGFR (Non-African American) >60   eGFR values <70mL/min/1.73 m2 may be an indication of chronic  kidney disease (CKD).  Calculated eGFR is useful in patients with stable renal function.  The eGFR calculation will not be reliable in acutely ill patients  when serum creatinine is changing rapidly. It is not useful in   patients on dialysis. The eGFR calculation may not be applicable  to patients at the low and high extremes of body sizes, pregnant  women, and vegetarians.   Anion Gap 7    31-Dec-13  04:34, Lipid Profile ([place])   Cholesterol, Serum 183   Triglycerides, Serum 76   HDL (INHOUSE) 36   VLDL Cholesterol Calculated 15   LDL Cholesterol Calculated 132   Result(s) reported on 07 Jul 2012 at 05:33AM.    02-Jan-14 41:74, Basic Metabolic Panel (w/Total Calcium)   Glucose, Serum 109   BUN 5   Creatinine (comp) 0.92   Sodium, Serum 139   Potassium, Serum 3.4   Chloride, Serum 106   CO2, Serum 24   Calcium (Total), Serum 8.6   Anion Gap 9   Osmolality (calc) 275   eGFR (African American) >60   eGFR (Non-African American) >60   eGFR values <78mL/min/1.73 m2 may be an indication  of chronic  kidney disease (CKD).  Calculated eGFR is useful in patients with stable renal function.  The eGFR calculation will not be reliable in acutely ill patients  when serum creatinine is changing rapidly. It is not useful in   patients on dialysis. The eGFR calculation may not be applicable  to patients at the low and high extremes of body sizes, pregnant  women, and vegetarians.    02-Jan-14 04:31, Lipase   Lipase 750   Result(s) reported on 09 Jul 2012 at 05:20AM.  Routine UA:    30-Dec-13 21:21, Urinalysis   Color (UA) Yellow   Clarity (UA) Clear   Glucose (UA) Negative   Bilirubin (UA) Negative   Ketones (UA) Negative   Specific Gravity (UA) 1.006   Blood (UA) 1+   pH (UA) 6.0   Protein (UA) Negative   Nitrite (UA) Negative   Leukocyte Esterase (UA) Negative   Result(s) reported on 06 Jul 2012 at 09:32PM.   RBC (UA) 9 /HPF   WBC (UA) <1 /HPF   Bacteria (UA) 3+   Epithelial Cells (UA) 2 /HPF   Mucous (UA) PRESENT   Result(s) reported on 06 Jul 2012 at 09:32PM.  Routine Hem:    30-Dec-13 17:04, Hemogram, Platelet Count   WBC (CBC) 6.5   RBC (CBC) 3.99   Hemoglobin (CBC) 11.8   Hematocrit (CBC) 36.5   Platelet Count (CBC) 158   Result(s) reported on 06 Jul 2012 at 05:36PM.   MCV 92   MCH 29.5   MCHC 32.3   RDW 17.0    31-Dec-13 04:34, CBC Profile   WBC (CBC) 3.9   RBC (CBC) 3.93   Hemoglobin (CBC) 11.9   Hematocrit (CBC) 36.6   Platelet Count (CBC) 148   MCV 93   MCH 30.3   MCHC 32.5   RDW 17.1   Neutrophil % 81.3   Lymphocyte % 17.2   Monocyte % 1.4   Eosinophil % 0.0   Basophil % 0.1   Neutrophil # 3.2   Lymphocyte # 0.7   Monocyte # 0.1   Eosinophil # 0.0   Basophil # 0.0   Result(s) reported on 07 Jul 2012 at 05:38AM.    02-Jan-14 04:31, CBC Profile   WBC (CBC) 5.6   RBC (CBC) 3.81   Hemoglobin (CBC) 11.5   Hematocrit (CBC) 35.2   Platelet Count (CBC) 127   MCV 92   MCH 30.3   MCHC 32.8   RDW 17.2   Neutrophil % 42.5   Lymphocyte  % 42.0   Monocyte % 12.3   Eosinophil % 2.8   Basophil % 0.4   Neutrophil # 2.4   Lymphocyte # 2.3   Monocyte # 0.7   Eosinophil # 0.2   Basophil #  0.0   Result(s) reported on 09 Jul 2012 at 05:16AM.   Assessment & Diagnosis: Axis I: MDD, recurrent , moderate Anxiety DO NOS Alcohol Abuse.   Axis II: none.   Axis III: See PMH.   Axis IV: Medical issues .   Axis V: 45.  Treatment Plan: Patient is aware of and understands the risks and benefits of the proposed treatment or treatment changes..   Discussed with pt about the treatment options, risks, benefits and alternatives.  Advised her about lowering the dose of Seroquel as it increases the risk of Metabolic Syndrome, increase weight, lipase level and increase risk of pancreatitis.  Will start her on Lexapro $RemoveBe'10mg'VqIByoacc$  in the Am to help with her depressive symptoms and discussed with her about the side effects including black box warning of SSRI induced increased suicidal tendencies. Will restart Ambien to help her sleep at night. Pt agreed with treatment plan. She can be discharged once medically stable and follow up with outpt Psychiatry services.  Thank you for allowing me to participate in the care of this pt.  Electronic Signatures: Jeronimo Norma (MD)  (Signed 02-Jan-14 14:39)  Authored: Chief Complaint, Presenting Symptoms, History of Present Illness, Target Symptoms, Past Psychiatric Treatment, Substance Abuse History, PAST MEDICAL & SURGICAL HX, CURRENT OUTPATIENT MEDICATIONS, Family History, Social History, Legal History, Mental Status Exam, Suicide Risk Assessment, Review of Systems, NURSING FLOWSHEETS, LAB, Assessment & Diagnosis, Treatment Plan   Last Updated: 02-Jan-14 14:39 by Jeronimo Norma (MD)

## 2014-10-28 NOTE — H&P (Signed)
PATIENT NAME:  [name], [name] MR#:  [id number] DATE OF BIRTH:  Aug 27, 1955  DATE OF ADMISSION:  08/18/2012  PRIMARY CARE PHYSICIAN:  None.   GASTROENTEROLOGIST:  _____  CHIEF COMPLAINT:  Abdominal pain and cough.   HISTORY OF PRESENT ILLNESS:  The patient is a 59 year old obese African American female with history of chronic pancreatitis, history of chronic alcohol abuse, chronic smoking, hypertension with history of COPD who comes to the Emergency Room accompanied by her husband for "I am just not feeling well." The patient states for the last couple of days she has been coughing up yellowish phlegm with some fever at home along with coarse breath sounds. She also started having abdominal pain in her epigastric and umbilical area. The patient reports drinking beers on a regular basis. Her last beer was 4 beers yesterday evening. She also continues to smoke about a pack a day. She was found to have acute on chronic pancreatitis which is alcohol-induced along with acute bronchitis. She is being admitted for further evaluation and management.   PAST MEDICAL HISTORY:   1.  Chronic pancreatitis, suspected alcohol induced.  2.  Hypertension.  3.  COPD with ongoing tobacco abuse.  4.  Seizure disorder status post craniotomy and resection of brain tumor.  5.  Chronic pain syndrome.  6.  Morbid obesity.  7.  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  8.  History of depression with also history of pain medication-seeking behavior.  9.  History of alcohol and tobacco abuse.  10.  Chronic liver cirrhosis with hepatitis C as well.   MEDICATIONS:   1.  Triamcinolone topical 0.1% topical cream apply to the finger twice a day as needed for eczema.  2.  Spironolactone 150 mg 3 times a day.  3.  Spiriva 18 mcg inhalation daily.  4.  Seroquel 100 mg p.o. daily at bedtime.  5.  Promethazine 25 mg 1 tablet 4 times a day.  6.  Percocet  10/325 mg 1 tablet every 6 hours.  7.  Omeprazole 40 mg b.i.d.  8.  Metoprolol ER 1 tablet daily.  9.  Keppra 500 mg b.i.d.  10.  Lactulose 30 mL b.i.d.  11.  Lexapro 10 mg daily.  12.  Dilantin 125 mg/9 mL b.i.d.  13.  Creon 12,000 units 1 capsule 3 times a day.  14.  Combivent inhaler 1 puff 4 times a day.  15.  Amlodipine 10 mg daily.  16.  Amitriptyline 50 mg daily at bedtime.  17.  Ambien 10 mg at bedtime.  18.  Albuterol ipratropium 3 mL inhalation 4 times a day.  19.  Advair Diskus 250/50 mcg 1 puff b.i.d.   ALLERGIES:  DILAUDID, IBUPROFEN, PENICILLIN, SULFA, CHOCOLATE, STRAWBERRIES AND TOMATOES.   PAST SURGICAL HISTORY:   1.  History of astrocytoma status post craniotomy.  2.  Cholecystectomy.  3.  Appendectomy.  4.  Colostomy for rectovaginal fistula repair.  5.  Left knee surgery.   FAMILY HISTORY:  Father died at 24 from heart attack. Mother is alive and suffers from hypertension.   SOCIAL HISTORY:  Lives at home. The patient smokes a pack a day. She drinks beer every day. Last drink was 4 beers last night.   REVIEW OF SYSTEMS:   CONSTITUTIONAL: Positive for fever, fatigue, weakness.  EYES: No blurred or double vision or glaucoma.  ENT: No tinnitus, ear pain, hearing loss, postnasal drip.  RESPIRATORY: Positive  for cough, wheeze, COPD.  CARDIOVASCULAR: No chest pain, orthopnea or edema.  GASTROINTESTINAL: Positive for abdominal pain and nausea. Positive for GERD. No hematemesis, melena or ulcers.  GENITOURINARY: No dysuria or hematuria.  ENDOCRINE: No polyuria or nocturia or thyroid problems.  HEMATOLOGY: No anemia or easy bruising.  SKIN: No acne or rash.  MUSCULOSKELETAL: Positive for arthritis.  NEUROLOGIC: No CVA or TIA.  PSYCHIATRIC: Positive for depression. All other systems reviewed and negative.   PHYSICAL EXAMINATION:  GENERAL: The patient is awake, alert, oriented x 3.  VITAL SIGNS: Temperature is 99.2, pulse is 127, blood pressure is 115/65 and sats  are 98% on 2 liters.  HEENT: Atraumatic, normocephalic. PERRLA. EOMI. Oral mucosa is dry.  NECK: Supple. No JVD. No carotid bruit.  RESPIRATORY: Clear to auscultation bilaterally. Some coarse breath sounds in the bases. No crackles, respiratory distress or labored breathing.  CARDIOVASCULAR: Tachycardia present. Both heart sounds are normal. Rhythm is regular. Rate is tachycardic. No murmur heard. PMI not lateralized. Chest is nontender.  EXTREMITIES: Good pedal pulses and good femoral pulses. No lower extremity edema.  ABDOMEN: Obese, soft. There is some mild tenderness present in the epigastric and umbilical area. No guarding, rigidity or organomegaly.  NEUROLOGIC: Grossly intact cranial nerves II through XII. No motor or sensory deficits.  PSYCHIATRIC: The patient is awake, alert, oriented x 3.   DIAGNOSTIC DATA:  Chest x-ray shows no acute abnormality. CBC is within normal limits. Glucose is 126, BUN 6, creatinine 0.83, sodium 137, potassium 3.7, chloride 105, bicarbonate is 19. Bilirubin is 1.4, alkaline phosphatase is 259, SGPT 65, SGOT 128, total protein is 10.0. Dilantin is 5.4. Lipase is 1663.   ASSESSMENT AND PLAN:  A 59 year old patient with a history of chronic pancreatitis, ongoing alcohol abuse, history of chronic obstructive pulmonary disease and hypertension comes in with:  1.  Acute on chronic pancreatitis. The patient has history of ongoing alcohol abuse, likely alcohol induced. She is advised on alcohol abstinence. We will admit patient to the medical floor. We will keep her n.p.o. except medications and ice chips. I will continue intravenous fluids, intravenous p.r.n. morphine and p.r.n. Percocet; we will try to alternate. We will follow up lipase levels clinically and have gastroenterology see the patient in consultation. The patient has been seen by [facility] gastroenterology in the past. We will monitor serial lipase and once clinically improved start the patient on a clear  liquid diet at that time.  2.  Acute bronchitis in the setting of chronic obstructive pulmonary disease with ongoing tobacco abuse. I will give the patient a course of Z-Pak given her fever, tachycardia and productive cough. A chest x-ray shows pneumonia. We will send sputum for culture and sensitivity. We will continue nebulizer as needed around the clock along with her oral inhalers.  3.  Chronic tobacco abuse. The patient is advised on smoking cessation. About 3 minutes has been spent on smoking cessation.  4.  Systemic inflammatory response syndrome secondary to acute pancreatitis. I will give supportive care with intravenous fluids and monitor her vitals.  5.  Morbid obesity.  6.  _____ hypertension. I will hold off on blood pressure meds at this time.  7.  History of depression. We will continue her Lexapro and Seroquel as before.  8.  Gastroesophageal reflux disease. We will continue omeprazole.  9.  History of seizure disorder after her craniotomy for astrocytoma removal. She is on Keppra and Dilantin which we will continue.  10.  Deep vein  thrombosis prophylaxis with subcutaneous heparin.  11.  Discharge planning. We will have care management help the patient get established with a primary care physician.  12.  Further workup according to the patient's clinical course. Hospital admission was discussed with the patient. The patient's husband was present in the Emergency Room. The patient is a FULL CODE.   TIME SPENT:  50 minutes.    ____________________________ Hart Rochester Posey Pronto, MD sap:si D: 08/18/2012 15:12:00 ET T: 08/18/2012 16:03:51 ET JOB#: [id number]  cc: Shandrell Boda A. Posey Pronto, MD, <Dictator> Ilda Basset MD ELECTRONICALLY SIGNED 08/18/2012 21:15

## 2014-10-28 NOTE — Consult Note (Signed)
Brief Consult Note: Comments: Psychiatry:Routine Consult received by me yesterday evening at the end of the day, about 4:40pm. I came to see patient today for consult and she has already been discharged.  Electronic Signatures: Gonzella Lex (MD)  (Signed 16-Oct-14 16:09)  Authored: Brief Consult Note   Last Updated: 16-Oct-14 16:09 by Gonzella Lex (MD)

## 2014-10-28 NOTE — H&P (Signed)
PATIENT NAME:  [name], [name] MR#:  [id number] DATE OF BIRTH:  [birth year]  DATE OF ADMISSION:  09/22/2012  PRIMARY CARE PHYSICIAN: None local.   REFERRING PHYSICIAN: Dr. Marjean Donna.   CHIEF COMPLAINT:  Left upper quadrant abdominal pain associated with nausea and vomiting radiating to the left side of the chest.   HISTORY OF PRESENT ILLNESS: The patient is a [name] who is morbidly obese with a past medical history of chronic pancreatitis, COPD, still with ongoing tobacco abuse, chronic pain syndrome with a history of alcohol and tobacco abuse and multiple other medical problems who is presenting to the ER with a chief complaint of left upper quadrant abdominal pain associated with nausea and vomiting for the past 4 days. The patient is reporting that left-sided abdominal pain is radiating to the left side of the chest and also to the back. The pain is constant in nature for the past 4 days associated with constipation, is 7 out of 10 and sharp in nature. She is reporting that her last alcohol intake was 32 days ago. Still continues to smoke.  According to ER physician's note, the patient is complaining of left-sided chest pain associated with shortness of breath and cough. The patient denies any shortness of breath to me during my examination, just complaining of left upper quadrant abdominal pain associated with nausea and vomiting radiating to the left inframammary chest and to the back. She has reported that she had a history of coronary artery disease in the year 2005, just takes nitroglycerin on an as needed basis only. She does not see any cardiologist. Chest x-ray was done in the ER which has revealed no acute findings. CT angiogram of the chest was also done to rule out pulmonary embolism and subsequently it was negative for PE. During my examination, the patient was comfortably sleeping and requesting for more pain medication. Her last bowel movement was yesterday.  Denies any blood in her vomit or stool. No other complaints. Denies any perspiration, diaphoresis or dizziness.   PAST MEDICAL HISTORY: Chronic pancreatitis, probably from alcohol-induced, hypertension, COPD, ongoing tobacco abuse, seizure disorder, status post craniotomy and resection of the brain tumor. chronic pain syndrome, morbid obesity, history of depression with a history of pain medication seeking behavior, chronic liver cirrhosis with hepatitis C, coronary artery disease, chronic alcohol and tobacco abuse, history of astrocytoma per old records for which the patient underwent craniotomy at [facility].   PAST SURGICAL HISTORY: Craniotomy and resection of the brain tumor, cholecystectomy,  appendectomy, colostomy for rectovaginal fistula repair, left knee surgery.   ALLERGIES:  THE PATIENT IS ALLERGIC TERMINAL ILEUM: 1.  DILAUDID. 2.  IBUPROFEN. 3.  PENICILLIN. 4.  SULFA. 5.  CHOCOLATE. 6.  STRAWBERRIES. 7.  TOMATOES.  HOME MEDICATIONS: Spironolactone 50 mg 3 times a day, Spiriva 18 mcg inhalation once daily, quetiapine 100 mg once a day, promethazine 25 mg 4 times a day, prednisone 20 mg once a day, Percocet 10/325 one tablet q.6 hours as needed for pain, omeprazole 40 mg 2 times a day, nicotine  21 mg patch transdermally once a day, metoprolol succinate 1 tablet once a day, Keppra 500 mg 2 times a day, lactulose 30 a.m. 2 times a day, escitalopram 10 mg once daily, Dilantin 125 mg per 5 mL takes 9 mL 2 times a day, Creon 12,000 units 2 capsules 3 times a day, amitriptyline 50 mg once a day, Combivent 1 puff inhalation 4 times a day, Ambien 10  mg once a day, alprazolam 1 mg  every 8 hours, albuterol ipratropium 3 inhalations 4 times a day, Advair 1 puff inhalation 2 times a day.   PSYCHOSOCIAL HISTORY: Lives at home. Smokes 1 pack a day. Stop drinking for the past 32 days. Denies any street drugs.   FAMILY HISTORY: Father deceased at age 27 from heart attack and mother is alive.   She has history of hypertension.    REVIEW OF SYSTEMS:   CONSTITUTIONAL: Denies any fever, fatigue, weakness. Complaining of left upper quadrant abdominal pain. Denies any weight loss or weight gain.  EYES: No blurry vision, glaucoma, cataracts.  EARS, NOSE, THROAT: No tinnitus, ear pain, discharge, snoring, postnasal drip.  RESPIRATIONS: Complaining of cough which is dry in nature. Chronic history of COPD is present. Denies any dyspnea, asthma or hemoptysis.  CARDIOVASCULAR: Complaining of left inframammary chest pain, which is sharp in nature for the past 4 days. Denies any palpitations or syncope.   GASTROINTESTINAL: Complaining of nausea and vomiting, constipation, left upper quadrant abdominal pain. Denies any melena or hematemesis.  GENITOURINARY: No dysuria or hematuria.  GYNECOLOGIC AND BREASTS: No breast mass or vaginal discharge.  ENDOCRINE: Polyuria, nocturia, thyroid problems.  HEMATOLOGIC/LYMPHATIC:  Denies anemia, easy bruising.  INTEGUMENTARY: No acne, rash, lesions.  MUSCULOSKELETAL: No pain in the neck, back or shoulder. Denies any gout. No arthritis.  NEUROLOGIC: No history of vertigo, ataxia, CVA, TIA.  PSYCHIATRIC: Denies ADD, OCD.  Has history of insomnia and takes Ambien.  Has a history  of depression as well.  PHYSICAL EXAMINATION: VITAL SIGNS: Temperature 98.8, pulse 86, respirations 20, blood pressure 96/58, pulse ox 94% on 2 liters.  GENERAL APPEARANCE: Not in acute distress. Morbidly obese lady, well built, well nourished.  HEENT: Normocephalic, atraumatic. Pupils are equally reacting to light and accommodation. No scleral icterus. No conjunctival injection. Extraocular movements are intact. No postnasal drip. No sinus tenderness. Tympanic membranes are intact.  NECK: Supple. No JVD. No thyromegaly. No lymphadenopathy.  LUNGS: Clear to auscultation bilaterally. No wheezing. No accessory muscle usage. No anterior chest wall tenderness on palpation.  CARDIAC: S1, S2  normal. Regular rate and rhythm. No murmurs. No gallops.  GASTROINTESTINAL: Soft, obese. Bowel sounds are positive in all 4 quadrants. Diffuse left upper quadrant tenderness is present. No rebound tenderness. No hepatosplenomegaly. No CVA tenderness on examination.  NEUROLOGIC: Awake, alert, oriented x 3. The patient is lethargic from the pain medication but arousable and answering questions appropriately. Cranial nerves II through XII are grossly intact. Motor and sensory in all 4 extremities are intact. Reflexes are 2+. EXTREMITIES: Trace edema. No cyanosis. No clubbing. Peripheral pulses, dorsalis pedis and posterior tibialis are 2+.  SKIN: Warm to touch. Normal turgor. No lesions. No rashes.  MUSCULOSKELETAL: No joint effusion, tenderness or erythema.  PSYCHIATRIC: Mood and affect.   LABORATORY AND DIAGNOSTIC DATA:  CT angiogram of the chest has revealed a limited examination without evidence of central pulmonary emboli.  Chest x-ray:  No acute abnormalities.  A 12-lead EKG: Sinus tachycardia at 106 beats per minute, normal PR interval at 154, normal QT and corrected QT intervals, nonspecific ST-T wave changes.  Sodium 142, potassium 3.0, chloride 110, CO2 24, anion gap is 8. GFR greater than 60, osmolality 283, calcium 8.5. lipase 369, which is normal. Total CK is 161, CPK-MB 0.8, troponin less than 0.02. WBC 9.1, hemoglobin 10.9, hematocrit 33, platelet count 169, MCV 93.   ASSESSMENT AND PLAN: This is a 59 year old morbidly obese female presenting to  the emergency room with a chief complaint of left upper quadrant abdominal pain for the past 4 days radiating to the left inframammary area and back, associated with nausea and vomiting.  She will be admitted with the following assessment and plan:   1.  Acute left upper quadrant abdominal pain with nausea and vomiting but likely is normal.  Etiology is unclear.  The patient has history of chronic pancreatitis and chronic pain syndrome.  Plan is to  keep her n.p.o. except medications.  Intravenous fluids will be provided with D5 normal saline with 20 KCl, Protonix 40 mg intravenous q.a.m.  We will check a.m. labs including liver function tests, amylase and lipase. We will provide her morphine intravenous as needed for chest pain and abdominal pain. We hold it for sedation and hypotension.  2.  Atypical chest pain, probably left upper quadrant abdominal pain is radiating to the chest, but given the history of coronary artery disease in the past, we will cycle cardiac biomarkers. The patient will be on oxygen and nitroglycerin. The patient being allergic to non-steroidal anti-inflammatories, we will hold off on the aspirin.  The patient being hypotension, I cannot give her any beta blocker. Will check her fasting lipid panel. The patient being n.p.o., we will hold off on the statin.  3.  Chronic history of chronic obstructive pulmonary disease, which is stable in nature. Will continue her albuterol and Advair which are her home medications.  4.  Chronic pancreatitis. We will hold off on home medications, Creon at this time, the patient being n.p.o.  5.  History of chronic pain syndrome. Will provide her morphine as needed basis to avoid narcotic withdrawal syndrome.  6.  We will provide her gastrointestinal prophylaxis with Protonix and deep vein thrombosis prophylaxis with Lovenox subcutaneous.   The diagnosis and plan of care was discussed with the patient. She is full code.   Total time spent on admission: 50 minutes.    ____________________________ Nicholes Mango, MD ag:ct D: 09/22/2012 05:49:53 ET T: 09/22/2012 09:21:18 ET JOB#: [id number]  cc: Nicholes Mango, MD, <Dictator> Nicholes Mango MD ELECTRONICALLY SIGNED 09/26/2012 1:26

## 2014-10-28 NOTE — H&P (Signed)
PATIENT NAME:  [name], [name] MR#:  [id number] DATE OF BIRTH:  Dec 16, 1955  DATE OF ADMISSION:  06/07/2013  PRIMARY CARE PHYSICIAN: Dr. Jacqualine Code.   CHIEF COMPLAINT: Not feeling well with shortness of breath, abdominal pain, vomiting.   HISTORY OF PRESENTING ILLNESS: This 59 year old, morbidly obese, African American female patient with history of COPD, depression, chronic abdominal pain, history of craniotomy for meningioma presents to the hospital with complaints of abdominal pain, vomiting, not feeling well and shortness of breath. The patient was last seen in the hospital in October when she was treated for septic shock and bilateral pneumonia. The patient seems to have done well but returns to the Emergency Room with abdominal pain, vomiting, shortness of breath. The patient has had multiple ER visits and admissions for the same complaint of abdominal pain, nausea, vomiting secondary to chronic pancreatitis.   Today in the Emergency Room, the patient has received initially 4 mg of morphine IM, followed by 6 mg of morphine IM, followed by Benadryl, followed by Phenergan. After these, with improved pain, the patient tried to go to the bathroom where she had low blood pressure in the 80s, presyncope, diaphoresis and some altered mental status. The patient seems to have improved with naloxone but continues to have low blood pressure, drowsiness and is being admitted to the hospitalist service. Chest x-ray showed some atelectasis. This was followed by a CT of the chest to rule out pneumonia and shows atelectasis. No pneumonia, congestive heart failure, edema or effusion.   REVIEW OF SYSTEMS: Unobtainable secondary to the patient's drowsiness.   PAST MEDICAL HISTORY:  1. Chronic pancreatitis.  2. Chronic pain syndrome.  3. Hypertension.  4. COPD.   5. Seizure disorder.  6. Craniotomy for meningioma.  7. Obesity.  8. Narcotic drug-seeking behavior.  9. Hepatitis C.  10. Cirrhosis.  11. CAD.   12.  Tobacco abuse.   SOCIAL HISTORY: Smokes 1/2 pack a day. Remote history of alcohol abuse.   FAMILY HISTORY: Hypertension, hyperlipidemia.   ALLERGIES: DILAUDID, IBUPROFEN, PENICILLIN, SULFA, CHOCOLATE, STRAWBERRIES AND TOMATOES.   HOME MEDICATIONS:  1. Alprazolam 1 mg oral every 8 hours as needed for anxiety.  2. Ambien 5 mg daily at bedtime.  3. Amitriptyline 50 mg oral daily.  4. Azithromycin 500 mg daily.  5. Benzonatate 100 mg oral every 4 hours as needed.  6. Ceftin 500 mg oral 2 times a day.  7. Creon 12,000 units 2 capsules oral 3 times a day.  8. Dilantin 225 mg 3 times a day.  9. Fluticasone salmeterol 250/50 one puff inhaled 2 times a day.  10. Keppra 500 mg oral 2 times a day.  11. Lexapro 10 mg daily.  12. Metoprolol tartrate 25 mg oral once a day.  13. Omeprazole 40 mg once a day.  14. Percocet 10/325 two tablets every 6 hours as needed for pain.  15. MiraLAX 17 g oral 2 times a day.  16. Seroquel 100 mg 3.5 tablets oral once a day.  17. ProAir HFA 2 puffs inhaled 4 times a day as needed.  18. Spiriva 18 mcg inhaled 2 times a day.  19. Spironolactone 50 mg 1/2 tablet oral 3 times a day.  20. VESIcare 10 mg daily.   PHYSICAL EXAMINATION:  VITAL SIGNS: Show temperature 98, pulse 89. Initially, blood pressure was 105/60; later, it was 80/52. Saturating 92% on 2 liters oxygen.  GENERAL: Morbidly obese African American female patient lying in bed with increased work of breathing.  PSYCHIATRIC:  Drowsy, opens eyes on calling name.  HEENT: Atraumatic, normocephalic. Oral mucosa moist and pink. External ears and nose normal.  NECK: Supple. No thyromegaly. No palpable lymph nodes. Trachea midline. No carotid bruit or JVD.  CARDIOVASCULAR: S1, S2, without any murmurs. Peripheral pulses 2+. No edema.  RESPIRATORY: Mild increased work of breathing. Bilateral wheezing. Good air entry.  GASTROINTESTINAL: Soft abdomen. Tenderness diffusely. No  guarding. Bowel sounds present. No  hepatosplenomegaly palpable.  GENITOURINARY: No CVA tenderness or bladder distention.  SKIN: Warm and dry. No petechiae, rash, ulcers.  MUSCULOSKELETAL: No joint swelling, redness, effusion of the large joints. Normal muscle tone.  NEUROLOGICAL: Moves all 4 extremities symmetrically. Babinskis downgoing. Reflexes 2+.  LYMPHATIC: No cervical lymphadenopathy.   LABORATORY STUDIES: Show:  CT scan of the chest showed some scarring. No infiltrate.   CT of the head shows postop changes from prior craniotomy. No acute stroke, mass or bleed.   WBC 7.8, hemoglobin 10.1, platelets of 126.   ABG shows pH of 7.42 with pCO2 of 40, pO2 of 69 on 4 liters nasal cannula.   Glucose 220, BUN 8, creatinine 0.94, sodium 136, potassium 4. GFR greater than 60. Troponin less than 0.02.   EKG shows normal sinus rhythm, prolonged QT.   ASSESSMENT AND PLAN:  1. Presyncope, hypotension and acute encephalopathy, likely secondary from the large dose of morphine given in the Emergency Room. The patient has received a total of 10 mg of intramuscular morphine and symptoms seem to have improved with naloxone. Will admit the patient for monitoring on the telemetry floor. Naloxone p.r.n., telemetry. Fall precautions. Intravenous fluids.  2. Chronic obstructive pulmonary disease exacerbation, mild wheezing. No pneumonia on x-ray. Will start on oral steroids and nebulizers p.r.n. along with home inhalers.  3. Chronic pain syndrome: The patient does have narcotic medication-seeking behavior reviewing old records. She has asked multiple times for more pain medication in the Emergency Room. I have told her that we would be unable to give her any pain medications secondary to hypotension, and she has verbalized understanding. Will continue her home Percocet.  4. History of seizures: Continue medications.  5. Anemia of chronic disease, stable.  6. Hypertension: Will hold her blood pressure medications as she was hypotensive in the  Emergency Room.  7. Deep venous thrombosis prophylaxis with heparin.   CODE STATUS: FULL CODE.   TIME SPENT TODAY ON THIS CASE: 50 minutes.    ____________________________ Leia Alf Elonna Mcfarlane, MD srs:gb D: 06/07/2013 04:21:33 ET T: 06/07/2013 04:39:12 ET JOB#: [id number]  cc: Alveta Heimlich R. Antwion Carpenter, MD, <Dictator> Milinda Pointer. Jacqualine Code, MD  Alveta Heimlich Arlice Colt MD ELECTRONICALLY SIGNED 06/10/2013 4:37

## 2014-10-28 NOTE — Discharge Summary (Signed)
PATIENT NAME:  [name], [name] MR#:  [id number] DATE OF BIRTH:  06/17/1956  DATE OF ADMISSION:  07/06/2012 DATE OF DISCHARGE:  07/14/2012  For history and physical, and previous discharge summary, please see the dictation on 07/06/2012, in regards to admission and 07/12/2012, for previous interim discharge summary.   HOSPITAL COURSE since July 12, 2012: The patient had uneventful hospitalization the last couple of days. The patient although had abdominal pain and had been getting improved. The patient's diet was bumped to a soft diet on the 6th, which the patient did tolerate. The lipase has trended down. The last lipase is 508 from 663 down from 1370 from January 4. The patient has tolerated the diet. There has been no significant nausea, vomiting, or diarrhea. The patient does have pain medication seeking behavior of note. Today, on the day of discharge, the patient was ambulating, tolerating the GI soft, and asked to be discharged.   DISCHARGE DIAGNOSES:  1. Acute on chronic pancreatitis.  2.  Hypertension.  3.  Chronic obstructive pulmonary disease.  4.  Seizure disorder.  5.  Chronic pain syndrome.  6.  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  7.  History of depression with pain medication seeking behavior.  8.  History of stroke with minimal left-sided weakness.  9.  History of alcohol abuse and tobacco abuse.    DISCHARGE MEDICATIONS:  1.  Spironolactone 50 mg 1 tab t.i.d. 2.  Percocet 10/325 one tab every six hours as needed for pain.  3.  Dilantin 125/32mL suspension 9 mL 2 times a day.  4.  Keppra 500 mg 2 times a day.  5.  Amitriptyline 50 mg 1 tablet at bedtime.  6.  Metoprolol succinate 25 mg extended-release 1 tab once a day.  7.  Advair 250/50 mcg inhaled 1 puff 2 times a day.  8.  Spiriva 18 mcg inhaled once a day.  9.  Amlodipine 10 mg daily.  10.  Creon 12,000 units oral delayed release capsule one cap 3  times a day.  11.  Lactulose 10 grams/15 mL oral syrup 30 mL 2 times a day.  12.  Omeprazole 40 mg 2 times a day.  13.  Citalopram 10 mg once a day.  14.  Quetiapine 100 mg at bedtime.  15.  Albuterol/ipratropium 2 puffs every four hours.   DIET: Low sodium, low fat, low cholesterol, GI soft diet.   ACTIVITY: As tolerated.   FOLLOWUP: Please follow with the primary care physician within 1 to 2 weeks, as well as your gastroenterologist within 1 to 2 weeks. Please follow with your pain specialist within 1 to 2 weeks.   DISPOSITION: Home.   CODE STATUS: The patient is full code.   Total Time Spent: Forty minutes.    ____________________________ Vivien Presto, MD sa:th D: 07/14/2012 18:20:43 ET T: 07/14/2012 22:58:20 ET JOB#: [id number]  cc: Vivien Presto, MD, <Dictator> Vivien Presto MD ELECTRONICALLY SIGNED 07/25/2012 17:28

## 2014-10-28 NOTE — Consult Note (Signed)
Pt requests Elavil but there is interaction with Lexapro. I will leave this question with the Hospitalist.  Electronic Signatures: Manya Silvas (MD)  (Signed on 05-Jan-14 10:37)  Authored  Last Updated: 05-Jan-14 10:37 by Manya Silvas (MD)

## 2014-10-28 NOTE — H&P (Signed)
PATIENT NAME:  [name], [name] MR#:  [id number] DATE OF BIRTH:  [birth year]  DATE OF ADMISSION:  04/19/2013  REFERRING PHYSICIAN: Dr. Owens Shark.   PRIMARY CARE PHYSICIAN: Dr. Randel Books.  CHIEF COMPLAINT: Abdominal pain.  HISTORY OF PRESENT ILLNESS: Ms. Pankowski is a 59 year old, African American female with past medical history of hypertension, chronic obstructive pulmonary disease, non-O2 dependent, depression, seizure disorder, history of craniotomy for meningioma, chronic pancreatitis, who is presenting with a 3-day duration of abdominal pain. She describes this as suprapubic pain radiating to the bilateral flank, sharp, 8 to 10 out of 10 in intensity, constant with associated subjective fevers and chills. She denies any worsening or relieving factors. She also notes a cough productive of, what she describes as, meat for 1 to 2 months in total as well as associated shortness of breath that has been worsening; however, she is unable to further delineate this as she denies any dyspnea on exertion or orthopnea or lower extremity edema, chest pain or palpitations. She was recently evaluated at [facility] on 04/16/2013 for similar symptoms of abdominal pain and discharged from the Emergency Department, diagnosed with a urinary tract infection. Her results returned positive for UTI of E. coli greater than 100,000 colony-forming units, pansensitive. They called  antibiotics to her pharmacy; however, she was unable to obtain those antibiotics and she states her pharmacy was closed. On arrival to [facility], she was found to be hypotensive with blood pressure 70s over 40s requiring dopamine to maintain mean arterial pressure greater than 65. After receiving 3 L bolus of normal saline she became hypoxemic, requiring supplemental O2. Of note, there was positive TCA on her urine drug screen. She states that she does not know if she took extra doses, but thinks it is unlikely. Currently, complaining of abdominal pain and back  pain.   REVIEW OF SYSTEMS: CONSTITUTIONAL: Positive for subjective fevers, chills, pain as above. Denies any weakness or weight changes. EYES: Denies blurred vision, double vision or eye pain. ENT: Denies ear pain, hearing loss, post nasal drip or dysphagia. RESPIRATORY: Positive for cough as above. Denies wheeze or painful respirations. CARDIOVASCULAR: Denies chest pain, palpitations or edema. GASTROINTESTINAL: Denies nausea, vomiting or diarrhea. Positive for abdominal pain as above. GENITOURINARY: Denies dysuria or hematuria. ENDOCRINE: Denies nocturia or thyroid problems. HEMATOLOGIC AND LYMPHATIC: Denies easy bruising or bleeding. SKIN: Denies rashes or lesions.  MUSCULOSKELETAL: Denies pain in neck, back, shoulder, knee or hip arthritic symptoms or joint swelling. MUSCULOSKELETAL: Denies any paralysis or paresthesias. PSYCHIATRIC: Denies any current anxiety or depressive symptoms. Otherwise, full review of systems performed by me is negative.   PAST MEDICAL HISTORY: Chronic pancreatitis, hypertension, COPD, seizure disorder, craniotomy for meningioma, chronic pain, obesity, drug seeking behavior, history of hepatitis C, cirrhosis and coronary artery disease.   SOCIAL HISTORY: Current every day smoker of 1/2 pack daily. Denies any current alcohol use. She has a remote history of alcohol abuse, though no drinking greater than 1 year.   FAMILY HISTORY: Positive for hypertension and hyperlipidemia. Denies any pulmonary disorders.   ALLERGIES: DILAUDID, IBUPROFEN, PENICILLIN, SULFA DRUGS, CHOCOLATE, STRAWBERRIES AND TOMATO.  HOME MEDICATIONS:  Advair 250/50 mcg 1 puff inhalation b.i.d., albuterol 2.5 mg 3 mL inhalation 4 times daily as needed for shortness of breath, Ambien 10 mg p.o. at bedtime as needed for insomnia, amitriptyline 50 mg p.o. at bedtime, Norvasc 10 mg p.o. daily, Creon 12,000 units p.o. 2 tabs t.i.d., Dilantin 25 mg/mL 9 mL suspension t.i.d., Keppra 500 mg p.o. b.i.d., Lexapro 10  mg  p.o. daily, metoprolol extended release 25 mg p.o. daily, Prilosec 40 mg p.o. daily, Pro-Air 90 mcg inhalation 2 puffs 4 times daily as needed for shortness of breath, quetiapine 100 mg 3.5 tabs p.o. at bedtime, Spiriva 18 mg p.o. b.i.d., spironolactone 50 mg p.o. t.i.d. and VESIcare 10 mg p.o. daily.   PHYSICAL EXAMINATION:  VITAL SIGNS: Temperature 98.9, heart rate 88, respirations 20, blood pressure 86/53, saturating 94% on room air, weight 132 kg, BMI of 44.3.  GENERAL: Well-nourished, obese, African American female in moderate distress secondary to abdominal pain and respiratory status.  HEAD: Normocephalic, atraumatic. Eyes: Pupils equal, round, reactive to light. Extraocular muscles intact. No scleral icterus. Mouth: Poor dentition. Moist mucosal membranes. No abscess noted. Ears, nose and throat: Clear without exudate. No external lesions.  NECK: Supple. No thyromegaly. No nodules. No JVD.  PULMONARY: Clear to auscultation bilaterally. No wheezes, rubs or rhonchi. No use of accessory muscles. Good respiratory effort.  CHEST: Nontender to palpation.  CARDIOVASCULAR: S1, S2, regular rate and rhythm. No murmurs, rubs, or gallops. No peripheral edema. Pedal pulses 2+.  GASTROINTESTINAL: Soft. Mild tenderness to palpation over suprapubic region without rebound or guarding. No masses. No hepatosplenomegaly. Positive bowel sounds. Positive CVA tenderness.  MUSCULOSKELETAL: No swelling, clubbing, edema. Full range of motion in all extremities.  NEUROLOGIC: Cranial nerves II through XII intact. No gross neurological deficits. Sensation and reflexes intact.  SKIN: No ulcerations, lesions, rashes or cyanosis. Skin is warm and dry, turgor intact.  PSYCHIATRIC: Mood and affect are within normal limits. She is awake, alert, and oriented x 3. Insight and judgment intact.   LABORATORY DATA: EKG normal sinus rhythm, heart rate of 96, prolonged QT with QTc of 530, otherwise no ST or T abnormalities. Chest  x-ray: Sallow lung volumes. The base is obscured secondary to body habitus, Minimal bibasilar atelectasis present. Sodium 138, potassium 3.4, chloride 105, bicarbonate 23, BUN 5, creatinine 1.01, glucose 177, lipase 216. Total protein 7.7, albumin 2.9, bilirubin 0.6, alkaline phosphatase 149, AST 53, ALT 38. Urine drug screen positive for TCA. WBC 8, hemoglobin 10.3, hematocrit 31.9, platelets of 143. Urinalysis with trace leukocyte esterase, nitrite negative. WBC of 4, RBC of 18.   ASSESSMENT AND PLAN: A [name] with past medical history of hypertension, chronic obstructive pulmonary disease, depression, seizures, craniotomy for meningioma, chronic pain treated, presenting for a 3 day duration of abdominal pain. She was recently evaluated at [facility] for similar symptoms on 04/16/2013 and  discharged with urinary tract infection,  which is Escherichia coli pansensitive. She was; however,  unable to receive antibiotic therapy as she states her pharmacy was closed. On arrival to the Emergency Department, she was found to be hypotensive, 70s over 40s, requiring dopamine to maintain blood pressure as well as normal saline boluses.  1.  Refractory hypotension and septic shock. Panculture including blood and urine. Broad-spectrum antibiotics of vancomycin and cefepime. Cautious intravenous fluids. She has received 3 L of normal saline and is now hypoxemic on dopamine drip at 20. We will add Levophed if necessary to keep MAP greater than 65.If possible would perfer levophed over dopamine and will titrate medications accordingly. Discussed with surgery, Dr. Leanora Cover for central line placement. 2.  Hypoxemic respiratory failure. Supplemental O2 to keep oxygen saturation greater than 92%. DuoNeb therapy q.4 hours. We will check an ABG now.  3.  Hyperglycemia.  q.6 hour Accu-Cheks with insulin sliding scale with a goal blood glucose between 120 and 180 in critical care  setting. 4.  Seizures. We  are continuing Keppra and Dilantin.  5.  Chronic obstructive pulmonary disease. Continue with Advair.  6.  Hypertension. Hold all medications given hypotension.  7.  Venous thromboembolism with heparin subcutaneous.   CODE STATUS: The patient is full code.   CRITICAL CARE TIME SPENT: 55 minutes.    ____________________________ Aaron Mose. Hower, MD dkh:aw D: 04/19/2013 03:39:15 ET T: 04/19/2013 06:03:39 ET JOB#: [id number]  cc: Aaron Mose. Hower, MD, <Dictator> DAVID Woodfin Ganja MD ELECTRONICALLY SIGNED 04/20/2013 2:38

## 2014-10-28 NOTE — Consult Note (Signed)
Chief Complaint:  Subjective/Chief Complaint seen for hematemesis, deneis further emesis, has some mild nausea but would like to advance diet.  continues with c/o lower abdominal pain   VITAL SIGNS/ANCILLARY NOTES: **Vital Signs.:   04-Dec-14 08:48  Temperature Temperature (F) 98.2  Celsius 36.7  Pulse Pulse 97  Respirations Respirations 15  Systolic BP Systolic BP 284  Diastolic BP (mmHg) Diastolic BP (mmHg) 74  Mean BP 86  Pulse Ox % Pulse Ox % 91  Pulse Ox Activity Level  At rest  Oxygen Delivery Room Air/ 21 %  *Intake and Output.:   04-Dec-14 00:49  Urinary Method  Up to BR  Unmeasured Output  Void    01:09  Urinary Method  Up to BR  Unmeasured Output  Void    02:08  Urinary Method  Up to BR  Unmeasured Output  Void    07:40  Unmeasured Intake  no tray in room   Brief Assessment:  Cardiac Regular   Respiratory clear BS   Gastrointestinal details normal Soft  Nondistended  Bowel sounds normal  No rebound tenderness  patient  grimaces with even light touch to lower abd, laterally toward the flanks.   Lab Results: Routine Chem:  04-Dec-14 05:17   Glucose, Serum  150  BUN 12  Creatinine (comp) 1.14  Sodium, Serum  133  Potassium, Serum 4.0  Chloride, Serum 101  CO2, Serum 27  Calcium (Total), Serum 8.9  Anion Gap  5  Osmolality (calc) 269  eGFR (African American) >60  eGFR (Non-African American)  54 (eGFR values <71m/min/1.73 m2 may be an indication of chronic kidney disease (CKD). Calculated eGFR is useful in patients with stable renal function. The eGFR calculation will not be reliable in acutely ill patients when serum creatinine is changing rapidly. It is not useful in  patients on dialysis. The eGFR calculation may not be applicable to patients at the low and high extremes of body sizes, pregnant women, and vegetarians.)  Routine Hem:  30-Nov-14 21:37   Hemoglobin (CBC)  10.1  02-Dec-14 09:44   Hemoglobin (CBC)  10.0    22:27   Hemoglobin (CBC)   9.7   Radiology Results: CT:    03-Dec-14 19:12, CT Abdomen and Pelvis With Contrast  CT Abdomen and Pelvis With Contrast   REASON FOR EXAM:    (1) pain and vomiting blood; (2) pain- hx of   recto-vaginal fistuls sx.  COMMENTS:       PROCEDURE: CT  - CT ABDOMEN / PELVIS  W  - Jun 09 2013  7:12PM     CLINICAL DATA:  Epigastric tightness. Dark colored stools and  weakness for 3 weeks.    EXAM:  CT ABDOMEN AND PELVIS WITH CONTRAST    TECHNIQUE:  Multidetector CT imaging of the abdomen and pelvis was performed  using the standard protocol following bolus administration of  intravenous contrast.    CONTRAST:  125 cc of Isovue 370.    COMPARISON:  12/23/2012    FINDINGS:  There is atelectasis versus scar noted in the right base. No pleural  or pericardial effusion.    No suspicious liver stress set no focal liver abnormality. The  contour of the liver is irregular and nodular. The patient is status  post cholecystectomy. No biliary dilatation. The pancreas appears  normal. The spleen is unremarkable. The spleen measures 11 cm in  cranial caudal dimension.  The right adrenal gland appears normal. Nodule in the left adrenal  gland measure  1.4 cm and is unchanged from previous study, image  22/series 2.    Normal appearance of both kidneys. The urinary bladder appears  normal. There is calcified atherosclerotic disease affecting the  abdominal aorta. No aneurysm. No upper abdominal adenopathy  identified. There is no pelvic or inguinal adenopathy identified. No  free fluid or abnormal fluid collections identified.    The stomach appears normal. The small bowel loops have a normal  course and caliber without obstruction. Normal appearance of the  colon.    Postoperative changes involving the distal sigmoid colon/rectum  noted. Review of the visualized bony structures is on unremarkable.     IMPRESSION:  1. No acute findings within the abdomen or pelvis.  2. Morphologic  features of the liver compatible with cirrhosis.  3. Prior cholecystectomy.  4. Stable left adrenal nodule.      Electronically Signed    By: Kerby Moors M.D.    On: 06/09/2013 19:23         Verified By: Angelita Ingles, M.D.,   Assessment/Plan:  Assessment/Plan:  Assessment 1) hematemesis-egd attempted but aborted due to hypoxia.  I feel in light of copd, ongoing smoking and recent pna, she is high risk to repeat attempt as this would likely require general anesthesia and intubation.  She has had no further emesis, on ppi bid.  2) cirrhosis in the setting of hepatitis C, previous etoh and substance abuse.   3) lower abdominal pain/chronic discomfort-CT negative, woudl consider possible radiculopathy a/w possible spondylolithesis/degenerative disc/vertebral disease in the settign of morbid obesity 4) history of pain medication seeking behavior.   Plan 1) continue bid ppi as outpatient.   2) consider plain films t+l spine no plans for further luminal evaluation at this time   Electronic Signatures: Loistine Simas (MD)  (Signed 04-Dec-14 11:23)  Authored: Chief Complaint, VITAL SIGNS/ANCILLARY NOTES, Brief Assessment, Lab Results, Radiology Results, Assessment/Plan   Last Updated: 04-Dec-14 11:23 by Loistine Simas (MD)

## 2014-10-28 NOTE — H&P (Signed)
PATIENT NAME:  [name], [name] MR#:  [id number] DATE OF BIRTH:  November 04, 1955  DATE OF ADMISSION:  11/25/2012  PRIMARY CARE PHYSICIAN: Anderson Malta A. Gilford Rile, MD  REFERRING PHYSICIAN: Loney Hering, MD  CHIEF COMPLAINT: Multiple complaints, nausea, vomiting, back pain, syncope.   HISTORY OF PRESENT ILLNESS: Ms. Belfield is a 59 year old morbidly obese African-American female with multiple admissions for various complaints, predominantly with abdominal pain, nausea, vomiting as well as chest pain. The patient had extensive workup done with CT abdomen and pelvis, CT head and a stress test, without any obvious abnormality. The patient states has been experiencing pain in the back for the last 1 week. Also has been experiencing dysuria. States has been having nausea, vomiting, unable to keep down any food for the last 2 days. The patient has been in the Emergency Department for the last 7 hours, did not have any episodes of vomiting. The patient also states that has been passing out 2 to 3 times a day. The patient states that these episodes come without any warning signs, falls down to the floor, wakes up when someone calls her. Denies having any headache; however, the patient has multiple vague complaints, stating that has chest pain, shortness of breath, palpitations. The patient is also on multiple sedative medications. The patient has a history of chronic pain syndrome. Workup in the Emergency Department: Urinalysis is negative; even though has 2+ leukocyte esterase, has multiple epithelial cells. No elevated white blood cell count. The patient is afebrile. Orthostatic blood pressures were checked in the Emergency Department, which were negative. No obvious weakness or numbness in any part of the body.   PAST MEDICAL HISTORY:  1. Chronic pancreatitis, alcohol induced.  2. Hypertension.  3. COPD.  4. Continued tobacco use.  5. Seizure disorder.  6. Status post craniotomy and resection of the brain  tumor. 7. Chronic pain syndrome.  8. Morbid obesity.  9. Depression.  10. Narcotic medication-seeking behavior.  11. Cirrhosis.  12. Hepatitis C.  13. Coronary artery disease, recent stress test done in 2012 which was negative.   PAST SURGICAL HISTORY:  1. Craniotomy and resection of a brain tumor.  2. Cholecystectomy.  3. Appendectomy.  4. Colostomy for rectovaginal fistula repair. 5. Left knee surgery.   ALLERGIES:  1. DILAUDID.  2. IBUPROFEN.  3. PENICILLIN. 4. SULFA. 5. CHOCOLATE. 6. STRAWBERRIES. 7. TOMATOES.   HOME MEDICATIONS:  1. Xanax 1 tablet 2 times a day.  2. Spironolactone 1 tablet 3 times a day.  3. Spiriva 18 mcg inhaled daily.  4. Quetiapine 100 mg in the morning, 100 mg at bedtime.  5. Omeprazole 1 capsule 2 times a day.  6. Toprol-XL 25 mg once a day.  7. Keppra 1000 mg b.i.d.  8. Creon 12,000 units 2 capsules 3 times a day.  9. Combivent 1 puff 4 times a day.  10. Amlodipine 10 mg once a day.  11. Amitriptyline 50 mg once a day.  12. Ambien 10 mg once a day.  13. Combivent inhalations 4 times a day.  14. Advair Diskus 1 puff 2 times a day.   SOCIAL HISTORY: Continues to smoke less than a pack a day. Drank alcohol heavily in the past. The patient states it has been 120 days since she has not drunk alcohol. Married, lives with her husband.   FAMILY HISTORY: Father died at the age of 33 from heart attack. Mother is alive, history of hypertension.   REVIEW OF SYSTEMS:  CONSTITUTIONAL: Generalized weakness, fatigue.  EYES: No change in vision.  ENT: Has postnasal drip, sore throat.  RESPIRATORY: Has cough, shortness of breath.  CARDIOVASCULAR: Chest pain, palpitations, pedal edema.  GASTROINTESTINAL: Nausea, vomiting, constipation, abdominal pain.  GENITOURINARY: Dysuria. No hematuria.  ENDOCRINE: No polyuria or polydipsia.  HEMATOLOGIC: No easy bruising or bleeding.  SKIN: No rash or lesions.  MUSCULOSKELETAL: Has pain in multiple joints.   NEUROLOGIC: No weakness or numbness.   PHYSICAL EXAMINATION:  GENERAL: This is a morbidly obese female lying down in the bed, not in distress.  VITAL SIGNS: Temperature 98.3, pulse 88, blood pressure 107/66, respiratory rate of 18, oxygen saturation is 95% on room air.  HEENT: Head normocephalic, atraumatic. Eyes: No sclerae icterus. Conjunctivae normal. Pupils equal and reactive to light. Extraocular movements are intact. Mucous membranes: Mild dryness. No pharyngeal erythema.  NECK: Supple. No lymphadenopathy. No JVD. No carotid bruit. CHEST: Has no focal tenderness.  LUNGS: Bilateral occasional wheezing. Coarse breath sounds.  HEART: S1, S2 regular. No murmurs are heard.  LOWER EXTREMITIES: No pedal edema. Pulses 2+.  ABDOMEN: Obese. Bowel sounds present. Soft. The patient states pain all over the abdomen; however, no rebound or guarding. Has tenderness at bilateral costophrenic area; however, just with palpation.  MUSCULOSKELETAL: Good range of motion.  SKIN: Has a small laceration on the right knee, the patient states from the fall.  LYMPHATIC: No axillary or inguinal lymphadenopathy.  NEUROLOGIC: The patient is alert, oriented to place, person and time. Cranial nerves II through XII intact. No motor and sensory deficits.   LABORATORY DATA: CT head without contrast: No acute intracranial abnormality. CBC is completely within normal limits. UA: 2+ leukocyte esterase, WBC 2, bacteria 1+. CMP: Except for potassium of 3.4, the rest of all the values are within normal limits.   ELECTROCARDIOGRAM, 12-LEAD: Normal sinus rhythm. There are no ST-T wave abnormalities.   ASSESSMENT AND PLAN: Ms. Atkin is a 59 year old morbidly obese female who comes with multiple vague complaints.  1. Syncope. Very highly concerning about the patient being on multiple sedating medications. The patient's skin is somewhat dry, even though the patient's orthostatic blood pressures are negative. The patient denies  having seizure-like episodes and arousable when someone calls her. Considering the patient's history of heavy tobacco use, morbid obesity, hypertension, admit the patient to telemetry under observation. Cycle cardiac enzymes x3. If negative, will obtain echocardiogram, carotid Dopplers. If workup is negative, the patient could be discharged home, minimizing the sedative medications.  2. Nausea and vomiting. The patient did not have any episodes of vomiting since she came to the Emergency Department. Keep the patient on a clear liquid diet.  3. Hypertension, currently well controlled.  4. Urinary tract infection. The patient does not have any fever. No elevated white blood cell count. However, will get the urine cultures. If cultures are positive, will consider starting the patient on antibiotics.  5. Morbid obesity. Counseled with the patient. The patient seems to have very poor understanding.  6. History of tobacco use, quite poor insight.  7. Keep the patient on deep vein thrombosis prophylaxis with Lovenox.  Time spent 45 min.  ____________________________ Monica Becton, MD pv:OSi D: 11/25/2012 08:23:59 ET T: 11/25/2012 08:50:48 ET JOB#: [id number]  cc: Monica Becton, MD, <Dictator> Monica Becton MD ELECTRONICALLY SIGNED 12/04/2012 7:31

## 2014-10-28 NOTE — Consult Note (Signed)
PATIENT NAME:  [name], [name] MR#:  [id number] DATE OF BIRTH:  Jul 29, 1955  DATE OF CONSULTATION:  06/08/2013  REFERRING PHYSICIAN:  Dr. Anselm Jungling.  CONSULTING PHYSICIAN:  Lollie Sails, MD  REASON FOR CONSULTATION: Hematemesis.   HISTORY OF PRESENT ILLNESS: Ms. [name] is a [name] who has a history of multiple medical problems, including COPD, recurrent pneumonia, chronic abdominal pain, craniotomy for meningioma. The patient is also morbidly obese. The patient came to the Emergency Room Sunday evening and was admitted early in the morning yesterday. She had a problem with presyncope occurring in the ER, likely secondary to some morphine overdose, this being iatrogenic. She also was admitted with chronic obstructive pulmonary disease exacerbation with some wheezing. Apparently, while she was in the hospital here, she had an episode of throwing up and with this came what she describes as about a teaspoon of bright red blood. This happened yesterday and then again today, apparently 1 episode each time. She states she has been having some nausea and emesis after eating for about a week. She complains of some pain around her umbilicus. She states she has problems with variable bowel habits with mostly constipation. She takes MiraLAX at home p.r.n. There is no heartburn or dysphagia. She has had no recurrent hematemesis over the course of the day and has been hemodynamically stable. She further reports seeing some occasional blood, this being bright red to black, in her stools intermittently for quite some time. She does take omeprazole 40 mg a day as an outpatient. She has a history of gastritis in the past but takes the omeprazole for problems with gastroesophageal reflux, this for well over 10 years. She relates having a pain in the bilateral lower abdomen. I am unsure from conversation with her whether this could be related with her problems with constipation or not. She has a  history of narcotic-seeking behavior. She does, however, also have a history of a rectovaginal fistula approximately 3 years ago. Apparently, she underwent a colostomy with reversal. She was treated for pneumonia approximately a month and a half ago, exacerbated by her problems with COPD. Evaluations via CT scan as well as chest x-ray on this admission are negative for pneumonia, positive for atelectasis and there is a finding of evidence of cirrhosis of the liver on a partial view of the liver done on the chest CT. She states that she strains a lot with a bowel movement. She has a history of chronic pancreatitis from remote alcohol abuse. She was told about 3 years ago that she needed a hysterectomy but says that subsequent evaluations were negative for this. She does have a history of ovarian cyst. She has a history of a moderate-sized hiatal hernia. She had an EGD done 02/10/2013 for problems with nausea, vomiting, abdominal pain, with the finding of a small duodenal polyp. 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 She had a colonoscopy in February of 2005 for problems with diarrhea that was normal. She had a flexible sigmoidoscopy on 02/10/2005 for a complaint of rectal bleeding and was found to have postsurgical anatomy.   PAST MEDICAL HISTORY:  1. Chronic pancreatitis.  2. Chronic pain syndrome.  3. Hypertension.  4. COPD with multiple exacerbations, recent pneumonia as noted above.  5. Craniotomy for meningioma.  6. Morbid obesity.  7. Narcotic drug-seeking behavior.  8. History of hepatitis C.  9. Cirrhosis.  10. Remote alcohol abuse and remote crack cocaine use. She states she has not  had any alcohol for about 10 years and no crack cocaine for about 6 years.  11. History of coronary artery disease.  12. History of tobacco abuse.   GASTROINTESTINAL FAMILY HISTORY: Negative for colorectal cancer, liver disease or ulcers.   SOCIAL HISTORY: She smokes about 1/2 pack of  cigarettes a day. No alcohol as noted above. No current drug abuse as noted above.   ALLERGIES: She is allergic to DILAUDID, IBUPROFEN, MORPHINE, SULFA DRUGS, PENICILLIN, CHOCOLATE, TOMATOES AND STRAWBERRIES.   OUTPATIENT MEDICATIONS: Include the following: Alprazolam 1 mg q.8 hours p.r.n., Ambien 5 mg once a day, amitriptyline 50 mg once a day, azithromycin 500 mg for 7 days (that prescription being written apparently in October of 2014), Ceftin also written in October of 2014, Creon current 12,000 units 2 capsules with a meal 3 times a day, Dilantin 225 mg 3 times a day (that also being written in October of 2014, apparently not a current medication). She is on fluticasone salmeterol 250/50 mcg inhalation, Keppra 500 mg orally twice a day, Lexapro 10 mg once a day, magnesium hydroxide 30 mL orally once a day p.r.n. as needed for constipation, metoprolol succinate 25 mg once a day, omeprazole 40 mg once a day, Percocet 10/325 mg 2 tablets every 6 hours p.r.n. for pain, prednisone 50 mg 1 tablet a day, ProAir HFA CFC 2 puffs 4 times a day p.r.n., quetiapine 100 mg tablet 3.5 tablets once a day at bedtime, Spiriva 18 mcg inhalation capsule twice a day, spironolactone 50 mg tablet 1/2 tablet 3 times a day, VESIcare 10 mg once a day, Z-Pak also prescribed apparently yesterday.   REVIEW OF SYSTEMS: Ten systems reviewed per admission history and physical, agree with same. Abdominal pain, however, seems to be more so reported as lower abdomen.   PHYSICAL EXAMINATION:  VITAL SIGNS: Temperature is 98.6, pulse 101, respirations 20, blood pressure 122/79, pulse ox 95%.  GENERAL: She is a morbidly obese, 59 year old, African American female, anxious, no acute distress.  HEENT: Normocephalic, atraumatic. Eyes are anicteric. Nose: Septum midline. Oropharynx: No lesions.  NECK: No JVD.  HEART: Regular rate and rhythm.  LUNGS: Clear.  ABDOMEN: Morbidly obese. Unable to palpate internal organs. She is tender to  palpation across the lower abdomen. She has a large midline scar extending from the umbilicus to the pubic arch.  RECTAL: Anorectal exam deferred.  EXTREMITIES: Show 1 to 2+ lower extremity edema.  NEUROLOGICAL: Cranial nerves II through XII grossly intact.   LABORATORIES: Include the following: On admission to the hospital, she had a glucose of 220, BUN 8, creatinine 0.94, sodium 136, potassium 4.0, chloride 107, bicarb 24, calcium 8.6. She has had cardiac enzymes x 4 which have been normal. She had a urine drug screen showing positive for opiates as well as tricyclic antidepressant. She had a hemogram on the 30th of November showing a WBC of 7.8, hemoglobin and hematocrit 10.1/31.2, platelet count of 126, MCV 82. Repeat hemoglobin/hemogram this morning showed a white count of 8.0, H and H 10.0/31.5. She has had a PA and lateral chest film showing mild right lung base opacity, favoring atelectasis. She had a CT scan of the chest without contrast, this showing mild bibasilar atelectasis or scarring. No other acute intrathoracic process. Limited upper abdominal images showing a nodular/cirrhotic liver contour. She had a CT of the head without contrast showing left frontotemporal encephalomalacia and postoperative change. No CT evidence of acute intracranial abnormality. She had an ABG yesterday showing a pH of 7.42,  pO2 was 69 at the time on a nasal cannula, uncertain of the oxygen level. She had an EKG November 30th showing normal sinus rhythm, prolonged QT compared with EKG on 05/21/2013.   ASSESSMENT:  1. Hematemesis: This is described per the patient as a teaspoon that occurred perhaps once or twice last night and again today once. There has been no larger volume hematemesis and no recurrence over the course of the afternoon. The patient has been hemodynamically stable. The patient was allowed to continue eating a regular diet per hospitalist.  2. Lower abdominal pain in the setting of chronic  constipation and a history of rectovaginal fistula and repair with colostomy and reversal. The patient apparently has a history of chronic lower abdominal pain since that time.  3. History of chronic pancreatitis in the setting of remote alcohol abuse and substance abuse.  4. Reported occasional blood in stool in the setting of her problems with chronic constipation.   RECOMMENDATIONS:  1. Stool Hemoccult. This has been ordered and is pending collection. She has not had a bowel movement since admission.  2. Recommend EGD for further evaluation of the hematemesis as well as a flexible sigmoidoscopy. I will talk with the patient further about this tomorrow.  3. Recommend CT scan of abdomen and pelvis with contrast after the above.  4. Continue PPI as you are. Further recommendations to follow.   ____________________________ Lollie Sails, MD mus:gb D: 06/08/2013 21:53:47 ET T: 06/08/2013 22:35:47 ET JOB#: [id number]  cc: Lollie Sails, MD, <Dictator> Lollie Sails MD ELECTRONICALLY SIGNED 06/14/2013 17:19

## 2014-10-28 NOTE — Discharge Summary (Signed)
PATIENT NAME:  [name], [name] MR#:  [id number] DATE OF BIRTH:  03-18-1956  DATE OF ADMISSION:  04/19/2013 DATE OF DISCHARGE:  04/22/2013  REASON FOR ADMISSION: Abdominal pain.  FINAL DIAGNOSES AT ADMISSION:   1.  Severely ill.  2.  Septic shock. 3.  Hypoxemic respiratory failure.  4.  Hyperglycemia. 5.  Seizure disorder.  6.  Chronic obstructive pulmonary disease with no signs of exacerbation.  7.  Hypertension.  8.  Cirrhosis of the liver.  9.  Hepatitis C. 10.  History of alcohol abuse.  11.  Hypokalemia.  12.  Anemia of chronic disease.  13.  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  14.  Hemoptysis due to pneumonia.  15.  Bilateral hospital-acquired pneumonia. 16.  constipation.  17.  History of blood in the stool.  18.  History of benign polyps. 19.  History of brain tumor. 20.  History of chronic pancreatitis.  21.  History of rectovaginal fistula.   22.  Hypomagnesemia. 23.  Hypokalemia.  MEDICATIONS ON DISCHARGE:  Prednisone 10 mg tablets tapered down from 40, (decreasing  10 mg every day, spironolactone 50 mg tablet take tablets 3 times daily, Percocet 10/325 mg 2 tablets every 8 hours, magnesium hydroxide 8% oral suspension at bedtime for constipation, Dilantin 9 mL orally two times a day of 25 mg/mL, Keppra 500 mg daily, amitriptyline 50 mg once a day at bedtime, Lexapro 10 mg once a day, Seroquel 100 mg 3.5 tablets at bedtime ambien 5 mg at bedtime, metoprolol succinate 25 mg once a day, ProAir HFA 90 mcg 2 puffs four times a day as needed for shortness of breath, Advair 250 mcg/50 mcg take one puff twice daily, Spiriva 18 mcg p.o. daily, Ceftin 500 mg twice daily for 7 days, Creon 12,000 units  two capsules 3 times daily, MiraLax 3350 17 g twice daily, omeprazole 40 mg once daily, azithromycin 500 mg once daily for 7 days, VESIcare 10 mg once a day, alprazolam 1 mg every 8 hours as needed for anxiety.   FOLLOWUP:  With Dr. Debbora Dus in 1 to 2 weeks. [facility]  Oncology for brain tumor assessment in 3 to 4 weeks. Pain management 1 to 2 weeks.  IMPORTANT RESULTS:  Her admission blood sugar was 177, potassium was 3.4, creatinine was 1.01, alkaline phosphatase 149, AST 53, albumin 2.9. UDS was positive only for tricyclic antidepressants. White count was 8.0, hemoglobin was 10, platelets 143,000. Urine culture grew out Escherichia coli more than 100,000 sensitive to cefazolin, resistant to ampicillin and ciprofloxacin and Levaquin. Repeat urine culture through indwelling catheter showed no growth.  The first culture was positive and was a sample taken from a clean-catch, and the patient has a rectovaginal fistula.  C. difficile was negative. White count went up to 11.4 and by discharge was 9.9, hemoglobin at discharge 9.6, platelets at  discharge 127. Magnesium was 1.7.  HOSPITAL COURSE: Ms. Fatmata Legere is a very nice 59 year old female with history of multiple problems, as mentioned above, comes with 3 days of abdominal pain, suprapubic, radiation to bilateral flanks, 8/10, associated with fever, chills, without any relieving factors. She has been really short of breath in the past couple of days. She was previously admitted at [facility] 04/16/2013 for similar symptoms in the Emergency Department, was diagnosed with a urinary tract infection.  At that moment, her UA was taken, and it was done as clean-catch.  I have to mention that patient has a rectovaginal fistula.    The patient  was found to hypotensive in the ER, her blood pressure 70s over 40s, for which she was on dopamine. She received at least 2 liter boluses, became hypoxemic, requiring O2 supplemental, with oxygen levels in the 70s.   PROBLEMS: 1.  Septic shock.  The patient had septic shock, mostly due to bilateral pneumonia and at the beginning the presentation seemed to fit more with urinary tract infection, although what we can determine is that the patient had a urine sample that was contaminated due to a  rectovaginal fistula. The patient has been treated for urinary tract infections in the past, and always shows E. coli.  After we repeated the test with I and O catheter, her urine was clean.  The patient was started on vancomycin and cefepime in the ER.  After my evaluation, started her on Levaquin to complete coverage for healthcare-acquired pneumonia.   The patient was started on dopamine and Levophed, then was weaned off and tolerated it well.  As far as her E. coli, I think this was just contamination from the stool into the vagina.  2.  Hypoxemic respiratory failure, likely due to bilateral pneumonia.  The patient had a CT scan of the chest and abdomen that showed bilateral consolidations in both bases. The patient was treated with broad-spectrum antibiotics. Blood cultures did not grow anything. Sputum cultures were ordered, but never obtained, due to problems with the patient expectorating\r The patient had a couple of episodes of hemoptysis, which were likely related to the bilateral pneumonia. This resolved during the first day of admission.  The patient was able to be weaned off completely from oxygen prior to her discharge.  3.  Hemoptysis, as mentioned above. Coagulopathy was not an issue, although the patient has liver cirrhosis, but is okay as the INR is not significantly elevated.  She used to be a smoker, for which she is at risk of cancer.  A CT scan of the abdomen and chest was done and did not show any significant lung tumors. If the problem persists, the patient will need to have a bronchoscopy.  4.  Hyperglycemia.  This was likely due to steroids, as the patient was on stress steroids due to septic shock.   5.  Seizure disorder. The patient on Keppra and Dilantin.  The patient says that she has been passing out and having spells with headaches and seizures recently. She has not been concerned about it, but she was told that this may be symptoms of her tumor recurring.  6.  The  patient has been diagnosed with a meningioma.  I tried to get an MRI of the brain to complete that evaluation, but unfortunately the patient got claustrophobic and did not want to have it done now.    7.  Adrenal adenoma.  The patient was treated with steroids for possible adrenal insufficiency, although cortisol levels were not significantly decreased.  The patient improved with steroids. The patient has a left adrenal nodule, 1.8 cm, which is likely secondary to adrenal adenoma.    8.  COPD was stable in this patient.  Continue Advair and Spiriva.  9.  Hypokalemia, hypomagnesemia got replaced.   10.  Anemia of disease due to  hepatitis of the liver was present and stable, but she has hypersplenism with platelets around 120s to 140s, which were stable.  11.  The patient was commited due to use of pain medications and narcotics. The patient states that she has been taking narcotics although her UDS was  negative. The patient was very pushy about being prescribed narcotics. She received some narcotics cautiously .  She was discharged on small amounts of medications due to the fact that she kept saying that she did not have a doctor.  I asked her several times, she said she has not seen anyone, she is not going to see anyone, she is not scheduled to have any visits, although we figured out that the patient has been seeing Dr. Jacqualine Code.  On top of that, the patient also has been fired from several practices due her narcotic-seeking behavior. We tried to get an appointment with a new provider, but, again, the patient actually has been seeing Dr. Jacqualine Code. We recommended to go back to him.    TIME SPENT: I spent about 60 minutes with this discharge.     ____________________________ [place] Sink, MD rsg:cg D: 04/27/2013 23:55:00 ET T: 04/28/2013 01:41:51 ET JOB#: [id number]  cc: Milinda Pointer. Jacqualine Code, MD [facility] [place] Sink, MD, <Dictator>    Sparkles Mcneely America Brown  MD ELECTRONICALLY SIGNED 05/01/2013 23:10

## 2014-10-28 NOTE — Consult Note (Signed)
Pt itching a lot, said it has been present since Monday but this is the first I have heard of it.  Pt lipase down to 600 range.  She is asking for restarting Elavil.  I have no objections but willl talk to Hospitalist.  If lipase stays down will restart clear liquids.  Electronic Signatures: Manya Silvas (MD)  (Signed on 05-Jan-14 10:27)  Authored  Last Updated: 05-Jan-14 10:27 by Manya Silvas (MD)

## 2014-10-28 NOTE — Consult Note (Signed)
Chief Complaint:  Subjective/Chief Complaint stable overnight, seen for abdominal pain and hematemesis.  mild nausea no further emesis, continues with lower abdominal pain, small hard bm.   VITAL SIGNS/ANCILLARY NOTES: **Vital Signs.:   03-Dec-14 09:15  Vital Signs Type Post Fall  Temperature Temperature (F) 98.2  Celsius 36.7  Temperature Source oral  Pulse Pulse 97  Respirations Respirations 20  Systolic BP Systolic BP 774  Diastolic BP (mmHg) Diastolic BP (mmHg) 77  Mean BP 90  Pulse Ox % Pulse Ox % 93  Pulse Ox Activity Level  At rest  Oxygen Delivery Room Air/ 21 %   Brief Assessment:  Cardiac Regular   Respiratory clear BS   Gastrointestinal details normal Bowel sounds normal  No rebound tenderness  No gaurding  obese. mild lower abdominal discomfort.   Lab Results: Routine Coag:  02-Dec-14 22:27   Prothrombin  16.6  INR 1.3 (INR reference interval applies to patients on anticoagulant therapy. A single INR therapeutic range for coumarins is not optimal for all indications; however, the suggested range for most indications is 2.0 - 3.0. Exceptions to the INR Reference Range may include: Prosthetic heart valves, acute myocardial infarction, prevention of myocardial infarction, and combinations of aspirin and anticoagulant. The need for a higher or lower target INR must be assessed individually. Reference: The Pharmacology and Management of the Vitamin K  antagonists: the seventh ACCP Conference on Antithrombotic and Thrombolytic Therapy. JOINO.6767 Sept:126 (3suppl): [id number]. A HCT value >55% may artifactually increase the PT.  In one study,  the increase was an average of 25%. Reference:  "Effect on Routine and Special Coagulation Testing Values of Citrate Anticoagulant Adjustment in Patients with High HCT Values." American Journal of Clinical Pathology 2006;126:400-405.)  Routine Hem:  02-Dec-14 09:44   WBC (CBC) 8.0  RBC (CBC) 3.83  Hemoglobin (CBC)  10.0   Hematocrit (CBC)  31.5  Platelet Count (CBC)  139  MCV 82  MCH 26.2  MCHC  31.9  RDW  17.5  Neutrophil % 63.7  Lymphocyte % 18.4  Monocyte % 5.9  Eosinophil % 10.5  Basophil % 1.5  Neutrophil # 5.2  Lymphocyte # 1.5  Monocyte # 0.5  Eosinophil #  0.9  Basophil # 0.1 (Result(s) reported on 08 Jun 2013 at 10:47AM.)    22:27   WBC (CBC) 9.2  RBC (CBC)  3.68  Hemoglobin (CBC)  9.7  Hematocrit (CBC)  30.1  Platelet Count (CBC)  138  MCV 82  MCH 26.3  MCHC 32.1  RDW  17.7  Neutrophil % 50.6  Lymphocyte % 37.6  Monocyte % 10.5  Eosinophil % 0.6  Basophil % 0.7  Neutrophil # 4.7  Lymphocyte # 3.5  Monocyte #  1.0  Eosinophil # 0.1  Basophil # 0.1 (Result(s) reported on 08 Jun 2013 at 10:49PM.)   Radiology Results: XRay:    30-Nov-14 22:53, Chest PA and Lateral  Chest PA and Lateral   REASON FOR EXAM:    cough, recent pna  COMMENTS:   May transport without cardiac monitor    PROCEDURE: DXR - DXR CHEST PA (OR AP) AND LATERAL  - Jun 06 2013 10:53PM     *RADIOLOGY REPORT*    Clinical Data: Pain, shortness of breath, cough    CHEST - 2 VIEW    Comparison: 05/24/2013    Findings: Stable cardiomediastinal contours with mild aortic  tortuosity.  Heart size within normal range. Mild right lung base  opacity.  Otherwise, no confluent airspace opacity, pleural  effusion, or pneumothorax. Sequelae of prior posterior left rib  fractures.  No acute osseous finding.     IMPRESSION:  Mild right lung base opacity, favor atelectasis.      Original Report Authenticated By: Carlos Levering, M.D.         Verified By: Tommi Rumps, M.D.,  CT:    01-Dec-14 00:39, CT Chest Without Contrast  CT Chest Without Contrast   REASON FOR EXAM:    cough, evaluate for pneumonia  COMMENTS:   May transport without cardiac monitor    PROCEDURE: CT  - CT CHEST WITHOUT CONTRAST  - Jun 07 2013 12:39AM     *RADIOLOGY REPORT*    Clinical Data: Cough    CT CHEST WITHOUT  CONTRAST    Technique:  Multidetector CT imaging of the chest was performed  following the standard protocol without IV contrast.    Comparison: 06/06/2013 radiograph, 04/20/2013 CT  Findings: Scattered atherosclerosis of the aorta and branch vessels  without aneurysmal dilatation.  There is ectasia along the arch up  to 3.6 cm.  Normal heart size.  Coronary artery calcifications.  Trace pericardial fluid versus thickening.  No pleural effusion.  No intrathoracic lymphadenopathy.  Limited upper abdominal images  show a nodular/cirrhotic liver contour.  Cholecystectomy clips.    Central airways are patent.  No pneumothorax.  Mild dependent  atelectasis and linear opacity right lung base which may reflect  scarring.  Detailed parenchymal evaluation degraded by respiratory  motion.    No acute osseous finding.     IMPRESSION:  Mild bibasilar atelectasis or scarring.  Otherwise, no acute  intrathoracic process.    Limited upper abdominal images show a nodular/cirrhotic liver  contour.      Original Report Authenticated By: Carlos Levering, M.D.         Verified By: Tommi Rumps, M.D.,    01-Dec-14 03:11, CT Head Without Contrast  CT Head Without Contrast   REASON FOR EXAM:    encephalopathy  COMMENTS:       PROCEDURE: CT  - CT HEAD WITHOUT CONTRAST  - Jun 07 2013  3:11AM     *RADIOLOGY REPORT*    Clinical Data: Encephalopathy    CT HEAD WITHOUT CONTRAST    Technique:  Contiguous axial images were obtained from the base of  the skull through the vertex without contrast.    Comparison: 12/23/2012  Findings: Left frontal temporal encephalomalacia and sequelae of  prior craniotomy.  No hydrocephalous.  No definite CT evidence of  acute infarction.  No intraparenchymal hemorrhage, mass, or mass  effect.  No extra-axial hematoma.  The visualized paranasal sinuses  and mastoid air cells are predominately clear.     IMPRESSION:  Left frontal temporal  encephalomalacia and postoperative change.  No CT evidence of acute intracranial abnormality.      Original Report Authenticated By: Carlos Levering, M.D.         Verified By: Tommi Rumps, M.D.,   Assessment/Plan:  Assessment/Plan:  Assessment 1) hematemesis-stable small amount , not recurrent, no melena.  2) lower abdominalpain-reported occasional blood in stool.   Plan 1) egd today.  I have discussed the risks benefits and complicatiosno f egd to include not limited to bleeding infection perforation and sedation and she wishes to proceed.  2) recommend ct abd and pelvis 3)  sedated flex sig versus colonoscopy after above   Electronic Signatures: Loistine Simas (MD)  (Signed 03-Dec-14 13:44)  Authored: Chief Complaint, VITAL SIGNS/ANCILLARY NOTES, Brief Assessment, Lab Results, Radiology Results, Assessment/Plan   Last Updated: 03-Dec-14 13:44 by Loistine Simas (MD)

## 2014-10-28 NOTE — Discharge Summary (Signed)
PATIENT NAME:  [name], [name] MR#:  [id number] DATE OF BIRTH:  1956/05/24  DATE OF ADMISSION:  06/08/2013 DATE OF DISCHARGE:  06/10/2013  DISCHARGE DIAGNOSES: 1.  Chronic abdominal pain.  2.  Chronic constipation.  3.  Gastrointestinal bleed, questionable gastritis. Needs further evaluation in gastroenterology  office.  Failed esophagogastroduodenoscopy inpatient. 4.  Hypertension.   CONDITION ON DISCHARGE: Stable.   CODE STATUS: Full code.   MEDICATIONS ON DISCHARGE: 1.  Spironolactone 50 mg, take 1/2 tablet 3 times a day.  2.  Magnesium hydroxide 8% oral suspension 30 mL once a day.  3.  Dilantin 25 mg/mL oral suspension 9 mL 3 times a day.  4.  Keppra 500 mg oral 2 times a day.  5.  Amitriptyline 50 mg oral once a day.  6.  Quetiapine100 mg oral, 3.5 tablets once a day.  7.  ProAir inhaler 2 puffs 4 times a day.  8.  Fluticasone and salmeterol 1 puff inhaled 2 times a day.  9.  Spiriva 18 mcg inhalation capsule 2 times a day.  10.  Creon 12,000 units oral delayed-release capsule 2 capsules 3 times a day.  11.  Polyethylene glycol 17 grams 2 times a day.  12.  VESIcare 10 mg oral tablet once a day.  13.  Acetaminophen and oxycodone 325 mg plus 10 mg oral tablet 1 to 4 times a day as needed for pain.  14.  Lexapro 10 mg oral tablet once a day for 10 days.  15.  Alprazolam 0.5 mg oral tablet every 8 hours as needed.  16.  Omeprazole 40 mg 2 times a day.  16.  Benadryl 25 mg 3 times a day as needed for itching.   DIET: Low sodium, low fat, low cholesterol diet. Regular consistency.   ACTIVITY: As tolerated.   TIMEFRAME TO FOLLOWUP: In GI clinic in 1 to 2 weeks with Dr. Loistine Simas.   HISTORY OF PRESENT ILLNESS: By Dr. Leia Alf. Sudini on first of December, a 59 year old morbidly obese African American female with COPD, chronic abdominal pain, came to Emergency Room with abdominal pain, vomiting, shortness of breath. Multiple ER visits with similar complaints in the past with  abdominal pain, nausea, vomiting due to chronic pancreatitis. The patient received 4 mg of morphine in Emergency Room and after that 6 mg of morphine and followed by hypotension but naloxone was given and blood pressure improved after that. She was admitted complaints of abdominal pain for further evaluation.   HOSPITAL COURSE AND STAY:  1.  For her chronic abdominal pain complaint we gave her symptomatic management with Zofran and pain management with Percocet but the patient had an episode of bright red blood vomit and so GI consult was called in. They planned to do EGD but the patient had severe hypoxia during the procedure and they had to terminate the procedure in between and advised to continue symptomatic treatment and for her to follow in the clinic as her hemoglobin was stable.  2.  Chronic obstructive pulmonary disease exacerbation. The patient had mild wheezing. We continued steroid and nebulizer and we gave Levaquin IV with which the patient had  improvement in her symptoms.  3.  Chronic pain symptoms. She continuously asking for multiple pain medications but kept it limited to Percocet and explained her about her hypotension symptoms caused by morphine so could not give anymore.  4.  Anemia of chronic disease. This was stable and she was advised to continue following with primary care  physician.   IMPORTANT LABORATORY AND RADIOLOGICAL RESULTS IN THE HOSPITAL: Creatinine 0.94 on presentation. Troponins all 3 negative, less than 0.02. Urine for toxicology positive for opiates. Hemoglobin was 10.1 on admission, remained up to 9.7 and stable. CT chest without contrast was done on admission which showed mild basilar atelectasis or scaring, otherwise no acute intrathoracic process. CT head without contrast left frontotemporal encephalomalacia with postoperative changes, no CT evidence of acute intracranial abnormality. CT abdomen and pelvis with contrast was done as advised by GI which reported no  acute finding within abdomen or pelvis, no  features of liver compatible with cirrhosis, prior cholecystectomy.   TOTAL TIME SPENT ON THIS DISCHARGE: 40 minutes.   ____________________________ Ceasar Lund Anselm Jungling, MD vgv:cs D: 06/13/2013 17:40:43 ET T: 06/13/2013 20:51:25 ET JOB#: [id number]  cc: Ceasar Lund. Anselm Jungling, MD, <Dictator> Lollie Sails, MD Vaughan Basta MD ELECTRONICALLY SIGNED 06/15/2013 9:20

## 2014-10-28 NOTE — Consult Note (Signed)
Chief Complaint:  Subjective/Chief Complaint No new complaints. Positive cough. Still with generalized pain which is a chronic problem. Wants regular food.   VITAL SIGNS/ANCILLARY NOTES: **Vital Signs.:   13-Feb-14 05:05  Vital Signs Type Routine  Temperature Temperature (F) 97.3  Celsius 36.2  Temperature Source oral  Pulse Pulse 104  Respirations Respirations 20  Systolic BP Systolic BP 626  Diastolic BP (mmHg) Diastolic BP (mmHg) 82  Mean BP 93  Pulse Ox % Pulse Ox % 91  Pulse Ox Activity Level  At rest  Oxygen Delivery Room Air/ 21 %   Brief Assessment:  Additional Physical Exam Abdomen is soft and benign.   Lab Results: Hepatic:  13-Feb-14 05:29   Bilirubin, Total 0.7  Alkaline Phosphatase  172  SGPT (ALT) 47  SGOT (AST)  65  Total Protein, Serum 7.9  Albumin, Serum  3.1  Routine Chem:  13-Feb-14 05:29   Lipase  572 (Result(s) reported on 20 Aug 2012 at 06:23AM.)  Glucose, Serum 81  BUN  6  Creatinine (comp) 0.80  Sodium, Serum 142  Potassium, Serum  3.4  Chloride, Serum  111  CO2, Serum 25  Calcium (Total), Serum  8.4  Osmolality (calc) 280  eGFR (African American) >60  eGFR (Non-African American) >60 (eGFR values <41mL/min/1.73 m2 may be an indication of chronic kidney disease (CKD). Calculated eGFR is useful in patients with stable renal function. The eGFR calculation will not be reliable in acutely ill patients when serum creatinine is changing rapidly. It is not useful in  patients on dialysis. The eGFR calculation may not be applicable to patients at the low and high extremes of body sizes, pregnant women, and vegetarians.)  Anion Gap  6   Assessment/Plan:  Assessment/Plan:  Assessment Pancreatitis, clinically much better. Chronic pain. Cough.   Plan Agree with full liquid diet. Advance slowly. Probable DC in 1-2 days.   Electronic Signatures: Jill Side (MD)  (Signed 13-Feb-14 14:15)  Authored: Chief Complaint, VITAL  SIGNS/ANCILLARY NOTES, Brief Assessment, Lab Results, Assessment/Plan   Last Updated: 13-Feb-14 14:15 by Jill Side (MD)

## 2014-10-28 NOTE — H&P (Signed)
PATIENT NAME:  [name], [name] MR#:  [id number] DATE OF BIRTH:  06/13/1956  DATE OF ADMISSION:  07/06/2012  PRIMARY CARE PHYSICIAN: None local.  REFERRING PHYSICIAN:  Francene Castle, MD   CHIEF COMPLAINT: Epigastric abdominal pain, nausea, vomiting.   HISTORY OF PRESENT ILLNESS: The patient is a 59 year old morbidly obese female with a past medical history of chronic pancreatitis, alcoholic liver cirrhosis, history of hepatitis C, seizure disorder, astrocytoma, hypertension, hiatal hernia, osteoporosis, COPD, chronic  history of schizophrenia, noncompliance, recent history of stroke with left-sided weakness who is presenting to the ER with a chief complaint of epigastric abdominal pain radiating to the back associated with nausea and vomiting. The patient's blood work has revealed an elevated lipase at 1241. The alkaline phosphatase is at 186.  The patient has received Dilaudid IV in the ER for her epigastric abdominal pain and eventually ER physician was notified that the patient was ALLERGIC TO DILAUDID. The patient was having severe itching regarding this for which she has received Benadryl IV and Solu-Medrol IV as well. The patient denies any shortness of breath though. During my examination the patient was still itching and reporting that she could tolerate morphine and that she had IV morphine in the past several times during the previous admission for epigastric abdominal pain. Hospitalist team is called to admit the patient for acute on chronic pancreatitis. The patient denies any diarrhea or constipation. Denies any chest pain or shortness of breath. She has also reported that she is on the liver transplantation list. She admits that she still drinking beer and drinks every day at least 1 to 2 beers. Denies any other complaints but dizziness as she stands up. Denies any passing out.   PAST MEDICAL HISTORY:  Chronic liver cirrhosis hepatitis C, seizure disorder, brain tumor, astrocytoma status post  craniotomy and resection at [facility] in the past, hypertension hiatal hernia, alcohol abuse, osteoporosis, history of stroke with minimal left-sided weakness, history of chronic pancreatitis, noncompliance.   PAST SURGICAL HISTORY: Astrocytoma status post craniotomy, cholecystectomy, appendectomy, colostomy for rectovaginal fistula repair, left knee surgery.   ALLERGIES:   1.  DILAUDID. 2.  IBUPROFEN. 3.  PENICILLIN. 4.  SULFA. 5.  CHOCOLATE. 6.  STRAWBERRIES. 7.  TOMATO.    HOME MEDICATION LIST:    Ambien 10 mg once daily.  Spironolactone 50 mg 3 times a day, Spiriva 18 mcg inhalation once daily.  Percocet 10/325 mg q.6 hours.  Omeprazole 40 mg once daily.  Metoprolol succinate 1 tablet p.o. once daily.  Keppra 500 mg 2 times a day.  Lactulose 30 mL p.o. 2 times a day. Escitalopram 20 mg once a day. Dilantin dose unknown 2 times a day. Creon 12,000 units 1 capsule 3 times a day. Amlodipine 10 mg once a day.  Amitriptyline 1 tablet p.o. once daily. Advair Diskus 1 puff inhalation twice a day.   PSYCHOSOCIAL HISTORY: Lives at home.  Reports that she has quit smoking but still drinking beer every day.  Drinks at least 2 beers every night.  FAMILY HISTORY:  Father died at age 53 from heart attack. Mother is alive and suffers from hypertension.  REVIEW OF SYSTEMS:  CONSTITUTIONAL:  Denies fever, fatigue, weakness. Complaining of epigastric abdominal pain radiating to the back.  Denies any weight loss or weight gain. EYES: Denies blurry vision, glaucoma, cataracts.  ENT: Denies tinnitus, epistaxis, ear discharge or snoring. RESPIRATORY: Denies cough, wheezing, COPD, dyspnea, asthma.  CARDIOVASCULAR: Denies chest pain, orthopnea, palpitations or syncopal event.  Complaining of dizziness.  GASTROINTESTINAL: Complaining of nausea and vomiting. Denies diarrhea. Complaining of epigastric abdominal pain radiating to the back.  Denies hematemesis, melena or constipation.  GENITOURINARY: Denies  dysuria, hematuria or renal failure.  GYNECOLOGIC: Denies breast mass or vaginal discharge.  ENDOCRINE: Denies polyuria, polyphagia, polydipsia. Denies any thyroid problems. HEMATOLOGIC/LYMPHATIC:  Denies anemia, bleeding.  MUSCULOSKELETAL: Denies pain in the neck, back, shoulder. Denies any limited activity. NEUROLOGIC:  Denies vertigo but complaining of dizziness.  Chronic history of seizures, however, denies any dementia or memory loss.   PSYCHIATRIC: Denies any insomnia, ADD, OCD.  PHYSICAL EXAMINATION:  VITAL SIGNS:  Temperature 99.5, pulse 91, respiratory rate 20, blood pressure 127/73.  GENERAL APPEARANCE:  Well built and obese, not in acute distress.  HEENT: Normocephalic, atraumatic. Pupils are equally reactive to light and accommodation. No conjunctival injection, extraocular movements are intact. No nasal congestion. No postnasal drip. No sinus tenderness. No ear discharge. Tympanic membranes are intact.  NECK: Supple. No JVD. No thyromegaly.  LUNGS: Clear to auscultation bilaterally. No accessory muscle usage. No anterior chest wall tenderness.  CARDIAC: S1, S2 normal. Regular rate and rhythm. No murmurs.  EXTREMITIES: 2+ pulses.  GASTROINTESTINAL: Soft, obese. Bowel sounds are positive in all 4 quadrants.  Positive epigastric tenderness and no CVA tenderness. Bowel sounds are positive. No masses felt. NEUROLOGIC: Awake, alert and oriented x 3. Sensory is intact. Motor with left-sided weakness, which is chronic from previous stroke. Reflexes are 2+.  PSYCHIATRIC: Normal mood and affect.  MUSCULOSKELETAL: No joint effusion, tenderness or erythema.  INTEGUMENTARY: No acne, rash, lesions.   LABORATORY AND DIAGNOSTIC DATA:  Glucose 118, BUN 5, creatinine 0.78, sodium 137, potassium 3.5, chloride 105, CO2 21, GFR greater than 50, anion gap 11, lipase is 1241, total protein 8.8, alkaline phosphatase 186, AST 112. WBC 6.5, hemoglobin 11.8, hematocrit 36.5, platelet count 158,000.   Urinalysis: Yellow in color, clear in appearance.  Glucose negative, ketones negative, nitrite negative, leukocyte esterase negative, mucous present.   ASSESSMENT AND PLAN: A 59 year old female presenting to the ER with a chief complaint of epigastric abdominal pain associated with nausea and vomiting.  She will be admitted with the following assessment and plan:  1.  Acute on chronic pancreatitis.  Plan:  We will make her n.p.o.  We will give her intravenous fluids and proton pump inhibitor. Morphine as needed basis for pain. Benadryl be provided for itching and also we will give her Solu-Medrol as the patient received 1 dose of intravenous Dilaudid and she is reporting that she is allergic to intravenous Dilaudid and it gives her intense itching.  2.  Alcohol abuse.  Counseled the patient to quit drinking alcohol. Will put her on CIWA protocol.  3.  Chronic obstructive pulmonary disease, chronic in nature. No exacerbation.  Will continue DuoNeb.  4.  Chronic alcoholic cirrhosis.  The patient is on liver transplantation list as reported by the patient.  5.  Chronic history of seizures. We will continue her home medications. 6.  Old history of stroke with left-sided weakness, stable.  7.  Will provide her gastrointestinal and deep vein thrombosis prophylaxis.  CODE STATUS: She is full code.   The diagnosis and plan of care was discussed in detail with the patient. She is aware of the plan.   Total time spent on the admission: 60 minutes.    ____________________________ Nicholes Mango, MD ag:ct D: 07/06/2012 23:19:05 ET T: 07/07/2012 07:31:16 ET JOB#: [id number]  cc: Nicholes Mango, MD, <Dictator> Outside Physician Illene Silver  Margaretmary Eddy MD ELECTRONICALLY SIGNED 07/12/2012 2:06

## 2014-10-28 NOTE — Discharge Summary (Signed)
PATIENT NAME:  [name], [name] MR#:  [id number] DATE OF BIRTH:  August 28, 1955  DATE OF ADMISSION:  08/18/2012 DATE OF DISCHARGE:  08/23/2012  REASON FOR ADMISSION: Abdominal pain and cough.   DISCHARGE DIAGNOSES: 1.  Pancreatitis.  2.  Chronic obstructive pulmonary disease exacerbation.   CONSULTANTS: Dr. Jill Side.   DISPOSITION: Home.   DIET: Low-fat diet.   MEDICATIONS AT DISCHARGE:  Advair Diskus 250/50 twice daily, Spiriva 18 mcg once daily, lactulose p.r.n., Combivent 100 mcg/20 mcg 4 times a day as needed, albuterol ipratropium 2.5/0.5 inhaled, spironolactone 50 mg 3 times daily, Dilantin 125 mg oral suspension twice daily, Keppra 500 mg twice daily, amitriptyline 50 mg once daily, Lexapro 10 mg once daily, promethazine 25 mg as needed for nausea, quetiapine 100 mg once daily at bedtime, Ambien 10 mg at bedtime p.r.n., metoprolol 25 mg once a day, Creon take 2 capsules 3 times a day, omeprazole 40 mg 2 times a day, amlodipine 10 mg once daily, Percocet 10/325 mg p.o. every 6 hours as needed for pain, prednisone 40 mg taper within the next 4 days pain, nicotine taper from 21 mg to 7 mg decreasing x 7 mg every 7 days, alprazolam 1 mg every 8 hours p.r.n., Tussionex 5 mL twice daily.  FOLLOWUP:  The patient is to follow with new PCP and pain which management in the next couple of weeks.   IMPORTANT LABORATORY AND DIAGNOSTIC DATA:  On admission glucose 126, creatinine 0.83, lipase 1600.  Her LFTs were slightly elevated with bilirubin 1.4, alkaline phosphatase 259, AST 128. LFTs trended down, lipase came down to 572 and then spiked up to 833, magnesium was 1.7 before being repleted.  Potassium has been low as 2.5 and 2.3. Dilantin level was 5.4 at admission and 15.3 during that hospitalization.  The patient's platelets decreased from 179 to 120 and then 123. Hemoglobin 10.  Urinalysis negative for infections.  EKG: Sinus tachycardia.   HOSPITAL COURSE:  This patient is a nice 59 year old  female who has a history of COPD, hypertension, recurrent pancreatitis, history of seizure disorder, depression and hepatitis C with questionable cirrhosis. The patient comes with a history of also chronic alcohol abuse and a 2-day history of diffuse abdominal pain associated with nausea and vomiting. The patient had some dark colored vomitus and black stools and she has mentioned these symptoms on previous occasions, although her hemoglobin is always fine and guaiac has been negative.   The patient was evaluated for the abdominal pain. She was having some loose bowel movements. She was diagnosed with pancreatitis. She was kept n.p.o., IV fluids were given and she was started on IV steroids du to an exacerbation of her COPD as the patient was having significant cough and nausea. She had a very slow recovery.  She was counseled about stopping drinking and about stop smoking and she says that she would. The patient had blood culture, which was gram-positive rod, which was unclear if it was a real pathogen, likely was related to contamination. Blood cultures were repeated and so far they have been negative for which vancomycin is going to be stopped. The patient has COPD exacerbation for which she was taking Solu-Medrol.  The patient to be discharged on prednisone.  Diabetes, stable.  Hypertension was stable.  Depression, continue her medications.  Tobacco abuse. The patient counseled about the importance of quitting smoking. She has been given patches. I spoke a long time about the importance of follow-up. The patient does not have a  primary care physician. She is looking for one.  We gave her information about the PCPs in the area and she says she is going to get one. For now, we are going to give her prescriptions for all her medications for a month and then she needs to follow up for any refills.   CONDITION ON DISCHARGE:   The patient is discharged in good condition.   TIME SPENT: I spent about 40 minutes  with this discharge.    ____________________________ [place] Sink, MD rsg:ct D: 08/23/2012 15:17:32 ET T: 08/24/2012 09:43:20 ET JOB#: [id number]  cc: [place] Sink, MD, <Dictator> Kimi Kroft America Brown MD ELECTRONICALLY SIGNED 08/26/2012 14:21

## 2014-10-28 NOTE — Discharge Summary (Signed)
PATIENT NAME:  [name], [name] MR#:  [id number] DATE OF BIRTH:  13-Jul-1955  DATE OF ADMISSION:  11/25/2012 DATE OF DISCHARGE:  11/27/2012   ADMITTING DIAGNOSIS: Nausea, vomiting, back pain and syncope.   DISCHARGE DIAGNOSES:  1. Nausea and vomiting secondary to possible pyelonephritis, although urine cultures were negative.  2. Syncope, possibly due to vasovagal syncope due to nausea and straining. The patient has no arrhythmia on telemetry. Echocardiogram of the heart did not show any significant abnormality except moderate mitral and moderate tricuspid regurgitation.  3. Back pain, possibly due to lumbar disk disease. Should be seen as an outpatient.  4. Possible chronic obstructive pulmonary disease exacerbation, now symptoms are back to baseline.  5. History of brain tumor, on Keppra.  6. History of pancreatitis. Continue Creon.  7. Hypertension.  8. Yeast infection.  9. History of rectovaginal fistula repair with recurrence of symptoms. She will need outpatient followup with Dr. Tamala Julian.  10. History of chronic pancreatitis, alcohol induced.  11. Continuous tobacco use.  12. Status post craniotomy and resection for brain tumor.  13. Chronic pain syndrome.  14. Morbid obesity.  15. Depression.  16. Cirrhosis.  45. Hepatitis C.  18. History of coronary artery disease with recent stress test done in 2012, which was negative.  19. Status post cholecystectomy.  20. Status post appendectomy.  21. Status post colostomy for rectovaginal fistula repair.  22. Status post left knee repair.   CONSULTANTS: None.   PERTINENT LABORATORY EVALUATION: . Echocardiogram showed EF 50% to 55%, mildly dilated left atrium, moderate mitral valve regurg, moderate tricuspid regurg. EKG showed normal sinus rhythm with prolonged QT. Troponin was less than 0.02 x3. WBC 10.1, hemoglobin 11.1, platelet count 221. Urine culture showed mixed urine culture. UA showed 2+ leukocytes, 1+ bacteria. Lumbar spine x-rays  showed calcifications noted, no other abnormality noted.   HOSPITAL COURSE: Please refer to H and P done by the admitting physician. The patient is a 59 year old morbidly obese female with multiple admissions for various complaints, who presented with abdominal pain, nausea, vomiting as well as chest pain. The patient was seen in the ED again with these complaints. She was admitted for syncope. Was placed on telemetry. Had an echocardiogram done, which was negative. The patient had no arrhythmia. There was also some concern for possible urine infection. The patient has had recurrent UTIs in the past. Her urine cultures came back negative. She was treated with Invanz; however, since her urine cultures are negative, she will be switched over p.o. nitrofurantoin. The patient also has a history of rectovaginal fistula. She will need outpatient urology followup. At this time, she is stable for discharge.   DISCHARGE MEDICATIONS:  1. Advair 250/50 one puff b.i.d. 2. Spiriva 18 mcg daily. 3. Combivent 1 puff 4 times a day as needed.  4. Albuterol nebs 4 times a day as needed.  5. Spironolactone 50 mg 1 tab p.o. t.i.d.  6. Quetiapine 100 mg 1 tab p.o. q.h.s.  7. Ambien 10 mg q.h.s.  8. Amlodipine 10 daily. 9. Creon 12,000 units 2 caps 3 times a day.  10. Amitriptyline 50 q.h.s.  11. Omeprazole 40 mg 1 tab p.o. b.i.d. 12. Metoprolol succinate 25 p.o. daily. 13. Keppra 500 mg 1 tab p.o. b.i.d. 14. Xanax 1 mg 1 tab p.o. b.i.d. 15. Percocet 5/325 q.6 p.r.n. for pain.  16. Triamcinolone ointment apply to affected area b.i.d. 17. Nitrofurantoin 100 one tab p.o. b.i.d. x5 days.  18. Promethazine 12.5 q.6 p.r.n.  HOME OXYGEN: None.   DIET: Low sodium, low fat, low cholesterol.   ACTIVITY: As tolerated.   FOLLOWUP: With primary MD in 1 to 2 weeks. Follow with Dr. Tamala Julian of surgery for vaginorectal fistula. Follow up with [facility] in 1 to 2 weeks. Follow up with Dr. Rudene Christians for back pain.    DISCHARGE  TIME SPENT: 40 minutes.   ____________________________ [name], MD shp:OSi D: 11/27/2012 10:41:41 ET T: 11/27/2012 11:35:41 ET JOB#: [id number]  cc: Desere Gwin H. Posey Pronto, MD, <Dictator> Alric Seton MD ELECTRONICALLY SIGNED 12/03/2012 14:50

## 2014-10-28 NOTE — Consult Note (Signed)
Pt became intolerant of not eating any food and demanded a diet.  She has a somewhat distended abdomen now, active bowel sounds.  No severe focal tenderness.  Chronic pain from chronic and acute pancreatitis.  Lipase down to 500, will see if it rises with eating.  Pt c/o distended bladder and US showed only 270 ml.  No new suggestions.  Electronic Signatures: Manya Silvas (MD)  (Signed on 06-Jan-14 16:52)  Authored  Last Updated: 06-Jan-14 16:52 by Manya Silvas (MD)

## 2014-10-28 NOTE — Consult Note (Signed)
Patient was seen by me in consultation yesterday but info not entered into EMR due to the upgrade in progress.  Due to continued pain and elevated lipase I ordered her diet to be held and added in a Duragesic patch for smoother pain control.  Electronic Signatures: Manya Silvas (MD)  (Signed on 05-Jan-14 07:59)  Authored  Last Updated: 05-Jan-14 07:59 by Manya Silvas (MD)

## 2014-10-28 NOTE — Consult Note (Signed)
Chief Complaint:  Subjective/Chief Complaint Feels better. Tolerated soft diet. No vomiting. Had a normal BM today.   VITAL SIGNS/ANCILLARY NOTES: **Vital Signs.:   14-Feb-14 06:45  Vital Signs Type Routine  Temperature Temperature (F) 98  Celsius 36.6  Temperature Source oral  Pulse Pulse 96  Respirations Respirations 19  Systolic BP Systolic BP 712  Diastolic BP (mmHg) Diastolic BP (mmHg) 88  Mean BP 103  Pulse Ox % Pulse Ox % 94  Pulse Ox Activity Level  At rest  Oxygen Delivery Room Air/ 21 %   Lab Results: TDMs:  14-Feb-14 07:29   Vancomycin, Trough LAB 13 (Result(s) reported on 21 Aug 2012 at 07:54AM.)   Assessment/Plan:  Assessment/Plan:  Assessment Acute pancreatitis. Clinically resolved.   Plan Continue low fat diet as OP. Abstinence from alcohol again emphasized. Follow up with [facility] clinic GI as OP. Will sign off. Please call me if needed.   Electronic Signatures: Jill Side (MD)  (Signed 14-Feb-14 10:56)  Authored: Chief Complaint, VITAL SIGNS/ANCILLARY NOTES, Lab Results, Assessment/Plan   Last Updated: 14-Feb-14 10:56 by Jill Side (MD)

## 2014-10-28 NOTE — Consult Note (Signed)
Chief Complaint:  Subjective/Chief Complaint seen for hematemesis-EGD unsuccessful due to hypoxia.  Recommend continue bid iv ppi for now, with change to bid po.  Consider an ugi series in about a week.  If repeat egd attempt is needed, patient will need to be intubated, whigh will further increase risk.  Recommend ct abd and pelvis with contrast iv and oral.  Will hold on flex sig for now. Discussed with Dr Marthann Schiller.   Electronic Signatures: Loistine Simas (MD)  (Signed 03-Dec-14 15:29)  Authored: Chief Complaint   Last Updated: 03-Dec-14 15:29 by Loistine Simas (MD)

## 2014-10-28 NOTE — Consult Note (Signed)
Brief Consult Note: Diagnosis: Acute pancreatitis.   Patient was seen by consultant.   Comments: Recurrent pancreatitis due to ETOH abuse.  Recommendations: NPO except ice chips. IV hydration and pain control. Will follow.  Electronic Signatures: Jill Side (MD)  (Signed 11-Feb-14 19:09)  Authored: Brief Consult Note   Last Updated: 11-Feb-14 19:09 by Jill Side (MD)

## 2014-10-28 NOTE — Consult Note (Signed)
PATIENT NAME:  [name], [name] MR#:  [id number] DATE OF BIRTH:  1956/05/06  DATE OF CONSULTATION:  08/18/2012  CONSULTING PHYSICIAN:  Jill Side, MD  CHIEF COMPLAINT: Abdominal pain.   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 The patient was also complaining of dark-colored vomitus as well as black stools, although on previous admissions with acute pancreatitis, she has mentioned similar symptoms, and no sources of bleeding have been found in the past. The patient was evaluated yesterday evening. She has not had any vomiting for several hours, complaining of mid abdominal pain, although appears comfortable. According to her, she has been having 2 to 3 loose bowel movements, which I doubt, for the last couple of days, as mentioned above. She has been actively drinking, and her last drink was day before yesterday.   PAST MEDICAL HISTORY: Significant for: 1. History of recurrent pancreatitis.  2. Hypertension.  3. COPD.  4. History of seizure disorder.  5. Chronic pain syndrome.  6. Morbid obesity.  7. Depression.  8. History of hepatitis C with questionable cirrhosis.   MEDICINES AT HOME: Include spironolactone, Spiriva, Seroquel, promethazine, Percocet, omeprazole, metoprolol, Keppra, lactulose, Lexapro, Dilantin, Creon, Combivent, amlodipine, amitriptyline, Ambien, albuterol and Advair.   ALLERGIES: TO DILAUDID, IBUPROFEN, PENICILLIN, SULFA, STRAWBERRIES AND TOMATOES AS WELL AS CHOCOLATE.   PAST SURGICAL HISTORY: Appendectomy, cholecystectomy, history of astrocytoma status post craniotomy, left knee surgery.   SOCIAL HISTORY: She lives at home. She smokes about a pack a day. She drinks beer every day.  REVIEW OF SYSTEMS: Negative  except for what is mentioned in the history of present illness.   PHYSICAL EXAMINATION:  GENERAL: Morbidly obese female, does not appear to be in any acute distress, appears comfortable.  VITAL SIGNS: Temperature 98.2, pulse is about 100, respirations 18, blood pressure 105/66.  HEENT: Unremarkable.  NECK: Veins are flat.  LUNGS: Grossly clear to auscultation bilaterally with fair air entry and no added sounds.  CARDIOVASCULAR: Regular rate and rhythm.  ABDOMEN: Showed an obese female. Abdomen is soft. Bowel sounds are present, although somewhat sluggish. No significant tenderness was noted on palpation throughout the abdomen. There is no rebound, guarding, ascites or hepatosplenomegaly.  NEUROLOGIC: Appears to be fairly unremarkable.   LABORATORY: Electrolytes are fine. Bilirubin is 1.4, alkaline phosphatase 259, ALT 65, AST is 128. Serum Dilantin level is 5.4. Serum lipase is 1663. CBC within normal limits.   IMAGING: Chest x-ray is unremarkable.   ASSESSMENT AND PLAN: The patient with abdominal pain, nausea, vomiting, diarrhea. Her lipase is elevated, although her abdominal examination is quite benign. We may be dealing with a mild case of acute pancreatitis. Viral gastroenteritis would be another possibility. She has no leukocytosis or fever. Overall, she appears comfortable, and as mentioned above, abdominal examination is quite benign. Will recommend clear liquid diet for now. Advance gradually as tolerated. Control of nausea and vomiting. If diarrhea persists, recommend stool studies. The patient has mentioned dark-colored vomitus and stool, although she has had numerous admissions with similar symptoms, and no significant GI pathology has been found. The patient's hemoglobin and hematocrit is normal, and therefore, GI bleeding is extremely unlikely. Will follow.    ____________________________ Jill Side, MD si:OSi D: 08/19/2012 09:12:00 ET T: 08/19/2012 09:32:10  ET JOB#: [id number]  cc: Jill Side, MD, <Dictator> [facility]  [facility] MD ELECTRONICALLY SIGNED 08/21/2012 11:10

## 2014-10-28 NOTE — Consult Note (Signed)
Brief Consult Note: Diagnosis: hematemesis versus hemoptysis, lower abdominal pain.   Patient was seen by consultant.   Consult note dictated.   Recommend to proceed with surgery or procedure.   Recommend further assessment or treatment.   Orders entered.   Comments: Please see full GI consult [phone].  Paitent admitted with abdominal pain.   Episode of small amount of hematemesis this am and possible last night.  No recurrent over the course of the day.  Recommend egd tomorrow.  Also recommend flexible sigmoidoscopy when clinically feasible however would like to do ct abd and pelvis beforehand. Further recs to follow.  Electronic Signatures: Loistine Simas (MD)  (Signed 02-Dec-14 21:59)  Authored: Brief Consult Note   Last Updated: 02-Dec-14 21:59 by Loistine Simas (MD)

## 2014-10-29 DIAGNOSIS — I10 Essential (primary) hypertension: Secondary | ICD-10-CM | POA: Diagnosis not present

## 2014-10-29 NOTE — Consult Note (Signed)
PATIENT NAME:  [name], BURKARD MR#:  [id number] DATE OF BIRTH:  1956/02/06  DATE OF CONSULTATION:  09/13/2013  REFERRING PHYSICIAN:  Molly Maduro, MD CONSULTING PHYSICIAN:  Epifanio Lesches, MD  REASON FOR CONSULTATION: This is a STAT consult for altered mental status.   HISTORY OF PRESENT ILLNESS: A 59 year old female patient admitted to surgical service  this morning for rectovaginal fistula. We are asked to see the patient for altered mental status. The patient has been having BMs through her vagina for about a month and she is admitted to surgical service for rectovaginal fistula.   This afternoon the patient was found lethargic and she was moved to ICU. The patient received a dose of Xanax 1 mg around 2:10. The patient also is on venlafaxine, Neurontin, and she received a dose of oxycodone 5 mg at 2:00 p.m. The rapid response team was called because the patient was lethargic. The patient also found to have low O2 saturations with 82% saturation on room air.   The patient was moved to ICU immediately and her blood gas showed a pH of 7.42, pCO2 of 41, and pO2 of 67. The patient's saturations were 94.8%. Initially she was on BiPAP. When I saw her, she was on BiPAP 10/5 with 30% FiO2. She was alert by the time I went there and was able to talk to me, said she has abdominal pain. Then after about 5 to 10 minutes, BiPAP was discontinued. The patient was able to communicate and tell me where she is.   As I know her from previous hospital stay, she is at her baseline. The patient's urine toxicology is ordered because there was some concern that her family members who  gave her narcotics,her recently, if they gave her any extra pain pills. The patient's temazepam is on hold at this time and also I have suspended her other medications, which are Effexor and also Xanax.   PAST MEDICAL HISTORY: Significant for history of recent admission in February. She was admitted from 20th to 23rd. She was admitted  at that time because of altered mental status secondary to polypharmacy and UTI. By the time of discharge, she was alert and oriented. She has history of hypertension, COPD, chronic pain syndrome with narcotic-seeking behavior, history of binge drinking, comes in with acute-on-chronic pancreatitis and history of seizures.   ALLERGIES: DILAUDID, IBUPROFEN, PENICILLIN, SULFA, CHOCOLATE, AND STRAWBERRIES.  SOCIAL HISTORY: Continued to smoke a pack per day and has a history of alcohol abuse. Has a husband at home.   FAMILY HISTORY: Significant for hypertension and hyperlipidemia.   CURRENT MEDICATIONS: 1.  IV fluids D5 normal saline with potassium chloride 100 mL/hour.  2.  Albuterol 2 puffs every four hours.  3.  Alprazolam 1 mg q.12 hours.  4.  Amitriptyline 25 mg at bedtime.  5.  Amlodipine 10 mg daily.  11.  Benadryl 25 mg q.4-6 hours p.r.n.  12.  The patient also is on enalapril at 2.5 mg IV q.4 hours.  13.  Hydroxyzine 25 mg p.o. t.i.d.  14.  Neurontin 300 mg p.o. b.i.d.  15.  Advair Diskus 250/50 one puff b.i.d.  16.  Keppra 500 mg q.12 hours.  17.  Metoprolol succinate ER 25 mg p.o. daily.  18.  Naproxen 220 mg p.o. every 8 hours p.r.n. for pain.  19.  Macrobid 100 mg p.o. b.i.d. at meals.  20.  She is on pantoprazole 40 mg p.o. b.i.d.  21.  She is on Dilantin 125 mg p.o.  t.i.d.  22.  She is on Phenergan 12.5 mg q.6 p.r.n. for nausea and vomiting.  23.  She is on venlafaxine 150 mg p.o. daily.  24.  Temazepam 30 mg at bedtime.  25.  Aldactone 50 mg p.o. t.i.d. with meals.  26.  She is on GoLYTELY preparation for colonoscopy tomorrow.  27.  OXYCODONE 5 MG Q.6 HOURS. THAT WAS ALLERGY. DISCONTINUE.   REVIEW OF SYSTEMS: The patient right now complains of abdominal pain. She is alert and oriented.  EYES: No blurred vision.  ENT: No tinnitus. No epistaxis.  RESPIRATORY: No cough. No trouble breathing.  CARDIOVASCULAR: No chest pain. No orthopnea. No PND.  GASTROINTESTINAL: Has  abdominal pain and also denies any nausea or vomiting.  GENITOURINARY: No dysuria.  ENDOCRINE: No polyuria or nocturia.  INTEGUMENTARY: No skin rashes.  MUSCULOSKELETAL: Complains of chronic abdominal pain and also back pain.  NEUROLOGIC: No numbness or weakness.  PSYCHIATRIC: No anxiety or insomnia.   PHYSICAL EXAMINATION:  VITAL SIGNS: Temperature 97.7, heart rate 92, blood pressure 102/70, sats 97% on 3 liters.  GENERAL: Alert and oriented at this time. Complains of abdominal pain.  HEAD: Atraumatic, normocephalic.  EYES: Pupils equally reacting to light. No conjunctival pallor.  NOSE: No nasal lesions. No drainage.  EARS: No drainage. No external lesions.  MOUTH: No lesions. No exudates.  NECK: Supple. No JVD. No carotid bruit.  RESPIRATORY: The patient is not using accessory muscles of respiration. No wheezing. No rales.  CARDIOVASCULAR: S1, S2 regular. No murmurs. PMI not displaced. The patient does not have peripheral edema.  GASTROINTESTINAL: Abdomen: Does have lower quadrant tenderness present around the suprapubic area. Bowel sounds are present and the patient has a midline abdominal scar present.  MUSCULOSKELETAL: Able to move extremities x4.  SKIN: Inspection is normal.  LYMPHATICS: No lymphadenopathy.  NEUROLOGIC: Right now is alert, awake, oriented. Cranial nerves II through XII intact. Power is 5/5 in upper and lower extremities. Sensation is intact. DTR pulses 2+ bilateral. PSYCHIATRIC: Mood and affect are within normal limits.   LABORATORY DATA: Shows a pH 7.42, pCO2 of 41, pO2 of 67, FiO2 32. The patient's sats are 94.8%. UA is hazy colored and 1+ bacteria and WBC 31. Glucose was 248.   ASSESSMENT AND PLAN:  90.  A 59 year old female with history of cirrhosis with history of encephalopathy, admitted to surgery for recurrence of colovaginal fistula. We had consulted for altered mental status. At this time, altered mental status reason is unclear. Will get ammonia levels  because she has history of hepatic encephalopathy. I will also hold her sedatives including her Xanax, temazepam, and her pain medication. Also obtain urine toxicology. The patient's ammonia level is back at this time which is 84, so we are going to continue the lactulose. The patient has hepatic encephalopathy with elevated ammonia. The patient right now is on lactulose. Continue lactulose 30 mL b.i.d. and hold all the sedatives and see how she does.  2.  Hypoxia on room air. Continue oxygen 3 L and wean off as needed. Check the chest x-ray to evaluate for a pneumonia.  3.  Seizure disorder. Continue her Dilantin and Keppra.  4.  Colovaginal fistula. The patient seen by gastroenterology. The patient is not stable to have a prep for colonoscopy. Further management accordingly.  5.  History of abdominal pain and chronic pancreatitis. The patient's pain medications are on hold because of her lethargy.   6.  Hypertension, now hypotensive. Hold the blood pressure medications. Continue with  the fluids.  7.  History of chronic obstructive pulmonary disease. No wheezing.  8.  The patient's other diagnoses include history of Escherichia coli urinary tract infection. She is on Macrobid. Continue. Urology is already consulted.  9.  History of chronic obstructive pulmonary disease. No wheezing. Continue her Advair .   TOTAL TIME SPENT: About 60 minutes.   ____________________________ Epifanio Lesches, MD sk:np D: 09/13/2013 17:14:45 ET T: 09/13/2013 18:19:25 ET JOB#: [id number]  cc: Epifanio Lesches, MD, <Dictator> Epifanio Lesches MD ELECTRONICALLY SIGNED 09/20/2013 12:06

## 2014-10-29 NOTE — Consult Note (Signed)
Date of consultation: 09-14-2013  Patient name: [name]  Reason for consultation: ColovesicalColovaginal fistula, recent urinary tract infection with sepsis.  History:     Ms. Dail is a 59 year old (American female admitted in February with urosepsis.  She shortly thereafter developed stool through the vagina.  She has undergone evaluation by Dr. Leanora Cover.  She has had a previous colovaginal esical fistula repaired a number of years prior.  She also has multiple other medical issues contributing to her health status.  She was admitted with the plan to proceed with colovaginal vesical fistula repair and/or diverting colostomy.  She has had a colostomy in the past with the initial colovesical fistula.  She became unresponsive shortly after hospitalization.  She has a history of pain medication abuse and alcohol abuse.  It is unclear if this plate any role in overall mental status.  She is undergoing workup from a GI and GYN standpoint.  Urological consultation has been requested due to the recent episode of sepsis.  A review of her previous records and recent CT scan demonstrates no renal abnormalities.  There was no evidence of stone, mass, or obstruction.  The bladder demonstrated no abnormal wall thickening or filling defects.  She has been evacuating stool from her vagina for at least one month.  The presence of stool within the vagina is a significant risk factor for urinary tract infection.  I suspect that this is the sole source for the infection.  Repeat cultures have been requested based on her prior history.  The urine culture was obtained via catheterized specimen.  She is now much more awake and alert.  She was able to respond to questions appropriately.  She has had prior bladder infections.  Further evaluation with cystoscopy would still be recommended in this situation.  Past medical history: Colovesical fistula, seizure disorder, meningioma, recent urosepsis, hypertension, COPD,  pain medication abuse, alcohol abuse, chronic pancreatitis, chronic abdominal pain, pneumonia, hepatitis C, coronary artery disease, cirrhosis, obesity, depression, paranoid schizophrenia.  Past surgical history: Craniotomy, colovaginal vesical fistula repair, knee replacement surgery, hysterectomy, diverting colostomy, appendectomy, cholecystectomy.  Social history: Alcohol abuse, 1 pack per day cigarette use.  Allergies: Penicillin, sulfa, Dilaudid, ibuprofen, chocolate, strawberries.  Medications on admission: Zolpidem 10 mg at bedtime, Vesicare 10 mg daily, venlafaxine extended release 150 mg 1 tab daily, temazepam 30 mg daily, spironolactone 50 mg 3 times daily, promethazine 25 mg every 6 hours, pro-air 2 puffs 4 times daily, oxycodone 5/325 one tablet every 6 hours as needed, omeprazole 40 mg twice daily, Naprosyn 1 tab every 8 hours as needed, metoprolol ER 25 mg 1 tablet daily, lactulose 30 mL twice daily, Keppra 500 mg twice daily hydroxyzine 1 tablet 3 times daily, gabapentin 300 mg 1 tablet twice daily, fluticasone-salmeterol 250 g 1 puff 2 times daily, Benadryl 25 mg every 4-6 hours as needed, Dilantin 25 mg oral suspension 5 mL 3 times daily Creon to 3 times daily, Combivent 100 g 1 puff 2 times daily, amlodipine 10 mg 1 tablet daily amitriptyline 25 mg 1 tablet at bedtime, alprazolam 1 mg 1 tablet twice daily, albuterol 1.25 mg per 3 mL's 3 mL inhaled 3 times daily.  Family history: Hypertension, hyperlipidemia  Physical examination:   Temp: 98.2 Heart rate: 84  BP: 103/70  Respiratory rate: 23  Gen.: Awake, alert, responds appropriately to questions. HEENT: Within normal limits Chest: Clear to auscultation bilaterally. Cardiovascular: Regular rate and rhythm. Abdomen: Obese, soft, nontender, no palpable masses, no appreciable CVA tenderness  Genital exam: Deferred Extremities: Free range of motion 4 Neuro: Motor and sensory grossly intact  Assessment: Chronic cystitis,  colovesical fistula  Recommendation: The patient would likely benefit from further urological evaluation from the chronic cystitis standpoint.  The presence of a colovaginal vesical fistula however will lead to more infections until diversion is achieved for the fistula repaired.  The presence of stool in and of itself within the vagina significantly increases the risk of infection.  She would benefit from cystoscopy on an outpatient basis.  She was originally scheduled for follow-up with Dr. Elnoria Howard.  It is recommended that she follow-up with Dr. Elnoria Howard on an outpatient basis for further evaluation.  There is no indication for any acute urological intervention at this time.  Given the patient's multiple health issues, [facility].  At minimum, a diverting colostomy may be most appropriate.  If there are any questions, please feel free to contact us.  We will follow with you and make any further recommendations if indicated.     Electronic Signatures: Murrell Redden (MD) (Signed on 10-Mar-15 12:42)  Authored   Last Updated: 10-Mar-15 12:44 by Murrell Redden (MD)

## 2014-10-29 NOTE — Consult Note (Signed)
Brief Consult Note: Diagnosis: 59 yr old female with h/o cirrhosis ,copd,seizure disorder,chronic pancreatis  admitte dto surgery for colovaginal fistula,found to have hypoxia/lethargy/hypotension.   Patient was seen by consultant.   Consult note dictated.   Recommend further assessment or treatment.   Orders entered.   Comments: 59 yr old female with AMS likely due to hepatic encephalopathy; already on lactulose,hold all  other sedatives,watch in  ccu step down closely 2.acute  respiratory failure due to hypoxia probably due to sedative effect from xanax and oxycodone ;now sats are beter,ABG is not  showing hypercapniea,wean off bipap,continue home meds for chronic obstructive pulmonary disease,check xray chest 3.colovaginal fistula;management as per surgery.  Electronic Signatures: Epifanio Lesches (MD)  (Signed 09-Mar-15 17:23)  Authored: Brief Consult Note   Last Updated: 09-Mar-15 17:23 by Epifanio Lesches (MD)

## 2014-10-29 NOTE — H&P (Signed)
PATIENT NAME:  [name], [name] MR#:  [id number] DATE OF BIRTH:  [birth year]  DATE OF ADMISSION:  08/27/2013  PRIMARY CARE PHYSICIAN: [name], MD  EMERGENCY DEPARTMENT REFERRING PHYSICIAN: Ahmed Prima, MD  CHIEF COMPLAINT: Altered mental status, fever.   HISTORY OF PRESENT ILLNESS: The patient is a 59 year old, obese African American [name] with history of COPD, depression, chronic abdominal pain, history of craniotomy for meningioma, who was admitted multiple times last year for abdominal pain, sepsis, pneumonia, who is brought in by her mother because of lethargy. The patient since yesterday has been very sleepy, and when she talks she has been whispering. She also has had fevers yesterday as well. The patient came to the ED and was very weak. They could not get her out of the bed. She is very lethargic and continues to whisper. It is hard to understand her. In reviewing her chart and her outpatient medications, she is on multiple sedating medications including alprazolam, amitriptyline, Effexor, promethazine, temazepam, and Ambien. The patient does whisper that she has abdominal pain. Has a history of having chronic abdominal pain. She is otherwise unable to give me any further history. Her past medical history was obtained from her previous chart.   PAST MEDICAL HISTORY:  1.  History of chronic pancreatitis. 2.  Chronic pain syndrome.  3.  Hypertension. 4.  COPD. 5.  Seizure disorder. 6.  History of craniotomy for meningioma.  7.  Morbid obesity.  8.  Narcotic drug-seeking behavior. 9.  History of hepatitis C.  10.  Cirrhosis.  11.  Coronary artery disease. 12.  Tobacco abuse.   SOCIAL HISTORY: Continues to smoke around 1/2 pack per day. Remote history of alcohol abuse, none now. Lives with her husband at home.   FAMILY HISTORY: Hypertension and hyperlipidemia.   ALLERGIES: SHE IS ALLERGIC TO DILAUDID, IBUPROFEN, PENICILLIN, SULFA, CHOCOLATE, STRAWBERRIES, AND TOMATOES.    CURRENT MEDICATIONS: As an outpatient: Alprazolam 1 mg 1 tab p.o. b.i.d., amitriptyline 50 at bedtime, amlodipine 10 daily, Creon 12,000 units 2 caps t.i.d., Effexor XR 75 daily, fluticasone/Solu-Medrol 250/50 one puff b.i.d., gabapentin 300 one tab p.o. b.i.d., Keppra 500 one tab p.o. b.i.d., metoprolol succinate 25 daily, omeprazole 40 mg 1 tab p.o. b.i.d., ProAir 2 puffs 4 times a day as needed, promethazine 12.5 q.6 p.r.n., spironolactone 50 one tab p.o. t.i.d.. temazepam 30 at bedtime, VESIcare 10 mg daily, Ambien 10 at bedtime.  REVIEW OF SYSTEMS: Unobtainable due to the patient's lethargy,   PHYSICAL EXAMINATION: VITAL SIGNS: Temperature 97.8, pulse 98, respirations 22, blood pressure 129/90, O2 dropped to 88 on room air.  GENERAL: The patient is a morbidly obese African American [name], barely is able to keep her eyes open. She has pursed lip breathing.  HEENT: Head atraumatic, normocephalic. Pupils equally round, reactive to light and accommodation. There is no conjunctival pallor. No scleral icterus. Nasal exam shows no drainage or ulceration. Oropharynx is clear without any exudate. Mouth is very dry.  NECK: Supple without any JVD.  CARDIOVASCULAR: Regular rate and rhythm. No murmurs, rubs, clicks, or gallops. PMI is not displaced.  LUNGS: Clear to auscultation bilaterally without any rales, rhonchi, wheezing.  ABDOMEN: Soft, nontender, nondistended. Positive bowel sounds x 4. There is no hepatomegaly.  GENITOURINARY: Deferred.  MUSCULOSKELETAL: There is no erythema.  SKIN: Dry. No petechiae or rash.  NEUROLOGICAL: Moves all extremities but is very drowsy. Reflexes 2+. Cranial nerves II through XII grossly appear intact.  LYMPHATICS: No lymph nodes palpable.  LABORATORY AND RADIOLOGICAL  DATA: Glucose 150, BUN 9, creatinine 0.97, sodium 140, potassium 3.6, chloride 107, CO2 is 26, calcium 9.1. LFTs: Total protein 8.9, albumin 3.1, bilirubin total is 0.8, alkaline phosphatase 194, AST  is 66, ALT 44. TUDS positive for benzo, tricyclics positive. WBC 6.4, hemoglobin 11.7, platelet count 155. Influenza A and B were negative. Urinalysis shows 3+ bacteria, leukocyte esterase negative but nitrites positive. ABG with pH of 7.44, pCO2 of 38, pO2 of 57. CT scan of the head without contrast shows no acute abnormality, previous craniotomy on the left, atrophy. Chest x-ray shows no acute cardiopulmonary processes, minimal basilar atelectasis.   ASSESSMENT AND PLAN: The patient is a [name] with history of recurrent abdominal pain, admitted multiple times with various reasons, who is brought in by mother for lethargy, fever.  1.  Acute encephalopathy, possibly multifactorial including possibly due to dehydration, also could be related to a urinary tract infection. Also, she is on multiple medications that could be sedating, causing her to be sedated. At this time, I am going to go ahead and get a stat ammonia level. Treat her with IV fluids. Obtain a urine culture and treat her with ceftriaxone. If no improvement in her mental status, then would consider MRI of the brain and a neurology evaluation.  2.  Liver cirrhosis due to hepatitis C. Monitor her.  3.  History of chronic obstructive pulmonary disease with some, hypoxia. We will treat her with nebulizers due to possible exacerbation. We will also treat her with IV Solu-Medrol.  4.  Anxiety and chronic pain. Hold all sedating medications and pain medications.  5.  History of seizure disorder. I will give her her Keppra IV.  6.  Possible urinary tract infection. We will give her IV ceftriaxone. Follow her urine cultures.  7.  Morbid obesity.  8.  Miscellaneous: We will place her on heparin for deep vein thrombosis prophylaxis.    TIME SPENT: 55 minutes.     ____________________________ [name], MD shp:jcm D: 08/27/2013 14:24:02 ET T: 08/27/2013 15:00:34 ET JOB#: [id number]  cc: Altovise Wahler H. Posey Pronto, MD,  <Dictator> Alric Seton MD ELECTRONICALLY SIGNED 09/10/2013 17:56 Alric Seton MD ELECTRONICALLY SIGNED 09/10/2013 17:56

## 2014-10-29 NOTE — Op Note (Signed)
PATIENT NAME:  [name], PENALOZA MR#:  [id number] DATE OF BIRTH:  1955/08/18  DATE OF PROCEDURE:  08/05/2013  PREOPERATIVE DIAGNOSIS: Left volar wrist ganglion cyst.   POSTOPERATIVE DIAGNOSIS: Left volar wrist ganglion cyst.    PROCEDURE: Excision cyst, left wrist.   ANESTHESIA: General.   SURGEON: Laurene Footman, M.D.   DESCRIPTION OF PROCEDURE: The patient was brought to the operating room and after adequate anesthesia was obtained, the left arm was prepped and draped in the usual sterile fashion, with appropriate patient identification and timeout procedures completed. The tourniquet was raised to 250 mm at the start of the case. A curvilinear incision was made over the ganglion cyst approximately 3 cm in length. The skin and subcutaneous tissue were elevated, and the radial artery and associated veins laid directly over the ganglion cyst. They were gently dissected free and retracted radially. The cyst was dissected down to its base and the cyst removed without any apparent damage to the radial artery. At this point, the base appeared to arise from the radioscaphoid joint, and cautery was used to abrade this area and to try to cause scar tissue. After excision of the cyst and excising its origin at the capsular level, the wound was irrigated and then the tourniquet was let down to make sure there was no significant arterial bleeding. There was none. There was a good pulse and good circulation to the hand. The wound was then closed with simple interrupted 4-0 nylon for the skin. Xeroform, 4 x 4, Webril and volar splint were applied, followed by an Ace wrap. Total tourniquet time was 11 minutes at 250 mmHg.   SPECIMEN: Volar ganglion cyst, left wrist.   ____________________________ Laurene Footman, MD mjm:gb D: 08/05/2013 94:85:46 ET T: 08/06/2013 01:38:53 ET JOB#: [id number]  cc: Laurene Footman, MD, <Dictator> Laurene Footman MD ELECTRONICALLY SIGNED 08/06/2013 8:11

## 2014-10-29 NOTE — Consult Note (Signed)
PATIENT NAME:  [name], [name] MR#:  [id number] DATE OF BIRTH:  03/15/1956  DATE OF CONSULTATION:  09/13/2013  REFERRING PHYSICIAN:  William F. Marterre, MD CONSULTING PHYSICIAN:   [name]. 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   HISTORY OF PRESENT ILLNESS: [name] is a 59-year-old female who is known to me from previous office visit in December 2014 where she had chronic diarrhea. She also has a history of chronic abdominal pain, chronic pancreatitis, chronic hepatitis C untreated, and GERD. It had been advised for her to have a stool test done for C. difficile and infectious etiology; however, she never returned the stool kit. She was later seen by Dr. Marterre and gave history of vaginal pain and formed stool from her vagina. 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 Last attempted EGD aborted in December 2014 due to desaturation on sedation by [name]. She did have an EGD in August 2014 that showed a duodenal polyp and was otherwise normal. CT scan in December 2014 with IV contrast of her abdomen and pelvis showed no acute findings, prior cholecystectomy, cirrhosis, stable left adrenal nodule. She was admitted for pneumonia in December 2014 and therefore, colonoscopy and further endoscopic evaluation was postponed at that time. Today she has been admitted to the hospital and has not received any medications here, and she has been difficult to arouse by the staff. I spoke with Dr. Marterre, and we both presented to her room in examination. She responds to verbal and tactile stimuli, although she is unable to answer my questions.   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 She had an EGD August 2014 by [name]: Duodenal polyp, otherwise normal. Failed EGD in December 2014 for desaturation. EGD in September 2013: Chronic gastritis and duodenal polyp. Seizure disorder, insomnia, COPD, UTI, depression, total knee replacement, craniotomy, cholecystectomy, appendectomy, and partial hysterectomy in 1989.   MEDICATIONS PRIOR TO ADMISSION: Albuterol 3 mL t.i.d., alprazolam 1 mg b.i.d., amitriptyline 25 mg at bedtime, amlodipine 10 mg q.a.m., Combivent inhaler 100/20 mcg 1 puff b.i.d., Creon 12/38/60 two caps t.i.d., Dilantin 25 mg/mL 5 mL t.i.d., diphenhydramine 25 mg 1 tablet every 4 to 6 hours p.r.n., fluticasone/salmeterol 250/50 mcg 1 puff b.i.d., gabapentin 300 mg b.i.d., hydroxyzine hydrochloride 25 mg t.i.d., Keppra 500 mg b.i.d., lactulose 10 grams/15 mL 30 mL b.i.d., Macrobid 100 mg b.i.d., metoprolol 25 mg daily, naproxen 220 mg q.8 hours p.r.n., omeprazole 40 mg b.i.d., oxycodone 5 mg q.6 hours, ProAir HFA 2 puffs q.i.d., promethazine 12.5 mg q.6 hours p.r.n., promethazine 25 mg q.6 hours p.r.n., spironolactone 50 mg t.i.d., temazepam 30 mg at bedtime, venlafaxine extended-release 150 mg daily, VESIcare 10 mg daily, zolpidem 10 mg at bedtime.   ALLERGIES: DILAUDID: RASH. IBUPROFEN: THE PATIENT STATES SHE CANNOT TAKE. PENICILLIN: HIVES. SULFA: HIVES. CHOCOLATE: HIVES, RASH. STRAWBERRIES: HIVES.   FAMILY HISTORY: Obtained from previous visit. Father has diabetes. There is no known family history of colorectal carcinoma, liver or chronic GI problems.   SOCIAL HISTORY: She denies any current use of alcohol; she quit in 2013. She is a current everyday smoker. She denies any illicit drug use.   REVIEW OF SYSTEMS: Unable to obtain, as the patient   is not answering my questions today.   PHYSICAL EXAMINATION: VITAL SIGNS: Temperature 98.6, pulse 91, respirations 21, blood pressure  120/78, O2 sat 97% on 3 liters via nasal cannula.  GENERAL: She is a morbidly obese black female who is resting quietly in the bed. She is difficult to arouse; however, with sternal rub, she does awaken and briefly mumbles answers to the questions. She is able to open her eyes, but unable to obtain a history from her today. Dr. Marterre is at the bedside as well.  HEENT: Sclerae are clear, anicteric. Conjunctivae are pink. Oropharynx is pink and moist without any lesions.  NECK: Supple without any mass or thyromegaly.  CHEST: Heart rate is regular without any murmurs, clicks, rubs, or gallops.  LUNGS: Clear to auscultation bilaterally with no acute distress.  ABDOMEN: Protuberant. She has positive bowel sounds x 4. There are no bruits auscultated. Abdomen is soft, nontender, nondistended. Exam is limited given body habitus.  EXTREMITIES: Without clubbing or edema.  NEUROLOGIC: She does awaken. She is not answering questions. Unable to get a complete neuro exam on patient.  SKIN: Warm and dry without diaphoresis, rash, cyanosis, or jaundice.   LABORATORY STUDIES: Have been ordered, but results unavailable.   IMPRESSION: [name] is a 59-year-old, obese black female known to me from December 2014 visit for chronic diarrhea with history of colovaginal fistula, status post colostomy and reversal in July 2007, cirrhosis with history of hepatic encephalopathy, recent pneumonia in December 2014, chronic obstructive pulmonary disease, coronary artery disease, hypertension, chronic pancreatitis, history of drug-seeking behaviors, and paranoid schizophrenia. She was admitted for possible colonoscopy tomorrow with [name], and if positive for colovaginal fistula, plans were to be made for Hartmann procedure Wednesday by Dr. Marterre. Since admission, the patient has been hypotensive and lethargic and is undergoing further evaluation for this. Labs ordered by Dr. Marterre include ammonia, metabolic panel, serum  alcohol, hemoglobin, urinalysis and culture, and urine drug screen. The patient is unable answer my questions, although she does respond to verbal and tactile stimuli. I have discussed her condition with both Dr. Marterre and [name]. Today, the patient is not a candidate for bowel prep and colonoscopy until she is more alert and awake, plus I am unable to go over risks and benefits for this elective procedure with her today. There are also no family members present.   PLAN: 1.  Will re-evaluate in the morning.  2.  Will review all labs as the database expands.  3.  Consider CT of abdomen and pelvis with IV and rectal contrast to evaluate for fistula if the patient is high risk for colonoscopy, sedation.  4.  Continue supportive measures.   Thank you for allowing us to participate in her care.  ____________________________  [name]. Lapid, NP klj:jcm D: 09/13/2013 17:14:01 ET T: 09/13/2013 17:48:37 ET JOB#: [id number]  cc:  [name]. Bethune, NP, <Dictator>  [name] Fenderson FNP ELECTRONICALLY SIGNED 09/17/2013 14:05 

## 2014-10-29 NOTE — Consult Note (Signed)
Chief Complaint:  Subjective/Chief Complaint Pt alert, oriented this morning.  Says she has been having multiple loose stools.  Also says she has to "dig out formed stool from her vagina".  Denies nausea, vomiting or abdominal pain.   VITAL SIGNS/ANCILLARY NOTES: **Vital Signs.:   10-Mar-15 04:10  Temperature Temperature (F) 98.7  Celsius 37  Temperature Source oral  Pulse Pulse 88  Respirations Respirations 24  Systolic BP Systolic BP 95  Diastolic BP (mmHg) Diastolic BP (mmHg) 58  Mean BP 70  Pulse Ox % Pulse Ox % 95  Pulse Ox Activity Level  At rest  Oxygen Delivery 2L  Pulse Ox Heart Rate 90   Brief Assessment:  GEN well developed, no acute distress, obese, A/Ox3   Cardiac Regular   Respiratory normal resp effort   Gastrointestinal details normal Soft  Nontender  Bowel sounds normal  No rebound tenderness  No gaurding  Exam limited given body habitus   EXTR negative edema, No cyanosis   Additional Physical Exam Skin: warm, dry & intact   Lab Results: Routine Chem:  10-Mar-15 04:09   Glucose, Serum  123  BUN  5  Creatinine (comp) 0.88  Sodium, Serum 142  Potassium, Serum 3.6  Chloride, Serum  110  CO2, Serum 26  Calcium (Total), Serum  8.4  Anion Gap  6  Osmolality (calc) 282  eGFR (African American) >60  eGFR (Non-African American) >60 (eGFR values <68mL/min/1.73 m2 may be an indication of chronic kidney disease (CKD). Calculated eGFR is useful in patients with stable renal function. The eGFR calculation will not be reliable in acutely ill patients when serum creatinine is changing rapidly. It is not useful in  patients on dialysis. The eGFR calculation may not be applicable to patients at the low and high extremes of body sizes, pregnant women, and vegetarians.)  Routine Hem:  10-Mar-15 04:09   WBC (CBC) 7.7  RBC (CBC) 4.11  Hemoglobin (CBC)  10.4  Hematocrit (CBC)  33.7  Platelet Count (CBC)  148  MCV 82  MCH  25.3  MCHC  30.9  RDW  18.7   Neutrophil % 50.9  Lymphocyte % 36.7  Monocyte % 8.5  Eosinophil % 3.3  Basophil % 0.6  Neutrophil # 3.9  Lymphocyte # 2.8  Monocyte # 0.7  Eosinophil # 0.3  Basophil # 0.0 (Result(s) reported on 14 Sep 2013 at 04:41AM.)   Assessment/Plan:  Assessment/Plan:  Assessment Chronic diarrhea:  Hx of colovaginal fistula s/p colostomy & reversal July 2007, evaluation for recurrent fistula this admission.  Hypoxia & hypotension improved since last night.  Pt much more alert & conversant. ? Hepatitis C/ETOH cirrhosis with encephalopathy:  Much more alert today on lactulose.  Never had Hep C treatment, quit ETOH 2013   Plan 1) Continue lactulose 2) colonoscopy with Dr Allen Norris tomorrow.   Discussed risks/benefits of procedure which include but are not limited to bleeding, infection, perforation & drug reaction.  Patient agrees with this plan & consent will be obtained. 3) continue supportive measures Please call if you have any questions or concerns   Electronic Signatures: Andria Meuse (NP)  (Signed 10-Mar-15 08:44)  Authored: Chief Complaint, VITAL SIGNS/ANCILLARY NOTES, Brief Assessment, Lab Results, Assessment/Plan   Last Updated: 10-Mar-15 08:44 by Andria Meuse (NP)

## 2014-10-29 NOTE — Consult Note (Signed)
Brief Consult Note: Diagnosis: Consider colonoscopy for possible colovaginal fistula recurrence.   Patient was seen by consultant.   Comments: Ms. Witte is a 59 y/o obese black female known to me from Dec 2014 visit for chronic diarrhea with hx of colovaginal fistula s/p colostomy & reversal July 2007, cirrhosis with hx encephalopathy, recent pneumonia (Dec 2014), COPD, CAD, HTN, chronic pancreatitis, hx drug-seeking behaviors & paranoid schizophrenia.  She was admitted for possible colonoscopy tomorrow & if positive for colovaginal fistula, plans were to be made for Hartman's procedure Wednesday by Dr Leanora Cover.  Since admission, pt has been hypotensive & lethargic & is undergoing further evaluation for this.  Labs ordered by Dr Leanora Cover include ammonia, metabolic panel, serum ETOH, UA/cx & UDS.  Patient is unable to answer my questions although she does respond to verbal & tactile stimuli.  I have discussed her condition with both Dr Leanora Cover & Dr Allen Norris.  Today, patient is not a candidate for bowel prep & colonoscopy until she is more alert & awake.  Plus, I am unable to go over risks/ benefits for this "elective" procedure.  There are also no family members present.  Plan: 1) Will re-evaluate in the AM  2) Will review all labs as the database expands 3) consider CT abd/pelvis with IV/rectal contrast to evaluate for fistula if pt high risk for colonoscopy sedation 4) supportive measures Thanks for allowing [place] to participate in her care.  Please see full dictated note. #[id number].  Electronic Signatures: Andria Meuse (NP)  (Signed 09-Mar-15 17:14)  Authored: Brief Consult Note   Last Updated: 09-Mar-15 17:14 by Andria Meuse (NP)

## 2014-10-29 NOTE — Discharge Summary (Signed)
PATIENT NAME:  [name], [name] MR#:  [id number] DATE OF BIRTH:  [birth year]  DATE OF ADMISSION:  08/27/2013 DATE OF DISCHARGE:  08/30/2013  PRIMARY CARE PHYSICIAN:  Debbora Dus, MD  DISCHARGE DIAGNOSES: 1.  Altered mental status secondary to polypharmacy and urinary tract infection.  2.  Metabolic encephalopathy, resolved.  3.  Hypertension.  4.  Chronic obstructive pulmonary disease.  5.  Chronic pain syndrome.  6.  Chronic pancreatitis.  7.  History of seizures.   DISCHARGE MEDICATIONS: 1.  Advair 250/50 one puff b.i.d.  2.  Omeprazole 40 mg p.o. b.i.d.  3.  Keppra 500 mg p.o. b.i.d.  4.  VESIcare10 mg p.o. daily.  5.  ProAir 90 mcg 2 puffs 4 times daily.  6.  Neurontin 300 mg p.o. b.i.d.  7.  Metoprolol succinate 25 mg p.o. daily.  8.  Effexor XR 75 mg p.o. daily.  9.  Amlodipine 10 mg p.o. daily.  10.  Aldactone 50 mg p.o. t.i.d.  11.  Temazepam 30 mg p.o. once a day at bedtime.  12.  Creon 12,000 units; 38,000 units; 60,000 units delayed-release capsule 2 capsules p.o. t.i.d.  13.  Amitriptyline 50 mg p.o. daily.  14.  Promethazine 12.5 mg p.o. q.6 hours.  15.  Xanax 1 mg p.o. b.i.d.  16.  Lactulose 10 grams, that is 30 mL p.o. b.i.d.  17.  Tramadol 50 mg p.o. q.6 hours p.r.n. for pain.  18.  Macrobid 100 mg capsules p.o. b.i.d.  The patient was given Macrobid until March 5, that is for 14 days.   She wanted me to give prescriptions for her ProAir and tramadol and VESIcare, so I gave prescription for tramadol until February 20th and only 20 tablets are given.   CONSULTATIONS: None.   HOSPITAL COURSE:  A 59 year old female patient who is well known to our practice for frequent admissions with narcotic-seeking behavior, came in because of altered mental status. The patient brought in by the mother because of lethargy and the patient's urine toxicology positive for benzos, tricyclics on admission and head CT did not show any acute changes. Chest x-ray was negative for any  infiltrate. She is admitted for encephalopathy secondary to dehydration and possible UTI and also polypharmacy. The patient's lipase was 146 on admission. She received IV fluids and started on IV Rocephin for UTI. The patient's mental status improved. By the next 2 days, she was alert and oriented and the patient wanted regular diet, started on regular diet. She wanted pain medications. Complains of abdominal pain all the time. Lipase was normal. The patient was eating regular diet, tolerating her diet well. Then when I go into the room, she starts to have nausea and says she has stomach pain, requesting multiple pain medications. When she was admitted because of her altered mental status, she did not receive any pain medications. The patient's primary doctor used to be Dr. Devona Konig.  Because of her nausea, Dr. Jacqualine Code was seeing her but he would not give any prescription for pain medications, so the patient comes to the hospital for these chronic pain problems even though her workup has been negative, tolerating the diet. Continuing to request the pain medications. The patient asked me to write 90 tablets of tramadol, which I did not write. I told her to follow up with her primary doctor. I asked the case manager to help with this situation. The patient was told that she needed to be compliant with medications and we cannot write prescriptions  for narcotics without a reason, and the patient was advised to follow up with her primary doctor regarding her ongoing pain issues and get the pain medications as indicated. The patient's other diagnosis included UTI, and the patient's urine cultures showed E. coli with more than 100,000 colonies sensitive to nitrofurantoin, resistant to Levaquin and also Cipro. The patient given Macrodantin and the patient's symptoms improved. The patient's influenza test was negative on admission.     DIAGNOSES: 1.  Chronic abdominal pain due to chronic pancreatitis. The patient  advised to take her Creon.   2.  The patient's altered mental status thought to be metabolic and toxic encephalopathy with a lot of meds on board. She was taking amitriptyline and also her Effexor has been changed to 150 mg daily just before she came, so I told her that maybe this may be too much for her. She can cut back to 75 mg, which she was taking before. So she will be on Effexor 75 mg daily and also amitriptyline that she has taken for years.  3.  History of seizure disorder. She is on Keppra and Dilantin. I have written prescriptions for both of these medications.  4.  Hypertension. The patient's blood pressure initially was a little borderline but improved with the fluids.  She is on amlodipine; that can be continued along with Aldactone.  5.  History of chronic obstructive pulmonary disease. Initially, she had wheezing so Dr. Dustin Flock started her on IV Solu-Medrol. I gave her prednisone 20 mg daily for 2 days and 20 mg 1 tablet daily for 2 days. I wrote that but the discharge instructions did not include that; somehow had a problem with putting that in. 6.  The patient has a history of constipation. She has lactulose that she can take.  7.  She wanted me to write a work note because she had a court appointment that she missed while she was here.  We wrote a work note from 20th February to 23rd February. 8.  The patient was seen by Levada Dy, case manager, and according to her, the patient's primary care physician now is Dr. Jacqualine Code; and Dr. Jacqualine Code will not give refills on narcotic medications, so the patient is advised to follow up with primary care physician regarding her further medications of narcotics.  This is the patient's primary care physician and probably she needs a chronic pain management referral for the ongoing pain issues.   TIME SPENT: More than 30 minutes.    ____________________________ Epifanio Lesches, MD sk:ce D: 09/01/2013 12:03:49 ET T: 09/01/2013 15:04:30  ET JOB#: [id number]  cc: Epifanio Lesches, MD, <Dictator> Epifanio Lesches MD ELECTRONICALLY SIGNED 09/02/2013 14:28

## 2014-10-29 NOTE — Discharge Summary (Signed)
PATIENT NAME:  [name], [name] MR#:  [id number] DATE OF BIRTH:  [birth year]  DATE OF ADMISSION:  09/13/2013 DATE OF DISCHARGE: 09/16/2013   PRINCIPLE DIAGNOSIS: Pelvic pain, not otherwise specified.   OTHER DIAGNOSES:  1.  Hypertension.  2.  Chronic obstructive pulmonary disease.  3.  Chronic pain syndrome.  4.  Opioid seeking behavior.  5.  History of binge alcoholism.  6.  History of questionable pancreatitis.  7.  History of seizures.  8.  Recent severe urosepsis due to Escherichia coli urinary tract infection.  9.  Paranoid schizophrenia. 10. Chronic active hepatitis C.  11. Chronic anemia.  12. Status post total knee replacement.  13. Status post craniotomy.  14. Status post cholecystectomy.  15. Status post appendectomy.  16. Status post hysterectomy.  17. Status post colectomy and colostomy for colovaginal fistula [which was never confirmed by intraoperative findings or pathology] (July 2006, Dr. Pat Patrick).  18. Status post colostomy takedown (July 2007, Dr. Rochel Brome).  19. Morbid obesity.   HOSPITAL COURSE: The patient was admitted to the hospital with plans for a bowel prep and colonoscopy to determine whether she had a colovaginal fistula or not as she had one by history. Within the first few hours of her hospitalization, she went into a metabolic encephalopathy and was transferred to the CCU overnight and she continued to have stable vital signs throughout that course and by the following morning had a normal mental status and was transferred back out to the floor. All of her drug screens were negative with the exception of tricyclic antidepressants, which she later admitted to taking too much of prior to her admission. Her alcohol levels were normal as well, but she did have a mildly elevated ammonia level. Her bowel prep was continued and she underwent colonoscopy by Dr. Allen Norris on March 11, which was entirely normal and there was no evidence of anything that might be causing a  colovaginal or rectovaginal fistula. The following day, she underwent CT scan of the abdomen and pelvis with IV and rectal contrast and that, as well, showed no evidence of colovaginal or rectovaginal fistula. Indeed, I reviewed the films myself including the coronal and sagittal images and there was no inflammation in the pelvis or any evidence suggesting a possible fistulous connection. The patient was informed of this and was understandably upset and again requested that multiple times that I refill her opioid analgesics. She also requested that I call Dr. Anselm Jungling, who was kind enough to visit her in the hospital as a consultant and I did so and he also refused. The patient is being discharged from the hospital and has no need for surgical followup, particularly with [facility].   ____________________________ Consuela Mimes, MD wfm:[id number] D: 09/16/2013 12:28:50 ET T: 09/16/2013 12:51:53 ET JOB#: [id number]  cc: Consuela Mimes, MD, <Dictator> Consuela Mimes MD ELECTRONICALLY SIGNED 09/17/2013 15:41

## 2014-10-30 DIAGNOSIS — I10 Essential (primary) hypertension: Secondary | ICD-10-CM | POA: Diagnosis not present

## 2014-10-30 NOTE — Discharge Summary (Signed)
PATIENT NAME:  [name], [name] MR#:  [id number] DATE OF BIRTH:  05-Sep-1955  DATE OF ADMISSION:  11/03/2011 DATE OF DISCHARGE:  11/07/2011  For a detailed note, please see the History and Physical done on admission by Dr. Bridgette Habermann.   DIAGNOSES AT DISCHARGE:  1. Acute pancreatitis secondary to alcohol abuse.  2. History of alcohol abuse.  3. History of seizures.  4. History of chronic pancreatitis.  5. Gastroesophageal reflux disease.  6. Depression.  7. Hypertension.  8. Chronic liver disease.  9. Urinary tract infection.  DIET: The patient is being discharged on a low-sodium diet.   ACTIVITY: As tolerated.   FOLLOWUP:  Follow up with Dr. Gaylyn Cheers in the next 3 to 4 weeks.   DISCHARGE MEDICATIONS:  1. Famotidine 20 mg b.i.d.  2. Keppra 500 mg b.i.d.  3. Combivent 2 puffs q. 6 hours as needed.  4. Spiriva 1 puff daily.  5. Lexapro 20 mg daily.  6. Creon 12,000 units, 2 tabs t.i.d. with meals.  7. Dilantin 9 mL b.i.d.  8. Metoprolol succinate 25 mg daily.  9. Omeprazole 40 mg b.i.d.  10. Amitriptyline 50 mg at bedtime.  11. Advair 250/50 1 puff b.i.d.  12. Amlodipine 10 mg daily.  13. Albuterol ipratropium nebulizer q. 4 hours as needed.  14. Seroquel 350 mg at bedtime.  15. Aldactone 100 mg daily.  16. Ceftin 5 mg b.i.d. times three days. 17. Tramadol 50 mg q. 6 hours p.r.n.    [place] COURSE:  Dr. Gaylyn Cheers, gastroenterology.   LABORATORY, DIAGNOSTIC, AND RADIOLOGICAL DATA:  Abdomen 3-way done on admission showed unremarkable abdominal series. Ultrasound of the abdomen done on 04/29 showed no acute changes. The patient is status post cholecystectomy. The pancreas is not visualized adequately.   HOSPITAL COURSE: This is a 59 year old female with multiple medical problems as mentioned above who presented to the hospital on 11/03/2011 due to abdominal pain, nausea, and vomiting, and likely with acute pancreatitis.  1. Acute pancreatitis:  This was likely acute on chronic pancreatitis. The patient has a history of chronic liver disease. When the patient presented to the Emergency Room her lipase was as high as 1300. It peaked as high as 2200. The patient was managed aggressively with IV fluids, antiemetics, and pain control. Abdominal ultrasound was obtained which showed no acute pathology. Her pancreatitis was likely related to an alcoholic binge.  Her diet was slowly advanced as her pain improved from a clear liquid eventually to a low-fat diet, which she has been able to tolerate now. Her lipase is now down to 800 and therefore she is being discharged home. She will have close followup with gastroenterology as an outpatient.  2. Abnormal liver function tests: This was likely secondary to chronic liver disease and alcohol abuse, and this likely further needs to be followed by GI as an outpatient. The patient was strongly advised to abstain from alcohol.  3. Urinary tract infection: The patient was empirically started on Levaquin on admission. Her urine culture grew out Escherichia coli, which was resistant to Levaquin. Therefore, she was switched over to ceftriaxone, even though she has a penicillin allergy. She tolerated the ceftriaxone well and therefore she is being discharged on p.o. Ceftin for a few more days.  4. Chronic pain: The patient does have some drug-seeking behavior. I did not prescribe any narcotics. I did send her home on some tramadol as needed.  5. Depression: The patient was maintained on Lexapro. She will  continue that.  6. History of seizures: The patient was maintained on her Keppra and Dilantin. She had no acute seizure activity. She will continue that.  7. Chronic obstructive pulmonary disease: There was no evidence of any acute exacerbation. She will continue her p.r.n. nebulizer treatments, and her Spiriva and Advair inhalers.   The patient apparently does not have a primary care physician. She has been fired by  practices around this area including Dr. Clayborn Bigness and also Dr. Elijio Miles.  Dr. Vira Agar is willing to see the patient in the next 2 to 3 weeks. The patient has also been made a referral to [facility] primary care, and she is to find herself a primary care physician. I did refill her scrips for up to a month, as Dr. Vira Agar planned on seeing her within 2 to 3 weeks.   TIME SPENT ON DISCHARGE: 45 minutes.    ____________________________ Belia Heman. Verdell Carmine, MD vjs:bjt D: 11/07/2011 16:08:50 ET T: 11/08/2011 11:06:53 ET JOB#: [id number]  cc: Belia Heman. Verdell Carmine, MD, <Dictator> Manya Silvas, MD Henreitta Leber MD ELECTRONICALLY SIGNED 11/15/2011 18:48

## 2014-10-30 NOTE — Consult Note (Signed)
Pt with resolution of her upper abd pain and has been discharged by Hospitalist.  She is on several meds but is requesting something for pain and Dr. Verdell Carmine will write for Ultram and I will see her in 2-3 weeks for follow up her liver and pancreas problems  Electronic Signatures: Manya Silvas (MD)  (Signed on 02-May-13 14:39)  Authored  Last Updated: 02-May-13 14:39 by Manya Silvas (MD)

## 2014-10-30 NOTE — H&P (Signed)
PATIENT NAME:  [name], [name] MR#:  [id number] DATE OF BIRTH:  1956/04/11  DATE OF ADMISSION:  11/03/2011  REFERRING PHYSICIAN: Dr. Lenise Arena    PRIMARY CARE PHYSICIAN: Previously at [facility], currently has no PCP.   CHIEF COMPLAINT: Abdominal pain, nausea, vomiting.   HISTORY OF PRESENT ILLNESS: The patient is a [name] with history of seizure disorder, history of brain tumor, alcoholic cirrhosis with portal hypertensive gastropathy, and chronic obstructive pulmonary disease who presents with the above symptoms. The patient stated that her symptoms started three days ago with multiple episodes of nausea, vomiting, and brownish stool. She has had also epigastric abdominal pain and has had poor p.o. intake. The patient feels like this is again her pancreatitis. The vomitus is brown and currently is clear as she has had decreased p.o. intake. The patient had two episodes of vomiting this morning and four episodes yesterday. She also states she had some chronic shortness of breath. On arrival she was noted to have lipase which was elevated at 1330. The hospitalist service was contacted for further evaluation and management.   PAST MEDICAL HISTORY:  1. 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 2. Seizure disorder. 3. Hepatitis C. 4. Alcoholic cirrhosis. 5. Anxiety. 6. Depression. 7. Portal hypertensive gastropathy.  8. Hypertension.  9. Hiatal hernia. 10. Chronic obstructive pulmonary disease. 11. Osteoporosis. 12. Coronary artery disease. 13. History of cerebrovascular accident. 14. History of medication noncompliance. 15. History of epistaxis.  16. Schizophrenia. 17. History of chronic pancreatitis.   PAST SURGICAL HISTORY:   1. Appendectomy. 2. Cholecystectomy. 3. Craniotomy. 4. Rectovaginal fistula repair. 5. History of colostomy. 6. Kneecap surgery.   ALLERGIES: Penicillin, chocolate, ibuprofen, intolerant to Tylenol given her hepatic function, sulfa, and Lasix.   MEDICATIONS: 1. Advair 250/50 inhaled twice daily.  2. Albuterol nebulizer 4 times a day. 3. Amitriptyline 50 mg at bedtime.  4. Famotidine 20 mg twice a day.  5. Keppra 500 mg twice a day. 6. Lexapro 20 mg daily.  7. Omeprazole 40 mg twice a day. 8. Seroquel 350 mg extended-release nightly.  9. Spironolactone 50 mg t.i.d.  10. Phenytoin liquid 125/5 mL 9 mL b.i.d.  11. Pancrelipase 1200/38,000,600,000 two tabs t.i.d.  12. Amlodipine 10 mg daily.  13. Metoprolol XL 25 mg daily. 14. Spiriva 18 mcg inhaled daily.  15. Combivent as needed. 16. Lorazepam 1 mg t.i.d. p.r.n.   Of note, the patient states that she has run out of most of her medications for 1 to 2 weeks as she is in transition to get a new primary care physician.   FAMILY HISTORY: Mother with brain tumor and hypertension. father with MI.   SOCIAL HISTORY: Lives with her husband. Smokes a pack of cigarettes a day. Drinks a case of beer a day and up to two cases on the weekends. Denies any other drugs.   REVIEW OF SYSTEMS: CONSTITUTIONAL: States she had a fever of 102. Some chills. No sweats. Some dizziness. EYES: No decreased vision. ENT: No tinnitus or hearing loss. No snoring. RESPIRATORY: COPD and chronic shortness of breath. No painful respiration. Has a cough with occasional blood-tinged sputum. GI: Nausea, vomiting, and diarrhea as above. Abdominal pain mostly in the epigastrium. No hematemesis. No black tarry stools. No melena. GU: Has some dysuria and  increased frequency. Urine is more dark than prior. MUSCULOSKELETAL: Chronic arthritis. SKIN: Has itching. NEUROLOGIC: History of seizures. Last seizure was greater than a week ago. PSYCH: Anxiety, depression, and schizophrenia  per patient.   PHYSICAL EXAMINATION:   VITAL SIGNS: On arrival, temperature 99.7, pulse rate 120, respiratory rate 18, blood pressure 137/74, oxygen sat 98% on room air.   GENERAL: The patient is an obese African American female laying in bed. No obvious distress. Talking in full sentences.   HEENT: Normocephalic, atraumatic. Pupils are equal and reactive. Injected sclerae. Extraocular muscles intact. Moist mucous membranes.   NECK: Supple. No thyroid tenderness. No lymphadenopathy.   CARDIOVASCULAR: S1, S2, tachycardic. No murmurs, rubs, or gallops.   LUNGS: Clear to auscultation without wheezing, rhonchi, or rales.   ABDOMEN: Soft. Epigastric tenderness to palpation. Hyperactive bowel sounds. No rebound or guarding.   EXTREMITIES: No significant lower extremity edema. No tremor.   NEUROLOGIC: Cranial nerves II through XII grossly intact. Strength 5 out of 5 in all extremities. Sensation intact to light touch.   PSYCH: Awake, alert, oriented x3, cooperative.   LABORATORY, DIAGNOSTIC, AND RADIOLOGICAL DATA: Glucose 114, creatinine 0.8, sodium 138, potassium 3.6, lipase 1330. Ethanol less than 0.003%. LFTs total protein 8.7, albumin 3.5, 1.8, alkaline phosphatase 159, AST 189, ALT 133. WBC 6.4, hemoglobin 13.6, hematocrit 41.9, platelets 172. INR 1.1. Urinalysis 2+ bilirubin, trace ketones, no nitrites or leukocyte esterase, 3+ bacteria.  Three-way abdomen includes PA of the chest showing unremarkable abdominal series.   EKG sinus tachycardia at rate of 112. No acute ST elevations or depressions.   ASSESSMENT AND PLAN: We have a [name] with history of seizure disorder, Hepatitis C, alcoholic cirrhosis with ongoing alcohol use, and history of chronic pancreatitis who presents with acute on chronic pancreatitis, possible urinary tract infection, elevated LFTs.   We would admit the patient for pain control and IV fluid resuscitation given her abdominal pain,  nausea, vomiting, and elevated lipase which is consistent with acute on chronic pancreatitis. This likely is in the setting of alcohol use. The patient has been drinking, however, she said she has tried to cut down and has not drank anything in several days. Start the patient on morphine p.r.n. for pain control as well as Zofran for nausea and vomiting and IV fluids. The patient states she can take her pills orally and I will make the patient n.p.o. except meds. Would check an abdominal ultrasound as well and we can also evaluate the liver and biliary tree with this. I would place the patient on CIWA protocol. Will also check a lipase in the morning. Alcohol cessation was discussed with the patient the patient states she has been trying to cut down. She does have some dysuria and some increased frequency as well as bacteria in her urine although WBC is not significantly elevated in the urine. I would start the patient on Levaquin for now and obtain urine cultures.   For seizure disorders, I would continue the Keppra and phenytoin. The patient states she has run out of her phenytoin and her last seizure was greater than a week ago.   In regards to her COPD, I would continue the outpatient medications. She does not appear to have an acute exacerbation of her COPD.   In regards to the astrocytoma which was resected, she states this has recurred. Per last hospitalization in January a CT of the head with and without contrast was performed which did not show recurrence. An MRI  of the brain was ordered, however, she was noncompliant with that in the hospital and it was not done. She has a stable neuro exam currently. This could be followed up as an outpatient.   In regards to her tobacco abuse, I would place her on a patch and smoking cessation was counseled by me for greater than three minutes. I would hold the spironolactone given the need for IV fluid resuscitation and continue her psych meds.   Start her on  SCDs and TEDs as well as PPI b.i.d.   TOTAL TIME SPENT: 55 minutes.   CODE STATUS: The patient is FULL CODE.   ____________________________ Vivien Presto, MD sa:drc D: 11/03/2011 18:44:07 ET T: 11/04/2011 06:32:01 ET JOB#: [id number]  cc: Vivien Presto, MD, <Dictator> Vivien Presto MD ELECTRONICALLY SIGNED 11/12/2011 12:09

## 2014-10-30 NOTE — Consult Note (Signed)
PATIENT NAME:  [name], SCHLEICH MR#:  [id number] DATE OF BIRTH:  [birth year]  DATE OF CONSULTATION:  11/04/2011  REFERRING PHYSICIAN:  Lenise Arena, MD  CONSULTING PHYSICIAN:  Gaylyn Cheers, MD/Christalynn Boise Ruthe Mannan, NP   ATTENDING: Abel Presto, MD   REASON FOR CONSULTATION: Acute pancreatitis versus alcohol abuse.   HISTORY OF PRESENT ILLNESS: [name] is a [name] with significant past medical history of brain tumor, astrocytoma, seizure disorder, Hepatitis C, alcoholic cirrhosis, anxiety, depression, portal hypertensive, gastropathy, hypertension, chronic obstructive pulmonary disease, coronary artery disease, CVA, and medication nonadherence.   [name] presented to [facility] Emergency Room after an acute onset of nausea which occurred from arising out of the bed that morning, yesterday, and then vomiting up brown-like emesis, cramping to abdomen specifically to upper abdomen radiating through to her back, very classic presentation for pancreatitis in the setting that she has a known history of chronic pancreatitis. She has continued up until seven days ago to drink 1 to 2 cases of beer on the weekends. She was very diaphoretic. She had checked her fever which was also an acute onset that morning of 103. She had taken two Advil prior to presenting to the hospital and was found to have a temperature of 102. She has had a cough coughing up light brown colored sputum. Last bowel movement was two days ago. Up until then she had had a four day history of diarrhea, described it as brown watery feces. Denies being around any ill contacts. No recent antibiotic use or travel. No rectal bleeding. No melena.   She underwent an upper endoscopy by Dr. Jill Side on 12/22/2009 which revealed evidence of esophagus being normal except for a subtle nonobstructing Schatzki's ring, gastritis, and portal gastropathy. Duodenal mass revealed evidence of inflammatory changes.   PAST  MEDICAL HISTORY:  1. History of a brain tumor, astrocytoma.  2. History of seizure disorder. 3. Hepatitis C diagnosed 10 years ago.  4. Alcoholic cirrhosis. 5. Anxiety. 6. Depression. 7. Portal hypertensive gastropathy.  8. Hypertension.  9. Hiatal hernia.   10. Chronic obstructive pulmonary disease. 11. Osteoporosis. 12. Coronary artery disease, status post two myocardial infarctions in 2009 and 2010. 13. History of CVA at the time of her brain surgery 01/07/2006. 14. History of medication nonadherence. 15. History of epistaxis. 16. Schizophrenia. 17. Chronic pancreatitis diagnosed two months ago.   PAST SURGICAL HISTORY:  1. Appendectomy.  2. Cholecystectomy. 3. Craniotomy. 4. 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  5. Patella surgery.   ALLERGIES: Penicillin, chocolate, ibuprofen, Tylenol in tolerance due to hepatic function, sulfa, Lasix.   MEDICATIONS:  1. Advair 250/50 one inhalation twice a day. 2. Albuterol 4 times daily by nebulizer. 3. Amitriptyline 50 mg at bedtime. 4. Famotidine 20 mg twice a day. 5. Keppra 500 mg twice a day. 6. Lexapro 20 mg a day.  7. Omeprazole 40 mg twice a day. 8. Seroquel 350 mg extended-release nightly. 9. Spironolactone 50 mg t.i.d.  10. Phenytoin liquid 125/5 mL 9 mL b.i.d.  11. Pancreatic enzyme therapy 12,000/38,000 units/600,000 units 2 tablets t.i.d.  12. Amlodipine 10 mg a day. 13. Metoprolol XL 25 mg a day. 14. Spiriva 18 mcg inhalation daily.  15. Combivent as directed as needed.  16. Lorazepam 1 mg t.i.d. as needed.   Of note, the patient states that she has run out of much of her medications over the past 1 to 2 weeks and transitioned for  a new primary care doctor.   FAMILY HISTORY: Mother with history of brain tumor and hypertension. Father myocardial infarction. Mom diverticulosis. Paternal side of the family coronary artery disease, diabetes. Mother's side of the  family breast cancer involving aunt.   SOCIAL HISTORY: Resides with her husband. Smokes a pack of cigarettes a day. States that she was drinking a case of beer, 1 to 2 on the weekends up until seven days ago. Noted is a discrepancy in social history as according to hospitalist she states that she was drinking on a daily basis.   REVIEW OF SYSTEMS: CONSTITUTIONAL: Significant for fever of 102, chills. No sweats or dizziness. ENT: No blurred vision, double vision. ENT: No tinnitus. No hearing loss. RESPIRATORY: Significant for cough, productive. No painful respiration. Cough with occasional blood-tinged sputum. GI: See history of present illness. GU: Some dysuria, increased frequency, urination darker in color, brown-orange. MUSCULOSKELETAL: Chronic arthritis. SKIN: Significant for generalized itching. NEUROLOGICAL: Significant for seizures. Last seizure was greater than a week ago. PSYCH: Significant for depression and anxiety as well as schizophrenia.   PHYSICAL EXAMINATION:   VITAL SIGNS: Temperature 98.1, pulse 89, respirations 18, blood pressure 105/68, pulse oximetry 99%.   GENERAL: Well developed, morbidly obese 59 year old [place] American female, no acute distress noted.   HEENT: Normocephalic, atraumatic. Pupils reactive to light. Conjunctivae clear. Sclerae anicteric.   NECK: Supple. Trachea midline. No lymphadenopathy or thyromegaly.   PULMONARY: Symmetric rise and fall of chest with a deep breath though induces a cough, nonproductive.   CARDIOVASCULAR: Regular rhythm, S1, S2.   ABDOMEN: Large, distended, large abdominal girth size. Multiple incisional surgical scars  noted. Marked discomfort throughout entire abdomen, more localized epigastric, right upper quadrant. No masses. No bruits.   RECTAL: Deferred.   MUSCULOSKELETAL: Moving all four extremities. No contractures. No clubbing.   EXTREMITIES: No edema.   PSYCH: Flat affect. Depressive mood.   SKIN: Warm, dry. No  lesions. No rashes. No evidence of cyanosis or jaundice.   NEUROLOGICAL: No gross neurological deficits.   LABORATORY, DIAGNOSTIC, AND RADIOLOGICAL DATA: Chemistry panel on admission on the 28th revealed glucose to be elevated at 114, otherwise within normal limits. Lipase was 1330 which is increased today to 2271. HDL 22. Urine drug screen was positive for opiates, positive for benzodiazepine. Alcohol is less than 0.003. Hepatic panel total protein 8.7 with a total bilirubin 1.8, alkaline phosphatase 159, AST 189, ALT 133, albumin 3.5. Dilantin level is low at 1.3. CBC today hemoglobin 12.3 with hematocrit 37.1, platelet count low at 140,000, MCV 102. PT 14.9 with an INR of 1.1. Urine culture clean catch revealed greater than 100,000 CFU/mL gram-negative rods. Urinalysis revealed +2 bilirubin, protein 100 mg/dL, bacteria +3.  Three-way of abdomen inclusive of PA of chest was unremarkable abdominal series.   Abdominal ultrasound no acute changes identified, status post cholecystectomy. Pancreas is not visualized adequately for evaluation on this exam.   IMPRESSION:  1. Acute pancreatitis on top of chronic pancreatitis.  2. Known history of Hepatitis C. 3. Alcoholic cirrhosis.  4. Depression. 5. Anxiety. 6. Schizophrenia.  7. Coronary artery disease. 8. Cerebrovascular accident.   PLAN: The patient's presentation was discussed with Dr. Gaylyn Cheers. Will continue to monitor the patient's hemodynamic status, laboratory results, as well as hydration status. Continue IV hydration normal saline at 150 mL an hour. Order placed for multivitamin one amp daily. If the patient's fever should recur, do recommend blood cultures as well as sputum culture. Initiate antibiotic therapy empirically.  These services provided by Payton Emerald, NP under collaborative agreement with Gaylyn Cheers, MD.  ____________________________ Payton Emerald, NP dsh:drc D: 11/04/2011 16:08:17 ET T: 11/05/2011  07:16:09 ET JOB#: [id number]  cc: Payton Emerald, NP, <Dictator> Payton Emerald MD ELECTRONICALLY SIGNED 11/06/2011 13:22

## 2014-10-30 NOTE — Consult Note (Signed)
VSS afebrile, she complains less pain today.  No vomiting.  Lipase down to 1000 from 2000 yesterday.  Chest clear but a lot of coughing with deep breath.  If lipase down a lot tomorrow could start low fat diet.  Will give incentive spirometer.  Electronic Signatures: Manya Silvas (MD)  (Signed on 01-May-13 13:13)  Authored  Last Updated: 01-May-13 13:13 by Manya Silvas (MD)

## 2014-10-30 NOTE — Consult Note (Signed)
Pt with sore forming on lower lip on right side, is painful.  Pt trying clear liquids, still with abd tenderness, bowel sounds present, chest with good breath sounds.  Pt spitting up some blood but not saved for me to see.  Lipase 2033, WBC 4.4, hgb 11l4, plt 113   TB 1.3, SGOT 100, SGPT 161.  E. coli in urine and antibiotic changed to Rocephin.  No new suggestions, keep at no more than clear liquids for now.    Electronic Signatures: Manya Silvas (MD)  (Signed on 30-Apr-13 18:06)  Authored  Last Updated: 30-Apr-13 18:06 by Manya Silvas (MD)

## 2014-10-30 NOTE — Discharge Summary (Signed)
PATIENT NAME:  [name], [name] MR#:  [id number] DATE OF BIRTH:  [birth year]  DATE OF ADMISSION:  07/28/2011 DATE OF DISCHARGE:  08/01/2011  DISCHARGE DIAGNOSES:  1. Acute on chronic pancreatitis.  2. Alcohol abuse.  3.   Seizure disorder. 4. Chronic obstructive pulmonary disease. 5.   Epistaxis. 6.   Noncompliance with medical regimen.  SECONDARY DIAGNOSES: 1. Chronic hepatitis C. 2. Hypertension.  3. Gastroesophageal reflux disease.  4. Schizophrenia.  5. Depression.  6. Morbid obesity  DISCHARGE MEDICATIONS: The patient is to resume all home medications. Please see discharge instructions for full details in this regard. Additionally, the patient is to begin taking the following medications:  1. Spiriva, inhale the contents of 1 capsule once a day. (For full effect, do 2 inhalations for every 1 capsule.)  2. Combivent 2 puffs up to q.i.d. as needed.  3. melatonin 3 mg, 1 tab/capsule by mouth at bedtime to help with sleep.  4. levetiracetam (Keppra) 500 mg, 1 tab by mouth b.i.d.   5. Ativan 1 mg, 1 by mouth up to t.i.d. as needed (per Dr. Elijio Miles). 6. oxycodone 5 mg, 1 by mouth up to t.i.d. as needed (per Dr. Elijio Miles).  7.  azithromycin, 250 mg, 1 tab by mouth once a day for 2 more days (to complete a 5-day course). 8.   prednisone, 20 mg, 2 tabs by mouth once a day for 4 more days (to complete a 7-day course).  HOSPITAL COURSE: The patient is a [name] with multiple medical issues who presented to the Emergency Room on the date of admission with a chief complaint of, "I have pancreatitis again."  The patient did also say she was having seizures, reporting three seizures on the date of admission, and the patient also reportedly had three seizures two days prior. The patient admitted to relapsing on alcohol again, this time saying she drank 12 beers in under one hour. She has a well-known history of alcohol abuse and also a remote history of substance abuse.  She also has a well-known history of markedly poor compliance both with her medical treatment and with maintaining regular follow-up visits as an outpatient. She gave various reasons as to why she was not taking her seizure medications, citing her nausea from her acute pancreatitis as precluding her taking her medications as being the most prominent reason. However, a review of her pharmacy records revealed that she had not been taking her seizure medicines at all for quite some time, again reflecting her overall markedly poor compliance both with her medical treatment and with her follow-up visits, as well as her tendency to be less than forthcoming at times, including with respect to important issues surrounding her health care. It is also worth noting the potential role her relapse on alcohol played with respect to her seizures.  Please see History and Physical examination from the date of admission for full details regarding her initial triage, evaluation, and treatment. The patient was admitted to a standard medical bed. She was put on CIWA protocol, IV fluids, and kept N.P.O. due to her acute pancreatitis. The patient was also started back on her antiepileptic medications, and she remained seizure-free throughout her entire hospital stay. The patient also received her standard home medications while in the hospital, but it should be noted the patient told the admitting hospitalist she was taking several medications that she actually was not taking, including oxycodone and Ambien.  [facility] assumed care  of the patient on the day following admission. The patient was talked to about her markedly poor compliance both with her medications, conditions, and with maintaining regular follow-up visits. The patient again gave various unfounded reasons for her noncompliance. Her medications and treatments were adjusted appropriately based on her clinical progress. The  patient exhibited extreme noncompliance with her dietary restrictions due to her acute pancreatitis and was also a difficult patient for Nursing, Care Management, and all of those involved in her care. Her acute pancreatitis was managed in the standard fashion (e.g. IV fluids, pain management, keeping patient initially N.P.O. with subsequent prudent advancement of diet, etc.) and her acute pancreatitis was overall uncomplicated and clinically resolved by the day of discharge. Although patient was put on CIWA protocol given her alcohol consupmtion, she rarely required any CIWA coverage during her hospital stay. Also, as aforementioned, she remained seizure-free during her hospital stay.  While the patient was in the hospital, an evaluation for her reported recurrent brain tumor was undertaken. Of note, the patient gave discrepant information regarding the characteristics of her brain tumor. Previously, she reported that her brain tumor was benign; however, she told the admitting hospitalist that her brain tumor was in fact an astrocytoma, which is in no way benign. Thus, an evaluation to further characterize her reported brain tumor was undertaken. When the patient was told that she was going to have magnetic resonance imaging of her brain to further characterize her reported brain tumor, the patient said that she had a metallic plate in her head from a previous surgery related to her brain tumor. Thus, a CT of the head with and without contrast was obtained. The CT of the head with and without contrast showed no evidence of recurrent neoplasm or malignancy, and furthermore showed no metallic object in the patient's head. On further review of the patient's chart, it was also found that the patient had undergone magnetic resonance cholangiopancreatography and magnetic resonance cholangiography during a previous hospitalization in late 2012.  Thus, the patient was scheduled for MRI of the brain with and without  contrast to include her orbits. However, the patient did not cooperate with the exam. Dr. Elijio Miles was contacted by the Radiology Department, and the Radiology Department said that the patient refused to go through with the contrast portion of the exam, with the patient stating she had "had enough." Overall, there was  growing question surrounding the patient's reliability in her history regarding her medical conditions, including the consideration of the possibility that the patient is prevaricating for secondary gain.   The patient did exhibit what could be characterized as drug-seeking behavior throughout the majority of her stay at the hospital. She also continued to be noncompliant with her diet with respect to her pancreatitis. Nevertheless, her hospital-administered diet was advanced in the standard fashion for pancreatitis, and the patient did tolerate this without any issues.   The patient did also have a mild chronic obstructive pulmonary disease exacerbation when she presented on day of admission, and this was also managed in the usual fashion with antibiotics, steroids, inhaled nebulizer treatments, and the like. She continues to smoke, which was discussed at length. The exacerbation was overall uncomplicated and had resolved by the day of discharge. She was given prescriptions to finish a 5-day course of azithromycin and a 7-day course of prednisone.  The patient also complained of mild epistaxis, which remained uncomplicated during her hospital stay. She did have a mild fluctuating thrombocytopenia which is likely secondary to  her medical comorbidities. However, her platelets did reach normal levels and remained there and were normal on her day of discharge. Her INR and activated PTT were also normal. The patient will be referred to ENT for further evaluation as an outpatient for her epistaxis.   The patient's other medical comorbidities remained otherwise stable during her hospitalization and  were managed without issue.   On day of discharge, the patient was deemed stable for discharge by Internal Medicine, and the patient was discharged from the hospital in satisfactory condition.   DIET: Low salt ADA, low cholesterol diet.   ACTIVITY: As tolerated.   FOLLOWUP:  1. The patient will have a follow-up visit within 7 days with Wilnette Kales, PA-C, MSPAS, of [place].   2.   The patient will be referred to ENT for further evaluation of her epistaxis. 3. The patient will follow up with [facility] within 7-10 days of discharge. 4.  The patient was referred to [place] during her stay, and will see them on 08/05/2011.  5. The patient will follow up with Gastroenterology as scheduled.  6. The patient also will be seeing Pain Management in early February.    TIME SPENT: Discharge time spent including counseling, coordination of patient care and the like was greater than 60 minutes.   ____________________________ Kizzie Furnish Audie Clear, MSPAS, dictating on behalf of Dr. Elijio Miles. mgm:cbb D: 08/01/2011 15:46:44 ET T: 08/02/2011 10:36:31 ET JOB#: [id number]  cc: Kizzie Furnish. Prudence Davidson, PA, <Dictator> Elwyn Reach PA ELECTRONICALLY SIGNED 08/05/2011 19:44 Veverly Fells MD ELECTRONICALLY SIGNED 08/09/2011 12:15

## 2014-10-30 NOTE — Consult Note (Signed)
Brief Consult Note: Diagnosis: Acute pancreatitis in the setting of known history of chronic pancreatitis.  History of alcoholic cirrhosis, hepatitis C and portal hypertensive gastropathy.   Consult note dictated.   Discussed with Attending MD.   Comments: Patient's presentation discussed with Dr. Gaylyn Cheers.  One MV IVPB ordered on a daily basis.  If fever occurs again recommend blood culture as well as sputum and start empirically antibiotic therapy. Will continue to monitor laboratory studies.  Continue IV hydration.  NPO except for ice chips.  Electronic Signatures: Payton Emerald (NP)  (Signed 29-Apr-13 16:07)  Authored: Brief Consult Note   Last Updated: 29-Apr-13 16:07 by Payton Emerald (NP)

## 2014-10-30 NOTE — Consult Note (Signed)
Chart reviewed, patient examined, case discussed with NP.  Alcohol pancreatitis, with rising lipase.  Hep C, alcoholism, Seizures, schiizophrenia, depression.anxiety.  Pt discharged from [place] and currently without a primary doctor.  Pt known to me from previous hospital consults.  No new suggestions.  Keep on ice chips and sips only til pancreas cools down.  Consider CT if no improvement in a few days.  Will follow with you.  Electronic Signatures: Manya Silvas (MD)  (Signed on 29-Apr-13 19:02)  Authored  Last Updated: 29-Apr-13 19:02 by Manya Silvas (MD)

## 2014-10-30 NOTE — H&P (Signed)
PATIENT NAME:  [name], [name] MR#:  [id number] DATE OF BIRTH:  1955/11/13  DATE OF ADMISSION:  07/28/2011  PRIMARY CARE PHYSICIAN: [facility]    CHIEF COMPLAINT: Abdominal pain.   HISTORY OF PRESENT ILLNESS: This is a 59 year old female with multiple medical issues comes in with abdominal pain. She states "I have pancreatitis again". Pain described as 11/10 in intensity going on for the past two days. It moves around inside her, a jerking-type pain, pulling-type pain, worse on the left side. She also developed nausea and vomiting three times this morning and had three seizures back to back to back this a.m. She is coughing up yellow-green phlegm. She feels short of breath. She does have a history of chronic obstructive pulmonary disease and she also hurting under her left breast. In the ER, she was found to have an elevated lipase of 1083 and hospitalist services were contacted for further evaluation.   PAST MEDICAL HISTORY:  1. 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  2. Seizure disorder.  3. Hepatitis C. 4. Alcoholic cirrhosis. 5. Anxiety, depression. 6. Portal hypertensive gastropathy.  7. Hypertension. 8. Hiatal hernia.  9. Chronic obstructive pulmonary disease.  10. Osteoporosis.  11. Coronary artery disease. 12. Incontinence.  13. Cerebrovascular accident.   PAST SURGICAL HISTORY:  1. Appendectomy.  2. Cholecystectomy. 3. Craniotomy.  4. Rectovaginal fistula repair. 5. History of colostomy. 6. Kneecap surgery.   ALLERGIES: Penicillin, chocolate, ibuprofen, Tylenol, tomatoes, sulfa, and Lasix.   MEDICATIONS:  1. Adderall 10 mg daily.  2. Advair Diskus 250/50, 1 inhalation twice a day. 3. Albuterol nebulizer 4 times a day.  4. Amitriptyline 50 mg p.o. nightly.  5. Famotidine 20 mg twice a day.  6. Keppra 500 mg twice a day. 7. Lexapro 20 mg daily.  8. Omeprazole 40 mg twice a day. 9. Oxycodone 10 mg t.i.d.   10. Seroquel 300 mg at night, 50 in the morning; the XR is 50 in the a.m.  11. Spironolactone 50 mg t.i.d.  12. Ambien 10 mg at bedtime.  13. Phenytoin liquid 125/5 mL, 9 mL b.i.d.  14. Pancrelipase 1200/38,000/600,000, 2 tablets t.i.d.   FAMILY HISTORY: Father died of a myocardial infarction at age 52. Mother had brain tumor resected twice and has hypertension.   SOCIAL HISTORY: Lives with husband. Smokes 1 pack of cigarettes per day. Drinks a case of beer per day.   REVIEW OF SYSTEMS: CONSTITUTIONAL: Positive for fever of 102, chills, and sweats. Positive for fatigue. Positive for dizziness and headache. EYES: Decreased vision in left eye secondary to tumor. EARS, NOSE, MOUTH, AND THROAT: Decreased hearing in the left ear with some pain. Nosebleed on the left side. Sore throat. Dysphagia to both solids and liquids. CARDIOVASCULAR: Positive for chest pain under the left breast. RESPIRATORY: Positive for cough, yellow phlegm. GASTROINTESTINAL: Positive for nausea, vomiting, and abdominal pain. No diarrhea. No constipation. No bright red blood per rectum. No melena. GENITOURINARY: No burning on urination. No hematuria. MUSCULOSKELETAL: Positive for right shoulder pain. INTEGUMENT: Positive for itching all over. NEUROLOGICAL: Positive for seizures this morning. PSYCHIATRIC: Positive for anxiety, depression. ENDOCRINE: No thyroid problems. HEMATOLOGIC/LYMPHATIC: No anemia. No easy bruising or bleeding.   PHYSICAL EXAMINATION:  VITAL SIGNS: Temperature 98.8, pulse 100, respirations 18, blood pressure 138/92, pulse oximetry 97% on oxygen.   GENERAL: No respiratory distress.   EYES: Conjunctivae and lids normal. Pupils equal, round, and reactive to light. Extraocular muscles intact. No nystagmus.  EARS, NOSE, MOUTH, AND THROAT: Nasal mucosa no lesions seen. Throat no lesions seen. No erythema.   NECK: No JVD. No bruits. No lymphadenopathy. No thyromegaly. No thyroid nodules palpated.    RESPIRATORY: Decreased breath sounds bilaterally. Positive coughing with deep breath. Rhonchi bilaterally. No use of accessory muscles to breathe. Coughing with deep breath.   CARDIOVASCULAR: S1, S2 normal. No gallops, rubs, or murmurs heard. Carotid upstroke 2+ bilaterally. No bruits.   EXTREMITIES: Dorsalis pedis pulses 2+ bilaterally. No edema of the lower extremity.   ABDOMEN: Soft, positive tenderness in the epigastric area center of the abdomen and worse on the left upper quadrant.   LYMPHATIC: No lymph nodes in the neck.   MUSCULOSKELETAL: No clubbing, edema, or cyanosis.   SKIN: No ulcers or lesions.   NEUROLOGIC: Cranial nerves II through XII grossly intact. Deep tendon reflexes 1+ bilateral lower extremity. No tremor seen.   PSYCHIATRIC: Patient is oriented to person, place, and time.   LABORATORY, DIAGNOSTIC, AND RADIOLOGICAL DATA: White blood cell count 4.9, hemoglobin and hematocrit 13.1 and 38.9, platelet count 163, lipase 1083, glucose 99, BUN 7, creatinine 0.85, sodium 140, potassium 4.0, chloride 107, CO2 23, calcium 9.5, ALT 134, AST 211, total protein 8.9, albumin 3.5. EKG shows sinus tachycardia, 104 beats per minute, no acute ST-T wave changes.   ASSESSMENT AND PLAN:  1. Acute pancreatitis with severe abdominal pain. Will admit to the medical floor. This is secondary to alcohol abuse. Will put on CIWA protocol. Will give vigorous IV fluid hydration. N.P.O., p.r.n. IV morphine and p.r.n. IV Zofran. Check lipase in the a.m., BMP and CBC in a.m.  2. Seizure disorder. Unable to take medications secondary to nausea, vomiting. Will switch Keppra and Dilantin over to IV, first dose STAT on the Keppra. Will also check a Dilantin level.  3. Chronic obstructive pulmonary disease exacerbation. Will give IV Solu-Medrol, IV Levaquin. Will obtain a chest x-ray and continue nebulizers.  4. History of cirrhosis with hepatitis C and increased liver function tests most likely secondary  to alcohol abuse. I advised that alcohol and cirrhosis and hepatitis C are not a good combination.  5. History of astrocytoma with reoccurrence. If this is true the overall prognosis is poor. Patient made a DO NOT RESUSCITATE.  6. Hypertension. Blood pressure currently stable on current medications.  7. History of cerebrovascular accident.  8. History of coronary artery disease.  9. Anxiety, depression. Continue psychiatric medications.  10. Tobacco abuse. Smoking cessation counseling done, three minutes by me. Nicotrol inhaler prescribed.   TIME SPENT ON ADMISSION: 60 minutes.   ____________________________ Tana Conch. Leslye Peer, MD rjw:cms D: 07/28/2011 14:49:47 ET T: 07/28/2011 15:16:22 ET JOB#: [id number]  cc: Tana Conch. Leslye Peer, MD, <Dictator> Sheikh A. Elijio Miles, MD Marisue Brooklyn MD ELECTRONICALLY SIGNED 07/29/2011 10:49

## 2014-10-31 DIAGNOSIS — I10 Essential (primary) hypertension: Secondary | ICD-10-CM | POA: Diagnosis not present

## 2014-11-01 DIAGNOSIS — K859 Acute pancreatitis, unspecified: Secondary | ICD-10-CM | POA: Diagnosis not present

## 2014-11-01 DIAGNOSIS — N823 Fistula of vagina to large intestine: Secondary | ICD-10-CM | POA: Diagnosis not present

## 2014-11-01 DIAGNOSIS — E669 Obesity, unspecified: Secondary | ICD-10-CM | POA: Diagnosis not present

## 2014-11-01 DIAGNOSIS — J449 Chronic obstructive pulmonary disease, unspecified: Secondary | ICD-10-CM | POA: Diagnosis not present

## 2014-11-01 DIAGNOSIS — L659 Nonscarring hair loss, unspecified: Secondary | ICD-10-CM | POA: Diagnosis not present

## 2014-11-01 DIAGNOSIS — R6882 Decreased libido: Secondary | ICD-10-CM | POA: Diagnosis not present

## 2014-11-01 DIAGNOSIS — L0292 Furuncle, unspecified: Secondary | ICD-10-CM | POA: Diagnosis not present

## 2014-11-01 DIAGNOSIS — G8929 Other chronic pain: Secondary | ICD-10-CM | POA: Diagnosis not present

## 2014-11-01 DIAGNOSIS — K219 Gastro-esophageal reflux disease without esophagitis: Secondary | ICD-10-CM | POA: Diagnosis not present

## 2014-11-01 DIAGNOSIS — Z716 Tobacco abuse counseling: Secondary | ICD-10-CM | POA: Diagnosis not present

## 2014-11-01 DIAGNOSIS — I10 Essential (primary) hypertension: Secondary | ICD-10-CM | POA: Diagnosis not present

## 2014-11-02 ENCOUNTER — Inpatient Hospital Stay
Admit: 2014-11-02 | Discharge: 2014-11-05 | Disposition: A | Discharge status: Home / Self Care | Payer: Medicare Other | Attending: Internal Medicine | Admitting: Internal Medicine

## 2014-11-02 DIAGNOSIS — Z933 Colostomy status: Secondary | ICD-10-CM | POA: Diagnosis not present

## 2014-11-02 DIAGNOSIS — K625 Hemorrhage of anus and rectum: Secondary | ICD-10-CM | POA: Diagnosis not present

## 2014-11-02 DIAGNOSIS — Z882 Allergy status to sulfonamides status: Secondary | ICD-10-CM | POA: Diagnosis not present

## 2014-11-02 DIAGNOSIS — N823 Fistula of vagina to large intestine: Secondary | ICD-10-CM | POA: Diagnosis not present

## 2014-11-02 DIAGNOSIS — J441 Chronic obstructive pulmonary disease with (acute) exacerbation: Secondary | ICD-10-CM | POA: Diagnosis not present

## 2014-11-02 DIAGNOSIS — Z880 Allergy status to penicillin: Secondary | ICD-10-CM | POA: Diagnosis not present

## 2014-11-02 DIAGNOSIS — R0602 Shortness of breath: Secondary | ICD-10-CM | POA: Diagnosis not present

## 2014-11-02 DIAGNOSIS — R1030 Lower abdominal pain, unspecified: Secondary | ICD-10-CM | POA: Diagnosis not present

## 2014-11-02 DIAGNOSIS — K861 Other chronic pancreatitis: Secondary | ICD-10-CM | POA: Diagnosis not present

## 2014-11-02 DIAGNOSIS — J209 Acute bronchitis, unspecified: Secondary | ICD-10-CM | POA: Diagnosis not present

## 2014-11-02 DIAGNOSIS — B1920 Unspecified viral hepatitis C without hepatic coma: Secondary | ICD-10-CM | POA: Diagnosis not present

## 2014-11-02 DIAGNOSIS — R1012 Left upper quadrant pain: Secondary | ICD-10-CM | POA: Diagnosis not present

## 2014-11-02 DIAGNOSIS — K529 Noninfective gastroenteritis and colitis, unspecified: Secondary | ICD-10-CM | POA: Diagnosis not present

## 2014-11-02 DIAGNOSIS — J440 Chronic obstructive pulmonary disease with acute lower respiratory infection: Secondary | ICD-10-CM | POA: Diagnosis not present

## 2014-11-02 DIAGNOSIS — R110 Nausea: Secondary | ICD-10-CM | POA: Diagnosis not present

## 2014-11-02 DIAGNOSIS — F329 Major depressive disorder, single episode, unspecified: Secondary | ICD-10-CM | POA: Diagnosis not present

## 2014-11-02 DIAGNOSIS — R10817 Generalized abdominal tenderness: Secondary | ICD-10-CM | POA: Diagnosis not present

## 2014-11-02 DIAGNOSIS — Z96659 Presence of unspecified artificial knee joint: Secondary | ICD-10-CM | POA: Diagnosis not present

## 2014-11-02 DIAGNOSIS — F17210 Nicotine dependence, cigarettes, uncomplicated: Secondary | ICD-10-CM | POA: Diagnosis not present

## 2014-11-02 DIAGNOSIS — Z888 Allergy status to other drugs, medicaments and biological substances status: Secondary | ICD-10-CM | POA: Diagnosis not present

## 2014-11-02 DIAGNOSIS — I10 Essential (primary) hypertension: Secondary | ICD-10-CM | POA: Diagnosis present

## 2014-11-02 DIAGNOSIS — N289 Disorder of kidney and ureter, unspecified: Secondary | ICD-10-CM | POA: Diagnosis not present

## 2014-11-02 DIAGNOSIS — K7030 Alcoholic cirrhosis of liver without ascites: Secondary | ICD-10-CM | POA: Diagnosis not present

## 2014-11-02 DIAGNOSIS — E119 Type 2 diabetes mellitus without complications: Secondary | ICD-10-CM | POA: Diagnosis not present

## 2014-11-02 DIAGNOSIS — I2510 Atherosclerotic heart disease of native coronary artery without angina pectoris: Secondary | ICD-10-CM | POA: Diagnosis not present

## 2014-11-02 DIAGNOSIS — Z883 Allergy status to other anti-infective agents status: Secondary | ICD-10-CM | POA: Diagnosis not present

## 2014-11-02 DIAGNOSIS — E876 Hypokalemia: Secondary | ICD-10-CM | POA: Diagnosis not present

## 2014-11-02 DIAGNOSIS — F17200 Nicotine dependence, unspecified, uncomplicated: Secondary | ICD-10-CM | POA: Diagnosis not present

## 2014-11-02 DIAGNOSIS — K30 Functional dyspepsia: Secondary | ICD-10-CM | POA: Diagnosis not present

## 2014-11-02 DIAGNOSIS — F200 Paranoid schizophrenia: Secondary | ICD-10-CM | POA: Diagnosis not present

## 2014-11-02 DIAGNOSIS — I252 Old myocardial infarction: Secondary | ICD-10-CM | POA: Diagnosis not present

## 2014-11-02 DIAGNOSIS — R197 Diarrhea, unspecified: Secondary | ICD-10-CM | POA: Diagnosis not present

## 2014-11-02 DIAGNOSIS — K922 Gastrointestinal hemorrhage, unspecified: Secondary | ICD-10-CM | POA: Diagnosis not present

## 2014-11-02 DIAGNOSIS — R112 Nausea with vomiting, unspecified: Secondary | ICD-10-CM | POA: Diagnosis not present

## 2014-11-02 DIAGNOSIS — Z885 Allergy status to narcotic agent status: Secondary | ICD-10-CM | POA: Diagnosis not present

## 2014-11-02 LAB — COMPREHENSIVE METABOLIC PANEL
ALT: 19 U/L
Albumin: 3.6 g/dL
Alkaline Phosphatase: 122 U/L
Anion Gap: 11 (ref 7–16)
BILIRUBIN TOTAL: 1.4 mg/dL — AB
BUN: 10 mg/dL
CHLORIDE: 106 mmol/L
Calcium, Total: 8.9 mg/dL
Co2: 24 mmol/L
Creatinine: 0.86 mg/dL
EGFR (African American): >60
EGFR (Non-African Amer.): >60
Glucose: 96 mg/dL
Potassium: 3.3 mmol/L — ABNORMAL LOW
SGOT(AST): 42 U/L — ABNORMAL HIGH
Sodium: 141 mmol/L
Total Protein: 8.1 g/dL

## 2014-11-02 LAB — CBC
HCT: 34.8 % — ABNORMAL LOW (ref 35.0–47.0)
HGB: 10.9 g/dL — ABNORMAL LOW (ref 12.0–16.0)
MCH: 25.2 pg — ABNORMAL LOW (ref 26.0–34.0)
MCHC: 31.5 g/dL — AB (ref 32.0–36.0)
MCV: 80 fL (ref 80–100)
Platelet: 128 10*3/uL — ABNORMAL LOW (ref 150–440)
RBC: 4.33 10*6/uL (ref 3.80–5.20)
RDW: 17 % — AB (ref 11.5–14.5)
WBC: 4.5 10*3/uL (ref 3.6–11.0)

## 2014-11-02 LAB — TROPONIN I: Troponin-I: <0.03 ng/mL

## 2014-11-02 LAB — RAPID INFLUENZA A&B ANTIGENS

## 2014-11-02 LAB — LIPASE, BLOOD: Lipase: 65 U/L — ABNORMAL HIGH

## 2014-11-03 DIAGNOSIS — I10 Essential (primary) hypertension: Secondary | ICD-10-CM | POA: Diagnosis not present

## 2014-11-03 LAB — CBC WITH DIFFERENTIAL/PLATELET
BASOS ABS: 0 10*3/uL (ref 0.0–0.1)
BASOS ABS: 0 10*3/uL (ref 0.0–0.1)
BASOS ABS: 0 10*3/uL (ref 0.0–0.1)
Basophil %: 0.3 %
Basophil %: 0.1 %
Basophil %: 0.1 %
EOS ABS: 0 10*3/uL (ref 0.0–0.7)
EOS PCT: 0 %
Eosinophil #: 0 10*3/uL (ref 0.0–0.7)
Eosinophil #: 0 10*3/uL (ref 0.0–0.7)
Eosinophil %: 0.2 %
Eosinophil %: 0 %
HCT: 33.1 % — ABNORMAL LOW (ref 35.0–47.0)
HCT: 33.5 % — ABNORMAL LOW (ref 35.0–47.0)
HCT: 32.3 % — AB (ref 35.0–47.0)
HGB: 10 g/dL — ABNORMAL LOW (ref 12.0–16.0)
HGB: 10.4 g/dL — ABNORMAL LOW (ref 12.0–16.0)
HGB: 10.1 g/dL — AB (ref 12.0–16.0)
LYMPHS ABS: 0.9 10*3/uL — AB (ref 1.0–3.6)
LYMPHS ABS: 1 10*3/uL (ref 1.0–3.6)
LYMPHS ABS: 0.9 10*3/uL — AB (ref 1.0–3.6)
LYMPHS PCT: 24.9 %
Lymphocyte %: 11.9 %
Lymphocyte %: 11.6 %
MCH: 25.4 pg — ABNORMAL LOW (ref 26.0–34.0)
MCH: 25.5 pg — AB (ref 26.0–34.0)
MCH: 25.5 pg — ABNORMAL LOW (ref 26.0–34.0)
MCHC: 30.2 g/dL — ABNORMAL LOW (ref 32.0–36.0)
MCHC: 31.2 g/dL — ABNORMAL LOW (ref 32.0–36.0)
MCHC: 31.2 g/dL — ABNORMAL LOW (ref 32.0–36.0)
MCV: 84 fL (ref 80–100)
MCV: 82 fL (ref 80–100)
MCV: 82 fL (ref 80–100)
MONO ABS: 0.1 x10 3/mm — AB (ref 0.2–0.9)
MONO ABS: 0.3 x10 3/mm (ref 0.2–0.9)
MONOS PCT: 3.8 %
MONOS PCT: 4.4 %
Monocyte #: 0.3 x10 3/mm (ref 0.2–0.9)
Monocyte %: 3.4 %
NEUTROS ABS: 6.5 10*3/uL (ref 1.4–6.5)
NEUTROS PCT: 71.2 %
Neutrophil #: 2.7 10*3/uL (ref 1.4–6.5)
Neutrophil #: 6.7 10*3/uL — ABNORMAL HIGH (ref 1.4–6.5)
Neutrophil %: 84.2 %
Neutrophil %: 83.9 %
PLATELETS: 100 10*3/uL — AB (ref 150–440)
Platelet: 123 10*3/uL — ABNORMAL LOW (ref 150–440)
Platelet: 110 10*3/uL — ABNORMAL LOW (ref 150–440)
RBC: 3.94 10*6/uL (ref 3.80–5.20)
RBC: 4.09 10*6/uL (ref 3.80–5.20)
RBC: 3.96 10*6/uL (ref 3.80–5.20)
RDW: 17.3 % — AB (ref 11.5–14.5)
RDW: 17 % — AB (ref 11.5–14.5)
RDW: 16.9 % — AB (ref 11.5–14.5)
WBC: 3.8 10*3/uL (ref 3.6–11.0)
WBC: 8 10*3/uL (ref 3.6–11.0)
WBC: 7.8 10*3/uL (ref 3.6–11.0)

## 2014-11-03 LAB — COMPREHENSIVE METABOLIC PANEL
ALK PHOS: 115 U/L
ANION GAP: 9 (ref 7–16)
Albumin: 3.1 g/dL — ABNORMAL LOW
BUN: 12 mg/dL
Bilirubin,Total: 0.8 mg/dL
Calcium, Total: 8.7 mg/dL — ABNORMAL LOW
Chloride: 108 mmol/L
Co2: 19 mmol/L — ABNORMAL LOW
Creatinine: 0.84 mg/dL
EGFR (African American): >60
EGFR (Non-African Amer.): >60
Glucose: 305 mg/dL — ABNORMAL HIGH
Potassium: 3.9 mmol/L
SGOT(AST): 34 U/L
SGPT (ALT): 15 U/L
Sodium: 136 mmol/L
TOTAL PROTEIN: 7.2 g/dL

## 2014-11-04 DIAGNOSIS — I10 Essential (primary) hypertension: Secondary | ICD-10-CM | POA: Diagnosis not present

## 2014-11-04 LAB — BASIC METABOLIC PANEL
Anion Gap: 8 (ref 7–16)
BUN: 19 mg/dL
CHLORIDE: 105 mmol/L
CO2: 25 mmol/L
Calcium, Total: 8.9 mg/dL
Creatinine: 0.75 mg/dL
EGFR (Non-African Amer.): >60
GLUCOSE: 195 mg/dL — AB
Potassium: 4.6 mmol/L
Sodium: 138 mmol/L

## 2014-11-04 LAB — CLOSTRIDIUM DIFFICILE(ARMC)

## 2014-11-04 LAB — PROTIME-INR
INR: 1.4
PROTHROMBIN TIME: 17.1 s — AB

## 2014-11-04 LAB — OCCULT BLOOD X 1 CARD TO LAB, STOOL: OCCULT BLOOD, FECES: POSITIVE

## 2014-11-04 LAB — WBCS, STOOL

## 2014-11-04 LAB — LIPASE, BLOOD: Lipase: 93 U/L — ABNORMAL HIGH

## 2014-11-04 LAB — HEMOGLOBIN: HGB: 9.4 g/dL — ABNORMAL LOW (ref 12.0–16.0)

## 2014-11-05 DIAGNOSIS — I10 Essential (primary) hypertension: Secondary | ICD-10-CM | POA: Diagnosis present

## 2014-11-05 IMAGING — CT CT HEAD WITHOUT CONTRAST
1 series · 16 of 30 positions shown, 20 images · non-contrast
Comparison: none

REASON FOR EXAM: syncope
COMMENTS:

PROCEDURE:     CT  - CT HEAD WITHOUT CONTRAST  - November 25, 2012  [DATE]
RESULT:     Comparison:  10/07/2012
TECHNIQUE: Multiple axial images from the foramen magnum to the vertex were
obtained without IV contrast.

[Series 4: soft tissue 2 · axial · 0.39mm/px · z∈[-116,+23]mm · 16 of 32 slices shown, 20 images]
[im 2/32  brain]
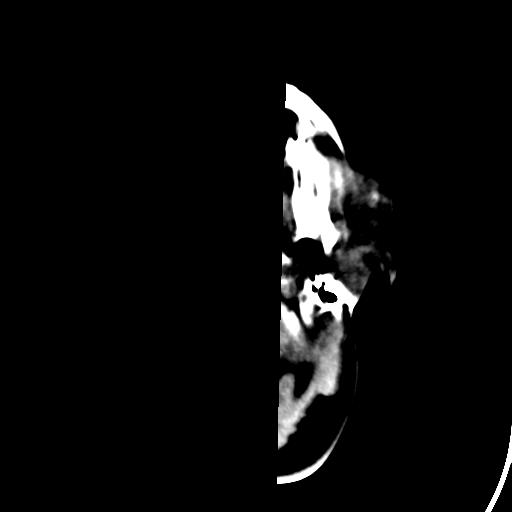
[im 2/32  bone]
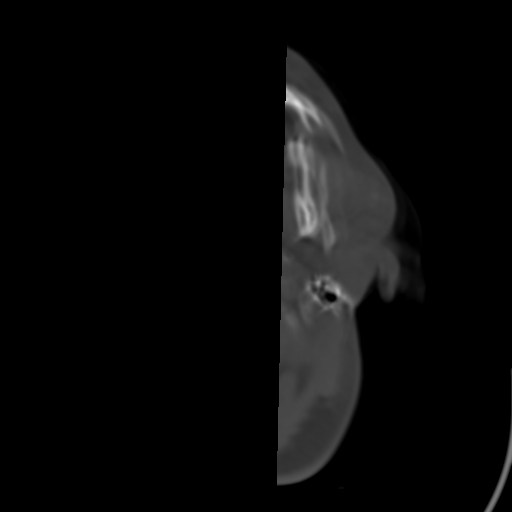
[im 4/32  brain]
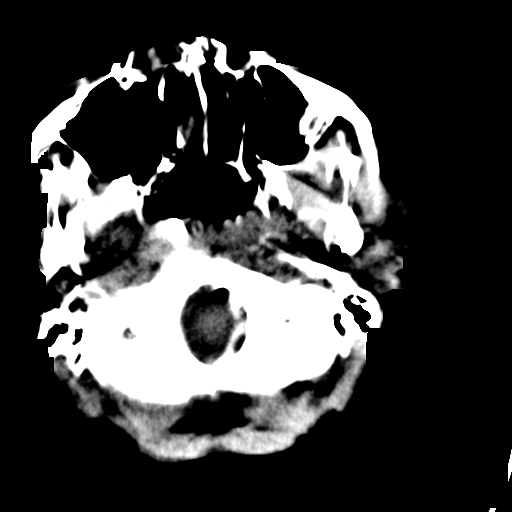
[im 6/32  brain]
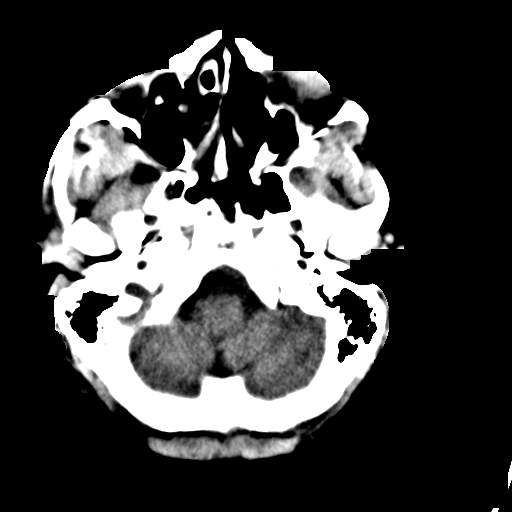
[im 8/32  brain]
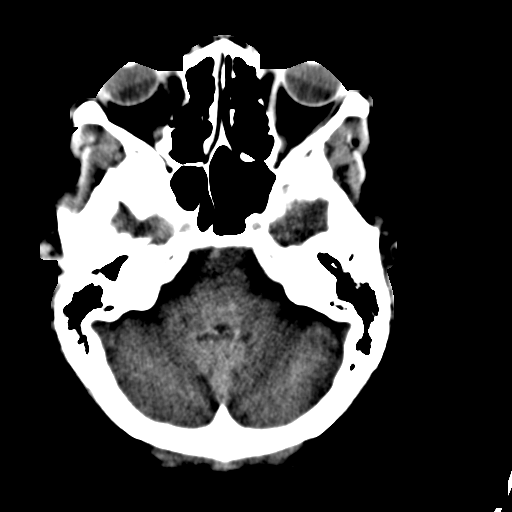
[im 9/32  brain]
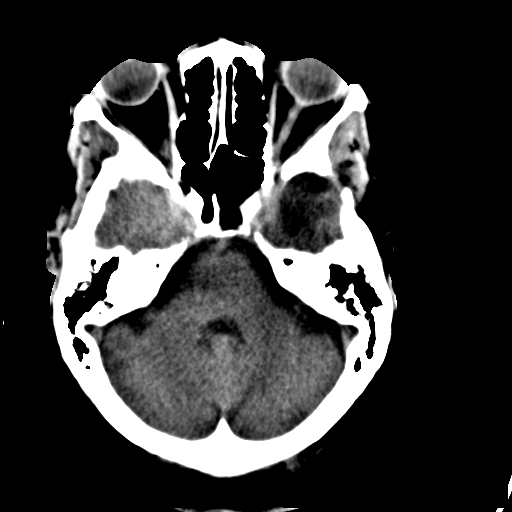
[im 9/32  bone]
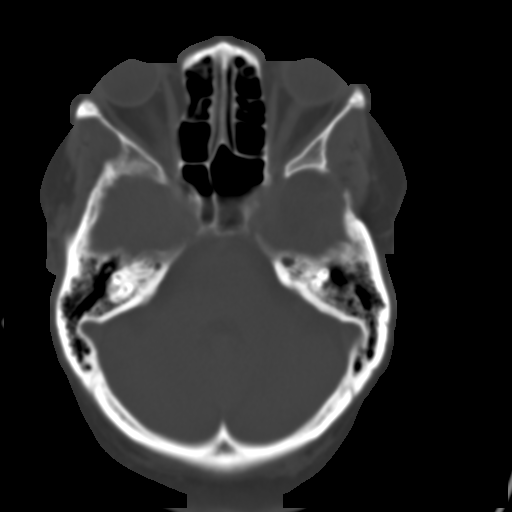
[im 11/32  brain]
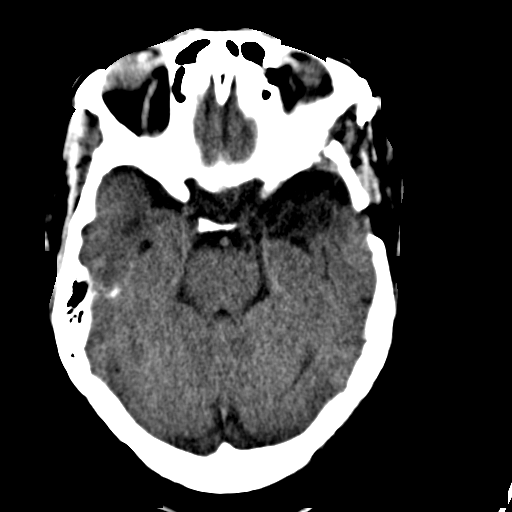
[im 13/32  brain]
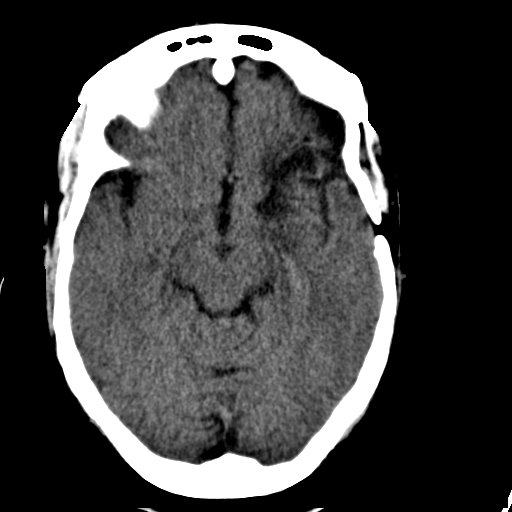
[im 15/32  brain]
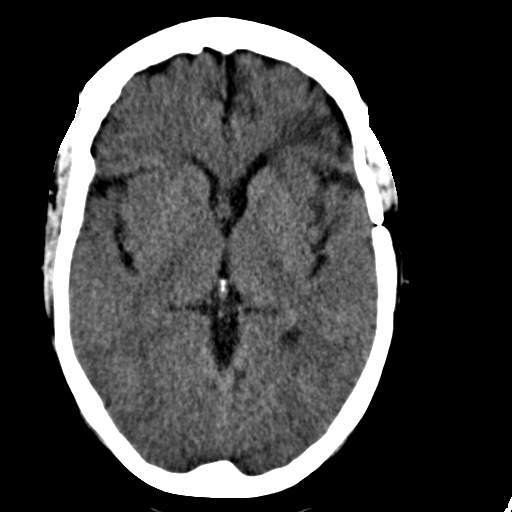
[im 17/32  brain]
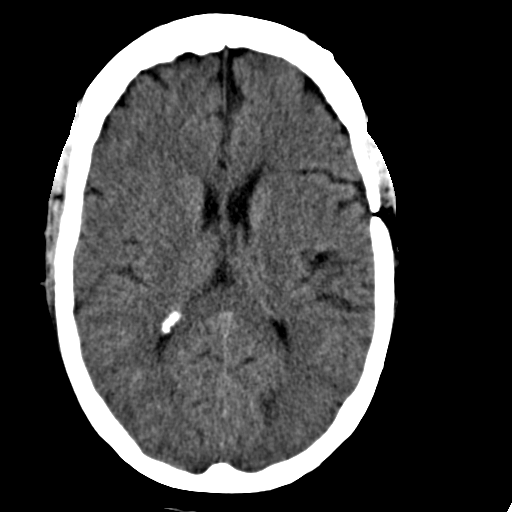
[im 17/32  bone]
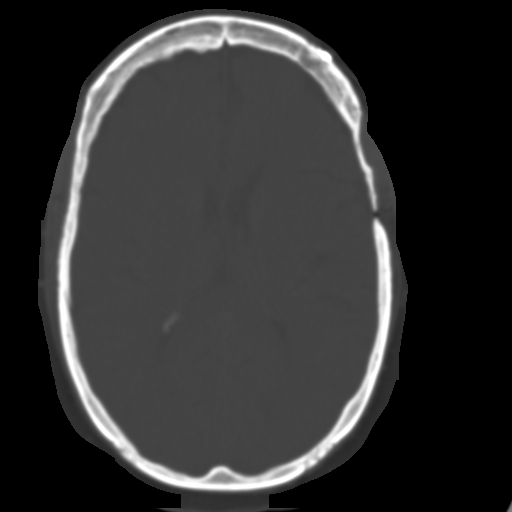
[im 19/32  brain]
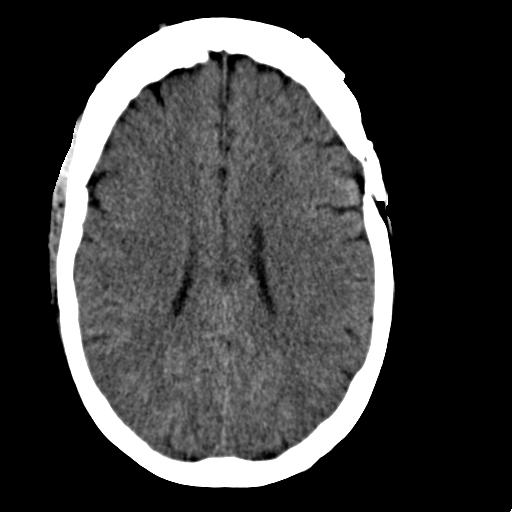
[im 21/32  brain]
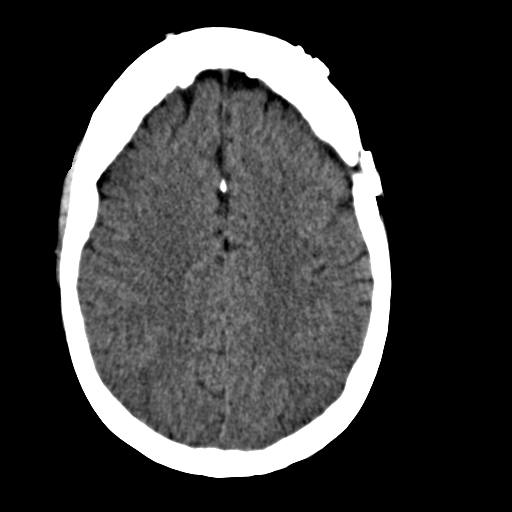
[im 23/32  brain]
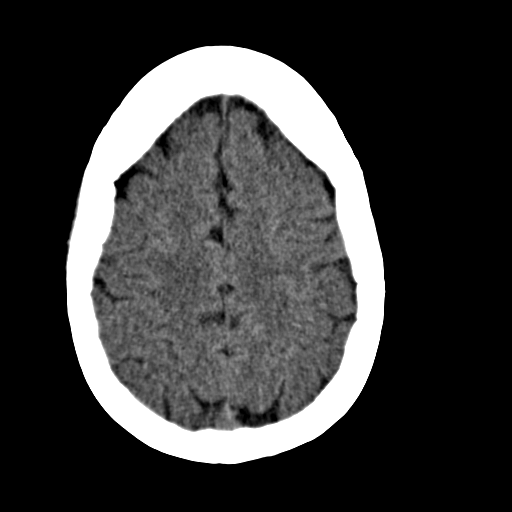
[im 24/32  brain]
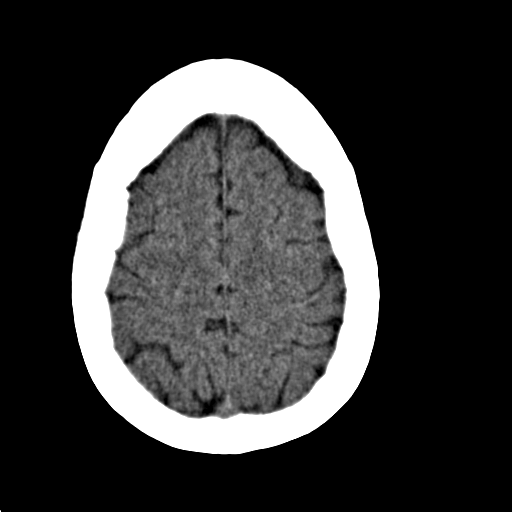
[im 24/32  bone]
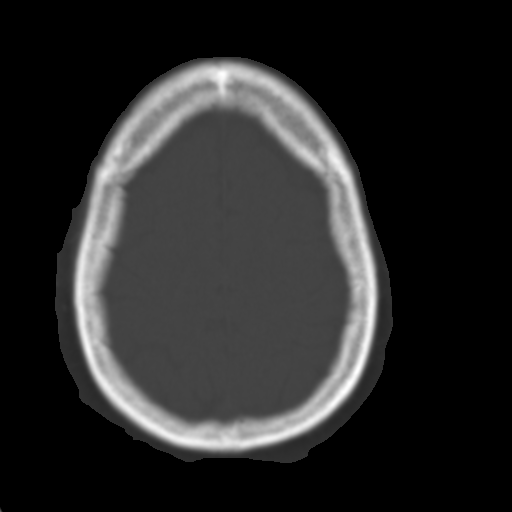
[im 26/32  brain]
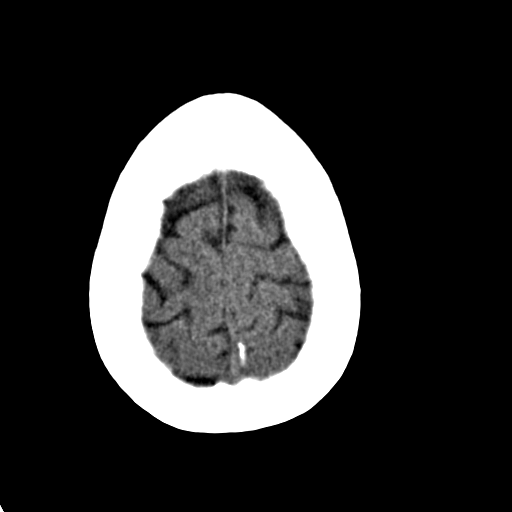
[im 28/32  brain]
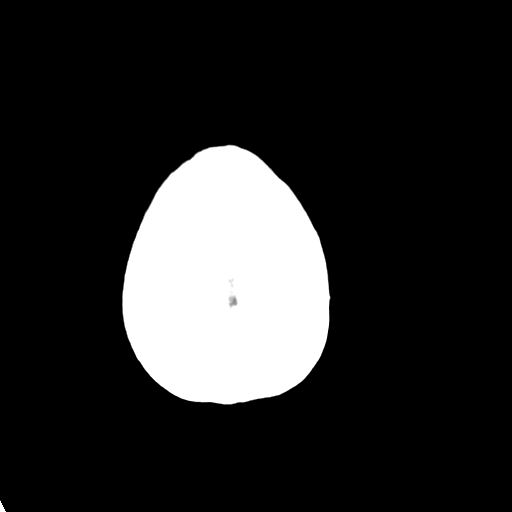
[im 30/32  brain]
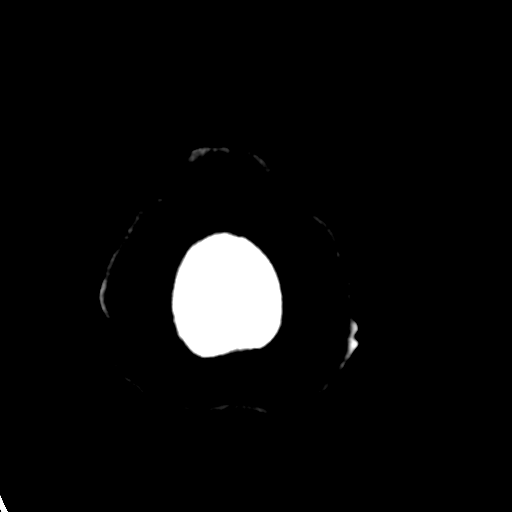

[16 of 30 positions shown; findings below may reference images not displayed]

FINDINGS: There is no evidence for mass effect, midline shift, or extra-axial fluid
collections. There is no evidence for space-occupying lesion, intracranial
hemorrhage, or acute cortical-based area of infarction. Encephalomalacia
seen in the left frontal lobe and anterior horn of the left temporal lobe.
This is similar to prior.

Postoperative changes seen from left frontal craniotomy.

There is motion artifact at the skull base.
IMPRESSION: No acute intracranial process.

## 2014-11-05 MED ORDER — DIPHENHYDRAMINE HCL 25 MG PO CAPS: 25.0000 mg | ORAL_CAPSULE | Freq: Three times a day (TID) | ORAL | Status: DC | PRN

## 2014-11-05 MED ORDER — AMLODIPINE BESYLATE 10 MG PO TABS: 10.0000 mg | ORAL_TABLET | Freq: Every day | ORAL | Status: DC

## 2014-11-05 MED ORDER — GUAIFENESIN 100 MG/5ML PO SYRP
100.0000 mg | ORAL_SOLUTION | Freq: Two times a day (BID) | ORAL | Status: DC | PRN
  Filled 2014-11-05: qty 5

## 2014-11-05 MED ORDER — ZOLPIDEM TARTRATE 5 MG PO TABS: 5.0000 mg | ORAL_TABLET | Freq: Every evening | ORAL | Status: DC | PRN

## 2014-11-05 MED ORDER — METHYLPREDNISOLONE SODIUM SUCC 125 MG IJ SOLR: 60.0000 mg | Freq: Three times a day (TID) | INTRAMUSCULAR | Status: DC

## 2014-11-05 MED ORDER — PROMETHAZINE HCL 25 MG/ML IJ SOLN
12.5000 mg | INTRAMUSCULAR | Status: DC | PRN
Start: 2014-11-06 — End: 2014-11-05

## 2014-11-05 MED ORDER — INSULIN ASPART 100 UNIT/ML ~~LOC~~ SOLN: 0.0000 [IU] | Freq: Three times a day (TID) | SUBCUTANEOUS | Status: DC

## 2014-11-05 MED ORDER — FUROSEMIDE 20 MG PO TABS: 20.0000 mg | ORAL_TABLET | Freq: Every day | ORAL | Status: DC

## 2014-11-05 MED ORDER — IPRATROPIUM-ALBUTEROL 0.5-2.5 (3) MG/3ML IN SOLN: 3.0000 mL | RESPIRATORY_TRACT | Status: DC

## 2014-11-05 MED ORDER — METOPROLOL SUCCINATE ER 25 MG PO TB24: 25.0000 mg | ORAL_TABLET | Freq: Every day | ORAL | Status: DC

## 2014-11-05 MED ORDER — MORPHINE SULFATE 2 MG/ML IJ SOLN: 2.0000 mg | INTRAMUSCULAR | Status: DC | PRN

## 2014-11-05 MED ORDER — ALPRAZOLAM 1 MG PO TABS: 1.0000 mg | ORAL_TABLET | Freq: Two times a day (BID) | ORAL | Status: DC | PRN

## 2014-11-05 MED ORDER — PANTOPRAZOLE SODIUM 40 MG PO TBEC: 40.0000 mg | DELAYED_RELEASE_TABLET | Freq: Every day | ORAL | Status: DC

## 2014-11-05 MED ORDER — ACETAMINOPHEN 325 MG PO TABS: 650.0000 mg | ORAL_TABLET | Freq: Four times a day (QID) | ORAL | Status: DC | PRN

## 2014-11-05 MED ORDER — AMITRIPTYLINE HCL 50 MG PO TABS: 50.0000 mg | ORAL_TABLET | Freq: Every day | ORAL | Status: DC

## 2014-11-05 MED ORDER — NICOTINE 21 MG/24HR TD PT24: 21.0000 mg | MEDICATED_PATCH | TRANSDERMAL | Status: DC

## 2014-11-05 MED ORDER — CITALOPRAM HYDROBROMIDE 20 MG PO TABS: 20.0000 mg | ORAL_TABLET | Freq: Every day | ORAL | Status: DC

## 2014-11-05 MED ORDER — OXYCODONE HCL 5 MG PO TABS
15.0000 mg | ORAL_TABLET | Freq: Four times a day (QID) | ORAL | Status: DC | PRN
Start: 2014-11-06 — End: 2014-11-05

## 2014-11-05 MED ORDER — LEVETIRACETAM 500 MG PO TABS: 500.0000 mg | ORAL_TABLET | Freq: Two times a day (BID) | ORAL | Status: DC

## 2014-11-05 MED ORDER — PANCRELIPASE (LIP-PROT-AMYL) 12000-38000 UNITS PO CPEP: 24000.0000 [IU] | ORAL_CAPSULE | Freq: Three times a day (TID) | ORAL | Status: DC

## 2014-11-05 MED ORDER — GABAPENTIN 300 MG PO CAPS: 300.0000 mg | ORAL_CAPSULE | Freq: Three times a day (TID) | ORAL | Status: DC

## 2014-11-05 MED ORDER — SODIUM CHLORIDE 0.9 % IJ SOLN: 3.0000 mL | INTRAMUSCULAR | Status: DC | PRN

## 2014-11-05 MED ORDER — AZITHROMYCIN 250 MG PO TABS: 500.0000 mg | ORAL_TABLET | ORAL | Status: DC

## 2014-11-06 DIAGNOSIS — I10 Essential (primary) hypertension: Secondary | ICD-10-CM | POA: Diagnosis not present

## 2014-11-06 LAB — STOOL CULTURE

## 2014-11-06 IMAGING — CR DG LUMBAR SPINE 2-3V
1 series · 6 of 6 positions shown · non-contrast
Comparison: none

REASON FOR EXAM: back pain
COMMENTS:

PROCEDURE:     DXR - DXR LUMBAR SPINE AP AND LATERAL  - November 26, 2012 [DATE]
RESULT:     Comparison: None.

[Series 1: ap · 0.17mm/px · 6 of 6 slices shown]
[im 1/6]
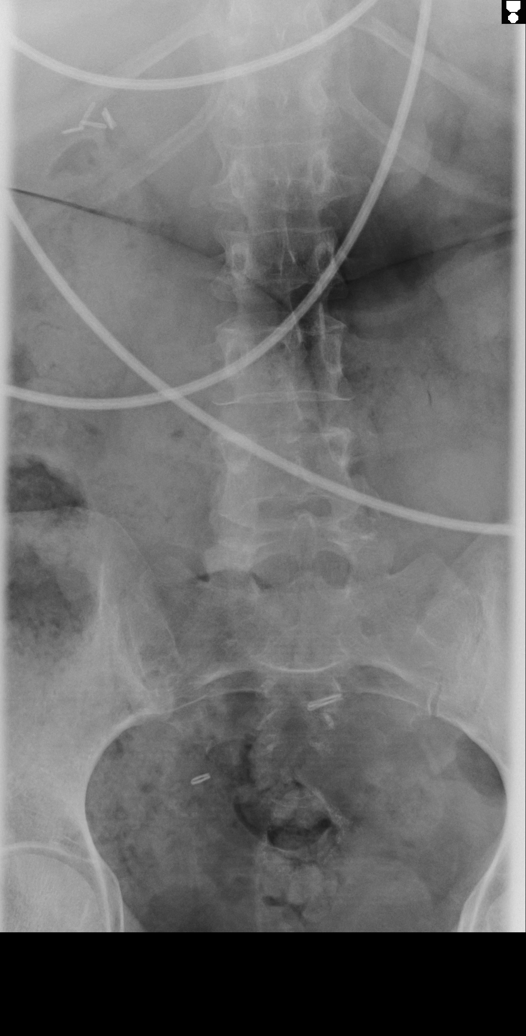
[im 2/6]
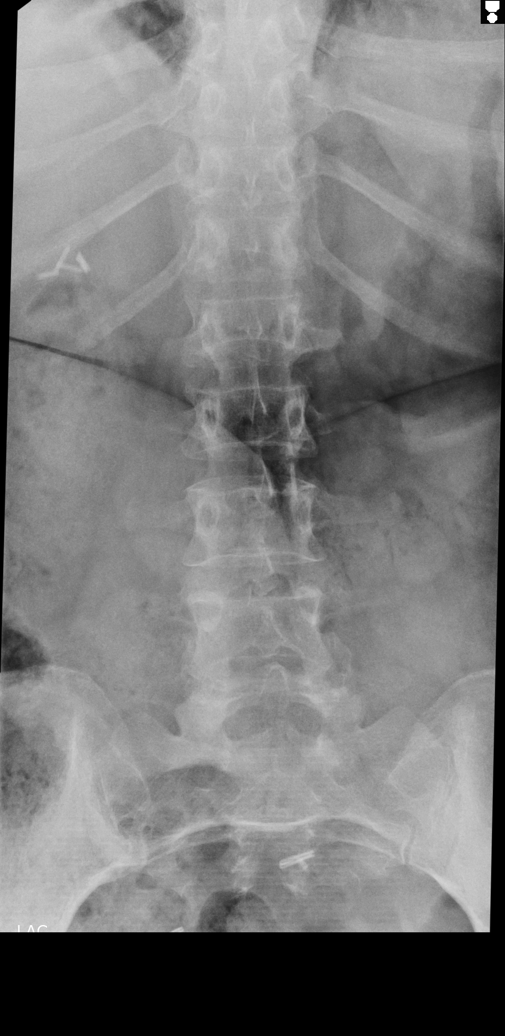
[im 3/6]
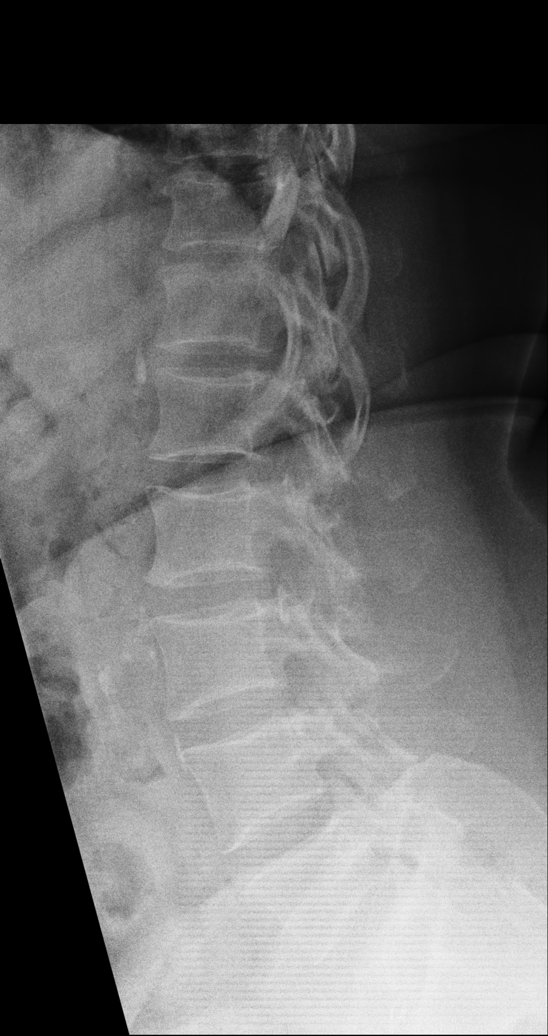
[im 4/6]
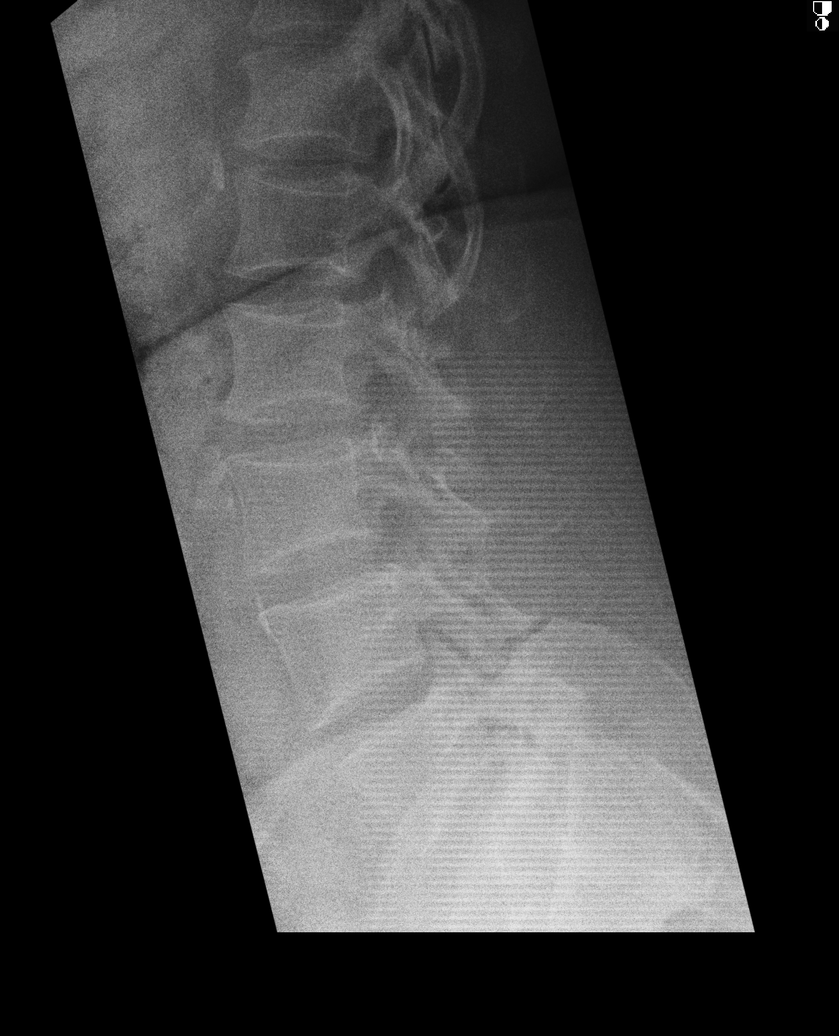
[im 5/6]
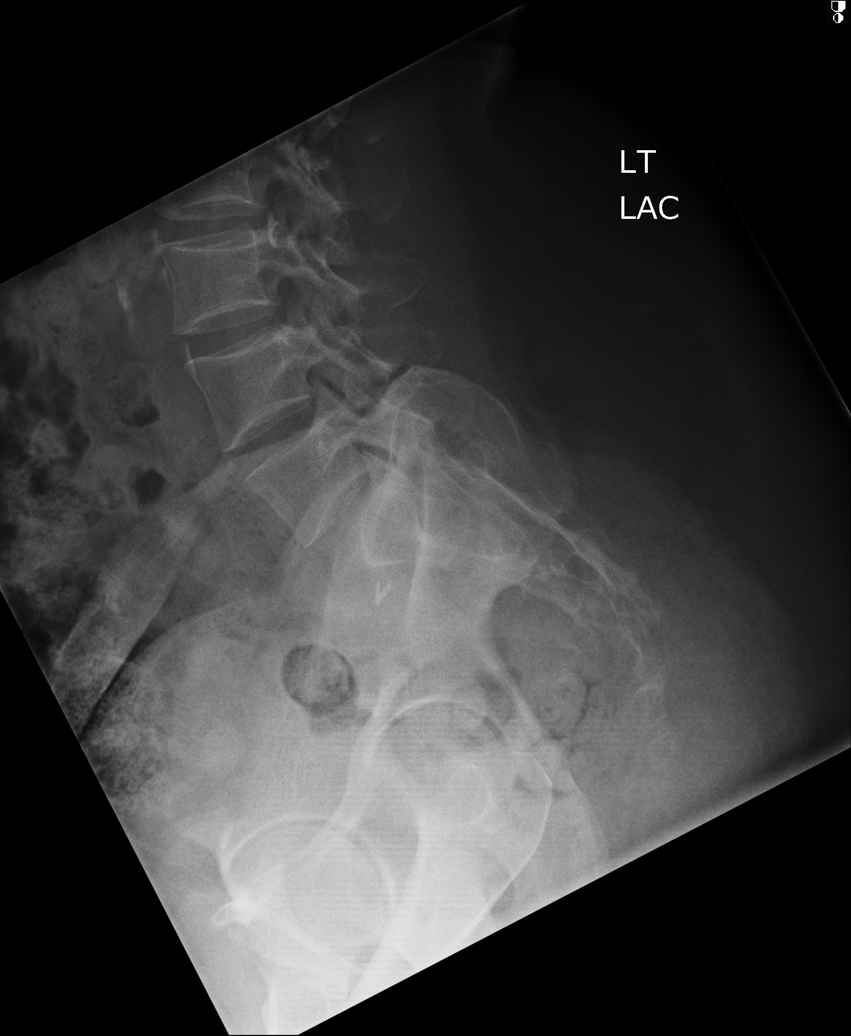
[im 6/6]
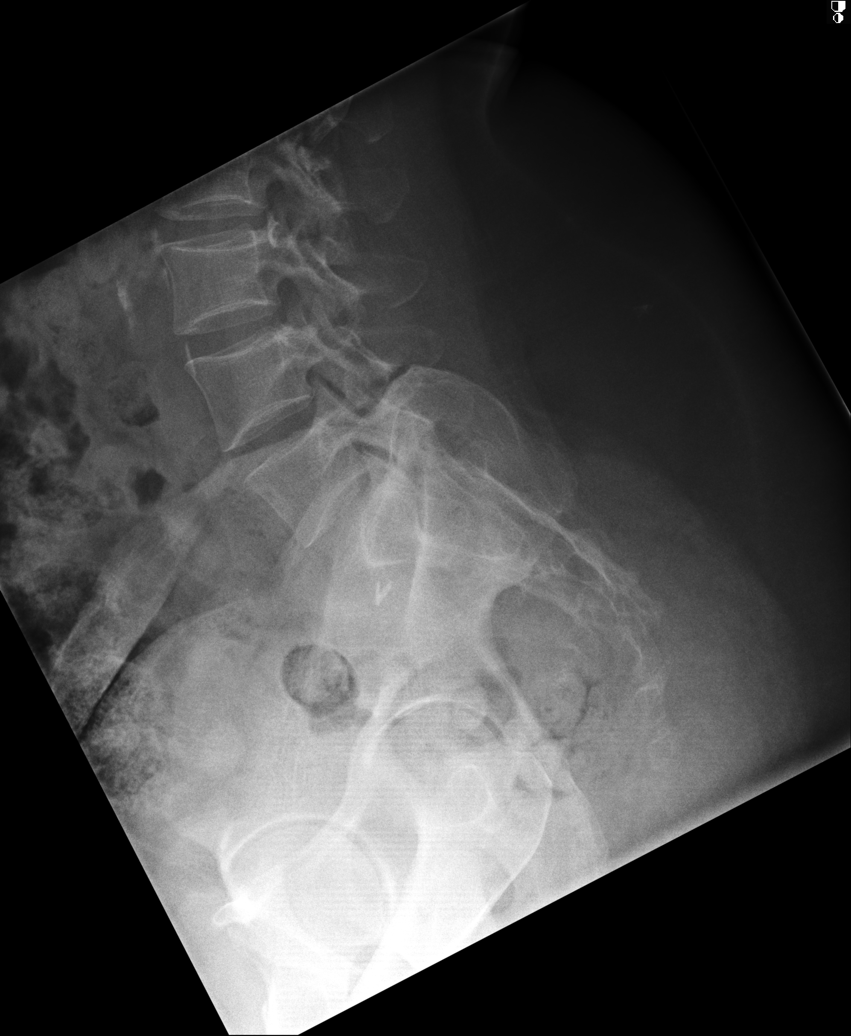

[6 of 6 positions shown; findings below may reference images not displayed]

FINDINGS: There are 5 lumbar type vertebral bodies. Vertebral body heights and
intervertebral disc heights are relatively preserved. There are
calcifications in the region of the abdominal aorta.
IMPRESSION: No acute findings.

[REDACTED]

## 2014-11-06 NOTE — H&P (Signed)
PATIENT NAME:  [name], [name] MR#:  [id number] DATE OF BIRTH:  1956-02-25  DATE OF ADMISSION:  11/03/2014  The patient was examined by me before midnight of 11/02/2014.   REFERRING PHYSICIAN: Larae Grooms, MD  PRIMARY CARE PRACTITIONER: Meindert A. Brunetta Genera, MD   ADMITTING PHYSICIAN: Juluis Mire, MD   CHIEF COMPLAINT:  1.  Shortness of breath with wheezing associated with cough with expectoration of yellow sputum for the past 2 days.  2.  Nausea, vomiting, and diarrhea with black stools.   HISTORY OF PRESENT ILLNESS: A [name] with a history of COPD, diabetes mellitus type 2, hypertension, coronary artery disease/MI, cirrhosis/hepatitis C, depression, pancreatitis, presents with the complaints of shortness of breath with wheezing with associated cough with expectoration of greenish-yellow sputum for the past 2 days. The patient stated that she started developing upper respiratory symptoms, which worsened with increasing cough with expectoration of greenish-yellow sputum and later developed shortness of breath with wheezing, hence came to the Emergency Room for further evaluation. The patient also gives a history of ongoing nausea, vomiting, and diarrhea for the past 2 days and has been having some black/dark-colored stools. Not sure if there is any blood in the stools or in the vomitus. Chronic lower abdominal pain present. She did have some mild low grade fever at home. Denies any chest pain, but her lower chest hurts because of coughing. In the Emergency Room, the patient was evaluated by the ED physician and was noted to have temperature of 99.3 degrees Fahrenheit with oxygen saturation of 98% on room air and with stable vital signs. The patient was noted to have acute exacerbation of COPD and further workup revealed a chest x-ray negative for any acute cardiopulmonary pathology and lab work with mild anemia and a mildly elevated liver function tests and lipase.  The patient received IV Solu-Medrol and multiple DuoNebs in the Emergency Room but continued to have significant wheezing with shortness of breath, hence hospitalist service was for further evaluation and management. At the present time, the patient states that she is slightly feeling better following treatment in the Emergency Room.   PAST MEDICAL HISTORY:  1.  COPD.  2.  Diabetes mellitus, type 2.  3.  Hypertension.  4.  Coronary artery disease/MI.  5.  Cirrhosis of the liver/hepatitis C.  6.  Depression.  7.  Pancreatitis.  8.  Chronic rectovaginal fistula.  9.  Renal insufficiency.   PAST SURGICAL HISTORY:  1.  Brain surgery.  2.  Hysterectomy.  3.  Rectovaginal fistula repair.  4.  Knee replacement.  5.  History of colostomy and reversal.  6.  Appendectomy.  7.  Cholecystectomy.   ALLERGIES:  1.  PENICILLIN.  2.  SULFA.  3.  DILAUDID.  4.  STRAWBERRY.   5.  FENTANYL.  6.  CHOCOLATE.  7.  IBUPROFEN.  [place].    SOCIAL HISTORY: She lives alone at home and she is divorced. History of smoking in the past; quit a few years ago. Denies any alcohol or substance abuse.    FAMILY HISTORY: Denies any known history of cardiovascular or pulmonary disease.   REVIEW OF SYSTEMS:  CONSTITUTIONAL: Positive for low-grade fever and fatigue and generalized weakness for the past 2-3 days.  EYES: Negative for blurred vision, double vision. No pain, no drainage, no discharge.  EARS, NOSE, AND THROAT: Negative for tinnitus, ear pain, hearing loss, epistaxis. She does have some nasal discharge.  RESPIRATORY: Positive for cough  with expectoration of greenish-yellow sputum associated shortness of breath and wheezing for the past 2-3 days. No hemoptysis. No painful respirations.  CARDIOVASCULAR: Negative for chest pain, palpitations, dizziness, syncope, orthopnea, pedal edema, or dyspnea on exertion.  GASTROINTESTINAL: Positive for nausea, vomiting, diarrhea for the past 2 days with the onset  of dark-colored stools. Lower abdominal pain, which is chronic present.  GENITOURINARY: Negative for dysuria, frequency, urgency.  ENDOCRINE: Negative for polyuria, nocturia, heat or cold intolerance.  HEMATOLOGIC: Negative for anemia, easy bruising or bleeding.  INTEGUMENTARY: Negative for acne, skin rash, or lesions.  MUSCULOSKELETAL: Positive for chronic arthritis, stable on medications.  NEUROLOGIC: Negative for focal weakness or numbness of CVA, TIA, or seizure disorder.  PSYCHIATRIC: Positive for history of depression, stable on home medications.   PHYSICAL EXAMINATION:  VITAL SIGNS: Temperature 99.3 degrees Fahrenheit, pulse rate 115 per minute, respirations 25 per minute, blood pressure 140/81, oxygen saturation 99% on room air.  GENERAL: Well developed, well nourished, alert, in no acute distress, anxious.  HEAD: Atraumatic, normocephalic.  EYES: Pupils equal, react to light and accommodation. No conjunctival pallor. No icterus. Extraocular movements intact.  NOSE: No drainage. No lesions.  EARS: No drainage. No external lesions.  ORAL CAVITY: No mucosal lesions. No exudates.  NECK: Supple. No JVD. No thyromegaly. No carotid bruit. Range of motion of neck within normal limits.  RESPIRATORY: Good respiratory effort. Not using accessory muscles of respiration. Bilateral air entry present. Bilateral diffuse rhonchi present. No rales.  CARDIOVASCULAR: S1, S2 regular. No murmurs, gallops, or clicks. Pulses equal at carotid, femoral, and pedal pulses. No peripheral edema.  GASTROINTESTINAL: Abdomen soft, obese. Diffuse mild tenderness. No guarding. No rigidity. Bowels are present and equal in all 4 quadrants.  GENITOURINARY: Deferred.  MUSCULOSKELETAL: Range of motion adequate. No joint tenderness or effusion. Strength and tone equal bilaterally.  SKIN: Inspection within normal limits. No obvious wounds.  LYMPHATIC: No cervical lymphadenopathy.  VASCULAR: Good dorsalis pedis and posterior  tibial pulses.  NEUROLOGIC: Alert, awake, and oriented x 3. Cranial nerves II-XII grossly intact. No sensory deficit. Motor strength is 5/5 in both upper and lower extremities. DTRs 2+ bilaterally and symmetrical. Plantars downgoing.  PSYCHIATRIC: Alert, awake, and oriented x 3. Judgment and insight adequate. Memory and mood within normal limits.   ANCILLARY DATA: LABORATORY DATA: Serum glucose 96, BUN 10, creatinine 0.86, sodium 141, potassium 3.3, chloride 106, bicarbonate 24, calcium 8.9, lipase 65, total protein 8.1, total bilirubin 1.4, alkaline phosphatase 122, AST 42, ALT 19. Troponin less than 0.03. WBC is 4.5, hemoglobin 10.9, hematocrit 34.8, platelet count 128,000. Influenza test negative.   IMAGING STUDIES: Chest x-ray: No edema or consolidation.    EKG: Sinus tachycardia with ventricular rate of 107 beats per minute, no acute ST-T changes.    ASSESSMENT AND PLAN: A 59 year old [place] American female with a history of chronic obstructive pulmonary disease, hepatitis C status post treatment, hypertension, coronary artery disease/myocardial infarction, depression, diabetes mellitus type 2 presents with shortness of breath, wheezing, cough with productive sputum of 3 days' duration also having nausea, vomiting, diarrhea with dark stool for the past 1-2 days.  1.  Chronic obstructive pulmonary disease exacerbation secondary to acute bronchitis. Plan: Admit to medical floor. Oxygen supplementation, intravenous Solu-Medrol, sputum for culture and sensitivity, vigorous DuoNebs, azithromycin to cover atypicals, and follow oxygen saturations.  2.  Nausea, vomiting, and diarrhea. Likely gastroenteritis, rule out infectious causes. Plan: Stool culture and sensitivity, stool guaiacs to check for any gastrointestinal bleed. Monitor serial hemoglobin and  hematocrit and further workup accordingly.  3.  Hypokalemia, mild. Replace potassium. Follow up BMP.  4.  Hypertension, stable on home medication.  Continue same. 5.  Diabetes mellitus, type 2. We will hold oral agents and continue sliding scale insulin. Follow blood sugars.  6.  Coronary artery disease/myocardial infarction, stable currently. No acute problems.  7.  History of hepatitis C/cirrhosis, status post treatment comes in with nausea, vomiting, and diarrhea with dark stools. Monitor for now. Gastroenterology consultation requested for further advice.  8.  History of pancreatitis. Lipase is mildly high, but seems chronic. Monitor for now and follow up lipase.  9.  Depression, stable on home medication. Continue home medications.  10.  Rectovaginal fistula, which is chronic. Plan: Monitor for now. Consider surgical evaluation if any acute problems. Otherwise, follow up as an outpatient.  11.  Deep vein thrombosis prophylaxis. TED and sequential compression devices. No heparin because of questionable gastrointestinal bleed.  12.  Gastrointestinal prophylaxis. Proton pump inhibitor.   CODE STATUS: Full code.   TIME SPENT: 50 minutes.   ____________________________ Juluis Mire, MD enr:bm D: 11/03/2014 01:32:16 ET T: 11/03/2014 02:06:01 ET JOB#: [id number]  cc: Juluis Mire, MD, <Dictator> Meindert A. Brunetta Genera, MD Juluis Mire MD ELECTRONICALLY SIGNED 11/03/2014 18:05

## 2014-11-06 NOTE — Consult Note (Signed)
Pt seen and examined. Please see C. London's notes. Multiple hosp admissions over time for various complaints. Has had surgery for colovaginal fistula. Had evaluation by Dr. Allen Norris and Dr. Leanora Cover last year for evaluation. Findings were neg. Insists seeing more stool per her vagina. States Dr. Marcelline Mates confirmed fistula via various studies and that Dr. Jamal Collin already evaluated her but felt she was not a candidate for surgery at this time. Cannot find any confirmation of these facts. Also, states she finished treatment for Hep C in [place] by ID. Her overall nausea is better. On PPI. Does have hx of gastritis in 2015. Before subjecting her to either flex sig or repeat CT, let's get records from Dr. Andreas Blower office1st. Fistulas are often to difficult to see during endoscopy. Will follow.  Electronic Signatures: Verdie Shire (MD)  (Signed on 28-Apr-16 15:56)  Authored  Last Updated: 28-Apr-16 15:56 by Verdie Shire (MD)

## 2014-11-06 NOTE — H&P (Signed)
PATIENT NAME:  [name], [name] MR#:  [id number] DATE OF BIRTH:  1955/07/21  DATE OF ADMISSION:  09/30/2014  REFERRING PHYSICIAN: Larae Grooms, MD  PRIMARY CARE PHYSICIAN: Meindert A. Brunetta Genera, MD.  OTHER PHYSICIANS: Follows with pain clinic, [facility].  CHIEF COMPLAINT: Hemoptysis.   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 Describes 1-week duration of cough productive of greenish sputum with associated dyspnea on exertion and shortness of breath. Saw her PCP and diagnosed with community-acquired pneumonia. Started on Macrobid but has had no improvement of symptoms. Was at a routine visit with her pain clinic today, had an episode of hemoptysis, thus sent to the hospital for further workup and evaluation. She denies any fevers, chills, further symptomatology. Does have a multitude of complaints, mainly of her chronic conditions.   REVIEW OF SYSTEMS: CONSTITUTIONAL: Denies fevers, chills, fatigue, weakness.  EYES: Denies blurred vision, double vision, eye pain.  EARS, NOSE, AND THROAT: Denies tinnitus, ear pain, or hearing loss.  RESPIRATORY: Positive for cough, wheeze, shortness of breath, hemoptysis.  CARDIOVASCULAR: Denies chest pain, palpitations, edema.  GASTROINTESTINAL: Positive for nausea, vomiting, diarrhea. Denies any abdominal pain.  GENITOURINARY: Denies dysuria, hematuria. ENDOCRINE: Denies nocturia or thyroid problems. HEMATOLOGIC AND LYMPHATIC: Denies easy bruising, bleeding. SKIN: Denies rash or lesions.  MUSCULOSKELETAL: Denies pain in neck, back, shoulder, knees, hips, or arthritic symptoms.  NEUROLOGIC: Denies paralysis, paresthesias.  PSYCHIATRIC: Denies anxiety or depressive symptoms.  Otherwise, full review of systems performed by  me is negative.  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   SOCIAL HISTORY: Remote tobacco, alcohol, and crack cocaine usage. Denies any current.   FAMILY HISTORY: Denies any known cardiovascular or pulmonary disorders.   ALLERGIES: INCLUDE DILAUDID, FENTANYL, IBUPROFEN, PENICILLIN, SULFA DRUGS, CHOCOLATE, AND STRAWBERRIES.    HOME MEDICATIONS: Include oxycodone 50 mg p.o. q.4 hours as needed for pain; tramadol 50 mg q.8 hours as needed for pain; gabapentin 300 mg p.o. at bedtime; Keppra 500 mg p.o. daily; amitriptyline 50 mg at bedtime; metformin 1000 mg at bedtime; promethazine 25 mg 3 times a day as needed for nausea, vomiting; Harvoni 90/400 mg p.o. daily; alprazolam 1 mg p.o. b.i.d. as needed for anxiety; hydroxyzine 25 mg 1 capsule 3 times a day as needed for itching; Ambien 10 mg p.o. at bedtime as needed for sleep; metoprolol succinate 25 mg; Anoro Ellipta 62.5/25 ; Creon 12,000/38,000/60,000, 2 capsules 3 times a day with meals; Lasix 20 mg at bedtime; Prilosec 20 mg b.i.d.   PHYSICAL EXAMINATION:  VITAL SIGNS: Temperature 98.3, heart rate 98, respirations 20, blood pressure 116/101, saturating 97% on room air. Weight 122.5 kg, BMI 41.1. GENERAL: Well-appearing African American female currently in no acute distress.  HEAD: Normocephalic, atraumatic.  EYES: Pupils equal, round, reactive to light. Extraocular muscles intact. No scleral icterus.  MOUTH: Moist mucosal membrane. Dentition intact. No abscess noted. EARS, NOSE, THROAT: Clear without exudates. No external lesions. NECK: Supple. No thyromegaly. No nodules. No JVD. PULMONARY: Diffuse wheezing throughout all lung fields. No frank crackles or rhonchi. No use of accessory  muscles. Good respiratory effort. CHEST: Nontender to palpation.  CARDIOVASCULAR: S1, S2, regular rate and rhythm. No murmurs, rubs,  or gallops. No edema. Pedal pulses 2+ bilaterally. GASTROINTESTINAL: Soft, nontender, nondistended, obese. Positive bowel sounds. No appreciable hepatosplenomegaly. MUSCULOSKELETAL: No swelling, clubbing, or edema. Range of motion full in all extremities.  NEUROLOGIC: Cranial nerves II through XII intact. No gross focal neurologic deficits. Sensation intact. Reflexes intact.  SKIN: No ulceration, lesions, rashes, cyanosis. Skin warm, dry. Turgor intact. PSYCHIATRIC: Mood and affect within normal limits. Patient awake, alert, oriented x 3. Insight and judgment intact.   LABORATORY DATA: Sodium of 139, potassium 2.8, chloride of 105, bicarbonate of 22, BUN 7, creatinine 0.86, glucose 113. LFTs: Total protein 8.7, bilirubin 1.6, otherwise within normal limits. WBC of 6.7, hemoglobin of 11, platelets of 195,000. Urinalysis: WBCs 2, RBCs 1, negative for evidence of infection.  IMAGING: Chest x-ray: No acute cardiopulmonary process. CT, chest, performed; no evidence of PE.   ASSESSMENT AND PLAN: A 59 year old [place] American female with a history of type 2 diabetes, non-insulin-requiring; chronic obstructive pulmonary disease; as well as cirrhosis secondary to hepatitis C and alcohol; presenting with hemoptysis.  1.  Chronic obstructive pulmonary disease exacerbation. Azithromycin. Supplemental oxygen to keep SaO2 greater than 92%. DuoNeb treatments q.4 hours. Steroids, Solu-Medrol 60 mg IV daily. If hemoptysis worsens, we will consult pulmonary. No indication at this time.  2.  Hypokalemia. Replace potassium, goal of 4 to 5.  3.  Type 2 diabetes non-insulin-requiring. Hold p.o. agents. Add insulin sliding scale with q.6 hour Accu-Cheks. 4.  Hypertension, essential. Continue Norvasc.  5.  Venous thromboembolism prophylaxis with sequential compression devices.  CODE  STATUS: Patient full code.  TIME SPENT: 55 minutes.   ____________________________ Aaron Mose. Hower, MD dkh:ST D: 09/30/2014 21:21:54 ET T: 09/30/2014 22:43:00 ET JOB#: [id number]  cc: Aaron Mose. Hower, MD, <Dictator> DAVID Woodfin Ganja MD ELECTRONICALLY SIGNED 10/01/2014 1:37

## 2014-11-06 NOTE — Consult Note (Signed)
Brief Consult Note: Diagnosis: copd exacerbation.   Patient was seen by consultant.   Consult note dictated.   Comments: Appreciate consult for 59 yo [place] American woman with complex health history, admitted with Copd exacerbation, for evaluation of HCV, and NVD. Patient is a difficult historian. Report issues with nausea and vomitng when she eats. States she has lower abdominal pain and frequent diarrhea. 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 States she has frequent pruritis in the perianal area due to this and scratches often. Feels like she has a foul odor. Sometimes sees blood in her stool and has chronic lower abdominal and LUQ pain. had a past low anterior resection/partial colectomy wtih colostomy placement and reversal in 2006/2007)  Did have evaluation for the fistula with Dr Leanora Cover and Allen Norris 3/15: colonoscopy was noted to have a poor prep, found 2 polyps, but no evidence of a colo- or recto-vaginal fistuls. Had a CT following with rectal and IV constrast that also did not demonstrate this finding of concern. Discussed this with pt: states it has been more recently id'd by Dr Marcelline Mates of [facility] and that she had referral with Dr Jamal Collin who felt her co-morbidities precluded surgical repair. No other GI complaints. states she was treated at the [facility] ID clinic wtih [place] for 12w, finished last week. Impression and plan: NV- improved, no longer problematic.                               Diarrhea- agree with stool studies                               Possible colovaginal fistula- would like records from Dr Marcelline Mates, flex sig under consideration                                HCV- viral load.  Electronic Signatures: Stephens November H (NP)  (Signed 28-Apr-16 15:41)  Authored: Brief Consult Note   Last Updated: 28-Apr-16 15:41 by Theodore Demark (NP)

## 2014-11-06 NOTE — Consult Note (Addendum)
PATIENT NAME:  [name], [name] MR#:  [id number] DATE OF BIRTH:  1956-03-10  DATE OF CONSULTATION:  11/03/2014  REFERRING PHYSICIAN:   CONSULTING PHYSICIAN:  Theodore Demark, NP  Gastroenterology consult ordered for Dr. Reece Levy for evaluation of hepatitis C, nausea, vomiting, diarrhea.   HISTORY OF PRESENT ILLNESS: I appreciate consult for a 59 year old [place] American woman with a complex health history admitted with COPD exacerbation for evaluation of hepatitis C, nausea, vomiting, diarrhea. The patient is a difficult historian. Reports issues with nausea and vomiting when she eats.  States she has lower abdominal pain and frequent diarrhea. 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 States she has frequent pruritus in perianal area due to this and scratches there often.  It feels like she has a foul odor. Sometimes sees blood in her stool and has chronic lower abdominal and left upper quadrant pain.  Did have in the past a lower anterior resection/partial colectomy with colostomy placement and reversal in 2006 and 2007. Did have evaluation for recurrent fistula with Dr. Leanora Cover, Dr. Allen Norris last March 2015. Colonoscopy was noted to have a poor prep, found 2 polyps, but no evidence of colo or rectovaginal fistula. Head CT following with rectal and IV contrast also did not demonstrate this finding or concerns.  Discussed this with the patient.  The patient states it has been more recently identified by Dr. Marcelline Mates at [facility] and she has had referral with Dr. Jamal Collin who fell her comorbidities precluded surgical repair. Currently, she has no other GI complaints. Does report she was treated in [place] at infectious disease clinic with [facility] for 12 weeks and finished last week. Stool studies have been ordered to assess her diarrhea. She says she has had no further nausea and vomiting since she is been admitted and she would like some more food.  She has been having  ice cream and some other milder foods today.   PAST MEDICAL HISTORY: Paranoid schizophrenia, chronic abdominal pain, chronic pancreatitis, hepatitis C, cirrhosis, GERD, sepsis, chronic anemia, drug-seeking behavior, alcohol abuse, colovaginal fistula repair with colostomy reversal, EGD in 2014 with Dr. Candace Cruise with a duodenal polyp. Repeat EGD attempted in 2014.  This was stopped due to desaturation. EGD 2013, chronic gastritis, duodenal polyp.  Seizure disorder, insomnia, COPD, UTI, depression, total knee replacement, craniotomy, cholecystectomy, appendectomy, partial hysterectomy. Did also have colonoscopy in March 2015 with 2 polyps and an EGD in March 2015 with some gastritis.  Esophagus was noted to be normal, duodenum was noted to be normal.   MEDICATIONS: Albuterol nebulizer, alprazolam 1 mg b.i.d., amitriptyline 50 mg 1 tablet daily, amlodipine 10 mg p.o. at bedtime, Ellipta inhaler 62.5/25 mcg once a day, Omnicef 1 capsule b.i.d., citalopram 20 mg once a day, Creon 12,000/38,000/60,000 units 2 capsules t.i.d. with meals, Lasix 20 mg once a day, gabapentin 300 mg 3 times a day, hydroxyzine 20 mg t.i.d. p.r.n., Keppra 500 mg once a day, metformin 1000 mg once a day, metoprolol 25 mg once a day, omeprazole 20 mg once a day, oxycodone 15 mg q. 6 hours p.r.n., ProAir albuterol inhaler q. 4 hours p.r.n., promethazine 25 mg t.i.d. p.r.n., Zantac 1 capsule b.i.d. p.r.n., Terazol vaginal cream p.r.n., tramadol 50 mg 2 tablets t.i.d. p.r.n., Ambien 10 mg once a day at bedtime p.r.n.   ALLERGIES: PENICILLIN, SULFA, DILAUDID, STRAWBERRIES, FENTANYL, CHOCOLATE, IBUPROFEN, ZOFRAN.   SOCIAL HISTORY: Lives alone, history of smoking in the past, history of alcohol in the past,  questionable for substance abuse.   FAMILY HISTORY: Unknown.   REVIEW OF SYSTEMS: 10 systems reviewed.  Noted for low-grade fever, fatigue, cough with yellow phlegm, chronic arthralgias, otherwise unremarkable as noted above and agree with H  and P.   MOST RECENT LABORATORY DATA:  Glucose 305, BUN 12, creatinine 0.84, sodium 136, potassium 3.9, GFR greater than 60, calcium 8.7, lipase 65, total protein 7.2, albumin 3.1, total bilirubin 0.8, alkaline phosphatase 115, AST 34, ALT 15. Troponin less than 0.03. WBC 7.8, hemoglobin 10.1, hematocrit 32.3, platelet count 110,000.  Flu negative.  Chest x-ray without edema or consolidation in her chest.   PHYSICAL EXAMINATION:  MOST RECENT VITAL SIGNS:  Temperature 97.8, pulse 99, respiratory rate 19, blood pressure 100/61, Sa02 98% on room air.  GENERAL:  Well-appearing, middle-aged woman in no acute distress.  HEENT: Normocephalic, atraumatic. Conjunctivae pink. Sclerae are clear.  NECK: Supple. No mass or thyromegaly.  CHEST: Respirations eupneic. Lungs clear.  CARDIAC: S1, S2, regular rate and rhythm. No mitral regurgitation.  ABDOMEN: Obese, moderate size panniculus. Bowel sounds x 4. Soft, nondistended. Some tenderness to the lower abdomen and the left upper quadrant abdomen, although this was not extreme. There are no peritoneal signs, rebound tenderness, guarding, rigidity, or other abnormalities. There is a well-healing scar in the lower anterior abdomen.  EXTREMITIES: Moves all extremities well x 4. Strength 5/5. No edema.  SKIN: Warm, dry, pink. No erythema, lesion, or rash.  RECTAL: Stool is a medium brown with a slight green tint.  Her vagina and her anus are very close.  There does appear to be a small communication between the anus and the vagina. There are no signs of blood other than a trace heme-positive result on her stool card. There was no rectal mass.  NEUROLOGIC: Alert, oriented x 3. Cranial nerves II through XII intact. Speech clear. No facial droop.  PSYCHIATRIC: Pleasant, calm, cooperative.   IMPRESSION AND PLAN:  1.  Nausea and vomiting, improved. No longer problematic. 2.  Diarrhea. Agree with stool studies. She has been on antibiotics and this may be a side  effect. 3.  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 Would like her records from Dr. Marcelline Mates.  Flexible sigmoidoscopy under sedation is being considered. 4.  History of hepatitis C.  Will obtain viral load additionally as the patient has reported she has received treatment for this.   Thank you very much for this consult. These services were provided by Stephens November, MSN, [facility], in collaboration with Dr. Verdie Shire, M.D. with whom I have discussed this patient in full.    ____________________________ Theodore Demark, NP chl:sp D: 11/03/2014 17:23:12 ET T: 11/03/2014 17:39:47 ET JOB#: [id number]  cc: Theodore Demark, NP, <Dictator> [place] SIGNED 11/29/2014 12:56

## 2014-11-06 NOTE — Discharge Summary (Signed)
PATIENT NAME:  [name] DATE OF BIRTH:  01-14-1956  DATE OF ADMISSION:  09/30/2014 DATE OF DISCHARGE:  10/03/2014  ADMITTING DIAGNOSIS:  Cough, shortness of breath, scant hemoptysis.   DISCHARGE DIAGNOSES: 1.  Shortness of breath due to chronic obstructive pulmonary disease exacerbation with acute bronchitis, now improved.  2.  Chronic difficulty with vaginal fistula, needs outpatient followup. Primary care needs to arrange this.  3.  Hypokalemia, status post replacement.  4.  Diabetes type 2.  5.  Essential hypertension.  6.  Liver cirrhosis.  7.  Chronic pain syndrome.  8.  History of hepatitis C.  9.  History of seizure disorder.  10.  History of cerebrovascular accident without residual deficits.  11.  Type 2 diabetes, non-insulin-requiring.  12.  Remote tobacco use, remote alcohol and crack cocaine use.   CONSULTANTS:  None.   PERTINENT LABS AND EVALUATIONS:  Admitting glucose was 113, BUN 7, creatinine 0.86, sodium 139, potassium 2.8, chloride 105, CO2 of 22, calcium 8.7, albumin 3.6, and total bilirubin was 1.6. WBC was 6.7, hemoglobin 11, and platelet count was 195,000.   CT scan per PE protocol showed no PE, scattered coronary calcification, cirrhosis. Chest x-ray showed no active cardiopulmonary processes.   HOSPITAL COURSE:  Please refer to H and P done by the admitting physician. The patient is a 59 year old African-American female who has had 9 ER visits within the past 6 months and 1 admission, who presented with complaint of some cough and scant hemoptysis associated with the cough. The patient was placed under observation. However, she continued to complain of shortness of breath and did not want to go home. The patient was kept on nebulizers, inhalers, steroids, and antibiotics. The patient also has presented to the ED multiple times with the complaint of vaginal fistula and apparently has not followed up appropriately with physicians; this needs to be  addressed as an outpatient. Her breathing is stable. She is not on any oxygen and is stable for discharge.   DISCHARGE MEDICATIONS:  Ambien 10 at bedtime p.r.n., amitriptyline 50 at bedtime,   1 puff daily as needed, omeprazole 20 daily, tramadol 50 one to 2 tablets 3 times a day as needed, promethazine 25 one tab p.o. t.i.d. as needed, tramadol 50 one to 2 tabs t.i.d. as needed, promethazine 25 one tab p.o. t.i.d. p.r.n., Creon 2 capsules t.i.d., Lasix 20 daily, metoprolol succinate 25 p.o. daily, Harvoni 90 mg/400 mg 1 tab p.o. daily, gabapentin 300 daily, Keppra 500 one tab p.o. at bedtime, hydroxyzine 25 mg 1 cap t.i.d. as needed, alprazolam 1 mg b.i.d. as needed, amlodipine 10 daily, metformin 1000 one tab p.o. at bedtime, oxycodone 50 mg q. 6 p.r.n., prednisone taper starting at 60, taper by 10 until complete, guaifenesin 600 one tab p.o. b.i.d., azithromycin 500 mg p.o. daily x 4 days, guaifenesin 15 mL q. 6 p.r.n. for cough.   DIET:  Low sodium, low fat, low cholesterol, carbohydrate controlled.   ACTIVITY:  As tolerated.   FOLLOWUP:  With primary MD in 1 to 2 weeks.   TIME SPENT:  35 minutes on this discharge.    ____________________________ Lafonda Mosses. Posey Pronto, MD shp:nb D: 10/03/2014 15:03:51 ET T: 10/04/2014 04:34:45 ET JOB#: [id number]  cc: Demetria Lightsey H. Posey Pronto, MD, <Dictator> Alric Seton MD ELECTRONICALLY SIGNED 10/07/2014 14:17

## 2014-11-06 NOTE — Consult Note (Signed)
Chief Complaint:  Subjective/Chief Complaint Totally noncomplaint with meds/foods. Eating fried chicken and drinking mountain dew from home. Now, feeling nauseous with dypepsia. Insists Dr. Jamal Collin evaluated her last MOnday for colovaginal fistula. Records from Dr. Andreas Blower office still pending. C.diff neg   VITAL SIGNS/ANCILLARY NOTES: **Vital Signs.:   29-Apr-16 00:47  Vital Signs Type Routine  Temperature Temperature (F) 97.6  Celsius 36.4  Temperature Source oral  Pulse Pulse 84  Respirations Respirations 18  Systolic BP Systolic BP 97  Diastolic BP (mmHg) Diastolic BP (mmHg) 72  Mean BP 80  Pulse Ox % Pulse Ox % 92  Pulse Ox Activity Level  At rest  Oxygen Delivery Room Air/ 21 %   Brief Assessment:  GEN no acute distress   Cardiac Regular   Respiratory clear BS   Gastrointestinal mild diffuse tenderness   Lab Results:  Routine Micro:  29-Apr-16 05:07   Micro Text Report CLOSTRIDIUM DIFFICILE   C.DIFFICILE ANTIGEN       C.DIFFICILE GDH ANTIGEN : NEGATIVE   C.DIFFICILE TOXIN A/B     C.DIFFICILE TOXINS A AND B : NEGATIVE   INTERPRETATION            Negative for C. difficile.    ANTIBIOTIC                        Routine Sero:  29-Apr-16 05:07   Occult Blood, Feces POSITIVE (Result(s) reported on 04 Nov 2014 at 05:43AM.)   Assessment/Plan:  Assessment/Plan:  Assessment Hx of Hep C. N/V/D and possible colovaginal fistula.   Plan Colovaginal fistula is not an urgent issue unless acute infection accompanies. Either get comment from Dr. Jamal Collin or notes from his office regarding this issue. Await records from Dr. Andreas Blower office. See yesterday's notes. Needs to be compliant with meds/medical care before her GI condition will improve. Will have Dr. Rayann Heman check on patient over the weekend.   Electronic Signatures: Verdie Shire (MD)  (Signed 29-Apr-16 12:48)  Authored: Chief Complaint, VITAL SIGNS/ANCILLARY NOTES, Brief Assessment, Lab Results, Assessment/Plan   Last  Updated: 29-Apr-16 12:48 by Verdie Shire (MD)

## 2014-11-06 NOTE — H&P (Signed)
PATIENT NAME:  [name], CHEANEY MR#:  [id number] DATE OF BIRTH:  [birth year]  DATE OF ADMISSION:  11/03/2014  ADDENDUM   HOME MEDICATIONS:   1.  Albuterol inhalation solution every 6 hours as needed for shortness of breath.  2.  Alprazolam 1 mg 1 tablet orally 2 times a day as needed for anxiety.  3.  Amitriptyline 50 mg 1 tablet orally once a day at bedtime.  4.  Amlodipine 10 mg 1 tablet orally once a day.  5.  Ellipta 62.5/25 mcg inhalation powder 1 puff once a day.  6.  Citalopram 20 mg 1 tablet orally once a day.  7.  Creon 12,000 units/38,000 units/60,000 units oral capsule, 2 capsules 3 times a day.  8.  Furosemide 20 mg 1 tablet orally once a day.  9.  Gabapentin 300 mg 1 capsule orally 3 times a day.  10.  Hydroxyzine pamoate 25 mg oral capsule, 1 capsule 3 times a day as needed for itching.  11.Keppra 500 mg oral tablet, 1 tablet orally 2 times a day.  12.  Metformin 1000 mg 1 tablet orally 2 times a day.  13.  Metoprolol succinate 25 mg 1 tablet orally once a day at bedtime.  14.  Omeprazole 20 mg 1 tablet orally once a day.  15.  Oxycodone 15 mg 1 tablet orally every 6 hours as needed for pain.  16.  ProAir 90 mcg inhalation 2 to 4 puffs every 4 hours as needed.  17.  Promethazine 25 mg oral tablet, 1 tablet orally 3 times a day as needed for nausea, vomiting.  18.  Ranitidine 150 mg 1 capsule orally 2 times a day.  19.  Tramadol 50 mg oral tablet, 2 tablets orally 3 times a day as needed for pain.  20.  Zolpidem 10 mg 1 tablet orally once a day at bedtime as needed for sleep.    ____________________________ Juluis Mire, MD enr:AT D: 11/03/2014 02:00:00 ET T: 11/03/2014 03:10:43 ET JOB#: [id number]  cc: Juluis Mire, MD, <Dictator> Juluis Mire MD ELECTRONICALLY SIGNED 11/03/2014 18:11

## 2014-11-07 DIAGNOSIS — I10 Essential (primary) hypertension: Secondary | ICD-10-CM | POA: Diagnosis not present

## 2014-11-07 NOTE — Discharge Summary (Signed)
PATIENT NAME:  [name], [name] MR#:  [id number] DATE OF BIRTH:  05/09/1956  DATE OF ADMISSION:  11/02/2014 DATE OF DISCHARGE:  11/05/2014  DISCHARGE DIAGNOSES: 1. Chronic abdominal pain and gastroinestinal bleed.  2. Chronic obstructive pulmonary disease exacerbation.  3. Hypertension.  4. Diabetes.   CONDITION ON DISCHARGE: Stable.   CODE STATUS: Full code.  DISCHARGE MEDICATIONS:  1. Zolpidem 10 mg oral tablet once a day.  2. Amitriptyline 50 mg once a day.  3. Omeprazole 20 mg oral delayed-release capsule 2 times a day.  4. Promethazine 25 mg oral 3 times a day as needed for nausea and vomiting.  5. Creon pancreatic enzyme 2 capsules 3 times a day.  6. Furosemide 20 mg once a day.  7. Metoprolol 25 mg once a day.  8. Hydroxyzine 25 mg 3 times a day as needed for itching.  9. Alprazolam 1 mg oral tablet 2 times a day as needed for anxiety and restlessness. 10. Amlodipine 10 mg oral tablet once a day.  11. Oxycodone 15 mg oral tablet every 6 hours as needed for pain.  12. Ranitidine 150 mg 2 times a day.  13. Metformin 1000 mg oral 2 times a day.  14. Tramadol 50 mg oral 2 tablets 3 times a day.  15. Gabapentin 300 mg oral capsule 3 times a day.  16. Keppra  mg oral 2 times a day.  17. Prednisone 10 mg oral tablet, start at 60 mg and taper x 10 mg daily until complete.   DIET ON DISCHARGE: Low sodium, low fat, low-cholesterol, carbohydrate controlled ADA diet. Diet consistency: Regular.   ACTIVITY: As tolerated.   TIME FRAME TO FOLLOW UP: Within 1 to 2 weeks in GI clinic.   HISTORY OF PRESENT ILLNESS: A 59 year old, African-American female with history of COPD, diabetes type 2, hypertension. MI, liver cirrhosis who came with upper respiratory symptoms cough, also a history of ongoing nausea, vomiting and diarrhea for the past 2 days and had some dark stool also. Oxygen saturation was 98% on room air on presentation. Was given some Solu-Medrol, but still had some wheezing and so  given to hospitalist team for further management.   HOSPITAL COURSE AND STAY:  1. GI bleed: The patient had stool guaiac-positive, but unclear etiology for her GI bleed. She was following with GYN clinic as she started having some stool leaking from her vagina and was diagnosed to have rectovaginal fistula. All this history was given by the patient. Her hemoglobin remained stable in the hospital, so we discharged with further instruction to follow with Dr. Dorothey Baseman office. 2. Chronic obstructive pulmonary disease exacerbation. She was counseled to quit smoking. She was on IV steroids.  3. Type 2 diabetes, noninsulin requiring. She was on metformin and sliding scale coverage and remained stable.  4. Hypertension. We continued Norvasc.  5. Liver cirrhosis. Advised to follow with primary care physician.   CONSULTS [facility]: GI with Dr. Tana Conch.   IMPORTANT LABORATORY RESULTS: WBC count 4.5, hemoglobin is 10.9, platelet count 128,000, MCV 80. Glucose 96, BUN 10, creatinine 0.86, sodium 141, potassium 3.3, chloride 106 and CO2 of 24. Troponin less than 0.03. Influenza A and B are negative. Chest x-ray, portable single view showed no edema or consultation. WBC 3.8, hemoglobin 10, platelet count 100,000. C. difficile was tested, which was negative.   TOTAL TIME SPENT ON THIS DISCHARGE: 40 minutes.    ____________________________ Ceasar Lund Anselm Jungling, MD vgv:TT D: 11/05/2014 16:18:45 ET T: 11/06/2014 11:22:26 ET  JOB#: [id number]  cc: Ceasar Lund. Anselm Jungling, MD, <Dictator> [facility] Lucilla Lame, MD  Rosalio Macadamia [facility] MD ELECTRONICALLY SIGNED 11/06/2014 21:47

## 2014-11-08 DIAGNOSIS — I10 Essential (primary) hypertension: Secondary | ICD-10-CM | POA: Diagnosis not present

## 2014-11-09 DIAGNOSIS — I10 Essential (primary) hypertension: Secondary | ICD-10-CM | POA: Diagnosis not present

## 2014-11-10 DIAGNOSIS — I10 Essential (primary) hypertension: Secondary | ICD-10-CM | POA: Diagnosis not present

## 2014-11-11 DIAGNOSIS — I10 Essential (primary) hypertension: Secondary | ICD-10-CM | POA: Diagnosis not present

## 2014-11-12 DIAGNOSIS — I10 Essential (primary) hypertension: Secondary | ICD-10-CM | POA: Diagnosis not present

## 2014-11-13 DIAGNOSIS — I10 Essential (primary) hypertension: Secondary | ICD-10-CM | POA: Diagnosis not present

## 2014-11-14 DIAGNOSIS — I10 Essential (primary) hypertension: Secondary | ICD-10-CM | POA: Diagnosis not present

## 2014-11-15 DIAGNOSIS — I10 Essential (primary) hypertension: Secondary | ICD-10-CM | POA: Diagnosis not present

## 2014-11-16 DIAGNOSIS — I10 Essential (primary) hypertension: Secondary | ICD-10-CM | POA: Diagnosis not present

## 2014-11-17 DIAGNOSIS — I10 Essential (primary) hypertension: Secondary | ICD-10-CM | POA: Diagnosis not present

## 2014-11-18 DIAGNOSIS — I10 Essential (primary) hypertension: Secondary | ICD-10-CM | POA: Diagnosis not present

## 2014-11-19 DIAGNOSIS — I10 Essential (primary) hypertension: Secondary | ICD-10-CM | POA: Diagnosis not present

## 2014-11-20 DIAGNOSIS — I10 Essential (primary) hypertension: Secondary | ICD-10-CM | POA: Diagnosis not present

## 2014-11-21 DIAGNOSIS — I10 Essential (primary) hypertension: Secondary | ICD-10-CM | POA: Diagnosis not present

## 2014-11-22 DIAGNOSIS — I10 Essential (primary) hypertension: Secondary | ICD-10-CM | POA: Diagnosis not present

## 2014-11-23 DIAGNOSIS — I10 Essential (primary) hypertension: Secondary | ICD-10-CM | POA: Diagnosis not present

## 2014-11-24 DIAGNOSIS — I10 Essential (primary) hypertension: Secondary | ICD-10-CM | POA: Diagnosis not present

## 2014-11-25 DIAGNOSIS — I10 Essential (primary) hypertension: Secondary | ICD-10-CM | POA: Diagnosis not present

## 2014-11-26 DIAGNOSIS — I10 Essential (primary) hypertension: Secondary | ICD-10-CM | POA: Diagnosis not present

## 2014-11-27 DIAGNOSIS — I10 Essential (primary) hypertension: Secondary | ICD-10-CM | POA: Diagnosis not present

## 2014-11-28 DIAGNOSIS — I10 Essential (primary) hypertension: Secondary | ICD-10-CM | POA: Diagnosis not present

## 2014-11-29 DIAGNOSIS — I10 Essential (primary) hypertension: Secondary | ICD-10-CM | POA: Diagnosis not present

## 2014-11-30 DIAGNOSIS — I10 Essential (primary) hypertension: Secondary | ICD-10-CM | POA: Diagnosis not present

## 2014-12-01 DIAGNOSIS — I10 Essential (primary) hypertension: Secondary | ICD-10-CM | POA: Diagnosis not present

## 2014-12-02 DIAGNOSIS — F418 Other specified anxiety disorders: Secondary | ICD-10-CM | POA: Diagnosis not present

## 2014-12-02 DIAGNOSIS — N823 Fistula of vagina to large intestine: Secondary | ICD-10-CM | POA: Diagnosis not present

## 2014-12-02 DIAGNOSIS — L298 Other pruritus: Secondary | ICD-10-CM | POA: Diagnosis not present

## 2014-12-02 DIAGNOSIS — I10 Essential (primary) hypertension: Secondary | ICD-10-CM | POA: Diagnosis not present

## 2014-12-02 DIAGNOSIS — G40909 Epilepsy, unspecified, not intractable, without status epilepticus: Secondary | ICD-10-CM | POA: Diagnosis not present

## 2014-12-02 DIAGNOSIS — K921 Melena: Secondary | ICD-10-CM | POA: Diagnosis not present

## 2014-12-03 DIAGNOSIS — I10 Essential (primary) hypertension: Secondary | ICD-10-CM | POA: Diagnosis not present

## 2014-12-03 IMAGING — CT CT ABD-PELV W/ CM
1 of 2 series · 15 of 32 positions shown, 19 images · non-contrast
Comparison: none

REASON FOR EXAM: (1) LLQ PAIN; (2) LLQ PAIN
COMMENTS:

PROCEDURE:     CT  - CT ABDOMEN / PELVIS  W  - December 23, 2012  [DATE]
RESULT:     CT abdomen pelvis dated 12/23/2012 comparison made to prior study
07/08/2012.
TECHNIQUE: Helical 3 mm sections were obtained from lung bases to the
symphysis status post intravenous ministration of 1[REDACTED].

[Series 2: 3mm soft tissue · axial · 0.89mm/px · z∈[-334,+149]mm · 15 of 175 slices shown, 19 images]
[im 7/175  soft-tissue]
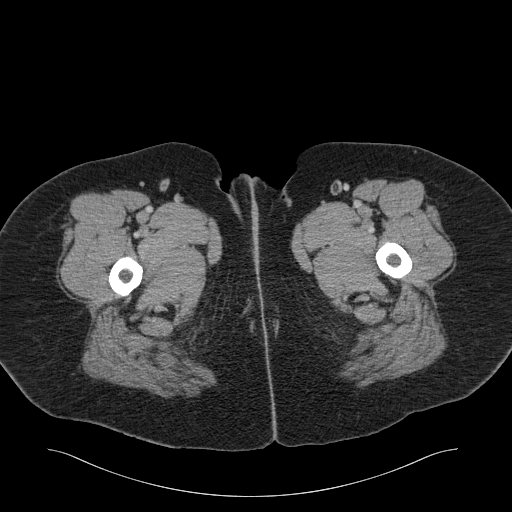
[im 7/175  bone]
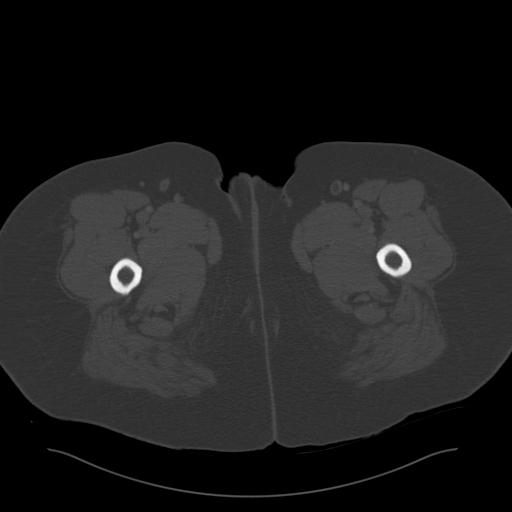
[im 21/175  soft-tissue]
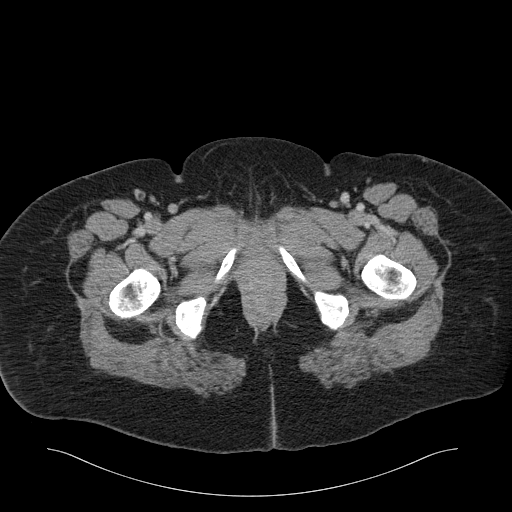
[im 35/175  soft-tissue]
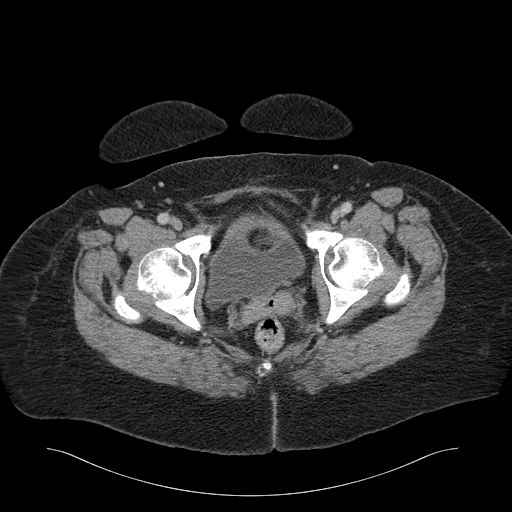
[im 49/175  soft-tissue]
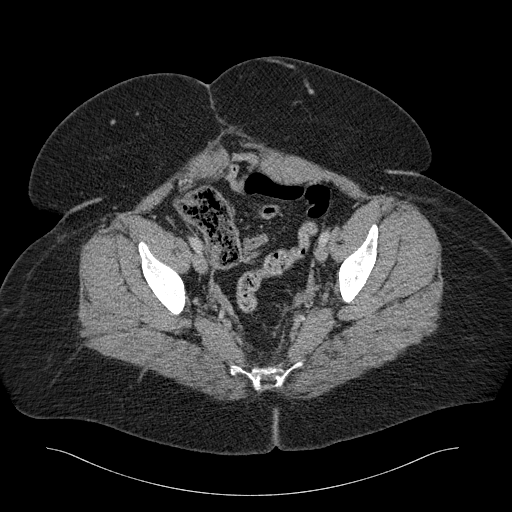
[im 63/175  soft-tissue]
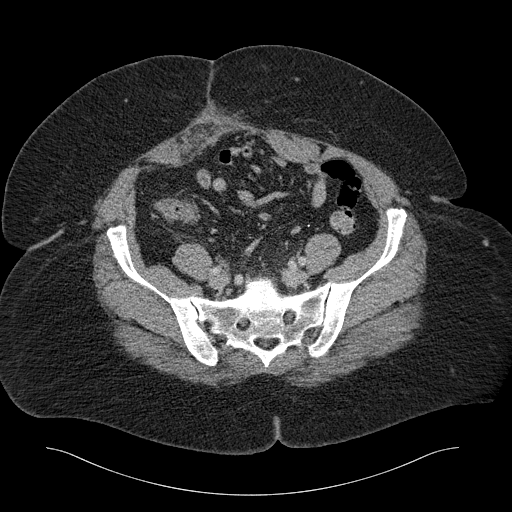
[im 77/175  soft-tissue]
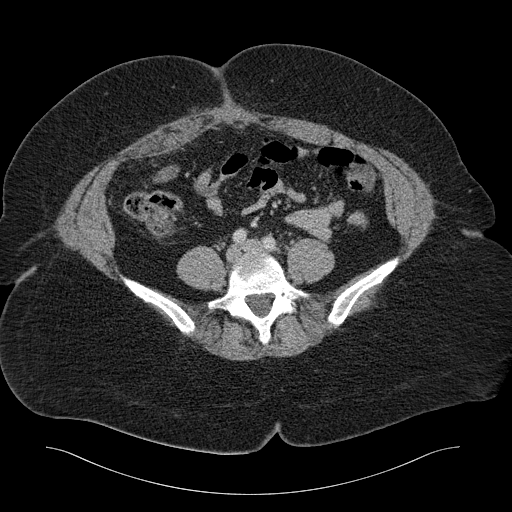
[im 91/175  soft-tissue]
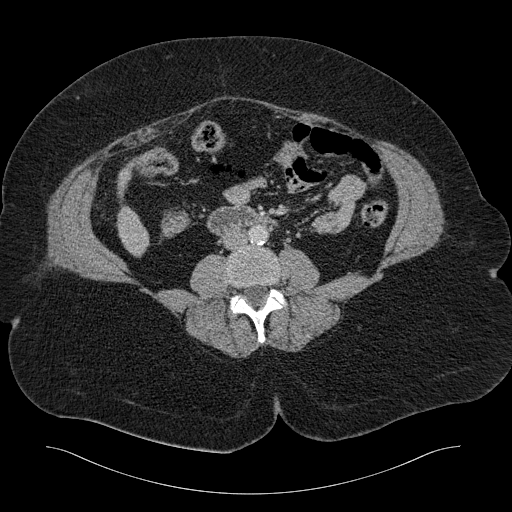
[im 98/175  soft-tissue]
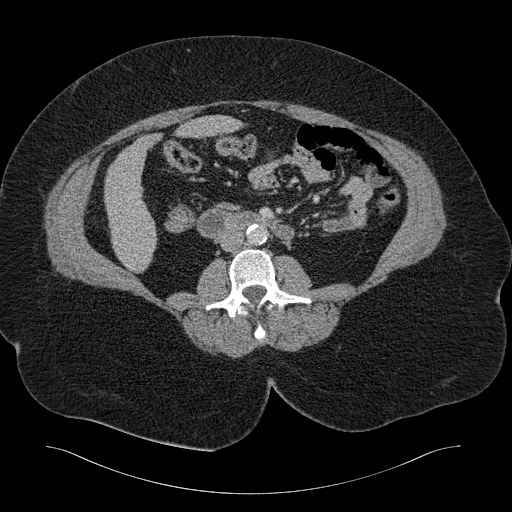
[im 112/175  soft-tissue]
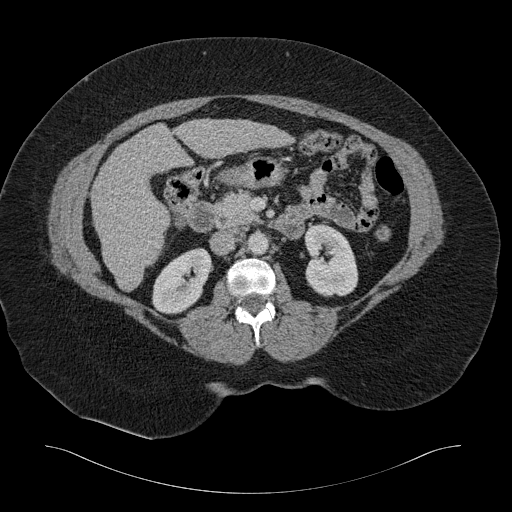
[im 112/175  bone]
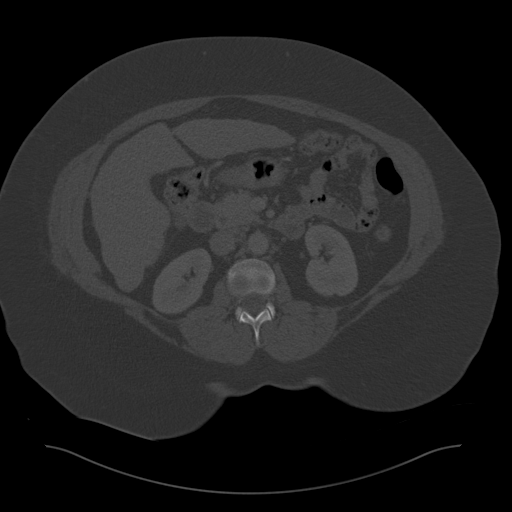
[im 126/175  soft-tissue]
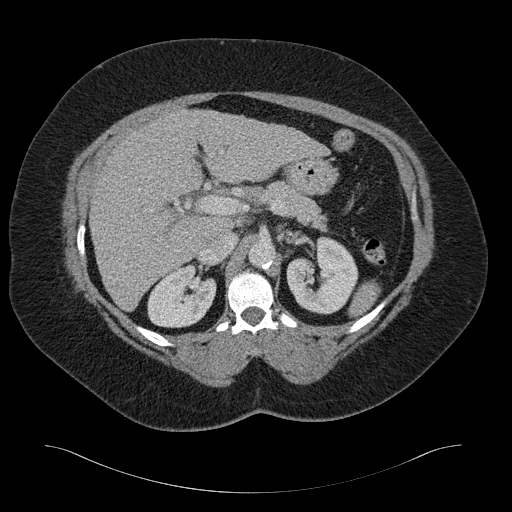
[im 140/175  soft-tissue]
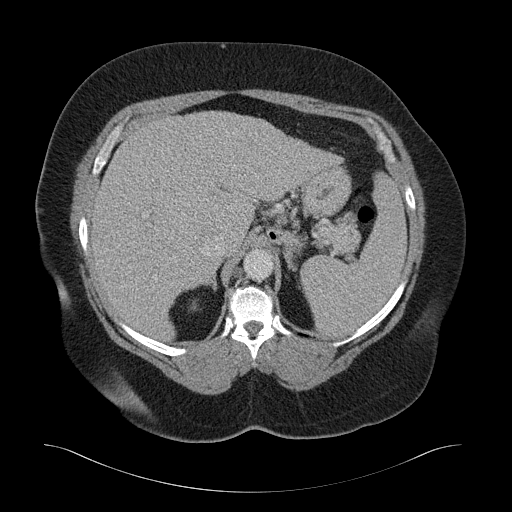
[im 147/175  lung]
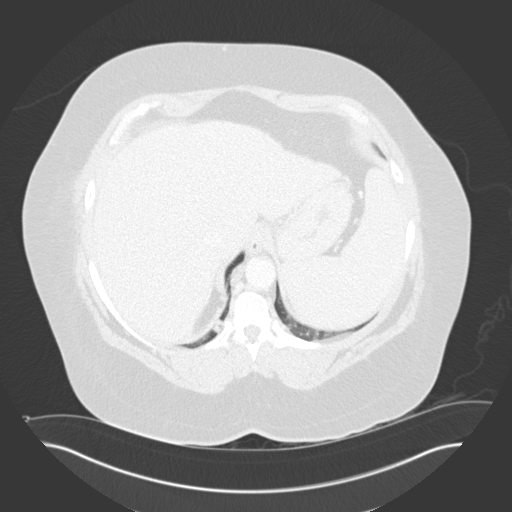
[im 154/175  soft-tissue]
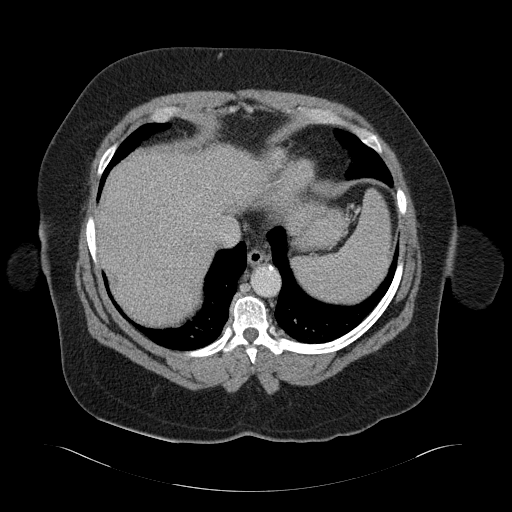
[im 154/175  lung]
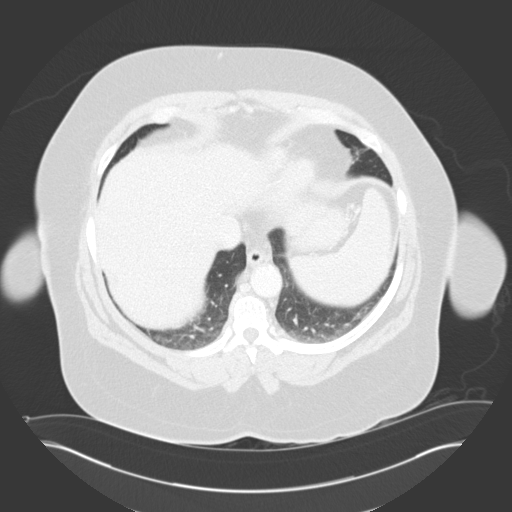
[im 161/175  lung]
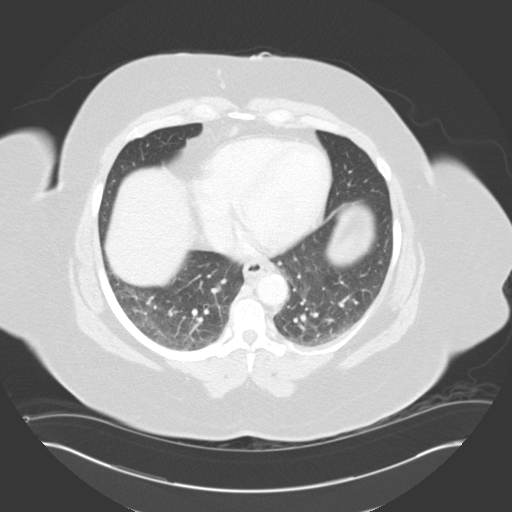
[im 168/175  soft-tissue]
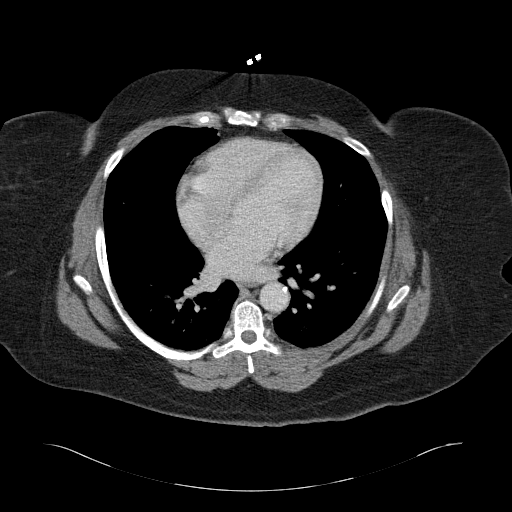
[im 168/175  lung]
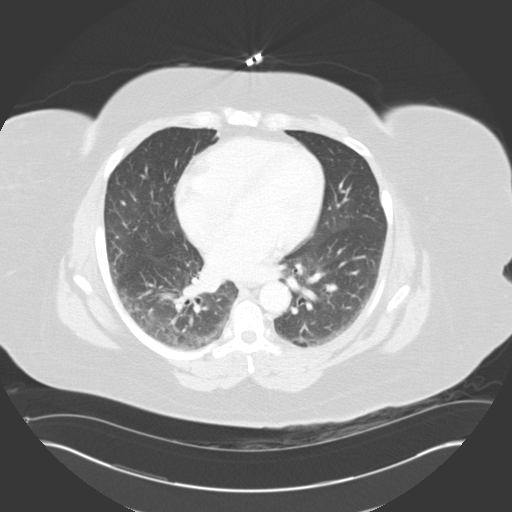

[15 of 32 positions shown; findings below may reference images not displayed]

FINDINGS: Mild areas of increased density project in the base and lingula
lobes the dependent portion of the lower lungs. Findings like represent
dependent atelectasis.

The the liver, spleen, right adrenal, and kidneys are unremarkable. A stable
2 cm nodule is identified involving the left adrenal. Patient status post
cholecystectomy. There is no evidence of bowel obstruction, enteritis,
colitis, nor diverticulitis. Within the left adnexal region a 2.74 cm low
attenuating density is appreciated stable from the prior study. Likely
reflecting an ovarian remnant. The right ovary is not clearly identified.

There is no evidence of abdominal aortic aneurysm.
IMPRESSION: No CT evidence of obstructive or inflammatory
abnormalities.
2. Remainder of findings as described above.

## 2014-12-04 DIAGNOSIS — I10 Essential (primary) hypertension: Secondary | ICD-10-CM | POA: Diagnosis not present

## 2014-12-05 DIAGNOSIS — I10 Essential (primary) hypertension: Secondary | ICD-10-CM | POA: Diagnosis not present

## 2014-12-06 DIAGNOSIS — I10 Essential (primary) hypertension: Secondary | ICD-10-CM | POA: Diagnosis not present

## 2014-12-07 ENCOUNTER — Ambulatory Visit: Payer: Medicare Other

## 2014-12-07 ENCOUNTER — Other Ambulatory Visit: Payer: Self-pay | Admitting: Obstetrics and Gynecology

## 2014-12-07 DIAGNOSIS — Z933 Colostomy status: Secondary | ICD-10-CM | POA: Diagnosis not present

## 2014-12-07 DIAGNOSIS — K7030 Alcoholic cirrhosis of liver without ascites: Secondary | ICD-10-CM | POA: Diagnosis not present

## 2014-12-07 DIAGNOSIS — R093 Abnormal sputum: Secondary | ICD-10-CM | POA: Diagnosis not present

## 2014-12-07 DIAGNOSIS — Z9049 Acquired absence of other specified parts of digestive tract: Secondary | ICD-10-CM | POA: Diagnosis not present

## 2014-12-07 DIAGNOSIS — N189 Chronic kidney disease, unspecified: Secondary | ICD-10-CM | POA: Diagnosis not present

## 2014-12-07 DIAGNOSIS — B1920 Unspecified viral hepatitis C without hepatic coma: Secondary | ICD-10-CM | POA: Diagnosis not present

## 2014-12-07 DIAGNOSIS — Z79899 Other long term (current) drug therapy: Secondary | ICD-10-CM | POA: Diagnosis not present

## 2014-12-07 DIAGNOSIS — Z8673 Personal history of transient ischemic attack (TIA), and cerebral infarction without residual deficits: Secondary | ICD-10-CM | POA: Diagnosis not present

## 2014-12-07 DIAGNOSIS — Z113 Encounter for screening for infections with a predominantly sexual mode of transmission: Secondary | ICD-10-CM | POA: Diagnosis not present

## 2014-12-07 DIAGNOSIS — Z880 Allergy status to penicillin: Secondary | ICD-10-CM | POA: Diagnosis not present

## 2014-12-07 DIAGNOSIS — I10 Essential (primary) hypertension: Secondary | ICD-10-CM | POA: Diagnosis not present

## 2014-12-07 DIAGNOSIS — E119 Type 2 diabetes mellitus without complications: Secondary | ICD-10-CM | POA: Diagnosis not present

## 2014-12-07 DIAGNOSIS — K219 Gastro-esophageal reflux disease without esophagitis: Secondary | ICD-10-CM | POA: Diagnosis not present

## 2014-12-07 DIAGNOSIS — R109 Unspecified abdominal pain: Secondary | ICD-10-CM | POA: Diagnosis not present

## 2014-12-07 DIAGNOSIS — J439 Emphysema, unspecified: Secondary | ICD-10-CM | POA: Diagnosis not present

## 2014-12-07 DIAGNOSIS — R1012 Left upper quadrant pain: Secondary | ICD-10-CM | POA: Diagnosis not present

## 2014-12-07 DIAGNOSIS — J9811 Atelectasis: Secondary | ICD-10-CM | POA: Diagnosis not present

## 2014-12-07 DIAGNOSIS — I129 Hypertensive chronic kidney disease with stage 1 through stage 4 chronic kidney disease, or unspecified chronic kidney disease: Secondary | ICD-10-CM | POA: Diagnosis not present

## 2014-12-07 DIAGNOSIS — Z885 Allergy status to narcotic agent status: Secondary | ICD-10-CM | POA: Diagnosis not present

## 2014-12-07 DIAGNOSIS — R05 Cough: Secondary | ICD-10-CM | POA: Diagnosis not present

## 2014-12-07 DIAGNOSIS — Z881 Allergy status to other antibiotic agents status: Secondary | ICD-10-CM | POA: Diagnosis not present

## 2014-12-07 DIAGNOSIS — Z882 Allergy status to sulfonamides status: Secondary | ICD-10-CM | POA: Diagnosis not present

## 2014-12-07 DIAGNOSIS — J449 Chronic obstructive pulmonary disease, unspecified: Secondary | ICD-10-CM | POA: Diagnosis not present

## 2014-12-07 DIAGNOSIS — R102 Pelvic and perineal pain: Secondary | ICD-10-CM

## 2014-12-07 DIAGNOSIS — E669 Obesity, unspecified: Secondary | ICD-10-CM | POA: Diagnosis not present

## 2014-12-07 NOTE — Addendum Note (Signed)
Addended by: Augusto Gamble on: 12/07/2014 10:00 AM   Modules accepted: Orders

## 2014-12-08 DIAGNOSIS — I10 Essential (primary) hypertension: Secondary | ICD-10-CM | POA: Diagnosis not present

## 2014-12-09 DIAGNOSIS — I10 Essential (primary) hypertension: Secondary | ICD-10-CM | POA: Diagnosis not present

## 2014-12-10 DIAGNOSIS — I10 Essential (primary) hypertension: Secondary | ICD-10-CM | POA: Diagnosis not present

## 2014-12-11 DIAGNOSIS — I10 Essential (primary) hypertension: Secondary | ICD-10-CM | POA: Diagnosis not present

## 2014-12-12 ENCOUNTER — Telehealth: Payer: Self-pay | Admitting: Obstetrics and Gynecology

## 2014-12-12 DIAGNOSIS — I10 Essential (primary) hypertension: Secondary | ICD-10-CM | POA: Diagnosis not present

## 2014-12-13 DIAGNOSIS — I10 Essential (primary) hypertension: Secondary | ICD-10-CM | POA: Diagnosis not present

## 2014-12-14 DIAGNOSIS — J449 Chronic obstructive pulmonary disease, unspecified: Secondary | ICD-10-CM | POA: Diagnosis not present

## 2014-12-14 DIAGNOSIS — K859 Acute pancreatitis, unspecified: Secondary | ICD-10-CM | POA: Diagnosis not present

## 2014-12-14 DIAGNOSIS — Z716 Tobacco abuse counseling: Secondary | ICD-10-CM | POA: Diagnosis not present

## 2014-12-14 DIAGNOSIS — K219 Gastro-esophageal reflux disease without esophagitis: Secondary | ICD-10-CM | POA: Diagnosis not present

## 2014-12-14 DIAGNOSIS — I10 Essential (primary) hypertension: Secondary | ICD-10-CM | POA: Diagnosis not present

## 2014-12-14 DIAGNOSIS — E669 Obesity, unspecified: Secondary | ICD-10-CM | POA: Diagnosis not present

## 2014-12-14 IMAGING — CR DG CHEST 1V PORT
1 series · 1 of 1 positions shown · non-contrast
Comparison: none

REASON FOR EXAM: shortness of breath
COMMENTS:

[ap]
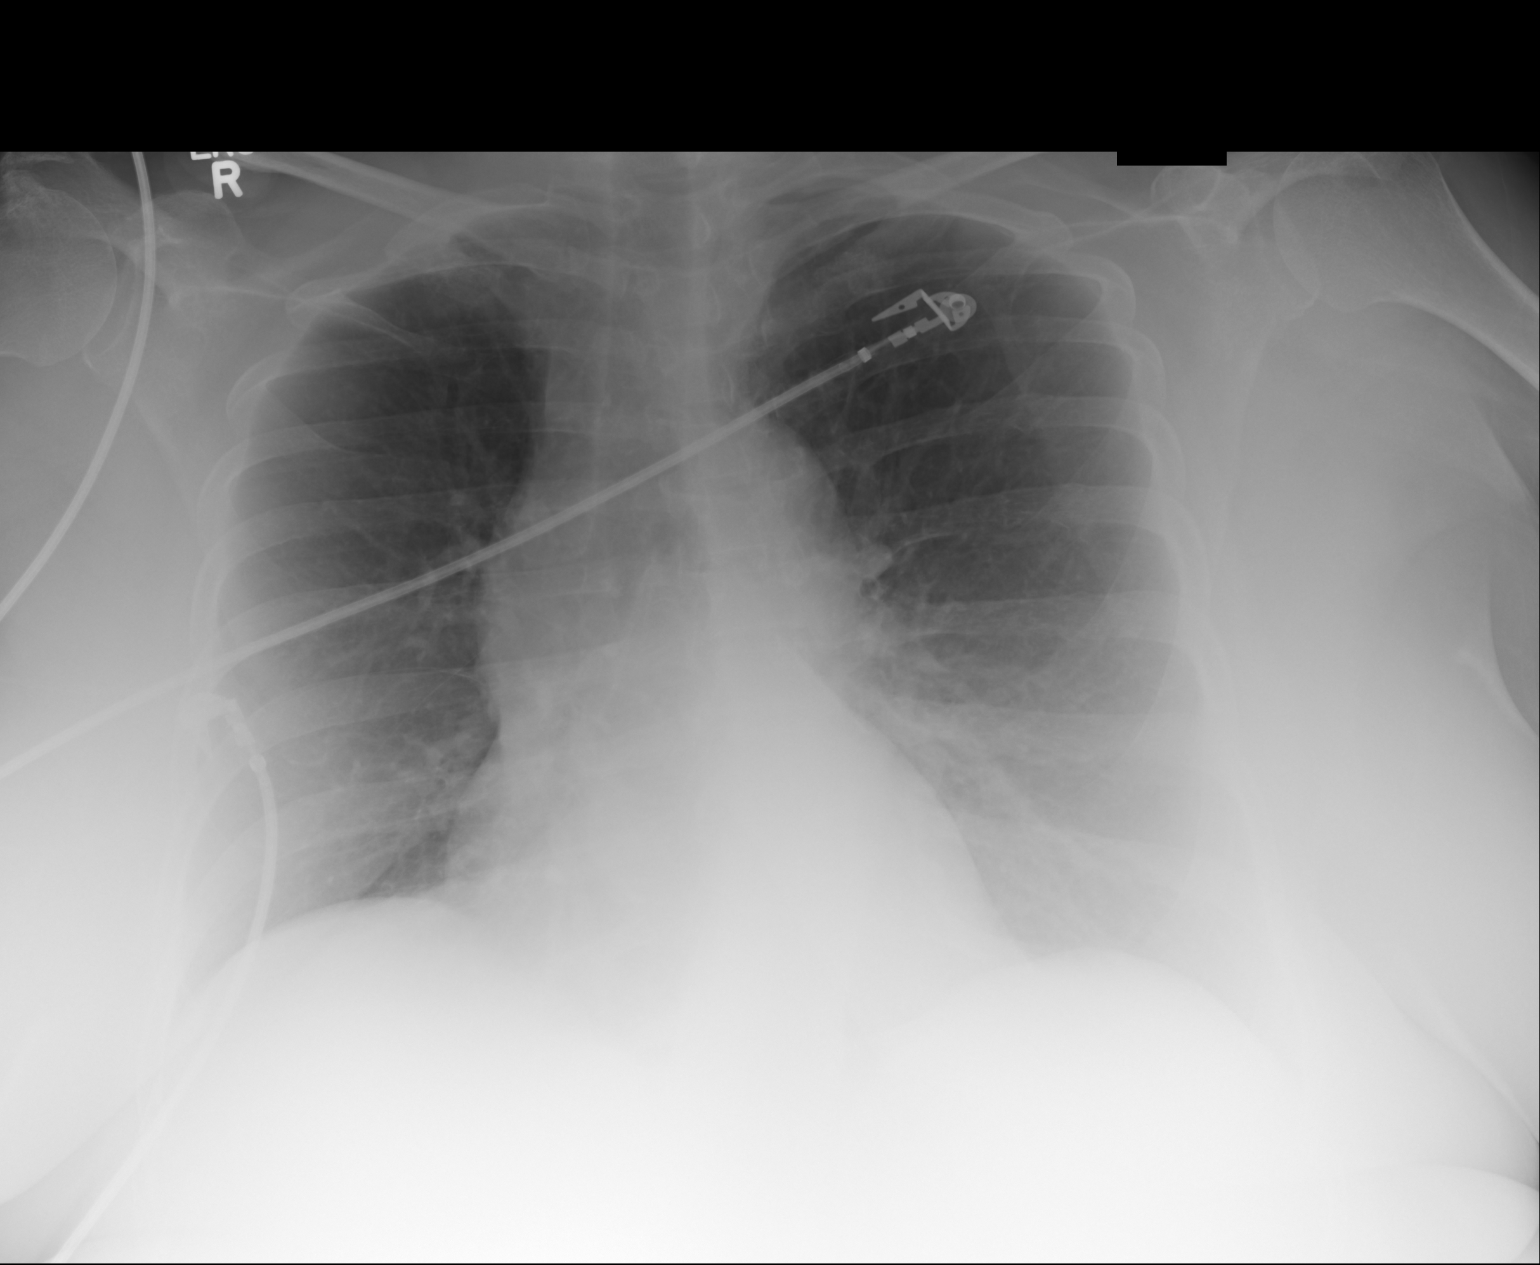

[1 of 1 positions shown; findings below may reference images not displayed]

PROCEDURE:     DXR - DXR PORTABLE CHEST SINGLE VIEW  - January 03, 2013 [DATE]

RESULT:     Comparison is made to the study November 12, 2012.

The lungs are mildly hyperinflated but clear. The cardiac silhouette is
normal in size. The ascending and descending thoracic aorta is normal where
visualized. There are mildly increased lung markings in the left hilar
region. There is no pleural effusion.
IMPRESSION: There is no evidence of pneumonia but I cannot exclude
subsegmental atelectasis in the left perihilar region. There is no overt
evidence of CHF.

[REDACTED]

## 2014-12-15 DIAGNOSIS — N823 Fistula of vagina to large intestine: Secondary | ICD-10-CM | POA: Diagnosis not present

## 2014-12-15 DIAGNOSIS — R162 Hepatomegaly with splenomegaly, not elsewhere classified: Secondary | ICD-10-CM | POA: Diagnosis not present

## 2014-12-15 DIAGNOSIS — R399 Unspecified symptoms and signs involving the genitourinary system: Secondary | ICD-10-CM | POA: Diagnosis not present

## 2014-12-15 DIAGNOSIS — I10 Essential (primary) hypertension: Secondary | ICD-10-CM | POA: Diagnosis not present

## 2014-12-15 NOTE — Telephone Encounter (Signed)
Called pt unable to leave message as no voicemail was available.

## 2014-12-16 DIAGNOSIS — I10 Essential (primary) hypertension: Secondary | ICD-10-CM | POA: Diagnosis not present

## 2014-12-17 DIAGNOSIS — I10 Essential (primary) hypertension: Secondary | ICD-10-CM | POA: Diagnosis not present

## 2014-12-18 DIAGNOSIS — I10 Essential (primary) hypertension: Secondary | ICD-10-CM | POA: Diagnosis not present

## 2014-12-19 DIAGNOSIS — I10 Essential (primary) hypertension: Secondary | ICD-10-CM | POA: Diagnosis not present

## 2014-12-19 DIAGNOSIS — J449 Chronic obstructive pulmonary disease, unspecified: Secondary | ICD-10-CM | POA: Diagnosis not present

## 2014-12-20 DIAGNOSIS — I10 Essential (primary) hypertension: Secondary | ICD-10-CM | POA: Diagnosis not present

## 2014-12-21 DIAGNOSIS — M545 Low back pain: Secondary | ICD-10-CM | POA: Diagnosis not present

## 2014-12-21 DIAGNOSIS — I10 Essential (primary) hypertension: Secondary | ICD-10-CM | POA: Diagnosis not present

## 2014-12-21 DIAGNOSIS — Z79891 Long term (current) use of opiate analgesic: Secondary | ICD-10-CM | POA: Diagnosis not present

## 2014-12-21 DIAGNOSIS — G894 Chronic pain syndrome: Secondary | ICD-10-CM | POA: Diagnosis not present

## 2014-12-21 DIAGNOSIS — Z5181 Encounter for therapeutic drug level monitoring: Secondary | ICD-10-CM | POA: Diagnosis not present

## 2014-12-21 DIAGNOSIS — G893 Neoplasm related pain (acute) (chronic): Secondary | ICD-10-CM | POA: Diagnosis not present

## 2014-12-22 DIAGNOSIS — I10 Essential (primary) hypertension: Secondary | ICD-10-CM | POA: Diagnosis not present

## 2014-12-23 DIAGNOSIS — R162 Hepatomegaly with splenomegaly, not elsewhere classified: Secondary | ICD-10-CM | POA: Diagnosis not present

## 2014-12-23 DIAGNOSIS — K7030 Alcoholic cirrhosis of liver without ascites: Secondary | ICD-10-CM | POA: Insufficient documentation

## 2014-12-23 DIAGNOSIS — I10 Essential (primary) hypertension: Secondary | ICD-10-CM | POA: Diagnosis not present

## 2014-12-23 DIAGNOSIS — D320 Benign neoplasm of cerebral meninges: Secondary | ICD-10-CM | POA: Diagnosis not present

## 2014-12-23 DIAGNOSIS — F32A Depression, unspecified: Secondary | ICD-10-CM | POA: Insufficient documentation

## 2014-12-23 DIAGNOSIS — E119 Type 2 diabetes mellitus without complications: Secondary | ICD-10-CM | POA: Diagnosis not present

## 2014-12-23 DIAGNOSIS — F329 Major depressive disorder, single episode, unspecified: Secondary | ICD-10-CM | POA: Insufficient documentation

## 2014-12-23 DIAGNOSIS — Z1231 Encounter for screening mammogram for malignant neoplasm of breast: Secondary | ICD-10-CM | POA: Diagnosis not present

## 2014-12-23 DIAGNOSIS — K860 Alcohol-induced chronic pancreatitis: Secondary | ICD-10-CM | POA: Diagnosis not present

## 2014-12-23 DIAGNOSIS — F419 Anxiety disorder, unspecified: Secondary | ICD-10-CM | POA: Insufficient documentation

## 2014-12-23 DIAGNOSIS — Z794 Long term (current) use of insulin: Secondary | ICD-10-CM | POA: Diagnosis not present

## 2014-12-23 DIAGNOSIS — Z23 Encounter for immunization: Secondary | ICD-10-CM | POA: Diagnosis not present

## 2014-12-24 DIAGNOSIS — I10 Essential (primary) hypertension: Secondary | ICD-10-CM | POA: Diagnosis not present

## 2014-12-25 DIAGNOSIS — I10 Essential (primary) hypertension: Secondary | ICD-10-CM | POA: Diagnosis not present

## 2014-12-27 ENCOUNTER — Emergency Department
Admission: EM | Admit: 2014-12-27 | Discharge: 2014-12-27 | Disposition: A | Discharge status: Home / Self Care | Payer: Medicare Other | Attending: Emergency Medicine | Admitting: Emergency Medicine

## 2014-12-27 ENCOUNTER — Emergency Department: Payer: Medicare Other

## 2014-12-27 ENCOUNTER — Encounter: Payer: Self-pay | Admitting: Emergency Medicine

## 2014-12-27 DIAGNOSIS — N189 Chronic kidney disease, unspecified: Secondary | ICD-10-CM | POA: Insufficient documentation

## 2014-12-27 DIAGNOSIS — R509 Fever, unspecified: Secondary | ICD-10-CM | POA: Insufficient documentation

## 2014-12-27 DIAGNOSIS — Z79899 Other long term (current) drug therapy: Secondary | ICD-10-CM | POA: Diagnosis not present

## 2014-12-27 DIAGNOSIS — K920 Hematemesis: Secondary | ICD-10-CM | POA: Diagnosis not present

## 2014-12-27 DIAGNOSIS — R1110 Vomiting, unspecified: Secondary | ICD-10-CM | POA: Insufficient documentation

## 2014-12-27 DIAGNOSIS — Z880 Allergy status to penicillin: Secondary | ICD-10-CM | POA: Insufficient documentation

## 2014-12-27 DIAGNOSIS — Z720 Tobacco use: Secondary | ICD-10-CM | POA: Insufficient documentation

## 2014-12-27 DIAGNOSIS — R531 Weakness: Secondary | ICD-10-CM | POA: Diagnosis not present

## 2014-12-27 DIAGNOSIS — R197 Diarrhea, unspecified: Secondary | ICD-10-CM | POA: Insufficient documentation

## 2014-12-27 DIAGNOSIS — R109 Unspecified abdominal pain: Secondary | ICD-10-CM | POA: Insufficient documentation

## 2014-12-27 DIAGNOSIS — I129 Hypertensive chronic kidney disease with stage 1 through stage 4 chronic kidney disease, or unspecified chronic kidney disease: Secondary | ICD-10-CM | POA: Diagnosis not present

## 2014-12-27 DIAGNOSIS — R079 Chest pain, unspecified: Secondary | ICD-10-CM | POA: Diagnosis not present

## 2014-12-27 LAB — COMPREHENSIVE METABOLIC PANEL
ALK PHOS: 102 U/L (ref 38–126)
ALT: 22 U/L (ref 14–54)
AST: 54 U/L — AB (ref 15–41)
Albumin: 3.7 g/dL (ref 3.5–5.0)
Anion gap: 10 (ref 5–15)
BILIRUBIN TOTAL: 1.5 mg/dL — AB (ref 0.3–1.2)
BUN: 8 mg/dL (ref 6–20)
CHLORIDE: 105 mmol/L (ref 101–111)
CO2: 23 mmol/L (ref 22–32)
CREATININE: 0.89 mg/dL (ref 0.44–1.00)
Calcium: 9.2 mg/dL (ref 8.9–10.3)
GFR calc Af Amer: >60 mL/min (ref >60)
GFR calc non Af Amer: >60 mL/min (ref >60)
Glucose, Bld: 85 mg/dL (ref 65–99)
POTASSIUM: 4.7 mmol/L (ref 3.5–5.1)
Sodium: 138 mmol/L (ref 135–145)
Total Protein: 8.5 g/dL — ABNORMAL HIGH (ref 6.5–8.1)

## 2014-12-27 LAB — CBC WITH DIFFERENTIAL/PLATELET
BASOS PCT: 1 %
Basophils Absolute: 0 10*3/uL (ref 0–0.1)
Eosinophils Absolute: 0.1 10*3/uL (ref 0–0.7)
Eosinophils Relative: 3 %
HEMATOCRIT: 35.9 % (ref 35.0–47.0)
Hemoglobin: 11.1 g/dL — ABNORMAL LOW (ref 12.0–16.0)
Lymphocytes Relative: 39 %
Lymphs Abs: 2.3 10*3/uL (ref 1.0–3.6)
MCH: 24.5 pg — AB (ref 26.0–34.0)
MCHC: 31 g/dL — AB (ref 32.0–36.0)
MCV: 79.1 fL — AB (ref 80.0–100.0)
MONO ABS: 0.6 10*3/uL (ref 0.2–0.9)
MONOS PCT: 10 %
Neutro Abs: 2.9 10*3/uL (ref 1.4–6.5)
Neutrophils Relative %: 49 %
Platelets: 156 10*3/uL (ref 150–440)
RBC: 4.54 MIL/uL (ref 3.80–5.20)
RDW: 18.6 % — ABNORMAL HIGH (ref 11.5–14.5)
WBC: 5.9 10*3/uL (ref 3.6–11.0)

## 2014-12-27 LAB — URINALYSIS COMPLETE WITH MICROSCOPIC (ARMC ONLY)
Bilirubin Urine: NEGATIVE
GLUCOSE, UA: NEGATIVE mg/dL
Hgb urine dipstick: NEGATIVE
Ketones, ur: NEGATIVE mg/dL
Nitrite: NEGATIVE
Protein, ur: NEGATIVE mg/dL
SPECIFIC GRAVITY, URINE: 1.017 (ref 1.005–1.030)
pH: 5 (ref 5.0–8.0)

## 2014-12-27 LAB — LIPASE, BLOOD: LIPASE: 43 U/L (ref 22–51)

## 2014-12-27 MED ORDER — IPRATROPIUM-ALBUTEROL 0.5-2.5 (3) MG/3ML IN SOLN
RESPIRATORY_TRACT | Status: AC
  Administered 2014-12-27: 3 mL via RESPIRATORY_TRACT
  Filled 2014-12-27: qty 3

## 2014-12-27 MED ORDER — PROMETHAZINE HCL 25 MG/ML IJ SOLN
25.0000 mg | Freq: Once | INTRAMUSCULAR | Status: AC
  Administered 2014-12-27: 25 mg via INTRAVENOUS

## 2014-12-27 MED ORDER — PROMETHAZINE HCL 25 MG/ML IJ SOLN
INTRAMUSCULAR | Status: AC
  Administered 2014-12-27: 25 mg via INTRAVENOUS
  Filled 2014-12-27: qty 1

## 2014-12-27 MED ORDER — IPRATROPIUM-ALBUTEROL 0.5-2.5 (3) MG/3ML IN SOLN
3.0000 mL | Freq: Once | RESPIRATORY_TRACT | Status: AC
  Administered 2014-12-27: 3 mL via RESPIRATORY_TRACT

## 2014-12-27 NOTE — ED Notes (Signed)
MD at bedside. 

## 2014-12-27 NOTE — ED Notes (Signed)
Met C and lipase to be redrawn per lab.

## 2014-12-27 NOTE — Discharge Instructions (Signed)
Please seek medical attention for any high fevers, chest pain, shortness of breath, change in behavior, persistent vomiting, bloody stool or any other new or concerning symptoms.  Abdominal Pain Many things can cause abdominal pain. Usually, abdominal pain is not caused by a disease and will improve without treatment. It can often be observed and treated at home. Your health care provider will do a physical exam and possibly order blood tests and X-rays to help determine the seriousness of your pain. However, in many cases, more time must pass before a clear cause of the pain can be found. Before that point, your health care provider may not know if you need more testing or further treatment. HOME CARE INSTRUCTIONS  Monitor your abdominal pain for any changes. The following actions may help to alleviate any discomfort you are experiencing:  Only take over-the-counter or prescription medicines as directed by your health care provider.  Do not take laxatives unless directed to do so by your health care provider.  Try a clear liquid diet (broth, tea, or water) as directed by your health care provider. Slowly move to a bland diet as tolerated. SEEK MEDICAL CARE IF:  You have unexplained abdominal pain.  You have abdominal pain associated with nausea or diarrhea.  You have pain when you urinate or have a bowel movement.  You experience abdominal pain that wakes you in the night.  You have abdominal pain that is worsened or improved by eating food.  You have abdominal pain that is worsened with eating fatty foods.  You have a fever. SEEK IMMEDIATE MEDICAL CARE IF:   Your pain does not go away within 2 hours.  You keep throwing up (vomiting).  Your pain is felt only in portions of the abdomen, such as the right side or the left lower portion of the abdomen.  You pass bloody or black tarry stools. MAKE SURE YOU:  Understand these instructions.   Will watch your condition.   Will  get help right away if you are not doing well or get worse.  Document Released: 04/03/2005 Document Revised: 06/29/2013 Document Reviewed: 03/03/2013 [facility] Patient Information 2015 [place], [place]. This information is not intended to replace advice given to you by your health care provider. Make sure you discuss any questions you have with your health care provider.

## 2014-12-27 NOTE — ED Notes (Signed)
Brought in via ems with abd pain nausea/vomiting . She also noticed some blood in stools

## 2014-12-27 NOTE — ED Notes (Signed)
Brought in via ems with poss gi bleed.

## 2014-12-27 NOTE — ED Provider Notes (Signed)
[facility] Emergency Department Provider Note   ____________________________________________  Time seen: 1610  I have reviewed the triage vital signs and the nursing notes.   HISTORY  Chief Complaint Abdominal Pain   History limited by: Not Limited   HPI [name] is a 59 y.o. female presents to the emergency department today with multiple medical complaints including not feeling well, left-sided abdominal pain, vomiting, bloody stool, rectovaginal fistula increase output, wheezing, cough, shortness of breath and fever. It is somewhat unclear how long all these symptoms have been going on but it seems that they have been going on for at least a few days. Furthermore the patient states that she has passed out today. It appears that the passing out and pain over her 2 main complaints however is somewhat hard to decipher.     Past Medical History  Diagnosis Date  . Emphysema of lung   . Depression   . Diverticulitis   . Chronic bronchitis   . Malignant brain tumor 2007  . GERD (gastroesophageal reflux disease)   . Allergy   . Hepatitis C   . Hypertension   . Chronic kidney disease   . Migraines   . Urine incontinence   . Seizures   . Stroke 2007    during brain surgery  . Pancreatitis, alcoholic 9702  . Rectovaginal fistula   . H/O ETOH abuse     Sober since 2009  . H/O drug abuse     Clean since 2009  . Pancreatic ascites   . Cirrhosis     Patient Active Problem List   Diagnosis Date Noted  . HTN (hypertension) 11/05/2014  . Liver cirrhosis 07/19/2014  . 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 Chronic pancreatitis 11/03/2012  . Seizure disorder 11/03/2012  . Benign meningioma of brain 11/03/2012  . Screening for breast cancer 11/03/2012  . Chronic pelvic pain in female 11/03/2012  . Hepatitis C virus infection without hepatic coma 11/03/2012  . UTI (urinary tract infection) 11/03/2012  . Alcohol abuse, unspecified 11/03/2012   . Chronic liver disease 11/03/2012  . Schizophrenia 11/03/2012  . Narcotic abuse 11/03/2012  . Benign neoplasm of cerebral meninges 11/03/2012  . Breast screening 11/03/2012  . 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 Nondependent alcohol abuse 11/03/2012  . 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 Female genital symptoms 11/03/2012  . Infection of urinary tract 11/03/2012    Past Surgical History  Procedure Laterality Date  . Appendectomy  1999  . Eye surgery    . Rectovaginal fistula repair w/ colostomy  2011  . Brain surgery  2007    malignant brain tumor  . Cholecystectomy  2001  . Abdominal hysterectomy  1979    Current Outpatient Rx  Name  Route  Sig  Dispense  Refill  . albuterol (PROAIR HFA) 108 (90 BASE) MCG/ACT inhaler   Inhalation   Inhale 2 puffs into the lungs every 6 (six) hours as needed for wheezing or shortness of breath.         Marland Kitchen albuterol (PROVENTIL) (5 MG/ML) 0.5% nebulizer solution   Nebulization   Take 2.5 mg by nebulization 4 (four) times daily.         Marland Kitchen ALPRAZolam (XANAX) 1 MG tablet   Oral   Take 1 tablet (1 mg total) by mouth daily.   30 tablet   0   . amitriptyline (ELAVIL) 50 MG tablet   Oral   Take 1 tablet (50 mg  total) by mouth at bedtime.   30 tablet   3   . amLODipine (NORVASC) 10 MG tablet   Oral   Take 1 tablet (10 mg total) by mouth daily.   30 tablet   4   . cefdinir (OMNICEF) 300 MG capsule   Oral   Take 300 mg by mouth 2 (two) times daily.         . citalopram (CELEXA) 20 MG tablet   Oral   Take 20 mg by mouth every morning.         Marland Kitchen CREON 12000 UNITS CPEP   Oral   Take 2 capsules by mouth 3 (three) times daily before meals.          . docusate sodium (STOOL SOFTENER) 100 MG capsule   Oral   Take 100 mg by mouth.         . escitalopram (LEXAPRO) 10 MG tablet   Oral   Take 1 tablet (10 mg total) by mouth daily.   30 tablet   4   . fesoterodine  (TOVIAZ) 8 MG TB24 tablet   Oral   Take 8 mg by mouth.         . Fluticasone-Salmeterol (ADVAIR) 250-50 MCG/DOSE AEPB   Inhalation   Inhale 1 puff into the lungs daily as needed (shortness of breath).          . furosemide (LASIX) 20 MG tablet   Oral   Take 20 mg by mouth daily.         Marland Kitchen gabapentin (NEURONTIN) 300 MG capsule   Oral   Take 300 mg by mouth 2 (two) times daily as needed (pain).         . hydrOXYzine (ATARAX/VISTARIL) 25 MG tablet   Oral   Take 25 mg by mouth.         . lactulose (CHRONULAC) 10 GM/15ML solution   Oral   Take 20 g by mouth daily as needed for mild constipation.          . Ledipasvir-Sofosbuvir (HARVONI) 90-400 MG TABS   Oral   Take 1 tablet by mouth daily.   28 tablet   2   . levETIRAcetam (KEPPRA) 500 MG tablet   Oral   Take 1 tablet (500 mg total) by mouth 2 (two) times daily.   60 tablet   3   . metFORMIN (GLUCOPHAGE) 1000 MG tablet   Oral   Take 1,000 mg by mouth 2 (two) times daily.         . metoprolol succinate (TOPROL-XL) 25 MG 24 hr tablet   Oral   Take 1 tablet (25 mg total) by mouth daily.   30 tablet   4   . Misc Natural Products (HORNY GOAT WEED) CAPS   Oral   Take 1 capsule by mouth.         Marland Kitchen omeprazole (PRILOSEC) 20 MG capsule   Oral   Take 1 capsule (20 mg total) by mouth daily.   30 capsule   11     Please stop all of the other PPIs if on profile   . oxyCODONE (ROXICODONE) 15 MG immediate release tablet   Oral   Take 15 mg by mouth every 6 (six) hours as needed for pain.         . promethazine (PHENERGAN) 25 MG tablet   Oral   Take 25 mg by mouth every 6 (six) hours as needed for nausea or vomiting.          Marland Kitchen  QUEtiapine (SEROQUEL) 100 MG tablet   Oral   Take 3.5 tablets (350 mg total) by mouth daily.   105 tablet   4   . ranitidine (ZANTAC) 150 MG capsule   Oral   Take 150 mg by mouth once.         . solifenacin (VESICARE) 10 MG tablet   Oral   Take 10 mg by mouth  daily.         . temazepam (RESTORIL) 30 MG capsule   Oral   Take 30 mg by mouth at bedtime.         Marland Kitchen terconazole (TERAZOL 7) 0.4 % vaginal cream   Vaginal   Place 1 applicator vaginally at bedtime.         Marland Kitchen tiotropium (SPIRIVA) 18 MCG inhalation capsule   Inhalation   Place 18 mcg into inhaler and inhale 2 (two) times daily as needed (shortness of breath).          . traMADol (ULTRAM) 50 MG tablet   Oral   Take 50 mg by mouth every 6 (six) hours as needed.         . triamcinolone (NASACORT ALLERGY 24HR) 55 MCG/ACT AERO nasal inhaler   Nasal   Place 2 sprays into the nose daily as needed (allergies).         Marland Kitchen Umeclidinium-Vilanterol 62.5-25 MCG/INH AEPB   Inhalation   Inhale 1 puff into the lungs daily as needed (for shortness of breath).         . zolpidem (AMBIEN) 10 MG tablet   Oral   Take 1 tablet (10 mg total) by mouth at bedtime.   30 tablet   3     Allergies Dilaudid; Hydromorphone; Ibuprofen; Sulfa antibiotics; Zofran; Zofran; Amoxicillin; Chocolate; Fentanyl; Penicillins; and Strawberry  Family History  Problem Relation Age of Onset  . Hypertension Mother   . Heart disease Father   . Diabetes Father   . Cancer Sister     brain  . Cancer Grandchild 8    brain tumor    Social History History  Substance Use Topics  . Smoking status: Current Some Day Smoker -- 0.25 packs/day    Types: Cigarettes  . Smokeless tobacco: Never Used     Comment: cutting back  . Alcohol Use: No     Comment: no alcohol since 2013    Review of Systems  Constitutional: Positive for fever. Cardiovascular: Negative for chest pain. Respiratory: Positive for shortness of breath. Gastrointestinal: Positive for abdominal pain, vomiting and diarrhea. Genitourinary: Negative for dysuria. Musculoskeletal: Negative for back pain. Skin: Negative for rash. Neurological: Negative for headaches, focal weakness or numbness.  10-point ROS otherwise  negative.  ____________________________________________   PHYSICAL EXAM:  VITAL SIGNS: ED Triage Vitals  Enc Vitals Group     BP 12/27/14 1419 100/74 mmHg     Pulse Rate 12/27/14 1419 100     Resp 12/27/14 1419 20     Temp 12/27/14 1419 98.4 F (36.9 C)     Temp Source 12/27/14 1419 Oral     SpO2 12/27/14 1419 97 %     Weight 12/27/14 1419 275 lb (124.739 kg)     Height 12/27/14 1419 5\' 8"  (1.727 m)     Head Cir --      Peak Flow --      Pain Score 12/27/14 1420 8   Constitutional: Alert and oriented. Well appearing and in no distress. Occasional dry cough Eyes: Conjunctivae are normal.  PERRL. Normal extraocular movements. ENT   Head: Normocephalic and atraumatic.   Nose: No congestion/rhinnorhea.   Mouth/Throat: Mucous membranes are moist.   Neck: No stridor. Hematological/Lymphatic/Immunilogical: No cervical lymphadenopathy. Cardiovascular: Normal rate, regular rhythm.  No murmurs, rubs, or gallops. Respiratory: Normal respiratory effort without tachypnea nor retractions. Breath sounds are clear and equal bilaterally. No wheezes/rales/rhonchi. Gastrointestinal: Soft and nontender. No distention. There is no CVA tenderness. Rectal: No blood on glove, brown stool. GUIAC negative. Genitourinary: Deferred Musculoskeletal: Normal range of motion in all extremities. No joint effusions.  No lower extremity tenderness nor edema. Neurologic:  Normal speech and language. No gross focal neurologic deficits are appreciated. Speech is normal.  Skin:  Skin is warm, dry and intact. No rash noted. Psychiatric: Mood and affect are normal. Speech and behavior are normal. Patient exhibits appropriate insight and judgment.  ____________________________________________    LABS (pertinent positives/negatives)  Labs Reviewed  CBC WITH DIFFERENTIAL/PLATELET - Abnormal; Notable for the following:    Hemoglobin 11.1 (*)    MCV 79.1 (*)    MCH 24.5 (*)    MCHC 31.0 (*)    RDW  18.6 (*)    All other components within normal limits  URINALYSIS COMPLETEWITH MICROSCOPIC ([facility] ONLY) - Abnormal; Notable for the following:    Color, Urine AMBER (*)    APPearance HAZY (*)    Leukocytes, UA 1+ (*)    Bacteria, UA RARE (*)    Squamous Epithelial / LPF 6-30 (*)    All other components within normal limits  COMPREHENSIVE METABOLIC PANEL - Abnormal; Notable for the following:    Total Protein 8.5 (*)    AST 54 (*)    Total Bilirubin 1.5 (*)    All other components within normal limits  LIPASE, BLOOD     ____________________________________________   EKG  None  ____________________________________________    RADIOLOGY  Chest x-ray IMPRESSION: No acute cardiopulmonary findings. ____________________________________________   PROCEDURES  Procedure(s) performed: None  Critical Care performed: No  ____________________________________________   INITIAL IMPRESSION / ASSESSMENT AND PLAN / ED COURSE  Pertinent labs & imaging results that were available during my care of the patient were reviewed by me and considered in my medical decision making (see chart for details).  Patient presents with multiple medical complaints. It is unclear the acuity of any individual complaint. Looking back at the chart patient has had similar complaints in the past. Physical exam is benign, no significant abdominal tenderness, no wheezing, no blood in stool. Will check basic labs, chest x-ray give DuoNeb treatment and reassess.  Patient had lab work without any truly concerning findings. Urine did have some white blood cells of her multiple squamous epithelial cells. Will send a urine culture. At this point given patient's lack of dysuria, fever will not treat. Discussed findings with patient and encouraged primary care follow-up.  ____________________________________________   FINAL CLINICAL IMPRESSION(S) / ED DIAGNOSES  Final diagnoses:  Abdominal pain, unspecified  abdominal location     Nance Pear, MD 12/27/14 [id number]

## 2015-01-03 ENCOUNTER — Ambulatory Visit: Payer: Self-pay | Admitting: Obstetrics and Gynecology

## 2015-01-05 IMAGING — CR DG CHEST 2V
1 series · 2 of 2 positions shown · non-contrast
Comparison: none

REASON FOR EXAM: cp
COMMENTS:

[Series 1: w chest pa · 0.14mm/px · 2 of 2 slices shown]
[im 1/2]
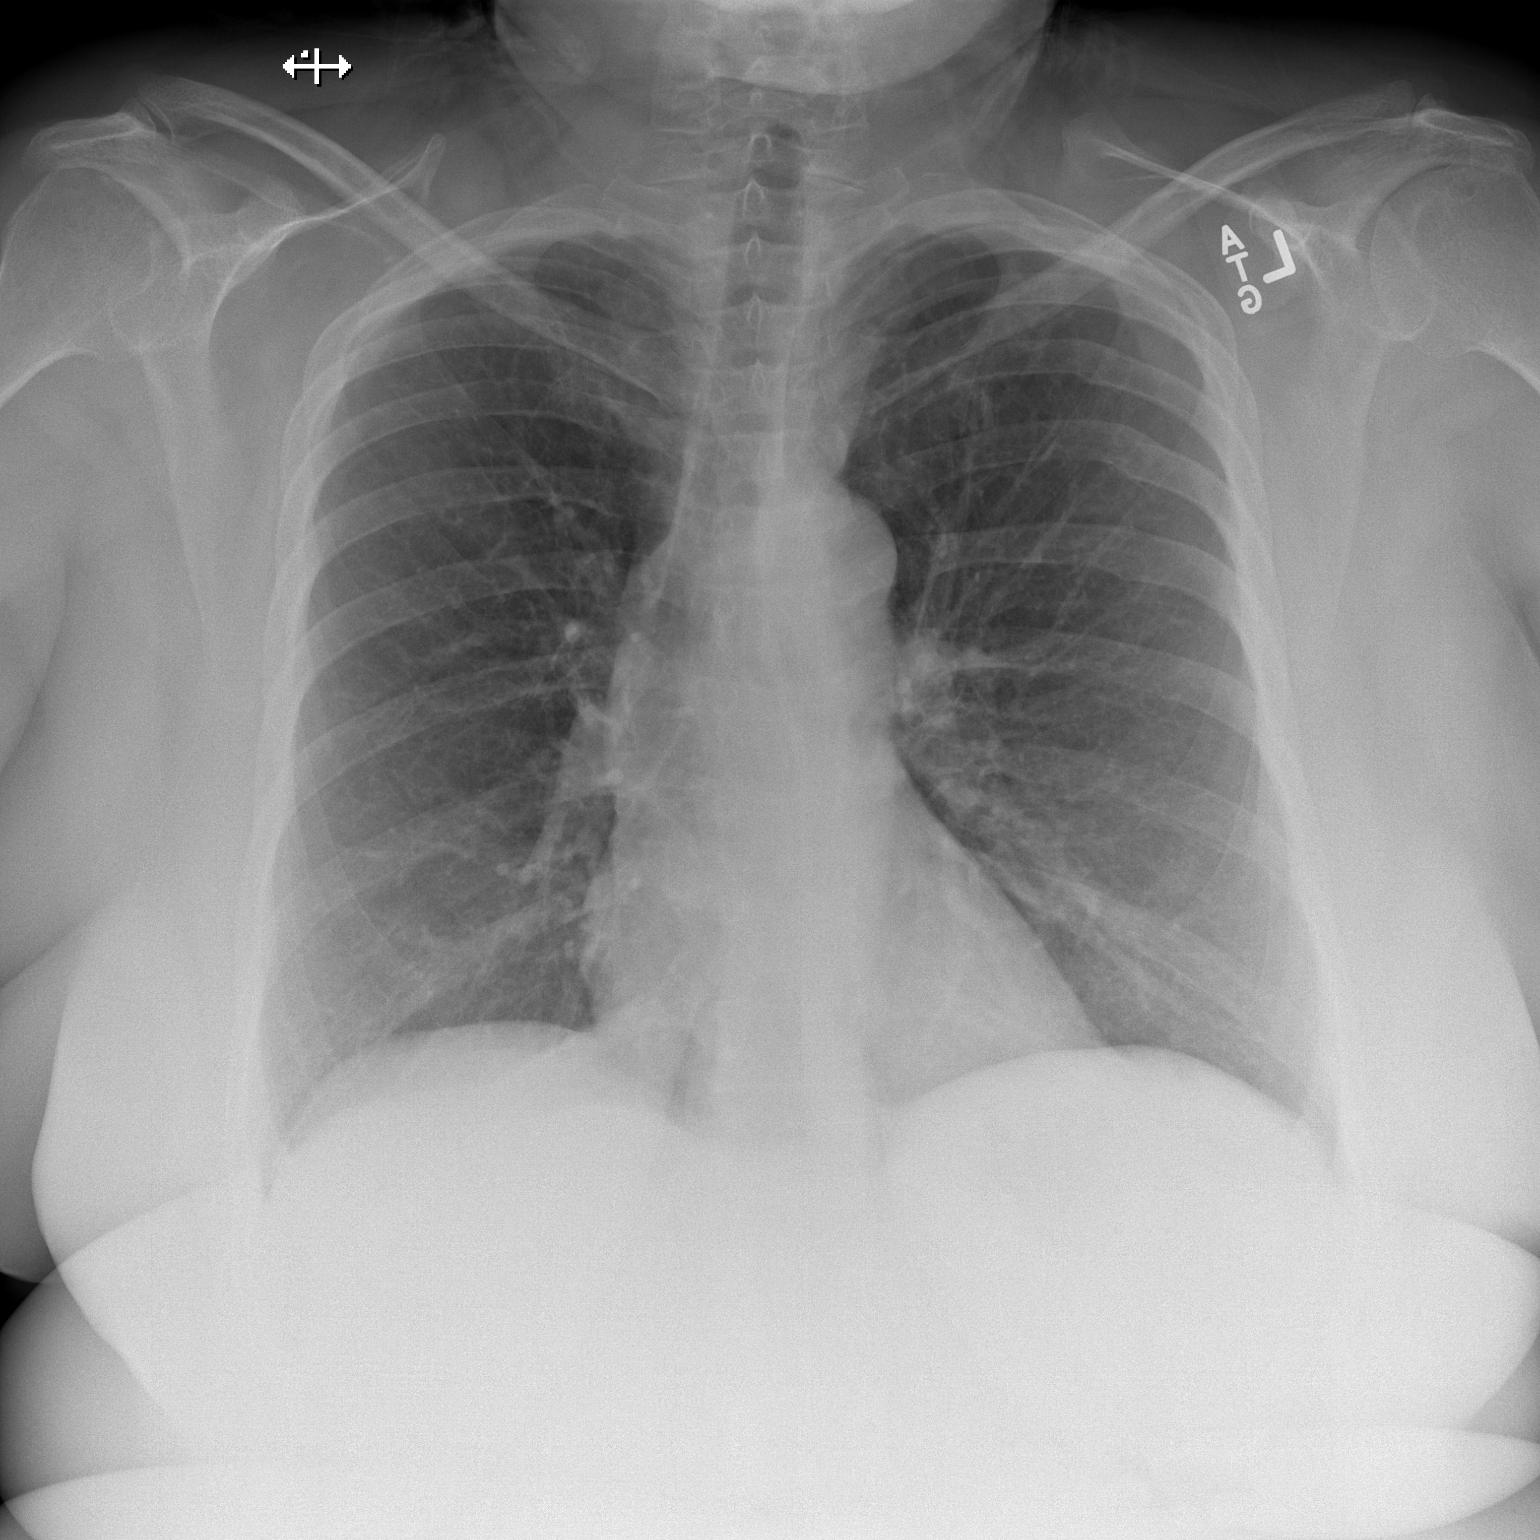
[im 2/2]
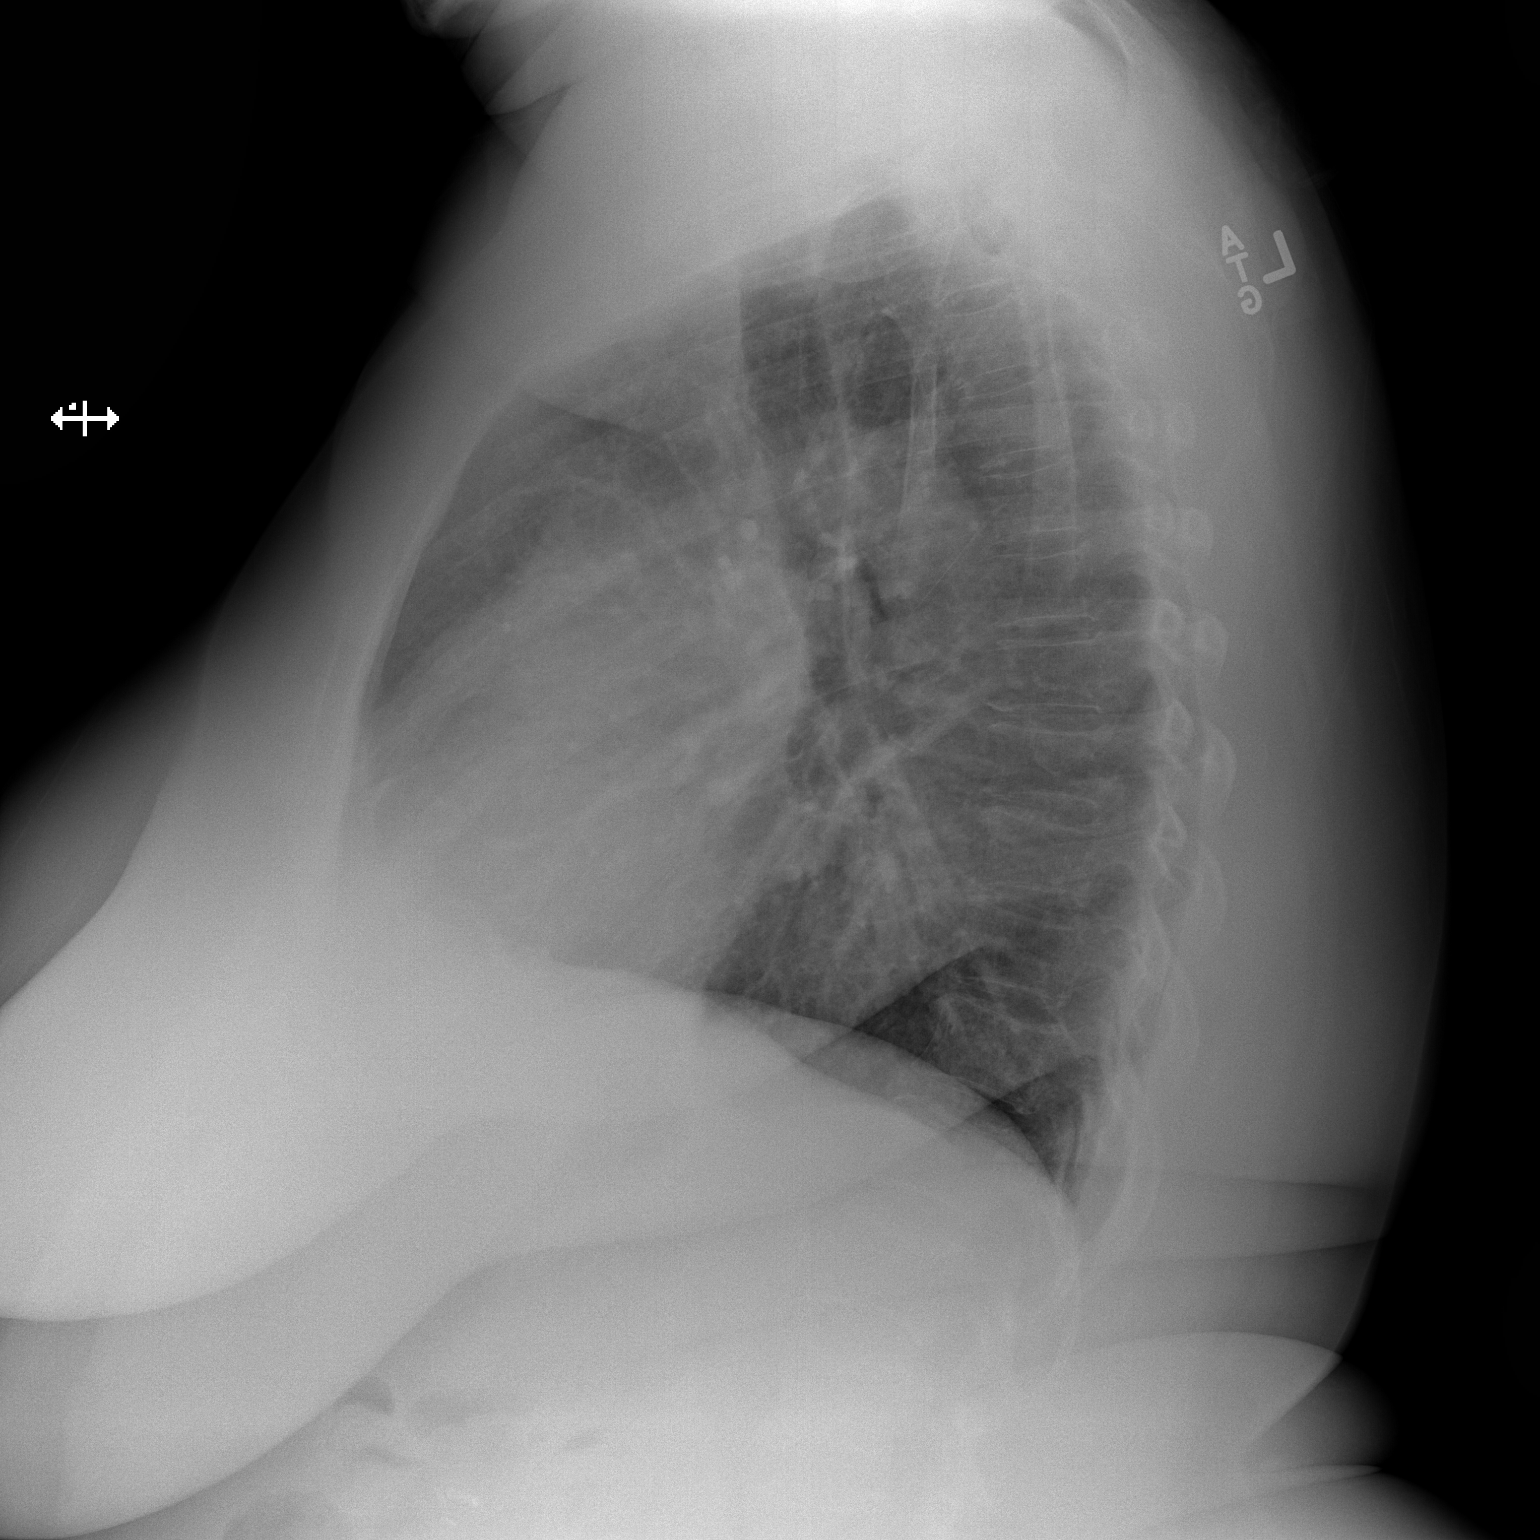

[2 of 2 positions shown; findings below may reference images not displayed]

PROCEDURE:     DXR - DXR CHEST PA (OR AP) AND LATERAL  - January 25, 2013  [DATE]

RESULT:     Comparison is made to the study January 03, 2013 and November 12, 2012.

The lungs are well-expanded. There is no focal infiltrate. The perihilar
lung markings are increased but this is not new. The cardiac silhouette is
normal in size. There is no pleural effusion. Old deformity of the upper
ribs on the left is present.
IMPRESSION: 1. There is no evidence of pneumonia nor definite evidence of CHF.
2. Mildly increased perihilar lung markings suggest subsegmental atelectasis
as might be seen with acute bronchitis. Leonardo Lemos films following therapy are
recommended to assure clearing.

[REDACTED]

## 2015-01-10 ENCOUNTER — Ambulatory Visit: Payer: Medicare Other | Admitting: Obstetrics and Gynecology

## 2015-01-11 ENCOUNTER — Encounter: Payer: Self-pay | Admitting: Obstetrics and Gynecology

## 2015-01-11 ENCOUNTER — Ambulatory Visit (INDEPENDENT_AMBULATORY_CARE_PROVIDER_SITE_OTHER): Payer: Medicare Other | Admitting: Obstetrics and Gynecology

## 2015-01-11 VITALS — BP 113/80 | HR 105 | Ht 68.0 in | Wt 257.4 lb

## 2015-01-11 DIAGNOSIS — L298 Other pruritus: Secondary | ICD-10-CM

## 2015-01-11 DIAGNOSIS — N898 Other specified noninflammatory disorders of vagina: Secondary | ICD-10-CM

## 2015-01-11 MED ORDER — ALPRAZOLAM 1 MG PO TABS: 1.0000 mg | ORAL_TABLET | Freq: Two times a day (BID) | ORAL | Status: DC

## 2015-01-11 MED ORDER — OXYCODONE HCL 15 MG PO TABS: 25.0000 mg | ORAL_TABLET | Freq: Four times a day (QID) | ORAL | Status: DC | PRN

## 2015-01-12 ENCOUNTER — Telehealth: Payer: Self-pay | Admitting: Obstetrics and Gynecology

## 2015-01-12 MED ORDER — LIDOCAINE HCL 2 % EX GEL: 1.0000 "application " | CUTANEOUS | Status: DC | PRN

## 2015-01-12 NOTE — Telephone Encounter (Signed)
I did e-scribe it yesterday, but I will send it again.

## 2015-01-12 NOTE — Telephone Encounter (Signed)
PT CALLED AND SAID THAT YOU WERE GOING TO SEND IN A CREAM FOR HER, AND SHE WENT TO THE PHARMACY AND THEY DON'T HAVE IT. SHE WOULD LIKE IT SENT TO THE [facility] ON [address]

## 2015-01-12 NOTE — Telephone Encounter (Signed)
Pt aware.

## 2015-01-13 NOTE — Progress Notes (Signed)
GYNECOLOGY PROGRESS NOTE  Subjective:    Patient ID: [name], female    DOB: Jun 09, 1956, 58 y.o.   MRN: [id number]  HPI  Patient is a 59 y.o. female who presents for complaints of continued vaginal itching.  Notes that the steroid cream she was using is causing vaginal burning.  Denies vaginal discharge other than the stool leakage.  Patient notes that she has also gotten the majority of her care switched to [facility].  Is due to begin pain clinic at the end of the month. Needs refill on Xanax and pain meds.   The following portions of the patient's history were reviewed and updated as appropriate: allergies, current medications, past family history, past medical history, past social history, past surgical history and problem list.  Review of Systems Pertinent items are noted in HPI.   Objective:   Blood pressure 113/80, pulse 105, height 5\' 8"  (1.727 m), weight 257 lb 6 oz (116.745 kg). General appearance: alert and no distress Pelvic: external genitalia with sparse hair distribution.  Old vaginal laceration noted on left labia minora. Vagina with no discharge in vault. Bimanual deferred.   Extremities: extremities normal, atraumatic, no cyanosis or edema   Assessment:  Vaginal itching Old vaginal laceration Chronic pain.   Plan:   On further discussion patient's pain and vaginal itching noted to be at the site of old laceration.  Will prescribe lidocaine gel to place over area.   Patient brought records of all appointments set at [facility]. Patient still needs referral to GYN for fistula, but has new PCP, [facility], and pain clinic specialist.  Last prescription given for pain medication.  Will also refill Xanax.  To f/u as needed.    Rubie Maid, MD [facility]

## 2015-01-23 DIAGNOSIS — N823 Fistula of vagina to large intestine: Secondary | ICD-10-CM | POA: Diagnosis not present

## 2015-01-25 ENCOUNTER — Telehealth: Payer: Self-pay | Admitting: Obstetrics and Gynecology

## 2015-01-25 IMAGING — CR DG ABDOMEN 3V
1 series · 5 of 5 positions shown · non-contrast
Comparison: none

REASON FOR EXAM: chest, abdominal pain following endoscopy
COMMENTS:

[Series 3: w chest pa · 0.14mm/px · 5 of 5 slices shown]
[im 1/5]
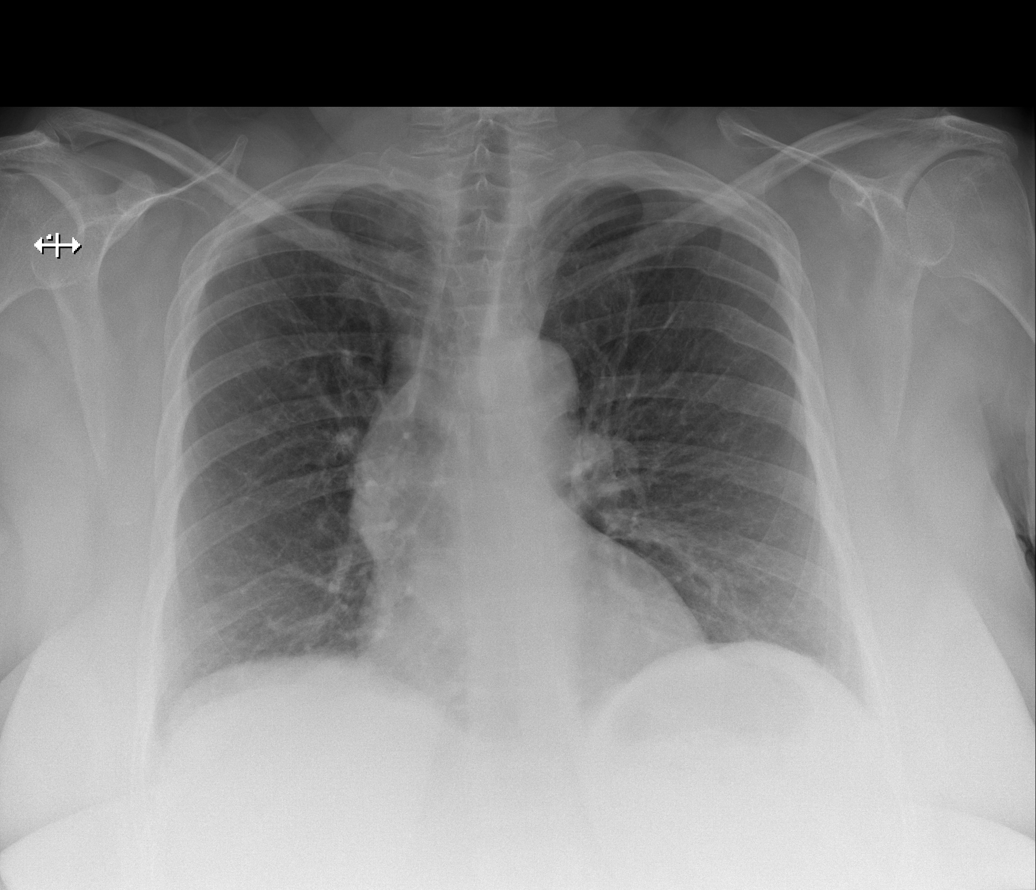
[im 2/5]
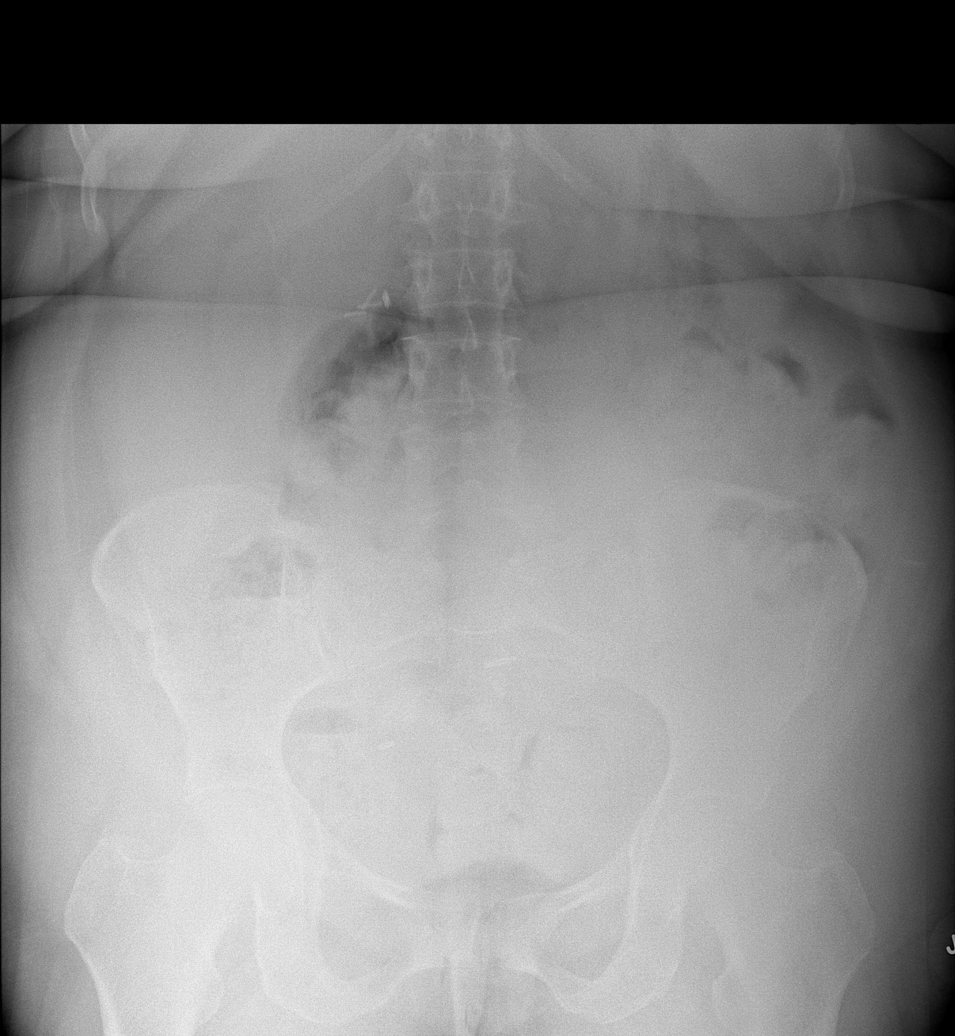
[im 3/5]
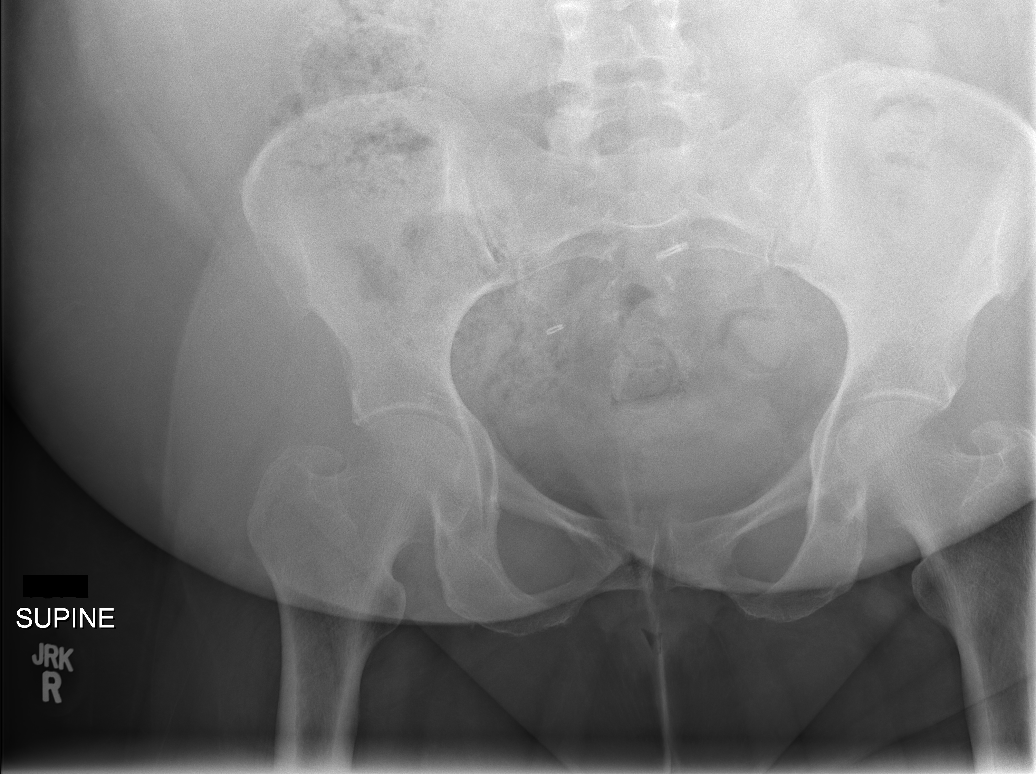
[im 4/5]
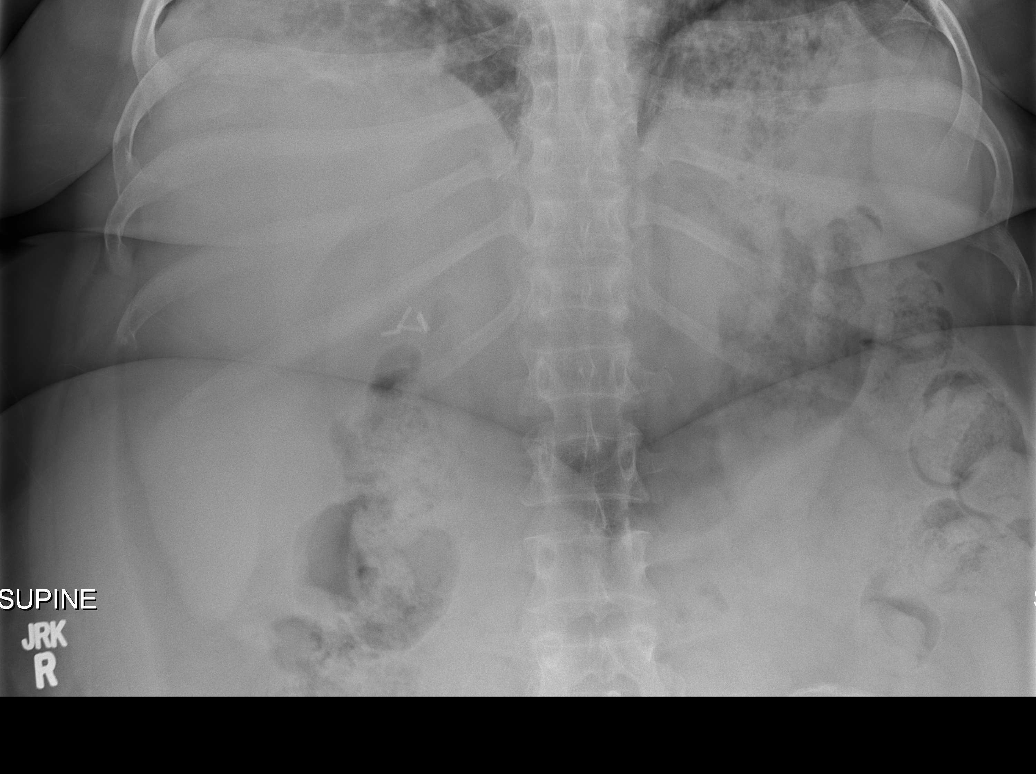
[im 5/5]
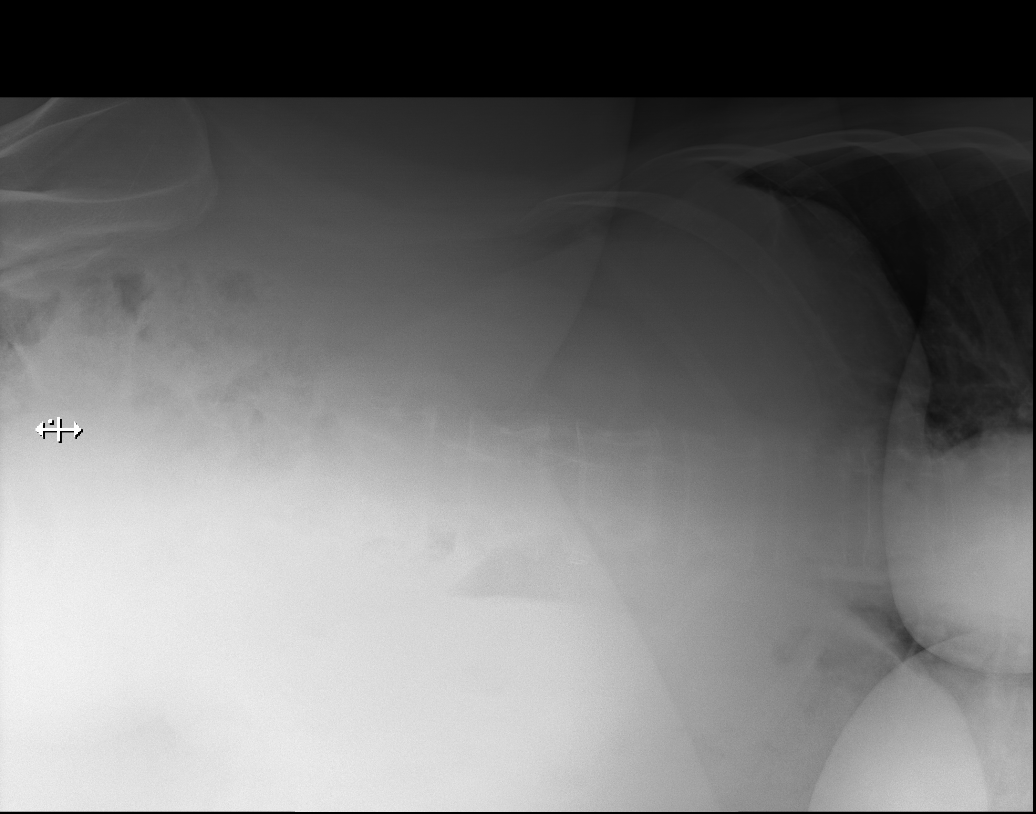

[5 of 5 positions shown; findings below may reference images not displayed]

PROCEDURE:     DXR - DXR ABDOMEN 3-WAY (INCL PA CXR)  - February 14, 2013 [DATE]

RESULT:     Comparison is made to the exam of 11/03/2011.

There is right hilar prominence for which followup PA and lateral views the
chest are recommended. This appearance was not seen on the previous study of
01/25/2013. Part of the appearance could be secondary to slight patient
rotation. The lungs are clear. The cardiac silhouette is normal.
Cholecystectomy clips are present. There is no free air. There is no
abnormal bowel distention.
IMPRESSION: 1. Prominence of the right hilar region which could be secondary to vascular
superimposition. Followup PA and lateral views the chest are recommended to
document a normal appearance of this region as seen on 01/25/2013.
2. No evidence of bowel obstruction or perforation.

[REDACTED]

## 2015-01-25 NOTE — Telephone Encounter (Signed)
[name] called and just wanted to let you know.Marland KitchenMarland KitchenMarland Kitchen

## 2015-01-25 NOTE — Telephone Encounter (Signed)
[name] wants you to know she met the surgeon at [facility] and is going to have surgery. She will be admitted for a couple of days for test first. She said to keep you promise and you know what that means. She will let you know when it is scheduled.

## 2015-01-26 NOTE — Telephone Encounter (Signed)
Ok. Thank you.

## 2015-01-31 ENCOUNTER — Encounter: Payer: Self-pay | Admitting: Emergency Medicine

## 2015-01-31 ENCOUNTER — Emergency Department
Admission: EM | Admit: 2015-01-31 | Discharge: 2015-01-31 | Disposition: A | Discharge status: Home / Self Care | Payer: Medicare Other | Attending: Emergency Medicine | Admitting: Emergency Medicine

## 2015-01-31 ENCOUNTER — Ambulatory Visit: Payer: Medicare Other | Admitting: Obstetrics and Gynecology

## 2015-01-31 DIAGNOSIS — Z720 Tobacco use: Secondary | ICD-10-CM | POA: Diagnosis not present

## 2015-01-31 DIAGNOSIS — M533 Sacrococcygeal disorders, not elsewhere classified: Secondary | ICD-10-CM | POA: Diagnosis not present

## 2015-01-31 DIAGNOSIS — F1010 Alcohol abuse, uncomplicated: Secondary | ICD-10-CM | POA: Diagnosis not present

## 2015-01-31 DIAGNOSIS — I129 Hypertensive chronic kidney disease with stage 1 through stage 4 chronic kidney disease, or unspecified chronic kidney disease: Secondary | ICD-10-CM | POA: Insufficient documentation

## 2015-01-31 DIAGNOSIS — M545 Low back pain: Secondary | ICD-10-CM | POA: Diagnosis not present

## 2015-01-31 DIAGNOSIS — N189 Chronic kidney disease, unspecified: Secondary | ICD-10-CM | POA: Insufficient documentation

## 2015-01-31 DIAGNOSIS — Z880 Allergy status to penicillin: Secondary | ICD-10-CM | POA: Diagnosis not present

## 2015-01-31 DIAGNOSIS — Z7951 Long term (current) use of inhaled steroids: Secondary | ICD-10-CM | POA: Diagnosis not present

## 2015-01-31 DIAGNOSIS — F1310 Sedative, hypnotic or anxiolytic abuse, uncomplicated: Secondary | ICD-10-CM | POA: Diagnosis not present

## 2015-01-31 DIAGNOSIS — G894 Chronic pain syndrome: Secondary | ICD-10-CM | POA: Diagnosis not present

## 2015-01-31 DIAGNOSIS — Z79899 Other long term (current) drug therapy: Secondary | ICD-10-CM | POA: Insufficient documentation

## 2015-01-31 DIAGNOSIS — F209 Schizophrenia, unspecified: Secondary | ICD-10-CM | POA: Diagnosis not present

## 2015-01-31 MED ORDER — KETOROLAC TROMETHAMINE 60 MG/2ML IM SOLN
60.0000 mg | Freq: Once | INTRAMUSCULAR | Status: AC
  Administered 2015-01-31: 60 mg via INTRAMUSCULAR
  Filled 2015-01-31: qty 2

## 2015-01-31 NOTE — ED Notes (Signed)
Pt arrived to the ED for pain in the lower back (sacrum area) after falling form her bed. Pt states that she got a CT scan today and was told that that "butt bone was cracked." Pt wants pain medication since her doctor did not give her any medications because she is a pain clinic Pt. Pt is AOx4 in mild pain discomfort.

## 2015-01-31 NOTE — ED Provider Notes (Signed)
[facility] Emergency Department Provider Note  ____________________________________________  Time seen: Approximately 9:15 PM  I have reviewed the triage vital signs and the nursing notes.   HISTORY  Chief Complaint Back Pain    HPI [name] is a 59 y.o. female presents for evaluation of low back pain. Patient states that she went to [facility] emergency room and was diagnosed with a fractured coccyx and was told to come over here for CT scan. Patient states that she has no pain medication and hasn't received any since June 16 from her pain management clinic. Desires something to help with the pain until she gets to her doctor's appointment on Thursday.   Past Medical History  Diagnosis Date  . Emphysema of lung   . Depression   . Diverticulitis   . Chronic bronchitis   . Malignant brain tumor 2007  . GERD (gastroesophageal reflux disease)   . Allergy   . Hepatitis C   . Hypertension   . Chronic kidney disease   . Migraines   . Urine incontinence   . Seizures   . Stroke 2007    during brain surgery  . Pancreatitis, alcoholic 5638  . Rectovaginal fistula   . H/O ETOH abuse     Sober since 2009  . H/O drug abuse     Clean since 2009  . Pancreatic ascites   . Cirrhosis     Patient Active Problem List   Diagnosis Date Noted  . HTN (hypertension) 11/05/2014  . Liver cirrhosis 07/19/2014  . 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 Chronic pancreatitis 11/03/2012  . Seizure disorder 11/03/2012  . Benign meningioma of brain 11/03/2012  . Screening for breast cancer 11/03/2012  . Chronic pelvic pain in female 11/03/2012  . Hepatitis C virus infection without hepatic coma 11/03/2012  . UTI (urinary tract infection) 11/03/2012  . Alcohol abuse, unspecified 11/03/2012  . Chronic liver disease 11/03/2012  . Schizophrenia 11/03/2012  . Narcotic abuse 11/03/2012  . Benign neoplasm of cerebral meninges 11/03/2012  . Breast screening  11/03/2012  . 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 Nondependent alcohol abuse 11/03/2012  . 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 Female genital symptoms 11/03/2012  . Infection of urinary tract 11/03/2012    Past Surgical History  Procedure Laterality Date  . Appendectomy  1999  . Eye surgery    . Rectovaginal fistula repair w/ colostomy  2011  . Brain surgery  2007    malignant brain tumor  . Cholecystectomy  2001  . Abdominal hysterectomy  1979    Current Outpatient Rx  Name  Route  Sig  Dispense  Refill  . albuterol (PROAIR HFA) 108 (90 BASE) MCG/ACT inhaler   Inhalation   Inhale 2 puffs into the lungs every 6 (six) hours as needed for wheezing or shortness of breath.         Marland Kitchen albuterol (PROVENTIL) (5 MG/ML) 0.5% nebulizer solution   Nebulization   Take 2.5 mg by nebulization 4 (four) times daily.         Marland Kitchen ALPRAZolam (XANAX) 1 MG tablet   Oral   Take 1 tablet (1 mg total) by mouth 2 (two) times daily.   120 tablet   0   . amitriptyline (ELAVIL) 50 MG tablet   Oral   Take 1 tablet (50 mg total) by mouth at bedtime.   30 tablet   3   . amLODipine (NORVASC) 10 MG tablet   Oral  Take 1 tablet (10 mg total) by mouth daily.   30 tablet   4   . cefdinir (OMNICEF) 300 MG capsule   Oral   Take 300 mg by mouth 2 (two) times daily.         . citalopram (CELEXA) 20 MG tablet   Oral   Take 20 mg by mouth every morning.         Marland Kitchen CREON 12000 UNITS CPEP   Oral   Take 2 capsules by mouth 3 (three) times daily before meals.          . docusate sodium (STOOL SOFTENER) 100 MG capsule   Oral   Take 100 mg by mouth.         . escitalopram (LEXAPRO) 10 MG tablet   Oral   Take 1 tablet (10 mg total) by mouth daily.   30 tablet   4   . fesoterodine (TOVIAZ) 8 MG TB24 tablet   Oral   Take 8 mg by mouth.         . Fluticasone-Salmeterol (ADVAIR) 250-50 MCG/DOSE AEPB   Inhalation   Inhale 1 puff into  the lungs daily as needed (shortness of breath).          . furosemide (LASIX) 20 MG tablet   Oral   Take 20 mg by mouth daily.         Marland Kitchen gabapentin (NEURONTIN) 300 MG capsule   Oral   Take 300 mg by mouth 2 (two) times daily as needed (pain).         . hydrOXYzine (ATARAX/VISTARIL) 25 MG tablet   Oral   Take 25 mg by mouth.         . lactulose (CHRONULAC) 10 GM/15ML solution   Oral   Take 20 g by mouth daily as needed for mild constipation.          . Ledipasvir-Sofosbuvir (HARVONI) 90-400 MG TABS   Oral   Take 1 tablet by mouth daily.   28 tablet   2   . levETIRAcetam (KEPPRA) 500 MG tablet   Oral   Take 1 tablet (500 mg total) by mouth 2 (two) times daily.   60 tablet   3   . lidocaine (XYLOCAINE) 2 % jelly   Topical   Apply 1 application topically as needed.   30 mL   2   . metFORMIN (GLUCOPHAGE) 1000 MG tablet   Oral   Take 1,000 mg by mouth 2 (two) times daily.         . metoprolol succinate (TOPROL-XL) 25 MG 24 hr tablet   Oral   Take 1 tablet (25 mg total) by mouth daily.   30 tablet   4   . Misc Natural Products (HORNY GOAT WEED) CAPS   Oral   Take 1 capsule by mouth.         Marland Kitchen omeprazole (PRILOSEC) 20 MG capsule   Oral   Take 1 capsule (20 mg total) by mouth daily.   30 capsule   11     Please stop all of the other PPIs if on profile   . oxyCODONE (ROXICODONE) 15 MG immediate release tablet   Oral   Take 1.5 tablets (22.5 mg total) by mouth every 6 (six) hours as needed for pain.   180 tablet   0   . promethazine (PHENERGAN) 25 MG tablet   Oral   Take 25 mg by mouth every 6 (six) hours as needed for  nausea or vomiting.          Marland Kitchen QUEtiapine (SEROQUEL) 100 MG tablet   Oral   Take 3.5 tablets (350 mg total) by mouth daily.   105 tablet   4   . ranitidine (ZANTAC) 150 MG capsule   Oral   Take 150 mg by mouth once.         . sertraline (ZOLOFT) 50 MG tablet   Oral   Take 50 mg by mouth daily.         .  solifenacin (VESICARE) 10 MG tablet   Oral   Take 10 mg by mouth daily.         . temazepam (RESTORIL) 30 MG capsule   Oral   Take 30 mg by mouth at bedtime.         Marland Kitchen terconazole (TERAZOL 7) 0.4 % vaginal cream   Vaginal   Place 1 applicator vaginally at bedtime.         Marland Kitchen tiotropium (SPIRIVA) 18 MCG inhalation capsule   Inhalation   Place 18 mcg into inhaler and inhale 2 (two) times daily as needed (shortness of breath).          . traMADol (ULTRAM) 50 MG tablet   Oral   Take 50 mg by mouth every 6 (six) hours as needed.         . triamcinolone (NASACORT ALLERGY 24HR) 55 MCG/ACT AERO nasal inhaler   Nasal   Place 2 sprays into the nose daily as needed (allergies).         Marland Kitchen Umeclidinium-Vilanterol 62.5-25 MCG/INH AEPB   Inhalation   Inhale 1 puff into the lungs daily as needed (for shortness of breath).         . zolpidem (AMBIEN) 10 MG tablet   Oral   Take 1 tablet (10 mg total) by mouth at bedtime.   30 tablet   3     Allergies Ciprofloxacin; Dilaudid; Hydromorphone; Ibuprofen; Sulfa antibiotics; Zofran; Zofran; Amoxicillin; Chocolate; Fentanyl; Penicillins; and Strawberry  Family History  Problem Relation Age of Onset  . Hypertension Mother   . Heart disease Father   . Diabetes Father   . Cancer Sister     brain  . Cancer Grandchild 8    brain tumor    Social History History  Substance Use Topics  . Smoking status: Current Some Day Smoker -- 0.25 packs/day    Types: Cigarettes  . Smokeless tobacco: Never Used     Comment: cutting back  . Alcohol Use: No     Comment: no alcohol since 2013    Review of Systems Constitutional: No fever/chills Eyes: No visual changes. ENT: No sore throat. Cardiovascular: Denies chest pain. Respiratory: Denies shortness of breath. Gastrointestinal: No abdominal pain.  No nausea, no vomiting.  No diarrhea.  No constipation. Genitourinary: Negative for dysuria. Musculoskeletal: Positive for low back  sacral and coccyx pain. Skin: Negative for rash. Neurological: Negative for headaches, focal weakness or numbness.  10-point ROS otherwise negative.  ____________________________________________   PHYSICAL EXAM:  VITAL SIGNS: ED Triage Vitals  Enc Vitals Group     BP 01/31/15 1934 113/79 mmHg     Pulse Rate 01/31/15 1934 99     Resp 01/31/15 1934 20     Temp 01/31/15 1934 98.6 F (37 C)     Temp Source 01/31/15 1934 Oral     SpO2 01/31/15 1934 98 %     Weight 01/31/15 1934 252 lb (114.306 kg)  Height 01/31/15 1934 5\' 8"  (1.727 m)     Head Cir --      Peak Flow --      Pain Score 01/31/15 1935 9     Pain Loc --      Pain Edu? --      Excl. in [place]? --     Constitutional: Alert and oriented. Well appearing and in no acute distress. Eyes: Conjunctivae are normal. PERRL. EOMI. Head: Atraumatic. Nose: No congestion/rhinnorhea. Mouth/Throat: Mucous membranes are moist.  Oropharynx non-erythematous. Neck: No stridor.   Cardiovascular: Normal rate, regular rhythm. Grossly normal heart sounds.  Good peripheral circulation. Respiratory: Normal respiratory effort.  No retractions. Lungs CTAB. Musculoskeletal: Positive sacral tenderness. Neurologic:  Normal speech and language. No gross focal neurologic deficits are appreciated. No gait instability. Skin:  Skin is warm, dry and intact. No rash noted. Psychiatric: Mood and affect are normal. Speech and behavior are normal.  ____________________________________________   LABS (all labs ordered are listed, but only abnormal results are displayed)  Labs Reviewed - No data to display ____________________________________________  EKG  Deferred ____________________________________________  RADIOLOGY  Deferred ____________________________________________   PROCEDURES  Procedure(s) performed: None  Critical Care performed: No  ____________________________________________   INITIAL IMPRESSION / ASSESSMENT AND PLAN /  ED COURSE  Pertinent labs & imaging results that were available during my care of the patient were reviewed by me and considered in my medical decision making (see chart for details).  Reviewed [place] controlled substance reporting system. Patient received 288 oxycodone since the last 18 days. She received 180 oxycodone 15 mg on July 8, and 108 oxycodone 20 mg on July 18. Explained the patient had no medication will be given prior to discharged secondary to her excessive use. She is to follow-up with her PCP. ____________________________________________   FINAL CLINICAL IMPRESSION(S) / ED DIAGNOSES  Final diagnoses:  Pain in the coccyx      Arlyss Repress, PA-C 01/31/15 2119  Carrie Mew, MD 01/31/15 2210

## 2015-01-31 NOTE — Discharge Instructions (Signed)
Tailbone Injury  The tailbone (coccyx) is the small bone at the lower end of the spine. A tailbone injury may involve stretched ligaments, bruising, or a broken bone (fracture). Women are more vulnerable to this injury due to having a wider pelvis.  CAUSES   This type of injury typically occurs from falling and landing on the tailbone. Repeated strain or friction from actions such as rowing and bicycling may also injure the area. The tailbone can be injured during childbirth. Infections or tumors may also press on the tailbone and cause pain. Sometimes, the cause of injury is unknown.  SYMPTOMS    Bruising.   Pain when sitting.   Painful bowel movements.   In women, pain during intercourse.  DIAGNOSIS   Your caregiver can diagnose a tailbone injury based on your symptoms and a physical exam. X-rays may be taken if a fracture is suspected. Your caregiver may also use an MRI scan imaging test to evaluate your symptoms.  TREATMENT   Your caregiver may prescribe medicines to help relieve your pain. Most tailbone injuries heal on their own in 4 to 6 weeks. However, if the injury is caused by an infection or tumor, the recovery period may vary.  PREVENTION   Wear appropriate padding and sports gear when bicycling and rowing. This can help prevent an injury from repeated strain or friction.  HOME CARE INSTRUCTIONS    Put ice on the injured area.   Put ice in a plastic bag.   Place a towel between your skin and the bag.   Leave the ice on for 15-20 minutes, every hour while awake for the first 1 to 2 days.   Sit on a large, rubber or inflated ring or cushion to ease your pain. Lean forward when sitting to help decrease discomfort.   Avoid sitting for long periods of time.   Increase your activity as the pain allows.   Only take over-the-counter or prescription medicines for pain, discomfort, or fever as directed by your caregiver.   You may use stool softeners if it is painful to have a bowel movement, or as  directed by your caregiver.   Eat a diet with plenty of fiber to help prevent constipation.   Keep all follow-up appointments as directed by your caregiver.  SEEK MEDICAL CARE IF:    Your pain becomes worse.   Your bowel movements cause a great deal of discomfort.   You are unable to have a bowel movement.   You have a fever.  MAKE SURE YOU:   Understand these instructions.   Will watch your condition.   Will get help right away if you are not doing well or get worse.  Document Released: 06/21/2000 Document Revised: 09/16/2011 Document Reviewed: 01/17/2011  ExitCare Patient Information 2015 [employer]. This information is not intended to replace advice given to you by your health care provider. Make sure you discuss any questions you have with your health care provider.

## 2015-02-01 ENCOUNTER — Ambulatory Visit (INDEPENDENT_AMBULATORY_CARE_PROVIDER_SITE_OTHER): Payer: Medicare Other | Admitting: Obstetrics and Gynecology

## 2015-02-01 ENCOUNTER — Ambulatory Visit: Payer: Medicare Other | Admitting: Obstetrics and Gynecology

## 2015-02-01 ENCOUNTER — Encounter: Payer: Self-pay | Admitting: Obstetrics and Gynecology

## 2015-02-01 VITALS — BP 131/78 | HR 99 | Ht 68.0 in | Wt 262.3 lb

## 2015-02-01 DIAGNOSIS — N898 Other specified noninflammatory disorders of vagina: Secondary | ICD-10-CM | POA: Diagnosis not present

## 2015-02-01 DIAGNOSIS — G8929 Other chronic pain: Secondary | ICD-10-CM | POA: Diagnosis not present

## 2015-02-02 DIAGNOSIS — K2100 Gastro-esophageal reflux disease with esophagitis, without bleeding: Secondary | ICD-10-CM | POA: Insufficient documentation

## 2015-02-02 DIAGNOSIS — F5101 Primary insomnia: Secondary | ICD-10-CM | POA: Insufficient documentation

## 2015-02-02 DIAGNOSIS — S322XXA Fracture of coccyx, initial encounter for closed fracture: Secondary | ICD-10-CM | POA: Insufficient documentation

## 2015-02-02 DIAGNOSIS — F329 Major depressive disorder, single episode, unspecified: Secondary | ICD-10-CM | POA: Diagnosis not present

## 2015-02-02 DIAGNOSIS — E119 Type 2 diabetes mellitus without complications: Secondary | ICD-10-CM | POA: Diagnosis not present

## 2015-02-02 DIAGNOSIS — G40409 Other generalized epilepsy and epileptic syndromes, not intractable, without status epilepticus: Secondary | ICD-10-CM | POA: Diagnosis not present

## 2015-02-02 DIAGNOSIS — J449 Chronic obstructive pulmonary disease, unspecified: Secondary | ICD-10-CM | POA: Insufficient documentation

## 2015-02-02 DIAGNOSIS — N393 Stress incontinence (female) (male): Secondary | ICD-10-CM | POA: Insufficient documentation

## 2015-02-02 DIAGNOSIS — K861 Other chronic pancreatitis: Secondary | ICD-10-CM | POA: Diagnosis not present

## 2015-02-03 ENCOUNTER — Telehealth: Payer: Self-pay | Admitting: Obstetrics and Gynecology

## 2015-02-03 NOTE — Telephone Encounter (Signed)
CHASE FROM [facility] CALLED AND SAID [name] WANTS TO GET RX OF OXYCODONE FILLED HOWEVER... DR Alcario Drought ALREADY GAVE HER A RX OF OXYCODONE 20 MH 180 PILLS AND SHE GOT THAT FILLED ON 7/18. HE WANTS TO KNOW IF CAN REFILL THE RX DR. CHERRY GAVE GER FOR 15 MG 1 1/2 PILL EVERY 4 GHRS 180 PILLS

## 2015-02-03 NOTE — Telephone Encounter (Signed)
Pt walks into office demanding that she see Dr. Marcelline Mates or her nurse and when told they were not here she said she wanted somebody, she didn't care who to tell them to fill her medication (oxycodone). After researching pt has had rx on 01/11/15--#180; 01/23/2015 #108, and rx given on 02/01/15 by Dr. Marcelline Mates.  I informed pt that I could not approve this rx when she had it refilled on 01/23/2015.  Pharmacy notified and stated pt got rx when she left. Pt told me to tell them she was coming to pick the rx up. Pt left office and said she was going to get rx filled at another pharmacy.

## 2015-02-06 ENCOUNTER — Telehealth: Payer: Self-pay | Admitting: Obstetrics and Gynecology

## 2015-02-06 NOTE — Telephone Encounter (Signed)
She said there is a police investigation, her CNA is under investigation for stealing her oxycodone and she has a police report to prove this and wants to bring it up her so she can get her meds refilled. She wants you to call her. But Ii'm thinking Dr. Marcelline Mates needs to call her and discuss this with her.

## 2015-02-06 NOTE — Telephone Encounter (Signed)
Pt called and stated taht she has the police report and she wanted to make an appt to show you but then she said she didn't really need to make an appt to show you the police report that her CNA was stilling her medication, so she stated she was going to fax the police report to [place] so that you could sign off on her getting her pain meds. She stated her brother worked a a funeral home up the road from her house and that is were she is going to fax it from. She stated taht the pharmacy will need our permission to fill the RX once you have seen the police report.

## 2015-02-06 NOTE — Telephone Encounter (Signed)
I spoke with pt again and she was informed that Dr. Marcelline Mates did not make it over today. We did receive police report that her medication was reported stolen on 02/05/15 at 14:50. Pt had stated previously that she was not to say anything about this because it was under investigation. Pt states she has been vomiting for last hour and could she take phenergan with her meds and I informed her that she would need to contact the doctor that prescribes them to her for their advice.(or go to [place] She states her court date is 03/15/15 and the CNA has been calling her and asking her to forgive her and she had to be told not to call anymore or other charges would be filed. Pt also states she had stolen other things from her such as food, soap, tissues, etc. Her [employer] worker was there today and has advised her meds come to her mother's home-monthly, and her mother could bring them to her a week at the time. Also suggested the place her CNA was hired from needs to be investigated also. This CNA, pt states has been charged 3x before for the same thing.  Pt states she has to see psychiatrist before the pain clinic at [facility] can help her. Nothing is relieving her pain (Tramadol, IBP-of which she is not supposed to take) and if Dr. Marcelline Mates could fill rx for pain medication one more time she would not ask again. I reassured pt that I would let Dr. Marcelline Mates know this information tomorrow.

## 2015-02-06 NOTE — Progress Notes (Signed)
GYNECOLOGY PROGRESS NOTE  Subjective:    Patient ID: [name], female    DOB: [birth year], 59 y.o.   MRN: [id number]  Vaginal Discharge The patient's primary symptoms include vaginal discharge.    Patient is a 59 y.o. female who presents for complaints of continued vaginal itching and black vaginal discharge with odor.  Also desires refill on refill on pain meds.  Notes that she was seen at the pain clinic in [facility], however they do not prescribe pain medications at the initial visit.  Reports that she has also been referred to a Psychiatrist in [place] so assess her current psychiatric state as well as adjust her medication profile.   The following portions of the patient's history were reviewed and updated as appropriate: allergies, current medications, past family history, past medical history, past social history, past surgical history and problem list.  Review of Systems Pertinent items are noted in HPI.   Objective:   Blood pressure 131/78, pulse 99, height 5\' 8"  (1.727 m), weight 262 lb 4.8 oz (118.978 kg). General appearance: alert and no distress Pelvic: external genitalia with sparse hair distribution.  Vagina with no discharge in vault. Bimanual deferred.   Extremities: extremities normal, atraumatic, no cyanosis or edema   Assessment:   1) Vaginal discharge 2) Rectovaginal fistula 3) Chronic pain  Plan:   1) No discharge noted again on today's exam.  Black discharge could be secondary to old blood in stools.  Would recommend colonoscopy.  Patient notes that her new PCP at [facility] has also recommended this and is working to get her scheduled.   2) Has been seen by pain clinic.  Brought documentation of recommendations by pain clinic and that no meds were prescribed. Will give 1 additional refill.  3) Encouraged to keep all appointments.  4) Notes that she is being scheduled for repair of RV fistula at [facility].  5) Follow up as needed.    Rubie Maid, MD [facility]

## 2015-02-06 NOTE — Telephone Encounter (Signed)
Pt would like nurse to approve her rx by Dr. Marcelline Mates and I did inform pt that I could not do that but I would speak with Dr. Marcelline Mates today (she is currently in surgery). Pt states she does have a police report that her medications were stolen, not just her pain medication and that they have arrested her CNA.  Pt states that she didn't say anything Friday because the police told her not to. She states that she has been out of pain medication x8 days. Also states that she is bleeding around the brain where a tumor has come back and pushing metal plate causing severe headaches. Pt also apologized for being rude Friday when she came by to get her Rx approved at pharmacy. I informed pt that I would need to speak with Dr. Marcelline Mates and also we need to see the police report. I will contact pt when I have spoken to Dr. Marcelline Mates.

## 2015-02-07 NOTE — Telephone Encounter (Signed)
Please inform patient that I have spoken to the pharmacist at [facility] who let me know that she has had several prescriptions for Oxycodone and Percocet written by different doctors over the past month filled at different pharmacies.  I am not inclined to fill her refill request at this time and will not be filling any more pain prescriptions.  She is still free to see me as a patient to address any other concerns that are related to GYN.

## 2015-02-08 ENCOUNTER — Telehealth: Payer: Self-pay | Admitting: Obstetrics and Gynecology

## 2015-02-08 DIAGNOSIS — R109 Unspecified abdominal pain: Secondary | ICD-10-CM | POA: Insufficient documentation

## 2015-02-08 DIAGNOSIS — G894 Chronic pain syndrome: Secondary | ICD-10-CM | POA: Insufficient documentation

## 2015-02-08 NOTE — Telephone Encounter (Signed)
Patient called requesting a call back from you. Thanks

## 2015-02-08 NOTE — Telephone Encounter (Signed)
[facility]  [place] called about [name], she said you needed to verify any thime she gets a RX on her.   [phone]

## 2015-02-08 NOTE — Telephone Encounter (Signed)
PT CALLED THIS AM AND WAS CRYING ON THE PHONE AND STATED SHE WANTED YOU TO CALL HER.

## 2015-02-08 NOTE — Telephone Encounter (Signed)
Took last pain pill yesterday, she had some left over from the 6th rx and had another filled on the 18th (that was stolen). Pt informed of Dr. Marcelline Mates unable to refill any further pain prescriptions but will see her for any other gyn problems.

## 2015-02-08 NOTE — Telephone Encounter (Signed)
I sent a message to [facility] yesterday afternoon regarding this patient.  I don't think that she was able to contacted her yesterday. I will follow up.

## 2015-02-10 DIAGNOSIS — J441 Chronic obstructive pulmonary disease with (acute) exacerbation: Secondary | ICD-10-CM | POA: Diagnosis not present

## 2015-02-10 DIAGNOSIS — R1010 Upper abdominal pain, unspecified: Secondary | ICD-10-CM | POA: Diagnosis not present

## 2015-02-18 IMAGING — CR DG CHEST 1V PORT
1 series · 1 of 1 positions shown · non-contrast
Comparison: none

REASON FOR EXAM: shortness of breath
COMMENTS:

[ap]
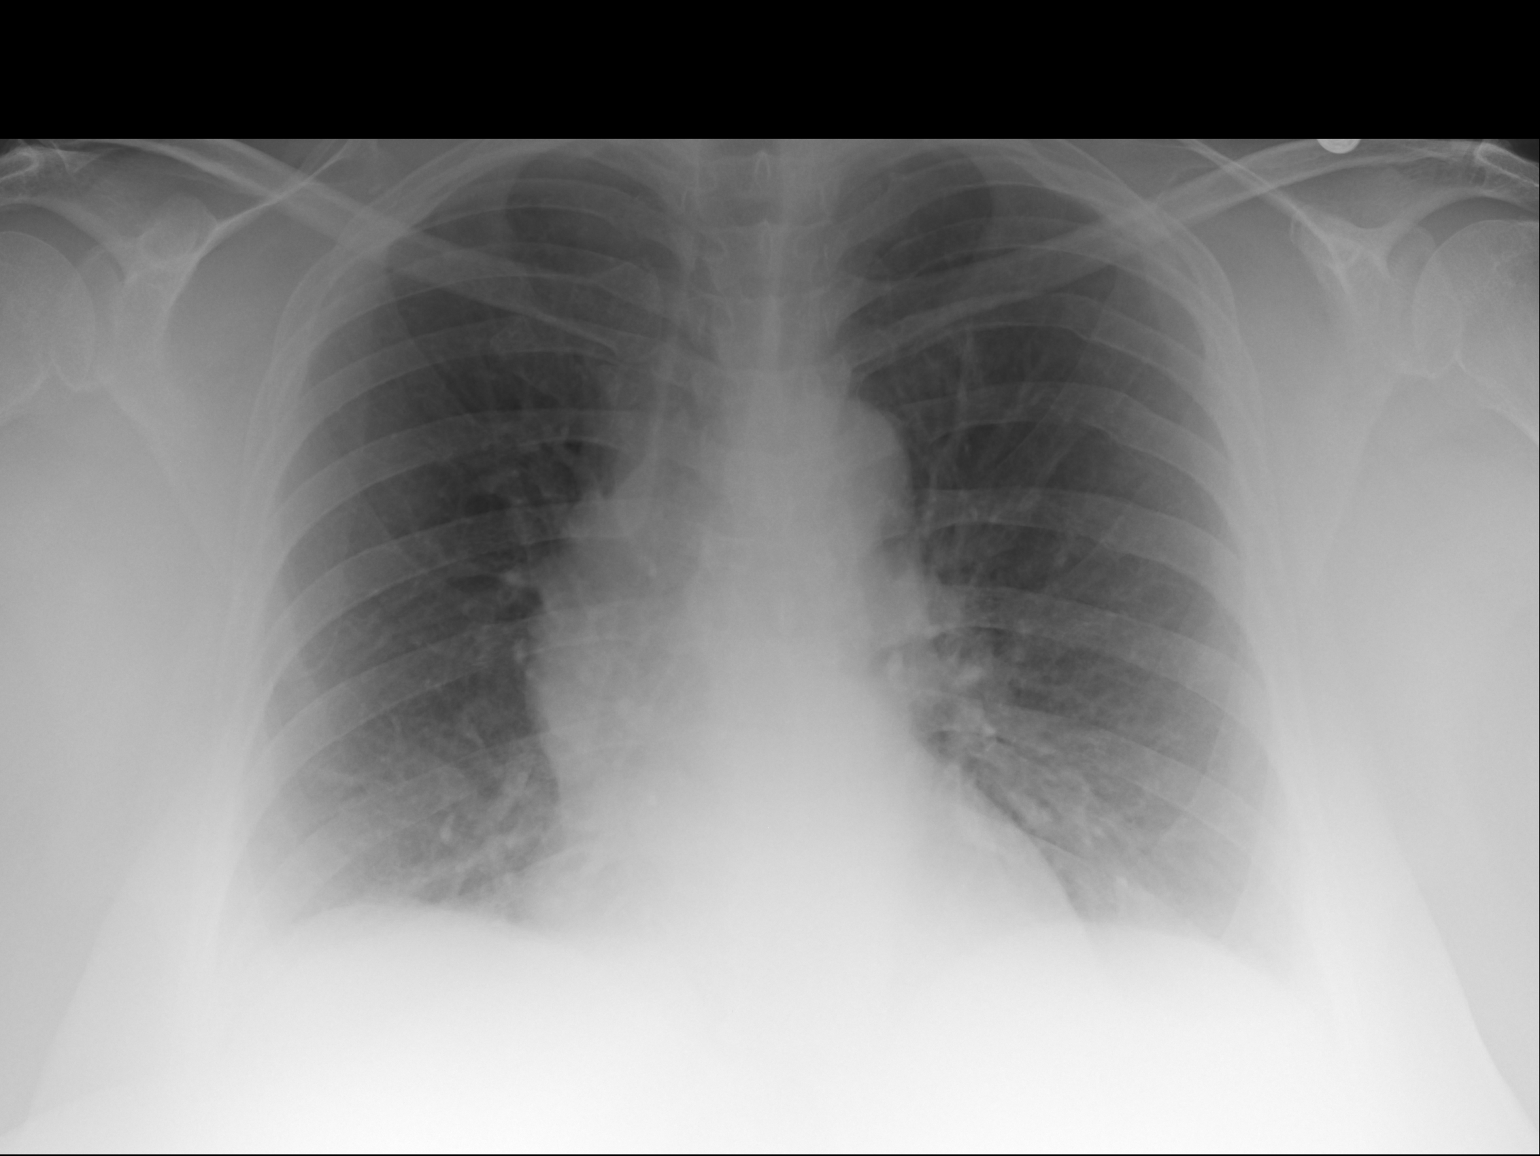

[1 of 1 positions shown; findings below may reference images not displayed]

PROCEDURE:     DXR - DXR PORTABLE CHEST SINGLE VIEW  - March 10, 2013  [DATE]

RESULT:     Comparison is made to the previous exam from 01/25/2013.

The lungs are clear. The heart and pulmonary vessels are normal. The bony
and mediastinal structures are unremarkable. There is no effusion. There is
no pneumothorax or evidence of congestive failure.
IMPRESSION: No acute cardiopulmonary disease.

[REDACTED]

## 2015-02-21 ENCOUNTER — Emergency Department
Admission: EM | Admit: 2015-02-21 | Discharge: 2015-02-21 | Disposition: A | Discharge status: Home / Self Care | Payer: Medicare Other | Attending: Emergency Medicine | Admitting: Emergency Medicine

## 2015-02-21 DIAGNOSIS — R05 Cough: Secondary | ICD-10-CM | POA: Insufficient documentation

## 2015-02-21 DIAGNOSIS — F1010 Alcohol abuse, uncomplicated: Secondary | ICD-10-CM | POA: Diagnosis not present

## 2015-02-21 DIAGNOSIS — G8929 Other chronic pain: Secondary | ICD-10-CM | POA: Diagnosis not present

## 2015-02-21 DIAGNOSIS — E119 Type 2 diabetes mellitus without complications: Secondary | ICD-10-CM | POA: Diagnosis not present

## 2015-02-21 DIAGNOSIS — Z720 Tobacco use: Secondary | ICD-10-CM | POA: Diagnosis not present

## 2015-02-21 DIAGNOSIS — R1012 Left upper quadrant pain: Secondary | ICD-10-CM | POA: Diagnosis present

## 2015-02-21 DIAGNOSIS — I129 Hypertensive chronic kidney disease with stage 1 through stage 4 chronic kidney disease, or unspecified chronic kidney disease: Secondary | ICD-10-CM | POA: Insufficient documentation

## 2015-02-21 DIAGNOSIS — Z792 Long term (current) use of antibiotics: Secondary | ICD-10-CM | POA: Insufficient documentation

## 2015-02-21 DIAGNOSIS — Z880 Allergy status to penicillin: Secondary | ICD-10-CM | POA: Diagnosis not present

## 2015-02-21 DIAGNOSIS — R1084 Generalized abdominal pain: Secondary | ICD-10-CM | POA: Diagnosis not present

## 2015-02-21 DIAGNOSIS — N189 Chronic kidney disease, unspecified: Secondary | ICD-10-CM | POA: Diagnosis not present

## 2015-02-21 DIAGNOSIS — R112 Nausea with vomiting, unspecified: Secondary | ICD-10-CM | POA: Diagnosis not present

## 2015-02-21 DIAGNOSIS — Z79899 Other long term (current) drug therapy: Secondary | ICD-10-CM | POA: Insufficient documentation

## 2015-02-21 DIAGNOSIS — N390 Urinary tract infection, site not specified: Secondary | ICD-10-CM | POA: Insufficient documentation

## 2015-02-21 LAB — URINALYSIS COMPLETE WITH MICROSCOPIC (ARMC ONLY)
BILIRUBIN URINE: NEGATIVE
GLUCOSE, UA: NEGATIVE mg/dL
Ketones, ur: NEGATIVE mg/dL
NITRITE: POSITIVE — AB
Protein, ur: NEGATIVE mg/dL
RBC / HPF: NONE SEEN RBC/hpf (ref 0–5)
Specific Gravity, Urine: 1.004 — ABNORMAL LOW (ref 1.005–1.030)
pH: 6 (ref 5.0–8.0)

## 2015-02-21 LAB — COMPREHENSIVE METABOLIC PANEL
ALBUMIN: 3.6 g/dL (ref 3.5–5.0)
ALT: 21 U/L (ref 14–54)
ANION GAP: 9 (ref 5–15)
AST: 39 U/L (ref 15–41)
Alkaline Phosphatase: 112 U/L (ref 38–126)
BILIRUBIN TOTAL: 0.8 mg/dL (ref 0.3–1.2)
BUN: 6 mg/dL (ref 6–20)
CO2: 23 mmol/L (ref 22–32)
Calcium: 8.9 mg/dL (ref 8.9–10.3)
Chloride: 106 mmol/L (ref 101–111)
Creatinine, Ser: 0.81 mg/dL (ref 0.44–1.00)
GFR calc Af Amer: >60 mL/min (ref >60)
GFR calc non Af Amer: >60 mL/min (ref >60)
GLUCOSE: 101 mg/dL — AB (ref 65–99)
POTASSIUM: 3.1 mmol/L — AB (ref 3.5–5.1)
SODIUM: 138 mmol/L (ref 135–145)
TOTAL PROTEIN: 8 g/dL (ref 6.5–8.1)

## 2015-02-21 LAB — CBC
HEMATOCRIT: 33.6 % — AB (ref 35.0–47.0)
HEMOGLOBIN: 10.5 g/dL — AB (ref 12.0–16.0)
MCH: 24 pg — AB (ref 26.0–34.0)
MCHC: 31.3 g/dL — AB (ref 32.0–36.0)
MCV: 76.7 fL — AB (ref 80.0–100.0)
Platelets: 138 10*3/uL — ABNORMAL LOW (ref 150–440)
RBC: 4.38 MIL/uL (ref 3.80–5.20)
RDW: 19 % — AB (ref 11.5–14.5)
WBC: 5.7 10*3/uL (ref 3.6–11.0)

## 2015-02-21 LAB — LIPASE, BLOOD: Lipase: 116 U/L — ABNORMAL HIGH (ref 22–51)

## 2015-02-21 MED ORDER — CEPHALEXIN 500 MG PO CAPS
500.0000 mg | ORAL_CAPSULE | Freq: Once | ORAL | Status: AC
  Administered 2015-02-21: 500 mg via ORAL
  Filled 2015-02-21: qty 1

## 2015-02-21 MED ORDER — PROMETHAZINE HCL 25 MG/ML IJ SOLN
25.0000 mg | Freq: Once | INTRAMUSCULAR | Status: AC
  Administered 2015-02-21: 25 mg via INTRAMUSCULAR

## 2015-02-21 MED ORDER — PROMETHAZINE HCL 25 MG/ML IJ SOLN
12.5000 mg | Freq: Once | INTRAMUSCULAR | Status: DC
  Filled 2015-02-21 (×2): qty 1

## 2015-02-21 MED ORDER — CEPHALEXIN 500 MG PO CAPS: 500.0000 mg | ORAL_CAPSULE | Freq: Three times a day (TID) | ORAL | Status: DC

## 2015-02-21 NOTE — ED Notes (Signed)
Pt made aware of wait time following injection.  Pt verbalizes understanding.

## 2015-02-21 NOTE — Discharge Instructions (Signed)
As we discussed, we believe your pancreas is a little bit inflamed because of something that you ate or drank (likely the beer yesterday).  This is an acute exacerbation of her chronic issues.  We recommend that you stick to a bland diet and absolutely do not drink any alcohol.  Continue your regular pain medications and follow-up with your regular doctor at the next available opportunity.  If he develop new or worsening symptoms please return to the emergency department.  Please also take the prescribed antibiotics for your urinary tract infection and follow up on this with your regular doctor as well.

## 2015-02-21 NOTE — ED Provider Notes (Signed)
[facility] Emergency Department Provider Note  ____________________________________________  Time seen: Approximately 7:31 PM  I have reviewed the triage vital signs and the nursing notes.   HISTORY  Chief Complaint Abdominal Pain    HPI [name] is a 59 y.o. female with extensive PMH including chronic abdominal pain, schizophrenia, drug abuse including narcotics dependence, and a history of alcohol abuse with most of her doctors being in [place] who presents today with what she reports as 3 days of nausea, vomiting, and left upper quadrant pain that feels like her prior "inflamed pancreas".  She states that she has no one started but there is been severe and gradual in onset.  When I pressed her for more details she does report drinking one beer yesterday in an attempt to "make the pain better".  She states that she is all out of the 288 oxycodone 15 mg tablets that she has been prescribed over the course of the last month.  Nothing makes her pain better and movement makes it worse.   Past Medical History  Diagnosis Date  . Emphysema of lung   . Depression   . Diverticulitis   . Chronic bronchitis   . Malignant brain tumor 2007  . GERD (gastroesophageal reflux disease)   . Allergy   . Hepatitis C   . Hypertension   . Chronic kidney disease   . Migraines   . Urine incontinence   . Seizures   . Stroke 2007    during brain surgery  . Pancreatitis, alcoholic 7680  . Rectovaginal fistula   . H/O ETOH abuse     Sober since 2009  . H/O drug abuse     Clean since 2009  . Pancreatic ascites   . Cirrhosis     Patient Active Problem List   Diagnosis Date Noted  . ALC (alcoholic liver cirrhosis) 12/23/2014  . Clinical depression 12/23/2014  . Anxiety 12/23/2014  . Diabetes mellitus, type 2 12/23/2014  . HTN (hypertension) 11/05/2014  . Liver cirrhosis 07/19/2014  . 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 Chronic pancreatitis 11/03/2012  .  Seizure disorder 11/03/2012  . Benign meningioma of brain 11/03/2012  . Screening for breast cancer 11/03/2012  . Chronic pelvic pain in female 11/03/2012  . Hepatitis C virus infection without hepatic coma 11/03/2012  . UTI (urinary tract infection) 11/03/2012  . Alcohol abuse, unspecified 11/03/2012  . Chronic liver disease 11/03/2012  . Schizophrenia 11/03/2012  . Narcotic abuse 11/03/2012  . Benign neoplasm of cerebral meninges 11/03/2012  . Breast screening 11/03/2012  . 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 Nondependent alcohol abuse 11/03/2012  . 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 Female genital symptoms 11/03/2012  . Infection of urinary tract 11/03/2012    Past Surgical History  Procedure Laterality Date  . Appendectomy  1999  . Eye surgery    . Rectovaginal fistula repair w/ colostomy  2011  . Brain surgery  2007    malignant brain tumor  . Cholecystectomy  2001  . Abdominal hysterectomy  1979    Current Outpatient Rx  Name  Route  Sig  Dispense  Refill  . ADVAIR DISKUS 500-50 MCG/DOSE AEPB      inhale 1 dose by mouth twice a day      0     Dispense as written.   Marland Kitchen albuterol (PROAIR HFA) 108 (90 BASE) MCG/ACT inhaler               .  ALPRAZolam (XANAX) 1 MG tablet   Oral   Take 1 tablet (1 mg total) by mouth 2 (two) times daily.   120 tablet   0   . amitriptyline (ELAVIL) 50 MG tablet   Oral   Take 1 tablet (50 mg total) by mouth at bedtime.   30 tablet   3   . amLODipine (NORVASC) 10 MG tablet   Oral   Take 1 tablet (10 mg total) by mouth daily.   30 tablet   4   . busPIRone (BUSPAR) 10 MG tablet   Oral   Take 10 mg by mouth.         . cefdinir (OMNICEF) 300 MG capsule   Oral   Take 300 mg by mouth 2 (two) times daily.         . clobetasol cream (TEMOVATE) 0.05 %               . CREON 12000 UNITS CPEP   Oral   Take 2 capsules by mouth 3 (three) times daily before meals.           . docusate sodium (STOOL SOFTENER) 100 MG capsule   Oral   Take 100 mg by mouth.         . escitalopram (LEXAPRO) 10 MG tablet   Oral   Take 1 tablet (10 mg total) by mouth daily.   30 tablet   4   . fesoterodine (TOVIAZ) 8 MG TB24 tablet   Oral   Take 8 mg by mouth.         . furosemide (LASIX) 20 MG tablet   Oral   Take 20 mg by mouth daily.         Marland Kitchen gabapentin (NEURONTIN) 300 MG capsule   Oral   Take 300 mg by mouth 2 (two) times daily as needed (pain).         . hydrOXYzine (ATARAX/VISTARIL) 25 MG tablet   Oral   Take 25 mg by mouth.         . Ipratropium-Albuterol (COMBIVENT) 20-100 MCG/ACT AERS respimat   Inhalation   Inhale into the lungs.         . lactulose (CHRONULAC) 10 GM/15ML solution   Oral   Take 20 g by mouth daily as needed for mild constipation.          . Ledipasvir-Sofosbuvir (HARVONI) 90-400 MG TABS   Oral   Take 1 tablet by mouth daily.   28 tablet   2   . levETIRAcetam (KEPPRA) 500 MG tablet   Oral   Take 1 tablet (500 mg total) by mouth 2 (two) times daily.   60 tablet   3   . levofloxacin (LEVAQUIN) 500 MG tablet   Oral   Take 500 mg by mouth.         . lidocaine (XYLOCAINE) 2 % jelly   Topical   Apply 1 application topically as needed.   30 mL   2   . Melatonin 3 MG TABS   Oral   Take 10 mg by mouth.         . metFORMIN (GLUCOPHAGE) 1000 MG tablet   Oral   Take 1,000 mg by mouth 2 (two) times daily.         . metoCLOPramide (REGLAN) 10 MG tablet   Oral   Take by mouth.         . metoprolol succinate (TOPROL-XL) 25 MG 24 hr tablet  Oral   Take 1 tablet (25 mg total) by mouth daily.   30 tablet   4   . Misc Natural Products (HORNY GOAT WEED) CAPS   Oral   Take 1 capsule by mouth.         Marland Kitchen omeprazole (PRILOSEC) 20 MG capsule   Oral   Take 1 capsule (20 mg total) by mouth daily.   30 capsule   11     Please stop all of the other PPIs if on profile   . ondansetron (ZOFRAN) 8  MG tablet   Oral   Take by mouth.         . oxyCODONE (ROXICODONE) 15 MG immediate release tablet   Oral   Take 1.5 tablets (22.5 mg total) by mouth every 6 (six) hours as needed for pain.   180 tablet   0   . PARoxetine (PAXIL) 20 MG tablet   Oral   Take 20 mg by mouth.         . pravastatin (PRAVACHOL) 10 MG tablet               . predniSONE (DELTASONE) 5 MG tablet               . promethazine (PHENERGAN) 25 MG tablet   Oral   Take 25 mg by mouth.         . QUEtiapine (SEROQUEL) 100 MG tablet   Oral   Take 3.5 tablets (350 mg total) by mouth daily.   105 tablet   4   . ranitidine (ZANTAC) 150 MG capsule   Oral   Take 150 mg by mouth once.         . saxagliptin HCl (ONGLYZA) 2.5 MG TABS tablet   Oral   Take 2.5 mg by mouth.         . sertraline (ZOLOFT) 50 MG tablet   Oral   Take 50 mg by mouth daily.         . solifenacin (VESICARE) 10 MG tablet   Oral   Take 10 mg by mouth.         . temazepam (RESTORIL) 30 MG capsule   Oral   Take 30 mg by mouth at bedtime.         Marland Kitchen terconazole (TERAZOL 7) 0.4 % vaginal cream   Vaginal   Place 1 applicator vaginally at bedtime.         Marland Kitchen tiotropium (SPIRIVA) 18 MCG inhalation capsule   Inhalation   Place 18 mcg into inhaler and inhale 2 (two) times daily as needed (shortness of breath).          . traMADol (ULTRAM) 50 MG tablet               . triamcinolone (NASACORT ALLERGY 24HR) 55 MCG/ACT AERO nasal inhaler   Nasal   Place 2 sprays into the nose daily as needed (allergies).         Marland Kitchen Umeclidinium-Vilanterol 62.5-25 MCG/INH AEPB   Inhalation   Inhale 1 puff into the lungs daily as needed (for shortness of breath).         . zolpidem (AMBIEN) 10 MG tablet   Oral   Take 1 tablet (10 mg total) by mouth at bedtime.   30 tablet   3     Allergies Ciprofloxacin; Dilaudid; Hydromorphone; Ibuprofen; Sulfa antibiotics; Zofran; Zofran; Amoxicillin; Chocolate; Fentanyl;  Penicillins; and Strawberry  Family History  Problem Relation Age of Onset  .  Hypertension Mother   . Heart disease Father   . Diabetes Father   . Cancer Sister     brain  . Cancer Grandchild 8    brain tumor    Social History Social History  Substance Use Topics  . Smoking status: Current Some Day Smoker -- 0.25 packs/day    Types: Cigarettes  . Smokeless tobacco: Never Used     Comment: cutting back  . Alcohol Use: No     Comment: no alcohol since 2013    Review of Systems Constitutional: No fever/chills Eyes: No visual changes. ENT: No sore throat. Cardiovascular: Denies chest pain. Respiratory: Reports chronic productive cough Gastrointestinal: Severe abdominal pain similar to prior with multiple episodes of nausea and vomiting as well as several loose stools  Genitourinary: Negative for dysuria. Musculoskeletal: Negative for back pain. Skin: Negative for rash. Neurological: Negative for headaches, focal weakness or numbness.  10-point ROS otherwise negative.  ____________________________________________   PHYSICAL EXAM:  VITAL SIGNS: ED Triage Vitals  Enc Vitals Group     BP 02/21/15 1450 121/97 mmHg     Pulse Rate 02/21/15 1450 97     Resp 02/21/15 1450 18     Temp 02/21/15 1450 98.5 F (36.9 C)     Temp Source 02/21/15 1450 Oral     SpO2 02/21/15 1450 98 %     Weight 02/21/15 1450 251 lb (113.853 kg)     Height 02/21/15 1450 5\' 8"  (1.727 m)     Head Cir --      Peak Flow --      Pain Score 02/21/15 1450 10     Pain Loc --      Pain Edu? --      Excl. in [place]? --     Constitutional: Alert and oriented.  Has appearance of chronic illness but is not in acute distress though she is holding her emesis bag Eyes: Conjunctivae are normal. PERRL. EOMI. Head: Atraumatic. Nose: No congestion/rhinnorhea. Mouth/Throat: Mucous membranes are moist.  Oropharynx non-erythematous. Neck: No stridor.   Cardiovascular: Normal rate, regular rhythm. Grossly normal  heart sounds.  Good peripheral circulation. Respiratory: Normal respiratory effort.  No retractions. Lungs CTAB. Gastrointestinal: Soft and obese with generalized tenderness throughout.  I do not appreciate any distention or swelling like she describes.  There is no specific focal tenderness to palpation. Musculoskeletal: No lower extremity tenderness nor edema.  No joint effusions. Neurologic:  Normal speech and language. No gross focal neurologic deficits are appreciated.  Skin:  Skin is warm, dry and intact. No rash noted. Psychiatric: Calm, borderline tearful when discussing her symptoms and her recent alcohol use, no concerning acute abnormalities or guarding her psychiatric evaluation  ____________________________________________   LABS (all labs ordered are listed, but only abnormal results are displayed)  Labs Reviewed  LIPASE, BLOOD - Abnormal; Notable for the following:    Lipase 116 (*)    All other components within normal limits  COMPREHENSIVE METABOLIC PANEL - Abnormal; Notable for the following:    Potassium 3.1 (*)    Glucose, Bld 101 (*)    All other components within normal limits  CBC - Abnormal; Notable for the following:    Hemoglobin 10.5 (*)    HCT 33.6 (*)    MCV 76.7 (*)    MCH 24.0 (*)    MCHC 31.3 (*)    RDW 19.0 (*)    Platelets 138 (*)    All other components within normal limits  URINALYSIS COMPLETEWITH  MICROSCOPIC ([facility] ONLY) - Abnormal; Notable for the following:    Color, Urine YELLOW (*)    APPearance CLEAR (*)    Specific Gravity, Urine 1.004 (*)    Hgb urine dipstick 1+ (*)    Nitrite POSITIVE (*)    Leukocytes, UA 2+ (*)    Bacteria, UA RARE (*)    Squamous Epithelial / LPF 0-5 (*)    All other components within normal limits   ____________________________________________  EKG  Not obtained ____________________________________________  RADIOLOGY Not  indicated ____________________________________________   PROCEDURES  Procedure(s) performed: None  Critical Care performed: No ____________________________________________   INITIAL IMPRESSION / ASSESSMENT AND PLAN / ED COURSE  Pertinent labs & imaging results that were available during my care of the patient were reviewed by me and considered in my medical decision making (see chart for details).  Patient is a frequent visitor with similar symptoms and a frequently mildly elevated lipase.  Her lipase is slightly elevated today but is similar to prior labs.  Other than a mild urinary tract infection she has no other concerning findings at this time.  I explained that her outpatient narcotics should be sufficient and that she absolutely needs to abstain from alcohol.  I gave her a dose of IM Phenergan and she has Phenergan at home as well.  She told me she plans to follow up tomorrow with her doctors at [facility] and that she understands my usual and customary return precautions.  She left ambulatory in no apparent discomfort.  ____________________________________________  FINAL CLINICAL IMPRESSION(S) / ED DIAGNOSES  Final diagnoses:  Urinary tract infection without hematuria, site unspecified  chronic abdominal pain    NEW MEDICATIONS STARTED DURING THIS VISIT:  New Prescriptions   No medications on file     Hinda Kehr, MD 02/21/15 2022

## 2015-02-21 NOTE — ED Notes (Signed)
Pt c/o LUQ pain with N/V/D for the past 3 days..states she has a hx of inflammed spleen and pancreas and this feels the same.

## 2015-02-24 DIAGNOSIS — R2681 Unsteadiness on feet: Secondary | ICD-10-CM | POA: Diagnosis not present

## 2015-03-03 ENCOUNTER — Telehealth: Payer: Self-pay | Admitting: Obstetrics and Gynecology

## 2015-03-03 MED ORDER — TERCONAZOLE 0.4 % VA CREA: 1.0000 | TOPICAL_CREAM | Freq: Every day | VAGINAL | Status: DC

## 2015-03-03 NOTE — Telephone Encounter (Signed)
Pt called and she was seen in the ER and has a UTI and they gave her and antibiotic and it has given her a yeast infection, so she wanted to know if doctor cherry will give her the RX of the cream that she gave her last time.

## 2015-03-03 NOTE — Telephone Encounter (Signed)
rx sent in, pt aware 

## 2015-03-05 DIAGNOSIS — K219 Gastro-esophageal reflux disease without esophagitis: Secondary | ICD-10-CM | POA: Diagnosis not present

## 2015-03-05 DIAGNOSIS — E669 Obesity, unspecified: Secondary | ICD-10-CM | POA: Diagnosis not present

## 2015-03-05 DIAGNOSIS — R1084 Generalized abdominal pain: Secondary | ICD-10-CM | POA: Diagnosis not present

## 2015-03-05 DIAGNOSIS — Z7951 Long term (current) use of inhaled steroids: Secondary | ICD-10-CM | POA: Diagnosis not present

## 2015-03-05 DIAGNOSIS — B182 Chronic viral hepatitis C: Secondary | ICD-10-CM | POA: Diagnosis not present

## 2015-03-05 DIAGNOSIS — Z885 Allergy status to narcotic agent status: Secondary | ICD-10-CM | POA: Diagnosis not present

## 2015-03-05 DIAGNOSIS — N189 Chronic kidney disease, unspecified: Secondary | ICD-10-CM | POA: Diagnosis not present

## 2015-03-05 DIAGNOSIS — J449 Chronic obstructive pulmonary disease, unspecified: Secondary | ICD-10-CM | POA: Diagnosis not present

## 2015-03-05 DIAGNOSIS — F17210 Nicotine dependence, cigarettes, uncomplicated: Secondary | ICD-10-CM | POA: Diagnosis not present

## 2015-03-05 DIAGNOSIS — M810 Age-related osteoporosis without current pathological fracture: Secondary | ICD-10-CM | POA: Diagnosis not present

## 2015-03-05 DIAGNOSIS — F419 Anxiety disorder, unspecified: Secondary | ICD-10-CM | POA: Diagnosis not present

## 2015-03-05 DIAGNOSIS — K7030 Alcoholic cirrhosis of liver without ascites: Secondary | ICD-10-CM | POA: Diagnosis not present

## 2015-03-05 DIAGNOSIS — N390 Urinary tract infection, site not specified: Secondary | ICD-10-CM | POA: Diagnosis not present

## 2015-03-05 DIAGNOSIS — E119 Type 2 diabetes mellitus without complications: Secondary | ICD-10-CM | POA: Diagnosis not present

## 2015-03-05 DIAGNOSIS — K852 Alcohol induced acute pancreatitis: Secondary | ICD-10-CM | POA: Diagnosis not present

## 2015-03-05 DIAGNOSIS — Z9049 Acquired absence of other specified parts of digestive tract: Secondary | ICD-10-CM | POA: Diagnosis not present

## 2015-03-05 DIAGNOSIS — Z880 Allergy status to penicillin: Secondary | ICD-10-CM | POA: Diagnosis not present

## 2015-03-05 DIAGNOSIS — Z882 Allergy status to sulfonamides status: Secondary | ICD-10-CM | POA: Diagnosis not present

## 2015-03-05 DIAGNOSIS — R10819 Abdominal tenderness, unspecified site: Secondary | ICD-10-CM | POA: Diagnosis not present

## 2015-03-05 DIAGNOSIS — Z8673 Personal history of transient ischemic attack (TIA), and cerebral infarction without residual deficits: Secondary | ICD-10-CM | POA: Diagnosis not present

## 2015-03-05 DIAGNOSIS — Z79899 Other long term (current) drug therapy: Secondary | ICD-10-CM | POA: Diagnosis not present

## 2015-03-05 DIAGNOSIS — G40909 Epilepsy, unspecified, not intractable, without status epilepticus: Secondary | ICD-10-CM | POA: Diagnosis not present

## 2015-03-05 DIAGNOSIS — K625 Hemorrhage of anus and rectum: Secondary | ICD-10-CM | POA: Diagnosis not present

## 2015-03-05 DIAGNOSIS — F329 Major depressive disorder, single episode, unspecified: Secondary | ICD-10-CM | POA: Diagnosis not present

## 2015-03-05 DIAGNOSIS — I129 Hypertensive chronic kidney disease with stage 1 through stage 4 chronic kidney disease, or unspecified chronic kidney disease: Secondary | ICD-10-CM | POA: Diagnosis not present

## 2015-03-08 DIAGNOSIS — E119 Type 2 diabetes mellitus without complications: Secondary | ICD-10-CM | POA: Diagnosis not present

## 2015-03-08 DIAGNOSIS — I129 Hypertensive chronic kidney disease with stage 1 through stage 4 chronic kidney disease, or unspecified chronic kidney disease: Secondary | ICD-10-CM | POA: Diagnosis not present

## 2015-03-08 DIAGNOSIS — N189 Chronic kidney disease, unspecified: Secondary | ICD-10-CM | POA: Diagnosis not present

## 2015-03-08 DIAGNOSIS — Z980 Intestinal bypass and anastomosis status: Secondary | ICD-10-CM | POA: Diagnosis not present

## 2015-03-08 DIAGNOSIS — K861 Other chronic pancreatitis: Secondary | ICD-10-CM | POA: Diagnosis not present

## 2015-03-08 DIAGNOSIS — R109 Unspecified abdominal pain: Secondary | ICD-10-CM | POA: Diagnosis not present

## 2015-03-08 DIAGNOSIS — Z8673 Personal history of transient ischemic attack (TIA), and cerebral infarction without residual deficits: Secondary | ICD-10-CM | POA: Diagnosis not present

## 2015-03-08 DIAGNOSIS — J439 Emphysema, unspecified: Secondary | ICD-10-CM | POA: Diagnosis not present

## 2015-03-08 DIAGNOSIS — Z538 Procedure and treatment not carried out for other reasons: Secondary | ICD-10-CM | POA: Diagnosis not present

## 2015-03-08 DIAGNOSIS — G40909 Epilepsy, unspecified, not intractable, without status epilepticus: Secondary | ICD-10-CM | POA: Diagnosis not present

## 2015-03-08 DIAGNOSIS — M810 Age-related osteoporosis without current pathological fracture: Secondary | ICD-10-CM | POA: Diagnosis not present

## 2015-03-09 DIAGNOSIS — K860 Alcohol-induced chronic pancreatitis: Secondary | ICD-10-CM | POA: Diagnosis not present

## 2015-03-09 DIAGNOSIS — F1010 Alcohol abuse, uncomplicated: Secondary | ICD-10-CM | POA: Diagnosis not present

## 2015-03-09 DIAGNOSIS — F329 Major depressive disorder, single episode, unspecified: Secondary | ICD-10-CM | POA: Diagnosis not present

## 2015-03-09 DIAGNOSIS — K769 Liver disease, unspecified: Secondary | ICD-10-CM | POA: Diagnosis not present

## 2015-03-09 IMAGING — CR DG ABDOMEN 2V
1 series · 5 of 5 positions shown · non-contrast
Comparison: none

REASON FOR EXAM: CONSTIPATION
COMMENTS:

PROCEDURE:     DXR - DXR ABDOMEN 2 V FLAT AND ERECT  - March 29, 2013  [DATE]
RESULT:     Soft tissue structures are unremarkable. Surgical clip right
upper quadrant and pelvis. Nondilated nonspecific air-filled loops of small
bowel. Stool in colon. No free air.

[Series 10: w abdomen upright · 0.14mm/px · 5 of 5 slices shown]
[im 1/5]
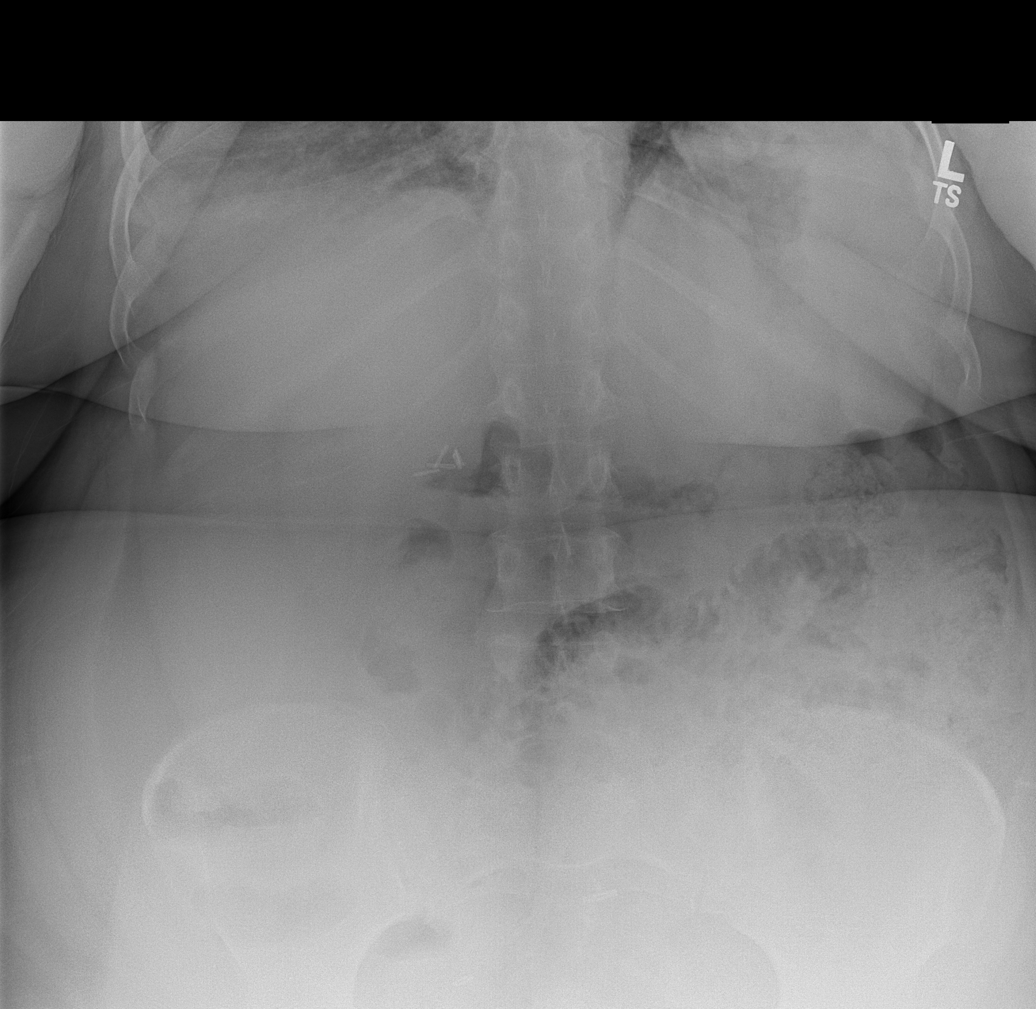
[im 2/5]
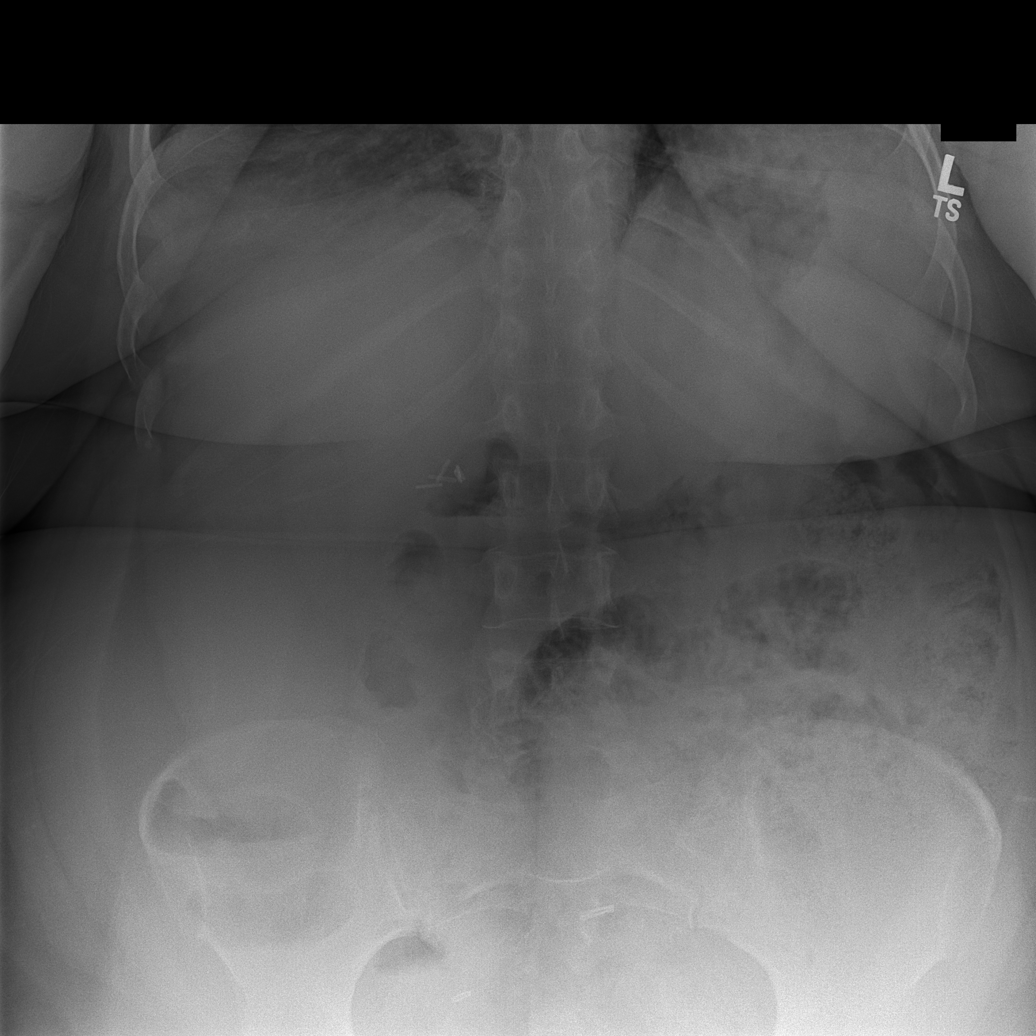
[im 3/5]
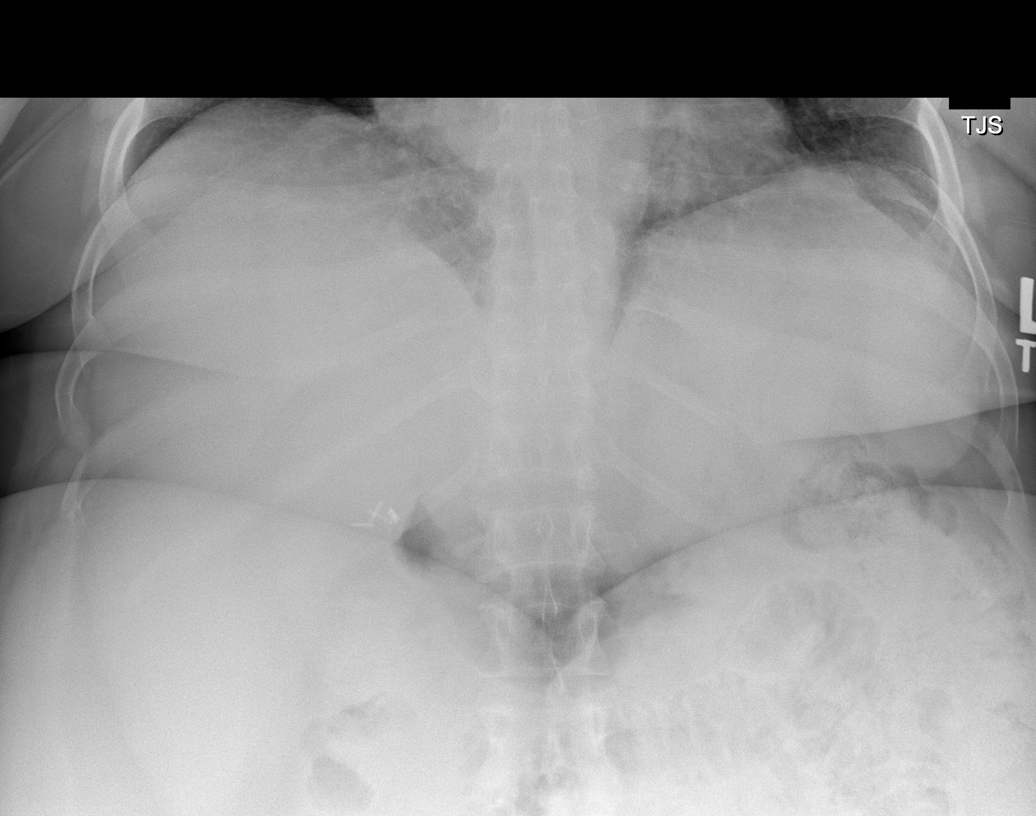
[im 4/5]
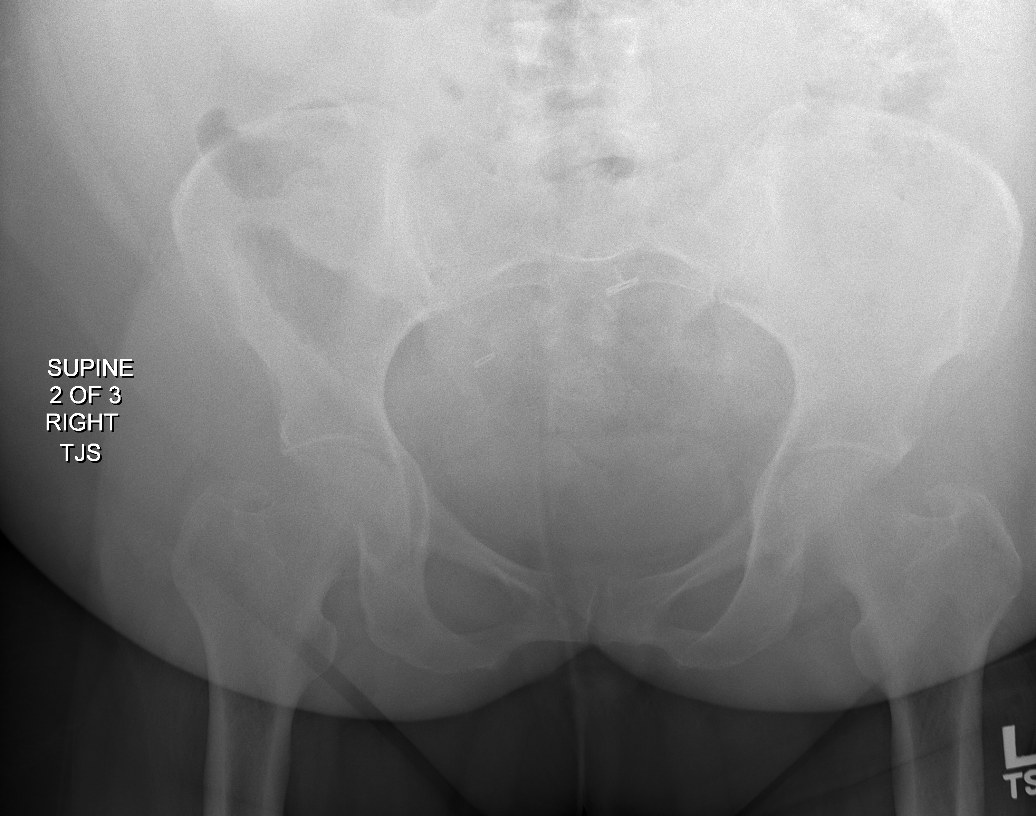
[im 5/5]
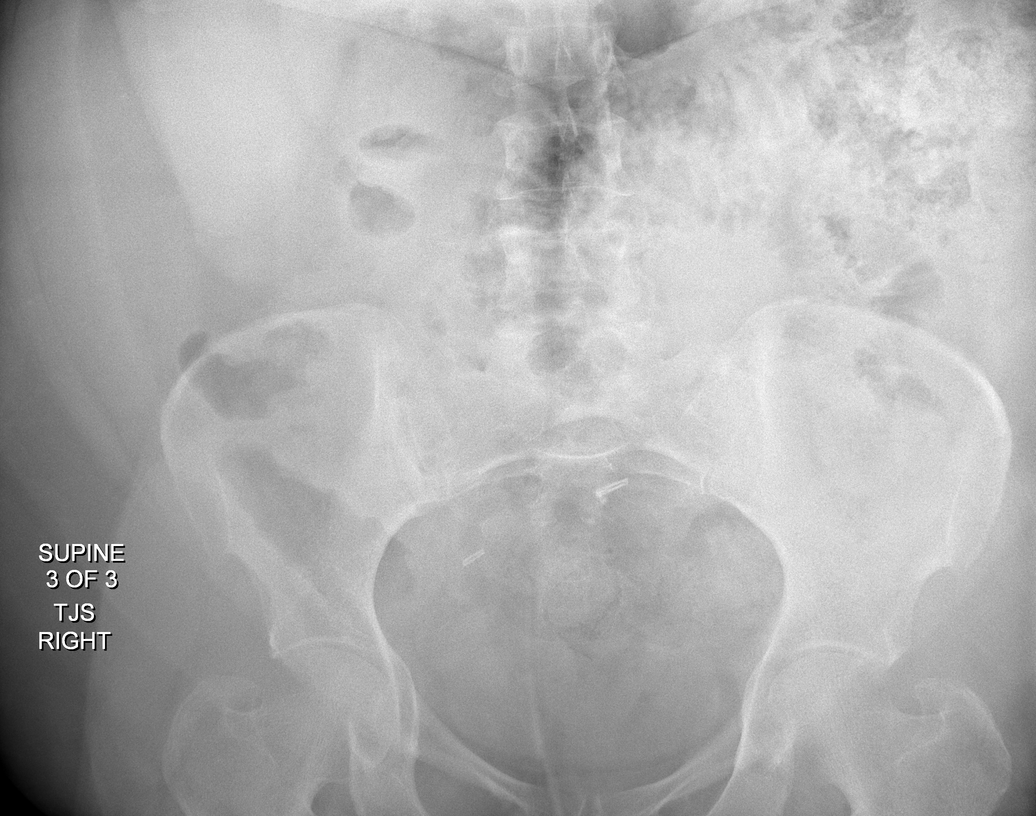

[5 of 5 positions shown; findings below may reference images not displayed]

IMPRESSION: Nonspecific exam.

## 2015-03-11 ENCOUNTER — Emergency Department
Admission: EM | Admit: 2015-03-11 | Discharge: 2015-03-11 | Disposition: A | Discharge status: Home / Self Care | Payer: Medicare Other | Attending: Emergency Medicine | Admitting: Emergency Medicine

## 2015-03-11 ENCOUNTER — Encounter: Payer: Self-pay | Admitting: *Deleted

## 2015-03-11 DIAGNOSIS — R109 Unspecified abdominal pain: Secondary | ICD-10-CM | POA: Insufficient documentation

## 2015-03-11 DIAGNOSIS — E119 Type 2 diabetes mellitus without complications: Secondary | ICD-10-CM | POA: Diagnosis not present

## 2015-03-11 DIAGNOSIS — F10920 Alcohol use, unspecified with intoxication, uncomplicated: Secondary | ICD-10-CM | POA: Diagnosis not present

## 2015-03-11 DIAGNOSIS — Z9049 Acquired absence of other specified parts of digestive tract: Secondary | ICD-10-CM | POA: Diagnosis not present

## 2015-03-11 DIAGNOSIS — F209 Schizophrenia, unspecified: Secondary | ICD-10-CM | POA: Insufficient documentation

## 2015-03-11 DIAGNOSIS — K861 Other chronic pancreatitis: Secondary | ICD-10-CM | POA: Insufficient documentation

## 2015-03-11 DIAGNOSIS — Z720 Tobacco use: Secondary | ICD-10-CM | POA: Insufficient documentation

## 2015-03-11 DIAGNOSIS — R079 Chest pain, unspecified: Secondary | ICD-10-CM | POA: Diagnosis not present

## 2015-03-11 DIAGNOSIS — I129 Hypertensive chronic kidney disease with stage 1 through stage 4 chronic kidney disease, or unspecified chronic kidney disease: Secondary | ICD-10-CM | POA: Diagnosis not present

## 2015-03-11 DIAGNOSIS — N189 Chronic kidney disease, unspecified: Secondary | ICD-10-CM | POA: Insufficient documentation

## 2015-03-11 DIAGNOSIS — G8929 Other chronic pain: Secondary | ICD-10-CM | POA: Diagnosis not present

## 2015-03-11 DIAGNOSIS — R1084 Generalized abdominal pain: Secondary | ICD-10-CM | POA: Diagnosis not present

## 2015-03-11 DIAGNOSIS — Z87440 Personal history of urinary (tract) infections: Secondary | ICD-10-CM | POA: Diagnosis not present

## 2015-03-11 DIAGNOSIS — K860 Alcohol-induced chronic pancreatitis: Secondary | ICD-10-CM | POA: Diagnosis not present

## 2015-03-11 DIAGNOSIS — Z880 Allergy status to penicillin: Secondary | ICD-10-CM | POA: Diagnosis not present

## 2015-03-11 MED ORDER — PROMETHAZINE HCL 25 MG PO TABS
50.0000 mg | ORAL_TABLET | Freq: Once | ORAL | Status: AC
  Administered 2015-03-11: 50 mg via ORAL
  Filled 2015-03-11: qty 2

## 2015-03-11 NOTE — ED Notes (Signed)
Pt arrives via EMS with severe abd pain, pt states she was diagnoised with UTI and pancrititis a week ago at [facility], pt states she has no urinated since yesterday, pt states abd distention and tenderness, pt also states some depression, pt states hx of septic shock

## 2015-03-11 NOTE — ED Provider Notes (Signed)
[facility] Emergency Department Provider Note     Time seen: ----------------------------------------- 3:00 PM on 03/11/2015 -----------------------------------------    I have reviewed the triage vital signs and the nursing notes.   HISTORY  Chief Complaint Abdominal Pain    HPI [name] is a 59 y.o. female  who presents ER with severe abdominal pain. Patient states she was diagnosed with UTI and pancreatitis but a week ago at [facility]. Patient states she hasn't had alcohol about 3 weeks. She last drank water somewhat sentenced life in prison. Patient states she has not urinated since yesterday. Also states her abdomen has been distended and tender, does report history of depression and septic shock. Past Medical History  Diagnosis Date  . Emphysema of lung   . Depression   . Diverticulitis   . Chronic bronchitis   . Malignant brain tumor 2007  . GERD (gastroesophageal reflux disease)   . Allergy   . Hepatitis C   . Hypertension   . Chronic kidney disease   . Migraines   . Urine incontinence   . Seizures   . Stroke 2007    during brain surgery  . Pancreatitis, alcoholic 0160  . Rectovaginal fistula   . H/O ETOH abuse     Sober since 2009  . H/O drug abuse     Clean since 2009  . Pancreatic ascites   . Cirrhosis     Patient Active Problem List   Diagnosis Date Noted  . ALC (alcoholic liver cirrhosis) 12/23/2014  . Clinical depression 12/23/2014  . Anxiety 12/23/2014  . Diabetes mellitus, type 2 12/23/2014  . HTN (hypertension) 11/05/2014  . Liver cirrhosis 07/19/2014  . 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 Chronic pancreatitis 11/03/2012  . Seizure disorder 11/03/2012  . Benign meningioma of brain 11/03/2012  . Screening for breast cancer 11/03/2012  . Chronic pelvic pain in female 11/03/2012  . Hepatitis C virus infection without hepatic coma 11/03/2012  . UTI (urinary tract infection) 11/03/2012  . Alcohol abuse,  unspecified 11/03/2012  . Chronic liver disease 11/03/2012  . Schizophrenia 11/03/2012  . Narcotic abuse 11/03/2012  . Benign neoplasm of cerebral meninges 11/03/2012  . Breast screening 11/03/2012  . 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 Nondependent alcohol abuse 11/03/2012  . 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 Female genital symptoms 11/03/2012  . Infection of urinary tract 11/03/2012    Past Surgical History  Procedure Laterality Date  . Appendectomy  1999  . Eye surgery    . Rectovaginal fistula repair w/ colostomy  2011  . Brain surgery  2007    malignant brain tumor  . Cholecystectomy  2001  . Abdominal hysterectomy  1979    Allergies Ciprofloxacin; Dilaudid; Hydromorphone; Ibuprofen; Sulfa antibiotics; Zofran; Zofran; Amoxicillin; Chocolate; Fentanyl; Penicillins; and Strawberry  Social History Social History  Substance Use Topics  . Smoking status: Current Some Day Smoker -- 0.25 packs/day    Types: Cigarettes  . Smokeless tobacco: Never Used     Comment: cutting back  . Alcohol Use: No     Comment: no alcohol since 2013    Review of Systems Constitutional: Negative for fever. Eyes: Negative for visual changes. ENT: Negative for sore throat. Cardiovascular: Negative for chest pain. Respiratory: Negative for shortness of breath. Gastrointestinal:  Positive for abdominal pain and vomitingary: Negative for dysuria. Musculoskeletal: Negative for back pain. Skin: Negative for rash. Neurological: Negative for headaches, focal weakness or numbness.  10-point ROS otherwise negative.  ____________________________________________   PHYSICAL EXAM:  VITAL SIGNS: ED Triage Vitals  Enc Vitals Group     BP 03/11/15 1458 111/80 mmHg     Pulse Rate 03/11/15 1458 108     Resp 03/11/15 1458 20     Temp 03/11/15 1458 98.4 F (36.9 C)     Temp Source 03/11/15 1458 Oral     SpO2 03/11/15 1455 96 %     Weight  03/11/15 1458 251 lb (113.853 kg)     Height 03/11/15 1458 5\' 8"  (1.727 m)     Head Cir --      Peak Flow --      Pain Score 03/11/15 1459 10     Pain Loc --      Pain Edu? --      Excl. in [place]? --     Constitutional: Alert and oriented. Well appearing and in no distress. Eyes: Conjunctivae are normal. PERRL. Normal extraocular movements. ENT   Head: Normocephalic and atraumatic.   Nose: No congestion/rhinnorhea.   Mouth/Throat: Mucous membranes are moist.   Neck: No stridor. Cardiovascular: Normal rate, regular rhythm. Normal and symmetric distal pulses are present in all extremities. No murmurs, rubs, or gallops. Respiratory: Normal respiratory effort without tachypnea nor retractions. Breath sounds are clear and equal bilaterally. No wheezes/rales/rhonchi. Gastrointestinal: Soft and nontender. No distention. No abdominal bruits.  Musculoskeletal: Nontender with normal range of motion in all extremities. No joint effusions.  No lower extremity tenderness nor edema. Neurologic:  Normal speech and language. No gross focal neurologic deficits are appreciated. Speech is normal. No gait instability. Skin:  Skin is warm, dry and intact. No rash noted. Psychiatric:  depressed mood and affect, patient tearful _______________________________  EKG: Interpreted bysinus tachycardia with rate 107 bpm, possible septal infarct age indeterminate, normal QS with, normal QT interval.____________________________________________  ED COURSE:  Pertinent labs & imaging results that were available during my care of the patient were reviewed by me and considered in my medical decision making (see chart for details).  she is in no acute distress, well known to me. There is no acute indication for any treatment at this time. She will be given oral medications for nausea.  ____________________________________________    FINAL ASSESSMENT AND PLAN  CHRONIC ABDOMINAL PAIN, CHRONIC PANCREATITIS,  CHRONIC ALCOHOL USE, SCHIZOPHRENIA  Plan: Patient does not have indication for further workup at this time. She'll receive oral medications and follow-up with her doctor.  Earleen Newport, MD   Earleen Newport, MD 03/11/15 [phone]

## 2015-03-11 NOTE — Discharge Instructions (Signed)

## 2015-03-13 DIAGNOSIS — N823 Fistula of vagina to large intestine: Secondary | ICD-10-CM | POA: Diagnosis not present

## 2015-03-13 DIAGNOSIS — J9811 Atelectasis: Secondary | ICD-10-CM | POA: Diagnosis not present

## 2015-03-13 DIAGNOSIS — G8929 Other chronic pain: Secondary | ICD-10-CM | POA: Diagnosis not present

## 2015-03-13 DIAGNOSIS — I129 Hypertensive chronic kidney disease with stage 1 through stage 4 chronic kidney disease, or unspecified chronic kidney disease: Secondary | ICD-10-CM | POA: Diagnosis not present

## 2015-03-13 DIAGNOSIS — D329 Benign neoplasm of meninges, unspecified: Secondary | ICD-10-CM | POA: Diagnosis not present

## 2015-03-13 DIAGNOSIS — D649 Anemia, unspecified: Secondary | ICD-10-CM | POA: Diagnosis not present

## 2015-03-13 DIAGNOSIS — K861 Other chronic pancreatitis: Secondary | ICD-10-CM | POA: Diagnosis not present

## 2015-03-13 DIAGNOSIS — Z8739 Personal history of other diseases of the musculoskeletal system and connective tissue: Secondary | ICD-10-CM | POA: Diagnosis not present

## 2015-03-13 DIAGNOSIS — Z8673 Personal history of transient ischemic attack (TIA), and cerebral infarction without residual deficits: Secondary | ICD-10-CM | POA: Diagnosis not present

## 2015-03-13 DIAGNOSIS — B379 Candidiasis, unspecified: Secondary | ICD-10-CM | POA: Diagnosis not present

## 2015-03-13 DIAGNOSIS — Z882 Allergy status to sulfonamides status: Secondary | ICD-10-CM | POA: Diagnosis not present

## 2015-03-13 DIAGNOSIS — R9431 Abnormal electrocardiogram [ECG] [EKG]: Secondary | ICD-10-CM | POA: Diagnosis not present

## 2015-03-13 DIAGNOSIS — F418 Other specified anxiety disorders: Secondary | ICD-10-CM | POA: Diagnosis not present

## 2015-03-13 DIAGNOSIS — N390 Urinary tract infection, site not specified: Secondary | ICD-10-CM | POA: Diagnosis not present

## 2015-03-13 DIAGNOSIS — F17210 Nicotine dependence, cigarettes, uncomplicated: Secondary | ICD-10-CM | POA: Diagnosis not present

## 2015-03-13 DIAGNOSIS — Z880 Allergy status to penicillin: Secondary | ICD-10-CM | POA: Diagnosis not present

## 2015-03-13 DIAGNOSIS — R05 Cough: Secondary | ICD-10-CM | POA: Diagnosis not present

## 2015-03-13 DIAGNOSIS — K852 Alcohol induced acute pancreatitis without necrosis or infection: Secondary | ICD-10-CM | POA: Insufficient documentation

## 2015-03-13 DIAGNOSIS — Z716 Tobacco abuse counseling: Secondary | ICD-10-CM | POA: Diagnosis not present

## 2015-03-13 DIAGNOSIS — I4581 Long QT syndrome: Secondary | ICD-10-CM | POA: Diagnosis not present

## 2015-03-13 DIAGNOSIS — F209 Schizophrenia, unspecified: Secondary | ICD-10-CM | POA: Diagnosis not present

## 2015-03-13 DIAGNOSIS — B1920 Unspecified viral hepatitis C without hepatic coma: Secondary | ICD-10-CM | POA: Diagnosis not present

## 2015-03-13 DIAGNOSIS — J449 Chronic obstructive pulmonary disease, unspecified: Secondary | ICD-10-CM | POA: Diagnosis not present

## 2015-03-13 DIAGNOSIS — M810 Age-related osteoporosis without current pathological fracture: Secondary | ICD-10-CM | POA: Diagnosis not present

## 2015-03-13 DIAGNOSIS — F419 Anxiety disorder, unspecified: Secondary | ICD-10-CM | POA: Diagnosis not present

## 2015-03-13 DIAGNOSIS — G40909 Epilepsy, unspecified, not intractable, without status epilepticus: Secondary | ICD-10-CM | POA: Diagnosis not present

## 2015-03-13 DIAGNOSIS — N189 Chronic kidney disease, unspecified: Secondary | ICD-10-CM | POA: Diagnosis not present

## 2015-03-13 DIAGNOSIS — E1122 Type 2 diabetes mellitus with diabetic chronic kidney disease: Secondary | ICD-10-CM | POA: Diagnosis not present

## 2015-03-13 DIAGNOSIS — F1010 Alcohol abuse, uncomplicated: Secondary | ICD-10-CM | POA: Diagnosis not present

## 2015-03-13 DIAGNOSIS — F329 Major depressive disorder, single episode, unspecified: Secondary | ICD-10-CM | POA: Diagnosis not present

## 2015-03-28 DIAGNOSIS — G894 Chronic pain syndrome: Secondary | ICD-10-CM | POA: Diagnosis not present

## 2015-03-28 DIAGNOSIS — R109 Unspecified abdominal pain: Secondary | ICD-10-CM | POA: Diagnosis not present

## 2015-03-28 DIAGNOSIS — G40409 Other generalized epilepsy and epileptic syndromes, not intractable, without status epilepticus: Secondary | ICD-10-CM | POA: Diagnosis not present

## 2015-03-30 IMAGING — CT CT ABD-PELV W/O CM
1 of 2 series · 15 of 32 positions shown, 19 images · non-contrast
Comparison: none

REASON FOR EXAM: (1) abdominal pain, hypotension; (2) abdominal pain,
hypotension
COMMENTS:

PROCEDURE:     CT  - CT ABDOMEN AND PELVIS W[DATE]  [DATE]
RESULT:     History: Pain. Hypotension.
Comparison Study: CT 12/23/2012.

[Series 2: 3mm soft tissue · axial · 1.27mm/px · z∈[-518,-56]mm · 15 of 168 slices shown, 19 images]
[im 7/168  soft-tissue]
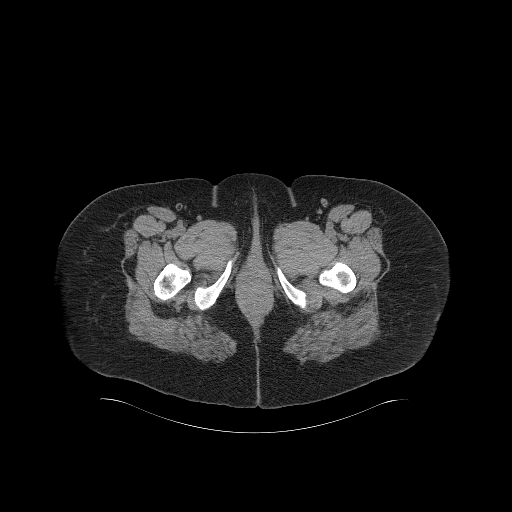
[im 7/168  bone]
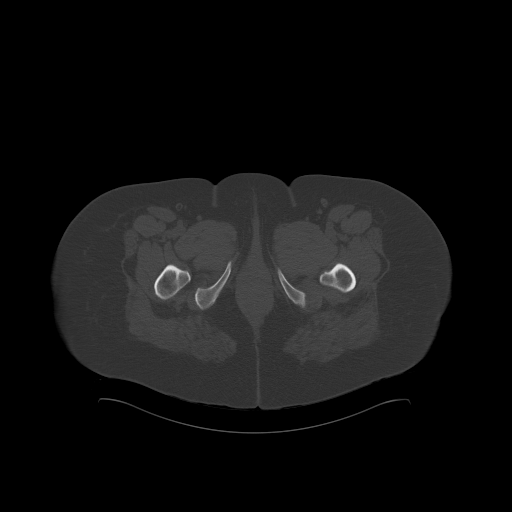
[im 21/168  soft-tissue]
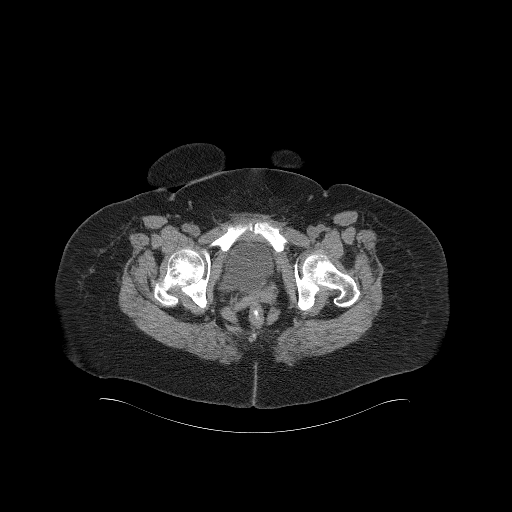
[im 35/168  soft-tissue]
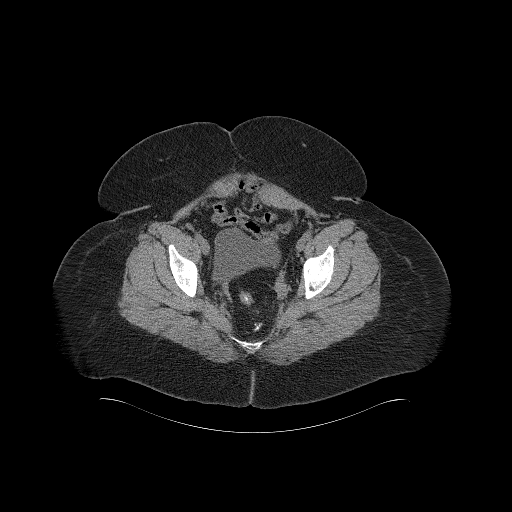
[im 49/168  soft-tissue]
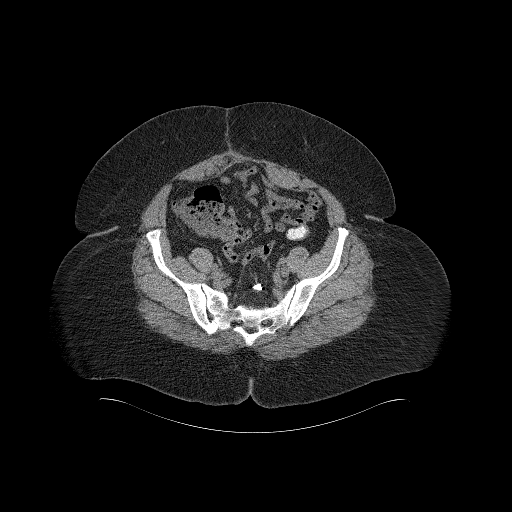
[im 56/168  soft-tissue]
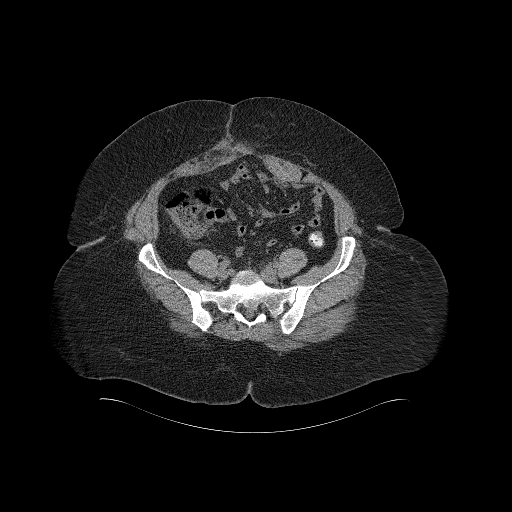
[im 70/168  soft-tissue]
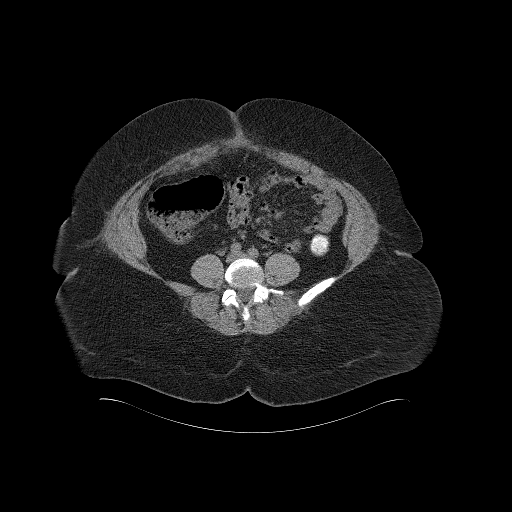
[im 84/168  soft-tissue]
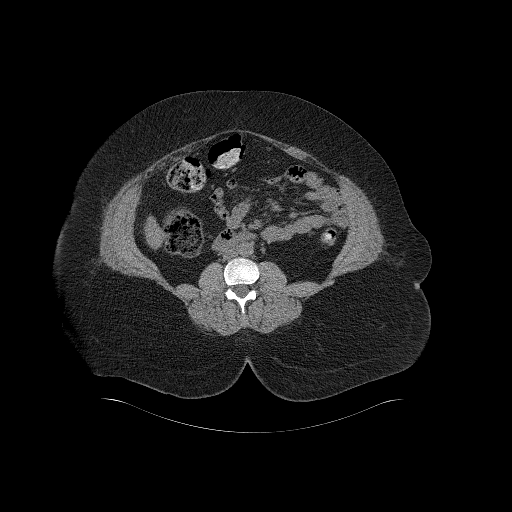
[im 98/168  soft-tissue]
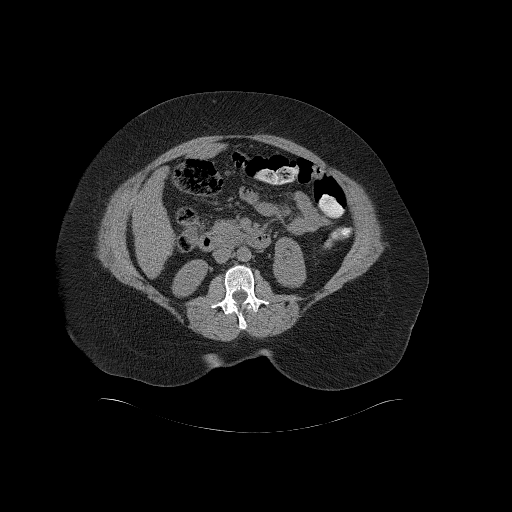
[im 112/168  soft-tissue]
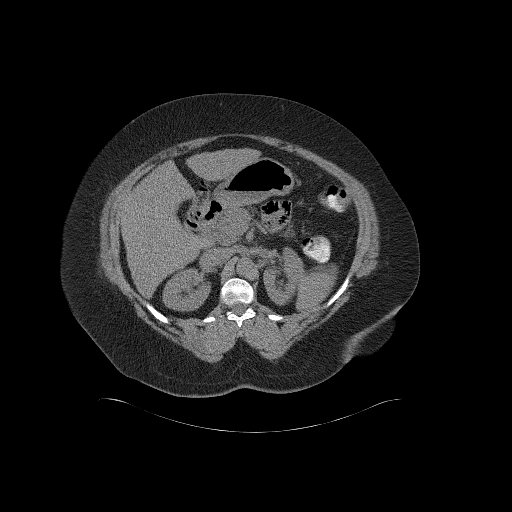
[im 112/168  bone]
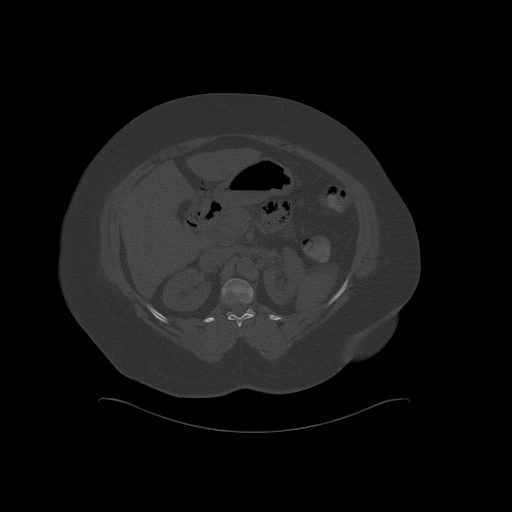
[im 119/168  soft-tissue]
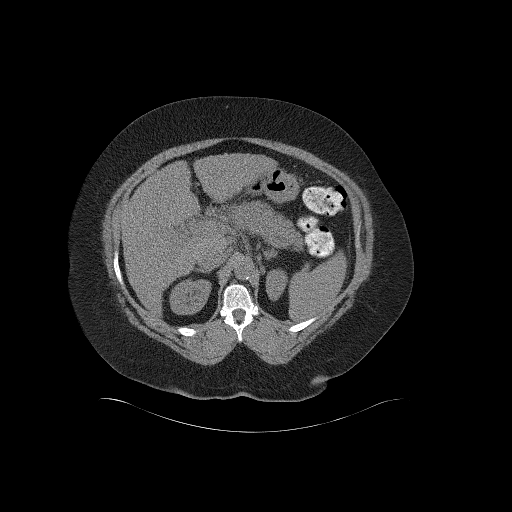
[im 133/168  soft-tissue]
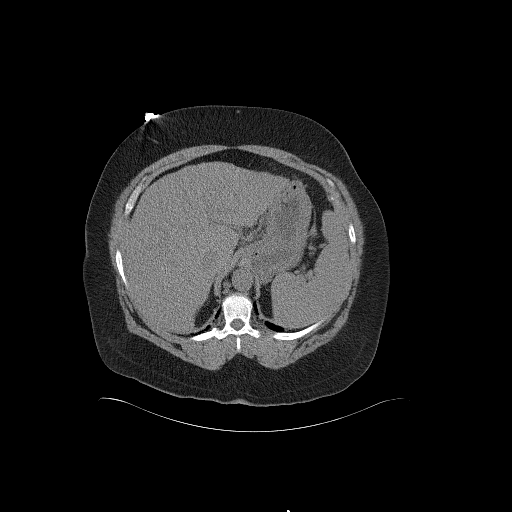
[im 140/168  lung]
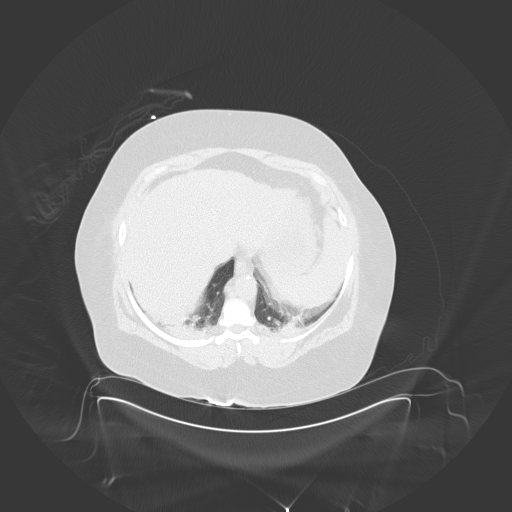
[im 147/168  soft-tissue]
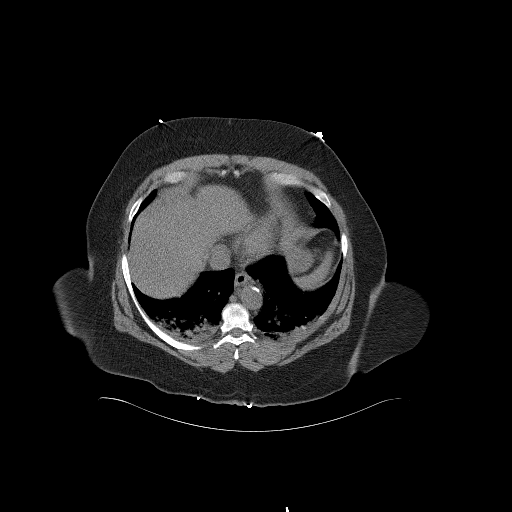
[im 147/168  lung]
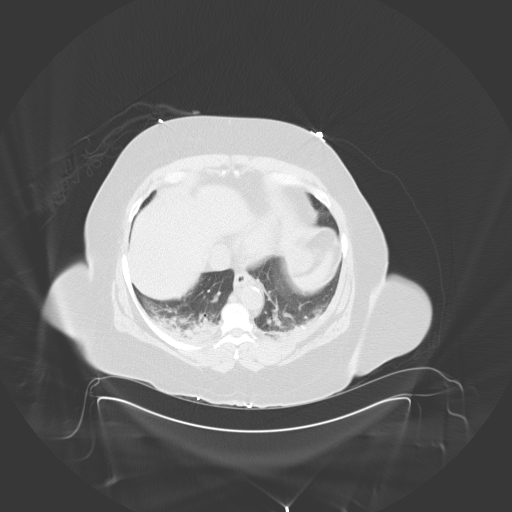
[im 154/168  lung]
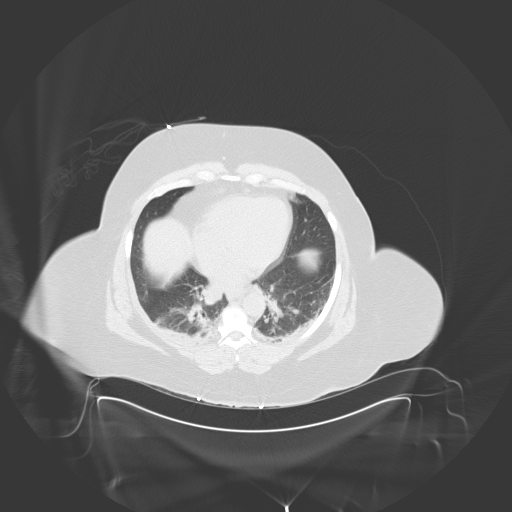
[im 161/168  soft-tissue]
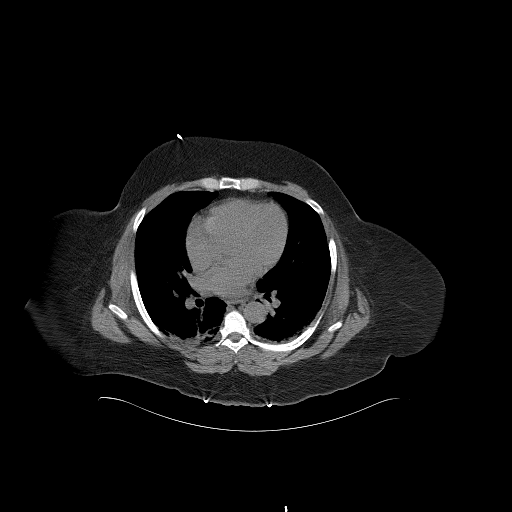
[im 161/168  lung]
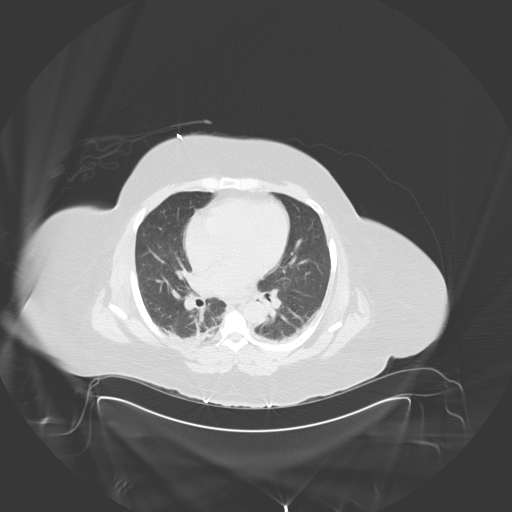

[15 of 32 positions shown; findings below may reference images not displayed]

FINDINGS: Standard nonenhanced CT obtained. Liver is slightly nodular.
Cirrhosis cannot be excluded. Splenomegaly is noted. Cholecystectomy.
Pancreas is slightly edematous suggesting pancreatitis. No biliary
obstruction.

Stable 1.8 cm left adrenal nodule most likely adrenal adenoma. Kdneys are
normal. No hydronephrosis or obstructing ureteral stone. Bladder is
nondistended. No bowel distention. No inflammatory change in right lower
quadrant to suggest appendicitis. No inflammatory change left lower
quadrant. No free air.

Aorta normal caliber. No significant adenopathy.

Prominent atelectasis lung bases. No acute bony abnormality.
IMPRESSION: 1. Findings suggesting pancreatitis.
2. Changes suggesting cirrhosis with possible portal hypertension.
3. Stable small left adrenal nodule  most consistent adrenal adenoma.

## 2015-03-30 IMAGING — CR DG CHEST 1V PORT
1 series · 1 of 1 positions shown · non-contrast
Comparison: none

REASON FOR EXAM: cough
COMMENTS:

[ap]
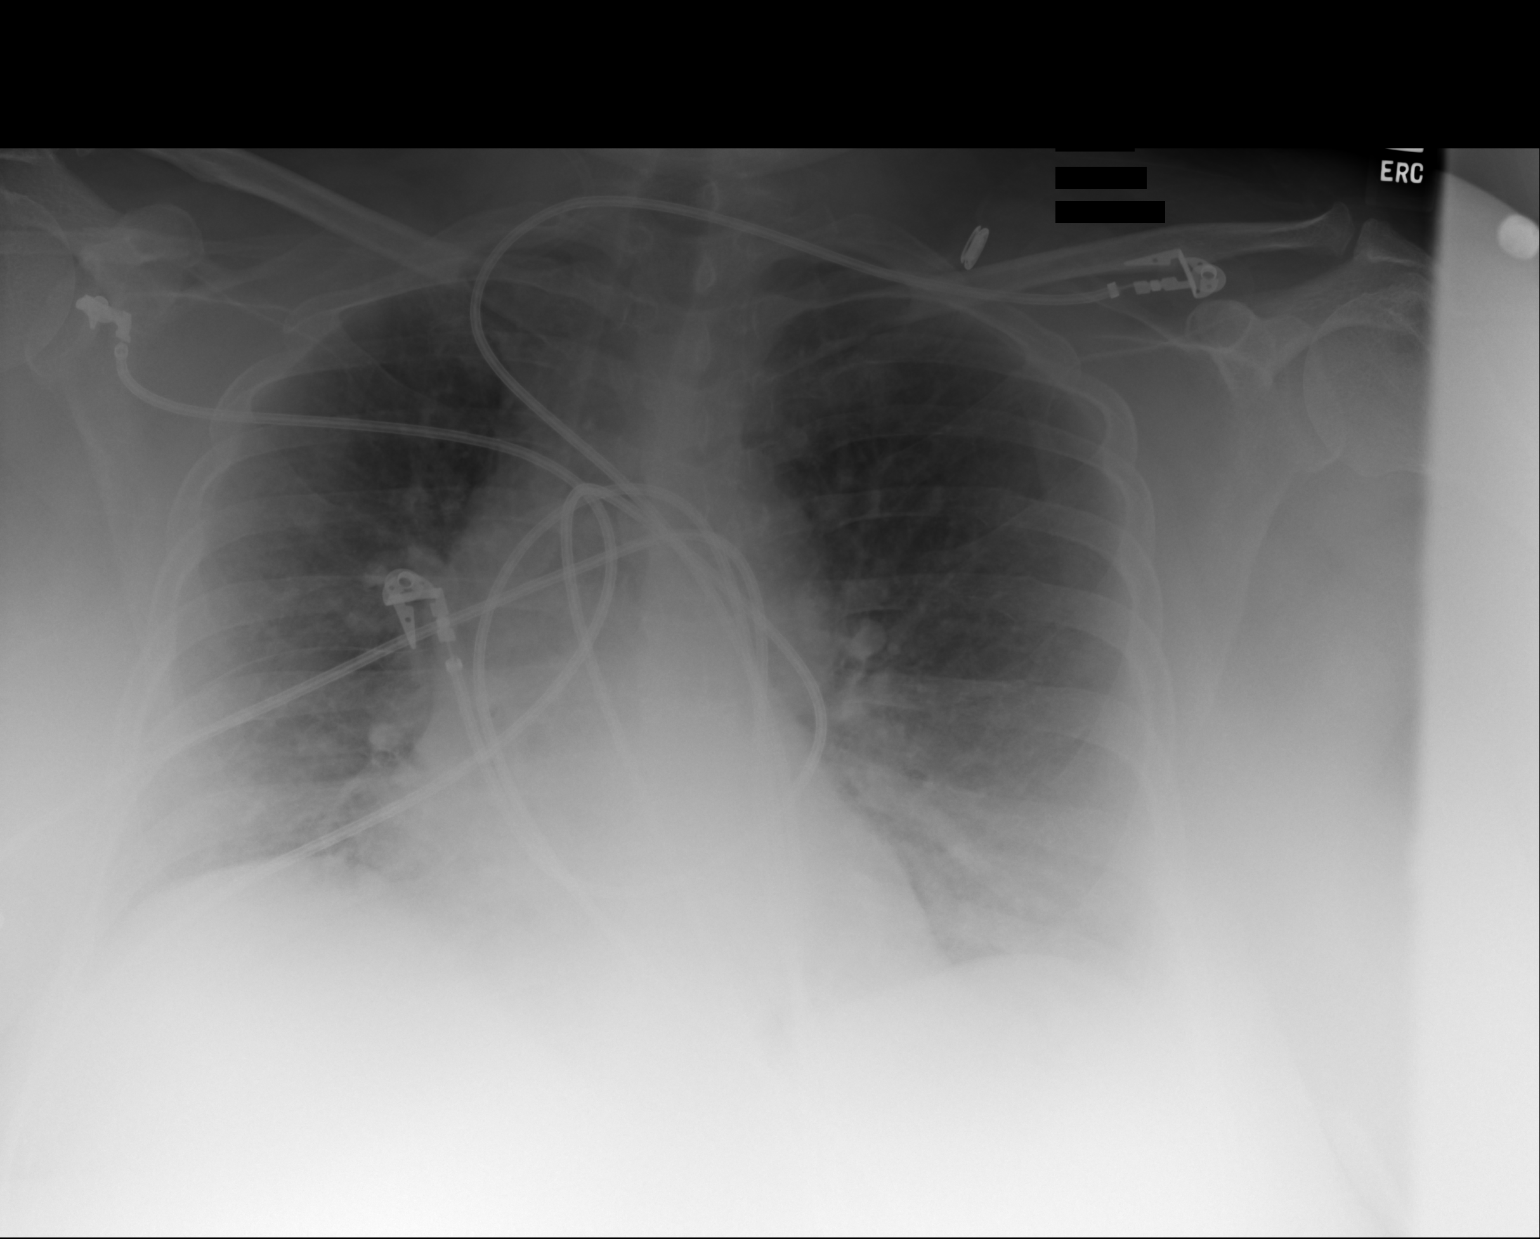

[1 of 1 positions shown; findings below may reference images not displayed]

PROCEDURE:     DXR - DXR PORTABLE CHEST SINGLE VIEW  - April 19, 2013  [DATE]

RESULT:

Comparison is made to a prior study dated 03/10/2013.

The patient has taken a shallow inspiration. With technique taken into
consideration, there does not appear to be evidence of focal infiltrates,
effusions or edema. The cardiac silhouette is within normal limits. The
visualized bony skeleton is unremarkable.
IMPRESSION: Shallow inspiration without evidence of acute
cardiopulmonary disease. This study is degraded by technique and patient
body habitus. Repeat two view evaluation is recommended.

## 2015-03-30 IMAGING — CR DG CHEST 1V PORT
1 series · 1 of 1 positions shown · non-contrast
Comparison: none

REASON FOR EXAM: central line placement
COMMENTS:

PROCEDURE:     DXR - DXR PORTABLE CHEST SINGLE VIEW  - April 19, 2013  [DATE]
RESULT:
Comparison is made to a prior study of same date, earlier time.

[ap]
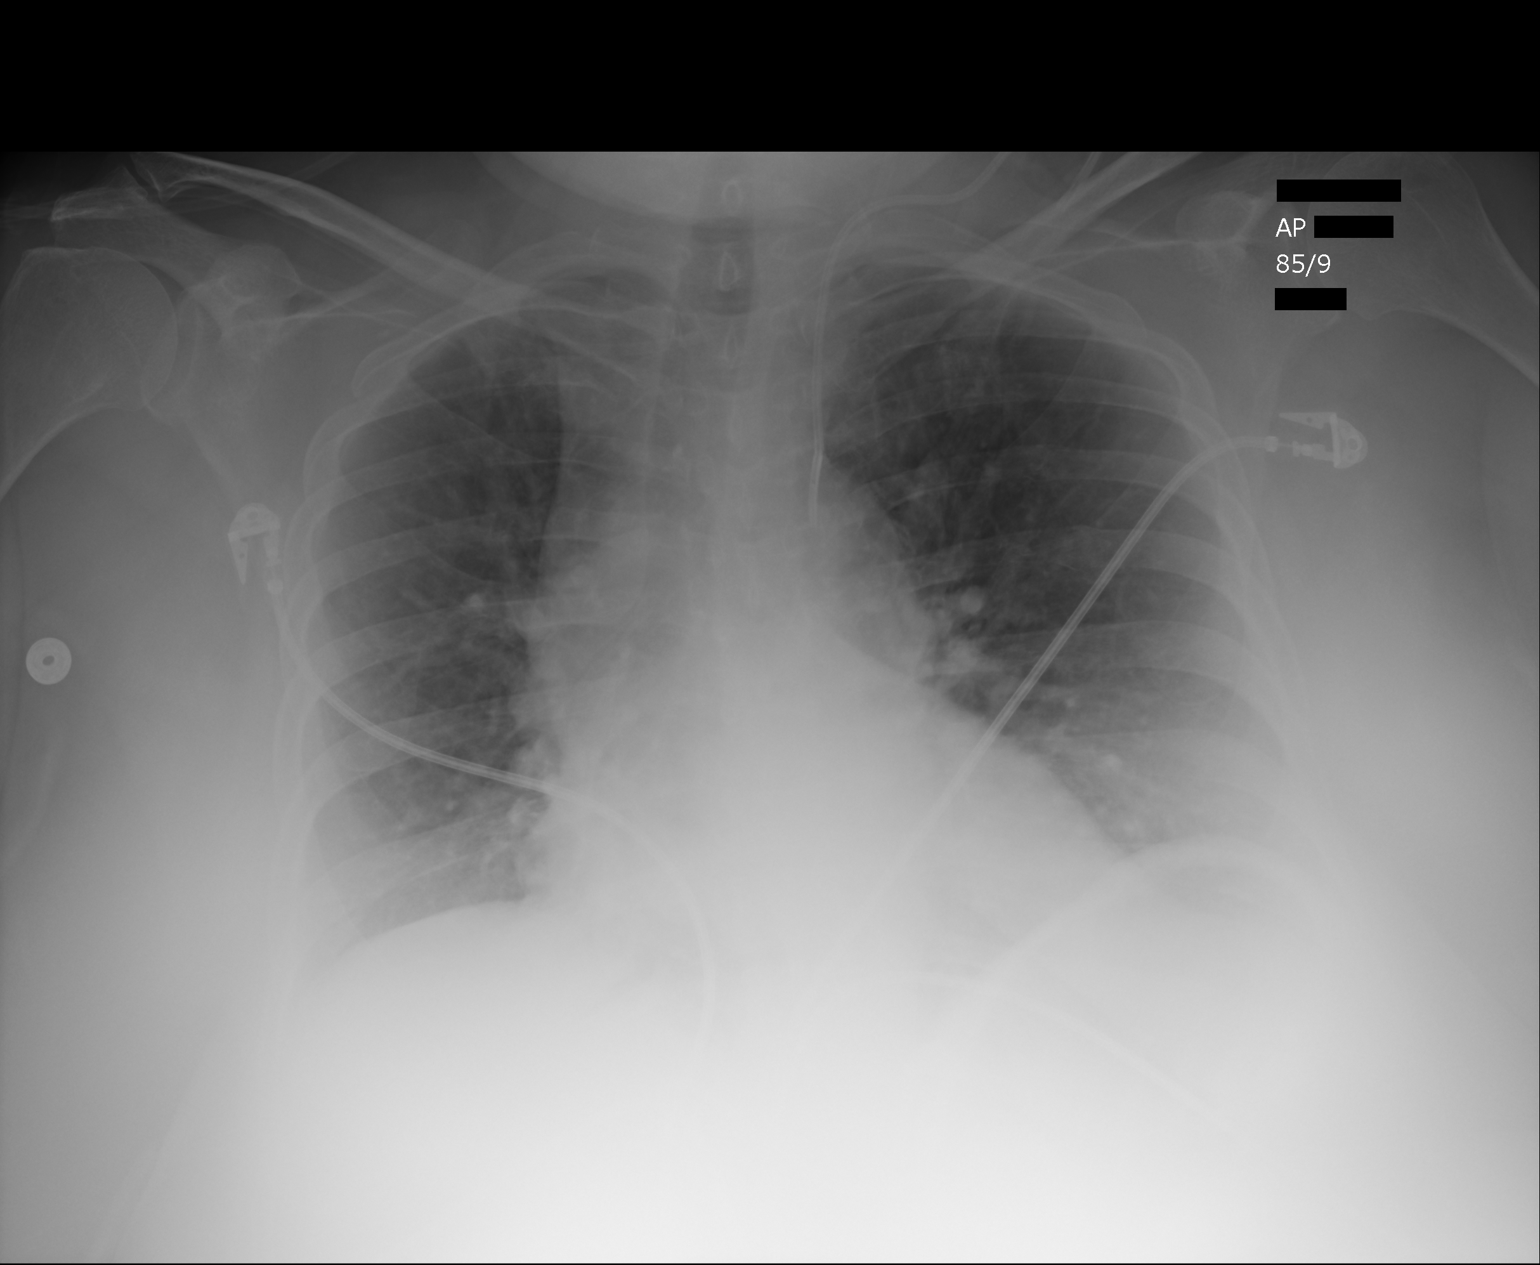

[1 of 1 positions shown; findings below may reference images not displayed]

FINDINGS: The patient has taken a shallow inspiration. A left sided central
venous catheter is appreciated with the tip projecting in the region of the
aortic arch. Reevaluation of catheter positioning is recommended. There is
no evidence of a pneumothorax. The cardiac silhouette is unremarkable. The
visualized osseous structures are unremarkable.
IMPRESSION: Reevaluation of the left sided central venous catheter is
recommended as described above, and if clinically appropriate, repositioning
and/or reinsertion recommended. Dzemaili, the patient's floor nurse was
informed of these findings at the time of the interpretation, to relay to
the patient's attending physician.

## 2015-03-31 IMAGING — CT CT CHEST W/ CM
1 series · 16 of 31 positions shown, 20 images · IV contrast (agent unspecified)
Comparison: none

REASON FOR EXAM: evaluate hemoptisis.
COMMENTS:

PROCEDURE:     CT  - CT CHEST WITH CONTRAST  - April 20, 2013 [DATE]
RESULT:     History: Hemoptysis.
Comparison Study: CT 09/22/2012.

[Series 2: soft tissue · axial · 0.77mm/px · z∈[-344,-86]mm · 16 of 94 slices shown, 20 images]
[im 4/94  mediastinal]
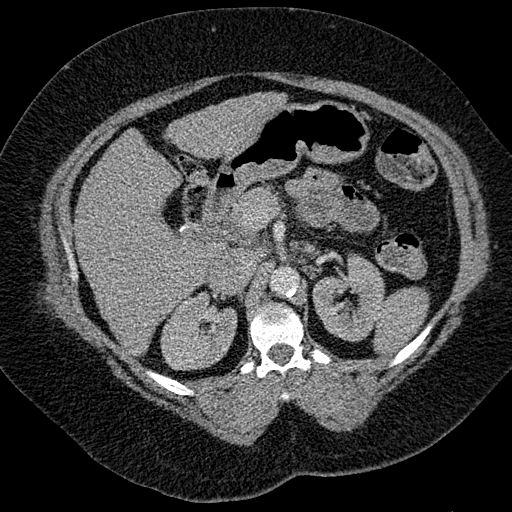
[im 4/94  lung]
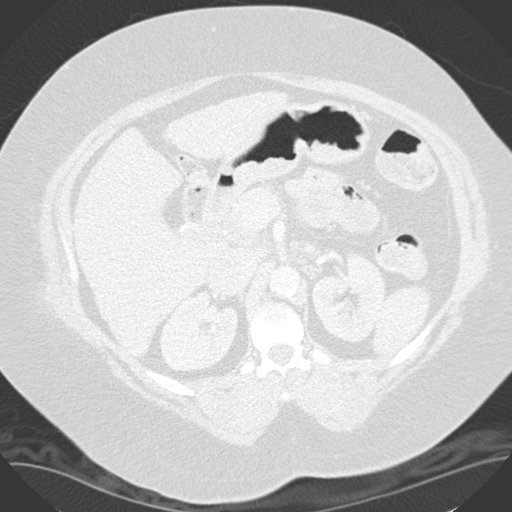
[im 11/94  lung]
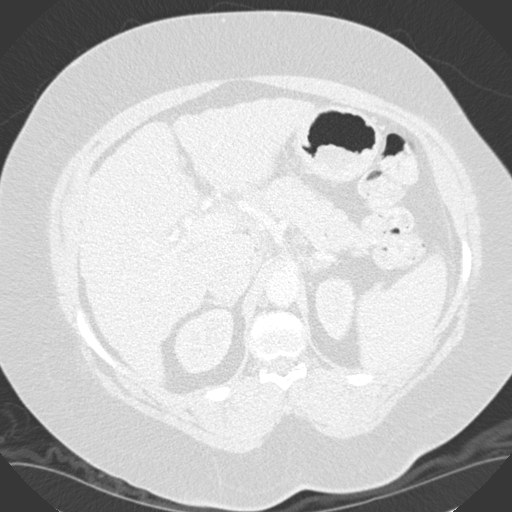
[im 18/94  lung]
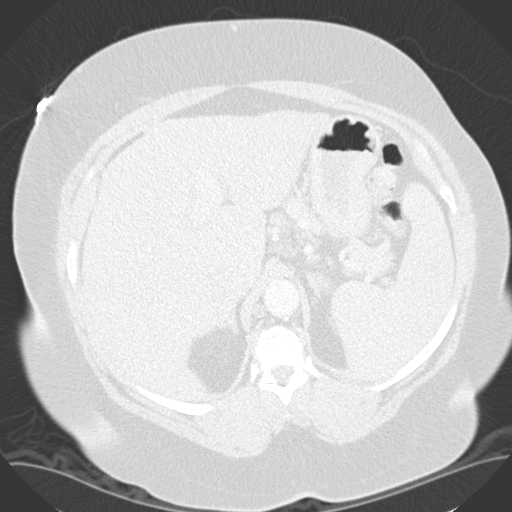
[im 21/94  lung]
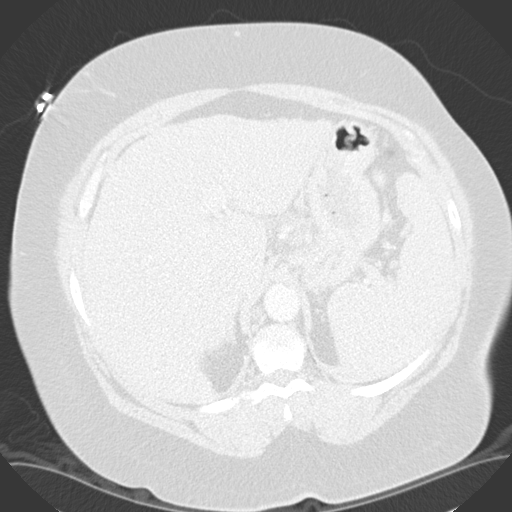
[im 28/94  mediastinal]
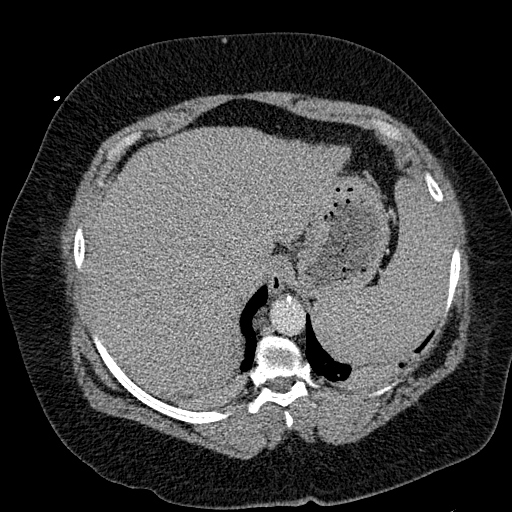
[im 28/94  lung]
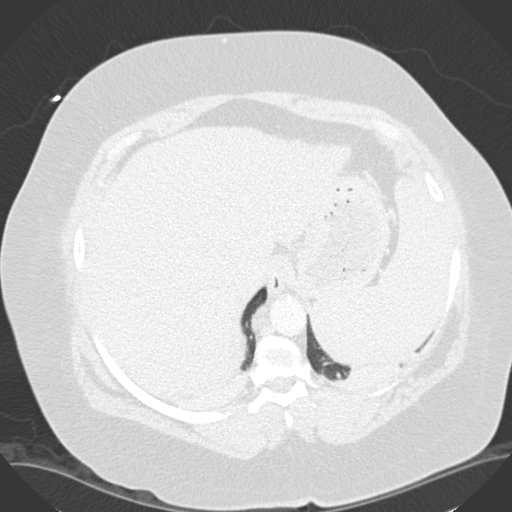
[im 32/94  lung]
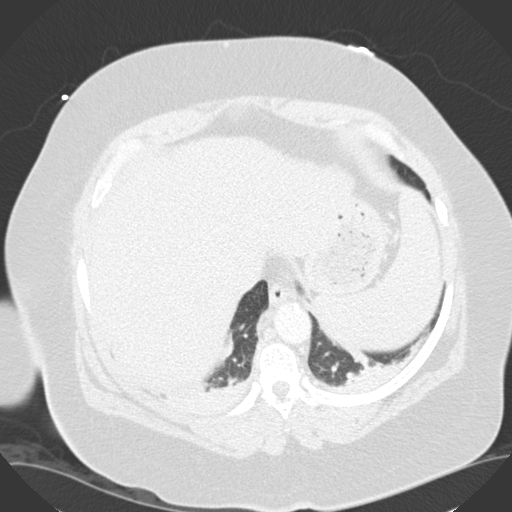
[im 38/94  lung]
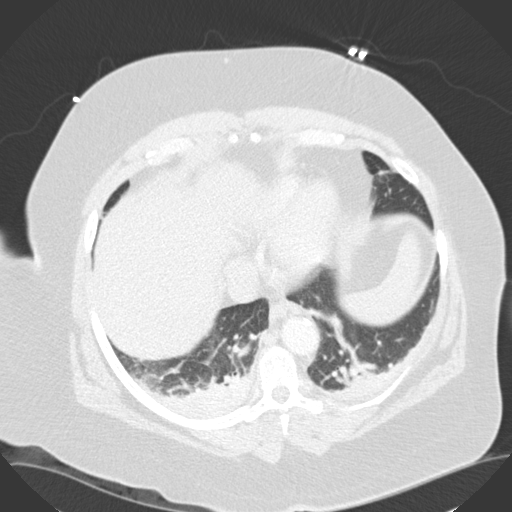
[im 45/94  lung]
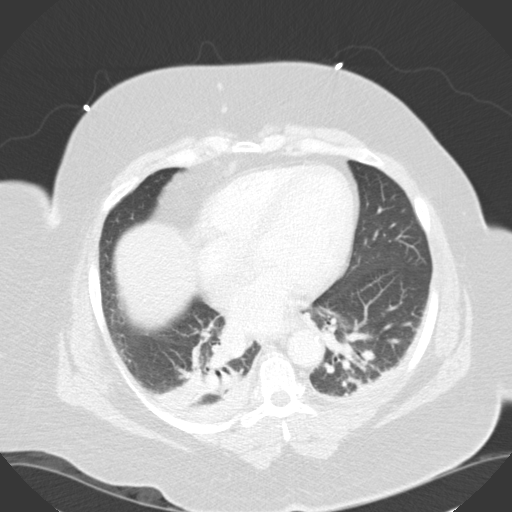
[im 50/94  mediastinal]
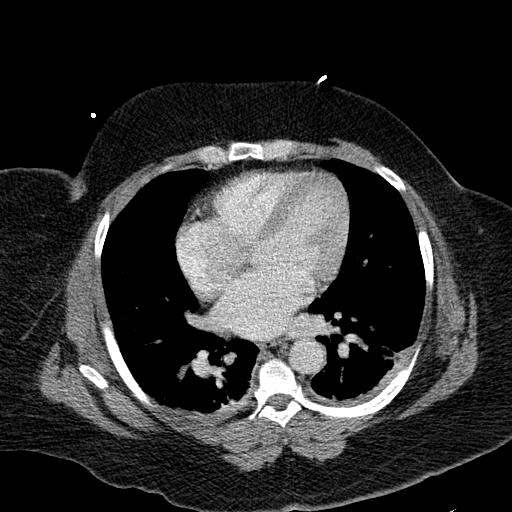
[im 50/94  lung]
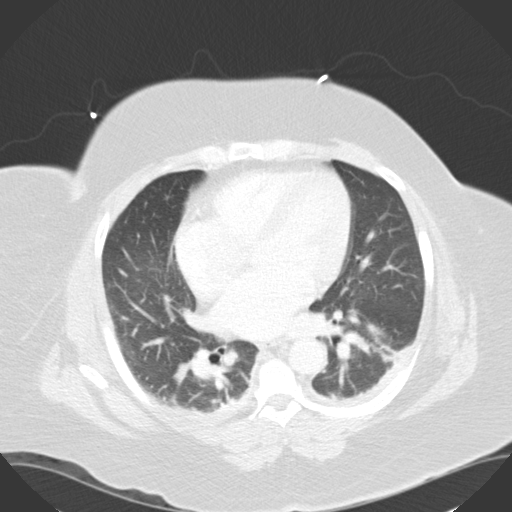
[im 56/94  lung]
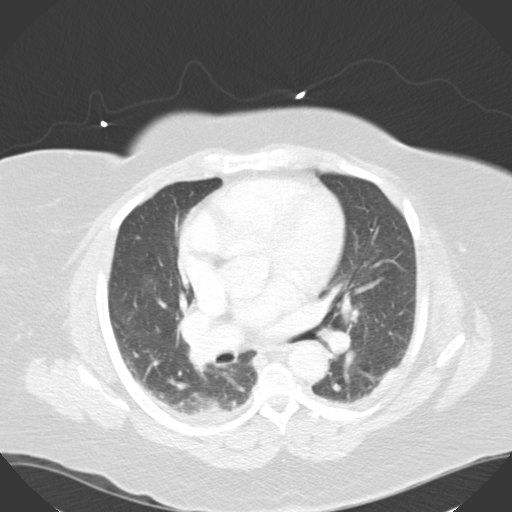
[im 63/94  lung]
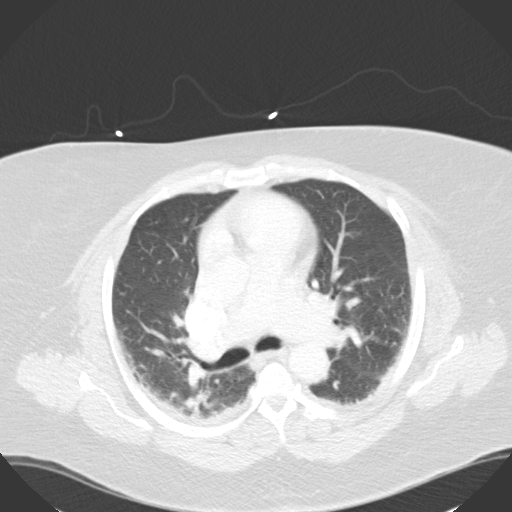
[im 66/94  lung]
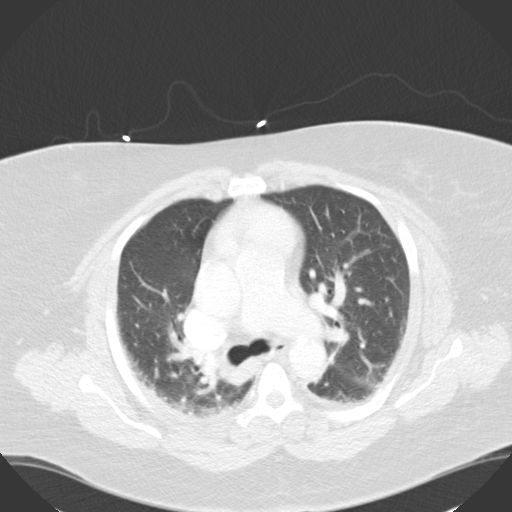
[im 73/94  mediastinal]
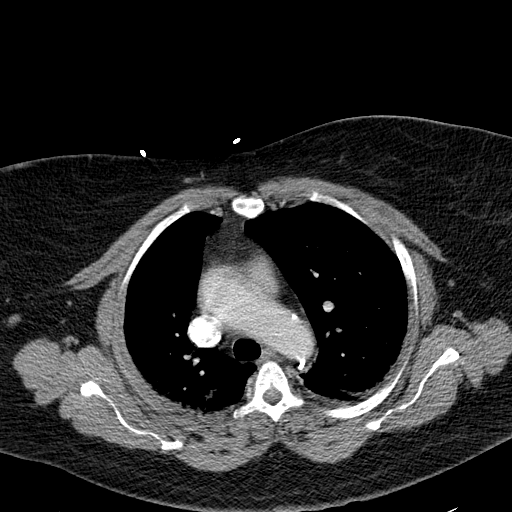
[im 73/94  lung]
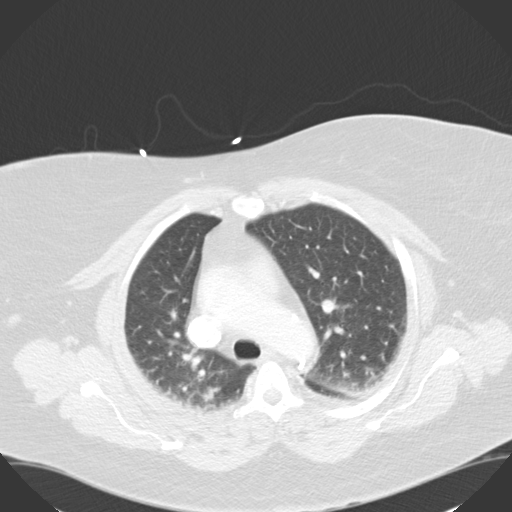
[im 76/94  lung]
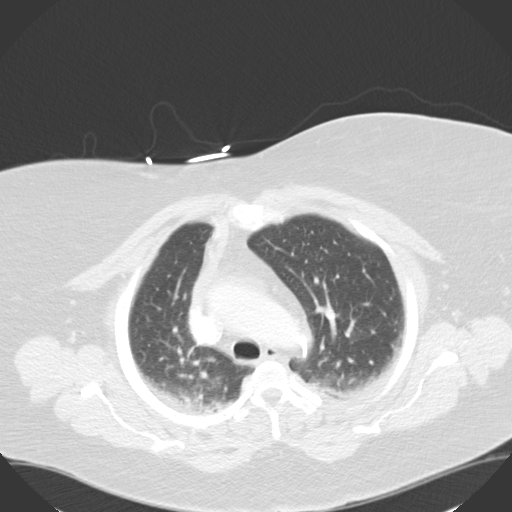
[im 83/94  lung]
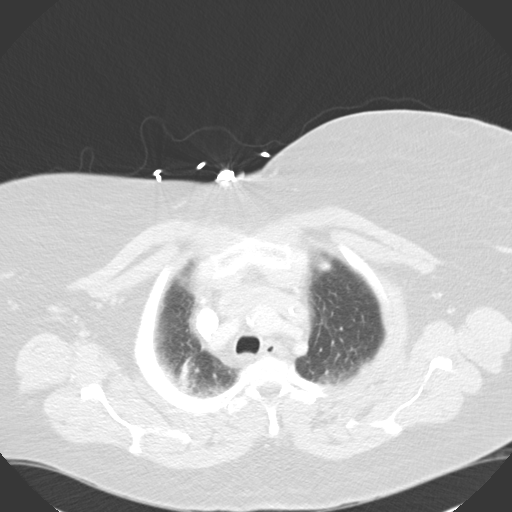
[im 90/94  lung]
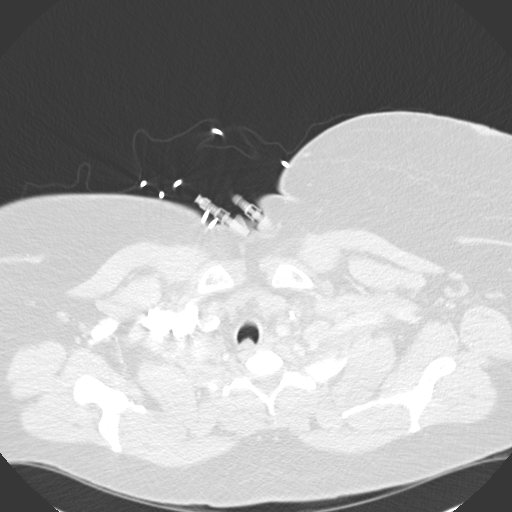

[16 of 31 positions shown; findings below may reference images not displayed]

FINDINGS: Contrast and CT obtained 75 cc of [id number]. Motion artifact
again noted in the a standing thoracic aorta. Aorta is otherwise
unremarkable. No pulmonary embolus identified. Opacification of the basilar
pulmonary arteries is limited. Large airways are patent. Prominent
atelectasis and infiltrates in the lung base with  pleural thickening. These
findings are most consistent pneumonia.

Stable 1.8 cm left adrenal nodule is noted .Statistically this most likely
versus an adrenal adenoma. Cholecystectomy.
IMPRESSION: Prominent bibasilar pneumonia.

## 2015-04-05 DIAGNOSIS — K861 Other chronic pancreatitis: Secondary | ICD-10-CM | POA: Diagnosis not present

## 2015-04-05 DIAGNOSIS — M810 Age-related osteoporosis without current pathological fracture: Secondary | ICD-10-CM | POA: Diagnosis not present

## 2015-04-05 DIAGNOSIS — E119 Type 2 diabetes mellitus without complications: Secondary | ICD-10-CM | POA: Diagnosis not present

## 2015-04-05 DIAGNOSIS — J439 Emphysema, unspecified: Secondary | ICD-10-CM | POA: Diagnosis not present

## 2015-04-05 DIAGNOSIS — N189 Chronic kidney disease, unspecified: Secondary | ICD-10-CM | POA: Diagnosis not present

## 2015-04-05 DIAGNOSIS — Z8673 Personal history of transient ischemic attack (TIA), and cerebral infarction without residual deficits: Secondary | ICD-10-CM | POA: Diagnosis not present

## 2015-04-05 DIAGNOSIS — N823 Fistula of vagina to large intestine: Secondary | ICD-10-CM | POA: Diagnosis not present

## 2015-04-05 DIAGNOSIS — I129 Hypertensive chronic kidney disease with stage 1 through stage 4 chronic kidney disease, or unspecified chronic kidney disease: Secondary | ICD-10-CM | POA: Diagnosis not present

## 2015-04-17 ENCOUNTER — Encounter: Payer: Self-pay | Admitting: *Deleted

## 2015-04-17 ENCOUNTER — Emergency Department: Payer: Medicare Other

## 2015-04-17 ENCOUNTER — Emergency Department
Admission: EM | Admit: 2015-04-17 | Discharge: 2015-04-18 | Disposition: A | Discharge status: Home / Self Care | Payer: Medicare Other | Attending: Emergency Medicine | Admitting: Emergency Medicine

## 2015-04-17 DIAGNOSIS — Z880 Allergy status to penicillin: Secondary | ICD-10-CM | POA: Insufficient documentation

## 2015-04-17 DIAGNOSIS — N189 Chronic kidney disease, unspecified: Secondary | ICD-10-CM | POA: Insufficient documentation

## 2015-04-17 DIAGNOSIS — J159 Unspecified bacterial pneumonia: Secondary | ICD-10-CM | POA: Insufficient documentation

## 2015-04-17 DIAGNOSIS — Z720 Tobacco use: Secondary | ICD-10-CM | POA: Diagnosis not present

## 2015-04-17 DIAGNOSIS — R112 Nausea with vomiting, unspecified: Secondary | ICD-10-CM

## 2015-04-17 DIAGNOSIS — E119 Type 2 diabetes mellitus without complications: Secondary | ICD-10-CM | POA: Diagnosis not present

## 2015-04-17 DIAGNOSIS — Z79899 Other long term (current) drug therapy: Secondary | ICD-10-CM | POA: Diagnosis not present

## 2015-04-17 DIAGNOSIS — R0602 Shortness of breath: Secondary | ICD-10-CM | POA: Diagnosis not present

## 2015-04-17 DIAGNOSIS — R1010 Upper abdominal pain, unspecified: Secondary | ICD-10-CM

## 2015-04-17 DIAGNOSIS — R1110 Vomiting, unspecified: Secondary | ICD-10-CM | POA: Diagnosis not present

## 2015-04-17 DIAGNOSIS — Z7952 Long term (current) use of systemic steroids: Secondary | ICD-10-CM | POA: Insufficient documentation

## 2015-04-17 DIAGNOSIS — I129 Hypertensive chronic kidney disease with stage 1 through stage 4 chronic kidney disease, or unspecified chronic kidney disease: Secondary | ICD-10-CM | POA: Diagnosis not present

## 2015-04-17 DIAGNOSIS — Z792 Long term (current) use of antibiotics: Secondary | ICD-10-CM | POA: Insufficient documentation

## 2015-04-17 DIAGNOSIS — K861 Other chronic pancreatitis: Secondary | ICD-10-CM

## 2015-04-17 DIAGNOSIS — R05 Cough: Secondary | ICD-10-CM | POA: Diagnosis not present

## 2015-04-17 DIAGNOSIS — J189 Pneumonia, unspecified organism: Secondary | ICD-10-CM | POA: Diagnosis not present

## 2015-04-17 MED ORDER — SODIUM CHLORIDE 0.9 % IV BOLUS (SEPSIS)
1000.0000 mL | Freq: Once | INTRAVENOUS | Status: AC
  Administered 2015-04-18: 1000 mL via INTRAVENOUS

## 2015-04-17 MED ORDER — METOCLOPRAMIDE HCL 5 MG/ML IJ SOLN
10.0000 mg | Freq: Once | INTRAMUSCULAR | Status: AC
  Administered 2015-04-18: 10 mg via INTRAVENOUS
  Filled 2015-04-17: qty 2

## 2015-04-17 NOTE — ED Notes (Signed)
Pt upset about coming to the ER every couple days. Requested Chaplain visit. When the Chaplain arrived at her bedside, she changed her mind and said she no longer needs his service.

## 2015-04-17 NOTE — Progress Notes (Signed)
   04/17/15 2300  Clinical Encounter Type  Visited With Patient  Visit Type Initial  Referral From Nurse  Spiritual Encounters  Spiritual Needs Emotional  Stress Factors  Patient Stress Factors Health changes  Family Stress Factors Health changes  Chaplain engaged patient about her health concerns. Patient expressed her challenges. Chaplain offered support and prayers.

## 2015-04-17 NOTE — ED Notes (Signed)
Pt c/o N/V all day, ate some dinner, then vomited, red appearing blood like , pt took photo with her phone.

## 2015-04-18 ENCOUNTER — Other Ambulatory Visit: Payer: Self-pay

## 2015-04-18 DIAGNOSIS — K861 Other chronic pancreatitis: Secondary | ICD-10-CM | POA: Diagnosis not present

## 2015-04-18 LAB — COMPREHENSIVE METABOLIC PANEL
ALK PHOS: 156 U/L — AB (ref 38–126)
ALT: 18 U/L (ref 14–54)
ANION GAP: 10 (ref 5–15)
AST: 45 U/L — ABNORMAL HIGH (ref 15–41)
Albumin: 3.2 g/dL — ABNORMAL LOW (ref 3.5–5.0)
BILIRUBIN TOTAL: 0.7 mg/dL (ref 0.3–1.2)
BUN: 6 mg/dL (ref 6–20)
CALCIUM: 8.5 mg/dL — AB (ref 8.9–10.3)
CO2: 24 mmol/L (ref 22–32)
CREATININE: 0.83 mg/dL (ref 0.44–1.00)
Chloride: 106 mmol/L (ref 101–111)
GFR calc non Af Amer: >60 mL/min (ref >60)
GLUCOSE: 97 mg/dL (ref 65–99)
Potassium: 3.1 mmol/L — ABNORMAL LOW (ref 3.5–5.1)
Sodium: 140 mmol/L (ref 135–145)
TOTAL PROTEIN: 7.2 g/dL (ref 6.5–8.1)

## 2015-04-18 LAB — URINALYSIS COMPLETE WITH MICROSCOPIC (ARMC ONLY)
Bilirubin Urine: NEGATIVE
GLUCOSE, UA: NEGATIVE mg/dL
Hgb urine dipstick: NEGATIVE
Ketones, ur: NEGATIVE mg/dL
NITRITE: POSITIVE — AB
PROTEIN: NEGATIVE mg/dL
SPECIFIC GRAVITY, URINE: 1.009 (ref 1.005–1.030)
pH: 6 (ref 5.0–8.0)

## 2015-04-18 LAB — CBC
HCT: 31.3 % — ABNORMAL LOW (ref 35.0–47.0)
Hemoglobin: 9.7 g/dL — ABNORMAL LOW (ref 12.0–16.0)
MCH: 24 pg — ABNORMAL LOW (ref 26.0–34.0)
MCHC: 31 g/dL — ABNORMAL LOW (ref 32.0–36.0)
MCV: 77.5 fL — ABNORMAL LOW (ref 80.0–100.0)
Platelets: 135 10*3/uL — ABNORMAL LOW (ref 150–440)
RBC: 4.03 MIL/uL (ref 3.80–5.20)
RDW: 19.7 % — ABNORMAL HIGH (ref 11.5–14.5)
WBC: 6.2 10*3/uL (ref 3.6–11.0)

## 2015-04-18 LAB — LIPASE, BLOOD: Lipase: 29 U/L (ref 22–51)

## 2015-04-18 MED ORDER — GI COCKTAIL ~~LOC~~
30.0000 mL | Freq: Once | ORAL | Status: AC
  Administered 2015-04-18: 30 mL via ORAL
  Filled 2015-04-18: qty 30

## 2015-04-18 MED ORDER — MAGNESIUM SULFATE 2 GM/50ML IV SOLN
2.0000 g | Freq: Once | INTRAVENOUS | Status: AC
  Administered 2015-04-18: 2 g via INTRAVENOUS
  Filled 2015-04-18: qty 50

## 2015-04-18 MED ORDER — LEVOFLOXACIN 750 MG PO TABS
750.0000 mg | ORAL_TABLET | Freq: Once | ORAL | Status: AC
  Administered 2015-04-18: 750 mg via ORAL
  Filled 2015-04-18: qty 1

## 2015-04-18 MED ORDER — LEVOFLOXACIN 750 MG PO TABS: 750.0000 mg | ORAL_TABLET | Freq: Every day | ORAL | Status: AC

## 2015-04-18 MED ORDER — METOCLOPRAMIDE HCL 10 MG PO TABS: 10.0000 mg | ORAL_TABLET | Freq: Three times a day (TID) | ORAL | Status: DC

## 2015-04-18 NOTE — ED Notes (Signed)
Pt and significant other states they are waiting to be discharged.

## 2015-04-18 NOTE — ED Provider Notes (Signed)
[facility] Emergency Department Provider Note  ____________________________________________  Time seen: Approximately 2329 PM  I have reviewed the triage vital signs and the nursing notes.   HISTORY  Chief Complaint Nausea and GI Problem    HPI [name] is a 59 y.o. female who comes into the hospital with 2 days of nausea vomiting and some hematemesis. The patient reports is also been having some upper abdominal pain which she rates a 9 out of 10 in intensity. The patient reports that the pain is sharp and going to her back. The patient has a history of a swollen pancreas, liver and spleen as well as a history of cirrhosis. The patient also has a history of chronic pancreatitis. The patient reports that whenever she has these flares she is admitted to the hospital and was admitted to [facility] 2 weeks ago. The patient reports she called her physician today and was told to come into the hospital at [facility] but she reports she did not have a ride so was told to come here. The patient reports that the pain is worse in her right upper quadrant. She reports that she has not drank in 5 years although a previous visit reports that she's drank as recently as 2 months ago. The patient reports she did also have some blood in her emesis. The patient also reports shortness of breath and cough for a week as well as bilateral CVA tenderness. The patient reports that the symptoms are worse today. She denies taking anything for pain as she reports she is unable to keep it down. The patient is here for further treatment and evaluation of her symptoms.   Past Medical History  Diagnosis Date  . Emphysema of lung ([place])   . Depression   . Diverticulitis   . Chronic bronchitis ([place])   . Malignant brain tumor ([place]) 2007  . GERD (gastroesophageal reflux disease)   . Allergy   . Hepatitis C   . Hypertension   . Chronic kidney disease   . Migraines   . Urine incontinence    . Seizures ([place])   . Stroke [facility]) 2007    during brain surgery  . Pancreatitis, alcoholic 1700  . Rectovaginal fistula   . H/O ETOH abuse     Sober since 2009  . H/O drug abuse     Clean since 2009  . Pancreatic ascites   . Cirrhosis [facility])     Patient Active Problem List   Diagnosis Date Noted  . ALC (alcoholic liver cirrhosis) ([place]) 12/23/2014  . Clinical depression 12/23/2014  . Anxiety 12/23/2014  . Diabetes mellitus, type 2 ([place]) 12/23/2014  . HTN (hypertension) 11/05/2014  . Liver cirrhosis ([place]) 07/19/2014  . 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 Chronic pancreatitis ([place]) 11/03/2012  . Seizure disorder ([place]) 11/03/2012  . Benign meningioma of brain ([place]) 11/03/2012  . Screening for breast cancer 11/03/2012  . Chronic pelvic pain in female 11/03/2012  . Hepatitis C virus infection without hepatic coma 11/03/2012  . UTI (urinary tract infection) 11/03/2012  . Alcohol abuse, unspecified 11/03/2012  . Chronic liver disease 11/03/2012  . Schizophrenia ([place]) 11/03/2012  . Narcotic abuse 11/03/2012  . 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 Breast screening 11/03/2012  . 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 Nondependent alcohol abuse 11/03/2012  . 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 Female genital symptoms 11/03/2012  . Infection of urinary tract 11/03/2012  Past Surgical History  Procedure Laterality Date  . Appendectomy  1999  . Eye surgery    . Rectovaginal fistula repair w/ colostomy  2011  . Brain surgery  2007    malignant brain tumor  . Cholecystectomy  2001  . Abdominal hysterectomy  1979    Current Outpatient Rx  Name  Route  Sig  Dispense  Refill  . ADVAIR DISKUS 500-50 MCG/DOSE AEPB      inhale 1 dose by mouth twice a day      0     Dispense as written.   Marland Kitchen albuterol (PROAIR HFA) 108 (90 BASE) MCG/ACT inhaler               . ALPRAZolam (XANAX) 1 MG tablet    Oral   Take 1 tablet (1 mg total) by mouth 2 (two) times daily.   120 tablet   0   . busPIRone (BUSPAR) 10 MG tablet   Oral   Take 10 mg by mouth.         . CREON 12000 UNITS CPEP   Oral   Take 2 capsules by mouth 3 (three) times daily before meals.          . docusate sodium (STOOL SOFTENER) 100 MG capsule   Oral   Take 100 mg by mouth.         . fesoterodine (TOVIAZ) 8 MG TB24 tablet   Oral   Take 8 mg by mouth.         . furosemide (LASIX) 20 MG tablet   Oral   Take 20 mg by mouth daily.         Marland Kitchen gabapentin (NEURONTIN) 300 MG capsule   Oral   Take 300 mg by mouth 3 (three) times daily.          . hydrOXYzine (ATARAX/VISTARIL) 25 MG tablet   Oral   Take 25 mg by mouth.         . Ipratropium-Albuterol (COMBIVENT) 20-100 MCG/ACT AERS respimat   Inhalation   Inhale into the lungs.         . lactulose (CHRONULAC) 10 GM/15ML solution   Oral   Take 20 g by mouth daily as needed for mild constipation.          . Melatonin 3 MG TABS   Oral   Take 10 mg by mouth.         . metFORMIN (GLUCOPHAGE) 1000 MG tablet   Oral   Take 1,000 mg by mouth 2 (two) times daily.         . metoCLOPramide (REGLAN) 10 MG tablet   Oral   Take by mouth.         . Misc Natural Products (HORNY GOAT WEED) CAPS   Oral   Take 1 capsule by mouth.         . ondansetron (ZOFRAN) 8 MG tablet   Oral   Take by mouth.         . Oxycodone HCl 20 MG TABS   Oral   Take 1 tablet by mouth every 8 (eight) hours as needed.      0   . PARoxetine (PAXIL) 20 MG tablet   Oral   Take 20 mg by mouth.         . pravastatin (PRAVACHOL) 10 MG tablet               . ranitidine (ZANTAC) 150 MG capsule   Oral  Take 150 mg by mouth once.         . saxagliptin HCl (ONGLYZA) 2.5 MG TABS tablet   Oral   Take 2.5 mg by mouth.         . sertraline (ZOLOFT) 50 MG tablet   Oral   Take 50 mg by mouth daily.         . solifenacin (VESICARE) 10 MG tablet    Oral   Take 10 mg by mouth.         . temazepam (RESTORIL) 30 MG capsule   Oral   Take 30 mg by mouth at bedtime.         Marland Kitchen tiotropium (SPIRIVA) 18 MCG inhalation capsule   Inhalation   Place 18 mcg into inhaler and inhale 2 (two) times daily as needed (shortness of breath).          . triamcinolone (NASACORT ALLERGY 24HR) 55 MCG/ACT AERO nasal inhaler   Nasal   Place 2 sprays into the nose daily as needed (allergies).         Marland Kitchen Umeclidinium-Vilanterol 62.5-25 MCG/INH AEPB   Inhalation   Inhale 1 puff into the lungs daily as needed (for shortness of breath).         Marland Kitchen amitriptyline (ELAVIL) 50 MG tablet   Oral   Take 1 tablet (50 mg total) by mouth at bedtime.   30 tablet   3   . amLODipine (NORVASC) 10 MG tablet   Oral   Take 1 tablet (10 mg total) by mouth daily.   30 tablet   4   . cefdinir (OMNICEF) 300 MG capsule   Oral   Take 300 mg by mouth 2 (two) times daily.         . cephALEXin (KEFLEX) 500 MG capsule   Oral   Take 1 capsule (500 mg total) by mouth 3 (three) times daily.   36 capsule   0   . clobetasol cream (TEMOVATE) 0.05 %               . escitalopram (LEXAPRO) 10 MG tablet   Oral   Take 1 tablet (10 mg total) by mouth daily.   30 tablet   4   . Ledipasvir-Sofosbuvir (HARVONI) 90-400 MG TABS   Oral   Take 1 tablet by mouth daily.   28 tablet   2   . levETIRAcetam (KEPPRA) 500 MG tablet   Oral   Take 1 tablet (500 mg total) by mouth 2 (two) times daily.   60 tablet   3   . levofloxacin (LEVAQUIN) 500 MG tablet   Oral   Take 500 mg by mouth.         . levofloxacin (LEVAQUIN) 750 MG tablet   Oral   Take 1 tablet (750 mg total) by mouth daily.   10 tablet   0   . lidocaine (XYLOCAINE) 2 % jelly   Topical   Apply 1 application topically as needed.   30 mL   2   . metoCLOPramide (REGLAN) 10 MG tablet   Oral   Take 1 tablet (10 mg total) by mouth 3 (three) times daily with meals.   15 tablet   0   . metoprolol  succinate (TOPROL-XL) 25 MG 24 hr tablet   Oral   Take 1 tablet (25 mg total) by mouth daily.   30 tablet   4   . omeprazole (PRILOSEC) 20 MG capsule   Oral  Take 1 capsule (20 mg total) by mouth daily.   30 capsule   11     Please stop all of the other PPIs if on profile   . oxyCODONE (ROXICODONE) 15 MG immediate release tablet   Oral   Take 1.5 tablets (22.5 mg total) by mouth every 6 (six) hours as needed for pain. Patient not taking: Reported on 04/18/2015   180 tablet   0   . predniSONE (DELTASONE) 5 MG tablet               . promethazine (PHENERGAN) 25 MG tablet   Oral   Take 25 mg by mouth.         . QUEtiapine (SEROQUEL) 100 MG tablet   Oral   Take 3.5 tablets (350 mg total) by mouth daily.   105 tablet   4   . terconazole (TERAZOL 7) 0.4 % vaginal cream   Vaginal   Place 1 applicator vaginally at bedtime.   45 g   0   . traMADol (ULTRAM) 50 MG tablet               . zolpidem (AMBIEN) 10 MG tablet   Oral   Take 1 tablet (10 mg total) by mouth at bedtime.   30 tablet   3     Allergies Ciprofloxacin; Dilaudid; Hydromorphone; Ibuprofen; Sulfa antibiotics; Zofran; Zofran; Amoxicillin; Chocolate; Fentanyl; Penicillins; and Strawberry  Family History  Problem Relation Age of Onset  . Hypertension Mother   . Heart disease Father   . Diabetes Father   . Cancer Sister     brain  . Cancer Grandchild 8    brain tumor    Social History Social History  Substance Use Topics  . Smoking status: Current Some Day Smoker -- 0.25 packs/day    Types: Cigarettes  . Smokeless tobacco: Never Used     Comment: cutting back  . Alcohol Use: No     Comment: no alcohol since 2013    Review of Systems Constitutional: No fever/chills Eyes: No visual changes. ENT: No sore throat. Cardiovascular: Denies chest pain. Respiratory: Cough and shortness of breath. Gastrointestinal:  abdominal pain.  nausea,  Vomiting, hematemesis,  No diarrhea.  No  constipation. Genitourinary: Negative for dysuria. Musculoskeletal:  back pain. Skin: Negative for rash. Neurological: Negative for headaches, focal weakness or numbness.  10-point ROS otherwise negative.  ____________________________________________   PHYSICAL EXAM:  VITAL SIGNS: ED Triage Vitals  Enc Vitals Group     BP 04/17/15 2252 99/70 mmHg     Pulse Rate 04/17/15 2252 102     Resp 04/17/15 2252 18     Temp 04/17/15 2252 98.2 F (36.8 C)     Temp Source 04/17/15 2252 Oral     SpO2 04/17/15 2252 95 %     Weight 04/17/15 2249 251 lb (113.853 kg)     Height 04/17/15 2249 5\' 8"  (1.727 m)     Head Cir --      Peak Flow --      Pain Score --      Pain Loc --      Pain Edu? --      Excl. in [place]? --     Constitutional: Alert and oriented. Disheveled appearing and in moderate distress. Eyes: Conjunctivae are normal. PERRL. EOMI. Head: Atraumatic. Nose: No congestion/rhinnorhea. Mouth/Throat: Mucous membranes are moist.  Oropharynx non-erythematous. Cardiovascular: Normal rate, regular rhythm. Grossly normal heart sounds.  Good peripheral circulation. Respiratory: Normal respiratory  effort.  No retractions. Scattered wheezes throughout Gastrointestinal: Soft with upper abd tenderness to palpation. No distention. Positive bowel sounds Musculoskeletal: No lower extremity tenderness nor edema.   Neurologic:  Normal speech and language.  Skin:  Skin is warm, dry and intact.  Psychiatric: Mood and affect are normal.   ____________________________________________   LABS (all labs ordered are listed, but only abnormal results are displayed)  Labs Reviewed  CBC - Abnormal; Notable for the following:    Hemoglobin 9.7 (*)    HCT 31.3 (*)    MCV 77.5 (*)    MCH 24.0 (*)    MCHC 31.0 (*)    RDW 19.7 (*)    Platelets 135 (*)    All other components within normal limits  COMPREHENSIVE METABOLIC PANEL - Abnormal; Notable for the following:    Potassium 3.1 (*)    Calcium  8.5 (*)    Albumin 3.2 (*)    AST 45 (*)    Alkaline Phosphatase 156 (*)    All other components within normal limits  URINALYSIS COMPLETEWITH MICROSCOPIC ([facility] ONLY) - Abnormal; Notable for the following:    Color, Urine YELLOW (*)    APPearance HAZY (*)    Nitrite POSITIVE (*)    Leukocytes, UA 1+ (*)    Bacteria, UA RARE (*)    Squamous Epithelial / LPF 6-30 (*)    All other components within normal limits  LIPASE, BLOOD   ____________________________________________  EKG  ED ECG REPORT I, Loney Hering, the attending physician, personally viewed and interpreted this ECG.   Date: 04/18/2015  EKG Time: 0025  Rate: 93  Rhythm: normal EKG, normal sinus rhythm, unchanged from previous tracings, normal sinus rhythm  Axis: normal  Intervals:Prolonged QTC  ST&T Change: none  ____________________________________________  RADIOLOGY  CXR: Increased patchy right lung base opacity suspicious for developing pneumonia in this setting.  ____________________________________________   PROCEDURES  Procedure(s) performed: None  Critical Care performed: No  ____________________________________________   INITIAL IMPRESSION / ASSESSMENT AND PLAN / ED COURSE  Pertinent labs & imaging results that were available during my care of the patient were reviewed by me and considered in my medical decision making (see chart for details).  This is a 59 year old female who comes in today with some upper abdominal pain and a history of chronic pancreatitis. The patient also reports she has cough and some shortness of breath. We will do some blood work to a chest x-ray and give the patient some fluids as well as some nausea medicine. I will reassess the patient once I perceived her blood work results.  The patient did receive a dose of levofloxacin as well as Magnesium for her prolonged QTC. The patient was sleeping in the ED and had some mild improvement of her pain. Given the patient's  chronic pain history I did not give the patient any narcotic pain medicine at this time. The patient reports she is supposed to be having a prescription filled today from her pain management clinic. I informed the patient that for her abdominal pain she doesn't to follow-up with her primary care physician. Otherwise the patient will be discharged home to follow-up with her doctor. ____________________________________________   FINAL CLINICAL IMPRESSION(S) / ED DIAGNOSES  Final diagnoses:  Pain of upper abdomen  Chronic pancreatitis, unspecified pancreatitis type ([place])  Non-intractable vomiting with nausea, vomiting of unspecified type  Community acquired pneumonia      Loney Hering, MD 04/18/15 0725

## 2015-04-18 NOTE — Discharge Instructions (Signed)
Abdominal Pain, Adult Many things can cause abdominal pain. Usually, abdominal pain is not caused by a disease and will improve without treatment. It can often be observed and treated at home. Your health care provider will do a physical exam and possibly order blood tests and X-rays to help determine the seriousness of your pain. However, in many cases, more time must pass before a clear cause of the pain can be found. Before that point, your health care provider may not know if you need more testing or further treatment. HOME CARE INSTRUCTIONS Monitor your abdominal pain for any changes. The following actions may help to alleviate any discomfort you are experiencing:  Only take over-the-counter or prescription medicines as directed by your health care provider.  Do not take laxatives unless directed to do so by your health care provider.  Try a clear liquid diet (broth, tea, or water) as directed by your health care provider. Slowly move to a bland diet as tolerated. SEEK MEDICAL CARE IF:  You have unexplained abdominal pain.  You have abdominal pain associated with nausea or diarrhea.  You have pain when you urinate or have a bowel movement.  You experience abdominal pain that wakes you in the night.  You have abdominal pain that is worsened or improved by eating food.  You have abdominal pain that is worsened with eating fatty foods.  You have a fever. SEEK IMMEDIATE MEDICAL CARE IF:  Your pain does not go away within 2 hours.  You keep throwing up (vomiting).  Your pain is felt only in portions of the abdomen, such as the right side or the left lower portion of the abdomen.  You pass bloody or black tarry stools. MAKE SURE YOU:  Understand these instructions.  Will watch your condition.  Will get help right away if you are not doing well or get worse.   This information is not intended to replace advice given to you by your health care provider. Make sure you discuss  any questions you have with your health care provider.   Document Released: 04/03/2005 Document Revised: 03/15/2015 Document Reviewed: 03/03/2013 Elsevier Interactive Patient Education 2016 [place] Pneumonia, Adult Pneumonia is an infection of the lungs. There are different types of pneumonia. One type can develop while a person is in a hospital. A different type, called community-acquired pneumonia, develops in people who are not, or have not recently been, in the hospital or other health care facility.  CAUSES Pneumonia may be caused by bacteria, viruses, or funguses. Community-acquired pneumonia is often caused by Streptococcus pneumonia bacteria. These bacteria are often passed from one person to another by breathing in droplets from the cough or sneeze of an infected person. RISK FACTORS The condition is more likely to develop in:  People who havechronic diseases, such as chronic obstructive pulmonary disease (COPD), asthma, congestive heart failure, cystic fibrosis, diabetes, or kidney disease.  People who haveearly-stage or late-stage HIV.  People who havesickle cell disease.  People who havehad their spleen removed (splenectomy).  People who havepoor Human resources officer.  People who havemedical conditions that increase the risk of breathing in (aspirating) secretions their own mouth and nose.   People who havea weakened immune system (immunocompromised).  People who smoke.  People whotravel to areas where pneumonia-causing germs commonly exist.  People whoare around animal habitats or animals that have pneumonia-causing germs, including birds, bats, rabbits, cats, and farm animals. SYMPTOMS Symptoms of this condition include:  Adry cough.  A wet (  productive) cough.  Fever.  Sweating.  Chest pain, especially when breathing deeply or coughing.  Rapid breathing or difficulty breathing.  Shortness of breath.  Shaking  chills.  Fatigue.  Muscle aches. DIAGNOSIS Your health care provider will take a medical history and perform a physical exam. You may also have other tests, including:  Imaging studies of your chest, including X-rays.  Tests to check your blood oxygen level and other blood gases.  Other tests on blood, mucus (sputum), fluid around your lungs (pleural fluid), and urine. If your pneumonia is severe, other tests may be done to identify the specific cause of your illness. TREATMENT The type of treatment that you receive depends on many factors, such as the cause of your pneumonia, the medicines you take, and other medical conditions that you have. For most adults, treatment and recovery from pneumonia may occur at home. In some cases, treatment must happen in a hospital. Treatment may include:  Antibiotic medicines, if the pneumonia was caused by bacteria.  Antiviral medicines, if the pneumonia was caused by a virus.  Medicines that are given by mouth or through an IV tube.  Oxygen.  Respiratory therapy. Although rare, treating severe pneumonia may include:  Mechanical ventilation. This is done if you are not breathing well on your own and you cannot maintain a safe blood oxygen level.  Thoracentesis. This procedureremoves fluid around one lung or both lungs to help you breathe better. HOME CARE INSTRUCTIONS  Take over-the-counter and prescription medicines only as told by your health care provider.  Only takecough medicine if you are losing sleep. Understand that cough medicine can prevent your body's natural ability to remove mucus from your lungs.  If you were prescribed an antibiotic medicine, take it as told by your health care provider. Do not stop taking the antibiotic even if you start to feel better.  Sleep in a semi-upright position at night. Try sleeping in a reclining chair, or place a few pillows under your head.  Do not use tobacco products, including cigarettes,  chewing tobacco, and e-cigarettes. If you need help quitting, ask your health care provider.  Drink enough water to keep your urine clear or pale yellow. This will help to thin out mucus secretions in your lungs. PREVENTION There are ways that you can decrease your risk of developing community-acquired pneumonia. Consider getting a pneumococcal vaccine if:  You are older than 58 years of age.  You are older than 58 years of age and are undergoing cancer treatment, have chronic lung disease, or have other medical conditions that affect your immune system. Ask your health care provider if this applies to you. There are different types and schedules of pneumococcal vaccines. Ask your health care provider which vaccination option is best for you. You may also prevent community-acquired pneumonia if you take these actions:  Get an influenza vaccine every year. Ask your health care provider which type of influenza vaccine is best for you.  Go to the dentist on a regular basis.  Wash your hands often. Use hand sanitizer if soap and water are not available. SEEK MEDICAL CARE IF:  You have a fever.  You are losing sleep because you cannot control your cough with cough medicine. SEEK IMMEDIATE MEDICAL CARE IF:  You have worsening shortness of breath.  You have increased chest pain.  Your sickness becomes worse, especially if you are an older adult or have a weakened immune system.  You cough up blood.   This information  is not intended to replace advice given to you by your health care provider. Make sure you discuss any questions you have with your health care provider.   Document Released: 06/24/2005 Document Revised: 03/15/2015 Document Reviewed: 10/19/2014 Elsevier Interactive Patient Education 2016 [employer]  Nausea and Vomiting Nausea is a sick feeling that often comes before throwing up (vomiting). Vomiting is a reflex where stomach contents come out of your mouth. Vomiting  can cause severe loss of body fluids (dehydration). Children and elderly adults can become dehydrated quickly, especially if they also have diarrhea. Nausea and vomiting are symptoms of a condition or disease. It is important to find the cause of your symptoms. CAUSES   Direct irritation of the stomach lining. This irritation can result from increased acid production (gastroesophageal reflux disease), infection, food poisoning, taking certain medicines (such as nonsteroidal anti-inflammatory drugs), alcohol use, or tobacco use.  Signals from the brain.These signals could be caused by a headache, heat exposure, an inner ear disturbance, increased pressure in the brain from injury, infection, a tumor, or a concussion, pain, emotional stimulus, or metabolic problems.  An obstruction in the gastrointestinal tract (bowel obstruction).  Illnesses such as diabetes, hepatitis, gallbladder problems, appendicitis, kidney problems, cancer, sepsis, atypical symptoms of a heart attack, or eating disorders.  Medical treatments such as chemotherapy and radiation.  Receiving medicine that makes you sleep (general anesthetic) during surgery. DIAGNOSIS Your caregiver may ask for tests to be done if the problems do not improve after a few days. Tests may also be done if symptoms are severe or if the reason for the nausea and vomiting is not clear. Tests may include:  Urine tests.  Blood tests.  Stool tests.  Cultures (to look for evidence of infection).  X-rays or other imaging studies. Test results can help your caregiver make decisions about treatment or the need for additional tests. TREATMENT You need to stay well hydrated. Drink frequently but in small amounts.You may wish to drink water, sports drinks, clear broth, or eat frozen ice pops or gelatin dessert to help stay hydrated.When you eat, eating slowly may help prevent nausea.There are also some antinausea medicines that may help prevent  nausea. HOME CARE INSTRUCTIONS   Take all medicine as directed by your caregiver.  If you do not have an appetite, do not force yourself to eat. However, you must continue to drink fluids.  If you have an appetite, eat a normal diet unless your caregiver tells you differently.  Eat a variety of complex carbohydrates (rice, wheat, potatoes, bread), lean meats, yogurt, fruits, and vegetables.  Avoid high-fat foods because they are more difficult to digest.  Drink enough water and fluids to keep your urine clear or pale yellow.  If you are dehydrated, ask your caregiver for specific rehydration instructions. Signs of dehydration may include:  Severe thirst.  Dry lips and mouth.  Dizziness.  Dark urine.  Decreasing urine frequency and amount.  Confusion.  Rapid breathing or pulse. SEEK IMMEDIATE MEDICAL CARE IF:   You have blood or brown flecks (like coffee grounds) in your vomit.  You have black or bloody stools.  You have a severe headache or stiff neck.  You are confused.  You have severe abdominal pain.  You have chest pain or trouble breathing.  You do not urinate at least once every 8 hours.  You develop cold or clammy skin.  You continue to vomit for longer than 24 to 48 hours.  You have a fever. MAKE  SURE YOU:   Understand these instructions.  Will watch your condition.  Will get help right away if you are not doing well or get worse.   This information is not intended to replace advice given to you by your health care provider. Make sure you discuss any questions you have with your health care provider.   Document Released: 06/24/2005 Document Revised: 09/16/2011 Document Reviewed: 11/21/2010 Elsevier Interactive Patient Education [employer].

## 2015-04-20 DIAGNOSIS — R162 Hepatomegaly with splenomegaly, not elsewhere classified: Secondary | ICD-10-CM | POA: Diagnosis not present

## 2015-04-20 DIAGNOSIS — R197 Diarrhea, unspecified: Secondary | ICD-10-CM | POA: Diagnosis not present

## 2015-04-20 DIAGNOSIS — K7460 Unspecified cirrhosis of liver: Secondary | ICD-10-CM | POA: Diagnosis not present

## 2015-04-20 DIAGNOSIS — R1010 Upper abdominal pain, unspecified: Secondary | ICD-10-CM | POA: Diagnosis not present

## 2015-04-20 DIAGNOSIS — R112 Nausea with vomiting, unspecified: Secondary | ICD-10-CM | POA: Diagnosis not present

## 2015-05-02 DIAGNOSIS — R161 Splenomegaly, not elsewhere classified: Secondary | ICD-10-CM | POA: Diagnosis not present

## 2015-05-02 DIAGNOSIS — R110 Nausea: Secondary | ICD-10-CM | POA: Diagnosis not present

## 2015-05-02 DIAGNOSIS — I129 Hypertensive chronic kidney disease with stage 1 through stage 4 chronic kidney disease, or unspecified chronic kidney disease: Secondary | ICD-10-CM | POA: Diagnosis not present

## 2015-05-02 DIAGNOSIS — K3189 Other diseases of stomach and duodenum: Secondary | ICD-10-CM | POA: Diagnosis not present

## 2015-05-02 DIAGNOSIS — F17210 Nicotine dependence, cigarettes, uncomplicated: Secondary | ICD-10-CM | POA: Diagnosis not present

## 2015-05-02 DIAGNOSIS — G8929 Other chronic pain: Secondary | ICD-10-CM | POA: Diagnosis not present

## 2015-05-02 DIAGNOSIS — Z880 Allergy status to penicillin: Secondary | ICD-10-CM | POA: Diagnosis not present

## 2015-05-02 DIAGNOSIS — J439 Emphysema, unspecified: Secondary | ICD-10-CM | POA: Diagnosis not present

## 2015-05-02 DIAGNOSIS — F329 Major depressive disorder, single episode, unspecified: Secondary | ICD-10-CM | POA: Diagnosis not present

## 2015-05-02 DIAGNOSIS — N189 Chronic kidney disease, unspecified: Secondary | ICD-10-CM | POA: Diagnosis not present

## 2015-05-02 DIAGNOSIS — I8500 Esophageal varices without bleeding: Secondary | ICD-10-CM | POA: Diagnosis not present

## 2015-05-02 DIAGNOSIS — K766 Portal hypertension: Secondary | ICD-10-CM | POA: Diagnosis not present

## 2015-05-02 DIAGNOSIS — Z888 Allergy status to other drugs, medicaments and biological substances status: Secondary | ICD-10-CM | POA: Diagnosis not present

## 2015-05-02 DIAGNOSIS — J9811 Atelectasis: Secondary | ICD-10-CM | POA: Diagnosis not present

## 2015-05-02 DIAGNOSIS — K317 Polyp of stomach and duodenum: Secondary | ICD-10-CM | POA: Diagnosis not present

## 2015-05-02 DIAGNOSIS — K7030 Alcoholic cirrhosis of liver without ascites: Secondary | ICD-10-CM | POA: Diagnosis not present

## 2015-05-02 DIAGNOSIS — Z91018 Allergy to other foods: Secondary | ICD-10-CM | POA: Diagnosis not present

## 2015-05-02 DIAGNOSIS — M419 Scoliosis, unspecified: Secondary | ICD-10-CM | POA: Diagnosis not present

## 2015-05-02 DIAGNOSIS — R109 Unspecified abdominal pain: Secondary | ICD-10-CM | POA: Diagnosis not present

## 2015-05-02 DIAGNOSIS — K920 Hematemesis: Secondary | ICD-10-CM | POA: Diagnosis not present

## 2015-05-02 DIAGNOSIS — K6389 Other specified diseases of intestine: Secondary | ICD-10-CM | POA: Diagnosis not present

## 2015-05-02 DIAGNOSIS — F209 Schizophrenia, unspecified: Secondary | ICD-10-CM | POA: Diagnosis not present

## 2015-05-02 DIAGNOSIS — D3A019 Benign carcinoid tumor of the small intestine, unspecified portion: Secondary | ICD-10-CM | POA: Diagnosis not present

## 2015-05-02 DIAGNOSIS — K861 Other chronic pancreatitis: Secondary | ICD-10-CM | POA: Diagnosis not present

## 2015-05-02 DIAGNOSIS — R0602 Shortness of breath: Secondary | ICD-10-CM | POA: Diagnosis not present

## 2015-05-02 DIAGNOSIS — Z882 Allergy status to sulfonamides status: Secondary | ICD-10-CM | POA: Diagnosis not present

## 2015-05-02 DIAGNOSIS — R0902 Hypoxemia: Secondary | ICD-10-CM | POA: Diagnosis not present

## 2015-05-02 DIAGNOSIS — B182 Chronic viral hepatitis C: Secondary | ICD-10-CM | POA: Diagnosis not present

## 2015-05-02 DIAGNOSIS — R112 Nausea with vomiting, unspecified: Secondary | ICD-10-CM | POA: Diagnosis not present

## 2015-05-02 DIAGNOSIS — M810 Age-related osteoporosis without current pathological fracture: Secondary | ICD-10-CM | POA: Diagnosis not present

## 2015-05-02 DIAGNOSIS — R05 Cough: Secondary | ICD-10-CM | POA: Diagnosis not present

## 2015-05-02 DIAGNOSIS — G40909 Epilepsy, unspecified, not intractable, without status epilepticus: Secondary | ICD-10-CM | POA: Diagnosis not present

## 2015-05-02 DIAGNOSIS — J441 Chronic obstructive pulmonary disease with (acute) exacerbation: Secondary | ICD-10-CM | POA: Diagnosis not present

## 2015-05-02 DIAGNOSIS — K219 Gastro-esophageal reflux disease without esophagitis: Secondary | ICD-10-CM | POA: Diagnosis not present

## 2015-05-02 DIAGNOSIS — K7460 Unspecified cirrhosis of liver: Secondary | ICD-10-CM | POA: Diagnosis not present

## 2015-05-02 DIAGNOSIS — I4581 Long QT syndrome: Secondary | ICD-10-CM | POA: Diagnosis not present

## 2015-05-02 DIAGNOSIS — E119 Type 2 diabetes mellitus without complications: Secondary | ICD-10-CM | POA: Diagnosis not present

## 2015-05-02 DIAGNOSIS — R042 Hemoptysis: Secondary | ICD-10-CM | POA: Diagnosis not present

## 2015-05-04 IMAGING — CR DG CHEST 2V
1 series · 2 of 2 positions shown · non-contrast
Comparison: Chest radiograph April 09, 2013

CLINICAL DATA: Chest pain, recent history of septic shock.

EXAM:
CHEST  2 VIEW

[Series 10: w chest pa · 0.14mm/px · 2 of 2 slices shown]
[im 1/2]
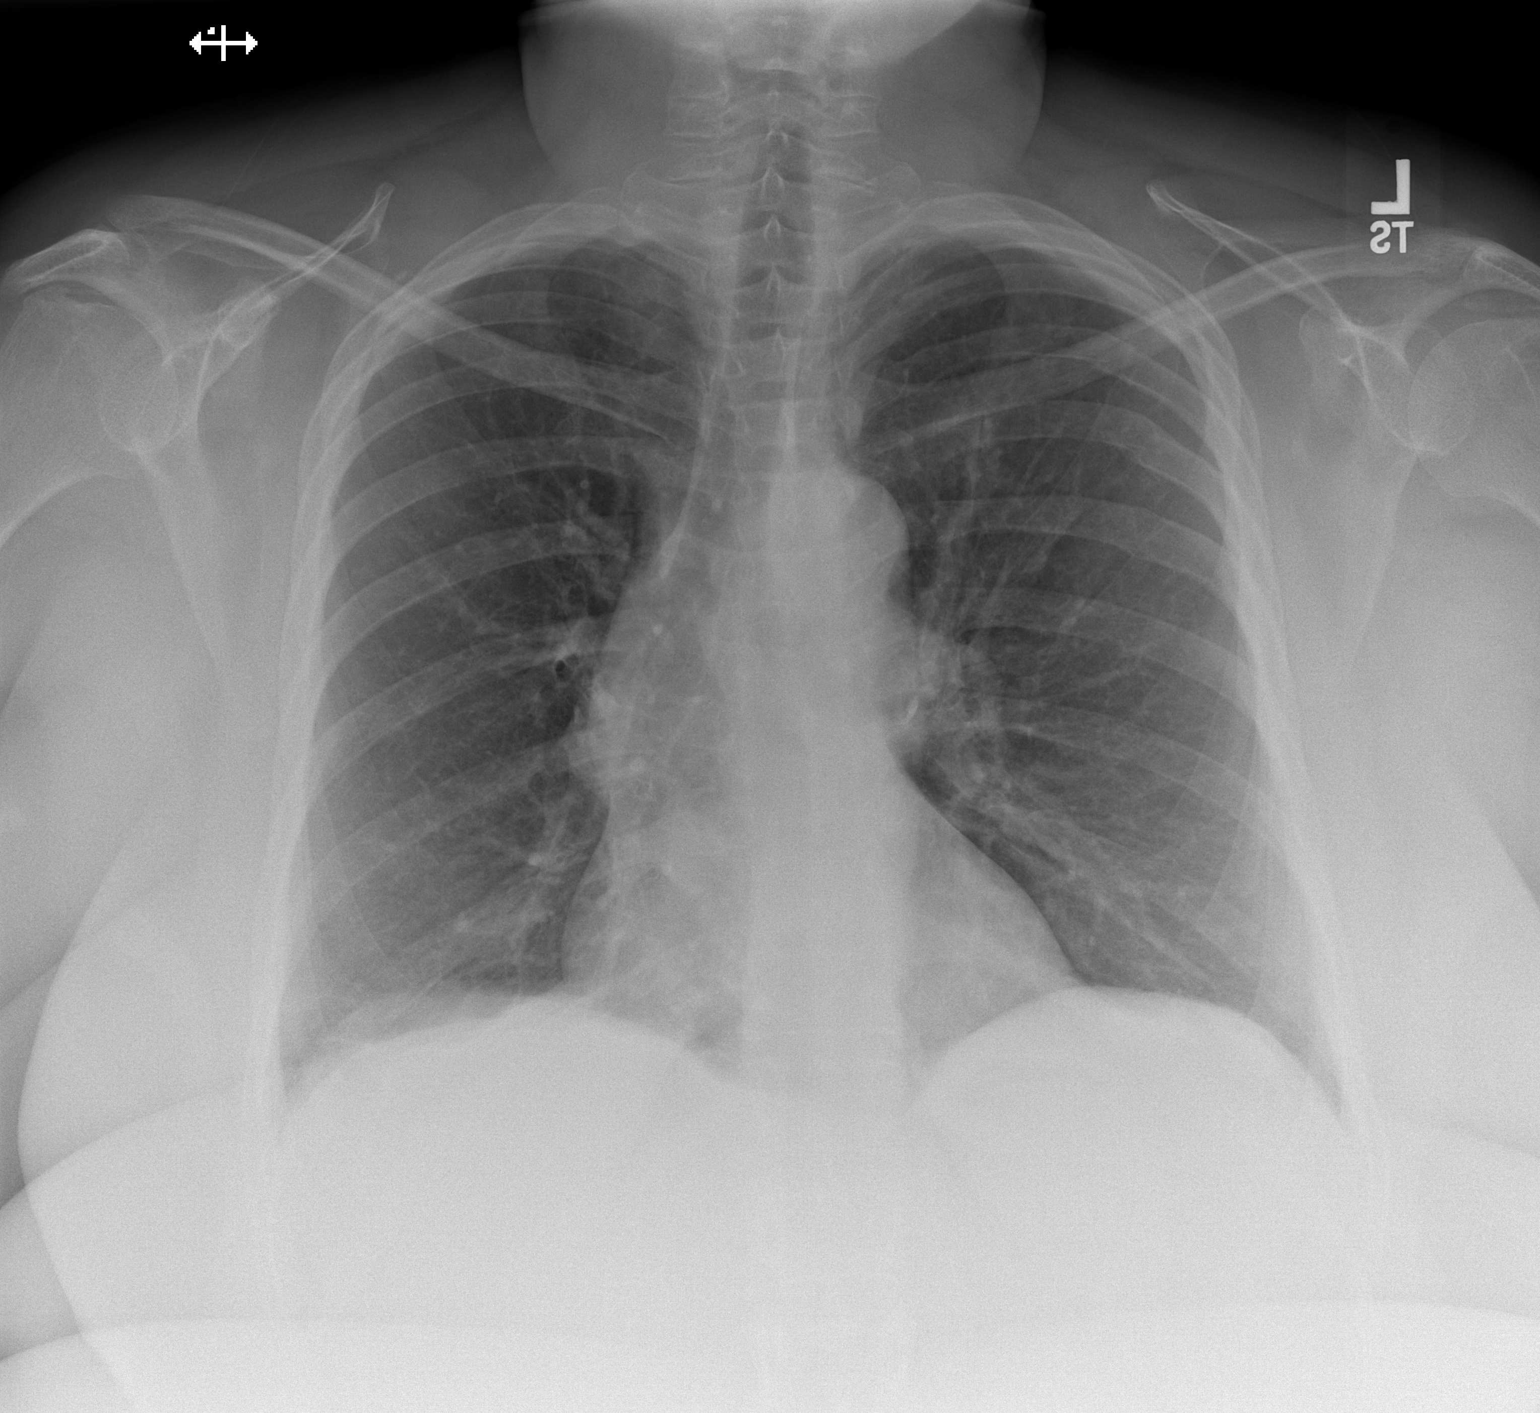
[im 2/2]
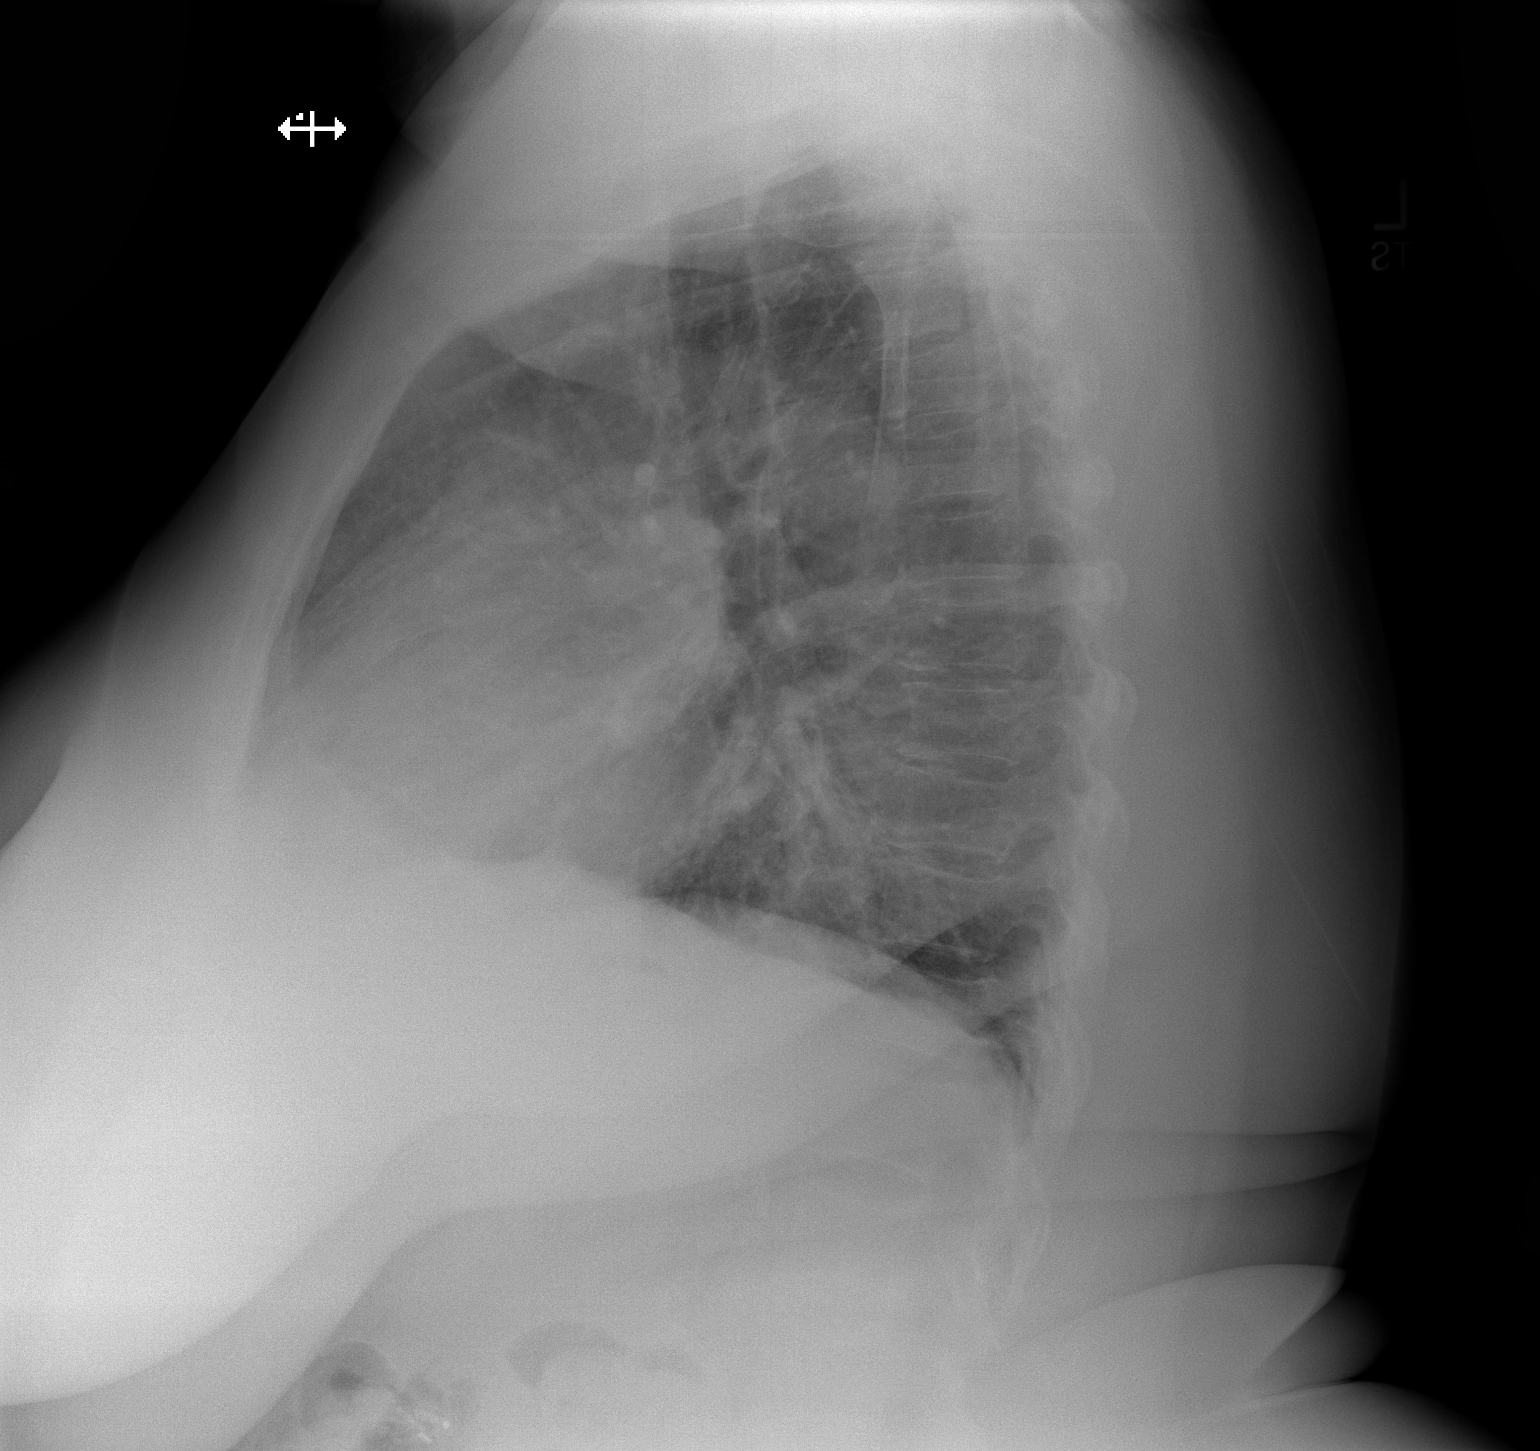

[2 of 2 positions shown; findings below may reference images not displayed]

FINDINGS: Cardiomediastinal silhouette is unremarkable for this portable
examination with crowded vasculature markings. The lungs are clear
without pleural effusions or focal consolidations. Trachea projects
midline and there is no pneumothorax. Included soft tissue planes
and osseous structures are non-suspicious. Remote upper left
posterior rib fractures.
IMPRESSION: No active cardiopulmonary disease.

  By: Jorja Hyppolite

## 2015-05-08 ENCOUNTER — Other Ambulatory Visit: Payer: Self-pay | Admitting: Obstetrics and Gynecology

## 2015-05-11 DIAGNOSIS — F329 Major depressive disorder, single episode, unspecified: Secondary | ICD-10-CM | POA: Diagnosis not present

## 2015-05-11 DIAGNOSIS — R300 Dysuria: Secondary | ICD-10-CM | POA: Diagnosis not present

## 2015-05-11 DIAGNOSIS — K769 Liver disease, unspecified: Secondary | ICD-10-CM | POA: Diagnosis not present

## 2015-05-11 DIAGNOSIS — E1142 Type 2 diabetes mellitus with diabetic polyneuropathy: Secondary | ICD-10-CM | POA: Diagnosis not present

## 2015-05-11 DIAGNOSIS — K920 Hematemesis: Secondary | ICD-10-CM | POA: Diagnosis not present

## 2015-05-12 DIAGNOSIS — K769 Liver disease, unspecified: Secondary | ICD-10-CM | POA: Diagnosis not present

## 2015-05-12 DIAGNOSIS — Z933 Colostomy status: Secondary | ICD-10-CM | POA: Diagnosis not present

## 2015-05-12 DIAGNOSIS — G894 Chronic pain syndrome: Secondary | ICD-10-CM | POA: Diagnosis not present

## 2015-05-12 DIAGNOSIS — K219 Gastro-esophageal reflux disease without esophagitis: Secondary | ICD-10-CM | POA: Diagnosis not present

## 2015-05-12 DIAGNOSIS — K7030 Alcoholic cirrhosis of liver without ascites: Secondary | ICD-10-CM | POA: Diagnosis not present

## 2015-05-12 DIAGNOSIS — B1920 Unspecified viral hepatitis C without hepatic coma: Secondary | ICD-10-CM | POA: Diagnosis not present

## 2015-05-12 DIAGNOSIS — Z7289 Other problems related to lifestyle: Secondary | ICD-10-CM | POA: Diagnosis not present

## 2015-05-12 DIAGNOSIS — K852 Alcohol induced acute pancreatitis without necrosis or infection: Secondary | ICD-10-CM | POA: Diagnosis not present

## 2015-05-12 DIAGNOSIS — E119 Type 2 diabetes mellitus without complications: Secondary | ICD-10-CM | POA: Diagnosis not present

## 2015-05-12 DIAGNOSIS — F17210 Nicotine dependence, cigarettes, uncomplicated: Secondary | ICD-10-CM | POA: Diagnosis not present

## 2015-05-12 DIAGNOSIS — J449 Chronic obstructive pulmonary disease, unspecified: Secondary | ICD-10-CM | POA: Diagnosis not present

## 2015-05-12 DIAGNOSIS — I129 Hypertensive chronic kidney disease with stage 1 through stage 4 chronic kidney disease, or unspecified chronic kidney disease: Secondary | ICD-10-CM | POA: Diagnosis not present

## 2015-05-12 DIAGNOSIS — Z79899 Other long term (current) drug therapy: Secondary | ICD-10-CM | POA: Diagnosis not present

## 2015-05-12 DIAGNOSIS — F1010 Alcohol abuse, uncomplicated: Secondary | ICD-10-CM | POA: Diagnosis not present

## 2015-05-12 DIAGNOSIS — L02411 Cutaneous abscess of right axilla: Secondary | ICD-10-CM | POA: Diagnosis not present

## 2015-05-12 DIAGNOSIS — F419 Anxiety disorder, unspecified: Secondary | ICD-10-CM | POA: Diagnosis not present

## 2015-05-12 DIAGNOSIS — Z9049 Acquired absence of other specified parts of digestive tract: Secondary | ICD-10-CM | POA: Diagnosis not present

## 2015-05-12 DIAGNOSIS — Z8673 Personal history of transient ischemic attack (TIA), and cerebral infarction without residual deficits: Secondary | ICD-10-CM | POA: Diagnosis not present

## 2015-05-12 DIAGNOSIS — N189 Chronic kidney disease, unspecified: Secondary | ICD-10-CM | POA: Diagnosis not present

## 2015-05-12 DIAGNOSIS — D329 Benign neoplasm of meninges, unspecified: Secondary | ICD-10-CM | POA: Diagnosis not present

## 2015-05-12 DIAGNOSIS — G40909 Epilepsy, unspecified, not intractable, without status epilepticus: Secondary | ICD-10-CM | POA: Diagnosis not present

## 2015-05-12 DIAGNOSIS — M810 Age-related osteoporosis without current pathological fracture: Secondary | ICD-10-CM | POA: Diagnosis not present

## 2015-05-12 DIAGNOSIS — F329 Major depressive disorder, single episode, unspecified: Secondary | ICD-10-CM | POA: Diagnosis not present

## 2015-05-12 DIAGNOSIS — F209 Schizophrenia, unspecified: Secondary | ICD-10-CM | POA: Diagnosis not present

## 2015-05-14 ENCOUNTER — Encounter: Payer: Self-pay | Admitting: Emergency Medicine

## 2015-05-14 ENCOUNTER — Observation Stay
Admission: EM | Admit: 2015-05-14 | Discharge: 2015-05-16 | Disposition: A | Discharge status: Home / Self Care | Payer: Medicare Other | Attending: Internal Medicine | Admitting: Internal Medicine

## 2015-05-14 ENCOUNTER — Emergency Department: Payer: Medicare Other

## 2015-05-14 DIAGNOSIS — R0781 Pleurodynia: Secondary | ICD-10-CM | POA: Insufficient documentation

## 2015-05-14 DIAGNOSIS — K7031 Alcoholic cirrhosis of liver with ascites: Secondary | ICD-10-CM | POA: Diagnosis not present

## 2015-05-14 DIAGNOSIS — Z8249 Family history of ischemic heart disease and other diseases of the circulatory system: Secondary | ICD-10-CM | POA: Diagnosis not present

## 2015-05-14 DIAGNOSIS — Z888 Allergy status to other drugs, medicaments and biological substances status: Secondary | ICD-10-CM | POA: Diagnosis not present

## 2015-05-14 DIAGNOSIS — N179 Acute kidney failure, unspecified: Secondary | ICD-10-CM | POA: Diagnosis not present

## 2015-05-14 DIAGNOSIS — Z8673 Personal history of transient ischemic attack (TIA), and cerebral infarction without residual deficits: Secondary | ICD-10-CM | POA: Insufficient documentation

## 2015-05-14 DIAGNOSIS — R5383 Other fatigue: Secondary | ICD-10-CM | POA: Insufficient documentation

## 2015-05-14 DIAGNOSIS — R0602 Shortness of breath: Secondary | ICD-10-CM | POA: Diagnosis not present

## 2015-05-14 DIAGNOSIS — I951 Orthostatic hypotension: Secondary | ICD-10-CM | POA: Diagnosis present

## 2015-05-14 DIAGNOSIS — I129 Hypertensive chronic kidney disease with stage 1 through stage 4 chronic kidney disease, or unspecified chronic kidney disease: Secondary | ICD-10-CM | POA: Diagnosis not present

## 2015-05-14 DIAGNOSIS — J449 Chronic obstructive pulmonary disease, unspecified: Secondary | ICD-10-CM | POA: Diagnosis not present

## 2015-05-14 DIAGNOSIS — R05 Cough: Secondary | ICD-10-CM | POA: Insufficient documentation

## 2015-05-14 DIAGNOSIS — R161 Splenomegaly, not elsewhere classified: Secondary | ICD-10-CM | POA: Diagnosis not present

## 2015-05-14 DIAGNOSIS — Z9049 Acquired absence of other specified parts of digestive tract: Secondary | ICD-10-CM | POA: Insufficient documentation

## 2015-05-14 DIAGNOSIS — R42 Dizziness and giddiness: Secondary | ICD-10-CM | POA: Diagnosis not present

## 2015-05-14 DIAGNOSIS — G8929 Other chronic pain: Secondary | ICD-10-CM | POA: Diagnosis not present

## 2015-05-14 DIAGNOSIS — Z882 Allergy status to sulfonamides status: Secondary | ICD-10-CM | POA: Diagnosis not present

## 2015-05-14 DIAGNOSIS — B1920 Unspecified viral hepatitis C without hepatic coma: Secondary | ICD-10-CM | POA: Insufficient documentation

## 2015-05-14 DIAGNOSIS — K861 Other chronic pancreatitis: Secondary | ICD-10-CM | POA: Insufficient documentation

## 2015-05-14 DIAGNOSIS — K219 Gastro-esophageal reflux disease without esophagitis: Secondary | ICD-10-CM | POA: Insufficient documentation

## 2015-05-14 DIAGNOSIS — Z808 Family history of malignant neoplasm of other organs or systems: Secondary | ICD-10-CM | POA: Insufficient documentation

## 2015-05-14 DIAGNOSIS — G9340 Encephalopathy, unspecified: Secondary | ICD-10-CM | POA: Diagnosis not present

## 2015-05-14 DIAGNOSIS — R1084 Generalized abdominal pain: Secondary | ICD-10-CM | POA: Diagnosis not present

## 2015-05-14 DIAGNOSIS — K860 Alcohol-induced chronic pancreatitis: Secondary | ICD-10-CM | POA: Diagnosis not present

## 2015-05-14 DIAGNOSIS — Z85841 Personal history of malignant neoplasm of brain: Secondary | ICD-10-CM | POA: Diagnosis not present

## 2015-05-14 DIAGNOSIS — R109 Unspecified abdominal pain: Secondary | ICD-10-CM | POA: Diagnosis not present

## 2015-05-14 DIAGNOSIS — K7290 Hepatic failure, unspecified without coma: Secondary | ICD-10-CM | POA: Insufficient documentation

## 2015-05-14 DIAGNOSIS — F419 Anxiety disorder, unspecified: Secondary | ICD-10-CM | POA: Diagnosis not present

## 2015-05-14 DIAGNOSIS — F329 Major depressive disorder, single episode, unspecified: Secondary | ICD-10-CM | POA: Insufficient documentation

## 2015-05-14 DIAGNOSIS — Z880 Allergy status to penicillin: Secondary | ICD-10-CM | POA: Diagnosis not present

## 2015-05-14 DIAGNOSIS — Z886 Allergy status to analgesic agent status: Secondary | ICD-10-CM | POA: Insufficient documentation

## 2015-05-14 DIAGNOSIS — Z833 Family history of diabetes mellitus: Secondary | ICD-10-CM | POA: Diagnosis not present

## 2015-05-14 DIAGNOSIS — E869 Volume depletion, unspecified: Secondary | ICD-10-CM | POA: Insufficient documentation

## 2015-05-14 DIAGNOSIS — Z79899 Other long term (current) drug therapy: Secondary | ICD-10-CM | POA: Insufficient documentation

## 2015-05-14 DIAGNOSIS — N183 Chronic kidney disease, stage 3 (moderate): Secondary | ICD-10-CM | POA: Insufficient documentation

## 2015-05-14 DIAGNOSIS — R140 Abdominal distension (gaseous): Secondary | ICD-10-CM | POA: Insufficient documentation

## 2015-05-14 DIAGNOSIS — F209 Schizophrenia, unspecified: Secondary | ICD-10-CM | POA: Insufficient documentation

## 2015-05-14 DIAGNOSIS — Z91018 Allergy to other foods: Secondary | ICD-10-CM | POA: Insufficient documentation

## 2015-05-14 DIAGNOSIS — R188 Other ascites: Secondary | ICD-10-CM

## 2015-05-14 DIAGNOSIS — G40909 Epilepsy, unspecified, not intractable, without status epilepticus: Secondary | ICD-10-CM | POA: Insufficient documentation

## 2015-05-14 DIAGNOSIS — F17210 Nicotine dependence, cigarettes, uncomplicated: Secondary | ICD-10-CM | POA: Diagnosis not present

## 2015-05-14 DIAGNOSIS — E860 Dehydration: Secondary | ICD-10-CM | POA: Diagnosis not present

## 2015-05-14 DIAGNOSIS — E1122 Type 2 diabetes mellitus with diabetic chronic kidney disease: Secondary | ICD-10-CM | POA: Diagnosis not present

## 2015-05-14 DIAGNOSIS — Z885 Allergy status to narcotic agent status: Secondary | ICD-10-CM | POA: Insufficient documentation

## 2015-05-14 LAB — CBC WITH DIFFERENTIAL/PLATELET
Basophils Absolute: 0.1 10*3/uL (ref 0–0.1)
Basophils Relative: 1 %
Eosinophils Absolute: 0.2 10*3/uL (ref 0–0.7)
Eosinophils Relative: 2 %
HEMATOCRIT: 31.6 % — AB (ref 35.0–47.0)
HEMOGLOBIN: 10 g/dL — AB (ref 12.0–16.0)
LYMPHS ABS: 3.3 10*3/uL (ref 1.0–3.6)
LYMPHS PCT: 33 %
MCH: 24.6 pg — AB (ref 26.0–34.0)
MCHC: 31.6 g/dL — AB (ref 32.0–36.0)
MCV: 78 fL — AB (ref 80.0–100.0)
MONOS PCT: 12 %
Monocytes Absolute: 1.2 10*3/uL — ABNORMAL HIGH (ref 0.2–0.9)
NEUTROS ABS: 5.3 10*3/uL (ref 1.4–6.5)
NEUTROS PCT: 52 %
Platelets: 179 10*3/uL (ref 150–440)
RBC: 4.06 MIL/uL (ref 3.80–5.20)
RDW: 19.6 % — ABNORMAL HIGH (ref 11.5–14.5)
WBC: 10.1 10*3/uL (ref 3.6–11.0)

## 2015-05-14 LAB — COMPREHENSIVE METABOLIC PANEL
ALBUMIN: 3.3 g/dL — AB (ref 3.5–5.0)
ALK PHOS: 176 U/L — AB (ref 38–126)
ALT: 43 U/L (ref 14–54)
ANION GAP: 3 — AB (ref 5–15)
AST: 67 U/L — ABNORMAL HIGH (ref 15–41)
BILIRUBIN TOTAL: 1.1 mg/dL (ref 0.3–1.2)
BUN: 9 mg/dL (ref 6–20)
CALCIUM: 8.8 mg/dL — AB (ref 8.9–10.3)
CO2: 24 mmol/L (ref 22–32)
Chloride: 110 mmol/L (ref 101–111)
Creatinine, Ser: 1.05 mg/dL — ABNORMAL HIGH (ref 0.44–1.00)
GFR, EST NON AFRICAN AMERICAN: 57 mL/min — AB (ref >60)
GLUCOSE: 140 mg/dL — AB (ref 65–99)
Potassium: 4 mmol/L (ref 3.5–5.1)
Sodium: 137 mmol/L (ref 135–145)
TOTAL PROTEIN: 7.7 g/dL (ref 6.5–8.1)

## 2015-05-14 LAB — URINALYSIS COMPLETE WITH MICROSCOPIC (ARMC ONLY)
BILIRUBIN URINE: NEGATIVE
Glucose, UA: NEGATIVE mg/dL
KETONES UR: NEGATIVE mg/dL
NITRITE: NEGATIVE
Protein, ur: NEGATIVE mg/dL
SPECIFIC GRAVITY, URINE: 1.011 (ref 1.005–1.030)
pH: 6 (ref 5.0–8.0)

## 2015-05-14 LAB — ETHANOL: Alcohol, Ethyl (B): <5 mg/dL (ref <5)

## 2015-05-14 LAB — TYPE AND SCREEN
ABO/RH(D): A POS
Antibody Screen: NEGATIVE

## 2015-05-14 LAB — PROTIME-INR
INR: 1.32
PROTHROMBIN TIME: 16.6 s — AB (ref 11.4–15.0)

## 2015-05-14 LAB — ABO/RH: ABO/RH(D): A POS

## 2015-05-14 LAB — TROPONIN I: Troponin I: <0.03 ng/mL (ref <0.031)

## 2015-05-14 LAB — LIPASE, BLOOD: Lipase: 64 U/L — ABNORMAL HIGH (ref 11–51)

## 2015-05-14 MED ORDER — SODIUM CHLORIDE 0.9 % IV BOLUS (SEPSIS)
1000.0000 mL | Freq: Once | INTRAVENOUS | Status: AC
  Administered 2015-05-15: 1000 mL via INTRAVENOUS

## 2015-05-14 MED ORDER — SODIUM CHLORIDE 0.9 % IV BOLUS (SEPSIS)
500.0000 mL | Freq: Once | INTRAVENOUS | Status: AC
  Administered 2015-05-14: 500 mL via INTRAVENOUS

## 2015-05-14 NOTE — ED Notes (Signed)
Chaplain at bedside

## 2015-05-14 NOTE — ED Provider Notes (Addendum)
[facility] Emergency Department Provider Note  ____________________________________________   I have reviewed the triage vital signs and the nursing notes.   HISTORY  Chief Complaint Dizziness    HPI [name] is a 59 y.o. female who presents today complaining of being lightheaded. Patient was seen at her primary care doctor on November 3, she has a history of multiple different medical problems including end-stage liver disease, hypertension, hepatitis C, chronic kidney disease, and drug-seeking behavior among others. The patient notes from [facility] primary care says that she went to see her internal medicine doctor and tried multiple times to get narcotic pain medication from them but they stated that she had a pain contract and could not get it. She then went to [facility] ER 2 days ago and again asked for narcotic pain medications but will times as there is still several days before she can get to see her pain doctor. She had a small abscess in her right axilla that was I&D. The patient states that today she feels lightheaded and her chronic abdominal pain is present but not worse. She denies any fever or chills. She denies chest pain or shortness of breath she denies dysuria or urinary frequency she denies headache. She denies passing out but she states she nearly did, she states she fell on her bottom a few times today. She denies injury. She is able to ambulate. She denies headache. She states she feels lightheaded without any true vertigo or neurologic focal deficits.  Past Medical History  Diagnosis Date  . Emphysema of lung ([place])   . Depression   . Diverticulitis   . Chronic bronchitis ([place])   . Malignant brain tumor ([place]) 2007  . GERD (gastroesophageal reflux disease)   . Allergy   . Hepatitis C   . Hypertension   . Chronic kidney disease   . Migraines   . Urine incontinence   . Seizures ([place])   . Stroke [facility]) 2007    during brain surgery  .  Pancreatitis, alcoholic 9147  . Rectovaginal fistula   . H/O ETOH abuse     Sober since 2009  . H/O drug abuse     Clean since 2009  . Pancreatic ascites   . Cirrhosis [facility])     Patient Active Problem List   Diagnosis Date Noted  . ALC (alcoholic liver cirrhosis) ([place]) 12/23/2014  . Clinical depression 12/23/2014  . Anxiety 12/23/2014  . Diabetes mellitus, type 2 ([place]) 12/23/2014  . HTN (hypertension) 11/05/2014  . Liver cirrhosis ([place]) 07/19/2014  . 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 Chronic pancreatitis ([place]) 11/03/2012  . Seizure disorder ([place]) 11/03/2012  . Benign meningioma of brain ([place]) 11/03/2012  . Screening for breast cancer 11/03/2012  . Chronic pelvic pain in female 11/03/2012  . Hepatitis C virus infection without hepatic coma 11/03/2012  . UTI (urinary tract infection) 11/03/2012  . Alcohol abuse, unspecified 11/03/2012  . Chronic liver disease 11/03/2012  . Schizophrenia ([place]) 11/03/2012  . Narcotic abuse 11/03/2012  . 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 Breast screening 11/03/2012  . 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 Nondependent alcohol abuse 11/03/2012  . 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 Female genital symptoms 11/03/2012  . Infection of urinary tract 11/03/2012    Past Surgical History  Procedure Laterality Date  . Appendectomy  1999  . Eye surgery    . Rectovaginal fistula repair w/ colostomy  2011  . Brain  surgery  2007    malignant brain tumor  . Cholecystectomy  2001  . Abdominal hysterectomy  1979    Current Outpatient Rx  Name  Route  Sig  Dispense  Refill  . ADVAIR DISKUS 500-50 MCG/DOSE AEPB      inhale 1 dose by mouth twice a day      0     Dispense as written.   Marland Kitchen albuterol (PROAIR HFA) 108 (90 BASE) MCG/ACT inhaler               . ALPRAZolam (XANAX) 1 MG tablet   Oral   Take 1 tablet (1 mg total) by mouth 2 (two) times daily.   120  tablet   0   . amitriptyline (ELAVIL) 50 MG tablet   Oral   Take 1 tablet (50 mg total) by mouth at bedtime.   30 tablet   3   . amLODipine (NORVASC) 10 MG tablet   Oral   Take 1 tablet (10 mg total) by mouth daily.   30 tablet   4   . busPIRone (BUSPAR) 10 MG tablet   Oral   Take 10 mg by mouth.         . cefdinir (OMNICEF) 300 MG capsule   Oral   Take 300 mg by mouth 2 (two) times daily.         . cephALEXin (KEFLEX) 500 MG capsule   Oral   Take 1 capsule (500 mg total) by mouth 3 (three) times daily.   36 capsule   0   . clobetasol cream (TEMOVATE) 0.05 %               . CREON 12000 UNITS CPEP   Oral   Take 2 capsules by mouth 3 (three) times daily before meals.          . docusate sodium (STOOL SOFTENER) 100 MG capsule   Oral   Take 100 mg by mouth.         . escitalopram (LEXAPRO) 10 MG tablet   Oral   Take 1 tablet (10 mg total) by mouth daily.   30 tablet   4   . fesoterodine (TOVIAZ) 8 MG TB24 tablet   Oral   Take 8 mg by mouth.         . furosemide (LASIX) 20 MG tablet   Oral   Take 20 mg by mouth daily.         Marland Kitchen gabapentin (NEURONTIN) 300 MG capsule   Oral   Take 300 mg by mouth 3 (three) times daily.          . hydrOXYzine (ATARAX/VISTARIL) 25 MG tablet   Oral   Take 25 mg by mouth.         . Ipratropium-Albuterol (COMBIVENT) 20-100 MCG/ACT AERS respimat   Inhalation   Inhale into the lungs.         . lactulose (CHRONULAC) 10 GM/15ML solution   Oral   Take 20 g by mouth daily as needed for mild constipation.          . Ledipasvir-Sofosbuvir (HARVONI) 90-400 MG TABS   Oral   Take 1 tablet by mouth daily.   28 tablet   2   . levETIRAcetam (KEPPRA) 500 MG tablet   Oral   Take 1 tablet (500 mg total) by mouth 2 (two) times daily.   60 tablet   3   . levofloxacin (LEVAQUIN) 500 MG tablet   Oral  Take 500 mg by mouth.         . lidocaine (XYLOCAINE) 2 % jelly   Topical   Apply 1 application  topically as needed.   30 mL   2   . Melatonin 3 MG TABS   Oral   Take 10 mg by mouth.         . metFORMIN (GLUCOPHAGE) 1000 MG tablet   Oral   Take 1,000 mg by mouth 2 (two) times daily.         . metoCLOPramide (REGLAN) 10 MG tablet   Oral   Take by mouth.         . metoCLOPramide (REGLAN) 10 MG tablet   Oral   Take 1 tablet (10 mg total) by mouth 3 (three) times daily with meals.   15 tablet   0   . metoprolol succinate (TOPROL-XL) 25 MG 24 hr tablet   Oral   Take 1 tablet (25 mg total) by mouth daily.   30 tablet   4   . Misc Natural Products (HORNY GOAT WEED) CAPS   Oral   Take 1 capsule by mouth.         Marland Kitchen omeprazole (PRILOSEC) 20 MG capsule   Oral   Take 1 capsule (20 mg total) by mouth daily.   30 capsule   11     Please stop all of the other PPIs if on profile   . ondansetron (ZOFRAN) 8 MG tablet   Oral   Take by mouth.         . oxyCODONE (ROXICODONE) 15 MG immediate release tablet   Oral   Take 1.5 tablets (22.5 mg total) by mouth every 6 (six) hours as needed for pain. Patient not taking: Reported on 04/18/2015   180 tablet   0   . Oxycodone HCl 20 MG TABS   Oral   Take 1 tablet by mouth every 8 (eight) hours as needed.      0   . PARoxetine (PAXIL) 20 MG tablet   Oral   Take 20 mg by mouth.         . pravastatin (PRAVACHOL) 10 MG tablet               . predniSONE (DELTASONE) 5 MG tablet               . promethazine (PHENERGAN) 25 MG tablet   Oral   Take 25 mg by mouth.         . QUEtiapine (SEROQUEL) 100 MG tablet   Oral   Take 3.5 tablets (350 mg total) by mouth daily.   105 tablet   4   . ranitidine (ZANTAC) 150 MG capsule   Oral   Take 150 mg by mouth once.         . saxagliptin HCl (ONGLYZA) 2.5 MG TABS tablet   Oral   Take 2.5 mg by mouth.         . sertraline (ZOLOFT) 50 MG tablet   Oral   Take 50 mg by mouth daily.         . solifenacin (VESICARE) 10 MG tablet   Oral   Take 10 mg  by mouth.         . temazepam (RESTORIL) 30 MG capsule   Oral   Take 30 mg by mouth at bedtime.         Marland Kitchen terconazole (TERAZOL 7) 0.4 % vaginal cream   Vaginal  Place 1 applicator vaginally at bedtime.   45 g   0   . tiotropium (SPIRIVA) 18 MCG inhalation capsule   Inhalation   Place 18 mcg into inhaler and inhale 2 (two) times daily as needed (shortness of breath).          . traMADol (ULTRAM) 50 MG tablet               . triamcinolone (NASACORT ALLERGY 24HR) 55 MCG/ACT AERO nasal inhaler   Nasal   Place 2 sprays into the nose daily as needed (allergies).         Marland Kitchen Umeclidinium-Vilanterol 62.5-25 MCG/INH AEPB   Inhalation   Inhale 1 puff into the lungs daily as needed (for shortness of breath).         . zolpidem (AMBIEN) 10 MG tablet   Oral   Take 1 tablet (10 mg total) by mouth at bedtime.   30 tablet   3     Allergies Ciprofloxacin; Dilaudid; Hydromorphone; Ibuprofen; Sulfa antibiotics; Zofran; Zofran; Amoxicillin; Chocolate; Fentanyl; Penicillins; and Strawberry extract  Family History  Problem Relation Age of Onset  . Hypertension Mother   . Heart disease Father   . Diabetes Father   . Cancer Sister     brain  . Cancer Grandchild 8    brain tumor    Social History Social History  Substance Use Topics  . Smoking status: Current Some Day Smoker -- 0.25 packs/day    Types: Cigarettes  . Smokeless tobacco: Never Used     Comment: cutting back  . Alcohol Use: No     Comment: no alcohol since 2013    Review of Systems Constitutional: No fever/chills Eyes: No visual changes. ENT: No sore throat. No stiff neck no neck pain Cardiovascular: Denies chest pain. Respiratory: Denies shortness of breath. Gastrointestinal:   Patient states she had some nonbloody nonbilious vomiting.  No diarrhea.  No constipation. Genitourinary: Negative for dysuria. Musculoskeletal: Negative lower extremity swelling Skin: Negative for rash. Neurological:  Negative for headaches, focal weakness or numbness. 10-point ROS otherwise negative.  ____________________________________________   PHYSICAL EXAM:  VITAL SIGNS: ED Triage Vitals  Enc Vitals Group     BP 05/14/15 2114 107/76 mmHg     Pulse Rate 05/14/15 2114 93     Resp 05/14/15 2114 16     Temp 05/14/15 2114 98.7 F (37.1 C)     Temp Source 05/14/15 2114 Oral     SpO2 05/14/15 2114 99 %     Weight 05/14/15 2114 160 lb (72.576 kg)     Height 05/14/15 2114 5\' 8"  (1.727 m)     Head Cir --      Peak Flow --      Pain Score 05/14/15 2122 8     Pain Loc --      Pain Edu? --      Excl. in [place]? --     Constitutional: Alert and oriented. She is in no acute medical distress Eyes: Conjunctivae are normal. PERRL. EOMI. Head: Atraumatic. Nose: No congestion/rhinnorhea. Mouth/Throat: Mucous membranes are very dry.  Oropharynx non-erythematous. Neck: No stridor.   Nontender with no meningismus Cardiovascular: Normal rate, regular rhythm. Grossly normal heart sounds.  Good peripheral circulation. Respiratory: Normal respiratory effort.  No retractions. Lungs CTAB. Abdominal: Orbital obesity noted, no evidence of acute intra-abdominal pathology very distractible exam with minimal epigastric discomfort which is chronic there is no guarding or rebound Back:  There is no focal tenderness or step  off there is no midline tenderness there are no lesions noted. there is no CVA tenderness Musculoskeletal: No lower extremity tenderness. No joint effusions, no DVT signs strong distal pulses no edema Neurologic:  Normal speech and language. No gross focal neurologic deficits are appreciated.  Skin:  Skin is warm, dry and intact. No rash noted. Axilla does not appear to have an abscess at this time incision is clean dry and intact Psychiatric: Mood and affect are normal. Speech and behavior are normal.  ____________________________________________   LABS (all labs ordered are listed, but only  abnormal results are displayed)  Labs Reviewed  CBC WITH DIFFERENTIAL/PLATELET - Abnormal; Notable for the following:    Hemoglobin 10.0 (*)    HCT 31.6 (*)    MCV 78.0 (*)    MCH 24.6 (*)    MCHC 31.6 (*)    RDW 19.6 (*)    Monocytes Absolute 1.2 (*)    All other components within normal limits  COMPREHENSIVE METABOLIC PANEL - Abnormal; Notable for the following:    Glucose, Bld 140 (*)    Creatinine, Ser 1.05 (*)    Calcium 8.8 (*)    Albumin 3.3 (*)    AST 67 (*)    Alkaline Phosphatase 176 (*)    GFR calc non Af Amer 57 (*)    Anion gap 3 (*)    All other components within normal limits  LIPASE, BLOOD - Abnormal; Notable for the following:    Lipase 64 (*)    All other components within normal limits  PROTIME-INR - Abnormal; Notable for the following:    Prothrombin Time 16.6 (*)    All other components within normal limits  URINE CULTURE  TROPONIN I  ETHANOL  URINALYSIS COMPLETEWITH MICROSCOPIC ([facility] ONLY)  TYPE AND SCREEN  ABO/RH   ____________________________________________  EKG  I personally interpreted any EKGs ordered by me or triage Normal sinus rhythm rate 93. Beats per minute normal axis, no acute ST elevation or depression ____________________________________________  RADIOLOGY  I reviewed any imaging ordered by me or triage that were performed during my shift ____________________________________________   PROCEDURES  Procedure(s) performed: None  Critical Care performed: None  ____________________________________________   INITIAL IMPRESSION / ASSESSMENT AND PLAN / ED COURSE  Pertinent labs & imaging results that were available during my care of the patient were reviewed by me and considered in my medical decision making (see chart for details).  Patient here for feeling lightheaded. She does appear to be mildly dehydrated. Her giving her ginger IV fluid as I am concerned that she will just third spacing. She apparently does have a  history of ascites. CBC is normal for her. Hemoglobin is stable. Do not see evidence of a recent GI bleed hospitalization although she states she has one and certainly right now her hemoglobin is 10 and 3 weeks ago was 9.7 so and any event there is no evidence of ongoing or active bleeding. Platelet count is 179 which is remarkable for a patient with liver disease. Electrolytes are reassuring renal function is reassuring although slightly above baseline at 1.05 it is not dangerous. We are giving her IV fluid although not copious amounts given the concern for possible third spacing. Chest x-ray is pending EKG is normal. We will defer giving narcotic pain medication. The patient is in no acute medical distress at this time. We will hydrate her and reevaluate. Urinalysis and chest x-ray are pending. ____________________________________________   ----------------------------------------- 11:17 PM on 05/14/2015 -----------------------------------------  When I informed the patient she was not going to get her chronic pain medication here, she denies that she felt much better and she wanted to go home right away. Patient initially declined to take further IV fluid hydration and she was eager to go home. We told her she could go home if she was not orthostatic. We stood her up and she felt lightheaded. Her pressure was in the mid when she stood up 80s, we will therefore continue hydration and likely will need to admit this patient.  FINAL CLINICAL IMPRESSION(S) / ED DIAGNOSES  Final diagnoses:  None     Schuyler Amor, MD 05/14/15 2252  Schuyler Amor, MD 05/14/15 2318  Schuyler Amor, MD 05/14/15 [id number]  Schuyler Amor, MD 05/14/15 2330

## 2015-05-14 NOTE — ED Notes (Signed)
MD made aware of orthostatic VS and pt stating "I feel more dizzy."

## 2015-05-14 NOTE — ED Notes (Signed)
Patient requests chaplain service. Will make Charge RN aware.

## 2015-05-14 NOTE — Progress Notes (Signed)
   05/14/15 2123  Clinical Encounter Type  Visited With Patient  Visit Type Initial  Referral From Nurse  Spiritual Encounters  Spiritual Needs Prayer  Advance Directives (For Healthcare)  Does patient have an advance directive? No  Would patient like information on creating an advanced directive? No - patient declined information  Engaged patient with support and prayer.

## 2015-05-14 NOTE — ED Notes (Signed)
Pt presents to ED via EMS from personal home with c/o of dizziness and fall episodes since yesterday. EMS states pt is currently on a liver transplant list, with an enlarged liver, spleen, and pancreas. EMS states pt was recently discharged from [facility] for a diagnosis of internal bleeding. Pt arrived to ER alert and oriented x4.

## 2015-05-14 NOTE — ED Notes (Signed)
Assisted patient to in-room toilet. Pt ambulated to area, without any difficulty.

## 2015-05-15 ENCOUNTER — Observation Stay: Payer: Medicare Other

## 2015-05-15 DIAGNOSIS — R42 Dizziness and giddiness: Secondary | ICD-10-CM | POA: Diagnosis not present

## 2015-05-15 DIAGNOSIS — G9340 Encephalopathy, unspecified: Secondary | ICD-10-CM | POA: Diagnosis not present

## 2015-05-15 DIAGNOSIS — R140 Abdominal distension (gaseous): Secondary | ICD-10-CM | POA: Diagnosis not present

## 2015-05-15 DIAGNOSIS — I951 Orthostatic hypotension: Secondary | ICD-10-CM | POA: Diagnosis present

## 2015-05-15 DIAGNOSIS — K7031 Alcoholic cirrhosis of liver with ascites: Secondary | ICD-10-CM | POA: Diagnosis not present

## 2015-05-15 LAB — BASIC METABOLIC PANEL
ANION GAP: 3 — AB (ref 5–15)
BUN: 11 mg/dL (ref 6–20)
CALCIUM: 8.5 mg/dL — AB (ref 8.9–10.3)
CO2: 26 mmol/L (ref 22–32)
Chloride: 109 mmol/L (ref 101–111)
Creatinine, Ser: 0.98 mg/dL (ref 0.44–1.00)
GLUCOSE: 125 mg/dL — AB (ref 65–99)
POTASSIUM: 4.4 mmol/L (ref 3.5–5.1)
SODIUM: 138 mmol/L (ref 135–145)

## 2015-05-15 LAB — URINE DRUG SCREEN, QUALITATIVE (ARMC ONLY)
AMPHETAMINES, UR SCREEN: NOT DETECTED
BENZODIAZEPINE, UR SCRN: POSITIVE — AB
Barbiturates, Ur Screen: NOT DETECTED
Cannabinoid 50 Ng, Ur ~~LOC~~: NOT DETECTED
Cocaine Metabolite,Ur ~~LOC~~: NOT DETECTED
MDMA (ECSTASY) UR SCREEN: NOT DETECTED
METHADONE SCREEN, URINE: NOT DETECTED
Opiate, Ur Screen: POSITIVE — AB
PHENCYCLIDINE (PCP) UR S: NOT DETECTED
TRICYCLIC, UR SCREEN: POSITIVE — AB

## 2015-05-15 LAB — HEMOGLOBIN A1C: Hgb A1c MFr Bld: 5.5 % (ref 4.0–6.0)

## 2015-05-15 LAB — AMMONIA: AMMONIA: 81 umol/L — AB (ref 9–35)

## 2015-05-15 LAB — TSH: TSH: 6.164 u[IU]/mL — ABNORMAL HIGH (ref 0.350–4.500)

## 2015-05-15 MED ORDER — PREDNISONE 5 MG PO TABS
5.0000 mg | ORAL_TABLET | Freq: Every day | ORAL | Status: DC
  Administered 2015-05-15 – 2015-05-16 (×2): 5 mg via ORAL
  Filled 2015-05-15 (×2): qty 1

## 2015-05-15 MED ORDER — DEXTROSE 5 % IV SOLN
1.0000 g | INTRAVENOUS | Status: DC
  Administered 2015-05-15 – 2015-05-16 (×2): 1 g via INTRAVENOUS
  Filled 2015-05-15 (×2): qty 10

## 2015-05-15 MED ORDER — PRAVASTATIN SODIUM 10 MG PO TABS
10.0000 mg | ORAL_TABLET | Freq: Every day | ORAL | Status: DC
  Administered 2015-05-15 – 2015-05-16 (×2): 10 mg via ORAL
  Filled 2015-05-15 (×2): qty 1

## 2015-05-15 MED ORDER — SODIUM CHLORIDE 0.9 % IV SOLN
INTRAVENOUS | Status: DC
  Administered 2015-05-15: 02:00:00 via INTRAVENOUS

## 2015-05-15 MED ORDER — HEPARIN SODIUM (PORCINE) 5000 UNIT/ML IJ SOLN
5000.0000 [IU] | Freq: Three times a day (TID) | INTRAMUSCULAR | Status: DC
  Administered 2015-05-15 – 2015-05-16 (×4): 5000 [IU] via SUBCUTANEOUS
  Filled 2015-05-15 (×4): qty 1

## 2015-05-15 MED ORDER — ALPRAZOLAM 1 MG PO TABS
1.0000 mg | ORAL_TABLET | Freq: Two times a day (BID) | ORAL | Status: DC
  Administered 2015-05-15 (×2): 1 mg via ORAL
  Filled 2015-05-15 (×2): qty 1

## 2015-05-15 MED ORDER — DOCUSATE SODIUM 100 MG PO CAPS
100.0000 mg | ORAL_CAPSULE | Freq: Two times a day (BID) | ORAL | Status: DC
  Administered 2015-05-15 (×3): 100 mg via ORAL
  Filled 2015-05-15 (×4): qty 1

## 2015-05-15 MED ORDER — FLUTICASONE PROPIONATE 50 MCG/ACT NA SUSP: 2.0000 | Freq: Every day | NASAL | Status: DC | PRN

## 2015-05-15 MED ORDER — NALOXONE HCL 0.4 MG/ML IJ SOLN
0.4000 mg | INTRAMUSCULAR | Status: DC | PRN
  Administered 2015-05-15: 0.4 mg via INTRAVENOUS
  Filled 2015-05-15: qty 1

## 2015-05-15 MED ORDER — SERTRALINE HCL 50 MG PO TABS
50.0000 mg | ORAL_TABLET | Freq: Every day | ORAL | Status: DC
  Administered 2015-05-15 – 2015-05-16 (×2): 50 mg via ORAL
  Filled 2015-05-15 (×2): qty 1

## 2015-05-15 MED ORDER — TRIAMCINOLONE ACETONIDE 55 MCG/ACT NA AERO: 2.0000 | INHALATION_SPRAY | Freq: Every day | NASAL | Status: DC | PRN

## 2015-05-15 MED ORDER — TRAMADOL HCL 50 MG PO TABS: 50.0000 mg | ORAL_TABLET | Freq: Every day | ORAL | Status: DC

## 2015-05-15 MED ORDER — AMLODIPINE BESYLATE 10 MG PO TABS
10.0000 mg | ORAL_TABLET | Freq: Every day | ORAL | Status: DC
Start: 2015-05-15 — End: 2015-05-15

## 2015-05-15 MED ORDER — LEVETIRACETAM 500 MG PO TABS
500.0000 mg | ORAL_TABLET | Freq: Two times a day (BID) | ORAL | Status: DC
  Administered 2015-05-15 – 2015-05-16 (×3): 500 mg via ORAL
  Filled 2015-05-15 (×3): qty 1

## 2015-05-15 MED ORDER — FUROSEMIDE 20 MG PO TABS: 20.0000 mg | ORAL_TABLET | Freq: Every day | ORAL | Status: DC

## 2015-05-15 MED ORDER — ZOLPIDEM TARTRATE 5 MG PO TABS
10.0000 mg | ORAL_TABLET | Freq: Every day | ORAL | Status: DC
Start: 2015-05-15 — End: 2015-05-15

## 2015-05-15 MED ORDER — AMITRIPTYLINE HCL 50 MG PO TABS: 50.0000 mg | ORAL_TABLET | Freq: Every day | ORAL | Status: DC

## 2015-05-15 MED ORDER — PANCRELIPASE (LIP-PROT-AMYL) 12000-38000 UNITS PO CPEP
24000.0000 [IU] | ORAL_CAPSULE | Freq: Three times a day (TID) | ORAL | Status: DC
  Administered 2015-05-15 – 2015-05-16 (×3): 24000 [IU] via ORAL
  Filled 2015-05-15 (×3): qty 2

## 2015-05-15 MED ORDER — FUROSEMIDE 20 MG PO TABS
20.0000 mg | ORAL_TABLET | Freq: Every day | ORAL | Status: DC
  Administered 2015-05-16: 20 mg via ORAL
  Filled 2015-05-15 (×2): qty 1

## 2015-05-15 MED ORDER — ALPRAZOLAM 0.5 MG PO TABS: 0.5000 mg | ORAL_TABLET | Freq: Two times a day (BID) | ORAL | Status: DC

## 2015-05-15 MED ORDER — PROMETHAZINE HCL 25 MG PO TABS
12.5000 mg | ORAL_TABLET | Freq: Four times a day (QID) | ORAL | Status: DC | PRN
  Administered 2015-05-16: 12.5 mg via ORAL
  Filled 2015-05-15: qty 1

## 2015-05-15 MED ORDER — OXYCODONE HCL 5 MG PO TABS
20.0000 mg | ORAL_TABLET | Freq: Three times a day (TID) | ORAL | Status: DC | PRN
  Administered 2015-05-15: 20 mg via ORAL
  Filled 2015-05-15: qty 4

## 2015-05-15 MED ORDER — MOMETASONE FURO-FORMOTEROL FUM 200-5 MCG/ACT IN AERO
2.0000 | INHALATION_SPRAY | Freq: Two times a day (BID) | RESPIRATORY_TRACT | Status: DC
  Administered 2015-05-15 – 2015-05-16 (×3): 2 via RESPIRATORY_TRACT
  Filled 2015-05-15: qty 8.8

## 2015-05-15 MED ORDER — PROPRANOLOL HCL 20 MG PO TABS: 10.0000 mg | ORAL_TABLET | Freq: Two times a day (BID) | ORAL | Status: DC

## 2015-05-15 MED ORDER — METOPROLOL SUCCINATE ER 25 MG PO TB24: 25.0000 mg | ORAL_TABLET | Freq: Every day | ORAL | Status: DC

## 2015-05-15 MED ORDER — PANTOPRAZOLE SODIUM 40 MG PO TBEC
40.0000 mg | DELAYED_RELEASE_TABLET | Freq: Every day | ORAL | Status: DC
Start: 2015-05-15 — End: 2015-05-16
  Administered 2015-05-15 – 2015-05-16 (×2): 40 mg via ORAL
  Filled 2015-05-15 (×2): qty 1

## 2015-05-15 MED ORDER — OXYCODONE HCL 5 MG PO TABS: 10.0000 mg | ORAL_TABLET | Freq: Three times a day (TID) | ORAL | Status: DC | PRN

## 2015-05-15 MED ORDER — TIOTROPIUM BROMIDE MONOHYDRATE 18 MCG IN CAPS: 18.0000 ug | ORAL_CAPSULE | Freq: Two times a day (BID) | RESPIRATORY_TRACT | Status: DC | PRN

## 2015-05-15 MED ORDER — LACTULOSE 10 GM/15ML PO SOLN
20.0000 g | Freq: Two times a day (BID) | ORAL | Status: DC
  Administered 2015-05-15: 20 g via ORAL
  Filled 2015-05-15: qty 30

## 2015-05-15 MED ORDER — IPRATROPIUM BROMIDE 0.02 % IN SOLN: 0.5000 mg | Freq: Two times a day (BID) | RESPIRATORY_TRACT | Status: DC | PRN

## 2015-05-15 MED ORDER — ALBUTEROL SULFATE (2.5 MG/3ML) 0.083% IN NEBU: 3.0000 mL | INHALATION_SOLUTION | RESPIRATORY_TRACT | Status: DC | PRN

## 2015-05-15 MED ORDER — LACTULOSE 10 GM/15ML PO SOLN: 20.0000 g | Freq: Every day | ORAL | Status: DC | PRN

## 2015-05-15 MED ORDER — MORPHINE SULFATE ER 15 MG PO TBCR
15.0000 mg | EXTENDED_RELEASE_TABLET | Freq: Two times a day (BID) | ORAL | Status: DC
  Administered 2015-05-15 (×2): 15 mg via ORAL
  Filled 2015-05-15 (×2): qty 1

## 2015-05-15 MED ORDER — LACTULOSE 10 GM/15ML PO SOLN
20.0000 g | Freq: Three times a day (TID) | ORAL | Status: DC
  Administered 2015-05-15 – 2015-05-16 (×3): 20 g via ORAL
  Filled 2015-05-15 (×3): qty 30

## 2015-05-15 MED ORDER — DOXYCYCLINE HYCLATE 100 MG PO TABS
100.0000 mg | ORAL_TABLET | Freq: Two times a day (BID) | ORAL | Status: DC
  Administered 2015-05-15: 100 mg via ORAL
  Filled 2015-05-15: qty 1

## 2015-05-15 MED ORDER — GABAPENTIN 300 MG PO CAPS
300.0000 mg | ORAL_CAPSULE | Freq: Three times a day (TID) | ORAL | Status: DC
  Administered 2015-05-15: 300 mg via ORAL
  Filled 2015-05-15: qty 1

## 2015-05-15 MED ORDER — DIPHENHYDRAMINE HCL 50 MG/ML IJ SOLN
12.5000 mg | Freq: Four times a day (QID) | INTRAMUSCULAR | Status: DC | PRN
  Filled 2015-05-15: qty 0.25

## 2015-05-15 MED ORDER — MORPHINE SULFATE (PF) 4 MG/ML IV SOLN
4.0000 mg | Freq: Once | INTRAVENOUS | Status: AC
  Administered 2015-05-15: 4 mg via INTRAVENOUS
  Filled 2015-05-15: qty 1

## 2015-05-15 NOTE — Progress Notes (Signed)
Confused at times. Ammonia level 81. Room air. NSR. Mom at the bedside. Takes meds ok. Up to [facility]. Ortho VS neg. Mom was updated by MD Mody. Pt reported pain and received meds. Pt has no further concerns at this time.

## 2015-05-15 NOTE — Progress Notes (Signed)
Pt arrived to unit via stretcher, pt is A&O, c/o pain on RUQ, will administer Meds. No skin issues to report, skin is dry and flaky but intact. Witnessed  By Baldo Ash, Therapist, sports.

## 2015-05-15 NOTE — Progress Notes (Signed)
MD Benjie Karvonen was notified that pt was still lethargic. CT abd neg. Mom still concerned but educated about cirrhosis. Still concerned about daughter. MD to call into room to talk to mom.

## 2015-05-15 NOTE — Progress Notes (Signed)
MD Benjie Karvonen was made aware that pt ammonia level was 81. Laculose was changed to tid

## 2015-05-15 NOTE — Progress Notes (Signed)
   05/14/15 2319  Orthostatic Lying   BP- Lying 96/63 mmHg  Pulse- Lying 87  Orthostatic Sitting  BP- Sitting (!) 89/61 mmHg  Pulse- Sitting 88  Orthostatic Standing at 0 minutes  BP- Standing at 0 minutes (!) 80/66 mmHg  Pulse- Standing at 0 minutes 84      05/15/15 0801  Orthostatic Lying   BP- Lying 92/70 mmHg  Pulse- Lying 69  Orthostatic Sitting  BP- Sitting 92/60 mmHg  Pulse- Sitting 85  Orthostatic Standing at 0 minutes  BP- Standing at 0 minutes (!) 79/51 mmHg  Pulse- Standing at 0 minutes 88    Patient with symptomatic orthostasis

## 2015-05-15 NOTE — Evaluation (Signed)
Physical Therapy Evaluation Patient Details Name: [name] MRN: [id number] DOB: 10-11-1955 Today's Date: 05/15/2015   History of Present Illness  Patient is a 59 y/o female that presents with dizziness, light-headedness and abdominal pain. Patient noted to have a brain tumor removed several years previously, seizures, and schizophrenia. Noted to have orthostatic hypotension on this admission.   Clinical Impression  Patient is rather lethargic throughout this evaluation, though she does provide appropriate participation. She reports feeling dizzy upon initially standing, she had been orthostatic during this admission however not so on this evaluation. Patient is very soft spoken and does not provide a thorough history, however from what PT could gather she appears to be slightly below her baseline mobility. She is noted to have minimal DF bilaterally (less on the left) and compensates via ER at the hips and wide base of support. No loss of balance or assistance with transfers noted, however significantly decreased gait speed indicative of a falls risk. Patient would benefit from skilled PT services to address her mobility deficits.     Follow Up Recommendations Home health PT    Equipment Recommendations  Rolling walker with 5" wheels    Recommendations for Other Services       Precautions / Restrictions Precautions Precautions: Fall Precaution Comments: Orthostatic precautions Restrictions Weight Bearing Restrictions: No      Mobility  Bed Mobility Overal bed mobility: Modified Independent             General bed mobility comments: Patient transfers with [facility] elevated and minimal use of bed rails.   Transfers Overall transfer level: Needs assistance Equipment used: Rolling walker (2 wheeled) Transfers: Sit to/from Stand Sit to Stand: Supervision;Min guard         General transfer comment: Patient is able to transfer from toilet with bed rail and RW no loss of  balance. She is able to transfer from the side of the bed with RW and cga x 1  Ambulation/Gait Ambulation/Gait assistance: Supervision Ambulation Distance (Feet): 120 Feet Assistive device: Rolling walker (2 wheeled) Gait Pattern/deviations: Decreased stride length;Decreased dorsiflexion - right;Decreased dorsiflexion - left;Decreased step length - right;Decreased step length - left;Step-through pattern;Wide base of support;Trunk flexed   Gait velocity interpretation: <1.8 ft/sec, indicative of risk for recurrent falls General Gait Details: Patient ambulates with ER at both hips secondary to decreased DF, minimal step length noted. No loss of balance noted during ambulation, she maintains her head looking at the floor and is unable to safely navigate without hitting objects on the floor.  Stairs            Wheelchair Mobility    Modified Rankin (Stroke Patients Only)       Balance Overall balance assessment: Needs assistance Sitting-balance support: No upper extremity supported Sitting balance-Leahy Scale: Good Sitting balance - Comments: No balance deficits noted.    Standing balance support: Bilateral upper extremity supported Standing balance-Leahy Scale: Fair Standing balance comment: Modified DGI with RW score of 7/12 indicating she is at very high risk of falling.                              Pertinent Vitals/Pain Pain Assessment:  (Patient reports her lower back began hurting during ambulation, discontinued ambulation shortly after.)    Home Living Family/patient expects to be discharged to:: Private residence Living Arrangements: Spouse/significant other               Additional  Comments: Patient did not provide any additional details aside from what is listed above.     Prior Function Level of Independence: Independent with assistive device(s)         Comments: Patient initially states she does not use RW, however when attempting to stand  she states she normally does use a RW.      Hand Dominance        Extremity/Trunk Assessment   Upper Extremity Assessment: Overall WFL for tasks assessed           Lower Extremity Assessment:  (Difficult to assess due to lethargy. Appeared WFL? LLE knee extensor weakness noted, though this could be from effort.)         Communication   Communication:  (Very soft spoken. )  Cognition Arousal/Alertness: Lethargic Behavior During Therapy: Flat affect Overall Cognitive Status: History of cognitive impairments - at baseline (Appropriate responses, though all answers were short)                      General Comments      Exercises Other Exercises Other Exercises: Assisted patient with toileting transfer.       Assessment/Plan    PT Assessment Patient needs continued PT services  PT Diagnosis Difficulty walking;Generalized weakness;Abnormality of gait   PT Problem List Decreased strength;Decreased mobility;Decreased safety awareness;Decreased activity tolerance;Cardiopulmonary status limiting activity;Decreased knowledge of use of DME  PT Treatment Interventions DME instruction;Therapeutic activities;Therapeutic exercise;Gait training;Balance training   PT Goals (Current goals can be found in the Care Plan section) Acute Rehab PT Goals Patient Stated Goal: To return home  PT Goal Formulation: With patient Time For Goal Achievement: 05/29/15 Potential to Achieve Goals: Fair    Frequency Min 2X/week   Barriers to discharge Decreased caregiver support Unable to determine due to poor history provided by patient.     Co-evaluation               End of Session Equipment Utilized During Treatment: Gait belt Activity Tolerance: Patient tolerated treatment well Patient left: in chair;with chair alarm set;with call bell/phone within reach Nurse Communication: Mobility status (Orthostatic values)    Functional Assessment Tool Used: Modified DGI, Clinical  judgement  Functional Limitation: Mobility: Walking and moving around Mobility: Walking and Moving Around Current Status (Q9476): At least 20 percent but less than 40 percent impaired, limited or restricted Mobility: Walking and Moving Around Goal Status [phone]): At least 20 percent but less than 40 percent impaired, limited or restricted    Time: 0930-0959 PT Time Calculation (min) (ACUTE ONLY): 29 min   Charges:   PT Evaluation $Initial PT Evaluation Tier I: 1 Procedure PT Treatments $Therapeutic Activity: 8-22 mins   PT G Codes:   PT G-Codes **NOT FOR INPATIENT CLASS** Functional Assessment Tool Used: Modified DGI, Clinical judgement  Functional Limitation: Mobility: Walking and moving around Mobility: Walking and Moving Around Current Status (P5465): At least 20 percent but less than 40 percent impaired, limited or restricted Mobility: Walking and Moving Around Goal Status [phone]): At least 20 percent but less than 40 percent impaired, limited or restricted   Kerman Passey, PT, DPT    05/15/2015, 12:29 PM

## 2015-05-15 NOTE — Progress Notes (Signed)
MD Benjie Karvonen was made aware that Narcan did not work. Will monitor.

## 2015-05-15 NOTE — Progress Notes (Signed)
MD Benjie Karvonen was made aware that pt is very lethargic. Mom requests that we do not give Narcan. Mom talked to pt. Will monitor.

## 2015-05-15 NOTE — Care Management Obs Status (Signed)
[facility] NOTIFICATION   Patient Details  Name: [name] MRN: [id number] Date of Birth: Nov 23, 1955   Medicare Observation Status Notification Given:  Yes   Patient admitted under observation.  Presented and explained observation notice.  Patient's  signed.  Original given to patient and copy placed in the medical record  Katrina Stack, RN 05/15/2015, 8:41 AM

## 2015-05-15 NOTE — Care Management (Signed)
Patient presents from home with dizziness.   Admitted uner observaton.  She saw PCP at [facility] last week and  it is reported that  she was requesting pain meds and was not able to receive pain meds due to a pain contract.    Patient is orthostatic.  History of cirrhosis, hep C and brain tumor.   CM spoke with patient earlier this day and she was not as drowsy as she appears now.  Narcan has been ordered and an ammonia level.  Unsure if this is reaction to polypharmacy

## 2015-05-15 NOTE — H&P (Signed)
[name] is an 59 y.o. female.   Chief Complaint: Lightheadedness HPI: This patient with multiple medical problems including cirrhosis of the liver presents to the emergency department complaining of lightheadedness and abdominal pain. She has seen multiple providers in the last few days complaining of the same. There was concern she may be narcotic seeking on her last emergency department visit in [facility] Laboratory evaluation here revealed mild acute kidney injury. The patient was being prepared for discharge when she was found to be orthostatic and too weak to walk. She was given 1.5 L total saline emergency department. Due to her continued complaints of lightheadedness and abdominal pain emergency department staff called for admission.  Past Medical History  Diagnosis Date  . [place] lung ([place]   . Depression   . Diverticulitis   . Chronic bronchitis ([place])   . Malignant brain tumor ([place]) 2007  . GERD (gastroesophageal reflux disease)   . Allergy   . Hepatitis C   . Hypertension   . Chronic kidney disease   . Migraines   . Urine incontinence   . Seizures ([place])   . Stroke [facility]) 2007    during brain surgery  . Pancreatitis, alcoholic 9924  . Rectovaginal fistula   . H/O ETOH abuse     Sober since 2009  . H/O drug abuse     Clean since 2009  . Pancreatic ascites   . Cirrhosis [facility])     Past Surgical History  Procedure Laterality Date  . Appendectomy  1999  . Eye surgery    . Rectovaginal fistula repair w/ colostomy  2011  . Brain surgery  2007    malignant brain tumor  . Cholecystectomy  2001  . Abdominal hysterectomy  1979    Family History  Problem Relation Age of Onset  . Hypertension Mother   . Heart disease Father   . Diabetes Father   . Cancer Sister     brain  . Cancer Grandchild 8    brain tumor   Social History:  reports that she has been smoking Cigarettes.  She has been smoking about 0.25 packs per day. She has never used smokeless  tobacco. She reports that she does not drink alcohol or use illicit drugs.  Allergies:  Allergies  Allergen Reactions  . Ciprofloxacin Itching  . Dilaudid [Hydromorphone Hcl] Itching  . Hydromorphone Itching  . Ibuprofen Other (See Comments)    Other reaction(s): Other (See Comments) Cannot take because of liver disease Cannot take because of liver disease   . Sulfa Antibiotics Hives  . Zofran [Ondansetron Hcl] Itching    Other reaction(s): Other (See Comments) Do not use; vomiting per pt  . Zofran [Ondansetron] Other (See Comments)    Do not use; vomiting per pt  . Amoxicillin Rash  . Chocolate Rash  . Fentanyl Rash  . Penicillins Rash  . Strawberry Extract Rash    Prior to Admission medications   Medication Sig Start Date End Date Taking? Authorizing Provider  ADVAIR DISKUS 500-50 MCG/DOSE AEPB inhale 1 dose by mouth twice a day 12/07/14  Yes Historical Provider, MD  albuterol (PROAIR HFA) 108 (90 BASE) MCG/ACT inhaler  11/30/14  Yes Historical Provider, MD  ALPRAZolam (XANAX) 1 MG tablet Take 1 tablet (1 mg total) by mouth 2 (two) times daily. 01/11/15  Yes Rubie Maid, MD  amitriptyline (ELAVIL) 50 MG tablet Take 1 tablet (50 mg total) by mouth at bedtime. 12/01/12  Yes Jackolyn Confer, MD  CREON 12000 UNITS CPEP Take 24,000 Units by mouth 3 (three) times daily before meals.  08/11/12  Yes Historical Provider, MD  doxycycline (VIBRAMYCIN) 100 MG capsule Take 100 mg by mouth 2 (two) times daily. 05/11/15 05/21/15 Yes Historical Provider, MD  furosemide (LASIX) 20 MG tablet Take 20 mg by mouth daily.   Yes Historical Provider, MD  gabapentin (NEURONTIN) 300 MG capsule Take 300 mg by mouth 3 (three) times daily.    Yes Historical Provider, MD  Ipratropium-Albuterol (COMBIVENT) 20-100 MCG/ACT AERS respimat Inhale into the lungs.   Yes Historical Provider, MD  lactulose (CHRONULAC) 10 GM/15ML solution Take 20 g by mouth daily as needed for mild constipation.  09/07/12  Yes Historical  Provider, MD  levETIRAcetam (KEPPRA) 500 MG tablet Take 1 tablet (500 mg total) by mouth 2 (two) times daily. 11/03/12  Yes Jackolyn Confer, MD  metoprolol succinate (TOPROL-XL) 25 MG 24 hr tablet Take 1 tablet (25 mg total) by mouth daily. 10/21/12  Yes Raquel Dagoberto Ligas, NP  omeprazole (PRILOSEC) 20 MG capsule Take 1 capsule (20 mg total) by mouth daily. 07/26/14  Yes [name], MD  Oxycodone HCl 20 MG TABS Take 1 tablet by mouth every 8 (eight) hours as needed. 04/09/15  Yes Historical Provider, MD  PARoxetine (PAXIL) 20 MG tablet Take 20 mg by mouth.   Yes Historical Provider, MD  pravastatin (PRAVACHOL) 10 MG tablet Take 10 mg by mouth daily.  12/07/14  Yes Historical Provider, MD  predniSONE (DELTASONE) 5 MG tablet Take 5 mg by mouth daily with breakfast.  01/16/15  Yes Historical Provider, MD  promethazine (PHENERGAN) 25 MG tablet Take 25 mg by mouth.   Yes Historical Provider, MD  propranolol (INDERAL) 10 MG tablet Take 10 mg by mouth 2 (two) times daily. 05/05/15  Yes Historical Provider, MD  sertraline (ZOLOFT) 50 MG tablet Take 50 mg by mouth daily.   Yes Historical Provider, MD  tiotropium (SPIRIVA) 18 MCG inhalation capsule Place 18 mcg into inhaler and inhale 2 (two) times daily as needed (shortness of breath).    Yes Historical Provider, MD  traMADol (ULTRAM) 50 MG tablet Take 50 mg by mouth daily.  11/01/14  Yes Historical Provider, MD  triamcinolone (NASACORT ALLERGY 24HR) 55 MCG/ACT AERO nasal inhaler Place 2 sprays into the nose daily as needed (allergies).   Yes Historical Provider, MD  zolpidem (AMBIEN) 10 MG tablet Take 1 tablet (10 mg total) by mouth at bedtime. 10/21/12  Yes Raquel Dagoberto Ligas, NP  amLODipine (NORVASC) 10 MG tablet Take 1 tablet (10 mg total) by mouth daily. Patient not taking: Reported on 05/14/2015 10/21/12   Raquel M Rey, NP  cephALEXin (KEFLEX) 500 MG capsule Take 1 capsule (500 mg total) by mouth 3 (three) times daily. Patient not taking: Reported on 05/14/2015 02/21/15    Hinda Kehr, MD     Results for orders placed or performed during the hospital encounter of 05/14/15 (from the past 48 hour(s))  Troponin I     Status: None   Collection Time: 05/14/15  9:22 PM  Result Value Ref Range   Troponin I <0.03 <0.031 ng/mL    Comment:        NO INDICATION OF MYOCARDIAL INJURY.   CBC with Differential     Status: Abnormal   Collection Time: 05/14/15  9:22 PM  Result Value Ref Range   WBC 10.1 3.6 - 11.0 K/uL   RBC 4.06 3.80 - 5.20 MIL/uL   Hemoglobin 10.0 (  L) 12.0 - 16.0 g/dL   HCT 31.6 (L) 35.0 - 47.0 %   MCV 78.0 (L) 80.0 - 100.0 fL   MCH 24.6 (L) 26.0 - 34.0 pg   MCHC 31.6 (L) 32.0 - 36.0 g/dL   RDW 19.6 (H) 11.5 - 14.5 %   Platelets 179 150 - 440 K/uL   Neutrophils Relative % 52 %   Neutro Abs 5.3 1.4 - 6.5 K/uL   Lymphocytes Relative 33 %   Lymphs Abs 3.3 1.0 - 3.6 K/uL   Monocytes Relative 12 %   Monocytes Absolute 1.2 (H) 0.2 - 0.9 K/uL   Eosinophils Relative 2 %   Eosinophils Absolute 0.2 0 - 0.7 K/uL   Basophils Relative 1 %   Basophils Absolute 0.1 0 - 0.1 K/uL  Comprehensive metabolic panel     Status: Abnormal   Collection Time: 05/14/15  9:22 PM  Result Value Ref Range   Sodium 137 135 - 145 mmol/L   Potassium 4.0 3.5 - 5.1 mmol/L   Chloride 110 101 - 111 mmol/L   CO2 24 22 - 32 mmol/L   Glucose, Bld 140 (H) 65 - 99 mg/dL   BUN 9 6 - 20 mg/dL   Creatinine, Ser 1.05 (H) 0.44 - 1.00 mg/dL   Calcium 8.8 (L) 8.9 - 10.3 mg/dL   Total Protein 7.7 6.5 - 8.1 g/dL   Albumin 3.3 (L) 3.5 - 5.0 g/dL   AST 67 (H) 15 - 41 U/L   ALT 43 14 - 54 U/L   Alkaline Phosphatase 176 (H) 38 - 126 U/L   Total Bilirubin 1.1 0.3 - 1.2 mg/dL   GFR calc non Af Amer 57 (L) >60 mL/min   GFR calc Af Amer >60 >60 mL/min    Comment: (NOTE) The eGFR has been calculated using the CKD EPI equation. This calculation has not been validated in all clinical situations. eGFR's persistently <60 mL/min signify possible Chronic Kidney Disease.    Anion gap 3  (L) 5 - 15  Lipase, blood     Status: Abnormal   Collection Time: 05/14/15  9:22 PM  Result Value Ref Range   Lipase 64 (H) 11 - 51 U/L  Ethanol     Status: None   Collection Time: 05/14/15  9:22 PM  Result Value Ref Range   Alcohol, Ethyl (B) <5 <5 mg/dL    Comment:        LOWEST DETECTABLE LIMIT FOR SERUM ALCOHOL IS 5 mg/dL FOR MEDICAL PURPOSES ONLY   Protime-INR     Status: Abnormal   Collection Time: 05/14/15  9:22 PM  Result Value Ref Range   Prothrombin Time 16.6 (H) 11.4 - 15.0 seconds   INR 1.32   Type and screen [facility]     Status: None   Collection Time: 05/14/15  9:27 PM  Result Value Ref Range   ABO/RH(D) A POS    Antibody Screen NEG    Sample Expiration 05/17/2015   ABO/Rh     Status: None   Collection Time: 05/14/15  9:28 PM  Result Value Ref Range   ABO/RH(D) A POS   Urinalysis complete, with microscopic     Status: Abnormal   Collection Time: 05/14/15 10:37 PM  Result Value Ref Range   Color, Urine YELLOW (A) YELLOW   APPearance CLOUDY (A) CLEAR   Glucose, UA NEGATIVE NEGATIVE mg/dL   Bilirubin Urine NEGATIVE NEGATIVE   Ketones, ur NEGATIVE NEGATIVE mg/dL   Specific Gravity, Urine  1.011 1.005 - 1.030   Hgb urine dipstick 1+ (A) NEGATIVE   pH 6.0 5.0 - 8.0   Protein, ur NEGATIVE NEGATIVE mg/dL   Nitrite NEGATIVE NEGATIVE   Leukocytes, UA TRACE (A) NEGATIVE   RBC / HPF 6-30 0 - 5 RBC/hpf   WBC, UA 0-5 0 - 5 WBC/hpf   Bacteria, UA RARE (A) NONE SEEN   Squamous Epithelial / LPF 6-30 (A) NONE SEEN   Mucous PRESENT    Budding Yeast PRESENT    Hyaline Casts, UA PRESENT    Dg Chest 2 View  05/14/2015  CLINICAL DATA:  Cough and shortness of breath. Posterior rib pain. Recent diagnosis of pneumonia. EXAM: CHEST  2 VIEW COMPARISON:  Radiographs 04/17/2015 FINDINGS: Clearing of right lower lobe consolidation. No new airspace disease. Cardiomediastinal contours are unchanged, heart at the upper limits normal in size. There is tortuosity  of the thoracic aorta. No pulmonary edema, pleural effusion or pneumothorax. No acute osseous abnormalities are seen. IMPRESSION: Resolution of previous opacity at the right lung base. No no abnormalities are seen. Electronically Signed   By: Jeb Levering M.D.   On: 05/14/2015 22:36    Review of Systems  Constitutional: Negative for fever and chills.  HENT: Negative for sore throat and tinnitus.   Eyes: Negative for blurred vision and redness.  Respiratory: Negative for cough and shortness of breath.   Cardiovascular: Negative for chest pain, palpitations, orthopnea and PND.  Gastrointestinal: Positive for abdominal pain. Negative for nausea, vomiting and diarrhea.  Genitourinary: Negative for dysuria, urgency and frequency.  Musculoskeletal: Negative for myalgias and joint pain.  Skin: Negative for rash.       No lesions  Neurological: Negative for speech change, focal weakness and weakness.  Endo/Heme/Allergies: Does not bruise/bleed easily.       No temperature intolerance  Psychiatric/Behavioral: Negative for depression and suicidal ideas.    Blood pressure 100/70, pulse 88, temperature 98.7 F (37.1 C), temperature source Oral, resp. rate 16, height $RemoveBe'5\' 8"'[id number]$  (1.727 m), weight 72.576 kg (160 lb), SpO2 99 %. Physical Exam  Vitals reviewed. Constitutional: She is oriented to person, place, and time. She appears well-developed and well-nourished. No distress.  HENT:  Head: Normocephalic and atraumatic.  Mouth/Throat: Oropharynx is clear and moist.  Eyes: Conjunctivae and EOM are normal. Pupils are equal, round, and reactive to light. No scleral icterus.  Neck: Normal range of motion. No JVD present. No tracheal deviation present. No thyromegaly present.  Cardiovascular: Normal rate, regular rhythm and normal heart sounds.  Exam reveals no gallop and no friction rub.   No murmur heard. Respiratory: Effort normal and breath sounds normal.  GI: Soft. Bowel sounds are normal. She  exhibits fluid wave and ascites. She exhibits no distension. There is hepatosplenomegaly and splenomegaly. There is no tenderness.  Genitourinary:  Deferred  Musculoskeletal: Normal range of motion. She exhibits edema.  Lymphadenopathy:    She has no cervical adenopathy.  Neurological: She is alert and oriented to person, place, and time. No cranial nerve deficit. She exhibits normal muscle tone.  Skin: Skin is warm and dry. No rash noted. No erythema.  Psychiatric: She has a normal mood and affect. Her behavior is normal. Judgment and thought content normal.     Assessment/Plan This is a [name] admitted for acute kidney injury and abdominal pain. 1. Acute kidney injury: Secondary to intravascular volume depletion. The patient third spaces much of her total body water secondary to loss of oncotic  pressure due to liver failure. Continue to hydrate with intravenous fluid. Avoid nephrotoxic agents. 2. Abdominal pain: Multifactorial secondary to ascites, splenomegaly and chronic pancreatitis. No indication of SBP at this time; continue doxycycline for prophylaxis. We will continue the patient's oral narcotics. I have started her on long-acting oral narcotics as well for pain relief. 3. Cirrhosis: Secondary to alcohol and hepatitis C. Stable; patient reports treatment with Harvoni. Continue propranolol, lactulose, Lasix 4. Hypertension: Continue Norvasc and Toprol 5. Seizure disorder: Continue Keppra 6. COPD: Continue Dulera, Atrovent and redness on 7. Anxiety/depression: Continue Zoloft and amitriptyline 8. Schizophrenia: Will order Haldol if needed for agitation 9. DVT prophylax: Heparin 10. GI prophylaxis: Pantoprazole per home regimen The patient is a full code. Time spent on admission orders and patient care approximately 45 minutes.  Harrie Foreman 05/15/2015, 12:11 AM

## 2015-05-15 NOTE — Progress Notes (Signed)
[facility] [place] NAME: [name]    MR#:  [id number]  DATE OF BIRTH:  04-Mar-1956  SUBJECTIVE:  Patient is sitting up trying TEE breakfast but is very lethargic. Mother at bedside reports the patient was awake and alert yesterday. Patient reports she took Ambien. I spoke with the nurse who says the patient did not take Ambien. Patient is also complaining of abdominal pain and nausea however as per nursing she has been eating.  REVIEW OF SYSTEMS:    Review of Systems  Unable to perform ROS: other    Tolerating Diet: Yes      DRUG ALLERGIES:   Allergies  Allergen Reactions  . Ciprofloxacin Itching  . Dilaudid [Hydromorphone Hcl] Itching  . Hydromorphone Itching  . Ibuprofen Other (See Comments)    Other reaction(s): Other (See Comments) Cannot take because of liver disease Cannot take because of liver disease   . Sulfa Antibiotics Hives  . Zofran [Ondansetron Hcl] Itching    Other reaction(s): Other (See Comments) Do not use; vomiting per pt  . Zofran [Ondansetron] Other (See Comments)    Do not use; vomiting per pt  . Amoxicillin Rash  . Chocolate Rash  . Fentanyl Rash  . Penicillins Rash  . Strawberry Extract Rash    VITALS:  Blood pressure 98/48, pulse 90, temperature 98.2 F (36.8 C), temperature source Oral, resp. rate 18, height 5\' 8"  (1.727 m), weight 117.935 kg (260 lb), SpO2 99 %.  PHYSICAL EXAMINATION:   Physical Exam  Constitutional: She is well-developed, well-nourished, and in no distress. No distress.  HENT:  Head: Normocephalic.  Eyes: No scleral icterus.  Neck: Neck supple. No JVD present. No tracheal deviation present.  Cardiovascular: Normal rate, regular rhythm and normal heart sounds.  Exam reveals no gallop and no friction rub.   No murmur heard. Pulmonary/Chest: Effort normal and breath sounds normal. No respiratory distress. She has no wheezes. She has no rales. She exhibits no  tenderness.  Abdominal: Soft. Bowel sounds are normal. She exhibits no distension and no mass. There is no tenderness. There is no rebound and no guarding.  Musculoskeletal: Normal range of motion. She exhibits no edema.  Neurological:  Patient is sitting up trying the breakfast but overall is lethargic  Skin: Skin is warm. No rash noted. No erythema.      LABORATORY PANEL:   CBC  Recent Labs Lab 05/14/15 2122  WBC 10.1  HGB 10.0*  HCT 31.6*  PLT 179   ------------------------------------------------------------------------------------------------------------------  Chemistries   Recent Labs Lab 05/14/15 2122 05/15/15 1009  NA 137 138  K 4.0 4.4  CL 110 109  CO2 24 26  GLUCOSE 140* 125*  BUN 9 11  CREATININE 1.05* 0.98  CALCIUM 8.8* 8.5*  AST 67*  --   ALT 43  --   ALKPHOS 176*  --   BILITOT 1.1  --    ------------------------------------------------------------------------------------------------------------------  Cardiac Enzymes  Recent Labs Lab 05/14/15 2122  TROPONINI <0.03   ------------------------------------------------------------------------------------------------------------------  RADIOLOGY:  Dg Chest 2 View  05/14/2015  CLINICAL DATA:  Cough and shortness of breath. Posterior rib pain. Recent diagnosis of pneumonia. EXAM: CHEST  2 VIEW COMPARISON:  Radiographs 04/17/2015 FINDINGS: Clearing of right lower lobe consolidation. No new airspace disease. Cardiomediastinal contours are unchanged, heart at the upper limits normal in size. There is tortuosity of the thoracic aorta. No pulmonary edema, pleural effusion or pneumothorax. No acute osseous abnormalities are seen. IMPRESSION: Resolution  of previous opacity at the right lung base. No no abnormalities are seen. Electronically Signed   By: Jeb Levering M.D.   On: 05/14/2015 22:36     ASSESSMENT AND PLAN:   59 year old female with a history of emphysema, essential hypertension, chronic  kidney disease stage III, malignant brain tumor and drug abuse who presented with lightheadedness and found to have orthostatic hypotension.  1. Acute encephalopathy: Patient appears very lethargic this morning it is unclear if this is polypharmacy or related to her underlying cirrhosis. I will order ammonia level. We tried Narcan which did not change her lethargic state. She was also complaining of abdominal pain on admission. I will order abdominal ultrasound to evaluate for ascites. I will empirically start the patient on Rocephin for possible although unlikely SBP. Urine analysis is not indicative of UTI and chest x-ray shows no evidence of pneumonia. I have ordered ammonia level and Keppra level. I have discontinued several of her sedatives. I have also decreased the dose of her Xanax and chronic pain are chronic.   2. Orthostatic hypotension: This is improved with IV fluids. Given her history of liver cirrhosis I will decrease IV fluids.  3. Liver cirrhosis: This is secondary to EtOH and hepatitis C. Due to low blood pressure propranolol has been discontinued. Continue Lasix. She is on doxycycline at home for SBP prophylaxis. I will discontinue this since I am putting the patient on Rocephin.  4. Essential hypertension: Due to low blood pressure have discontinued her hypertensive medications and we'll continue monitor blood pressure.  5. COPD: Patient has no signs of exacerbation. Continue inhalers.  6. Anxiety/depression: Continue Zoloft. Stop amitriptyline due to problem #1 for now.    Management plans discussed with the patient's mother and she is in agreement.  CODE STATUS: FULL  TOTAL TIME TAKING CARE OF THIS PATIENT: 30 minutes.     POSSIBLE D/C 2-3 days, DEPENDING ON CLINICAL CONDITION.   Mariamawit Depaoli M.D on 05/15/2015 at 11:58 AM  Between 7am to 6pm - Pager - [phone] After 6pm go to [email or web address] - password [email or web address] [facility]  Office   [phone]  CC: Primary care physician; Larey Days, MD  Note: This dictation was prepared with Dragon dictation along with smaller phrase technology. Any transcriptional errors that result from this process are unintentional.

## 2015-05-16 DIAGNOSIS — R42 Dizziness and giddiness: Secondary | ICD-10-CM | POA: Diagnosis not present

## 2015-05-16 DIAGNOSIS — I951 Orthostatic hypotension: Secondary | ICD-10-CM | POA: Diagnosis not present

## 2015-05-16 LAB — URINE CULTURE

## 2015-05-16 LAB — BASIC METABOLIC PANEL
ANION GAP: 3 — AB (ref 5–15)
BUN: 13 mg/dL (ref 6–20)
CALCIUM: 8.6 mg/dL — AB (ref 8.9–10.3)
CO2: 26 mmol/L (ref 22–32)
Chloride: 111 mmol/L (ref 101–111)
Creatinine, Ser: 0.93 mg/dL (ref 0.44–1.00)
Glucose, Bld: 90 mg/dL (ref 65–99)
Potassium: 4 mmol/L (ref 3.5–5.1)
Sodium: 140 mmol/L (ref 135–145)

## 2015-05-16 LAB — LEVETIRACETAM LEVEL: LEVETIRACETAM: 18.9 ug/mL (ref 10.0–40.0)

## 2015-05-16 NOTE — Discharge Instructions (Signed)

## 2015-05-16 NOTE — Progress Notes (Signed)
Pt. Discharged to home with HHPT via wc. Pt supposed to have walker delivered to her but reports she already has one at home and does not need one from [place]. Discharge instructions and medication regimen reviewed at bedside with patient. Pt. verbalizes understanding of instructions and medication regimen. Patient assessment unchanged from this morning. TELE and IV discontinued per policy.

## 2015-05-16 NOTE — Progress Notes (Signed)
Patient complaining of swollen spleen and pancreas, RN reviewed abdominal ultrasound results with patient that showed cirrhosis. Pt asking for pain medication and RN explained that staff are being cautious with this because of her lethargy and don't want to medicate her inappropriately. Pt also complaining of vaginal-anal fistula and reports that it "might need to be taken out again." Assessment WNL. RN will make MD aware of pt concerns per pt request.

## 2015-05-16 NOTE — Progress Notes (Signed)
   05/16/15 1500  Clinical Encounter Type  Visited With Patient and family together  Visit Type Initial  Referral From Nurse  Consult/Referral To Chaplain  Spiritual Encounters  Spiritual Needs Prayer  Stress Factors  Patient Stress Factors Family relationships;Health changes  Family Stress Factors Family relationships;Major life changes  Met w/patient to provide pastoral care & prayer.   Chap. Leontyne [place] G. [place]

## 2015-05-17 DIAGNOSIS — E1122 Type 2 diabetes mellitus with diabetic chronic kidney disease: Secondary | ICD-10-CM | POA: Diagnosis not present

## 2015-05-17 DIAGNOSIS — F1010 Alcohol abuse, uncomplicated: Secondary | ICD-10-CM | POA: Diagnosis not present

## 2015-05-17 DIAGNOSIS — R32 Unspecified urinary incontinence: Secondary | ICD-10-CM | POA: Diagnosis not present

## 2015-05-17 DIAGNOSIS — K7460 Unspecified cirrhosis of liver: Secondary | ICD-10-CM | POA: Diagnosis not present

## 2015-05-17 DIAGNOSIS — I951 Orthostatic hypotension: Secondary | ICD-10-CM | POA: Diagnosis not present

## 2015-05-17 DIAGNOSIS — J449 Chronic obstructive pulmonary disease, unspecified: Secondary | ICD-10-CM | POA: Diagnosis not present

## 2015-05-17 DIAGNOSIS — I129 Hypertensive chronic kidney disease with stage 1 through stage 4 chronic kidney disease, or unspecified chronic kidney disease: Secondary | ICD-10-CM | POA: Diagnosis not present

## 2015-05-17 DIAGNOSIS — N189 Chronic kidney disease, unspecified: Secondary | ICD-10-CM | POA: Diagnosis not present

## 2015-05-17 DIAGNOSIS — Z7984 Long term (current) use of oral hypoglycemic drugs: Secondary | ICD-10-CM | POA: Diagnosis not present

## 2015-05-17 DIAGNOSIS — G9340 Encephalopathy, unspecified: Secondary | ICD-10-CM | POA: Diagnosis not present

## 2015-05-17 DIAGNOSIS — F419 Anxiety disorder, unspecified: Secondary | ICD-10-CM | POA: Diagnosis not present

## 2015-05-17 DIAGNOSIS — F329 Major depressive disorder, single episode, unspecified: Secondary | ICD-10-CM | POA: Diagnosis not present

## 2015-05-18 DIAGNOSIS — E119 Type 2 diabetes mellitus without complications: Secondary | ICD-10-CM | POA: Diagnosis not present

## 2015-05-18 DIAGNOSIS — R509 Fever, unspecified: Secondary | ICD-10-CM | POA: Diagnosis not present

## 2015-05-18 DIAGNOSIS — Z885 Allergy status to narcotic agent status: Secondary | ICD-10-CM | POA: Diagnosis not present

## 2015-05-18 DIAGNOSIS — R197 Diarrhea, unspecified: Secondary | ICD-10-CM | POA: Diagnosis not present

## 2015-05-18 DIAGNOSIS — Z888 Allergy status to other drugs, medicaments and biological substances status: Secondary | ICD-10-CM | POA: Diagnosis not present

## 2015-05-18 DIAGNOSIS — K7030 Alcoholic cirrhosis of liver without ascites: Secondary | ICD-10-CM | POA: Diagnosis not present

## 2015-05-18 DIAGNOSIS — Z881 Allergy status to other antibiotic agents status: Secondary | ICD-10-CM | POA: Diagnosis not present

## 2015-05-18 DIAGNOSIS — Z79899 Other long term (current) drug therapy: Secondary | ICD-10-CM | POA: Diagnosis not present

## 2015-05-18 DIAGNOSIS — E669 Obesity, unspecified: Secondary | ICD-10-CM | POA: Diagnosis not present

## 2015-05-18 DIAGNOSIS — N3000 Acute cystitis without hematuria: Secondary | ICD-10-CM | POA: Diagnosis not present

## 2015-05-18 DIAGNOSIS — Z880 Allergy status to penicillin: Secondary | ICD-10-CM | POA: Diagnosis not present

## 2015-05-18 DIAGNOSIS — Z9049 Acquired absence of other specified parts of digestive tract: Secondary | ICD-10-CM | POA: Diagnosis not present

## 2015-05-18 DIAGNOSIS — J439 Emphysema, unspecified: Secondary | ICD-10-CM | POA: Diagnosis not present

## 2015-05-18 DIAGNOSIS — G40909 Epilepsy, unspecified, not intractable, without status epilepticus: Secondary | ICD-10-CM | POA: Diagnosis not present

## 2015-05-18 DIAGNOSIS — Z8673 Personal history of transient ischemic attack (TIA), and cerebral infarction without residual deficits: Secondary | ICD-10-CM | POA: Diagnosis not present

## 2015-05-18 DIAGNOSIS — N189 Chronic kidney disease, unspecified: Secondary | ICD-10-CM | POA: Diagnosis not present

## 2015-05-18 DIAGNOSIS — J449 Chronic obstructive pulmonary disease, unspecified: Secondary | ICD-10-CM | POA: Diagnosis not present

## 2015-05-18 DIAGNOSIS — M810 Age-related osteoporosis without current pathological fracture: Secondary | ICD-10-CM | POA: Diagnosis not present

## 2015-05-18 DIAGNOSIS — B1920 Unspecified viral hepatitis C without hepatic coma: Secondary | ICD-10-CM | POA: Diagnosis not present

## 2015-05-18 DIAGNOSIS — F17210 Nicotine dependence, cigarettes, uncomplicated: Secondary | ICD-10-CM | POA: Diagnosis not present

## 2015-05-18 DIAGNOSIS — I129 Hypertensive chronic kidney disease with stage 1 through stage 4 chronic kidney disease, or unspecified chronic kidney disease: Secondary | ICD-10-CM | POA: Diagnosis not present

## 2015-05-18 DIAGNOSIS — F1021 Alcohol dependence, in remission: Secondary | ICD-10-CM | POA: Diagnosis not present

## 2015-05-18 DIAGNOSIS — K219 Gastro-esophageal reflux disease without esophagitis: Secondary | ICD-10-CM | POA: Diagnosis not present

## 2015-05-18 DIAGNOSIS — A09 Infectious gastroenteritis and colitis, unspecified: Secondary | ICD-10-CM | POA: Diagnosis not present

## 2015-05-18 DIAGNOSIS — R05 Cough: Secondary | ICD-10-CM | POA: Diagnosis not present

## 2015-05-18 DIAGNOSIS — Z882 Allergy status to sulfonamides status: Secondary | ICD-10-CM | POA: Diagnosis not present

## 2015-05-18 DIAGNOSIS — F329 Major depressive disorder, single episode, unspecified: Secondary | ICD-10-CM | POA: Diagnosis not present

## 2015-05-18 IMAGING — CT CT HEAD WITHOUT CONTRAST
1 of 2 series · 16 of 30 positions shown, 20 images · non-contrast
Comparison: 12/23/2012

REASON FOR EXAM: encephalopathy
COMMENTS:

PROCEDURE:     CT  - CT HEAD WITHOUT CONTRAST  - June 07, 2013  [DATE]
CLINICAL DATA: Encephalopathy
CT HEAD WITHOUT CONTRAST
TECHNIQUE: Contiguous axial images were obtained from the base of
the skull through the vertex without contrast.

[Series 2: head wo · axial · 0.47mm/px · z∈[-143,-17]mm · 16 of 32 slices shown, 20 images]
[im 2/32  brain]
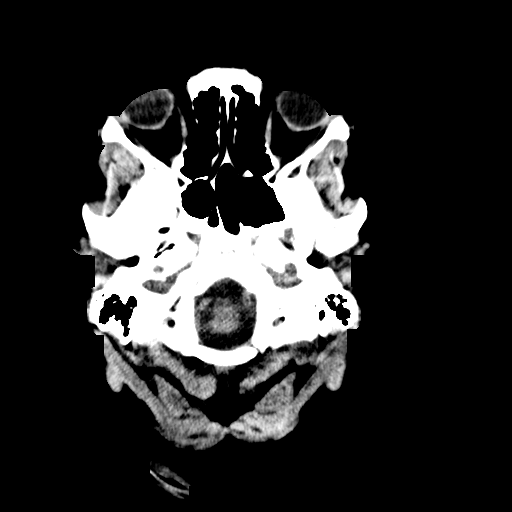
[im 2/32  bone]
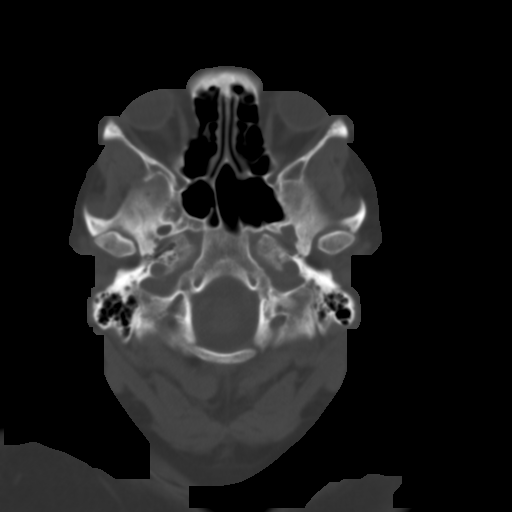
[im 4/32  brain]
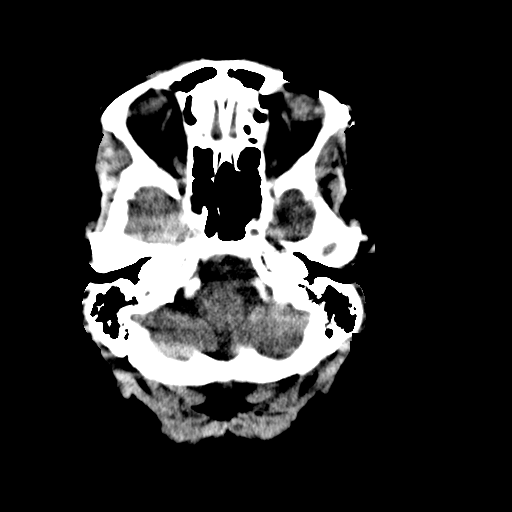
[im 6/32  brain]
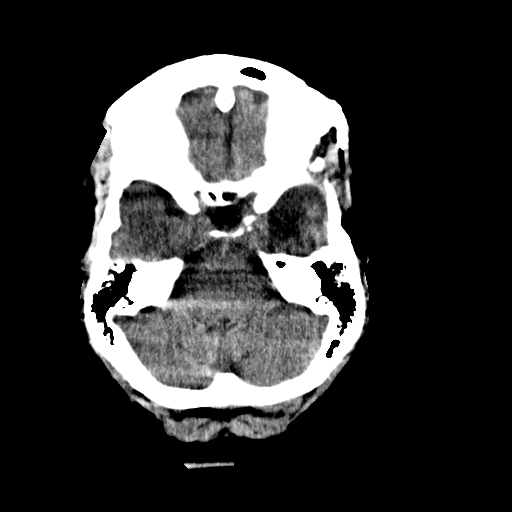
[im 8/32  brain]
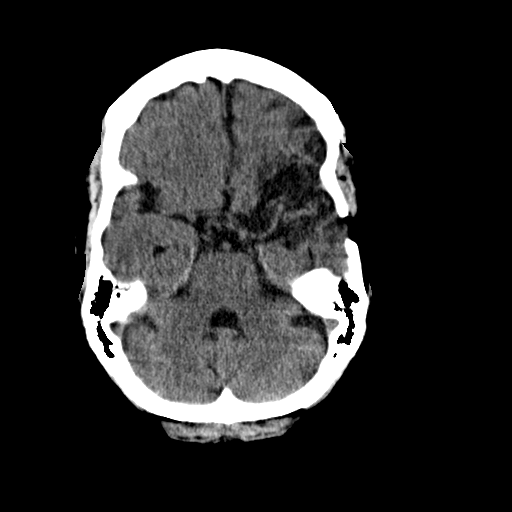
[im 10/32  brain]
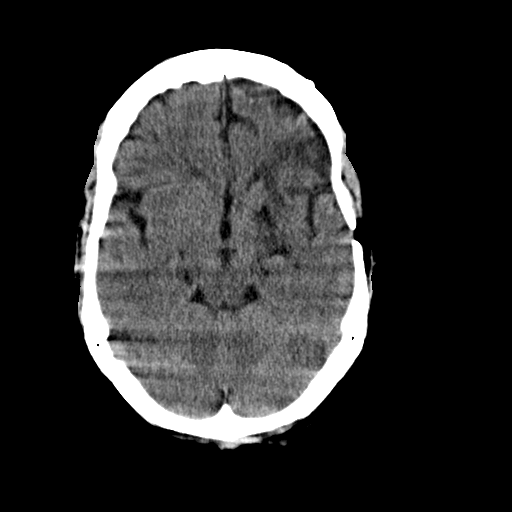
[im 10/32  bone]
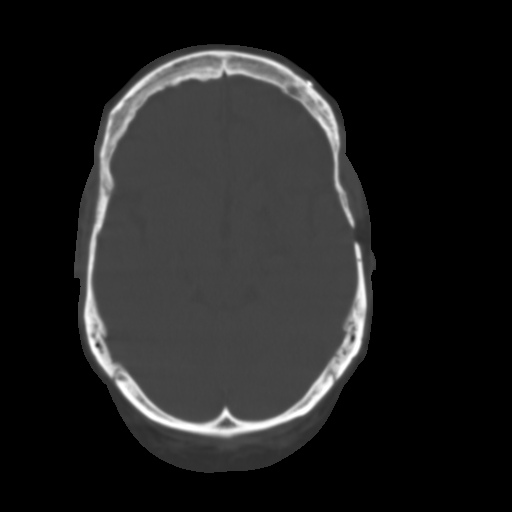
[im 11/32  brain]
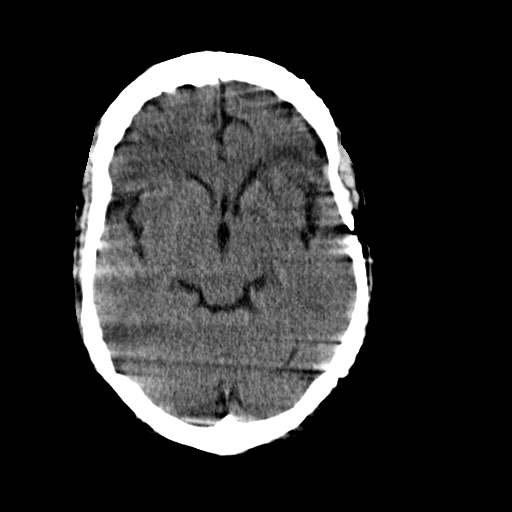
[im 13/32  brain]
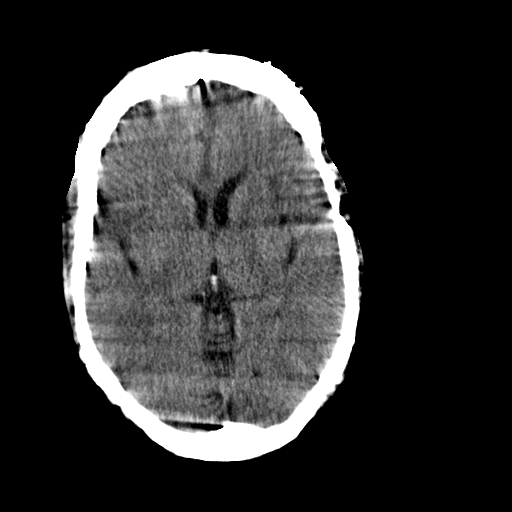
[im 15/32  brain]
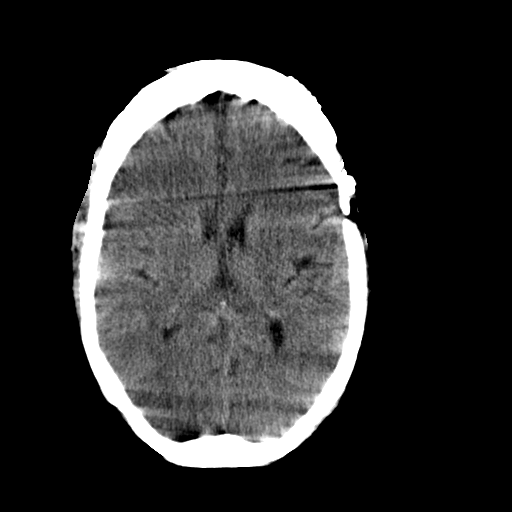
[im 17/32  brain]
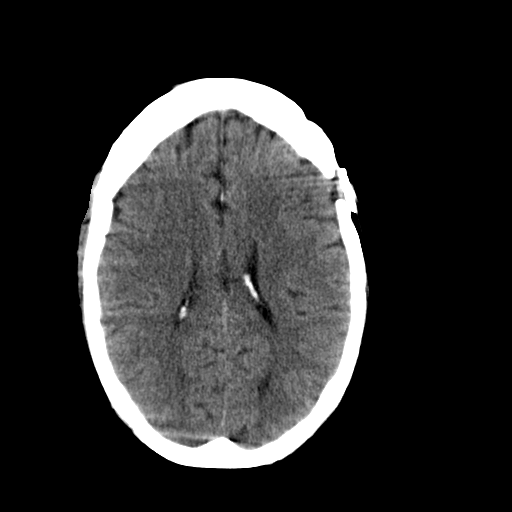
[im 17/32  bone]
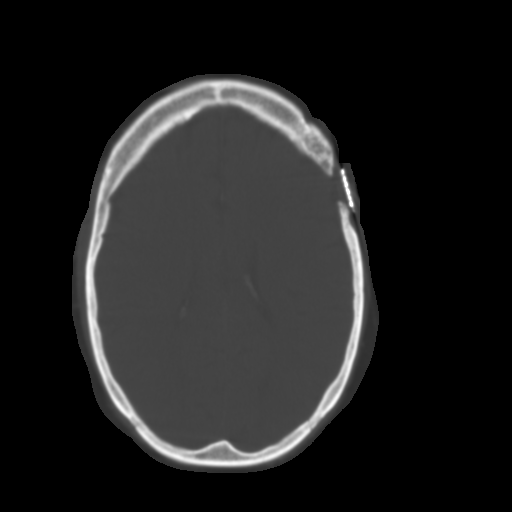
[im 19/32  brain]
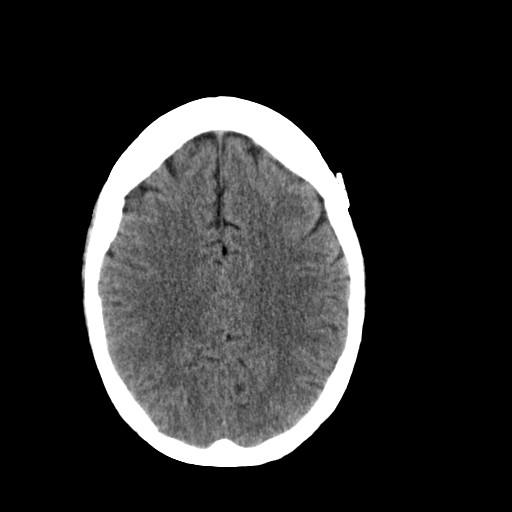
[im 21/32  brain]
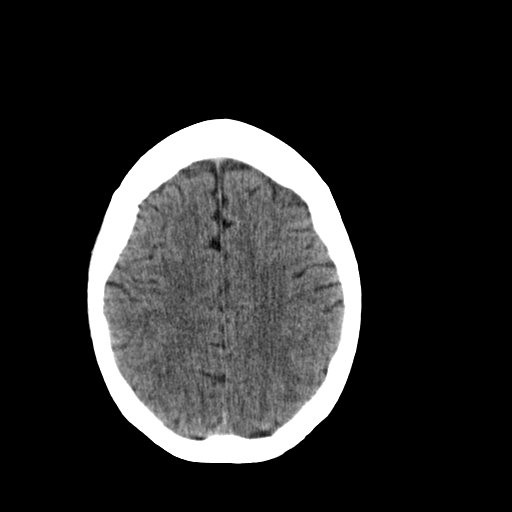
[im 22/32  brain]
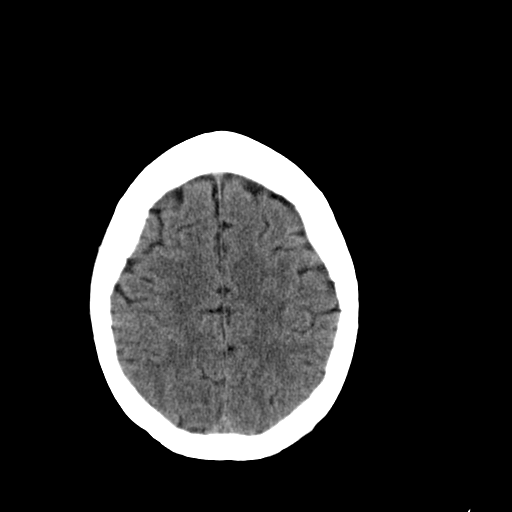
[im 24/32  brain]
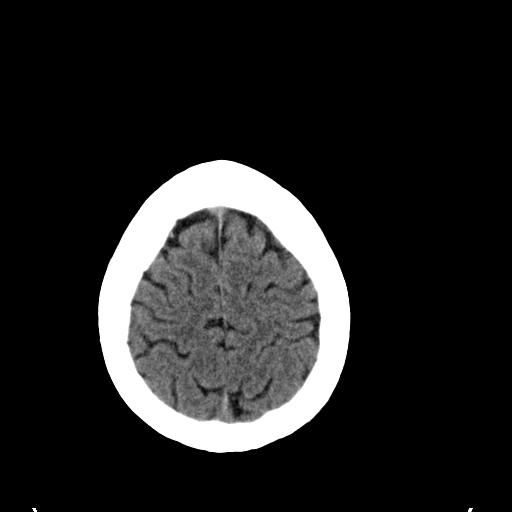
[im 24/32  bone]
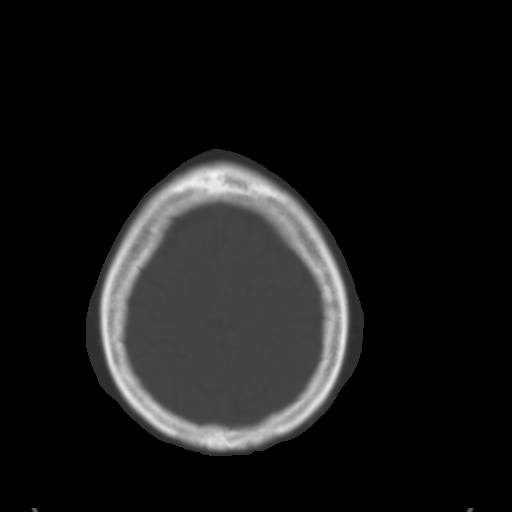
[im 26/32  brain]
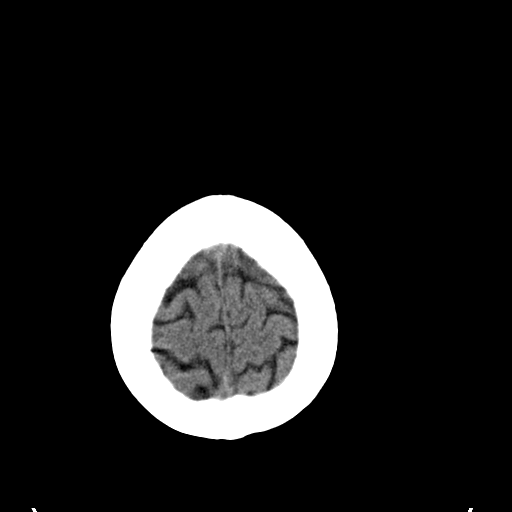
[im 28/32  brain]
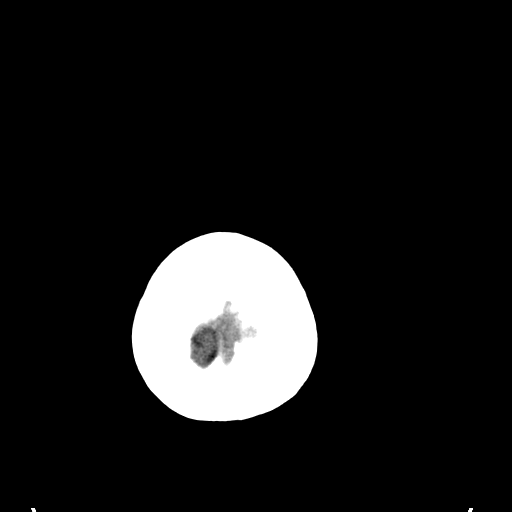
[im 30/32  brain]
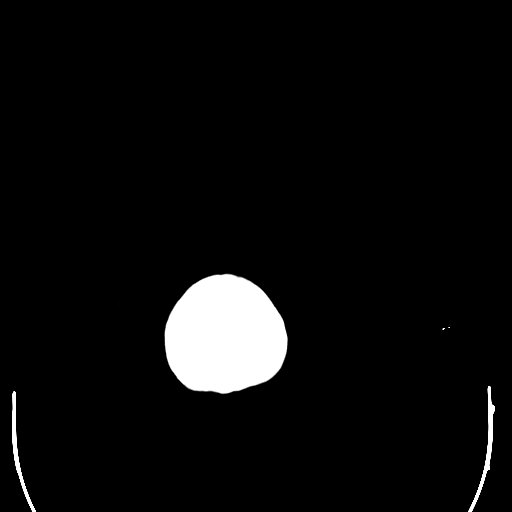

[16 of 30 positions shown; findings below may reference images not displayed]

FINDINGS: Left frontal temporal encephalomalacia and sequelae of
prior craniotomy.  No hydrocephalous.  No definite CT evidence of
acute infarction.  No intraparenchymal hemorrhage, mass, or mass
effect.  No extra-axial hematoma.  The visualized paranasal sinuses
and mastoid air cells are predominately clear.
IMPRESSION: Left frontal temporal encephalomalacia and postoperative change.
No CT evidence of acute intracranial abnormality.

## 2015-05-18 IMAGING — CT CT CHEST W/O CM
2 of 4 series · 15 of 36 positions shown, 18 images · non-contrast
Comparison: 06/06/2013 radiograph, 04/20/2013 CT

REASON FOR EXAM: cough, evaluate for pneumonia
COMMENTS:   May transport without cardiac monitor

PROCEDURE:     CT  - CT CHEST WITHOUT CONTRAST  - June 07, 2013 [DATE]
CLINICAL DATA: Cough
CT CHEST WITHOUT CONTRAST
TECHNIQUE: Multidetector CT imaging of the chest was performed
following the standard protocol without IV contrast.

[Series 2: routine chest wo · axial · 0.85mm/px · z∈[-350,-104]mm · 12 of 59 slices shown, 15 images]
[im 5/59  mediastinal]
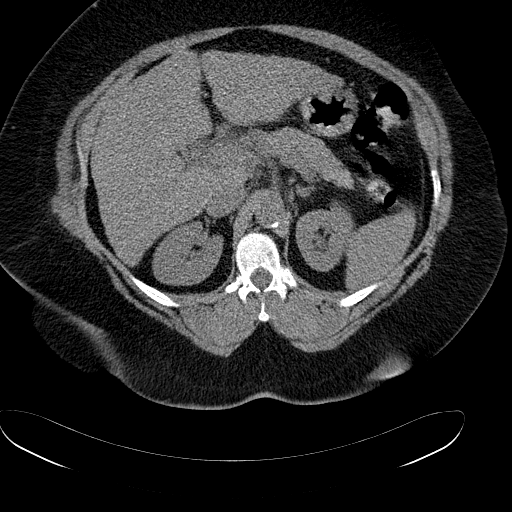
[im 5/59  lung]
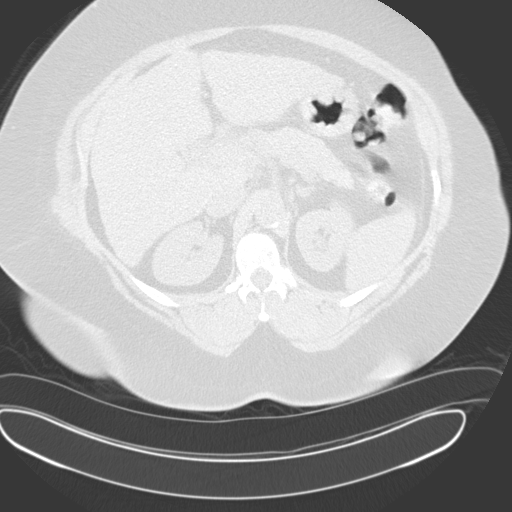
[im 9/59  lung]
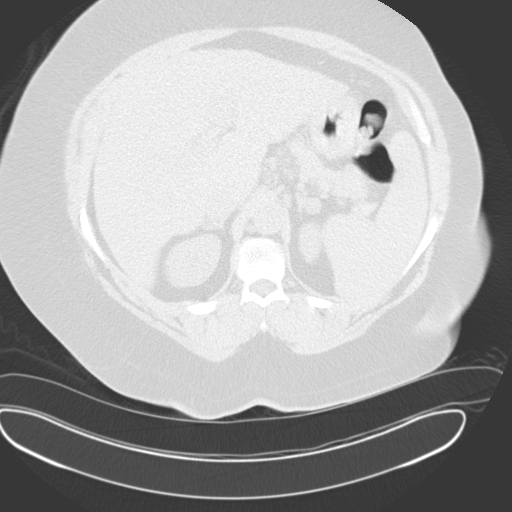
[im 14/59  lung]
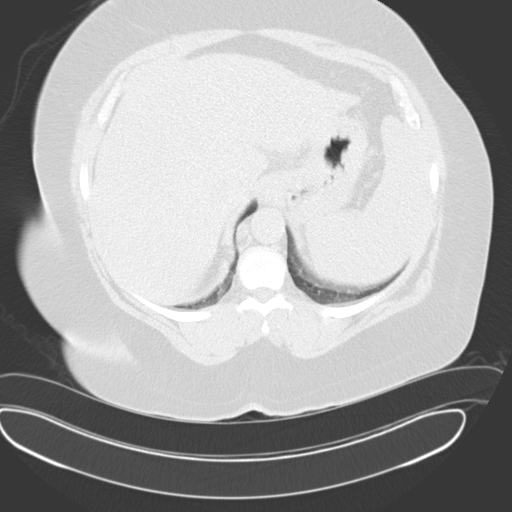
[im 18/59  lung]
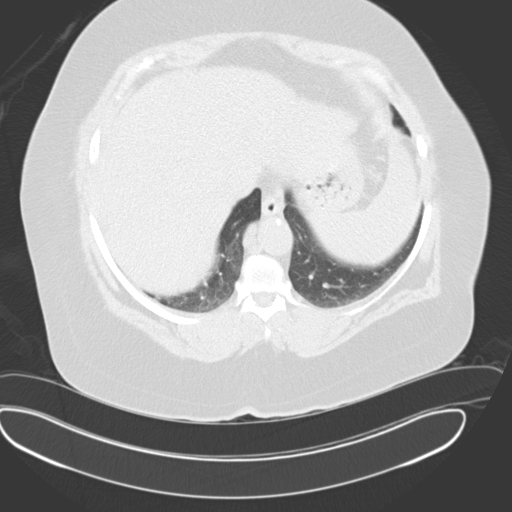
[im 23/59  mediastinal]
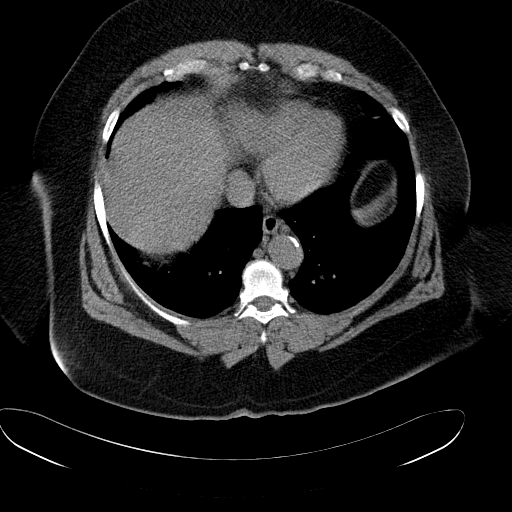
[im 23/59  lung]
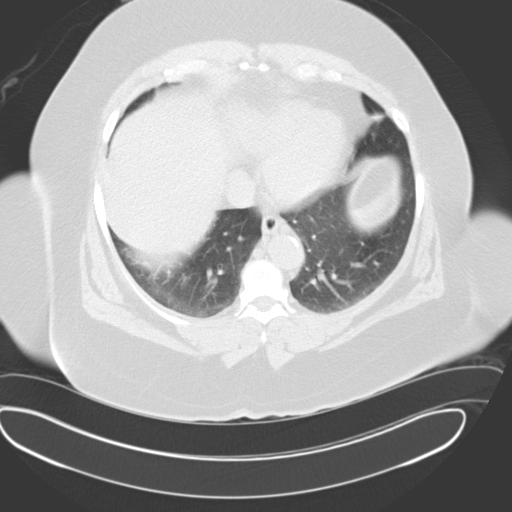
[im 27/59  lung]
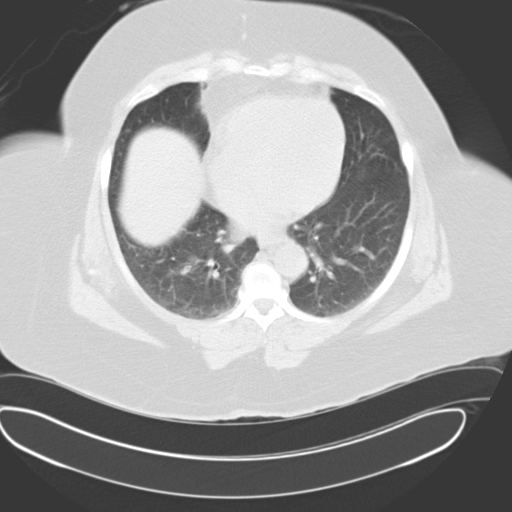
[im 32/59  lung]
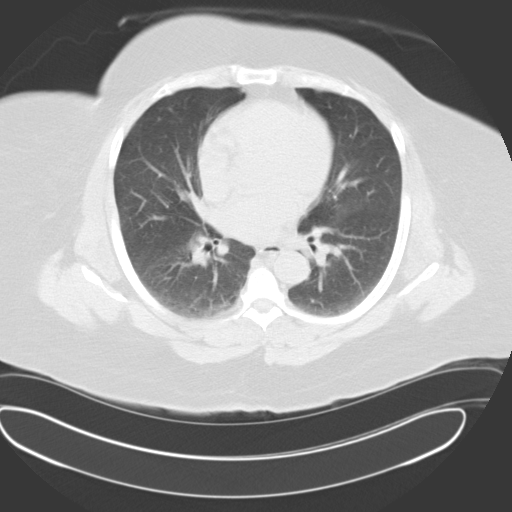
[im 36/59  lung]
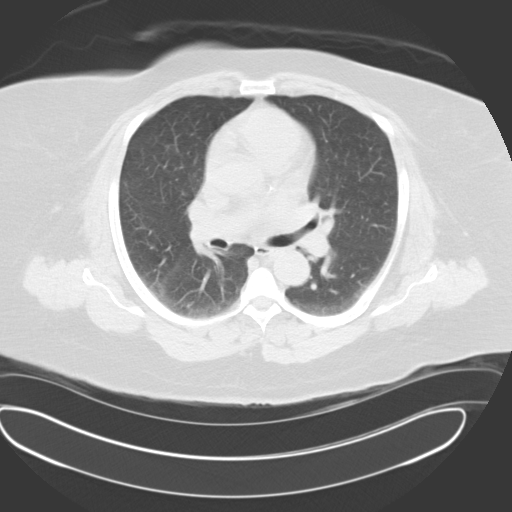
[im 41/59  mediastinal]
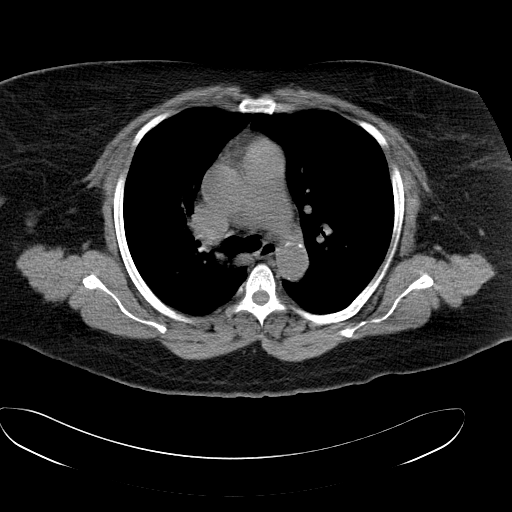
[im 41/59  lung]
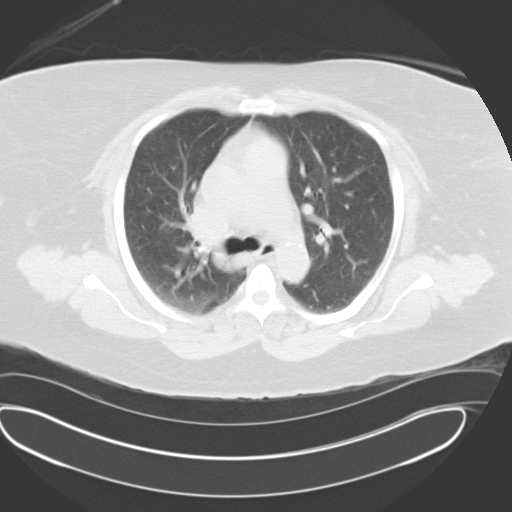
[im 45/59  lung]
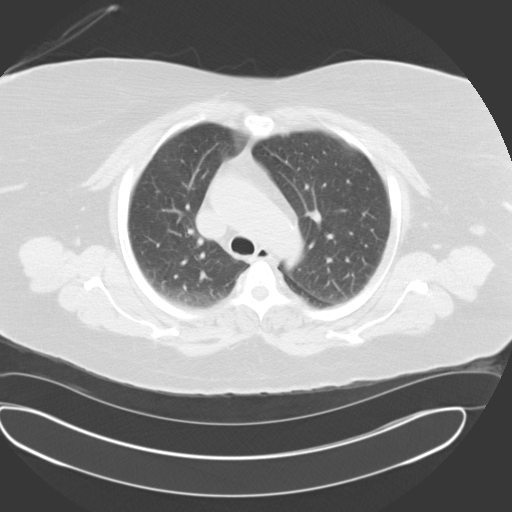
[im 50/59  lung]
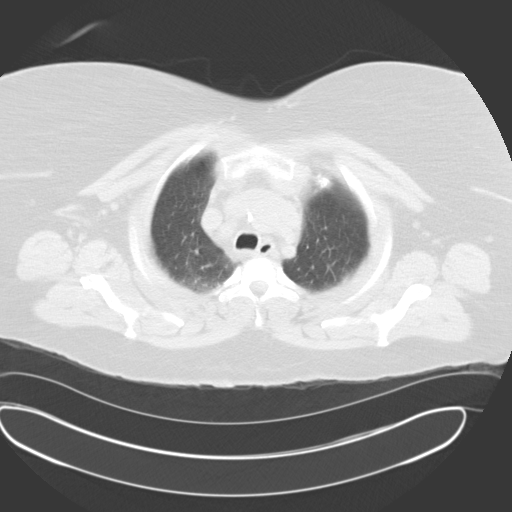
[im 54/59  lung]
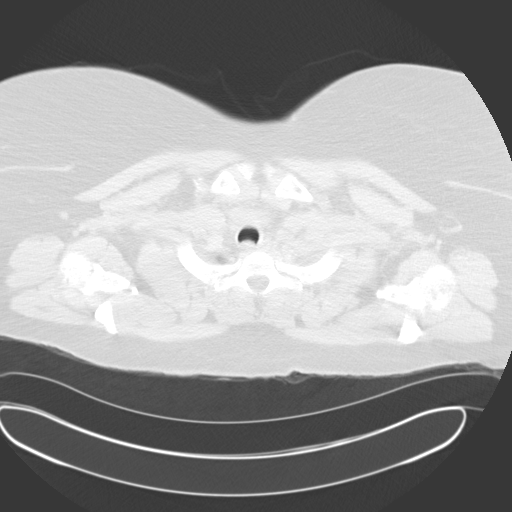

[Series 5: cor routine chest wo · coronal · 0.58mm/px · 3 of 153 slices shown]
[im 31/153  lung]
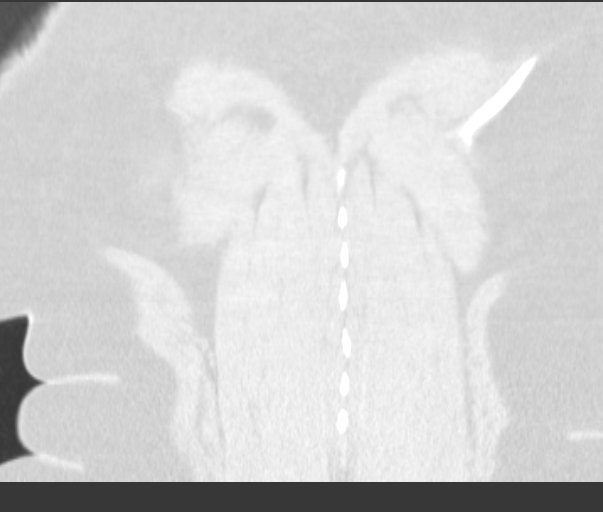
[im 61/153  lung]
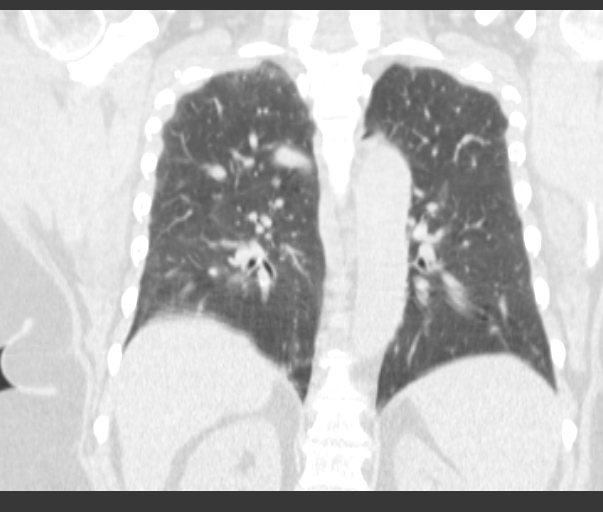
[im 92/153  lung]
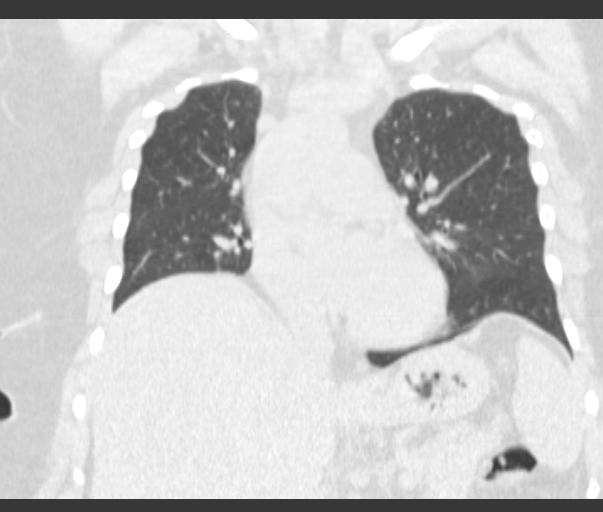

[15 of 36 positions shown; findings below may reference images not displayed]

FINDINGS: Scattered atherosclerosis of the aorta and branch vessels
without aneurysmal dilatation.  There is ectasia along the arch up
to 3.6 cm.  Normal heart size.  Coronary artery calcifications.
Trace pericardial fluid versus thickening.  No pleural effusion.
No intrathoracic lymphadenopathy.  Limited upper abdominal images
show a nodular/cirrhotic liver contour.  Cholecystectomy clips.

Central airways are patent.  No pneumothorax.  Mild dependent
atelectasis and linear opacity right lung base which may reflect
scarring.  Detailed parenchymal evaluation degraded by respiratory
motion.

No acute osseous finding.
IMPRESSION: Mild bibasilar atelectasis or scarring.  Otherwise, no acute
intrathoracic process.

Limited upper abdominal images show a nodular/cirrhotic liver
contour.

## 2015-05-19 NOTE — Discharge Summary (Signed)
[facility] at [place] NAME: [name]    MR#:  [id number]  DATE OF BIRTH:  01-30-1956  DATE OF ADMISSION:  05/14/2015 ADMITTING PHYSICIAN: Harrie Foreman, MD  DATE OF DISCHARGE: 05/16/2015  3:16 PM  PRIMARY CARE PHYSICIAN: Larey Days, MD    ADMISSION DIAGNOSIS:  Lightheadedness [R42]  DISCHARGE DIAGNOSIS:  Active Problems:   Orthostatic hypotension  SECONDARY DIAGNOSIS:   Past Medical History  Diagnosis Date  . Emphysema of lung ([place])   . Depression   . Diverticulitis   . Chronic bronchitis ([place])   . Malignant brain tumor ([place]) 2007  . GERD (gastroesophageal reflux disease)   . Allergy   . Hepatitis C   . Hypertension   . Chronic kidney disease   . Migraines   . Urine incontinence   . Seizures ([place])   . Stroke [facility]) 2007    during brain surgery  . Pancreatitis, alcoholic 0000000  . Rectovaginal fistula   . H/O ETOH abuse     Sober since 2009  . H/O drug abuse     Clean since 2009  . Pancreatic ascites   . Cirrhosis [facility])    HOSPITAL COURSE:  59 year old female with a history of emphysema, essential hypertension, chronic kidney disease stage III, malignant brain tumor and drug abuse who presented with lightheadedness and found to have orthostatic hypotension. Please see Dr Tinnie Gens dictated H & P for further details.  Her Abdominal pain was thought to be Multifactorial secondary to ascites, splenomegaly and chronic pancreatitis. Her Abd [place] didn't show any acute pathology.  She was found to have Acute metabolic encephalopathy which was thought to be due to polypharmacy or related to her underlying cirrhosis. Urine analysis is not indicative of UTI and chest x-ray shows no evidence of pneumonia.  Her Orthostatic Hypotension improved with IVF and was no longer feeling dizzi or had orthostasis.  She was feeling much better by 8th Nov and was D/C home in stable condition. She was agreeable with D/C  plans. DISCHARGE CONDITIONS:   STABLE CONSULTS OBTAINED:     DRUG ALLERGIES:   Allergies  Allergen Reactions  . Ciprofloxacin Itching  . Dilaudid [Hydromorphone Hcl] Itching  . Hydromorphone Itching  . Ibuprofen Other (See Comments)    Other reaction(s): Other (See Comments) Cannot take because of liver disease Cannot take because of liver disease   . Sulfa Antibiotics Hives  . Zofran [Ondansetron Hcl] Itching    Other reaction(s): Other (See Comments) Do not use; vomiting per pt  . Zofran [Ondansetron] Other (See Comments)    Do not use; vomiting per pt  . Amoxicillin Rash  . Chocolate Rash  . Fentanyl Rash  . Penicillins Rash  . Strawberry Extract Rash    DISCHARGE MEDICATIONS:   Discharge Medication List as of 05/16/2015  2:49 PM    CONTINUE these medications which have NOT CHANGED   Details  ADVAIR DISKUS 500-50 MCG/DOSE AEPB inhale 1 dose by mouth twice a day, Historical Med    albuterol (PROAIR HFA) 108 (90 BASE) MCG/ACT inhaler Starting 11/30/2014, Until Discontinued, Historical Med    ALPRAZolam (XANAX) 1 MG tablet Take 1 tablet (1 mg total) by mouth 2 (two) times daily., Starting 01/11/2015, Until Discontinued, Print    amitriptyline (ELAVIL) 50 MG tablet Take 1 tablet (50 mg total) by mouth at bedtime., Starting 12/01/2012, Until Discontinued, Normal    CREON 12000 UNITS CPEP Take 24,000 Units by mouth 3 (three) times  daily before meals. , Starting 08/11/2012, Until Discontinued, Historical Med    furosemide (LASIX) 20 MG tablet Take 20 mg by mouth daily., Until Discontinued, Historical Med    gabapentin (NEURONTIN) 300 MG capsule Take 300 mg by mouth 3 (three) times daily. , Until Discontinued, Historical Med    Ipratropium-Albuterol (COMBIVENT) 20-100 MCG/ACT AERS respimat Inhale into the lungs., Until Discontinued, Historical Med    lactulose (CHRONULAC) 10 GM/15ML solution Take 20 g by mouth daily as needed for mild constipation. , Starting 09/07/2012, Until  Discontinued, Historical Med    levETIRAcetam (KEPPRA) 500 MG tablet Take 1 tablet (500 mg total) by mouth 2 (two) times daily., Starting 11/03/2012, Until Discontinued, Normal    metoprolol succinate (TOPROL-XL) 25 MG 24 hr tablet Take 1 tablet (25 mg total) by mouth daily., Starting 10/21/2012, Until Discontinued, Print    omeprazole (PRILOSEC) 20 MG capsule Take 1 capsule (20 mg total) by mouth daily., Starting 07/26/2014, Until Discontinued, Normal    Oxycodone HCl 20 MG TABS Take 1 tablet by mouth every 8 (eight) hours as needed., Starting 04/09/2015, Until Discontinued, Historical Med    PARoxetine (PAXIL) 20 MG tablet Take 20 mg by mouth., Until Discontinued, Historical Med    pravastatin (PRAVACHOL) 10 MG tablet Take 10 mg by mouth daily. , Starting 12/07/2014, Until Discontinued, Historical Med    predniSONE (DELTASONE) 5 MG tablet Take 5 mg by mouth daily with breakfast. , Starting 01/16/2015, Until Discontinued, Historical Med    promethazine (PHENERGAN) 25 MG tablet Take 25 mg by mouth., Until Discontinued, Historical Med    propranolol (INDERAL) 10 MG tablet Take 10 mg by mouth 2 (two) times daily., Starting 05/05/2015, Until Discontinued, Historical Med    sertraline (ZOLOFT) 50 MG tablet Take 50 mg by mouth daily., Until Discontinued, Historical Med    tiotropium (SPIRIVA) 18 MCG inhalation capsule Place 18 mcg into inhaler and inhale 2 (two) times daily as needed (shortness of breath). , Until Discontinued, Historical Med    traMADol (ULTRAM) 50 MG tablet Take 50 mg by mouth daily. , Starting 11/01/2014, Until Discontinued, Historical Med    triamcinolone (NASACORT ALLERGY 24HR) 55 MCG/ACT AERO nasal inhaler Place 2 sprays into the nose daily as needed (allergies)., Until Discontinued, Historical Med    zolpidem (AMBIEN) 10 MG tablet Take 1 tablet (10 mg total) by mouth at bedtime., Starting 10/21/2012, Until Discontinued, Print      STOP taking these medications      doxycycline (VIBRAMYCIN) 100 MG capsule      amLODipine (NORVASC) 10 MG tablet      cephALEXin (KEFLEX) 500 MG capsule        DISCHARGE INSTRUCTIONS:   DIET:  Regular diet  DISCHARGE CONDITION:  Good  ACTIVITY:  Activity as tolerated  OXYGEN:  Home Oxygen: No.   Oxygen Delivery: room air  DISCHARGE LOCATION:  home   If you experience worsening of your admission symptoms, develop shortness of breath, life threatening emergency, suicidal or homicidal thoughts you must seek medical attention immediately by calling 911 or calling your MD immediately  if symptoms less severe.  You Must read complete instructions/literature along with all the possible adverse reactions/side effects for all the Medicines you take and that have been prescribed to you. Take any new Medicines after you have completely understood and accpet all the possible adverse reactions/side effects.   Please note  You were cared for by a hospitalist during your hospital stay. If you have any questions about your discharge  medications or the care you received while you were in the hospital after you are discharged, you can call the unit and asked to speak with the hospitalist on call if the hospitalist that took care of you is not available. Once you are discharged, your primary care physician will handle any further medical issues. Please note that NO REFILLS for any discharge medications will be authorized once you are discharged, as it is imperative that you return to your primary care physician (or establish a relationship with a primary care physician if you do not have one) for your aftercare needs so that they can reassess your need for medications and monitor your lab values.    On the day of Discharge: VITAL SIGNS:  Blood pressure 113/65, pulse 93, temperature 99 F (37.2 C), temperature source Oral, resp. rate 16, height 5\' 8"  (1.727 m), weight 118.933 kg (262 lb 3.2 oz), SpO2 98 %. PHYSICAL EXAMINATION:   GENERAL:  59 y.o.-year-old patient lying in the bed with no acute distress.  EYES: Pupils equal, round, reactive to light and accommodation. No scleral icterus. Extraocular muscles intact.  HEENT: Head atraumatic, normocephalic. Oropharynx and nasopharynx clear.  NECK:  Supple, no jugular venous distention. No thyroid enlargement, no tenderness.  LUNGS: Normal breath sounds bilaterally, no wheezing, rales,rhonchi or crepitation. No use of accessory muscles of respiration.  CARDIOVASCULAR: S1, S2 normal. No murmurs, rubs, or gallops.  ABDOMEN: Soft, non-tender, non-distended. Bowel sounds present. No organomegaly or mass.  EXTREMITIES: No pedal edema, cyanosis, or clubbing.  NEUROLOGIC: Cranial nerves II through XII are intact. Muscle strength 5/5 in all extremities. Sensation intact. Gait not checked.  PSYCHIATRIC: The patient is alert and oriented x 3.  SKIN: No obvious rash, lesion, or ulcer.  DATA REVIEW:   CBC  Recent Labs Lab 05/14/15 2122  WBC 10.1  HGB 10.0*  HCT 31.6*  PLT 179    Chemistries   Recent Labs Lab 05/14/15 2122  05/16/15 0444  NA 137  < > 140  K 4.0  < > 4.0  CL 110  < > 111  CO2 24  < > 26  GLUCOSE 140*  < > 90  BUN 9  < > 13  CREATININE 1.05*  < > 0.93  CALCIUM 8.8*  < > 8.6*  AST 67*  --   --   ALT 43  --   --   ALKPHOS 176*  --   --   BILITOT 1.1  --   --   < > = values in this interval not displayed.    Management plans discussed with the patient, family and they are in agreement.  CODE STATUS: Full Code  TOTAL TIME TAKING CARE OF THIS PATIENT: 55 minutes.    [facility], [name] M.D on 05/19/2015 at 7:34 AM  Between 7am to 6pm - Pager - [phone]  After 6pm go to [email or web address] - password [email or web address] [place] [facility]  Office  [phone]  CC: Primary care physician; Larey Days, MD

## 2015-05-20 IMAGING — CT CT ABD-PELV W/ CM
2 of 4 series · 17 of 46 positions shown, 19 images · IV contrast (isovue)
Comparison: 12/23/2012

CLINICAL DATA: Epigastric tightness. Dark colored stools and
weakness for 3 weeks.

EXAM:
CT ABDOMEN AND PELVIS WITH CONTRAST
TECHNIQUE: Multidetector CT imaging of the abdomen and pelvis was performed
using the standard protocol following bolus administration of
intravenous contrast.
CONTRAST:  125 cc of Isovue 370.

[Series 2: routine abd pel with · axial · 0.88mm/px · z∈[+116,+566]mm · 14 of 100 slices shown, 16 images]
[im 5/100  soft-tissue]
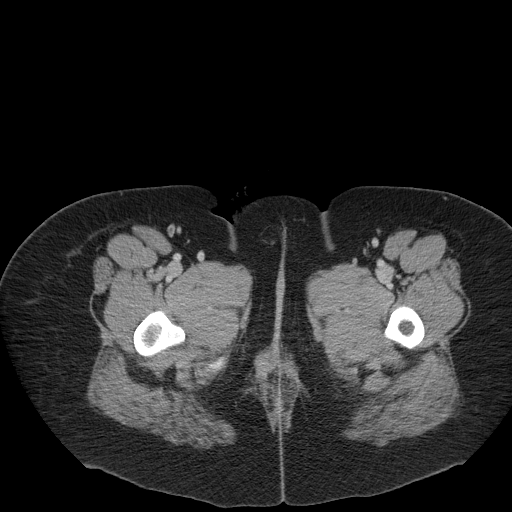
[im 5/100  bone]
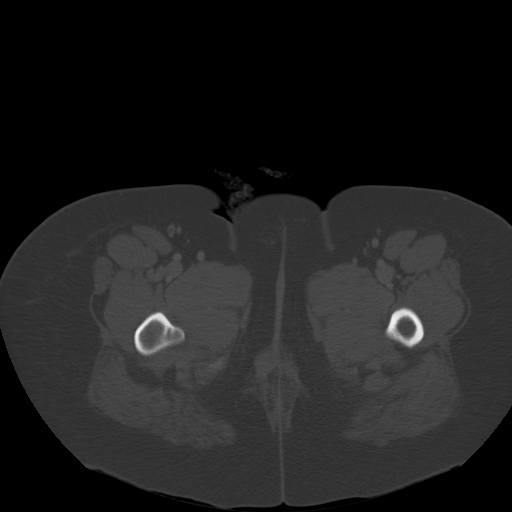
[im 14/100  soft-tissue]
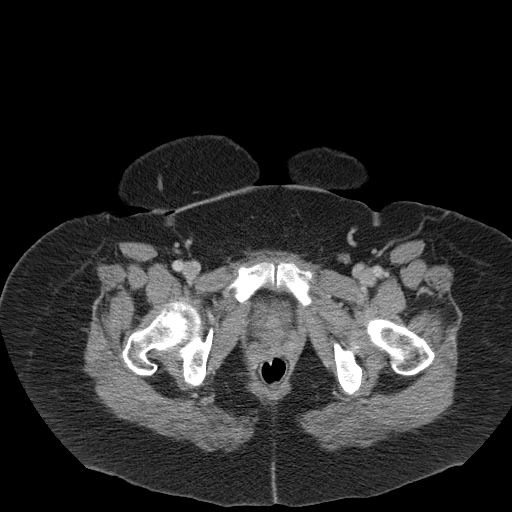
[im 19/100  soft-tissue]
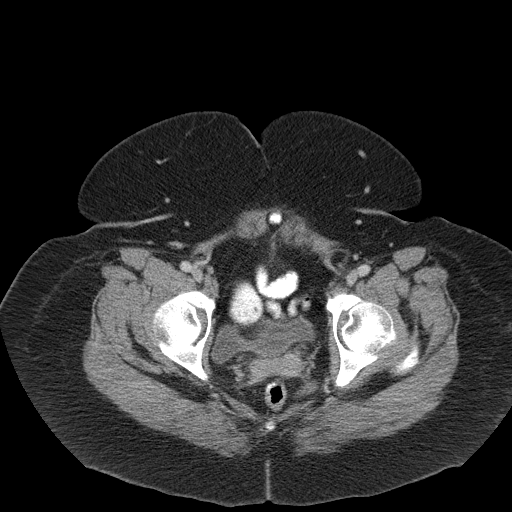
[im 28/100  soft-tissue]
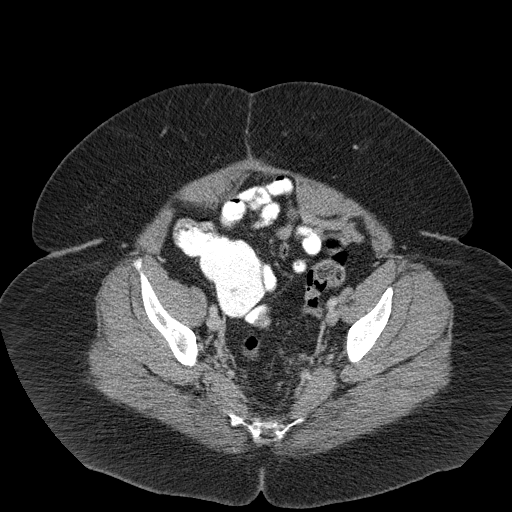
[im 32/100  soft-tissue]
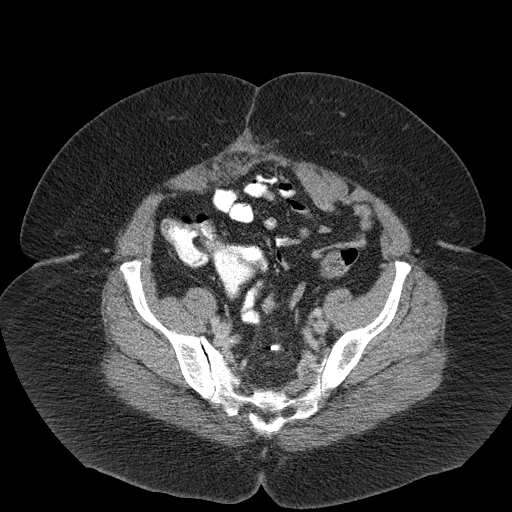
[im 41/100  soft-tissue]
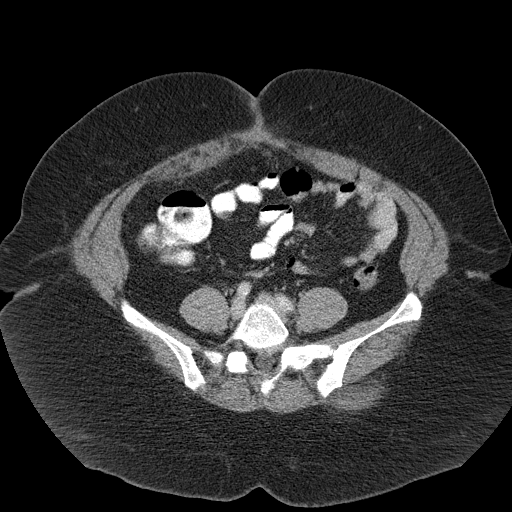
[im 46/100  soft-tissue]
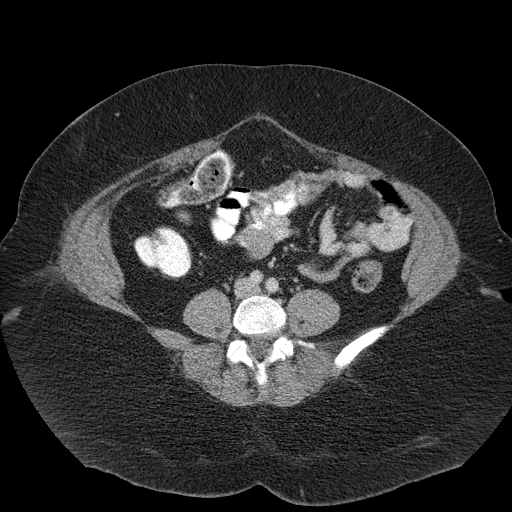
[im 55/100  soft-tissue]
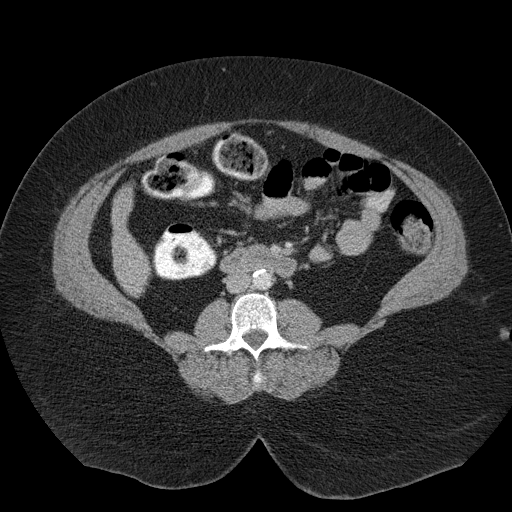
[im 59/100  soft-tissue]
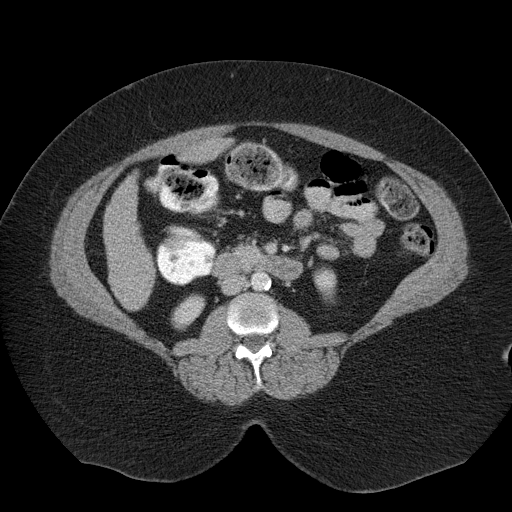
[im 59/100  bone]
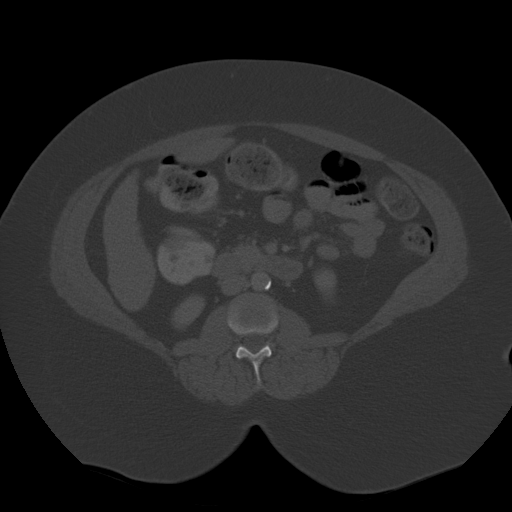
[im 68/100  soft-tissue]
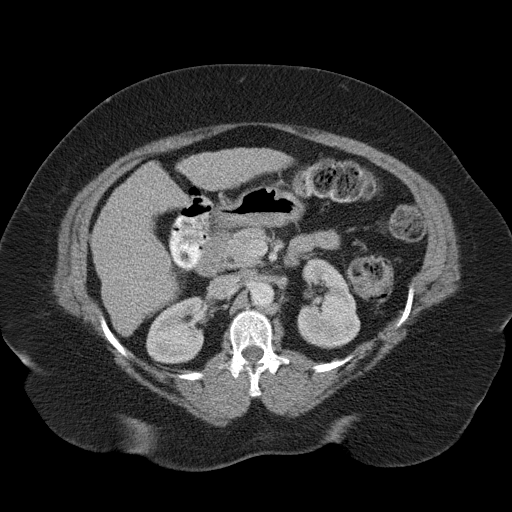
[im 73/100  soft-tissue]
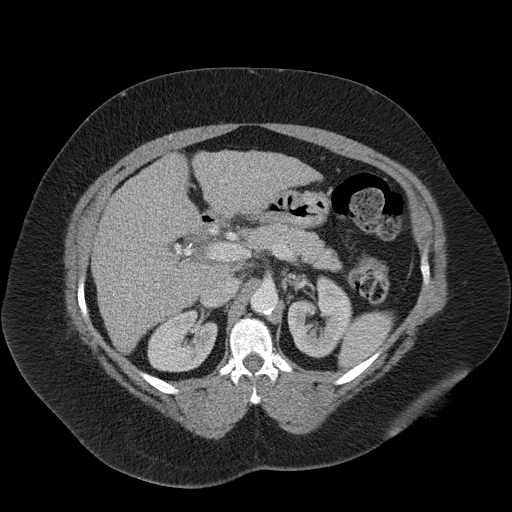
[im 82/100  soft-tissue]
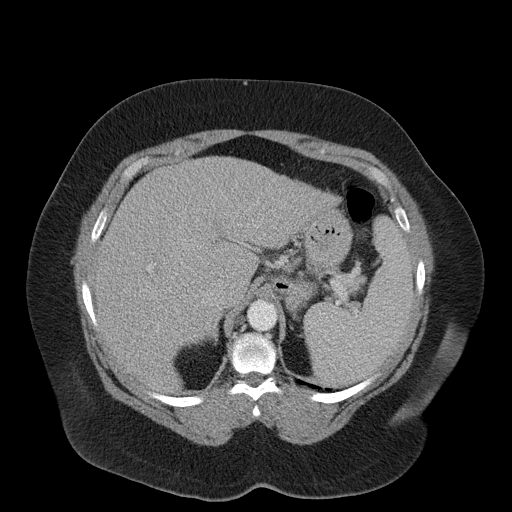
[im 86/100  soft-tissue]
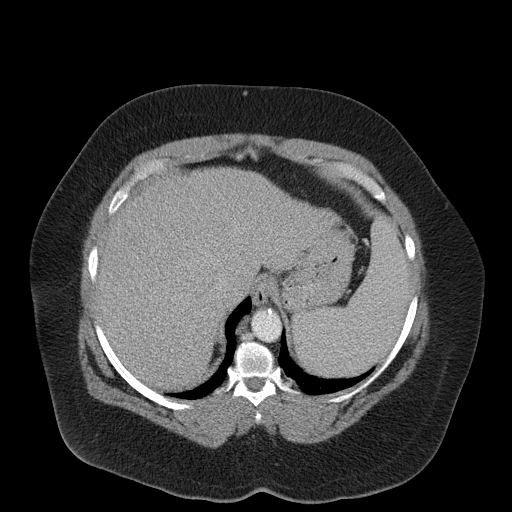
[im 95/100  soft-tissue]
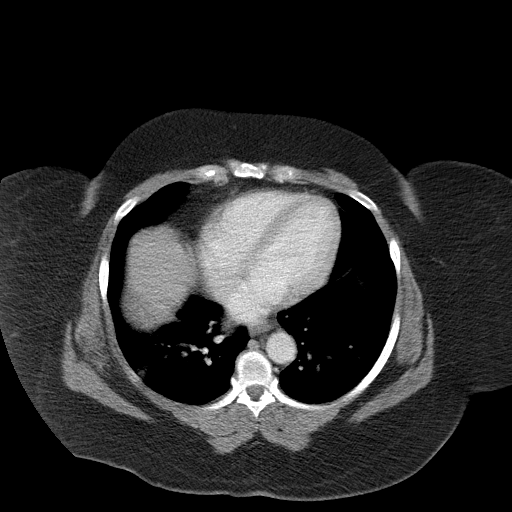

[Series 6: cor routine abd pel with · coronal · 0.97mm/px · 3 of 172 slices shown]
[im 58/172  soft-tissue]
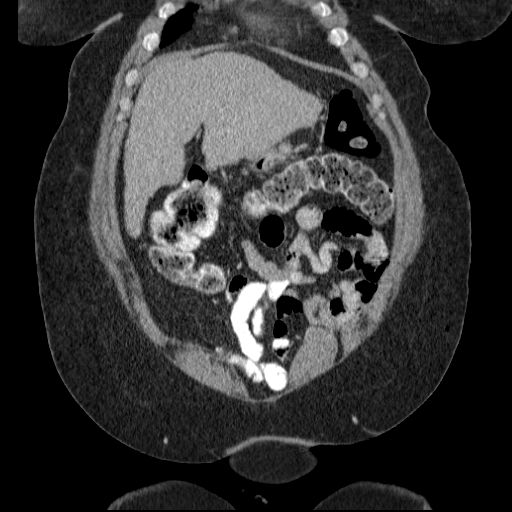
[im 77/172  soft-tissue]
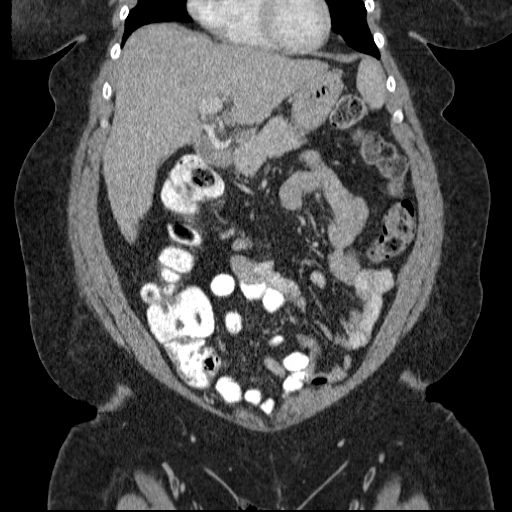
[im 96/172  soft-tissue]
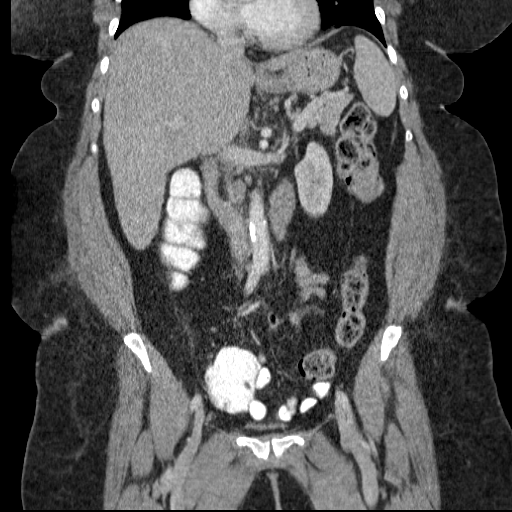

[17 of 46 positions shown; findings below may reference images not displayed]

FINDINGS: There is atelectasis versus scar noted in the right base. No pleural
or pericardial effusion.

No suspicious liver stress set no focal liver abnormality. The
contour of the liver is irregular and nodular. The patient is status
post cholecystectomy. No biliary dilatation. The pancreas appears
normal. The spleen is unremarkable. The spleen measures 11 cm in
cranial caudal dimension.

The right adrenal gland appears normal. Nodule in the left adrenal
gland measure 1.4 cm and is unchanged from previous study, image
22/series 2.

Normal appearance of both kidneys. The urinary bladder appears
normal. There is calcified atherosclerotic disease affecting the
abdominal aorta. No aneurysm. No upper abdominal adenopathy
identified. There is no pelvic or inguinal adenopathy identified. No
free fluid or abnormal fluid collections identified.

The stomach appears normal. The small bowel loops have a normal
course and caliber without obstruction. Normal appearance of the
colon.

Postoperative changes involving the distal sigmoid colon/rectum
noted. Review of the visualized bony structures is on unremarkable.
IMPRESSION: 1. No acute findings within the abdomen or pelvis.
2. Morphologic features of the liver compatible with cirrhosis.
3. Prior cholecystectomy.
4. Stable left adrenal nodule.

## 2015-05-21 ENCOUNTER — Emergency Department
Admission: EM | Admit: 2015-05-21 | Discharge: 2015-05-21 | Disposition: A | Discharge status: Home / Self Care | Payer: Medicare Other | Attending: Emergency Medicine | Admitting: Emergency Medicine

## 2015-05-21 ENCOUNTER — Other Ambulatory Visit: Payer: Self-pay

## 2015-05-21 ENCOUNTER — Emergency Department: Payer: Medicare Other

## 2015-05-21 ENCOUNTER — Encounter: Payer: Self-pay | Admitting: Emergency Medicine

## 2015-05-21 DIAGNOSIS — Z880 Allergy status to penicillin: Secondary | ICD-10-CM | POA: Diagnosis not present

## 2015-05-21 DIAGNOSIS — R110 Nausea: Secondary | ICD-10-CM | POA: Diagnosis not present

## 2015-05-21 DIAGNOSIS — R05 Cough: Secondary | ICD-10-CM | POA: Insufficient documentation

## 2015-05-21 DIAGNOSIS — F17210 Nicotine dependence, cigarettes, uncomplicated: Secondary | ICD-10-CM | POA: Diagnosis not present

## 2015-05-21 DIAGNOSIS — E119 Type 2 diabetes mellitus without complications: Secondary | ICD-10-CM | POA: Diagnosis not present

## 2015-05-21 DIAGNOSIS — M549 Dorsalgia, unspecified: Secondary | ICD-10-CM | POA: Insufficient documentation

## 2015-05-21 DIAGNOSIS — R531 Weakness: Secondary | ICD-10-CM | POA: Insufficient documentation

## 2015-05-21 DIAGNOSIS — I129 Hypertensive chronic kidney disease with stage 1 through stage 4 chronic kidney disease, or unspecified chronic kidney disease: Secondary | ICD-10-CM | POA: Diagnosis not present

## 2015-05-21 DIAGNOSIS — R300 Dysuria: Secondary | ICD-10-CM | POA: Diagnosis not present

## 2015-05-21 DIAGNOSIS — R059 Cough, unspecified: Secondary | ICD-10-CM

## 2015-05-21 DIAGNOSIS — R8279 Other abnormal findings on microbiological examination of urine: Secondary | ICD-10-CM

## 2015-05-21 DIAGNOSIS — N189 Chronic kidney disease, unspecified: Secondary | ICD-10-CM | POA: Insufficient documentation

## 2015-05-21 DIAGNOSIS — R509 Fever, unspecified: Secondary | ICD-10-CM | POA: Diagnosis present

## 2015-05-21 LAB — URINALYSIS COMPLETE WITH MICROSCOPIC (ARMC ONLY)
BILIRUBIN URINE: NEGATIVE
GLUCOSE, UA: NEGATIVE mg/dL
Ketones, ur: NEGATIVE mg/dL
Nitrite: NEGATIVE
PH: 6 (ref 5.0–8.0)
Protein, ur: NEGATIVE mg/dL
Specific Gravity, Urine: 1.004 — ABNORMAL LOW (ref 1.005–1.030)

## 2015-05-21 LAB — CBC
HEMATOCRIT: 30.4 % — AB (ref 35.0–47.0)
HEMOGLOBIN: 9.2 g/dL — AB (ref 12.0–16.0)
MCH: 23.8 pg — ABNORMAL LOW (ref 26.0–34.0)
MCHC: 30.2 g/dL — AB (ref 32.0–36.0)
MCV: 78.8 fL — AB (ref 80.0–100.0)
Platelets: 130 10*3/uL — ABNORMAL LOW (ref 150–440)
RBC: 3.86 MIL/uL (ref 3.80–5.20)
RDW: 19.8 % — AB (ref 11.5–14.5)
WBC: 6.2 10*3/uL (ref 3.6–11.0)

## 2015-05-21 LAB — BASIC METABOLIC PANEL
ANION GAP: 4 — AB (ref 5–15)
CHLORIDE: 110 mmol/L (ref 101–111)
CO2: 21 mmol/L — AB (ref 22–32)
Calcium: 8.5 mg/dL — ABNORMAL LOW (ref 8.9–10.3)
Creatinine, Ser: 0.77 mg/dL (ref 0.44–1.00)
GFR calc Af Amer: >60 mL/min (ref >60)
GFR calc non Af Amer: >60 mL/min (ref >60)
GLUCOSE: 106 mg/dL — AB (ref 65–99)
POTASSIUM: 3.2 mmol/L — AB (ref 3.5–5.1)
Sodium: 135 mmol/L (ref 135–145)

## 2015-05-21 IMAGING — CR DG CHEST 2V
1 series · 2 of 2 positions shown · non-contrast
Comparison: 06/06/2013 and CT 06/07/2013

CLINICAL DATA: Congestion and cough.

EXAM:
CHEST  2 VIEW

[Series 14: w chest pa · 0.14mm/px · 2 of 2 slices shown]
[im 1/2]
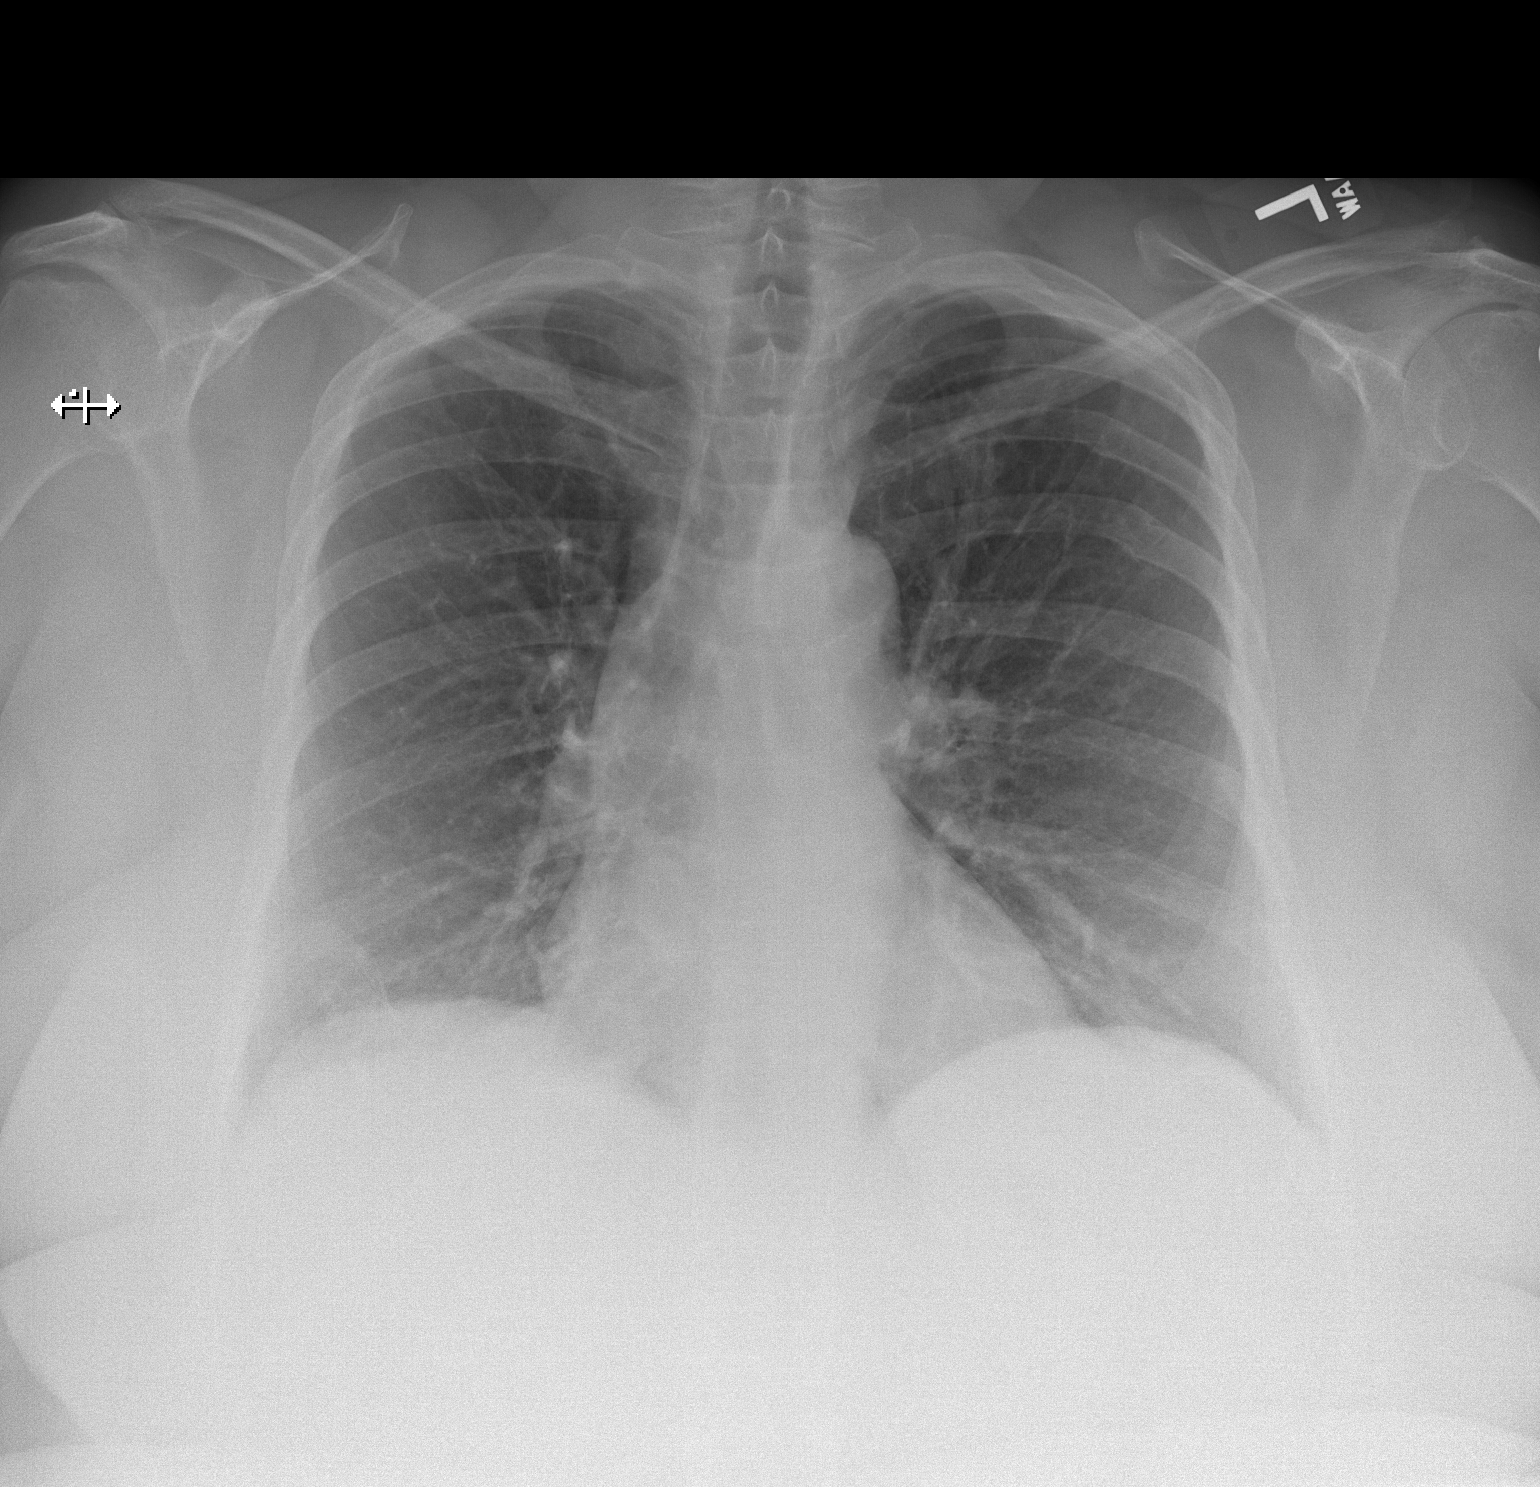
[im 2/2]
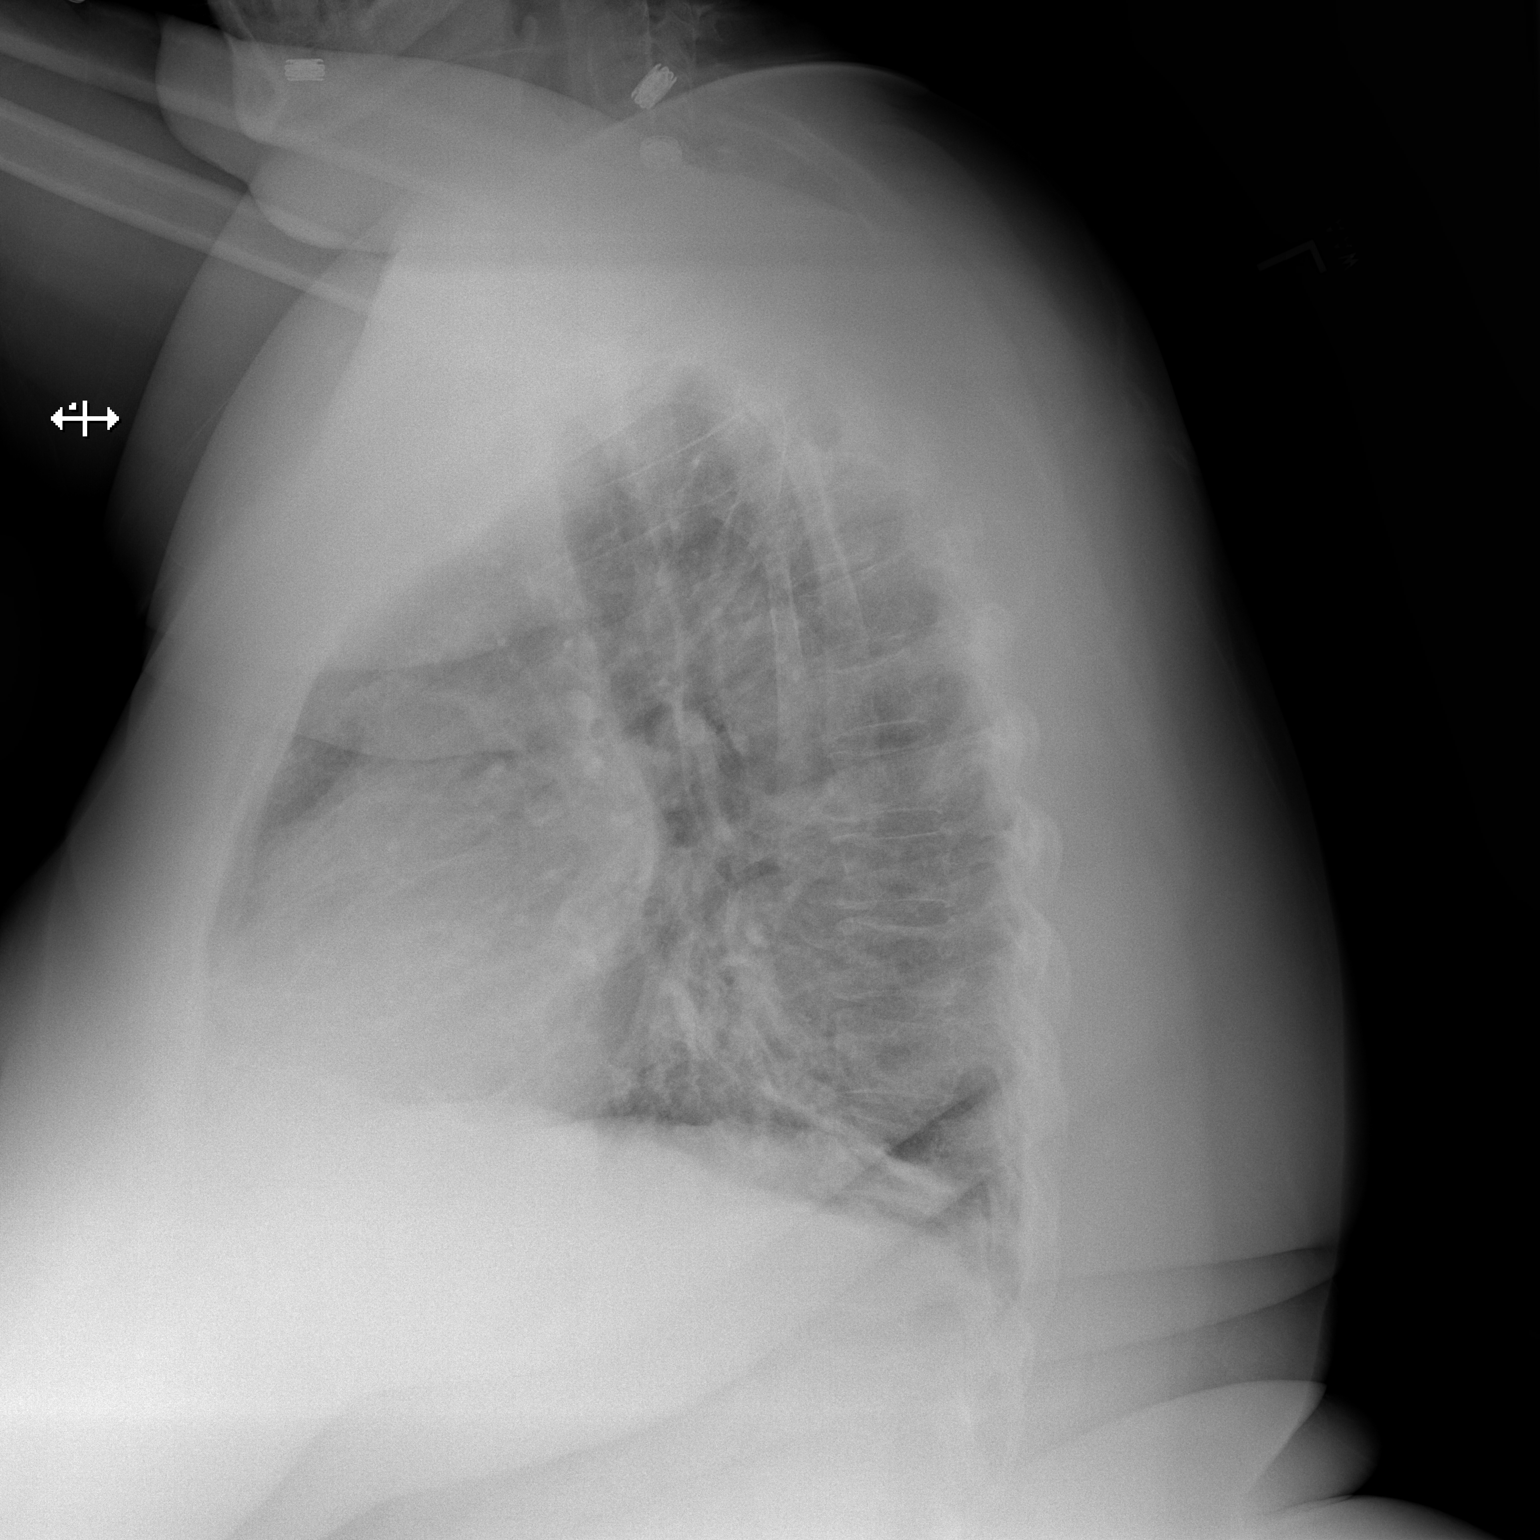

[2 of 2 positions shown; findings below may reference images not displayed]

FINDINGS: Lungs are adequately inflated with subtle linear right basilar
density likely atelectasis. No evidence of effusion. The
cardiomediastinal silhouette and remainder of the exam is unchanged.
IMPRESSION: Mild linear density right base likely atelectasis.

## 2015-05-21 MED ORDER — SODIUM CHLORIDE 0.9 % IV SOLN
Freq: Once | INTRAVENOUS | Status: AC
  Administered 2015-05-21: 17:00:00 via INTRAVENOUS

## 2015-05-21 MED ORDER — NITROFURANTOIN MONOHYD MACRO 100 MG PO CAPS: 100.0000 mg | ORAL_CAPSULE | Freq: Two times a day (BID) | ORAL | Status: DC

## 2015-05-21 MED ORDER — ONDANSETRON HCL 4 MG/2ML IJ SOLN
INTRAMUSCULAR | Status: AC
  Filled 2015-05-21: qty 2

## 2015-05-21 MED ORDER — VANCOMYCIN HCL IN DEXTROSE 1-5 GM/200ML-% IV SOLN
INTRAVENOUS | Status: AC
  Filled 2015-05-21: qty 200

## 2015-05-21 MED ORDER — VANCOMYCIN HCL IN DEXTROSE 1-5 GM/200ML-% IV SOLN
1000.0000 mg | Freq: Once | INTRAVENOUS | Status: AC
  Administered 2015-05-21: 1000 mg via INTRAVENOUS

## 2015-05-21 NOTE — ED Notes (Signed)
Pt c/o nausea, lower back pain, productive cough, and feeling like she is going to pass out, states she was in admitted into hospital Monday and Tuesday at [facility] for pneumonia and UTI, states she then went to [facility] and they called her last night and told her that her test results showed she had an infection and she could be septic, states she was in ICU last year for sepsis

## 2015-05-21 NOTE — ED Provider Notes (Signed)
[facility] Emergency Department Provider Note     Time seen: ----------------------------------------- 4:31 PM on 05/21/2015 -----------------------------------------    I have reviewed the triage vital signs and the nursing notes.   HISTORY  Chief Complaint Nausea; Fever; and Back Pain    HPI [name] is a 59 y.o. female well-known to me who presents ER after recently been diagnosed with pneumonia. Patient states she gets sick again on Thursday, called [facility] was told to come to the ER because she has urinary tract infection. Patient reports she is coughing up chunks of brown material, she does have flank pain she states with cloudy urine. Patient states she felt more dizzy with standing.   Past Medical History  Diagnosis Date  . Emphysema of lung ([place])   . Depression   . Diverticulitis   . Chronic bronchitis ([place])   . Malignant brain tumor ([place]) 2007  . GERD (gastroesophageal reflux disease)   . Allergy   . Hepatitis C   . Hypertension   . Chronic kidney disease   . Migraines   . Urine incontinence   . Seizures ([place])   . Stroke [facility]) 2007    during brain surgery  . Pancreatitis, alcoholic 0000000  . Rectovaginal fistula   . H/O ETOH abuse     Sober since 2009  . H/O drug abuse     Clean since 2009  . Pancreatic ascites   . Cirrhosis [facility])     Patient Active Problem List   Diagnosis Date Noted  . Orthostatic hypotension 05/15/2015  . ALC (alcoholic liver cirrhosis) ([place]) 12/23/2014  . Clinical depression 12/23/2014  . Anxiety 12/23/2014  . Diabetes mellitus, type 2 ([place]) 12/23/2014  . HTN (hypertension) 11/05/2014  . Liver cirrhosis ([place]) 07/19/2014  . 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 Chronic pancreatitis ([place]) 11/03/2012  . Seizure disorder ([place]) 11/03/2012  . Benign meningioma of brain ([place]) 11/03/2012  . Screening for breast cancer 11/03/2012  . Chronic pelvic pain in female 11/03/2012  . Hepatitis C virus  infection without hepatic coma 11/03/2012  . UTI (urinary tract infection) 11/03/2012  . Alcohol abuse, unspecified 11/03/2012  . Chronic liver disease 11/03/2012  . Schizophrenia ([place]) 11/03/2012  . Narcotic abuse 11/03/2012  . 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 Breast screening 11/03/2012  . 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 Nondependent alcohol abuse 11/03/2012  . 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 Female genital symptoms 11/03/2012  . Infection of urinary tract 11/03/2012    Past Surgical History  Procedure Laterality Date  . Appendectomy  1999  . Eye surgery    . Rectovaginal fistula repair w/ colostomy  2011  . Brain surgery  2007    malignant brain tumor  . Cholecystectomy  2001  . Abdominal hysterectomy  1979    Allergies Ciprofloxacin; Dilaudid; Hydromorphone; Ibuprofen; Sulfa antibiotics; Zofran; Zofran; Amoxicillin; Chocolate; Fentanyl; Penicillins; and Strawberry extract  Social History Social History  Substance Use Topics  . Smoking status: Current Some Day Smoker -- 0.25 packs/day    Types: Cigarettes  . Smokeless tobacco: Never Used     Comment: cutting back  . Alcohol Use: No     Comment: no alcohol since 2013    Review of Systems Constitutional: Positive for fever Eyes: Negative for visual changes. ENT: Negative for sore throat. Cardiovascular: Negative for chest pain. Respiratory: Negative for shortness of breath. Positive for cough Gastrointestinal: Nausea for flank pain Genitourinary:  Positive for dysuria Musculoskeletal: Positive for back pain Skin: Negative for rash. Neurological: Negative for headaches, positive for weakness  10-point ROS otherwise negative.  ____________________________________________   PHYSICAL EXAM:  VITAL SIGNS: ED Triage Vitals  Enc Vitals Group     BP 05/21/15 1506 136/101 mmHg     Pulse Rate 05/21/15 1506 98     Resp  05/21/15 1506 18     Temp 05/21/15 1506 99.4 F (37.4 C)     Temp Source 05/21/15 1506 Oral     SpO2 05/21/15 1506 98 %     Weight 05/21/15 1506 260 lb (117.935 kg)     Height 05/21/15 1506 5\' 8"  (1.727 m)     Head Cir --      Peak Flow --      Pain Score 05/21/15 1507 7     Pain Loc --      Pain Edu? --      Excl. in [place]? --     Constitutional: Alert and oriented. Well appearing and in no distress. Eyes: Conjunctivae are normal. PERRL. Normal extraocular movements. ENT   Head: Normocephalic and atraumatic.   Nose: No congestion/rhinnorhea.   Mouth/Throat: Mucous membranes are moist.   Neck: No stridor. Cardiovascular: Normal rate, regular rhythm. Normal and symmetric distal pulses are present in all extremities. No murmurs, rubs, or gallops. Respiratory: Normal respiratory effort without tachypnea nor retractions. Breath sounds are clear and equal bilaterally. No wheezes/rales/rhonchi. Gastrointestinal: Nonfocal tenderness, no rebound or guarding. Normal bowel sounds. Musculoskeletal: Nontender with normal range of motion in all extremities. No joint effusions.  No lower extremity tenderness nor edema. Neurologic:  Normal speech and language. No gross focal neurologic deficits are appreciated. Speech is normal. No gait instability. Skin:  Skin is warm, dry and intact. No rash noted. Psychiatric: Mood and affect are normal. Speech and behavior are normal. Patient exhibits appropriate insight and judgment. ____________________________________________  EKG: Interpreted by me. Normal sinus rhythm rate 85 bpm, long QT interval, normal PR interval, normal QS with, normal axis.  ____________________________________________  ED COURSE:  Pertinent labs & imaging results that were available during my care of the patient were reviewed by me and considered in my medical decision making (see chart for details). We'll recheck labs, urine and chest  x-ray. ____________________________________________    LABS (pertinent positives/negatives)  Labs Reviewed  BASIC METABOLIC PANEL - Abnormal; Notable for the following:    Potassium 3.2 (*)    CO2 21 (*)    Glucose, Bld 106 (*)    BUN <5 (*)    Calcium 8.5 (*)    Anion gap 4 (*)    All other components within normal limits  CBC - Abnormal; Notable for the following:    Hemoglobin 9.2 (*)    HCT 30.4 (*)    MCV 78.8 (*)    MCH 23.8 (*)    MCHC 30.2 (*)    RDW 19.8 (*)    Platelets 130 (*)    All other components within normal limits  URINALYSIS COMPLETEWITH MICROSCOPIC ([facility] ONLY) - Abnormal; Notable for the following:    Color, Urine YELLOW (*)    APPearance HAZY (*)    Specific Gravity, Urine 1.004 (*)    Hgb urine dipstick 1+ (*)    Leukocytes, UA TRACE (*)    Bacteria, UA RARE (*)    Squamous Epithelial / LPF 0-5 (*)    All other components within normal limits  URINE CULTURE  CBG MONITORING, ED  IMPRESSION: No acute cardiopulmonary disease.  Stable minimal opacification lateral right base likely atelectasis. ____________________________________________  FINAL ASSESSMENT AND PLAN  Cough, positive urine culture  Plan: Patient with labs and imaging as dictated above. Patient's urine appears to have cleared up here, I'm unclear what she is actually coughing up. I presumptively ordered vancomycin to treat the Enterococcus faecalis that grew out of her urine. However her urine today looks normal. She'll be discharged with Macrobid which the enterococcus was sensitive to. She is stable for outpatient follow-up.   Earleen Newport, MD   Earleen Newport, MD 05/21/15 1800

## 2015-05-21 NOTE — ED Notes (Signed)
Pt states that she was released from hospital on Tuesday for pneumonia. Pt states that she got sick again on Thursday, called by [facility] nurse to come to ER because pt has UTI. Pt also reports that she is coughing up "chunks that are like meat", coughing. Pt positive for flank pain and cloudy urine.

## 2015-05-21 NOTE — Discharge Instructions (Signed)

## 2015-05-22 DIAGNOSIS — Z7984 Long term (current) use of oral hypoglycemic drugs: Secondary | ICD-10-CM | POA: Diagnosis not present

## 2015-05-22 DIAGNOSIS — N189 Chronic kidney disease, unspecified: Secondary | ICD-10-CM | POA: Diagnosis not present

## 2015-05-22 DIAGNOSIS — E1122 Type 2 diabetes mellitus with diabetic chronic kidney disease: Secondary | ICD-10-CM | POA: Diagnosis not present

## 2015-05-22 DIAGNOSIS — F419 Anxiety disorder, unspecified: Secondary | ICD-10-CM | POA: Diagnosis not present

## 2015-05-22 DIAGNOSIS — F1010 Alcohol abuse, uncomplicated: Secondary | ICD-10-CM | POA: Diagnosis not present

## 2015-05-22 DIAGNOSIS — G9340 Encephalopathy, unspecified: Secondary | ICD-10-CM | POA: Diagnosis not present

## 2015-05-22 DIAGNOSIS — R32 Unspecified urinary incontinence: Secondary | ICD-10-CM | POA: Diagnosis not present

## 2015-05-22 DIAGNOSIS — I129 Hypertensive chronic kidney disease with stage 1 through stage 4 chronic kidney disease, or unspecified chronic kidney disease: Secondary | ICD-10-CM | POA: Diagnosis not present

## 2015-05-22 DIAGNOSIS — I951 Orthostatic hypotension: Secondary | ICD-10-CM | POA: Diagnosis not present

## 2015-05-22 DIAGNOSIS — J449 Chronic obstructive pulmonary disease, unspecified: Secondary | ICD-10-CM | POA: Diagnosis not present

## 2015-05-22 DIAGNOSIS — K7460 Unspecified cirrhosis of liver: Secondary | ICD-10-CM | POA: Diagnosis not present

## 2015-05-22 DIAGNOSIS — F329 Major depressive disorder, single episode, unspecified: Secondary | ICD-10-CM | POA: Diagnosis not present

## 2015-05-24 DIAGNOSIS — R32 Unspecified urinary incontinence: Secondary | ICD-10-CM | POA: Diagnosis not present

## 2015-05-24 DIAGNOSIS — G9340 Encephalopathy, unspecified: Secondary | ICD-10-CM | POA: Diagnosis not present

## 2015-05-24 DIAGNOSIS — E1122 Type 2 diabetes mellitus with diabetic chronic kidney disease: Secondary | ICD-10-CM | POA: Diagnosis not present

## 2015-05-24 DIAGNOSIS — J449 Chronic obstructive pulmonary disease, unspecified: Secondary | ICD-10-CM | POA: Diagnosis not present

## 2015-05-24 DIAGNOSIS — K7460 Unspecified cirrhosis of liver: Secondary | ICD-10-CM | POA: Diagnosis not present

## 2015-05-24 DIAGNOSIS — F329 Major depressive disorder, single episode, unspecified: Secondary | ICD-10-CM | POA: Diagnosis not present

## 2015-05-24 DIAGNOSIS — I129 Hypertensive chronic kidney disease with stage 1 through stage 4 chronic kidney disease, or unspecified chronic kidney disease: Secondary | ICD-10-CM | POA: Diagnosis not present

## 2015-05-24 DIAGNOSIS — F1010 Alcohol abuse, uncomplicated: Secondary | ICD-10-CM | POA: Diagnosis not present

## 2015-05-24 DIAGNOSIS — N189 Chronic kidney disease, unspecified: Secondary | ICD-10-CM | POA: Diagnosis not present

## 2015-05-24 DIAGNOSIS — F419 Anxiety disorder, unspecified: Secondary | ICD-10-CM | POA: Diagnosis not present

## 2015-05-24 DIAGNOSIS — Z7984 Long term (current) use of oral hypoglycemic drugs: Secondary | ICD-10-CM | POA: Diagnosis not present

## 2015-05-24 DIAGNOSIS — I951 Orthostatic hypotension: Secondary | ICD-10-CM | POA: Diagnosis not present

## 2015-05-24 LAB — URINE CULTURE: Special Requests: NORMAL

## 2015-05-24 NOTE — Progress Notes (Signed)
ED culture report follow up:  Patient was recently admitted to hospital and treated for pneumonia and was discharged on 05-16-15. Patient returned to ED complaining of flank pain and foul smelling urine on 05-21-15. Patient was discharged on nitrofurantoin 100 mg PO BID for a UTI.  Urine culture resulted today with 80,000 colonies of enterococcus faecium with intermediate sensitivity to nitrofurantoin and 80,000 colonies of MRSA sensitive to clindamycin. Patient has numerous drug allergies including Bactrim, ciprofloxacin, and penicillin. After speaking to ED physician, had order to call in clindamycin to pharmacy.   Spoke with patient who reports not feeling any better on the nitrofurantoin and does not want to try additional oral antibiotics due to multiple allergies and feeling poorly. Patient prefers to be admitted to hospital for treatment.  I explained to patient that we could either call in prescriptions to local pharmacy, she could return to ED for evaluation, or I could send results to her PCP. Patient reports she will return to ED tomorrow morning and refused prescription call in to pharmacy. Discussed patient's desire to return to ED with MD.   Lenis Noon 5:52 PM 05-24-2015

## 2015-05-27 DIAGNOSIS — I129 Hypertensive chronic kidney disease with stage 1 through stage 4 chronic kidney disease, or unspecified chronic kidney disease: Secondary | ICD-10-CM | POA: Diagnosis not present

## 2015-05-27 DIAGNOSIS — F329 Major depressive disorder, single episode, unspecified: Secondary | ICD-10-CM | POA: Diagnosis not present

## 2015-05-27 DIAGNOSIS — R32 Unspecified urinary incontinence: Secondary | ICD-10-CM | POA: Diagnosis not present

## 2015-05-27 DIAGNOSIS — I951 Orthostatic hypotension: Secondary | ICD-10-CM | POA: Diagnosis not present

## 2015-05-27 DIAGNOSIS — N189 Chronic kidney disease, unspecified: Secondary | ICD-10-CM | POA: Diagnosis not present

## 2015-05-27 DIAGNOSIS — E1122 Type 2 diabetes mellitus with diabetic chronic kidney disease: Secondary | ICD-10-CM | POA: Diagnosis not present

## 2015-05-27 DIAGNOSIS — F1010 Alcohol abuse, uncomplicated: Secondary | ICD-10-CM | POA: Diagnosis not present

## 2015-05-27 DIAGNOSIS — Z7984 Long term (current) use of oral hypoglycemic drugs: Secondary | ICD-10-CM | POA: Diagnosis not present

## 2015-05-27 DIAGNOSIS — G9340 Encephalopathy, unspecified: Secondary | ICD-10-CM | POA: Diagnosis not present

## 2015-05-27 DIAGNOSIS — J449 Chronic obstructive pulmonary disease, unspecified: Secondary | ICD-10-CM | POA: Diagnosis not present

## 2015-05-27 DIAGNOSIS — K7460 Unspecified cirrhosis of liver: Secondary | ICD-10-CM | POA: Diagnosis not present

## 2015-05-27 DIAGNOSIS — F419 Anxiety disorder, unspecified: Secondary | ICD-10-CM | POA: Diagnosis not present

## 2015-05-28 DIAGNOSIS — R32 Unspecified urinary incontinence: Secondary | ICD-10-CM | POA: Diagnosis not present

## 2015-05-28 DIAGNOSIS — F1010 Alcohol abuse, uncomplicated: Secondary | ICD-10-CM | POA: Diagnosis not present

## 2015-05-28 DIAGNOSIS — N189 Chronic kidney disease, unspecified: Secondary | ICD-10-CM | POA: Diagnosis not present

## 2015-05-28 DIAGNOSIS — Z7984 Long term (current) use of oral hypoglycemic drugs: Secondary | ICD-10-CM | POA: Diagnosis not present

## 2015-05-28 DIAGNOSIS — I951 Orthostatic hypotension: Secondary | ICD-10-CM | POA: Diagnosis not present

## 2015-05-28 DIAGNOSIS — F419 Anxiety disorder, unspecified: Secondary | ICD-10-CM | POA: Diagnosis not present

## 2015-05-28 DIAGNOSIS — I129 Hypertensive chronic kidney disease with stage 1 through stage 4 chronic kidney disease, or unspecified chronic kidney disease: Secondary | ICD-10-CM | POA: Diagnosis not present

## 2015-05-28 DIAGNOSIS — F329 Major depressive disorder, single episode, unspecified: Secondary | ICD-10-CM | POA: Diagnosis not present

## 2015-05-28 DIAGNOSIS — E1122 Type 2 diabetes mellitus with diabetic chronic kidney disease: Secondary | ICD-10-CM | POA: Diagnosis not present

## 2015-05-28 DIAGNOSIS — G9340 Encephalopathy, unspecified: Secondary | ICD-10-CM | POA: Diagnosis not present

## 2015-05-28 DIAGNOSIS — K7460 Unspecified cirrhosis of liver: Secondary | ICD-10-CM | POA: Diagnosis not present

## 2015-05-28 DIAGNOSIS — J449 Chronic obstructive pulmonary disease, unspecified: Secondary | ICD-10-CM | POA: Diagnosis not present

## 2015-05-31 DIAGNOSIS — M545 Low back pain: Secondary | ICD-10-CM | POA: Diagnosis not present

## 2015-05-31 DIAGNOSIS — K860 Alcohol-induced chronic pancreatitis: Secondary | ICD-10-CM | POA: Diagnosis not present

## 2015-05-31 DIAGNOSIS — G40309 Generalized idiopathic epilepsy and epileptic syndromes, not intractable, without status epilepticus: Secondary | ICD-10-CM | POA: Diagnosis not present

## 2015-05-31 DIAGNOSIS — B1920 Unspecified viral hepatitis C without hepatic coma: Secondary | ICD-10-CM | POA: Diagnosis not present

## 2015-05-31 DIAGNOSIS — G40409 Other generalized epilepsy and epileptic syndromes, not intractable, without status epilepticus: Secondary | ICD-10-CM | POA: Diagnosis not present

## 2015-05-31 DIAGNOSIS — G894 Chronic pain syndrome: Secondary | ICD-10-CM | POA: Diagnosis not present

## 2015-05-31 DIAGNOSIS — R1084 Generalized abdominal pain: Secondary | ICD-10-CM | POA: Diagnosis not present

## 2015-05-31 DIAGNOSIS — Z79891 Long term (current) use of opiate analgesic: Secondary | ICD-10-CM | POA: Diagnosis not present

## 2015-05-31 DIAGNOSIS — K769 Liver disease, unspecified: Secondary | ICD-10-CM | POA: Diagnosis not present

## 2015-05-31 DIAGNOSIS — K861 Other chronic pancreatitis: Secondary | ICD-10-CM | POA: Diagnosis not present

## 2015-06-01 IMAGING — CT CT HEAD WITHOUT CONTRAST
2 series · 16 of 30 positions shown, 18 images · non-contrast
Comparison: 06/07/2013.

CLINICAL DATA: Fall.

EXAM:
CT HEAD WITHOUT CONTRAST
TECHNIQUE: Contiguous axial images were obtained from the base of the skull
through the vertex without intravenous contrast.

[Series 2: head wo · axial · 0.42mm/px · z∈[-9,+95]mm · 8 of 30 slices shown, 10 images]
[im 4/30  brain]
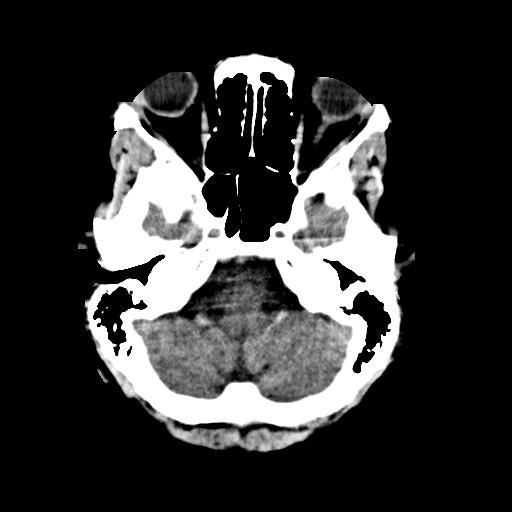
[im 4/30  bone]
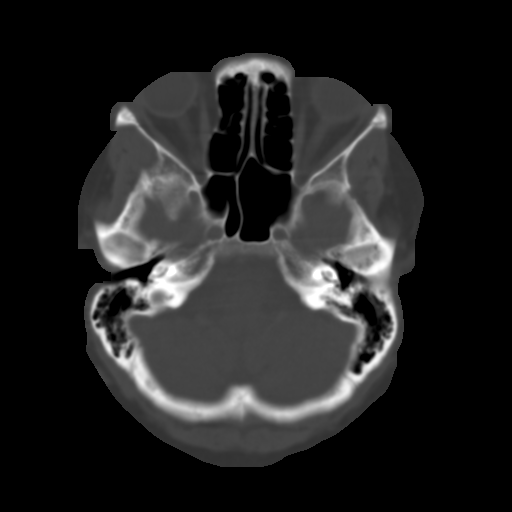
[im 7/30  brain]
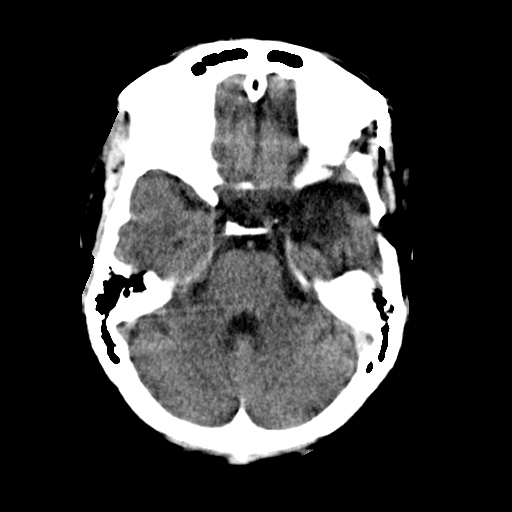
[im 10/30  brain]
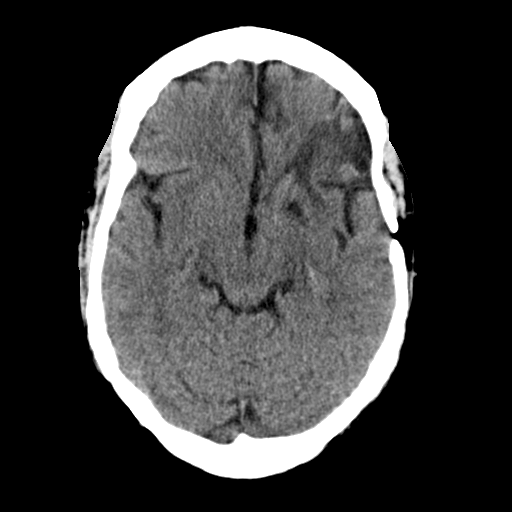
[im 13/30  brain]
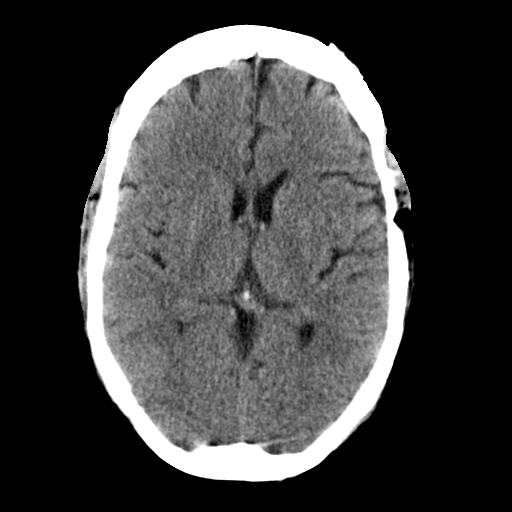
[im 17/30  brain]
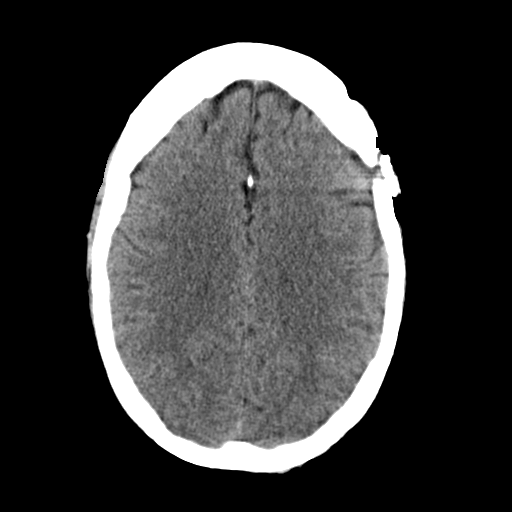
[im 17/30  bone]
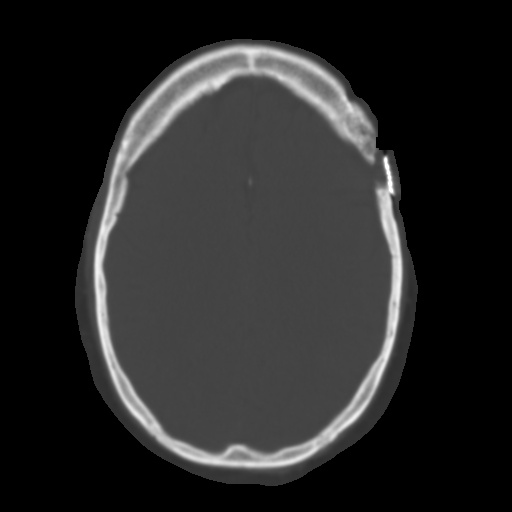
[im 20/30  brain]
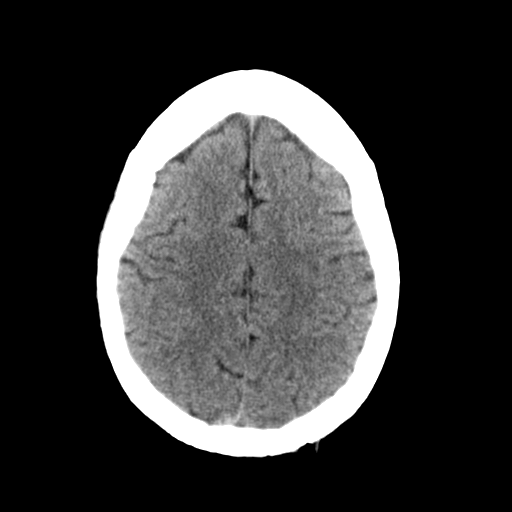
[im 23/30  brain]
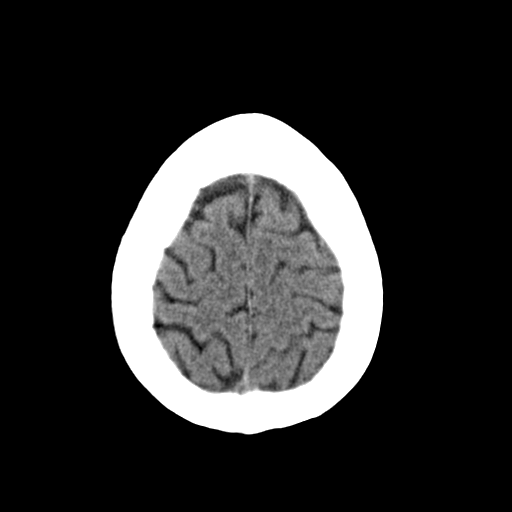
[im 26/30  brain]
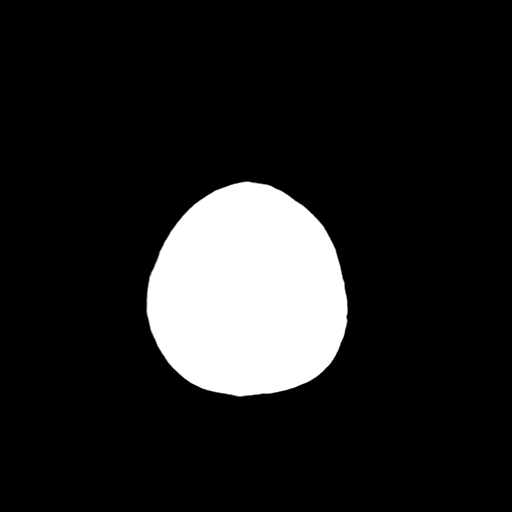

[Series 4: head bone · axial · 0.42mm/px · z∈[-10,+99]mm · 8 of 60 slices shown]
[im 7/60  bone]
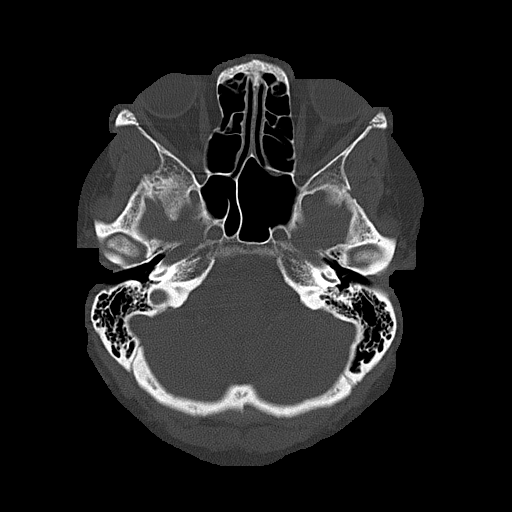
[im 13/60  bone]
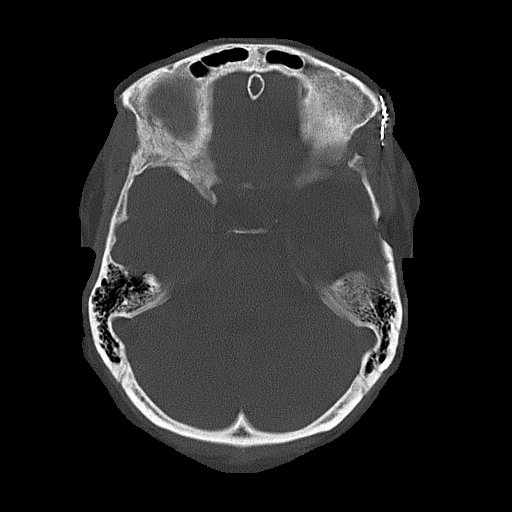
[im 19/60  bone]
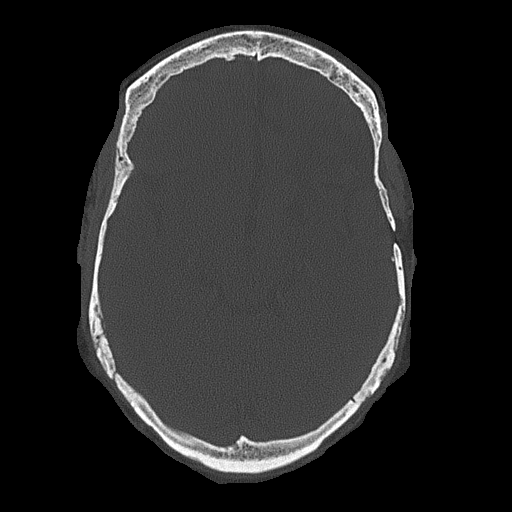
[im 25/60  bone]
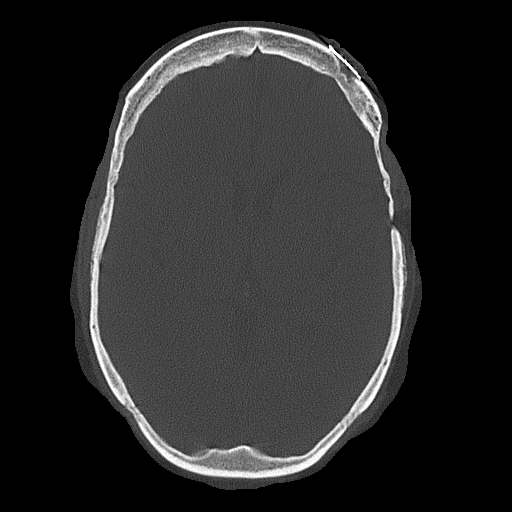
[im 35/60  bone]
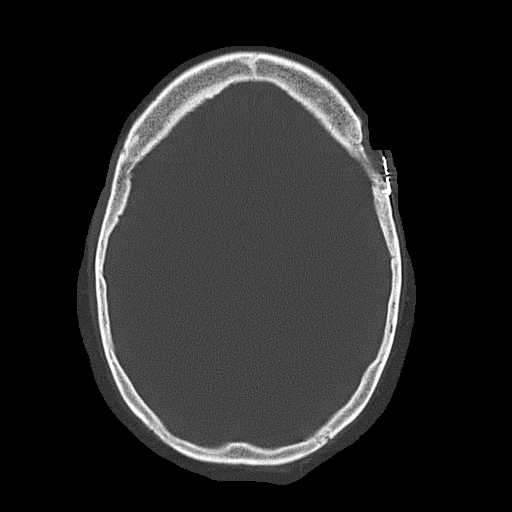
[im 41/60  bone]
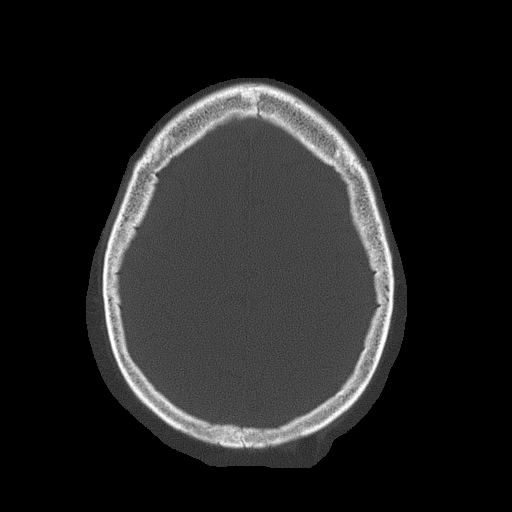
[im 47/60  bone]
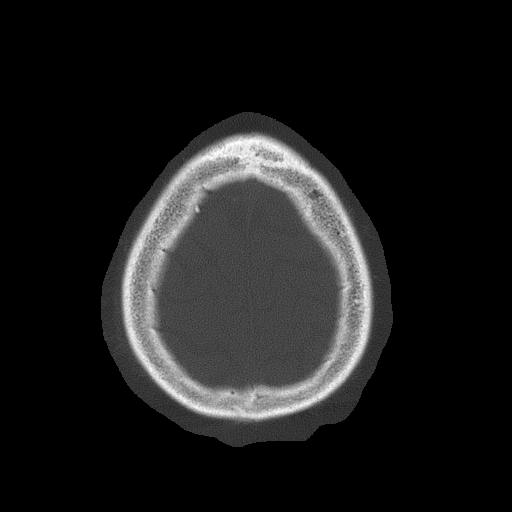
[im 53/60  bone]
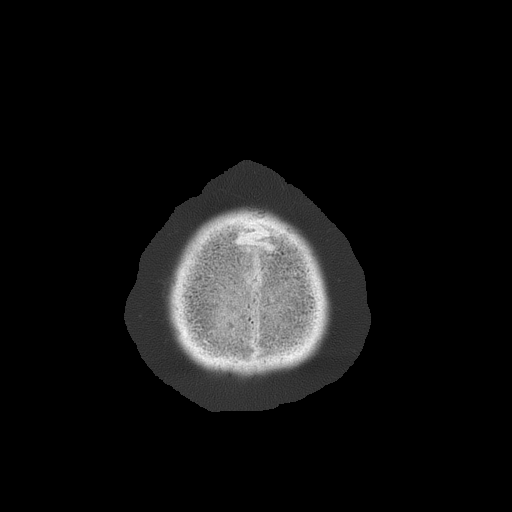

[16 of 30 positions shown; findings below may reference images not displayed]

FINDINGS: No mass. No hydrocephalus. No hemorrhage. Left frontotemporal
encephalomalacia again noted and unchanged from prior exam. Patient
has had a prior left frontoparietal craniotomy. Orbits are intact.
No acute bony abnormality identified.
IMPRESSION: Postsurgical changes left frontoparietal region with underlying
encephalomalacia. No acute abnormality.

## 2015-06-14 DIAGNOSIS — K7460 Unspecified cirrhosis of liver: Secondary | ICD-10-CM | POA: Diagnosis not present

## 2015-06-14 DIAGNOSIS — G9340 Encephalopathy, unspecified: Secondary | ICD-10-CM | POA: Diagnosis not present

## 2015-06-14 DIAGNOSIS — I951 Orthostatic hypotension: Secondary | ICD-10-CM | POA: Diagnosis not present

## 2015-06-14 DIAGNOSIS — N189 Chronic kidney disease, unspecified: Secondary | ICD-10-CM | POA: Diagnosis not present

## 2015-06-14 DIAGNOSIS — E1122 Type 2 diabetes mellitus with diabetic chronic kidney disease: Secondary | ICD-10-CM | POA: Diagnosis not present

## 2015-06-14 DIAGNOSIS — F329 Major depressive disorder, single episode, unspecified: Secondary | ICD-10-CM | POA: Diagnosis not present

## 2015-06-14 DIAGNOSIS — F1010 Alcohol abuse, uncomplicated: Secondary | ICD-10-CM | POA: Diagnosis not present

## 2015-06-14 DIAGNOSIS — J449 Chronic obstructive pulmonary disease, unspecified: Secondary | ICD-10-CM | POA: Diagnosis not present

## 2015-06-14 DIAGNOSIS — Z7984 Long term (current) use of oral hypoglycemic drugs: Secondary | ICD-10-CM | POA: Diagnosis not present

## 2015-06-14 DIAGNOSIS — F419 Anxiety disorder, unspecified: Secondary | ICD-10-CM | POA: Diagnosis not present

## 2015-06-14 DIAGNOSIS — I129 Hypertensive chronic kidney disease with stage 1 through stage 4 chronic kidney disease, or unspecified chronic kidney disease: Secondary | ICD-10-CM | POA: Diagnosis not present

## 2015-06-14 DIAGNOSIS — R32 Unspecified urinary incontinence: Secondary | ICD-10-CM | POA: Diagnosis not present

## 2015-06-16 ENCOUNTER — Encounter: Payer: Self-pay | Admitting: Obstetrics and Gynecology

## 2015-06-16 ENCOUNTER — Ambulatory Visit (INDEPENDENT_AMBULATORY_CARE_PROVIDER_SITE_OTHER): Payer: Medicare Other | Admitting: Obstetrics and Gynecology

## 2015-06-16 VITALS — BP 132/89 | HR 106 | Ht 68.0 in | Wt 262.0 lb

## 2015-06-16 DIAGNOSIS — F5101 Primary insomnia: Secondary | ICD-10-CM | POA: Diagnosis not present

## 2015-06-16 DIAGNOSIS — N898 Other specified noninflammatory disorders of vagina: Secondary | ICD-10-CM

## 2015-06-16 DIAGNOSIS — E1142 Type 2 diabetes mellitus with diabetic polyneuropathy: Secondary | ICD-10-CM | POA: Diagnosis not present

## 2015-06-16 DIAGNOSIS — J449 Chronic obstructive pulmonary disease, unspecified: Secondary | ICD-10-CM | POA: Diagnosis not present

## 2015-06-16 DIAGNOSIS — R21 Rash and other nonspecific skin eruption: Secondary | ICD-10-CM | POA: Diagnosis not present

## 2015-06-16 DIAGNOSIS — C7A010 Malignant carcinoid tumor of the duodenum: Secondary | ICD-10-CM | POA: Diagnosis not present

## 2015-06-16 DIAGNOSIS — N3001 Acute cystitis with hematuria: Secondary | ICD-10-CM | POA: Diagnosis not present

## 2015-06-16 DIAGNOSIS — F1310 Sedative, hypnotic or anxiolytic abuse, uncomplicated: Secondary | ICD-10-CM | POA: Diagnosis not present

## 2015-06-16 MED ORDER — HYDROCORTISONE 0.5 % EX CREA: 1.0000 "application " | TOPICAL_CREAM | Freq: Two times a day (BID) | CUTANEOUS | Status: DC | PRN

## 2015-06-16 MED ORDER — MELOXICAM 15 MG PO TABS: 15.0000 mg | ORAL_TABLET | Freq: Two times a day (BID) | ORAL | Status: DC | PRN

## 2015-06-16 MED ORDER — BISMUTH SUBSALICYLATE 525 MG/15ML PO SUSP: 15.0000 mL | ORAL | Status: DC | PRN

## 2015-06-19 DIAGNOSIS — R32 Unspecified urinary incontinence: Secondary | ICD-10-CM | POA: Diagnosis not present

## 2015-06-19 DIAGNOSIS — J449 Chronic obstructive pulmonary disease, unspecified: Secondary | ICD-10-CM | POA: Diagnosis not present

## 2015-06-19 DIAGNOSIS — I951 Orthostatic hypotension: Secondary | ICD-10-CM | POA: Diagnosis not present

## 2015-06-19 DIAGNOSIS — E1122 Type 2 diabetes mellitus with diabetic chronic kidney disease: Secondary | ICD-10-CM | POA: Diagnosis not present

## 2015-06-19 DIAGNOSIS — G9340 Encephalopathy, unspecified: Secondary | ICD-10-CM | POA: Diagnosis not present

## 2015-06-19 DIAGNOSIS — F329 Major depressive disorder, single episode, unspecified: Secondary | ICD-10-CM | POA: Diagnosis not present

## 2015-06-19 DIAGNOSIS — N189 Chronic kidney disease, unspecified: Secondary | ICD-10-CM | POA: Diagnosis not present

## 2015-06-19 DIAGNOSIS — K7460 Unspecified cirrhosis of liver: Secondary | ICD-10-CM | POA: Diagnosis not present

## 2015-06-19 DIAGNOSIS — F419 Anxiety disorder, unspecified: Secondary | ICD-10-CM | POA: Diagnosis not present

## 2015-06-19 DIAGNOSIS — I129 Hypertensive chronic kidney disease with stage 1 through stage 4 chronic kidney disease, or unspecified chronic kidney disease: Secondary | ICD-10-CM | POA: Diagnosis not present

## 2015-06-19 DIAGNOSIS — Z7984 Long term (current) use of oral hypoglycemic drugs: Secondary | ICD-10-CM | POA: Diagnosis not present

## 2015-06-19 DIAGNOSIS — F1010 Alcohol abuse, uncomplicated: Secondary | ICD-10-CM | POA: Diagnosis not present

## 2015-06-19 NOTE — Progress Notes (Signed)
GYNECOLOGY PROGRESS NOTE  Subjective:    Patient ID: [name], female    DOB: [birth year], 59 y.o.   MRN: [id number]  HPI  Patient is a 59 y.o. P44 female who presents for complaints of vaginal discharge with itching. This has been an ongoing problem.  Patient with h/o rectovaginal fistula.  Also reports that she has been followed at [facility], and that she was recently diagnosed with colon cancer, and is going to have to undergo surgery soon.  Has been having diarrhea x 1-2 weeks since colonoscopy/endoscopy was performed. Has tried Pepto-Bismol and Immodium without relief.   Notes also recently being treated for a UTI.   The following portions of the patient's history were reviewed and updated as appropriate: allergies, current medications, past family history, past medical history, past social history, past surgical history and problem list.  Review of Systems Pertinent items noted in HPI and remainder of comprehensive ROS otherwise negative except abdominal pain.   Objective:   Blood pressure 132/89, pulse 106, height 5\' 8"  (1.727 m), weight 262 lb (118.842 kg). General appearance: alert and no distress Pelvic: external genitalia normal, rectovaginal septum normal and vagina with red/brown purulent discharge in vaginal vault. Bimanual exam not done.    Assessment:   Vaginal discharge  Abdominal pain  Plan:   Vaginal charge cultured for anaerobes and aerobes, as well as NuSwab performed.  Will notify patient by phone of results. Also encouraged to use Vagisil for irritation, and prescribed hydrocortisone cream for itching and inflammation.  Patient requests pain medication.  Have previously informed patient that I would no longer be prescribing her narcotic pain meds.  Requests Tramadol.  Discussed that with medications patient is on, there is an increased risk of seizures.  Will not prescribe.  Will give prescription for Mobic.  Advised to f/u with pain clinic (patient notes she  still has to see Psychiatrist first prior to them prescribing medication).    Rubie Maid, MD [facility]

## 2015-06-20 ENCOUNTER — Telehealth: Payer: Self-pay | Admitting: Obstetrics and Gynecology

## 2015-06-20 DIAGNOSIS — B373 Candidiasis of vulva and vagina: Secondary | ICD-10-CM

## 2015-06-20 DIAGNOSIS — B3731 Acute candidiasis of vulva and vagina: Secondary | ICD-10-CM

## 2015-06-20 LAB — NUSWAB VAGINITIS PLUS (VG+)
CANDIDA ALBICANS, NAA: POSITIVE — AB
CANDIDA GLABRATA, NAA: POSITIVE — AB

## 2015-06-20 NOTE — Telephone Encounter (Signed)
Pt called and she would like a call back, she stated she has some really important things she would like to discuss with you, sloe the problem she was seen for last week is not better its getting worse.

## 2015-06-20 NOTE — Telephone Encounter (Signed)
Patient called again stating she needs to speak with you asap.Thanks

## 2015-06-21 ENCOUNTER — Telehealth: Payer: Self-pay | Admitting: Obstetrics and Gynecology

## 2015-06-21 ENCOUNTER — Encounter: Payer: Self-pay | Admitting: Emergency Medicine

## 2015-06-21 DIAGNOSIS — K219 Gastro-esophageal reflux disease without esophagitis: Secondary | ICD-10-CM | POA: Diagnosis not present

## 2015-06-21 DIAGNOSIS — Z881 Allergy status to other antibiotic agents status: Secondary | ICD-10-CM

## 2015-06-21 DIAGNOSIS — Z880 Allergy status to penicillin: Secondary | ICD-10-CM | POA: Diagnosis not present

## 2015-06-21 DIAGNOSIS — J449 Chronic obstructive pulmonary disease, unspecified: Secondary | ICD-10-CM | POA: Diagnosis not present

## 2015-06-21 DIAGNOSIS — A419 Sepsis, unspecified organism: Secondary | ICD-10-CM | POA: Diagnosis not present

## 2015-06-21 DIAGNOSIS — Z882 Allergy status to sulfonamides status: Secondary | ICD-10-CM

## 2015-06-21 DIAGNOSIS — G40909 Epilepsy, unspecified, not intractable, without status epilepticus: Secondary | ICD-10-CM | POA: Diagnosis not present

## 2015-06-21 DIAGNOSIS — F17200 Nicotine dependence, unspecified, uncomplicated: Secondary | ICD-10-CM | POA: Diagnosis not present

## 2015-06-21 DIAGNOSIS — Z6838 Body mass index (BMI) 38.0-38.9, adult: Secondary | ICD-10-CM

## 2015-06-21 DIAGNOSIS — Z90710 Acquired absence of both cervix and uterus: Secondary | ICD-10-CM | POA: Diagnosis not present

## 2015-06-21 DIAGNOSIS — I10 Essential (primary) hypertension: Secondary | ICD-10-CM | POA: Diagnosis present

## 2015-06-21 DIAGNOSIS — Z9102 Food additives allergy status: Secondary | ICD-10-CM

## 2015-06-21 DIAGNOSIS — Z9049 Acquired absence of other specified parts of digestive tract: Secondary | ICD-10-CM

## 2015-06-21 DIAGNOSIS — N390 Urinary tract infection, site not specified: Principal | ICD-10-CM | POA: Diagnosis present

## 2015-06-21 LAB — ANAEROBIC AND AEROBIC CULTURE

## 2015-06-21 NOTE — ED Notes (Signed)
Pt arrived to the ED via EMS from home for complaints of lower back pain and painful urination. Pt reports that she was seen at [facility] 7 days and diagnosed with a UTI but did not take the medication prescribed because she did not have the money to buy it. According to the Pt she called her primary doctor and he recommended her to come to the ED and get some "fluids." Pt is AOx4 in no apparent distress.

## 2015-06-21 NOTE — Telephone Encounter (Signed)
Patient called requesting results ASAP. Thanks

## 2015-06-21 NOTE — Telephone Encounter (Signed)
Please inform patient of yeast infection, will treat with Fluconazole 150 mg q 72 hrs x 3 doses, then take 1 dose every week x 6 months. Please inform patient that I will be out of town the rest of the week.  Still waiting on other cultures to finalize and return.

## 2015-06-22 ENCOUNTER — Inpatient Hospital Stay
Admission: EM | Admit: 2015-06-22 | Discharge: 2015-06-22 | DRG: 690 | Disposition: A | Discharge status: Home / Self Care | Payer: Medicare Other | Attending: Internal Medicine | Admitting: Internal Medicine

## 2015-06-22 ENCOUNTER — Encounter: Payer: Self-pay | Admitting: Internal Medicine

## 2015-06-22 DIAGNOSIS — I10 Essential (primary) hypertension: Secondary | ICD-10-CM | POA: Diagnosis present

## 2015-06-22 DIAGNOSIS — F17200 Nicotine dependence, unspecified, uncomplicated: Secondary | ICD-10-CM | POA: Diagnosis present

## 2015-06-22 DIAGNOSIS — Z9102 Food additives allergy status: Secondary | ICD-10-CM | POA: Diagnosis not present

## 2015-06-22 DIAGNOSIS — K219 Gastro-esophageal reflux disease without esophagitis: Secondary | ICD-10-CM | POA: Diagnosis present

## 2015-06-22 DIAGNOSIS — Z90710 Acquired absence of both cervix and uterus: Secondary | ICD-10-CM | POA: Diagnosis not present

## 2015-06-22 DIAGNOSIS — Z9049 Acquired absence of other specified parts of digestive tract: Secondary | ICD-10-CM | POA: Diagnosis not present

## 2015-06-22 DIAGNOSIS — A419 Sepsis, unspecified organism: Secondary | ICD-10-CM | POA: Diagnosis present

## 2015-06-22 DIAGNOSIS — Z880 Allergy status to penicillin: Secondary | ICD-10-CM | POA: Diagnosis not present

## 2015-06-22 DIAGNOSIS — Z6838 Body mass index (BMI) 38.0-38.9, adult: Secondary | ICD-10-CM | POA: Diagnosis not present

## 2015-06-22 DIAGNOSIS — Z882 Allergy status to sulfonamides status: Secondary | ICD-10-CM | POA: Diagnosis not present

## 2015-06-22 DIAGNOSIS — G40909 Epilepsy, unspecified, not intractable, without status epilepticus: Secondary | ICD-10-CM | POA: Diagnosis present

## 2015-06-22 DIAGNOSIS — N390 Urinary tract infection, site not specified: Secondary | ICD-10-CM | POA: Diagnosis present

## 2015-06-22 DIAGNOSIS — J449 Chronic obstructive pulmonary disease, unspecified: Secondary | ICD-10-CM | POA: Diagnosis present

## 2015-06-22 DIAGNOSIS — Z881 Allergy status to other antibiotic agents status: Secondary | ICD-10-CM | POA: Diagnosis not present

## 2015-06-22 LAB — COMPREHENSIVE METABOLIC PANEL
ALBUMIN: 3.6 g/dL (ref 3.5–5.0)
ALK PHOS: 180 U/L — AB (ref 38–126)
ALT: 35 U/L (ref 14–54)
ANION GAP: 7 (ref 5–15)
AST: 69 U/L — ABNORMAL HIGH (ref 15–41)
BUN: 6 mg/dL (ref 6–20)
CO2: 23 mmol/L (ref 22–32)
Calcium: 8.9 mg/dL (ref 8.9–10.3)
Chloride: 109 mmol/L (ref 101–111)
Creatinine, Ser: 0.82 mg/dL (ref 0.44–1.00)
GFR calc Af Amer: >60 mL/min (ref >60)
GFR calc non Af Amer: >60 mL/min (ref >60)
GLUCOSE: 192 mg/dL — AB (ref 65–99)
POTASSIUM: 3.6 mmol/L (ref 3.5–5.1)
SODIUM: 139 mmol/L (ref 135–145)
Total Bilirubin: 0.9 mg/dL (ref 0.3–1.2)
Total Protein: 8 g/dL (ref 6.5–8.1)

## 2015-06-22 LAB — CBC
HCT: 26 % — ABNORMAL LOW (ref 35.0–47.0)
HCT: 31.8 % — ABNORMAL LOW (ref 35.0–47.0)
HEMOGLOBIN: 8 g/dL — AB (ref 12.0–16.0)
HEMOGLOBIN: 9.9 g/dL — AB (ref 12.0–16.0)
MCH: 23.5 pg — AB (ref 26.0–34.0)
MCH: 24.1 pg — ABNORMAL LOW (ref 26.0–34.0)
MCHC: 30.6 g/dL — ABNORMAL LOW (ref 32.0–36.0)
MCHC: 31.2 g/dL — AB (ref 32.0–36.0)
MCV: 76.9 fL — ABNORMAL LOW (ref 80.0–100.0)
MCV: 77.4 fL — ABNORMAL LOW (ref 80.0–100.0)
PLATELETS: 103 10*3/uL — AB (ref 150–440)
Platelets: 122 10*3/uL — ABNORMAL LOW (ref 150–440)
RBC: 3.39 MIL/uL — AB (ref 3.80–5.20)
RBC: 4.11 MIL/uL (ref 3.80–5.20)
RDW: 19.2 % — ABNORMAL HIGH (ref 11.5–14.5)
RDW: 19.3 % — AB (ref 11.5–14.5)
WBC: 4.6 10*3/uL (ref 3.6–11.0)
WBC: 5.2 10*3/uL (ref 3.6–11.0)

## 2015-06-22 LAB — BASIC METABOLIC PANEL
ANION GAP: 5 (ref 5–15)
BUN: 6 mg/dL (ref 6–20)
CALCIUM: 8 mg/dL — AB (ref 8.9–10.3)
CHLORIDE: 109 mmol/L (ref 101–111)
CO2: 25 mmol/L (ref 22–32)
CREATININE: 0.77 mg/dL (ref 0.44–1.00)
GFR calc non Af Amer: >60 mL/min (ref >60)
Glucose, Bld: 170 mg/dL — ABNORMAL HIGH (ref 65–99)
Potassium: 3.5 mmol/L (ref 3.5–5.1)
SODIUM: 139 mmol/L (ref 135–145)

## 2015-06-22 LAB — URINALYSIS COMPLETE WITH MICROSCOPIC (ARMC ONLY)
Bilirubin Urine: NEGATIVE
Glucose, UA: NEGATIVE mg/dL
HGB URINE DIPSTICK: NEGATIVE
Ketones, ur: NEGATIVE mg/dL
Nitrite: POSITIVE — AB
PH: 6 (ref 5.0–8.0)
Protein, ur: NEGATIVE mg/dL
RBC / HPF: NONE SEEN RBC/hpf (ref 0–5)
Specific Gravity, Urine: 1.005 (ref 1.005–1.030)

## 2015-06-22 LAB — C DIFFICILE QUICK SCREEN W PCR REFLEX
C DIFFICILE (CDIFF) INTERP: NEGATIVE
C Diff antigen: NEGATIVE
C Diff toxin: NEGATIVE

## 2015-06-22 LAB — LACTIC ACID, PLASMA
LACTIC ACID, VENOUS: 2 mmol/L (ref 0.5–2.0)
LACTIC ACID, VENOUS: 3.7 mmol/L — AB (ref 0.5–2.0)

## 2015-06-22 MED ORDER — MORPHINE SULFATE (PF) 2 MG/ML IV SOLN: 2.0000 mg | Freq: Once | INTRAVENOUS | Status: DC

## 2015-06-22 MED ORDER — CEFUROXIME AXETIL 500 MG PO TABS
500.0000 mg | ORAL_TABLET | Freq: Two times a day (BID) | ORAL | Status: DC
  Administered 2015-06-22: 500 mg via ORAL
  Filled 2015-06-22 (×2): qty 1

## 2015-06-22 MED ORDER — LINEZOLID 600 MG/300ML IV SOLN
600.0000 mg | Freq: Two times a day (BID) | INTRAVENOUS | Status: DC
  Administered 2015-06-22: 600 mg via INTRAVENOUS
  Filled 2015-06-22 (×3): qty 300

## 2015-06-22 MED ORDER — INFLUENZA VAC SPLIT QUAD 0.5 ML IM SUSY: 0.5000 mL | PREFILLED_SYRINGE | INTRAMUSCULAR | Status: DC

## 2015-06-22 MED ORDER — DIPHENHYDRAMINE HCL 25 MG PO CAPS: 25.0000 mg | ORAL_CAPSULE | ORAL | Status: DC | PRN

## 2015-06-22 MED ORDER — GABAPENTIN 300 MG PO CAPS
300.0000 mg | ORAL_CAPSULE | Freq: Three times a day (TID) | ORAL | Status: DC
  Administered 2015-06-22 (×2): 300 mg via ORAL
  Filled 2015-06-22 (×2): qty 1

## 2015-06-22 MED ORDER — PANTOPRAZOLE SODIUM 40 MG PO TBEC
40.0000 mg | DELAYED_RELEASE_TABLET | Freq: Every day | ORAL | Status: DC
  Administered 2015-06-22: 40 mg via ORAL
  Filled 2015-06-22: qty 1

## 2015-06-22 MED ORDER — SODIUM CHLORIDE 0.9 % IV SOLN
INTRAVENOUS | Status: DC
  Administered 2015-06-22 (×2): via INTRAVENOUS

## 2015-06-22 MED ORDER — MORPHINE SULFATE (PF) 2 MG/ML IV SOLN: 2.0000 mg | Freq: Three times a day (TID) | INTRAVENOUS | Status: DC | PRN

## 2015-06-22 MED ORDER — HYDROCORTISONE 0.5 % EX CREA
1.0000 "application " | TOPICAL_CREAM | Freq: Two times a day (BID) | CUTANEOUS | Status: DC | PRN
  Filled 2015-06-22: qty 28.35

## 2015-06-22 MED ORDER — ALPRAZOLAM 0.5 MG PO TABS
1.0000 mg | ORAL_TABLET | Freq: Two times a day (BID) | ORAL | Status: DC
  Administered 2015-06-22 (×2): 1 mg via ORAL
  Filled 2015-06-22 (×2): qty 2

## 2015-06-22 MED ORDER — LACTULOSE 10 GM/15ML PO SOLN: 20.0000 g | Freq: Every day | ORAL | Status: DC | PRN

## 2015-06-22 MED ORDER — DEXTROSE 5 % IV SOLN: 1.0000 g | INTRAVENOUS | Status: DC

## 2015-06-22 MED ORDER — TIOTROPIUM BROMIDE MONOHYDRATE 18 MCG IN CAPS: 18.0000 ug | ORAL_CAPSULE | Freq: Two times a day (BID) | RESPIRATORY_TRACT | Status: DC | PRN

## 2015-06-22 MED ORDER — HEPARIN SODIUM (PORCINE) 5000 UNIT/ML IJ SOLN
5000.0000 [IU] | Freq: Three times a day (TID) | INTRAMUSCULAR | Status: DC
  Administered 2015-06-22 (×2): 5000 [IU] via SUBCUTANEOUS
  Filled 2015-06-22 (×2): qty 1

## 2015-06-22 MED ORDER — ACETAMINOPHEN 325 MG PO TABS: 650.0000 mg | ORAL_TABLET | Freq: Four times a day (QID) | ORAL | Status: DC | PRN

## 2015-06-22 MED ORDER — DEXTROSE 5 % IV SOLN
1.0000 g | INTRAVENOUS | Status: DC
  Administered 2015-06-22: 1 g via INTRAVENOUS
  Filled 2015-06-22: qty 10

## 2015-06-22 MED ORDER — PROPRANOLOL HCL 20 MG PO TABS
10.0000 mg | ORAL_TABLET | Freq: Two times a day (BID) | ORAL | Status: DC
  Filled 2015-06-22: qty 1

## 2015-06-22 MED ORDER — PREDNISONE 5 MG PO TABS
5.0000 mg | ORAL_TABLET | Freq: Every day | ORAL | Status: DC
  Administered 2015-06-22: 5 mg via ORAL
  Filled 2015-06-22: qty 1

## 2015-06-22 MED ORDER — LEVETIRACETAM 500 MG PO TABS
500.0000 mg | ORAL_TABLET | Freq: Two times a day (BID) | ORAL | Status: DC
  Administered 2015-06-22 (×2): 500 mg via ORAL
  Filled 2015-06-22 (×2): qty 1

## 2015-06-22 MED ORDER — CEFUROXIME AXETIL 500 MG PO TABS: 500.0000 mg | ORAL_TABLET | Freq: Two times a day (BID) | ORAL | Status: DC

## 2015-06-22 MED ORDER — OXYCODONE HCL 5 MG PO TABS
5.0000 mg | ORAL_TABLET | ORAL | Status: DC | PRN
  Administered 2015-06-22 (×2): 5 mg via ORAL
  Filled 2015-06-22 (×2): qty 1

## 2015-06-22 MED ORDER — PRAVASTATIN SODIUM 20 MG PO TABS
10.0000 mg | ORAL_TABLET | Freq: Every day | ORAL | Status: DC
  Administered 2015-06-22: 10 mg via ORAL
  Filled 2015-06-22: qty 1

## 2015-06-22 MED ORDER — PANCRELIPASE (LIP-PROT-AMYL) 12000-38000 UNITS PO CPEP
24000.0000 [IU] | ORAL_CAPSULE | Freq: Three times a day (TID) | ORAL | Status: DC
  Administered 2015-06-22 (×2): 24000 [IU] via ORAL
  Filled 2015-06-22 (×2): qty 2

## 2015-06-22 MED ORDER — FLUCONAZOLE 150 MG PO TABS: ORAL_TABLET | ORAL | Status: DC

## 2015-06-22 MED ORDER — ALBUTEROL SULFATE HFA 108 (90 BASE) MCG/ACT IN AERS: 2.0000 | INHALATION_SPRAY | RESPIRATORY_TRACT | Status: DC | PRN

## 2015-06-22 MED ORDER — AMITRIPTYLINE HCL 25 MG PO TABS: 50.0000 mg | ORAL_TABLET | Freq: Every day | ORAL | Status: DC

## 2015-06-22 MED ORDER — ZOLPIDEM TARTRATE 5 MG PO TABS: 10.0000 mg | ORAL_TABLET | Freq: Every day | ORAL | Status: DC

## 2015-06-22 MED ORDER — PROMETHAZINE HCL 25 MG PO TABS
12.5000 mg | ORAL_TABLET | Freq: Four times a day (QID) | ORAL | Status: DC | PRN
  Administered 2015-06-22: 12.5 mg via ORAL
  Filled 2015-06-22: qty 1

## 2015-06-22 MED ORDER — SODIUM CHLORIDE 0.9 % IV BOLUS (SEPSIS)
1000.0000 mL | Freq: Once | INTRAVENOUS | Status: AC
  Administered 2015-06-22: 1000 mL via INTRAVENOUS

## 2015-06-22 MED ORDER — ALBUTEROL SULFATE (2.5 MG/3ML) 0.083% IN NEBU: 2.5000 mg | INHALATION_SOLUTION | RESPIRATORY_TRACT | Status: DC | PRN

## 2015-06-22 MED ORDER — SERTRALINE HCL 50 MG PO TABS
50.0000 mg | ORAL_TABLET | Freq: Every day | ORAL | Status: DC
  Administered 2015-06-22: 50 mg via ORAL
  Filled 2015-06-22: qty 1

## 2015-06-22 MED ORDER — OXYCODONE HCL 5 MG PO TABS
20.0000 mg | ORAL_TABLET | Freq: Three times a day (TID) | ORAL | Status: DC | PRN
Start: 2015-06-22 — End: 2015-06-22

## 2015-06-22 MED ORDER — MELOXICAM 7.5 MG PO TABS
15.0000 mg | ORAL_TABLET | Freq: Two times a day (BID) | ORAL | Status: DC | PRN
  Administered 2015-06-22: 15 mg via ORAL
  Filled 2015-06-22: qty 2

## 2015-06-22 MED ORDER — MOMETASONE FURO-FORMOTEROL FUM 200-5 MCG/ACT IN AERO
2.0000 | INHALATION_SPRAY | Freq: Two times a day (BID) | RESPIRATORY_TRACT | Status: DC
  Administered 2015-06-22: 2 via RESPIRATORY_TRACT
  Filled 2015-06-22 (×2): qty 8.8

## 2015-06-22 MED ORDER — METOPROLOL SUCCINATE ER 25 MG PO TB24
25.0000 mg | ORAL_TABLET | Freq: Every day | ORAL | Status: DC
  Administered 2015-06-22: 25 mg via ORAL
  Filled 2015-06-22: qty 1

## 2015-06-22 MED ORDER — POLYETHYLENE GLYCOL 3350 17 G PO PACK: 17.0000 g | PACK | Freq: Every day | ORAL | Status: DC | PRN

## 2015-06-22 MED ORDER — PAROXETINE HCL 20 MG PO TABS: 20.0000 mg | ORAL_TABLET | Freq: Every day | ORAL | Status: DC

## 2015-06-22 MED ORDER — ACETAMINOPHEN 650 MG RE SUPP
650.0000 mg | Freq: Four times a day (QID) | RECTAL | Status: DC | PRN
Start: 2015-06-22 — End: 2015-06-22

## 2015-06-22 MED ORDER — MORPHINE SULFATE (PF) 2 MG/ML IV SOLN
2.0000 mg | INTRAVENOUS | Status: DC | PRN
  Administered 2015-06-22: 2 mg via INTRAVENOUS
  Filled 2015-06-22: qty 1

## 2015-06-22 NOTE — Discharge Summary (Signed)
[facility] [place] NAME: [name]    MR#:  [id number]  DATE OF BIRTH:  31-Mar-1956  DATE OF ADMISSION:  06/22/2015 ADMITTING [name]: Jaime [place], MD  DATE OF DISCHARGE: 06/22/15  PRIMARY CARE [name]: Jaime Battiest, NP    ADMISSION DIAGNOSIS:  lower back pain uti  DISCHARGE DIAGNOSIS:  Recurrent UTI Chronic pain syndrome with history of drug abuse. History of rectovaginal fistula Morbid obesity  SECONDARY DIAGNOSIS:   Past Medical History  Diagnosis Date  . Emphysema of lung ([place])   . Depression   . Diverticulitis   . Chronic bronchitis ([place])   . Malignant brain tumor ([place]) 2007  . GERD (gastroesophageal reflux disease)   . Allergy   . Hepatitis C   . Hypertension   . Chronic kidney disease   . Migraines   . Urine incontinence   . Seizures ([place])   . Stroke [name]) 2007    during brain surgery  . Pancreatitis, alcoholic 0000000  . Rectovaginal fistula   . H/O ETOH abuse     Sober since 2009  . H/O drug abuse     Clean since 2009  . Pancreatic ascites   . Cirrhosis [name])     [name] COURSE:   [name] history of essential hypertension COPD unspecified who is presenting with dysuria. She is previously seen [name] diagnosed urinary tract infection however did not take antibiotics as prescribed thus presented with worsening symptoms.  1. Sepsis, meeting septic criteria by heart rate, respiratory rate present on arrival.  -Sepsis resolved. Blood cultures negative. -Received IV Rocephin patient tolerated well known side effects noted. We'll change to by mouth Ceftin. Patient was recently seen by primary care [name] at [name] on 06/16/2015 and urine culture grew Escherichia coli and Klebsiella. According to the cultures patient will be able to complete a course with by mouth Ceftin -Please note patient did not pick up the antibiotics that was prescribed by her [name] primary care  [name] on 06/16/2015. -Hemodynamically stable. -Urine culture negative so far.  Blood culture negative.  2. COPD not in acute exacerbation - Continue with Advair, Spiriva  3. Seizure disorder continue home medications Continue home meds  4. Venous embolism prophylactic: Heparin subcutaneous  5. Chronic pain syndrome. Patient has history of drug-seeking behavior. She has been asking for pain meds since admission. I have switched back to her oral oxycodone. I will not be prescribing her any pain meds to go home but she is asked to call her primary care [name]'s office for pain medication.  Overall hemogram is stable. Patient will be discharged home today  CONSULTS OBTAINED:   none  DRUG ALLERGIES:   Allergies  Allergen Reactions  . Ciprofloxacin Itching  . Dilaudid [Hydromorphone Hcl] Itching  . Hydromorphone Itching  . Ibuprofen Other (See Comments)    Other reaction(s): Other (See Comments) Cannot take because of liver disease Cannot take because of liver disease   . Sulfa Antibiotics Hives  . Zofran [Ondansetron Hcl] Itching    Other reaction(s): Other (See Comments) Do not use; vomiting per pt  . Zofran [Ondansetron] Other (See Comments)    Do not use; vomiting per pt  . Amoxicillin Rash  . Chocolate Rash  . Fentanyl Rash  . Metformin Nausea Only  . Penicillins Rash and Other (See Comments)    Has patient had a PCN reaction causing immediate rash, facial/tongue/throat swelling, SOB or lightheadedness with hypotension: [id number] Has  patient had a PCN reaction causing severe rash involving mucus membranes or skin necrosis: [id number] Has patient had a PCN reaction that required hospitalization [id number] Has patient had a PCN reaction occurring within the last 10 years: [id number] If all of the above answers are "NO", then may proceed with Cephalosporin use.   . Strawberry (Diagnostic) Rash  . Strawberry Extract Rash    DISCHARGE MEDICATIONS:    Current Discharge Medication List    START taking these medications   Details  cefUROXime (CEFTIN) 500 MG tablet Take 1 tablet (500 mg total) by mouth 2 (two) times daily with a meal. Qty: 14 tablet, Refills: 0      CONTINUE these medications which have NOT CHANGED   Details  ADVAIR DISKUS 500-50 MCG/DOSE AEPB inhale 1 dose by mouth twice a day Refills: 0    albuterol (PROAIR HFA) 108 (90 BASE) MCG/ACT inhaler Inhale 2 puffs into the lungs every 4 (four) hours as needed.     amitriptyline (ELAVIL) 50 MG tablet Take 1 tablet (50 mg total) by mouth at bedtime. Qty: 30 tablet, Refills: 3    Bismuth Subsalicylate (KAOPECTATE EXTRA STRENGTH) 525 MG/15ML SUSP Take 15 mLs (525 mg total) by mouth every 2 (two) hours as needed. Qty: 420 mL, Refills: 0    CREON 12000 UNITS CPEP Take 24,000 Units by mouth 3 (three) times daily before meals.     furosemide (LASIX) 20 MG tablet Take 20 mg by mouth daily.    gabapentin (NEURONTIN) 300 MG capsule Take 300 mg by mouth 3 (three) times daily.     hydrocortisone cream 0.5 % Apply 1 application topically 2 (two) times daily as needed for itching. Qty: 30 g, Refills: 0    Ipratropium-Albuterol (COMBIVENT) 20-100 MCG/ACT AERS respimat Inhale into the lungs.    lactulose (CHRONULAC) 10 GM/15ML solution Take 20 g by mouth daily as needed for mild constipation.     levETIRAcetam (KEPPRA) 500 MG tablet Take 1 tablet (500 mg total) by mouth 2 (two) times daily. Qty: 60 tablet, Refills: 3   Associated Diagnoses: Seizure disorder (HCC)    meloxicam (MOBIC) 15 MG tablet Take 1 tablet (15 mg total) by mouth 2 (two) times daily as needed for pain. Qty: 60 tablet, Refills: 3    metoprolol succinate (TOPROL-XL) 25 MG 24 hr tablet Take 1 tablet (25 mg total) by mouth daily. Qty: 30 tablet, Refills: 4    omeprazole (PRILOSEC) 20 MG capsule Take 1 capsule (20 mg total) by mouth daily. Qty: 30 capsule, Refills: 11    Oxycodone HCl 20 MG TABS Take 1  tablet by mouth every 8 (eight) hours as needed. Refills: 0    pravastatin (PRAVACHOL) 10 MG tablet Take 10 mg by mouth daily.     sertraline (ZOLOFT) 50 MG tablet Take 50 mg by mouth daily.    tiotropium (SPIRIVA) 18 MCG inhalation capsule Place 18 mcg into inhaler and inhale 2 (two) times daily as needed (shortness of breath).     triamcinolone (NASACORT ALLERGY 24HR) 55 MCG/ACT AERO nasal inhaler Place 2 sprays into the nose daily as needed (allergies).    zolpidem (AMBIEN) 10 MG tablet Take 1 tablet (10 mg total) by mouth at bedtime. Qty: 30 tablet, Refills: 3    fluconazole (DIFLUCAN) 150 MG tablet Take 1 tablet by mouth weekly Qty: 24 tablet, Refills: 0      STOP taking these medications     ALPRAZolam (XANAX) 1 MG tablet      predniSONE (  DELTASONE) 5 MG tablet         If you experience worsening of your admission symptoms, develop shortness of breath, life threatening emergency, suicidal or homicidal thoughts you must seek medical attention immediately by calling 911 or calling your MD immediately  if symptoms less severe.  You Must read complete instructions/literature along with all the possible adverse reactions/side effects for all the Medicines you take and that have been prescribed to you. Take any new Medicines after you have completely understood and accept all the possible adverse reactions/side effects.   Please note  You were cared for by a hospitalist during your [name] stay. If you have any questions about your discharge medications or the care you received while you were in the [name] after you are discharged, you can call the unit and asked to speak with the hospitalist on call if the hospitalist that took care of you is not available. Once you are discharged, your primary care [name] will handle any further medical issues. Please note that NO REFILLS for any discharge medications will be authorized once you are discharged, as it is imperative that you  return to your primary care [name] (or establish a relationship with a primary care [name] if you do not have one) for your aftercare needs so that they can reassess your need for medications and monitor your lab values. Today   SUBJECTIVE   My back is hurting cannot have some pain medication  VITAL SIGNS:  Blood pressure 126/67, pulse 108, temperature 97.9 F (36.6 C), temperature source Oral, resp. rate 20, height 5\' 8"  (1.727 m), weight 251 lb 8 oz (114.08 kg), SpO2 94 %.  I/O:    Intake/Output Summary (Last 24 hours) at 06/22/15 1357 Last data filed at 06/22/15 0942  Gross per 24 hour  Intake    120 ml  Output    201 ml  Net    -81 ml    PHYSICAL EXAMINATION:  GENERAL:  59 y.o.-year-old patient lying in the bed with no acute distress. Morbidly obese  EYES: Pupils equal, round, reactive to light and accommodation. No scleral icterus. Extraocular muscles intact.  HEENT: Head atraumatic, normocephalic. Oropharynx and nasopharynx clear.  NECK:  Supple, no jugular venous distention. No thyroid enlargement, no tenderness.  LUNGS:Distantl breath sounds bilaterally, no wheezing, rales,rhonchi or crepitation. No use of accessory muscles of respiration.  CARDIOVASCULAR: S1, S2 normal. No murmurs, rubs, or gallops.  ABDOMEN: Soft, non-tender, non-distended. Bowel sounds present. No organomegaly or mass.  EXTREMITIES: No pedal edema, cyanosis, or clubbing.  NEUROLOGIC: Cranial nerves II through XII are intact. Muscle strength 5/5 in all extremities. Sensation intact. Gait not checked.  PSYCHIATRIC: The patient is alert and oriented x 3.  SKIN: No obvious rash, lesion, or ulcer.   DATA REVIEW:   CBC   Recent Labs Lab 06/22/15 0547  WBC 4.6  HGB 8.0*  HCT 26.0*  PLT 103*    Chemistries   Recent Labs Lab 06/21/15 2332 06/22/15 0547  NA 139 139  K 3.6 3.5  CL 109 109  CO2 23 25  GLUCOSE 192* 170*  BUN 6 6  CREATININE 0.82 0.77  CALCIUM 8.9 8.0*  AST 69*  --    ALT 35  --   ALKPHOS 180*  --   BILITOT 0.9  --     Microbiology Results   Recent Results (from the past 240 hour(s))  Anaerobic and Aerobic Culture     Status: Abnormal   Collection Time: 06/16/15  11:52 AM  Result Value Ref Range Status   Anaerobic Culture Final report (A)  Final   Result 1 Comment (A)  Final    Comment: Bacteroides species, Bacteroides fragilis group Heavy growth Studies at [name] have confirmed the observations of others who have demonstrated that Bacteroides fragilis group are routinely susceptible to Cefoxitin, Chloramphenicol, and Metronidazole and are usually resistant to Penicillin, and variably in resistance to Clindamycin.   Blood culture (routine x 2)     Status: None (Preliminary result)   Collection Time: 06/22/15 12:46 AM  Result Value Ref Range Status   Specimen Description BLOOD RIGHT ANTECUBITAL  Final   Special Requests BOTTLES DRAWN AEROBIC AND ANAEROBIC 5ML  Final   Culture NO GROWTH < 12 HOURS  Final   Report Status PENDING  Incomplete  Blood culture (routine x 2)     Status: None (Preliminary result)   Collection Time: 06/22/15  1:33 AM  Result Value Ref Range Status   Specimen Description BLOOD LEFT WRIST  Final   Special Requests   Final    BOTTLES DRAWN AEROBIC AND ANAEROBIC 5MLANABEROBIC, 10MAERBOIC   Culture NO GROWTH < 12 HOURS  Final   Report Status PENDING  Incomplete  Urine culture     Status: None (Preliminary result)   Collection Time: 06/22/15  4:35 AM  Result Value Ref Range Status   Specimen Description URINE, RANDOM  Final   Special Requests NONE  Final   Culture NO GROWTH < 12 HOURS  Final   Report Status PENDING  Incomplete  C difficile quick scan w PCR reflex     Status: None   Collection Time: 06/22/15  9:30 AM  Result Value Ref Range Status   C Diff antigen NEGATIVE NEGATIVE Final   C Diff toxin NEGATIVE NEGATIVE Final   C Diff interpretation Negative for C. difficile  Final    RADIOLOGY:  No results  found.   Management plans discussed with the patient, family and they are in agreement.  CODE STATUS:     Code Status Orders        Start     Ordered   06/22/15 0144  Full code   Continuous     06/22/15 0145      TOTAL TIME TAKING CARE OF THIS PATIENT: 40 minutes.    Michele Kerlin M.D on 06/22/2015 at 1:57 PM  Between 7am to 6pm - Pager - [phone] After 6pm go to [email or web address] - password [email or web address] [facility]  Office  [phone]  CC: Primary care [name]; Jaime Battiest, NP

## 2015-06-22 NOTE — ED Provider Notes (Signed)
[facility] Emergency Department Provider Note  ____________________________________________  Time seen: 12:30 AM  I have reviewed the triage vital signs and the nursing notes.   HISTORY  Chief Complaint Back Pain and Urinary Tract Infection     HPI [name] is a 59 y.o. female presents via EMS with low back pain and dysuria x 1 week. Patient recently diagnosed with a urinary tract infection at [facility] proximally 7 days ago she was informed by the physician there today that her insurance would not pay for the antibiotic that she needed and as such was informed to go to [facility].     Past Medical History  Diagnosis Date  . Emphysema of lung ([place])   . Depression   . Diverticulitis   . Chronic bronchitis ([place])   . Malignant brain tumor ([place]) 2007  . GERD (gastroesophageal reflux disease)   . Allergy   . Hepatitis C   . Hypertension   . Chronic kidney disease   . Migraines   . Urine incontinence   . Seizures ([place])   . Stroke [facility]) 2007    during brain surgery  . Pancreatitis, alcoholic 0000000  . Rectovaginal fistula   . H/O ETOH abuse     Sober since 2009  . H/O drug abuse     Clean since 2009  . Pancreatic ascites   . Cirrhosis [facility])     Patient Active Problem List   Diagnosis Date Noted  . Orthostatic hypotension 05/15/2015  . ALC (alcoholic liver cirrhosis) ([place]) 12/23/2014  . Clinical depression 12/23/2014  . Anxiety 12/23/2014  . Diabetes mellitus, type 2 ([place]) 12/23/2014  . HTN (hypertension) 11/05/2014  . Liver cirrhosis ([place]) 07/19/2014  . 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 Chronic pancreatitis ([place]) 11/03/2012  . Seizure disorder ([place]) 11/03/2012  . Benign meningioma of brain ([place]) 11/03/2012  . Screening for breast cancer 11/03/2012  . Chronic pelvic pain in female 11/03/2012  . Hepatitis C virus infection without hepatic coma 11/03/2012  . UTI (urinary tract infection) 11/03/2012  . Alcohol abuse,  unspecified 11/03/2012  . Chronic liver disease 11/03/2012  . Schizophrenia ([place]) 11/03/2012  . Narcotic abuse 11/03/2012  . 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 Breast screening 11/03/2012  . 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 Nondependent alcohol abuse 11/03/2012  . 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 Female genital symptoms 11/03/2012  . Infection of urinary tract 11/03/2012    Past Surgical History  Procedure Laterality Date  . Appendectomy  1999  . Eye surgery    . Rectovaginal fistula repair w/ colostomy  2011  . Brain surgery  2007    malignant brain tumor  . Cholecystectomy  2001  . Abdominal hysterectomy  1979    Current Outpatient Rx  Name  Route  Sig  Dispense  Refill  . ADVAIR DISKUS 500-50 MCG/DOSE AEPB      inhale 1 dose by mouth twice a day      0     Dispense as written.   Marland Kitchen albuterol (PROAIR HFA) 108 (90 BASE) MCG/ACT inhaler               . ALPRAZolam (XANAX) 1 MG tablet   Oral   Take 1 tablet (1 mg total) by mouth 2 (two) times daily.   120 tablet   0   . amitriptyline (ELAVIL) 50 MG tablet   Oral   Take 1 tablet (50 mg  total) by mouth at bedtime.   30 tablet   3   . Bismuth Subsalicylate (KAOPECTATE EXTRA STRENGTH) 525 MG/15ML SUSP   Oral   Take 15 mLs (525 mg total) by mouth every 2 (two) hours as needed.   420 mL   0   . CREON 12000 UNITS CPEP   Oral   Take 24,000 Units by mouth 3 (three) times daily before meals.          . furosemide (LASIX) 20 MG tablet   Oral   Take 20 mg by mouth daily.         Marland Kitchen gabapentin (NEURONTIN) 300 MG capsule   Oral   Take 300 mg by mouth 3 (three) times daily.          . hydrocortisone cream 0.5 %   Topical   Apply 1 application topically 2 (two) times daily as needed for itching.   30 g   0   . Ipratropium-Albuterol (COMBIVENT) 20-100 MCG/ACT AERS respimat   Inhalation   Inhale into the lungs.          . lactulose (CHRONULAC) 10 GM/15ML solution   Oral   Take 20 g by mouth daily as needed for mild constipation.          . levETIRAcetam (KEPPRA) 500 MG tablet   Oral   Take 1 tablet (500 mg total) by mouth 2 (two) times daily.   60 tablet   3   . meloxicam (MOBIC) 15 MG tablet   Oral   Take 1 tablet (15 mg total) by mouth 2 (two) times daily as needed for pain.   60 tablet   3   . metoprolol succinate (TOPROL-XL) 25 MG 24 hr tablet   Oral   Take 1 tablet (25 mg total) by mouth daily.   30 tablet   4   . nitrofurantoin, macrocrystal-monohydrate, (MACROBID) 100 MG capsule   Oral   Take 1 capsule (100 mg total) by mouth 2 (two) times daily.   20 capsule   0   . omeprazole (PRILOSEC) 20 MG capsule   Oral   Take 1 capsule (20 mg total) by mouth daily.   30 capsule   11     Please stop all of the other PPIs if on profile   . Oxycodone HCl 20 MG TABS   Oral   Take 1 tablet by mouth every 8 (eight) hours as needed.      0   . PARoxetine (PAXIL) 20 MG tablet   Oral   Take 20 mg by mouth.         . pravastatin (PRAVACHOL) 10 MG tablet   Oral   Take 10 mg by mouth daily.          . predniSONE (DELTASONE) 5 MG tablet   Oral   Take 5 mg by mouth daily with breakfast.          . promethazine (PHENERGAN) 25 MG tablet   Oral   Take 25 mg by mouth.         . propranolol (INDERAL) 10 MG tablet   Oral   Take 10 mg by mouth 2 (two) times daily.         . sertraline (ZOLOFT) 50 MG tablet   Oral   Take 50 mg by mouth daily.         Marland Kitchen tiotropium (SPIRIVA) 18 MCG inhalation capsule   Inhalation   Place 18 mcg into inhaler  and inhale 2 (two) times daily as needed (shortness of breath).          . triamcinolone (NASACORT ALLERGY 24HR) 55 MCG/ACT AERO nasal inhaler   Nasal   Place 2 sprays into the nose daily as needed (allergies).         . zolpidem (AMBIEN) 10 MG tablet   Oral   Take 1 tablet (10 mg total) by mouth at bedtime.   30 tablet   3      Allergies Ciprofloxacin; Dilaudid; Hydromorphone; Ibuprofen; Sulfa antibiotics; Zofran; Zofran; Amoxicillin; Chocolate; Fentanyl; Metformin; Penicillins; Strawberry (diagnostic); and Strawberry extract  Family History  Problem Relation Age of Onset  . Hypertension Mother   . Heart disease Father   . Diabetes Father   . Cancer Sister     brain  . Cancer Grandchild 8    brain tumor    Social History Social History  Substance Use Topics  . Smoking status: Current Some Day Smoker -- 0.25 packs/day    Types: Cigarettes  . Smokeless tobacco: Never Used     Comment: cutting back  . Alcohol Use: No     Comment: no alcohol since 2013    Review of Systems  Constitutional: Negative for fever. Eyes: Negative for visual changes. ENT: Negative for sore throat. Cardiovascular: Negative for chest pain. Respiratory: Negative for shortness of breath. Gastrointestinal: Positive for abdominal pain Genitourinary: Positive for dysuria. Musculoskeletal: Negative for back pain. Skin: Negative for rash. Neurological: Negative for headaches, focal weakness or numbness.   10-point ROS otherwise negative.  ____________________________________________   PHYSICAL EXAM:  VITAL SIGNS: ED Triage Vitals  Enc Vitals Group     BP 06/21/15 2324 92/70 mmHg     Pulse Rate 06/21/15 2324 110     Resp 06/21/15 2324 20     Temp 06/21/15 2324 98.4 F (36.9 C)     Temp Source 06/21/15 2324 Oral     SpO2 06/21/15 2324 95 %     Weight 06/21/15 2324 262 lb (118.842 kg)     Height 06/21/15 2324 5\' 8"  (1.727 m)     Head Cir --      Peak Flow --      Pain Score 06/21/15 2326 6     Pain Loc --      Pain Edu? --      Excl. in [place]? --      Constitutional: Alert and oriented. Well appearing and in no distress. Eyes: Conjunctivae are normal. PERRL. Normal extraocular movements. ENT   Head: Normocephalic and atraumatic.   Nose: No congestion/rhinnorhea.   Mouth/Throat: Mucous membranes  are moist.   Neck: No stridor. Hematological/Lymphatic/Immunilogical: No cervical lymphadenopathy. Cardiovascular: Normal rate, regular rhythm. Normal and symmetric distal pulses are present in all extremities. No murmurs, rubs, or gallops. Respiratory: Normal respiratory effort without tachypnea nor retractions. Breath sounds are clear and equal bilaterally. No wheezes/rales/rhonchi. Gastrointestinal: Diffuse abdominal pain with palpation. No distention. There is no CVA tenderness. Genitourinary: deferred Musculoskeletal: Nontender with normal range of motion in all extremities. No joint effusions.  No lower extremity tenderness nor edema. Neurologic:  Normal speech and language. No gross focal neurologic deficits are appreciated. Speech is normal.  Skin:  Skin is warm, dry and intact. No rash noted. Psychiatric: Mood and affect are normal. Speech and behavior are normal. Patient exhibits appropriate insight and judgment.  ____________________________________________    LABS (pertinent positives/negatives)  Labs Reviewed  CBC - Abnormal; Notable for the following:  Hemoglobin 9.9 (*)    HCT 31.8 (*)    MCV 77.4 (*)    MCH 24.1 (*)    MCHC 31.2 (*)    RDW 19.3 (*)    Platelets 122 (*)    All other components within normal limits  COMPREHENSIVE METABOLIC PANEL - Abnormal; Notable for the following:    Glucose, Bld 192 (*)    AST 69 (*)    Alkaline Phosphatase 180 (*)    All other components within normal limits  URINALYSIS COMPLETEWITH MICROSCOPIC ([facility] ONLY) - Abnormal; Notable for the following:    Color, Urine YELLOW (*)    APPearance CLEAR (*)    Nitrite POSITIVE (*)    Leukocytes, UA TRACE (*)    Bacteria, UA FEW (*)    Squamous Epithelial / LPF 0-5 (*)    All other components within normal limits  LACTIC ACID, PLASMA - Abnormal; Notable for the following:    Lactic Acid, Venous 3.7 (*)    All other components within normal limits  BASIC METABOLIC PANEL -  Abnormal; Notable for the following:    Glucose, Bld 170 (*)    Calcium 8.0 (*)    All other components within normal limits  CBC - Abnormal; Notable for the following:    RBC 3.39 (*)    Hemoglobin 8.0 (*)    HCT 26.0 (*)    MCV 76.9 (*)    MCH 23.5 (*)    MCHC 30.6 (*)    RDW 19.2 (*)    Platelets 103 (*)    All other components within normal limits  CULTURE, BLOOD (ROUTINE X 2)  CULTURE, BLOOD (ROUTINE X 2)  URINE CULTURE  C DIFFICILE QUICK SCREEN W PCR REFLEX  LACTIC ACID, PLASMA    Critical care:CRITICAL CARE Performed by: Marjean Donna N   Total critical care time: 60 minutes  Critical care time was exclusive of separately billable procedures and treating other patients.  Critical care was necessary to treat or prevent imminent or life-threatening deterioration.  Critical care was time spent personally by me on the following activities: development of treatment plan with patient and/or surrogate as well as nursing, discussions with consultants, evaluation of patient's response to treatment, examination of patient, obtaining history from patient or surrogate, ordering and performing treatments and interventions, ordering and review of laboratory studies, ordering and review of radiographic studies, pulse oximetry and re-evaluation of patient's condition.    INITIAL IMPRESSION / ASSESSMENT AND PLAN / ED COURSE  Pertinent labs & imaging results that were available during my care of the patient were reviewed by me and considered in my medical decision making (see chart for details).  Patient tachycardic on presentation heart rate 110 tachypnea, respiratory 22 afebrile temperature 98.4. Reviewed culture results from last available culture which revealed sensitive to Linezolid was administered. Patient received 2 L normal saline not hypotensive as such 41ml/kg was not administered ____________________________________________   FINAL CLINICAL IMPRESSION(S) / ED  DIAGNOSES  Final diagnoses:  Sepsis, due to unspecified organism [facility])      Gregor Hams, MD 06/23/15 [phone]

## 2015-06-22 NOTE — ED Notes (Signed)
IV insertion attempted x1 without success, pt refused a second stick; requests another nurse.

## 2015-06-22 NOTE — ED Notes (Signed)
Patient tearful reporting that she is scared. Asking RN to pray with her; RN obliged request. Patient reporting how scared she is regarding admission and fearful that she is going to die. Chaplain paged for patient at this this.

## 2015-06-22 NOTE — Telephone Encounter (Signed)
Called pt, no answer. Unable to leave message as voicemail is disabled. RX sent in.

## 2015-06-22 NOTE — Progress Notes (Signed)
[facility] Urine culture 06/16/15 Escherichia coli  Ampicillin Intermediate  Ampicillin + Sulbactam Susceptible Cefazolin Intermediate Comment: Testing results predict SUSCEPTIBILITY for the oral agents cefnidir, cefuroxime, and cephalexin when used for therapy of uncomplicated UTI's due to this organism.  Ceftriaxone Susceptible Ciprofloxacin Intermediate  Gentamicin Susceptible Levofloxacin Intermediate  Nitrofurantoin Resistant  Tobramycin Susceptible  Trimethoprim + Sulfamethoxazole Susceptible   Klebsiella pneumoniae  Ampicillin Resistant  Ampicillin + Sulbactam Susceptible  Cefazolin Intermediate Comment: Testing results predict SUSCEPTIBILITY for the oral agents cefnidir, cefuroxime, and cephalexin when used for therapy of uncomplicated UTI's due to this organism.  Ceftriaxone Susceptible  Ciprofloxacin Intermediate  Gentamicin Susceptible  Levofloxacin Intermediate  Nitrofurantoin Resistant  Tobramycin Susceptible  Trimethoprim + Sulfamethoxazole Susceptible

## 2015-06-22 NOTE — H&P (Signed)
[place] [facility] [place] NAME: [name]    MR#:  [id number]  DATE OF BIRTH:  [birth year]   DATE OF ADMISSION:  06/22/2015  PRIMARY CARE PHYSICIAN: Rubbie Battiest, NP   REQUESTING/REFERRING PHYSICIAN: Owens Shark  CHIEF COMPLAINT:   Chief Complaint  Patient presents with  . Back Pain  . Urinary Tract Infection    HISTORY OF PRESENT ILLNESS:  [name]  is a 59 y.o. female with a known history of essential hypertension, seizure disorder is presenting with dysuria. States that she has recently at [facility] diagnosed with urinary tract infection however did not take antibiotics as prescribed. She is now presenting with dysuria, fevers, chills as well as lower back pain described only as "pain" 6/10 intensity nonradiating and no worsening or relieving factors. In the emergency department she was noted to be tachycardic she is given a dose of Zyvox based off of prior urinary cultures. However urine culture data is now available from [facility] as dictated in my prior note.  PAST MEDICAL HISTORY:   Past Medical History  Diagnosis Date  . Emphysema of lung ([place])   . Depression   . Diverticulitis   . Chronic bronchitis ([place])   . Malignant brain tumor ([place]) 2007  . GERD (gastroesophageal reflux disease)   . Allergy   . Hepatitis C   . Hypertension   . Chronic kidney disease   . Migraines   . Urine incontinence   . Seizures ([place])   . Stroke [facility]) 2007    during brain surgery  . Pancreatitis, alcoholic 0000000  . Rectovaginal fistula   . H/O ETOH abuse     Sober since 2009  . H/O drug abuse     Clean since 2009  . Pancreatic ascites   . Cirrhosis ([place])     PAST SURGICAL HISTORY:   Past Surgical History  Procedure Laterality Date  . Appendectomy  1999  . Eye surgery    . Rectovaginal fistula repair w/ colostomy  2011  . Brain surgery  2007    malignant brain tumor  . Cholecystectomy  2001  . Abdominal hysterectomy  1979    SOCIAL  HISTORY:   Social History  Substance Use Topics  . Smoking status: Current Some Day Smoker -- 0.25 packs/day    Types: Cigarettes  . Smokeless tobacco: Never Used     Comment: cutting back  . Alcohol Use: No     Comment: no alcohol since 2013    FAMILY HISTORY:   Family History  Problem Relation Age of Onset  . Hypertension Mother   . Heart disease Father   . Diabetes Father   . Cancer Sister     brain  . Cancer Grandchild 8    brain tumor    DRUG ALLERGIES:   Allergies  Allergen Reactions  . Ciprofloxacin Itching  . Dilaudid [Hydromorphone Hcl] Itching  . Hydromorphone Itching  . Ibuprofen Other (See Comments)    Other reaction(s): Other (See Comments) Cannot take because of liver disease Cannot take because of liver disease   . Sulfa Antibiotics Hives  . Zofran [Ondansetron Hcl] Itching    Other reaction(s): Other (See Comments) Do not use; vomiting per pt  . Zofran [Ondansetron] Other (See Comments)    Do not use; vomiting per pt  . Amoxicillin Rash  . Chocolate Rash  . Fentanyl Rash  . Metformin Nausea Only  . Penicillins Rash and Other (See Comments)  Has patient had a PCN reaction causing immediate rash, facial/tongue/throat swelling, SOB or lightheadedness with hypotension: [id number] Has patient had a PCN reaction causing severe rash involving mucus membranes or skin necrosis: [id number] Has patient had a PCN reaction that required hospitalization [id number] Has patient had a PCN reaction occurring within the last 10 years: [id number] If all of the above answers are "NO", then may proceed with Cephalosporin use.   . Strawberry (Diagnostic) Rash  . Strawberry Extract Rash    REVIEW OF SYSTEMS:  REVIEW OF SYSTEMS:  CONSTITUTIONAL: Positive fevers, chills, fatigue, weakness.  EYES: Denies blurred vision, double vision, or eye pain.  EARS, NOSE, THROAT: Denies tinnitus, ear pain, hearing loss.  RESPIRATORY: Positive cough, denies  shortness of breath, wheezing  CARDIOVASCULAR: Denies chest pain, palpitations, edema.  GASTROINTESTINAL: Denies nausea, vomiting, diarrhea, abdominal pain.  GENITOURINARY: Positive dysuria, denies hematuria.  ENDOCRINE: Denies nocturia or thyroid problems. HEMATOLOGIC AND LYMPHATIC: Denies easy bruising or bleeding.  SKIN: Denies rash or lesions.  MUSCULOSKELETAL: Denies pain in neck, shoulder, knees, hips, or further arthritic symptoms. Positive back pain NEUROLOGIC: Denies paralysis, paresthesias.  PSYCHIATRIC: Denies anxiety or depressive symptoms. Otherwise full review of systems performed by me is negative.   MEDICATIONS AT HOME:   Prior to Admission medications   Medication Sig Start Date End Date Taking? Authorizing Provider  ADVAIR DISKUS 500-50 MCG/DOSE AEPB inhale 1 dose by mouth twice a day 12/07/14  Yes Historical Provider, MD  albuterol (PROAIR HFA) 108 (90 BASE) MCG/ACT inhaler Inhale 2 puffs into the lungs every 4 (four) hours as needed.  11/30/14  Yes Historical Provider, MD  CREON 12000 UNITS CPEP Take 24,000 Units by mouth 3 (three) times daily before meals.  08/11/12  Yes Historical Provider, MD  furosemide (LASIX) 20 MG tablet Take 20 mg by mouth daily.   Yes Historical Provider, MD  gabapentin (NEURONTIN) 300 MG capsule Take 300 mg by mouth 3 (three) times daily.    Yes Historical Provider, MD  Ipratropium-Albuterol (COMBIVENT) 20-100 MCG/ACT AERS respimat Inhale into the lungs.   Yes Historical Provider, MD  lactulose (CHRONULAC) 10 GM/15ML solution Take 20 g by mouth daily as needed for mild constipation.  09/07/12  Yes Historical Provider, MD  PARoxetine (PAXIL) 20 MG tablet Take 20 mg by mouth.   Yes Historical Provider, MD  pravastatin (PRAVACHOL) 10 MG tablet Take 10 mg by mouth daily.  12/07/14  Yes Historical Provider, MD  ALPRAZolam Duanne Moron) 1 MG tablet Take 1 tablet (1 mg total) by mouth 2 (two) times daily. 01/11/15   Rubie Maid, MD  amitriptyline (ELAVIL) 50 MG  tablet Take 1 tablet (50 mg total) by mouth at bedtime. 12/01/12   Jackolyn Confer, MD  Bismuth Subsalicylate (KAOPECTATE EXTRA STRENGTH) 525 MG/15ML SUSP Take 15 mLs (525 mg total) by mouth every 2 (two) hours as needed. 06/16/15   Rubie Maid, MD  hydrocortisone cream 0.5 % Apply 1 application topically 2 (two) times daily as needed for itching. 06/16/15   Rubie Maid, MD  levETIRAcetam (KEPPRA) 500 MG tablet Take 1 tablet (500 mg total) by mouth 2 (two) times daily. 11/03/12   Jackolyn Confer, MD  meloxicam (MOBIC) 15 MG tablet Take 1 tablet (15 mg total) by mouth 2 (two) times daily as needed for pain. 06/16/15   Rubie Maid, MD  metoprolol succinate (TOPROL-XL) 25 MG 24 hr tablet Take 1 tablet (25 mg total) by mouth daily. 10/21/12   Raquel Dagoberto Ligas, NP  nitrofurantoin, macrocrystal-monohydrate, (MACROBID) 100  MG capsule Take 1 capsule (100 mg total) by mouth 2 (two) times daily. 05/21/15   Earleen Newport, MD  omeprazole (PRILOSEC) 20 MG capsule Take 1 capsule (20 mg total) by mouth daily. 07/26/14   [name], MD  Oxycodone HCl 20 MG TABS Take 1 tablet by mouth every 8 (eight) hours as needed. 04/09/15   Historical Provider, MD  predniSONE (DELTASONE) 5 MG tablet Take 5 mg by mouth daily with breakfast.  01/16/15   Historical Provider, MD  promethazine (PHENERGAN) 25 MG tablet Take 25 mg by mouth.    Historical Provider, MD  propranolol (INDERAL) 10 MG tablet Take 10 mg by mouth 2 (two) times daily. 05/05/15   Historical Provider, MD  sertraline (ZOLOFT) 50 MG tablet Take 50 mg by mouth daily.    Historical Provider, MD  tiotropium (SPIRIVA) 18 MCG inhalation capsule Place 18 mcg into inhaler and inhale 2 (two) times daily as needed (shortness of breath).     Historical Provider, MD  triamcinolone (NASACORT ALLERGY 24HR) 55 MCG/ACT AERO nasal inhaler Place 2 sprays into the nose daily as needed (allergies).    Historical Provider, MD  zolpidem (AMBIEN) 10 MG tablet Take 1 tablet (10 mg  total) by mouth at bedtime. 10/21/12   Raquel Dagoberto Ligas, NP      VITAL SIGNS:  Blood pressure 130/88, pulse 96, temperature 97.8 F (36.6 C), temperature source Oral, resp. rate 18, height 5\' 8"  (1.727 m), weight 262 lb (118.842 kg), SpO2 96 %.  PHYSICAL EXAMINATION:  VITAL SIGNS: Filed Vitals:   06/22/15 0100 06/22/15 0213  BP: 103/70 130/88  Pulse: 107 96  Temp:  97.8 F (36.6 C)  Resp: 42 18   GENERAL:59 y.o.female currently in no acute distress.  HEAD: Normocephalic, atraumatic.  EYES: Pupils equal, round, reactive to light. Extraocular muscles intact. No scleral icterus.  MOUTH: Moist mucosal membrane. Dentition intact. No abscess noted.  EAR, NOSE, THROAT: Clear without exudates. No external lesions.  NECK: Supple. No thyromegaly. No nodules. No JVD.  PULMONARY: Diminished breath sounds secondary to poor respiratory effort  without wheeze rails or rhonci. No use of accessory muscles, Good respiratory effort. good air entry bilaterally CHEST: Nontender to palpation.  CARDIOVASCULAR: S1 and S2. Regular rate and rhythm. No murmurs, rubs, or gallops. No edema. Pedal pulses 2+ bilaterally.  GASTROINTESTINAL: Soft, nontender, nondistended. No masses. Positive bowel sounds. No hepatosplenomegaly.  MUSCULOSKELETAL: No swelling, clubbing, or edema. Range of motion full in all extremities.  NEUROLOGIC: Cranial nerves II through XII are intact. No gross focal neurological deficits. Sensation intact. Reflexes intact.  SKIN: No ulceration, lesions, rashes, or cyanosis. Skin warm and dry. Turgor intact.  PSYCHIATRIC: Mood, affect within normal limits. The patient is awake, alert and oriented x 3. Insight, judgment intact.    LABORATORY PANEL:   CBC  Recent Labs Lab 06/21/15 2332  WBC 5.2  HGB 9.9*  HCT 31.8*  PLT 122*   ------------------------------------------------------------------------------------------------------------------  Chemistries   Recent Labs Lab 06/21/15 2332   NA 139  K 3.6  CL 109  CO2 23  GLUCOSE 192*  BUN 6  CREATININE 0.82  CALCIUM 8.9  AST 69*  ALT 35  ALKPHOS 180*  BILITOT 0.9   ------------------------------------------------------------------------------------------------------------------  Cardiac Enzymes No results for input(s): TROPONINI in the last 168 hours. ------------------------------------------------------------------------------------------------------------------  RADIOLOGY:  No results found.  EKG:   Orders placed or performed during the hospital encounter of 05/21/15  . ED EKG  . ED EKG  .  EKG    IMPRESSION AND PLAN:   [name] history of essential hypertension COPD unspecified who is presenting with dysuria. She is previously seen [facility] diagnosed urinary tract infection however did not take antibiotics as prescribed thus presented with worsening symptoms.  1. Sepsis, meeting septic criteria by heart rate, respiratory rate present on arrival. Source in a tract infection  Panculture. Broad-spectrum antibiotics including ceftriaxone and taper antibiotics when culture data returns. He has received a 30 mL/kg IV fluid bolus. Continue IV fluid hydration to keep mean arterial pressure greater than 65. Prior culture data ceftriaxone.. Reasonable choice she states she does gets a rash when receiving penicillin I've explained the less than 1% cross-reactivity and will attempt to use ceftriaxone however if she does become symptomatic with stop this medicine. 2. COPD not in acute exacerbation: Continue with Advair, Spiriva 3. Seizure disorder continue home medications 4. Venous embolism prophylactic: Heparin subcutaneous    All the records are reviewed and case discussed with ED provider. Management plans discussed with the patient, family and they are in agreement.  CODE STATUS: Full  TOTAL TIME TAKING CARE OF THIS PATIENT: 35 minutes.    Holland Nickson,  Karenann Cai.D on 06/22/2015 at 2:29  AM  Between 7am to 6pm - Pager - [phone]  After 6pm: House Pager: - [phone]  [facility]  Office  [phone]  CC: Primary care physician; Rubbie Battiest, NP

## 2015-06-22 NOTE — Progress Notes (Signed)
Gave patient compassionate presence, spiritual support and Prayer Chaplain Ruben Im Ext [phone]

## 2015-06-23 ENCOUNTER — Telehealth: Payer: Self-pay

## 2015-06-23 DIAGNOSIS — F1010 Alcohol abuse, uncomplicated: Secondary | ICD-10-CM | POA: Diagnosis not present

## 2015-06-23 DIAGNOSIS — J439 Emphysema, unspecified: Secondary | ICD-10-CM | POA: Diagnosis not present

## 2015-06-23 DIAGNOSIS — Z9119 Patient's noncompliance with other medical treatment and regimen: Secondary | ICD-10-CM | POA: Diagnosis not present

## 2015-06-23 DIAGNOSIS — K922 Gastrointestinal hemorrhage, unspecified: Secondary | ICD-10-CM | POA: Diagnosis not present

## 2015-06-23 DIAGNOSIS — C7A010 Malignant carcinoid tumor of the duodenum: Secondary | ICD-10-CM | POA: Diagnosis not present

## 2015-06-23 DIAGNOSIS — E1122 Type 2 diabetes mellitus with diabetic chronic kidney disease: Secondary | ICD-10-CM | POA: Diagnosis not present

## 2015-06-23 DIAGNOSIS — N189 Chronic kidney disease, unspecified: Secondary | ICD-10-CM | POA: Diagnosis not present

## 2015-06-23 DIAGNOSIS — L02211 Cutaneous abscess of abdominal wall: Secondary | ICD-10-CM | POA: Diagnosis not present

## 2015-06-23 DIAGNOSIS — N390 Urinary tract infection, site not specified: Secondary | ICD-10-CM | POA: Diagnosis not present

## 2015-06-23 DIAGNOSIS — K529 Noninfective gastroenteritis and colitis, unspecified: Secondary | ICD-10-CM | POA: Diagnosis not present

## 2015-06-23 DIAGNOSIS — F419 Anxiety disorder, unspecified: Secondary | ICD-10-CM | POA: Diagnosis not present

## 2015-06-23 DIAGNOSIS — R319 Hematuria, unspecified: Secondary | ICD-10-CM | POA: Diagnosis not present

## 2015-06-23 DIAGNOSIS — Z66 Do not resuscitate: Secondary | ICD-10-CM | POA: Diagnosis not present

## 2015-06-23 DIAGNOSIS — Z780 Asymptomatic menopausal state: Secondary | ICD-10-CM | POA: Diagnosis not present

## 2015-06-23 DIAGNOSIS — J9811 Atelectasis: Secondary | ICD-10-CM | POA: Diagnosis not present

## 2015-06-23 DIAGNOSIS — J811 Chronic pulmonary edema: Secondary | ICD-10-CM | POA: Diagnosis not present

## 2015-06-23 DIAGNOSIS — R05 Cough: Secondary | ICD-10-CM | POA: Diagnosis not present

## 2015-06-23 DIAGNOSIS — Z933 Colostomy status: Secondary | ICD-10-CM | POA: Diagnosis not present

## 2015-06-23 DIAGNOSIS — K7030 Alcoholic cirrhosis of liver without ascites: Secondary | ICD-10-CM | POA: Diagnosis not present

## 2015-06-23 DIAGNOSIS — J90 Pleural effusion, not elsewhere classified: Secondary | ICD-10-CM | POA: Diagnosis not present

## 2015-06-23 DIAGNOSIS — B9689 Other specified bacterial agents as the cause of diseases classified elsewhere: Secondary | ICD-10-CM

## 2015-06-23 DIAGNOSIS — E278 Other specified disorders of adrenal gland: Secondary | ICD-10-CM | POA: Diagnosis not present

## 2015-06-23 DIAGNOSIS — K625 Hemorrhage of anus and rectum: Secondary | ICD-10-CM | POA: Diagnosis not present

## 2015-06-23 DIAGNOSIS — M810 Age-related osteoporosis without current pathological fracture: Secondary | ICD-10-CM | POA: Diagnosis not present

## 2015-06-23 DIAGNOSIS — D62 Acute posthemorrhagic anemia: Secondary | ICD-10-CM | POA: Diagnosis not present

## 2015-06-23 DIAGNOSIS — Z6841 Body Mass Index (BMI) 40.0 and over, adult: Secondary | ICD-10-CM | POA: Diagnosis not present

## 2015-06-23 DIAGNOSIS — K921 Melena: Secondary | ICD-10-CM | POA: Diagnosis not present

## 2015-06-23 DIAGNOSIS — N898 Other specified noninflammatory disorders of vagina: Secondary | ICD-10-CM | POA: Diagnosis not present

## 2015-06-23 DIAGNOSIS — N899 Noninflammatory disorder of vagina, unspecified: Secondary | ICD-10-CM | POA: Diagnosis not present

## 2015-06-23 DIAGNOSIS — K861 Other chronic pancreatitis: Secondary | ICD-10-CM | POA: Diagnosis not present

## 2015-06-23 DIAGNOSIS — F329 Major depressive disorder, single episode, unspecified: Secondary | ICD-10-CM | POA: Diagnosis not present

## 2015-06-23 DIAGNOSIS — K219 Gastro-esophageal reflux disease without esophagitis: Secondary | ICD-10-CM | POA: Diagnosis not present

## 2015-06-23 DIAGNOSIS — K766 Portal hypertension: Secondary | ICD-10-CM | POA: Diagnosis not present

## 2015-06-23 DIAGNOSIS — I8500 Esophageal varices without bleeding: Secondary | ICD-10-CM | POA: Diagnosis not present

## 2015-06-23 DIAGNOSIS — B182 Chronic viral hepatitis C: Secondary | ICD-10-CM | POA: Diagnosis not present

## 2015-06-23 DIAGNOSIS — K7460 Unspecified cirrhosis of liver: Secondary | ICD-10-CM | POA: Diagnosis not present

## 2015-06-23 DIAGNOSIS — B1920 Unspecified viral hepatitis C without hepatic coma: Secondary | ICD-10-CM | POA: Diagnosis not present

## 2015-06-23 DIAGNOSIS — N760 Acute vaginitis: Principal | ICD-10-CM

## 2015-06-23 DIAGNOSIS — R161 Splenomegaly, not elsewhere classified: Secondary | ICD-10-CM | POA: Diagnosis not present

## 2015-06-23 DIAGNOSIS — R079 Chest pain, unspecified: Secondary | ICD-10-CM | POA: Diagnosis not present

## 2015-06-23 DIAGNOSIS — R918 Other nonspecific abnormal finding of lung field: Secondary | ICD-10-CM | POA: Diagnosis not present

## 2015-06-23 MED ORDER — METRONIDAZOLE 500 MG PO TABS: 500.0000 mg | ORAL_TABLET | Freq: Three times a day (TID) | ORAL | Status: DC

## 2015-06-23 NOTE — Telephone Encounter (Signed)
-----   Message from Rubie Maid, MD sent at 06/22/2015 11:28 AM EST ----- Please inform patient that her culture returned positive, needs to be treated with antibiotics, the species is susceptible to Flagyl, should take 1 tab q 8 hours x 7 days.  I think I previously sent you a message about the extended treatment for the yeast.

## 2015-06-23 NOTE — Telephone Encounter (Signed)
Called pt, no answer. Unable to leave message as voicemail is disabled. RX sent in for Flagyl. RX was previously sent in for Diflucan.

## 2015-06-24 DIAGNOSIS — K922 Gastrointestinal hemorrhage, unspecified: Secondary | ICD-10-CM | POA: Insufficient documentation

## 2015-06-24 DIAGNOSIS — N12 Tubulo-interstitial nephritis, not specified as acute or chronic: Secondary | ICD-10-CM | POA: Insufficient documentation

## 2015-06-25 LAB — URINE CULTURE

## 2015-06-26 ENCOUNTER — Telehealth: Payer: Self-pay

## 2015-06-26 NOTE — Telephone Encounter (Signed)
She was discharged from our practice in 2014. She cannot follow up here. She will have to establish elsewhere.

## 2015-06-26 NOTE — Telephone Encounter (Signed)
Transitional care Management call made to follow up with patient and she reported being currently readmitted to the hospital.  Will continue to follow at second discharge.

## 2015-06-27 ENCOUNTER — Other Ambulatory Visit: Payer: Self-pay | Admitting: *Deleted

## 2015-06-27 LAB — CULTURE, BLOOD (ROUTINE X 2)
CULTURE: NO GROWTH
Culture: NO GROWTH

## 2015-06-27 NOTE — Patient Outreach (Signed)
Attempted to contact pt after receiving referral today. Unable to contact pt on pt's given number, emergency contact called and pt's mother answered. RNCM asked to speak to pt and mother offered that pt was in the hospital in [place]. RNCM will attempt to contact pt when she is discharged from [facility].   Plan: [facility] will call pt when discharged as part of the transition of care program.  Merlene Morse Kierstan Auer RN, BSN  [facility] Care Management [phone]

## 2015-06-30 ENCOUNTER — Other Ambulatory Visit: Payer: Self-pay | Admitting: *Deleted

## 2015-06-30 DIAGNOSIS — J449 Chronic obstructive pulmonary disease, unspecified: Secondary | ICD-10-CM | POA: Diagnosis not present

## 2015-06-30 DIAGNOSIS — F419 Anxiety disorder, unspecified: Secondary | ICD-10-CM | POA: Diagnosis not present

## 2015-06-30 DIAGNOSIS — I951 Orthostatic hypotension: Secondary | ICD-10-CM | POA: Diagnosis not present

## 2015-06-30 DIAGNOSIS — F329 Major depressive disorder, single episode, unspecified: Secondary | ICD-10-CM | POA: Diagnosis not present

## 2015-06-30 DIAGNOSIS — K7460 Unspecified cirrhosis of liver: Secondary | ICD-10-CM | POA: Diagnosis not present

## 2015-06-30 DIAGNOSIS — F1010 Alcohol abuse, uncomplicated: Secondary | ICD-10-CM | POA: Diagnosis not present

## 2015-06-30 DIAGNOSIS — R32 Unspecified urinary incontinence: Secondary | ICD-10-CM | POA: Diagnosis not present

## 2015-06-30 DIAGNOSIS — I129 Hypertensive chronic kidney disease with stage 1 through stage 4 chronic kidney disease, or unspecified chronic kidney disease: Secondary | ICD-10-CM | POA: Diagnosis not present

## 2015-06-30 DIAGNOSIS — Z7984 Long term (current) use of oral hypoglycemic drugs: Secondary | ICD-10-CM | POA: Diagnosis not present

## 2015-06-30 DIAGNOSIS — N189 Chronic kidney disease, unspecified: Secondary | ICD-10-CM | POA: Diagnosis not present

## 2015-06-30 DIAGNOSIS — G9340 Encephalopathy, unspecified: Secondary | ICD-10-CM | POA: Diagnosis not present

## 2015-06-30 DIAGNOSIS — E1122 Type 2 diabetes mellitus with diabetic chronic kidney disease: Secondary | ICD-10-CM | POA: Diagnosis not present

## 2015-06-30 NOTE — Patient Outreach (Signed)
Unsuccessful outreach for [facility] outreach for transition of care. Unable to leave voicemail message.   Plan: RNCM will await return call  RNCM will attempt outreach again in 3 days.  Rutherford Limerick RN, BSN  [facility] Care Management [phone])

## 2015-07-03 DIAGNOSIS — N189 Chronic kidney disease, unspecified: Secondary | ICD-10-CM | POA: Diagnosis not present

## 2015-07-03 DIAGNOSIS — Z7984 Long term (current) use of oral hypoglycemic drugs: Secondary | ICD-10-CM | POA: Diagnosis not present

## 2015-07-03 DIAGNOSIS — I951 Orthostatic hypotension: Secondary | ICD-10-CM | POA: Diagnosis not present

## 2015-07-03 DIAGNOSIS — F419 Anxiety disorder, unspecified: Secondary | ICD-10-CM | POA: Diagnosis not present

## 2015-07-03 DIAGNOSIS — R32 Unspecified urinary incontinence: Secondary | ICD-10-CM | POA: Diagnosis not present

## 2015-07-03 DIAGNOSIS — F329 Major depressive disorder, single episode, unspecified: Secondary | ICD-10-CM | POA: Diagnosis not present

## 2015-07-03 DIAGNOSIS — E1122 Type 2 diabetes mellitus with diabetic chronic kidney disease: Secondary | ICD-10-CM | POA: Diagnosis not present

## 2015-07-03 DIAGNOSIS — F1010 Alcohol abuse, uncomplicated: Secondary | ICD-10-CM | POA: Diagnosis not present

## 2015-07-03 DIAGNOSIS — I129 Hypertensive chronic kidney disease with stage 1 through stage 4 chronic kidney disease, or unspecified chronic kidney disease: Secondary | ICD-10-CM | POA: Diagnosis not present

## 2015-07-03 DIAGNOSIS — G9340 Encephalopathy, unspecified: Secondary | ICD-10-CM | POA: Diagnosis not present

## 2015-07-03 DIAGNOSIS — J449 Chronic obstructive pulmonary disease, unspecified: Secondary | ICD-10-CM | POA: Diagnosis not present

## 2015-07-03 DIAGNOSIS — K7460 Unspecified cirrhosis of liver: Secondary | ICD-10-CM | POA: Diagnosis not present

## 2015-07-04 ENCOUNTER — Telehealth: Payer: Self-pay | Admitting: Obstetrics and Gynecology

## 2015-07-04 NOTE — Telephone Encounter (Signed)
HEYYYY [name] WANTS YOU TO CALL HER. SHE JUST GOT OUT OF [facility] ICU. SHE HAS SOME HEART BREAKING NEWS TO TELL YOU ABOUT HER HEALTH.

## 2015-07-05 DIAGNOSIS — J449 Chronic obstructive pulmonary disease, unspecified: Secondary | ICD-10-CM | POA: Diagnosis not present

## 2015-07-05 DIAGNOSIS — F329 Major depressive disorder, single episode, unspecified: Secondary | ICD-10-CM | POA: Diagnosis not present

## 2015-07-05 DIAGNOSIS — N189 Chronic kidney disease, unspecified: Secondary | ICD-10-CM | POA: Diagnosis not present

## 2015-07-05 DIAGNOSIS — F1010 Alcohol abuse, uncomplicated: Secondary | ICD-10-CM | POA: Diagnosis not present

## 2015-07-05 DIAGNOSIS — Z7984 Long term (current) use of oral hypoglycemic drugs: Secondary | ICD-10-CM | POA: Diagnosis not present

## 2015-07-05 DIAGNOSIS — F419 Anxiety disorder, unspecified: Secondary | ICD-10-CM | POA: Diagnosis not present

## 2015-07-05 DIAGNOSIS — I951 Orthostatic hypotension: Secondary | ICD-10-CM | POA: Diagnosis not present

## 2015-07-05 DIAGNOSIS — G9340 Encephalopathy, unspecified: Secondary | ICD-10-CM | POA: Diagnosis not present

## 2015-07-05 DIAGNOSIS — R32 Unspecified urinary incontinence: Secondary | ICD-10-CM | POA: Diagnosis not present

## 2015-07-05 DIAGNOSIS — K7460 Unspecified cirrhosis of liver: Secondary | ICD-10-CM | POA: Diagnosis not present

## 2015-07-05 DIAGNOSIS — I129 Hypertensive chronic kidney disease with stage 1 through stage 4 chronic kidney disease, or unspecified chronic kidney disease: Secondary | ICD-10-CM | POA: Diagnosis not present

## 2015-07-05 DIAGNOSIS — E1122 Type 2 diabetes mellitus with diabetic chronic kidney disease: Secondary | ICD-10-CM | POA: Diagnosis not present

## 2015-07-11 DIAGNOSIS — G894 Chronic pain syndrome: Secondary | ICD-10-CM | POA: Diagnosis not present

## 2015-07-11 DIAGNOSIS — F411 Generalized anxiety disorder: Secondary | ICD-10-CM | POA: Diagnosis not present

## 2015-07-11 DIAGNOSIS — N390 Urinary tract infection, site not specified: Secondary | ICD-10-CM | POA: Diagnosis not present

## 2015-07-11 DIAGNOSIS — K860 Alcohol-induced chronic pancreatitis: Secondary | ICD-10-CM | POA: Diagnosis not present

## 2015-07-11 DIAGNOSIS — R1084 Generalized abdominal pain: Secondary | ICD-10-CM | POA: Diagnosis not present

## 2015-07-11 DIAGNOSIS — F1021 Alcohol dependence, in remission: Secondary | ICD-10-CM | POA: Diagnosis not present

## 2015-07-11 DIAGNOSIS — R112 Nausea with vomiting, unspecified: Secondary | ICD-10-CM | POA: Diagnosis not present

## 2015-07-11 DIAGNOSIS — R300 Dysuria: Secondary | ICD-10-CM | POA: Diagnosis not present

## 2015-07-12 DIAGNOSIS — J449 Chronic obstructive pulmonary disease, unspecified: Secondary | ICD-10-CM | POA: Diagnosis not present

## 2015-07-14 ENCOUNTER — Other Ambulatory Visit: Payer: Self-pay | Admitting: *Deleted

## 2015-07-14 NOTE — Patient Outreach (Signed)
RNCM was successful in reaching this pt. [facility] services explained. Pt was not interested in participating in [facility] services at this time.Pt stating she is receiving treatment at [facility] and feels she has all she needs. Pt stated she is receiving oncology treatments in [place] and her primary care MD is Dr. Flonnie Overman in [place]. Pt did not know MD's firat name. RNCM noted this is possibly a conflict of what is in pt's chart and will let [facility] care management assistant know. Pt stated she was being followed by behavioral health related to suicide precautions and this service was calling her every 4 hours. Pt verbalized several issues she was dealing with including family struggles and health struggles. RNCM listened empathetically to pt and asked pt if she needed RNCM to call someone for her, to which pt refused. Pt given [facility] main number and requested pt to call if she would like to receive services in the future. Pt verbalized understanding.    Plan: RNCM will close this case.  Make [facility] aware this pt is not interested in [facility] services at this time Make [facility] aware this pt has a different primary care MD than who is listed on the chart, so case closure letter not sent until this issue resolved.   Rutherford Limerick RN, BSN  [facility] Care Management [phone])

## 2015-07-21 DIAGNOSIS — J449 Chronic obstructive pulmonary disease, unspecified: Secondary | ICD-10-CM | POA: Diagnosis not present

## 2015-07-25 DIAGNOSIS — F329 Major depressive disorder, single episode, unspecified: Secondary | ICD-10-CM | POA: Diagnosis not present

## 2015-07-25 NOTE — Telephone Encounter (Signed)
[name] wants you to call her so she can update you about her condition. 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 Severe kidney inf. The skin b/t her vagina and rectum is torn...Marland KitchenMarland KitchenMarland Kitchen Call  [phone]

## 2015-07-26 DIAGNOSIS — J449 Chronic obstructive pulmonary disease, unspecified: Secondary | ICD-10-CM | POA: Diagnosis not present

## 2015-07-28 DIAGNOSIS — Z79891 Long term (current) use of opiate analgesic: Secondary | ICD-10-CM | POA: Diagnosis not present

## 2015-07-28 DIAGNOSIS — R569 Unspecified convulsions: Secondary | ICD-10-CM | POA: Diagnosis not present

## 2015-07-28 DIAGNOSIS — Z882 Allergy status to sulfonamides status: Secondary | ICD-10-CM | POA: Diagnosis not present

## 2015-07-28 DIAGNOSIS — J441 Chronic obstructive pulmonary disease with (acute) exacerbation: Secondary | ICD-10-CM | POA: Diagnosis not present

## 2015-07-28 DIAGNOSIS — Z888 Allergy status to other drugs, medicaments and biological substances status: Secondary | ICD-10-CM | POA: Diagnosis not present

## 2015-07-28 DIAGNOSIS — K625 Hemorrhage of anus and rectum: Secondary | ICD-10-CM | POA: Diagnosis not present

## 2015-07-28 DIAGNOSIS — R0600 Dyspnea, unspecified: Secondary | ICD-10-CM | POA: Diagnosis not present

## 2015-07-28 DIAGNOSIS — Z9119 Patient's noncompliance with other medical treatment and regimen: Secondary | ICD-10-CM | POA: Diagnosis not present

## 2015-07-28 DIAGNOSIS — N390 Urinary tract infection, site not specified: Secondary | ICD-10-CM | POA: Diagnosis not present

## 2015-07-28 DIAGNOSIS — K219 Gastro-esophageal reflux disease without esophagitis: Secondary | ICD-10-CM | POA: Diagnosis not present

## 2015-07-28 DIAGNOSIS — Z885 Allergy status to narcotic agent status: Secondary | ICD-10-CM | POA: Diagnosis not present

## 2015-07-28 DIAGNOSIS — Z79899 Other long term (current) drug therapy: Secondary | ICD-10-CM | POA: Diagnosis not present

## 2015-07-28 DIAGNOSIS — Z8673 Personal history of transient ischemic attack (TIA), and cerebral infarction without residual deficits: Secondary | ICD-10-CM | POA: Diagnosis not present

## 2015-07-28 DIAGNOSIS — M810 Age-related osteoporosis without current pathological fracture: Secondary | ICD-10-CM | POA: Diagnosis not present

## 2015-07-28 DIAGNOSIS — J069 Acute upper respiratory infection, unspecified: Secondary | ICD-10-CM | POA: Diagnosis not present

## 2015-07-28 DIAGNOSIS — I129 Hypertensive chronic kidney disease with stage 1 through stage 4 chronic kidney disease, or unspecified chronic kidney disease: Secondary | ICD-10-CM | POA: Diagnosis not present

## 2015-07-28 DIAGNOSIS — Z7951 Long term (current) use of inhaled steroids: Secondary | ICD-10-CM | POA: Diagnosis not present

## 2015-07-28 DIAGNOSIS — K861 Other chronic pancreatitis: Secondary | ICD-10-CM | POA: Diagnosis not present

## 2015-07-28 DIAGNOSIS — R1084 Generalized abdominal pain: Secondary | ICD-10-CM | POA: Diagnosis not present

## 2015-07-28 DIAGNOSIS — Z880 Allergy status to penicillin: Secondary | ICD-10-CM | POA: Diagnosis not present

## 2015-07-28 DIAGNOSIS — N189 Chronic kidney disease, unspecified: Secondary | ICD-10-CM | POA: Diagnosis not present

## 2015-07-29 DIAGNOSIS — J441 Chronic obstructive pulmonary disease with (acute) exacerbation: Secondary | ICD-10-CM | POA: Diagnosis not present

## 2015-07-29 DIAGNOSIS — E119 Type 2 diabetes mellitus without complications: Secondary | ICD-10-CM | POA: Diagnosis not present

## 2015-07-29 DIAGNOSIS — I10 Essential (primary) hypertension: Secondary | ICD-10-CM | POA: Diagnosis not present

## 2015-07-30 DIAGNOSIS — Z720 Tobacco use: Secondary | ICD-10-CM | POA: Diagnosis not present

## 2015-07-30 DIAGNOSIS — J441 Chronic obstructive pulmonary disease with (acute) exacerbation: Secondary | ICD-10-CM | POA: Diagnosis not present

## 2015-07-31 ENCOUNTER — Telehealth: Payer: Self-pay | Admitting: Obstetrics and Gynecology

## 2015-07-31 NOTE — Telephone Encounter (Signed)
Patient called stating she needs to tell you something very important.Thanks

## 2015-08-01 NOTE — Telephone Encounter (Signed)
Please call [name]. She wants to know if you still are her Doctor and update yo on her health.

## 2015-08-01 NOTE — Telephone Encounter (Signed)
Dr.Cherry will call pt this afternoon.

## 2015-08-02 ENCOUNTER — Telehealth: Payer: Self-pay | Admitting: Obstetrics and Gynecology

## 2015-08-02 NOTE — Telephone Encounter (Signed)
[name] said her ER visit was a (No go) . She went home and now feels worse and may call an ambulance to come get her. She wants you to leave those admission orders in case she needs to be admittied.

## 2015-08-04 DIAGNOSIS — J449 Chronic obstructive pulmonary disease, unspecified: Secondary | ICD-10-CM | POA: Diagnosis not present

## 2015-08-07 IMAGING — CT CT HEAD WITHOUT CONTRAST
1 of 2 series · 13 of 30 positions shown, 17 images · non-contrast
Comparison: 06/21/2013.  MRI 07/31/2011.

CLINICAL DATA: Fever. Back pain. Weakness. Altered mental status.

EXAM:
CT HEAD WITHOUT CONTRAST
TECHNIQUE: Contiguous axial images were obtained from the base of the skull
through the vertex without intravenous contrast.

[Series 2: soft tissue · axial · 0.46mm/px · z∈[-133,-3]mm · 13 of 32 slices shown, 17 images]
[im 3/32  brain]
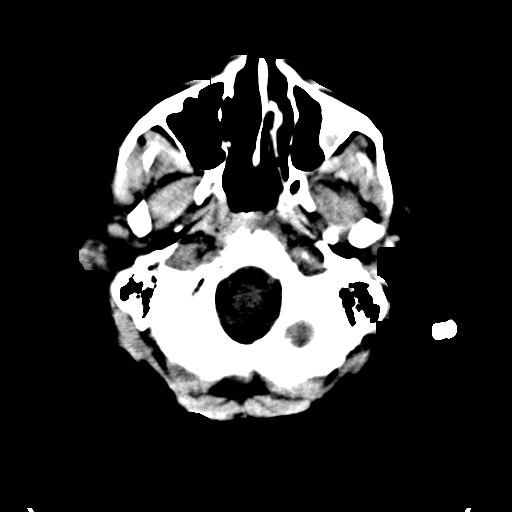
[im 3/32  bone]
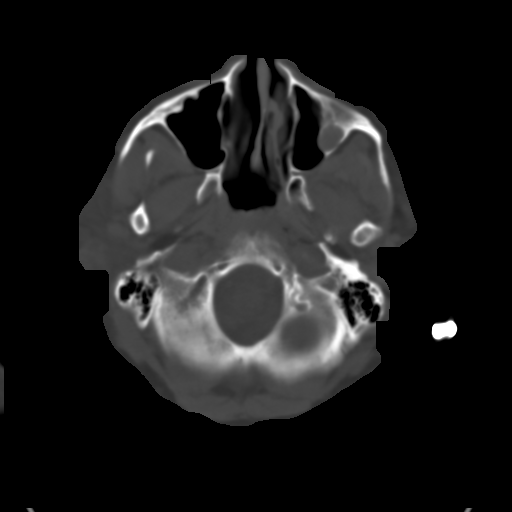
[im 5/32  brain]
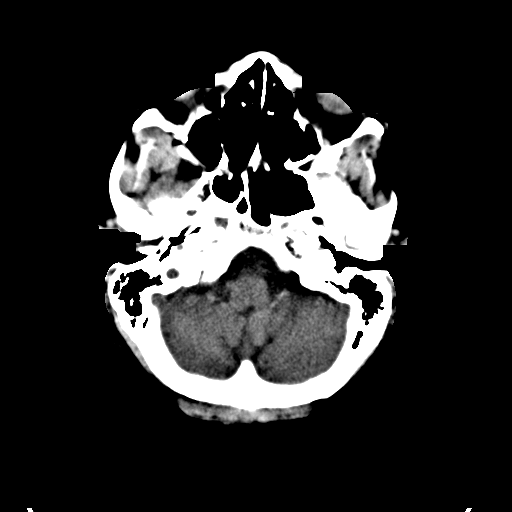
[im 7/32  brain]
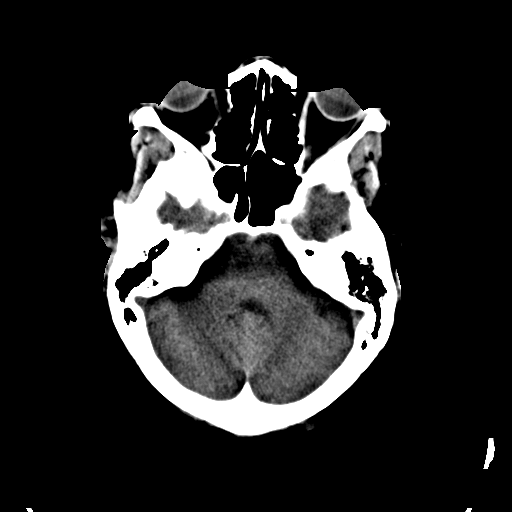
[im 9/32  brain]
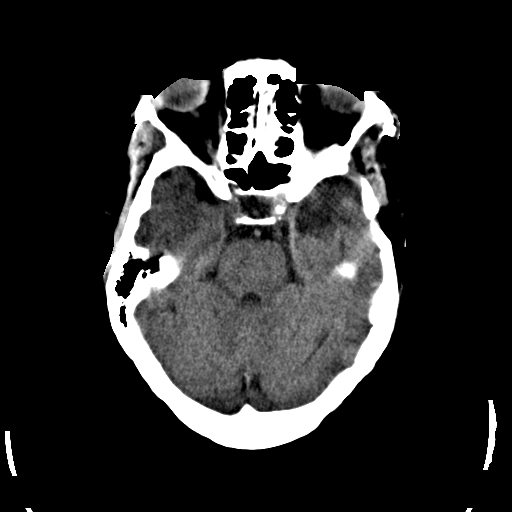
[im 12/32  brain]
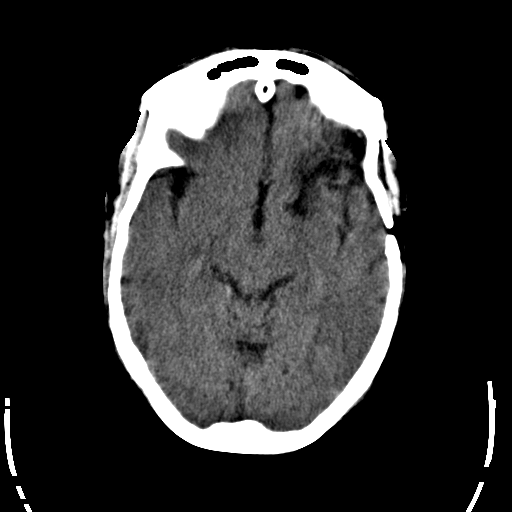
[im 12/32  bone]
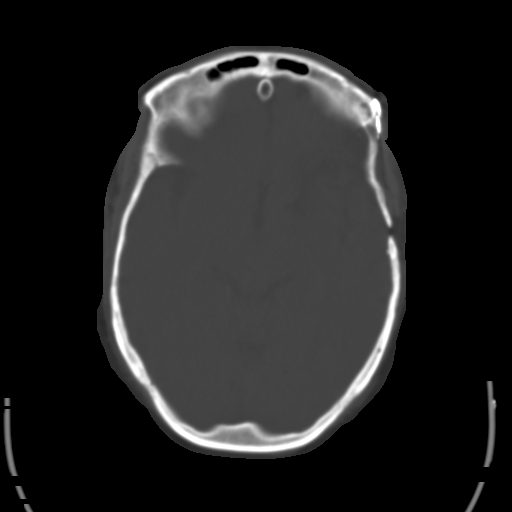
[im 14/32  brain]
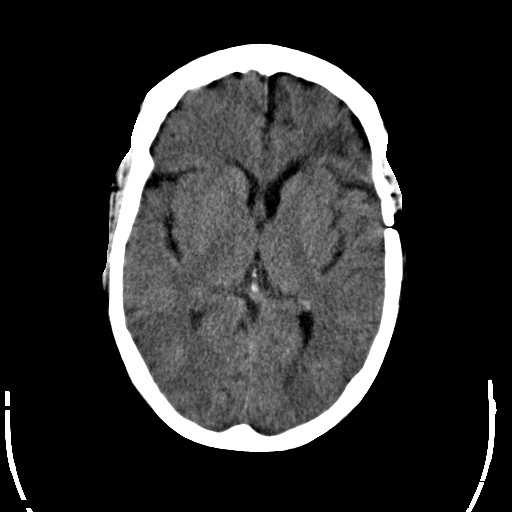
[im 16/32  brain]
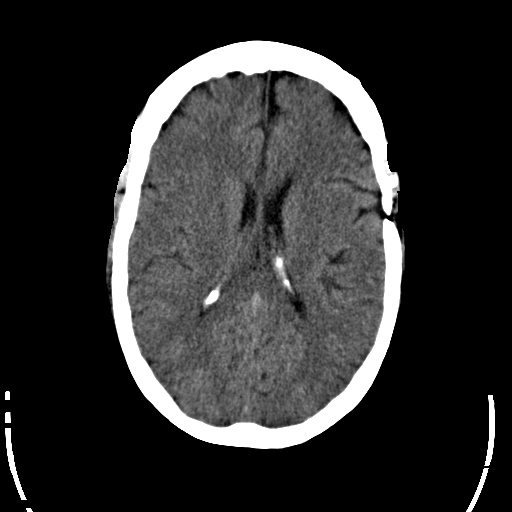
[im 18/32  brain]
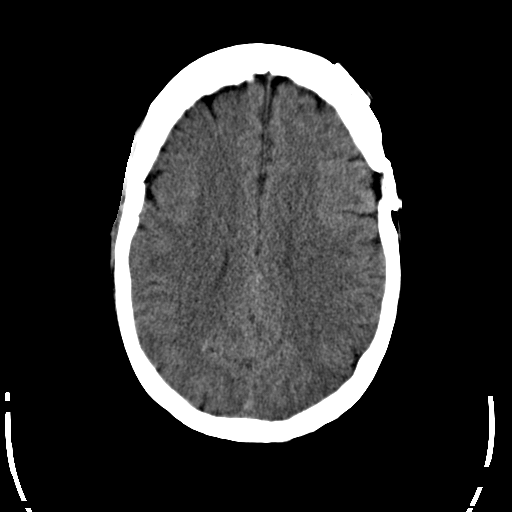
[im 20/32  brain]
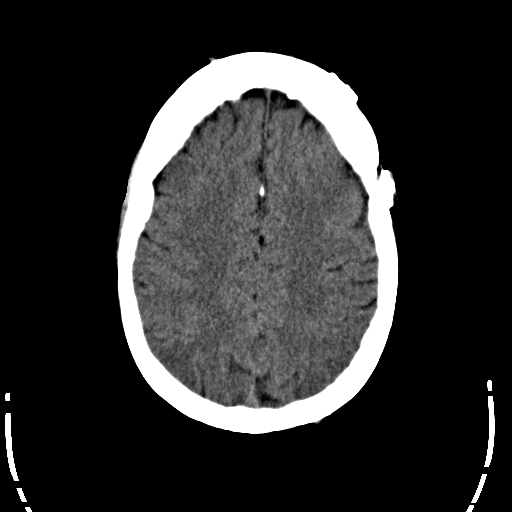
[im 20/32  bone]
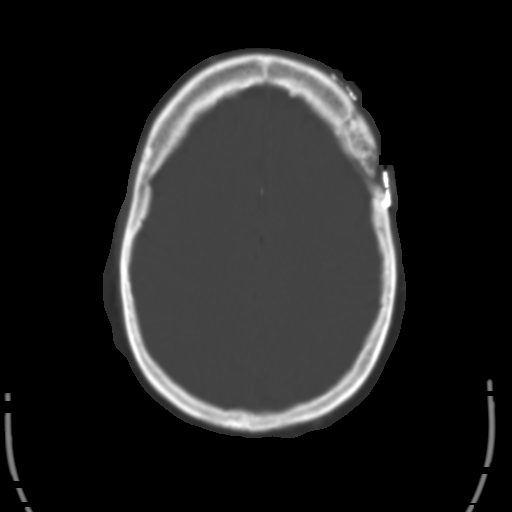
[im 23/32  brain]
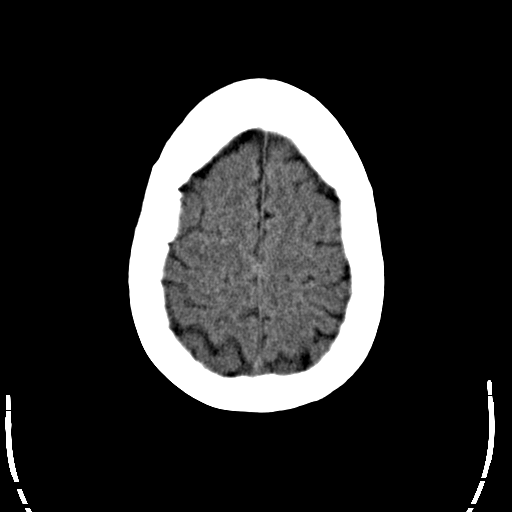
[im 25/32  brain]
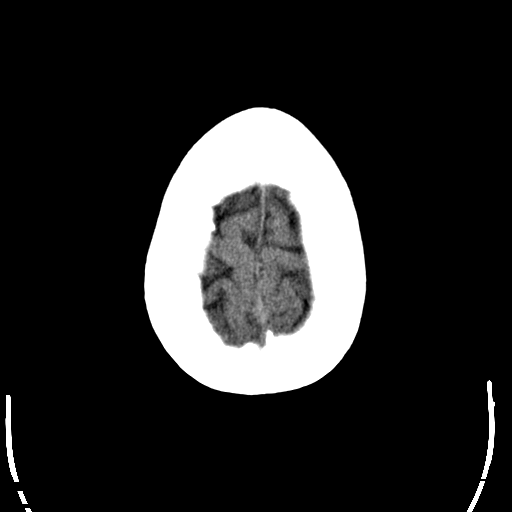
[im 27/32  brain]
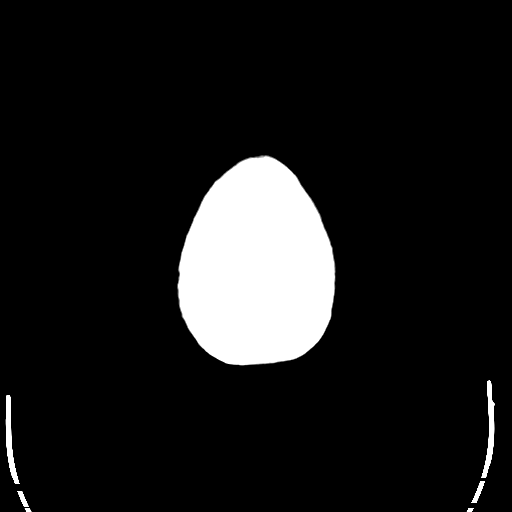
[im 29/32  brain]
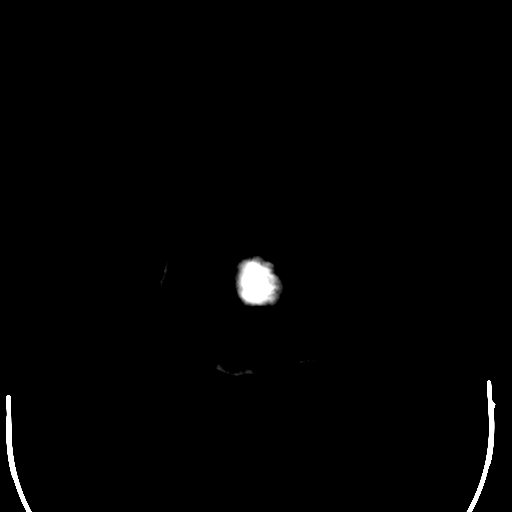
[im 29/32  bone]
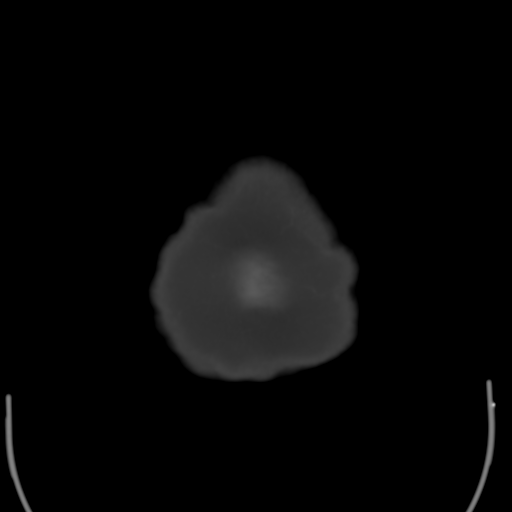

[13 of 30 positions shown; findings below may reference images not displayed]

FINDINGS: There has been previous left frontal craniotomy. There is atrophy in
the inferior frontal lobe and the temporal tip on the left. There is
no evidence of acute infarction, mass lesion, hemorrhage,
hydrocephalus or extra-axial collection. No acute calvarial finding.
Small retention cyst in the left maxillary sinus.
IMPRESSION: No acute finding. Previous craniotomy on the left. Chronic atrophy
of the left frontal and temporal lobes.

## 2015-08-07 IMAGING — CR DG CHEST 1V PORT
1 series · 2 of 2 positions shown · non-contrast
Comparison: Chest x-ray of 06/10/2013

CLINICAL DATA: Weakness, respiratory difficulty, increased
respiratory rate

EXAM:
PORTABLE CHEST - 1 VIEW

[Series 1: ap · 0.17mm/px · 2 of 2 slices shown]
[im 1/2]
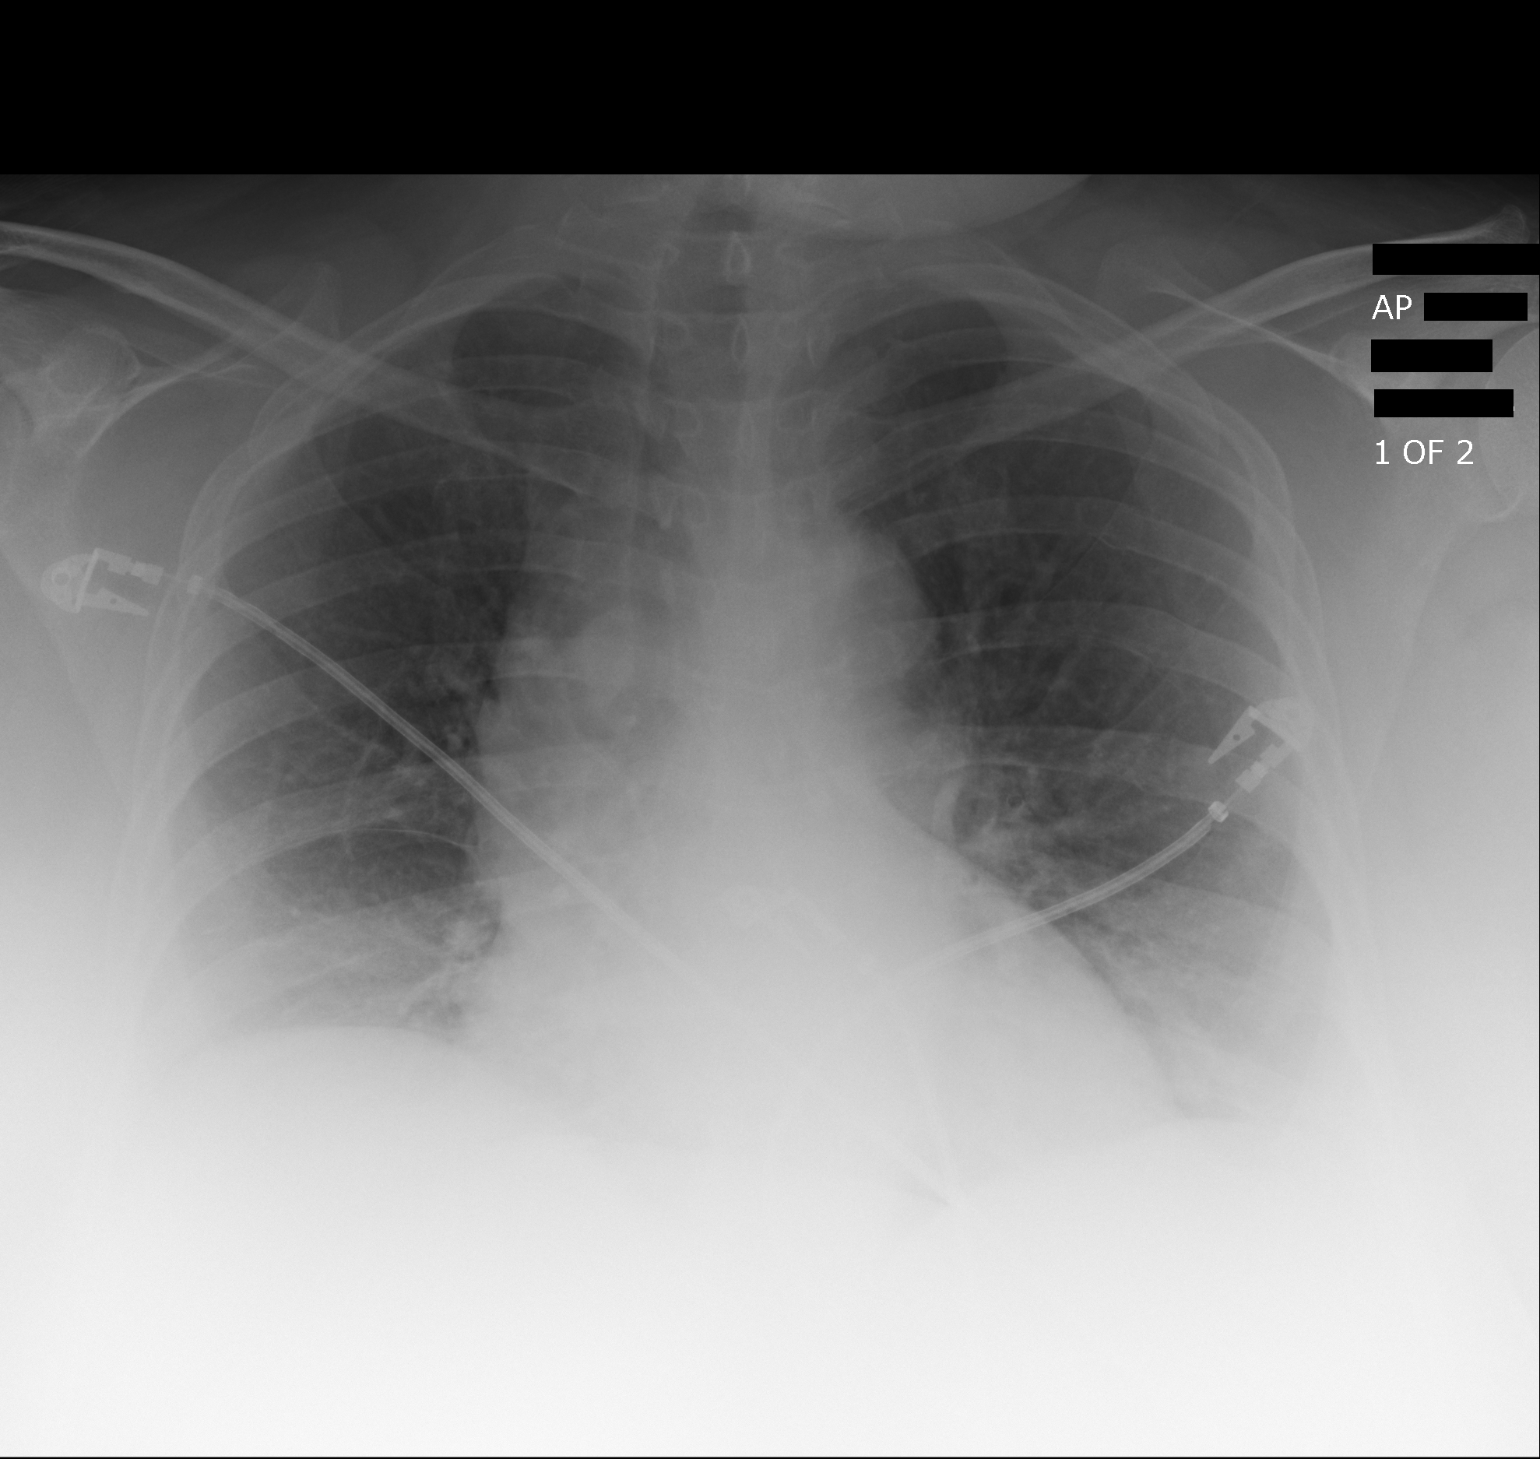
[im 2/2]
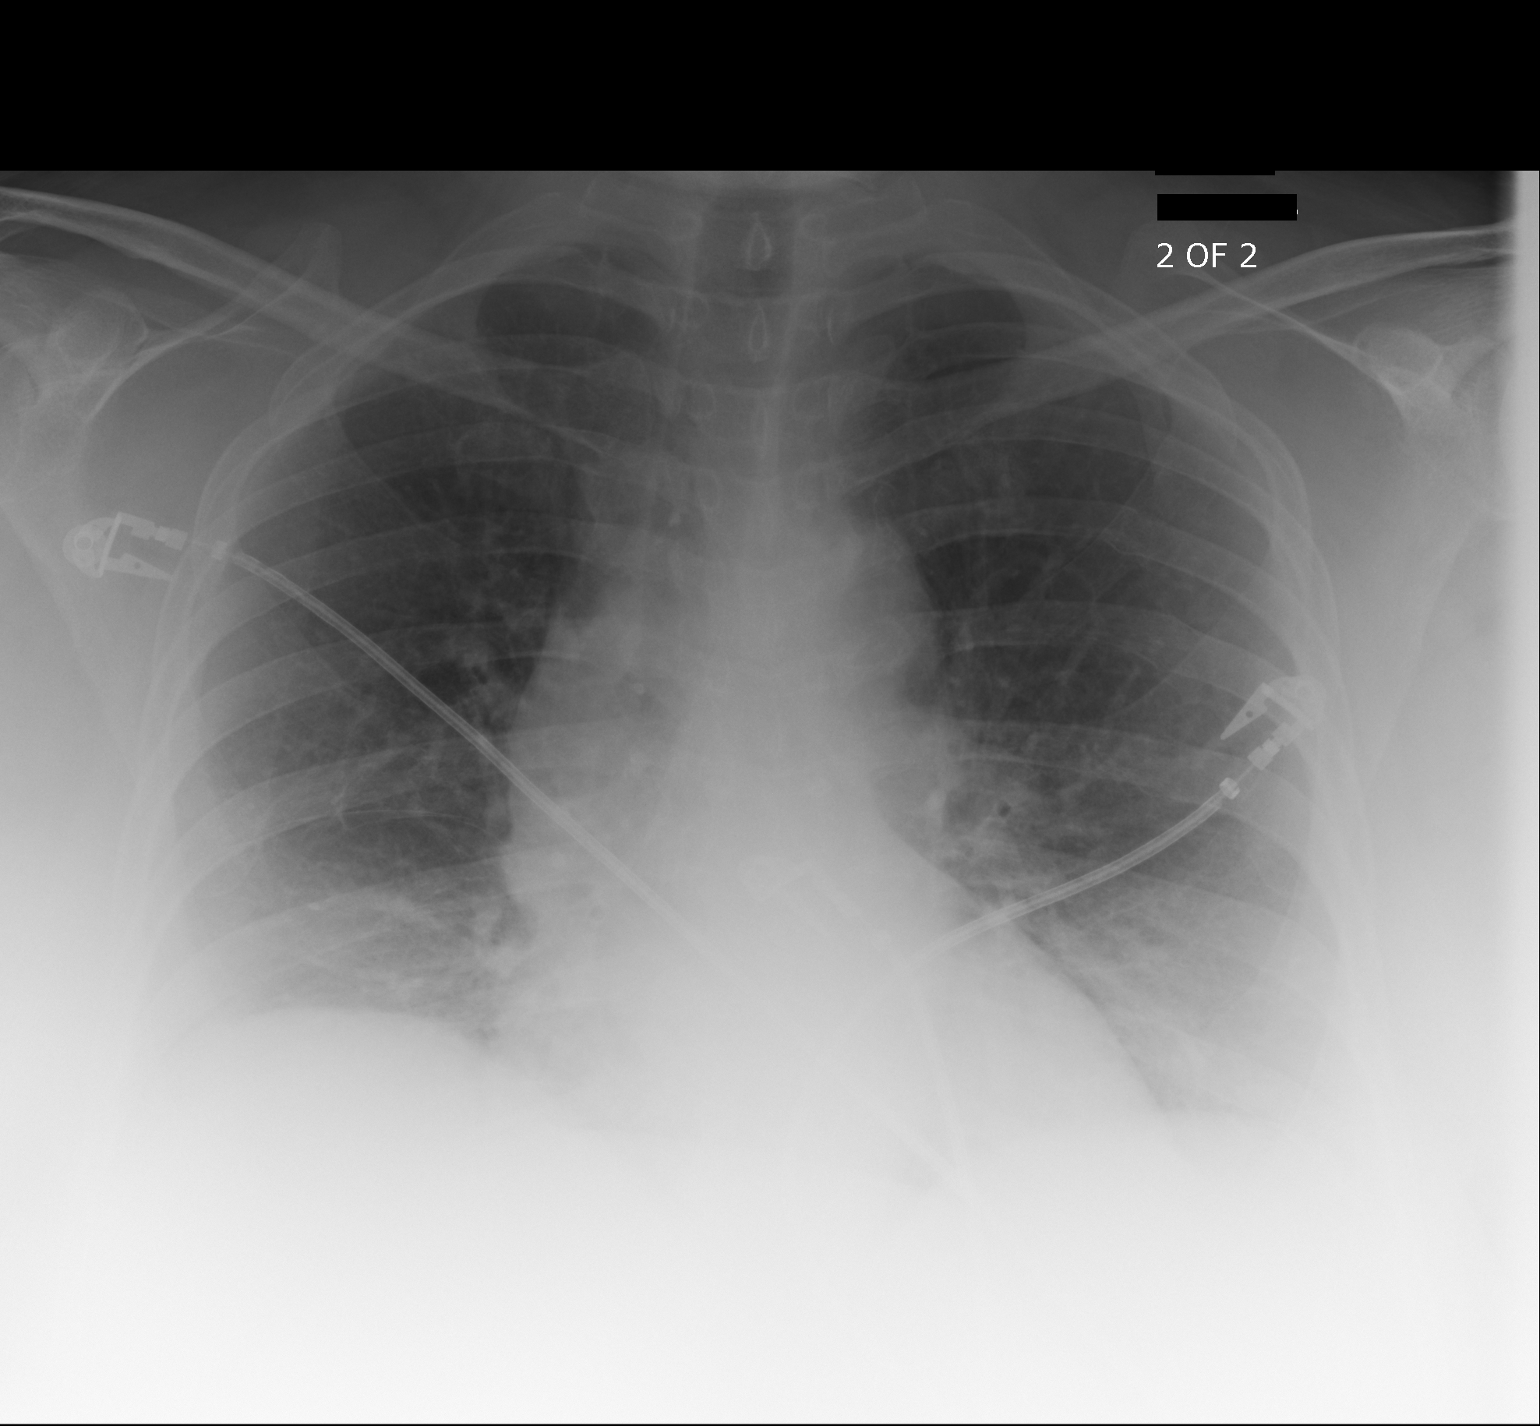

[2 of 2 positions shown; findings below may reference images not displayed]

FINDINGS: No active infiltrate or effusion is seen. Minimal basilar linear
atelectasis is noted. Heart size is stable. No bony abnormality is
seen.
IMPRESSION: Minimal basilar atelectasis.

## 2015-08-11 DIAGNOSIS — N823 Fistula of vagina to large intestine: Secondary | ICD-10-CM | POA: Diagnosis not present

## 2015-08-13 IMAGING — CT CT ABD-PELV W/ CM
2 of 5 series · 17 of 46 positions shown, 19 images · IV contrast (isovue)
Comparison: 06/09/2013 and prior CTs

CLINICAL DATA: 57-year-old female with abdominal and pelvic pain,
nausea vomiting.

EXAM:
CT ABDOMEN AND PELVIS WITH CONTRAST
TECHNIQUE: Multidetector CT imaging of the abdomen and pelvis was performed
using the standard protocol following bolus administration of
intravenous contrast.
CONTRAST:  125 cc intravenous Isovue 370

[Series 2: routine abd pel with · axial · 0.97mm/px · z∈[-1080,-616]mm · 14 of 105 slices shown, 16 images]
[im 6/105  soft-tissue]
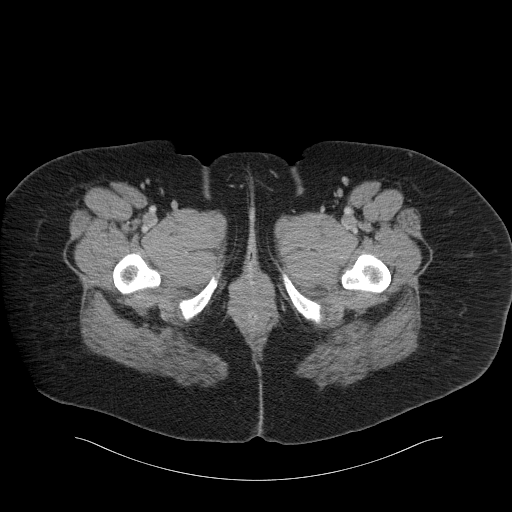
[im 6/105  bone]
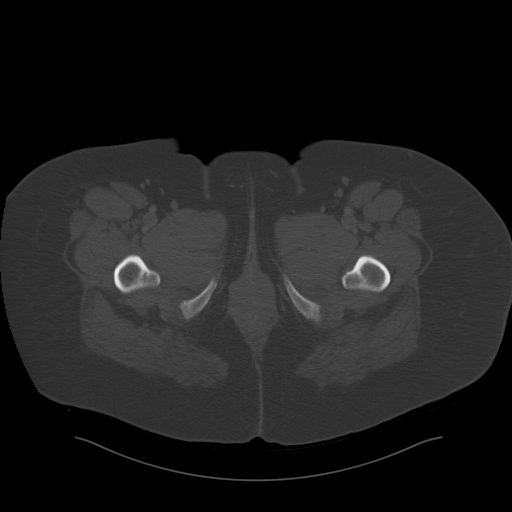
[im 12/105  soft-tissue]
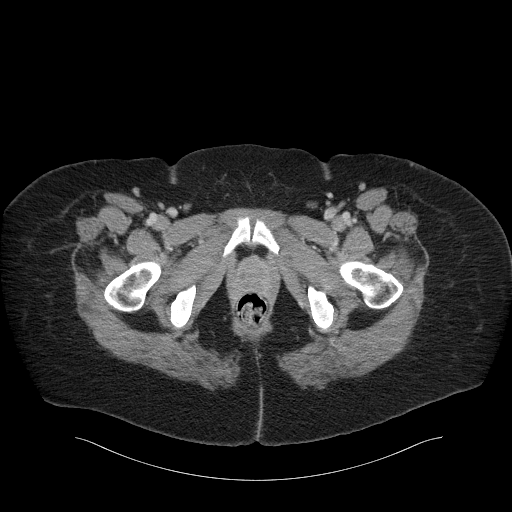
[im 24/105  soft-tissue]
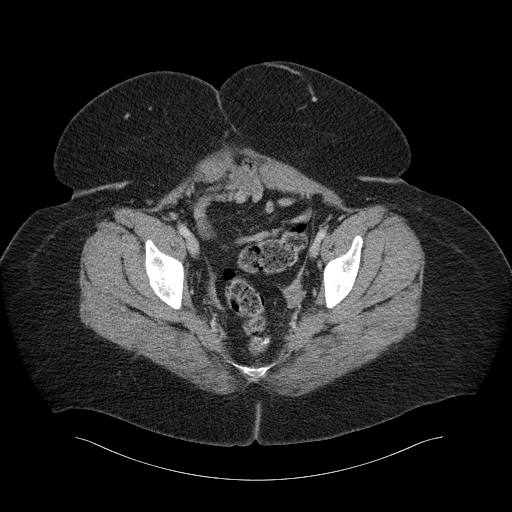
[im 29/105  soft-tissue]
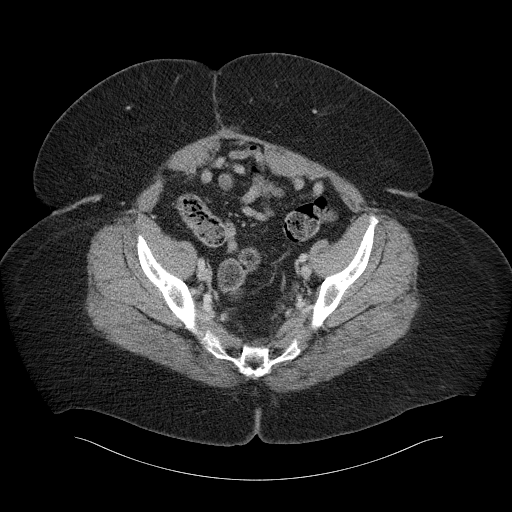
[im 35/105  soft-tissue]
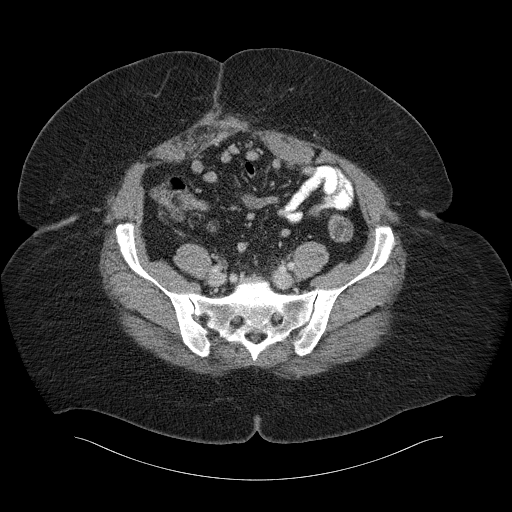
[im 41/105  soft-tissue]
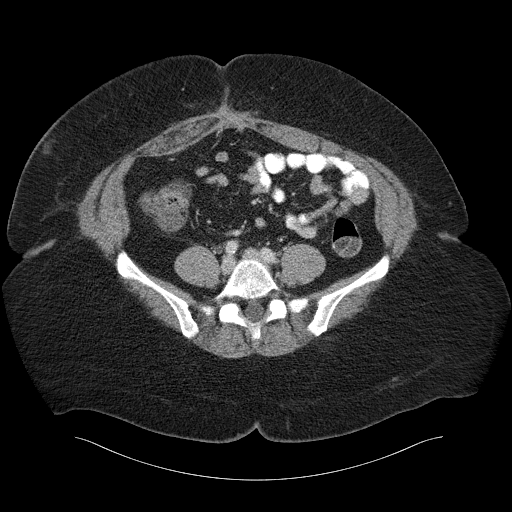
[im 47/105  soft-tissue]
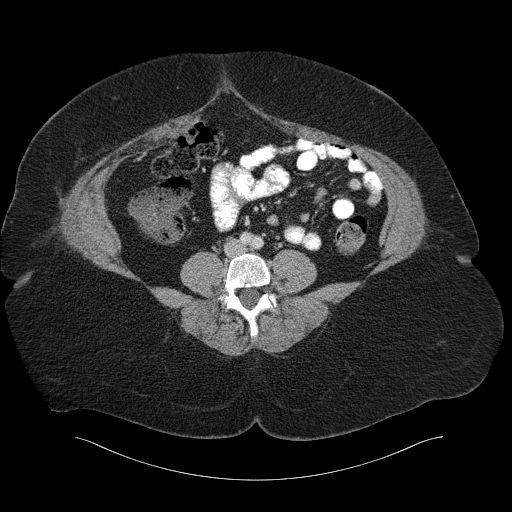
[im 58/105  soft-tissue]
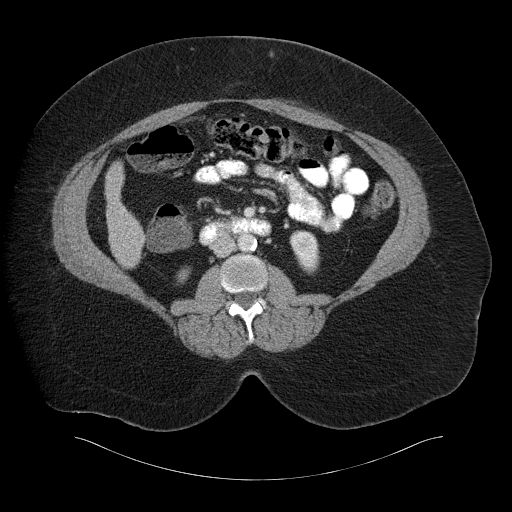
[im 64/105  soft-tissue]
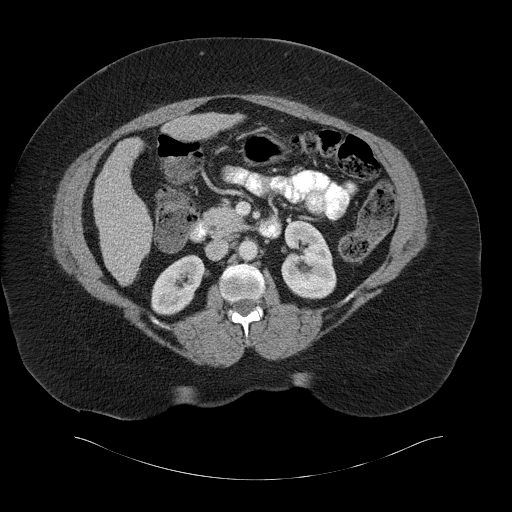
[im 64/105  bone]
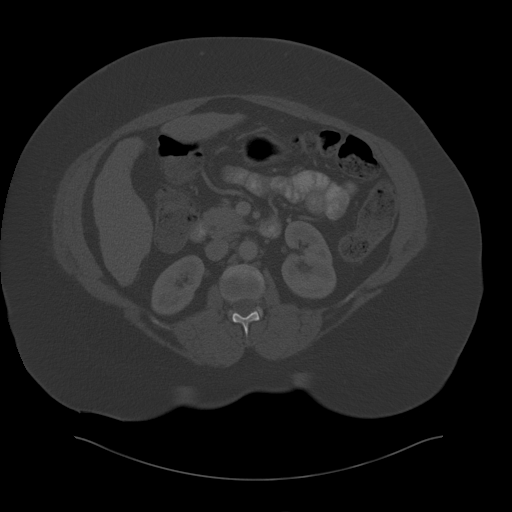
[im 70/105  soft-tissue]
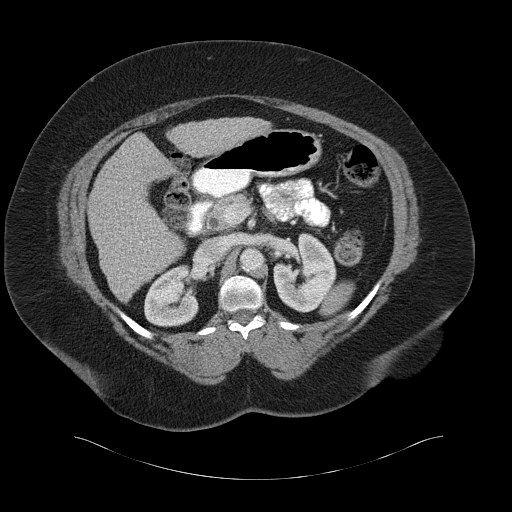
[im 76/105  soft-tissue]
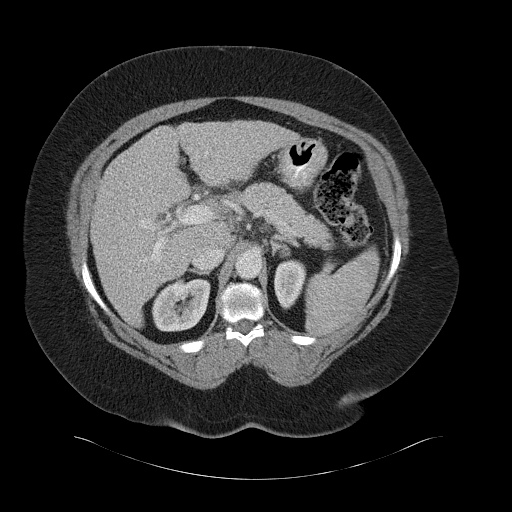
[im 81/105  soft-tissue]
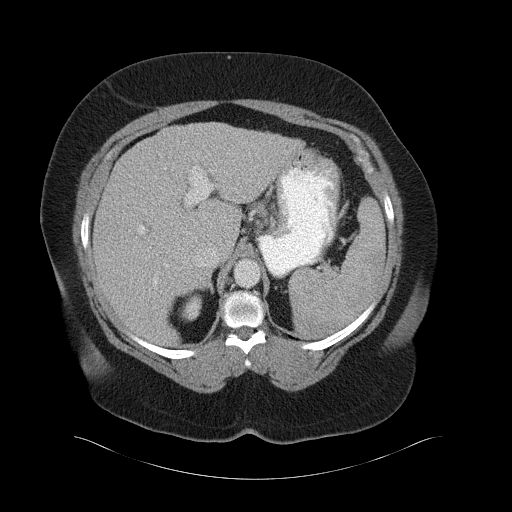
[im 93/105  soft-tissue]
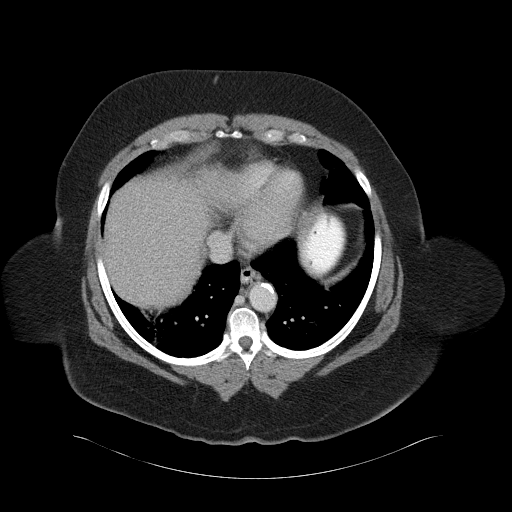
[im 99/105  soft-tissue]
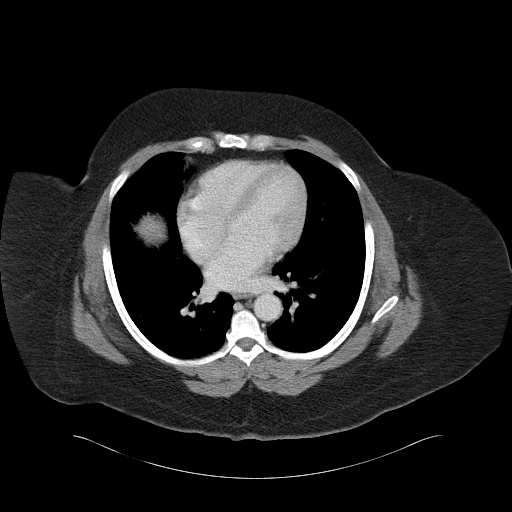

[Series 6: cor routine abd pel with · coronal · 1.02mm/px · 3 of 145 slices shown]
[im 49/145  soft-tissue]
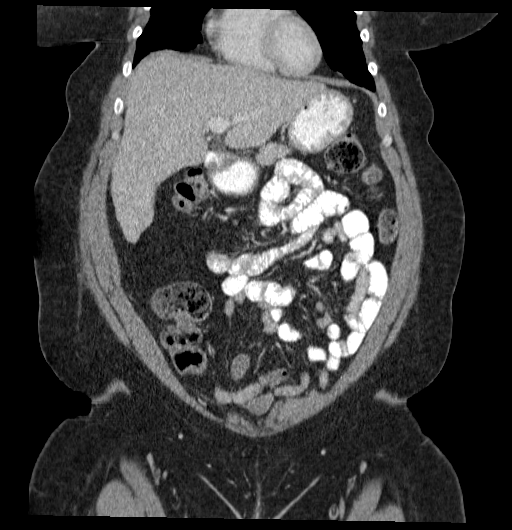
[im 65/145  soft-tissue]
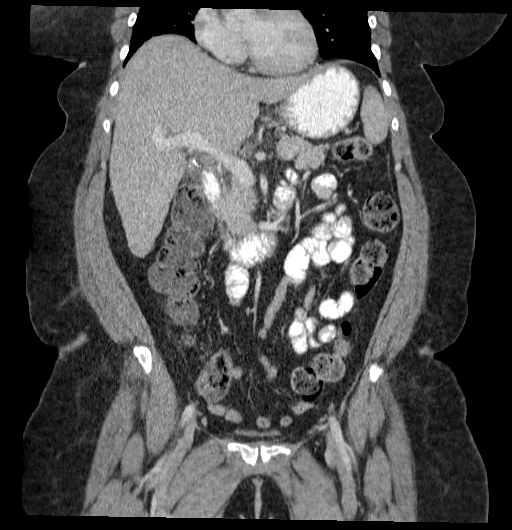
[im 81/145  soft-tissue]
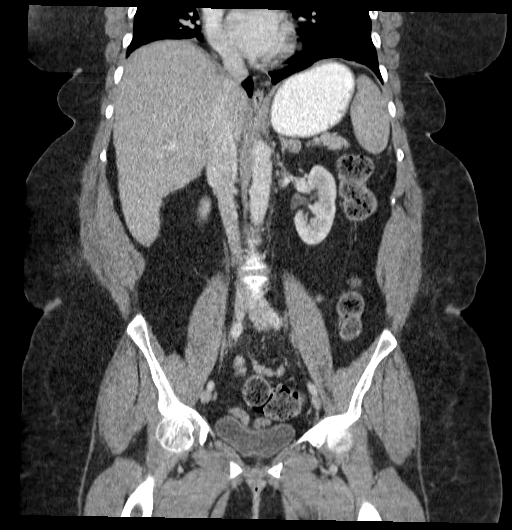

[17 of 46 positions shown; findings below may reference images not displayed]

FINDINGS: Cardiomegaly and mild bibasilar scarring again noted.

Cirrhosis is identified without focal hepatic mass.

Upper limits normal spleen size again noted.

The pancreas, kidneys and adrenal glands are unremarkable except for
stable left adrenal adenoma.

Please note that parenchymal abnormalities may be missed without
intravenous contrast.

The patient is status post cholecystectomy and hysterectomy.

There is no evidence of free fluid, enlarged lymph nodes, biliary
dilation or abdominal aortic aneurysm.

The bowel and bladder are unremarkable. There is no evidence of
bowel obstruction, abscess or pneumoperitoneum.

No acute or suspicious bony abnormalities are noted.
IMPRESSION: No acute abnormality.

Cirrhosis and upper limits normal spleen size.

Cardiomegaly.

## 2015-08-15 DIAGNOSIS — F29 Unspecified psychosis not due to a substance or known physiological condition: Secondary | ICD-10-CM | POA: Diagnosis not present

## 2015-08-16 ENCOUNTER — Emergency Department: Payer: Medicare Other

## 2015-08-16 ENCOUNTER — Encounter (HOSPITAL_COMMUNITY): Payer: Self-pay | Admitting: Certified Registered Nurse Anesthetist

## 2015-08-16 ENCOUNTER — Encounter: Payer: Self-pay | Admitting: *Deleted

## 2015-08-16 ENCOUNTER — Emergency Department
Admission: EM | Admit: 2015-08-16 | Discharge: 2015-08-16 | Disposition: A | Discharge status: Other Acute Inpatient Hospital | Payer: Medicare Other | Attending: Emergency Medicine | Admitting: Emergency Medicine

## 2015-08-16 ENCOUNTER — Inpatient Hospital Stay (HOSPITAL_COMMUNITY): Payer: Medicare Other | Admitting: Certified Registered Nurse Anesthetist

## 2015-08-16 ENCOUNTER — Inpatient Hospital Stay (HOSPITAL_COMMUNITY)
Admission: AD | Admit: 2015-08-16 | Discharge: 2015-08-30 | DRG: 027 | Disposition: A | Discharge status: Home Health | Payer: Medicare Other | Source: Other Acute Inpatient Hospital | Attending: Neurosurgery | Admitting: Neurosurgery

## 2015-08-16 ENCOUNTER — Encounter (HOSPITAL_COMMUNITY)
Admission: AD | Disposition: A | Discharge status: Home Health | Payer: Self-pay | Source: Other Acute Inpatient Hospital | Attending: Neurosurgery

## 2015-08-16 DIAGNOSIS — E119 Type 2 diabetes mellitus without complications: Secondary | ICD-10-CM | POA: Diagnosis not present

## 2015-08-16 DIAGNOSIS — F329 Major depressive disorder, single episode, unspecified: Secondary | ICD-10-CM | POA: Diagnosis present

## 2015-08-16 DIAGNOSIS — K219 Gastro-esophageal reflux disease without esophagitis: Secondary | ICD-10-CM | POA: Diagnosis not present

## 2015-08-16 DIAGNOSIS — S3992XA Unspecified injury of lower back, initial encounter: Secondary | ICD-10-CM | POA: Insufficient documentation

## 2015-08-16 DIAGNOSIS — J439 Emphysema, unspecified: Secondary | ICD-10-CM | POA: Diagnosis not present

## 2015-08-16 DIAGNOSIS — S0990XA Unspecified injury of head, initial encounter: Secondary | ICD-10-CM | POA: Diagnosis not present

## 2015-08-16 DIAGNOSIS — K1121 Acute sialoadenitis: Secondary | ICD-10-CM | POA: Diagnosis not present

## 2015-08-16 DIAGNOSIS — G40909 Epilepsy, unspecified, not intractable, without status epilepticus: Secondary | ICD-10-CM | POA: Diagnosis present

## 2015-08-16 DIAGNOSIS — R402363 Coma scale, best motor response, obeys commands, at hospital admission: Secondary | ICD-10-CM | POA: Diagnosis present

## 2015-08-16 DIAGNOSIS — F17210 Nicotine dependence, cigarettes, uncomplicated: Secondary | ICD-10-CM | POA: Insufficient documentation

## 2015-08-16 DIAGNOSIS — K7460 Unspecified cirrhosis of liver: Secondary | ICD-10-CM | POA: Diagnosis not present

## 2015-08-16 DIAGNOSIS — Z6839 Body mass index (BMI) 39.0-39.9, adult: Secondary | ICD-10-CM

## 2015-08-16 DIAGNOSIS — R402253 Coma scale, best verbal response, oriented, at hospital admission: Secondary | ICD-10-CM | POA: Diagnosis not present

## 2015-08-16 DIAGNOSIS — W1839XA Other fall on same level, initial encounter: Secondary | ICD-10-CM | POA: Insufficient documentation

## 2015-08-16 DIAGNOSIS — Z95828 Presence of other vascular implants and grafts: Secondary | ICD-10-CM

## 2015-08-16 DIAGNOSIS — Y9389 Activity, other specified: Secondary | ICD-10-CM | POA: Insufficient documentation

## 2015-08-16 DIAGNOSIS — S065X9A Traumatic subdural hemorrhage with loss of consciousness of unspecified duration, initial encounter: Secondary | ICD-10-CM

## 2015-08-16 DIAGNOSIS — N189 Chronic kidney disease, unspecified: Secondary | ICD-10-CM | POA: Insufficient documentation

## 2015-08-16 DIAGNOSIS — Z8673 Personal history of transient ischemic attack (TIA), and cerebral infarction without residual deficits: Secondary | ICD-10-CM | POA: Diagnosis not present

## 2015-08-16 DIAGNOSIS — S065XAA Traumatic subdural hemorrhage with loss of consciousness status unknown, initial encounter: Secondary | ICD-10-CM

## 2015-08-16 DIAGNOSIS — B1920 Unspecified viral hepatitis C without hepatic coma: Secondary | ICD-10-CM | POA: Diagnosis present

## 2015-08-16 DIAGNOSIS — R269 Unspecified abnormalities of gait and mobility: Secondary | ICD-10-CM | POA: Diagnosis not present

## 2015-08-16 DIAGNOSIS — R402143 Coma scale, eyes open, spontaneous, at hospital admission: Secondary | ICD-10-CM | POA: Diagnosis not present

## 2015-08-16 DIAGNOSIS — R55 Syncope and collapse: Secondary | ICD-10-CM | POA: Diagnosis not present

## 2015-08-16 DIAGNOSIS — M6281 Muscle weakness (generalized): Secondary | ICD-10-CM | POA: Diagnosis not present

## 2015-08-16 DIAGNOSIS — G8929 Other chronic pain: Secondary | ICD-10-CM | POA: Diagnosis not present

## 2015-08-16 DIAGNOSIS — Y9289 Other specified places as the place of occurrence of the external cause: Secondary | ICD-10-CM | POA: Diagnosis not present

## 2015-08-16 DIAGNOSIS — S065X0A Traumatic subdural hemorrhage without loss of consciousness, initial encounter: Secondary | ICD-10-CM | POA: Insufficient documentation

## 2015-08-16 DIAGNOSIS — J449 Chronic obstructive pulmonary disease, unspecified: Secondary | ICD-10-CM | POA: Diagnosis not present

## 2015-08-16 DIAGNOSIS — I69354 Hemiplegia and hemiparesis following cerebral infarction affecting left non-dominant side: Secondary | ICD-10-CM | POA: Diagnosis not present

## 2015-08-16 DIAGNOSIS — N12 Tubulo-interstitial nephritis, not specified as acute or chronic: Secondary | ICD-10-CM | POA: Diagnosis not present

## 2015-08-16 DIAGNOSIS — I129 Hypertensive chronic kidney disease with stage 1 through stage 4 chronic kidney disease, or unspecified chronic kidney disease: Secondary | ICD-10-CM | POA: Diagnosis present

## 2015-08-16 DIAGNOSIS — Z85841 Personal history of malignant neoplasm of brain: Secondary | ICD-10-CM

## 2015-08-16 DIAGNOSIS — Z880 Allergy status to penicillin: Secondary | ICD-10-CM | POA: Diagnosis not present

## 2015-08-16 DIAGNOSIS — Z79899 Other long term (current) drug therapy: Secondary | ICD-10-CM

## 2015-08-16 DIAGNOSIS — I10 Essential (primary) hypertension: Secondary | ICD-10-CM | POA: Diagnosis not present

## 2015-08-16 DIAGNOSIS — K625 Hemorrhage of anus and rectum: Secondary | ICD-10-CM | POA: Insufficient documentation

## 2015-08-16 DIAGNOSIS — R296 Repeated falls: Secondary | ICD-10-CM | POA: Diagnosis present

## 2015-08-16 DIAGNOSIS — R6884 Jaw pain: Secondary | ICD-10-CM

## 2015-08-16 DIAGNOSIS — W19XXXA Unspecified fall, initial encounter: Secondary | ICD-10-CM | POA: Diagnosis not present

## 2015-08-16 DIAGNOSIS — Z452 Encounter for adjustment and management of vascular access device: Secondary | ICD-10-CM | POA: Diagnosis not present

## 2015-08-16 DIAGNOSIS — Y998 Other external cause status: Secondary | ICD-10-CM | POA: Insufficient documentation

## 2015-08-16 DIAGNOSIS — R6889 Other general symptoms and signs: Secondary | ICD-10-CM | POA: Diagnosis not present

## 2015-08-16 DIAGNOSIS — Z7951 Long term (current) use of inhaled steroids: Secondary | ICD-10-CM

## 2015-08-16 DIAGNOSIS — I6200 Nontraumatic subdural hemorrhage, unspecified: Secondary | ICD-10-CM | POA: Diagnosis not present

## 2015-08-16 HISTORY — PX: CRANIOTOMY: SHX93

## 2015-08-16 LAB — POCT I-STAT 7, (LYTES, BLD GAS, ICA,H+H)
ACID-BASE DEFICIT: 2 mmol/L (ref 0.0–2.0)
ACID-BASE DEFICIT: 4 mmol/L — AB (ref 0.0–2.0)
BICARBONATE: 21.7 meq/L (ref 20.0–24.0)
Bicarbonate: 22.1 mEq/L (ref 20.0–24.0)
CALCIUM ION: 1.19 mmol/L (ref 1.12–1.23)
Calcium, Ion: 1.14 mmol/L (ref 1.12–1.23)
HCT: 28 % — ABNORMAL LOW (ref 36.0–46.0)
HCT: 25 % — ABNORMAL LOW (ref 36.0–46.0)
HEMOGLOBIN: 9.5 g/dL — AB (ref 12.0–15.0)
Hemoglobin: 8.5 g/dL — ABNORMAL LOW (ref 12.0–15.0)
O2 SAT: 95 %
O2 SAT: 98 %
PCO2 ART: 34.8 mmHg — AB (ref 35.0–45.0)
PCO2 ART: 41.3 mmHg (ref 35.0–45.0)
PH ART: 7.326 — AB (ref 7.350–7.450)
PO2 ART: 103 mmHg — AB (ref 80.0–100.0)
POTASSIUM: 3.8 mmol/L (ref 3.5–5.1)
Patient temperature: 36.4
Potassium: 3.6 mmol/L (ref 3.5–5.1)
SODIUM: 144 mmol/L (ref 135–145)
Sodium: 143 mmol/L (ref 135–145)
TCO2: 23 mmol/L (ref 0–100)
TCO2: 23 mmol/L (ref 0–100)
pH, Arterial: 7.41 (ref 7.350–7.450)
pO2, Arterial: 73 mmHg — ABNORMAL LOW (ref 80.0–100.0)

## 2015-08-16 LAB — URINALYSIS COMPLETE WITH MICROSCOPIC (ARMC ONLY)
Bilirubin Urine: NEGATIVE
GLUCOSE, UA: NEGATIVE mg/dL
Ketones, ur: NEGATIVE mg/dL
Nitrite: POSITIVE — AB
Protein, ur: NEGATIVE mg/dL
Specific Gravity, Urine: 1.012 (ref 1.005–1.030)
pH: 6 (ref 5.0–8.0)

## 2015-08-16 LAB — COMPREHENSIVE METABOLIC PANEL
ALBUMIN: 3.5 g/dL (ref 3.5–5.0)
ALT: 30 U/L (ref 14–54)
ANION GAP: 8 (ref 5–15)
AST: 60 U/L — ABNORMAL HIGH (ref 15–41)
Alkaline Phosphatase: 162 U/L — ABNORMAL HIGH (ref 38–126)
BUN: 6 mg/dL (ref 6–20)
CALCIUM: 9 mg/dL (ref 8.9–10.3)
CHLORIDE: 111 mmol/L (ref 101–111)
CO2: 21 mmol/L — AB (ref 22–32)
Creatinine, Ser: 0.74 mg/dL (ref 0.44–1.00)
GFR calc non Af Amer: >60 mL/min (ref >60)
GLUCOSE: 99 mg/dL (ref 65–99)
POTASSIUM: 3.8 mmol/L (ref 3.5–5.1)
SODIUM: 140 mmol/L (ref 135–145)
Total Bilirubin: 1 mg/dL (ref 0.3–1.2)
Total Protein: 7.6 g/dL (ref 6.5–8.1)

## 2015-08-16 LAB — TROPONIN I

## 2015-08-16 LAB — GLUCOSE, CAPILLARY: Glucose-Capillary: 94 mg/dL (ref 65–99)

## 2015-08-16 SURGERY — CRANIOTOMY HEMATOMA EVACUATION SUBDURAL
Anesthesia: General | Site: Head | Laterality: Right

## 2015-08-16 MED ORDER — DOCUSATE SODIUM 100 MG PO CAPS: 100.0000 mg | ORAL_CAPSULE | Freq: Two times a day (BID) | ORAL | Status: DC

## 2015-08-16 MED ORDER — SODIUM CHLORIDE 0.9 % IV SOLN
500.0000 mg | INTRAVENOUS | Status: AC
  Administered 2015-08-16: 500 mg via INTRAVENOUS
  Filled 2015-08-16: qty 5

## 2015-08-16 MED ORDER — TRIAMCINOLONE ACETONIDE 55 MCG/ACT NA AERO: 2.0000 | INHALATION_SPRAY | Freq: Every day | NASAL | Status: DC | PRN

## 2015-08-16 MED ORDER — LACTULOSE 10 GM/15ML PO SOLN: 20.0000 g | Freq: Every day | ORAL | Status: DC | PRN

## 2015-08-16 MED ORDER — METRONIDAZOLE 500 MG PO TABS
500.0000 mg | ORAL_TABLET | Freq: Three times a day (TID) | ORAL | Status: DC
  Administered 2015-08-17 – 2015-08-24 (×22): 500 mg via ORAL
  Filled 2015-08-16 (×25): qty 1

## 2015-08-16 MED ORDER — SUGAMMADEX SODIUM 200 MG/2ML IV SOLN
INTRAVENOUS | Status: DC | PRN
  Administered 2015-08-16: 240 mg via INTRAVENOUS

## 2015-08-16 MED ORDER — SERTRALINE HCL 50 MG PO TABS
50.0000 mg | ORAL_TABLET | Freq: Every day | ORAL | Status: DC
  Administered 2015-08-17 – 2015-08-30 (×14): 50 mg via ORAL
  Filled 2015-08-16 (×15): qty 1

## 2015-08-16 MED ORDER — MORPHINE SULFATE (PF) 4 MG/ML IV SOLN
4.0000 mg | Freq: Once | INTRAVENOUS | Status: AC
  Administered 2015-08-16: 4 mg via INTRAVENOUS
  Filled 2015-08-16: qty 1

## 2015-08-16 MED ORDER — ROCURONIUM BROMIDE 100 MG/10ML IV SOLN
INTRAVENOUS | Status: DC | PRN
  Administered 2015-08-16 (×2): 10 mg via INTRAVENOUS
  Administered 2015-08-16: 5 mg via INTRAVENOUS
  Administered 2015-08-16: 10 mg via INTRAVENOUS
  Administered 2015-08-16: 50 mg via INTRAVENOUS
  Administered 2015-08-16: 5 mg via INTRAVENOUS
  Administered 2015-08-16: 10 mg via INTRAVENOUS

## 2015-08-16 MED ORDER — PRAVASTATIN SODIUM 20 MG PO TABS
10.0000 mg | ORAL_TABLET | Freq: Every day | ORAL | Status: DC
  Administered 2015-08-17 – 2015-08-29 (×13): 10 mg via ORAL
  Filled 2015-08-16 (×13): qty 1

## 2015-08-16 MED ORDER — NALOXONE HCL 0.4 MG/ML IJ SOLN: 0.0800 mg | INTRAMUSCULAR | Status: DC | PRN

## 2015-08-16 MED ORDER — BISACODYL 5 MG PO TBEC: 5.0000 mg | DELAYED_RELEASE_TABLET | Freq: Every day | ORAL | Status: DC | PRN

## 2015-08-16 MED ORDER — ALBUTEROL SULFATE HFA 108 (90 BASE) MCG/ACT IN AERS
INHALATION_SPRAY | RESPIRATORY_TRACT | Status: DC | PRN
  Administered 2015-08-16: 6 via RESPIRATORY_TRACT

## 2015-08-16 MED ORDER — POTASSIUM CHLORIDE IN NACL 20-0.9 MEQ/L-% IV SOLN: INTRAVENOUS | Status: DC

## 2015-08-16 MED ORDER — 0.9 % SODIUM CHLORIDE (POUR BTL) OPTIME
TOPICAL | Status: DC | PRN
  Administered 2015-08-16 (×2): 1000 mL

## 2015-08-16 MED ORDER — PANTOPRAZOLE SODIUM 40 MG PO TBEC: 40.0000 mg | DELAYED_RELEASE_TABLET | Freq: Every day | ORAL | Status: DC

## 2015-08-16 MED ORDER — PROMETHAZINE HCL 25 MG PO TABS: 12.5000 mg | ORAL_TABLET | ORAL | Status: DC | PRN

## 2015-08-16 MED ORDER — DOCUSATE SODIUM 100 MG PO CAPS
100.0000 mg | ORAL_CAPSULE | Freq: Two times a day (BID) | ORAL | Status: DC
  Administered 2015-08-17 – 2015-08-30 (×24): 100 mg via ORAL
  Filled 2015-08-16 (×25): qty 1

## 2015-08-16 MED ORDER — HEMOSTATIC AGENTS (NO CHARGE) OPTIME
TOPICAL | Status: DC | PRN
  Administered 2015-08-16: 1 via TOPICAL

## 2015-08-16 MED ORDER — SUGAMMADEX SODIUM 200 MG/2ML IV SOLN
INTRAVENOUS | Status: AC
  Filled 2015-08-16: qty 2

## 2015-08-16 MED ORDER — PROMETHAZINE HCL 25 MG PO TABS
12.5000 mg | ORAL_TABLET | ORAL | Status: DC | PRN
  Administered 2015-08-17 – 2015-08-27 (×3): 25 mg via ORAL
  Filled 2015-08-16 (×3): qty 1

## 2015-08-16 MED ORDER — LABETALOL HCL 5 MG/ML IV SOLN: 10.0000 mg | INTRAVENOUS | Status: DC | PRN

## 2015-08-16 MED ORDER — SODIUM CHLORIDE 0.9 % IV SOLN
0.0125 ug/kg/min | INTRAVENOUS | Status: DC
  Filled 2015-08-16: qty 2000

## 2015-08-16 MED ORDER — THROMBIN 20000 UNITS EX SOLR
CUTANEOUS | Status: DC | PRN
  Administered 2015-08-16: 19:00:00 via TOPICAL

## 2015-08-16 MED ORDER — MAGNESIUM CITRATE PO SOLN: 1.0000 | Freq: Once | ORAL | Status: DC | PRN

## 2015-08-16 MED ORDER — ONDANSETRON HCL 4 MG/2ML IJ SOLN
4.0000 mg | INTRAMUSCULAR | Status: DC | PRN
  Administered 2015-08-18: 4 mg via INTRAVENOUS
  Filled 2015-08-16 (×2): qty 2

## 2015-08-16 MED ORDER — TIOTROPIUM BROMIDE MONOHYDRATE 18 MCG IN CAPS: 18.0000 ug | ORAL_CAPSULE | Freq: Two times a day (BID) | RESPIRATORY_TRACT | Status: DC | PRN

## 2015-08-16 MED ORDER — IPRATROPIUM-ALBUTEROL 0.5-2.5 (3) MG/3ML IN SOLN
3.0000 mL | Freq: Four times a day (QID) | RESPIRATORY_TRACT | Status: DC
  Administered 2015-08-17 – 2015-08-18 (×5): 3 mL via RESPIRATORY_TRACT
  Filled 2015-08-16 (×6): qty 3

## 2015-08-16 MED ORDER — PANTOPRAZOLE SODIUM 40 MG IV SOLR
40.0000 mg | Freq: Every day | INTRAVENOUS | Status: DC
  Administered 2015-08-16 – 2015-08-17 (×2): 40 mg via INTRAVENOUS
  Filled 2015-08-16 (×2): qty 40

## 2015-08-16 MED ORDER — METOPROLOL SUCCINATE ER 25 MG PO TB24
25.0000 mg | ORAL_TABLET | Freq: Every day | ORAL | Status: DC
Start: 2015-08-17 — End: 2015-08-30
  Administered 2015-08-17 – 2015-08-30 (×11): 25 mg via ORAL
  Filled 2015-08-16 (×14): qty 1

## 2015-08-16 MED ORDER — FLUCONAZOLE 150 MG PO TABS
150.0000 mg | ORAL_TABLET | Freq: Once | ORAL | Status: AC
Start: 2015-08-16 — End: 2015-08-17
  Administered 2015-08-17: 150 mg via ORAL
  Filled 2015-08-16: qty 1

## 2015-08-16 MED ORDER — ALBUMIN HUMAN 5 % IV SOLN
INTRAVENOUS | Status: DC | PRN
  Administered 2015-08-16: 19:00:00 via INTRAVENOUS

## 2015-08-16 MED ORDER — MORPHINE SULFATE (PF) 2 MG/ML IV SOLN: 1.0000 mg | INTRAVENOUS | Status: DC | PRN

## 2015-08-16 MED ORDER — ALBUTEROL SULFATE (2.5 MG/3ML) 0.083% IN NEBU: 2.5000 mg | INHALATION_SOLUTION | RESPIRATORY_TRACT | Status: DC | PRN

## 2015-08-16 MED ORDER — MOMETASONE FURO-FORMOTEROL FUM 200-5 MCG/ACT IN AERO
2.0000 | INHALATION_SPRAY | Freq: Two times a day (BID) | RESPIRATORY_TRACT | Status: DC
Start: 2015-08-16 — End: 2015-08-30
  Administered 2015-08-17 – 2015-08-30 (×21): 2 via RESPIRATORY_TRACT
  Filled 2015-08-16 (×4): qty 8.8

## 2015-08-16 MED ORDER — CEFUROXIME AXETIL 500 MG PO TABS: 500.0000 mg | ORAL_TABLET | Freq: Two times a day (BID) | ORAL | Status: DC

## 2015-08-16 MED ORDER — ACETAMINOPHEN 650 MG RE SUPP: 650.0000 mg | RECTAL | Status: DC | PRN

## 2015-08-16 MED ORDER — FENTANYL CITRATE (PF) 100 MCG/2ML IJ SOLN
INTRAMUSCULAR | Status: DC | PRN
  Administered 2015-08-16 (×2): 50 ug via INTRAVENOUS

## 2015-08-16 MED ORDER — PROPOFOL 10 MG/ML IV BOLUS
INTRAVENOUS | Status: DC | PRN
  Administered 2015-08-16: 10 mg via INTRAVENOUS
  Administered 2015-08-16: 20 mg via INTRAVENOUS
  Administered 2015-08-16: 30 mg via INTRAVENOUS

## 2015-08-16 MED ORDER — FUROSEMIDE 20 MG PO TABS
20.0000 mg | ORAL_TABLET | Freq: Every day | ORAL | Status: DC
  Administered 2015-08-17 – 2015-08-30 (×14): 20 mg via ORAL
  Filled 2015-08-16 (×14): qty 1

## 2015-08-16 MED ORDER — LIDOCAINE-EPINEPHRINE 0.5 %-1:200000 IJ SOLN
INTRAMUSCULAR | Status: DC | PRN
  Administered 2015-08-16: 19 mL

## 2015-08-16 MED ORDER — SODIUM CHLORIDE 0.9 % IV SOLN
500.0000 mg | Freq: Two times a day (BID) | INTRAVENOUS | Status: DC
Start: 2015-08-16 — End: 2015-08-19
  Administered 2015-08-16 – 2015-08-19 (×5): 500 mg via INTRAVENOUS
  Filled 2015-08-16 (×7): qty 5

## 2015-08-16 MED ORDER — ACETAMINOPHEN 325 MG PO TABS
650.0000 mg | ORAL_TABLET | ORAL | Status: DC | PRN
  Administered 2015-08-17 – 2015-08-28 (×8): 650 mg via ORAL
  Filled 2015-08-16 (×8): qty 2

## 2015-08-16 MED ORDER — SODIUM CHLORIDE 0.9 % IV SOLN
INTRAVENOUS | Status: DC | PRN
  Administered 2015-08-16: 17:00:00 via INTRAVENOUS

## 2015-08-16 MED ORDER — FENTANYL CITRATE (PF) 250 MCG/5ML IJ SOLN
INTRAMUSCULAR | Status: AC
  Filled 2015-08-16: qty 5

## 2015-08-16 MED ORDER — LIDOCAINE HCL (CARDIAC) 20 MG/ML IV SOLN
INTRAVENOUS | Status: DC | PRN
  Administered 2015-08-16: 40 mg via INTRAVENOUS

## 2015-08-16 MED ORDER — POTASSIUM CHLORIDE IN NACL 20-0.9 MEQ/L-% IV SOLN
INTRAVENOUS | Status: DC
  Administered 2015-08-16: 22:00:00 via INTRAVENOUS
  Administered 2015-08-17: 80 mL/h via INTRAVENOUS
  Administered 2015-08-18 (×2): via INTRAVENOUS
  Filled 2015-08-16 (×9): qty 1000

## 2015-08-16 MED ORDER — MORPHINE SULFATE (PF) 2 MG/ML IV SOLN
1.0000 mg | INTRAVENOUS | Status: DC | PRN
  Administered 2015-08-16: 2 mg via INTRAVENOUS
  Administered 2015-08-17: 1 mg via INTRAVENOUS
  Administered 2015-08-17: 2 mg via INTRAVENOUS
  Administered 2015-08-17: 1 mg via INTRAVENOUS
  Administered 2015-08-17 – 2015-08-27 (×13): 2 mg via INTRAVENOUS
  Filled 2015-08-16 (×16): qty 1

## 2015-08-16 MED ORDER — HYDROCODONE-ACETAMINOPHEN 5-325 MG PO TABS
1.0000 | ORAL_TABLET | ORAL | Status: DC | PRN
  Administered 2015-08-17 (×3): 1 via ORAL
  Filled 2015-08-16 (×3): qty 1

## 2015-08-16 MED ORDER — ONDANSETRON HCL 4 MG PO TABS
4.0000 mg | ORAL_TABLET | ORAL | Status: DC | PRN
  Administered 2015-08-22 – 2015-08-24 (×3): 4 mg via ORAL
  Filled 2015-08-16 (×3): qty 1

## 2015-08-16 MED ORDER — HYDROCODONE-ACETAMINOPHEN 5-325 MG PO TABS: 1.0000 | ORAL_TABLET | ORAL | Status: DC | PRN

## 2015-08-16 MED ORDER — SENNOSIDES-DOCUSATE SODIUM 8.6-50 MG PO TABS: 1.0000 | ORAL_TABLET | Freq: Every evening | ORAL | Status: DC | PRN

## 2015-08-16 MED ORDER — PHENYLEPHRINE HCL 10 MG/ML IJ SOLN
10.0000 mg | INTRAVENOUS | Status: DC | PRN
  Administered 2015-08-16: 20 ug/min via INTRAVENOUS

## 2015-08-16 MED ORDER — AMITRIPTYLINE HCL 25 MG PO TABS
50.0000 mg | ORAL_TABLET | Freq: Every day | ORAL | Status: DC
  Administered 2015-08-17 – 2015-08-29 (×13): 50 mg via ORAL
  Filled 2015-08-16: qty 1
  Filled 2015-08-16 (×3): qty 2
  Filled 2015-08-16 (×3): qty 1
  Filled 2015-08-16 (×6): qty 2
  Filled 2015-08-16: qty 1
  Filled 2015-08-16: qty 2

## 2015-08-16 MED ORDER — ONDANSETRON HCL 4 MG/2ML IJ SOLN
4.0000 mg | Freq: Once | INTRAMUSCULAR | Status: AC
  Administered 2015-08-16: 4 mg via INTRAVENOUS
  Filled 2015-08-16: qty 2

## 2015-08-16 MED ORDER — VANCOMYCIN HCL IN DEXTROSE 1-5 GM/200ML-% IV SOLN
INTRAVENOUS | Status: AC
  Administered 2015-08-16: 1000 mg via INTRAVENOUS
  Filled 2015-08-16: qty 200

## 2015-08-16 MED ORDER — PANCRELIPASE (LIP-PROT-AMYL) 12000-38000 UNITS PO CPEP
24000.0000 [IU] | ORAL_CAPSULE | Freq: Three times a day (TID) | ORAL | Status: DC
  Administered 2015-08-17 – 2015-08-30 (×40): 24000 [IU] via ORAL
  Filled 2015-08-16 (×43): qty 2

## 2015-08-16 MED ORDER — LEVETIRACETAM 500 MG PO TABS: 500.0000 mg | ORAL_TABLET | Freq: Two times a day (BID) | ORAL | Status: DC

## 2015-08-16 MED ORDER — GABAPENTIN 300 MG PO CAPS
300.0000 mg | ORAL_CAPSULE | Freq: Three times a day (TID) | ORAL | Status: DC
  Administered 2015-08-17 – 2015-08-30 (×40): 300 mg via ORAL
  Filled 2015-08-16 (×41): qty 1

## 2015-08-16 MED ORDER — SODIUM CHLORIDE 0.9 % IV SOLN
0.0125 ug/kg/min | INTRAVENOUS | Status: AC
  Administered 2015-08-16: .2 ug/kg/min via INTRAVENOUS
  Filled 2015-08-16: qty 2000

## 2015-08-16 MED ORDER — FENTANYL CITRATE (PF) 100 MCG/2ML IJ SOLN: 25.0000 ug | INTRAMUSCULAR | Status: DC | PRN

## 2015-08-16 SURGICAL SUPPLY — 76 items
BANDAGE GAUZE 4  KLING STR (GAUZE/BANDAGES/DRESSINGS) ×3 IMPLANT
BENZOIN TINCTURE PRP APPL 2/3 (GAUZE/BANDAGES/DRESSINGS) IMPLANT
BLADE CLIPPER SURG (BLADE) ×3 IMPLANT
BLADE ULTRA TIP 2M (BLADE) IMPLANT
BNDG GAUZE ELAST 4 BULKY (GAUZE/BANDAGES/DRESSINGS) ×3 IMPLANT
BRUSH SCRUB EZ 1% IODOPHOR (MISCELLANEOUS) IMPLANT
BUR ACORN 6.0 PRECISION (BURR) ×2 IMPLANT
BUR ACORN 6.0MM PRECISION (BURR) ×1
BUR ADDG 1.1 (BURR) IMPLANT
BUR ADDG 1.1MM (BURR)
BUR MATCHSTICK NEURO 3.0 LAGG (BURR) IMPLANT
BUR SPIRAL ROUTER 2.3 (BUR) ×2 IMPLANT
BUR SPIRAL ROUTER 2.3MM (BUR) ×1
CANISTER SUCT 3000ML PPV (MISCELLANEOUS) ×6 IMPLANT
CLIP RANEY DISP (INSTRUMENTS) ×3 IMPLANT
CLIP TI MEDIUM 6 (CLIP) IMPLANT
DRAIN JACKSON PRATT 10MM FLAT (MISCELLANEOUS) ×3 IMPLANT
DRAIN SNY WOU 7FLT (WOUND CARE) IMPLANT
DRAPE NEUROLOGICAL W/INCISE (DRAPES) ×3 IMPLANT
DRAPE SURG 17X23 STRL (DRAPES) IMPLANT
DRAPE WARM FLUID 44X44 (DRAPE) ×3 IMPLANT
DRSG OPSITE POSTOP 4X8 (GAUZE/BANDAGES/DRESSINGS) ×3 IMPLANT
DURAMATRIX ONLAY 3X3 (Plate) ×3 IMPLANT
DURAPREP 6ML APPLICATOR 50/CS (WOUND CARE) ×3 IMPLANT
ELECT CAUTERY BLADE 6.4 (BLADE) IMPLANT
ELECT REM PT RETURN 9FT ADLT (ELECTROSURGICAL) ×3
ELECTRODE REM PT RTRN 9FT ADLT (ELECTROSURGICAL) ×1 IMPLANT
EVACUATOR 1/8 PVC DRAIN (DRAIN) IMPLANT
EVACUATOR SILICONE 100CC (DRAIN) ×3 IMPLANT
GAUZE SPONGE 4X4 12PLY STRL (GAUZE/BANDAGES/DRESSINGS) ×3 IMPLANT
GAUZE SPONGE 4X4 16PLY XRAY LF (GAUZE/BANDAGES/DRESSINGS) IMPLANT
GLOVE BIO SURGEON STRL SZ 6.5 (GLOVE) ×4 IMPLANT
GLOVE BIO SURGEONS STRL SZ 6.5 (GLOVE) ×2
GLOVE BIOGEL PI IND STRL 6.5 (GLOVE) ×1 IMPLANT
GLOVE BIOGEL PI IND STRL 7.0 (GLOVE) ×2 IMPLANT
GLOVE BIOGEL PI INDICATOR 6.5 (GLOVE) ×2
GLOVE BIOGEL PI INDICATOR 7.0 (GLOVE) ×4
GLOVE ECLIPSE 6.5 STRL STRAW (GLOVE) ×3 IMPLANT
GLOVE EXAM NITRILE LRG STRL (GLOVE) IMPLANT
GLOVE EXAM NITRILE MD LF STRL (GLOVE) IMPLANT
GLOVE EXAM NITRILE XL STR (GLOVE) IMPLANT
GLOVE EXAM NITRILE XS STR PU (GLOVE) IMPLANT
GOWN STRL REUS W/ TWL LRG LVL3 (GOWN DISPOSABLE) ×3 IMPLANT
GOWN STRL REUS W/ TWL XL LVL3 (GOWN DISPOSABLE) IMPLANT
GOWN STRL REUS W/TWL 2XL LVL3 (GOWN DISPOSABLE) IMPLANT
GOWN STRL REUS W/TWL LRG LVL3 (GOWN DISPOSABLE) ×6
GOWN STRL REUS W/TWL XL LVL3 (GOWN DISPOSABLE)
GRAFT DURAGEN MATRIX 2WX2L ×3 IMPLANT
HEMOSTAT SURGICEL 2X14 (HEMOSTASIS) ×3 IMPLANT
HOOK DURA 1/2IN (MISCELLANEOUS) ×3 IMPLANT
KIT BASIN OR (CUSTOM PROCEDURE TRAY) ×3 IMPLANT
KIT ROOM TURNOVER OR (KITS) ×3 IMPLANT
NEEDLE HYPO 25X1 1.5 SAFETY (NEEDLE) ×3 IMPLANT
NS IRRIG 1000ML POUR BTL (IV SOLUTION) ×6 IMPLANT
PACK CRANIOTOMY (CUSTOM PROCEDURE TRAY) ×3 IMPLANT
PATTIES SURGICAL .5 X.5 (GAUZE/BANDAGES/DRESSINGS) IMPLANT
PATTIES SURGICAL .5 X3 (DISPOSABLE) IMPLANT
PATTIES SURGICAL 1X1 (DISPOSABLE) IMPLANT
SPONGE NEURO XRAY DETECT 1X3 (DISPOSABLE) IMPLANT
SPONGE SURGIFOAM ABS GEL 100 (HEMOSTASIS) ×3 IMPLANT
STAPLER VISISTAT 35W (STAPLE) ×3 IMPLANT
SUT ETHILON 3 0 FSL (SUTURE) IMPLANT
SUT ETHILON 3 0 PS 1 (SUTURE) IMPLANT
SUT NURALON 4 0 TR CR/8 (SUTURE) ×6 IMPLANT
SUT PL GUT 3 0 FS 1 (SUTURE) IMPLANT
SUT STEEL 0 (SUTURE)
SUT STEEL 0 18XMFL TIE 17 (SUTURE) IMPLANT
SUT VIC AB 2-0 CT2 18 VCP726D (SUTURE) ×6 IMPLANT
SYR CONTROL 10ML LL (SYRINGE) IMPLANT
TOWEL OR 17X24 6PK STRL BLUE (TOWEL DISPOSABLE) IMPLANT
TOWEL OR 17X26 10 PK STRL BLUE (TOWEL DISPOSABLE) ×3 IMPLANT
TRAY FOLEY W/METER SILVER 14FR (SET/KITS/TRAYS/PACK) ×3 IMPLANT
TUBE CONNECTING 12'X1/4 (SUCTIONS) ×1
TUBE CONNECTING 12X1/4 (SUCTIONS) ×2 IMPLANT
UNDERPAD 30X30 INCONTINENT (UNDERPADS AND DIAPERS) IMPLANT
WATER STERILE IRR 1000ML POUR (IV SOLUTION) ×3 IMPLANT

## 2015-08-16 NOTE — ED Notes (Signed)
Pt arrives via EMS from home, states she was told she had a stroke last week and has been falling, states she has a rectal bleeding that is being tx, states back pain and a kidney infection, pt has multiple complaints but when asked why she came to the ER can not given a specific answer, MD at bedside upon arrival, pt awake and alert

## 2015-08-16 NOTE — ED Provider Notes (Signed)
[facility] Emergency Department Provider Note     Time seen: ----------------------------------------- 10:01 AM on 08/16/2015 -----------------------------------------    I have reviewed the triage vital signs and the nursing notes.   HISTORY  Chief Complaint Back Pain    HPI [name] is a 60 y.o. female well-known to the ER who presents for frequent falls. Patient states she was told she had a stroke last week but she is not sure where. Patient initially said it was [facility] and then states maybe it was at this hospital. States she's had rectal bleeding and back pain with frequent falls. She is supposed to be in court this morning. She denies fevers chills or other complaints   Past Medical History  Diagnosis Date  . Emphysema of lung ([place])   . Depression   . Diverticulitis   . Chronic bronchitis ([place])   . Malignant brain tumor ([place]) 2007  . GERD (gastroesophageal reflux disease)   . Allergy   . Hepatitis C   . Hypertension   . Chronic kidney disease   . Migraines   . Urine incontinence   . Seizures ([place])   . Stroke [facility]) 2007    during brain surgery  . Pancreatitis, alcoholic 0000000  . Rectovaginal fistula   . H/O ETOH abuse     Sober since 2009  . H/O drug abuse     Clean since 2009  . Pancreatic ascites   . Cirrhosis [facility])     Patient Active Problem List   Diagnosis Date Noted  . Sepsis ([place]) 06/22/2015  . Orthostatic hypotension 05/15/2015  . ALC (alcoholic liver cirrhosis) ([place]) 12/23/2014  . Clinical depression 12/23/2014  . Anxiety 12/23/2014  . Diabetes mellitus, type 2 ([place]) 12/23/2014  . HTN (hypertension) 11/05/2014  . Liver cirrhosis ([place]) 07/19/2014  . 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 Chronic pancreatitis ([place]) 11/03/2012  . Seizure disorder ([place]) 11/03/2012  . Benign meningioma of brain ([place]) 11/03/2012  . Chronic pelvic pain in female 11/03/2012  . Hepatitis C virus infection without hepatic coma  11/03/2012  . UTI (urinary tract infection) 11/03/2012  . Alcohol abuse, unspecified 11/03/2012  . Chronic liver disease 11/03/2012  . Schizophrenia ([place]) 11/03/2012  . Narcotic abuse 11/03/2012  . 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 Breast screening 11/03/2012  . 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 Nondependent alcohol abuse 11/03/2012  . 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 Female genital symptoms 11/03/2012    Past Surgical History  Procedure Laterality Date  . Appendectomy  1999  . Eye surgery    . Rectovaginal fistula repair w/ colostomy  2011  . Brain surgery  2007    malignant brain tumor  . Cholecystectomy  2001  . Abdominal hysterectomy  1979    Allergies Ciprofloxacin; Dilaudid; Hydromorphone; Ibuprofen; Sulfa antibiotics; Zofran; Zofran; Amoxicillin; Chocolate; Fentanyl; Metformin; Penicillins; Strawberry (diagnostic); and Strawberry extract  Social History Social History  Substance Use Topics  . Smoking status: Current Some Day Smoker -- 0.25 packs/day    Types: Cigarettes  . Smokeless tobacco: Never Used     Comment: cutting back  . Alcohol Use: No     Comment: no alcohol since 2013    Review of Systems Constitutional: Negative for fever. Eyes: Negative for visual changes. ENT: Negative for sore throat. Cardiovascular: Negative for chest pain. Respiratory: Negative for shortness of breath. Gastrointestinal: Negative for abdominal pain, vomiting and diarrhea. Positive for chronic rectal bleeding Genitourinary:  Negative for dysuria. Musculoskeletal: Positive for back pain Skin: Negative for rash. Neurological: Negative for headaches, focal weakness or numbness. Positive for frequent falls  10-point ROS otherwise negative.  ____________________________________________   PHYSICAL EXAM:  VITAL SIGNS: ED Triage Vitals  Enc Vitals Group     BP --      Pulse --      Resp  --      Temp --      Temp src --      SpO2 --      Weight --      Height --      Head Cir --      Peak Flow --      Pain Score --      Pain Loc --      Pain Edu? --      Excl. in [place]? --     Constitutional: Alert and oriented. Well appearing and in no distress. Eyes: Conjunctivae are normal. PERRL. Normal extraocular movements. ENT   Head: Normocephalic and atraumatic.   Nose: No congestion/rhinnorhea.   Mouth/Throat: Mucous membranes are moist.   Neck: No stridor. Cardiovascular: Normal rate, regular rhythm. Normal and symmetric distal pulses are present in all extremities. No murmurs, rubs, or gallops. Respiratory: Normal respiratory effort without tachypnea nor retractions. Breath sounds are clear and equal bilaterally. No wheezes/rales/rhonchi. Gastrointestinal: Soft and nontender. No distention. No abdominal bruits.  Musculoskeletal: Nontender with normal range of motion in all extremities. No joint effusions.  No lower extremity tenderness nor edema. Neurologic:  Normal speech and language. Left-sided weakness in the upper or lower extremity. Skin:  Skin is warm, dry and intact. No rash noted. Psychiatric: Mood and affect are normal. Speech and behavior are normal. Patient exhibits appropriate insight and judgment. ____________________________________________  ED COURSE:  Pertinent labs & imaging results that were available during my care of the patient were reviewed by me and considered in my medical decision making (see chart for details). Patient is well-known to me, I am unable to verify her story through the records. I will obtain CT imaging here to assess for recent CVA. CRITICAL CARE Performed by: Earleen Newport   Total critical care time: 30 minutes  Critical care time was exclusive of separately billable procedures and treating other patients.  Critical care was necessary to treat or prevent imminent or life-threatening  deterioration.  Critical care was time spent personally by me on the following activities: development of treatment plan with patient and/or surrogate as well as nursing, discussions with consultants, evaluation of patient's response to treatment, examination of patient, obtaining history from patient or surrogate, ordering and performing treatments and interventions, ordering and review of laboratory studies, ordering and review of radiographic studies, pulse oximetry and re-evaluation of patient's condition.   RADIOLOGY Images were viewed by me  CT head IMPRESSION: 1. Large nearly isodense right hemispheric subdural hematoma with resulting midline shift and entrapment of the left lateral ventricle. 2. Underlying changes from previous left frontotemporal craniotomy. 3. Critical Value/emergent results were called by telephone at the time of interpretation on 08/16/2015 at 11:50 am to Dr. Lenise Arena , who verbally acknowledged these results. ____________________________________________  FINAL ASSESSMENT AND PLAN  Frequent falls, subdural hematoma  Plan: Patient with labs and imaging as dictated above. Patient with subdural hematoma, I'm unsure how long its been there. Will discuss with neurosurgery, and likely transfer  I discussed with the neurosurgeon on-call at [facility] who has accepted the patient in transfer.  Earleen Newport, MD   Earleen Newport, MD 08/16/15 1226

## 2015-08-16 NOTE — ED Notes (Signed)
Pt resting in bed quietly in no acute distress

## 2015-08-16 NOTE — Op Note (Signed)
08/16/2015  8:34 PM  PATIENT:  [name]  60 y.o. female presents with a large right panhemispheric subdural hematoma causing mass effect. On exam she is quite weak on the left side. I have recommended she undergo a craniotomy to decompress the cerebrum.  PRE-OPERATIVE DIAGNOSIS:  Right panhemispheric Subdural Hematoma  POST-OPERATIVE DIAGNOSIS:  Right panhemispheric Subdural Hematoma  PROCEDURE:  Procedure(s): Right Fronto-Temporal-Parietal Craniotomy for Evacuation of Hematoma with Bone Flap Placement in Abdomen  SURGEON: Surgeon(s): Ashok Pall, MD  ASSISTANTS:none  ANESTHESIA:   general  EBL:  Total I/O In: 250 [IV Piggyback:250] Out: 160 [Urine:60; Blood:100]  BLOOD ADMINISTERED:250 CC PRBC  CELL SAVER GIVEN:no  COUNT:per nursing  DRAINS: none   SPECIMEN:  No Specimen  DICTATION: [name] was taken to the operating room, intubated, and placed under a general anesthetic without difficulty. female She had her head positioned on a doughnut supine. Her head was shaved, prepped, and then draped in a sterile manner. I made a reverse question mark incision on the right side with a 10 blade. I raised a scalp flap with the divided temporalis muscle with cautery and periosteal elevators, to expose the frontal, temporal, and parietal bones. I created burr holes in the pterion, temporal, parietal, and frontal bones, and connected them to turn the craniotomy flap with the craniotome. The dura was torn with the craniotome and a large gush of blood came out when I removed the bone flap. The brain was slightly tense, with some membranes present. I did not strip the membranes overlying the brain. I irrigated the subdural space and gently depressed the brain to try and reach all of the clot.  Once I was satisfied with the hematoma evacuation I approximated the dura. However the brain was still quite full, therefore I opted to place the bone flap in the abdominal wall. I  approximated the temporalis fascia. I then closed the galea with vicryl sutures. I approximated the scalp with staples.  The drapes were removed and the abdomen prepped. We draped the abdomen and were now ready for the bone flap placement. I made a linear incision in the right upper quadrant with a 10 blade. I used the cautery to tp extend the dissection into the subcutaneous adipose, of which there was a thick layer. I dissected with cautery rostrally and caudally until I created enough space for the bone flap. I placed it in the pocket. I closed the wound with staples only due to the generous adipose.  She was extubated and taken to the Icu for recovery.   PLAN OF CARE: Admit to inpatient   PATIENT DISPOSITION:  PACU - hemodynamically stable.   Delay start of Pharmacological VTE agent (>24hrs) due to surgical blood loss or risk of bleeding:  yes

## 2015-08-16 NOTE — Anesthesia Preprocedure Evaluation (Addendum)
Anesthesia Evaluation  Patient identified by MRN, date of birth, ID band Patient awake    Reviewed: Allergy & Precautions, NPO status , Patient's Chart, lab work & pertinent test results  History of Anesthesia Complications Negative for: history of anesthetic complications  Airway Mallampati: II  TM Distance: >3 FB Neck ROM: Full    Dental  (+) Edentulous Upper, Edentulous Lower, Dental Advisory Given   Pulmonary COPD,  COPD inhaler, Current Smoker,    breath sounds clear to auscultation       Cardiovascular hypertension, Pt. on medications and Pt. on home beta blockers  Rhythm:Regular     Neuro/Psych  Headaches, Seizures -, Poorly Controlled,  PSYCHIATRIC DISORDERS Anxiety Depression Schizophrenia Left sided weakness CVA, Residual Symptoms    GI/Hepatic GERD  Medicated,(+) Cirrhosis     substance abuse  alcohol use, Hepatitis -, C  Endo/Other  Morbid obesity  Renal/GU      Musculoskeletal   Abdominal   Peds  Hematology   Anesthesia Other Findings   Reproductive/Obstetrics                           Anesthesia Physical Anesthesia Plan  ASA: III and emergent  Anesthesia Plan: General   Post-op Pain Management:    Induction: Intravenous  Airway Management Planned: Oral ETT  Additional Equipment: Arterial line, CVP and Ultrasound Guidance Line Placement  Intra-op Plan:   Post-operative Plan: Extubation in OR and Possible Post-op intubation/ventilation  Informed Consent: I have reviewed the patients History and Physical, chart, labs and discussed the procedure including the risks, benefits and alternatives for the proposed anesthesia with the patient or authorized representative who has indicated his/her understanding and acceptance.   Dental advisory given  Plan Discussed with: CRNA, Anesthesiologist and Surgeon  Anesthesia Plan Comments:        Anesthesia Quick  Evaluation

## 2015-08-16 NOTE — Transfer of Care (Signed)
Immediate Anesthesia Transfer of Care Note  Patient: [name]  Procedure(s) Performed: Procedure(s): Right Fronto-Temporal-Parietal Craniotomy for Evacuation of Hematoma with Bone Flap Placement in Abdomen (Right)  Patient Location: SICU  Anesthesia Type:General  Level of Consciousness: awake, alert  and patient cooperative  Airway & Oxygen Therapy: Patient Spontanous Breathing and Patient connected to face mask oxygen  Post-op Assessment: Report given to RN, Post -op Vital signs reviewed and stable and Patient moving all extremities  Post vital signs: Reviewed and stable  Last Vitals:  Filed Vitals:   08/16/15 1600 08/16/15 1615  BP: 160/118   Pulse: 89 86  Resp: 16 18    Complications: No apparent anesthesia complications

## 2015-08-16 NOTE — H&P (Signed)
[name] is an 60 y.o. female.   Chief Complaint: subdural hematoma, multiple falls HPI: Subdural hematoma on right side panhemispheric. With weakness on left side.  Past Medical History  Diagnosis Date  . Emphysema of lung ([place])   . Depression   . Diverticulitis   . Chronic bronchitis ([place])   . Malignant brain tumor ([place]) 2007  . GERD (gastroesophageal reflux disease)   . Allergy   . Hepatitis C   . Hypertension   . Chronic kidney disease   . Migraines   . Urine incontinence   . Seizures ([place])   . Stroke [facility]) 2007    during brain surgery  . Pancreatitis, alcoholic 1950  . Rectovaginal fistula   . H/O ETOH abuse     Sober since 2009  . H/O drug abuse     Clean since 2009  . Pancreatic ascites   . Cirrhosis [facility])     Past Surgical History  Procedure Laterality Date  . Appendectomy  1999  . Eye surgery    . Rectovaginal fistula repair w/ colostomy  2011  . Brain surgery  2007    malignant brain tumor  . Cholecystectomy  2001  . Abdominal hysterectomy  1979    Family History  Problem Relation Age of Onset  . Hypertension Mother   . Heart disease Father   . Diabetes Father   . Cancer Sister     brain  . Cancer Grandchild 8    brain tumor   Social History:  reports that she has been smoking Cigarettes.  She has been smoking about 0.25 packs per day. She has never used smokeless tobacco. She reports that she does not drink alcohol or use illicit drugs.  Allergies:  Allergies  Allergen Reactions  . Ciprofloxacin Itching  . Dilaudid [Hydromorphone Hcl] Itching  . Hydromorphone Itching  . Ibuprofen Other (See Comments)    Other reaction(s): Other (See Comments) Cannot take because of liver disease Cannot take because of liver disease   . Sulfa Antibiotics Hives  . Zofran [Ondansetron Hcl] Itching    Other reaction(s): Other (See Comments) Do not use; vomiting per pt  . Zofran [Ondansetron] Other (See Comments)    Do not use; vomiting per pt  .  Amoxicillin Rash  . Chocolate Rash  . Fentanyl Rash  . Metformin Nausea Only  . Penicillins Rash and Other (See Comments)    Has patient had a PCN reaction causing immediate rash, facial/tongue/throat swelling, SOB or lightheadedness with hypotension: [id number]} Has patient had a PCN reaction causing severe rash involving mucus membranes or skin necrosis: [id number]} Has patient had a PCN reaction that required hospitalization [id number]} Has patient had a PCN reaction occurring within the last 10 years: [id number]} If all of the above answers are "NO", then may proceed with Cephalosporin use.   . Strawberry (Diagnostic) Rash  . Strawberry Extract Rash    Medications Prior to Admission  Medication Sig Dispense Refill  . ADVAIR DISKUS 500-50 MCG/DOSE AEPB inhale 1 dose by mouth twice a day  0  . albuterol (PROAIR HFA) 108 (90 BASE) MCG/ACT inhaler Inhale 2 puffs into the lungs every 4 (four) hours as needed.     Marland Kitchen amitriptyline (ELAVIL) 50 MG tablet Take 1 tablet (50 mg total) by mouth at bedtime. 30 tablet 3  . Bismuth Subsalicylate (KAOPECTATE EXTRA STRENGTH) 525 MG/15ML SUSP Take 15 mLs (525 mg total) by mouth every 2 (two) hours as needed. 420 mL 0  . cefUROXime (  CEFTIN) 500 MG tablet Take 1 tablet (500 mg total) by mouth 2 (two) times daily with a meal. 14 tablet 0  . CREON 12000 UNITS CPEP Take 24,000 Units by mouth 3 (three) times daily before meals.     . fluconazole (DIFLUCAN) 150 MG tablet Take 1 tablet by mouth weekly 24 tablet 0  . furosemide (LASIX) 20 MG tablet Take 20 mg by mouth daily.    Marland Kitchen gabapentin (NEURONTIN) 300 MG capsule Take 300 mg by mouth 3 (three) times daily.     . hydrocortisone cream 0.5 % Apply 1 application topically 2 (two) times daily as needed for itching. 30 g 0  . Ipratropium-Albuterol (COMBIVENT) 20-100 MCG/ACT AERS respimat Inhale into the lungs.    . lactulose (CHRONULAC) 10 GM/15ML solution Take 20 g by mouth daily as needed for mild  constipation.     . levETIRAcetam (KEPPRA) 500 MG tablet Take 1 tablet (500 mg total) by mouth 2 (two) times daily. 60 tablet 3  . meloxicam (MOBIC) 15 MG tablet Take 1 tablet (15 mg total) by mouth 2 (two) times daily as needed for pain. 60 tablet 3  . metoprolol succinate (TOPROL-XL) 25 MG 24 hr tablet Take 1 tablet (25 mg total) by mouth daily. 30 tablet 4  . metroNIDAZOLE (FLAGYL) 500 MG tablet Take 1 tablet (500 mg total) by mouth 3 (three) times daily. 21 tablet 0  . omeprazole (PRILOSEC) 20 MG capsule Take 1 capsule (20 mg total) by mouth daily. 30 capsule 11  . Oxycodone HCl 20 MG TABS Take 1 tablet by mouth every 8 (eight) hours as needed.  0  . pravastatin (PRAVACHOL) 10 MG tablet Take 10 mg by mouth daily.     . sertraline (ZOLOFT) 50 MG tablet Take 50 mg by mouth daily.    Marland Kitchen tiotropium (SPIRIVA) 18 MCG inhalation capsule Place 18 mcg into inhaler and inhale 2 (two) times daily as needed (shortness of breath).     . triamcinolone (NASACORT ALLERGY 24HR) 55 MCG/ACT AERO nasal inhaler Place 2 sprays into the nose daily as needed (allergies).    . zolpidem (AMBIEN) 10 MG tablet Take 1 tablet (10 mg total) by mouth at bedtime. 30 tablet 3    Results for orders placed or performed during the hospital encounter of 08/16/15 (from the past 48 hour(s))  Glucose, capillary     Status: None   Collection Time: 08/16/15 12:10 PM  Result Value Ref Range   Glucose-Capillary 94 65 - 99 mg/dL   Comment 1 Notify RN   Comprehensive metabolic panel     Status: Abnormal   Collection Time: 08/16/15 12:43 PM  Result Value Ref Range   Sodium 140 135 - 145 mmol/L   Potassium 3.8 3.5 - 5.1 mmol/L   Chloride 111 101 - 111 mmol/L   CO2 21 (L) 22 - 32 mmol/L   Glucose, Bld 99 65 - 99 mg/dL   BUN 6 6 - 20 mg/dL   Creatinine, Ser 0.74 0.44 - 1.00 mg/dL   Calcium 9.0 8.9 - 10.3 mg/dL   Total Protein 7.6 6.5 - 8.1 g/dL   Albumin 3.5 3.5 - 5.0 g/dL   AST 60 (H) 15 - 41 U/L   ALT 30 14 - 54 U/L   Alkaline  Phosphatase 162 (H) 38 - 126 U/L   Total Bilirubin 1.0 0.3 - 1.2 mg/dL   GFR calc non Af Amer >60 >60 mL/min   GFR calc Af Amer >60 >60 mL/min  Comment: (NOTE) The eGFR has been calculated using the CKD EPI equation. This calculation has not been validated in all clinical situations. eGFR's persistently <60 mL/min signify possible Chronic Kidney Disease.    Anion gap 8 5 - 15  Troponin I     Status: None   Collection Time: 08/16/15 12:43 PM  Result Value Ref Range   Troponin I <0.03 <0.031 ng/mL    Comment:        NO INDICATION OF MYOCARDIAL INJURY.   Urinalysis complete, with microscopic     Status: Abnormal   Collection Time: 08/16/15 12:54 PM  Result Value Ref Range   Color, Urine AMBER (A) YELLOW   APPearance CLOUDY (A) CLEAR   Glucose, UA NEGATIVE NEGATIVE mg/dL   Bilirubin Urine NEGATIVE NEGATIVE   Ketones, ur NEGATIVE NEGATIVE mg/dL   Specific Gravity, Urine 1.012 1.005 - 1.030   Hgb urine dipstick 3+ (A) NEGATIVE   pH 6.0 5.0 - 8.0   Protein, ur NEGATIVE NEGATIVE mg/dL   Nitrite POSITIVE (A) NEGATIVE   Leukocytes, UA 3+ (A) NEGATIVE   RBC / HPF TOO NUMEROUS TO COUNT 0 - 5 RBC/hpf   WBC, UA 6-30 0 - 5 WBC/hpf   Bacteria, UA MANY (A) NONE SEEN   Squamous Epithelial / LPF 6-30 (A) NONE SEEN   Trans Epithel, UA <1    Mucous PRESENT    Budding Yeast PRESENT    Ct Head Wo Contrast  08/16/2015  CLINICAL DATA:  Recurrent falling over the last week. History of malignant brain tumor with surgery in 2007. History of stroke and seizures. EXAM: CT HEAD WITHOUT CONTRAST TECHNIQUE: Contiguous axial images were obtained from the base of the skull through the vertex without intravenous contrast. COMPARISON:  Head CT 08/27/2013. FINDINGS: There is a large nearly isodense right-sided subdural hematoma. This measures up to 1.8 cm in thickness. There are some higher density components consistent with recent bleeding. There may be a small amount of subdural blood layering of the  tentorium, asymmetric to the left. There is resulting right-to-left midline shift by 15 mm. There is new asymmetric dilatation of the left lateral ventricle which is likely entrapped. The subarachnoid spaces are largely effaced. Chronic encephalomalacia is present within the left temporal lobe status post left frontal temporal craniotomy and reported tumor resection. No evidence of intraparenchymal hemorrhage. The visualized paranasal sinuses are clear. The calvarium demonstrates no acute findings. IMPRESSION: 1. Large nearly isodense right hemispheric subdural hematoma with resulting midline shift and entrapment of the left lateral ventricle. 2. Underlying changes from previous left frontotemporal craniotomy. 3. Critical Value/emergent results were called by telephone at the time of interpretation on 08/16/2015 at 11:50 am to Dr. Lenise Arena , who verbally acknowledged these results. Electronically Signed   By: Richardean Sale M.D.   On: 08/16/2015 11:56    Review of Systems  Constitutional: Positive for malaise/fatigue.  Eyes: Negative.   Respiratory: Negative.   Cardiovascular: Negative.   Gastrointestinal: Negative.   Genitourinary: Negative.   Musculoskeletal: Positive for falls.  Skin: Negative.   Neurological: Positive for speech change, focal weakness, weakness and headaches.  Psychiatric/Behavioral: Positive for depression. The patient is nervous/anxious.     There were no vitals taken for this visit. Physical Exam  Constitutional: She is oriented to person, place, and time. She appears well-developed and well-nourished. She appears distressed.  HENT:  Right Ear: External ear normal.  Left Ear: External ear normal.  Nose: Nose normal.  Mouth/Throat: Oropharynx is clear and  moist.  Well healed incision left frontal region  Eyes: Conjunctivae and EOM are normal. Pupils are equal, round, and reactive to light.  Neck: Normal range of motion. Neck supple.  Cardiovascular: Normal  rate, regular rhythm and normal heart sounds.   Respiratory: Effort normal.  GI: Soft. Bowel sounds are normal.  Neurological: She is alert and oriented to person, place, and time. No cranial nerve deficit. GCS eye subscore is 4. GCS verbal subscore is 5. GCS motor subscore is 6. She displays no Babinski's sign on the right side. She displays no Babinski's sign on the left side.  Plegic on left side, upper extremities slightly stronger than left lower extremities. Strength graded at 4/5 perrl full eom. Symmetric facies, tongue and uvula midline     Assessment/Plan OR for subdural hematoma evacuation. Risks including bleeding, infection, stroke, coma, death, paralysis, subdural recurrence, need for further surgery, seizures, and other risks were discussed. She understands and wishes to proceed.   Cheryln Balcom L, MD 08/16/2015, 3:49 PM

## 2015-08-16 NOTE — Anesthesia Procedure Notes (Addendum)
Procedure Name: Intubation Date/Time: 08/16/2015 5:35 PM Performed by: Garrison Columbus T Pre-anesthesia Checklist: Patient identified, Emergency Drugs available, Suction available and Patient being monitored Patient Re-evaluated:Patient Re-evaluated prior to inductionOxygen Delivery Method: Circle system utilized Preoxygenation: Pre-oxygenation with 100% oxygen Intubation Type: IV induction Ventilation: Mask ventilation without difficulty and Oral airway inserted - appropriate to patient size Laryngoscope Size: Sabra Heck and 2 Grade View: Grade I Tube type: Subglottic suction tube Tube size: 7.5 mm Number of attempts: 1 Airway Equipment and Method: Stylet and Oral airway Placement Confirmation: ETT inserted through vocal cords under direct vision,  positive ETCO2 and breath sounds checked- equal and bilateral Secured at: 22 cm Tube secured with: Tape Dental Injury: Teeth and Oropharynx as per pre-operative assessment     Central Venous Catheter Insertion Performed by: anesthesiologist Patient location: Pre-op. Preanesthetic checklist: patient identified, IV checked, site marked, risks and benefits discussed, surgical consent, monitors and equipment checked, pre-op evaluation, timeout performed and anesthesia consent Position: Trendelenburg Lidocaine 1% used for infiltration Landmarks identified and Seldinger technique used Catheter size: 8 Fr Central line was placed.Double lumen Procedure performed using ultrasound guided technique. Attempts: 1 Following insertion, dressing applied, line sutured and Biopatch. Post procedure assessment: blood return through all ports, free fluid flow and no air. Patient tolerated the procedure well with no immediate complications.

## 2015-08-16 NOTE — ED Notes (Signed)
Spoke with Trasha Rubert  - pt's daughter  [phone]

## 2015-08-17 ENCOUNTER — Encounter (HOSPITAL_COMMUNITY): Payer: Self-pay | Admitting: Neurosurgery

## 2015-08-17 LAB — MRSA PCR SCREENING: MRSA by PCR: POSITIVE — AB

## 2015-08-17 MED ORDER — MUPIROCIN 2 % EX OINT
1.0000 "application " | TOPICAL_OINTMENT | Freq: Two times a day (BID) | CUTANEOUS | Status: AC
  Administered 2015-08-17 – 2015-08-22 (×10): 1 via NASAL
  Filled 2015-08-17 (×3): qty 22

## 2015-08-17 MED ORDER — OXYCODONE-ACETAMINOPHEN 5-325 MG PO TABS
1.0000 | ORAL_TABLET | ORAL | Status: DC | PRN
  Administered 2015-08-17 – 2015-08-18 (×2): 1 via ORAL
  Filled 2015-08-17 (×2): qty 1

## 2015-08-17 MED ORDER — PROMETHAZINE HCL 25 MG/ML IJ SOLN
12.5000 mg | Freq: Four times a day (QID) | INTRAMUSCULAR | Status: DC | PRN
  Administered 2015-08-18: 25 mg via INTRAVENOUS
  Filled 2015-08-17: qty 1

## 2015-08-17 MED ORDER — CHLORHEXIDINE GLUCONATE CLOTH 2 % EX PADS
6.0000 | MEDICATED_PAD | Freq: Every day | CUTANEOUS | Status: AC
  Administered 2015-08-18 – 2015-08-22 (×3): 6 via TOPICAL

## 2015-08-17 NOTE — Progress Notes (Signed)
Patient ID: [name], female   DOB: 1956/01/07, 60 y.o.   MRN: [id number] BP 103/60 mmHg  Pulse 102  Temp(Src) 99.9 F (37.7 C) (Oral)  Resp 32  Ht 5\' 8"  (1.727 m)  Wt 118 kg (260 lb 2.3 oz)  BMI 39.56 kg/m2  SpO2 94% Alert following commands Moving extremities Left weaker than right Periorbital edema right eye as expected Improving slowly

## 2015-08-17 NOTE — Progress Notes (Signed)
D; Pt c/o abdominal pain 10/10, not effective pain meds  Given @ 2050. Notified MD, New order received. Abdomen soft no s/s of hemorrhage noted.

## 2015-08-18 MED ORDER — OXYCODONE HCL 5 MG PO TABS
5.0000 mg | ORAL_TABLET | ORAL | Status: DC | PRN
  Administered 2015-08-19 – 2015-08-30 (×27): 5 mg via ORAL
  Filled 2015-08-18 (×27): qty 1

## 2015-08-18 MED ORDER — LORAZEPAM 2 MG/ML IJ SOLN
INTRAMUSCULAR | Status: AC
  Filled 2015-08-18: qty 1

## 2015-08-18 MED ORDER — PANTOPRAZOLE SODIUM 40 MG PO TBEC
40.0000 mg | DELAYED_RELEASE_TABLET | Freq: Every day | ORAL | Status: DC
  Administered 2015-08-18 – 2015-08-29 (×12): 40 mg via ORAL
  Filled 2015-08-18 (×12): qty 1

## 2015-08-18 MED ORDER — OXYCODONE HCL ER 10 MG PO T12A
20.0000 mg | EXTENDED_RELEASE_TABLET | Freq: Two times a day (BID) | ORAL | Status: DC
  Administered 2015-08-19 – 2015-08-30 (×24): 20 mg via ORAL
  Filled 2015-08-18 (×2): qty 2
  Filled 2015-08-18: qty 1
  Filled 2015-08-18 (×11): qty 2
  Filled 2015-08-18: qty 1
  Filled 2015-08-18 (×2): qty 2
  Filled 2015-08-18: qty 1
  Filled 2015-08-18 (×3): qty 2
  Filled 2015-08-18: qty 1
  Filled 2015-08-18 (×2): qty 2

## 2015-08-18 MED ORDER — LEVETIRACETAM 750 MG PO TABS
750.0000 mg | ORAL_TABLET | Freq: Two times a day (BID) | ORAL | Status: DC
  Administered 2015-08-18 – 2015-08-30 (×24): 750 mg via ORAL
  Filled 2015-08-18 (×24): qty 1

## 2015-08-18 MED ORDER — IPRATROPIUM-ALBUTEROL 0.5-2.5 (3) MG/3ML IN SOLN
3.0000 mL | Freq: Two times a day (BID) | RESPIRATORY_TRACT | Status: DC
  Administered 2015-08-18 – 2015-08-21 (×6): 3 mL via RESPIRATORY_TRACT
  Filled 2015-08-18 (×6): qty 3

## 2015-08-18 MED ORDER — LEVETIRACETAM 500 MG PO TABS
500.0000 mg | ORAL_TABLET | Freq: Once | ORAL | Status: AC
  Administered 2015-08-18: 500 mg via ORAL
  Filled 2015-08-18: qty 1

## 2015-08-18 MED ORDER — OXYCODONE HCL 5 MG PO TABS
20.0000 mg | ORAL_TABLET | Freq: Three times a day (TID) | ORAL | Status: DC | PRN
  Administered 2015-08-18: 20 mg via ORAL
  Filled 2015-08-18: qty 4

## 2015-08-18 NOTE — Progress Notes (Signed)
Went into patient room at 0800 to assess.  Patient was oriented x4 and having normal conversation.  She began to display seizure activity with her eyes diverted to the left and no longer interative.  The siezure lasted roughly 90 seconds.  Dr. Christella Noa at [phone] and was told to hold Ativan if no longer seizing but to give 500mg  Keppra now and 750 mg Keppra BID. Will continue to monitor for further activity. [place], [place]

## 2015-08-18 NOTE — Progress Notes (Signed)
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  She was on a current course of therapy of Vancomycin prior to this admission and did not complete therapy.  "I probably still have a UTI now." Pt had a foley for operative procedure but, according to patient, UTI was present prior to admission and being treated. Kameko Hukill C 3:49 PM

## 2015-08-18 NOTE — Anesthesia Postprocedure Evaluation (Addendum)
Anesthesia Post Note  Patient: Rashada Gerner  Procedure(s) Performed: Procedure(s) (LRB): Right Fronto-Temporal-Parietal Craniotomy for Evacuation of Hematoma with Bone Flap Placement in Abdomen (Right)  Patient location during evaluation: ICU Anesthesia Type: General Level of consciousness: awake and alert and patient cooperative Pain management: pain level controlled Vital Signs Assessment: post-procedure vital signs reviewed and stable Respiratory status: spontaneous breathing and respiratory function stable Cardiovascular status: stable Anesthetic complications: no    Last Vitals:  Filed Vitals:   08/18/15 0600 08/18/15 0700  BP: 106/66   Pulse: 94 95  Temp:    Resp: 32 29    Last Pain:  Filed Vitals:   08/18/15 0732  PainSc: Parker S

## 2015-08-18 NOTE — Progress Notes (Signed)
Patient ID: [name], female   DOB: 10-May-1956, 60 y.o.   MRN: [id number] BP 110/68 mmHg  Pulse 90  Temp(Src) 98 F (36.7 C) (Axillary)  Resp 26  Ht 5\' 8"  (1.727 m)  Wt 118 kg (260 lb 2.3 oz)  BMI 39.56 kg/m2  SpO2 92% Alert and oriented at this time. Earlier this am patient witnessed seizing. I increased the Keppra dose Following all commands at this time Moving all extremities, slightly weaker on the left Dressing dry, intact Mobilizing slowly.

## 2015-08-18 NOTE — Care Management Note (Signed)
Case Management Note  Patient Details  Name: [name] MRN: [id number] Date of Birth: [birth year]  Subjective/Objective:    Pt admitted on 08/16/15 with SDH requiring craniotomy.  PTA, pt resided at home with spouse.               Action/Plan: Met with pt to discuss dc plans.  Pt states she will likely dc home with her mother when discharged.  Mom at bedside states she will be able to care for pt. Will follow progress.  Recommend PT/OT consults when able to tolerate.    Expected Discharge Date:  08/21/15               Expected Discharge Plan:  [place]  In-House Referral:     Discharge planning Services  CM Consult  Post Acute Care Choice:    Choice offered to:     DME Arranged:    DME Agency:     HH Arranged:    HH Agency:     Status of Service:  In process, will continue to follow  Medicare Important Message Given:    Date Medicare IM Given:    Medicare IM give by:    Date Additional Medicare IM Given:    Additional Medicare Important Message give by:     If discussed at [place] of Stay Meetings, dates discussed:    Additional Comments:  Reinaldo Raddle, RN, BSN  Trauma/Neuro ICU Case Manager [phone]

## 2015-08-19 MED ORDER — CETYLPYRIDINIUM CHLORIDE 0.05 % MT LIQD
7.0000 mL | Freq: Two times a day (BID) | OROMUCOSAL | Status: DC
  Administered 2015-08-19 – 2015-08-30 (×19): 7 mL via OROMUCOSAL

## 2015-08-19 NOTE — Progress Notes (Signed)
Postop day 3. Patient sitting up eating breakfast. Headache well controlled. Complains of some incisional pain. Still with quite a bit of swelling overlying her right eye.  She is afebrile. Her vitals are stable. She is awake and alert. She is oriented and reasonably appropriate. Still has a little bit of left-sided weakness but this appears to be much improved from preop. There is a small amount of serous drainage overlying her craniotomy site. This is not actively leaking.  Overall doing well following craniotomy for subdural hematoma. Continue ICU observation. Continue efforts at mobilization.

## 2015-08-19 NOTE — Evaluation (Signed)
Physical Therapy Evaluation Patient Details Name: [name] MRN: [id number] DOB: 03/01/1956 Today's Date: 08/19/2015   History of Present Illness  pt presents after R Frontal-Temporal-Parietal Crani for Hematoma evacuation with bone flap in R Abdomen and Seizures.  pt with hx of Emphysema, Depression, Malignant Brain Tumor, Hep C, CKD, CVA, Seizures, Pancreatitis, Cirrhosis, Ascites, Rectovaginal Fistula, and hx Polysubstance.    Clinical Impression  Pt indicates fatigue and headache limiting mobility this am.  At this time pt only requiring MinA for transfers and feel she will be able to progress to return to home with family and her Aide.  Will need to discuss need for helmet prior to return to home.  Will continue to follow.      Follow Up Recommendations Home health PT;Supervision/Assistance - 24 hour    Equipment Recommendations  None recommended by PT    Recommendations for Other Services       Precautions / Restrictions Precautions Precautions: Fall Precaution Comments: Bone flap in R abdomen. Restrictions Weight Bearing Restrictions: No      Mobility  Bed Mobility               General bed mobility comments: Sitting EOB with Nsg.  Transfers Overall transfer level: Needs assistance Equipment used: Rolling walker (2 wheeled) Transfers: Sit to/from [facility] Sit to Stand: Min guard Stand pivot transfers: Min assist       General transfer comment: pt moves slowly and with definite use of UEs.  pt indicates fatigue with mobility.    Ambulation/Gait                Stairs            Wheelchair Mobility    Modified Rankin (Stroke Patients Only)       Balance Overall balance assessment: Needs assistance Sitting-balance support: Single extremity supported;Feet supported Sitting balance-Leahy Scale: Fair     Standing balance support: Bilateral upper extremity supported;During functional activity Standing  balance-Leahy Scale: Fair                               Pertinent Vitals/Pain Pain Assessment: 0-10 Pain Score: 6  Pain Location: Headache Pain Descriptors / Indicators: Headache Pain Intervention(s): Monitored during session;Repositioned;RN gave pain meds during session    Home Living Family/patient expects to be discharged to:: Private residence Living Arrangements: Spouse/significant other Available Help at Discharge: Family;Personal care attendant;Available 24 hours/day Type of Home: House Home Access: Stairs to enter   [employer] of Steps: 5 Home Layout: One level Home Equipment: Walker - 4 wheels;Shower seat Additional Comments: pt has aide 7 dyas per week for a "few hours" each day.    Prior Function Level of Independence: Needs assistance   Gait / Transfers Assistance Needed: Uses RW  ADL's / Homemaking Assistance Needed: Aide A with ADLs and homemaking tasks.        Hand Dominance        Extremity/Trunk Assessment   Upper Extremity Assessment: Defer to OT evaluation           Lower Extremity Assessment: Generalized weakness      Cervical / Trunk Assessment: Normal  Communication   Communication: No difficulties (Speaks quietly)  Cognition Arousal/Alertness: Awake/alert Behavior During Therapy: WFL for tasks assessed/performed Overall Cognitive Status: Within Functional Limits for tasks assessed  General Comments      Exercises        Assessment/Plan    PT Assessment Patient needs continued PT services  PT Diagnosis Difficulty walking;Acute pain;Generalized weakness   PT Problem List Decreased strength;Decreased activity tolerance;Decreased balance;Decreased mobility;Decreased coordination;Decreased knowledge of use of DME;Pain;Obesity  PT Treatment Interventions DME instruction;Gait training;Stair training;Functional mobility training;Therapeutic activities;Therapeutic exercise;Balance  training;Neuromuscular re-education;Patient/family education   PT Goals (Current goals can be found in the Care Plan section) Acute Rehab PT Goals Patient Stated Goal: Go home. PT Goal Formulation: With patient Time For Goal Achievement: 09/02/15 Potential to Achieve Goals: Good    Frequency Min 3X/week   Barriers to discharge        Co-evaluation               End of Session Equipment Utilized During Treatment: Gait belt Activity Tolerance: Patient limited by fatigue;Patient limited by pain Patient left: in chair;with call bell/phone within reach;with chair alarm set Nurse Communication: Mobility status         Time: [id number] PT Time Calculation (min) (ACUTE ONLY): 24 min   Charges:   PT Evaluation $PT Eval Moderate Complexity: 1 Procedure PT Treatments $Therapeutic Activity: 8-22 mins   PT G CodesCatarina Hartshorn, [place] [phone] 08/19/2015, 11:47 AM

## 2015-08-19 NOTE — Progress Notes (Signed)
PT Cancellation Note  Patient Details Name: [name] MRN: [id number] DOB: [birth year]   Cancelled Treatment:    Reason Eval/Treat Not Completed: Patient not medically ready.  Pt currently on bedrest.  Please advance activity order once appropriate for PT and mobility.     Catarina Hartshorn, [place] 08/19/2015, 7:13 AM

## 2015-08-20 ENCOUNTER — Inpatient Hospital Stay (HOSPITAL_COMMUNITY): Payer: Medicare Other

## 2015-08-20 LAB — TYPE AND SCREEN
ABO/RH(D): A POS
ANTIBODY SCREEN: POSITIVE
DAT, IgG: NEGATIVE
DONOR AG TYPE: NEGATIVE
DONOR AG TYPE: NEGATIVE
UNIT DIVISION: 0
UNIT DIVISION: 0
Unit division: 0

## 2015-08-20 LAB — GLUCOSE, CAPILLARY: Glucose-Capillary: 158 mg/dL — ABNORMAL HIGH (ref 65–99)

## 2015-08-20 MED ORDER — SODIUM CHLORIDE 0.9% FLUSH
10.0000 mL | INTRAVENOUS | Status: DC | PRN
  Administered 2015-08-28: 10 mL
  Filled 2015-08-20: qty 40

## 2015-08-20 MED ORDER — SODIUM CHLORIDE 0.9% FLUSH
10.0000 mL | Freq: Two times a day (BID) | INTRAVENOUS | Status: DC
  Administered 2015-08-20: 10 mL
  Administered 2015-08-21: 20 mL
  Administered 2015-08-24 – 2015-08-30 (×8): 10 mL

## 2015-08-20 NOTE — Progress Notes (Signed)
Patient ID: [name], female   DOB: 09-15-1955, 60 y.o.   MRN: [id number] Overall patient doing well. No headache.  Afebrile. Vitals are stable. Awake and alert. Oriented and appropriate. Motor and sensory function intact. Wound clean and dry.  Doing well following craniotomy for subdural. Transfer to floor and continue efforts at mobilization.

## 2015-08-20 NOTE — Progress Notes (Signed)
Charting error - unable to remove and change time to 08/19/15

## 2015-08-21 MED ORDER — IPRATROPIUM-ALBUTEROL 0.5-2.5 (3) MG/3ML IN SOLN: 3.0000 mL | Freq: Four times a day (QID) | RESPIRATORY_TRACT | Status: DC | PRN

## 2015-08-21 NOTE — Progress Notes (Signed)
Physical Therapy Treatment Patient Details Name: [name] MRN: [id number] DOB: [birth year] Today's Date: 08/21/2015    History of Present Illness pt presents after R Frontal-Temporal-Parietal Crani for Hematoma evacuation with bone flap in R Abdomen and Seizures.  pt with hx of Emphysema, Depression, Malignant Brain Tumor, Hep C, CKD, CVA, Seizures, Pancreatitis, Cirrhosis, Ascites, Rectovaginal Fistula, and hx Polysubstance.      PT Comments    Pt progressing towards physical therapy goals. Session limited today due to sudden nosebleed while sitting on BSC. Large clots noted to be on washcloth as pt blew nose. RN notified. Pt did well with mobilization around room with HHA for support. RW may still be appropriate for longer distance ambulation. Will continue to follow and progress as able per POC.   Follow Up Recommendations  Home health PT;Supervision/Assistance - 24 hour     Equipment Recommendations  None recommended by PT    Recommendations for Other Services       Precautions / Restrictions Precautions Precautions: Fall Precaution Comments: Bone flap in R abdomen. Restrictions Weight Bearing Restrictions: No    Mobility  Bed Mobility               General bed mobility comments: Pt sitting up in recliner upon PT arrival.   Transfers Overall transfer level: Needs assistance Equipment used: 1 person hand held assist Transfers: Sit to/from Stand Sit to Stand: Min guard;Min assist         General transfer comment: Close guard for safety as pt powers up to full standing position. Once standing, HHA provided for steadying assist.   Ambulation/Gait Ambulation/Gait assistance: Min assist Ambulation Distance (Feet): 15 Feet Assistive device: 1 person hand held assist Gait Pattern/deviations: Step-through pattern;Decreased stride length;Trunk flexed Gait velocity: Decreased Gait velocity interpretation: Below normal speed for age/gender General Gait  Details: Pt ambulated around room with HHA for balance support. Pt with occasional staggering to the side but able to recover with very minimal assistance.    Stairs            Wheelchair Mobility    Modified Rankin (Stroke Patients Only)       Balance Overall balance assessment: Needs assistance Sitting-balance support: Feet supported;No upper extremity supported Sitting balance-Leahy Scale: Fair     Standing balance support: No upper extremity supported;During functional activity Standing balance-Leahy Scale: Fair Standing balance comment: Static standing for peri-care after toileting.                     Cognition Arousal/Alertness: Awake/alert Behavior During Therapy: WFL for tasks assessed/performed;Flat affect Overall Cognitive Status: Within Functional Limits for tasks assessed                      Exercises      General Comments General comments (skin integrity, edema, etc.): While sitting on bedside commode, pt with sudden onset of nosebleed. Pt reports this is the second time nosebleed has happened today. Noted large clots when pt blew nose. RN notified.       Pertinent Vitals/Pain Pain Assessment: Faces Faces Pain Scale: Hurts a little bit Pain Location: Headache Pain Descriptors / Indicators: Operative site guarding Pain Intervention(s): Limited activity within patient's tolerance;Monitored during session;Repositioned    Home Living                      Prior Function            PT Goals (current goals  can now be found in the care plan section) Acute Rehab PT Goals Patient Stated Goal: Go home. PT Goal Formulation: With patient Time For Goal Achievement: 09/02/15 Potential to Achieve Goals: Good Progress towards PT goals: Progressing toward goals    Frequency  Min 3X/week    PT Plan Current plan remains appropriate    Co-evaluation             End of Session Equipment Utilized During Treatment: Gait  belt Activity Tolerance: Patient limited by fatigue;Other (comment) (Limited by nosebleed) Patient left: in chair;with call bell/phone within reach;with chair alarm set     Time: [phone] PT Time Calculation (min) (ACUTE ONLY): 14 min  Charges:  $Therapeutic Activity: 8-22 mins                    G Codes:      Rolinda Roan 08-31-15, 9:54 AM   Rolinda Roan, PT, DPT Acute Rehabilitation Services Pager: [phone]

## 2015-08-21 NOTE — Progress Notes (Signed)
Rehab Admissions Coordinator Note:  Patient was screened by Retta Diones for appropriateness for an Inpatient Acute Rehab Consult.  Noted OT recommending CIR.  At this time, we are recommending Inpatient Rehab consult.  Retta Diones 08/21/2015, 3:56 PM  I can be reached at [phone].

## 2015-08-21 NOTE — Progress Notes (Signed)
   08/21/15 1512  Clinical Encounter Type  Visited With Patient;Health care provider  Visit Type Initial;Spiritual support;Social support  Referral From Nurse  Spiritual Encounters  Spiritual Needs Prayer;Emotional  Stress Factors  Patient Stress Factors Exhausted;Family relationships;Financial concerns;Health changes   Chaplain responded to a request to visit with the patient. Chaplain facilitated life review, and it seems like the patient has had a challenging life and has a lot going on. Chaplain offered support and prayer, and our services are available as needed.   Jeri Lager, Chaplain 08/21/2015 3:13 PM

## 2015-08-21 NOTE — Progress Notes (Addendum)
Occupational Therapy Evaluation Patient Details Name: [name] MRN: [id number] DOB: [birth year] Today's Date: 08/21/2015    History of Present Illness pt presents after R Frontal-Temporal-Parietal Crani for Hematoma evacuation with bone flap in R Abdomen and Seizures.  pt with hx of Emphysema, Depression, Malignant Brain Tumor, Hep C, CKD, CVA, Seizures, Pancreatitis, Cirrhosis, Ascites, Rectovaginal Fistula, and hx Polysubstance.     Clinical Impression   Unsure of pt's PLOF or home living situation due to conflicting responses from pt and reports in chart. Pt demonstrated significant cognitive limitations impacting her ability to complete ADLs and mobility independently and safely. Pt demonstrated cognitive deficits including: disorientation (time and situation), decreased attention (only 1-2 minutes) and easily distracted, difficulty sequencing tasks, L hemi-inattention vs. L visual field. While sitting on BSC, pt was not responding to her name being called and therapist touching her arm and had R inferior gaze - RN notified. Pt demonstrated impulsivity and significant balance impairments requiring min-mod assist at times. Due to pt's hx of frequent falls, cognitive deficits, balance impairments, and unknown home/assistance situation, feel as though pt is not safe to d/c home. Recommend CIR to maximize pt's independence and safety with ADLs and mobility before discharging home.  Will collaborate with pt's family, RN, Case Manager and PT to determine home living situation and PLOF to determine accurate history and assistance available.    Follow Up Recommendations  CIR;Supervision/Assistance - 24 hour    Equipment Recommendations  Other (comment) (TBD in next venue)    Recommendations for Other Services Rehab consult     Precautions / Restrictions Precautions Precautions: Fall Precaution Comments: Bone flap in R abdomen. Restrictions Weight Bearing Restrictions: No       Mobility Bed Mobility Overal bed mobility: Needs Assistance Bed Mobility: Sit to Supine       Sit to supine: Min guard;HOB elevated   General bed mobility comments: HOB elevated, use of bedrails. Min guard assist for safety - no physical assist given  Transfers Overall transfer level: Needs assistance Equipment used: 1 person hand held assist Transfers: Sit to/from Stand Sit to Stand: Min assist         General transfer comment: Min assist and close guard throughout transfers and ambulation. Pt with impaired balance and reaching outside of BOS for objects in order to maintain balance ("furniture walking"). Pt with decreased safety awareness and moving impulsively despite cues.    Balance Overall balance assessment: Needs assistance Sitting-balance support: No upper extremity supported;Feet supported Sitting balance-Leahy Scale: Fair Sitting balance - Comments: Able to sit EOB for 10 minutes to complete MMT and vision testing   Standing balance support: Single extremity supported;During functional activity Standing balance-Leahy Scale: Poor Standing balance comment: Pt with staggering steps when walking to/from bathroom and reaching for objects to stabilize balance.                            ADL Overall ADL's : Needs assistance/impaired     Grooming: Wash/dry hands;Min guard;Cueing for sequencing;Sitting Grooming Details (indicate cue type and reason): Pt had wet wipe in hand on BSC and used it on hands while voiding - cues to throw wipe away and complete toilet tasks (stand up and pericare)         Upper Body Dressing : Set up;Cueing for sequencing;Sitting Upper Body Dressing Details (indicate cue type and reason): Cues to put arms through sleeves and to stand to remove gown from underneath her Lower  Body Dressing: Moderate assistance;Cueing for sequencing;Sitting/lateral leans Lower Body Dressing Details (indicate cue type and reason): Pt reached down to  sock and suddenly grabbed head and began to cry in pain Toilet Transfer: Minimal assistance;Cueing for sequencing;Cueing for safety;Ambulation;BSC Toilet Transfer Details (indicate cue type and reason): Cues to complete toilet tasks in proper order and for safe hand placement on DME Toileting- Clothing Manipulation and Hygiene: Minimal assistance;Cueing for sequencing;Sit to/from stand Toileting - Clothing Manipulation Details (indicate cue type and reason): Cues for sequencing pericare     Functional mobility during ADLs: Minimal assistance;Cueing for safety;Cueing for sequencing General ADL Comments: Pt with difficulty sequencing through tasks, easily distracted (internally and externally), and had decreased safety awareness. Pt required min-mod assist for all transfers and ADLs due to balance impairments, impulsivity and cognitive deficits impacting ability to complete tasks.      Vision Vision Assessment?: Yes Eye Alignment: Within Functional Limits Ocular Range of Motion: Within Functional Limits Alignment/Gaze Preference: Gaze right Tracking/Visual Pursuits: Decreased smoothness of horizontal tracking;Decreased smoothness of vertical tracking   Perception Perception Perception Tested?: Yes Perception Deficits: Inattention/neglect Inattention/Neglect: Does not attend to left visual field Comments: Unclear if pt has L hemi-neglect or a L visual field cut. Pt with decreased attention and could not complete thorough visual testing   Praxis      Pertinent Vitals/Pain Pain Assessment: Faces Faces Pain Scale: Hurts even more Pain Location: R side of neck, head, and abdomen Pain Descriptors / Indicators: Grimacing;Guarding;Operative site guarding Pain Intervention(s): Limited activity within patient's tolerance;Monitored during session;Repositioned     Hand Dominance Right   Extremity/Trunk Assessment Upper Extremity Assessment Upper Extremity Assessment: LUE deficits/detail LUE  Deficits / Details: STRENGTH: all 3/5, AROM: decreased flexion in PIP and DIP joints of fingers; SENSATION: numbness reported LUE Sensation: decreased light touch LUE Coordination: decreased fine motor;decreased gross motor   Lower Extremity Assessment Lower Extremity Assessment: Generalized weakness   Cervical / Trunk Assessment Cervical / Trunk Assessment: Normal   Communication Communication Communication: No difficulties   Cognition Arousal/Alertness: Awake/alert Behavior During Therapy: Impulsive;Flat affect Overall Cognitive Status: Impaired/Different from baseline Area of Impairment: Orientation;Attention;Memory;Following commands;Safety/judgement;Awareness;Problem solving Orientation Level: Disoriented to;Situation;Time Current Attention Level: Sustained Memory: Decreased short-term memory Following Commands: Follows one step commands inconsistently;Follows one step commands with increased time Safety/Judgement: Decreased awareness of safety;Decreased awareness of deficits Awareness: Intellectual Problem Solving: Slow processing;Difficulty sequencing;Requires verbal cues;Requires tactile cues General Comments: Pt not oriented to time or situation and stated "Someone cut my stomach. They need to put my head back together." Pt also stated that therapist was her daughter. Pt did not respond to her name during session. Pt was easily distracted by other stimuli in room and had difficulty sequencing through ADL tasks and did not respond to verbal/tactile cues to redirect attention. Pt's home living information and PLOF is different from other medical providers from this admission.   General Comments       Exercises       Shoulder Instructions      Home Living Family/patient expects to be discharged to:: Private residence Living Arrangements: Children (3 adult sons) Available Help at Discharge: Family;Personal care attendant;Available 24 hours/day Type of Home: House  (townhouse) Home Access: Stairs to enter [employer] of Steps: 5   Home Layout: Two level;Bed/bath upstairs Alternate Level Stairs-Number of Steps: 7   Bathroom Shower/Tub: Tub/shower unit;Curtain Shower/tub characteristics: Architectural technologist: Standard     Home Equipment: Civil engineer, contracting   Additional Comments: Pt is a poor historian. Between  OT eval, PT eval and chart history it is unclear who pt lives with or what her home situation is like currently.      Prior Functioning/Environment Level of Independence: Needs assistance  Gait / Transfers Assistance Needed: Uses RW ADL's / Homemaking Assistance Needed: Aide assists with bathing, dressing, and IADLs.   Comments: Unsure of accuracy of home living and PLOF information - pt is poor historian    OT Diagnosis: Hemiplegia non-dominant side;Acute pain;Disturbance of vision   OT Problem List: Decreased strength;Decreased activity tolerance;Decreased range of motion;Impaired balance (sitting and/or standing);Decreased coordination;Decreased safety awareness;Decreased knowledge of use of DME or AE;Decreased knowledge of precautions;Impaired sensation;Obesity;Impaired UE functional use;Pain;Decreased cognition;Impaired vision/perception   OT Treatment/Interventions: Self-care/ADL training;DME and/or AE instruction;Energy conservation;Neuromuscular education;Therapeutic exercise;Therapeutic activities;Cognitive remediation/compensation;Visual/perceptual remediation/compensation;Balance training;Patient/family education    OT Goals(Current goals can be found in the care plan section) Acute Rehab OT Goals Patient Stated Goal: "I want to go home (tearfully)" OT Goal Formulation: With patient Time For Goal Achievement: 09/04/15 Potential to Achieve Goals: Fair ADL Goals Pt Will Perform Grooming: with supervision;standing (with no verbal cues for sequencing) Pt Will Perform Lower Body Bathing: with supervision;with adaptive  equipment;sit to/from stand (with no verbal cues for sequencing) Pt Will Perform Lower Body Dressing: with supervision;with adaptive equipment;sit to/from stand (with no verbal cues for sequencing) Pt Will Transfer to Toilet: with supervision;ambulating;bedside commode (with no verbal cues for sequencing) Pt Will Perform Toileting - Clothing Manipulation and hygiene: with supervision;sitting/lateral leans;sit to/from stand (with no verbal cues for sequencing) Pt Will Perform Tub/Shower Transfer: Tub transfer;with min guard assist;ambulating;shower seat;rolling walker Additional ADL Goal #1: Pt will independently identify 3/5 objects in L visual field by using visual scanning strategies.  OT Frequency: Min 3X/week   Barriers to D/C: Decreased caregiver support  Unsure of pt's home living situation and what help is available after discharge       Co-evaluation              End of Session Equipment Utilized During Treatment: Gait belt Nurse Communication: Mobility status;Precautions;Other (comment) (Need RW in room)  Activity Tolerance: Patient tolerated treatment well Patient left: in bed;with call bell/phone within reach;with bed alarm set   Time: 1201-1230 OT Time Calculation (min): 29 min Charges:  OT General Charges $OT Visit: 1 Procedure OT Evaluation $OT Eval High Complexity: 1 Procedure OT Treatments $Self Care/Home Management : 8-22 mins G-Codes:    Redmond Baseman, OTR/L Pager: [phone] 08/21/2015, 3:49 PM

## 2015-08-21 NOTE — Progress Notes (Addendum)
Patient c/o nose bleed characterized by darker red color noted with "snots". Patient verbalized that she was wearing O2 and feel like it is drying her nose. Patient sat WDL therefore O2 dc'ed. Patient maintainen in the upper 90s on RA. During assessment, RN noted nodules on her lower right mandibular area painful and warm to the touch. Dr. Christella Noa made aware. MD will assess once he get to see patient today. No other distress noted. Will continue to monitor.   Ave Filter, RN

## 2015-08-21 NOTE — Progress Notes (Signed)
Patient ID: [name], female   DOB: 12/23/1955, 60 y.o.   MRN: [id number] BP 120/80 mmHg  Pulse 78  Temp(Src) 98.3 F (36.8 C) (Oral)  Resp 18  Ht 5\' 8"  (1.727 m)  Wt 118 kg (260 lb 2.3 oz)  BMI 39.56 kg/m2  SpO2 98% Alert and oriented x 4 Moving all extremities well Very tender around right mandilbular angle Will order soft tissue ct of neck, and ct of the head Cranial wound is clean, and dry

## 2015-08-22 ENCOUNTER — Inpatient Hospital Stay (HOSPITAL_COMMUNITY): Payer: Medicare Other

## 2015-08-22 DIAGNOSIS — F329 Major depressive disorder, single episode, unspecified: Secondary | ICD-10-CM | POA: Diagnosis not present

## 2015-08-22 MED ORDER — ALPRAZOLAM 0.5 MG PO TABS
0.5000 mg | ORAL_TABLET | Freq: Every evening | ORAL | Status: DC | PRN
  Administered 2015-08-22: 0.5 mg via ORAL
  Filled 2015-08-22: qty 1

## 2015-08-22 NOTE — Care Management Note (Signed)
Case Management Note  Patient Details  Name: [name] MRN: [id number] Date of Birth: 1955-12-31  Subjective/Objective:                    Action/Plan: Patient is s/p craniotomy. PT recommending HHPT. CM continuing to follow for discharge needs.   Expected Discharge Date:  08/21/15               Expected Discharge Plan:  [place]  In-House Referral:     Discharge planning Services  CM Consult  Post Acute Care Choice:    Choice offered to:     DME Arranged:    DME Agency:     HH Arranged:    HH Agency:     Status of Service:  In process, will continue to follow  Medicare Important Message Given:    Date Medicare IM Given:    Medicare IM give by:    Date Additional Medicare IM Given:    Additional Medicare Important Message give by:     If discussed at [place] of Stay Meetings, dates discussed:    Additional Comments:  Pollie Friar, RN 08/22/2015, 4:09 PM

## 2015-08-22 NOTE — Progress Notes (Signed)
Patient ID: [name], female   DOB: [birth year], 60 y.o.   MRN: [id number] BP 97/67 mmHg  Pulse 96  Temp(Src) 99.3 F (37.4 C) (Oral)  Resp 16  Ht 5\' 8"  (1.727 m)  Wt 118 kg (260 lb 2.3 oz)  BMI 39.56 kg/m2  SpO2 98% Alert and oriented x 4 speech is clear and fluent Moving all extremities well Wounds are clean dry, and without signs of infection.  Appreciate ENT, continue the abx. Head CT shows resolution of subdural. kc

## 2015-08-22 NOTE — Progress Notes (Signed)
Physical Therapy Treatment Patient Details Name: Curissa Galuska MRN: [id number] DOB: 09/09/1955 Today's Date: 08/22/2015    History of Present Illness pt presents after R Frontal-Temporal-Parietal Crani for Hematoma evacuation with bone flap in R Abdomen and Seizures.  pt with hx of Emphysema, Depression, Malignant Brain Tumor, Hep C, CKD, CVA, Seizures, Pancreatitis, Cirrhosis, Ascites, Rectovaginal Fistula, and hx Polysubstance.      PT Comments    activity tolerance today limited by general malaise, pt attributes to newly diagnosed parotitis; Noted performance in yesterday's OT session, and I am interested in a Rehab Consult for their input; a CIR stay to maximize independence and safety with mobility prior to dc home could benefit Ms. Sellars  Follow Up Recommendations  CIR     Equipment Recommendations  Rolling walker with 5" wheels    Recommendations for Other Services       Precautions / Restrictions Precautions Precautions: Fall Precaution Comments: Bone flap in R abdomen.    Mobility  Bed Mobility Overal bed mobility: Needs Assistance Bed Mobility: Supine to Sit     Supine to sit: Min assist Sit to supine: Supervision   General bed mobility comments: HOB elevated, use of bedrails. Min guard assist for safety - no physical assist given  Transfers Overall transfer level: Needs assistance Equipment used: Rolling walker (2 wheeled) Transfers: Sit to/from Stand Sit to Stand: Min assist         General transfer comment: Min assist and close guard throughout transfers and ambulation. Used RW for balance; cues for RW use and proximity;  Pt with decreased safety awareness and moving impulsively despite cues.  Ambulation/Gait Ambulation/Gait assistance: Min assist Ambulation Distance (Feet): 18 Feet Assistive device: Rolling walker (2 wheeled) Gait Pattern/deviations: Step-through pattern;Decreased stride length;Trunk flexed Gait velocity: Decreased    General Gait Details: Used RW today for balance with amb; min assist for safety; decr activity otlerance, limited by malaise from parotitis; she politely declined hallway amb this afternoon   Stairs            Wheelchair Mobility    Modified Rankin (Stroke Patients Only)       Balance     Sitting balance-Leahy Scale: Fair       Standing balance-Leahy Scale: Poor                      Cognition Arousal/Alertness: Awake/alert Behavior During Therapy: Impulsive;Flat affect                   General Comments: Seems improved from yesterday's OT session; Still, limited by general malaise from Parotitis, and took incr time and reminders to follow commands/cues    Exercises      General Comments        Pertinent Vitals/Pain Pain Assessment: Faces Faces Pain Scale: Hurts whole lot Pain Location: R cheek/mandibular angle Pain Descriptors / Indicators: Aching (warm and swollen) Pain Intervention(s): Monitored during session;Limited activity within patient's tolerance    Home Living                      Prior Function            PT Goals (current goals can now be found in the care plan section) Acute Rehab PT Goals Patient Stated Goal: wants to feel better; Parotitis diagnosed today is very painful PT Goal Formulation: With patient Time For Goal Achievement: 09/02/15 Potential to Achieve Goals: Good Progress towards PT goals: Progressing toward goals  Frequency  Min 3X/week    PT Plan Discharge plan needs to be updated    Co-evaluation             End of Session Equipment Utilized During Treatment: Gait belt Activity Tolerance: Patient limited by fatigue Patient left: in bed;with call bell/phone within reach;with bed alarm set     Time: [id number] PT Time Calculation (min) (ACUTE ONLY): 19 min  Charges:  $Therapeutic Activity: 8-22 mins                    G Codes:      Quin Hoop 08/22/2015, 4:15  PM  Roney Marion, [place] Pager [phone] Office [phone]

## 2015-08-22 NOTE — Consult Note (Signed)
Reason for Consult: Facial swelling Referring Physician: Ashok Pall, MD  [name] is an 60 y.o. female.  HPI: Right facial swelling that began yesterday. It is uncomfortable and painful for her to eat anything. No prior history of salivary disease according to the patient. Under therapy currently for subdural hematoma.  Past Medical History  Diagnosis Date  . Emphysema of lung ([place])   . Depression   . Diverticulitis   . Chronic bronchitis ([place])   . Malignant brain tumor ([place]) 2007  . GERD (gastroesophageal reflux disease)   . Allergy   . Hepatitis C   . Hypertension   . Chronic kidney disease   . Migraines   . Urine incontinence   . Seizures ([place])   . Stroke [facility]) 2007    during brain surgery  . Pancreatitis, alcoholic 0000000  . Rectovaginal fistula   . H/O ETOH abuse     Sober since 2009  . H/O drug abuse     Clean since 2009  . Pancreatic ascites   . Cirrhosis [facility])     Past Surgical History  Procedure Laterality Date  . Appendectomy  1999  . Eye surgery    . Rectovaginal fistula repair w/ colostomy  2011  . Brain surgery  2007    malignant brain tumor  . Cholecystectomy  2001  . Abdominal hysterectomy  1979  . Craniotomy Right 08/16/2015    Procedure: Right Fronto-Temporal-Parietal Craniotomy for Evacuation of Hematoma with Bone Flap Placement in Abdomen;  Surgeon: Ashok Pall, MD;  Location: [facility];  Service: Neurosurgery;  Laterality: Right;    Family History  Problem Relation Age of Onset  . Hypertension Mother   . Heart disease Father   . Diabetes Father   . Cancer Sister     brain  . Cancer Grandchild 8    brain tumor    Social History:  reports that she has been smoking Cigarettes.  She has been smoking about 0.25 packs per day. She has never used smokeless tobacco. She reports that she does not drink alcohol or use illicit drugs.  Allergies:  Allergies  Allergen Reactions  . Ciprofloxacin Itching  . Dilaudid [Hydromorphone  Hcl] Itching  . Hydromorphone Itching  . Ibuprofen Other (See Comments)    Other reaction(s): Other (See Comments) Cannot take because of liver disease Cannot take because of liver disease   . Sulfa Antibiotics Hives  . Zofran [Ondansetron Hcl] Itching    Other reaction(s): Other (See Comments) Do not use; vomiting per pt  . Zofran [Ondansetron] Other (See Comments)    Do not use; vomiting per pt  . Amoxicillin Rash  . Chocolate Rash  . Fentanyl Rash  . Metformin Nausea Only  . Penicillins Rash and Other (See Comments)    Has patient had a PCN reaction causing immediate rash, facial/tongue/throat swelling, SOB or lightheadedness with hypotension: [id number] Has patient had a PCN reaction causing severe rash involving mucus membranes or skin necrosis: [id number] Has patient had a PCN reaction that required hospitalization [id number] Has patient had a PCN reaction occurring within the last 10 years: [id number] If all of the above answers are "NO", then may proceed with Cephalosporin use.   . Strawberry (Diagnostic) Rash  . Strawberry Extract Rash    Medications: Reviewed  No results found for this or any previous visit (from the past 17 hour(s)).  Ct Head Wo Contrast  08/22/2015  CLINICAL DATA:  Subdural hematoma.  Pain in the lower jaw EXAM:  CT HEAD WITHOUT CONTRAST TECHNIQUE: Contiguous axial images were obtained from the base of the skull through the vertex without intravenous contrast. COMPARISON:  08/16/2015 head CT FINDINGS: Skull and Sinuses:Wide craniectomy on the right. The scalp and bulging meninges are thickened, as expected postoperatively. There is likely tracking CSF into the scalp deep to the temporalis. Visualized orbits: Negative. Brain: Excellent evacuation of right subdural hematoma with significantly improved midline shift (some shift related to volume loss on the left as seen on 2015 study). Midline shift measures 5 mm at maximum today at the anterior  septum pellucidum. There is no intraventricular or subarachnoid hemorrhage. Edematous appearance of brain deep to the craniectomy but no convincing postoperative infarct. Stable encephalomalacia and gliosis deep to the left pterional craniotomy, mainly affecting the temporal pole and frontal operculum. IMPRESSION: 1. Evacuation of right subdural hematoma with significantly improved shift. 2. 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 Electronically Signed   By: Monte Fantasia M.D.   On: 08/22/2015 09:10   Ct Soft Tissue Neck Wo Contrast  08/22/2015  CLINICAL DATA:  Recent craniotomy for subdural hematoma. Right-sided parietal headache. Swelling and redness and pain to the right mandible. EXAM: CT NECK WITHOUT CONTRAST TECHNIQUE: Multidetector CT imaging of the neck was performed following the standard protocol without intravenous contrast. COMPARISON:  CT head 08/16/2015. FINDINGS: Pharynx and larynx: No focal mucosal or submucosal lesions are evident. Vocal cords are midline and symmetric. The tongue base is within normal limits. There is asymmetric stranding within the right parapharyngeal fat. Salivary glands: There is marked edematous change in stranding about the right parotid gland. Enlarged intra parotid and periparotid lymph nodes appear reactive. They left parotid gland is within normal limits. The submandibular glands are normal bilaterally. Thyroid: Within normal limits Lymph nodes: Enlarged right level 2 periparotid lymph nodes appear reactive. Right submandibular nodes are present as well. No significant left-sided cervical adenopathy is present. Vascular: A right IJ catheter is in place. No significant atherosclerotic disease is evident. Limited intracranial: The left craniotomy is again noted. Edema within the left frontal lobe and left temporal tip are again noted. Previously-seen hemorrhage has been evacuated. No new hemorrhage is evident. Visualized orbits: Within normal limits  Mastoids and visualized paranasal sinuses: A polyp or mucous retention cyst is again noted in the left maxillary sinus. Skeleton: There straightening of the normal cervical lordosis. The patient is edentulous. No focal lytic or blastic lesions are present. Upper chest: Is patchy bilateral ground-glass attenuation is present. There is a small effusion on the right. IMPRESSION: 1. Marked inflammatory changes appear to be centered in the right parotid gland. There is no focal mass lesion or obstruction. The findings are compatible with a nonspecific parotitis. 2. Right level 1 and level 2 lymph nodes appear reactive. 3. Postsurgical changes of left craniotomy. 4. Right IJ line. 5. Patchy ground-glass attenuation the lung apices bilaterally concerning for edema or infection. Electronically Signed   By: San Morelle M.D.   On: 08/22/2015 09:28   Dg Chest Port 1 View  08/20/2015  CLINICAL DATA:  59 year old female with central line placement. EXAM: PORTABLE CHEST 1 VIEW COMPARISON:  Radiograph dated 05/21/2015 FINDINGS: A right IJ central venous catheter with tip over central SVC is noted. There is no pneumothorax. Mild central vascular prominence may represent a degree of congestion. Linear left mid lung field atelectasis/scarring. No focal consolidation, or pleural effusion noted. There is stable cardiac silhouette. The osseous structures appear unremarkable. IMPRESSION: Right IJ central venous line with tip over  central SVC. No pneumothorax. Probable mild congestive changes.  No focal consolidation. Electronically Signed   By: Anner Crete M.D.   On: 08/20/2015 20:19    [id number] except as listed in admit H&P  Blood pressure 121/72, pulse 98, temperature 99.5 F (37.5 C), temperature source Oral, resp. rate 20, height 5\' 8"  (1.727 m), weight 118 kg (260 lb 2.3 oz), SpO2 97 %.  PHYSICAL EXAM: Overall appearance:  Healthy appearing, in no distress Head:  Surgical staples in place. Ears:  External ears look normal. Nose: External nose is healthy in appearance. Internal nasal exam free of any lesions or obstruction. Oral Cavity/Pharynx:  There are no mucosal lesions or masses identified. She is edentulous. Mucous membranes are dry. No palpable buccal masses. Larynx/Hypopharynx: Deferred Neuro:  No identifiable neurologic deficits. Neck: No palpable neck masses. Right parotid firm, swollen and tender but nonfluctuant and there is no skin erythema.  Studies Reviewed:CT of neck  Procedures: none   Assessment/Plan: Right side acute parotitis, no clinical or radiographic evidence of calculus or abscess. Will treat medically with intravenous fluids and antibiotics. Should resolve in the next 3-6 days. Contact me if it gets worse.  Baby Gieger 08/22/2015, 12:23 PM

## 2015-08-23 DIAGNOSIS — R269 Unspecified abnormalities of gait and mobility: Secondary | ICD-10-CM

## 2015-08-23 DIAGNOSIS — I69354 Hemiplegia and hemiparesis following cerebral infarction affecting left non-dominant side: Secondary | ICD-10-CM

## 2015-08-23 DIAGNOSIS — I6200 Nontraumatic subdural hemorrhage, unspecified: Secondary | ICD-10-CM

## 2015-08-23 MED ORDER — ALPRAZOLAM 0.5 MG PO TABS
1.0000 mg | ORAL_TABLET | Freq: Two times a day (BID) | ORAL | Status: DC
  Administered 2015-08-23 – 2015-08-30 (×14): 1 mg via ORAL
  Filled 2015-08-23 (×15): qty 2

## 2015-08-23 MED ORDER — CLINDAMYCIN PHOSPHATE 300 MG/50ML IV SOLN
300.0000 mg | Freq: Four times a day (QID) | INTRAVENOUS | Status: DC
  Administered 2015-08-23 – 2015-08-25 (×9): 300 mg via INTRAVENOUS
  Filled 2015-08-23 (×10): qty 50

## 2015-08-23 MED ORDER — VANCOMYCIN HCL IN DEXTROSE 1-5 GM/200ML-% IV SOLN
1000.0000 mg | Freq: Two times a day (BID) | INTRAVENOUS | Status: DC
  Administered 2015-08-23 – 2015-08-29 (×12): 1000 mg via INTRAVENOUS
  Filled 2015-08-23 (×17): qty 200

## 2015-08-23 NOTE — Progress Notes (Signed)
Physical Therapy Treatment Patient Details Name: [name] MRN: [id number] DOB: 02-13-1956 Today's Date: 08/23/2015    History of Present Illness pt presents after R Frontal-Temporal-Parietal Crani for Hematoma evacuation with bone flap in R Abdomen and Seizures.  pt with hx of Emphysema, Depression, Malignant Brain Tumor, Hep C, CKD, CVA, Seizures, Pancreatitis, Cirrhosis, Ascites, Rectovaginal Fistula, and hx Polysubstance.      PT Comments    Treatment limited by pain in pt's right face as well as abdomen, agreed to mobilize within room only. Min guard A needed for safety as pt continues with impulsiveness with mobility. PT will continue to follow.   Follow Up Recommendations  CIR     Equipment Recommendations  Rolling walker with 5" wheels    Recommendations for Other Services       Precautions / Restrictions Precautions Precautions: Fall Precaution Comments: Bone flap in R abdomen. Restrictions Weight Bearing Restrictions: No    Mobility  Bed Mobility Overal bed mobility: Needs Assistance Bed Mobility: Supine to Sit;Sit to Supine     Supine to sit: Supervision Sit to supine: Supervision   General bed mobility comments: pt able to get in and out of bed without physical assist, moves quickly  Transfers Overall transfer level: Needs assistance Equipment used:  (pushed IV pole) Transfers: Sit to/from Stand Sit to Stand: Supervision         General transfer comment: pt stood from bed and toilet with supervision, no LOB  Ambulation/Gait Ambulation/Gait assistance: Min guard Ambulation Distance (Feet): 15 Feet Assistive device:  (IV pole) Gait Pattern/deviations: Step-through pattern;Decreased stride length Gait velocity: Decreased Gait velocity interpretation: Below normal speed for age/gender General Gait Details: pt refused to ambulate further than bathroom due to facial and abdominal pain, pushed IV pole, did not navigate obstacles very well and  was very impulsive, tried to bend over and move BSC out of the way. vc's for waiting for PT to assist her.    Stairs            Wheelchair Mobility    Modified Rankin (Stroke Patients Only)       Balance Overall balance assessment: Needs assistance Sitting-balance support: No upper extremity supported Sitting balance-Leahy Scale: Fair     Standing balance support: No upper extremity supported Standing balance-Leahy Scale: Fair Standing balance comment: able to stand without UE support but not accept physical challenge                    Cognition Arousal/Alertness: Awake/alert Behavior During Therapy: Impulsive;Flat affect Overall Cognitive Status: Impaired/Different from baseline Area of Impairment: Safety/judgement     Memory: Decreased short-term memory Following Commands: Follows multi-step commands with increased time Safety/Judgement: Decreased awareness of safety   Problem Solving: Requires verbal cues General Comments: pt appropriate throughout session, continues with impuslive behavior and decreased awareness of safety concerns. Pt feels that nothing has been done to manage her face despite discussing antibiotics that she is on    Exercises      General Comments        Pertinent Vitals/Pain Pain Assessment: Faces Faces Pain Scale: Hurts whole lot Pain Location: right cheek and abdomen Pain Descriptors / Indicators: Sore Pain Intervention(s): Limited activity within patient's tolerance    Home Living                      Prior Function            PT Goals (current goals can  now be found in the care plan section) Acute Rehab PT Goals Patient Stated Goal: feel better PT Goal Formulation: With patient Time For Goal Achievement: 09/02/15 Potential to Achieve Goals: Good Progress towards PT goals: Progressing toward goals    Frequency  Min 3X/week    PT Plan Current plan remains appropriate    Co-evaluation              End of Session Equipment Utilized During Treatment: Gait belt Activity Tolerance: Patient limited by pain Patient left: in bed;with bed alarm set;with call bell/phone within reach;with family/visitor present     Time: [id number] PT Time Calculation (min) (ACUTE ONLY): 10 min  Charges:  $Gait Training: 8-22 mins                    G Codes:     Leighton Roach, PT  Acute Rehab Services  [phone]  Leighton Roach 08/23/2015, 3:30 PM

## 2015-08-23 NOTE — Progress Notes (Addendum)
Occupational Therapy Treatment Note (late entry)  Pt appears to be doing better this date than last treatment session.  She is oriented x 4, she was able to scan her environment for needed items, but she does demonstrate poor safety awareness, fatigues with activity, and demonstrates impaired attention.  She reports she has CAP worker 6 hours/day 7 days/week and spouse works during day, but is home all other times.  Unsure of her baseline cognitive status.  Continue to feel CIR is most optimal discharge plan to maximize her safety and independence, if she is to discharge home, recommend 24 hour supervision as she is at high risk for fall or injury.    Recommend 3in1 commode, and tub transfer bench.      08/22/15 1300  OT Visit Information  Last OT Received On 08/22/15  Assistance Needed +1  History of Present Illness pt presents after R Frontal-Temporal-Parietal Crani for Hematoma evacuation with bone flap in R Abdomen and Seizures.  pt with hx of Emphysema, Depression, Malignant Brain Tumor, Hep C, CKD, CVA, Seizures, Pancreatitis, Cirrhosis, Ascites, Rectovaginal Fistula, and hx Polysubstance.    OT Time Calculation  OT Start Time (ACUTE ONLY) 1157  OT Stop Time (ACUTE ONLY) 1220  OT Time Calculation (min) 23 min  Precautions  Precautions Fall  Precaution Comments Bone flap in R abdomen.  Pain Assessment  Pain Assessment Faces  Faces Pain Scale 4  Pain Location Rt cheek   Pain Descriptors / Indicators Aching  Pain Intervention(s) Monitored during session  Cognition  Arousal/Alertness Awake/alert  Behavior During Therapy Impulsive;Flat affect  Overall Cognitive Status Impaired/Different from baseline  Area of Impairment Attention;Safety/judgement;Problem solving  Current Attention Level Selective  Safety/Judgement Decreased awareness of safety  Awareness Intellectual  Problem Solving Requires verbal cues  General Comments Pt very distractable.  She is able to recall details of  conversations with others and events of the day.  She demonstrates poor safety awareness.  She was unable to look up phone numbers in her cell phone, but was able to recall her mother's phone number and make phone call to her mother several times during session.   Unsure of pt's baseline status   ADL  Overall ADL's  Needs assistance/impaired  Eating/Feeding Independent  Grooming Wash/dry hands;Wash/dry face;Min Chiropodist guard;Ambulation;Comfort height toilet;BSC  Toileting- Water quality scientist and Hygiene Min guard;Sit to/from stand  Functional mobility during ADLs Min guard;Minimal assistance  General ADL Comments Pt fatiques rapidly with activity and at those times demonstrates decreased safety awareness.  She reports she lives with her spouse who works during the day, and she has 6 hours CAPs 7 days/week   Bed Mobility  Overal bed mobility Needs Assistance  Bed Mobility Supine to Sit;Sit to Supine  Supine to sit Supervision  Sit to supine Supervision  Balance  Overall balance assessment Needs assistance  Sitting-balance support Feet supported  Sitting balance-Leahy Scale Good  Standing balance support Single extremity supported  Standing balance-Leahy Scale Poor  Vision- Assessment  Additional Comments Pt able to locate items in room and on sinke. During functional scanning activities.  Will continue to assess   Transfers  Overall transfer level Needs assistance  Equipment used 1 person hand held assist  Transfers Stand Pivot Transfers;Sit to/from Stand  Sit to Stand Min guard;Min assist  General transfer comment Pt intially required min guard assist, but as she fatigued, required min A.   OT - End of Session  Equipment Utilized During Treatment Gait belt  Activity Tolerance Patient limited by fatigue  Patient left in bed;with call bell/phone within reach;with bed alarm set  Nurse Communication Mobility status  OT Assessment/Plan  OT Plan Discharge  plan remains appropriate  OT Frequency (ACUTE ONLY) Min 3X/week  Recommendations for Other Services Rehab consult  Follow Up Recommendations CIR;Supervision/Assistance - 24 hour  OT Equipment 3 in 1 bedside comode;Tub/shower bench  OT Goal Progression  Progress towards OT goals Progressing toward goals  OT General Charges  $OT Visit 1 Procedure  OT Treatments  $Self Care/Home Management  8-22 mins  $Therapeutic Activity 8-22 mins  [employer], OTR/L [phone]

## 2015-08-23 NOTE — Consult Note (Signed)
Physical Medicine and Rehabilitation Consult Reason for Consult: Traumatic Right hand hemispheric subdural hematoma Referring Physician: Dr. Christella Noa   HPI: [name] is a 60 y.o. 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 By chart review patient lives with spouse in [place] and a personal care attendant 7 days a week 6 hours a day. One level home . She used a walker prior to admission. Presented 08/16/2015 with reports of multiple falls and left side weakness. CT scan imaging revealed a large right panhemispheric subdural hematoma with mass effect. Underwent right frontotemporal parietal craniotomy for evacuation of hematoma with bone flap placement in abdomen 08/16/2015 per Dr. Cyndy Freeze. Hospital course right side acute parotitis with follow-up Dr. Constance Holster and treated medically with intravenous fluids and antibiotics. Latest follow-up cranial CT scan with significantly improved shift. Maintained on a mechanical soft diet. MRSA PCR screen positive with contact precautions. Physical and occupational therapy evaluations completed and ongoing recommendations for physical medicine rehabilitation consult.  Patient states she feels very anxious. She has a history of depression and also tells me that she has a history of paranoid schizophrenia Review of Systems  Constitutional: Negative for fever and chills.  HENT: Negative for hearing loss.   Eyes: Positive for blurred vision. Negative for double vision.  Respiratory: Negative for cough and shortness of breath.   Cardiovascular: Positive for leg swelling. Negative for chest pain and palpitations.  Gastrointestinal: Positive for constipation. Negative for nausea and vomiting.       GERD  Genitourinary: Negative for dysuria and hematuria.  Musculoskeletal: Positive for myalgias.  Neurological: Positive for seizures, weakness and headaches.  Psychiatric/Behavioral: Positive  for depression.  All other systems reviewed and are negative.  Past Medical History  Diagnosis Date  . Emphysema of lung ([place])   . Depression   . Diverticulitis   . Chronic bronchitis ([place])   . Malignant brain tumor ([place]) 2007  . GERD (gastroesophageal reflux disease)   . Allergy   . Hepatitis C   . Hypertension   . Chronic kidney disease   . Migraines   . Urine incontinence   . Seizures ([address])   . Stroke [facility]) 2007    during brain surgery  . Pancreatitis, alcoholic 0000000  . Rectovaginal fistula   . H/O ETOH abuse     Sober since 2009  . H/O drug abuse     Clean since 2009  . Pancreatic ascites   . Cirrhosis [facility])    Past Surgical History  Procedure Laterality Date  . Appendectomy  1999  . Eye surgery    . Rectovaginal fistula repair w/ colostomy  2011  . Brain surgery  2007    malignant brain tumor  . Cholecystectomy  2001  . Abdominal hysterectomy  1979  . Craniotomy Right 08/16/2015    Procedure: Right Fronto-Temporal-Parietal Craniotomy for Evacuation of Hematoma with Bone Flap Placement in Abdomen;  Surgeon: Ashok Pall, MD;  Location: [place] [facility];  Service: Neurosurgery;  Laterality: Right;   Family History  Problem Relation Age of Onset  . Hypertension Mother   . Heart disease Father   . Diabetes Father   . Cancer Sister     brain  . Cancer Grandchild 8    brain tumor   Social History:  reports that she has been smoking Cigarettes.  She has been smoking about 0.25 packs per day. She has never used smokeless tobacco. She reports that she  does not drink alcohol or use illicit drugs. Allergies:  Allergies  Allergen Reactions  . Ciprofloxacin Itching  . Dilaudid [Hydromorphone Hcl] Itching  . Hydromorphone Itching  . Ibuprofen Other (See Comments)    Other reaction(s): Other (See Comments) Cannot take because of liver disease Cannot take because of liver disease   . Sulfa Antibiotics Hives  . Zofran [Ondansetron Hcl] Itching    Other reaction(s):  Other (See Comments) Do not use; vomiting per pt  . Zofran [Ondansetron] Other (See Comments)    Do not use; vomiting per pt  . Amoxicillin Rash  . Chocolate Rash  . Fentanyl Rash  . Metformin Nausea Only  . Penicillins Rash and Other (See Comments)    Has patient had a PCN reaction causing immediate rash, facial/tongue/throat swelling, SOB or lightheadedness with hypotension: [id number] Has patient had a PCN reaction causing severe rash involving mucus membranes or skin necrosis: [id number] Has patient had a PCN reaction that required hospitalization [id number] Has patient had a PCN reaction occurring within the last 10 years: [id number] If all of the above answers are "NO", then may proceed with Cephalosporin use.   . Strawberry (Diagnostic) Rash  . Strawberry Extract Rash   Medications Prior to Admission  Medication Sig Dispense Refill  . ADVAIR DISKUS 500-50 MCG/DOSE AEPB inhale 1 dose by mouth twice a day  0  . amitriptyline (ELAVIL) 50 MG tablet Take 1 tablet (50 mg total) by mouth at bedtime. 30 tablet 3  . CREON 12000 UNITS CPEP Take 24,000 Units by mouth 3 (three) times daily before meals.     . furosemide (LASIX) 20 MG tablet Take 20 mg by mouth daily.    Marland Kitchen gabapentin (NEURONTIN) 300 MG capsule Take 300 mg by mouth 3 (three) times daily.     Marland Kitchen levETIRAcetam (KEPPRA) 500 MG tablet Take 1 tablet (500 mg total) by mouth 2 (two) times daily. 60 tablet 3  . meloxicam (MOBIC) 15 MG tablet Take 1 tablet (15 mg total) by mouth 2 (two) times daily as needed for pain. 60 tablet 3  . metoprolol succinate (TOPROL-XL) 25 MG 24 hr tablet Take 1 tablet (25 mg total) by mouth daily. 30 tablet 4  . omeprazole (PRILOSEC) 20 MG capsule Take 1 capsule (20 mg total) by mouth daily. 30 capsule 11  . Oxycodone HCl 20 MG TABS Take 1 tablet by mouth every 8 (eight) hours as needed.  0  . pravastatin (PRAVACHOL) 10 MG tablet Take 10 mg by mouth daily.     . sertraline (ZOLOFT) 50 MG tablet  Take 100 mg by mouth daily.     Marland Kitchen zolpidem (AMBIEN) 10 MG tablet Take 1 tablet (10 mg total) by mouth at bedtime. 30 tablet 3  . albuterol (PROAIR HFA) 108 (90 BASE) MCG/ACT inhaler Inhale 2 puffs into the lungs every 4 (four) hours as needed for wheezing.     . Bismuth Subsalicylate (KAOPECTATE EXTRA STRENGTH) 525 MG/15ML SUSP Take 15 mLs (525 mg total) by mouth every 2 (two) hours as needed. 420 mL 0  . cefUROXime (CEFTIN) 500 MG tablet Take 1 tablet (500 mg total) by mouth 2 (two) times daily with a meal. (Patient not taking: Reported on 08/17/2015) 14 tablet 0  . fluconazole (DIFLUCAN) 150 MG tablet Take 1 tablet by mouth weekly (Patient not taking: Reported on 08/17/2015) 24 tablet 0  . hydrocortisone cream 0.5 % Apply 1 application topically 2 (two) times daily as needed for itching. 30 g 0  .  Ipratropium-Albuterol (COMBIVENT) 20-100 MCG/ACT AERS respimat Inhale into the lungs.    . lactulose (CHRONULAC) 10 GM/15ML solution Take 20 g by mouth daily as needed for mild constipation.     . metroNIDAZOLE (FLAGYL) 500 MG tablet Take 1 tablet (500 mg total) by mouth 3 (three) times daily. (Patient not taking: Reported on 08/17/2015) 21 tablet 0  . tiotropium (SPIRIVA) 18 MCG inhalation capsule Place 18 mcg into inhaler and inhale 2 (two) times daily as needed (shortness of breath).     . triamcinolone (NASACORT ALLERGY 24HR) 55 MCG/ACT AERO nasal inhaler Place 2 sprays into the nose daily as needed (allergies).      Home: Home Living Family/patient expects to be discharged to:: Private residence Living Arrangements: Children (3 adult sons) Available Help at Discharge: Family, Personal care attendant, Available 24 hours/day Type of Home: House (townhouse) Home Access: Stairs to enter Technical brewer of Steps: 5 Home Layout: Two level, Bed/bath upstairs Alternate Level Stairs-Number of Steps: 7 Bathroom Shower/Tub: Tub/shower unit, Architectural technologist: Standard Home Equipment: Shower  seat Additional Comments: Pt is a poor historian. Between OT eval, PT eval and chart history it is unclear who pt lives with or what her home situation is like currently.  Functional History: Prior Function Level of Independence: Needs assistance Gait / Transfers Assistance Needed: Uses RW ADL's / Homemaking Assistance Needed: Aide assists with bathing, dressing, and IADLs. Comments: Unsure of accuracy of home living and PLOF information - pt is poor historian Functional Status:  Mobility: Bed Mobility Overal bed mobility: Needs Assistance Bed Mobility: Supine to Sit Supine to sit: Min assist Sit to supine: Supervision General bed mobility comments: HOB elevated, use of bedrails. Min guard assist for safety - no physical assist given Transfers Overall transfer level: Needs assistance Equipment used: Rolling walker (2 wheeled) Transfers: Sit to/from Stand Sit to Stand: Min assist Stand pivot transfers: Min assist General transfer comment: Min assist and close guard throughout transfers and ambulation. Used RW for balance; cues for RW use and proximity;  Pt with decreased safety awareness and moving impulsively despite cues. Ambulation/Gait Ambulation/Gait assistance: Min assist Ambulation Distance (Feet): 18 Feet Assistive device: Rolling walker (2 wheeled) Gait Pattern/deviations: Step-through pattern, Decreased stride length, Trunk flexed General Gait Details: Used RW today for balance with amb; min assist for safety; decr activity otlerance, limited by malaise from parotitis; she politely declined hallway amb this afternoon Gait velocity: Decreased Gait velocity interpretation: Below normal speed for age/gender    ADL: ADL Overall ADL's : Needs assistance/impaired Grooming: Wash/dry hands, Min guard, Cueing for sequencing, Sitting Grooming Details (indicate cue type and reason): Pt had wet wipe in hand on BSC and used it on hands while voiding - cues to throw wipe away and  complete toilet tasks (stand up and pericare) Upper Body Dressing : Set up, Cueing for sequencing, Sitting Upper Body Dressing Details (indicate cue type and reason): Cues to put arms through sleeves and to stand to remove gown from underneath her Lower Body Dressing: Moderate assistance, Cueing for sequencing, Sitting/lateral leans Lower Body Dressing Details (indicate cue type and reason): Pt reached down to sock and suddenly grabbed head and began to cry in pain Toilet Transfer: Minimal assistance, Cueing for sequencing, Cueing for safety, Ambulation, BSC Toilet Transfer Details (indicate cue type and reason): Cues to complete toilet tasks in proper order and for safe hand placement on DME Toileting- Clothing Manipulation and Hygiene: Minimal assistance, Cueing for sequencing, Sit to/from stand Toileting Runner, broadcasting/film/video  Details (indicate cue type and reason): Cues for sequencing pericare Functional mobility during ADLs: Minimal assistance, Cueing for safety, Cueing for sequencing General ADL Comments: Pt with difficulty sequencing through tasks, easily distracted (internally and externally), and had decreased safety awareness. Pt required min-mod assist for all transfers and ADLs due to balance impairments, impulsivity and cognitive deficits impacting ability to complete tasks.   Cognition: Cognition Overall Cognitive Status: Impaired/Different from baseline Orientation Level: Oriented X4 Cognition Arousal/Alertness: Awake/alert Behavior During Therapy: Impulsive, Flat affect Overall Cognitive Status: Impaired/Different from baseline Area of Impairment: Orientation, Attention, Memory, Following commands, Safety/judgement, Awareness, Problem solving Orientation Level: Disoriented to, Situation, Time Current Attention Level: Sustained Memory: Decreased short-term memory Following Commands: Follows one step commands inconsistently, Follows one step commands with increased  time Safety/Judgement: Decreased awareness of safety, Decreased awareness of deficits Awareness: Intellectual Problem Solving: Slow processing, Difficulty sequencing, Requires verbal cues, Requires tactile cues General Comments: Seems improved from yesterday's OT session; Still, limited by general malaise from Parotitis, and took incr time and reminders to follow commands/cues  Blood pressure 119/74, pulse 99, temperature 99.5 F (37.5 C), temperature source Oral, resp. rate 20, height 5\' 8"  (1.727 m), weight 118 kg (260 lb 2.3 oz), SpO2 97 %. Physical Exam  HENT:  Craniotomy site clean and dry.  Eyes: EOM are normal.  Neck: Normal range of motion. Neck supple. No thyromegaly present.  Cardiovascular: Normal rate and regular rhythm.   Respiratory: Effort normal and breath sounds normal. No respiratory distress.  GI: Soft. Bowel sounds are normal. She exhibits no distension.  Neurological: She is alert.  Voice is a bit hoarse but fully intelligible. She follows commands. Fair awareness of deficits.  Motor strength is 5/5 in the right deltoid, biceps, triceps, grip, hip flexor, knee extensor, ankle dorsal flexor 3 minus in the left deltoid, biceps, triceps, grip, hip flexor, knee extensor, ankle dorsiflexor and plantar flexor Sensation intact to light touch in bilateral upper and lower limbs Orientation is to person place and time  No results found for this or any previous visit (from the past 24 hour(s)). Ct Head Wo Contrast  08/22/2015  CLINICAL DATA:  Subdural hematoma.  Pain in the lower jaw EXAM: CT HEAD WITHOUT CONTRAST TECHNIQUE: Contiguous axial images were obtained from the base of the skull through the vertex without intravenous contrast. COMPARISON:  08/16/2015 head CT FINDINGS: Skull and Sinuses:Wide craniectomy on the right. The scalp and bulging meninges are thickened, as expected postoperatively. There is likely tracking CSF into the scalp deep to the temporalis. Visualized  orbits: Negative. Brain: Excellent evacuation of right subdural hematoma with significantly improved midline shift (some shift related to volume loss on the left as seen on 2015 study). Midline shift measures 5 mm at maximum today at the anterior septum pellucidum. There is no intraventricular or subarachnoid hemorrhage. Edematous appearance of brain deep to the craniectomy but no convincing postoperative infarct. Stable encephalomalacia and gliosis deep to the left pterional craniotomy, mainly affecting the temporal pole and frontal operculum. IMPRESSION: 1. Evacuation of right subdural hematoma with significantly improved shift. 2. 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 Electronically Signed   By: Monte Fantasia M.D.   On: 08/22/2015 09:10   Ct Soft Tissue Neck Wo Contrast  08/22/2015  CLINICAL DATA:  Recent craniotomy for subdural hematoma. Right-sided parietal headache. Swelling and redness and pain to the right mandible. EXAM: CT NECK WITHOUT CONTRAST TECHNIQUE: Multidetector CT imaging of the neck was performed following the standard protocol without intravenous contrast. COMPARISON:  CT head 08/16/2015. FINDINGS: Pharynx and larynx: No focal mucosal or submucosal lesions are evident. Vocal cords are midline and symmetric. The tongue base is within normal limits. There is asymmetric stranding within the right parapharyngeal fat. Salivary glands: There is marked edematous change in stranding about the right parotid gland. Enlarged intra parotid and periparotid lymph nodes appear reactive. They left parotid gland is within normal limits. The submandibular glands are normal bilaterally. Thyroid: Within normal limits Lymph nodes: Enlarged right level 2 periparotid lymph nodes appear reactive. Right submandibular nodes are present as well. No significant left-sided cervical adenopathy is present. Vascular: A right IJ catheter is in place. No significant atherosclerotic disease is evident.  Limited intracranial: The left craniotomy is again noted. Edema within the left frontal lobe and left temporal tip are again noted. Previously-seen hemorrhage has been evacuated. No new hemorrhage is evident. Visualized orbits: Within normal limits Mastoids and visualized paranasal sinuses: A polyp or mucous retention cyst is again noted in the left maxillary sinus. Skeleton: There straightening of the normal cervical lordosis. The patient is edentulous. No focal lytic or blastic lesions are present. Upper chest: Is patchy bilateral ground-glass attenuation is present. There is a small effusion on the right. IMPRESSION: 1. Marked inflammatory changes appear to be centered in the right parotid gland. There is no focal mass lesion or obstruction. The findings are compatible with a nonspecific parotitis. 2. Right level 1 and level 2 lymph nodes appear reactive. 3. Postsurgical changes of left craniotomy. 4. Right IJ line. 5. Patchy ground-glass attenuation the lung apices bilaterally concerning for edema or infection. Electronically Signed   By: San Morelle M.D.   On: 08/22/2015 09:28    Assessment/Plan: Diagnosis: Traumatic subdural hematoma status post craniotomy with neurologic gait disorder 1. Does the need for close, 24 hr/day medical supervision in concert with the patient's rehab needs make it unreasonable for this patient to be served in a less intensive setting? Yes 2. 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 Due to bladder management, bowel management, safety, skin/wound care, disease management, medication administration, pain management and patient education, does the patient require 24 hr/day rehab nursing? Yes 4. Does the patient require coordinated care of a physician, rehab nurse, PT (1-2 hrs/day, 5 days/week), OT (1-2 hrs/day, 5 days/week) and SLP (0.5-1  hrs/day, 5 days/week) to address physical and functional deficits in the context of the above medical diagnosis(es)? Yes Addressing deficits in the following areas: balance, endurance, locomotion, strength, transferring, bowel/bladder control, bathing, dressing, feeding, grooming, toileting, cognition and psychosocial support 5. Can the patient actively participate in an intensive therapy program of at least 3 hrs of therapy per day at least 5 days per week? Yes 6. The potential for patient to make measurable gains while on inpatient rehab is good 7. Anticipated functional outcomes upon discharge from inpatient rehab are supervision  with PT, supervision with OT, supervision with SLP. 8. Estimated rehab length of stay to reach the above functional goals is: 10-14 days 9. Does the patient have adequate social supports and living environment to accommodate these discharge functional goals? Yes and Potentially 10. Anticipated D/C setting: Home 11. Anticipated post D/C treatments: [place] therapy and also has hired caregiver 10. Overall Rehab/Functional Prognosis: good  RECOMMENDATIONS: This patient's condition is appropriate for continued rehabilitative care in the following setting: CIR Patient has agreed to participate in recommended program. Yes Note that insurance prior authorization may  be required for reimbursement for recommended care.  Comment: Patient may benefit from psychiatry consult given her anxiety and prior history    08/23/2015

## 2015-08-23 NOTE — Progress Notes (Signed)
Pharmacy Antibiotic Note  [name] is a 60 y.o. female admitted on 08/16/2015 with parotitis.  Pharmacy has been consulted for vancomycin dosing.  Plan: Vancomycin 1000 mg IV every 12 hours.  Goal trough 15-20 mcg/mL.  Height: 5\' 8"  (172.7 cm) Weight: 260 lb 2.3 oz (118 kg) IBW/kg (Calculated) : 63.9  Temp (24hrs), Avg:99.3 F (37.4 C), Min:98.2 F (36.8 C), Max:101.7 F (38.7 C)  No results for input(s): WBC, CREATININE, LATICACIDVEN, VANCOTROUGH, VANCOPEAK, VANCORANDOM, GENTTROUGH, GENTPEAK, GENTRANDOM, TOBRATROUGH, TOBRAPEAK, TOBRARND, AMIKACINPEAK, AMIKACINTROU, AMIKACIN in the last 168 hours.  Estimated Creatinine Clearance: 103.5 mL/min (by C-G formula based on Cr of 0.74).    Allergies  Allergen Reactions  . Ciprofloxacin Itching  . Dilaudid [Hydromorphone Hcl] Itching  . Hydromorphone Itching  . Ibuprofen Other (See Comments)    Other reaction(s): Other (See Comments) Cannot take because of liver disease Cannot take because of liver disease   . Sulfa Antibiotics Hives  . Zofran [Ondansetron Hcl] Itching    Other reaction(s): Other (See Comments) Do not use; vomiting per pt  . Zofran [Ondansetron] Other (See Comments)    Do not use; vomiting per pt  . Amoxicillin Rash  . Chocolate Rash  . Fentanyl Rash  . Metformin Nausea Only  . Penicillins Rash and Other (See Comments)    Has patient had a PCN reaction causing immediate rash, facial/tongue/throat swelling, SOB or lightheadedness with hypotension: [id number] Has patient had a PCN reaction causing severe rash involving mucus membranes or skin necrosis: no:[id number]} Has patient had a PCN reaction that required hospitalization [id number] Has patient had a PCN reaction occurring within the last 10 years: [id number] If all of the above answers are "NO", then may proceed with Cephalosporin use.   . Strawberry (Diagnostic) Rash  . Strawberry Extract Rash    Antimicrobials this admission: 2/15  vancomycin >>  2/15 clindamycin >>  2/15 metronidazole >>  Dose adjustments this admission: none  Microbiology results: 2/9 MRSA PCR: positive   Thank you for allowing [place] to participate in this patients care. Jens Som, PharmD Pager: [phone] 08/23/2015 7:32 PM

## 2015-08-23 NOTE — Progress Notes (Signed)
Patient ID: [name], female   DOB: Dec 27, 1955, 60 y.o.   MRN: [id number] BP 107/71 mmHg  Pulse 88  Temp(Src) 99 F (37.2 C) (Oral)  Resp 18  Ht 5\' 8"  (1.727 m)  Wt 118 kg (260 lb 2.3 oz)  BMI 39.56 kg/m2  SpO2 96% Alert and oriented x 4, speech is clear and fluent Moving all extremities well Right face is still painful.  Can be discharged once parotid is under control.

## 2015-08-23 NOTE — Progress Notes (Signed)
Pt complaint of increased pain and warmth to right jaw. Notified Md Constance Holster who stated that he would start her on IV fluids and antibiotics. IV team consult placed. Pain medication administered. Will continue to monitor.

## 2015-08-23 NOTE — Progress Notes (Signed)
I met with pt at bedside. She reports she has been going to the bathroom and to the chair on her own.She has an aide 6 hrs per day but is alone about 4 hrs per day when her spouse works. I asked if she would agree to go home with her Mom and she says no they are not close like that. Pt does not feel that she needs any more rehab before going home. She feels the only thing is her swollen face on her right cheek that is swollen and very painful which is causing her great pain. I have discussed with bedside RN and RN CM. Recommend home with [facility] at d/c. [phone]

## 2015-08-24 NOTE — Progress Notes (Signed)
Occupational Therapy Treatment Patient Details Name: [name] MRN: [id number] DOB: [birth year] Today's Date: 08/24/2015    History of present illness pt presents after R Frontal-Temporal-Parietal Crani for Hematoma evacuation with bone flap in R Abdomen and Seizures.  pt with hx of Emphysema, Depression, Malignant Brain Tumor, Hep C, CKD, CVA, Seizures, Pancreatitis, Cirrhosis, Ascites, Rectovaginal Fistula, and hx Polysubstance.     OT comments  [name].  Currently, [name].  She demonstrates impaired safety awareness, and impulsivity, but unsure of what her baseline status is as no family is present. Recommend HHOT and 24 hour supervision at discharge.   Follow Up Recommendations  Home health OT;Supervision/Assistance - 24 hour    Equipment Recommendations  3 in 1 bedside comode;Tub/shower bench    Recommendations for Other Services      Precautions / Restrictions Precautions Precautions: Fall Precaution Comments: Bone flap in R abdomen.       Mobility Bed Mobility Overal bed mobility: Modified Independent                Transfers Overall transfer level: Needs assistance Equipment used: None Transfers: Sit to/from Stand;Stand Pivot Transfers Sit to Stand: Supervision Stand pivot transfers: Supervision       General transfer comment: Pt ambulated in room with supervision with no AD     Balance     Sitting balance-Leahy Scale: Good     Standing balance support: During functional activity;No upper extremity supported Standing balance-Leahy Scale: Good                     ADL Overall ADL's : Needs assistance/impaired Eating/Feeding: Independent   Grooming: Wash/dry hands;Wash/dry face;Supervision/safety;Standing   Upper Body Bathing: Set up;Sitting   Lower Body Bathing: Supervison/ safety;Sit to/from stand   Upper Body Dressing : Set up;Sitting   Lower Body Dressing:  Supervision/safety;Sit to/from stand   Toilet Transfer: Supervision/safety;Ambulation;Comfort height toilet;BSC   Toileting- Clothing Manipulation and Hygiene: Supervision/safety;Sit to/from stand       Functional mobility during ADLs: Supervision/safety General ADL Comments: Pt requires encouragement to participate, stating she was sleeping.  With coaxing, she was agreeable to working in the room       Vision Eye Alignment: Within Functional Limits   Ocular Range of Motion: Within Functional Limits Tracking/Visual Pursuits: Able to track stimulus in all quads without difficulty         Additional Comments: Pt at times, provided inconsistencies with visual field testing, but with retesting, no definitive deficit noted    Perception     Praxis      Cognition   Behavior During Therapy: WFL for tasks assessed/performed Overall Cognitive Status: No family/caregiver present to determine baseline cognitive functioning                  General Comments: Unsure of pt's baseline    Extremity/Trunk Assessment               Exercises     Shoulder Instructions       General Comments      Pertinent Vitals/ Pain       Pain Assessment: Faces Faces Pain Scale: Hurts little more Pain Location: Rt cheek Pain Descriptors / Indicators: Aching Pain Intervention(s): Monitored during session  Home Living  Prior Functioning/Environment              Frequency Min 3X/week     Progress Toward Goals  OT Goals(current goals can now be found in the care plan section)  Progress towards OT goals: Progressing toward goals     Plan Discharge plan needs to be updated    Co-evaluation                 End of Session Equipment Utilized During Treatment: Gait belt   Activity Tolerance Patient tolerated treatment well   Patient Left in bed;with call bell/phone within reach   Nurse Communication  Mobility status        Time: [id number] OT Time Calculation (min): 19 min  Charges: OT General Charges $OT Visit: 1 Procedure OT Treatments $Self Care/Home Management : 8-22 mins  [name] M 08/24/2015, 12:38 PM

## 2015-08-24 NOTE — Progress Notes (Signed)
Patient ID: [name], female   DOB: July 09, 1955, 60 y.o.   MRN: [id number] BP 92/47 mmHg  Pulse 82  Temp(Src) 99 F (37.2 C) (Oral)  Resp 20  Ht 5\' 8"  (1.727 m)  Wt 118 kg (260 lb 2.3 oz)  BMI 39.56 kg/m2  SpO2 98% Alert and oriented x 4, speech is clear Right jaw pain due to the parotid appears slightly larger, more firm. Will continue antibiotics, if no improvement tomorrow, will call Dr. Rulon Abide.  Not ready for discharge.  Cranial wound is clean, dry, no signs of infection.

## 2015-08-25 LAB — BASIC METABOLIC PANEL
ANION GAP: 10 (ref 5–15)
BUN: 5 mg/dL — ABNORMAL LOW (ref 6–20)
CO2: 24 mmol/L (ref 22–32)
Calcium: 8.3 mg/dL — ABNORMAL LOW (ref 8.9–10.3)
Chloride: 103 mmol/L (ref 101–111)
Creatinine, Ser: 0.68 mg/dL (ref 0.44–1.00)
GFR calc Af Amer: >60 mL/min (ref >60)
GLUCOSE: 105 mg/dL — AB (ref 65–99)
POTASSIUM: 4 mmol/L (ref 3.5–5.1)
Sodium: 137 mmol/L (ref 135–145)

## 2015-08-25 MED ORDER — CLINDAMYCIN PHOSPHATE 900 MG/50ML IV SOLN
900.0000 mg | Freq: Three times a day (TID) | INTRAVENOUS | Status: DC
  Administered 2015-08-25 – 2015-08-28 (×8): 900 mg via INTRAVENOUS
  Filled 2015-08-25 (×10): qty 50

## 2015-08-25 NOTE — Progress Notes (Signed)
Pharmacy Antibiotic Note  [name] is a 60 y.o. female admitted on 08/16/2015 for craniectomy for evacuation of SDH.   Pharmacy consulted for vancomycin dosing on 2/14 for parotitis.   Day # 3 vanc/clinda for R sided acute parotitis, slowly improving.  Creat WNL. Afebrile, no culture data.  She got 9 days of flagyl for BV.  Plan: continueVancomycin 1000 mg IV every 12 hours.  Goal trough 15-20 mcg/mL.  Increase clinida to 900 q8h  Height: 5\' 8"  (172.7 cm) Weight: 260 lb 2.3 oz (118 kg) IBW/kg (Calculated) : 63.9  Temp (24hrs), Avg:98.8 F (37.1 C), Min:98.4 F (36.9 C), Max:99.3 F (37.4 C)   Recent Labs Lab 08/25/15 0524  CREATININE 0.68    Estimated Creatinine Clearance: 103.5 mL/min (by C-G formula based on Cr of 0.68).    Allergies  Allergen Reactions  . Ciprofloxacin Itching  . Dilaudid [Hydromorphone Hcl] Itching  . Hydromorphone Itching  . Ibuprofen Other (See Comments)    Other reaction(s): Other (See Comments) Cannot take because of liver disease Cannot take because of liver disease   . Sulfa Antibiotics Hives  . Zofran [Ondansetron Hcl] Itching    Other reaction(s): Other (See Comments) Do not use; vomiting per pt  . Zofran [Ondansetron] Other (See Comments)    Do not use; vomiting per pt  . Amoxicillin Rash  . Chocolate Rash  . Fentanyl Rash  . Metformin Nausea Only  . Penicillins Rash and Other (See Comments)    Has patient had a PCN reaction causing immediate rash, facial/tongue/throat swelling, SOB or lightheadedness with hypotension: [id number] Has patient had a PCN reaction causing severe rash involving mucus membranes or skin necrosis: [id number]} Has patient had a PCN reaction that required hospitalization [id number] Has patient had a PCN reaction occurring within the last 10 years: [id number] If all of the above answers are "NO", then may proceed with Cephalosporin use.   . Strawberry (Diagnostic) Rash  . Strawberry Extract  Rash    Antimicrobials this admission: 2/15 vancomycin >>  2/15 clindamycin >>  2/15 metronidazole >>2/16 Dose adjustments this admission: Increase clinda doe 2/17 Microbiology results: 2/9 MRSA PCR: positive  Thank you for allowing [place] to participate in this patients care.  Eudelia Bunch, Pharm.D. [id number] 08/25/2015 12:34 PM

## 2015-08-25 NOTE — Consult Note (Signed)
   [facility] CM Inpatient Consult   08/25/2015  Leanne Rametta 01/25/1956 [id number]   Patient screened for [place] Management services. Went to bedside to speak with her and explain and offer [place] Management program. Also discussed how community [facility] RNCM has had unsuccessful attempts in reaching her. She pleasantly declines [facility] Care Management follow up. [place] is following her as well and that she does not need any additional programs. Appreciative of visit. She accepted [facility] brochure with contact information and disclosure. States she will call in future should she change her mind. Will make inpatient RNCM aware that patient declined [facility] services.   Marthenia Rolling, MSN-Ed, RN,BSN [facility] Liaison [phone]

## 2015-08-25 NOTE — Progress Notes (Signed)
Patient ID: [name], female   DOB: 25-Jun-1956, 60 y.o.   MRN: [id number] BP 111/66 mmHg  Pulse 87  Temp(Src) 98.6 F (37 C) (Oral)  Resp 20  Ht 5\' 8"  (1.727 m)  Wt 118 kg (260 lb 2.3 oz)  BMI 39.56 kg/m2  SpO2 96% Continues with antibiotics, parotid still quite firm.  Moving all extremities well No discharge until some resolution of the parotid, which has not occurred.  Wound is clean dry, and without signs of infection.

## 2015-08-25 NOTE — Progress Notes (Signed)
Patient ID: [name], female   DOB: [birth year], 60 y.o.   MRN: [id number]  She feels slightly better.   Continued firm swelling of the right parotid, without fluctuance.  Parotitis, slowly improving, continue Abx.

## 2015-08-25 NOTE — Progress Notes (Signed)
Physical Therapy Treatment Patient Details Name: [name] MRN: [id number] DOB: Jan 17, 1956 Today's Date: 08/25/2015    History of Present Illness pt presents after R Frontal-Temporal-Parietal Crani for Hematoma evacuation with bone flap in R Abdomen and Seizures.  pt with hx of Emphysema, Depression, Malignant Brain Tumor, Hep C, CKD, CVA, Seizures, Pancreatitis, Cirrhosis, Ascites, Rectovaginal Fistula, and hx Polysubstance.      PT Comments    Pt progressing towards physical therapy goals. Noted pt's preference to return home at d/c. Overall pt demonstrates decreased safety awareness and activity has been limited during therapy due to what she is agreeable to. Tolerance for functional activity is likely low at this point and would benefit from HHPT to follow upon d/c.   Follow Up Recommendations  Home health PT;Supervision for mobility/OOB     Equipment Recommendations  Rolling walker with 5" wheels    Recommendations for Other Services       Precautions / Restrictions Precautions Precautions: Fall Precaution Comments: Bone flap in R abdomen. Restrictions Weight Bearing Restrictions: No    Mobility  Bed Mobility Overal bed mobility: Modified Independent Bed Mobility: Supine to Sit;Sit to Supine           General bed mobility comments: No assist required.   Transfers Overall transfer level: Needs assistance Equipment used: None;Rolling walker (2 wheeled) Transfers: Sit to/from Stand Sit to Stand: Supervision         General transfer comment: Supervision for safety both with and without walker. Pt impulsive with transfers at times even when cued to wait for therapist.   Ambulation/Gait Ambulation/Gait assistance: Min guard Ambulation Distance (Feet): 25 Feet Assistive device: Rolling walker (2 wheeled);None Gait Pattern/deviations: Step-through pattern;Decreased stride length Gait velocity: Decreased Gait velocity interpretation: Below normal speed  for age/gender General Gait Details: Pt only agreeable to ambulate in the room (to bathroom and back). With walker pt appears safer however pt walked from the sink back to the bed without the walker and without physical assist.    Stairs            Wheelchair Mobility    Modified Rankin (Stroke Patients Only)       Balance Overall balance assessment: Needs assistance Sitting-balance support: Feet supported;No upper extremity supported Sitting balance-Leahy Scale: Good     Standing balance support: No upper extremity supported;During functional activity Standing balance-Leahy Scale: Good                      Cognition Arousal/Alertness: Awake/alert Behavior During Therapy: WFL for tasks assessed/performed Overall Cognitive Status: No family/caregiver present to determine baseline cognitive functioning                      Exercises      General Comments        Pertinent Vitals/Pain Pain Assessment: Faces Faces Pain Scale: Hurts little more Pain Location: R cheek Pain Descriptors / Indicators: Headache Pain Intervention(s): Limited activity within patient's tolerance;Monitored during session;Repositioned    Home Living Family/patient expects to be discharged to:: Private residence                    Prior Function            PT Goals (current goals can now be found in the care plan section) Acute Rehab PT Goals Patient Stated Goal: feel better PT Goal Formulation: With patient Time For Goal Achievement: 09/02/15 Potential to Achieve Goals: Good Progress towards PT  goals: Progressing toward goals    Frequency  Min 3X/week    PT Plan Discharge plan needs to be updated    Co-evaluation             End of Session Equipment Utilized During Treatment: Gait belt Activity Tolerance: Patient limited by pain Patient left: in bed;with bed alarm set;with call bell/phone within reach     Time: 1430-1449 PT Time Calculation  (min) (ACUTE ONLY): 19 min  Charges:  $Gait Training: 8-22 mins                    G Codes:      Rolinda Roan 2015/08/31, 3:10 PM  Rolinda Roan, PT, DPT Acute Rehabilitation Services Pager: [phone]

## 2015-08-26 NOTE — Progress Notes (Signed)
Patient ID: [name], female   DOB: Aug 21, 1955, 60 y.o.   MRN: [id number] Stable, no complains.

## 2015-08-27 LAB — HCV RNA QUANT: HCV QUANT: NOT DETECTED [IU]/mL (ref >50)

## 2015-08-27 IMAGING — CT CT ABD-PELV W/ CM
2 of 5 series · 15 of 46 positions shown, 17 images · IV contrast (isovue)
Comparison: CT ABD-PELV W/ CM dated 09/02/2013; CT ABD-PELV W/ CM
dated 06/09/2013; CT ABD-PELV W/ CM dated 03/27/2012

CLINICAL DATA: Colovaginal fistula.

EXAM:
CT ABDOMEN AND PELVIS WITH CONTRAST
TECHNIQUE: Multidetector CT imaging of the abdomen and pelvis was performed
using the standard protocol following bolus administration of
intravenous contrast.
CONTRAST:  125 cc Isovue 370.  Rectal contrast.

[Series 2: routine abd pel with · axial · 0.81mm/px · z∈[+319,+799]mm · 12 of 108 slices shown, 14 images]
[im 6/108  soft-tissue]
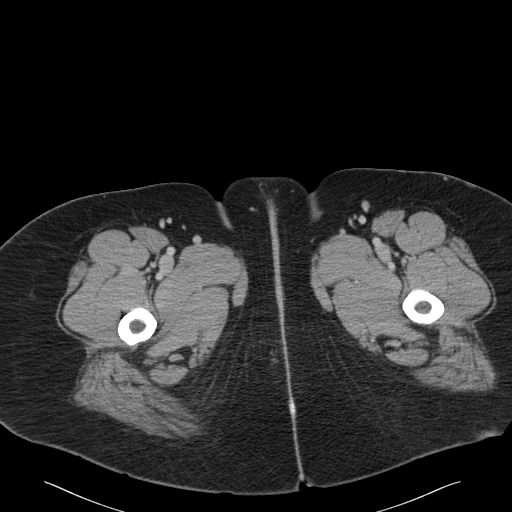
[im 6/108  bone]
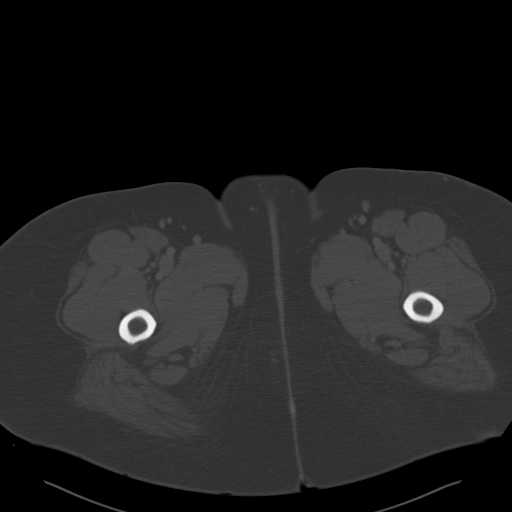
[im 17/108  soft-tissue]
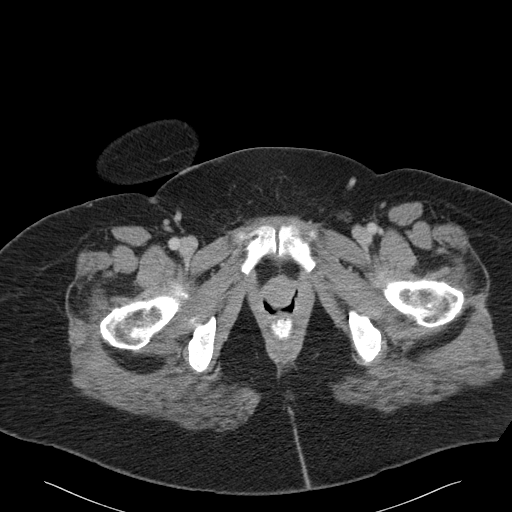
[im 22/108  soft-tissue]
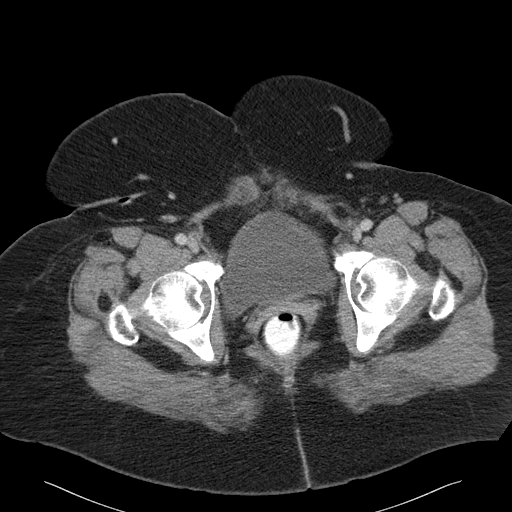
[im 33/108  soft-tissue]
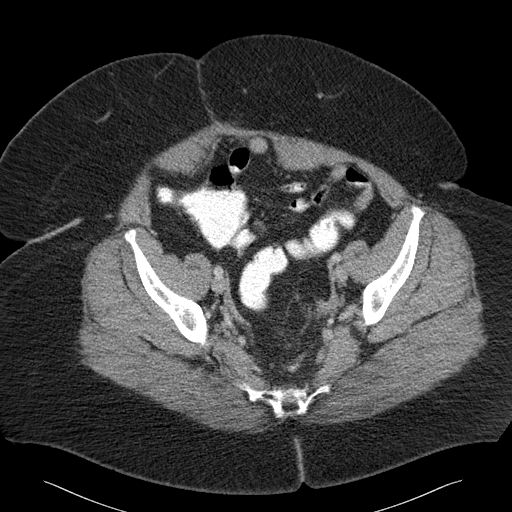
[im 43/108  soft-tissue]
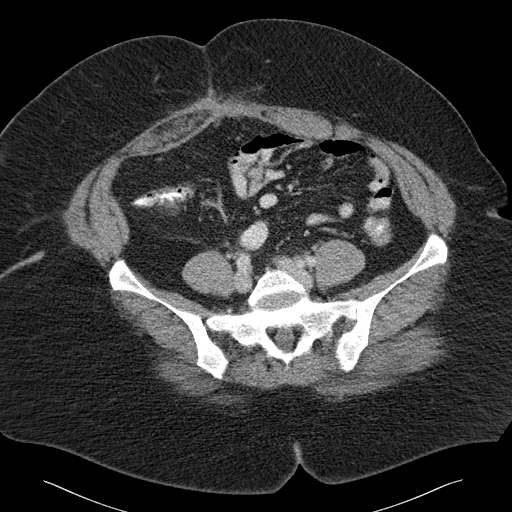
[im 49/108  soft-tissue]
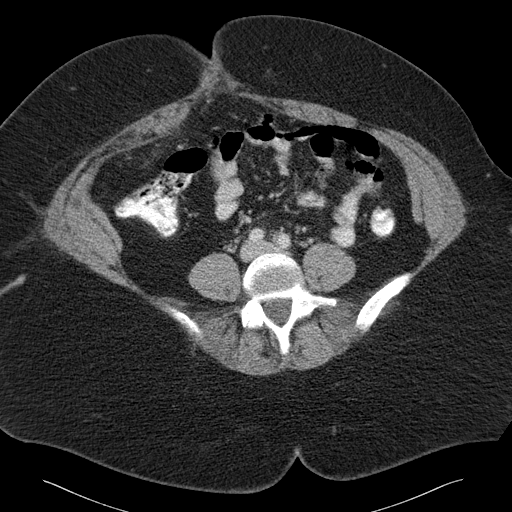
[im 59/108  soft-tissue]
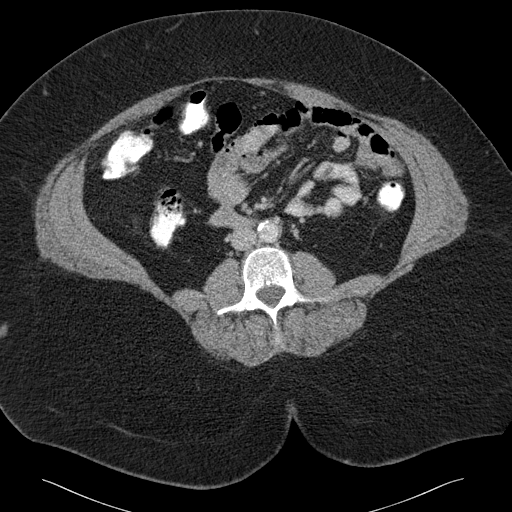
[im 65/108  soft-tissue]
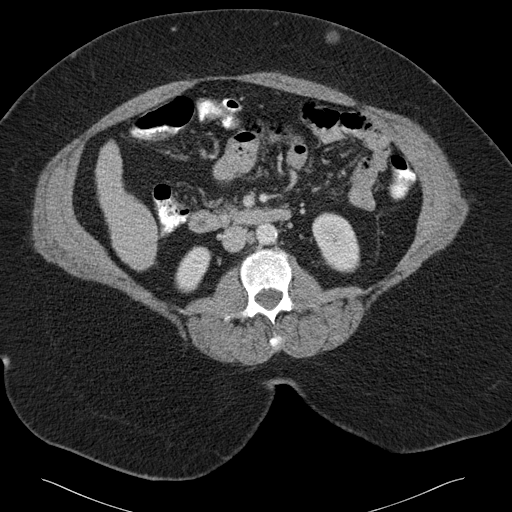
[im 75/108  soft-tissue]
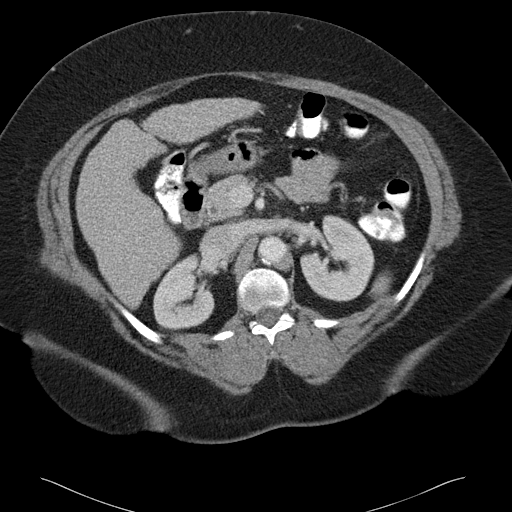
[im 75/108  bone]
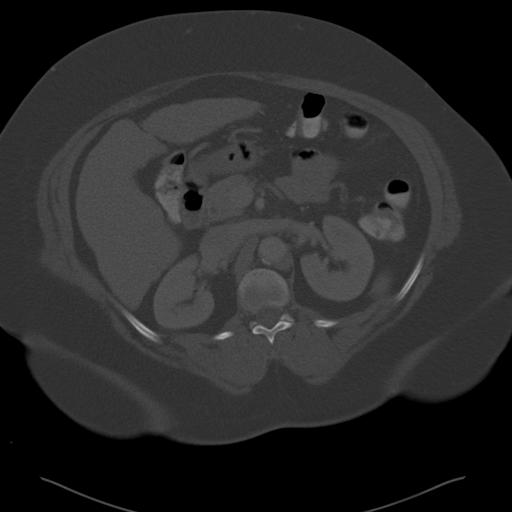
[im 86/108  soft-tissue]
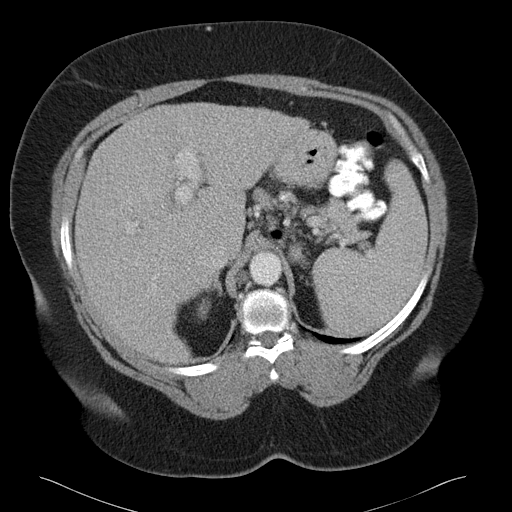
[im 91/108  soft-tissue]
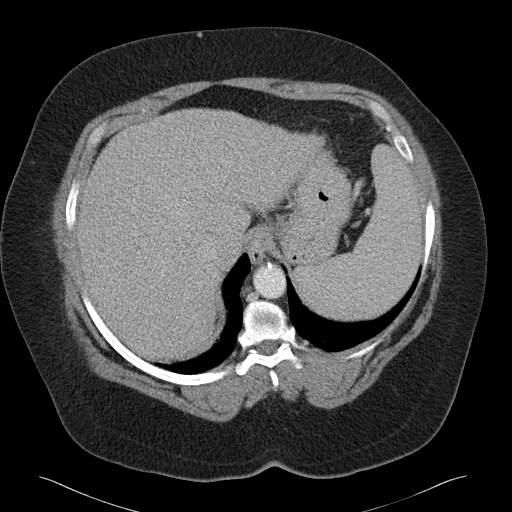
[im 102/108  soft-tissue]
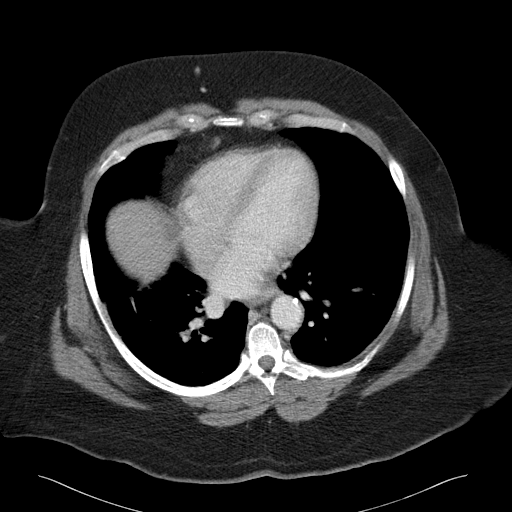

[Series 6: cor routine abd pel with · coronal · 0.75mm/px · 3 of 190 slices shown]
[im 64/190  soft-tissue]
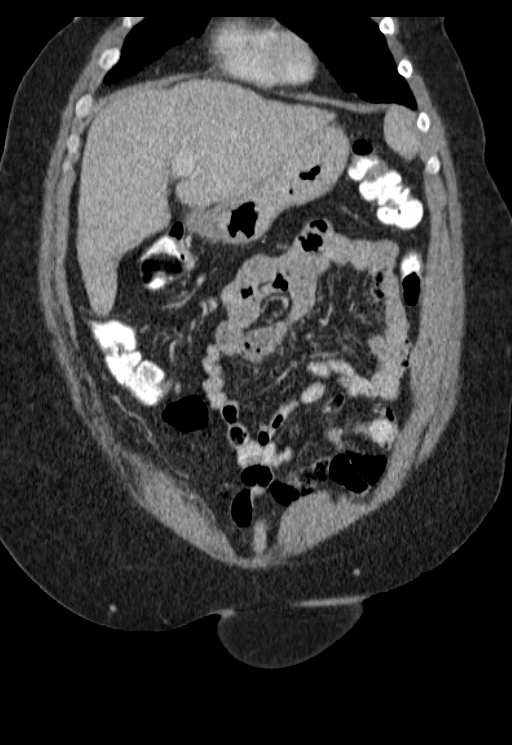
[im 85/190  soft-tissue]
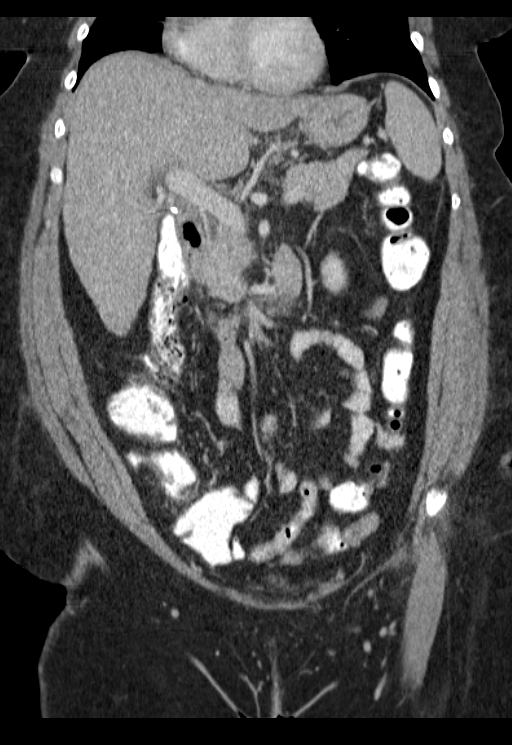
[im 106/190  soft-tissue]
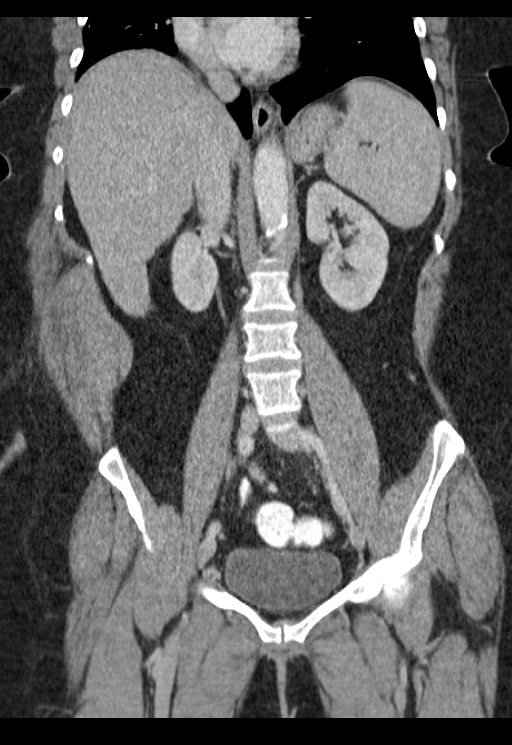

[15 of 46 positions shown; findings below may reference images not displayed]

FINDINGS: Irregular nodular liver noted consistent with cirrhosis. Mild
splenic prominence noted. Small perigastric and periesophageal
varices cannot be excluded. Small retroperitoneal varices cannot be
excluded. Portal vein is patent . Pancreas is normal. No biliary
distention. Surgical clips in the right hilum consistent prior
cholecystectomy.

Stable left adrenal nodule consistent with adenoma. Kidneys are
unremarkable. No hydronephrosis or hydroureter. No evidence of
obstructing ureteral stone. Bladder is nondistended. No free pelvic
fluid collections are noted. Hysterectomy. No evidence of contrast
noted in the vagina following administration of rectal contrast. No
free pelvic fluid collections. No adnexal masses. Surgical clips in
pelvis.

No significant adenopathy. Atherosclerotic vascular changes noted of
the abdominal aorta. Visceral vessels are patent.

No inflammatory changes in either right or left lower quadrant.
There is no bowel distention. No free air identified. No mesenteric
masses.

Nodular density noted in the anterior abdominal wall consistent with
injection sites. Surgical scarring anterior abdominal wall. No
significant hernia. Cardiomegaly. Mild basilar atelectasis. No acute
bony abnormality.
IMPRESSION: 1. Cirrhosis with portal hypertension/varices.  Portal vein patent.
2. Following administration of rectal contrast no contrast could be
demonstrated in the vagina to suggest a colovaginal fistula.

## 2015-08-27 NOTE — Progress Notes (Signed)
Patient ID: [name], female   DOB: Nov 22, 1955, 60 y.o.   MRN: [id number] Vital signs are stable Parotid gland is still firm Neurologically remained stable Continue observation in hospital

## 2015-08-28 MED ORDER — CLINDAMYCIN HCL 300 MG PO CAPS
300.0000 mg | ORAL_CAPSULE | Freq: Three times a day (TID) | ORAL | Status: DC
  Administered 2015-08-28 – 2015-08-30 (×6): 300 mg via ORAL
  Filled 2015-08-28 (×8): qty 1

## 2015-08-28 MED ORDER — CLINDAMYCIN HCL 300 MG PO CAPS: 300.0000 mg | ORAL_CAPSULE | Freq: Three times a day (TID) | ORAL | Status: DC

## 2015-08-28 NOTE — Progress Notes (Signed)
Physical Therapy Treatment Patient Details Name: [name] MRN: [id number] DOB: 10/28/1955 Today's Date: 08/28/2015    History of Present Illness pt presents after fall and underwent R Frontal-Temporal-Parietal Crani for Hematoma evacuation with bone flap in R Abdomen and Seizures.  pt with hx of Emphysema, Depression, Malignant Brain Tumor, Hep C, CKD, CVA, Seizures, Pancreatitis, Cirrhosis, Ascites, Rectovaginal Fistula, and hx Polysubstance.      PT Comments    Patient up in room alone with clothes on floor (falling out of plastic bag in her hands) and did not see the fall risk in this situation. Patient desperate to go home and states between her aide, her mother, her husband, and mulitple other family members she will have 24/7 supervision. Pt pressing on the site of her craniotomy and asking why it feels so soft. Educated on procedure and high risk of brain damage if she falls. ? If patient is appropriate for a protective helmet until her bone flap is replaced?   Follow Up Recommendations  Home health PT;Supervision for mobility/OOB; ? If patient is appropriate for a protective helmet until her bone flap is replaced?      Equipment Recommendations  Rolling walker with 5" wheels    Recommendations for Other Services       Precautions / Restrictions Precautions Precautions: Fall Precaution Comments: Bone flap in R abdomen. Restrictions Weight Bearing Restrictions: No    Mobility  Bed Mobility                  Transfers Overall transfer level: Needs assistance Equipment used: None;Rolling walker (2 wheeled) Transfers: Sit to/from Stand Sit to Stand: Supervision         General transfer comment: Supervision for safety both with and without walker (ex. on arrival, pt standing at closet trying to put clothes in a bag with clothing all over the floor around her--high fall risk)  Ambulation/Gait Ambulation/Gait assistance: Min assist Ambulation Distance  (Feet): 140 Feet (standing rest break x 2) Assistive device: Rolling walker (2 wheeled);None Gait Pattern/deviations: Step-through pattern;Decreased stride length;Staggering right;Drifts right/left Gait velocity: Decreased   General Gait Details: in room began with no RW, no HHA. Pt with stagger step as approached the door and volunteered to use RW to walk in the hall.   Stairs Stairs: Yes Stairs assistance: Min assist Stair Management: One rail Right;Step to pattern;Forwards;Sideways Number of Stairs: 3 General stair comments: pt ascended forwards; descended sideways; used rail but does not have a rail at home  Wheelchair Mobility    Modified Rankin (Stroke Patients Only)       Balance     Sitting balance-Leahy Scale: Good       Standing balance-Leahy Scale: Good                      Cognition Arousal/Alertness: Awake/alert Behavior During Therapy: Restless Overall Cognitive Status: No family/caregiver present to determine baseline cognitive functioning   Orientation Level:  (fully oriented to situation; time not tested) Current Attention Level: Selective     Safety/Judgement: Decreased awareness of safety;Decreased awareness of deficits          Exercises      General Comments        Pertinent Vitals/Pain Pain Assessment: No/denies pain    Home Living                      Prior Function  PT Goals (current goals can now be found in the care plan section) Acute Rehab PT Goals Patient Stated Goal: go home Time For Goal Achievement: 09/02/15 Progress towards PT goals: Progressing toward goals (distance, but not level of assistance)    Frequency  Min 3X/week    PT Plan Current plan remains appropriate    Co-evaluation             End of Session Equipment Utilized During Treatment: Gait belt Activity Tolerance: Patient tolerated treatment well Patient left: with call bell/phone within reach;in chair;with chair  alarm set     Time: 1345-1417 PT Time Calculation (min) (ACUTE ONLY): 32 min  Charges:  $Gait Training: 23-37 mins                    G Codes:      Misty Rago 09-23-15, 2:39 PM Pager [phone]

## 2015-08-28 NOTE — Progress Notes (Signed)
Pharmacy Antibiotic Note  [name] is a 59 y.o. female admitted on 08/16/2015 for craniectomy for evacuation of SDH.   Pharmacy consulted for vancomycin dosing on 2/14 for parotitis.   Day # 3 vanc/clinda for R sided acute parotitis, slowly improving.  Creat WNL. Afebrile, no culture data.  She got 9 days of flagyl for BV.  Plan: Continue Vancomycin 1000 mg IV every 12 hours.  Goal trough 15-20 mcg/mL.  Noted change to oral clindamycin - consider d/c vancomycin soon?  Height: 5\' 8"  (172.7 cm) Weight: 260 lb 2.3 oz (118 kg) IBW/kg (Calculated) : 63.9  Temp (24hrs), Avg:98.1 F (36.7 C), Min:97.5 F (36.4 C), Max:98.4 F (36.9 C)   Recent Labs Lab 08/25/15 0524  CREATININE 0.68    Estimated Creatinine Clearance: 103.5 mL/min (by C-G formula based on Cr of 0.68).    Allergies  Allergen Reactions  . Ciprofloxacin Itching  . Dilaudid [Hydromorphone Hcl] Itching  . Hydromorphone Itching  . Ibuprofen Other (See Comments)    Other reaction(s): Other (See Comments) Cannot take because of liver disease Cannot take because of liver disease   . Sulfa Antibiotics Hives  . Zofran [Ondansetron Hcl] Itching    Other reaction(s): Other (See Comments) Do not use; vomiting per pt  . Zofran [Ondansetron] Other (See Comments)    Do not use; vomiting per pt  . Amoxicillin Rash  . Chocolate Rash  . Fentanyl Rash  . Metformin Nausea Only  . Penicillins Rash and Other (See Comments)    Has patient had a PCN reaction causing immediate rash, facial/tongue/throat swelling, SOB or lightheadedness with hypotension: [id number] Has patient had a PCN reaction causing severe rash involving mucus membranes or skin necrosis: [id number]} Has patient had a PCN reaction that required hospitalization [id number] Has patient had a PCN reaction occurring within the last 10 years: [id number] If all of the above answers are "NO", then may proceed with Cephalosporin use.   . Strawberry  (Diagnostic) Rash  . Strawberry Extract Rash    Antimicrobials this admission: 2/15 vancomycin >>  2/15 clindamycin >>  2/15 metronidazole >>2/16  Dose adjustments this admission: Increase clinda doe 2/17  Microbiology results: 2/9 MRSA PCR: positive  Thank you for allowing [place] to participate in this patients care.  Uvaldo Rising, BCPS  Clinical Pharmacist Pager [phone]  08/28/2015 10:41 AM

## 2015-08-28 NOTE — Progress Notes (Signed)
Patient ID: [name], female   DOB: 02/23/1956, 60 y.o.   MRN: [id number] BP 104/68 mmHg  Pulse 80  Temp(Src) 98.4 F (36.9 C) (Oral)  Resp 20  Ht 5\' 8"  (1.727 m)  Wt 118 kg (260 lb 2.3 oz)  BMI 39.56 kg/m2  SpO2 94% Alert and oriented x 4 No documentation of emesis today, patient states she is nauseated and threw up 3 times.  Will plan on discharge tomorrow.  Now on cleocin and to continue at discharge.

## 2015-08-28 NOTE — Progress Notes (Signed)
Patient ID: [name], female   DOB: 02-26-1956, 60 y.o.   MRN: [id number]  Doing better, parotid less tender. Transition to oral Abx, I can follow up as an out patient in 2 weeks.

## 2015-08-29 MED ORDER — OXYCODONE HCL ER 20 MG PO T12A: 20.0000 mg | EXTENDED_RELEASE_TABLET | Freq: Two times a day (BID) | ORAL | Status: DC

## 2015-08-29 MED ORDER — OXYCODONE HCL 5 MG PO TABS: 5.0000 mg | ORAL_TABLET | Freq: Four times a day (QID) | ORAL | Status: DC | PRN

## 2015-08-29 NOTE — Progress Notes (Signed)
OT Cancellation Note  Patient Details Name: [name] MRN: [id number] DOB: 07-Nov-1955   Cancelled Treatment:    Reason Eval/Treat Not Completed: Other (comment) Pt stating she doesn't feel well and asking for therapist to return. Will see in am. [place], OTR/L  [phone] 08/29/2015 08/29/2015, 5:48 PM

## 2015-08-29 NOTE — Discharge Instructions (Signed)
Subdural Hematoma A subdural hematoma is a collection of blood between the brain and its tough outermost membrane covering (the dura). Blood clots that form in this area push down on the brain and cause irritation. A subdural hematoma may cause parts of the brain to stop working and eventually cause death.  CAUSES A subdural hematoma is caused by bleeding from a ruptured blood vessel (hemorrhage). The bleeding results from trauma to the head, such as from a fall or motor vehicle accident. There are two types of subdural hemorrhages:  Acute. This type develops shortly after a serious blow to the head and causes blood to collect very quickly. If not diagnosed and treated promptly, severe brain injury or death can occur.  Chronic. This is when bleeding develops more slowly, over weeks or months. RISK FACTORS People at risk for subdural hematoma include older persons, infants, and alcoholics. SYMPTOMS An acute subdural hemorrhage develops over minutes to hours. Symptoms can include:  Temporary loss of consciousness.  Weakness of arms or legs on one side of the body.  Changes in vision or speech.  A severe headache.  Seizures.  Nausea and vomiting.  Increased sleepiness. A chronic subdural hemorrhage develops over weeks to months. Symptoms may develop slowly and produce less noticeable problems or changes. Symptoms include:  A mild headache.  A change in personality.  Loss of balance or difficulty walking.  Weakness, numbness, or tingling in the arms or legs.  Nausea or vomiting.  Memory loss.  Double vision.  Increased sleepiness. DIAGNOSIS Your health care provider will perform a thorough physical and neurological exam. A CT scan or MRI may also be done. If there is blood on the scan, its color will help your health care provider determine how long the hemorrhage has been there. TREATMENT If the cause is an acute subdural hemorrhage, immediate treatment is needed. In many  cases an emergency surgery is performed to drain accumulated blood or to remove the blood clot. Sometimes steroid or diuretic medicines or controlled breathing through a ventilator is needed to decrease pressure in the brain. This is especially true if there is any swelling of the brain. If the cause is a chronic subdural hemorrhage, treatment depends on a variety of factors. Sometimes no treatment is needed. If the subdural hematoma is small and causes minimal or no symptoms, you may be treated with bed rest, medicines, and observation. If the hemorrhage is large or if you have neurological symptoms, an emergency surgery is usually needed to remove the blood clot. People who develop a subdural hemorrhage are at risk of developing seizures, even after the subdural hematoma has been treated. You may be prescribed an anti-seizure (anticonvulsant) medicine for a year or longer. HOME CARE INSTRUCTIONS  Only take medicines as directed by your health care provider.  Rest if directed by your health care provider.  Keep all follow-up appointments with your health care provider.  If you play a contact sport such as football, hockey or soccer and you experienced a significant head injury, allow enough time for healing (up to 15 days) before you start playing again. A repeated injury that occurs during this fragile repair period is likely to result in hemorrhage. This is called the second impact syndrome. SEEK IMMEDIATE MEDICAL CARE IF:  You fall or experience minor trauma to your head and you are taking blood thinners. If you are on any blood thinners even a very small injury can cause a subdural hematoma. You should not hesitate to seek  You fall or experience minor trauma to your head and you are taking blood thinners. If you are on any blood thinners even a very small injury can cause a subdural hematoma. You should not hesitate to seek medical attention regardless of how minor you think your symptoms are.  · You experience a head injury and have:    Drowsiness or a decrease in alertness.    Confusion or forgetfulness.    Slurred speech.    Irrational or aggressive behavior.    Numbness or paralysis in any part  of the body.    A feeling of being sick to your stomach (nauseous) or you throw up (vomit).    Difficulty walking or poor coordination.    Double vision.    Seizures.    A bleeding disorder.    A history of heavy alcohol use.    Clear fluid draining from your nose or ears.    Personality changes.    Difficulty thinking.    Worsening symptoms.  MAKE SURE YOU:  · Understand these instructions.  · Will watch your condition.  · Will get help right away if you are not doing well or get worse.  FOR MORE INFORMATION  National Institute of Neurological Disorders and Stroke: www.ninds.nih.gov  American Association of Neurological Surgeons: www.neurosurgerytoday.org  American Academy of Neurology (AAN): www.aan.com  Brain Injury Association of America: www.biausa.org     This information is not intended to replace advice given to you by your health care provider. Make sure you discuss any questions you have with your health care provider.     Document Released: 05/11/2004 Document Revised: 04/14/2013 Document Reviewed: 12/25/2012  Elsevier Interactive Patient Education ©2016 Elsevier Inc.

## 2015-08-29 NOTE — Progress Notes (Signed)
Per Dr. Christella Noa, droplet precautions can be d/c.

## 2015-08-29 NOTE — Progress Notes (Signed)
Staples removed from head and abdomen, pt tolerated well.  Will continue to monitor.   Fredrich Romans, RN

## 2015-08-29 NOTE — Progress Notes (Signed)
Patient ID: [name], female   DOB: 1956/02/16, 60 y.o.   MRN: [id number] BP 97/54 mmHg  Pulse 80  Temp(Src) 97.5 F (36.4 C) (Oral)  Resp 20  Ht 5\' 8"  (1.727 m)  Wt 118 kg (260 lb 2.3 oz)  BMI 39.56 kg/m2  SpO2 93% Alert and oriented x 4 Will discharge tomorrow. Stated today she did not feel well.  Wound is clean, dry, no signs of infection.

## 2015-08-30 LAB — BASIC METABOLIC PANEL WITH GFR
Anion gap: 8 (ref 5–15)
BUN: <5 mg/dL — ABNORMAL LOW (ref 6–20)
CO2: 24 mmol/L (ref 22–32)
Calcium: 8.5 mg/dL — ABNORMAL LOW (ref 8.9–10.3)
Chloride: 105 mmol/L (ref 101–111)
Creatinine, Ser: 0.69 mg/dL (ref 0.44–1.00)
GFR calc Af Amer: >60 mL/min
GFR calc non Af Amer: >60 mL/min
Glucose, Bld: 103 mg/dL — ABNORMAL HIGH (ref 65–99)
Potassium: 3.9 mmol/L (ref 3.5–5.1)
Sodium: 137 mmol/L (ref 135–145)

## 2015-08-30 NOTE — Discharge Summary (Signed)
Physician Discharge Summary  Patient ID: [name] MRN: [id number] DOB/AGE: [birth year] 60 y.o.  Admit date: 08/16/2015 Discharge date: 08/30/2015  Admission Diagnoses:subdural hematoma Left  Discharge Diagnoses: same Active Problems:   Subdural hematoma [facility])   Discharged Condition: good  Hospital Course: [name] was admitted and taken to the operating room for an uncomplicated craniectomy and subdural hematoma evacuation. She tolerated her procedure well. The cranial wound is clean dry, and without signs of infection at discharge. [name] developed parotitis soon after the operation, which was treated with IV antibiotics(vancomycin), and currently with cleocin. The abdominal incision where the skull flap was placed is clean, dry, and without signs of infection. She ambulates, voids, and tolerates a regular diet.   Treatments: surgery: as above  Discharge Exam: Blood pressure 102/52, pulse 84, temperature 98.1 F (36.7 C), temperature source Axillary, resp. rate 20, height 5\' 8"  (1.727 m), weight 118 kg (260 lb 2.3 oz), SpO2 95 %. General appearance: alert, cooperative, appears stated age, mild distress and morbidly obese  Disposition: 02-Short Term Hospital Subdural Hematoma    Medication List    TAKE these medications        ADVAIR DISKUS 500-50 MCG/DOSE Aepb  Generic drug:  Fluticasone-Salmeterol  inhale 1 dose by mouth twice a day     amitriptyline 50 MG tablet  Commonly known as:  ELAVIL  Take 1 tablet (50 mg total) by mouth at bedtime.     Bismuth Subsalicylate [id number] Susp  Commonly known as:  KAOPECTATE EXTRA STRENGTH  Take 15 mLs (525 mg total) by mouth every 2 (two) hours as needed.     cefUROXime 500 MG tablet  Commonly known as:  CEFTIN  Take 1 tablet (500 mg total) by mouth 2 (two) times daily with a meal.     clindamycin 300 MG capsule  Commonly known as:  CLEOCIN  Take 1 capsule (300 mg total) by mouth 3 (three) times daily.     CREON 12000 units Cpep capsule  Generic drug:  lipase/protease/amylase  Take 24,000 Units by mouth 3 (three) times daily before meals.     fluconazole 150 MG tablet  Commonly known as:  DIFLUCAN  Take 1 tablet by mouth weekly     furosemide 20 MG tablet  Commonly known as:  LASIX  Take 20 mg by mouth daily.     gabapentin 300 MG capsule  Commonly known as:  NEURONTIN  Take 300 mg by mouth 3 (three) times daily.     hydrocortisone cream 0.5 %  Apply 1 application topically 2 (two) times daily as needed for itching.     Ipratropium-Albuterol 20-100 MCG/ACT Aers respimat  Commonly known as:  COMBIVENT  Inhale into the lungs.     lactulose 10 GM/15ML solution  Commonly known as:  CHRONULAC  Take 20 g by mouth daily as needed for mild constipation.     levETIRAcetam 500 MG tablet  Commonly known as:  KEPPRA  Take 1 tablet (500 mg total) by mouth 2 (two) times daily.     meloxicam 15 MG tablet  Commonly known as:  MOBIC  Take 1 tablet (15 mg total) by mouth 2 (two) times daily as needed for pain.     metoprolol succinate 25 MG 24 hr tablet  Commonly known as:  TOPROL-XL  Take 1 tablet (25 mg total) by mouth daily.     metroNIDAZOLE 500 MG tablet  Commonly known as:  FLAGYL  Take 1 tablet (500 mg total)  by mouth 3 (three) times daily.     NASACORT ALLERGY 24HR 55 MCG/ACT Aero nasal inhaler  Generic drug:  triamcinolone  Place 2 sprays into the nose daily as needed (allergies).     omeprazole 20 MG capsule  Commonly known as:  PRILOSEC  Take 1 capsule (20 mg total) by mouth daily.     Oxycodone HCl 20 MG Tabs  Take 1 tablet by mouth every 8 (eight) hours as needed.     oxyCODONE 20 mg 12 hr tablet  Commonly known as:  OXYCONTIN  Take 1 tablet (20 mg total) by mouth every 12 (twelve) hours.     oxyCODONE 5 MG immediate release tablet  Commonly known as:  Oxy IR/ROXICODONE  Take 1 tablet (5 mg total) by mouth every 6 (six) hours as needed for moderate pain.      pravastatin 10 MG tablet  Commonly known as:  PRAVACHOL  Take 10 mg by mouth daily.     PROAIR HFA 108 (90 Base) MCG/ACT inhaler  Generic drug:  albuterol  Inhale 2 puffs into the lungs every 4 (four) hours as needed for wheezing.     sertraline 50 MG tablet  Commonly known as:  ZOLOFT  Take 100 mg by mouth daily.     tiotropium 18 MCG inhalation capsule  Commonly known as:  SPIRIVA  Place 18 mcg into inhaler and inhale 2 (two) times daily as needed (shortness of breath).     zolpidem 10 MG tablet  Commonly known as:  AMBIEN  Take 1 tablet (10 mg total) by mouth at bedtime.           Follow-up Information    Follow up with Izora Gala, MD. Schedule an appointment as soon as possible for a visit in 2 weeks.   Specialty:  Otolaryngology   Contact information:   [address] [place]  [address] [phone]       Follow up with Winfield Cunas, MD.   Specialty:  Neurosurgery   Why:  will schedule you for admission in ~ 3 weeks for bone flap replacement.    Contact information:   [address] [place] [address] [phone]       Signed: Winfield Cunas 08/30/2015, 9:12 AM

## 2015-08-30 NOTE — Progress Notes (Signed)
Physical Therapy Treatment Patient Details Name: [name] MRN: [id number] DOB: [birth year] Today's Date: 08/30/2015    History of Present Illness pt presents after fall and underwent R Frontal-Temporal-Parietal Crani for Hematoma evacuation with bone flap in R Abdomen and Seizures. Developed Rt parotitis 2/13 with Rt face swelling/pain. pt with hx of Emphysema, Depression, Malignant Brain Tumor, Hep C, CKD, CVA, Seizures, Pancreatitis, Cirrhosis, Ascites, Rectovaginal Fistula, and hx Polysubstance.      PT Comments     Patient more distracted today (also having incr pain Rt side of face--?contributing to incr distractedness). Less steady with RW and poor problem solving (i.e. Running into wall and table with inability to figure out how to correct and resume walking).   Again, feel patient would be a good candidate for protective head gear until her bone flap is replaced.   Follow Up Recommendations  Home health PT;Supervision for mobility/OOB     Equipment Recommendations  Rolling walker with 5" wheels;Other (comment) (?protective helmet)    Recommendations for Other Services       Precautions / Restrictions Precautions Precautions: Fall Precaution Comments: Bone flap in R abdomen. Restrictions Weight Bearing Restrictions: No    Mobility  Bed Mobility Overal bed mobility: Needs Assistance Bed Mobility: Sidelying to Sit   Sidelying to sit: Supervision (for safety due to decr cognition)          Transfers Overall transfer level: Needs assistance Equipment used: Rolling walker (2 wheeled) Transfers: Sit to/from Stand Sit to Stand: Min guard         General transfer comment: minguard due to poor safety with RW and high fall risk  Ambulation/Gait Ambulation/Gait assistance: Min assist Ambulation Distance (Feet): 250 Feet Assistive device: Rolling walker (2 wheeled) Gait Pattern/deviations: Step-through pattern;Decreased stride length;Drifts  right/left Gait velocity: Decreased   General Gait Details: drifts rt and left (especially when she turns her head); no awareness that RW was running into wall on her Lt side causing her to lose her balance   Stairs            Wheelchair Mobility    Modified Rankin (Stroke Patients Only)       Balance     Sitting balance-Leahy Scale: Good     Standing balance support: No upper extremity supported Standing balance-Leahy Scale: Fair Standing balance comment: less stable today; more distracted                    Cognition Arousal/Alertness: Awake/alert Behavior During Therapy: Flat affect Overall Cognitive Status: No family/caregiver present to determine baseline cognitive functioning Area of Impairment: Orientation;Attention;Memory;Safety/judgement;Awareness;Problem solving Orientation Level: Place (thought [facility] hospitals) Current Attention Level: Selective (internally distracted)     Safety/Judgement: Decreased awareness of safety;Decreased awareness of deficits Awareness:  (pre-intellectual re: bone flap and fallrisk) Problem Solving: Slow processing;Requires verbal cues General Comments: recalls why hospitalized yet cannot recall why having bone flap removed makes fall risk much more serious; walked into waiting room and pt unable to problem solve how to walk between coffee table and chairs with repeatedly running into the table     Exercises      General Comments        Pertinent Vitals/Pain Pain Assessment: Faces Faces Pain Scale: Hurts worst Pain Location: Rt lower jaw Pain Descriptors / Indicators: Crying Pain Intervention(s): Limited activity within patient's tolerance;Monitored during session;Ice applied    Home Living  Prior Function            PT Goals (current goals can now be found in the care plan section) Acute Rehab PT Goals Patient Stated Goal: go home Time For Goal Achievement: 09/02/15 Progress  towards PT goals: Progressing toward goals (distance met, not level of assistance)    Frequency  Min 3X/week    PT Plan Current plan remains appropriate    Co-evaluation             End of Session Equipment Utilized During Treatment: Gait belt Activity Tolerance: Patient limited by pain (tearful due to parotid pain) Patient left: with call bell/phone within reach;in chair;with chair alarm set     Time: 0823-0903 PT Time Calculation (min) (ACUTE ONLY): 40 min  Charges:  $Gait Training: 23-37 mins $Therapeutic Activity: 8-22 mins                    G Codes:      [name] September 01, 2015, 9:18 AM Pager [phone]

## 2015-08-30 NOTE — Progress Notes (Signed)
prescriptions need MD signature.  Prescriptions are in pt chart.

## 2015-08-30 NOTE — Progress Notes (Signed)
Patient still complaining of pains and also said the pain medication she is receiving is not helping with the pains. Pain medications not due at this time. MD's office called to notify

## 2015-08-30 NOTE — Care Management Note (Signed)
Case Management Note  Patient Details  Name: [name] MRN: [id number] Date of Birth: 1955/11/14  Subjective/Objective:                    Action/Plan: Patient being discharged home today with home health services. CM met with the patient and provided her a list of [facility] agencies in the [place] area. She selected [facility]. CM spoke with [facility] and faxed them the information they requested. Manuela Schwartz with [facility] called back and accepted the referral. Patient also ordered a 3 in 1 and rolling walker. Jermaine with [facility] DME notified and will deliver the equipment to the room. Per patients request CM spoke with Ms Durley' mother on the phone and informed her of the patients discharge and that Ms Toves will need transportation home. Ms Mccrae' mother stated she would come and pick her up. Pt also expressed concern about her discharge pain meds. Bedside RN informed and updated.  Expected Discharge Date:  08/21/15               Expected Discharge Plan:  [place]  In-House Referral:     Discharge planning Services  CM Consult  Post Acute Care Choice:  Durable Medical Equipment, Home Health Choice offered to:  Patient  DME Arranged:  3-N-1, Walker rolling DME Agency:  [place]:  PT, OT [facility] Agency:  [place]  Status of Service:  Completed, signed off  Medicare Important Message Given:    Date Medicare IM Given:    Medicare IM give by:    Date Additional Medicare IM Given:    Additional Medicare Important Message give by:     If discussed at [place] of Stay Meetings, dates discussed:    Additional Comments:  Pollie Friar, RN 08/30/2015, 1:14 PM

## 2015-08-30 NOTE — Progress Notes (Signed)
Patient being d/c home. D/c instructions given and patient verbalized understanding.

## 2015-09-04 DIAGNOSIS — Z8673 Personal history of transient ischemic attack (TIA), and cerebral infarction without residual deficits: Secondary | ICD-10-CM | POA: Diagnosis not present

## 2015-09-04 DIAGNOSIS — Z9181 History of falling: Secondary | ICD-10-CM | POA: Diagnosis not present

## 2015-09-04 DIAGNOSIS — Z7951 Long term (current) use of inhaled steroids: Secondary | ICD-10-CM | POA: Diagnosis not present

## 2015-09-04 DIAGNOSIS — I129 Hypertensive chronic kidney disease with stage 1 through stage 4 chronic kidney disease, or unspecified chronic kidney disease: Secondary | ICD-10-CM | POA: Diagnosis not present

## 2015-09-04 DIAGNOSIS — K219 Gastro-esophageal reflux disease without esophagitis: Secondary | ICD-10-CM | POA: Diagnosis not present

## 2015-09-04 DIAGNOSIS — J439 Emphysema, unspecified: Secondary | ICD-10-CM | POA: Diagnosis not present

## 2015-09-04 DIAGNOSIS — S065X0D Traumatic subdural hemorrhage without loss of consciousness, subsequent encounter: Secondary | ICD-10-CM | POA: Diagnosis not present

## 2015-09-04 DIAGNOSIS — N189 Chronic kidney disease, unspecified: Secondary | ICD-10-CM | POA: Diagnosis not present

## 2015-09-05 ENCOUNTER — Other Ambulatory Visit: Payer: Self-pay | Admitting: Neurosurgery

## 2015-09-05 DIAGNOSIS — N189 Chronic kidney disease, unspecified: Secondary | ICD-10-CM | POA: Diagnosis not present

## 2015-09-05 DIAGNOSIS — Z8673 Personal history of transient ischemic attack (TIA), and cerebral infarction without residual deficits: Secondary | ICD-10-CM | POA: Diagnosis not present

## 2015-09-05 DIAGNOSIS — I129 Hypertensive chronic kidney disease with stage 1 through stage 4 chronic kidney disease, or unspecified chronic kidney disease: Secondary | ICD-10-CM | POA: Diagnosis not present

## 2015-09-05 DIAGNOSIS — S065X0D Traumatic subdural hemorrhage without loss of consciousness, subsequent encounter: Secondary | ICD-10-CM | POA: Diagnosis not present

## 2015-09-05 DIAGNOSIS — Z9181 History of falling: Secondary | ICD-10-CM | POA: Diagnosis not present

## 2015-09-05 DIAGNOSIS — K219 Gastro-esophageal reflux disease without esophagitis: Secondary | ICD-10-CM | POA: Diagnosis not present

## 2015-09-05 DIAGNOSIS — Z7951 Long term (current) use of inhaled steroids: Secondary | ICD-10-CM | POA: Diagnosis not present

## 2015-09-05 DIAGNOSIS — J439 Emphysema, unspecified: Secondary | ICD-10-CM | POA: Diagnosis not present

## 2015-09-07 ENCOUNTER — Ambulatory Visit: Payer: Medicare Other | Admitting: Obstetrics and Gynecology

## 2015-09-09 DIAGNOSIS — I129 Hypertensive chronic kidney disease with stage 1 through stage 4 chronic kidney disease, or unspecified chronic kidney disease: Secondary | ICD-10-CM | POA: Diagnosis not present

## 2015-09-09 DIAGNOSIS — K219 Gastro-esophageal reflux disease without esophagitis: Secondary | ICD-10-CM | POA: Diagnosis not present

## 2015-09-09 DIAGNOSIS — S065X0D Traumatic subdural hemorrhage without loss of consciousness, subsequent encounter: Secondary | ICD-10-CM | POA: Diagnosis not present

## 2015-09-09 DIAGNOSIS — Z7951 Long term (current) use of inhaled steroids: Secondary | ICD-10-CM | POA: Diagnosis not present

## 2015-09-09 DIAGNOSIS — Z8673 Personal history of transient ischemic attack (TIA), and cerebral infarction without residual deficits: Secondary | ICD-10-CM | POA: Diagnosis not present

## 2015-09-09 DIAGNOSIS — Z9181 History of falling: Secondary | ICD-10-CM | POA: Diagnosis not present

## 2015-09-09 DIAGNOSIS — N189 Chronic kidney disease, unspecified: Secondary | ICD-10-CM | POA: Diagnosis not present

## 2015-09-09 DIAGNOSIS — J439 Emphysema, unspecified: Secondary | ICD-10-CM | POA: Diagnosis not present

## 2015-09-11 DIAGNOSIS — Z8673 Personal history of transient ischemic attack (TIA), and cerebral infarction without residual deficits: Secondary | ICD-10-CM | POA: Diagnosis not present

## 2015-09-11 DIAGNOSIS — I129 Hypertensive chronic kidney disease with stage 1 through stage 4 chronic kidney disease, or unspecified chronic kidney disease: Secondary | ICD-10-CM | POA: Diagnosis not present

## 2015-09-11 DIAGNOSIS — Z9181 History of falling: Secondary | ICD-10-CM | POA: Diagnosis not present

## 2015-09-11 DIAGNOSIS — K219 Gastro-esophageal reflux disease without esophagitis: Secondary | ICD-10-CM | POA: Diagnosis not present

## 2015-09-11 DIAGNOSIS — J439 Emphysema, unspecified: Secondary | ICD-10-CM | POA: Diagnosis not present

## 2015-09-11 DIAGNOSIS — N189 Chronic kidney disease, unspecified: Secondary | ICD-10-CM | POA: Diagnosis not present

## 2015-09-11 DIAGNOSIS — Z7951 Long term (current) use of inhaled steroids: Secondary | ICD-10-CM | POA: Diagnosis not present

## 2015-09-11 DIAGNOSIS — S065X0D Traumatic subdural hemorrhage without loss of consciousness, subsequent encounter: Secondary | ICD-10-CM | POA: Diagnosis not present

## 2015-09-12 ENCOUNTER — Telehealth: Payer: Self-pay

## 2015-09-12 NOTE — Telephone Encounter (Signed)
Thanks, she was called and is cured. No follow up needed.

## 2015-09-12 NOTE — Telephone Encounter (Signed)
Patient is calling regarding Hep C.  She was recently hospitalized ( sub dural hematoma) and was told her Hep C was not cured. She states she was told her Hep C was cured.  She is scheduled to return on Thursday for surgery to replace skull(bone flap)  that is now in her abdomen.  She is having abdominal pains with stools that are changing colors from glow in the dark green to mustard yellow.  Patient states she took 12 weeks of Harvoni.    Please advise  Laverle Patter, RN.

## 2015-09-12 NOTE — Telephone Encounter (Signed)
Called patient back after speaking to Dr. Linus Salmons and advised her that from a Hep C standpoint she is in fact negative for the Hep C virus. She is scheduled for an upcoming "surgery" and is very fearful she will not survive the procedure. She was tearful and asking that she be given something to calm her nerves and help with her stomach issues. She stated she had attempted suicide in the past when she received news that her son would be serving life in prison for murder. With the help of Orland Mustard, RN the call was given to [place], [place] counselor who assessed the patient and felt she was not currently suicidal and scheduled her an appt at [facility] for counseling 09/14/15 at 1:15 pm. Myrtis Hopping

## 2015-09-14 ENCOUNTER — Inpatient Hospital Stay (HOSPITAL_COMMUNITY)
Admission: RE | Admit: 2015-09-14 | Discharge: 2015-09-14 | Disposition: A | Discharge status: Home / Self Care | Payer: Medicare Other | Source: Ambulatory Visit

## 2015-09-14 ENCOUNTER — Other Ambulatory Visit: Payer: Self-pay | Admitting: Neurosurgery

## 2015-09-14 ENCOUNTER — Inpatient Hospital Stay (HOSPITAL_COMMUNITY): Payer: Medicare Other

## 2015-09-14 ENCOUNTER — Encounter (HOSPITAL_COMMUNITY): Payer: Self-pay | Admitting: Neurosurgery

## 2015-09-14 ENCOUNTER — Inpatient Hospital Stay (HOSPITAL_COMMUNITY)
Admission: RE | Admit: 2015-09-14 | Discharge: 2015-09-23 | DRG: 983 | Disposition: A | Discharge status: Home Health | Payer: Medicare Other | Source: Ambulatory Visit | Attending: Neurosurgery | Admitting: Neurosurgery

## 2015-09-14 DIAGNOSIS — Z885 Allergy status to narcotic agent status: Secondary | ICD-10-CM | POA: Diagnosis not present

## 2015-09-14 DIAGNOSIS — R0602 Shortness of breath: Secondary | ICD-10-CM | POA: Diagnosis not present

## 2015-09-14 DIAGNOSIS — E861 Hypovolemia: Secondary | ICD-10-CM | POA: Diagnosis not present

## 2015-09-14 DIAGNOSIS — Z91018 Allergy to other foods: Secondary | ICD-10-CM

## 2015-09-14 DIAGNOSIS — Z8673 Personal history of transient ischemic attack (TIA), and cerebral infarction without residual deficits: Secondary | ICD-10-CM

## 2015-09-14 DIAGNOSIS — Z9109 Other allergy status, other than to drugs and biological substances: Secondary | ICD-10-CM | POA: Diagnosis not present

## 2015-09-14 DIAGNOSIS — S065XAA Traumatic subdural hemorrhage with loss of consciousness status unknown, initial encounter: Secondary | ICD-10-CM | POA: Diagnosis present

## 2015-09-14 DIAGNOSIS — Z428 Encounter for other plastic and reconstructive surgery following medical procedure or healed injury: Principal | ICD-10-CM

## 2015-09-14 DIAGNOSIS — I951 Orthostatic hypotension: Secondary | ICD-10-CM | POA: Diagnosis not present

## 2015-09-14 DIAGNOSIS — S065X0A Traumatic subdural hemorrhage without loss of consciousness, initial encounter: Secondary | ICD-10-CM | POA: Diagnosis not present

## 2015-09-14 DIAGNOSIS — I959 Hypotension, unspecified: Secondary | ICD-10-CM | POA: Diagnosis not present

## 2015-09-14 DIAGNOSIS — Z933 Colostomy status: Secondary | ICD-10-CM | POA: Diagnosis not present

## 2015-09-14 DIAGNOSIS — I252 Old myocardial infarction: Secondary | ICD-10-CM | POA: Diagnosis not present

## 2015-09-14 DIAGNOSIS — I2510 Atherosclerotic heart disease of native coronary artery without angina pectoris: Secondary | ICD-10-CM | POA: Diagnosis not present

## 2015-09-14 DIAGNOSIS — E1122 Type 2 diabetes mellitus with diabetic chronic kidney disease: Secondary | ICD-10-CM | POA: Diagnosis present

## 2015-09-14 DIAGNOSIS — Z79899 Other long term (current) drug therapy: Secondary | ICD-10-CM

## 2015-09-14 DIAGNOSIS — Z881 Allergy status to other antibiotic agents status: Secondary | ICD-10-CM

## 2015-09-14 DIAGNOSIS — N189 Chronic kidney disease, unspecified: Secondary | ICD-10-CM | POA: Diagnosis present

## 2015-09-14 DIAGNOSIS — I129 Hypertensive chronic kidney disease with stage 1 through stage 4 chronic kidney disease, or unspecified chronic kidney disease: Secondary | ICD-10-CM | POA: Diagnosis not present

## 2015-09-14 DIAGNOSIS — K219 Gastro-esophageal reflux disease without esophagitis: Secondary | ICD-10-CM | POA: Diagnosis not present

## 2015-09-14 DIAGNOSIS — R071 Chest pain on breathing: Secondary | ICD-10-CM | POA: Insufficient documentation

## 2015-09-14 DIAGNOSIS — R062 Wheezing: Secondary | ICD-10-CM | POA: Insufficient documentation

## 2015-09-14 DIAGNOSIS — J449 Chronic obstructive pulmonary disease, unspecified: Secondary | ICD-10-CM | POA: Diagnosis not present

## 2015-09-14 DIAGNOSIS — S065X9A Traumatic subdural hemorrhage with loss of consciousness of unspecified duration, initial encounter: Secondary | ICD-10-CM

## 2015-09-14 DIAGNOSIS — R0789 Other chest pain: Secondary | ICD-10-CM | POA: Diagnosis not present

## 2015-09-14 DIAGNOSIS — F329 Major depressive disorder, single episode, unspecified: Secondary | ICD-10-CM | POA: Diagnosis present

## 2015-09-14 DIAGNOSIS — R51 Headache: Secondary | ICD-10-CM | POA: Diagnosis not present

## 2015-09-14 DIAGNOSIS — R0600 Dyspnea, unspecified: Secondary | ICD-10-CM | POA: Diagnosis not present

## 2015-09-14 DIAGNOSIS — Q758 Other specified congenital malformations of skull and face bones: Secondary | ICD-10-CM | POA: Diagnosis not present

## 2015-09-14 DIAGNOSIS — K7460 Unspecified cirrhosis of liver: Secondary | ICD-10-CM | POA: Diagnosis present

## 2015-09-14 DIAGNOSIS — R52 Pain, unspecified: Secondary | ICD-10-CM

## 2015-09-14 DIAGNOSIS — K1121 Acute sialoadenitis: Secondary | ICD-10-CM | POA: Diagnosis not present

## 2015-09-14 DIAGNOSIS — F17210 Nicotine dependence, cigarettes, uncomplicated: Secondary | ICD-10-CM | POA: Diagnosis present

## 2015-09-14 DIAGNOSIS — Z886 Allergy status to analgesic agent status: Secondary | ICD-10-CM | POA: Diagnosis not present

## 2015-09-14 DIAGNOSIS — Z85841 Personal history of malignant neoplasm of brain: Secondary | ICD-10-CM | POA: Diagnosis not present

## 2015-09-14 DIAGNOSIS — Z8249 Family history of ischemic heart disease and other diseases of the circulatory system: Secondary | ICD-10-CM

## 2015-09-14 DIAGNOSIS — Z01818 Encounter for other preprocedural examination: Secondary | ICD-10-CM | POA: Diagnosis not present

## 2015-09-14 DIAGNOSIS — Z880 Allergy status to penicillin: Secondary | ICD-10-CM

## 2015-09-14 DIAGNOSIS — M952 Other acquired deformity of head: Secondary | ICD-10-CM | POA: Diagnosis not present

## 2015-09-14 DIAGNOSIS — B1920 Unspecified viral hepatitis C without hepatic coma: Secondary | ICD-10-CM | POA: Diagnosis present

## 2015-09-14 HISTORY — DX: Type 2 diabetes mellitus without complications: E11.9

## 2015-09-14 MED ORDER — IPRATROPIUM-ALBUTEROL 0.5-2.5 (3) MG/3ML IN SOLN: 3.0000 mL | Freq: Four times a day (QID) | RESPIRATORY_TRACT | Status: DC | PRN

## 2015-09-14 MED ORDER — PANTOPRAZOLE SODIUM 40 MG PO TBEC
40.0000 mg | DELAYED_RELEASE_TABLET | Freq: Every day | ORAL | Status: DC
  Administered 2015-09-16 – 2015-09-23 (×8): 40 mg via ORAL
  Filled 2015-09-14 (×8): qty 1

## 2015-09-14 MED ORDER — DIPHENHYDRAMINE HCL 25 MG PO CAPS
25.0000 mg | ORAL_CAPSULE | Freq: Four times a day (QID) | ORAL | Status: DC | PRN
  Administered 2015-09-14 – 2015-09-22 (×10): 25 mg via ORAL
  Filled 2015-09-14 (×10): qty 1

## 2015-09-14 MED ORDER — OXYCODONE HCL 20 MG PO TABS: 1.0000 | ORAL_TABLET | Freq: Three times a day (TID) | ORAL | Status: DC | PRN

## 2015-09-14 MED ORDER — LEVETIRACETAM 500 MG PO TABS
500.0000 mg | ORAL_TABLET | Freq: Two times a day (BID) | ORAL | Status: DC
  Administered 2015-09-14 – 2015-09-23 (×18): 500 mg via ORAL
  Filled 2015-09-14 (×18): qty 1

## 2015-09-14 MED ORDER — ZOLPIDEM TARTRATE 5 MG PO TABS
5.0000 mg | ORAL_TABLET | Freq: Every day | ORAL | Status: DC
  Administered 2015-09-14 – 2015-09-22 (×9): 5 mg via ORAL
  Filled 2015-09-14 (×9): qty 1

## 2015-09-14 MED ORDER — OXYCODONE HCL 5 MG PO TABS
5.0000 mg | ORAL_TABLET | Freq: Four times a day (QID) | ORAL | Status: DC | PRN
  Administered 2015-09-14 – 2015-09-16 (×5): 5 mg via ORAL
  Filled 2015-09-14 (×5): qty 1

## 2015-09-14 MED ORDER — TRIAMCINOLONE ACETONIDE 55 MCG/ACT NA AERO: 2.0000 | INHALATION_SPRAY | Freq: Every day | NASAL | Status: DC | PRN

## 2015-09-14 MED ORDER — TIOTROPIUM BROMIDE MONOHYDRATE 18 MCG IN CAPS: 18.0000 ug | ORAL_CAPSULE | Freq: Two times a day (BID) | RESPIRATORY_TRACT | Status: DC | PRN

## 2015-09-14 MED ORDER — PRAVASTATIN SODIUM 20 MG PO TABS
10.0000 mg | ORAL_TABLET | Freq: Every day | ORAL | Status: DC
  Administered 2015-09-14 – 2015-09-23 (×9): 10 mg via ORAL
  Filled 2015-09-14 (×9): qty 1

## 2015-09-14 MED ORDER — GABAPENTIN 300 MG PO CAPS
300.0000 mg | ORAL_CAPSULE | Freq: Three times a day (TID) | ORAL | Status: DC
  Administered 2015-09-14 – 2015-09-23 (×24): 300 mg via ORAL
  Filled 2015-09-14 (×24): qty 1

## 2015-09-14 MED ORDER — AMITRIPTYLINE HCL 25 MG PO TABS
50.0000 mg | ORAL_TABLET | Freq: Every day | ORAL | Status: DC
  Administered 2015-09-14 – 2015-09-22 (×9): 50 mg via ORAL
  Filled 2015-09-14: qty 1
  Filled 2015-09-14: qty 2
  Filled 2015-09-14 (×2): qty 1
  Filled 2015-09-14 (×2): qty 2
  Filled 2015-09-14: qty 1
  Filled 2015-09-14 (×3): qty 2
  Filled 2015-09-14: qty 1

## 2015-09-14 MED ORDER — LACTULOSE 10 GM/15ML PO SOLN: 20.0000 g | Freq: Every day | ORAL | Status: DC | PRN

## 2015-09-14 MED ORDER — ALBUTEROL SULFATE (2.5 MG/3ML) 0.083% IN NEBU
2.5000 mg | INHALATION_SOLUTION | RESPIRATORY_TRACT | Status: DC | PRN
Start: 2015-09-14 — End: 2015-09-17
  Filled 2015-09-14: qty 3

## 2015-09-14 MED ORDER — FUROSEMIDE 20 MG PO TABS
20.0000 mg | ORAL_TABLET | Freq: Every day | ORAL | Status: DC
  Administered 2015-09-14 – 2015-09-16 (×2): 20 mg via ORAL
  Filled 2015-09-14 (×2): qty 1

## 2015-09-14 MED ORDER — VANCOMYCIN HCL 10 G IV SOLR
1500.0000 mg | INTRAVENOUS | Status: AC
  Administered 2015-09-15: 1500 mg via INTRAVENOUS
  Filled 2015-09-14: qty 1500

## 2015-09-14 MED ORDER — OXYCODONE HCL ER 20 MG PO T12A
20.0000 mg | EXTENDED_RELEASE_TABLET | Freq: Two times a day (BID) | ORAL | Status: DC
  Administered 2015-09-14 – 2015-09-23 (×17): 20 mg via ORAL
  Filled 2015-09-14 (×18): qty 1

## 2015-09-14 MED ORDER — SERTRALINE HCL 50 MG PO TABS
50.0000 mg | ORAL_TABLET | Freq: Every day | ORAL | Status: DC
  Administered 2015-09-16 – 2015-09-23 (×8): 50 mg via ORAL
  Filled 2015-09-14 (×8): qty 1

## 2015-09-14 MED ORDER — MOMETASONE FURO-FORMOTEROL FUM 200-5 MCG/ACT IN AERO
2.0000 | INHALATION_SPRAY | Freq: Two times a day (BID) | RESPIRATORY_TRACT | Status: DC
  Administered 2015-09-14 – 2015-09-16 (×4): 2 via RESPIRATORY_TRACT
  Filled 2015-09-14 (×2): qty 8.8

## 2015-09-14 MED ORDER — HYDROCORTISONE 0.5 % EX CREA
1.0000 "application " | TOPICAL_CREAM | Freq: Two times a day (BID) | CUTANEOUS | Status: DC | PRN
  Filled 2015-09-14: qty 28.35

## 2015-09-14 MED ORDER — METOPROLOL SUCCINATE ER 25 MG PO TB24
25.0000 mg | ORAL_TABLET | Freq: Every day | ORAL | Status: DC
  Administered 2015-09-14 – 2015-09-23 (×6): 25 mg via ORAL
  Filled 2015-09-14 (×9): qty 1

## 2015-09-14 MED ORDER — PANCRELIPASE (LIP-PROT-AMYL) 12000-38000 UNITS PO CPEP
24000.0000 [IU] | ORAL_CAPSULE | Freq: Three times a day (TID) | ORAL | Status: DC
  Administered 2015-09-16 – 2015-09-23 (×21): 24000 [IU] via ORAL
  Filled 2015-09-14 (×22): qty 2

## 2015-09-14 MED ORDER — BISMUTH SUBSALICYLATE 525 MG/15ML PO SUSP: 15.0000 mL | ORAL | Status: DC | PRN

## 2015-09-14 MED ORDER — POTASSIUM CHLORIDE IN NACL 20-0.9 MEQ/L-% IV SOLN
INTRAVENOUS | Status: DC
  Administered 2015-09-15 (×2): via INTRAVENOUS
  Filled 2015-09-14 (×5): qty 1000

## 2015-09-14 NOTE — Pre-Procedure Instructions (Signed)
    [name]  09/14/2015      [facility], [place] [address] Phone: [phone] Fax: [phone]  [place], [place] [address] [place] [address] Phone: [phone] Fax: [place], [place] - [place] [address] Phone: [phone] Fax: [phone]  [facility] [place] [address] [place] [address] [place] [address] Phone: [phone] Fax: [phone]    Your procedure is scheduled on 09/15/15.  Report to [facility] Admitting at 1030 A.M.  Call this number if you have problems the morning of surgery:  [phone]   Remember:  Do not eat food or drink liquids after midnight.  Take these medicines the morning of surgery with A SIP OF WATER --all inhalers,neurontin,keppra,metoprolol,prilosec,oxycodone   Do not wear jewelry, make-up or nail polish.  Do not wear lotions, powders, or perfumes.  You may wear deodorant.  Do not shave 48 hours prior to surgery.  Men may shave face and neck.  Do not bring valuables to the hospital.  [facility] is not responsible for any belongings or valuables.  Contacts, dentures or bridgework may not be worn into surgery.  Leave your suitcase in the car.  After surgery it may be brought to your room.  For patients admitted to the hospital, discharge time will be determined by your treatment team.  Patients discharged the day of surgery will not be allowed to drive home. Name and phone number of your driver:   Special instructions:   Please read over the following fact sheets that you were given. Pain Booklet, Coughing and Deep Breathing, Blood Transfusion Information and MRSA Information

## 2015-09-14 NOTE — Progress Notes (Signed)
Pt arrived to 5c06 @ current time, Pt A&Ox 4, Pt without distress. Family at the bedside. Diet ordered, will monitor.

## 2015-09-14 NOTE — H&P (Addendum)
BP 124/72 mmHg  Pulse 93  Temp(Src) 99 F (37.2 C) (Oral)  Resp 17  SpO2 99% CC: Cranial defect, status post craniectomy for subdural hematoma evacuation HPI: [name] is a 60 y.o. female Whom was admitted to the hospital on 08/16/15 for weakness on the left side, and CT evidence of a large right sided panhemispheric fluid collection. She was taken to the operating room for an uncomplicated craniectomy to drain a subdural hematoma. I believed leaving the bone flap out would allow for post op edema in the brain. She did well and is admitted for a cranioplasty. The bone flap was place in the abdominal wall on the right side.  Allergies  Allergen Reactions  . Ibuprofen Other (See Comments)    Cannot take because of liver disease   . Sulfa Antibiotics Hives  . Amoxicillin Rash  . Chocolate Rash  . Ciprofloxacin Itching  . Dilaudid [Hydromorphone Hcl] Itching  . Fentanyl Rash  . Hydromorphone Itching  . Metformin Nausea Only  . Penicillins Rash and Other (See Comments)    Has patient had a PCN reaction causing immediate rash, facial/tongue/throat swelling, SOB or lightheadedness with hypotension: [id number] Has patient had a PCN reaction causing severe rash involving mucus membranes or skin necrosis: [id number] Has patient had a PCN reaction that required hospitalization [id number] Has patient had a PCN reaction occurring within the last 10 years: [id number] If all of the above answers are "NO", then may proceed with Cephalosporin use.   . Strawberry (Diagnostic) Rash  . Strawberry Extract Rash  . Zofran [Ondansetron Hcl] Itching and Nausea And Vomiting    Do not use; vomiting per pt  . Zofran [Ondansetron] Other (See Comments)    Do not use; vomiting per pt Unlikely due to Zofran   Past Medical History  Diagnosis Date  . Emphysema of lung ([place])   . Depression   . Diverticulitis   . Chronic bronchitis ([place])   . Malignant brain tumor ([place]) 2007  . GERD  (gastroesophageal reflux disease)   . Allergy   . Hepatitis C   . Hypertension   . Chronic kidney disease   . Migraines   . Urine incontinence   . Seizures ([place])   . Stroke [facility]) 2007    during brain surgery  . Pancreatitis, alcoholic 0000000  . Rectovaginal fistula   . H/O ETOH abuse     Sober since 2009  . H/O drug abuse     Clean since 2009  . Pancreatic ascites   . Cirrhosis [facility])    Past Surgical History  Procedure Laterality Date  . Appendectomy  1999  . Eye surgery    . Rectovaginal fistula repair w/ colostomy  2011  . Brain surgery  2007    malignant brain tumor  . Cholecystectomy  2001  . Abdominal hysterectomy  1979  . Craniotomy Right 08/16/2015    Procedure: Right Fronto-Temporal-Parietal Craniotomy for Evacuation of Hematoma with Bone Flap Placement in Abdomen;  Surgeon: Ashok Pall, MD;  Location: [facility];  Service: Neurosurgery;  Laterality: Right;   Family History  Problem Relation Age of Onset  . Hypertension Mother   . Heart disease Father   . Diabetes Father   . Cancer Sister     brain  . Cancer Grandchild 8    brain tumor   Social History   Social History  . Marital Status: Divorced    Spouse Name: N/A  . Number of Children: N/A  . Years  of Education: N/A   Occupational History  . Not on file.   Social History Main Topics  . Smoking status: Current Some Day Smoker -- 0.25 packs/day    Types: Cigarettes  . Smokeless tobacco: Never Used     Comment: cutting back  . Alcohol Use: No     Comment: no alcohol since 2013  . Drug Use: No  . Sexual Activity: Not Currently    Birth Control/ Protection: Other-see comments     Comment: hysterectomy   Other Topics Concern  . Not on file   Social History Narrative   Lives in [place] with husband, has 4 sons (2 in prison).            Prior to Admission medications   Medication Sig Start Date End Date Taking? Authorizing Provider  ADVAIR DISKUS 500-50 MCG/DOSE AEPB inhale 1 dose by  mouth twice a day 12/07/14  Yes Historical Provider, MD  albuterol (PROAIR HFA) 108 (90 BASE) MCG/ACT inhaler Inhale 2 puffs into the lungs every 4 (four) hours as needed for wheezing.  11/30/14  Yes Historical Provider, MD  amitriptyline (ELAVIL) 50 MG tablet Take 1 tablet (50 mg total) by mouth at bedtime. 12/01/12  Yes Jackolyn Confer, MD  Bismuth Subsalicylate (KAOPECTATE EXTRA STRENGTH) 525 MG/15ML SUSP Take 15 mLs (525 mg total) by mouth every 2 (two) hours as needed. 06/16/15  Yes Rubie Maid, MD  CREON 12000 UNITS CPEP Take 24,000 Units by mouth 3 (three) times daily before meals.  08/11/12  Yes Historical Provider, MD  furosemide (LASIX) 20 MG tablet Take 20 mg by mouth daily.   Yes Historical Provider, MD  gabapentin (NEURONTIN) 300 MG capsule Take 300 mg by mouth 3 (three) times daily.    Yes Historical Provider, MD  hydrocortisone cream 0.5 % Apply 1 application topically 2 (two) times daily as needed for itching. 06/16/15  Yes Rubie Maid, MD  Ipratropium-Albuterol (COMBIVENT) 20-100 MCG/ACT AERS respimat Inhale 1 puff into the lungs every 6 (six) hours as needed for wheezing or shortness of breath.    Yes Historical Provider, MD  lactulose (CHRONULAC) 10 GM/15ML solution Take 20 g by mouth daily as needed for mild constipation.  09/07/12  Yes Historical Provider, MD  levETIRAcetam (KEPPRA) 500 MG tablet Take 1 tablet (500 mg total) by mouth 2 (two) times daily. 11/03/12  Yes Jackolyn Confer, MD  metoprolol succinate (TOPROL-XL) 25 MG 24 hr tablet Take 1 tablet (25 mg total) by mouth daily. 10/21/12  Yes Raquel Dagoberto Ligas, NP  omeprazole (PRILOSEC) 20 MG capsule Take 1 capsule (20 mg total) by mouth daily. 07/26/14  Yes [name], MD  oxyCODONE (OXY IR/ROXICODONE) 5 MG immediate release tablet Take 1 tablet (5 mg total) by mouth every 6 (six) hours as needed for moderate pain. 08/29/15  Yes Ashok Pall, MD  oxyCODONE (OXYCONTIN) 20 mg 12 hr tablet Take 1 tablet (20 mg total) by mouth every 12  (twelve) hours. 08/29/15  Yes Ashok Pall, MD  Oxycodone HCl 20 MG TABS Take 1 tablet by mouth every 8 (eight) hours as needed. 04/09/15  Yes Historical Provider, MD  pravastatin (PRAVACHOL) 10 MG tablet Take 10 mg by mouth daily.  12/07/14  Yes Historical Provider, MD  sertraline (ZOLOFT) 50 MG tablet Take 50 mg by mouth daily.    Yes Historical Provider, MD  tiotropium (SPIRIVA) 18 MCG inhalation capsule Place 18 mcg into inhaler and inhale 2 (two) times daily as needed (shortness of breath).  Yes Historical Provider, MD  triamcinolone (NASACORT ALLERGY 24HR) 55 MCG/ACT AERO nasal inhaler Place 2 sprays into the nose daily as needed (allergies).   Yes Historical Provider, MD  zolpidem (AMBIEN) 10 MG tablet Take 1 tablet (10 mg total) by mouth at bedtime. 10/21/12  Yes Raquel Dagoberto Ligas, NP  Physical Exam  Constitutional: She is oriented to person, place, and time. She appears well-developed and well-nourished. No distress.  HENT:  Right cranial defect. Wound is clean, dry, no signs of infection.   Eyes: Conjunctivae and EOM are normal. Pupils are equal, round, and reactive to light.  Neck: Normal range of motion. Neck supple.  Abdominal: Soft. Bowel sounds are normal.  Cranial flap easily palpated, wound is clean and dry.   Musculoskeletal: Normal range of motion.  Neurological: She is alert and oriented to person, place, and time. No cranial nerve deficit. She exhibits normal muscle tone. Coordination normal.  Skin: Skin is warm and dry.  Psychiatric: She has a normal mood and affect. Her behavior is normal. Judgment and thought content normal.  Assesment/Plan Admit for cranioplasty. Risks and benefits discussed with patient. She understands that bleeding, infection, brain damage, weakness, paralysis, change in mentation, stroke,coma, death, and other risks were discussed. She would like to proceed.

## 2015-09-15 ENCOUNTER — Inpatient Hospital Stay (HOSPITAL_COMMUNITY): Payer: Medicare Other | Admitting: Emergency Medicine

## 2015-09-15 ENCOUNTER — Encounter (HOSPITAL_COMMUNITY)
Admission: RE | Disposition: A | Discharge status: Home Health | Payer: Self-pay | Source: Ambulatory Visit | Attending: Neurosurgery

## 2015-09-15 ENCOUNTER — Inpatient Hospital Stay (HOSPITAL_COMMUNITY): Payer: Medicare Other | Admitting: Certified Registered"

## 2015-09-15 HISTORY — PX: CRANIOPLASTY: SHX1407

## 2015-09-15 LAB — GLUCOSE, CAPILLARY: Glucose-Capillary: 73 mg/dL (ref 65–99)

## 2015-09-15 LAB — MRSA PCR SCREENING: MRSA BY PCR: POSITIVE — AB

## 2015-09-15 SURGERY — CRANIOPLASTY
Anesthesia: General

## 2015-09-15 MED ORDER — NALOXONE HCL 0.4 MG/ML IJ SOLN: 0.0800 mg | INTRAMUSCULAR | Status: DC | PRN

## 2015-09-15 MED ORDER — MIDAZOLAM HCL 2 MG/2ML IJ SOLN
INTRAMUSCULAR | Status: AC
  Filled 2015-09-15: qty 2

## 2015-09-15 MED ORDER — CLINDAMYCIN PHOSPHATE 300 MG/50ML IV SOLN
300.0000 mg | Freq: Four times a day (QID) | INTRAVENOUS | Status: AC
  Administered 2015-09-15 – 2015-09-16 (×3): 300 mg via INTRAVENOUS
  Filled 2015-09-15 (×3): qty 50

## 2015-09-15 MED ORDER — FENTANYL CITRATE (PF) 100 MCG/2ML IJ SOLN
25.0000 ug | INTRAMUSCULAR | Status: DC | PRN
  Administered 2015-09-15: 50 ug via INTRAVENOUS

## 2015-09-15 MED ORDER — MIDAZOLAM HCL 5 MG/5ML IJ SOLN
INTRAMUSCULAR | Status: DC | PRN
  Administered 2015-09-15: 2 mg via INTRAVENOUS

## 2015-09-15 MED ORDER — THROMBIN 20000 UNITS EX SOLR
CUTANEOUS | Status: DC | PRN
  Administered 2015-09-15: 15:00:00 via TOPICAL

## 2015-09-15 MED ORDER — CHLORHEXIDINE GLUCONATE CLOTH 2 % EX PADS
6.0000 | MEDICATED_PAD | Freq: Every day | CUTANEOUS | Status: AC
  Administered 2015-09-16 – 2015-09-20 (×5): 6 via TOPICAL

## 2015-09-15 MED ORDER — PROMETHAZINE HCL 25 MG PO TABS: 12.5000 mg | ORAL_TABLET | ORAL | Status: DC | PRN

## 2015-09-15 MED ORDER — PROPOFOL 10 MG/ML IV BOLUS
INTRAVENOUS | Status: DC | PRN
  Administered 2015-09-15: 150 mg via INTRAVENOUS

## 2015-09-15 MED ORDER — MORPHINE SULFATE (PF) 2 MG/ML IV SOLN
2.0000 mg | INTRAVENOUS | Status: DC | PRN
  Administered 2015-09-15 (×2): 2 mg via INTRAVENOUS
  Filled 2015-09-15 (×2): qty 1

## 2015-09-15 MED ORDER — PROPOFOL 10 MG/ML IV BOLUS
INTRAVENOUS | Status: AC
  Filled 2015-09-15: qty 20

## 2015-09-15 MED ORDER — BACITRACIN ZINC 500 UNIT/GM EX OINT
TOPICAL_OINTMENT | CUTANEOUS | Status: DC | PRN
  Administered 2015-09-15: 1 via TOPICAL

## 2015-09-15 MED ORDER — 0.9 % SODIUM CHLORIDE (POUR BTL) OPTIME
TOPICAL | Status: DC | PRN
  Administered 2015-09-15 (×3): 1000 mL

## 2015-09-15 MED ORDER — LIDOCAINE HCL (CARDIAC) 20 MG/ML IV SOLN
INTRAVENOUS | Status: DC | PRN
  Administered 2015-09-15: 80 mg via INTRAVENOUS

## 2015-09-15 MED ORDER — PROMETHAZINE HCL 25 MG/ML IJ SOLN: 6.2500 mg | INTRAMUSCULAR | Status: DC | PRN

## 2015-09-15 MED ORDER — SUGAMMADEX SODIUM 500 MG/5ML IV SOLN
INTRAVENOUS | Status: DC | PRN
  Administered 2015-09-15: 250 mg via INTRAVENOUS

## 2015-09-15 MED ORDER — FENTANYL CITRATE (PF) 100 MCG/2ML IJ SOLN
INTRAMUSCULAR | Status: AC
  Filled 2015-09-15: qty 2

## 2015-09-15 MED ORDER — LACTATED RINGERS IV SOLN: INTRAVENOUS | Status: DC

## 2015-09-15 MED ORDER — HYDROCODONE-ACETAMINOPHEN 5-325 MG PO TABS
1.0000 | ORAL_TABLET | ORAL | Status: DC | PRN
  Administered 2015-09-18 – 2015-09-22 (×7): 1 via ORAL
  Filled 2015-09-15 (×7): qty 1

## 2015-09-15 MED ORDER — LACTATED RINGERS IV SOLN
INTRAVENOUS | Status: DC | PRN
  Administered 2015-09-15 (×2): via INTRAVENOUS

## 2015-09-15 MED ORDER — SUGAMMADEX SODIUM 500 MG/5ML IV SOLN
INTRAVENOUS | Status: AC
  Filled 2015-09-15: qty 5

## 2015-09-15 MED ORDER — POTASSIUM CHLORIDE IN NACL 20-0.9 MEQ/L-% IV SOLN: INTRAVENOUS | Status: DC

## 2015-09-15 MED ORDER — LABETALOL HCL 5 MG/ML IV SOLN: 10.0000 mg | INTRAVENOUS | Status: DC | PRN

## 2015-09-15 MED ORDER — ROCURONIUM BROMIDE 100 MG/10ML IV SOLN
INTRAVENOUS | Status: DC | PRN
  Administered 2015-09-15: 50 mg via INTRAVENOUS

## 2015-09-15 MED ORDER — MUPIROCIN 2 % EX OINT
1.0000 "application " | TOPICAL_OINTMENT | Freq: Two times a day (BID) | CUTANEOUS | Status: AC
  Administered 2015-09-16 – 2015-09-20 (×10): 1 via NASAL
  Filled 2015-09-15 (×2): qty 22

## 2015-09-15 MED ORDER — OXYCODONE HCL 5 MG/5ML PO SOLN: 5.0000 mg | Freq: Once | ORAL | Status: DC | PRN

## 2015-09-15 MED ORDER — OXYCODONE HCL 5 MG PO TABS: 10.0000 mg | ORAL_TABLET | Freq: Once | ORAL | Status: DC | PRN

## 2015-09-15 MED ORDER — PANTOPRAZOLE SODIUM 40 MG IV SOLR: 40.0000 mg | Freq: Every day | INTRAVENOUS | Status: DC

## 2015-09-15 MED ORDER — FENTANYL CITRATE (PF) 100 MCG/2ML IJ SOLN
INTRAMUSCULAR | Status: DC | PRN
  Administered 2015-09-15: 100 ug via INTRAVENOUS
  Administered 2015-09-15: 150 ug via INTRAVENOUS

## 2015-09-15 MED ORDER — ONDANSETRON HCL 4 MG/2ML IJ SOLN
INTRAMUSCULAR | Status: DC | PRN
  Administered 2015-09-15: 4 mg via INTRAVENOUS

## 2015-09-15 MED ORDER — FENTANYL CITRATE (PF) 250 MCG/5ML IJ SOLN
INTRAMUSCULAR | Status: AC
Start: 2015-09-15 — End: 2015-09-15
  Filled 2015-09-15: qty 5

## 2015-09-15 MED ORDER — FENTANYL CITRATE (PF) 100 MCG/2ML IJ SOLN: 25.0000 ug | INTRAMUSCULAR | Status: DC | PRN

## 2015-09-15 MED ORDER — MORPHINE SULFATE (PF) 2 MG/ML IV SOLN
1.0000 mg | INTRAVENOUS | Status: DC | PRN
  Administered 2015-09-15 – 2015-09-16 (×2): 2 mg via INTRAVENOUS
  Filled 2015-09-15 (×4): qty 1

## 2015-09-15 SURGICAL SUPPLY — 81 items
BANDAGE GAUZE 4  KLING STR (GAUZE/BANDAGES/DRESSINGS) ×3 IMPLANT
BLADE CLIPPER SURG (BLADE) IMPLANT
BNDG GAUZE ELAST 4 BULKY (GAUZE/BANDAGES/DRESSINGS) ×9 IMPLANT
BOWL SPATULA (MISCELLANEOUS) IMPLANT
BRUSH SCRUB EZ 1% IODOPHOR (MISCELLANEOUS) IMPLANT
CANISTER SUCT 3000ML PPV (MISCELLANEOUS) ×3 IMPLANT
CLIP RANEY DISP (INSTRUMENTS) IMPLANT
CORDS BIPOLAR (ELECTRODE) IMPLANT
COVER BACK TABLE 60X90IN (DRAPES) IMPLANT
DECANTER SPIKE VIAL GLASS SM (MISCELLANEOUS) ×3 IMPLANT
DEPRESSOR TONGUE BLADE STERILE (MISCELLANEOUS) IMPLANT
DRAIN JACKSON PRATT 10MM FLAT (MISCELLANEOUS) ×3 IMPLANT
DRAPE INCISE IOBAN 66X45 STRL (DRAPES) ×3 IMPLANT
DRAPE NEUROLOGICAL W/INCISE (DRAPES) ×3 IMPLANT
DRAPE SURG IRRIG POUCH 19X23 (DRAPES) ×3 IMPLANT
DRAPE WARM FLUID 44X44 (DRAPE) ×3 IMPLANT
DURAMATRIX ONLAY 3X3 (Plate) ×3 IMPLANT
DURAPREP 6ML APPLICATOR 50/CS (WOUND CARE) ×3 IMPLANT
ELECT CAUTERY BLADE 6.4 (BLADE) ×3 IMPLANT
ELECT REM PT RETURN 9FT ADLT (ELECTROSURGICAL) ×3
ELECTRODE REM PT RTRN 9FT ADLT (ELECTROSURGICAL) ×1 IMPLANT
EVACUATOR SILICONE 100CC (DRAIN) ×3 IMPLANT
GAUZE SPONGE 4X4 12PLY STRL (GAUZE/BANDAGES/DRESSINGS) ×6 IMPLANT
GAUZE SPONGE 4X4 16PLY XRAY LF (GAUZE/BANDAGES/DRESSINGS) IMPLANT
GLOVE BIO SURGEON STRL SZ 6.5 (GLOVE) IMPLANT
GLOVE BIO SURGEON STRL SZ7 (GLOVE) IMPLANT
GLOVE BIO SURGEON STRL SZ7.5 (GLOVE) IMPLANT
GLOVE BIO SURGEON STRL SZ8 (GLOVE) IMPLANT
GLOVE BIO SURGEON STRL SZ8.5 (GLOVE) IMPLANT
GLOVE BIO SURGEONS STRL SZ 6.5 (GLOVE)
GLOVE BIOGEL M 8.0 STRL (GLOVE) IMPLANT
GLOVE ECLIPSE 6.5 STRL STRAW (GLOVE) ×3 IMPLANT
GLOVE ECLIPSE 7.0 STRL STRAW (GLOVE) IMPLANT
GLOVE ECLIPSE 7.5 STRL STRAW (GLOVE) IMPLANT
GLOVE ECLIPSE 8.0 STRL XLNG CF (GLOVE) IMPLANT
GLOVE ECLIPSE 8.5 STRL (GLOVE) IMPLANT
GLOVE EXAM NITRILE LRG STRL (GLOVE) IMPLANT
GLOVE EXAM NITRILE MD LF STRL (GLOVE) IMPLANT
GLOVE EXAM NITRILE XL STR (GLOVE) IMPLANT
GLOVE EXAM NITRILE XS STR PU (GLOVE) IMPLANT
GLOVE INDICATOR 6.5 STRL GRN (GLOVE) IMPLANT
GLOVE INDICATOR 7.0 STRL GRN (GLOVE) IMPLANT
GLOVE INDICATOR 7.5 STRL GRN (GLOVE) IMPLANT
GLOVE INDICATOR 8.0 STRL GRN (GLOVE) IMPLANT
GLOVE INDICATOR 8.5 STRL (GLOVE) IMPLANT
GLOVE OPTIFIT SS 8.0 STRL (GLOVE) IMPLANT
GLOVE SURG SS PI 6.5 STRL IVOR (GLOVE) IMPLANT
GOWN STRL REUS W/ TWL LRG LVL3 (GOWN DISPOSABLE) ×2 IMPLANT
GOWN STRL REUS W/ TWL XL LVL3 (GOWN DISPOSABLE) IMPLANT
GOWN STRL REUS W/TWL 2XL LVL3 (GOWN DISPOSABLE) IMPLANT
GOWN STRL REUS W/TWL LRG LVL3 (GOWN DISPOSABLE) ×4
GOWN STRL REUS W/TWL XL LVL3 (GOWN DISPOSABLE)
HEMOSTAT SURGICEL 2X14 (HEMOSTASIS) IMPLANT
KIT BASIN OR (CUSTOM PROCEDURE TRAY) ×3 IMPLANT
KIT ROOM TURNOVER OR (KITS) ×3 IMPLANT
LIQUID BAND (GAUZE/BANDAGES/DRESSINGS) IMPLANT
NEEDLE HYPO 25X1 1.5 SAFETY (NEEDLE) ×3 IMPLANT
NS IRRIG 1000ML POUR BTL (IV SOLUTION) ×9 IMPLANT
PACK CRANIOTOMY (CUSTOM PROCEDURE TRAY) ×3 IMPLANT
PAD ARMBOARD 7.5X6 YLW CONV (MISCELLANEOUS) ×9 IMPLANT
PIN MAYFIELD SKULL DISP (PIN) IMPLANT
PLATE 1.5  2HOLE LNG NEURO (Plate) ×12 IMPLANT
PLATE 1.5 2HOLE LNG NEURO (Plate) ×6 IMPLANT
SCREW SELF DRILL HT 1.5/4MM (Screw) ×36 IMPLANT
SET CRAINOPLASTY (SET/KITS/TRAYS/PACK) IMPLANT
SPONGE SURGIFOAM ABS GEL 100 (HEMOSTASIS) ×3 IMPLANT
STAPLER SKIN PROX WIDE 3.9 (STAPLE) ×3 IMPLANT
SUT ETHILON 3 0 FSL (SUTURE) ×6 IMPLANT
SUT NURALON 4 0 TR CR/8 (SUTURE) IMPLANT
SUT VIC AB 2-0 CT1 18 (SUTURE) ×6 IMPLANT
SUT VIC AB 2-0 CT2 18 VCP726D (SUTURE) ×9 IMPLANT
SUT VIC AB 3-0 SH 8-18 (SUTURE) ×6 IMPLANT
SYR CONTROL 10ML LL (SYRINGE) IMPLANT
TAPE CLOTH SURG 4X10 WHT LF (GAUZE/BANDAGES/DRESSINGS) ×3 IMPLANT
TOWEL OR 17X24 6PK STRL BLUE (TOWEL DISPOSABLE) ×6 IMPLANT
TOWEL OR 17X26 10 PK STRL BLUE (TOWEL DISPOSABLE) ×3 IMPLANT
TRAY FOLEY W/METER SILVER 14FR (SET/KITS/TRAYS/PACK) IMPLANT
TUBE CONNECTING 12'X1/4 (SUCTIONS) ×1
TUBE CONNECTING 12X1/4 (SUCTIONS) ×2 IMPLANT
UNDERPAD 30X30 INCONTINENT (UNDERPADS AND DIAPERS) IMPLANT
WATER STERILE IRR 1000ML POUR (IV SOLUTION) ×3 IMPLANT

## 2015-09-15 NOTE — Transfer of Care (Signed)
Immediate Anesthesia Transfer of Care Note  Patient: [name]  Procedure(s) Performed: Procedure(s) with comments: Cranioplasty with retrieval of bone flap from abdominal pocket (N/A) - Cranioplasty with retrieval of bone flap from abdominal pocket  Patient Location: PACU  Anesthesia Type:General  Level of Consciousness: awake and alert   Airway & Oxygen Therapy: Patient Spontanous Breathing and Patient connected to nasal cannula oxygen  Post-op Assessment: Report given to RN and Post -op Vital signs reviewed and stable  Post vital signs: Reviewed and stable  Last Vitals:  Filed Vitals:   09/15/15 0621 09/15/15 0954  BP: 115/90 105/59  Pulse: 83 80  Temp: 36.9 C 37 C  Resp: 19 18    Complications: No apparent anesthesia complications

## 2015-09-15 NOTE — Anesthesia Procedure Notes (Signed)
Procedure Name: Intubation Date/Time: 09/15/2015 2:09 PM Performed by: Lance Coon Pre-anesthesia Checklist: Patient identified, Timeout performed, Emergency Drugs available, Suction available and Patient being monitored Patient Re-evaluated:Patient Re-evaluated prior to inductionOxygen Delivery Method: Circle system utilized Preoxygenation: Pre-oxygenation with 100% oxygen Intubation Type: IV induction Ventilation: Oral airway inserted - appropriate to patient size and Mask ventilation without difficulty Laryngoscope Size: Miller and 2 Grade View: Grade I Tube type: Oral Tube size: 7.0 mm Number of attempts: 1 Airway Equipment and Method: Stylet Placement Confirmation: ETT inserted through vocal cords under direct vision,  breath sounds checked- equal and bilateral and CO2 detector Secured at: 21 cm Tube secured with: Tape Dental Injury: Teeth and Oropharynx as per pre-operative assessment

## 2015-09-15 NOTE — Anesthesia Postprocedure Evaluation (Signed)
Anesthesia Post Note  Patient: [name]  Procedure(s) Performed: Procedure(s) (LRB): Cranioplasty with retrieval of bone flap from abdominal pocket (N/A)  Patient location during evaluation: PACU Anesthesia Type: General Level of consciousness: awake and alert Pain management: pain level controlled Vital Signs Assessment: post-procedure vital signs reviewed and stable Respiratory status: spontaneous breathing, nonlabored ventilation, respiratory function stable and patient connected to nasal cannula oxygen Cardiovascular status: blood pressure returned to baseline and stable Postop Assessment: no signs of nausea or vomiting Anesthetic complications: no    Last Vitals:  Filed Vitals:   09/15/15 1645 09/15/15 1658  BP:    Pulse: 82 82  Temp:  36.6 C  Resp: 23 23    Last Pain:  Filed Vitals:   09/15/15 1658  PainSc: Asleep                 Effie Berkshire

## 2015-09-15 NOTE — Progress Notes (Signed)
   09/15/15 0900  Clinical Encounter Type  Visited With Patient;Patient and family together;Health care provider  Visit Type Initial;Psychological support;Spiritual support;Social support  Referral From Chaplain;Nurse  Consult/Referral To Chaplain;Faith community  Spiritual Encounters  Spiritual Needs Emotional  Stress Factors  Patient Stress Factors Exhausted;Family relationships;Financial concerns;Health changes  Family Stress Factors Exhausted;Family relationships   Chaplain visited with patient before surgery. Chaplain provided emotional and spiritual care via prayer. Please page chaplain if Pt. is in need of more support.   Thanks,  [employer].

## 2015-09-15 NOTE — Anesthesia Preprocedure Evaluation (Addendum)
Anesthesia Evaluation  Patient identified by MRN, date of birth, ID band Patient awake    Reviewed: Allergy & Precautions, NPO status , Patient's Chart, lab work & pertinent test results  History of Anesthesia Complications Negative for: history of anesthetic complications  Airway Mallampati: II  TM Distance: >3 FB Neck ROM: Full    Dental  (+) Edentulous Upper, Edentulous Lower, Dental Advisory Given   Pulmonary COPD,  COPD inhaler, Current Smoker,    breath sounds clear to auscultation       Cardiovascular hypertension, Pt. on medications and Pt. on home beta blockers  Rhythm:Regular     Neuro/Psych  Headaches, Seizures -, Poorly Controlled,  PSYCHIATRIC DISORDERS Anxiety Depression Schizophrenia Left sided weakness CVA, Residual Symptoms    GI/Hepatic GERD  Medicated,(+) Cirrhosis     substance abuse  alcohol use, Hepatitis -, C  Endo/Other  diabetesMorbid obesity  Renal/GU      Musculoskeletal   Abdominal   Peds  Hematology   Anesthesia Other Findings   Reproductive/Obstetrics                            Anesthesia Physical  Anesthesia Plan  ASA: III  Anesthesia Plan: General   Post-op Pain Management:    Induction: Intravenous  Airway Management Planned: Oral ETT  Additional Equipment:   Intra-op Plan:   Post-operative Plan: Extubation in OR  Informed Consent: I have reviewed the patients History and Physical, chart, labs and discussed the procedure including the risks, benefits and alternatives for the proposed anesthesia with the patient or authorized representative who has indicated his/her understanding and acceptance.   Dental advisory given  Plan Discussed with: CRNA, Anesthesiologist and Surgeon  Anesthesia Plan Comments:         Anesthesia Quick Evaluation

## 2015-09-15 NOTE — Care Management Note (Addendum)
Case Management Note  Patient Details  Name: [name] MRN: [id number] Date of Birth: 1955-11-28  Subjective/Objective:                    Action/Plan: Patient was admitted for her bone flap replacement following recent cranioplasty.  Patient is Active with [facility] for HHRN/PT/OT.  [facility] was notified of patient's admission.  CM will continue to follow for discharge needs pending PT/OT evals and physician orders.  Expected Discharge Date:                  Expected Discharge Plan:  [place]  In-House Referral:     Discharge planning Services     Post Acute Care Choice:    Choice offered to:     DME Arranged:    DME Agency:     HH Arranged:    [place] Agency:     Status of Service:  In process, will continue to follow  Medicare Important Message Given:    Date Medicare IM Given:    Medicare IM give by:    Date Additional Medicare IM Given:    Additional Medicare Important Message give by:     If discussed at [place] of Stay Meetings, dates discussed:    Additional CommentsRolm Baptise, RN 09/15/2015, 10:25 AM [phone]

## 2015-09-15 NOTE — Progress Notes (Signed)
C/O of itching, paged MD with order of benadryl. C/O of throbbing headache, paged MD with order of morphine. Feeling anxious about going to sugery in AM states "Im scared, my sister died with the same surgery."  Provided reassurance. Requested to speak to chaplain before surgery. Will request chaplain service.  Will continue to monitor.

## 2015-09-15 NOTE — Op Note (Signed)
09/14/2015 - 09/15/2015  4:21 PM  PATIENT:  [name]  60 y.o. female whom underwent a craniectomy for a subdural hematoma evacuation on 2/18. The bone flap was placed in the abdomen. She is admitted for a cranioplasty.  PRE-OPERATIVE DIAGNOSIS:  skull defect  POST-OPERATIVE DIAGNOSIS:  skull defect  PROCEDURE:  Procedure(s): Cranioplasty with retrieval of bone flap from abdominal pocket  SURGEON: Surgeon(s): Ashok Pall, MD Kristeen Miss, MD  ASSISTANTS:Elsner, Mallie Mussel  ANESTHESIA:   general  EBL:  Total I/O In: 800 [I.V.:800] Out: 700 [Urine:450; Blood:250]  BLOOD ADMINISTERED:none  CELL SAVER GIVEN:none  COUNT:per nursing  DRAINS: (1) Jackson-Pratt drain(s) with closed bulb suction in the subgaleal space   SPECIMEN:  No Specimen  DICTATION: [name] was taken to the operating room, intubated, and placed under a general anesthetic without difficulty. female Her head was shaved, prepped, and draped  in a sterile manner, and the abdominal incision was prepped and draped also. She was positioned supine with her head on a doughnut. Dr. Ellene Route opened the abdominal incision and retrieved the bone flap. He then cleaned the bone flap, and the cavity the bone was in. He closed the wound in layers approximating the subcutaneous, and subcuticular layers. I at the same time opened the scalp incision, and dissected the temporalis muscle, and galea from the dural substitute overlying the brain. The brain looked quite good. Dr. Ellene Route also assisted with the scalp dissection. Once we had exposed the skull we replaced the bone flap, using plates and screws. We then approximated the scalp in layers closing the galea, and scalp edges. I used vicryl sutures for the galea, and a nylon for the scalp edges. I placed a jackson pratt drain in the subgaleal space, and brought it through the scalp via a separate stab incision. I placed a sterile dressing on the head. [name] was extubated and moving  all extremities.   PLAN OF CARE: Admit to inpatient   PATIENT DISPOSITION:  PACU - hemodynamically stable.   Delay start of Pharmacological VTE agent (>24hrs) due to surgical blood loss or risk of bleeding:  yes

## 2015-09-16 ENCOUNTER — Inpatient Hospital Stay (HOSPITAL_COMMUNITY): Payer: Medicare Other

## 2015-09-16 ENCOUNTER — Encounter (HOSPITAL_COMMUNITY): Payer: Self-pay | Admitting: Radiology

## 2015-09-16 LAB — COMPREHENSIVE METABOLIC PANEL
ALK PHOS: 145 U/L — AB (ref 38–126)
ALT: 18 U/L (ref 14–54)
ANION GAP: 13 (ref 5–15)
AST: 41 U/L (ref 15–41)
Albumin: 2.5 g/dL — ABNORMAL LOW (ref 3.5–5.0)
BILIRUBIN TOTAL: 0.7 mg/dL (ref 0.3–1.2)
BUN: 7 mg/dL (ref 6–20)
CALCIUM: 8.6 mg/dL — AB (ref 8.9–10.3)
CO2: 24 mmol/L (ref 22–32)
Chloride: 103 mmol/L (ref 101–111)
Creatinine, Ser: 1.17 mg/dL — ABNORMAL HIGH (ref 0.44–1.00)
GFR calc non Af Amer: 50 mL/min — ABNORMAL LOW (ref >60)
GFR, EST AFRICAN AMERICAN: 58 mL/min — AB (ref >60)
Glucose, Bld: 106 mg/dL — ABNORMAL HIGH (ref 65–99)
POTASSIUM: 3.9 mmol/L (ref 3.5–5.1)
SODIUM: 140 mmol/L (ref 135–145)
TOTAL PROTEIN: 5.9 g/dL — AB (ref 6.5–8.1)

## 2015-09-16 LAB — CK TOTAL AND CKMB (NOT AT ARMC)
CK, MB: 3.9 ng/mL (ref 0.5–5.0)
Relative Index: 1.5 (ref 0.0–2.5)
Total CK: 255 U/L — ABNORMAL HIGH (ref 38–234)

## 2015-09-16 LAB — GLUCOSE, CAPILLARY: GLUCOSE-CAPILLARY: 118 mg/dL — AB (ref 65–99)

## 2015-09-16 LAB — LACTIC ACID, PLASMA: LACTIC ACID, VENOUS: 1.8 mmol/L (ref 0.5–2.0)

## 2015-09-16 LAB — TROPONIN I: Troponin I: <0.03 ng/mL (ref <0.031)

## 2015-09-16 MED ORDER — OXYCODONE HCL 5 MG PO TABS
10.0000 mg | ORAL_TABLET | ORAL | Status: DC | PRN
  Administered 2015-09-16 – 2015-09-23 (×16): 10 mg via ORAL
  Filled 2015-09-16 (×16): qty 2

## 2015-09-16 MED ORDER — MORPHINE SULFATE (PF) 2 MG/ML IV SOLN
2.0000 mg | Freq: Once | INTRAVENOUS | Status: AC
  Administered 2015-09-16: 2 mg via INTRAVENOUS

## 2015-09-16 MED ORDER — SODIUM CHLORIDE 0.9 % IV BOLUS (SEPSIS)
500.0000 mL | Freq: Once | INTRAVENOUS | Status: AC
  Administered 2015-09-16: 500 mL via INTRAVENOUS

## 2015-09-16 MED ORDER — IOHEXOL 350 MG/ML SOLN
80.0000 mL | Freq: Once | INTRAVENOUS | Status: AC | PRN
  Administered 2015-09-16: 80 mL via INTRAVENOUS

## 2015-09-16 MED ORDER — ASPIRIN 81 MG PO CHEW
81.0000 mg | CHEWABLE_TABLET | Freq: Once | ORAL | Status: AC
  Administered 2015-09-16: 81 mg via ORAL
  Filled 2015-09-16: qty 1

## 2015-09-16 NOTE — Progress Notes (Signed)
Pt transferred to room 5C10.

## 2015-09-16 NOTE — Progress Notes (Signed)
Patient ID: [name], female   DOB: [birth year], 60 y.o.   MRN: [id number] [place] is doing well postop day 1 from replacement of her craniotomy flap  Awake alert oriented strength 5 out of 5 incision clean dry and intact  I discontinued her J-P drain will transfer her to the floor physical and occupational therapy

## 2015-09-16 NOTE — Progress Notes (Signed)
Pt transferred in room via bed, pt on contact precaution for MRSA, NSL, oriented pt to room and vitals taken.

## 2015-09-16 NOTE — Progress Notes (Signed)
Patient ID: [name], female   DOB: 1956/04/01, 60 y.o.   MRN: [id number] Called to see patient who was complaining of chest pain shortness of breath and was having systolic pressures in the low 80s. She says she feels like somebody standing on her chest. She has a history of COPD and she was recently transferred from the ICU to the floor after undergoing operation yesterday for reimplantation of cranial bone flap. Neurologically she seems to be intact I have spoken to cardiology on call who are at the bedside we are obtaining an EKG cardiac enzymes we have given her an aspirin and some morphine and we will be transferring her back to the ICU

## 2015-09-16 NOTE — Consult Note (Signed)
CARDIOLOGY INPATIENT CONSULTATION NOTE  Patient ID: [name] MRN: [id number], DOB/AGE: Sep 26, 1955   Admit date: 09/14/2015   Primary Physician: Pcp Not In System Primary Cardiologist: none  Reason for Consult:   Hypotension and chest pain   Requesting Physician: Kary Kos MD   HPI: This is a 60 y.o.African American female with prior history of MI s/p LHC in 2008 w/o PCI who recently had subdural hematoma evacuation with bone flap two days ago. Today she was moved from ICU to floor where she developed sudden onset chest pain, which was sharp in character, present in a point location on the left side of the ribs, gets worse with deep breathing and cough and is improved little with morphine. Patient said that this pain is new for her and is not similar to the chest pain she had in 2008 when she had cardiac cath. She also has greenish productive sputum and cough ongoing for the last 1 week which has been getting worse. She is worried if she has developed pneumonia. She was given IVF and her BP responded well to it. There was no significantly loss of blood during the surgery. Her creatinine was elevated.   Problem List: Past Medical History  Diagnosis Date  . Emphysema of lung ([place])   . Depression   . Diverticulitis   . Chronic bronchitis ([place])   . Malignant brain tumor ([place]) 2007  . GERD (gastroesophageal reflux disease)   . Allergy   . Hepatitis C   . Hypertension   . Chronic kidney disease   . Migraines   . Urine incontinence   . Seizures ([place])   . Stroke [facility]) 2007    during brain surgery  . Pancreatitis, alcoholic 0000000  . Rectovaginal fistula   . H/O ETOH abuse     Sober since 2009  . H/O drug abuse     Clean since 2009  . Pancreatic ascites   . Cirrhosis ([place])   . Diabetes mellitus without complication [facility])     Past Surgical History  Procedure Laterality Date  . Appendectomy  1999  . Eye surgery    . Rectovaginal fistula repair w/ colostomy  2011  . Brain  surgery  2007    malignant brain tumor  . Cholecystectomy  2001  . Abdominal hysterectomy  1979  . Craniotomy Right 08/16/2015    Procedure: Right Fronto-Temporal-Parietal Craniotomy for Evacuation of Hematoma with Bone Flap Placement in Abdomen;  Surgeon: Ashok Pall, MD;  Location: [place] [facility];  Service: Neurosurgery;  Laterality: Right;     Allergies:  Allergies  Allergen Reactions  . Ibuprofen Other (See Comments)    Cannot take because of liver disease   . Sulfa Antibiotics Hives  . Amoxicillin Rash  . Chocolate Rash  . Ciprofloxacin Itching  . Dilaudid [Hydromorphone Hcl] Itching  . Fentanyl Rash  . Hydromorphone Itching  . Metformin Nausea Only  . Penicillins Rash and Other (See Comments)    Has patient had a PCN reaction causing immediate rash, facial/tongue/throat swelling, SOB or lightheadedness with hypotension: [id number] Has patient had a PCN reaction causing severe rash involving mucus membranes or skin necrosis: [id number] Has patient had a PCN reaction that required hospitalization [id number] Has patient had a PCN reaction occurring within the last 10 years: [id number] If all of the above answers are "NO", then may proceed with Cephalosporin use.   . Strawberry (Diagnostic) Rash  . Strawberry Extract Rash  . Zofran [Ondansetron Hcl] Itching and Nausea And  Vomiting    Do not use; vomiting per pt  . Zofran [Ondansetron] Other (See Comments)    Do not use; vomiting per pt Unlikely due to Zofran     Home Medications Current Facility-Administered Medications  Medication Dose Route Frequency Provider Last Rate Last Dose  . albuterol (PROVENTIL) (2.5 MG/3ML) 0.083% nebulizer solution 2.5 mg  2.5 mg Inhalation Q4H PRN Ashok Pall, MD      . amitriptyline (ELAVIL) tablet 50 mg  50 mg Oral QHS Ashok Pall, MD   50 mg at 09/15/15 2030  . aspirin chewable tablet 81 mg  81 mg Oral Once Kary Kos, MD      . Chlorhexidine Gluconate Cloth 2 % PADS 6 each  6  each Topical Q0600 Ashok Pall, MD   6 each at 09/16/15 0600  . diphenhydrAMINE (BENADRYL) capsule 25 mg  25 mg Oral Q6H PRN Ashok Pall, MD   25 mg at 09/16/15 1829  . furosemide (LASIX) tablet 20 mg  20 mg Oral Daily Ashok Pall, MD   20 mg at 09/16/15 1002  . gabapentin (NEURONTIN) capsule 300 mg  300 mg Oral TID Ashok Pall, MD   300 mg at 09/16/15 1821  . HYDROcodone-acetaminophen (NORCO/VICODIN) 5-325 MG per tablet 1 tablet  1 tablet Oral Q4H PRN Ashok Pall, MD      . hydrocortisone cream 0.5 % 1 application  1 application Topical BID PRN Ashok Pall, MD   1 application at [id number]  . ipratropium-albuterol (DUONEB) 0.5-2.5 (3) MG/3ML nebulizer solution 3 mL  3 mL Inhalation Q6H PRN Ashok Pall, MD      . labetalol (NORMODYNE,TRANDATE) injection 10-40 mg  10-40 mg Intravenous Q10 min PRN Ashok Pall, MD      . lactulose (CHRONULAC) 10 GM/15ML solution 20 g  20 g Oral Daily PRN Ashok Pall, MD      . levETIRAcetam (KEPPRA) tablet 500 mg  500 mg Oral BID Ashok Pall, MD   500 mg at 09/16/15 0817  . lipase/protease/amylase (CREON) capsule 24,000 Units  24,000 Units Oral TID AC Ashok Pall, MD   24,000 Units at 09/16/15 1820  . metoprolol succinate (TOPROL-XL) 24 hr tablet 25 mg  25 mg Oral Daily Ashok Pall, MD   25 mg at 09/15/15 0927  . mometasone-formoterol (DULERA) 200-5 MCG/ACT inhaler 2 puff  2 puff Inhalation BID Ashok Pall, MD   2 puff at 09/16/15 2151  . morphine 2 MG/ML injection 1-2 mg  1-2 mg Intravenous Q2H PRN Ashok Pall, MD   2 mg at 09/16/15 0741  . mupirocin ointment (BACTROBAN) 2 % 1 application  1 application Nasal BID Ashok Pall, MD   1 application at [id number]  . naloxone [facility]) injection 0.08 mg  0.08 mg Intravenous PRN Ashok Pall, MD      . oxyCODONE (Oxy IR/ROXICODONE) immediate release tablet 10 mg  10 mg Oral Q4H PRN Kary Kos, MD   10 mg at 09/16/15 1821  . oxyCODONE (OXYCONTIN) 12 hr tablet 20 mg  20 mg Oral Q12H Ashok Pall, MD   20 mg  at 09/16/15 1142  . pantoprazole (PROTONIX) EC tablet 40 mg  40 mg Oral Daily Ashok Pall, MD   40 mg at 09/16/15 1002  . pravastatin (PRAVACHOL) tablet 10 mg  10 mg Oral Daily Ashok Pall, MD   10 mg at 09/16/15 1002  . promethazine (PHENERGAN) tablet 12.5-25 mg  12.5-25 mg Oral Q4H PRN Ashok Pall, MD      .  sertraline (ZOLOFT) tablet 50 mg  50 mg Oral Daily Ashok Pall, MD   50 mg at 09/16/15 1002  . sodium chloride 0.9 % bolus 500 mL  500 mL Intravenous Once Flossie Dibble, MD      . tiotropium [facility]) inhalation capsule 18 mcg  18 mcg Inhalation BID PRN Ashok Pall, MD      . triamcinolone (NASACORT) nasal inhaler 2 spray  2 spray Nasal Daily PRN Ashok Pall, MD      . zolpidem (AMBIEN) tablet 5 mg  5 mg Oral QHS Ashok Pall, MD   5 mg at 09/15/15 2030     Family History  Problem Relation Age of Onset  . Hypertension Mother   . Heart disease Father   . Diabetes Father   . Cancer Sister     brain  . Cancer Grandchild 8    brain tumor     Social History   Social History  . Marital Status: Divorced    Spouse Name: N/A  . Number of Children: N/A  . Years of Education: N/A   Occupational History  . Not on file.   Social History Main Topics  . Smoking status: Current Some Day Smoker -- 0.25 packs/day    Types: Cigarettes  . Smokeless tobacco: Never Used     Comment: cutting back  . Alcohol Use: No     Comment: no alcohol since 2013  . Drug Use: No  . Sexual Activity: Not Currently    Birth Control/ Protection: Other-see comments     Comment: hysterectomy   Other Topics Concern  . Not on file   Social History Narrative   Lives in [place] with husband, has 4 sons (2 in prison).              Review of Systems: General: fatigue, no change in weight negative for chills, fever, night sweats  Cardiovascular: leg edema, dyspnea but no chest pain  Dermatological: negative for rash Respiratory: productive cough negative for wheezing  Urologic: negative  for hematuria Abdominal: negative for nausea, vomiting, diarrhea, bright red blood per rectum, melena, or hematemesis Neurologic: negative for visual changes, syncope, or dizziness Hematology: anemia  Psychiatry: non suicidal/homicidal  Musculoskeletal: back pain   Physical Exam: Vitals: BP 66/55 mmHg  Pulse 66  Temp(Src) 99.7 F (37.6 C) (Oral)  Resp 20  Ht 5\' 8"  (1.727 m)  Wt 119.2 kg (262 lb 12.6 oz)  BMI 39.97 kg/m2  SpO2 93% General: not in acute distress Neck: JVP flat, neck supple Heart: regular rate and rhythm, S1, S2, no murmurs  Lungs: bilateral wheezing and coarse crackles on the bases probably atelectasis GI: non tender, non distended, bowel sounds present Extremities: no edema Neuro: AAO x 3  Psych: normal affect, no anxiety   Labs:   Results for orders placed or performed during the hospital encounter of 09/14/15 (from the past 24 hour(s))  Glucose, capillary     Status: Abnormal   Collection Time: 09/16/15 11:40 AM  Result Value Ref Range   Glucose-Capillary 118 (H) 65 - 99 mg/dL  CK total and CKMB (cardiac)not at [facility]     Status: Abnormal   Collection Time: 09/16/15 10:03 PM  Result Value Ref Range   Total CK 255 (H) 38 - 234 U/L   CK, MB 3.9 0.5 - 5.0 ng/mL   Relative Index 1.5 0.0 - 2.5  Troponin I (q 6hr x 3)     Status: None   Collection Time: 09/16/15  10:03 PM  Result Value Ref Range   Troponin I <0.03 <0.031 ng/mL  Lactic acid, plasma     Status: None   Collection Time: 09/16/15 10:05 PM  Result Value Ref Range   Lactic Acid, Venous 1.8 0.5 - 2.0 mmol/L  Comprehensive metabolic panel     Status: Abnormal   Collection Time: 09/16/15 10:08 PM  Result Value Ref Range   Sodium 140 135 - 145 mmol/L   Potassium 3.9 3.5 - 5.1 mmol/L   Chloride 103 101 - 111 mmol/L   CO2 24 22 - 32 mmol/L   Glucose, Bld 106 (H) 65 - 99 mg/dL   BUN 7 6 - 20 mg/dL   Creatinine, Ser 1.17 (H) 0.44 - 1.00 mg/dL   Calcium 8.6 (L) 8.9 - 10.3 mg/dL   Total Protein 5.9  (L) 6.5 - 8.1 g/dL   Albumin 2.5 (L) 3.5 - 5.0 g/dL   AST 41 15 - 41 U/L   ALT 18 14 - 54 U/L   Alkaline Phosphatase 145 (H) 38 - 126 U/L   Total Bilirubin 0.7 0.3 - 1.2 mg/dL   GFR calc non Af Amer 50 (L) >60 mL/min   GFR calc Af Amer 58 (L) >60 mL/min   Anion gap 13 5 - 15     Radiology/Studies: Ct Head Wo Contrast  09/14/2015  CLINICAL DATA:  Preop for cranioplasty. Headaches. Previous subdural hematoma. EXAM: CT HEAD WITHOUT CONTRAST TECHNIQUE: Contiguous axial images were obtained from the base of the skull through the vertex without intravenous contrast. COMPARISON:  Multiple priors. FINDINGS: No acute stroke, acute hemorrhage, mass lesion, hydrocephalus, or extra-axial fluid. Large RIGHT frontotemporal craniectomy defect, with a bilobed hypodense subgaleal effusion or pseudomeningocele measuring up to 14 mm thick, and 81 mm anterior-posterior. No residual subdural hematoma. Baseline atrophy. Hypoattenuation of white matter, likely chronic microvascular ischemic change. Chronic LEFT frontotemporal infarction stable. Previous LEFT frontotemporal craniotomy appears uncomplicated. No sinus air-fluid level. Dense lenticular opacities. Dysconjugate gaze. Suspected previous LEFT blowout injury. IMPRESSION: Postsurgical and posttraumatic changes as described. Hypodense subgaleal fluid on the RIGHT is associated with a large craniectomy defect. See discussion above. No residual subdural hematoma. Electronically Signed   By: Staci Righter M.D.   On: 09/14/2015 19:16   Ct Head Wo Contrast  08/22/2015  CLINICAL DATA:  Subdural hematoma.  Pain in the lower jaw EXAM: CT HEAD WITHOUT CONTRAST TECHNIQUE: Contiguous axial images were obtained from the base of the skull through the vertex without intravenous contrast. COMPARISON:  08/16/2015 head CT FINDINGS: Skull and Sinuses:Wide craniectomy on the right. The scalp and bulging meninges are thickened, as expected postoperatively. There is likely tracking CSF  into the scalp deep to the temporalis. Visualized orbits: Negative. Brain: Excellent evacuation of right subdural hematoma with significantly improved midline shift (some shift related to volume loss on the left as seen on 2015 study). Midline shift measures 5 mm at maximum today at the anterior septum pellucidum. There is no intraventricular or subarachnoid hemorrhage. Edematous appearance of brain deep to the craniectomy but no convincing postoperative infarct. Stable encephalomalacia and gliosis deep to the left pterional craniotomy, mainly affecting the temporal pole and frontal operculum. IMPRESSION: 1. Evacuation of right subdural hematoma with significantly improved shift. 2. 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 Electronically Signed   By: Monte Fantasia M.D.   On: 08/22/2015 09:10   Ct Soft Tissue Neck Wo Contrast  08/22/2015  CLINICAL DATA:  Recent craniotomy for subdural hematoma. Right-sided parietal headache. Swelling and  redness and pain to the right mandible. EXAM: CT NECK WITHOUT CONTRAST TECHNIQUE: Multidetector CT imaging of the neck was performed following the standard protocol without intravenous contrast. COMPARISON:  CT head 08/16/2015. FINDINGS: Pharynx and larynx: No focal mucosal or submucosal lesions are evident. Vocal cords are midline and symmetric. The tongue base is within normal limits. There is asymmetric stranding within the right parapharyngeal fat. Salivary glands: There is marked edematous change in stranding about the right parotid gland. Enlarged intra parotid and periparotid lymph nodes appear reactive. They left parotid gland is within normal limits. The submandibular glands are normal bilaterally. Thyroid: Within normal limits Lymph nodes: Enlarged right level 2 periparotid lymph nodes appear reactive. Right submandibular nodes are present as well. No significant left-sided cervical adenopathy is present. Vascular: A right IJ catheter is in place. No  significant atherosclerotic disease is evident. Limited intracranial: The left craniotomy is again noted. Edema within the left frontal lobe and left temporal tip are again noted. Previously-seen hemorrhage has been evacuated. No new hemorrhage is evident. Visualized orbits: Within normal limits Mastoids and visualized paranasal sinuses: A polyp or mucous retention cyst is again noted in the left maxillary sinus. Skeleton: There straightening of the normal cervical lordosis. The patient is edentulous. No focal lytic or blastic lesions are present. Upper chest: Is patchy bilateral ground-glass attenuation is present. There is a small effusion on the right. IMPRESSION: 1. Marked inflammatory changes appear to be centered in the right parotid gland. There is no focal mass lesion or obstruction. The findings are compatible with a nonspecific parotitis. 2. Right level 1 and level 2 lymph nodes appear reactive. 3. Postsurgical changes of left craniotomy. 4. Right IJ line. 5. Patchy ground-glass attenuation the lung apices bilaterally concerning for edema or infection. Electronically Signed   By: San Morelle M.D.   On: 08/22/2015 09:28   Dg Chest Port 1 View  09/16/2015  CLINICAL DATA:  Chest pain, shortness of Breath. EXAM: PORTABLE CHEST 1 VIEW COMPARISON:  08/20/2015 FINDINGS: Linear subsegmental atelectasis within the lingula. Right base atelectasis. Heart is upper limits normal in size. No effusions or acute bony abnormality. No edema. IMPRESSION: Areas of atelectasis bilaterally.  No acute cardiopulmonary disease. Electronically Signed   By: Rolm Baptise M.D.   On: 09/16/2015 20:55   Dg Chest Port 1 View  08/20/2015  CLINICAL DATA:  60 year old female with central line placement. EXAM: PORTABLE CHEST 1 VIEW COMPARISON:  Radiograph dated 05/21/2015 FINDINGS: A right IJ central venous catheter with tip over central SVC is noted. There is no pneumothorax. Mild central vascular prominence may represent a  degree of congestion. Linear left mid lung field atelectasis/scarring. No focal consolidation, or pleural effusion noted. There is stable cardiac silhouette. The osseous structures appear unremarkable. IMPRESSION: Right IJ central venous line with tip over central SVC. No pneumothorax. Probable mild congestive changes.  No focal consolidation. Electronically Signed   By: Anner Crete M.D.   On: 08/20/2015 20:19    EKG: pending  Echo: pending  Cardiac cath: not available  Medical decision making:  Discussed care with the patient Discussed care with the physician on the phone Reviewed labs and imaging personally Reviewed prior records  ASSESSMENT AND PLAN:  This is a 60 y.o. female prior history of CAD who developed musculoskeletal chest pain and was hypotensive today with elevated creatinine.     Active Problems:   Subdural hematoma (HCC)   Hypotension, likely hypovlemic, BUN/Cr ratio is elevated Given IVF, good response to  IVF Checking lactic acid, urinalysis, CXR to r/o any active infection, if found will need Abx  Chest pain, localized pain, musculoskeletal in nature from cough, non cardiac Cycle troponin to r/o ACS, treat with morphine, warm compresses at the site  Wheezing bilaterally, secondary to COPD exacerbation Checking CXR to r/o pneumonia Nebs prn ordered  Chronic coronary artery disease Continue aspirin, high dose statin    Signed, Flossie Dibble, MD MS 09/16/2015, 11:46 PM

## 2015-09-17 DIAGNOSIS — R062 Wheezing: Secondary | ICD-10-CM | POA: Insufficient documentation

## 2015-09-17 DIAGNOSIS — R071 Chest pain on breathing: Secondary | ICD-10-CM | POA: Insufficient documentation

## 2015-09-17 DIAGNOSIS — I951 Orthostatic hypotension: Secondary | ICD-10-CM

## 2015-09-17 DIAGNOSIS — R0602 Shortness of breath: Secondary | ICD-10-CM | POA: Insufficient documentation

## 2015-09-17 DIAGNOSIS — R0600 Dyspnea, unspecified: Secondary | ICD-10-CM

## 2015-09-17 LAB — TROPONIN I: Troponin I: <0.03 ng/mL (ref <0.031)

## 2015-09-17 LAB — LACTIC ACID, PLASMA: LACTIC ACID, VENOUS: 1.4 mmol/L (ref 0.5–2.0)

## 2015-09-17 MED ORDER — DIPHENHYDRAMINE HCL 25 MG PO CAPS: 25.0000 mg | ORAL_CAPSULE | Freq: Four times a day (QID) | ORAL | Status: DC | PRN

## 2015-09-17 MED ORDER — BUDESONIDE 0.5 MG/2ML IN SUSP
0.5000 mg | Freq: Two times a day (BID) | RESPIRATORY_TRACT | Status: DC
  Administered 2015-09-17 – 2015-09-22 (×8): 0.5 mg via RESPIRATORY_TRACT
  Filled 2015-09-17 (×13): qty 2

## 2015-09-17 MED ORDER — ALBUTEROL SULFATE (2.5 MG/3ML) 0.083% IN NEBU: 2.5000 mg | INHALATION_SOLUTION | Freq: Four times a day (QID) | RESPIRATORY_TRACT | Status: DC

## 2015-09-17 MED ORDER — IPRATROPIUM-ALBUTEROL 0.5-2.5 (3) MG/3ML IN SOLN
RESPIRATORY_TRACT | Status: AC
  Administered 2015-09-17: 3 mL via RESPIRATORY_TRACT
  Filled 2015-09-17: qty 3

## 2015-09-17 MED ORDER — IPRATROPIUM-ALBUTEROL 0.5-2.5 (3) MG/3ML IN SOLN
3.0000 mL | Freq: Four times a day (QID) | RESPIRATORY_TRACT | Status: DC
  Administered 2015-09-17 – 2015-09-19 (×7): 3 mL via RESPIRATORY_TRACT
  Filled 2015-09-17 (×8): qty 3

## 2015-09-17 MED ORDER — SODIUM CHLORIDE 0.9 % IV BOLUS (SEPSIS)
1000.0000 mL | Freq: Once | INTRAVENOUS | Status: AC
  Administered 2015-09-17: 1000 mL via INTRAVENOUS

## 2015-09-17 MED ORDER — LEVOFLOXACIN IN D5W 500 MG/100ML IV SOLN
500.0000 mg | INTRAVENOUS | Status: DC
  Administered 2015-09-17 – 2015-09-18 (×2): 500 mg via INTRAVENOUS
  Filled 2015-09-17 (×2): qty 100

## 2015-09-17 MED ORDER — ALBUTEROL SULFATE (2.5 MG/3ML) 0.083% IN NEBU
2.5000 mg | INHALATION_SOLUTION | RESPIRATORY_TRACT | Status: DC | PRN
  Administered 2015-09-22: 2.5 mg via RESPIRATORY_TRACT
  Filled 2015-09-17: qty 3

## 2015-09-17 NOTE — Consult Note (Signed)
PULMONARY / CRITICAL CARE MEDICINE   Name: [name] MRN: [id number] DOB: 1955/10/03    ADMISSION DATE:  09/14/2015 CONSULTATION DATE:  09/17/15  REFERRING MD:  Dr. Saintclair Halsted  CHIEF COMPLAINT:  Shortness of breath  HISTORY OF PRESENT ILLNESS:   [name] is a 60 y/o woman with PMH of COPD as well as conditions listed below who was admitted for replacement of cranial bone flap.  She underwent surgery on 3/9 and was on the floor when she developed acute shortness of breath and hypotension.  She was evaluated by cardiology who felt that her symptoms were more likely pulmonary in nature after she had a normal EKG and normal cardiac enzymes.  She does have COPD and uses albuterol nebulizers 4 times a day at home.  She also reports a cough that is productive of dark yellow sputum.   PAST MEDICAL HISTORY :  She  has a past medical history of Emphysema of lung ([place]); Depression; Diverticulitis; Chronic bronchitis ([place]); Malignant brain tumor ([place]) (2007); GERD (gastroesophageal reflux disease); Allergy; Hepatitis C; Hypertension; Chronic kidney disease; Migraines; Urine incontinence; Seizures ([place]); Stroke [facility]) (2007); Pancreatitis, alcoholic (0000000); Rectovaginal fistula; H/O ETOH abuse; H/O drug abuse; Pancreatic ascites; Cirrhosis ([place]); and Diabetes mellitus without complication ([place]).  PAST SURGICAL HISTORY: She  has past surgical history that includes Appendectomy (1999); Eye surgery; rectovaginal fistula repair w/ colostomy (2011); Brain surgery (2007); Cholecystectomy (2001); Abdominal hysterectomy (1979); and Craniotomy (Right, 08/16/2015).  Allergies  Allergen Reactions  . Ibuprofen Other (See Comments)    Cannot take because of liver disease   . Sulfa Antibiotics Hives  . Amoxicillin Rash  . Chocolate Rash  . Ciprofloxacin Itching  . Dilaudid [Hydromorphone Hcl] Itching  . Fentanyl Rash  . Hydromorphone Itching  . Metformin Nausea Only  . Penicillins Rash and Other (See Comments)   Has patient had a PCN reaction causing immediate rash, facial/tongue/throat swelling, SOB or lightheadedness with hypotension: [id number] Has patient had a PCN reaction causing severe rash involving mucus membranes or skin necrosis: [id number] Has patient had a PCN reaction that required hospitalization [id number] Has patient had a PCN reaction occurring within the last 10 years: [id number] If all of the above answers are "NO", then may proceed with Cephalosporin use.   . Strawberry (Diagnostic) Rash  . Strawberry Extract Rash  . Zofran [Ondansetron Hcl] Itching and Nausea And Vomiting    Do not use; vomiting per pt  . Zofran [Ondansetron] Other (See Comments)    Do not use; vomiting per pt Unlikely due to Zofran    No current facility-administered medications on file prior to encounter.   Current Outpatient Prescriptions on File Prior to Encounter  Medication Sig  . ADVAIR DISKUS 500-50 MCG/DOSE AEPB inhale 1 dose by mouth twice a day  . albuterol (PROAIR HFA) 108 (90 BASE) MCG/ACT inhaler Inhale 2 puffs into the lungs every 4 (four) hours as needed for wheezing.   Marland Kitchen amitriptyline (ELAVIL) 50 MG tablet Take 1 tablet (50 mg total) by mouth at bedtime.  . Bismuth Subsalicylate (KAOPECTATE EXTRA STRENGTH) 525 MG/15ML SUSP Take 15 mLs (525 mg total) by mouth every 2 (two) hours as needed.  Marland Kitchen CREON 12000 UNITS CPEP Take 24,000 Units by mouth 3 (three) times daily before meals.   . furosemide (LASIX) 20 MG tablet Take 20 mg by mouth daily.  Marland Kitchen gabapentin (NEURONTIN) 300 MG capsule Take 300 mg by mouth 3 (three) times daily.   . hydrocortisone cream 0.5 % Apply 1 application  topically 2 (two) times daily as needed for itching.  . Ipratropium-Albuterol (COMBIVENT) 20-100 MCG/ACT AERS respimat Inhale 1 puff into the lungs every 6 (six) hours as needed for wheezing or shortness of breath.   . lactulose (CHRONULAC) 10 GM/15ML solution Take 20 g by mouth daily as needed for mild  constipation.   . levETIRAcetam (KEPPRA) 500 MG tablet Take 1 tablet (500 mg total) by mouth 2 (two) times daily.  . metoprolol succinate (TOPROL-XL) 25 MG 24 hr tablet Take 1 tablet (25 mg total) by mouth daily.  Marland Kitchen omeprazole (PRILOSEC) 20 MG capsule Take 1 capsule (20 mg total) by mouth daily.  Marland Kitchen oxyCODONE (OXY IR/ROXICODONE) 5 MG immediate release tablet Take 1 tablet (5 mg total) by mouth every 6 (six) hours as needed for moderate pain.  Marland Kitchen oxyCODONE (OXYCONTIN) 20 mg 12 hr tablet Take 1 tablet (20 mg total) by mouth every 12 (twelve) hours.  . Oxycodone HCl 20 MG TABS Take 1 tablet by mouth every 8 (eight) hours as needed.  . pravastatin (PRAVACHOL) 10 MG tablet Take 10 mg by mouth daily.   . sertraline (ZOLOFT) 50 MG tablet Take 50 mg by mouth daily.   Marland Kitchen tiotropium (SPIRIVA) 18 MCG inhalation capsule Place 18 mcg into inhaler and inhale 2 (two) times daily as needed (shortness of breath).   . triamcinolone (NASACORT ALLERGY 24HR) 55 MCG/ACT AERO nasal inhaler Place 2 sprays into the nose daily as needed (allergies).  . zolpidem (AMBIEN) 10 MG tablet Take 1 tablet (10 mg total) by mouth at bedtime.    FAMILY HISTORY:  Her indicated that her mother is alive. She indicated that her father is deceased. She indicated that her sister is deceased. She indicated that her grandchild is deceased.   SOCIAL HISTORY: She  reports that she has been smoking Cigarettes.  She has been smoking about 0.25 packs per day. She has never used smokeless tobacco. She reports that she does not drink alcohol or use illicit drugs.  REVIEW OF SYSTEMS:   She complains of cough, pain in her LUQ and pain at her surgical site.  SUBJECTIVE:  60 y/o woman with COPD and now shortness of breath  VITAL SIGNS: BP 109/72 mmHg  Pulse 94  Temp(Src) 100.2 F (37.9 C) (Oral)  Resp 20  Ht 5\' 8"  (1.727 m)  Wt 119.2 kg (262 lb 12.6 oz)  BMI 39.97 kg/m2  SpO2 94%  HEMODYNAMICS:    VENTILATOR SETTINGS:    INTAKE /  OUTPUT: I/O last 3 completed shifts: In: 2380 [P.O.:480; I.V.:1800; IV Piggyback:100] Out: 2268 [Urine:1902; Drains:115; Stool:1; Blood:250]  PHYSICAL EXAMINATION: General:  Appears older than stated age, anxious Neuro:  Alert and oriented, conversant, answers all questions appropriately HEENT:  PERRL, EOMI, OP clear, sutures over scalp, edentulous Cardiovascular:  NRRR, no MRG Lungs:  Bibasilar crackles, no wheezing, prolonged expiratory phase,  Abdomen:  Soft, tender in LUQ, +BS Musculoskeletal:  No joint abnormalities Skin:  No rashes, bruises or other lesions.  LABS:  BMET  Recent Labs Lab 09/16/15 2208  NA 140  K 3.9  CL 103  CO2 24  BUN 7  CREATININE 1.17*  GLUCOSE 106*    Electrolytes  Recent Labs Lab 09/16/15 2208  CALCIUM 8.6*    CBC No results for input(s): WBC, HGB, HCT, PLT in the last 168 hours.  Coag's No results for input(s): APTT, INR in the last 168 hours.  Sepsis Markers  Recent Labs Lab 09/16/15 2205 09/17/15 0047  LATICACIDVEN 1.8  1.4    ABG No results for input(s): PHART, PCO2ART, PO2ART in the last 168 hours.  Liver Enzymes  Recent Labs Lab 09/16/15 2208  AST 41  ALT 18  ALKPHOS 145*  BILITOT 0.7  ALBUMIN 2.5*    Cardiac Enzymes  Recent Labs Lab 09/16/15 2203  TROPONINI <0.03    Glucose  Recent Labs Lab 09/15/15 1155 09/16/15 1140  GLUCAP 73 118*    Imaging Ct Angio Chest Pe W/cm &/or Wo Cm  09/17/2015  CLINICAL DATA:  Postop cranioplasty yesterday. Chest tightness, shortness of breath and chest pressure today. EXAM: CT ANGIOGRAPHY CHEST WITH CONTRAST TECHNIQUE: Multidetector CT imaging of the chest was performed using the standard protocol during bolus administration of intravenous contrast. Multiplanar CT image reconstructions and MIPs were obtained to evaluate the vascular anatomy. CONTRAST:  27mL OMNIPAQUE IOHEXOL 350 MG/ML SOLN COMPARISON:  Radiograph earlier this day.  Chest CT 09/30/2014 FINDINGS:  There are no filling defects within the pulmonary arteries to suggest pulmonary embolus. Normal caliber thoracic aorta without dissection. Moderate atherosclerosis. Coronary artery calcifications are seen. There is mild multi chamber cardiomegaly. Small bilateral pleural effusions, right greater than left. No pericardial effusion. No mediastinal or hilar adenopathy. Dependent densities in both lungs consistent with hypoventilatory change. Mild compressive atelectasis in both lower lobes. No findings of pulmonary edema. Mild bronchial thickening. Nodular hepatic contours in the upper abdomen consistent with cirrhosis. Left adrenal nodule is partially included, grossly unchanged. There is splenomegaly. There are no acute or suspicious osseous abnormalities. Review of the MIP images confirms the above findings. IMPRESSION: 1. No pulmonary embolus. 2. Scattered atelectasis with small bilateral pleural effusions. 3. Atherosclerosis including coronary artery calcifications. Mild multi chamber cardiomegaly. No findings to suggest CHF. Electronically Signed   By: Jeb Levering M.D.   On: 09/17/2015 00:08   Dg Chest Port 1 View  09/16/2015  CLINICAL DATA:  Chest pain, shortness of Breath. EXAM: PORTABLE CHEST 1 VIEW COMPARISON:  08/20/2015 FINDINGS: Linear subsegmental atelectasis within the lingula. Right base atelectasis. Heart is upper limits normal in size. No effusions or acute bony abnormality. No edema. IMPRESSION: Areas of atelectasis bilaterally.  No acute cardiopulmonary disease. Electronically Signed   By: Rolm Baptise M.D.   On: 09/16/2015 20:55     STUDIES:  CTA chest 09/17/15 - no PE  CULTURES:   ANTIBIOTICS: levaquin - 312 >>>  SIGNIFICANT EVENTS:   LINES/TUBES:   DISCUSSION: 60 y/o woman with COPD and productive cough.  Possible bronchitis vs. COPD exacerbation  ASSESSMENT / PLAN:  PULMONARY A: COPD P:   QID scheduled albuterol nebs, q2h PRN levaquin for COPD  exacerbation/bronchitis No PE on CT scan Recommend incentive spirometer.   I spent 35 minutes of critical care time in the care of this patient seperate from procedures which are documented elsewhere    Pulmonary and [place] Pager: [phone]  09/17/2015, 5:29 AM

## 2015-09-17 NOTE — Progress Notes (Signed)
Subjective: Patient reports Feeling better less trouble breathing  Objective: Vital signs in last 24 hours: Temp:  [98.8 F (37.1 C)-100.2 F (37.9 C)] 98.8 F (37.1 C) (03/12 0750) Pulse Rate:  [66-94] 90 (03/12 0900) Resp:  [14-33] 15 (03/12 0900) BP: (66-109)/(38-82) 84/57 mmHg (03/12 0900) SpO2:  [92 %-98 %] 98 % (03/12 0900)  Intake/Output from previous day: 03/11 0701 - 03/12 0700 In: 895 [P.O.:720; I.V.:75; IV Piggyback:100] Out: 154 [Urine:153; Stool:1] Intake/Output this shift: Total I/O In: 1000 [IV Piggyback:1000] Out: 1 [Urine:1]  Patient is awake and alert oriented neurologically nonfocal. Lung sounds more clear  Lab Results: No results for input(s): WBC, HGB, HCT, PLT in the last 72 hours. BMET  Recent Labs  09/16/15 2208  NA 140  K 3.9  CL 103  CO2 24  GLUCOSE 106*  BUN 7  CREATININE 1.17*  CALCIUM 8.6*    Studies/Results: Ct Angio Chest Pe W/cm &/or Wo Cm  09/17/2015  CLINICAL DATA:  Postop cranioplasty yesterday. Chest tightness, shortness of breath and chest pressure today. EXAM: CT ANGIOGRAPHY CHEST WITH CONTRAST TECHNIQUE: Multidetector CT imaging of the chest was performed using the standard protocol during bolus administration of intravenous contrast. Multiplanar CT image reconstructions and MIPs were obtained to evaluate the vascular anatomy. CONTRAST:  62mL OMNIPAQUE IOHEXOL 350 MG/ML SOLN COMPARISON:  Radiograph earlier this day.  Chest CT 09/30/2014 FINDINGS: There are no filling defects within the pulmonary arteries to suggest pulmonary embolus. Normal caliber thoracic aorta without dissection. Moderate atherosclerosis. Coronary artery calcifications are seen. There is mild multi chamber cardiomegaly. Small bilateral pleural effusions, right greater than left. No pericardial effusion. No mediastinal or hilar adenopathy. Dependent densities in both lungs consistent with hypoventilatory change. Mild compressive atelectasis in both lower lobes. No  findings of pulmonary edema. Mild bronchial thickening. Nodular hepatic contours in the upper abdomen consistent with cirrhosis. Left adrenal nodule is partially included, grossly unchanged. There is splenomegaly. There are no acute or suspicious osseous abnormalities. Review of the MIP images confirms the above findings. IMPRESSION: 1. No pulmonary embolus. 2. Scattered atelectasis with small bilateral pleural effusions. 3. Atherosclerosis including coronary artery calcifications. Mild multi chamber cardiomegaly. No findings to suggest CHF. Electronically Signed   By: Jeb Levering M.D.   On: 09/17/2015 00:08   Dg Chest Port 1 View  09/16/2015  CLINICAL DATA:  Chest pain, shortness of Breath. EXAM: PORTABLE CHEST 1 VIEW COMPARISON:  08/20/2015 FINDINGS: Linear subsegmental atelectasis within the lingula. Right base atelectasis. Heart is upper limits normal in size. No effusions or acute bony abnormality. No edema. IMPRESSION: Areas of atelectasis bilaterally.  No acute cardiopulmonary disease. Electronically Signed   By: Rolm Baptise M.D.   On: 09/16/2015 20:55    Assessment/Plan: 60 year old female postop day 2 from reimplantation of cranial bone flap. Episode last night or shortness of breath chest pain hypotension ruled out for an MI also ruled out for PE felt this might be more consistent with relative hypovolemia and exacerbation of COPD. Appreciate both cardiology and critical-care's assistance age and transferred to the unit and much better this morning we'll continue observing the unit until tomorrow.  LOS: 3 days     Camryn Quesinberry P 09/17/2015, 9:40 AM

## 2015-09-17 NOTE — Progress Notes (Signed)
When patient arrived to floor about 2045, vitals were taken and BP was 83/46 and shortly pt c/o of chest pain 9/10 sharp LL chest, and SOB, 2L O2 placed sats was 100%, called Dr. Saintclair Halsted, orders given for EKG, 2mg  of morphine, 81mg  ASA and orders carried out. Pain was not relieved by morphine. Cardiologist and Dr. Saintclair Halsted came to see pt. CXR and lab drawn, 500cc bolus given, then called report to [place] at 3100. Transferred pt via bed with [place] NT to 3114.

## 2015-09-17 NOTE — Progress Notes (Signed)
Pt states she does not wear cpap at home and does not want to wear here; encouraged her to call if she changes her mind.

## 2015-09-17 NOTE — Progress Notes (Signed)
PULMONARY / CRITICAL CARE MEDICINE   Name: [name] MRN: [id number] DOB: Mar 20, 1956    ADMISSION DATE:  09/14/2015 CONSULTATION DATE:  09/17/2015  REFERRING MD:  Neurosurgery  CHIEF COMPLAINT:  Short of breath  SUBJECTIVE:  Feels better.  Sore in stomach.  Denies chest pain.  Feels sleepy.  VITAL SIGNS: BP 105/60 mmHg  Pulse 87  Temp(Src) 100.2 F (37.9 C) (Oral)  Resp 19  Ht 5\' 8"  (1.727 m)  Wt 262 lb 12.6 oz (119.2 kg)  BMI 39.97 kg/m2  SpO2 97%  INTAKE / OUTPUT: I/O last 3 completed shifts: In: [id number] [P.O.:720; I.V.:900; IV Piggyback:200] Out: 1034 [Urine:978; Drains:55; Stool:1]  PHYSICAL EXAMINATION: General:  sleepy Neuro:  Wakes up easily, follows commands HEENT:  No stridor Cardiovascular:  regular Lungs:  Decreased BS at bases Abdomen:  Soft, wound site clean Musculoskeletal:  No edema Skin:  No rashes  LABS:  BMET  Recent Labs Lab 09/16/15 2208  NA 140  K 3.9  CL 103  CO2 24  BUN 7  CREATININE 1.17*  GLUCOSE 106*    Electrolytes  Recent Labs Lab 09/16/15 2208  CALCIUM 8.6*    CBC No results for input(s): WBC, HGB, HCT, PLT in the last 168 hours.  Coag's No results for input(s): APTT, INR in the last 168 hours.  Sepsis Markers  Recent Labs Lab 09/16/15 2205 09/17/15 0047  LATICACIDVEN 1.8 1.4    ABG No results for input(s): PHART, PCO2ART, PO2ART in the last 168 hours.  Liver Enzymes  Recent Labs Lab 09/16/15 2208  AST 41  ALT 18  ALKPHOS 145*  BILITOT 0.7  ALBUMIN 2.5*    Cardiac Enzymes  Recent Labs Lab 09/16/15 2203 09/17/15 0501  TROPONINI <0.03 <0.03    Glucose  Recent Labs Lab 09/15/15 1155 09/16/15 1140  GLUCAP 73 118*    Imaging Ct Angio Chest Pe W/cm &/or Wo Cm  09/17/2015  CLINICAL DATA:  Postop cranioplasty yesterday. Chest tightness, shortness of breath and chest pressure today. EXAM: CT ANGIOGRAPHY CHEST WITH CONTRAST TECHNIQUE: Multidetector CT imaging of the chest was performed  using the standard protocol during bolus administration of intravenous contrast. Multiplanar CT image reconstructions and MIPs were obtained to evaluate the vascular anatomy. CONTRAST:  40mL OMNIPAQUE IOHEXOL 350 MG/ML SOLN COMPARISON:  Radiograph earlier this day.  Chest CT 09/30/2014 FINDINGS: There are no filling defects within the pulmonary arteries to suggest pulmonary embolus. Normal caliber thoracic aorta without dissection. Moderate atherosclerosis. Coronary artery calcifications are seen. There is mild multi chamber cardiomegaly. Small bilateral pleural effusions, right greater than left. No pericardial effusion. No mediastinal or hilar adenopathy. Dependent densities in both lungs consistent with hypoventilatory change. Mild compressive atelectasis in both lower lobes. No findings of pulmonary edema. Mild bronchial thickening. Nodular hepatic contours in the upper abdomen consistent with cirrhosis. Left adrenal nodule is partially included, grossly unchanged. There is splenomegaly. There are no acute or suspicious osseous abnormalities. Review of the MIP images confirms the above findings. IMPRESSION: 1. No pulmonary embolus. 2. Scattered atelectasis with small bilateral pleural effusions. 3. Atherosclerosis including coronary artery calcifications. Mild multi chamber cardiomegaly. No findings to suggest CHF. Electronically Signed   By: Jeb Levering M.D.   On: 09/17/2015 00:08   Dg Chest Port 1 View  09/16/2015  CLINICAL DATA:  Chest pain, shortness of Breath. EXAM: PORTABLE CHEST 1 VIEW COMPARISON:  08/20/2015 FINDINGS: Linear subsegmental atelectasis within the lingula. Right base atelectasis. Heart is upper limits normal in size. No  effusions or acute bony abnormality. No edema. IMPRESSION: Areas of atelectasis bilaterally.  No acute cardiopulmonary disease. Electronically Signed   By: Rolm Baptise M.D.   On: 09/16/2015 20:55     STUDIES:  3/12 CT angio chest >> no PE, b/l ATX with small  effusions  CULTURES:  ANTIBIOTICS: 3/10 Clindamycin >> 3/11 Levaquin >>  SIGNIFICANT EVENTS: 3/09 Admit 3/10 Cranioplasty with retrieval of bone flap  LINES/TUBES:  DISCUSSION: 60 yo female with lt sided weakness from Rt sided panhemispheric fluid collection.  She had evacuation of SDH and bone flap in her abdomen.  She was readmitted for cranioplasty.  She developed chest pain and dyspnea, and PCCM consulted.  PMhx of Hep C, ETOH, cirrhosis, pancreatitis, Depression, GERD, HTN, CKD, CVA, DM  ASSESSMENT / PLAN:  Dyspnea. Tobacco abuse with hx of AECOPD. Probable OSA. Atelectasis. P:   Duoneb, pulmicort nebulizer for now Oxygen to keep SpO2 > 92% Bronchial hygiene Try on CPAP qhs Day 1 of levaquin  Hypotension likely from hypovolemia. P: Normal saline bolus Hold lasix  S/p cranioplasty. P: Per neurosurgery Clindamycin per neurosurgery   Chesley Mires, MD [place] 09/17/2015, 7:58 AM Pager:  [phone] After 3pm call: [phone]

## 2015-09-18 ENCOUNTER — Encounter (HOSPITAL_COMMUNITY): Payer: Self-pay | Admitting: Neurosurgery

## 2015-09-18 DIAGNOSIS — J449 Chronic obstructive pulmonary disease, unspecified: Secondary | ICD-10-CM

## 2015-09-18 DIAGNOSIS — R0602 Shortness of breath: Secondary | ICD-10-CM

## 2015-09-18 LAB — GLUCOSE, CAPILLARY: Glucose-Capillary: 92 mg/dL (ref 65–99)

## 2015-09-18 NOTE — Progress Notes (Addendum)
PULMONARY / CRITICAL CARE MEDICINE   Name: [name] MRN: [id number] DOB: June 26, 1956    ADMISSION DATE:  09/14/2015 CONSULTATION DATE:  09/17/2015  REFERRING MD:  Neurosurgery  CHIEF COMPLAINT:  Short of breath  SUBJECTIVE:  Feels better.  Sore in stomach.  Denies chest pain.  Feels sleepy.  VITAL SIGNS: BP 99/67 mmHg  Pulse 98  Temp(Src) 98.7 F (37.1 C) (Oral)  Resp 12  Ht 5\' 8"  (1.727 m)  Wt 119.2 kg (262 lb 12.6 oz)  BMI 39.97 kg/m2  SpO2 94%  INTAKE / OUTPUT: I/O last 3 completed shifts: In: 1440 [P.O.:240; IV Piggyback:1200] Out: 4 [Urine:4]  PHYSICAL EXAMINATION: General:  sleepy Neuro:  Wakes up easily, follows commands HEENT:  No stridor Cardiovascular:  regular Lungs:  Decreased BS at bases Abdomen:  Soft, wound site clean Musculoskeletal:  No edema Skin:  No rashes  LABS:  BMET  Recent Labs Lab 09/16/15 2208  NA 140  K 3.9  CL 103  CO2 24  BUN 7  CREATININE 1.17*  GLUCOSE 106*    Electrolytes  Recent Labs Lab 09/16/15 2208  CALCIUM 8.6*    CBC No results for input(s): WBC, HGB, HCT, PLT in the last 168 hours.  Coag's No results for input(s): APTT, INR in the last 168 hours.  Sepsis Markers  Recent Labs Lab 09/16/15 2205 09/17/15 0047  LATICACIDVEN 1.8 1.4    ABG No results for input(s): PHART, PCO2ART, PO2ART in the last 168 hours.  Liver Enzymes  Recent Labs Lab 09/16/15 2208  AST 41  ALT 18  ALKPHOS 145*  BILITOT 0.7  ALBUMIN 2.5*    Cardiac Enzymes  Recent Labs Lab 09/16/15 2203 09/17/15 0501 09/17/15 1010  TROPONINI <0.03 <0.03 <0.03    Glucose  Recent Labs Lab 09/15/15 1155 09/15/15 1629 09/16/15 1140  GLUCAP 73 92 118*    Imaging No results found.   STUDIES:  3/12 CT angio chest >> no PE, b/l ATX with small effusions  CULTURES:  ANTIBIOTICS: 3/10 Clindamycin >> 3/11 Levaquin >>  SIGNIFICANT EVENTS: 3/09 Admit 3/10 Cranioplasty with retrieval of bone  flap  LINES/TUBES:  DISCUSSION: 60 yo female with lt sided weakness from Rt sided panhemispheric fluid collection.  She had evacuation of SDH and bone flap in her abdomen.  She was readmitted for cranioplasty.  She developed chest pain and dyspnea, and PCCM consulted 3/12.  PMhx of Hep C, ETOH, cirrhosis, pancreatitis, Depression, GERD, HTN, CKD, CVA, DM  ASSESSMENT / PLAN:  Dyspnea. Tobacco abuse with hx COPD. Probable OSA. Atelectasis. P:   Duoneb, pulmicort nebulizer for now; back to home Advair and Spiriva at time of discharge.  Defer systemic steroids Oxygen to keep SpO2 > 92% Bronchial hygiene Refused CPAP hs 3/12 Day 2 of levaquin >> d/c on 3/13  Hypotension likely from hypovolemia. P: Normal saline bolus Hold lasix for now given decreased PO. ? Restart 3/14 Continue metoprolol for now Careful w oxycontin    S/p cranioplasty. P: Clindamycin finished per neurosurgery keppra as ordered   Looks OK to transfer out of ICU from my standpoint, Dr Christella Noa to eval today.   Please call us if we can help further.   Baltazar Apo, MD, PhD 09/18/2015, 11:00 AM [facility] [phone] or if no answer [phone]

## 2015-09-18 NOTE — Progress Notes (Signed)
Patient Profile: 60 y.o.African American female with prior history of MI s/p LHC in 2008 w/o PCI who recently had subdural hematoma evacuation with bone flap 09/15/15. On post op day 2, patient complained of atypical CP an hypotension, for which cardiology was consulted.   Subjective: Still with atypical chest pain. Sharp, worse with certain positions and deep breathing. Point tenderness that is reproducible with palpation of chest wall beneath her left breast.   Objective: Vital signs in last 24 hours: Temp:  [98.7 F (37.1 C)-100.5 F (38.1 C)] 98.7 F (37.1 C) (03/13 0800) Pulse Rate:  [84-111] 98 (03/13 1000) Resp:  [12-26] 12 (03/13 1000) BP: (70-111)/(45-95) 99/67 mmHg (03/13 1000) SpO2:  [85 %-100 %] 94 % (03/13 1000) Last BM Date: 09/18/15  Intake/Output from previous day: 03/12 0701 - 03/13 0700 In: 1100 [IV Piggyback:1100] Out: 3 [Urine:3] Intake/Output this shift: Total I/O In: 240 [P.O.:240] Out: -   Medications Current Facility-Administered Medications  Medication Dose Route Frequency Provider Last Rate Last Dose  . albuterol (PROVENTIL) (2.5 MG/3ML) 0.083% nebulizer solution 2.5 mg  2.5 mg Nebulization Q2H PRN Guy Begin, MD      . amitriptyline (ELAVIL) tablet 50 mg  50 mg Oral QHS Ashok Pall, MD   50 mg at 09/17/15 2141  . budesonide (PULMICORT) nebulizer solution 0.5 mg  0.5 mg Nebulization BID Chesley Mires, MD   0.5 mg at 09/18/15 0814  . Chlorhexidine Gluconate Cloth 2 % PADS 6 each  6 each Topical Q0600 Ashok Pall, MD   6 each at 09/18/15 0612  . diphenhydrAMINE (BENADRYL) capsule 25 mg  25 mg Oral Q6H PRN Ashok Pall, MD   25 mg at 09/17/15 0120  . gabapentin (NEURONTIN) capsule 300 mg  300 mg Oral TID Ashok Pall, MD   300 mg at 09/18/15 1000  . HYDROcodone-acetaminophen (NORCO/VICODIN) 5-325 MG per tablet 1 tablet  1 tablet Oral Q4H PRN Ashok Pall, MD      . hydrocortisone cream 0.5 % 1 application  1 application Topical BID PRN Ashok Pall, MD   1 application at [id number]  . ipratropium-albuterol (DUONEB) 0.5-2.5 (3) MG/3ML nebulizer solution 3 mL  3 mL Nebulization Q6H WA Chesley Mires, MD   3 mL at 09/18/15 0814  . labetalol (NORMODYNE,TRANDATE) injection 10-40 mg  10-40 mg Intravenous Q10 min PRN Ashok Pall, MD      . lactulose (CHRONULAC) 10 GM/15ML solution 20 g  20 g Oral Daily PRN Ashok Pall, MD      . levETIRAcetam (KEPPRA) tablet 500 mg  500 mg Oral BID Ashok Pall, MD   500 mg at 09/18/15 1000  . lipase/protease/amylase (CREON) capsule 24,000 Units  24,000 Units Oral TID AC Ashok Pall, MD   24,000 Units at 09/18/15 0805  . metoprolol succinate (TOPROL-XL) 24 hr tablet 25 mg  25 mg Oral Daily Ashok Pall, MD   Stopped at 09/18/15 [phone]  . morphine 2 MG/ML injection 1-2 mg  1-2 mg Intravenous Q2H PRN Ashok Pall, MD   2 mg at 09/16/15 0741  . mupirocin ointment (BACTROBAN) 2 % 1 application  1 application Nasal BID Ashok Pall, MD   1 application at [id number] 1000  . naloxone (NARCAN) injection 0.08 mg  0.08 mg Intravenous PRN Ashok Pall, MD      . oxyCODONE (Oxy IR/ROXICODONE) immediate release tablet 10 mg  10 mg Oral Q4H PRN Kary Kos, MD   10 mg at 09/18/15 0612  . oxyCODONE (OXYCONTIN) 12  hr tablet 20 mg  20 mg Oral Q12H Ashok Pall, MD   20 mg at 09/18/15 1000  . pantoprazole (PROTONIX) EC tablet 40 mg  40 mg Oral Daily Ashok Pall, MD   40 mg at 09/18/15 1000  . pravastatin (PRAVACHOL) tablet 10 mg  10 mg Oral Daily Ashok Pall, MD   10 mg at 09/18/15 0959  . promethazine (PHENERGAN) tablet 12.5-25 mg  12.5-25 mg Oral Q4H PRN Ashok Pall, MD      . sertraline (ZOLOFT) tablet 50 mg  50 mg Oral Daily Ashok Pall, MD   50 mg at 09/18/15 0958  . triamcinolone (NASACORT) nasal inhaler 2 spray  2 spray Nasal Daily PRN Ashok Pall, MD      . zolpidem (AMBIEN) tablet 5 mg  5 mg Oral QHS Ashok Pall, MD   5 mg at 09/17/15 2218    PE: General appearance: alert, cooperative, no distress and shaved  head with cranial scar Neck: no carotid bruit and no JVD Chest Wall: point tenderness with a palpation of chest wall beneath left breast Lungs: clear to auscultation bilaterally Heart: regular rate and rhythm Extremities: no LEE Pulses: 2+ and symmetric Skin: warm and dry Neurologic: Grossly normal  Lab Results:  No results for input(s): WBC, HGB, HCT, PLT in the last 72 hours. BMET  Recent Labs  09/16/15 2208  NA 140  K 3.9  CL 103  CO2 24  GLUCOSE 106*  BUN 7  CREATININE 1.17*  CALCIUM 8.6*   Cardiac Panel (last 3 results)  Recent Labs  09/16/15 2203 09/17/15 0501 09/17/15 1010  CKTOTAL 255*  --   --   CKMB 3.9  --   --   TROPONINI <0.03 <0.03 <0.03  RELINDX 1.5  --   --      Assessment/Plan  Active Problems:   Subdural hematoma (HCC)   Chest pain on breathing   SOB (shortness of breath)   Wheezing   1. Atypical Chest Pain: described as sharp in character, present in a point location on the left side of the ribs, gets worse with deep breathing and cough and is improved little with morphine. Unlike her angina in 2008. Cardiac enzymes are negative x 3, ruling her out for MI. Symptoms are more c/w musculoskeletal pain. Can continue PRN pain meds and warm compresses at the site. No further cardiac w/u indicated.   2. CAD: h/o coronary PCI in 2008. Stable w/o anginal symptoms. Cardiac enzymes negative x 3. Continue BB and statin for secondary prevention.   3. Hypotension: likely hypovlemic, BUN/Cr ratio is elevated. Good response with IVFs. BP is soft but stable in the upper 0000000 systolic.   Patient appears stable from a cardiac standpoint. We will sign off. Please call if any additional assistance is needed.    LOS: 4 days    Brittainy M. Rosita Fire, PA-C 09/18/2015 11:48 AM  I have seen and examined the patient along with Brittainy M. Ladoris Gene,   I have reviewed the chart, notes and new data.  I agree with PA/NP's note.   PLAN: Musculoskeletal  anterior chest pain, low risk ecg and cardiac enzymes. No new recommendations. Please reconsult for new cardiac developments.  Sanda Klein, MD, [place] [phone] 09/18/2015, 4:58 PM

## 2015-09-18 NOTE — Progress Notes (Signed)
Patient ID: [name], female   DOB: Sep 10, 1955, 60 y.o.   MRN: [id number] BP 100/67 mmHg  Pulse 95  Temp(Src) 98.1 F (36.7 C) (Oral)  Resp 17  Ht 5\' 8"  (1.727 m)  Wt 119.2 kg (262 lb 12.6 oz)  BMI 39.97 kg/m2  SpO2 81% Alert and oriented x 4, speech is clear and fluent Moving extremities well Wound is clean, dry, and without signs of infection Much improved from the weekend.

## 2015-09-19 LAB — TYPE AND SCREEN
ABO/RH(D): A POS
ANTIBODY SCREEN: POSITIVE
DAT, IGG: NEGATIVE
DONOR AG TYPE: NEGATIVE
Donor AG Type: NEGATIVE
Unit division: 0
Unit division: 0
Unit division: 0

## 2015-09-19 NOTE — Progress Notes (Signed)
Patient's incision on head bleeding <1cm over staple, staple not damaged. Gauze placed over, patient encouraged to not touch surgical incision for pain and infection control.

## 2015-09-19 NOTE — Progress Notes (Signed)
Patient transferred to 5C13. Patient is alert, oriented x4, left arm and leg slightly weaker than right side, patient reports history of stroke which left her with left sided weakness. No numbness or tingling, denies pain. Oriented to room, unit, staff. Bed alarm on, call bell within patient's reach, will continue to monitor.

## 2015-09-19 NOTE — Progress Notes (Signed)
Patient has eaten 6 cups of vanilla ice cream since transfer to [place] [facility] at 4pm. 3MW RN reported that patient ate ice cream throughout night and day shift. Patient is stating that she now has diarrhea, RN educated patient that ice cream is possibly causing diarrhea, advised to restrict ice cream. Per nurse tech, patient's stool has been formed. MD at bedside per patient request

## 2015-09-19 NOTE — Progress Notes (Signed)
Patient ID: [name], female   DOB: 1955/11/23, 60 y.o.   MRN: [id number] BP 101/58 mmHg  Pulse 87  Temp(Src) 99.3 F (37.4 C) (Oral)  Resp 22  Ht 5\' 8"  (1.727 m)  Wt 119.2 kg (262 lb 12.6 oz)  BMI 39.97 kg/m2  SpO2 100% Alert and oriented x 4, speech is clear, and fluent Moving all extremities well Will consult ent to follow up on her parotid Wounds are clean, dry

## 2015-09-19 NOTE — Progress Notes (Signed)
Patient refused to wear CPAP HS. Patient is on RA with no distress noted. Nurse aware.

## 2015-09-20 MED ORDER — IPRATROPIUM-ALBUTEROL 0.5-2.5 (3) MG/3ML IN SOLN: 3.0000 mL | RESPIRATORY_TRACT | Status: DC | PRN

## 2015-09-20 NOTE — Progress Notes (Signed)
Patient ID: [name], female   DOB: [birth year], 60 y.o.   MRN: [id number] BP 145/94 mmHg  Pulse 59  Temp(Src) 99.1 F (37.3 C) (Oral)  Resp 18  Ht 5\' 8"  (1.727 m)  Wt 119.2 kg (262 lb 12.6 oz)  BMI 39.97 kg/m2  SpO2 98% Alert and oriented x 4 Will send cbc since she has had a mild fever. All wounds cranial, and abdominal are healing well without evidence of infection Moving all extremities well probAble discharge tomorrow.

## 2015-09-20 NOTE — Progress Notes (Signed)
Patient refuses to wear socks and  Has disabled her bed alarm. Patient was educated on the importance of calling and waiting on the staff for assistance. Patient is implosive and combative

## 2015-09-21 LAB — CBC WITH DIFFERENTIAL/PLATELET
BASOS PCT: 0 %
Basophils Absolute: 0 10*3/uL (ref 0.0–0.1)
EOS ABS: 0.3 10*3/uL (ref 0.0–0.7)
EOS PCT: 5 %
HCT: 19.8 % — ABNORMAL LOW (ref 36.0–46.0)
HEMOGLOBIN: 5.8 g/dL — AB (ref 12.0–15.0)
LYMPHS PCT: 32 %
Lymphs Abs: 1.7 10*3/uL (ref 0.7–4.0)
MCH: 21.3 pg — AB (ref 26.0–34.0)
MCHC: 29.3 g/dL — ABNORMAL LOW (ref 30.0–36.0)
MCV: 72.8 fL — AB (ref 78.0–100.0)
Monocytes Absolute: 0.8 10*3/uL (ref 0.1–1.0)
Monocytes Relative: 15 %
NEUTROS PCT: 48 %
Neutro Abs: 2.4 10*3/uL (ref 1.7–7.7)
Platelets: 149 10*3/uL — ABNORMAL LOW (ref 150–400)
RBC: 2.72 MIL/uL — AB (ref 3.87–5.11)
RDW: 19.7 % — ABNORMAL HIGH (ref 11.5–15.5)
WBC: 5.2 10*3/uL (ref 4.0–10.5)

## 2015-09-21 LAB — CBC
HCT: 21.6 % — ABNORMAL LOW (ref 36.0–46.0)
HEMOGLOBIN: 5.9 g/dL — AB (ref 12.0–15.0)
MCH: 20.1 pg — AB (ref 26.0–34.0)
MCHC: 27.3 g/dL — AB (ref 30.0–36.0)
MCV: 73.7 fL — ABNORMAL LOW (ref 78.0–100.0)
Platelets: 172 10*3/uL (ref 150–400)
RBC: 2.93 MIL/uL — ABNORMAL LOW (ref 3.87–5.11)
RDW: 19.5 % — ABNORMAL HIGH (ref 11.5–15.5)
WBC: 6 10*3/uL (ref 4.0–10.5)

## 2015-09-21 LAB — PROTIME-INR
INR: 1.35 (ref 0.00–1.49)
PROTHROMBIN TIME: 16.8 s — AB (ref 11.6–15.2)

## 2015-09-21 LAB — PREPARE RBC (CROSSMATCH)

## 2015-09-21 LAB — APTT: aPTT: 39 seconds — ABNORMAL HIGH (ref 24–37)

## 2015-09-21 MED ORDER — CLINDAMYCIN HCL 300 MG PO CAPS
300.0000 mg | ORAL_CAPSULE | Freq: Three times a day (TID) | ORAL | Status: DC
  Administered 2015-09-21 – 2015-09-23 (×7): 300 mg via ORAL
  Filled 2015-09-21 (×8): qty 1

## 2015-09-21 MED ORDER — DIPHENHYDRAMINE HCL 50 MG/ML IJ SOLN
25.0000 mg | Freq: Once | INTRAMUSCULAR | Status: AC
  Administered 2015-09-21: 25 mg via INTRAVENOUS
  Filled 2015-09-21: qty 1

## 2015-09-21 MED ORDER — ACETAMINOPHEN 325 MG PO TABS
650.0000 mg | ORAL_TABLET | Freq: Once | ORAL | Status: AC
  Administered 2015-09-21: 650 mg via ORAL
  Filled 2015-09-21: qty 2

## 2015-09-21 MED ORDER — FUROSEMIDE 10 MG/ML IJ SOLN
20.0000 mg | Freq: Once | INTRAMUSCULAR | Status: AC
  Administered 2015-09-21: 20 mg via INTRAVENOUS
  Filled 2015-09-21: qty 4

## 2015-09-21 MED ORDER — SODIUM CHLORIDE 0.9 % IV SOLN
Freq: Once | INTRAVENOUS | Status: AC
  Administered 2015-09-21: 17:00:00 via INTRAVENOUS

## 2015-09-21 NOTE — Progress Notes (Signed)
Md aware of Hgb critical level 5.8.

## 2015-09-21 NOTE — Progress Notes (Signed)
Patient ID: [name], female   DOB: 06-25-1956, 59 y.o.   MRN: [id number] BP 111/70 mmHg  Pulse 79  Temp(Src) 97.9 F (36.6 C) (Oral)  Resp 20  Ht 5\' 8"  (1.727 m)  Wt 119.2 kg (262 lb 12.6 oz)  BMI 39.97 kg/m2  SpO2 100% Alert and oriented x 4 Anemic on the am CBC, will transfuse 2 units prbc Discharge tomorrow Doing well overall

## 2015-09-21 NOTE — Progress Notes (Signed)
IV team consult order placed for IV access noted to left AC.  Phlebotomy to do labs prior to transfusion. Spoke with Santiago Glad at blood bank who called this nurse stating that it may take longer for type and screen 4-6 hours.  Pt stable, no noted distress. Will continue to monitor.

## 2015-09-21 NOTE — Care Management Note (Signed)
Case Management Note  Patient Details  Name: [name] MRN: [id number] Date of Birth: 24-Jan-1956  Subjective/Objective:                    Action/Plan: Pt with low HGB this am. CM will continue to follow for discharge needs.   Expected Discharge Date:                  Expected Discharge Plan:  [place]  In-House Referral:     Discharge planning Services     Post Acute Care Choice:    Choice offered to:     DME Arranged:    DME Agency:     HH Arranged:    [place] Agency:     Status of Service:  In process, will continue to follow  Medicare Important Message Given:    Date Medicare IM Given:    Medicare IM give by:    Date Additional Medicare IM Given:    Additional Medicare Important Message give by:     If discussed at [place] of Stay Meetings, dates discussed:    Additional Comments:  Pollie Friar, RN 09/21/2015, 10:31 AM

## 2015-09-21 NOTE — Progress Notes (Signed)
Called Md spoke to nurse, Theadora Rama to notify of critical hemoglobin results called in from the lab of 5.8.

## 2015-09-21 NOTE — Consult Note (Signed)
Reason for Consult: Parotid infection Referring Physician: Ashok Pall, MD  [name] is an 60 y.o. female.  HPI: History of swelling around the right parotid gland and tenderness for the past week or so. She it first became noticeable to her right after her most recent surgery which was a second stage cranioplasty. She is edentulous. She is not having trouble eating or swallowing. The pain has been moderate.  Past Medical History  Diagnosis Date  . Emphysema of lung ([place])   . Depression   . Diverticulitis   . Chronic bronchitis ([place])   . Malignant brain tumor ([place]) 2007  . GERD (gastroesophageal reflux disease)   . Allergy   . Hepatitis C   . Hypertension   . Chronic kidney disease   . Migraines   . Urine incontinence   . Seizures ([place])   . Stroke [facility]) 2007    during brain surgery  . Pancreatitis, alcoholic 0000000  . Rectovaginal fistula   . H/O ETOH abuse     Sober since 2009  . H/O drug abuse     Clean since 2009  . Pancreatic ascites   . Cirrhosis ([place])   . Diabetes mellitus without complication [facility])     Past Surgical History  Procedure Laterality Date  . Appendectomy  1999  . Eye surgery    . Rectovaginal fistula repair w/ colostomy  2011  . Brain surgery  2007    malignant brain tumor  . Cholecystectomy  2001  . Abdominal hysterectomy  1979  . Craniotomy Right 08/16/2015    Procedure: Right Fronto-Temporal-Parietal Craniotomy for Evacuation of Hematoma with Bone Flap Placement in Abdomen;  Surgeon: Ashok Pall, MD;  Location: [place] NEURO ORS;  Service: Neurosurgery;  Laterality: Right;  . Cranioplasty N/A 09/15/2015    Procedure: Cranioplasty with retrieval of bone flap from abdominal pocket;  Surgeon: Ashok Pall, MD;  Location: [place] NEURO ORS;  Service: Neurosurgery;  Laterality: N/A;  Cranioplasty with retrieval of bone flap from abdominal pocket    Family History  Problem Relation Age of Onset  . Hypertension Mother   . Heart disease Father   . Diabetes  Father   . Cancer Sister     brain  . Cancer Grandchild 8    brain tumor    Social History:  reports that she has been smoking Cigarettes.  She has been smoking about 0.25 packs per day. She has never used smokeless tobacco. She reports that she does not drink alcohol or use illicit drugs.  Allergies:  Allergies  Allergen Reactions  . Ibuprofen Other (See Comments)    Cannot take because of liver disease   . Sulfa Antibiotics Hives  . Amoxicillin Rash  . Chocolate Rash  . Ciprofloxacin Itching  . Dilaudid [Hydromorphone Hcl] Itching  . Fentanyl Rash  . Hydromorphone Itching  . Metformin Nausea Only  . Penicillins Rash and Other (See Comments)    Has patient had a PCN reaction causing immediate rash, facial/tongue/throat swelling, SOB or lightheadedness with hypotension: [id number] Has patient had a PCN reaction causing severe rash involving mucus membranes or skin necrosis: [id number] Has patient had a PCN reaction that required hospitalization [id number] Has patient had a PCN reaction occurring within the last 10 years: [id number] If all of the above answers are "NO", then may proceed with Cephalosporin use.   . Strawberry (Diagnostic) Rash  . Strawberry Extract Rash  . Zofran [Ondansetron Hcl] Itching and Nausea And Vomiting    Do not use;  vomiting per pt  . Zofran [Ondansetron] Other (See Comments)    Do not use; vomiting per pt Unlikely due to Zofran    Medications: Reviewed  No results found for this or any previous visit (from the past 48 hour(s)).  No results found.  [id number] except as listed in admit H&P  Blood pressure 107/61, pulse 73, temperature 99.3 F (37.4 C), temperature source Oral, resp. rate 18, height 5\' 8"  (1.727 m), weight 119.2 kg (262 lb 12.6 oz), SpO2 100 %.  PHYSICAL EXAM: Overall appearance:  Healthy appearing, in no distress Head:  Craniotomy scar healing nicely. Ears: External ears look normal. Nose: External nose is  healthy in appearance. Internal nasal exam free of any lesions or obstruction. Oral Cavity/Pharynx:  There are no mucosal lesions or masses identified. She is edentulous. There is clear saliva expressible from the right parotid duct. Larynx/Hypopharynx: Deferred Neuro:  No identifiable neurologic deficits. Neck: No palpable neck masses, except for shoddy upper cervical and parotid lymphadenitis.  Studies Reviewed: CT neck  Procedures: none   Assessment/Plan: History consistent with parotitis which seems to be resolving. Recommend a 10 day course of oral antibiotic. I will follow up as an outpatient in a week or so.  [name] 09/21/2015, 8:43 AM

## 2015-09-21 NOTE — Progress Notes (Signed)
Pt refusing to get  transfusion to be ordered by Md. Pt states that she wants to go home, called her family for a ride. Pt stated that she was terrified to get blood and doesn't want to get any blood.  Pt educated on blood transfusion process.  Pt informed of risks of going home AMA and after several attempts agreed to stay. Pt with no noted distress. Md office called spoke to nurse, was informed that Md is due to arrive for surgery and would notify him.  No order for type and screen or transfusion at this time. Will continue to monitor.

## 2015-09-22 LAB — CBC
HCT: 27.3 % — ABNORMAL LOW (ref 36.0–46.0)
Hemoglobin: 8.3 g/dL — ABNORMAL LOW (ref 12.0–15.0)
MCH: 22.7 pg — ABNORMAL LOW (ref 26.0–34.0)
MCHC: 30.4 g/dL (ref 30.0–36.0)
MCV: 74.6 fL — ABNORMAL LOW (ref 78.0–100.0)
PLATELETS: UNDETERMINED 10*3/uL (ref 150–400)
RBC: 3.66 MIL/uL — ABNORMAL LOW (ref 3.87–5.11)
RDW: 18.8 % — AB (ref 11.5–15.5)
WBC: 10.9 10*3/uL — ABNORMAL HIGH (ref 4.0–10.5)

## 2015-09-22 MED ORDER — HYDROMORPHONE HCL 1 MG/ML IJ SOLN
INTRAMUSCULAR | Status: AC
  Filled 2015-09-22: qty 1

## 2015-09-22 MED ORDER — OXYCODONE HCL 20 MG PO TABS: 1.0000 | ORAL_TABLET | Freq: Three times a day (TID) | ORAL | Status: DC | PRN

## 2015-09-22 MED ORDER — CLINDAMYCIN HCL 300 MG PO CAPS: 300.0000 mg | ORAL_CAPSULE | Freq: Three times a day (TID) | ORAL | Status: DC

## 2015-09-22 MED ORDER — OXYCODONE HCL 5 MG PO TABS: 5.0000 mg | ORAL_TABLET | Freq: Four times a day (QID) | ORAL | Status: DC | PRN

## 2015-09-22 MED ORDER — ALPRAZOLAM 1 MG PO TABS: 1.0000 mg | ORAL_TABLET | Freq: Three times a day (TID) | ORAL | Status: DC | PRN

## 2015-09-22 NOTE — Progress Notes (Signed)
Pt complaint of shortness of breath.   Assessment done pt calm, no noted distress 02 sat 99%. Pt noted productive cough with small amount of thick sputum. Lungs clear upon auscultation. Pt requested nebulizer administered effective relief. Will continue to monitor.

## 2015-09-22 NOTE — Discharge Instructions (Signed)
Craniotomy °Care After °Please read the instructions outlined below and refer to this sheet in the next few weeks. These discharge instructions provide you with general information on caring for yourself after you leave the hospital. Your surgeon may also give you specific instructions. While your treatment has been planned according to the most current medical practices available, unavoidable complications occasionally occur. If you have any problems or questions after discharge, please call your surgeon. °Although there are many types of brain surgery, recovery following craniotomy (surgical opening of the skull) is much the same for each. However, recovery depends on many factors. These include the type and severity of brain injury and the type of surgery. It also depends on any nervous system function problems (neurological deficits) before surgery. If the craniotomy was done for cancer, chemotherapy and radiation could follow. You could be in the hospital from 5 days to a couple weeks. This depends on the type of surgery, findings, and whether there are complications. °HOME CARE INSTRUCTIONS  °· It is not unusual to hear a clicking noise after a craniotomy, the plates and screws used to attach the bone flap can sometimes cause this. It is a normal occurrence if this does happen °· Do not drive for 10 days after the operation °· Your scalp may feel spongy for a while, because of fluid under it. This will gradually get better. Occasionally, the surgeon will not replace the bone that was removed to access the brain. If there is a bony defect, the surgeon will ask you to wear a helmet for protection. This is a discussion you should have with your surgeon prior to leaving the hospital (discharge). °· Numbness may persist in some areas of your scalp. °· Take all medications as directed. Sometimes steroids to control swelling are prescribed. Anticonvulsants to prevent seizures may also be given. Do not use alcohol,  other drugs, or medications unless your surgeon says it is OK. °· Keep the wound dry and clean. The wound may be washed gently with soap and water. Then, you may gently blot or dab it dry, without rubbing. Do not take baths, use swimming pools or hot tubs for 10 days, or as instructed by your caregiver. It is best to wait to see you surgeon at your first postoperative visit, and to get directions at that time. °· Only take over-the-counter or prescription medicines for pain, discomfort, or fever as directed by your caregiver. °· You may continue your normal diet, as directed. °· Walking is OK for exercise. Wait at least 3 months before you return to mild, non-contact sports or as your surgeon suggests. Contact sports should be avoided for at least 1 year, unless your surgeon says it is OK. °· If you are prescribed steroids, take them exactly as prescribed. If you start having a decrease in nervous system functions (neurological deficits) and headaches as the dose of steroids is reduced, tell your surgeon right away. °· When the anticonvulsant prescription is finished you no longer need to take it. °SEEK IMMEDIATE MEDICAL CARE IF:  °· You develop nausea, vomiting, severe headaches, confusion, or you have a seizure. °· You develop chest pain, a stiff neck, or difficulty breathing. °· There is redness, swelling, or increasing pain in the wound or pin insertion sites. °· You have an increase in swelling or bruising around the eyes. °· There is drainage or pus coming from the wound. °· You have an oral temperature above 102° F (38.9° C), not controlled by medicine. °·   You notice a foul smell coming from the wound or dressing. °· The wound breaks open (edges not staying together) after the stitches have been removed. °· You develop dizziness or fainting while standing. °· You develop a rash. °· You develop any reaction or side effects to the medications given. °Document Released: 09/24/2005 Document Revised: 09/16/2011  Document Reviewed: 07/03/2009 °[employer]® Patient Information ©2013 [employer]. ° °

## 2015-09-22 NOTE — Discharge Summary (Signed)
Physician Discharge Summary  Patient ID: [name] MRN: [id number] DOB/AGE: [birth year] 60 y.o.  Admit date: 09/14/2015 Discharge date: 09/22/2015  Admission Diagnoses:Subdural hematoma  Discharge Diagnoses:  Active Problems:   Subdural hematoma (HCC)   Chest pain on breathing   SOB (shortness of breath)   Wheezing   Discharged Condition: good  Hospital Course: Mrs. Riner was admitted for an uncomplicated cranioplasty. She has done well post op. Her wounds are clean, dry, and without signs of infection. She is ambulating, voiding, and tolerating a regular diet.  Treatments: surgery: Cranioplasty  Discharge Exam: Blood pressure 101/74, pulse 89, temperature 98.4 F (36.9 C), temperature source Oral, resp. rate 18, height 5\' 8"  (1.727 m), weight 119.2 kg (262 lb 12.6 oz), SpO2 96 %. General appearance: alert, cooperative and appears stated age Neurologic: Alert and oriented X 3, normal strength and tone. Normal symmetric reflexes. Normal coordination and gait  Disposition: 06-Home-Health Care Svc skull defect Discharge Instructions    For home use only DME continuous positive airway pressure (CPAP)    Complete by:  As directed   Patient has OSA or probable OSA:  Yes  Is the patient currently using CPAP in the home:  Yes  If yes (to question two):  Determine DME provider and inform them of any new orders/settings  Settings:  Autotitration  Signs and symptoms of probable OSA  (select all that apply):   Snoring Gasping during sleep    CPAP supplies needed:  Mask, headgear, cushions, filters, heated tubing and water chamber            Medication List    TAKE these medications        ADVAIR DISKUS 500-50 MCG/DOSE Aepb  Generic drug:  Fluticasone-Salmeterol  inhale 1 dose by mouth twice a day     ALPRAZolam 1 MG tablet  Commonly known as:  XANAX  Take 1 tablet (1 mg total) by mouth 3 (three) times daily as needed for anxiety.     amitriptyline 50 MG tablet   Commonly known as:  ELAVIL  Take 1 tablet (50 mg total) by mouth at bedtime.     Bismuth Subsalicylate [id number] Susp  Commonly known as:  KAOPECTATE EXTRA STRENGTH  Take 15 mLs (525 mg total) by mouth every 2 (two) hours as needed.     clindamycin 300 MG capsule  Commonly known as:  CLEOCIN  Take 1 capsule (300 mg total) by mouth every 8 (eight) hours.     CREON 12000 units Cpep capsule  Generic drug:  lipase/protease/amylase  Take 24,000 Units by mouth 3 (three) times daily before meals.     furosemide 20 MG tablet  Commonly known as:  LASIX  Take 20 mg by mouth daily.     gabapentin 300 MG capsule  Commonly known as:  NEURONTIN  Take 300 mg by mouth 3 (three) times daily.     hydrocortisone cream 0.5 %  Apply 1 application topically 2 (two) times daily as needed for itching.     Ipratropium-Albuterol 20-100 MCG/ACT Aers respimat  Commonly known as:  COMBIVENT  Inhale 1 puff into the lungs every 6 (six) hours as needed for wheezing or shortness of breath.     lactulose 10 GM/15ML solution  Commonly known as:  CHRONULAC  Take 20 g by mouth daily as needed for mild constipation.     levETIRAcetam 500 MG tablet  Commonly known as:  KEPPRA  Take 1 tablet (500 mg total) by mouth 2 (  two) times daily.     metoprolol succinate 25 MG 24 hr tablet  Commonly known as:  TOPROL-XL  Take 1 tablet (25 mg total) by mouth daily.     NASACORT ALLERGY 24HR 55 MCG/ACT Aero nasal inhaler  Generic drug:  triamcinolone  Place 2 sprays into the nose daily as needed (allergies).     omeprazole 20 MG capsule  Commonly known as:  PRILOSEC  Take 1 capsule (20 mg total) by mouth daily.     oxyCODONE 20 mg 12 hr tablet  Commonly known as:  OXYCONTIN  Take 1 tablet (20 mg total) by mouth every 12 (twelve) hours.     oxyCODONE 5 MG immediate release tablet  Commonly known as:  Oxy IR/ROXICODONE  Take 1 tablet (5 mg total) by mouth every 6 (six) hours as needed for moderate pain.      Oxycodone HCl 20 MG Tabs  Take 1 tablet (20 mg total) by mouth every 8 (eight) hours as needed (for pain).     pravastatin 10 MG tablet  Commonly known as:  PRAVACHOL  Take 10 mg by mouth daily.     PROAIR HFA 108 (90 Base) MCG/ACT inhaler  Generic drug:  albuterol  Inhale 2 puffs into the lungs every 4 (four) hours as needed for wheezing.     sertraline 50 MG tablet  Commonly known as:  ZOLOFT  Take 50 mg by mouth daily.     tiotropium 18 MCG inhalation capsule  Commonly known as:  SPIRIVA  Place 18 mcg into inhaler and inhale 2 (two) times daily as needed (shortness of breath).     zolpidem 10 MG tablet  Commonly known as:  AMBIEN  Take 1 tablet (10 mg total) by mouth at bedtime.           Follow-up Information    Follow up with Izora Gala, MD. Schedule an appointment as soon as possible for a visit in 1 week.   Specialty:  Otolaryngology   Why:  call Nurse Ivin Booty at [phone] to make appt   Contact information:   [address] [place] [address] [place] [address] [phone]       Follow up with Mercy Leppla L, MD In 1 week.   Specialty:  Neurosurgery   Why:  For suture removal   Contact information:   [address] [place] [address] [place] [address] [phone]       Signed: Winfield Cunas 09/22/2015, 9:51 PM

## 2015-09-22 NOTE — Progress Notes (Signed)
Patient refused CPAP at first but then changed her mind. RT set up CPAP and placed on patient. Patient is resting comfortably with O2 Sat 96%.

## 2015-09-23 LAB — TYPE AND SCREEN
ABO/RH(D): A POS
ANTIBODY SCREEN: POSITIVE
DAT, IGG: NEGATIVE
DONOR AG TYPE: NEGATIVE
Donor AG Type: NEGATIVE
Donor AG Type: NEGATIVE
UNIT DIVISION: 0
Unit division: 0
Unit division: 0

## 2015-09-23 NOTE — Progress Notes (Signed)
Patient is being d/c. D/c instructions given and patient verbalized understanding. Patient left with all her belongings.

## 2015-09-23 NOTE — Progress Notes (Signed)
Patient ID: [name], female   DOB: [birth year], 60 y.o.   MRN: [id number] Subjective:  The patient is alert and pleasant. She wants to go home.  Objective: Vital signs in last 24 hours: Temp:  [97.5 F (36.4 C)-98.4 F (36.9 C)] 97.7 F (36.5 C) (03/18 0539) Pulse Rate:  [78-94] 89 (03/18 0539) Resp:  [18-20] 18 (03/18 0539) BP: (92-109)/(60-74) 99/66 mmHg (03/18 0539) SpO2:  [95 %-99 %] 98 % (03/18 0539)  Intake/Output from previous day: 03/17 0701 - 03/18 0700 In: 120 [P.O.:120] Out: -  Intake/Output this shift:    Physical exam the patient is alert and oriented 3. Her speech is normal. Her strength is normal. Her wound is healing well.  Lab Results:  Recent Labs  09/21/15 1720 09/22/15 1517  WBC 6.0 10.9*  HGB 5.9* 8.3*  HCT 21.6* 27.3*  PLT 172 PLATELET CLUMPS NOTED ON SMEAR, UNABLE TO ESTIMATE   BMET No results for input(s): NA, K, CL, CO2, GLUCOSE, BUN, CREATININE, CALCIUM in the last 72 hours.  Studies/Results: No results found.  Assessment/Plan: Status post right craniotomy: The patient is doing well. She will be discharged to home. She was given written and oral discharge instructions. I have answered all her questions.  LOS: 9 days     [name] D 09/23/2015, 10:13 AM

## 2015-09-24 DIAGNOSIS — N189 Chronic kidney disease, unspecified: Secondary | ICD-10-CM | POA: Diagnosis not present

## 2015-09-24 DIAGNOSIS — Z9181 History of falling: Secondary | ICD-10-CM | POA: Diagnosis not present

## 2015-09-24 DIAGNOSIS — K219 Gastro-esophageal reflux disease without esophagitis: Secondary | ICD-10-CM | POA: Diagnosis not present

## 2015-09-24 DIAGNOSIS — I129 Hypertensive chronic kidney disease with stage 1 through stage 4 chronic kidney disease, or unspecified chronic kidney disease: Secondary | ICD-10-CM | POA: Diagnosis not present

## 2015-09-24 DIAGNOSIS — J439 Emphysema, unspecified: Secondary | ICD-10-CM | POA: Diagnosis not present

## 2015-09-24 DIAGNOSIS — Z7951 Long term (current) use of inhaled steroids: Secondary | ICD-10-CM | POA: Diagnosis not present

## 2015-09-24 DIAGNOSIS — S065X0D Traumatic subdural hemorrhage without loss of consciousness, subsequent encounter: Secondary | ICD-10-CM | POA: Diagnosis not present

## 2015-09-24 DIAGNOSIS — Z8673 Personal history of transient ischemic attack (TIA), and cerebral infarction without residual deficits: Secondary | ICD-10-CM | POA: Diagnosis not present

## 2015-09-26 DIAGNOSIS — Z9181 History of falling: Secondary | ICD-10-CM | POA: Diagnosis not present

## 2015-09-26 DIAGNOSIS — K219 Gastro-esophageal reflux disease without esophagitis: Secondary | ICD-10-CM | POA: Diagnosis not present

## 2015-09-26 DIAGNOSIS — N189 Chronic kidney disease, unspecified: Secondary | ICD-10-CM | POA: Diagnosis not present

## 2015-09-26 DIAGNOSIS — Z8673 Personal history of transient ischemic attack (TIA), and cerebral infarction without residual deficits: Secondary | ICD-10-CM | POA: Diagnosis not present

## 2015-09-26 DIAGNOSIS — S065X0D Traumatic subdural hemorrhage without loss of consciousness, subsequent encounter: Secondary | ICD-10-CM | POA: Diagnosis not present

## 2015-09-26 DIAGNOSIS — I129 Hypertensive chronic kidney disease with stage 1 through stage 4 chronic kidney disease, or unspecified chronic kidney disease: Secondary | ICD-10-CM | POA: Diagnosis not present

## 2015-09-26 DIAGNOSIS — Z7951 Long term (current) use of inhaled steroids: Secondary | ICD-10-CM | POA: Diagnosis not present

## 2015-09-26 DIAGNOSIS — J439 Emphysema, unspecified: Secondary | ICD-10-CM | POA: Diagnosis not present

## 2015-09-27 ENCOUNTER — Ambulatory Visit: Payer: Medicare Other | Admitting: Obstetrics and Gynecology

## 2015-09-27 DIAGNOSIS — Z9181 History of falling: Secondary | ICD-10-CM | POA: Diagnosis not present

## 2015-09-27 DIAGNOSIS — S065X0D Traumatic subdural hemorrhage without loss of consciousness, subsequent encounter: Secondary | ICD-10-CM | POA: Diagnosis not present

## 2015-09-27 DIAGNOSIS — Z7951 Long term (current) use of inhaled steroids: Secondary | ICD-10-CM | POA: Diagnosis not present

## 2015-09-27 DIAGNOSIS — Z8673 Personal history of transient ischemic attack (TIA), and cerebral infarction without residual deficits: Secondary | ICD-10-CM | POA: Diagnosis not present

## 2015-09-27 DIAGNOSIS — J439 Emphysema, unspecified: Secondary | ICD-10-CM | POA: Diagnosis not present

## 2015-09-27 DIAGNOSIS — N189 Chronic kidney disease, unspecified: Secondary | ICD-10-CM | POA: Diagnosis not present

## 2015-09-27 DIAGNOSIS — I129 Hypertensive chronic kidney disease with stage 1 through stage 4 chronic kidney disease, or unspecified chronic kidney disease: Secondary | ICD-10-CM | POA: Diagnosis not present

## 2015-09-27 DIAGNOSIS — K219 Gastro-esophageal reflux disease without esophagitis: Secondary | ICD-10-CM | POA: Diagnosis not present
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
## 2015-10-03 DIAGNOSIS — F329 Major depressive disorder, single episode, unspecified: Secondary | ICD-10-CM | POA: Diagnosis not present

## 2015-10-03 IMAGING — CR DG ABDOMEN 3V
1 series · 6 of 6 positions shown · non-contrast
Comparison: CT abdomen pelvis - 09/16/2013; chest radiograph -
09/13/2013; 09/02/2013

CLINICAL DATA: Abdominal pain, constipation, weakness, history of
smoking and diabetes

EXAM:
ABDOMEN SERIES

[Series 24: w chest pa · 0.14mm/px · 6 of 6 slices shown]
[im 1/6]
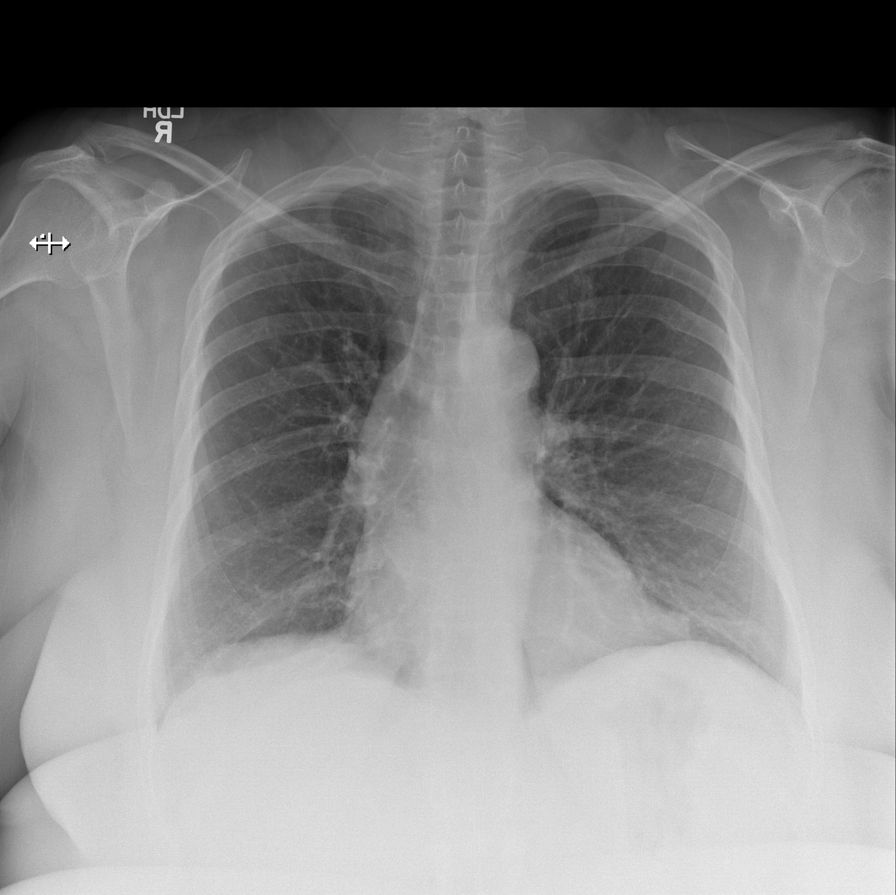
[im 2/6]
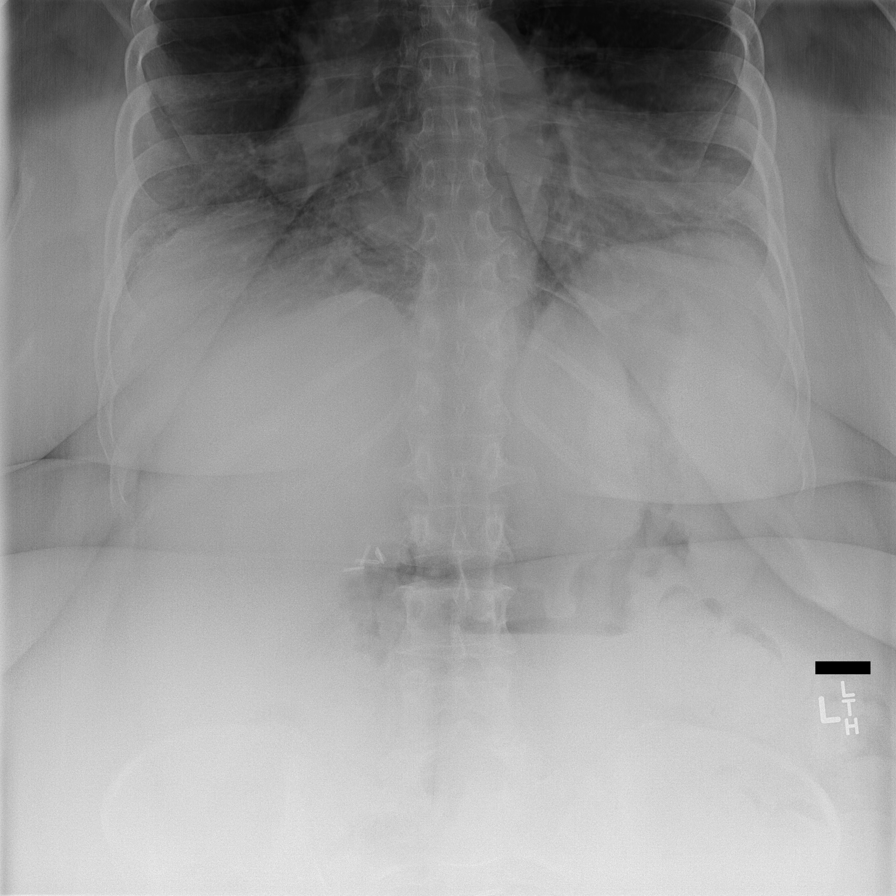
[im 3/6]
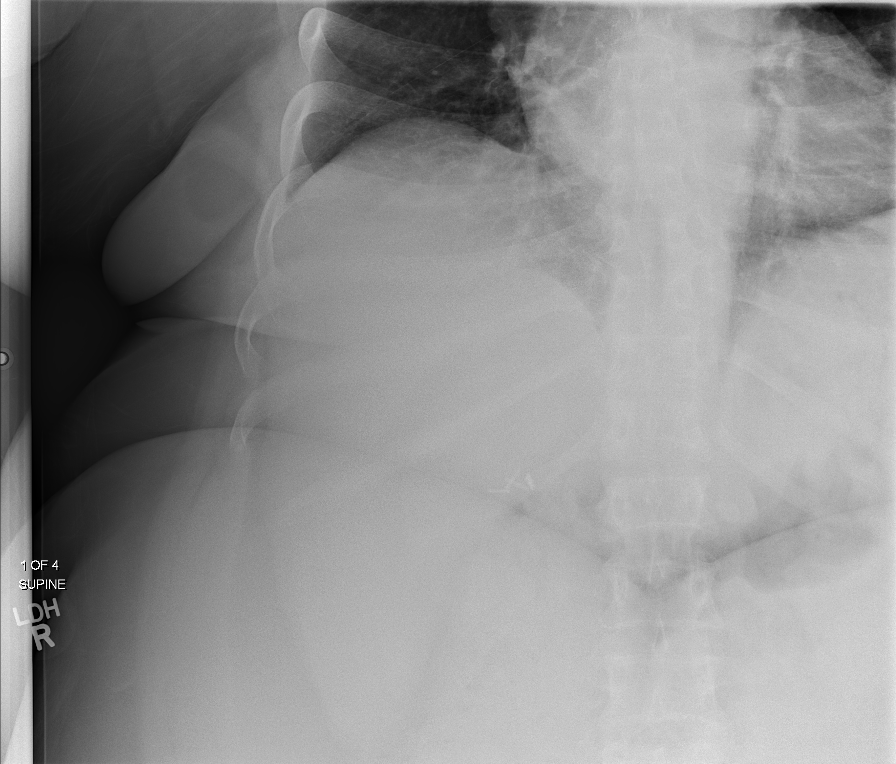
[im 4/6]
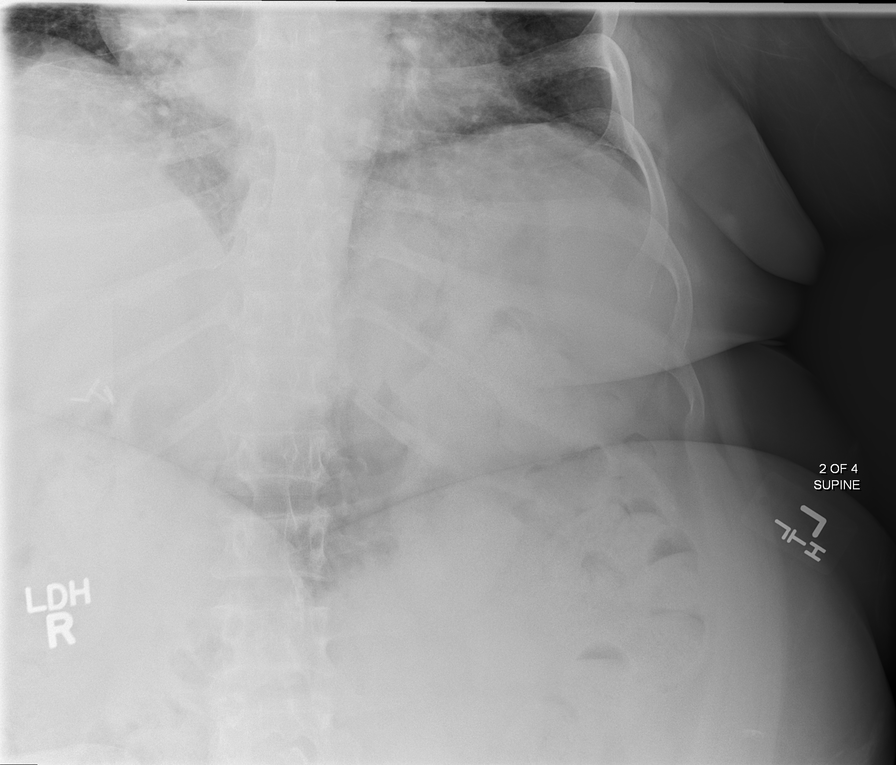
[im 5/6]
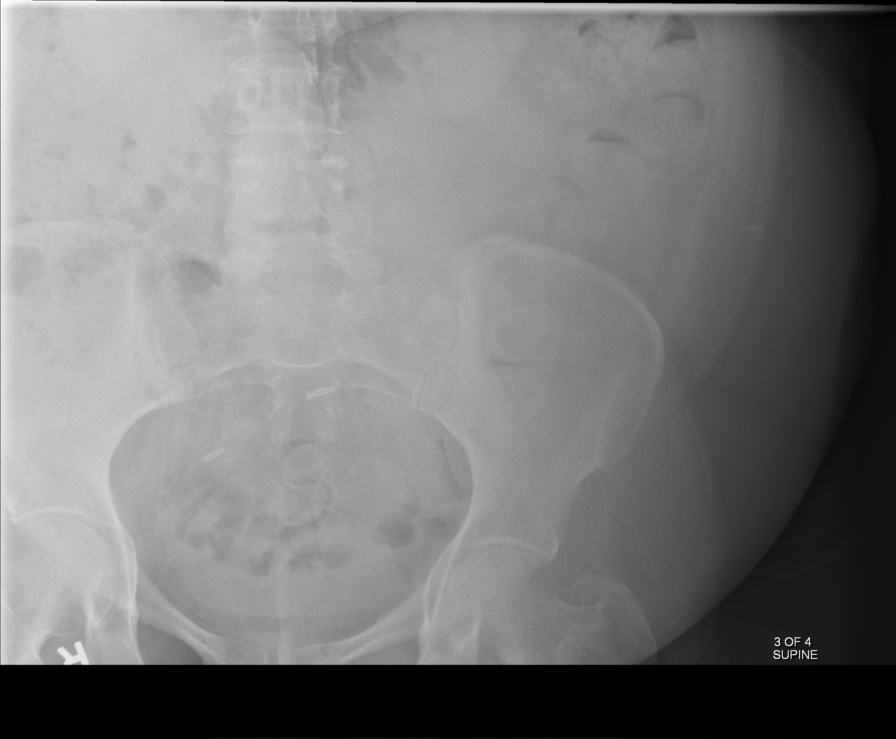
[im 6/6]
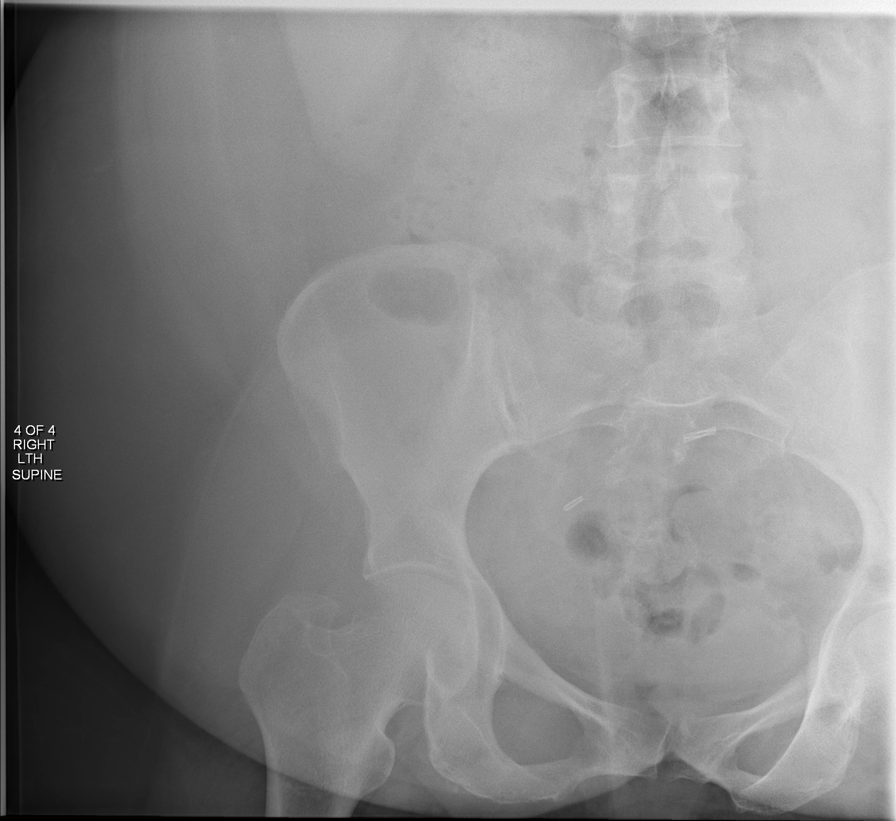

[6 of 6 positions shown; findings below may reference images not displayed]

FINDINGS: The examination is degraded due to patient body habitus.

Grossly unchanged cardiac silhouette and mediastinal contours. The
lungs appear hyperexpanded with flattening of the bilateral
diaphragms and thickening of the pulmonary interstitium. Grossly
unchanged bibasilar heterogeneous opacities, left greater than
right, likely atelectasis. No new focal airspace opacities. No
pulmonary edema.

Moderate to large colonic stool burden without evidence of
obstruction. Post cholecystectomy and bilateral tubal ligation.

Unchanged old/healed left-sided posterior lateral rib fractures.
IMPRESSION: 1. Lung hyperexpansion and bibasilar atelectasis without acute
cardiopulmonary disease.
2. Moderate to large colonic stool burden without evidence of
obstruction.

## 2015-10-05 DIAGNOSIS — Z8673 Personal history of transient ischemic attack (TIA), and cerebral infarction without residual deficits: Secondary | ICD-10-CM | POA: Diagnosis not present

## 2015-10-05 DIAGNOSIS — N189 Chronic kidney disease, unspecified: Secondary | ICD-10-CM | POA: Diagnosis not present

## 2015-10-05 DIAGNOSIS — I129 Hypertensive chronic kidney disease with stage 1 through stage 4 chronic kidney disease, or unspecified chronic kidney disease: Secondary | ICD-10-CM | POA: Diagnosis not present

## 2015-10-05 DIAGNOSIS — J439 Emphysema, unspecified: Secondary | ICD-10-CM | POA: Diagnosis not present

## 2015-10-05 DIAGNOSIS — Z9181 History of falling: Secondary | ICD-10-CM | POA: Diagnosis not present

## 2015-10-05 DIAGNOSIS — S065X0D Traumatic subdural hemorrhage without loss of consciousness, subsequent encounter: Secondary | ICD-10-CM | POA: Diagnosis not present

## 2015-10-05 DIAGNOSIS — K219 Gastro-esophageal reflux disease without esophagitis: Secondary | ICD-10-CM | POA: Diagnosis not present

## 2015-10-05 DIAGNOSIS — Z7951 Long term (current) use of inhaled steroids: Secondary | ICD-10-CM | POA: Diagnosis not present

## 2015-10-10 ENCOUNTER — Other Ambulatory Visit: Payer: Self-pay

## 2015-10-10 NOTE — Patient Outreach (Signed)
[place] [facility]) Care Management  10/10/2015  [name] 1955/10/03 [id number]   Telephone Screen  Referral Date: 10/03/15 Referral Source: [facility] high ED utilizier list Referral Reason: 4 admits, 3 ED visits in past 6 months, 18 ED visits in past 12 months PCP: PCP not in system   Outreach attempt #1 to patient. Spoke with patient. She states that she is followed by Dr. Harrington Challenger with [place] as her primary care provider. Advised patient that unfortunately she was not eligible for [facility] services due to PCP not being in network. She voiced understanding.  Plan: RN CM will notify [facility] administrative assistant of case closure.  Enzo Montgomery, RN,BSN,CCM [facility] Telephonic Care Management Coordinator Direct Phone: [phone] Toll Free: [phone] Fax: [phone]

## 2015-10-11 ENCOUNTER — Ambulatory Visit: Payer: Medicare Other | Admitting: Obstetrics and Gynecology

## 2015-10-16 ENCOUNTER — Other Ambulatory Visit: Payer: Self-pay

## 2015-10-16 DIAGNOSIS — B373 Candidiasis of vulva and vagina: Secondary | ICD-10-CM

## 2015-10-16 DIAGNOSIS — B3731 Acute candidiasis of vulva and vagina: Secondary | ICD-10-CM

## 2015-10-16 MED ORDER — TERCONAZOLE 0.4 % VA CREA: 1.0000 | TOPICAL_CREAM | Freq: Every day | VAGINAL | Status: DC

## 2015-10-16 NOTE — Telephone Encounter (Signed)
Pt calls and states she believes she has a yeast infection. Requests RX, RX sent.

## 2015-10-17 ENCOUNTER — Telehealth: Payer: Self-pay | Admitting: Obstetrics and Gynecology

## 2015-10-17 NOTE — Telephone Encounter (Signed)
Contacted patient who contacted nurse advise line yesterday stating that she felt as if she were dying.  Patient reports fever of 103, notes low BP. States that she is having to return to [facility] so that she can have a shunt placed in her head because the fluid has returned.  Is worried that she may not be around much longer. Attempted to console patient, also asked if she were seeing a Psychiatrist, which she states she is, who is teaching her ways to help cope with dying.  Advised patient to also seek pastoral/chaplain care who can offer a different and often more uplifting perspective.  Advised patient to keep appointments as scheduled and that we will continue to check in on her.

## 2015-10-20 ENCOUNTER — Emergency Department
Admission: EM | Admit: 2015-10-20 | Discharge: 2015-10-20 | Disposition: A | Discharge status: Home / Self Care | Payer: Medicare Other | Source: Home / Self Care | Attending: Emergency Medicine | Admitting: Emergency Medicine

## 2015-10-20 ENCOUNTER — Emergency Department: Payer: Medicare Other

## 2015-10-20 ENCOUNTER — Encounter: Payer: Self-pay | Admitting: Emergency Medicine

## 2015-10-20 DIAGNOSIS — L03311 Cellulitis of abdominal wall: Secondary | ICD-10-CM

## 2015-10-20 DIAGNOSIS — I129 Hypertensive chronic kidney disease with stage 1 through stage 4 chronic kidney disease, or unspecified chronic kidney disease: Secondary | ICD-10-CM

## 2015-10-20 DIAGNOSIS — R509 Fever, unspecified: Secondary | ICD-10-CM

## 2015-10-20 DIAGNOSIS — F17210 Nicotine dependence, cigarettes, uncomplicated: Secondary | ICD-10-CM | POA: Insufficient documentation

## 2015-10-20 DIAGNOSIS — N189 Chronic kidney disease, unspecified: Secondary | ICD-10-CM

## 2015-10-20 DIAGNOSIS — N3000 Acute cystitis without hematuria: Secondary | ICD-10-CM

## 2015-10-20 DIAGNOSIS — F209 Schizophrenia, unspecified: Secondary | ICD-10-CM

## 2015-10-20 DIAGNOSIS — N390 Urinary tract infection, site not specified: Secondary | ICD-10-CM | POA: Diagnosis not present

## 2015-10-20 DIAGNOSIS — G40909 Epilepsy, unspecified, not intractable, without status epilepticus: Secondary | ICD-10-CM

## 2015-10-20 DIAGNOSIS — Z8673 Personal history of transient ischemic attack (TIA), and cerebral infarction without residual deficits: Secondary | ICD-10-CM

## 2015-10-20 DIAGNOSIS — E119 Type 2 diabetes mellitus without complications: Secondary | ICD-10-CM | POA: Insufficient documentation

## 2015-10-20 DIAGNOSIS — A419 Sepsis, unspecified organism: Secondary | ICD-10-CM | POA: Diagnosis not present

## 2015-10-20 LAB — COMPREHENSIVE METABOLIC PANEL
ALT: 22 U/L (ref 14–54)
ANION GAP: 8 (ref 5–15)
AST: 56 U/L — ABNORMAL HIGH (ref 15–41)
Albumin: 3.2 g/dL — ABNORMAL LOW (ref 3.5–5.0)
Alkaline Phosphatase: 223 U/L — ABNORMAL HIGH (ref 38–126)
BUN: <5 mg/dL — ABNORMAL LOW (ref 6–20)
CHLORIDE: 104 mmol/L (ref 101–111)
CO2: 20 mmol/L — ABNORMAL LOW (ref 22–32)
CREATININE: 0.68 mg/dL (ref 0.44–1.00)
Calcium: 8.5 mg/dL — ABNORMAL LOW (ref 8.9–10.3)
Glucose, Bld: 86 mg/dL (ref 65–99)
POTASSIUM: 3.8 mmol/L (ref 3.5–5.1)
Sodium: 132 mmol/L — ABNORMAL LOW (ref 135–145)
Total Bilirubin: 1.8 mg/dL — ABNORMAL HIGH (ref 0.3–1.2)
Total Protein: 8.2 g/dL — ABNORMAL HIGH (ref 6.5–8.1)

## 2015-10-20 LAB — CBC WITH DIFFERENTIAL/PLATELET
BASOS ABS: 0.1 10*3/uL (ref 0–0.1)
BASOS PCT: 1 %
Eosinophils Absolute: 0 10*3/uL (ref 0–0.7)
Eosinophils Relative: 0 %
HEMATOCRIT: 29.7 % — AB (ref 35.0–47.0)
Hemoglobin: 9.2 g/dL — ABNORMAL LOW (ref 12.0–16.0)
Lymphocytes Relative: 17 %
Lymphs Abs: 1.8 10*3/uL (ref 1.0–3.6)
MCH: 22.5 pg — ABNORMAL LOW (ref 26.0–34.0)
MCHC: 31 g/dL — ABNORMAL LOW (ref 32.0–36.0)
MCV: 72.5 fL — ABNORMAL LOW (ref 80.0–100.0)
MONO ABS: 0.9 10*3/uL (ref 0.2–0.9)
Monocytes Relative: 9 %
NEUTROS ABS: 7.3 10*3/uL — AB (ref 1.4–6.5)
Neutrophils Relative %: 73 %
PLATELETS: 188 10*3/uL (ref 150–440)
RBC: 4.1 MIL/uL (ref 3.80–5.20)
RDW: 23 % — AB (ref 11.5–14.5)
WBC: 10.1 10*3/uL (ref 3.6–11.0)

## 2015-10-20 LAB — URINALYSIS COMPLETE WITH MICROSCOPIC (ARMC ONLY)
Bilirubin Urine: NEGATIVE
Glucose, UA: NEGATIVE mg/dL
Ketones, ur: NEGATIVE mg/dL
Nitrite: POSITIVE — AB
PH: 5 (ref 5.0–8.0)
Protein, ur: NEGATIVE mg/dL
Specific Gravity, Urine: 1.015 (ref 1.005–1.030)

## 2015-10-20 LAB — APTT: aPTT: 32 seconds (ref 24–36)

## 2015-10-20 LAB — EXPECTORATED SPUTUM ASSESSMENT W GRAM STAIN, RFLX TO RESP C

## 2015-10-20 LAB — PROTIME-INR
INR: 1.54
PROTHROMBIN TIME: 18.5 s — AB (ref 11.4–15.0)

## 2015-10-20 LAB — LACTIC ACID, PLASMA: LACTIC ACID, VENOUS: 1.1 mmol/L (ref 0.5–2.0)

## 2015-10-20 LAB — LIPASE, BLOOD: LIPASE: 23 U/L (ref 11–51)

## 2015-10-20 LAB — EXPECTORATED SPUTUM ASSESSMENT W REFEX TO RESP CULTURE

## 2015-10-20 LAB — TROPONIN I

## 2015-10-20 MED ORDER — DIATRIZOATE MEGLUMINE & SODIUM 66-10 % PO SOLN
15.0000 mL | Freq: Once | ORAL | Status: AC
  Administered 2015-10-20: 15 mL via ORAL
  Filled 2015-10-20: qty 30

## 2015-10-20 MED ORDER — SODIUM CHLORIDE 0.9 % IV BOLUS (SEPSIS)
1000.0000 mL | INTRAVENOUS | Status: AC
  Administered 2015-10-20 (×4): 1000 mL via INTRAVENOUS

## 2015-10-20 MED ORDER — ACETAMINOPHEN 500 MG PO TABS
1000.0000 mg | ORAL_TABLET | Freq: Once | ORAL | Status: AC
  Administered 2015-10-20: 1000 mg via ORAL
  Filled 2015-10-20: qty 2

## 2015-10-20 MED ORDER — SODIUM CHLORIDE 0.9 % IV SOLN
1.0000 g | Freq: Three times a day (TID) | INTRAVENOUS | Status: DC
  Filled 2015-10-20 (×2): qty 1

## 2015-10-20 MED ORDER — SODIUM CHLORIDE 0.9 % IV SOLN
1.0000 g | Freq: Once | INTRAVENOUS | Status: AC
  Administered 2015-10-20: 1 g via INTRAVENOUS
  Filled 2015-10-20: qty 1

## 2015-10-20 MED ORDER — CEPHALEXIN 500 MG PO CAPS: 500.0000 mg | ORAL_CAPSULE | Freq: Three times a day (TID) | ORAL | Status: DC

## 2015-10-20 MED ORDER — IOPAMIDOL (ISOVUE-300) INJECTION 61%
100.0000 mL | Freq: Once | INTRAVENOUS | Status: AC | PRN
  Administered 2015-10-20: 100 mL via INTRAVENOUS

## 2015-10-20 MED ORDER — CEPHALEXIN 500 MG PO CAPS
500.0000 mg | ORAL_CAPSULE | Freq: Once | ORAL | Status: AC
  Administered 2015-10-20: 500 mg via ORAL
  Filled 2015-10-20: qty 1

## 2015-10-20 NOTE — Discharge Instructions (Signed)
Cellulitis Cellulitis is an infection of the skin and the tissue under the skin. The infected area is usually red and tender. This happens most often in the arms and lower legs. HOME CARE   Take your antibiotic medicine as told. Finish the medicine even if you start to feel better.  Keep the infected arm or leg raised (elevated).  Put a warm cloth on the area up to 4 times per day.  Only take medicines as told by your doctor.  Keep all doctor visits as told. GET HELP IF:  You see red streaks on the skin coming from the infected area.  Your red area gets bigger or turns a dark color.  Your bone or joint under the infected area is painful after the skin heals.  Your infection comes back in the same area or different area.  You have a puffy (swollen) bump in the infected area.  You have new symptoms.  You have a fever. GET HELP RIGHT AWAY IF:   You feel very sleepy.  You throw up (vomit) or have watery poop (diarrhea).  You feel sick and have muscle aches and pains.   This information is not intended to replace advice given to you by your health care provider. Make sure you discuss any questions you have with your health care provider.   Document Released: 12/11/2007 Document Revised: 03/15/2015 Document Reviewed: 09/09/2011 Elsevier Interactive Patient Education 2016 [place].  Fever, Adult A fever is an increase in the body's temperature. It is often defined as a temperature of 100 F (38C) or higher. Short mild or moderate fevers often have no long-term effects. They also often do not need treatment. Moderate or high fevers may make you feel uncomfortable. Sometimes, they can also be a sign of a serious illness or disease. The sweating that may happen with repeated fevers or fevers that last a while may also cause you to not have enough fluid in your body (dehydration). You can take your temperature with a thermometer to see if you have a fever. A measured temperature  can change with:  Age.  Time of day.  Where the thermometer is placed:  Mouth (oral).  Rectum (rectal).  Ear (tympanic).  Underarm (axillary).  Forehead (temporal). HOME CARE Pay attention to any changes in your symptoms. Take these actions to help with your condition:  Take over-the-counter and prescription medicines only as told by your doctor. Follow the dosing instructions carefully.  If you were prescribed an antibiotic medicine, take it as told by your doctor. Do not stop taking the antibiotic even if you start to feel better.  Rest as needed.  Drink enough fluid to keep your pee (urine) clear or pale yellow.  Sponge yourself or bathe with room-temperature water as needed. This helps to lower your body temperature . Do not use ice water.  Do not wear too many blankets or heavy clothes. GET HELP IF:  You throw up (vomit).  You cannot eat or drink without throwing up.  You have watery poop (diarrhea).  It hurts when you pee.  Your symptoms do not get better with treatment.  You have new symptoms.  You feel very weak. GET HELP RIGHT AWAY IF:  You are short of breath or have trouble breathing.  You are dizzy or you pass out (faint).  You feel confused.  You have signs of not having enough fluid in your body, such as:  A dry mouth.  Peeing less.  Looking pale.  You have very bad pain in your belly (abdomen).  You keep throwing up or having water poop.  You have a skin rash.  Your symptoms suddenly get worse.   This information is not intended to replace advice given to you by your health care provider. Make sure you discuss any questions you have with your health care provider.   Document Released: 04/02/2008 Document Revised: 03/15/2015 Document Reviewed: 08/18/2014 Elsevier Interactive Patient Education 2016 Elsevier Inc.  Urinary Tract Infection Urinary tract infections (UTIs) can develop anywhere along your urinary tract. Your urinary  tract is your body's drainage system for removing wastes and extra water. Your urinary tract includes two kidneys, two ureters, a bladder, and a urethra. Your kidneys are a pair of bean-shaped organs. Each kidney is about the size of your fist. They are located below your ribs, one on each side of your spine. CAUSES Infections are caused by microbes, which are microscopic organisms, including fungi, viruses, and bacteria. These organisms are so small that they can only be seen through a microscope. Bacteria are the microbes that most commonly cause UTIs. SYMPTOMS  Symptoms of UTIs may vary by age and gender of the patient and by the location of the infection. Symptoms in young women typically include a frequent and intense urge to urinate and a painful, burning feeling in the bladder or urethra during urination. Older women and men are more likely to be tired, shaky, and weak and have muscle aches and abdominal pain. A fever may mean the infection is in your kidneys. Other symptoms of a kidney infection include pain in your back or sides below the ribs, nausea, and vomiting. DIAGNOSIS To diagnose a UTI, your caregiver will ask you about your symptoms. Your caregiver will also ask you to provide a urine sample. The urine sample will be tested for bacteria and white blood cells. White blood cells are made by your body to help fight infection. TREATMENT  Typically, UTIs can be treated with medication. Because most UTIs are caused by a bacterial infection, they usually can be treated with the use of antibiotics. The choice of antibiotic and length of treatment depend on your symptoms and the type of bacteria causing your infection. HOME CARE INSTRUCTIONS  If you were prescribed antibiotics, take them exactly as your caregiver instructs you. Finish the medication even if you feel better after you have only taken some of the medication.  Drink enough water and fluids to keep your urine clear or pale  yellow.  Avoid caffeine, tea, and carbonated beverages. They tend to irritate your bladder.  Empty your bladder often. Avoid holding urine for long periods of time.  Empty your bladder before and after sexual intercourse.  After a bowel movement, women should cleanse from front to back. Use each tissue only once. SEEK MEDICAL CARE IF:   You have back pain.  You develop a fever.  Your symptoms do not begin to resolve within 3 days. SEEK IMMEDIATE MEDICAL CARE IF:   You have severe back pain or lower abdominal pain.  You develop chills.  You have nausea or vomiting.  You have continued burning or discomfort with urination. MAKE SURE YOU:   Understand these instructions.  Will watch your condition.  Will get help right away if you are not doing well or get worse.   This information is not intended to replace advice given to you by your health care provider. Make sure you discuss any questions you have with your health  care provider.   Document Released: 04/03/2005 Document Revised: 03/15/2015 Document Reviewed: 08/02/2011 Elsevier Interactive Patient Education [employer].

## 2015-10-20 NOTE — ED Provider Notes (Addendum)
[facility] Emergency Department Provider Note  ____________________________________________  Time seen: 3:30 PM  I have reviewed the triage vital signs and the nursing notes.   HISTORY  Chief Complaint Fever; Abdominal Pain; and Code Sepsis    HPI [name] is a 60 y.o. female who comes to the ED for fever 2 days P she also complains of lower abdominal pain. Has a history of UTIs. Does report some dysuria as well. No chest pain shortness of breath. No dizziness syncope. She is eating and drinking normally. She has some residual headache after a craniectomy that she had 2 weeks ago for a subdural hematoma. This is overall improving without any acute changes. No numbness tingling weakness or vision changes. No acute change in balance or coordination. No vomiting.     Past Medical History  Diagnosis Date  . Emphysema of lung ([place])   . Depression   . Diverticulitis   . Chronic bronchitis ([place])   . Malignant brain tumor ([place]) 2007  . GERD (gastroesophageal reflux disease)   . Allergy   . Hepatitis C   . Hypertension   . Chronic kidney disease   . Migraines   . Urine incontinence   . Seizures ([place])   . Stroke [facility]) 2007    during brain surgery  . Pancreatitis, alcoholic 0000000  . Rectovaginal fistula   . H/O ETOH abuse     Sober since 2009  . H/O drug abuse     Clean since 2009  . Pancreatic ascites   . Cirrhosis ([place])   . Diabetes mellitus without complication [facility])      Patient Active Problem List   Diagnosis Date Noted  . Chest pain on breathing   . SOB (shortness of breath)   . Wheezing   . Subdural hematoma ([place]) 08/16/2015  . Sepsis ([place]) 06/22/2015  . Orthostatic hypotension 05/15/2015  . ALC (alcoholic liver cirrhosis) ([place]) 12/23/2014  . Clinical depression 12/23/2014  . Anxiety 12/23/2014  . Diabetes mellitus, type 2 ([place]) 12/23/2014  . HTN (hypertension) 11/05/2014  . Liver cirrhosis ([place]) 07/19/2014  . Rectovaginal  fistula 12/30/2012  . Chronic pancreatitis ([place]) 11/03/2012  . Seizure disorder ([place]) 11/03/2012  . Benign meningioma of brain ([place]) 11/03/2012  . Chronic pelvic pain in female 11/03/2012  . Hepatitis C virus infection without hepatic coma 11/03/2012  . UTI (urinary tract infection) 11/03/2012  . Alcohol abuse, unspecified 11/03/2012  . Chronic liver disease 11/03/2012  . Schizophrenia ([place]) 11/03/2012  . Narcotic abuse 11/03/2012  . 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 Breast screening 11/03/2012  . 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 Nondependent alcohol abuse 11/03/2012  . 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 Female genital symptoms 11/03/2012     Past Surgical History  Procedure Laterality Date  . Appendectomy  1999  . Eye surgery    . Rectovaginal fistula repair w/ colostomy  2011  . Brain surgery  2007    malignant brain tumor  . Cholecystectomy  2001  . Abdominal hysterectomy  1979  . Craniotomy Right 08/16/2015    Procedure: Right Fronto-Temporal-Parietal Craniotomy for Evacuation of Hematoma with Bone Flap Placement in Abdomen;  Surgeon: Ashok Pall, MD;  Location: [place] NEURO ORS;  Service: Neurosurgery;  Laterality: Right;  . Cranioplasty N/A 09/15/2015    Procedure: Cranioplasty with retrieval of bone flap from abdominal pocket;  Surgeon: Ashok Pall, MD;  Location: [facility]UR[facility] ORS;  Service: Neurosurgery;  Laterality: N/A;  Cranioplasty with retrieval of bone flap from abdominal pocket     Current Outpatient Rx  Name  Route  Sig  Dispense  Refill  . ALPRAZolam (XANAX) 1 MG tablet   Oral   Take 1 tablet (1 mg total) by mouth 3 (three) times daily as needed for anxiety.   90 tablet   0   . amitriptyline (ELAVIL) 50 MG tablet   Oral   Take 1 tablet (50 mg total) by mouth at bedtime.   30 tablet   3   . Fluticasone-Salmeterol (ADVAIR) 500-50 MCG/DOSE AEPB   Inhalation   Inhale 1 puff  into the lungs 2 (two) times daily.         . furosemide (LASIX) 20 MG tablet   Oral   Take 20 mg by mouth daily.         Marland Kitchen gabapentin (NEURONTIN) 300 MG capsule   Oral   Take 300 mg by mouth 3 (three) times daily.          . hydrocortisone cream 0.5 %   Topical   Apply 1 application topically 2 (two) times daily as needed for itching.   30 g   0   . Ipratropium-Albuterol (COMBIVENT) 20-100 MCG/ACT AERS respimat   Inhalation   Inhale 2 puffs into the lungs every 6 (six) hours as needed for wheezing or shortness of breath.          . levETIRAcetam (KEPPRA) 500 MG tablet   Oral   Take 1 tablet (500 mg total) by mouth 2 (two) times daily.   60 tablet   3   . metoprolol succinate (TOPROL-XL) 25 MG 24 hr tablet   Oral   Take 1 tablet (25 mg total) by mouth daily.   30 tablet   4   . omeprazole (PRILOSEC) 20 MG capsule   Oral   Take 1 capsule (20 mg total) by mouth daily.   30 capsule   11     Please stop all of the other PPIs if on profile   . Oxycodone HCl 20 MG TABS   Oral   Take 20 mg by mouth every 4 (four) hours as needed (for pain).         . Pancrelipase, Lip-Prot-Amyl, (CREON) 24000 units CPEP   Oral   Take 24,000 Units by mouth 3 (three) times daily with meals.         . pravastatin (PRAVACHOL) 10 MG tablet   Oral   Take 10 mg by mouth daily.          . promethazine (PHENERGAN) 25 MG tablet   Oral   Take 25 mg by mouth every 8 (eight) hours as needed for nausea or vomiting.         . sertraline (ZOLOFT) 50 MG tablet   Oral   Take 50 mg by mouth daily.          Marland Kitchen spironolactone (ALDACTONE) 50 MG tablet   Oral   Take 50 mg by mouth 2 (two) times daily.         Marland Kitchen terconazole (TERAZOL 7) 0.4 % vaginal cream   Vaginal   Place 1 applicator vaginally at bedtime.   45 g   0   . tiotropium (SPIRIVA) 18 MCG inhalation capsule   Inhalation   Place 18 mcg into inhaler and inhale daily.          Marland Kitchen triamcinolone (NASACORT ALLERGY  24HR) 55 MCG/ACT AERO  nasal inhaler   Nasal   Place 2 sprays into the nose daily as needed (for rhinitis).         Marland Kitchen zolpidem (AMBIEN) 10 MG tablet   Oral   Take 1 tablet (10 mg total) by mouth at bedtime.   30 tablet   3   . clindamycin (CLEOCIN) 300 MG capsule   Oral   Take 1 capsule (300 mg total) by mouth every 8 (eight) hours. Patient not taking: Reported on 10/20/2015   30 capsule   0   . oxyCODONE (OXY IR/ROXICODONE) 5 MG immediate release tablet   Oral   Take 1 tablet (5 mg total) by mouth every 6 (six) hours as needed for moderate pain. Patient not taking: Reported on 10/20/2015   60 tablet   0   . oxyCODONE (OXYCONTIN) 20 mg 12 hr tablet   Oral   Take 1 tablet (20 mg total) by mouth every 12 (twelve) hours. Patient not taking: Reported on 10/20/2015   60 tablet   0      Allergies Ibuprofen; Sulfa antibiotics; Amoxicillin; Chocolate; Ciprofloxacin; Dilaudid; Fentanyl; Hydromorphone; Metformin; Penicillins; Strawberry extract; and Zofran   Family History  Problem Relation Age of Onset  . Hypertension Mother   . Heart disease Father   . Diabetes Father   . Cancer Sister     brain  . Cancer Grandchild 8    brain tumor    Social History Social History  Substance Use Topics  . Smoking status: Current Some Day Smoker -- 0.25 packs/day    Types: Cigarettes  . Smokeless tobacco: Never Used     Comment: cutting back  . Alcohol Use: No     Comment: no alcohol since 2013    Review of Systems  Constitutional:   Positive fever and chills Eyes:   No vision changes.  ENT:   No sore throat. No rhinorrhea. Cardiovascular:   No chest pain. Respiratory:   No dyspnea or cough. Gastrointestinal:   Positive lower abdominal pain. No vomiting. Positive diarrhea..  No bloody stool. Genitourinary:   Positive dysuria. Musculoskeletal:   Negative for focal pain or swelling Neurological:   Negative for headaches 10-point ROS otherwise  negative.  ____________________________________________   PHYSICAL EXAM:  VITAL SIGNS: ED Triage Vitals  Enc Vitals Group     BP 10/20/15 1537 108/75 mmHg     Pulse Rate 10/20/15 1537 105     Resp 10/20/15 1600 17     Temp 10/20/15 1534 101.9 F (38.8 C)     Temp Source 10/20/15 1534 Oral     SpO2 10/20/15 1600 97 %     Weight 10/20/15 1534 262 lb (118.842 kg)     Height 10/20/15 1534 5\' 4"  (1.626 m)     Head Cir --      Peak Flow --      Pain Score 10/20/15 1538 8     Pain Loc --      Pain Edu? --      Excl. in [place]? --     Vital signs reviewed, nursing assessments reviewed.   Constitutional:   Alert and oriented. Well appearing and in no distress. Eyes:   No scleral icterus. No conjunctival pallor. PERRL. EOMI ENT   Head:   Normocephalic and atraumatic.   Nose:   No congestion/rhinnorhea. No septal hematoma   Mouth/Throat:   Dry mucous membranes, no pharyngeal erythema. No peritonsillar mass.    Neck:   No stridor. No SubQ  emphysema. No meningismus. Hematological/Lymphatic/Immunilogical:   No cervical lymphadenopathy. Cardiovascular:   Tachycardia heart rate 105. Symmetric bilateral radial and DP pulses.  No murmurs.  Respiratory:   Normal respiratory effort without tachypnea nor retractions. Breath sounds are clear and equal bilaterally. No wheezes/rales/rhonchi. Gastrointestinal:   Soft with suprapubic tenderness. Non distended. There is no CVA tenderness.  No rebound, rigidity, or guarding. Multiple abdominal wall scars Genitourinary:   deferred Musculoskeletal:   Nontender with normal range of motion in all extremities. No joint effusions.  No lower extremity tenderness.  No edema. Neurologic:   Normal speech and language.  CN 2-10 normal. Motor grossly intact. No gross focal neurologic deficits are appreciated.  Skin:    Skin is warm, dry and intact. There is approximately 3 x 5 cm area on the right mid abdominal wall of induration and mild tenderness  consistent with a cellulitis. No fluctuance. No drainage. ____________________________________________    LABS (pertinent positives/negatives) (all labs ordered are listed, but only abnormal results are displayed) Labs Reviewed  COMPREHENSIVE METABOLIC PANEL - Abnormal; Notable for the following:    Sodium 132 (*)    CO2 20 (*)    BUN <5 (*)    Calcium 8.5 (*)    Total Protein 8.2 (*)    Albumin 3.2 (*)    AST 56 (*)    Alkaline Phosphatase 223 (*)    Total Bilirubin 1.8 (*)    All other components within normal limits  CBC WITH DIFFERENTIAL/PLATELET - Abnormal; Notable for the following:    Hemoglobin 9.2 (*)    HCT 29.7 (*)    MCV 72.5 (*)    MCH 22.5 (*)    MCHC 31.0 (*)    RDW 23.0 (*)    Neutro Abs 7.3 (*)    All other components within normal limits  PROTIME-INR - Abnormal; Notable for the following:    Prothrombin Time 18.5 (*)    All other components within normal limits  URINALYSIS COMPLETEWITH MICROSCOPIC ([facility] ONLY) - Abnormal; Notable for the following:    Color, Urine AMBER (*)    APPearance CLOUDY (*)    Hgb urine dipstick 2+ (*)    Nitrite POSITIVE (*)    Leukocytes, UA 2+ (*)    Bacteria, UA RARE (*)    Squamous Epithelial / LPF 0-5 (*)    All other components within normal limits  CULTURE, EXPECTORATED SPUTUM-ASSESSMENT  CULTURE, BLOOD (ROUTINE X 2)  CULTURE, BLOOD (ROUTINE X 2)  URINE CULTURE  CULTURE, RESPIRATORY (NON-EXPECTORATED)  LACTIC ACID, PLASMA  LIPASE, BLOOD  TROPONIN I  APTT   ____________________________________________   EKG  Interpreted by me Sinus tachycardia rate 106, normal axis and intervals. Normal QRS and ST segments. Isolated T-wave inversion in V3 which is nonspecific.  ____________________________________________    RADIOLOGY  Chest x-ray unremarkable CT head unremarkable CT chest with IV contrast unremarkable, only shows some atelectasis CT abdomen and pelvis with IV contrast consistent with right anterior  abdominal wall cellulitis.  ____________________________________________   PROCEDURES   ____________________________________________   INITIAL IMPRESSION / ASSESSMENT AND PLAN / ED COURSE  Pertinent labs & imaging results that were available during my care of the patient were reviewed by me and considered in my medical decision making (see chart for details).  Patient presents with fever tachycardia and urinary symptoms. [place] is so with urinary tract infection and she does have a history of the same. However, due to her previous history of sepsis and recent subdural hematoma evacuation,  we proceeded with a sepsis workup on her including empiric vancomycin and Zosyn and IV fluid boluses. Additionally, we obtain CT imaging of the head chest abdomen and pelvis. This was all essentially unremarkable except for the abdominal wall cellulitis. Low suspicion for meningitis or encephalitis even despite the recent surgery and violation of the blood brain barrier. Vital signs after hydration have been normalized and the patient is calm and comfortable and eating a sandwich and chips. No distress, nontoxic. I'll continue the patient on Keflex which should manage all her infectious sources. Previous urine cultures have been sensitive to almost all antibiotics including multiple generations of cephalosporins.  Patient states that she is reluctant to be discharged. When I ask what her concerns are she states that "I just need a break from home". She does not seem to have any significant concerns about the state of her health at this time and she appears medically stable. We'll have her follow-up with her primary care doctor regarding the diarrhea. No low suspicion for C. difficile at this point. No diarrhea in the ED, even after eating.  C. difficile negative from 10/18/2015 at primary care   ____________________________________________   FINAL CLINICAL IMPRESSION(S) / ED DIAGNOSES  Final diagnoses:   Abdominal wall cellulitis  Acute cystitis without hematuria  Other specified fever       Portions of this note were generated with dragon dictation software. Dictation errors may occur despite best attempts at proofreading.   Carrie Mew, MD 10/20/15 2115  Carrie Mew, MD 10/20/15 2115

## 2015-10-20 NOTE — Consult Note (Signed)
Pharmacy Antibiotic Note  [name] is a 60 y.o. female admitted on 10/20/2015 with intra-abdominal infection.  Pharmacy has been consulted for meropenem dosing.  Plan: Will initiate meropenem 1g IV q8hrs.   Of note, patient has a history of penicillin allergy, but patient tolerated first dose of meropenem well.   Height: 5\' 4"  (162.6 cm) Weight: 262 lb (118.842 kg) IBW/kg (Calculated) : 54.7  Temp (24hrs), Avg:101.9 F (38.8 C), Min:101.9 F (38.8 C), Max:101.9 F (38.8 C)   Recent Labs Lab 10/20/15 1600  WBC 10.1  CREATININE 0.68  LATICACIDVEN 1.1    Estimated Creatinine Clearance: 97.2 mL/min (by C-G formula based on Cr of 0.68).    Allergies  Allergen Reactions  . Ibuprofen Other (See Comments)    Cannot take because of liver disease   . Sulfa Antibiotics Hives  . Amoxicillin Rash  . Chocolate Rash  . Ciprofloxacin Itching  . Dilaudid [Hydromorphone Hcl] Itching  . Fentanyl Rash  . Hydromorphone Itching  . Metformin Nausea Only  . Penicillins Rash and Other (See Comments)    Has patient had a PCN reaction causing immediate rash, facial/tongue/throat swelling, SOB or lightheadedness with hypotension: [id number] Has patient had a PCN reaction causing severe rash involving mucus membranes or skin necrosis: [id number] Has patient had a PCN reaction that required hospitalization [id number] Has patient had a PCN reaction occurring within the last 10 years: [id number] If all of the above answers are "NO", then may proceed with Cephalosporin use.   . Strawberry (Diagnostic) Rash  . Strawberry Extract Rash  . Zofran [Ondansetron Hcl] Itching and Nausea And Vomiting    Do not use; vomiting per pt  . Zofran [Ondansetron] Other (See Comments)    Do not use; vomiting per pt Unlikely due to Zofran    Antimicrobials this admission: Meropenem 4/14 >>  Thank you for allowing pharmacy to be a part of this patient's care.  Vena Rua 10/20/2015  5:50 PM

## 2015-10-20 NOTE — ED Notes (Signed)
Patient brought in by [facility] from home for fever times 2 days. Patient is 2 weeks post-op from subdural hematoma. Patient is also c/o Lower abd pain

## 2015-10-21 ENCOUNTER — Encounter: Payer: Self-pay | Admitting: Emergency Medicine

## 2015-10-21 ENCOUNTER — Emergency Department: Payer: Medicare Other

## 2015-10-21 ENCOUNTER — Inpatient Hospital Stay
Admission: EM | Admit: 2015-10-21 | Discharge: 2015-10-25 | DRG: 872 | Disposition: A | Discharge status: Home / Self Care | Payer: Medicare Other | Attending: Internal Medicine | Admitting: Internal Medicine

## 2015-10-21 DIAGNOSIS — B957 Other staphylococcus as the cause of diseases classified elsewhere: Secondary | ICD-10-CM | POA: Diagnosis present

## 2015-10-21 DIAGNOSIS — R161 Splenomegaly, not elsewhere classified: Secondary | ICD-10-CM | POA: Diagnosis present

## 2015-10-21 DIAGNOSIS — B1920 Unspecified viral hepatitis C without hepatic coma: Secondary | ICD-10-CM | POA: Diagnosis present

## 2015-10-21 DIAGNOSIS — R1012 Left upper quadrant pain: Secondary | ICD-10-CM | POA: Diagnosis not present

## 2015-10-21 DIAGNOSIS — Z85841 Personal history of malignant neoplasm of brain: Secondary | ICD-10-CM

## 2015-10-21 DIAGNOSIS — R0789 Other chest pain: Secondary | ICD-10-CM

## 2015-10-21 DIAGNOSIS — Z881 Allergy status to other antibiotic agents status: Secondary | ICD-10-CM

## 2015-10-21 DIAGNOSIS — E785 Hyperlipidemia, unspecified: Secondary | ICD-10-CM | POA: Diagnosis present

## 2015-10-21 DIAGNOSIS — G40909 Epilepsy, unspecified, not intractable, without status epilepticus: Secondary | ICD-10-CM | POA: Diagnosis present

## 2015-10-21 DIAGNOSIS — K7460 Unspecified cirrhosis of liver: Secondary | ICD-10-CM | POA: Diagnosis present

## 2015-10-21 DIAGNOSIS — Z90710 Acquired absence of both cervix and uterus: Secondary | ICD-10-CM

## 2015-10-21 DIAGNOSIS — R079 Chest pain, unspecified: Secondary | ICD-10-CM

## 2015-10-21 DIAGNOSIS — B961 Klebsiella pneumoniae [K. pneumoniae] as the cause of diseases classified elsewhere: Secondary | ICD-10-CM | POA: Diagnosis present

## 2015-10-21 DIAGNOSIS — N189 Chronic kidney disease, unspecified: Secondary | ICD-10-CM | POA: Diagnosis present

## 2015-10-21 DIAGNOSIS — N390 Urinary tract infection, site not specified: Secondary | ICD-10-CM | POA: Diagnosis present

## 2015-10-21 DIAGNOSIS — A419 Sepsis, unspecified organism: Secondary | ICD-10-CM

## 2015-10-21 DIAGNOSIS — Z8673 Personal history of transient ischemic attack (TIA), and cerebral infarction without residual deficits: Secondary | ICD-10-CM

## 2015-10-21 DIAGNOSIS — Z886 Allergy status to analgesic agent status: Secondary | ICD-10-CM

## 2015-10-21 DIAGNOSIS — E876 Hypokalemia: Secondary | ICD-10-CM | POA: Diagnosis present

## 2015-10-21 DIAGNOSIS — R52 Pain, unspecified: Secondary | ICD-10-CM

## 2015-10-21 DIAGNOSIS — Z882 Allergy status to sulfonamides status: Secondary | ICD-10-CM

## 2015-10-21 DIAGNOSIS — F17210 Nicotine dependence, cigarettes, uncomplicated: Secondary | ICD-10-CM | POA: Diagnosis present

## 2015-10-21 DIAGNOSIS — I129 Hypertensive chronic kidney disease with stage 1 through stage 4 chronic kidney disease, or unspecified chronic kidney disease: Secondary | ICD-10-CM | POA: Diagnosis present

## 2015-10-21 DIAGNOSIS — E1122 Type 2 diabetes mellitus with diabetic chronic kidney disease: Secondary | ICD-10-CM | POA: Diagnosis present

## 2015-10-21 DIAGNOSIS — Z888 Allergy status to other drugs, medicaments and biological substances status: Secondary | ICD-10-CM

## 2015-10-21 DIAGNOSIS — K219 Gastro-esophageal reflux disease without esophagitis: Secondary | ICD-10-CM | POA: Diagnosis present

## 2015-10-21 DIAGNOSIS — Z7951 Long term (current) use of inhaled steroids: Secondary | ICD-10-CM

## 2015-10-21 DIAGNOSIS — E871 Hypo-osmolality and hyponatremia: Secondary | ICD-10-CM | POA: Diagnosis present

## 2015-10-21 DIAGNOSIS — K860 Alcohol-induced chronic pancreatitis: Secondary | ICD-10-CM | POA: Diagnosis present

## 2015-10-21 DIAGNOSIS — F329 Major depressive disorder, single episode, unspecified: Secondary | ICD-10-CM | POA: Diagnosis present

## 2015-10-21 DIAGNOSIS — Z880 Allergy status to penicillin: Secondary | ICD-10-CM

## 2015-10-21 DIAGNOSIS — R0781 Pleurodynia: Secondary | ICD-10-CM | POA: Diagnosis not present

## 2015-10-21 DIAGNOSIS — R0602 Shortness of breath: Secondary | ICD-10-CM

## 2015-10-21 LAB — COMPREHENSIVE METABOLIC PANEL
ALK PHOS: 207 U/L — AB (ref 38–126)
ALT: 22 U/L (ref 14–54)
AST: 67 U/L — AB (ref 15–41)
Albumin: 3.1 g/dL — ABNORMAL LOW (ref 3.5–5.0)
Anion gap: 7 (ref 5–15)
BILIRUBIN TOTAL: 1.4 mg/dL — AB (ref 0.3–1.2)
BUN: 5 mg/dL — AB (ref 6–20)
CALCIUM: 8.4 mg/dL — AB (ref 8.9–10.3)
CO2: 20 mmol/L — ABNORMAL LOW (ref 22–32)
Chloride: 103 mmol/L (ref 101–111)
Creatinine, Ser: 0.76 mg/dL (ref 0.44–1.00)
GFR calc Af Amer: >60 mL/min (ref >60)
Glucose, Bld: 91 mg/dL (ref 65–99)
POTASSIUM: 3.1 mmol/L — AB (ref 3.5–5.1)
Sodium: 130 mmol/L — ABNORMAL LOW (ref 135–145)
TOTAL PROTEIN: 8 g/dL (ref 6.5–8.1)

## 2015-10-21 LAB — BLOOD CULTURE ID PANEL (REFLEXED)
ACINETOBACTER BAUMANNII: NOT DETECTED
CARBAPENEM RESISTANCE: NOT DETECTED
Candida albicans: NOT DETECTED
Candida glabrata: NOT DETECTED
Candida krusei: NOT DETECTED
Candida parapsilosis: NOT DETECTED
Candida tropicalis: NOT DETECTED
ENTEROBACTERIACEAE SPECIES: NOT DETECTED
ENTEROCOCCUS SPECIES: NOT DETECTED
Enterobacter cloacae complex: NOT DETECTED
Escherichia coli: NOT DETECTED
Haemophilus influenzae: NOT DETECTED
Klebsiella oxytoca: NOT DETECTED
Klebsiella pneumoniae: NOT DETECTED
Listeria monocytogenes: NOT DETECTED
Methicillin resistance: DETECTED — AB
NEISSERIA MENINGITIDIS: NOT DETECTED
PSEUDOMONAS AERUGINOSA: NOT DETECTED
Proteus species: NOT DETECTED
STAPHYLOCOCCUS AUREUS BCID: NOT DETECTED
STAPHYLOCOCCUS SPECIES: DETECTED — AB
STREPTOCOCCUS AGALACTIAE: NOT DETECTED
STREPTOCOCCUS PYOGENES: NOT DETECTED
STREPTOCOCCUS SPECIES: NOT DETECTED
Serratia marcescens: NOT DETECTED
Streptococcus pneumoniae: NOT DETECTED
VANCOMYCIN RESISTANCE: NOT DETECTED

## 2015-10-21 LAB — CBC WITH DIFFERENTIAL/PLATELET
BASOS ABS: 0 10*3/uL (ref 0–0.1)
BASOS PCT: 0 %
EOS ABS: 0.1 10*3/uL (ref 0–0.7)
EOS PCT: 1 %
HCT: 27 % — ABNORMAL LOW (ref 35.0–47.0)
Hemoglobin: 8.4 g/dL — ABNORMAL LOW (ref 12.0–16.0)
LYMPHS PCT: 19 %
Lymphs Abs: 2.1 10*3/uL (ref 1.0–3.6)
MCH: 22.4 pg — ABNORMAL LOW (ref 26.0–34.0)
MCHC: 31 g/dL — ABNORMAL LOW (ref 32.0–36.0)
MCV: 72.3 fL — ABNORMAL LOW (ref 80.0–100.0)
Monocytes Absolute: 1 10*3/uL — ABNORMAL HIGH (ref 0.2–0.9)
Monocytes Relative: 8 %
Neutro Abs: 8.4 10*3/uL — ABNORMAL HIGH (ref 1.4–6.5)
Neutrophils Relative %: 72 %
Platelets: 175 10*3/uL (ref 150–440)
RBC: 3.74 MIL/uL — AB (ref 3.80–5.20)
RDW: 23.3 % — AB (ref 11.5–14.5)
WBC: 11.6 10*3/uL — AB (ref 3.6–11.0)

## 2015-10-21 LAB — LACTIC ACID, PLASMA: Lactic Acid, Venous: 1.5 mmol/L (ref 0.5–2.0)

## 2015-10-21 MED ORDER — DEXTROSE 5 % IV SOLN
1.0000 g | Freq: Three times a day (TID) | INTRAVENOUS | Status: DC
  Administered 2015-10-22: 1 g via INTRAVENOUS
  Filled 2015-10-21 (×2): qty 1

## 2015-10-21 MED ORDER — VANCOMYCIN HCL IN DEXTROSE 1-5 GM/200ML-% IV SOLN
1000.0000 mg | Freq: Once | INTRAVENOUS | Status: AC
  Administered 2015-10-22: 1000 mg via INTRAVENOUS
  Filled 2015-10-21: qty 200

## 2015-10-21 MED ORDER — DEXTROSE 5 % IV SOLN
1.0000 g | Freq: Once | INTRAVENOUS | Status: AC
  Administered 2015-10-21: 1 g via INTRAVENOUS
  Filled 2015-10-21: qty 10

## 2015-10-21 MED ORDER — SODIUM CHLORIDE 0.9 % IV BOLUS (SEPSIS)
30.0000 mL/kg | Freq: Once | INTRAVENOUS | Status: AC
  Administered 2015-10-21: 3144 mL via INTRAVENOUS

## 2015-10-21 NOTE — ED Notes (Signed)
Was seen this past week - called in today for abnormal labs - states the blood culture was abnormal. Past hx of septic shock. Febrile, hypotensive, tachycardic

## 2015-10-21 NOTE — Progress Notes (Signed)
Pharmacy Antibiotic Note  [name] is a 60 y.o. female admitted on 10/21/2015 with Other - listed below.  Pharmacy has been consulted for aztreonam dosing.  Plan: Aztreonam 1 gm IV Q8H  Height: 5\' 8"  (172.7 cm) Weight: 231 lb (104.781 kg) IBW/kg (Calculated) : 63.9  Temp (24hrs), Avg:102.1 F (38.9 C), Min:102.1 F (38.9 C), Max:102.1 F (38.9 C)   Recent Labs Lab 10/20/15 1600 10/21/15 2301  WBC 10.1 11.6*  CREATININE 0.68  --   LATICACIDVEN 1.1  --     Estimated Creatinine Clearance: 97.2 mL/min (by C-G formula based on Cr of 0.68).    Allergies  Allergen Reactions  . Ibuprofen Other (See Comments)    Pt states that she is unable to take this medication because of her liver.    . Sulfa Antibiotics Hives  . Amoxicillin Itching and Rash    Has patient had a PCN reaction causing immediate rash, facial/tongue/throat swelling, SOB or lightheadedness with hypotension: No Has patient had a PCN reaction causing severe rash involving mucus membranes or skin necrosis: No Has patient had a PCN reaction that required hospitalization No Has patient had a PCN reaction occurring within the last 10 years: Yes If all of the above answers are "NO", then may proceed with Cephalosporin use.  . Chocolate Rash  . Ciprofloxacin Itching and Rash  . Dilaudid [Hydromorphone Hcl] Itching  . Fentanyl Rash  . Hydromorphone Itching  . Metformin Nausea And Vomiting  . Penicillins Itching, Rash and Other (See Comments)    Has patient had a PCN reaction causing immediate rash, facial/tongue/throat swelling, SOB or lightheadedness with hypotension: No Has patient had a PCN reaction causing severe rash involving mucus membranes or skin necrosis: No Has patient had a PCN reaction that required hospitalization No Has patient had a PCN reaction occurring within the last 10 years: Yes If all of the above answers are "NO", then may proceed with Cephalosporin use.  . Strawberry Extract Rash  .  Zofran [Ondansetron Hcl] Itching and Nausea And Vomiting    Thank you for allowing pharmacy to be a part of this patient's care.  Laural Benes, Pharm.D., BCPS Clinical Pharmacist 10/21/2015 11:32 PM

## 2015-10-21 NOTE — ED Notes (Signed)
ADDENDUM written on 10/21/15 @ 2013 - Patient called at home and advised to return to the ED per direction from Dr. Jacqualine Code. MD advising that patient will need to be admitted to medicine secondary to blood culture results that were (+) for MRSA. Call placed and patient advised to return. Patient states, "Oh no. I am thinking that I have sepsis again". MD made aware that contact made with patient.

## 2015-10-21 NOTE — ED Provider Notes (Signed)
[facility] Emergency Department Provider Note   ____________________________________________  Time seen: ~2135  I have reviewed the triage vital signs and the nursing notes.   HISTORY  Chief Complaint Code Sepsis   History limited by: Not Limited   HPI [name] is a 60 y.o. female who presents to the emergency department today because of positive blood culture. The patient was seen in the emergency department yesterday. She was seen because of fevers. They have now been going on for 3 days. Additionally the patient has had suprapubic pain, cloudy urine and low back pain. 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 She states that since being seen yesterday she has now also developed a cough. There has been some blood in it. The patient states she has been taking the clindamycin that was prescribed to her.   Past Medical History  Diagnosis Date  . Emphysema of lung ([place])   . Depression   . Diverticulitis   . Chronic bronchitis ([place])   . Malignant brain tumor ([place]) 2007  . GERD (gastroesophageal reflux disease)   . Allergy   . Hepatitis C   . Hypertension   . Chronic kidney disease   . Migraines   . Urine incontinence   . Seizures ([place])   . Stroke [facility]) 2007    during brain surgery  . Pancreatitis, alcoholic 0000000  . Rectovaginal fistula   . H/O ETOH abuse     Sober since 2009  . H/O drug abuse     Clean since 2009  . Pancreatic ascites   . Cirrhosis ([place])   . Diabetes mellitus without complication [facility])     Patient Active Problem List   Diagnosis Date Noted  . Chest pain on breathing   . SOB (shortness of breath)   . Wheezing   . Subdural hematoma ([place]) 08/16/2015  . Sepsis ([place]) 06/22/2015  . Orthostatic hypotension 05/15/2015  . ALC (alcoholic liver cirrhosis) ([place]) 12/23/2014  . Clinical depression 12/23/2014  . Anxiety 12/23/2014  . Diabetes mellitus, type 2 ([place]) 12/23/2014  . HTN (hypertension)  11/05/2014  . Liver cirrhosis ([place]) 07/19/2014  . 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 Chronic pancreatitis ([place]) 11/03/2012  . Seizure disorder ([place]) 11/03/2012  . Benign meningioma of brain ([place]) 11/03/2012  . Chronic pelvic pain in female 11/03/2012  . Hepatitis C virus infection without hepatic coma 11/03/2012  . UTI (urinary tract infection) 11/03/2012  . Alcohol abuse, unspecified 11/03/2012  . Chronic liver disease 11/03/2012  . Schizophrenia ([place]) 11/03/2012  . Narcotic abuse 11/03/2012  . 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 Breast screening 11/03/2012  . 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 Nondependent alcohol abuse 11/03/2012  . 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 Female genital symptoms 11/03/2012    Past Surgical History  Procedure Laterality Date  . Appendectomy  1999  . Eye surgery    . Rectovaginal fistula repair w/ colostomy  2011  . Brain surgery  2007    malignant brain tumor  . Cholecystectomy  2001  . Abdominal hysterectomy  1979  . Craniotomy Right 08/16/2015    Procedure: Right Fronto-Temporal-Parietal Craniotomy for Evacuation of Hematoma with Bone Flap Placement in Abdomen;  Surgeon: Ashok Pall, MD;  Location: [place] [facility]UR[facility] ORS;  Service: Neurosurgery;  Laterality: Right;  . Cranioplasty N/A 09/15/2015    Procedure: Cranioplasty with retrieval of bone flap from abdominal pocket;  Surgeon: Ashok Pall, MD;  Location: [place] NEUR[facility] ORS;  Service: Neurosurgery;  Laterality: N/A;  Cranioplasty with retrieval of bone flap from abdominal pocket    Current Outpatient Rx  Name  Route  Sig  Dispense  Refill  . ALPRAZolam (XANAX) 1 MG tablet   Oral   Take 1 tablet (1 mg total) by mouth 3 (three) times daily as needed for anxiety.   90 tablet   0   . amitriptyline (ELAVIL) 50 MG tablet   Oral   Take 1 tablet (50 mg total) by mouth at bedtime.   30 tablet   3   . cephALEXin (KEFLEX) 500 MG  capsule   Oral   Take 1 capsule (500 mg total) by mouth 3 (three) times daily.   21 capsule   0   . Fluticasone-Salmeterol (ADVAIR) 500-50 MCG/DOSE AEPB   Inhalation   Inhale 1 puff into the lungs 2 (two) times daily.         . furosemide (LASIX) 20 MG tablet   Oral   Take 20 mg by mouth daily.         Marland Kitchen gabapentin (NEURONTIN) 300 MG capsule   Oral   Take 300 mg by mouth 3 (three) times daily.          . hydrocortisone cream 0.5 %   Topical   Apply 1 application topically 2 (two) times daily as needed for itching.   30 g   0   . Ipratropium-Albuterol (COMBIVENT) 20-100 MCG/ACT AERS respimat   Inhalation   Inhale 2 puffs into the lungs every 6 (six) hours as needed for wheezing or shortness of breath.          . levETIRAcetam (KEPPRA) 500 MG tablet   Oral   Take 1 tablet (500 mg total) by mouth 2 (two) times daily.   60 tablet   3   . metoprolol succinate (TOPROL-XL) 25 MG 24 hr tablet   Oral   Take 1 tablet (25 mg total) by mouth daily.   30 tablet   4   . omeprazole (PRILOSEC) 20 MG capsule   Oral   Take 1 capsule (20 mg total) by mouth daily.   30 capsule   11     Please stop all of the other PPIs if on profile   . Oxycodone HCl 20 MG TABS   Oral   Take 20 mg by mouth every 4 (four) hours as needed (for pain).         . Pancrelipase, Lip-Prot-Amyl, (CREON) 24000 units CPEP   Oral   Take 24,000 Units by mouth 3 (three) times daily with meals.         . pravastatin (PRAVACHOL) 10 MG tablet   Oral   Take 10 mg by mouth daily.          . promethazine (PHENERGAN) 25 MG tablet   Oral   Take 25 mg by mouth every 8 (eight) hours as needed for nausea or vomiting.         . sertraline (ZOLOFT) 50 MG tablet   Oral   Take 50 mg by mouth daily.          Marland Kitchen spironolactone (ALDACTONE) 50 MG tablet   Oral   Take 50 mg by mouth 2 (two) times daily.         Marland Kitchen terconazole (TERAZOL 7) 0.4 % vaginal cream   Vaginal   Place 1 applicator  vaginally at bedtime.   45 g   0   .  tiotropium (SPIRIVA) 18 MCG inhalation capsule   Inhalation   Place 18 mcg into inhaler and inhale daily.          Marland Kitchen triamcinolone (NASACORT ALLERGY 24HR) 55 MCG/ACT AERO nasal inhaler   Nasal   Place 2 sprays into the nose daily as needed (for rhinitis).         Marland Kitchen zolpidem (AMBIEN) 10 MG tablet   Oral   Take 1 tablet (10 mg total) by mouth at bedtime.   30 tablet   3   . clindamycin (CLEOCIN) 300 MG capsule   Oral   Take 1 capsule (300 mg total) by mouth every 8 (eight) hours. Patient not taking: Reported on 10/20/2015   30 capsule   0   . oxyCODONE (OXY IR/ROXICODONE) 5 MG immediate release tablet   Oral   Take 1 tablet (5 mg total) by mouth every 6 (six) hours as needed for moderate pain. Patient not taking: Reported on 10/20/2015   60 tablet   0   . oxyCODONE (OXYCONTIN) 20 mg 12 hr tablet   Oral   Take 1 tablet (20 mg total) by mouth every 12 (twelve) hours. Patient not taking: Reported on 10/20/2015   60 tablet   0     Allergies Ibuprofen; Sulfa antibiotics; Amoxicillin; Chocolate; Ciprofloxacin; Dilaudid; Fentanyl; Hydromorphone; Metformin; Penicillins; Strawberry extract; and Zofran  Family History  Problem Relation Age of Onset  . Hypertension Mother   . Heart disease Father   . Diabetes Father   . Cancer Sister     brain  . Cancer Grandchild 8    brain tumor    Social History Social History  Substance Use Topics  . Smoking status: Light Tobacco Smoker -- 0.50 packs/day    Types: Cigarettes  . Smokeless tobacco: Never Used     Comment: cutting back  . Alcohol Use: 2.4 oz/week    0 Standard drinks or equivalent, 4 Cans of beer per week    Review of Systems  Constitutional: Negative for fever. Cardiovascular: Negative for chest pain. Respiratory: Negative for shortness of breath.Positive for cough Gastrointestinal: Positive suprapubic pain. Neurological: Negative for headaches, focal weakness or  numbness.  10-point ROS otherwise negative.  ____________________________________________   PHYSICAL EXAM:  VITAL SIGNS: ED Triage Vitals  Enc Vitals Group     BP 10/21/15 2240 99/60 mmHg     Pulse Rate 10/21/15 2240 108     Resp 10/21/15 2240 18     Temp 10/21/15 2240 102.1 F (38.9 C)     Temp Source 10/21/15 2240 Oral     SpO2 10/21/15 2240 96 %     Weight 10/21/15 2240 231 lb (104.781 kg)     Height 10/21/15 2240 5\' 8"  (1.727 m)     Head Cir --      Peak Flow --      Pain Score 10/21/15 2240 0   Constitutional: Alert and oriented. In no acute distress. Nontoxic. Eyes: Conjunctivae are normal. PERRL. Normal extraocular movements. ENT   Head: Normocephalic and atraumatic.   Nose: No congestion/rhinnorhea.   Mouth/Throat: Mucous membranes are moist.   Neck: No stridor. Hematological/Lymphatic/Immunilogical: No cervical lymphadenopathy. Cardiovascular: Normal rate, regular rhythm.  No murmurs, rubs, or gallops. Respiratory: Normal respiratory effort without tachypnea nor retractions. Breath sounds are clear and equal bilaterally. No wheezes/rales/rhonchi. Gastrointestinal: Soft and mildly tender to palpation in the suprapubic region. Incision site in the right abdomen appears to be well-healing. No distention.  Genitourinary: Deferred Musculoskeletal: Normal  range of motion in all extremities. No joint effusions.  No lower extremity tenderness nor edema. Neurologic:  Normal speech and language. No gross focal neurologic deficits are appreciated.  Skin:  Skin is warm, dry and intact. No rash noted. Psychiatric: Mood and affect are normal. Speech and behavior are normal. Patient exhibits appropriate insight and judgment.  ____________________________________________    LABS (pertinent positives/negatives)  WBC 11.6 Hgb 8.4 Plt 175 Lactic acid 1.5 Na 130 K 3.1 Cr 0.76 ____________________________________________   EKG  I, Nance Pear, attending  physician, personally viewed and interpreted this EKG  EKG Time: 2334 Rate: 99 Rhythm: normal sinus rhythm Axis: normal Intervals: qtc 470 QRS: narrow ST changes: no st elevation Impression: normal ekg ____________________________________________    RADIOLOGY  CXR  IMPRESSION: Linear opacities in the right base. These may be atelectatic. No confluent consolidation. No effusions.  ____________________________________________   PROCEDURES  Procedure(s) performed: None  Critical Care performed: No  ____________________________________________   INITIAL IMPRESSION / ASSESSMENT AND PLAN / ED COURSE  Pertinent labs & imaging results that were available during my care of the patient were reviewed by me and considered in my medical decision making (see chart for details).  Patient presented to the emergency department today because of concerns for blood work which showed positive blood culture. Patient states that she has had continued weakness, lower abdominal pain and back pain. On exam patient is awake and alert and mildly uncomfortable. Patient has mild suprapubic tenderness. Patient however was febrile and tachycardic and likely relatively hypotensive. Because of this he was called a code sepsis. She was given multiple broad-spectrum antibiotics and IV fluids. Patient's tachycardia did improve. The patient will be admitted to the hospitalist service for further work up and management.   ____________________________________________   FINAL CLINICAL IMPRESSION(S) / ED DIAGNOSES  Final diagnoses:  Sepsis, due to unspecified organism [facility])  UTI (lower urinary tract infection)     Nance Pear, MD 10/22/15 [phone]

## 2015-10-22 ENCOUNTER — Encounter: Payer: Self-pay | Admitting: Internal Medicine

## 2015-10-22 ENCOUNTER — Inpatient Hospital Stay: Payer: Medicare Other

## 2015-10-22 DIAGNOSIS — B961 Klebsiella pneumoniae [K. pneumoniae] as the cause of diseases classified elsewhere: Secondary | ICD-10-CM | POA: Diagnosis present

## 2015-10-22 DIAGNOSIS — Z888 Allergy status to other drugs, medicaments and biological substances status: Secondary | ICD-10-CM | POA: Diagnosis not present

## 2015-10-22 DIAGNOSIS — E785 Hyperlipidemia, unspecified: Secondary | ICD-10-CM | POA: Diagnosis present

## 2015-10-22 DIAGNOSIS — Z7951 Long term (current) use of inhaled steroids: Secondary | ICD-10-CM | POA: Diagnosis not present

## 2015-10-22 DIAGNOSIS — F17210 Nicotine dependence, cigarettes, uncomplicated: Secondary | ICD-10-CM | POA: Diagnosis present

## 2015-10-22 DIAGNOSIS — R161 Splenomegaly, not elsewhere classified: Secondary | ICD-10-CM | POA: Diagnosis present

## 2015-10-22 DIAGNOSIS — E871 Hypo-osmolality and hyponatremia: Secondary | ICD-10-CM | POA: Diagnosis present

## 2015-10-22 DIAGNOSIS — Z8673 Personal history of transient ischemic attack (TIA), and cerebral infarction without residual deficits: Secondary | ICD-10-CM | POA: Diagnosis not present

## 2015-10-22 DIAGNOSIS — N189 Chronic kidney disease, unspecified: Secondary | ICD-10-CM | POA: Diagnosis present

## 2015-10-22 DIAGNOSIS — Z886 Allergy status to analgesic agent status: Secondary | ICD-10-CM | POA: Diagnosis not present

## 2015-10-22 DIAGNOSIS — K219 Gastro-esophageal reflux disease without esophagitis: Secondary | ICD-10-CM | POA: Diagnosis present

## 2015-10-22 DIAGNOSIS — E1122 Type 2 diabetes mellitus with diabetic chronic kidney disease: Secondary | ICD-10-CM | POA: Diagnosis present

## 2015-10-22 DIAGNOSIS — E876 Hypokalemia: Secondary | ICD-10-CM | POA: Diagnosis present

## 2015-10-22 DIAGNOSIS — B1920 Unspecified viral hepatitis C without hepatic coma: Secondary | ICD-10-CM | POA: Diagnosis present

## 2015-10-22 DIAGNOSIS — Z881 Allergy status to other antibiotic agents status: Secondary | ICD-10-CM | POA: Diagnosis not present

## 2015-10-22 DIAGNOSIS — A419 Sepsis, unspecified organism: Secondary | ICD-10-CM | POA: Diagnosis present

## 2015-10-22 DIAGNOSIS — Z90710 Acquired absence of both cervix and uterus: Secondary | ICD-10-CM | POA: Diagnosis not present

## 2015-10-22 DIAGNOSIS — I129 Hypertensive chronic kidney disease with stage 1 through stage 4 chronic kidney disease, or unspecified chronic kidney disease: Secondary | ICD-10-CM | POA: Diagnosis present

## 2015-10-22 DIAGNOSIS — N390 Urinary tract infection, site not specified: Secondary | ICD-10-CM | POA: Diagnosis present

## 2015-10-22 DIAGNOSIS — G40909 Epilepsy, unspecified, not intractable, without status epilepticus: Secondary | ICD-10-CM | POA: Diagnosis present

## 2015-10-22 DIAGNOSIS — K7460 Unspecified cirrhosis of liver: Secondary | ICD-10-CM | POA: Diagnosis present

## 2015-10-22 DIAGNOSIS — R0781 Pleurodynia: Secondary | ICD-10-CM | POA: Diagnosis not present

## 2015-10-22 DIAGNOSIS — F329 Major depressive disorder, single episode, unspecified: Secondary | ICD-10-CM | POA: Diagnosis present

## 2015-10-22 DIAGNOSIS — Z85841 Personal history of malignant neoplasm of brain: Secondary | ICD-10-CM | POA: Diagnosis not present

## 2015-10-22 DIAGNOSIS — K860 Alcohol-induced chronic pancreatitis: Secondary | ICD-10-CM | POA: Diagnosis present

## 2015-10-22 DIAGNOSIS — R1012 Left upper quadrant pain: Secondary | ICD-10-CM | POA: Diagnosis not present

## 2015-10-22 DIAGNOSIS — Z882 Allergy status to sulfonamides status: Secondary | ICD-10-CM | POA: Diagnosis not present

## 2015-10-22 DIAGNOSIS — Z880 Allergy status to penicillin: Secondary | ICD-10-CM | POA: Diagnosis not present

## 2015-10-22 DIAGNOSIS — B957 Other staphylococcus as the cause of diseases classified elsewhere: Secondary | ICD-10-CM | POA: Diagnosis present

## 2015-10-22 LAB — LACTIC ACID, PLASMA: Lactic Acid, Venous: 0.9 mmol/L (ref 0.5–2.0)

## 2015-10-22 LAB — COMPREHENSIVE METABOLIC PANEL
ALBUMIN: 2.4 g/dL — AB (ref 3.5–5.0)
ALT: 21 U/L (ref 14–54)
AST: 79 U/L — ABNORMAL HIGH (ref 15–41)
Alkaline Phosphatase: 176 U/L — ABNORMAL HIGH (ref 38–126)
Anion gap: 3 — ABNORMAL LOW (ref 5–15)
BUN: <5 mg/dL — ABNORMAL LOW (ref 6–20)
CHLORIDE: 109 mmol/L (ref 101–111)
CO2: 21 mmol/L — AB (ref 22–32)
Calcium: 7.4 mg/dL — ABNORMAL LOW (ref 8.9–10.3)
Creatinine, Ser: 0.64 mg/dL (ref 0.44–1.00)
GFR calc non Af Amer: >60 mL/min (ref >60)
GLUCOSE: 97 mg/dL (ref 65–99)
Potassium: 3.6 mmol/L (ref 3.5–5.1)
SODIUM: 133 mmol/L — AB (ref 135–145)
Total Bilirubin: 1.5 mg/dL — ABNORMAL HIGH (ref 0.3–1.2)
Total Protein: 6.4 g/dL — ABNORMAL LOW (ref 6.5–8.1)

## 2015-10-22 LAB — PROTIME-INR
INR: 2.02
PROTHROMBIN TIME: 22.7 s — AB (ref 11.4–15.0)

## 2015-10-22 LAB — CBC WITH DIFFERENTIAL/PLATELET
BASOS PCT: 0 %
Basophils Absolute: 0 10*3/uL (ref 0–0.1)
EOS ABS: 0.1 10*3/uL (ref 0–0.7)
EOS PCT: 1 %
HCT: 24.2 % — ABNORMAL LOW (ref 35.0–47.0)
HEMOGLOBIN: 7.4 g/dL — AB (ref 12.0–16.0)
LYMPHS ABS: 1.5 10*3/uL (ref 1.0–3.6)
Lymphocytes Relative: 21 %
MCH: 22.5 pg — AB (ref 26.0–34.0)
MCHC: 30.7 g/dL — AB (ref 32.0–36.0)
MCV: 73.2 fL — ABNORMAL LOW (ref 80.0–100.0)
MONOS PCT: 10 %
Monocytes Absolute: 0.7 10*3/uL (ref 0.2–0.9)
NEUTROS PCT: 68 %
Neutro Abs: 5 10*3/uL (ref 1.4–6.5)
Platelets: 145 10*3/uL — ABNORMAL LOW (ref 150–440)
RBC: 3.31 MIL/uL — AB (ref 3.80–5.20)
RDW: 23 % — ABNORMAL HIGH (ref 11.5–14.5)
WBC: 7.3 10*3/uL (ref 3.6–11.0)

## 2015-10-22 LAB — APTT: APTT: 41 s — AB (ref 24–36)

## 2015-10-22 LAB — PROCALCITONIN: PROCALCITONIN: 0.15 ng/mL

## 2015-10-22 MED ORDER — IPRATROPIUM-ALBUTEROL 0.5-2.5 (3) MG/3ML IN SOLN: 3.0000 mL | Freq: Four times a day (QID) | RESPIRATORY_TRACT | Status: DC | PRN

## 2015-10-22 MED ORDER — ENOXAPARIN SODIUM 40 MG/0.4ML ~~LOC~~ SOLN
40.0000 mg | SUBCUTANEOUS | Status: DC
  Administered 2015-10-22 – 2015-10-25 (×4): 40 mg via SUBCUTANEOUS
  Filled 2015-10-22 (×5): qty 0.4

## 2015-10-22 MED ORDER — HYDROCORTISONE 0.5 % EX CREA
1.0000 "application " | TOPICAL_CREAM | Freq: Two times a day (BID) | CUTANEOUS | Status: DC | PRN
  Filled 2015-10-22: qty 28.35

## 2015-10-22 MED ORDER — ZOLPIDEM TARTRATE 5 MG PO TABS
10.0000 mg | ORAL_TABLET | Freq: Every day | ORAL | Status: DC
  Administered 2015-10-22 – 2015-10-23 (×2): 10 mg via ORAL
  Filled 2015-10-22 (×2): qty 2

## 2015-10-22 MED ORDER — OXYCODONE HCL 5 MG PO TABS
5.0000 mg | ORAL_TABLET | Freq: Four times a day (QID) | ORAL | Status: DC | PRN
  Administered 2015-10-22 – 2015-10-25 (×8): 5 mg via ORAL
  Filled 2015-10-22 (×8): qty 1

## 2015-10-22 MED ORDER — SERTRALINE HCL 50 MG PO TABS
50.0000 mg | ORAL_TABLET | Freq: Every day | ORAL | Status: DC
  Administered 2015-10-22 – 2015-10-25 (×4): 50 mg via ORAL
  Filled 2015-10-22 (×4): qty 1

## 2015-10-22 MED ORDER — LEVOFLOXACIN IN D5W 750 MG/150ML IV SOLN
750.0000 mg | Freq: Once | INTRAVENOUS | Status: DC
  Filled 2015-10-22: qty 150

## 2015-10-22 MED ORDER — METOPROLOL SUCCINATE ER 25 MG PO TB24
25.0000 mg | ORAL_TABLET | Freq: Every day | ORAL | Status: DC
  Administered 2015-10-22 – 2015-10-25 (×4): 25 mg via ORAL
  Filled 2015-10-22 (×4): qty 1

## 2015-10-22 MED ORDER — ONDANSETRON HCL 4 MG/2ML IJ SOLN
INTRAMUSCULAR | Status: AC
  Filled 2015-10-22: qty 2

## 2015-10-22 MED ORDER — POTASSIUM CHLORIDE CRYS ER 20 MEQ PO TBCR
40.0000 meq | EXTENDED_RELEASE_TABLET | Freq: Once | ORAL | Status: AC
Start: 2015-10-22 — End: 2015-10-22
  Administered 2015-10-22: 40 meq via ORAL
  Filled 2015-10-22: qty 2

## 2015-10-22 MED ORDER — PROMETHAZINE HCL 25 MG PO TABS: 12.5000 mg | ORAL_TABLET | Freq: Four times a day (QID) | ORAL | Status: DC | PRN

## 2015-10-22 MED ORDER — MORPHINE SULFATE (PF) 2 MG/ML IV SOLN
INTRAVENOUS | Status: AC
  Filled 2015-10-22: qty 1

## 2015-10-22 MED ORDER — MORPHINE SULFATE (PF) 2 MG/ML IV SOLN
2.0000 mg | Freq: Once | INTRAVENOUS | Status: AC
  Administered 2015-10-22: 2 mg via INTRAVENOUS

## 2015-10-22 MED ORDER — PRAVASTATIN SODIUM 20 MG PO TABS
10.0000 mg | ORAL_TABLET | Freq: Every day | ORAL | Status: DC
  Administered 2015-10-22: 20 mg via ORAL
  Administered 2015-10-23 – 2015-10-25 (×3): 10 mg via ORAL
  Filled 2015-10-22: qty 2
  Filled 2015-10-22 (×3): qty 1

## 2015-10-22 MED ORDER — OXYCODONE HCL ER 20 MG PO T12A: 20.0000 mg | EXTENDED_RELEASE_TABLET | Freq: Two times a day (BID) | ORAL | Status: DC

## 2015-10-22 MED ORDER — PANCRELIPASE (LIP-PROT-AMYL) 12000-38000 UNITS PO CPEP
24000.0000 [IU] | ORAL_CAPSULE | Freq: Three times a day (TID) | ORAL | Status: DC
  Administered 2015-10-22 – 2015-10-25 (×9): 24000 [IU] via ORAL
  Filled 2015-10-22 (×9): qty 2

## 2015-10-22 MED ORDER — PANTOPRAZOLE SODIUM 40 MG PO TBEC
40.0000 mg | DELAYED_RELEASE_TABLET | Freq: Every day | ORAL | Status: DC
  Administered 2015-10-22 – 2015-10-25 (×4): 40 mg via ORAL
  Filled 2015-10-22 (×4): qty 1

## 2015-10-22 MED ORDER — LEVETIRACETAM 500 MG PO TABS
500.0000 mg | ORAL_TABLET | Freq: Two times a day (BID) | ORAL | Status: DC
  Administered 2015-10-22 – 2015-10-25 (×7): 500 mg via ORAL
  Filled 2015-10-22 (×8): qty 1

## 2015-10-22 MED ORDER — SENNOSIDES-DOCUSATE SODIUM 8.6-50 MG PO TABS: 1.0000 | ORAL_TABLET | Freq: Every evening | ORAL | Status: DC | PRN

## 2015-10-22 MED ORDER — MORPHINE SULFATE (PF) 2 MG/ML IV SOLN: 1.0000 mg | Freq: Once | INTRAVENOUS | Status: DC

## 2015-10-22 MED ORDER — TIOTROPIUM BROMIDE MONOHYDRATE 18 MCG IN CAPS
18.0000 ug | ORAL_CAPSULE | Freq: Every day | RESPIRATORY_TRACT | Status: DC
  Administered 2015-10-22 – 2015-10-25 (×4): 18 ug via RESPIRATORY_TRACT
  Filled 2015-10-22: qty 5

## 2015-10-22 MED ORDER — LEVOFLOXACIN IN D5W 750 MG/150ML IV SOLN
750.0000 mg | INTRAVENOUS | Status: DC
  Administered 2015-10-22 – 2015-10-23 (×2): 750 mg via INTRAVENOUS
  Filled 2015-10-22 (×2): qty 150

## 2015-10-22 MED ORDER — ACETAMINOPHEN 325 MG PO TABS
650.0000 mg | ORAL_TABLET | Freq: Four times a day (QID) | ORAL | Status: DC | PRN
  Administered 2015-10-22 (×2): 650 mg via ORAL
  Filled 2015-10-22 (×2): qty 2

## 2015-10-22 MED ORDER — MOMETASONE FURO-FORMOTEROL FUM 200-5 MCG/ACT IN AERO
2.0000 | INHALATION_SPRAY | Freq: Two times a day (BID) | RESPIRATORY_TRACT | Status: DC
  Administered 2015-10-22 – 2015-10-25 (×6): 2 via RESPIRATORY_TRACT
  Filled 2015-10-22: qty 8.8

## 2015-10-22 MED ORDER — ACETAMINOPHEN 650 MG RE SUPP: 650.0000 mg | Freq: Four times a day (QID) | RECTAL | Status: DC | PRN

## 2015-10-22 MED ORDER — ALPRAZOLAM 0.5 MG PO TABS
1.0000 mg | ORAL_TABLET | Freq: Three times a day (TID) | ORAL | Status: DC | PRN
  Administered 2015-10-22 – 2015-10-25 (×6): 1 mg via ORAL
  Filled 2015-10-22 (×6): qty 2

## 2015-10-22 MED ORDER — AMITRIPTYLINE HCL 25 MG PO TABS
50.0000 mg | ORAL_TABLET | Freq: Every day | ORAL | Status: DC
  Administered 2015-10-22 – 2015-10-24 (×3): 50 mg via ORAL
  Filled 2015-10-22 (×3): qty 2

## 2015-10-22 MED ORDER — GABAPENTIN 300 MG PO CAPS
300.0000 mg | ORAL_CAPSULE | Freq: Three times a day (TID) | ORAL | Status: DC
  Administered 2015-10-22 – 2015-10-25 (×10): 300 mg via ORAL
  Filled 2015-10-22 (×10): qty 1

## 2015-10-22 MED ORDER — SODIUM CHLORIDE 0.9 % IV SOLN
INTRAVENOUS | Status: DC
  Administered 2015-10-22: 19:00:00 via INTRAVENOUS
  Administered 2015-10-22: 100 mL/h via INTRAVENOUS
  Administered 2015-10-23: 05:00:00 via INTRAVENOUS

## 2015-10-22 MED ORDER — DEXTROSE 5 % IV SOLN
1.0000 g | Freq: Three times a day (TID) | INTRAVENOUS | Status: DC
  Filled 2015-10-22 (×3): qty 1

## 2015-10-22 MED ORDER — MORPHINE SULFATE (PF) 2 MG/ML IV SOLN
2.0000 mg | Freq: Once | INTRAVENOUS | Status: AC
  Administered 2015-10-22: 2 mg via INTRAVENOUS
  Filled 2015-10-22: qty 1

## 2015-10-22 NOTE — ED Notes (Signed)
Pt reports being seen in [facility] ED for UTI previously this week. Pt reports she received a call to come back in to the ED b/c her urine culture was abnormal.

## 2015-10-22 NOTE — Progress Notes (Signed)
[name] NAME: [name]    MRN#:  [id number]  DATE OF BIRTH:  04/30/1956  SUBJECTIVE:  Hospital Day: 0 days [name] is a 60 y.o. female presenting with Code Sepsis .   Overnight events: no overnight events, temperature afebrile Interval Events:  sleeping but arousable no complaints this morning seems somewhat confused   REVIEW OF SYSTEMS:  CONSTITUTIONAL: No fever, fatigue or weakness.  EYES: No blurred or double vision.  EARS, NOSE, AND THROAT: No tinnitus or ear pain.  RESPIRATORY: No cough, shortness of breath, wheezing or hemoptysis.  CARDIOVASCULAR: No chest pain, orthopnea, edema.  GASTROINTESTINAL: No nausea, vomiting, diarrhea or abdominal pain.  GENITOURINARY: No dysuria, hematuria.  ENDOCRINE: No polyuria, nocturia,  HEMATOLOGY: No anemia, easy bruising or bleeding SKIN: No rash or lesion. MUSCULOSKELETAL: No joint pain or arthritis.   NEUROLOGIC: No tingling, numbness, weakness.  PSYCHIATRY: No anxiety or depression.   DRUG ALLERGIES:   Allergies  Allergen Reactions  . Ibuprofen Other (See Comments)    Pt states that she is unable to take this medication because of her liver.    . Sulfa Antibiotics Hives  . Amoxicillin Itching and Rash    Has patient had a PCN reaction causing immediate rash, facial/tongue/throat swelling, SOB or lightheadedness with hypotension: No Has patient had a PCN reaction causing severe rash involving mucus membranes or skin necrosis: No Has patient had a PCN reaction that required hospitalization No Has patient had a PCN reaction occurring within the last 10 years: Yes If all of the above answers are "NO", then may proceed with Cephalosporin use.  . Chocolate Rash  . Ciprofloxacin Itching and Rash  . Dilaudid [Hydromorphone Hcl] Itching  . Fentanyl Rash  . Hydromorphone Itching  . Metformin Nausea And Vomiting  . Penicillins Itching, Rash and Other (See  Comments)    Has patient had a PCN reaction causing immediate rash, facial/tongue/throat swelling, SOB or lightheadedness with hypotension: No Has patient had a PCN reaction causing severe rash involving mucus membranes or skin necrosis: No Has patient had a PCN reaction that required hospitalization No Has patient had a PCN reaction occurring within the last 10 years: Yes If all of the above answers are "NO", then may proceed with Cephalosporin use.  . Strawberry Extract Rash  . Zofran [Ondansetron Hcl] Itching and Nausea And Vomiting    VITALS:  Blood pressure 115/68, pulse 84, temperature 98.3 F (36.8 C), temperature source Axillary, resp. rate 18, height 5\' 8"  (1.727 m), weight 104.781 kg (231 lb), SpO2 95 %.  PHYSICAL EXAMINATION:  VITAL SIGNS: Filed Vitals:   10/22/15 0710 10/22/15 1129  BP: 117/71 115/68  Pulse: 95 84  Temp: 99.5 F (37.5 C) 98.3 F (36.8 C)  Resp: 53 18   GENERAL:59 y.o.female currently in no acute distress.  HEAD: Normocephalic, atraumatic.  EYES: Pupils equal, round, reactive to light. Extraocular muscles intact. No scleral icterus.  MOUTH: Moist mucosal membrane. Dentition intact. No abscess noted.  EAR, NOSE, THROAT: Clear without exudates. No external lesions.  NECK: Supple. No thyromegaly. No nodules. No JVD.  PULMONARY: Clear to ascultation, without wheeze rails or rhonci. No use of accessory muscles, Good respiratory effort. good air entry bilaterally CHEST: Nontender to palpation.  CARDIOVASCULAR: S1 and S2. Regular rate and rhythm. No murmurs, rubs, or gallops. No edema. Pedal pulses 2+ bilaterally.  GASTROINTESTINAL: Soft, nontender, nondistended. No masses. Positive bowel sounds. No hepatosplenomegaly.  MUSCULOSKELETAL: No swelling,  clubbing, or edema. Range of motion full in all extremities.  NEUROLOGIC: Cranial nerves II through XII are intact. No gross focal neurological deficits. Sensation intact. Reflexes intact.  SKIN: No ulceration,  lesions, rashes, or cyanosis. Skin warm and dry. Turgor intact.  PSYCHIATRIC: Mood, affect within normal limits. The patient is sleeping but arousable. Insight, judgment poor at this time.      LABORATORY PANEL:   CBC  Recent Labs Lab 10/22/15 0514  WBC 7.3  HGB 7.4*  HCT 24.2*  PLT 145*   ------------------------------------------------------------------------------------------------------------------  Chemistries   Recent Labs Lab 10/22/15 0514  NA 133*  K 3.6  CL 109  CO2 21*  GLUCOSE 97  BUN <5*  CREATININE 0.64  CALCIUM 7.4*  AST 79*  ALT 21  ALKPHOS 176*  BILITOT 1.5*   ------------------------------------------------------------------------------------------------------------------  Cardiac Enzymes  Recent Labs Lab 10/20/15 1600  TROPONINI <0.03   ------------------------------------------------------------------------------------------------------------------  RADIOLOGY:  Dg Chest 2 View  10/22/2015  CLINICAL DATA:  Cough and fever today. EXAM: CHEST  2 VIEW COMPARISON:  10/20/2015 FINDINGS: Linear atelectatic appearing opacities are present in the right base. There is no confluent airspace consolidation. There is no effusion. Hilar, mediastinal and cardiac contours are unremarkable and unchanged. IMPRESSION: Linear opacities in the right base. These may be atelectatic. No confluent consolidation. No effusions. Electronically Signed   By: Andreas Newport M.D.   On: 10/22/2015 01:00   Ct Head Wo Contrast  10/20/2015  CLINICAL DATA:  Acute onset of fever. Recent drainage of subdural hematoma. Initial encounter. EXAM: CT HEAD WITHOUT CONTRAST TECHNIQUE: Contiguous axial images were obtained from the base of the skull through the vertex without intravenous contrast. COMPARISON:  CT of the head performed 09/14/2015 FINDINGS: There is no evidence of acute infarction, mass lesion, or intra- or extra-axial hemorrhage on CT. Mild subdural thickening underlying  the patient's right-sided craniotomy flap is thought to be postoperative in nature, without definite evidence of subdural hematoma. Postoperative encephalomalacia is noted at the left frontotemporal region. The posterior fossa, including the cerebellum, brainstem and fourth ventricle, is within normal limits. The third and lateral ventricles, and basal ganglia are unremarkable in appearance. The cerebral hemispheres are symmetric in appearance, with normal gray-white differentiation. No mass effect or midline shift is seen. There is no evidence of fracture; bilateral craniotomy flaps are noted, with soft tissue swelling overlying the right craniotomy flap. The visualized portions of the orbits are within normal limits. The paranasal sinuses and mastoid air cells are well-aerated. No additional soft tissue abnormalities are seen. IMPRESSION: 1. No definite evidence of acute subdural hematoma. 2. Mild subdural thickening underlying the right-sided craniotomy flap is thought to be postoperative in nature. 3. Postoperative encephalomalacia at the left frontotemporal region. 4. Soft tissue swelling overlying the right craniotomy flap. Electronically Signed   By: Garald Balding M.D.   On: 10/20/2015 19:55   Ct Chest W Contrast  10/20/2015  CLINICAL DATA:  Patient with fever for multiple days. Recent operation for subdural hematoma. Right and left lower quadrant abdominal pain. EXAM: CT CHEST, ABDOMEN AND PELVIS WITHOUT CONTRAST TECHNIQUE: Multidetector CT imaging of the chest, abdomen and pelvis was performed following the standard protocol without IV contrast. COMPARISON:  CT chest 09/16/2015 FINDINGS: CT CHEST FINDINGS Mediastinum/Lymph Nodes: Normal heart size. No pericardial effusion. Coronary arterial vascular calcifications. No axillary, mediastinal or hilar lymphadenopathy. Lungs/Pleura: Expiratory phase imaging. Central airways are patent. Subpleural ground-glass consolidative opacities within the right greater  than left lower lobes. No pleural effusion or  pneumothorax. Musculoskeletal: No aggressive or acute appearing osseous lesions. CT ABDOMEN PELVIS FINDINGS Hepatobiliary: The liver is nodular in contour compatible with cirrhosis. Gallbladder surgically absent. Pancreas: Unremarkable Spleen: Splenomegaly (17 cm). Adrenals/Urinary Tract: Stable 10 mm left adrenal nodule, compatible with adenoma given stability over time. Right adrenal gland is unremarkable. Kidneys enhance symmetrically with contrast. No hydronephrosis. Foley catheter decompresses the urinary bladder. Stomach/Bowel: No abnormal bowel wall thickening or evidence for bowel obstruction. No free fluid or free intraperitoneal air. Normal morphology of the stomach. Vascular/Lymphatic: Normal caliber abdominal aorta. Peripheral calcified atherosclerotic plaque. No retroperitoneal lymphadenopathy. Other: Uterus is surgically absent. Musculoskeletal: No aggressive or acute appearing osseous lesions. Lumbar spine degenerative changes. There is a 6.5 x 2.2 cm fluid collection with surrounding fat stranding within the subcutaneous fat of the right anterior abdominal wall (image 66; series 2). IMPRESSION: Within the subcutaneous fat of the right anterior abdominal wall there is a 6.5 cm fluid collection with surrounding fat stranding. Superimposed infectious process not excluded. Subpleural consolidative opacities within the right greater than left lower lobes favored to represent atelectasis. Infection not excluded. Morphologic changes to the liver compatible with cirrhosis. There is associated splenomegaly. Electronically Signed   By: Lovey Newcomer M.D.   On: 10/20/2015 19:55   Ct Abdomen Pelvis W Contrast  10/20/2015  CLINICAL DATA:  Patient with fever for multiple days. Recent operation for subdural hematoma. Right and left lower quadrant abdominal pain. EXAM: CT CHEST, ABDOMEN AND PELVIS WITHOUT CONTRAST TECHNIQUE: Multidetector CT imaging of the chest,  abdomen and pelvis was performed following the standard protocol without IV contrast. COMPARISON:  CT chest 09/16/2015 FINDINGS: CT CHEST FINDINGS Mediastinum/Lymph Nodes: Normal heart size. No pericardial effusion. Coronary arterial vascular calcifications. No axillary, mediastinal or hilar lymphadenopathy. Lungs/Pleura: Expiratory phase imaging. Central airways are patent. Subpleural ground-glass consolidative opacities within the right greater than left lower lobes. No pleural effusion or pneumothorax. Musculoskeletal: No aggressive or acute appearing osseous lesions. CT ABDOMEN PELVIS FINDINGS Hepatobiliary: The liver is nodular in contour compatible with cirrhosis. Gallbladder surgically absent. Pancreas: Unremarkable Spleen: Splenomegaly (17 cm). Adrenals/Urinary Tract: Stable 10 mm left adrenal nodule, compatible with adenoma given stability over time. Right adrenal gland is unremarkable. Kidneys enhance symmetrically with contrast. No hydronephrosis. Foley catheter decompresses the urinary bladder. Stomach/Bowel: No abnormal bowel wall thickening or evidence for bowel obstruction. No free fluid or free intraperitoneal air. Normal morphology of the stomach. Vascular/Lymphatic: Normal caliber abdominal aorta. Peripheral calcified atherosclerotic plaque. No retroperitoneal lymphadenopathy. Other: Uterus is surgically absent. Musculoskeletal: No aggressive or acute appearing osseous lesions. Lumbar spine degenerative changes. There is a 6.5 x 2.2 cm fluid collection with surrounding fat stranding within the subcutaneous fat of the right anterior abdominal wall (image 66; series 2). IMPRESSION: Within the subcutaneous fat of the right anterior abdominal wall there is a 6.5 cm fluid collection with surrounding fat stranding. Superimposed infectious process not excluded. Subpleural consolidative opacities within the right greater than left lower lobes favored to represent atelectasis. Infection not excluded.  Morphologic changes to the liver compatible with cirrhosis. There is associated splenomegaly. Electronically Signed   By: Lovey Newcomer M.D.   On: 10/20/2015 19:55   Dg Chest Port 1 View  10/22/2015  CLINICAL DATA:  Sepsis EXAM: PORTABLE CHEST 1 VIEW COMPARISON:  10/21/2015 FINDINGS: Persistent linear right base opacities. Left lung is clear. Pulmonary vasculature is normal. No large effusions. IMPRESSION: Unchanged linear right base opacities, likely atelectatic. Electronically Signed   By: Andreas Newport M.D.   On:  10/22/2015 06:16   Dg Chest Port 1 View  10/20/2015  CLINICAL DATA:  Patient with fever. EXAM: PORTABLE CHEST 1 VIEW COMPARISON:  Chest radiograph 09/16/2015; chest CT 09/16/2015 FINDINGS: Monitoring leads overlie the patient. Low lung volumes. Stable enlarged cardiac and mediastinal contours. Minimal bandlike opacities right lower lung. No pleural effusion or pneumothorax. IMPRESSION: Low lung volumes with probable atelectasis right lung base. Electronically Signed   By: Lovey Newcomer M.D.   On: 10/20/2015 16:14    EKG:   Orders placed or performed during the hospital encounter of 10/21/15  . ED EKG 12-Lead  . ED EKG 12-Lead  . EKG 12-Lead  . EKG 12-Lead    ASSESSMENT AND PLAN:   [name] is a 60 y.o. female presenting with Code Sepsis . Admitted 10/21/2015 : Day #: 0 days  1. Sepsis, meeting septic criteria by temperature/heart rate present on arrival. Source UTI Code sepsis . Panculture. Broad-spectrum antibiotics including -downgrade antibiotics to levaquin given cultures 2. GERD without esophagitis: PPI therapy 3. Hyperlipidemia unspecified statin therapy 4. Seizure disorder Keppra 5. Venous thromboembolism prophylactic: Lovenox   All the records are reviewed and case discussed with Care Management/Social Workerr. Management plans discussed with the patient, family and they are in agreement.  CODE STATUS: full TOTAL TIME TAKING CARE OF THIS PATIENT: 28 minutes.    POSSIBLE D/C IN 1-2DAYS, DEPENDING ON CLINICAL CONDITION.   Hower,  Karenann Cai.D on 10/22/2015 at 12:04 PM  Between 7am to 6pm - Pager - [phone]  After 6pm: House Pager: - [phone]  [facility]  Office  [phone]  CC: Primary care physician; Cletis Athens, MD

## 2015-10-22 NOTE — Progress Notes (Signed)
°   10/22/15 2000  Clinical Encounter Type  Visited With Patient  Visit Type Initial  Referral From Nurse  Consult/Referral To Chaplain  Spiritual Encounters  Spiritual Needs Emotional  Stress Factors  Patient Stress Factors Health changes  Pastoral care visit. [place] Ext [phone]

## 2015-10-22 NOTE — ED Notes (Signed)
Called floor to let them know pt on the way up 

## 2015-10-22 NOTE — Progress Notes (Signed)
Pharmacy Antibiotic Note  [name] is a 60 y.o. female admitted on 10/21/2015 with urosepsis.  Pharmacy has been consulted for aztreonam and levofloxacin dosing.  Plan: 1. Aztreonam 1 gm IV Q8H 2. Levofloxacin 750 mg IV Q24H.  Height: 5\' 8"  (172.7 cm) Weight: 231 lb (104.781 kg) IBW/kg (Calculated) : 63.9  Temp (24hrs), Avg:100.7 F (38.2 C), Min:99.4 F (37.4 C), Max:102.1 F (38.9 C)   Recent Labs Lab 10/20/15 1600 10/21/15 2301  WBC 10.1 11.6*  CREATININE 0.68 0.76  LATICACIDVEN 1.1 1.5    Estimated Creatinine Clearance: 97.2 mL/min (by C-G formula based on Cr of 0.76).    Allergies  Allergen Reactions  . Ibuprofen Other (See Comments)    Pt states that she is unable to take this medication because of her liver.    . Sulfa Antibiotics Hives  . Amoxicillin Itching and Rash    Has patient had a PCN reaction causing immediate rash, facial/tongue/throat swelling, SOB or lightheadedness with hypotension: No Has patient had a PCN reaction causing severe rash involving mucus membranes or skin necrosis: No Has patient had a PCN reaction that required hospitalization No Has patient had a PCN reaction occurring within the last 10 years: Yes If all of the above answers are "NO", then may proceed with Cephalosporin use.  . Chocolate Rash  . Ciprofloxacin Itching and Rash  . Dilaudid [Hydromorphone Hcl] Itching  . Fentanyl Rash  . Hydromorphone Itching  . Metformin Nausea And Vomiting  . Penicillins Itching, Rash and Other (See Comments)    Has patient had a PCN reaction causing immediate rash, facial/tongue/throat swelling, SOB or lightheadedness with hypotension: No Has patient had a PCN reaction causing severe rash involving mucus membranes or skin necrosis: No Has patient had a PCN reaction that required hospitalization No Has patient had a PCN reaction occurring within the last 10 years: Yes If all of the above answers are "NO", then may proceed with Cephalosporin  use.  . Strawberry Extract Rash  . Zofran [Ondansetron Hcl] Itching and Nausea And Vomiting    Thank you for allowing pharmacy to be a part of this patient's care.  Laural Benes, Pharm.D., BCPS Clinical Pharmacist 10/22/2015 5:52 AM

## 2015-10-22 NOTE — H&P (Signed)
[facility] NAME: [name]    MR#:  [id number]  DATE OF BIRTH:  1955-08-12  DATE OF ADMISSION:  10/21/2015  PRIMARY CARE PHYSICIAN: Cletis Athens, MD   REQUESTING/REFERRING PHYSICIAN:   CHIEF COMPLAINT:   Chief Complaint  Patient presents with  . Code Sepsis    HISTORY OF PRESENT ILLNESS: [name]  is a 60 y.o. female with a known history of bronchitis, GERD, brain tumor hepatitis C, chronic kidney disease, seizure disorder, cirrhosis of liver presented to the emergency room because of low back pain and burning sensation when she passes urine. This is been going on for the last few days to a week. Patient has low back pain which is aching in nature 4 out of 10. She has fever and chills. Patient was worked up in the emergency room was found to have UTI with sepsis. Started on IV fluids and IV antibiotics. No history of any nausea or vomiting. No history of any abdominal pain. No history of headache dizziness blurry vision. Has generalized weakness.  PAST MEDICAL HISTORY:   Past Medical History  Diagnosis Date  . Emphysema of lung ([place])   . Depression   . Diverticulitis   . Chronic bronchitis ([place])   . Malignant brain tumor ([place]) 2007  . GERD (gastroesophageal reflux disease)   . Allergy   . Hepatitis C   . Hypertension   . Chronic kidney disease   . Migraines   . Urine incontinence   . Seizures ([place])   . Stroke [facility]) 2007    during brain surgery  . Pancreatitis, alcoholic 0000000  . Rectovaginal fistula   . H/O ETOH abuse     Sober since 2009  . H/O drug abuse     Clean since 2009  . Pancreatic ascites   . Cirrhosis ([place])   . Diabetes mellitus without complication ([place])     PAST SURGICAL HISTORY: Past Surgical History  Procedure Laterality Date  . Appendectomy  1999  . Eye surgery    . Rectovaginal fistula repair w/ colostomy  2011  . Brain surgery  2007    malignant brain tumor  . Cholecystectomy   2001  . Abdominal hysterectomy  1979  . Craniotomy Right 08/16/2015    Procedure: Right Fronto-Temporal-Parietal Craniotomy for Evacuation of Hematoma with Bone Flap Placement in Abdomen;  Surgeon: Ashok Pall, MD;  Location: [place] NEURO ORS;  Service: Neurosurgery;  Laterality: Right;  . Cranioplasty N/A 09/15/2015    Procedure: Cranioplasty with retrieval of bone flap from abdominal pocket;  Surgeon: Ashok Pall, MD;  Location: [place] NEURO ORS;  Service: Neurosurgery;  Laterality: N/A;  Cranioplasty with retrieval of bone flap from abdominal pocket    SOCIAL HISTORY:  Social History  Substance Use Topics  . Smoking status: Light Tobacco Smoker -- 0.50 packs/day    Types: Cigarettes  . Smokeless tobacco: Never Used     Comment: cutting back  . Alcohol Use: 2.4 oz/week    0 Standard drinks or equivalent, 4 Cans of beer per week    FAMILY HISTORY:  Family History  Problem Relation Age of Onset  . Hypertension Mother   . Heart disease Father   . Diabetes Father   . Cancer Sister     brain  . Cancer Grandchild 8    brain tumor    DRUG ALLERGIES:  Allergies  Allergen Reactions  . Ibuprofen Other (See Comments)    Pt states that  she is unable to take this medication because of her liver.    . Sulfa Antibiotics Hives  . Amoxicillin Itching and Rash    Has patient had a PCN reaction causing immediate rash, facial/tongue/throat swelling, SOB or lightheadedness with hypotension: No Has patient had a PCN reaction causing severe rash involving mucus membranes or skin necrosis: No Has patient had a PCN reaction that required hospitalization No Has patient had a PCN reaction occurring within the last 10 years: Yes If all of the above answers are "NO", then may proceed with Cephalosporin use.  . Chocolate Rash  . Ciprofloxacin Itching and Rash  . Dilaudid [Hydromorphone Hcl] Itching  . Fentanyl Rash  . Hydromorphone Itching  . Metformin Nausea And Vomiting  . Penicillins Itching, Rash  and Other (See Comments)    Has patient had a PCN reaction causing immediate rash, facial/tongue/throat swelling, SOB or lightheadedness with hypotension: No Has patient had a PCN reaction causing severe rash involving mucus membranes or skin necrosis: No Has patient had a PCN reaction that required hospitalization No Has patient had a PCN reaction occurring within the last 10 years: Yes If all of the above answers are "NO", then may proceed with Cephalosporin use.  . Strawberry Extract Rash  . Zofran [Ondansetron Hcl] Itching and Nausea And Vomiting    REVIEW OF SYSTEMS:   CONSTITUTIONAL: Low grade fever, has weakness.  EYES: No blurred or double vision.  EARS, NOSE, AND THROAT: No tinnitus or ear pain.  RESPIRATORY: No cough, shortness of breath, wheezing or hemoptysis.  CARDIOVASCULAR: No chest pain, orthopnea, edema.  GASTROINTESTINAL: No nausea, vomiting, diarrhea or abdominal pain.  GENITOURINARY: Has dysuria,no hematuria.  ENDOCRINE: No polyuria, nocturia,  HEMATOLOGY: No anemia, easy bruising or bleeding SKIN: No rash or lesion. MUSCULOSKELETAL: No joint pain or arthritis. Has low back pain NEUROLOGIC: No tingling, numbness, weakness.  PSYCHIATRY: No anxiety or depression.   MEDICATIONS AT HOME:  Prior to Admission medications   Medication Sig Start Date End Date Taking? Authorizing Provider  ALPRAZolam Duanne Moron) 1 MG tablet Take 1 tablet (1 mg total) by mouth 3 (three) times daily as needed for anxiety. 09/22/15  Yes Ashok Pall, MD  amitriptyline (ELAVIL) 50 MG tablet Take 1 tablet (50 mg total) by mouth at bedtime. 12/01/12  Yes Jackolyn Confer, MD  cephALEXin (KEFLEX) 500 MG capsule Take 1 capsule (500 mg total) by mouth 3 (three) times daily. 10/20/15  Yes Carrie Mew, MD  Fluticasone-Salmeterol (ADVAIR) 500-50 MCG/DOSE AEPB Inhale 1 puff into the lungs 2 (two) times daily.   Yes Historical Provider, MD  furosemide (LASIX) 20 MG tablet Take 20 mg by mouth daily.   Yes  Historical Provider, MD  gabapentin (NEURONTIN) 300 MG capsule Take 300 mg by mouth 3 (three) times daily.    Yes Historical Provider, MD  hydrocortisone cream 0.5 % Apply 1 application topically 2 (two) times daily as needed for itching. 06/16/15  Yes Rubie Maid, MD  Ipratropium-Albuterol (COMBIVENT) 20-100 MCG/ACT AERS respimat Inhale 2 puffs into the lungs every 6 (six) hours as needed for wheezing or shortness of breath.    Yes Historical Provider, MD  levETIRAcetam (KEPPRA) 500 MG tablet Take 1 tablet (500 mg total) by mouth 2 (two) times daily. 11/03/12  Yes Jackolyn Confer, MD  metoprolol succinate (TOPROL-XL) 25 MG 24 hr tablet Take 1 tablet (25 mg total) by mouth daily. 10/21/12  Yes Raquel Dagoberto Ligas, NP  omeprazole (PRILOSEC) 20 MG capsule Take 1 capsule (20  mg total) by mouth daily. 07/26/14  Yes [name], MD  Oxycodone HCl 20 MG TABS Take 20 mg by mouth every 4 (four) hours as needed (for pain).   Yes Historical Provider, MD  Pancrelipase, Lip-Prot-Amyl, (CREON) 24000 units CPEP Take 24,000 Units by mouth 3 (three) times daily with meals.   Yes Historical Provider, MD  pravastatin (PRAVACHOL) 10 MG tablet Take 10 mg by mouth daily.    Yes Historical Provider, MD  promethazine (PHENERGAN) 25 MG tablet Take 25 mg by mouth every 8 (eight) hours as needed for nausea or vomiting.   Yes Historical Provider, MD  sertraline (ZOLOFT) 50 MG tablet Take 50 mg by mouth daily.    Yes Historical Provider, MD  spironolactone (ALDACTONE) 50 MG tablet Take 50 mg by mouth 2 (two) times daily.   Yes Historical Provider, MD  terconazole (TERAZOL 7) 0.4 % vaginal cream Place 1 applicator vaginally at bedtime. 10/16/15  Yes Rubie Maid, MD  tiotropium (SPIRIVA) 18 MCG inhalation capsule Place 18 mcg into inhaler and inhale daily.    Yes Historical Provider, MD  triamcinolone (NASACORT ALLERGY 24HR) 55 MCG/ACT AERO nasal inhaler Place 2 sprays into the nose daily as needed (for rhinitis).   Yes Historical  Provider, MD  zolpidem (AMBIEN) 10 MG tablet Take 1 tablet (10 mg total) by mouth at bedtime. 10/21/12  Yes Raquel Dagoberto Ligas, NP  clindamycin (CLEOCIN) 300 MG capsule Take 1 capsule (300 mg total) by mouth every 8 (eight) hours. Patient not taking: Reported on 10/20/2015 09/22/15   Ashok Pall, MD  oxyCODONE (OXY IR/ROXICODONE) 5 MG immediate release tablet Take 1 tablet (5 mg total) by mouth every 6 (six) hours as needed for moderate pain. Patient not taking: Reported on 10/20/2015 09/22/15   Ashok Pall, MD  oxyCODONE (OXYCONTIN) 20 mg 12 hr tablet Take 1 tablet (20 mg total) by mouth every 12 (twelve) hours. Patient not taking: Reported on 10/20/2015 08/29/15   Ashok Pall, MD      PHYSICAL EXAMINATION:   VITAL SIGNS: Blood pressure 116/63, pulse 95, temperature 99.7 F (37.6 C), temperature source Oral, resp. rate 23, height 5\' 8"  (1.727 m), weight 104.781 kg (231 lb), SpO2 98 %.  GENERAL:  60 y.o.-year-old patient lying in the bed with no acute distress.  EYES: Pupils equal, round, reactive to light and accommodation. No scleral icterus. Extraocular muscles intact.  HEENT: Head atraumatic, normocephalic. Oropharynx and nasopharynx clear.  NECK:  Supple, no jugular venous distention. No thyroid enlargement, no tenderness.  LUNGS: Normal breath sounds bilaterally, no wheezing, rales,rhonchi or crepitation. No use of accessory muscles of respiration.  CARDIOVASCULAR: S1, S2 normal. No murmurs, rubs, or gallops.  ABDOMEN: Soft, nontender, nondistended. Bowel sounds present. No organomegaly or mass.  EXTREMITIES: No pedal edema, cyanosis, or clubbing.  NEUROLOGIC: Cranial nerves II through XII are intact. Muscle strength 5/5 in all extremities. Sensation intact. Gait not checked.  PSYCHIATRIC: The patient is alert and oriented x 3.  SKIN: No obvious rash, lesion, or ulcer.   LABORATORY PANEL:   CBC  Recent Labs Lab 10/20/15 1600 10/21/15 2301  WBC 10.1 11.6*  HGB 9.2* 8.4*  HCT 29.7*  27.0*  PLT 188 175  MCV 72.5* 72.3*  MCH 22.5* 22.4*  MCHC 31.0* 31.0*  RDW 23.0* 23.3*  LYMPHSABS 1.8 2.1  MONOABS 0.9 1.0*  EOSABS 0.0 0.1  BASOSABS 0.1 0.0   ------------------------------------------------------------------------------------------------------------------  Chemistries   Recent Labs Lab 10/20/15 1600 10/21/15 2301  NA 132* 130*  K 3.8 3.1*  CL 104 103  CO2 20* 20*  GLUCOSE 86 91  BUN <5* 5*  CREATININE 0.68 0.76  CALCIUM 8.5* 8.4*  AST 56* 67*  ALT 22 22  ALKPHOS 223* 207*  BILITOT 1.8* 1.4*   ------------------------------------------------------------------------------------------------------------------ estimated creatinine clearance is 97.2 mL/min (by C-G formula based on Cr of 0.76). ------------------------------------------------------------------------------------------------------------------ No results for input(s): TSH, T4TOTAL, T3FREE, THYROIDAB in the last 72 hours.  Invalid input(s): FREET3   Coagulation profile  Recent Labs Lab 10/20/15 1600  INR 1.54   ------------------------------------------------------------------------------------------------------------------- No results for input(s): DDIMER in the last 72 hours. -------------------------------------------------------------------------------------------------------------------  Cardiac Enzymes  Recent Labs Lab 10/20/15 1600  TROPONINI <0.03   ------------------------------------------------------------------------------------------------------------------ Invalid input(s): POCBNP  ---------------------------------------------------------------------------------------------------------------  Urinalysis    Component Value Date/Time   COLORURINE AMBER* 10/20/2015 1731   COLORURINE Yellow 10/16/2014 0605   APPEARANCEUR CLOUDY* 10/20/2015 1731   APPEARANCEUR Hazy 10/16/2014 0605   LABSPEC 1.015 10/20/2015 1731   LABSPEC 1.006 10/16/2014 0605   PHURINE 5.0  10/20/2015 1731   PHURINE 6.0 10/16/2014 0605   GLUCOSEU NEGATIVE 10/20/2015 1731   GLUCOSEU Negative 10/16/2014 0605   HGBUR 2+* 10/20/2015 1731   HGBUR 1+ 10/16/2014 0605   BILIRUBINUR NEGATIVE 10/20/2015 1731   BILIRUBINUR Negative 10/16/2014 0605   BILIRUBINUR neg 12/30/2012 1219   [place] 10/20/2015 1731   KETONESUR Negative 10/16/2014 0605   PROTEINUR NEGATIVE 10/20/2015 1731   PROTEINUR Negative 10/16/2014 0605   PROTEINUR neg 12/30/2012 1219   UROBILINOGEN 2.0* 06/07/2014 1916   UROBILINOGEN 4.0 12/30/2012 1219   NITRITE POSITIVE* 10/20/2015 1731   NITRITE Negative 10/16/2014 0605   NITRITE neg 12/30/2012 1219   LEUKOCYTESUR 2+* 10/20/2015 1731   LEUKOCYTESUR Negative 10/16/2014 0605     RADIOLOGY: Dg Chest 2 View  10/22/2015  CLINICAL DATA:  Cough and fever today. EXAM: CHEST  2 VIEW COMPARISON:  10/20/2015 FINDINGS: Linear atelectatic appearing opacities are present in the right base. There is no confluent airspace consolidation. There is no effusion. Hilar, mediastinal and cardiac contours are unremarkable and unchanged. IMPRESSION: Linear opacities in the right base. These may be atelectatic. No confluent consolidation. No effusions. Electronically Signed   By: Andreas Newport M.D.   On: 10/22/2015 01:00   Ct Head Wo Contrast  10/20/2015  CLINICAL DATA:  Acute onset of fever. Recent drainage of subdural hematoma. Initial encounter. EXAM: CT HEAD WITHOUT CONTRAST TECHNIQUE: Contiguous axial images were obtained from the base of the skull through the vertex without intravenous contrast. COMPARISON:  CT of the head performed 09/14/2015 FINDINGS: There is no evidence of acute infarction, mass lesion, or intra- or extra-axial hemorrhage on CT. Mild subdural thickening underlying the patient's right-sided craniotomy flap is thought to be postoperative in nature, without definite evidence of subdural hematoma. Postoperative encephalomalacia is noted at the left  frontotemporal region. The posterior fossa, including the cerebellum, brainstem and fourth ventricle, is within normal limits. The third and lateral ventricles, and basal ganglia are unremarkable in appearance. The cerebral hemispheres are symmetric in appearance, with normal gray-white differentiation. No mass effect or midline shift is seen. There is no evidence of fracture; bilateral craniotomy flaps are noted, with soft tissue swelling overlying the right craniotomy flap. The visualized portions of the orbits are within normal limits. The paranasal sinuses and mastoid air cells are well-aerated. No additional soft tissue abnormalities are seen. IMPRESSION: 1. No definite evidence of acute subdural hematoma. 2. Mild subdural thickening underlying the right-sided craniotomy flap is thought to be postoperative  in nature. 3. Postoperative encephalomalacia at the left frontotemporal region. 4. Soft tissue swelling overlying the right craniotomy flap. Electronically Signed   By: Garald Balding M.D.   On: 10/20/2015 19:55   Ct Chest W Contrast  10/20/2015  CLINICAL DATA:  Patient with fever for multiple days. Recent operation for subdural hematoma. Right and left lower quadrant abdominal pain. EXAM: CT CHEST, ABDOMEN AND PELVIS WITHOUT CONTRAST TECHNIQUE: Multidetector CT imaging of the chest, abdomen and pelvis was performed following the standard protocol without IV contrast. COMPARISON:  CT chest 09/16/2015 FINDINGS: CT CHEST FINDINGS Mediastinum/Lymph Nodes: Normal heart size. No pericardial effusion. Coronary arterial vascular calcifications. No axillary, mediastinal or hilar lymphadenopathy. Lungs/Pleura: Expiratory phase imaging. Central airways are patent. Subpleural ground-glass consolidative opacities within the right greater than left lower lobes. No pleural effusion or pneumothorax. Musculoskeletal: No aggressive or acute appearing osseous lesions. CT ABDOMEN PELVIS FINDINGS Hepatobiliary: The liver is  nodular in contour compatible with cirrhosis. Gallbladder surgically absent. Pancreas: Unremarkable Spleen: Splenomegaly (17 cm). Adrenals/Urinary Tract: Stable 10 mm left adrenal nodule, compatible with adenoma given stability over time. Right adrenal gland is unremarkable. Kidneys enhance symmetrically with contrast. No hydronephrosis. Foley catheter decompresses the urinary bladder. Stomach/Bowel: No abnormal bowel wall thickening or evidence for bowel obstruction. No free fluid or free intraperitoneal air. Normal morphology of the stomach. Vascular/Lymphatic: Normal caliber abdominal aorta. Peripheral calcified atherosclerotic plaque. No retroperitoneal lymphadenopathy. Other: Uterus is surgically absent. Musculoskeletal: No aggressive or acute appearing osseous lesions. Lumbar spine degenerative changes. There is a 6.5 x 2.2 cm fluid collection with surrounding fat stranding within the subcutaneous fat of the right anterior abdominal wall (image 66; series 2). IMPRESSION: Within the subcutaneous fat of the right anterior abdominal wall there is a 6.5 cm fluid collection with surrounding fat stranding. Superimposed infectious process not excluded. Subpleural consolidative opacities within the right greater than left lower lobes favored to represent atelectasis. Infection not excluded. Morphologic changes to the liver compatible with cirrhosis. There is associated splenomegaly. Electronically Signed   By: Lovey Newcomer M.D.   On: 10/20/2015 19:55   Ct Abdomen Pelvis W Contrast  10/20/2015  CLINICAL DATA:  Patient with fever for multiple days. Recent operation for subdural hematoma. Right and left lower quadrant abdominal pain. EXAM: CT CHEST, ABDOMEN AND PELVIS WITHOUT CONTRAST TECHNIQUE: Multidetector CT imaging of the chest, abdomen and pelvis was performed following the standard protocol without IV contrast. COMPARISON:  CT chest 09/16/2015 FINDINGS: CT CHEST FINDINGS Mediastinum/Lymph Nodes: Normal heart  size. No pericardial effusion. Coronary arterial vascular calcifications. No axillary, mediastinal or hilar lymphadenopathy. Lungs/Pleura: Expiratory phase imaging. Central airways are patent. Subpleural ground-glass consolidative opacities within the right greater than left lower lobes. No pleural effusion or pneumothorax. Musculoskeletal: No aggressive or acute appearing osseous lesions. CT ABDOMEN PELVIS FINDINGS Hepatobiliary: The liver is nodular in contour compatible with cirrhosis. Gallbladder surgically absent. Pancreas: Unremarkable Spleen: Splenomegaly (17 cm). Adrenals/Urinary Tract: Stable 10 mm left adrenal nodule, compatible with adenoma given stability over time. Right adrenal gland is unremarkable. Kidneys enhance symmetrically with contrast. No hydronephrosis. Foley catheter decompresses the urinary bladder. Stomach/Bowel: No abnormal bowel wall thickening or evidence for bowel obstruction. No free fluid or free intraperitoneal air. Normal morphology of the stomach. Vascular/Lymphatic: Normal caliber abdominal aorta. Peripheral calcified atherosclerotic plaque. No retroperitoneal lymphadenopathy. Other: Uterus is surgically absent. Musculoskeletal: No aggressive or acute appearing osseous lesions. Lumbar spine degenerative changes. There is a 6.5 x 2.2 cm fluid collection with surrounding fat stranding within the subcutaneous  fat of the right anterior abdominal wall (image 66; series 2). IMPRESSION: Within the subcutaneous fat of the right anterior abdominal wall there is a 6.5 cm fluid collection with surrounding fat stranding. Superimposed infectious process not excluded. Subpleural consolidative opacities within the right greater than left lower lobes favored to represent atelectasis. Infection not excluded. Morphologic changes to the liver compatible with cirrhosis. There is associated splenomegaly. Electronically Signed   By: Lovey Newcomer M.D.   On: 10/20/2015 19:55   Dg Chest Port 1  View  10/20/2015  CLINICAL DATA:  Patient with fever. EXAM: PORTABLE CHEST 1 VIEW COMPARISON:  Chest radiograph 09/16/2015; chest CT 09/16/2015 FINDINGS: Monitoring leads overlie the patient. Low lung volumes. Stable enlarged cardiac and mediastinal contours. Minimal bandlike opacities right lower lung. No pleural effusion or pneumothorax. IMPRESSION: Low lung volumes with probable atelectasis right lung base. Electronically Signed   By: Lovey Newcomer M.D.   On: 10/20/2015 16:14    EKG: Orders placed or performed during the hospital encounter of 10/21/15  . ED EKG 12-Lead  . ED EKG 12-Lead  . EKG 12-Lead  . EKG 12-Lead    IMPRESSION AND PLAN: 60 year old a female patient with history of for brain tumor, COPD, hypertension, cirrhosis of liver presented to the emergency room with fever, dysuria and low back pain. Admitting diagnosis 1. Sepsis 2. Urinary tract infection 3. Hyponatremia 4. Hypokalemia 5. History of brain tumor Treatment plan Mid patient to medical floor IV fluid hydration Start patient on IV Levaquin and IV aztreonam antibiotics DVT prophylaxis subcutaneous Lovenox Replace potassium Follow-up sodium level Check cultures.  All the records are reviewed and case discussed with ED provider. Management plans discussed with the patient, family and they are in agreement.  CODE STATUS:FULL    Code Status Orders        Start     Ordered   10/22/15 0426  Full code   Continuous     10/22/15 0426    Code Status History    Date Active Date Inactive Code Status Order ID Comments User Context   08/16/2015  9:36 PM 08/30/2015  6:11 PM Full Code [id number]  Ashok Pall, MD Inpatient   06/22/2015  1:45 AM 06/22/2015  6:09 PM Full Code [id number]  Lytle Butte, MD ED   05/16/2015 12:00 PM 05/16/2015  6:17 PM DNR [id number]  Max Sane, MD Inpatient   05/15/2015  1:10 AM 05/16/2015 12:00 PM Full Code [id number]  Harrie Foreman, MD Inpatient   11/05/2014 12:35 PM 11/05/2014  3:55 PM  Full Code [id number]  Penne Lash, RN Inpatient   11/05/2014 12:17 PM 11/05/2014 12:35 PM Full Code [id number]  Penne Lash, RN Inpatient       TOTAL TIME TAKING CARE OF THIS PATIENT: 51 minutes.    Saundra Shelling M.D on 10/22/2015 at 4:30 AM  Between 7am to 6pm - Pager - [phone]  After 6pm go to [email or web address] - password [email or web address] [facility]  Office  [phone]  CC: Primary care physician; Cletis Athens, MD

## 2015-10-22 NOTE — Progress Notes (Signed)
pt's right swelling slightly worse than this AM. Called Dr Lavetta Nielsen regarding patient status orders received.

## 2015-10-23 ENCOUNTER — Inpatient Hospital Stay: Payer: Medicare Other

## 2015-10-23 LAB — FIBRIN DERIVATIVES D-DIMER (ARMC ONLY): Fibrin derivatives D-dimer (ARMC): 999 — ABNORMAL HIGH (ref 0–499)

## 2015-10-23 LAB — CBC
HCT: 24.7 % — ABNORMAL LOW (ref 35.0–47.0)
Hemoglobin: 7.7 g/dL — ABNORMAL LOW (ref 12.0–16.0)
MCH: 22.6 pg — AB (ref 26.0–34.0)
MCHC: 31.1 g/dL — ABNORMAL LOW (ref 32.0–36.0)
MCV: 72.7 fL — ABNORMAL LOW (ref 80.0–100.0)
PLATELETS: 130 10*3/uL — AB (ref 150–440)
RBC: 3.4 MIL/uL — AB (ref 3.80–5.20)
RDW: 23.4 % — ABNORMAL HIGH (ref 11.5–14.5)
WBC: 6.6 10*3/uL (ref 3.6–11.0)

## 2015-10-23 LAB — BASIC METABOLIC PANEL
Anion gap: 4 — ABNORMAL LOW (ref 5–15)
BUN: 5 mg/dL — AB (ref 6–20)
CO2: 19 mmol/L — ABNORMAL LOW (ref 22–32)
Calcium: 8.3 mg/dL — ABNORMAL LOW (ref 8.9–10.3)
Chloride: 113 mmol/L — ABNORMAL HIGH (ref 101–111)
Creatinine, Ser: 0.54 mg/dL (ref 0.44–1.00)
GFR calc Af Amer: >60 mL/min (ref >60)
Glucose, Bld: 80 mg/dL (ref 65–99)
POTASSIUM: 4.2 mmol/L (ref 3.5–5.1)
SODIUM: 136 mmol/L (ref 135–145)

## 2015-10-23 MED ORDER — PROCHLORPERAZINE EDISYLATE 5 MG/ML IJ SOLN
10.0000 mg | INTRAMUSCULAR | Status: DC | PRN
  Filled 2015-10-23: qty 2

## 2015-10-23 MED ORDER — MORPHINE SULFATE (PF) 2 MG/ML IV SOLN
2.0000 mg | Freq: Once | INTRAVENOUS | Status: AC
  Administered 2015-10-23: 2 mg via INTRAVENOUS
  Filled 2015-10-23: qty 1

## 2015-10-23 MED ORDER — MORPHINE SULFATE (PF) 2 MG/ML IV SOLN
2.0000 mg | INTRAVENOUS | Status: DC | PRN
  Administered 2015-10-23 – 2015-10-24 (×3): 2 mg via INTRAVENOUS
  Filled 2015-10-23 (×4): qty 1

## 2015-10-23 MED ORDER — DIPHENHYDRAMINE HCL 25 MG PO CAPS: 25.0000 mg | ORAL_CAPSULE | ORAL | Status: DC | PRN

## 2015-10-23 MED ORDER — CEFTRIAXONE SODIUM 2 G IJ SOLR
2.0000 g | INTRAMUSCULAR | Status: DC
  Administered 2015-10-24: 2 g via INTRAVENOUS
  Filled 2015-10-23: qty 2

## 2015-10-23 MED ORDER — DEXTROSE 5 % IV SOLN
2.0000 g | INTRAVENOUS | Status: DC
  Administered 2015-10-23: 2 g via INTRAVENOUS
  Filled 2015-10-23: qty 2

## 2015-10-23 NOTE — Progress Notes (Signed)
VQ scan scheduled for Tuesday morning

## 2015-10-23 NOTE — Progress Notes (Signed)
Pt alert and oriented x4, complaints of pain and discomfort 5/10.  Bed in low position, call bell within reach.  Bed alarms on and functioning.  Assessment done and charted.  Will continue to monitor and do hourly rounding throughout the shift

## 2015-10-23 NOTE — Progress Notes (Signed)
Initial Nutrition Assessment  DOCUMENTATION CODES:   Obesity unspecified  INTERVENTION:   -Cater to pt preferences on Heart/Carb Modified diet order -Will recommend supplement on follow if intake inadequate -Recommend daily weights   NUTRITION DIAGNOSIS:   Unintentional weight loss related to poor appetite as evidenced by per patient/family report.  GOAL:   Patient will meet greater than or equal to 90% of their needs  MONITOR:   PO intake, Labs, I & O's, Skin, Weight trends  REASON FOR ASSESSMENT:   Malnutrition Screening Tool    ASSESSMENT:   Pt with known h/o brain tumor,hep C, cirrhosis admitted with sepsis, UTI, hypokalemia and hyponatremia. Pt on isolation for MRSA. Attempted to visit pt 3 times today, pt with MD and unavailable this afternoon with Nsg attempts to place IV.  Past Medical History  Diagnosis Date  . Emphysema of lung ([place])   . Depression   . Diverticulitis   . Chronic bronchitis ([place])   . Malignant brain tumor ([place]) 2007  . GERD (gastroesophageal reflux disease)   . Allergy   . Hepatitis C   . Hypertension   . Chronic kidney disease   . Migraines   . Urine incontinence   . Seizures ([place])   . Stroke [facility]) 2007    during brain surgery  . Pancreatitis, alcoholic 0000000  . Rectovaginal fistula   . H/O ETOH abuse     Sober since 2009  . H/O drug abuse     Clean since 2009  . Pancreatic ascites   . Cirrhosis ([place])   . Diabetes mellitus without complication ([place])     Diet Order:  Diet heart healthy/carb modified Room service appropriate?: Yes; Fluid consistency:: Thin    Current Nutrition: Recorded po intake 100% of breakfast per [facility] Nsg staff.  Pt on isolation. Per [facility] pt with decreased appetite PTA.    Medications: Creon, Protonix Labs: reviewed, Na and K WDL this am.    Gastrointestinal Profile: Last BM:  10/23/2015 multiple watery stools   Nutrition-Focused Physical Exam Findings:  Unable to complete Nutrition-Focused  physical exam at this time.    Weight Change: Per [facility] weight loss PTA. Per [facility] weight encounters pt possibly with 12% weight loss in one month.   Skin:  Reviewed, no issues   Height:   Ht Readings from Last 1 Encounters:  10/21/15 5\' 8"  (1.727 m)    Weight:   Wt Readings from Last 1 Encounters:  10/21/15 231 lb (104.781 kg)   Wt Readings from Last 10 Encounters:  10/21/15 231 lb (104.781 kg)  10/20/15 262 lb (118.842 kg)  09/15/15 262 lb 12.6 oz (119.2 kg)  08/16/15 260 lb 2.3 oz (118 kg)  08/16/15 261 lb (118.389 kg)  06/22/15 251 lb 8 oz (114.08 kg)  06/16/15 262 lb (118.842 kg)  05/21/15 260 lb (117.935 kg)  05/16/15 262 lb 3.2 oz (118.933 kg)  04/17/15 251 lb (113.853 kg)    Ideal Body Weight:   64kg  BMI:  Body mass index is 35.13 kg/(m^2).  Estimated Nutritional Needs:   Kcal:  1675-1975kcals  Protein:  51-64g protein  Fluid:  >1.8L fluid  EDUCATION NEEDS:   No education needs identified at this time  Dwyane Luo, RD, LDN Pager [phone] Weekend/On-Call Pager [phone]

## 2015-10-23 NOTE — Progress Notes (Signed)
Called Dr Lavetta Nielsen regarding the test results for d-dimer.  He said Thanks

## 2015-10-23 NOTE — Progress Notes (Signed)
[place] at [place] NAME: [name]    MRN#:  [id number]  DATE OF BIRTH:  [birth year]  SUBJECTIVE:  Hospital Day: 1 day [name] is a 60 y.o. female presenting with Code Sepsis .   Overnight events: no overnight events, temperature afebrile Interval Events:  Much more alert, back to baseline complaints of left chest/left upper quadrant abdominal pain pleuritic in nature with associated cough  REVIEW OF SYSTEMS:  CONSTITUTIONAL: No fever, fatigue or weakness.  EYES: No blurred or double vision.  EARS, NOSE, AND THROAT: No tinnitus or ear pain.  RESPIRATORY:Positive cough, denies shortness of breath, wheezing or hemoptysis.  CARDIOVASCULAR: No chest pain, orthopnea, edema.  GASTROINTESTINAL: No nausea, vomiting, diarrhea or abdominal pain.  GENITOURINARY: No dysuria, hematuria.  ENDOCRINE: No polyuria, nocturia,  HEMATOLOGY: No anemia, easy bruising or bleeding SKIN: No rash or lesion. MUSCULOSKELETAL: No joint pain or arthritis.   NEUROLOGIC: No tingling, numbness, weakness.  PSYCHIATRY: No anxiety or depression.   DRUG ALLERGIES:   Allergies  Allergen Reactions  . Ibuprofen Other (See Comments)    Pt states that she is unable to take this medication because of her liver.    . Sulfa Antibiotics Hives  . Amoxicillin Itching and Rash    Has patient had a PCN reaction causing immediate rash, facial/tongue/throat swelling, SOB or lightheadedness with hypotension: No Has patient had a PCN reaction causing severe rash involving mucus membranes or skin necrosis: No Has patient had a PCN reaction that required hospitalization No Has patient had a PCN reaction occurring within the last 10 years: Yes If all of the above answers are "NO", then may proceed with Cephalosporin use.  . Chocolate Rash  . Ciprofloxacin Itching and Rash  . Dilaudid [Hydromorphone Hcl] Itching  . Fentanyl Rash  . Hydromorphone Itching  . Metformin  Nausea And Vomiting  . Penicillins Itching, Rash and Other (See Comments)    Has patient had a PCN reaction causing immediate rash, facial/tongue/throat swelling, SOB or lightheadedness with hypotension: No Has patient had a PCN reaction causing severe rash involving mucus membranes or skin necrosis: No Has patient had a PCN reaction that required hospitalization No Has patient had a PCN reaction occurring within the last 10 years: Yes If all of the above answers are "NO", then may proceed with Cephalosporin use.  . Strawberry Extract Rash  . Zofran [Ondansetron Hcl] Itching and Nausea And Vomiting    VITALS:  Blood pressure 104/82, pulse 83, temperature 98.8 F (37.1 C), temperature source Oral, resp. rate 18, height 5\' 8"  (1.727 m), weight 104.781 kg (231 lb), SpO2 97 %.  PHYSICAL EXAMINATION:  VITAL SIGNS: Filed Vitals:   10/23/15 0435 10/23/15 0857  BP: 108/70 104/82  Pulse: 89 83  Temp: 98.8 F (37.1 C) 98.8 F (37.1 C)  Resp: 18 7   GENERAL:59 y.o.female currently in no acute distress.  HEAD: Normocephalic, atraumatic.  EYES: Pupils equal, round, reactive to light. Extraocular muscles intact. No scleral icterus.  MOUTH: Moist mucosal membrane. Dentition intact. No abscess noted.  EAR, NOSE, THROAT: Clear without exudates. No external lesions.  NECK: Supple. No thyromegaly. No nodules. No JVD.  PULMONARY: Clear to ascultation, without wheeze rails or rhonci. No use of accessory muscles, Good respiratory effort. good air entry bilaterally CHEST: Nontender to palpation.  CARDIOVASCULAR: S1 and S2. Regular rate and rhythm. No murmurs, rubs, or gallops. No edema. Pedal pulses 2+ bilaterally.  GASTROINTESTINAL: Soft, nontender, nondistended. No masses.  Positive bowel sounds. No hepatosplenomegaly.  MUSCULOSKELETAL: No swelling, clubbing, or edema. Range of motion full in all extremities.  NEUROLOGIC: Cranial nerves II through XII are intact. No gross focal neurological deficits.  Sensation intact. Reflexes intact.  SKIN: No ulceration, lesions, rashes, or cyanosis. Skin warm and dry. Turgor intact.  PSYCHIATRIC: Mood, affect within normal limits. The patient is sleeping but arousable. Insight, judgment poor at this time.      LABORATORY PANEL:   CBC  Recent Labs Lab 10/23/15 0414  WBC 6.6  HGB 7.7*  HCT 24.7*  PLT 130*   ------------------------------------------------------------------------------------------------------------------  Chemistries   Recent Labs Lab 10/22/15 0514 10/23/15 0414  NA 133* 136  K 3.6 4.2  CL 109 113*  CO2 21* 19*  GLUCOSE 97 80  BUN <5* 5*  CREATININE 0.64 0.54  CALCIUM 7.4* 8.3*  AST 79*  --   ALT 21  --   ALKPHOS 176*  --   BILITOT 1.5*  --    ------------------------------------------------------------------------------------------------------------------  Cardiac Enzymes  Recent Labs Lab 10/20/15 1600  TROPONINI <0.03   ------------------------------------------------------------------------------------------------------------------  RADIOLOGY:  Dg Chest 2 View  10/22/2015  CLINICAL DATA:  Cough and fever today. EXAM: CHEST  2 VIEW COMPARISON:  10/20/2015 FINDINGS: Linear atelectatic appearing opacities are present in the right base. There is no confluent airspace consolidation. There is no effusion. Hilar, mediastinal and cardiac contours are unremarkable and unchanged. IMPRESSION: Linear opacities in the right base. These may be atelectatic. No confluent consolidation. No effusions. Electronically Signed   By: Andreas Newport M.D.   On: 10/22/2015 01:00   Dg Chest Port 1 View  10/22/2015  CLINICAL DATA:  Sepsis EXAM: PORTABLE CHEST 1 VIEW COMPARISON:  10/21/2015 FINDINGS: Persistent linear right base opacities. Left lung is clear. Pulmonary vasculature is normal. No large effusions. IMPRESSION: Unchanged linear right base opacities, likely atelectatic. Electronically Signed   By: Andreas Newport  M.D.   On: 10/22/2015 06:16    EKG:   Orders placed or performed during the hospital encounter of 10/21/15  . ED EKG 12-Lead  . ED EKG 12-Lead  . EKG 12-Lead  . EKG 12-Lead    ASSESSMENT AND PLAN:   [name] is a 60 y.o. female presenting with Code Sepsis . Admitted 10/21/2015 : Day #: 1 day  1. Sepsis, meeting septic criteria by temperature/heart rate present on arrival. Source UTI. Coag negative staph bacteremia, kleb uti -- ceftriaxone for both 2. Pleuritic chest pain -checked ddimer marked elevation will check ct pe 2. GERD without esophagitis: PPI therapy 3. Hyperlipidemia unspecified statin therapy 4. Seizure disorder Keppra 5. Venous thromboembolism prophylactic: Lovenox   All the records are reviewed and case discussed with Care Management/Social Workerr. Management plans discussed with the patient, family and they are in agreement.  CODE STATUS: full TOTAL TIME TAKING CARE OF THIS PATIENT: 33 minutes.   POSSIBLE D/C IN 1-2DAYS, DEPENDING ON CLINICAL CONDITION.   Kareem Aul,  Karenann Cai.D on 10/23/2015 at 1:49 PM  Between 7am to 6pm - Pager - [phone]  After 6pm: House Pager: - [facility]  Office  [phone]  CC: Primary care physician; Cletis Athens, MD

## 2015-10-24 ENCOUNTER — Inpatient Hospital Stay: Payer: Medicare Other

## 2015-10-24 LAB — MRSA PCR SCREENING: MRSA by PCR: POSITIVE — AB

## 2015-10-24 MED ORDER — TECHNETIUM TC 99M DIETHYLENETRIAME-PENTAACETIC ACID
31.6300 | Freq: Once | INTRAVENOUS | Status: AC | PRN
  Administered 2015-10-24: 31.63 via INTRAVENOUS

## 2015-10-24 MED ORDER — ZOLPIDEM TARTRATE 5 MG PO TABS
5.0000 mg | ORAL_TABLET | Freq: Every day | ORAL | Status: DC
  Administered 2015-10-24: 5 mg via ORAL
  Filled 2015-10-24: qty 1

## 2015-10-24 MED ORDER — TECHNETIUM TO 99M ALBUMIN AGGREGATED
4.1000 | Freq: Once | INTRAVENOUS | Status: AC | PRN
  Administered 2015-10-24: 4.1 via INTRAVENOUS

## 2015-10-24 NOTE — Plan of Care (Addendum)
Confirmed withj Dr. Parks Ranger POC is to still have VQ scan.  20 IV sucessfully placed in Rt upper arm and LM to inform nuclear med thjat pt is ready for scan.  Also confirmed with Dr. Madaline Brilliant to run NS KVO to prevent IV from clotting off prior to proc.   Chest Xray also ordered and completed, prior to needed VQscan.

## 2015-10-24 NOTE — Progress Notes (Signed)
[place] at [place] NAME: [name]    MRN#:  [id number]  DATE OF BIRTH:  [birth year]  SUBJECTIVE:  Hospital Day: 2 days [name] is a 60 y.o. female presenting with Code Sepsis .   Overnight events: no overnight events, temperature afebrile Interval Events:  Much more alert, back to baseline complaints of left chest/left upper quadrant abdominal pain pleuritic in nature with associated cough   REVIEW OF SYSTEMS:  CONSTITUTIONAL: No fever, fatigue or weakness.  EYES: No blurred or double vision.  EARS, NOSE, AND THROAT: No tinnitus or ear pain.  RESPIRATORY:Positive cough, denies shortness of breath, wheezing or hemoptysis.  CARDIOVASCULAR: No chest pain, orthopnea, edema.  GASTROINTESTINAL: No nausea, vomiting, diarrhea , have left upper abdominal pain.  GENITOURINARY: No dysuria, hematuria.  ENDOCRINE: No polyuria, nocturia,  HEMATOLOGY: No anemia, easy bruising or bleeding SKIN: No rash or lesion. MUSCULOSKELETAL: No joint pain or arthritis.   NEUROLOGIC: No tingling, numbness, weakness.  PSYCHIATRY: No anxiety or depression.   DRUG ALLERGIES:   Allergies  Allergen Reactions  . Ibuprofen Other (See Comments)    Pt states that she is unable to take this medication because of her liver.    . Sulfa Antibiotics Hives  . Amoxicillin Itching and Rash    Has patient had a PCN reaction causing immediate rash, facial/tongue/throat swelling, SOB or lightheadedness with hypotension: No Has patient had a PCN reaction causing severe rash involving mucus membranes or skin necrosis: No Has patient had a PCN reaction that required hospitalization No Has patient had a PCN reaction occurring within the last 10 years: Yes If all of the above answers are "NO", then may proceed with Cephalosporin use.  . Chocolate Rash  . Ciprofloxacin Itching and Rash  . Dilaudid [Hydromorphone Hcl] Itching  . Fentanyl Rash  . Hydromorphone Itching   . Metformin Nausea And Vomiting  . Penicillins Itching, Rash and Other (See Comments)    Has patient had a PCN reaction causing immediate rash, facial/tongue/throat swelling, SOB or lightheadedness with hypotension: No Has patient had a PCN reaction causing severe rash involving mucus membranes or skin necrosis: No Has patient had a PCN reaction that required hospitalization No Has patient had a PCN reaction occurring within the last 10 years: Yes If all of the above answers are "NO", then may proceed with Cephalosporin use.  . Strawberry Extract Rash  . Zofran [Ondansetron Hcl] Itching and Nausea And Vomiting    VITALS:  Blood pressure 112/62, pulse 82, temperature 99.4 F (37.4 C), temperature source Oral, resp. rate 22, height 5\' 8"  (1.727 m), weight 121.927 kg (268 lb 12.8 oz), SpO2 96 %.  PHYSICAL EXAMINATION:  VITAL SIGNS: Filed Vitals:   10/24/15 0841 10/24/15 1633  BP: 122/57 112/62  Pulse: 82 82  Temp:  99.4 F (37.4 C)  Resp:  28   GENERAL:59 y.o.female currently in no acute distress.  HEAD: Normocephalic, atraumatic.  EYES: Pupils equal, round, reactive to light. Extraocular muscles intact. No scleral icterus.  MOUTH: Moist mucosal membrane. Dentition intact. No abscess noted.  EAR, NOSE, THROAT: Clear without exudates. No external lesions.  NECK: Supple. No thyromegaly. No nodules. No JVD.  PULMONARY: Clear to ascultation, without wheeze rails or rhonci. No use of accessory muscles, Good respiratory effort. good air entry bilaterally CHEST: Nontender to palpation.  CARDIOVASCULAR: S1 and S2. Regular rate and rhythm. No murmurs, rubs, or gallops. No edema. Pedal pulses 2+ bilaterally.  GASTROINTESTINAL: Soft, Left  upper quadrant tender, nondistended. No masses. Positive bowel sounds. No hepatosplenomegaly.  MUSCULOSKELETAL: No swelling, clubbing, or edema. Range of motion full in all extremities.  NEUROLOGIC: Cranial nerves II through XII are intact. No gross focal  neurological deficits. Sensation intact. Reflexes intact.  SKIN: No ulceration, lesions, rashes, or cyanosis. Skin warm and dry. Turgor intact.  PSYCHIATRIC: Mood, affect within normal limits. The patient is sleeping but arousable. Insight, judgment poor at this time.      LABORATORY PANEL:   CBC  Recent Labs Lab 10/23/15 0414  WBC 6.6  HGB 7.7*  HCT 24.7*  PLT 130*   ------------------------------------------------------------------------------------------------------------------  Chemistries   Recent Labs Lab 10/22/15 0514 10/23/15 0414  NA 133* 136  K 3.6 4.2  CL 109 113*  CO2 21* 19*  GLUCOSE 97 80  BUN <5* 5*  CREATININE 0.64 0.54  CALCIUM 7.4* 8.3*  AST 79*  --   ALT 21  --   ALKPHOS 176*  --   BILITOT 1.5*  --    ------------------------------------------------------------------------------------------------------------------  Cardiac Enzymes  Recent Labs Lab 10/20/15 1600  TROPONINI <0.03   ------------------------------------------------------------------------------------------------------------------  RADIOLOGY:  Dg Chest 1 View  10/24/2015  CLINICAL DATA:  Chest pain.  Code sepsis.  Initial encounter. EXAM: CHEST 1 VIEW COMPARISON:  Single view of the chest 10/22/2015. PA and lateral chest 10/21/2015. FINDINGS: The lungs are clear. Heart size is normal. No pneumothorax or pleural effusion. No focal bony abnormality. IMPRESSION: Negative chest. Electronically Signed   By: Inge Rise M.D.   On: 10/24/2015 13:06   Nm Pulmonary Perf And Vent  10/24/2015  CLINICAL DATA:  Shortness of breath, remote history pulmonary embolism 15 years ago, emphysema, chronic bronchitis, cirrhosis, hypertension, diabetes mellitus, smoker EXAM: NUCLEAR MEDICINE VENTILATION - PERFUSION LUNG SCAN TECHNIQUE: Ventilation images were obtained in multiple projections using inhaled aerosol Tc-43m DTPA. Perfusion images were obtained in multiple projections after intravenous  injection of Tc-10m MAA. Lateral images were not obtained on either the ventilation or perfusion exams. RADIOPHARMACEUTICALS:  31.63 mCi Technetium-62m DTPA aerosol inhalation and 4.1 mCi Technetium-78m MAA IV COMPARISON:  None Radiographic correlation:  Chest radiograph 10/24/2015 FINDINGS: Ventilation: Extensive central airway deposition of tracer. No definite focal ventilatory defects. Perfusion: No segmental or subsegmental perfusion defects. Chest radiograph:  No significant abnormalities. IMPRESSION: Normal ventilation and perfusion lung scan. Electronically Signed   By: Lavonia Dana M.D.   On: 10/24/2015 15:50    EKG:   Orders placed or performed during the hospital encounter of 10/21/15  . ED EKG 12-Lead  . ED EKG 12-Lead  . EKG 12-Lead  . EKG 12-Lead    ASSESSMENT AND PLAN:   [name] is a 60 y.o. female presenting with Code Sepsis . Admitted 10/21/2015 : Day #: 2 days  1. Sepsis, meeting septic criteria by temperature/heart rate present on arrival. Source UTI. Coag negative staph bacteremia, kleb uti -- ceftriaxone for both, repeat bl cx negative. Will give total 10 days Abx. 2. Pleuritic chest pain -checked ddimer marked elevation     Awaited V Q scan result today. No signs of PNA on Xray. 2. GERD without esophagitis: PPI therapy 3. Hyperlipidemia unspecified statin therapy 4. Seizure disorder Keppra 5. Venous thromboembolism prophylactic: Lovenox   All the records are reviewed and case discussed with Care Management/Social Workerr. Management plans discussed with the patient, family and they are in agreement.  CODE STATUS: full TOTAL TIME TAKING CARE OF THIS PATIENT: 33 minutes.   POSSIBLE D/C IN 1-2DAYS, DEPENDING ON  CLINICAL CONDITION.   [name] M.D on 10/24/2015 at 5:15 PM  Between 7am to 6pm - Pager - [phone]  After 6pm: House Pager: - [phone]  [facility]  Office  [phone]  CC: Primary care physician;  Cletis Athens, MD

## 2015-10-25 ENCOUNTER — Inpatient Hospital Stay: Payer: Medicare Other

## 2015-10-25 ENCOUNTER — Other Ambulatory Visit: Payer: Self-pay | Admitting: *Deleted

## 2015-10-25 LAB — CULTURE, BLOOD (ROUTINE X 2)

## 2015-10-25 LAB — BASIC METABOLIC PANEL
ANION GAP: 8 (ref 5–15)
BUN: 6 mg/dL (ref 6–20)
CHLORIDE: 105 mmol/L (ref 101–111)
CO2: 20 mmol/L — AB (ref 22–32)
Calcium: 8.1 mg/dL — ABNORMAL LOW (ref 8.9–10.3)
Creatinine, Ser: 0.66 mg/dL (ref 0.44–1.00)
GFR calc non Af Amer: >60 mL/min (ref >60)
Glucose, Bld: 75 mg/dL (ref 65–99)
POTASSIUM: 3.5 mmol/L (ref 3.5–5.1)
SODIUM: 133 mmol/L — AB (ref 135–145)

## 2015-10-25 LAB — CBC
HEMATOCRIT: 23.1 % — AB (ref 35.0–47.0)
HEMOGLOBIN: 7.2 g/dL — AB (ref 12.0–16.0)
MCH: 22.3 pg — ABNORMAL LOW (ref 26.0–34.0)
MCHC: 31.4 g/dL — AB (ref 32.0–36.0)
MCV: 71.1 fL — ABNORMAL LOW (ref 80.0–100.0)
Platelets: 157 10*3/uL (ref 150–440)
RBC: 3.25 MIL/uL — AB (ref 3.80–5.20)
RDW: 23.6 % — ABNORMAL HIGH (ref 11.5–14.5)
WBC: 6 10*3/uL (ref 3.6–11.0)

## 2015-10-25 LAB — URINE CULTURE: Culture: 40000 — AB

## 2015-10-25 MED ORDER — SPIRONOLACTONE 50 MG PO TABS: 50.0000 mg | ORAL_TABLET | Freq: Two times a day (BID) | ORAL | Status: DC

## 2015-10-25 MED ORDER — ALPRAZOLAM 1 MG PO TABS: 1.0000 mg | ORAL_TABLET | Freq: Three times a day (TID) | ORAL | Status: DC | PRN

## 2015-10-25 MED ORDER — CEPHALEXIN 500 MG PO CAPS: 500.0000 mg | ORAL_CAPSULE | Freq: Three times a day (TID) | ORAL | Status: DC

## 2015-10-25 MED ORDER — OXYCODONE HCL ER 20 MG PO T12A: 20.0000 mg | EXTENDED_RELEASE_TABLET | Freq: Two times a day (BID) | ORAL | Status: DC

## 2015-10-25 MED ORDER — OXYCODONE HCL 5 MG PO TABS: 5.0000 mg | ORAL_TABLET | Freq: Four times a day (QID) | ORAL | Status: DC | PRN

## 2015-10-25 MED ORDER — OXYCODONE HCL ER 20 MG PO T12A
20.0000 mg | EXTENDED_RELEASE_TABLET | Freq: Two times a day (BID) | ORAL | Status: DC
Start: 2015-10-25 — End: 2015-11-16

## 2015-10-25 MED ORDER — DIPHENHYDRAMINE HCL 25 MG PO CAPS: 25.0000 mg | ORAL_CAPSULE | Freq: Three times a day (TID) | ORAL | Status: AC | PRN

## 2015-10-25 NOTE — Care Management Important Message (Signed)
Important Message  Patient Details  Name: [name] MRN: [id number] Date of Birth: Oct 14, 1955   Medicare Important Message Given:  Yes    Marshell Garfinkel, RN 10/25/2015, 9:22 AM

## 2015-10-25 NOTE — Discharge Instructions (Signed)
Follow with PCP in 2-3 weeks to check spleen- may need follow up sonogram.

## 2015-10-25 NOTE — Care Management (Addendum)
Spoke with patient about home health. She has a PCP (Dr. Lavera Guise) and apparently owes him money so she wanted another PCP. I have worked with this patient multiple times and arranged PCP twice ([facility] and [facility]) for this patient. [facility] is now speaking with patient. Patient refused any other home health agency expect [facility]- I have notified Corene Cornea to see if [facility] can take her back. Per [facility] cannot accept patient [facility] due to noncompliance non-adherence to nursing recommendation.  [facility] will follow patient. MD updated. Case closed.

## 2015-10-25 NOTE — Evaluation (Signed)
Physical Therapy Evaluation Patient Details Name: [name] MRN: [id number] DOB: 10-04-1955 Today's Date: 10/25/2015   History of Present Illness  60 yo F presented to ED for low back pain and burining urination found to have a UTI and sepsis. She is 2 weeks post of craniotomy due to a subdural hematoma. PMH includes malignant brain tumor, seizures, stroke, alcohol/drug abuse.   Clinical Impression  Pt demonstrated generalized weakness and difficulty walking with low activity tolerance. At her baseline she is very sedentary and has an aide that comes 7 days per week. She required min guard for bed mobility, transfers and ambulation up to 30 ft with FWW. She fatigues quickly requiring seated rest breaks for energy conservation. PT recommended to continue to use FWW. HHPT recommended to address deficits of activity tolerance and increase functional independence to PLOF. Pt will benefit from skilled PT services to increase functional I and mobility for safe discharge.     Follow Up Recommendations Home health PT;Supervision for mobility/OOB    Equipment Recommendations  None recommended by PT (has recommended FWW)    Recommendations for Other Services       Precautions / Restrictions Precautions Precautions: Fall Restrictions Weight Bearing Restrictions: No      Mobility  Bed Mobility Overal bed mobility: Needs Assistance Bed Mobility: Supine to Sit;Sit to Supine     Supine to sit: Min guard Sit to supine: Min guard      Transfers Overall transfer level: Needs assistance Equipment used: Rolling walker (2 wheeled) Transfers: Sit to/from [facility] Sit to Stand: Min guard Stand pivot transfers: Min guard       General transfer comment: cues for hand placement and safety  Ambulation/Gait Ambulation/Gait assistance: Supervision Ambulation Distance (Feet): 30 Feet Assistive device: Rolling walker (2 wheeled) Gait Pattern/deviations: Decreased stride  length;Trunk flexed Gait velocity: reduced   General Gait Details: Demonstrated slow but steady gait with no LOB. Cues for staying inside [place]            Wheelchair Mobility    Modified Rankin (Stroke Patients Only)       Balance Overall balance assessment: History of Falls;Needs assistance Sitting-balance support: No upper extremity supported Sitting balance-Leahy Scale: Good     Standing balance support: No upper extremity supported Standing balance-Leahy Scale: Fair Standing balance comment: steady with no LOB with reaching out of BOS                             Pertinent Vitals/Pain Pain Assessment: 0-10 Pain Score: 9  Pain Location: head Pain Descriptors / Indicators: Aching Pain Intervention(s): Limited activity within patient's tolerance;Monitored during session    [place] expects to be discharged to:: Private residence Living Arrangements: Spouse/significant other Available Help at Discharge: Family;Personal care attendant;Available 24 hours/day Type of Home: House Home Access: Level entry     Home Layout: One level Home Equipment: Walker - 2 wheels Additional Comments: Pt is a poor historian. Between PT eval and chart history it is unclear who pt lives with or what her home situation is like currently.    Prior Function Level of Independence: Needs assistance   Gait / Transfers Assistance Needed: very limited ambulation with FWW in house. Often doesn't use it resulting in falls.  ADL's / Homemaking Assistance Needed: Aide assists with bathing, dressing, and IADLs.  Comments: Unsure of accuracy of home living and PLOF information - pt is poor historian  Hand Dominance        Extremity/Trunk Assessment   Upper Extremity Assessment: Overall WFL for tasks assessed           Lower Extremity Assessment: Overall WFL for tasks assessed         Communication   Communication: No difficulties   Cognition Arousal/Alertness: Awake/alert Behavior During Therapy: Impulsive Overall Cognitive Status: Difficult to assess (commented on the "termites and roaches on the floor")                      General Comments      Exercises Other Exercises Other Exercises: Pt ambulated 35ft x2 and 30 ft with FWW and min guard with steady but slow gait. Cues for staying inside [place].Fatigues quickly and requires seated rest breaks for energy conservation.      Assessment/Plan    PT Assessment Patient needs continued PT services  PT Diagnosis Difficulty walking;Generalized weakness   PT Problem List Decreased strength;Decreased activity tolerance;Decreased balance;Decreased mobility;Decreased safety awareness;Obesity  PT Treatment Interventions Gait training;Therapeutic activities;Therapeutic exercise;Balance training;Neuromuscular re-education;Patient/family education   PT Goals (Current goals can be found in the Care Plan section) Acute Rehab PT Goals Patient Stated Goal: to go home PT Goal Formulation: With patient Time For Goal Achievement: 11/08/15 Potential to Achieve Goals: Fair    Frequency Min 2X/week   Barriers to discharge   none    Co-evaluation               End of Session Equipment Utilized During Treatment: Gait belt Activity Tolerance: Patient limited by fatigue Patient left: in bed;with call bell/phone within reach;with bed alarm set;with family/visitor present Nurse Communication: Mobility status         Time: 1400-1427 PT Time Calculation (min) (ACUTE ONLY): 27 min   Charges:   PT Evaluation $PT Eval Moderate Complexity: 1 Procedure PT Treatments $Gait Training: 8-22 mins   PT G Codes:        Neoma Laming, PT, DPT  10/25/2015, 2:51 PM [phone]

## 2015-10-25 NOTE — Progress Notes (Signed)
   10/25/15 1543  Clinical Encounter Type  Visited With Patient;Family  Visit Type Follow-up  Consult/Referral To Chaplain  Chaplain visited with patient and mother. Chaplain offered and provided pastoral support.   [place] [phone]

## 2015-10-25 NOTE — Consult Note (Signed)
   [facility] CM Inpatient Consult   10/25/2015  [name] August 03, 1955 [id number]   Referral received from inpatient case manager, diagnosis of DM, and CKD for post hospital discharge follow up. Patient was evaluated for community based chronic disease management services with [facility] care Management Program as a benefit of patient's [employer]. Met with the patient and her mother [name] at the bedside to explain [place] Management services. Patient endorses her primary care provider to be Dr.J. Lavera Guise, but states she has not been following up with him related to a balance on her account. Patient states Dr. Lavera Guise is listed on her insurance card as her primary care MD. Consent form signed. Patient will receive post hospital discharge calls and be evaluated for monthly home visits. Patient states her best contact number is [phone] and gives permission to speak with her mother [name] at [phone] or [phone]. [facility] Care Management services does not interfere with or replace any services arranged by the inpatient care management team. RNCM left contact information and [facility] literature at the bedside. Made inpatient RNCM aware that [facility] will be following for care management. For additional questions please contact:   Malonie Tatum RN, [facility] Liaison  [phone]) Business Mobile [phone]) Toll free office

## 2015-10-25 NOTE — Progress Notes (Signed)
   10/25/15 1100  Clinical Encounter Type  Visited With Patient  Visit Type Follow-up  Consult/Referral To Chaplain  Chaplain attempted to follow up with patient but she was asleep. Will retry later today.   [place] [phone]

## 2015-10-26 ENCOUNTER — Ambulatory Visit: Payer: Medicare Other | Admitting: Obstetrics and Gynecology

## 2015-10-26 LAB — CULTURE, RESPIRATORY

## 2015-10-26 LAB — CULTURE, BLOOD (ROUTINE X 2)
Culture: NO GROWTH
Culture: NO GROWTH

## 2015-10-26 LAB — CULTURE, RESPIRATORY W GRAM STAIN

## 2015-10-26 NOTE — Progress Notes (Signed)
Spoke with Dr. Anselm Jungling about Ucx, Bcx, and sputum cx results. States pt urine symptoms had subsided and no respiratory issues. No further tx needed per dr.  Ramond Dial, Pharm.D Clinical Pharmacist

## 2015-10-27 NOTE — Discharge Summary (Signed)
[facility] [place] NAME: [name]    MR#:  [id number]  DATE OF BIRTH:  11-Aug-1955  DATE OF ADMISSION:  10/21/2015 ADMITTING PHYSICIAN: Saundra Shelling, MD  DATE OF DISCHARGE: 10/25/2015  3:52 PM  PRIMARY CARE PHYSICIAN: MASOUD,JAVED, MD    ADMISSION DIAGNOSIS:  UTI (lower urinary tract infection) [N39.0] Sepsis ([place]) [A41.9] Sepsis, due to unspecified organism ([place]) [A41.9]  DISCHARGE DIAGNOSIS:  Principal Problem:   UTI (lower urinary tract infection)   Splenomegaly  SECONDARY DIAGNOSIS:   Past Medical History  Diagnosis Date  . Emphysema of lung ([place])   . Depression   . Diverticulitis   . Chronic bronchitis ([place])   . Malignant brain tumor ([place]) 2007  . GERD (gastroesophageal reflux disease)   . Allergy   . Hepatitis C   . Hypertension   . Chronic kidney disease   . Migraines   . Urine incontinence   . Seizures ([place])   . Stroke [facility]) 2007    during brain surgery  . Pancreatitis, alcoholic 0000000  . Rectovaginal fistula   . H/O ETOH abuse     Sober since 2009  . H/O drug abuse     Clean since 2009  . Pancreatic ascites   . Cirrhosis ([place])   . Diabetes mellitus without complication [facility])     HOSPITAL COURSE:   1. Sepsis, meeting septic criteria by temperature/heart rate present on arrival. Source UTI. Coag negative staph bacteremia, kleb uti -- ceftriaxone for both, repeat bl cx negative. Will give total 10 days Abx. 2. Pleuritic chest pain -checked ddimer marked elevation   negative V Q scan result today. No signs of PNA on Xray.   [place] and CT abd- suggest splenomegaly- with liver cirrhosis.   Started on Spironolactone, and given pain meds, advised to follow with GI in 2 weeks. 2. GERD without esophagitis: PPI therapy 3. Hyperlipidemia unspecified statin therapy 4. Seizure disorder Keppra 5. Venous thromboembolism prophylactic: Lovenox  DISCHARGE CONDITIONS:   Stable.  CONSULTS OBTAINED:      DRUG ALLERGIES:   Allergies  Allergen Reactions  . Ibuprofen Other (See Comments)    Pt states that she is unable to take this medication because of her liver.    . Sulfa Antibiotics Hives  . Amoxicillin Itching and Rash    Has patient had a PCN reaction causing immediate rash, facial/tongue/throat swelling, SOB or lightheadedness with hypotension: No Has patient had a PCN reaction causing severe rash involving mucus membranes or skin necrosis: No Has patient had a PCN reaction that required hospitalization No Has patient had a PCN reaction occurring within the last 10 years: Yes If all of the above answers are "NO", then may proceed with Cephalosporin use.  . Chocolate Rash  . Ciprofloxacin Itching and Rash  . Dilaudid [Hydromorphone Hcl] Itching  . Fentanyl Rash  . Hydromorphone Itching  . Metformin Nausea And Vomiting  . Penicillins Itching, Rash and Other (See Comments)    Has patient had a PCN reaction causing immediate rash, facial/tongue/throat swelling, SOB or lightheadedness with hypotension: No Has patient had a PCN reaction causing severe rash involving mucus membranes or skin necrosis: No Has patient had a PCN reaction that required hospitalization No Has patient had a PCN reaction occurring within the last 10 years: Yes If all of the above answers are "NO", then may proceed with Cephalosporin use.  . Strawberry Extract Rash  . Zofran [Ondansetron Hcl] Itching and Nausea And Vomiting  DISCHARGE MEDICATIONS:   Discharge Medication List as of 10/25/2015  3:37 PM    START taking these medications   Details  diphenhydrAMINE (BENADRYL) 25 mg capsule Take 1 capsule (25 mg total) by mouth every 8 (eight) hours as needed for itching., Starting 10/25/2015, Until Discontinued, Print      CONTINUE these medications which have CHANGED   Details  ALPRAZolam (XANAX) 1 MG tablet Take 1 tablet (1 mg total) by mouth 3 (three) times daily as needed for anxiety., Starting  10/25/2015, Until Discontinued, Print    cephALEXin (KEFLEX) 500 MG capsule Take 1 capsule (500 mg total) by mouth 3 (three) times daily., Starting 10/25/2015, Until Discontinued, Print    oxyCODONE (OXY IR/ROXICODONE) 5 MG immediate release tablet Take 1 tablet (5 mg total) by mouth every 6 (six) hours as needed for moderate pain or severe pain., Starting 10/25/2015, Until Discontinued, Print    oxyCODONE (OXYCONTIN) 20 mg 12 hr tablet Take 1 tablet (20 mg total) by mouth every 12 (twelve) hours., Starting 10/25/2015, Until Discontinued, Print    spironolactone (ALDACTONE) 50 MG tablet Take 1 tablet (50 mg total) by mouth 2 (two) times daily., Starting 10/25/2015, Until Discontinued, Print      CONTINUE these medications which have NOT CHANGED   Details  amitriptyline (ELAVIL) 50 MG tablet Take 1 tablet (50 mg total) by mouth at bedtime., Starting 12/01/2012, Until Discontinued, Normal    Fluticasone-Salmeterol (ADVAIR) 500-50 MCG/DOSE AEPB Inhale 1 puff into the lungs 2 (two) times daily., Until Discontinued, Historical Med    gabapentin (NEURONTIN) 300 MG capsule Take 300 mg by mouth 3 (three) times daily. , Until Discontinued, Historical Med    hydrocortisone cream 0.5 % Apply 1 application topically 2 (two) times daily as needed for itching., Starting 06/16/2015, Until Discontinued, Normal    Ipratropium-Albuterol (COMBIVENT) 20-100 MCG/ACT AERS respimat Inhale 2 puffs into the lungs every 6 (six) hours as needed for wheezing or shortness of breath. , Until Discontinued, Historical Med    levETIRAcetam (KEPPRA) 500 MG tablet Take 1 tablet (500 mg total) by mouth 2 (two) times daily., Starting 11/03/2012, Until Discontinued, Normal    metoprolol succinate (TOPROL-XL) 25 MG 24 hr tablet Take 1 tablet (25 mg total) by mouth daily., Starting 10/21/2012, Until Discontinued, Print    omeprazole (PRILOSEC) 20 MG capsule Take 1 capsule (20 mg total) by mouth daily., Starting 07/26/2014, Until  Discontinued, Normal    Pancrelipase, Lip-Prot-Amyl, (CREON) 24000 units CPEP Take 24,000 Units by mouth 3 (three) times daily with meals., Until Discontinued, Historical Med    pravastatin (PRAVACHOL) 10 MG tablet Take 10 mg by mouth daily. , Until Discontinued, Historical Med    promethazine (PHENERGAN) 25 MG tablet Take 25 mg by mouth every 8 (eight) hours as needed for nausea or vomiting., Until Discontinued, Historical Med    sertraline (ZOLOFT) 50 MG tablet Take 50 mg by mouth daily. , Until Discontinued, Historical Med    tiotropium (SPIRIVA) 18 MCG inhalation capsule Place 18 mcg into inhaler and inhale daily. , Until Discontinued, Historical Med    triamcinolone (NASACORT ALLERGY 24HR) 55 MCG/ACT AERO nasal inhaler Place 2 sprays into the nose daily as needed (for rhinitis)., Until Discontinued, Historical Med    zolpidem (AMBIEN) 10 MG tablet Take 1 tablet (10 mg total) by mouth at bedtime., Starting 10/21/2012, Until Discontinued, Print      STOP taking these medications     furosemide (LASIX) 20 MG tablet      terconazole (TERAZOL  7) 0.4 % vaginal cream      clindamycin (CLEOCIN) 300 MG capsule          DISCHARGE INSTRUCTIONS:    Follow with PMD in 2 weeks.  If you experience worsening of your admission symptoms, develop shortness of breath, life threatening emergency, suicidal or homicidal thoughts you must seek medical attention immediately by calling 911 or calling your MD immediately  if symptoms less severe.  You Must read complete instructions/literature along with all the possible adverse reactions/side effects for all the Medicines you take and that have been prescribed to you. Take any new Medicines after you have completely understood and accept all the possible adverse reactions/side effects.   Please note  You were cared for by a hospitalist during your hospital stay. If you have any questions about your discharge medications or the care you received  while you were in the hospital after you are discharged, you can call the unit and asked to speak with the hospitalist on call if the hospitalist that took care of you is not available. Once you are discharged, your primary care physician will handle any further medical issues. Please note that NO REFILLS for any discharge medications will be authorized once you are discharged, as it is imperative that you return to your primary care physician (or establish a relationship with a primary care physician if you do not have one) for your aftercare needs so that they can reassess your need for medications and monitor your lab values.    Today   CHIEF COMPLAINT:   Chief Complaint  Patient presents with  . Code Sepsis    HISTORY OF PRESENT ILLNESS:  [name]  is a 60 y.o. female with a known history of bronchitis, GERD, brain tumor hepatitis C, chronic kidney disease, seizure disorder, cirrhosis of liver presented to the emergency room because of low back pain and burning sensation when she passes urine. This is been going on for the last few days to a week. Patient has low back pain which is aching in nature 4 out of 10. She has fever and chills. Patient was worked up in the emergency room was found to have UTI with sepsis. Started on IV fluids and IV antibiotics. No history of any nausea or vomiting. No history of any abdominal pain. No history of headache dizziness blurry vision. Has generalized weakness.   VITAL SIGNS:  Blood pressure 116/74, pulse 84, temperature 97.8 F (36.6 C), temperature source Oral, resp. rate 22, height 5\' 8"  (1.727 m), weight 121.201 kg (267 lb 3.2 oz), SpO2 94 %.  I/O:  No intake or output data in the 24 hours ending 10/27/15 2209  PHYSICAL EXAMINATION:   GENERAL:59 y.o.female currently in no acute distress.  HEAD: Normocephalic, atraumatic.  EYES: Pupils equal, round, reactive to light. Extraocular muscles intact. No scleral icterus.  MOUTH: Moist mucosal  membrane. Dentition intact. No abscess noted.  EAR, NOSE, THROAT: Clear without exudates. No external lesions.  NECK: Supple. No thyromegaly. No nodules. No JVD.  PULMONARY: Clear to ascultation, without wheeze rails or rhonci. No use of accessory muscles, Good respiratory effort. good air entry bilaterally CHEST: Nontender to palpation.  CARDIOVASCULAR: S1 and S2. Regular rate and rhythm. No murmurs, rubs, or gallops. No edema. Pedal pulses 2+ bilaterally.  GASTROINTESTINAL: Soft, Left upper quadrant tender, nondistended. No masses. Positive bowel sounds. No hepatosplenomegaly.  MUSCULOSKELETAL: No swelling, clubbing, or edema. Range of motion full in all extremities.  NEUROLOGIC: Cranial nerves II through  XII are intact. No gross focal neurological deficits. Sensation intact. Reflexes intact.  SKIN: No ulceration, lesions, rashes, or cyanosis. Skin warm and dry. Turgor intact.  PSYCHIATRIC: Mood, affect within normal limits. The patient is sleeping but arousable. Insight, judgment poor at this time.   DATA REVIEW:   CBC  Recent Labs Lab 10/25/15 0706  WBC 6.0  HGB 7.2*  HCT 23.1*  PLT 157    Chemistries   Recent Labs Lab 10/22/15 0514  10/25/15 0706  NA 133*  < > 133*  K 3.6  < > 3.5  CL 109  < > 105  CO2 21*  < > 20*  GLUCOSE 97  < > 75  BUN <5*  < > 6  CREATININE 0.64  < > 0.66  CALCIUM 7.4*  < > 8.1*  AST 79*  --   --   ALT 21  --   --   ALKPHOS 176*  --   --   BILITOT 1.5*  --   --   < > = values in this interval not displayed.  Cardiac Enzymes No results for input(s): TROPONINI in the last 168 hours.  Microbiology Results  Results for orders placed or performed during the hospital encounter of 10/21/15  Blood Culture (routine x 2)     Status: None   Collection Time: 10/21/15 11:01 PM  Result Value Ref Range Status   Specimen Description BLOOD LEFT ASSIST CONTROL  Final   Special Requests BOTTLES DRAWN AEROBIC AND ANAEROBIC 3CCAERO,1CCANA  Final    Culture NO GROWTH 5 DAYS  Final   Report Status 10/26/2015 FINAL  Final  Blood Culture (routine x 2)     Status: None   Collection Time: 10/21/15 11:31 PM  Result Value Ref Range Status   Specimen Description BLOOD RIGHT HAND  Final   Special Requests BOTTLES DRAWN AEROBIC AND ANAEROBIC [place]  Final   Culture NO GROWTH 5 DAYS  Final   Report Status 10/26/2015 FINAL  Final  MRSA PCR Screening     Status: Abnormal   Collection Time: 10/24/15  5:04 AM  Result Value Ref Range Status   MRSA by PCR POSITIVE (A) NEGATIVE Final    Comment:        The GeneXpert MRSA Assay (FDA approved for NASAL specimens only), is one component of a comprehensive MRSA colonization surveillance program. It is not intended to diagnose MRSA infection nor to guide or monitor treatment for MRSA infections. CRITICAL RESULT CALLED TO, READ BACK BY AND VERIFIED WITH: JENNIFER COBLE AT [id number] ON 10/24/15.Marland KitchenMarland KitchenBuckhead Ridge     RADIOLOGY:  No results found.  EKG:   Orders placed or performed during the hospital encounter of 10/21/15  . ED EKG 12-Lead  . ED EKG 12-Lead  . EKG 12-Lead  . EKG 12-Lead      Management plans discussed with the patient, family and they are in agreement.  CODE STATUS:  Code Status History    Date Active Date Inactive Code Status Order ID Comments User Context   10/22/2015  4:26 AM 10/25/2015  6:52 PM Full Code [id number]  Saundra Shelling, MD ED   08/16/2015  9:36 PM 08/30/2015  6:11 PM Full Code [id number]  Ashok Pall, MD Inpatient   06/22/2015  1:45 AM 06/22/2015  6:09 PM Full Code [id number]  Lytle Butte, MD ED   05/16/2015 12:00 PM 05/16/2015  6:17 PM DNR [id number]  Max Sane, MD Inpatient   05/15/2015  1:10 AM 05/16/2015 12:00 PM Full  Code [id number]  Harrie Foreman, MD Inpatient   11/05/2014 12:35 PM 11/05/2014  3:55 PM Full Code [id number]  Penne Lash, RN Inpatient   11/05/2014 12:17 PM 11/05/2014 12:35 PM Full Code [id number]  Penne Lash, RN Inpatient      TOTAL TIME TAKING  CARE OF THIS PATIENT: 35 minutes.    Vaughan Basta M.D on 10/27/2015 at 10:09 PM  Between 7am to 6pm - Pager - [phone]  After 6pm go to [email or web address] - password [email or web address] [place] [facility]  Office  [phone]  CC: Primary care physician; Cletis Athens, MD   Note: This dictation was prepared with Dragon dictation along with smaller phrase technology. Any transcriptional errors that result from this process are unintentional.

## 2015-10-30 ENCOUNTER — Other Ambulatory Visit: Payer: Self-pay | Admitting: *Deleted

## 2015-10-30 NOTE — Patient Outreach (Signed)
Note -  F/u  on inpatient referral received 4/20.  This RN CM f/u with Melecia at Dr. Jennette Kettle office as pt voiced to [place]  wanted to start back seeing Dr. Lavera Guise but practice would not see because of a balance, pt seeing a primary care MD in [facility].  Melecia reports pt owes a lot of money, no effort to repay, in collections since 2013-14, has not seen MD since then, MD will not see her.   RN CM collaborated with [employer] social worker plus sent an in Copy to [facility] care Psychologist, prison and probation services).    Note- received in basket from [facility] care management assistant) pt not eligible for [facility] program- not f/u with a [facility] MD.     Zara Chess.   [place] Care Management  [phone]

## 2015-10-30 NOTE — Patient Outreach (Signed)
[facility]) Care Management  10/30/2015  [name] 1956-01-13 [id number]  Phone call to patient to follow up on referral for assistance with financial needs. Per patient she has [facility] and Medicaid and has a history of being seen by  Dr. Jennette Kettle office.  However due to having a large balance, is unable to be seen there.  Patient currently being seen by a doctor in [place].  It was confirmed that patient is eligible and has already been enrolled in the [employer] Medicare Advantage program.  Customer service number provided for this program. [phone].   Patient's case closed to [facility] due to ineligibility, however patient is eligible and has been enrolled in the Dual [employer] Medicare Advantage program.    [place], [place] Management [phone]

## 2015-10-31 ENCOUNTER — Telehealth: Payer: Self-pay | Admitting: Obstetrics and Gynecology

## 2015-10-31 NOTE — Telephone Encounter (Signed)
[name] had a stroke when she had surgery and she couldn't make her appt yesterday. She has some questions and asked for you to call her ASAP

## 2015-11-03 NOTE — Telephone Encounter (Signed)
Returned patient's phone call.  Patient with a myriad of complaints.   1) Patient notes that she had a stroke while in the hospital this week.  2) Notes that she was told that her spleen ruptured while in the hospital but states that she was still discharged home.  3) Reports something protruding from her rectal area.  Desires to know who is able to manage this for her.  4) Reports being told she had a cyst on one of her ovaries and would need a hysterectomy 5) Thinks that she has a UTI.  Has had urinary symptoms for the past 4 days (including while inpatient).  Notes that they checked her urine but did not find anything.  6) Reports still having thick green discharge (like pea soup) from her vaginal area.  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   Attempted to address as many of her concerns as possible.  Patient desires to be seen sometime next week as she just feels "really bad".  Will refer to front office staff to have an appointment scheduled.     Rubie Maid, MD [facility]

## 2015-11-06 ENCOUNTER — Encounter: Payer: Self-pay | Admitting: Emergency Medicine

## 2015-11-06 DIAGNOSIS — I129 Hypertensive chronic kidney disease with stage 1 through stage 4 chronic kidney disease, or unspecified chronic kidney disease: Secondary | ICD-10-CM | POA: Diagnosis not present

## 2015-11-06 DIAGNOSIS — N390 Urinary tract infection, site not specified: Secondary | ICD-10-CM | POA: Diagnosis not present

## 2015-11-06 DIAGNOSIS — Z79899 Other long term (current) drug therapy: Secondary | ICD-10-CM | POA: Insufficient documentation

## 2015-11-06 DIAGNOSIS — Z8673 Personal history of transient ischemic attack (TIA), and cerebral infarction without residual deficits: Secondary | ICD-10-CM | POA: Diagnosis not present

## 2015-11-06 DIAGNOSIS — E1122 Type 2 diabetes mellitus with diabetic chronic kidney disease: Secondary | ICD-10-CM | POA: Diagnosis not present

## 2015-11-06 DIAGNOSIS — F17210 Nicotine dependence, cigarettes, uncomplicated: Secondary | ICD-10-CM | POA: Insufficient documentation

## 2015-11-06 DIAGNOSIS — Z8669 Personal history of other diseases of the nervous system and sense organs: Secondary | ICD-10-CM | POA: Insufficient documentation

## 2015-11-06 DIAGNOSIS — Z7984 Long term (current) use of oral hypoglycemic drugs: Secondary | ICD-10-CM | POA: Insufficient documentation

## 2015-11-06 DIAGNOSIS — N189 Chronic kidney disease, unspecified: Secondary | ICD-10-CM | POA: Diagnosis not present

## 2015-11-06 DIAGNOSIS — Z7951 Long term (current) use of inhaled steroids: Secondary | ICD-10-CM | POA: Diagnosis not present

## 2015-11-06 DIAGNOSIS — F329 Major depressive disorder, single episode, unspecified: Secondary | ICD-10-CM | POA: Diagnosis not present

## 2015-11-06 DIAGNOSIS — Z79891 Long term (current) use of opiate analgesic: Secondary | ICD-10-CM | POA: Insufficient documentation

## 2015-11-06 DIAGNOSIS — R1030 Lower abdominal pain, unspecified: Secondary | ICD-10-CM | POA: Diagnosis present

## 2015-11-06 LAB — URINALYSIS COMPLETE WITH MICROSCOPIC (ARMC ONLY)
Bilirubin Urine: NEGATIVE
Glucose, UA: NEGATIVE mg/dL
Ketones, ur: NEGATIVE mg/dL
Nitrite: POSITIVE — AB
PH: 5 (ref 5.0–8.0)
Protein, ur: NEGATIVE mg/dL
Specific Gravity, Urine: 1.002 — ABNORMAL LOW (ref 1.005–1.030)

## 2015-11-06 LAB — CBC
HCT: 34.3 % — ABNORMAL LOW (ref 35.0–47.0)
HEMOGLOBIN: 10.5 g/dL — AB (ref 12.0–16.0)
MCH: 21.4 pg — ABNORMAL LOW (ref 26.0–34.0)
MCHC: 30.5 g/dL — ABNORMAL LOW (ref 32.0–36.0)
MCV: 70.1 fL — ABNORMAL LOW (ref 80.0–100.0)
PLATELETS: 279 10*3/uL (ref 150–440)
RBC: 4.89 MIL/uL (ref 3.80–5.20)
RDW: 22.2 % — ABNORMAL HIGH (ref 11.5–14.5)
WBC: 8.9 10*3/uL (ref 3.6–11.0)

## 2015-11-06 NOTE — ED Notes (Signed)
Pt arrived to the ED via [facility] for complaints of abdominal pain. Pt reports that she was seen in this ED and diagnosed with abdominal wall cellulitis, acute cystitis without hematuria and other specified fever. Pt is AOx4 in no apparent distress.

## 2015-11-07 ENCOUNTER — Emergency Department
Admission: EM | Admit: 2015-11-07 | Discharge: 2015-11-07 | Disposition: A | Discharge status: Home / Self Care | Payer: Medicare Other | Attending: Emergency Medicine | Admitting: Emergency Medicine

## 2015-11-07 DIAGNOSIS — R1030 Lower abdominal pain, unspecified: Secondary | ICD-10-CM

## 2015-11-07 DIAGNOSIS — N390 Urinary tract infection, site not specified: Secondary | ICD-10-CM

## 2015-11-07 LAB — BASIC METABOLIC PANEL
Anion gap: 13 (ref 5–15)
CHLORIDE: 105 mmol/L (ref 101–111)
CO2: 17 mmol/L — ABNORMAL LOW (ref 22–32)
Calcium: 9.5 mg/dL (ref 8.9–10.3)
Creatinine, Ser: 0.73 mg/dL (ref 0.44–1.00)
GFR calc non Af Amer: >60 mL/min (ref >60)
Glucose, Bld: 86 mg/dL (ref 65–99)
POTASSIUM: 3.6 mmol/L (ref 3.5–5.1)
SODIUM: 135 mmol/L (ref 135–145)

## 2015-11-07 MED ORDER — LIDOCAINE HCL (PF) 1 % IJ SOLN
INTRAMUSCULAR | Status: AC
  Administered 2015-11-07: 2.1 mL
  Filled 2015-11-07: qty 5

## 2015-11-07 MED ORDER — CEFTRIAXONE SODIUM 1 G IJ SOLR
1.0000 g | Freq: Once | INTRAMUSCULAR | Status: AC
  Administered 2015-11-07: 1 g via INTRAMUSCULAR
  Filled 2015-11-07: qty 10

## 2015-11-07 MED ORDER — CEPHALEXIN 500 MG PO CAPS: 500.0000 mg | ORAL_CAPSULE | Freq: Three times a day (TID) | ORAL | Status: DC

## 2015-11-07 MED ORDER — PROCHLORPERAZINE EDISYLATE 5 MG/ML IJ SOLN
5.0000 mg | Freq: Once | INTRAMUSCULAR | Status: AC
  Administered 2015-11-07: 5 mg via INTRAMUSCULAR
  Filled 2015-11-07: qty 2

## 2015-11-07 MED ORDER — DIPHENHYDRAMINE HCL 50 MG/ML IJ SOLN
25.0000 mg | Freq: Once | INTRAMUSCULAR | Status: AC
  Administered 2015-11-07: 25 mg via INTRAMUSCULAR
  Filled 2015-11-07: qty 1

## 2015-11-07 MED ORDER — LIDOCAINE HCL (PF) 1 % IJ SOLN
2.1000 mL | Freq: Once | INTRAMUSCULAR | Status: AC
  Administered 2015-11-07: 2.1 mL

## 2015-11-07 NOTE — Discharge Instructions (Signed)
1. Take antibiotic as prescribed (Keflex 500 mg 3 times daily 10 days). 2. Urine culture is pending. You will be called if we need to change your antibiotic. 3. Return to the ER for worsening symptoms, persistent vomiting, difficult breathing or other concerns.  Abdominal Pain, Adult Many things can cause abdominal pain. Usually, abdominal pain is not caused by a disease and will improve without treatment. It can often be observed and treated at home. Your health care provider will do a physical exam and possibly order blood tests and X-rays to help determine the seriousness of your pain. However, in many cases, more time must pass before a clear cause of the pain can be found. Before that point, your health care provider may not know if you need more testing or further treatment. HOME CARE INSTRUCTIONS Monitor your abdominal pain for any changes. The following actions may help to alleviate any discomfort you are experiencing:  Only take over-the-counter or prescription medicines as directed by your health care provider.  Do not take laxatives unless directed to do so by your health care provider.  Try a clear liquid diet (broth, tea, or water) as directed by your health care provider. Slowly move to a bland diet as tolerated. SEEK MEDICAL CARE IF:  You have unexplained abdominal pain.  You have abdominal pain associated with nausea or diarrhea.  You have pain when you urinate or have a bowel movement.  You experience abdominal pain that wakes you in the night.  You have abdominal pain that is worsened or improved by eating food.  You have abdominal pain that is worsened with eating fatty foods.  You have a fever. SEEK IMMEDIATE MEDICAL CARE IF:  Your pain does not go away within 2 hours.  You keep throwing up (vomiting).  Your pain is felt only in portions of the abdomen, such as the right side or the left lower portion of the abdomen.  You pass bloody or black tarry  stools. MAKE SURE YOU:  Understand these instructions.  Will watch your condition.  Will get help right away if you are not doing well or get worse.   This information is not intended to replace advice given to you by your health care provider. Make sure you discuss any questions you have with your health care provider.   Document Released: 04/03/2005 Document Revised: 03/15/2015 Document Reviewed: 03/03/2013 Elsevier Interactive Patient Education 2016 Elsevier Inc.  Urinary Tract Infection Urinary tract infections (UTIs) can develop anywhere along your urinary tract. Your urinary tract is your body's drainage system for removing wastes and extra water. Your urinary tract includes two kidneys, two ureters, a bladder, and a urethra. Your kidneys are a pair of bean-shaped organs. Each kidney is about the size of your fist. They are located below your ribs, one on each side of your spine. CAUSES Infections are caused by microbes, which are microscopic organisms, including fungi, viruses, and bacteria. These organisms are so small that they can only be seen through a microscope. Bacteria are the microbes that most commonly cause UTIs. SYMPTOMS  Symptoms of UTIs may vary by age and gender of the patient and by the location of the infection. Symptoms in young women typically include a frequent and intense urge to urinate and a painful, burning feeling in the bladder or urethra during urination. Older women and men are more likely to be tired, shaky, and weak and have muscle aches and abdominal pain. A fever may mean the infection is in  your kidneys. Other symptoms of a kidney infection include pain in your back or sides below the ribs, nausea, and vomiting. DIAGNOSIS To diagnose a UTI, your caregiver will ask you about your symptoms. Your caregiver will also ask you to provide a urine sample. The urine sample will be tested for bacteria and white blood cells. White blood cells are made by your body  to help fight infection. TREATMENT  Typically, UTIs can be treated with medication. Because most UTIs are caused by a bacterial infection, they usually can be treated with the use of antibiotics. The choice of antibiotic and length of treatment depend on your symptoms and the type of bacteria causing your infection. HOME CARE INSTRUCTIONS  If you were prescribed antibiotics, take them exactly as your caregiver instructs you. Finish the medication even if you feel better after you have only taken some of the medication.  Drink enough water and fluids to keep your urine clear or pale yellow.  Avoid caffeine, tea, and carbonated beverages. They tend to irritate your bladder.  Empty your bladder often. Avoid holding urine for long periods of time.  Empty your bladder before and after sexual intercourse.  After a bowel movement, women should cleanse from front to back. Use each tissue only once. SEEK MEDICAL CARE IF:   You have back pain.  You develop a fever.  Your symptoms do not begin to resolve within 3 days. SEEK IMMEDIATE MEDICAL CARE IF:   You have severe back pain or lower abdominal pain.  You develop chills.  You have nausea or vomiting.  You have continued burning or discomfort with urination. MAKE SURE YOU:   Understand these instructions.  Will watch your condition.  Will get help right away if you are not doing well or get worse.   This information is not intended to replace advice given to you by your health care provider. Make sure you discuss any questions you have with your health care provider.   Document Released: 04/03/2005 Document Revised: 03/15/2015 Document Reviewed: 08/02/2011 Elsevier Interactive Patient Education [employer].

## 2015-11-07 NOTE — ED Provider Notes (Signed)
[facility] Emergency Department Provider Note   ____________________________________________  Time seen: Approximately 2:28 AM  I have reviewed the triage vital signs and the nursing notes.   HISTORY  Chief Complaint Abdominal Pain    HPI [name] is a 61 y.o. female who presents to the ED from home via EMS with a chief complaint of abdominal pain and feels like she has another UTI. Patient has a history of multiple medical issues including frequent UTI, rectovaginal fistula, who was hospitalized approximately 3 weeks ago for sepsis secondary to UTI. She finished a ten-day course of Keflex. Presents this morning secondary to suprapubic discomfortnausea, vomiting and diarrhea. She also notes a chronic complaint of green discharge in her underwear which she feels is secondary to her fistula. Reports her doctors have been aware of her fistula for years and "refuse to fix it". Denies associated fever, chills, chest pain, shortness of breath. Denies recent travel or trauma. Nothing makes her symptoms better or worse.   Past Medical History  Diagnosis Date  . Emphysema of lung ([place])   . Depression   . Diverticulitis   . Chronic bronchitis ([place])   . Malignant brain tumor ([place]) 2007  . GERD (gastroesophageal reflux disease)   . Allergy   . Hepatitis C   . Hypertension   . Chronic kidney disease   . Migraines   . Urine incontinence   . Seizures ([place])   . Stroke [facility]) 2007    during brain surgery  . Pancreatitis, alcoholic 9562  . Rectovaginal fistula   . H/O ETOH abuse     Sober since 2009  . H/O drug abuse     Clean since 2009  . Pancreatic ascites   . Cirrhosis ([place])   . Diabetes mellitus without complication [facility])     Patient Active Problem List   Diagnosis Date Noted  . UTI (lower urinary tract infection) 10/22/2015  . Chest pain on breathing   . SOB (shortness of breath)   . Wheezing   . Subdural hematoma ([place]) 08/16/2015  . Sepsis  ([place]) 06/22/2015  . Orthostatic hypotension 05/15/2015  . ALC (alcoholic liver cirrhosis) ([place]) 12/23/2014  . Clinical depression 12/23/2014  . Anxiety 12/23/2014  . Diabetes mellitus, type 2 ([place]) 12/23/2014  . HTN (hypertension) 11/05/2014  . Liver cirrhosis ([place]) 07/19/2014  . 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 Chronic pancreatitis ([place]) 11/03/2012  . Seizure disorder ([place]) 11/03/2012  . Benign meningioma of brain ([place]) 11/03/2012  . Chronic pelvic pain in female 11/03/2012  . Hepatitis C virus infection without hepatic coma 11/03/2012  . UTI (urinary tract infection) 11/03/2012  . Alcohol abuse, unspecified 11/03/2012  . Chronic liver disease 11/03/2012  . Schizophrenia ([place]) 11/03/2012  . Narcotic abuse 11/03/2012  . 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 Breast screening 11/03/2012  . 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 Nondependent alcohol abuse 11/03/2012  . 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 Female genital symptoms 11/03/2012    Past Surgical History  Procedure Laterality Date  . Appendectomy  1999  . Eye surgery    . Rectovaginal fistula repair w/ colostomy  2011  . Brain surgery  2007    malignant brain tumor  . Cholecystectomy  2001  . Abdominal hysterectomy  1979  . Craniotomy Right 08/16/2015    Procedure: Right Fronto-Temporal-Parietal Craniotomy for Evacuation of Hematoma with Bone Flap Placement in Abdomen;  Surgeon: Ashok Pall, MD;  Location:  [facility];  Service: Neurosurgery;  Laterality: Right;  . Cranioplasty N/A 09/15/2015    Procedure: Cranioplasty with retrieval of bone flap from abdominal pocket;  Surgeon: Ashok Pall, MD;  Location: [facility];  Service: Neurosurgery;  Laterality: N/A;  Cranioplasty with retrieval of bone flap from abdominal pocket    Current Outpatient Rx  Name  Route  Sig  Dispense  Refill  . ALPRAZolam (XANAX) 1 MG tablet   Oral   Take 1 tablet  (1 mg total) by mouth 3 (three) times daily as needed for anxiety.   30 tablet   0   . amitriptyline (ELAVIL) 50 MG tablet   Oral   Take 1 tablet (50 mg total) by mouth at bedtime.   30 tablet   3   . cephALEXin (KEFLEX) 500 MG capsule   Oral   Take 1 capsule (500 mg total) by mouth 3 (three) times daily.   21 capsule   0   . diphenhydrAMINE (BENADRYL) 25 mg capsule   Oral   Take 1 capsule (25 mg total) by mouth every 8 (eight) hours as needed for itching.   30 capsule   0   . Fluticasone-Salmeterol (ADVAIR) 500-50 MCG/DOSE AEPB   Inhalation   Inhale 1 puff into the lungs 2 (two) times daily.         Marland Kitchen gabapentin (NEURONTIN) 300 MG capsule   Oral   Take 300 mg by mouth 3 (three) times daily.          . hydrocortisone cream 0.5 %   Topical   Apply 1 application topically 2 (two) times daily as needed for itching.   30 g   0   . Ipratropium-Albuterol (COMBIVENT) 20-100 MCG/ACT AERS respimat   Inhalation   Inhale 2 puffs into the lungs every 6 (six) hours as needed for wheezing or shortness of breath.          . levETIRAcetam (KEPPRA) 500 MG tablet   Oral   Take 1 tablet (500 mg total) by mouth 2 (two) times daily.   60 tablet   3   . metoprolol succinate (TOPROL-XL) 25 MG 24 hr tablet   Oral   Take 1 tablet (25 mg total) by mouth daily.   30 tablet   4   . omeprazole (PRILOSEC) 20 MG capsule   Oral   Take 1 capsule (20 mg total) by mouth daily.   30 capsule   11     Please stop all of the other PPIs if on profile   . oxyCODONE (OXY IR/ROXICODONE) 5 MG immediate release tablet   Oral   Take 1 tablet (5 mg total) by mouth every 6 (six) hours as needed for moderate pain or severe pain.   40 tablet   0   . oxyCODONE (OXYCONTIN) 20 mg 12 hr tablet   Oral   Take 1 tablet (20 mg total) by mouth every 12 (twelve) hours.   20 tablet   0   . Pancrelipase, Lip-Prot-Amyl, (CREON) 24000 units CPEP   Oral   Take 24,000 Units by mouth 3 (three) times  daily with meals.         . pravastatin (PRAVACHOL) 10 MG tablet   Oral   Take 10 mg by mouth daily.          . promethazine (PHENERGAN) 25 MG tablet   Oral   Take 25 mg by mouth every 8 (eight) hours as needed for nausea or  vomiting.         . sertraline (ZOLOFT) 50 MG tablet   Oral   Take 50 mg by mouth daily.          Marland Kitchen spironolactone (ALDACTONE) 50 MG tablet   Oral   Take 1 tablet (50 mg total) by mouth 2 (two) times daily.   60 tablet   0   . tiotropium (SPIRIVA) 18 MCG inhalation capsule   Inhalation   Place 18 mcg into inhaler and inhale daily.          Marland Kitchen triamcinolone (NASACORT ALLERGY 24HR) 55 MCG/ACT AERO nasal inhaler   Nasal   Place 2 sprays into the nose daily as needed (for rhinitis).         Marland Kitchen zolpidem (AMBIEN) 10 MG tablet   Oral   Take 1 tablet (10 mg total) by mouth at bedtime.   30 tablet   3     Allergies Ibuprofen; Sulfa antibiotics; Amoxicillin; Chocolate; Ciprofloxacin; Dilaudid; Fentanyl; Hydromorphone; Metformin; Penicillins; Strawberry extract; and Zofran  Family History  Problem Relation Age of Onset  . Hypertension Mother   . Heart disease Father   . Diabetes Father   . Cancer Sister     brain  . Cancer Grandchild 8    brain tumor    Social History Social History  Substance Use Topics  . Smoking status: Light Tobacco Smoker -- 0.50 packs/day    Types: Cigarettes  . Smokeless tobacco: Never Used     Comment: cutting back  . Alcohol Use: 2.4 oz/week    4 Cans of beer, 0 Standard drinks or equivalent per week    Review of Systems  Constitutional: No fever/chills. Eyes: No visual changes. ENT: No sore throat. Cardiovascular: Denies chest pain. Respiratory: Denies shortness of breath. Gastrointestinal: Positive for abdominal pain, nausea, vomiting and diarrhea.  No constipation. Genitourinary: Positive for dysuria. Musculoskeletal: Negative for back pain. Skin: Negative for rash. Neurological: Negative for  headaches, focal weakness or numbness.  10-point ROS otherwise negative.  ____________________________________________   PHYSICAL EXAM:  VITAL SIGNS: ED Triage Vitals  Enc Vitals Group     BP 11/06/15 2242 111/63 mmHg     Pulse Rate 11/06/15 2242 112     Resp 11/06/15 2242 20     Temp 11/06/15 2242 98.8 F (37.1 C)     Temp Source 11/06/15 2242 Oral     SpO2 11/06/15 2242 96 %     Weight 11/06/15 2242 231 lb (104.781 kg)     Height 11/06/15 2242 5\' 8"  (1.727 m)     Head Cir --      Peak Flow --      Pain Score 11/06/15 2253 8     Pain Loc --      Pain Edu? --      Excl. in GC? --     Constitutional: Soundly asleep; awakened for exam. Alert and oriented. Well appearing and in no acute distress. Eyes: Conjunctivae are normal. PERRL. EOMI. Head: Atraumatic. Nose: No congestion/rhinnorhea. Mouth/Throat: Mucous membranes are moist.  Oropharynx non-erythematous. Neck: No stridor.   Cardiovascular: Normal rate, regular rhythm. Grossly normal heart sounds.  Good peripheral circulation. Respiratory: Normal respiratory effort.  No retractions. Lungs CTAB. Gastrointestinal: Obese. Soft and nontender. No distention. No abdominal bruits. No CVA tenderness. Genitourinary: Patient pulls down her underwear to show me a greenish substance Musculoskeletal: No lower extremity tenderness nor edema.  No joint effusions. Neurologic:  Normal speech and language. No gross focal  neurologic deficits are appreciated. No gait instability. Skin:  Skin is warm, dry and intact. No rash noted. Psychiatric: Mood and affect are normal. Speech and behavior are normal.  ____________________________________________   LABS (all labs ordered are listed, but only abnormal results are displayed)  Labs Reviewed  URINALYSIS COMPLETEWITH MICROSCOPIC ([facility] ONLY) - Abnormal; Notable for the following:    Color, Urine YELLOW (*)    APPearance CLOUDY (*)    Specific Gravity, Urine 1.002 (*)    Hgb urine  dipstick 2+ (*)    Nitrite POSITIVE (*)    Leukocytes, UA 1+ (*)    Bacteria, UA MANY (*)    Squamous Epithelial / LPF 0-5 (*)    All other components within normal limits  CBC - Abnormal; Notable for the following:    Hemoglobin 10.5 (*)    HCT 34.3 (*)    MCV 70.1 (*)    MCH 21.4 (*)    MCHC 30.5 (*)    RDW 22.2 (*)    All other components within normal limits  BASIC METABOLIC PANEL - Abnormal; Notable for the following:    CO2 17 (*)    BUN <5 (*)    All other components within normal limits   ____________________________________________  EKG  None ____________________________________________  RADIOLOGY  None ____________________________________________   PROCEDURES  Procedure(s) performed: None  Critical Care performed: No  ____________________________________________   INITIAL IMPRESSION / ASSESSMENT AND PLAN / ED COURSE  Pertinent labs & imaging results that were available during my care of the patient were reviewed by me and considered in my medical decision making (see chart for details).  60 year old female who presents with suprapubic pain and dysuria. Laboratory and urinalysis results notable for UTI. She is afebrile and normotensive tonight; low suspicion for sepsis. Review of chart reveals she is being scheduled for an appointment with Dr. Marcelline Mates of OB/GYN to address her myriad of complaints including green discharge in her underwear. Will treat with IM Rocephin in the emergency department and Patient will be given a prescription for Keflex; she is to follow closely with her PCP this week.   ----------------------------------------- 3:58 AM on 11/07/2015 -----------------------------------------  Patient soundly asleep. No evidence of vomiting or diarrhea. Strict return precautions given. Patient verbalizes understanding and agrees with plan of care. ____________________________________________   FINAL CLINICAL IMPRESSION(S) / ED DIAGNOSES  Final  diagnoses:  UTI (lower urinary tract infection)  Lower abdominal pain      NEW MEDICATIONS STARTED DURING THIS VISIT:  New Prescriptions   No medications on file     Note:  This document was prepared using Dragon voice recognition software and may include unintentional dictation errors.    Paulette Blanch, MD 11/07/15 [id number]

## 2015-11-07 NOTE — ED Notes (Signed)
Discharge instructions reviewed with patient. Patient verbalized understanding. Patient taken to lobby via wheelchair to await ride. Pt calling Ladona Mow to take her home.

## 2015-11-08 ENCOUNTER — Ambulatory Visit: Payer: Medicare Other | Admitting: Obstetrics and Gynecology

## 2015-11-10 LAB — URINE CULTURE: Special Requests: NORMAL

## 2015-11-11 NOTE — Progress Notes (Signed)
ER Discharge Culture Review  Patient with ER visit 11/06/15 w/ diagnosis UTI, lower Abdominal pain. Hx UTIs.  Allergies  Allergen Reactions  . Ibuprofen Other (See Comments)    Pt states that she is unable to take this medication because of her liver.    . Sulfa Antibiotics Hives  . Amoxicillin Itching and Rash    Has patient had a PCN reaction causing immediate rash, facial/tongue/throat swelling, SOB or lightheadedness with hypotension: No Has patient had a PCN reaction causing severe rash involving mucus membranes or skin necrosis: No Has patient had a PCN reaction that required hospitalization No Has patient had a PCN reaction occurring within the last 10 years: Yes If all of the above answers are "NO", then may proceed with Cephalosporin use.  . Chocolate Rash  . Ciprofloxacin Itching and Rash  . Dilaudid [Hydromorphone Hcl] Itching  . Fentanyl Rash  . Hydromorphone Itching  . Metformin Nausea And Vomiting  . Penicillins Itching, Rash and Other (See Comments)    Has patient had a PCN reaction causing immediate rash, facial/tongue/throat swelling, SOB or lightheadedness with hypotension: No Has patient had a PCN reaction causing severe rash involving mucus membranes or skin necrosis: No Has patient had a PCN reaction that required hospitalization No Has patient had a PCN reaction occurring within the last 10 years: Yes If all of the above answers are "NO", then may proceed with Cephalosporin use.  . Strawberry Extract Rash  . Zofran [Ondansetron Hcl] Itching and Nausea And Vomiting    5/1-Urine Cx: > 100,000 Pseudomonas Aeruginosa                           Resistant to Unasyn  Patient discharged on Cephalexin. Per Dr. Behe Broom OK to change antibiotic to Levaquin 750mg  po Daily x 5 days. (Cipro= itch,rash). Only Oral option to tx Pseudomonas is usually a Fluoroquinolone.  Called Patient at [phone]. Patient said she had been to [facility] the day prior  and they gave her a prescription for Cipro. Patient has not picked up Prescription yet. Patient was concerned about low blood pressure, fever, uncomfortable. She said [facility] wanted to admit her. Explained to patient that the Cipro would treat the Pseudomonas but if she felt bad enough then could call ambulance or come to hospital for treatment.  Chinita Greenland PharmD Clinical Pharmacist 11/11/2015

## 2015-11-14 ENCOUNTER — Telehealth: Payer: Self-pay | Admitting: Obstetrics and Gynecology

## 2015-11-14 NOTE — Telephone Encounter (Signed)
[name] has called twice today and wants to be seen/ she said she was so irritated in her vagina. She asked if she could be worked in this afternoon?

## 2015-11-14 NOTE — Telephone Encounter (Signed)
2 pm tomorrow opened up so I put her there just 2 mins ago. Thanks

## 2015-11-14 NOTE — Telephone Encounter (Signed)
[name] can be seen today at 11:30 (she will not be seen if she is late) otherwise she can be worked into the next available work in [place] (11:30 or 4:30)

## 2015-11-15 ENCOUNTER — Ambulatory Visit (INDEPENDENT_AMBULATORY_CARE_PROVIDER_SITE_OTHER): Payer: Medicare Other | Admitting: Obstetrics and Gynecology

## 2015-11-15 ENCOUNTER — Encounter: Payer: Self-pay | Admitting: Obstetrics and Gynecology

## 2015-11-15 VITALS — BP 117/88 | HR 111 | Wt 237.4 lb

## 2015-11-15 DIAGNOSIS — L02429 Furuncle of limb, unspecified: Secondary | ICD-10-CM | POA: Diagnosis not present

## 2015-11-15 DIAGNOSIS — R399 Unspecified symptoms and signs involving the genitourinary system: Secondary | ICD-10-CM | POA: Diagnosis not present

## 2015-11-15 DIAGNOSIS — G8929 Other chronic pain: Secondary | ICD-10-CM

## 2015-11-15 LAB — POCT URINALYSIS DIPSTICK
BILIRUBIN UA: NEGATIVE
Glucose, UA: NEGATIVE
Ketones, UA: NEGATIVE
NITRITE UA: NEGATIVE
PH UA: 6
PROTEIN UA: NEGATIVE
Spec Grav, UA: 1.01
Urobilinogen, UA: NEGATIVE

## 2015-11-15 MED ORDER — HYDROCORTISONE 1 % EX OINT: 1.0000 "application " | TOPICAL_OINTMENT | Freq: Two times a day (BID) | CUTANEOUS | Status: DC

## 2015-11-15 MED ORDER — OXYCODONE HCL ER 20 MG PO T12A: 20.0000 mg | EXTENDED_RELEASE_TABLET | Freq: Two times a day (BID) | ORAL | Status: DC

## 2015-11-15 MED ORDER — ALPRAZOLAM 1 MG PO TABS: 1.0000 mg | ORAL_TABLET | Freq: Three times a day (TID) | ORAL | Status: DC | PRN

## 2015-11-16 MED ORDER — OXYCODONE HCL 20 MG PO TABS: 1.0000 | ORAL_TABLET | Freq: Two times a day (BID) | ORAL | Status: DC | PRN

## 2015-11-18 NOTE — Progress Notes (Signed)
GYNECOLOGY PROGRESS NOTE  Subjective:    Patient ID: [name], female    DOB: 30-Dec-1955, 60 y.o.   MRN: [id number]  HPI  Patient is a 60 y.o. female who presents for complaints of possible yeast infection (vaginal itching with greenish discharge for several days), with vaginal itching, bump on right inner thigh, and UTI symptoms.  Patient notes that she was diagnosed with a UTI approximately 1.5 weeks ago (review of ER records notes Pseudomonas UTI), treated with Cipro.  Still noting urinary symptoms (urgency).    Patient notes that the last time she was admitted to the hospital, she was told that her spleen had ruptured. Continues to note left sided pain.   The following portions of the patient's history were reviewed and updated as appropriate: allergies, current medications, past family history, past medical history, past social history, past surgical history and problem list.  Review of Systems A comprehensive review of systems was negative except for: Constitutional: positive for fatigue and fevers Gastrointestinal: positive for abdominal pain and diarrhea Genitourinary: positive for mild left-sided abdominal/pelvic pain   Objective:   Blood pressure 117/88, pulse 111, weight 237 lb 6 oz (107.673 kg). General appearance: alert, appears older than stated age and morbidly obese Abdomen: soft, mild tenderness in left abdomen around to left flank.  Incision scar noted in right lower abdomen (patient states this is where a segment of her skull was removed and implanted after her brain surgery) Pelvic: external genitalia normal, no adnexal masses or tenderness, no cervical motion tenderness, positive findings: bladder tenderness or vaginal discharge:  scant, green, brown and thin, rectovaginal septum normal and uterus normal size, shape, and consistency Extremities: extremities normal, atraumatic, no cyanosis or edema Skin: small boil noted on right inner thigh.  Excoriations and  thickened skin noted on bilateral inner thighs and base of vulva Neurologic: Grossly normal   Assessment:   UTI symptoms Boil of right thigh Vaginal discharge Chronic dermatitis Chronic pelvic pain Anxiety with insomina  Plan:   - UTI symptoms - patient notes that she has recently completed course of antibiotic therapy, still noting symptoms.  Recommend OTC Azo, remaining adequately hydrated.  - Discussed skin care management of small boil. Warm compresses to area several times daily.  - Vaginal discharge is same discharge noted over past year, patient with h/o small fistula, with scan amount of stool passage.   - Chronic dermatitis - prescribed hydrocortisone cream to apply BID (notes OTC dosing not helping).  - Chronic pelvic pain - patient with a multitude of comorbidities.  Notes that at her last visit at [facility], they are setting her up for pain clinic in [place], to start visits in July.  States that they gave her pain medication for 1 month, but is about to run out.  Notes that they also recommended her starting morphine for her pain, but patient declines as she notes previously having a crack addiction, and is afraid that the morphine will cause her to become addicted again.  Also notes that they have been mentioning hospice, however patient states that she does not desire to go to hospice at this time.  Desires refill on pain medication. Discussed with patient that I had previously resolved to no longer prescribe her pain medications as it was not GYN related.  Patient notes that she will not ask again after starting pain clinic.  Patient given 1 prescription to last until pain clinic initiated.    - H/o anxiety, with  insomnia.  Patient desires refill on Xanax.  Notes that her previous therapist has moved, and is attempting to get re-established with a new therapist. Is being seen at [facility] place (however they do not prescribe meds). Patient notes that she is "depressed" and  sometimes wishes that "God would just let me die".  States that she feels as though she is being "punished for past sins".  Is seeing a counselor who is helping her to cope with stages of dying.  Spent time attempting to give patient comfort and counseling.   RTC as needed.    A total of 40 minutes were spent face-to-face with the patient during the encounter and over half of that time involved counseling and coordination of care.   Rubie Maid, MD [facility]

## 2015-11-21 ENCOUNTER — Telehealth: Payer: Self-pay | Admitting: Obstetrics and Gynecology

## 2015-11-21 NOTE — Telephone Encounter (Signed)
Patient called stating the spot on her leg and the places on her head where she had surgery may be infected and wanted to know if Cipro can be sent in. Please Advise

## 2015-11-21 NOTE — Telephone Encounter (Signed)
[name] needs  a refill on omeprazole   ([facility] [address]

## 2015-11-21 NOTE — Telephone Encounter (Signed)
Please inform patient that she needs to contact her neurosurgeon or primary physician for these issues as I am not sure what should be used for those areas, and probably needs to be looked at.

## 2015-11-21 NOTE — Telephone Encounter (Signed)
She needs to get this from her PCP. (I looked in her chart and it looks like she has refills, just need to call pharmacy)

## 2015-11-22 ENCOUNTER — Other Ambulatory Visit: Payer: Self-pay | Admitting: Obstetrics and Gynecology

## 2015-11-22 NOTE — Telephone Encounter (Signed)
PATIENT NOTIFIED BY RF

## 2015-11-28 ENCOUNTER — Other Ambulatory Visit: Payer: Self-pay | Admitting: Obstetrics and Gynecology

## 2015-12-02 ENCOUNTER — Encounter: Payer: Self-pay | Admitting: Emergency Medicine

## 2015-12-02 ENCOUNTER — Emergency Department
Admission: EM | Admit: 2015-12-02 | Discharge: 2015-12-02 | Disposition: A | Discharge status: Home / Self Care | Payer: Medicare Other | Attending: Emergency Medicine | Admitting: Emergency Medicine

## 2015-12-02 DIAGNOSIS — I129 Hypertensive chronic kidney disease with stage 1 through stage 4 chronic kidney disease, or unspecified chronic kidney disease: Secondary | ICD-10-CM | POA: Insufficient documentation

## 2015-12-02 DIAGNOSIS — E119 Type 2 diabetes mellitus without complications: Secondary | ICD-10-CM | POA: Insufficient documentation

## 2015-12-02 DIAGNOSIS — Z79899 Other long term (current) drug therapy: Secondary | ICD-10-CM | POA: Insufficient documentation

## 2015-12-02 DIAGNOSIS — T148XXA Other injury of unspecified body region, initial encounter: Secondary | ICD-10-CM

## 2015-12-02 DIAGNOSIS — Z8673 Personal history of transient ischemic attack (TIA), and cerebral infarction without residual deficits: Secondary | ICD-10-CM | POA: Insufficient documentation

## 2015-12-02 DIAGNOSIS — N9989 Other postprocedural complications and disorders of genitourinary system: Secondary | ICD-10-CM | POA: Diagnosis present

## 2015-12-02 DIAGNOSIS — N189 Chronic kidney disease, unspecified: Secondary | ICD-10-CM | POA: Diagnosis not present

## 2015-12-02 DIAGNOSIS — F329 Major depressive disorder, single episode, unspecified: Secondary | ICD-10-CM | POA: Diagnosis not present

## 2015-12-02 DIAGNOSIS — N390 Urinary tract infection, site not specified: Secondary | ICD-10-CM | POA: Insufficient documentation

## 2015-12-02 DIAGNOSIS — R197 Diarrhea, unspecified: Secondary | ICD-10-CM | POA: Diagnosis not present

## 2015-12-02 DIAGNOSIS — F17210 Nicotine dependence, cigarettes, uncomplicated: Secondary | ICD-10-CM | POA: Insufficient documentation

## 2015-12-02 LAB — URINALYSIS COMPLETE WITH MICROSCOPIC (ARMC ONLY)
BILIRUBIN URINE: NEGATIVE
Glucose, UA: NEGATIVE mg/dL
Ketones, ur: NEGATIVE mg/dL
Nitrite: POSITIVE — AB
PH: 5 (ref 5.0–8.0)
Protein, ur: NEGATIVE mg/dL
Specific Gravity, Urine: 1.004 — ABNORMAL LOW (ref 1.005–1.030)

## 2015-12-02 LAB — CBC WITH DIFFERENTIAL/PLATELET
BASOS ABS: 0 10*3/uL (ref 0–0.1)
BASOS PCT: 0 %
Eosinophils Absolute: 0.1 10*3/uL (ref 0–0.7)
Eosinophils Relative: 2 %
HEMATOCRIT: 32.6 % — AB (ref 35.0–47.0)
HEMOGLOBIN: 10 g/dL — AB (ref 12.0–16.0)
LYMPHS PCT: 25 %
Lymphs Abs: 1.9 10*3/uL (ref 1.0–3.6)
MCH: 21.3 pg — ABNORMAL LOW (ref 26.0–34.0)
MCHC: 30.7 g/dL — ABNORMAL LOW (ref 32.0–36.0)
MCV: 69.3 fL — ABNORMAL LOW (ref 80.0–100.0)
MONO ABS: 0.7 10*3/uL (ref 0.2–0.9)
MONOS PCT: 10 %
NEUTROS ABS: 4.7 10*3/uL (ref 1.4–6.5)
NEUTROS PCT: 63 %
Platelets: 171 10*3/uL (ref 150–440)
RBC: 4.7 MIL/uL (ref 3.80–5.20)
RDW: 21.9 % — ABNORMAL HIGH (ref 11.5–14.5)
WBC: 7.4 10*3/uL (ref 3.6–11.0)

## 2015-12-02 LAB — COMPREHENSIVE METABOLIC PANEL
ALBUMIN: 3.6 g/dL (ref 3.5–5.0)
ALK PHOS: 244 U/L — AB (ref 38–126)
ALT: 28 U/L (ref 14–54)
AST: 54 U/L — AB (ref 15–41)
Anion gap: 8 (ref 5–15)
BILIRUBIN TOTAL: 1.3 mg/dL — AB (ref 0.3–1.2)
BUN: 6 mg/dL (ref 6–20)
CALCIUM: 9.3 mg/dL (ref 8.9–10.3)
CO2: 23 mmol/L (ref 22–32)
CREATININE: 0.81 mg/dL (ref 0.44–1.00)
Chloride: 105 mmol/L (ref 101–111)
GFR calc Af Amer: >60 mL/min (ref >60)
GLUCOSE: 83 mg/dL (ref 65–99)
Potassium: 3.9 mmol/L (ref 3.5–5.1)
Sodium: 136 mmol/L (ref 135–145)
TOTAL PROTEIN: 9 g/dL — AB (ref 6.5–8.1)

## 2015-12-02 MED ORDER — ACETAMINOPHEN 500 MG PO TABS
ORAL_TABLET | ORAL | Status: AC
  Filled 2015-12-02: qty 2

## 2015-12-02 MED ORDER — MORPHINE SULFATE (PF) 4 MG/ML IV SOLN
INTRAVENOUS | Status: AC
  Administered 2015-12-02: 4 mg via INTRAVENOUS
  Filled 2015-12-02: qty 1

## 2015-12-02 MED ORDER — CEFUROXIME AXETIL 500 MG PO TABS: 500.0000 mg | ORAL_TABLET | Freq: Two times a day (BID) | ORAL | Status: AC

## 2015-12-02 MED ORDER — SODIUM CHLORIDE 0.9 % IV BOLUS (SEPSIS)
500.0000 mL | Freq: Once | INTRAVENOUS | Status: AC
  Administered 2015-12-02: 500 mL via INTRAVENOUS

## 2015-12-02 MED ORDER — METOCLOPRAMIDE HCL 5 MG/ML IJ SOLN
10.0000 mg | Freq: Once | INTRAMUSCULAR | Status: AC
  Administered 2015-12-02: 10 mg via INTRAVENOUS
  Filled 2015-12-02: qty 2

## 2015-12-02 MED ORDER — ACETAMINOPHEN 325 MG PO TABS
325.0000 mg | ORAL_TABLET | Freq: Once | ORAL | Status: AC
  Administered 2015-12-02: 325 mg via ORAL

## 2015-12-02 MED ORDER — MORPHINE SULFATE (PF) 4 MG/ML IV SOLN
4.0000 mg | Freq: Once | INTRAVENOUS | Status: AC
  Administered 2015-12-02: 4 mg via INTRAVENOUS

## 2015-12-02 MED ORDER — DEXTROSE 5 % IV SOLN
2.0000 g | Freq: Once | INTRAVENOUS | Status: AC
  Administered 2015-12-02: 2 g via INTRAVENOUS
  Filled 2015-12-02: qty 2

## 2015-12-02 MED ORDER — ACETAMINOPHEN 325 MG PO TABS
650.0000 mg | ORAL_TABLET | Freq: Once | ORAL | Status: DC
  Filled 2015-12-02: qty 1

## 2015-12-02 NOTE — ED Notes (Signed)
Pt c/o back pain X1 week. Pt has full movement in all extremities. Pt can ambulate very short distances.

## 2015-12-02 NOTE — Progress Notes (Signed)
Rocephin  Patient has order for Rocephin 2 g IV x 1. Patient does have penicillin allergy;however patent has received rocephin in the past.   Larene Beach, PharmD

## 2015-12-02 NOTE — ED Notes (Signed)
Burning with urination x 5 days. Also states has pain at incision site top of head. Noted open area of incision. No drainage. States has a head bleed.

## 2015-12-02 NOTE — ED Provider Notes (Signed)
[facility] Emergency Department Provider Note   ____________________________________________  Time seen: Approximately 315 PM  I have reviewed the triage vital signs and the nursing notes.   HISTORY  Chief Complaint Dysuria and Post-op Problem   HPI [name] is a 60 y.o. female with a history of a rectovaginal fistula with recurring UTIs as well as recent craniotomy with skull flap replacement was presenting to the emergency department today with 1 week of bilateral lower back pain as well as burning and urgency with urination. She is also saying that she is about 10 episodes of diarrhea over the past week. She says that she just finished a course of Cipro this past Wednesday. Also with nausea and vomiting. Says that she has cramping in her abdomen before she has a bowel movement but does not have any pain at this time.Patient says that there is a small minor blood in her stool which she says is remittent with a rectovaginal fistula. Denies any blood in her vomitus.   Past Medical History  Diagnosis Date  . Emphysema of lung ([place])   . Depression   . Diverticulitis   . Chronic bronchitis ([place])   . Malignant brain tumor ([place]) 2007  . GERD (gastroesophageal reflux disease)   . Allergy   . Hepatitis C   . Hypertension   . Chronic kidney disease   . Migraines   . Urine incontinence   . Seizures ([place])   . Stroke [facility]) 2007    during brain surgery  . Pancreatitis, alcoholic 0000000  . Rectovaginal fistula   . H/O ETOH abuse     Sober since 2009  . H/O drug abuse     Clean since 2009  . Pancreatic ascites   . Cirrhosis ([place])   . Diabetes mellitus without complication [facility])     Patient Active Problem List   Diagnosis Date Noted  . UTI (lower urinary tract infection) 10/22/2015  . Chest pain on breathing   . SOB (shortness of breath)   . Wheezing   . Subdural hematoma ([place]) 08/16/2015  . Sepsis ([place]) 06/22/2015  . Orthostatic hypotension  05/15/2015  . ALC (alcoholic liver cirrhosis) ([place]) 12/23/2014  . Clinical depression 12/23/2014  . Anxiety 12/23/2014  . Diabetes mellitus, type 2 ([place]) 12/23/2014  . HTN (hypertension) 11/05/2014  . Liver cirrhosis ([place]) 07/19/2014  . 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 Chronic pancreatitis ([place]) 11/03/2012  . Seizure disorder ([place]) 11/03/2012  . Benign meningioma of brain ([place]) 11/03/2012  . Chronic pelvic pain in female 11/03/2012  . Hepatitis C virus infection without hepatic coma 11/03/2012  . UTI (urinary tract infection) 11/03/2012  . Alcohol abuse, unspecified 11/03/2012  . Chronic liver disease 11/03/2012  . Schizophrenia ([place]) 11/03/2012  . Narcotic abuse 11/03/2012  . Benign neoplasm of cerebral meninges ([place]) 11/03/2012  . Breast screening 11/03/2012  . 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 Nondependent alcohol abuse 11/03/2012  . 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 Female genital symptoms 11/03/2012    Past Surgical History  Procedure Laterality Date  . Appendectomy  1999  . Eye surgery    . Rectovaginal fistula repair w/ colostomy  2011  . Brain surgery  2007    malignant brain tumor  . Cholecystectomy  2001  . Abdominal hysterectomy  1979  . Craniotomy Right 08/16/2015    Procedure: Right Fronto-Temporal-Parietal Craniotomy for Evacuation of Hematoma with Bone Flap Placement in Abdomen;  Surgeon: Marylyn Ishihara  Christella Noa, MD;  Location: [facility];  Service: Neurosurgery;  Laterality: Right;  . Cranioplasty N/A 09/15/2015    Procedure: Cranioplasty with retrieval of bone flap from abdominal pocket;  Surgeon: Ashok Pall, MD;  Location: [facility];  Service: Neurosurgery;  Laterality: N/A;  Cranioplasty with retrieval of bone flap from abdominal pocket    Current Outpatient Rx  Name  Route  Sig  Dispense  Refill  . ALPRAZolam (XANAX) 1 MG tablet   Oral   Take 1 tablet (1 mg total) by mouth 3 (three) times daily  as needed for anxiety.   90 tablet   0   . amitriptyline (ELAVIL) 50 MG tablet   Oral   Take 1 tablet (50 mg total) by mouth at bedtime.   30 tablet   3   . cephALEXin (KEFLEX) 500 MG capsule   Oral   Take 1 capsule (500 mg total) by mouth 3 (three) times daily.   30 capsule   0   . diphenhydrAMINE (BENADRYL) 25 mg capsule   Oral   Take 1 capsule (25 mg total) by mouth every 8 (eight) hours as needed for itching.   30 capsule   0   . Fluticasone-Salmeterol (ADVAIR) 500-50 MCG/DOSE AEPB   Inhalation   Inhale 1 puff into the lungs 2 (two) times daily.         Marland Kitchen gabapentin (NEURONTIN) 300 MG capsule   Oral   Take 300 mg by mouth 3 (three) times daily.          . hydrocortisone 1 % ointment   Topical   Apply 1 application topically 2 (two) times daily.   30 g   0   . Ipratropium-Albuterol (COMBIVENT) 20-100 MCG/ACT AERS respimat   Inhalation   Inhale 2 puffs into the lungs every 6 (six) hours as needed for wheezing or shortness of breath.          . levETIRAcetam (KEPPRA) 500 MG tablet   Oral   Take 1 tablet (500 mg total) by mouth 2 (two) times daily.   60 tablet   3   . metoprolol succinate (TOPROL-XL) 25 MG 24 hr tablet   Oral   Take 1 tablet (25 mg total) by mouth daily.   30 tablet   4   . omeprazole (PRILOSEC) 20 MG capsule   Oral   Take 1 capsule (20 mg total) by mouth daily.   30 capsule   11     Please stop all of the other PPIs if on profile   . Oxycodone HCl 20 MG TABS   Oral   Take 1 tablet (20 mg total) by mouth every 12 (twelve) hours as needed.   120 tablet   0   . Pancrelipase, Lip-Prot-Amyl, (CREON) 24000 units CPEP   Oral   Take 24,000 Units by mouth 3 (three) times daily with meals.         . pravastatin (PRAVACHOL) 10 MG tablet   Oral   Take 10 mg by mouth daily.          . promethazine (PHENERGAN) 25 MG tablet   Oral   Take 25 mg by mouth every 8 (eight) hours as needed for nausea or vomiting.         .  sertraline (ZOLOFT) 50 MG tablet   Oral   Take 50 mg by mouth daily.          Marland Kitchen spironolactone (ALDACTONE) 50 MG tablet  Oral   Take 1 tablet (50 mg total) by mouth 2 (two) times daily.   60 tablet   0   . tiotropium (SPIRIVA) 18 MCG inhalation capsule   Inhalation   Place 18 mcg into inhaler and inhale daily.          Marland Kitchen triamcinolone (NASACORT ALLERGY 24HR) 55 MCG/ACT AERO nasal inhaler   Nasal   Place 2 sprays into the nose daily as needed (for rhinitis).         Marland Kitchen zolpidem (AMBIEN) 10 MG tablet   Oral   Take 1 tablet (10 mg total) by mouth at bedtime.   30 tablet   3     Allergies Ibuprofen; Sulfa antibiotics; Amoxicillin; Chocolate; Ciprofloxacin; Dilaudid; Fentanyl; Hydromorphone; Metformin; Penicillins; Strawberry extract; and Zofran  Family History  Problem Relation Age of Onset  . Hypertension Mother   . Heart disease Father   . Diabetes Father   . Cancer Sister     brain  . Cancer Grandchild 8    brain tumor    Social History Social History  Substance Use Topics  . Smoking status: Light Tobacco Smoker -- 0.50 packs/day    Types: Cigarettes  . Smokeless tobacco: Never Used     Comment: cutting back  . Alcohol Use: 2.4 oz/week    4 Cans of beer, 0 Standard drinks or equivalent per week    Review of Systems Constitutional: No fever/chills Eyes: No visual changes. ENT: No sore throat. Cardiovascular: Denies chest pain. Respiratory: Denies shortness of breath. Gastrointestinal:  No constipation. Genitourinary: Negative for dysuria. Musculoskeletal: As above Skin: Negative for rash. Neurological: Negative for headaches, focal weakness or numbness.  10-point ROS otherwise negative.  ____________________________________________   PHYSICAL EXAM:  VITAL SIGNS: ED Triage Vitals  Enc Vitals Group     BP 12/02/15 1310 101/78 mmHg     Pulse Rate 12/02/15 1310 99     Resp 12/02/15 1310 20     Temp 12/02/15 1310 98.5 F (36.9 C)     Temp  Source 12/02/15 1310 Oral     SpO2 12/02/15 1310 96 %     Weight 12/02/15 1310 231 lb (104.781 kg)     Height 12/02/15 1310 5\' 8"  (1.727 m)     Head Cir --      Peak Flow --      Pain Score 12/02/15 1313 9     Pain Loc --      Pain Edu? --      Excl. in [place]? --     Constitutional: Alert and oriented. Well appearing and in no acute distress. Eyes: Conjunctivae are normal. PERRL. EOMI. Head: Craniotomy site without any surrounding induration. Small area of dehiscence with visualized skull in a window about 3 mm which is circular. No tenderness. No pus. Nose: No congestion/rhinnorhea. Mouth/Throat: Mucous membranes are moist.   Neck: No stridor.   Cardiovascular: Normal rate, regular rhythm. Grossly normal heart sounds.  Respiratory: Normal respiratory effort.  No retractions. Lungs CTAB. Gastrointestinal: Soft With mild tenderness to the right upper quadrant over with the patient had her skull flap stored. No duration over this area. Healthy scar tissue visualized. No dehiscence. No distention.  CVA tenderness bilaterally. Musculoskeletal: No lower extremity tenderness nor edema.   Neurologic:  Normal speech and language. No gross focal neurologic deficits are appreciated. Skin:  Skin is warm, dry and intact. No rash noted. Psychiatric: Mood and affect are normal. Speech and behavior are normal.  ____________________________________________  LABS (all labs ordered are listed, but only abnormal results are displayed)  Labs Reviewed  CBC WITH DIFFERENTIAL/PLATELET - Abnormal; Notable for the following:    Hemoglobin 10.0 (*)    HCT 32.6 (*)    MCV 69.3 (*)    MCH 21.3 (*)    MCHC 30.7 (*)    RDW 21.9 (*)    All other components within normal limits  COMPREHENSIVE METABOLIC PANEL - Abnormal; Notable for the following:    Total Protein 9.0 (*)    AST 54 (*)    Alkaline Phosphatase 244 (*)    Total Bilirubin 1.3 (*)    All other components within normal limits  URINALYSIS  COMPLETEWITH MICROSCOPIC ([facility] ONLY) - Abnormal; Notable for the following:    Color, Urine YELLOW (*)    APPearance CLEAR (*)    Specific Gravity, Urine 1.004 (*)    Hgb urine dipstick 1+ (*)    Nitrite POSITIVE (*)    Leukocytes, UA 2+ (*)    Bacteria, UA FEW (*)    Squamous Epithelial / LPF 6-30 (*)    All other components within normal limits  URINE CULTURE  C DIFFICILE QUICK SCREEN W PCR REFLEX  GASTROINTESTINAL PANEL BY PCR, STOOL (REPLACES STOOL CULTURE)   ____________________________________________  EKG   ____________________________________________  RADIOLOGY   ____________________________________________   PROCEDURES  Angiocath insertion Performed by: Doran Stabler  Consent: Verbal consent obtained. Risks and benefits: risks, benefits and alternatives were discussed Time out: Immediately prior to procedure a "time out" was called to verify the correct patient, procedure, equipment, support staff and site/side marked as required.  Preparation: Patient was prepped and draped in the usual sterile fashion.  Vein Location: Right basilic  Ultrasound Guided  Gauge: 16  Normal blood return and flush without difficulty Patient tolerance: Patient tolerated the procedure well with no immediate complications.    ____________________________________________   INITIAL IMPRESSION / ASSESSMENT AND PLAN / ED COURSE  Pertinent labs & imaging results that were available during my care of the patient were reviewed by me and considered in my medical decision making (see chart for details).  ----------------------------------------- 9:20 PM on 12/02/2015 -----------------------------------------  Patient resting comfortably at this time. Has not had a bowel movement throughout her stay in the emergency department has tried multiple times to move her bowel toilet but has not been successful. Diarrhea appears to be subsiding. We will discharge with Ceftin for  antibiotic coverage for her urinary tract infection. She will also be following up with her neurosurgeon for the chronic wound healing issues after her craniotomy. She also has been evaluated small lesion on her mons. It is about a 3 mm open lesion which is superficial. There is no surrounding induration. No pus. No erythema. I recommended that she use antibiotic ointment over this area. She understands the plan for discharge willing to comply especially with follow-up with her outpatient doctors. Respiratory rate of 20 upon final evaluation.  ____________________________________________   FINAL CLINICAL IMPRESSION(S) / ED DIAGNOSES  Diarrhea. Chronic urinary tract infection.      NEW MEDICATIONS STARTED DURING THIS VISIT:  New Prescriptions   No medications on file     Note:  This document was prepared using Dragon voice recognition software and may include unintentional dictation errors.    Orbie Pyo, MD 12/02/15 2121

## 2015-12-02 NOTE — ED Notes (Signed)
IV removed per pt request. Pt states that IV "has to come out because I(she) can't bear this". IV removed by this RN. No swelling or redness noticed and catheter was intact. Pt given ice bag to help with discomfort.

## 2015-12-04 LAB — URINE CULTURE

## 2015-12-05 ENCOUNTER — Telehealth: Payer: Self-pay | Admitting: Pharmacist

## 2015-12-05 NOTE — Telephone Encounter (Signed)
Called pt informed her of cx reports. Ecoli and enterococcus resistant to cefuroxime. Pt [facility] rx [facility] [address] in [place]. Informed pt to stop taking cefuroxime and start taking macrobid. macrobid 100mg  BID x 7 days called into [facility]. Pt had just brought in scrip for ceftin (5/17) and cephalexin from (5/2). Informed pharmacist to cancel both of those RX. Uc resistant to both.  Ramond Dial, Pharm.D Clinical Pharmacist

## 2015-12-06 ENCOUNTER — Ambulatory Visit: Payer: Medicare Other | Admitting: Obstetrics and Gynecology

## 2015-12-12 ENCOUNTER — Telehealth: Payer: Self-pay | Admitting: Obstetrics and Gynecology

## 2015-12-12 NOTE — Telephone Encounter (Signed)
Her fistula is getting worse and she wants to talk to you . She ask if you could call her before the day is over.

## 2015-12-12 NOTE — Telephone Encounter (Signed)
Pt decided after i sent note she wanted an appt. She's on tomorrow at  pm

## 2015-12-13 ENCOUNTER — Ambulatory Visit: Payer: Medicare Other | Admitting: Obstetrics and Gynecology

## 2015-12-21 ENCOUNTER — Ambulatory Visit: Payer: Medicare Other | Admitting: Obstetrics and Gynecology

## 2015-12-21 ENCOUNTER — Telehealth: Payer: Self-pay | Admitting: Obstetrics and Gynecology

## 2015-12-21 ENCOUNTER — Other Ambulatory Visit: Payer: Self-pay | Admitting: Obstetrics and Gynecology

## 2015-12-21 NOTE — Telephone Encounter (Signed)
DR CHERRY Maddisyn HAS CALLED [place] TWICE THIS AFTERNOON. SHE ASKED TO SPEAK TO ME SHE SAID CAUSE SHE WANTED TO JUST HEAR A SWEET VOICE. DR CHERRY i JUST DON'T KNOW HOW TO HANDLE THIS. i DO FEEL SORRY FOR THIS PT AND SHE THINKS SHE IS DYING BUT WE DON'T HAVE TIME TO SIT AND TALK TO HER. SHE SAID THE ER DON'T WANT TO SEE HER CAUSE SHE COMES TO MUCH AND SHE JUST WANTS PAIN MEDICATION. WHAT ARE WE TO DO ABOUT HER? SHE OF COURSE ASKED FOR YOU TO CALL HER AND SHE WANTS TO BE ADMITTED. HELP!!!!

## 2015-12-22 NOTE — Telephone Encounter (Signed)
We just need to remind [name] that this is a business office and that as much as we care about her situation, we have to find the proper time to address her concerns.  She can schedule a "30 minute visit" to discuss her concerns, however at this time our schedule is very booked.  She does have doctors at [facility] and a psychiatrist with whom could also address her needs if they are more urgent.

## 2015-12-26 ENCOUNTER — Inpatient Hospital Stay (HOSPITAL_COMMUNITY)
Admission: AD | Admit: 2015-12-26 | Discharge: 2016-01-05 | DRG: 856 | Disposition: A | Discharge status: Home Health | Payer: Medicare Other | Source: Ambulatory Visit | Attending: Neurosurgery | Admitting: Neurosurgery

## 2015-12-26 DIAGNOSIS — Z79899 Other long term (current) drug therapy: Secondary | ICD-10-CM | POA: Diagnosis not present

## 2015-12-26 DIAGNOSIS — Z882 Allergy status to sulfonamides status: Secondary | ICD-10-CM | POA: Diagnosis not present

## 2015-12-26 DIAGNOSIS — I9581 Postprocedural hypotension: Secondary | ICD-10-CM | POA: Diagnosis not present

## 2015-12-26 DIAGNOSIS — Z8673 Personal history of transient ischemic attack (TIA), and cerebral infarction without residual deficits: Secondary | ICD-10-CM

## 2015-12-26 DIAGNOSIS — Z85841 Personal history of malignant neoplasm of brain: Secondary | ICD-10-CM | POA: Diagnosis not present

## 2015-12-26 DIAGNOSIS — Z880 Allergy status to penicillin: Secondary | ICD-10-CM | POA: Diagnosis not present

## 2015-12-26 DIAGNOSIS — E1122 Type 2 diabetes mellitus with diabetic chronic kidney disease: Secondary | ICD-10-CM | POA: Diagnosis present

## 2015-12-26 DIAGNOSIS — M8618 Other acute osteomyelitis, other site: Secondary | ICD-10-CM | POA: Diagnosis not present

## 2015-12-26 DIAGNOSIS — Z888 Allergy status to other drugs, medicaments and biological substances status: Secondary | ICD-10-CM

## 2015-12-26 DIAGNOSIS — T8132XA Disruption of internal operation (surgical) wound, not elsewhere classified, initial encounter: Secondary | ICD-10-CM | POA: Diagnosis present

## 2015-12-26 DIAGNOSIS — J42 Unspecified chronic bronchitis: Secondary | ICD-10-CM | POA: Diagnosis present

## 2015-12-26 DIAGNOSIS — J439 Emphysema, unspecified: Secondary | ICD-10-CM | POA: Diagnosis present

## 2015-12-26 DIAGNOSIS — Z885 Allergy status to narcotic agent status: Secondary | ICD-10-CM | POA: Diagnosis not present

## 2015-12-26 DIAGNOSIS — Z91018 Allergy to other foods: Secondary | ICD-10-CM | POA: Diagnosis not present

## 2015-12-26 DIAGNOSIS — T8131XD Disruption of external operation (surgical) wound, not elsewhere classified, subsequent encounter: Secondary | ICD-10-CM | POA: Diagnosis not present

## 2015-12-26 DIAGNOSIS — M869 Osteomyelitis, unspecified: Secondary | ICD-10-CM | POA: Diagnosis present

## 2015-12-26 DIAGNOSIS — B1920 Unspecified viral hepatitis C without hepatic coma: Secondary | ICD-10-CM | POA: Diagnosis present

## 2015-12-26 DIAGNOSIS — F17210 Nicotine dependence, cigarettes, uncomplicated: Secondary | ICD-10-CM | POA: Diagnosis present

## 2015-12-26 DIAGNOSIS — G9340 Encephalopathy, unspecified: Secondary | ICD-10-CM | POA: Diagnosis present

## 2015-12-26 DIAGNOSIS — K219 Gastro-esophageal reflux disease without esophagitis: Secondary | ICD-10-CM | POA: Diagnosis present

## 2015-12-26 DIAGNOSIS — Z90710 Acquired absence of both cervix and uterus: Secondary | ICD-10-CM | POA: Diagnosis not present

## 2015-12-26 DIAGNOSIS — Z7951 Long term (current) use of inhaled steroids: Secondary | ICD-10-CM

## 2015-12-26 DIAGNOSIS — B9562 Methicillin resistant Staphylococcus aureus infection as the cause of diseases classified elsewhere: Secondary | ICD-10-CM | POA: Diagnosis present

## 2015-12-26 DIAGNOSIS — T814XXS Infection following a procedure, sequela: Secondary | ICD-10-CM

## 2015-12-26 DIAGNOSIS — Z886 Allergy status to analgesic agent status: Secondary | ICD-10-CM

## 2015-12-26 DIAGNOSIS — Z9889 Other specified postprocedural states: Secondary | ICD-10-CM

## 2015-12-26 DIAGNOSIS — B961 Klebsiella pneumoniae [K. pneumoniae] as the cause of diseases classified elsewhere: Secondary | ICD-10-CM | POA: Diagnosis not present

## 2015-12-26 DIAGNOSIS — T814XXA Infection following a procedure, initial encounter: Principal | ICD-10-CM | POA: Diagnosis present

## 2015-12-26 DIAGNOSIS — N189 Chronic kidney disease, unspecified: Secondary | ICD-10-CM | POA: Diagnosis present

## 2015-12-26 DIAGNOSIS — T8149XA Infection following a procedure, other surgical site, initial encounter: Secondary | ICD-10-CM | POA: Diagnosis present

## 2015-12-26 DIAGNOSIS — Y838 Other surgical procedures as the cause of abnormal reaction of the patient, or of later complication, without mention of misadventure at the time of the procedure: Secondary | ICD-10-CM | POA: Diagnosis not present

## 2015-12-26 DIAGNOSIS — K7460 Unspecified cirrhosis of liver: Secondary | ICD-10-CM | POA: Diagnosis present

## 2015-12-26 DIAGNOSIS — I129 Hypertensive chronic kidney disease with stage 1 through stage 4 chronic kidney disease, or unspecified chronic kidney disease: Secondary | ICD-10-CM | POA: Diagnosis present

## 2015-12-26 DIAGNOSIS — B9689 Other specified bacterial agents as the cause of diseases classified elsewhere: Secondary | ICD-10-CM | POA: Diagnosis not present

## 2015-12-26 DIAGNOSIS — IMO0001 Reserved for inherently not codable concepts without codable children: Secondary | ICD-10-CM

## 2015-12-26 LAB — MAGNESIUM: Magnesium: 1.8 mg/dL (ref 1.7–2.4)

## 2015-12-26 LAB — CBC WITH DIFFERENTIAL/PLATELET
BASOS PCT: 0 %
Basophils Absolute: 0 10*3/uL (ref 0.0–0.1)
EOS PCT: 2 %
Eosinophils Absolute: 0.1 10*3/uL (ref 0.0–0.7)
HEMATOCRIT: 30.4 % — AB (ref 36.0–46.0)
Hemoglobin: 8.7 g/dL — ABNORMAL LOW (ref 12.0–15.0)
LYMPHS ABS: 1.4 10*3/uL (ref 0.7–4.0)
Lymphocytes Relative: 37 %
MCH: 21.2 pg — AB (ref 26.0–34.0)
MCHC: 28.6 g/dL — AB (ref 30.0–36.0)
MCV: 74 fL — AB (ref 78.0–100.0)
MONO ABS: 0.3 10*3/uL (ref 0.1–1.0)
Monocytes Relative: 9 %
NEUTROS ABS: 1.9 10*3/uL (ref 1.7–7.7)
Neutrophils Relative %: 52 %
Platelets: 123 10*3/uL — ABNORMAL LOW (ref 150–400)
RBC: 4.11 MIL/uL (ref 3.87–5.11)
RDW: 21.2 % — AB (ref 11.5–15.5)
Smear Review: DECREASED
WBC: 3.7 10*3/uL — ABNORMAL LOW (ref 4.0–10.5)

## 2015-12-26 LAB — URINALYSIS, ROUTINE W REFLEX MICROSCOPIC
BILIRUBIN URINE: NEGATIVE
Glucose, UA: NEGATIVE mg/dL
Ketones, ur: NEGATIVE mg/dL
NITRITE: NEGATIVE
Protein, ur: NEGATIVE mg/dL
SPECIFIC GRAVITY, URINE: 1.012 (ref 1.005–1.030)
pH: 6.5 (ref 5.0–8.0)

## 2015-12-26 LAB — URINE MICROSCOPIC-ADD ON

## 2015-12-26 LAB — COMPREHENSIVE METABOLIC PANEL
ALBUMIN: 2.9 g/dL — AB (ref 3.5–5.0)
ALK PHOS: 157 U/L — AB (ref 38–126)
ALT: 28 U/L (ref 14–54)
ANION GAP: 5 (ref 5–15)
AST: 46 U/L — ABNORMAL HIGH (ref 15–41)
BUN: 8 mg/dL (ref 6–20)
CHLORIDE: 111 mmol/L (ref 101–111)
CO2: 23 mmol/L (ref 22–32)
Calcium: 9 mg/dL (ref 8.9–10.3)
Creatinine, Ser: 0.84 mg/dL (ref 0.44–1.00)
GFR calc Af Amer: >60 mL/min (ref >60)
GFR calc non Af Amer: >60 mL/min (ref >60)
GLUCOSE: 175 mg/dL — AB (ref 65–99)
Potassium: 3.4 mmol/L — ABNORMAL LOW (ref 3.5–5.1)
SODIUM: 139 mmol/L (ref 135–145)
Total Bilirubin: 1.2 mg/dL (ref 0.3–1.2)
Total Protein: 7.8 g/dL (ref 6.5–8.1)

## 2015-12-26 LAB — APTT: aPTT: 32 seconds (ref 24–37)

## 2015-12-26 LAB — PROTIME-INR
INR: 1.4 (ref 0.00–1.49)
Prothrombin Time: 17.3 seconds — ABNORMAL HIGH (ref 11.6–15.2)

## 2015-12-26 LAB — PHOSPHORUS: Phosphorus: 3.4 mg/dL (ref 2.5–4.6)

## 2015-12-26 MED ORDER — VANCOMYCIN HCL IN DEXTROSE 750-5 MG/150ML-% IV SOLN
750.0000 mg | Freq: Two times a day (BID) | INTRAVENOUS | Status: DC
  Administered 2015-12-27 – 2015-12-30 (×6): 750 mg via INTRAVENOUS
  Filled 2015-12-26 (×10): qty 150

## 2015-12-26 MED ORDER — POTASSIUM CHLORIDE IN NACL 20-0.9 MEQ/L-% IV SOLN
INTRAVENOUS | Status: DC
  Administered 2015-12-26 – 2015-12-31 (×6): via INTRAVENOUS
  Administered 2016-01-01: 1000 mL via INTRAVENOUS
  Administered 2016-01-02 – 2016-01-04 (×3): via INTRAVENOUS
  Administered 2016-01-05: 1000 mL via INTRAVENOUS
  Filled 2015-12-26 (×26): qty 1000

## 2015-12-26 MED ORDER — ZOLPIDEM TARTRATE 5 MG PO TABS
10.0000 mg | ORAL_TABLET | Freq: Every day | ORAL | Status: DC
  Administered 2015-12-26 – 2016-01-05 (×9): 10 mg via ORAL
  Filled 2015-12-26 (×9): qty 2

## 2015-12-26 MED ORDER — PANCRELIPASE (LIP-PROT-AMYL) 12000-38000 UNITS PO CPEP
24000.0000 [IU] | ORAL_CAPSULE | Freq: Three times a day (TID) | ORAL | Status: DC
  Administered 2015-12-26 – 2016-01-05 (×27): 24000 [IU] via ORAL
  Filled 2015-12-26 (×28): qty 2

## 2015-12-26 MED ORDER — ALPRAZOLAM 0.5 MG PO TABS
1.0000 mg | ORAL_TABLET | Freq: Three times a day (TID) | ORAL | Status: DC | PRN
  Administered 2015-12-26 – 2016-01-04 (×13): 1 mg via ORAL
  Filled 2015-12-26 (×13): qty 2

## 2015-12-26 MED ORDER — METOPROLOL SUCCINATE ER 25 MG PO TB24
25.0000 mg | ORAL_TABLET | Freq: Every day | ORAL | Status: DC
  Administered 2015-12-27 – 2016-01-05 (×5): 25 mg via ORAL
  Filled 2015-12-26 (×7): qty 1

## 2015-12-26 MED ORDER — PANTOPRAZOLE SODIUM 40 MG PO TBEC
40.0000 mg | DELAYED_RELEASE_TABLET | Freq: Every day | ORAL | Status: DC
  Administered 2015-12-26 – 2016-01-05 (×10): 40 mg via ORAL
  Filled 2015-12-26 (×10): qty 1

## 2015-12-26 MED ORDER — DOCUSATE SODIUM 100 MG PO CAPS
100.0000 mg | ORAL_CAPSULE | Freq: Two times a day (BID) | ORAL | Status: DC
  Administered 2015-12-26 – 2016-01-05 (×17): 100 mg via ORAL
  Filled 2015-12-26 (×17): qty 1

## 2015-12-26 MED ORDER — VANCOMYCIN HCL 10 G IV SOLR
2000.0000 mg | Freq: Once | INTRAVENOUS | Status: AC
  Administered 2015-12-26: 2000 mg via INTRAVENOUS
  Filled 2015-12-26: qty 2000

## 2015-12-26 MED ORDER — ONDANSETRON HCL 4 MG PO TABS
4.0000 mg | ORAL_TABLET | Freq: Four times a day (QID) | ORAL | Status: DC | PRN
  Filled 2015-12-26: qty 1

## 2015-12-26 MED ORDER — SODIUM CHLORIDE 0.9% FLUSH
3.0000 mL | Freq: Two times a day (BID) | INTRAVENOUS | Status: DC
  Administered 2015-12-29 – 2016-01-05 (×7): 3 mL via INTRAVENOUS

## 2015-12-26 MED ORDER — ONDANSETRON HCL 4 MG/2ML IJ SOLN: 4.0000 mg | Freq: Four times a day (QID) | INTRAMUSCULAR | Status: DC | PRN

## 2015-12-26 MED ORDER — MOMETASONE FURO-FORMOTEROL FUM 200-5 MCG/ACT IN AERO
2.0000 | INHALATION_SPRAY | Freq: Two times a day (BID) | RESPIRATORY_TRACT | Status: DC
  Administered 2015-12-26 – 2016-01-05 (×15): 2 via RESPIRATORY_TRACT
  Filled 2015-12-26 (×4): qty 8.8

## 2015-12-26 MED ORDER — SPIRONOLACTONE 25 MG PO TABS
50.0000 mg | ORAL_TABLET | Freq: Two times a day (BID) | ORAL | Status: DC
  Administered 2015-12-26 – 2016-01-05 (×16): 50 mg via ORAL
  Filled 2015-12-26 (×2): qty 2
  Filled 2015-12-26: qty 1
  Filled 2015-12-26 (×5): qty 2
  Filled 2015-12-26: qty 1
  Filled 2015-12-26 (×10): qty 2

## 2015-12-26 MED ORDER — OXYCODONE HCL 20 MG PO TABS: 1.0000 | ORAL_TABLET | Freq: Two times a day (BID) | ORAL | Status: DC | PRN

## 2015-12-26 MED ORDER — AMITRIPTYLINE HCL 25 MG PO TABS
50.0000 mg | ORAL_TABLET | Freq: Every day | ORAL | Status: DC
  Administered 2015-12-26 – 2016-01-04 (×9): 50 mg via ORAL
  Filled 2015-12-26 (×9): qty 2

## 2015-12-26 MED ORDER — IPRATROPIUM-ALBUTEROL 0.5-2.5 (3) MG/3ML IN SOLN: 3.0000 mL | Freq: Four times a day (QID) | RESPIRATORY_TRACT | Status: DC | PRN

## 2015-12-26 MED ORDER — ACETAMINOPHEN 650 MG RE SUPP: 650.0000 mg | Freq: Four times a day (QID) | RECTAL | Status: DC | PRN

## 2015-12-26 MED ORDER — ACETAMINOPHEN 325 MG PO TABS
650.0000 mg | ORAL_TABLET | Freq: Four times a day (QID) | ORAL | Status: DC | PRN
  Administered 2016-01-03: 650 mg via ORAL
  Filled 2015-12-26: qty 2

## 2015-12-26 MED ORDER — OXYCODONE HCL 5 MG PO TABS
5.0000 mg | ORAL_TABLET | ORAL | Status: DC | PRN
  Administered 2015-12-26 – 2016-01-05 (×16): 5 mg via ORAL
  Filled 2015-12-26 (×15): qty 1

## 2015-12-26 MED ORDER — TIOTROPIUM BROMIDE MONOHYDRATE 18 MCG IN CAPS
18.0000 ug | ORAL_CAPSULE | Freq: Every day | RESPIRATORY_TRACT | Status: DC
  Administered 2015-12-27 – 2016-01-05 (×9): 18 ug via RESPIRATORY_TRACT
  Filled 2015-12-26 (×4): qty 5

## 2015-12-26 MED ORDER — ONDANSETRON HCL 4 MG PO TABS
4.0000 mg | ORAL_TABLET | Freq: Three times a day (TID) | ORAL | Status: DC
  Administered 2015-12-26 – 2016-01-05 (×27): 4 mg via ORAL
  Filled 2015-12-26 (×27): qty 1

## 2015-12-26 MED ORDER — HEPARIN SODIUM (PORCINE) 5000 UNIT/ML IJ SOLN
5000.0000 [IU] | Freq: Three times a day (TID) | INTRAMUSCULAR | Status: DC
  Administered 2015-12-26 – 2016-01-05 (×28): 5000 [IU] via SUBCUTANEOUS
  Filled 2015-12-26 (×28): qty 1

## 2015-12-26 MED ORDER — NICOTINE 14 MG/24HR TD PT24
14.0000 mg | MEDICATED_PATCH | TRANSDERMAL | Status: DC
  Administered 2015-12-26 – 2016-01-04 (×9): 14 mg via TRANSDERMAL
  Filled 2015-12-26 (×10): qty 1

## 2015-12-26 MED ORDER — LEVETIRACETAM 500 MG PO TABS
500.0000 mg | ORAL_TABLET | Freq: Two times a day (BID) | ORAL | Status: DC
  Administered 2015-12-26 – 2016-01-05 (×18): 500 mg via ORAL
  Filled 2015-12-26 (×18): qty 1

## 2015-12-26 MED ORDER — SODIUM CHLORIDE 0.9% FLUSH: 3.0000 mL | INTRAVENOUS | Status: DC | PRN

## 2015-12-26 MED ORDER — CLOTRIMAZOLE 1 % VA CREA: 1.0000 | TOPICAL_CREAM | Freq: Every day | VAGINAL | Status: DC

## 2015-12-26 MED ORDER — OXYCODONE HCL ER 15 MG PO T12A
15.0000 mg | EXTENDED_RELEASE_TABLET | Freq: Two times a day (BID) | ORAL | Status: DC
  Administered 2015-12-26 – 2016-01-05 (×18): 15 mg via ORAL
  Filled 2015-12-26 (×18): qty 1

## 2015-12-26 MED ORDER — GABAPENTIN 300 MG PO CAPS
300.0000 mg | ORAL_CAPSULE | Freq: Three times a day (TID) | ORAL | Status: DC
  Administered 2015-12-26 – 2016-01-05 (×26): 300 mg via ORAL
  Filled 2015-12-26 (×13): qty 1
  Filled 2015-12-26: qty 3
  Filled 2015-12-26 (×13): qty 1

## 2015-12-26 MED ORDER — HYDROCORTISONE 1 % EX OINT: 1.0000 "application " | TOPICAL_OINTMENT | Freq: Two times a day (BID) | CUTANEOUS | Status: DC

## 2015-12-26 MED ORDER — SODIUM CHLORIDE 0.9 % IV SOLN: 250.0000 mL | INTRAVENOUS | Status: DC | PRN

## 2015-12-26 MED ORDER — SERTRALINE HCL 50 MG PO TABS
50.0000 mg | ORAL_TABLET | Freq: Every day | ORAL | Status: DC
  Administered 2015-12-26 – 2016-01-05 (×10): 50 mg via ORAL
  Filled 2015-12-26 (×10): qty 1

## 2015-12-26 MED ORDER — IPRATROPIUM-ALBUTEROL 20-100 MCG/ACT IN AERS: 2.0000 | INHALATION_SPRAY | Freq: Four times a day (QID) | RESPIRATORY_TRACT | Status: DC | PRN

## 2015-12-26 MED ORDER — PRAVASTATIN SODIUM 20 MG PO TABS
10.0000 mg | ORAL_TABLET | Freq: Every day | ORAL | Status: DC
  Administered 2015-12-26 – 2016-01-04 (×9): 10 mg via ORAL
  Filled 2015-12-26 (×10): qty 1

## 2015-12-26 MED ORDER — DIPHENHYDRAMINE HCL 25 MG PO CAPS
25.0000 mg | ORAL_CAPSULE | Freq: Three times a day (TID) | ORAL | Status: DC | PRN
  Administered 2015-12-26 – 2015-12-28 (×4): 25 mg via ORAL
  Filled 2015-12-26 (×5): qty 1

## 2015-12-26 MED ORDER — TRIAMCINOLONE ACETONIDE 55 MCG/ACT NA AERO
2.0000 | INHALATION_SPRAY | Freq: Every day | NASAL | Status: DC | PRN
  Filled 2015-12-26: qty 21.6

## 2015-12-26 NOTE — Progress Notes (Signed)
Patient is a direct admit from home. Patient alert and oriented x 4. Patient oriented to room. Will continue tomonitor

## 2015-12-26 NOTE — Progress Notes (Signed)
Pharmacy Antibiotic Note  [name] is a 60 y.o. female admitted on 12/26/2015 with wound infection- exposed skull.  Pharmacy has been consulted for vancomycin dosing.  Patient had subdural hematoma s/p evacuation, then cranioplasty with bone flap replacement on 3/10. She had been calling neurosurgery office for ~2 months with complaints of drainage, however did not go in to the office until 6/19- scalp defect with visible skull and hardware.  Normalized CrCl ~43mL/min   Plan: -Vancomycin 2000mg  IV x1 as loading dose, then 750mg  IV q12h -Follow for surgical plans, renal function, trough at SS     Temp (24hrs), Avg:98.4 F (36.9 C), Min:98 F (36.7 C), Max:98.7 F (37.1 C)   Recent Labs Lab 12/26/15 1341  WBC 3.7*  CREATININE 0.84    CrCl cannot be calculated (Unknown ideal weight.).    Allergies  Allergen Reactions  . Ibuprofen Other (See Comments)    Pt states that she is unable to take this medication because of her liver.    . Sulfa Antibiotics Hives  . Amoxicillin Itching, Rash and Other (See Comments)    Has patient had a PCN reaction causing immediate rash, facial/tongue/throat swelling, SOB or lightheadedness with hypotension: No Has patient had a PCN reaction causing severe rash involving mucus membranes or skin necrosis: No Has patient had a PCN reaction that required hospitalization No Has patient had a PCN reaction occurring within the last 10 years: Yes If all of the above answers are "NO", then may proceed with Cephalosporin use.  . Chocolate Rash  . Ciprofloxacin Itching and Rash  . Dilaudid [Hydromorphone Hcl] Itching  . Fentanyl Rash  . Hydromorphone Itching  . Metformin Nausea And Vomiting  . Penicillins Itching, Rash and Other (See Comments)    Has patient had a PCN reaction causing immediate rash, facial/tongue/throat swelling, SOB or lightheadedness with hypotension: No Has patient had a PCN reaction causing severe rash involving mucus membranes  or skin necrosis: No Has patient had a PCN reaction that required hospitalization No Has patient had a PCN reaction occurring within the last 10 years: Yes If all of the above answers are "NO", then may proceed with Cephalosporin use.  . Strawberry Extract Rash  . Zofran [Ondansetron Hcl] Itching and Nausea And Vomiting    Antimicrobials this admission: Vancomycin 6/20>>  Dose adjustments this admission: n/a  Microbiology results: None ordered at this time  Thank you for allowing pharmacy to be a part of this patient's care.  Angle Karel D. Babacar Haycraft, PharmD, BCPS Clinical Pharmacist Pager: [phone] 12/26/2015 7:21 PM

## 2015-12-26 NOTE — Progress Notes (Signed)
MD's office called to notify that patient is here.

## 2015-12-26 NOTE — H&P (Signed)
[name] is an 60 y.o. female.   Chief Complaint: wound infection HPI: whom presents today for wound debridement, craniotomy flap removal, possible skin closure. [name] presented originally with a subdural hematoma which was evacuated, however the bone flap could not be replaced. The flap was place in the abdominal wall. [name] then returned for cranioplasty using the original flap. This was performed on 09/15/15. She did well post op and returned home. [name] originally called the office approximately 2 months ago reporting that she had an infection and that something was draining from her wound. She was instructed to come to the office so that I could examine the wound. [name] did not come in, she called repeatedly over this time, and was told on each occasion to come to the office, and each time she did not come. [name] was able to come to the office yesterday and had a scalp defect with visible skull, and hardware. No neurologic deficits.   Past Medical History  Diagnosis Date  . Emphysema of lung ([place])   . Depression   . Diverticulitis   . Chronic bronchitis ([place])   . Malignant brain tumor ([employer]) 2007  . GERD (gastroesophageal reflux disease)   . Allergy   . Hepatitis C   . Hypertension   . Chronic kidney disease   . Migraines   . Urine incontinence   . Seizures ([place])   . Stroke [facility]) 2007    during brain surgery  . Pancreatitis, alcoholic 2505  . Rectovaginal fistula   . H/O ETOH abuse     Sober since 2009  . H/O drug abuse     Clean since 2009  . Pancreatic ascites   . Cirrhosis ([place])   . Diabetes mellitus without complication [facility])     Past Surgical History  Procedure Laterality Date  . Appendectomy  1999  . Eye surgery    . Rectovaginal fistula repair w/ colostomy  2011  . Brain surgery  2007    malignant brain tumor  . Cholecystectomy  2001  . Abdominal hysterectomy  1979  . Craniotomy Right 08/16/2015    Procedure: Right Fronto-Temporal-Parietal  Craniotomy for Evacuation of Hematoma with Bone Flap Placement in Abdomen;  Surgeon: Ashok Pall, MD;  Location: [place] NEURO ORS;  Service: Neurosurgery;  Laterality: Right;  . Cranioplasty N/A 09/15/2015    Procedure: Cranioplasty with retrieval of bone flap from abdominal pocket;  Surgeon: Ashok Pall, MD;  Location: [place] NEURO ORS;  Service: Neurosurgery;  Laterality: N/A;  Cranioplasty with retrieval of bone flap from abdominal pocket    Family History  Problem Relation Age of Onset  . Hypertension Mother   . Heart disease Father   . Diabetes Father   . Cancer Sister     brain  . Cancer Grandchild 8    brain tumor   Social History:  reports that she has been smoking Cigarettes.  She has been smoking about 0.50 packs per day. She has never used smokeless tobacco. She reports that she drinks about 2.4 oz of alcohol per week. She reports that she does not use illicit drugs.  Allergies:  Allergies  Allergen Reactions  . Ibuprofen Other (See Comments)    Pt states that she is unable to take this medication because of her liver.    . Sulfa Antibiotics Hives  . Amoxicillin Itching, Rash and Other (See Comments)    Has patient had a PCN reaction causing immediate rash, facial/tongue/throat swelling, SOB  or lightheadedness with hypotension: No Has patient had a PCN reaction causing severe rash involving mucus membranes or skin necrosis: No Has patient had a PCN reaction that required hospitalization No Has patient had a PCN reaction occurring within the last 10 years: Yes If all of the above answers are "NO", then may proceed with Cephalosporin use.  . Chocolate Rash  . Ciprofloxacin Itching and Rash  . Dilaudid [Hydromorphone Hcl] Itching  . Fentanyl Rash  . Hydromorphone Itching  . Metformin Nausea And Vomiting  . Penicillins Itching, Rash and Other (See Comments)    Has patient had a PCN reaction causing immediate rash, facial/tongue/throat swelling, SOB or lightheadedness with  hypotension: No Has patient had a PCN reaction causing severe rash involving mucus membranes or skin necrosis: No Has patient had a PCN reaction that required hospitalization No Has patient had a PCN reaction occurring within the last 10 years: Yes If all of the above answers are "NO", then may proceed with Cephalosporin use.  . Strawberry Extract Rash  . Zofran [Ondansetron Hcl] Itching and Nausea And Vomiting    Medications Prior to Admission  Medication Sig Dispense Refill  . albuterol (PROVENTIL HFA;VENTOLIN HFA) 108 (90 Base) MCG/ACT inhaler Inhale 2 puffs into the lungs every 6 (six) hours as needed for wheezing or shortness of breath.    Marland Kitchen albuterol (PROVENTIL) (5 MG/ML) 0.5% nebulizer solution Take 2.5 mg by nebulization 4 (four) times daily.    Marland Kitchen ALPRAZolam (XANAX) 1 MG tablet Take 1 tablet (1 mg total) by mouth 3 (three) times daily as needed for anxiety. (Patient taking differently: Take 1 mg by mouth 2 (two) times daily as needed for anxiety. ) 90 tablet 0  . amitriptyline (ELAVIL) 50 MG tablet Take 1 tablet (50 mg total) by mouth at bedtime. 30 tablet 3  . cephALEXin (KEFLEX) 500 MG capsule Take 1 capsule (500 mg total) by mouth 3 (three) times daily. 30 capsule 0  . diphenhydrAMINE (BENADRYL) 25 mg capsule Take 1 capsule (25 mg total) by mouth every 8 (eight) hours as needed for itching. 30 capsule 0  . Fluticasone-Salmeterol (ADVAIR) 250-50 MCG/DOSE AEPB Inhale 2 puffs into the lungs 2 (two) times daily.    Marland Kitchen gabapentin (NEURONTIN) 300 MG capsule Take 600 mg by mouth 3 (three) times daily.     . hydrOXYzine (VISTARIL) 50 MG capsule Take 50 mg by mouth 3 (three) times daily.    . Ipratropium-Albuterol (COMBIVENT) 20-100 MCG/ACT AERS respimat Inhale 2 puffs into the lungs every 6 (six) hours as needed for wheezing or shortness of breath.     . levETIRAcetam (KEPPRA) 500 MG tablet Take 1 tablet (500 mg total) by mouth 2 (two) times daily. 60 tablet 3  . metoprolol succinate  (TOPROL-XL) 25 MG 24 hr tablet Take 1 tablet (25 mg total) by mouth daily. 30 tablet 4  . nitrofurantoin, macrocrystal-monohydrate, (MACROBID) 100 MG capsule Take 100 mg by mouth 2 (two) times daily.  0  . omeprazole (PRILOSEC) 20 MG capsule Take 1 capsule (20 mg total) by mouth daily. 30 capsule 11  . Oxycodone HCl 20 MG TABS Take 1 tablet (20 mg total) by mouth every 12 (twelve) hours as needed. (Patient taking differently: Take 1 tablet by mouth every 12 (twelve) hours as needed (pain). ) 120 tablet 0  . Pancrelipase, Lip-Prot-Amyl, (CREON) 24000 units CPEP Take 24,000 Units by mouth 3 (three) times daily with meals.    Marland Kitchen PARoxetine (PAXIL) 20 MG tablet Take 20 mg by  mouth daily.    . phenytoin (DILANTIN) 125 MG/5ML suspension Take 225 mg by mouth at bedtime.    . pravastatin (PRAVACHOL) 10 MG tablet Take 10 mg by mouth daily.     . promethazine (PHENERGAN) 25 MG tablet Take 25 mg by mouth every 8 (eight) hours as needed for nausea or vomiting.    . sertraline (ZOLOFT) 50 MG tablet Take 50 mg by mouth daily.     Marland Kitchen spironolactone (ALDACTONE) 50 MG tablet Take 1 tablet (50 mg total) by mouth 2 (two) times daily. 60 tablet 0  . tiotropium (SPIRIVA) 18 MCG inhalation capsule Place 18 mcg into inhaler and inhale daily.     Marland Kitchen zolpidem (AMBIEN) 10 MG tablet Take 1 tablet (10 mg total) by mouth at bedtime. 30 tablet 3  . triamcinolone (NASACORT ALLERGY 24HR) 55 MCG/ACT AERO nasal inhaler Place 2 sprays into the nose daily as needed (for rhinitis).      Results for orders placed or performed during the hospital encounter of 12/26/15 (from the past 48 hour(s))  Comprehensive metabolic panel     Status: Abnormal   Collection Time: 12/26/15  1:41 PM  Result Value Ref Range   Sodium 139 135 - 145 mmol/L   Potassium 3.4 (L) 3.5 - 5.1 mmol/L   Chloride 111 101 - 111 mmol/L   CO2 23 22 - 32 mmol/L   Glucose, Bld 175 (H) 65 - 99 mg/dL   BUN 8 6 - 20 mg/dL   Creatinine, Ser 0.84 0.44 - 1.00 mg/dL    Calcium 9.0 8.9 - 10.3 mg/dL   Total Protein 7.8 6.5 - 8.1 g/dL   Albumin 2.9 (L) 3.5 - 5.0 g/dL   AST 46 (H) 15 - 41 U/L   ALT 28 14 - 54 U/L   Alkaline Phosphatase 157 (H) 38 - 126 U/L   Total Bilirubin 1.2 0.3 - 1.2 mg/dL   GFR calc non Af Amer >60 >60 mL/min   GFR calc Af Amer >60 >60 mL/min    Comment: (NOTE) The eGFR has been calculated using the CKD EPI equation. This calculation has not been validated in all clinical situations. eGFR's persistently <60 mL/min signify possible Chronic Kidney Disease.    Anion gap 5 5 - 15  CBC WITH DIFFERENTIAL     Status: Abnormal   Collection Time: 12/26/15  1:41 PM  Result Value Ref Range   WBC 3.7 (L) 4.0 - 10.5 K/uL   RBC 4.11 3.87 - 5.11 MIL/uL   Hemoglobin 8.7 (L) 12.0 - 15.0 g/dL   HCT 30.4 (L) 36.0 - 46.0 %   MCV 74.0 (L) 78.0 - 100.0 fL   MCH 21.2 (L) 26.0 - 34.0 pg   MCHC 28.6 (L) 30.0 - 36.0 g/dL   RDW 21.2 (H) 11.5 - 15.5 %   Platelets 123 (L) 150 - 400 K/uL   Neutrophils Relative % 52 %   Lymphocytes Relative 37 %   Monocytes Relative 9 %   Eosinophils Relative 2 %   Basophils Relative 0 %   Neutro Abs 1.9 1.7 - 7.7 K/uL   Lymphs Abs 1.4 0.7 - 4.0 K/uL   Monocytes Absolute 0.3 0.1 - 1.0 K/uL   Eosinophils Absolute 0.1 0.0 - 0.7 K/uL   Basophils Absolute 0.0 0.0 - 0.1 K/uL   RBC Morphology POLYCHROMASIA PRESENT     Comment: FEW TARGET CELLS NOTED   Smear Review PLATELETS APPEAR DECREASED   APTT     Status: None  Collection Time: 12/26/15  1:41 PM  Result Value Ref Range   aPTT 32 24 - 37 seconds  Protime-INR     Status: Abnormal   Collection Time: 12/26/15  1:41 PM  Result Value Ref Range   Prothrombin Time 17.3 (H) 11.6 - 15.2 seconds   INR 1.40 0.00 - 1.49  Magnesium     Status: None   Collection Time: 12/26/15  1:41 PM  Result Value Ref Range   Magnesium 1.8 1.7 - 2.4 mg/dL  Phosphorus     Status: None   Collection Time: 12/26/15  1:41 PM  Result Value Ref Range   Phosphorus 3.4 2.5 - 4.6 mg/dL   Urinalysis, Routine w reflex microscopic (not at [facility])     Status: Abnormal   Collection Time: 12/26/15  5:11 PM  Result Value Ref Range   Color, Urine YELLOW YELLOW   APPearance CLOUDY (A) CLEAR   Specific Gravity, Urine 1.012 1.005 - 1.030   pH 6.5 5.0 - 8.0   Glucose, UA NEGATIVE NEGATIVE mg/dL   Hgb urine dipstick MODERATE (A) NEGATIVE   Bilirubin Urine NEGATIVE NEGATIVE   Ketones, ur NEGATIVE NEGATIVE mg/dL   Protein, ur NEGATIVE NEGATIVE mg/dL   Nitrite NEGATIVE NEGATIVE   Leukocytes, UA SMALL (A) NEGATIVE  Urine microscopic-add on     Status: Abnormal   Collection Time: 12/26/15  5:11 PM  Result Value Ref Range   Squamous Epithelial / LPF 6-30 (A) NONE SEEN   WBC, UA 0-5 0 - 5 WBC/hpf   RBC / HPF 0-5 0 - 5 RBC/hpf   Bacteria, UA FEW (A) NONE SEEN   No results found.  Review of Systems  Constitutional: Positive for malaise/fatigue.  HENT: Negative.   Eyes: Negative.   Respiratory: Negative.   Cardiovascular: Negative.   Gastrointestinal: Negative.   Genitourinary: Negative.   Musculoskeletal: Negative.   Skin:       Infected scalp   Neurological: Negative.     Blood pressure 106/88, pulse 105, temperature 98.4 F (36.9 C), temperature source Oral, resp. rate 16, SpO2 98 %. Physical Exam  Constitutional: She is oriented to person, place, and time. She appears well-developed and well-nourished. No distress.  HENT:  Has a hole in the scalp and exposed skull, craniotomy flap, and hardware along incision line.   Eyes: Conjunctivae and EOM are normal. Pupils are equal, round, and reactive to light.  Neck: Normal range of motion. Neck supple.  Cardiovascular: Normal rate, regular rhythm and normal heart sounds.   Respiratory: Effort normal and breath sounds normal.  GI: Soft. Bowel sounds are normal.  Neurological: She is alert and oriented to person, place, and time. She has normal reflexes. She displays normal reflexes. No cranial nerve deficit. She exhibits normal  muscle tone. Coordination normal.  Skin: Skin is warm and dry.     Assessment/Plan OR for debridement. Will have to consult plastic surgery for assistance with closure. Anticipate need for a skin graft after appropriate time to treat infection.   Jenisha Faison L, MD 12/26/2015, 6:49 PM

## 2015-12-27 LAB — GLUCOSE, CAPILLARY: GLUCOSE-CAPILLARY: 148 mg/dL — AB (ref 65–99)

## 2015-12-27 NOTE — Progress Notes (Signed)
   12/27/15 1000  Clinical Encounter Type  Visited With Patient not available  Chaplain stopped by again and was told by tech that patient was too sleepy for conversation; observed her sleeping. Have closed out consult but may try again tomorrow to see her. Lido Maske, Chaplain

## 2015-12-27 NOTE — Progress Notes (Signed)
   12/27/15 0900  Clinical Encounter Type  Visited With Patient  Visit Type Initial  Referral From Nurse  Spiritual Encounters  Spiritual Needs Prayer  Stress Factors  Patient Stress Factors Health changes  Chaplain responded to consult and sat with patient briefly. Patient could not keep her eyes open, however, so patient and chaplain agreed on a later visit. [place] Landgrebe, Chaplain

## 2015-12-27 NOTE — Progress Notes (Signed)
Patient ID: [name], female   DOB: 08-24-1955, 60 y.o.   MRN: [id number] BP 83/47 mmHg  Pulse 83  Temp(Src) 98.1 F (36.7 C) (Oral)  Resp 18  Wt 104.8 kg (231 lb 0.7 oz)  SpO2 94% Appreciate help from plastic surgery.  Will plan on removing the bone tomorrow. Primarily closing the wound and letting this area heal.  If and this is a big if, another cranioplasty is performed this will be in the far future. I have explained the plan to the patient.

## 2015-12-27 NOTE — Care Management Important Message (Signed)
Important Message  Patient Details  Name: [name] MRN: [id number] Date of Birth: Sep 19, 1955   Medicare Important Message Given:  Yes    Loann Quill 12/27/2015, 1:50 PM

## 2015-12-27 NOTE — Consult Note (Signed)
Reason for Consult: Scalp wound infection following cranioplasty Referring Physician: Dr. Ashok Pall Neurosurgery [facility] Inpatient Consult Date: 6.21.17  [name] is an 60 y.o. female.  HPI: [name] is a 60 yo female with multiple medical problems who underwent a craniotomy for evacuation of a SDH 08/2015 with placement of the bone flap in the abdominal wall. She underwent cranioplasty with the original flap on 09/15/2015 and was discharged home. At some point over the past couple of months, she developed drainage from the cranioplasty site, but did not follow up in the office with neurosurgery. She presented 12/26/2015 with dehiscence and infection from the cranioplasty site. We are asked to evaluate to assist with management of the wound. She is on Vancomycin for coverage. Wbc 3.7, Hgb 8.7, Plts 123K, INR 1.4. Albumin 2.9, Glucose 175.   Past Medical History  Diagnosis Date  . Emphysema of lung ([place])   . Depression   . Diverticulitis   . Chronic bronchitis ([place])   . Malignant brain tumor ([place]) 2007  . GERD (gastroesophageal reflux disease)   . Allergy   . Hepatitis C   . Hypertension   . Chronic kidney disease   . Migraines   . Urine incontinence   . Seizures ([place])   . Stroke [facility]) 2007    during brain surgery  . Pancreatitis, alcoholic 2633  . Rectovaginal fistula   . H/O ETOH abuse     Sober since 2009  . H/O drug abuse     Clean since 2009  . Pancreatic ascites   . Cirrhosis ([place])   . Diabetes mellitus without complication [facility])     Past Surgical History  Procedure Laterality Date  . Appendectomy  1999  . Eye surgery    . Rectovaginal fistula repair w/ colostomy  2011  . Brain surgery  2007    malignant brain tumor  . Cholecystectomy  2001  . Abdominal hysterectomy  1979  . Craniotomy Right 08/16/2015    Procedure: Right Fronto-Temporal-Parietal Craniotomy for Evacuation of Hematoma with Bone Flap Placement in Abdomen;  Surgeon: Ashok Pall, MD;   Location: [place] NEURO ORS;  Service: Neurosurgery;  Laterality: Right;  . Cranioplasty N/A 09/15/2015    Procedure: Cranioplasty with retrieval of bone flap from abdominal pocket;  Surgeon: Ashok Pall, MD;  Location: [place] NEURO ORS;  Service: Neurosurgery;  Laterality: N/A;  Cranioplasty with retrieval of bone flap from abdominal pocket    Family History  Problem Relation Age of Onset  . Hypertension Mother   . Heart disease Father   . Diabetes Father   . Cancer Sister     brain  . Cancer Grandchild 8    brain tumor    Social History:  reports that she has been smoking Cigarettes.  She has been smoking about 0.50 packs per day. She has never used smokeless tobacco. She reports that she drinks about 2.4 oz of alcohol per week. She reports that she does not use illicit drugs.  Allergies:  Allergies  Allergen Reactions  . Ibuprofen Other (See Comments)    Pt states that she is unable to take this medication because of her liver.    . Sulfa Antibiotics Hives  . Amoxicillin Itching, Rash and Other (See Comments)    Has patient had a PCN reaction causing immediate rash, facial/tongue/throat swelling, SOB or lightheadedness with hypotension: No Has patient had a PCN reaction causing severe rash involving mucus membranes or skin necrosis: No Has patient had a  PCN reaction that required hospitalization No Has patient had a PCN reaction occurring within the last 10 years: Yes If all of the above answers are "NO", then may proceed with Cephalosporin use.  . Chocolate Rash  . Ciprofloxacin Itching and Rash  . Dilaudid [Hydromorphone Hcl] Itching  . Fentanyl Rash  . Hydromorphone Itching  . Metformin Nausea And Vomiting  . Penicillins Itching, Rash and Other (See Comments)    Has patient had a PCN reaction causing immediate rash, facial/tongue/throat swelling, SOB or lightheadedness with hypotension: No Has patient had a PCN reaction causing severe rash involving mucus membranes or skin  necrosis: No Has patient had a PCN reaction that required hospitalization No Has patient had a PCN reaction occurring within the last 10 years: Yes If all of the above answers are "NO", then may proceed with Cephalosporin use.  . Strawberry Extract Rash  . Zofran [Ondansetron Hcl] Itching and Nausea And Vomiting    Medications: I have reviewed the patient's current medications.  Results for orders placed or performed during the hospital encounter of 12/26/15 (from the past 48 hour(s))  Comprehensive metabolic panel     Status: Abnormal   Collection Time: 12/26/15  1:41 PM  Result Value Ref Range   Sodium 139 135 - 145 mmol/L   Potassium 3.4 (L) 3.5 - 5.1 mmol/L   Chloride 111 101 - 111 mmol/L   CO2 23 22 - 32 mmol/L   Glucose, Bld 175 (H) 65 - 99 mg/dL   BUN 8 6 - 20 mg/dL   Creatinine, Ser 0.84 0.44 - 1.00 mg/dL   Calcium 9.0 8.9 - 10.3 mg/dL   Total Protein 7.8 6.5 - 8.1 g/dL   Albumin 2.9 (L) 3.5 - 5.0 g/dL   AST 46 (H) 15 - 41 U/L   ALT 28 14 - 54 U/L   Alkaline Phosphatase 157 (H) 38 - 126 U/L   Total Bilirubin 1.2 0.3 - 1.2 mg/dL   GFR calc non Af Amer >60 >60 mL/min   GFR calc Af Amer >60 >60 mL/min    Comment: (NOTE) The eGFR has been calculated using the CKD EPI equation. This calculation has not been validated in all clinical situations. eGFR's persistently <60 mL/min signify possible Chronic Kidney Disease.    Anion gap 5 5 - 15  CBC WITH DIFFERENTIAL     Status: Abnormal   Collection Time: 12/26/15  1:41 PM  Result Value Ref Range   WBC 3.7 (L) 4.0 - 10.5 K/uL   RBC 4.11 3.87 - 5.11 MIL/uL   Hemoglobin 8.7 (L) 12.0 - 15.0 g/dL   HCT 30.4 (L) 36.0 - 46.0 %   MCV 74.0 (L) 78.0 - 100.0 fL   MCH 21.2 (L) 26.0 - 34.0 pg   MCHC 28.6 (L) 30.0 - 36.0 g/dL   RDW 21.2 (H) 11.5 - 15.5 %   Platelets 123 (L) 150 - 400 K/uL   Neutrophils Relative % 52 %   Lymphocytes Relative 37 %   Monocytes Relative 9 %   Eosinophils Relative 2 %   Basophils Relative 0 %   Neutro  Abs 1.9 1.7 - 7.7 K/uL   Lymphs Abs 1.4 0.7 - 4.0 K/uL   Monocytes Absolute 0.3 0.1 - 1.0 K/uL   Eosinophils Absolute 0.1 0.0 - 0.7 K/uL   Basophils Absolute 0.0 0.0 - 0.1 K/uL   RBC Morphology POLYCHROMASIA PRESENT     Comment: FEW TARGET CELLS NOTED   Smear Review PLATELETS APPEAR DECREASED  APTT     Status: None   Collection Time: 12/26/15  1:41 PM  Result Value Ref Range   aPTT 32 24 - 37 seconds  Protime-INR     Status: Abnormal   Collection Time: 12/26/15  1:41 PM  Result Value Ref Range   Prothrombin Time 17.3 (H) 11.6 - 15.2 seconds   INR 1.40 0.00 - 1.49  Magnesium     Status: None   Collection Time: 12/26/15  1:41 PM  Result Value Ref Range   Magnesium 1.8 1.7 - 2.4 mg/dL  Phosphorus     Status: None   Collection Time: 12/26/15  1:41 PM  Result Value Ref Range   Phosphorus 3.4 2.5 - 4.6 mg/dL  Urinalysis, Routine w reflex microscopic (not at [facility])     Status: Abnormal   Collection Time: 12/26/15  5:11 PM  Result Value Ref Range   Color, Urine YELLOW YELLOW   APPearance CLOUDY (A) CLEAR   Specific Gravity, Urine 1.012 1.005 - 1.030   pH 6.5 5.0 - 8.0   Glucose, UA NEGATIVE NEGATIVE mg/dL   Hgb urine dipstick MODERATE (A) NEGATIVE   Bilirubin Urine NEGATIVE NEGATIVE   Ketones, ur NEGATIVE NEGATIVE mg/dL   Protein, ur NEGATIVE NEGATIVE mg/dL   Nitrite NEGATIVE NEGATIVE   Leukocytes, UA SMALL (A) NEGATIVE  Urine microscopic-add on     Status: Abnormal   Collection Time: 12/26/15  5:11 PM  Result Value Ref Range   Squamous Epithelial / LPF 6-30 (A) NONE SEEN   WBC, UA 0-5 0 - 5 WBC/hpf   RBC / HPF 0-5 0 - 5 RBC/hpf   Bacteria, UA FEW (A) NONE SEEN  Glucose, capillary     Status: Abnormal   Collection Time: 12/27/15  8:37 AM  Result Value Ref Range   Glucose-Capillary 148 (H) 65 - 99 mg/dL   Comment 1 Notify RN    Comment 2 Document in Chart     No results found.  ROS Blood pressure 98/60, pulse 80, temperature 98.2 F (36.8 C), temperature source Oral,  resp. rate 18, weight 104.8 kg (231 lb 0.7 oz), SpO2 98 %. Physical Exam  Vitals reviewed. Constitutional:  Obese female sitting up in chair in NAD.   HENT:  Open scalp wound approximately 8 x 2.5 x 2.5 with 2 cm undermining through out. Purulent tan drainage. Visible bone and screws .   Cardiovascular: Normal rate and regular rhythm.   Respiratory: Effort normal. No respiratory distress.    Assessment/Plan: Dehiscence/infected cranioplasty wound-  Agree with irrigation and debridement in the OR with check of bone viability at that time.  Concerned that with chronic drainage and likely infection that may have osteomyelitis.  May need removal of flap and hardware to clear and likely referral to tertiary care for flap repair once infection cleared.  May want ID to see as well.   Discussed with Dr. Iran Planas.  RAYBURN,SHAWN,PA-C Plastic Surgery [phone]  I have seen and examined patient. She is independent living with family. No family at bedside. Suffered SDH for fall, per patient stroke prior to this but appears chronic small vessel disease. Bone flap replaced 09/2015. Per patient has had open area and draining for months. Of note left temporal craniotomy 2007 for tumor. Has history chronic UTI and rectovaginal fistula, unclear reason for latter.   On exam right temporal scar healed with exception over area as noted above with copious tan drainage. Wound edges matted with drainage, but able to approximate margins manually.  Left sided temporal craniotomy scar.   Given history likely infection for months, and early after bone flap replacement, I suspect bone flap not viable and/or with ostemyelitis and will need to be removed completely. Able to approximate wound with hand, so anticipate will be able to close primarily following bone flap removal. If not, consider local scalp flap for coverage. Any further attempt at cranioplasty I recommend to be delayed until wounds healed and infection  cleared. Patient also continues to smoke.  This was discussed with Dr. Christella Noa and he will plan operative debridement tomorrow.   Irene Limbo, MD [facility] [phone], pin [phone]

## 2015-12-27 NOTE — Progress Notes (Signed)
Patient's daughter at bedside are asking for drinking products for her and not the patient. RN verbalized that these products are only for our patients. Family are upset at this time. No other distress noted or concerns voice from pt at this time.  Ave Filter, RN

## 2015-12-27 NOTE — Progress Notes (Signed)
RT note- poor performance with inhalers, very sleepy however arousable, RN notified.

## 2015-12-28 ENCOUNTER — Encounter (HOSPITAL_COMMUNITY): Payer: Self-pay | Admitting: Certified Registered Nurse Anesthetist

## 2015-12-28 ENCOUNTER — Other Ambulatory Visit: Payer: Self-pay | Admitting: Neurosurgery

## 2015-12-28 ENCOUNTER — Inpatient Hospital Stay (HOSPITAL_COMMUNITY): Payer: Medicare Other | Admitting: Anesthesiology

## 2015-12-28 ENCOUNTER — Encounter (HOSPITAL_COMMUNITY)
Admission: AD | Disposition: A | Discharge status: Home Health | Payer: Self-pay | Source: Ambulatory Visit | Attending: Neurosurgery

## 2015-12-28 DIAGNOSIS — Z9889 Other specified postprocedural states: Secondary | ICD-10-CM

## 2015-12-28 HISTORY — PX: CRANIOTOMY: SHX93

## 2015-12-28 SURGERY — CRANIOTOMY HEMATOMA EVACUATION SUBDURAL
Anesthesia: General | Site: Head

## 2015-12-28 MED ORDER — MIDAZOLAM HCL 2 MG/2ML IJ SOLN
INTRAMUSCULAR | Status: AC
  Filled 2015-12-28: qty 2

## 2015-12-28 MED ORDER — ROCURONIUM BROMIDE 100 MG/10ML IV SOLN
INTRAVENOUS | Status: DC | PRN
  Administered 2015-12-28: 20 mg via INTRAVENOUS
  Administered 2015-12-28: 40 mg via INTRAVENOUS

## 2015-12-28 MED ORDER — FENTANYL CITRATE (PF) 100 MCG/2ML IJ SOLN
INTRAMUSCULAR | Status: AC
  Administered 2015-12-28: 50 ug via INTRAVENOUS
  Filled 2015-12-28: qty 2

## 2015-12-28 MED ORDER — SUGAMMADEX SODIUM 200 MG/2ML IV SOLN
INTRAVENOUS | Status: AC
  Filled 2015-12-28: qty 2

## 2015-12-28 MED ORDER — BACITRACIN ZINC 500 UNIT/GM EX OINT
TOPICAL_OINTMENT | CUTANEOUS | Status: DC | PRN
  Administered 2015-12-28: 1 via TOPICAL

## 2015-12-28 MED ORDER — ALBUMIN HUMAN 5 % IV SOLN
12.5000 g | Freq: Once | INTRAVENOUS | Status: AC
  Administered 2015-12-28: 12.5 g via INTRAVENOUS

## 2015-12-28 MED ORDER — ONDANSETRON HCL 4 MG/2ML IJ SOLN
INTRAMUSCULAR | Status: AC
  Filled 2015-12-28: qty 2

## 2015-12-28 MED ORDER — ALBUMIN HUMAN 5 % IV SOLN
INTRAVENOUS | Status: AC
Start: 2015-12-28 — End: 2015-12-28
  Administered 2015-12-28: 12.5 g via INTRAVENOUS
  Filled 2015-12-28: qty 250

## 2015-12-28 MED ORDER — THROMBIN 20000 UNITS EX SOLR
CUTANEOUS | Status: DC | PRN
  Administered 2015-12-28: 20 mL via TOPICAL

## 2015-12-28 MED ORDER — ROCURONIUM BROMIDE 50 MG/5ML IV SOLN
INTRAVENOUS | Status: AC
  Filled 2015-12-28: qty 1

## 2015-12-28 MED ORDER — MIDAZOLAM HCL 5 MG/5ML IJ SOLN
INTRAMUSCULAR | Status: DC | PRN
  Administered 2015-12-28: 2 mg via INTRAVENOUS

## 2015-12-28 MED ORDER — 0.9 % SODIUM CHLORIDE (POUR BTL) OPTIME
TOPICAL | Status: DC | PRN
  Administered 2015-12-28 (×3): 1000 mL

## 2015-12-28 MED ORDER — PROPOFOL 10 MG/ML IV BOLUS
INTRAVENOUS | Status: DC | PRN
  Administered 2015-12-28: 150 mg via INTRAVENOUS
  Administered 2015-12-28: 50 mg via INTRAVENOUS

## 2015-12-28 MED ORDER — PROPOFOL 10 MG/ML IV BOLUS
INTRAVENOUS | Status: AC
  Filled 2015-12-28: qty 20

## 2015-12-28 MED ORDER — PHENYLEPHRINE HCL 10 MG/ML IJ SOLN
INTRAMUSCULAR | Status: DC | PRN
  Administered 2015-12-28 (×2): 80 ug via INTRAVENOUS

## 2015-12-28 MED ORDER — NEOSTIGMINE METHYLSULFATE 5 MG/5ML IV SOSY
PREFILLED_SYRINGE | INTRAVENOUS | Status: AC
  Filled 2015-12-28: qty 5

## 2015-12-28 MED ORDER — FENTANYL CITRATE (PF) 250 MCG/5ML IJ SOLN
INTRAMUSCULAR | Status: AC
  Filled 2015-12-28: qty 5

## 2015-12-28 MED ORDER — METOCLOPRAMIDE HCL 5 MG/ML IJ SOLN: 10.0000 mg | Freq: Once | INTRAMUSCULAR | Status: DC | PRN

## 2015-12-28 MED ORDER — FENTANYL CITRATE (PF) 100 MCG/2ML IJ SOLN
25.0000 ug | INTRAMUSCULAR | Status: DC | PRN
  Administered 2015-12-28 – 2015-12-29 (×4): 50 ug via INTRAVENOUS
  Filled 2015-12-28 (×2): qty 2

## 2015-12-28 MED ORDER — PHENYLEPHRINE 40 MCG/ML (10ML) SYRINGE FOR IV PUSH (FOR BLOOD PRESSURE SUPPORT)
PREFILLED_SYRINGE | INTRAVENOUS | Status: AC
  Filled 2015-12-28: qty 10

## 2015-12-28 MED ORDER — FENTANYL CITRATE (PF) 100 MCG/2ML IJ SOLN
INTRAMUSCULAR | Status: DC | PRN
  Administered 2015-12-28 (×2): 50 ug via INTRAVENOUS
  Administered 2015-12-28: 100 ug via INTRAVENOUS
  Administered 2015-12-28: 50 ug via INTRAVENOUS

## 2015-12-28 MED ORDER — LACTATED RINGERS IV SOLN
INTRAVENOUS | Status: DC | PRN
  Administered 2015-12-28: 20:00:00 via INTRAVENOUS

## 2015-12-28 MED ORDER — SUGAMMADEX SODIUM 200 MG/2ML IV SOLN
INTRAVENOUS | Status: DC | PRN
  Administered 2015-12-28: 200 mg via INTRAVENOUS

## 2015-12-28 SURGICAL SUPPLY — 70 items
BANDAGE GAUZE 4  KLING STR (GAUZE/BANDAGES/DRESSINGS) ×3 IMPLANT
BENZOIN TINCTURE PRP APPL 2/3 (GAUZE/BANDAGES/DRESSINGS) IMPLANT
BLADE CLIPPER SURG (BLADE) ×3 IMPLANT
BLADE ULTRA TIP 2M (BLADE) ×3 IMPLANT
BNDG GAUZE ELAST 4 BULKY (GAUZE/BANDAGES/DRESSINGS) ×6 IMPLANT
BRUSH SCRUB EZ 1% IODOPHOR (MISCELLANEOUS) ×3 IMPLANT
BUR ACORN 6.0 PRECISION (BURR) ×2 IMPLANT
BUR ACORN 6.0MM PRECISION (BURR) ×1
BUR ADDG 1.1 (BURR) IMPLANT
BUR ADDG 1.1MM (BURR)
BUR MATCHSTICK NEURO 3.0 LAGG (BURR) IMPLANT
BUR SPIRAL ROUTER 2.3 (BUR) ×2 IMPLANT
BUR SPIRAL ROUTER 2.3MM (BUR) ×1
CANISTER SUCT 3000ML PPV (MISCELLANEOUS) ×9 IMPLANT
CLIP TI MEDIUM 6 (CLIP) IMPLANT
DRAIN SNY WOU 7FLT (WOUND CARE) IMPLANT
DRAPE C-ARM 42X72 X-RAY (DRAPES) ×3 IMPLANT
DRAPE NEUROLOGICAL W/INCISE (DRAPES) ×3 IMPLANT
DRAPE SURG 17X23 STRL (DRAPES) IMPLANT
DRAPE WARM FLUID 44X44 (DRAPE) ×3 IMPLANT
DURAPREP 6ML APPLICATOR 50/CS (WOUND CARE) ×3 IMPLANT
ELECT CAUTERY BLADE 6.4 (BLADE) ×3 IMPLANT
ELECT REM PT RETURN 9FT ADLT (ELECTROSURGICAL) ×3
ELECTRODE REM PT RTRN 9FT ADLT (ELECTROSURGICAL) ×1 IMPLANT
EVACUATOR 1/8 PVC DRAIN (DRAIN) IMPLANT
EVACUATOR SILICONE 100CC (DRAIN) IMPLANT
GAUZE SPONGE 4X4 12PLY STRL (GAUZE/BANDAGES/DRESSINGS) ×3 IMPLANT
GAUZE SPONGE 4X4 16PLY XRAY LF (GAUZE/BANDAGES/DRESSINGS) IMPLANT
GLOVE BIO SURGEON STRL SZ 6.5 (GLOVE) ×2 IMPLANT
GLOVE BIO SURGEON STRL SZ7 (GLOVE) ×6 IMPLANT
GLOVE BIO SURGEONS STRL SZ 6.5 (GLOVE) ×1
GLOVE ECLIPSE 6.5 STRL STRAW (GLOVE) ×6 IMPLANT
GLOVE EXAM NITRILE LRG STRL (GLOVE) IMPLANT
GLOVE EXAM NITRILE MD LF STRL (GLOVE) IMPLANT
GLOVE EXAM NITRILE XL STR (GLOVE) IMPLANT
GLOVE EXAM NITRILE XS STR PU (GLOVE) IMPLANT
GOWN STRL REUS W/ TWL LRG LVL3 (GOWN DISPOSABLE) ×2 IMPLANT
GOWN STRL REUS W/ TWL XL LVL3 (GOWN DISPOSABLE) IMPLANT
GOWN STRL REUS W/TWL 2XL LVL3 (GOWN DISPOSABLE) IMPLANT
GOWN STRL REUS W/TWL LRG LVL3 (GOWN DISPOSABLE) ×4
GOWN STRL REUS W/TWL XL LVL3 (GOWN DISPOSABLE)
HEMOSTAT SURGICEL 2X14 (HEMOSTASIS) IMPLANT
KIT BASIN OR (CUSTOM PROCEDURE TRAY) ×3 IMPLANT
KIT ROOM TURNOVER OR (KITS) ×3 IMPLANT
NEEDLE HYPO 25X1 1.5 SAFETY (NEEDLE) ×3 IMPLANT
NS IRRIG 1000ML POUR BTL (IV SOLUTION) ×9 IMPLANT
PACK CRANIOTOMY (CUSTOM PROCEDURE TRAY) ×3 IMPLANT
PATTIES SURGICAL .5 X.5 (GAUZE/BANDAGES/DRESSINGS) IMPLANT
PATTIES SURGICAL .5 X3 (DISPOSABLE) IMPLANT
PATTIES SURGICAL 1X1 (DISPOSABLE) IMPLANT
SPONGE NEURO XRAY DETECT 1X3 (DISPOSABLE) IMPLANT
SPONGE SURGIFOAM ABS GEL 100 (HEMOSTASIS) ×3 IMPLANT
STAPLER VISISTAT 35W (STAPLE) ×3 IMPLANT
SUT ETHILON 3 0 FSL (SUTURE) ×6 IMPLANT
SUT ETHILON 3 0 PS 1 (SUTURE) IMPLANT
SUT NURALON 4 0 TR CR/8 (SUTURE) ×3 IMPLANT
SUT PL GUT 3 0 FS 1 (SUTURE) IMPLANT
SUT PROLENE 0 CT 1 30 (SUTURE) ×6 IMPLANT
SUT STEEL 0 (SUTURE)
SUT STEEL 0 18XMFL TIE 17 (SUTURE) IMPLANT
SUT VIC AB 2-0 CT2 18 VCP726D (SUTURE) ×9 IMPLANT
SYR CONTROL 10ML LL (SYRINGE) ×3 IMPLANT
TAPE CLOTH 1X10 TAN NS (GAUZE/BANDAGES/DRESSINGS) ×3 IMPLANT
TOWEL OR 17X24 6PK STRL BLUE (TOWEL DISPOSABLE) ×3 IMPLANT
TOWEL OR 17X26 10 PK STRL BLUE (TOWEL DISPOSABLE) ×3 IMPLANT
TRAY FOLEY W/METER SILVER 16FR (SET/KITS/TRAYS/PACK) ×3 IMPLANT
TUBE CONNECTING 12'X1/4 (SUCTIONS) ×1
TUBE CONNECTING 12X1/4 (SUCTIONS) ×2 IMPLANT
UNDERPAD 30X30 INCONTINENT (UNDERPADS AND DIAPERS) ×3 IMPLANT
WATER STERILE IRR 1000ML POUR (IV SOLUTION) ×3 IMPLANT

## 2015-12-28 NOTE — Op Note (Signed)
12/26/2015 - 12/28/2015  11:12 PM  PATIENT:  [name]  60 y.o. female with a wound infection status post craniotomy in march 2017. To the OR for infected bone flap removal, wound debridement  PRE-OPERATIVE DIAGNOSIS:  Wound infection  POST-OPERATIVE DIAGNOSIS:  Wound infection  PROCEDURE:  Procedure(s): Craniectomy, wound debridement Primary wound closure  SURGEON: Surgeon(s): Ashok Pall, MD  ASSISTANTS:none  ANESTHESIA:   general  EBL:     BLOOD ADMINISTERED:none  CELL SAVER GIVEN:none  COUNT:per nursing  DRAINS: none   SPECIMEN:  Source of Specimen:  scalp, epidural space  DICTATION: FRANCISCA HIERRO was taken to the operating room, intubated, and placed under a general anesthetic without difficulty. She was positioned supine with her head on a dougnut .femaleHer head was shaved, prepped and draped in a sterile manner. I opened the existing incision line with a 10 blade, and placed Raney clips along the scalp edges. I reflected the scalp flap rostrally exposing the bone flap and skull. I retracted the scalp and removed the craniotomy flap along with the plates and screws. The craniotomy flap was sent to microbiology for culture. I debrided the scalp edges where the wound was dehisced removing the edges of the scalp. I irrigated ~2.5 liters of saline into the wound. The dura was intact. I then sent some epidural tissue and the scalp I removed to the lab for culture. I closed the wound approximating the scalp with prolene sutures, vicryl sutures, and nylon sutures. I used vertical mattress sutures in the dehisced portion in a single layer. All other areas were closed in two layers with galeal vicryl sutures and nylon or prolene in the scalp. I applied a sterile dressing. She was extubated and breathing on her own.   PLAN OF CARE: Admit to inpatient   PATIENT DISPOSITION:  PACU - hemodynamically stable.   Delay start of Pharmacological VTE agent (>24hrs) due to surgical blood  loss or risk of bleeding:  yes

## 2015-12-28 NOTE — Anesthesia Preprocedure Evaluation (Signed)
Anesthesia Evaluation  Patient identified by MRN, date of birth, ID band Patient awake    Reviewed: Allergy & Precautions, NPO status , Patient's Chart, lab work & pertinent test results  Airway Mallampati: II  TM Distance: >3 FB Neck ROM: Full    Dental  (+) Edentulous Upper, Edentulous Lower   Pulmonary Current Smoker,    breath sounds clear to auscultation       Cardiovascular hypertension,  Rhythm:Regular Rate:Normal     Neuro/Psych    GI/Hepatic   Endo/Other  diabetes  Renal/GU      Musculoskeletal   Abdominal (+) + obese,   Peds  Hematology   Anesthesia Other Findings   Reproductive/Obstetrics                             Anesthesia Physical Anesthesia Plan  ASA: III  Anesthesia Plan: General   Post-op Pain Management:    Induction: Intravenous  Airway Management Planned: Oral ETT  Additional Equipment:   Intra-op Plan:   Post-operative Plan: Extubation in OR  Informed Consent: I have reviewed the patients History and Physical, chart, labs and discussed the procedure including the risks, benefits and alternatives for the proposed anesthesia with the patient or authorized representative who has indicated his/her understanding and acceptance.     Plan Discussed with: CRNA and Anesthesiologist  Anesthesia Plan Comments:         Anesthesia Quick Evaluation

## 2015-12-28 NOTE — Progress Notes (Signed)
Wrap and gauze fell off of patient head.  While open, noted hardware showing from previous craniotomy. Changed dressing with sterile gauze and secured with conform and tape. Pt tol well. Encouraged pt not to touch area or scratch. Wendee Copp

## 2015-12-28 NOTE — Progress Notes (Signed)
Patient's wound covered and wrapped in gauze at the beginning of the shift. She continued to pick at it and pull the gauze off. The wound is deeper and draining. Hardware is now visible in the deepest part of the wound. Wound was rewrapped and patient was re-educated. She continues to ask for food and drink even though she is NPO for surgery. Monitoring closely. Hildagarde Holleran, Rande Brunt, RN

## 2015-12-28 NOTE — Progress Notes (Signed)
[facility] Progress Note Patient Name: [name] DOB: Aug 07, 1955 MRN: [id number]   Date of Service  12/28/2015  HPI/Events of Note  New admit to ICU post craniectomy & wound debridement for infection. Patient extubated & on nasal cannula. Primary service at bedside & has placed arterial line for BP monitoring. Patient w/ hypotension into 123XX123 systolic. Likely has some element hypovolemia from blood loss. Type & screen being sent & order for blood transfusion per primary service. Patient appears comfortable in bed.  eICU Interventions  Continue ICU level care & close monitoring per primary service.      Intervention Category Evaluation Type: New Patient Evaluation  Tera Partridge 12/28/2015, 11:51 PM

## 2015-12-28 NOTE — Transfer of Care (Signed)
Immediate Anesthesia Transfer of Care Note  Patient: [name]  Procedure(s) Performed: Procedure(s) with comments: Craniectomy for wound debridement (N/A) - Craniectomy for wound debridement  Patient Location: PACU and NICU  Anesthesia Type:General  Level of Consciousness: awake  Airway & Oxygen Therapy: Patient Spontanous Breathing and Patient connected to nasal cannula oxygen  Post-op Assessment: Report given to RN and Post -op Vital signs reviewed and stable  Post vital signs: Reviewed and stable  Last Vitals:  Filed Vitals:   12/28/15 1026 12/28/15 1749  BP: 103/37 116/78  Pulse: 83 85  Temp: 37 C 37.1 C  Resp: 19 19    Last Pain:  Filed Vitals:   12/28/15 1750  PainSc: Asleep      Patients Stated Pain Goal: 3 ([id number] [id number])  Complications: No apparent anesthesia complications

## 2015-12-28 NOTE — Anesthesia Procedure Notes (Signed)
Procedure Name: Intubation Date/Time: 12/28/2015 8:29 PM Performed by: Manus Gunning, Myiesha Edgar J Pre-anesthesia Checklist: Patient identified, Timeout performed, Emergency Drugs available, Suction available and Patient being monitored Patient Re-evaluated:Patient Re-evaluated prior to inductionOxygen Delivery Method: Circle system utilized Preoxygenation: Pre-oxygenation with 100% oxygen Intubation Type: IV induction Ventilation: Mask ventilation without difficulty Laryngoscope Size: Mac and 3 Tube type: Oral Tube size: 7.0 mm Number of attempts: 1 Placement Confirmation: ETT inserted through vocal cords under direct vision,  breath sounds checked- equal and bilateral and positive ETCO2 Secured at: 21 cm Tube secured with: Tape Dental Injury: Teeth and Oropharynx as per pre-operative assessment

## 2015-12-29 ENCOUNTER — Encounter (HOSPITAL_COMMUNITY): Payer: Self-pay | Admitting: Neurosurgery

## 2015-12-29 DIAGNOSIS — B9689 Other specified bacterial agents as the cause of diseases classified elsewhere: Secondary | ICD-10-CM

## 2015-12-29 DIAGNOSIS — T8131XD Disruption of external operation (surgical) wound, not elsewhere classified, subsequent encounter: Secondary | ICD-10-CM

## 2015-12-29 DIAGNOSIS — M8618 Other acute osteomyelitis, other site: Secondary | ICD-10-CM

## 2015-12-29 DIAGNOSIS — Z9889 Other specified postprocedural states: Secondary | ICD-10-CM

## 2015-12-29 DIAGNOSIS — Y838 Other surgical procedures as the cause of abnormal reaction of the patient, or of later complication, without mention of misadventure at the time of the procedure: Secondary | ICD-10-CM

## 2015-12-29 LAB — CBC
HEMATOCRIT: 22.9 % — AB (ref 36.0–46.0)
Hemoglobin: 6.5 g/dL — CL (ref 12.0–15.0)
MCH: 21.2 pg — ABNORMAL LOW (ref 26.0–34.0)
MCHC: 28.4 g/dL — ABNORMAL LOW (ref 30.0–36.0)
MCV: 74.8 fL — AB (ref 78.0–100.0)
Platelets: 128 10*3/uL — ABNORMAL LOW (ref 150–400)
RBC: 3.06 MIL/uL — ABNORMAL LOW (ref 3.87–5.11)
RDW: 21.2 % — AB (ref 11.5–15.5)
WBC: 4.3 10*3/uL (ref 4.0–10.5)

## 2015-12-29 LAB — PREPARE RBC (CROSSMATCH)

## 2015-12-29 LAB — GLUCOSE, CAPILLARY: Glucose-Capillary: 117 mg/dL — ABNORMAL HIGH (ref 65–99)

## 2015-12-29 LAB — MRSA PCR SCREENING: MRSA by PCR: POSITIVE — AB

## 2015-12-29 MED ORDER — SODIUM CHLORIDE 0.9 % IV SOLN: 10.0000 mL/h | Freq: Once | INTRAVENOUS | Status: DC

## 2015-12-29 MED ORDER — MOMETASONE FURO-FORMOTEROL FUM 200-5 MCG/ACT IN AERO: 2.0000 | INHALATION_SPRAY | Freq: Two times a day (BID) | RESPIRATORY_TRACT | Status: DC

## 2015-12-29 MED ORDER — MUPIROCIN 2 % EX OINT
1.0000 "application " | TOPICAL_OINTMENT | Freq: Two times a day (BID) | CUTANEOUS | Status: AC
  Administered 2015-12-29 – 2016-01-02 (×9): 1 via NASAL
  Filled 2015-12-29 (×3): qty 22

## 2015-12-29 MED ORDER — CHLORHEXIDINE GLUCONATE CLOTH 2 % EX PADS
6.0000 | MEDICATED_PAD | Freq: Every day | CUTANEOUS | Status: AC
  Administered 2015-12-29 – 2016-01-02 (×5): 6 via TOPICAL

## 2015-12-29 MED ORDER — NITROFURANTOIN MONOHYD MACRO 100 MG PO CAPS
100.0000 mg | ORAL_CAPSULE | Freq: Two times a day (BID) | ORAL | Status: DC
  Administered 2015-12-29 – 2016-01-05 (×14): 100 mg via ORAL
  Filled 2015-12-29 (×16): qty 1

## 2015-12-29 MED ORDER — ALBUTEROL SULFATE (2.5 MG/3ML) 0.083% IN NEBU
2.5000 mg | INHALATION_SOLUTION | Freq: Four times a day (QID) | RESPIRATORY_TRACT | Status: DC
  Administered 2015-12-29 – 2016-01-02 (×8): 2.5 mg via RESPIRATORY_TRACT
  Filled 2015-12-29 (×15): qty 3

## 2015-12-29 MED ORDER — PAROXETINE HCL 20 MG PO TABS
20.0000 mg | ORAL_TABLET | Freq: Every day | ORAL | Status: DC
  Administered 2015-12-29 – 2016-01-05 (×8): 20 mg via ORAL
  Filled 2015-12-29 (×8): qty 1

## 2015-12-29 MED ORDER — MORPHINE SULFATE (PF) 2 MG/ML IV SOLN
2.0000 mg | INTRAVENOUS | Status: DC | PRN
  Administered 2015-12-30 – 2016-01-05 (×3): 2 mg via INTRAVENOUS
  Filled 2015-12-29 (×3): qty 1

## 2015-12-29 MED ORDER — ALBUTEROL SULFATE (2.5 MG/3ML) 0.083% IN NEBU: 3.0000 mL | INHALATION_SOLUTION | Freq: Four times a day (QID) | RESPIRATORY_TRACT | Status: DC | PRN

## 2015-12-29 MED ORDER — HYDROXYZINE HCL 50 MG PO TABS
50.0000 mg | ORAL_TABLET | Freq: Three times a day (TID) | ORAL | Status: DC
  Administered 2015-12-29 – 2016-01-05 (×18): 50 mg via ORAL
  Filled 2015-12-29 (×24): qty 1

## 2015-12-29 MED ORDER — CETYLPYRIDINIUM CHLORIDE 0.05 % MT LIQD
7.0000 mL | Freq: Two times a day (BID) | OROMUCOSAL | Status: DC
  Administered 2015-12-29 – 2016-01-05 (×15): 7 mL via OROMUCOSAL

## 2015-12-29 NOTE — Progress Notes (Signed)
   12/29/15 1000  Clinical Encounter Type  Visited With Patient and family together  Visit Type Follow-up;Spiritual support  Referral From Chaplain  Consult/Referral To Chaplain  Spiritual Encounters  Spiritual Needs Prayer  Stress Factors  Patient Stress Factors Exhausted;Health changes;Loss of control;Major life changes  Family Stress Factors Health changes;Major life changes  Chaplain consult follow up after surgical procedure, provided spiritual presence, family/caregiver support, and prayer.  Advised that continued support services are available as requested 24/7.

## 2015-12-29 NOTE — Clinical Documentation Improvement (Signed)
Neuro Surgery  Please further clarify procedure documentation in the medical record of "debridement" and document findings in next progress note, addended Op Note. Thank you.   Type - excisional, non-excisional  Depth of - skin, subcutaneous tissue/fascia, muscle, joint, bone, etc,  Other condition  Clinically Undetermined  Supporting Information: :   Please exercise your independent, professional judgment when responding. A specific answer is not anticipated or expected.  Thank You, Zoila Shutter RN, BSN, [place] [phone]; Cell: [phone]

## 2015-12-29 NOTE — Consult Note (Signed)
[facility] unable to respond in time frame requested.  Please contact if additional support requested.

## 2015-12-29 NOTE — Progress Notes (Signed)
Pharmacy Antibiotic Note  [name] is a 60 y.o. female admitted on 12/26/2015 with wound infection- exposed skull.  Pharmacy has been consulted for vancomycin dosing.  Patient had subdural hematoma s/p evacuation, then cranioplasty with bone flap replacement on 3/10. She had been calling neurosurgery office for ~2 months with complaints of drainage, however did not go in to the office until 6/19. Scalp defect with visible skull and hardware.  Day #4 of abx for wound infection with exposed skull. Afebrile, WBC 4.3. Missed vanc dose last night since she was in the OR. Renal: SCr 0.84, normalized CrCl ~27ml/min  Plan: Continue vancomycin 750mg  IV q12h  Monitor clinical picture, renal function, VT prn F/U C&S, abx deescalation / LOT  Height: 5\' 8"  (172.7 cm) Weight: 253 lb 15.5 oz (115.2 kg) IBW/kg (Calculated) : 63.9  Temp (24hrs), Avg:98.4 F (36.9 C), Min:97.5 F (36.4 C), Max:98.7 F (37.1 C)   Recent Labs Lab 12/26/15 1341 12/29/15 0149  WBC 3.7* 4.3  CREATININE 0.84  --     Estimated Creatinine Clearance: 97.3 mL/min (by C-G formula based on Cr of 0.84).    Allergies  Allergen Reactions  . Ibuprofen Other (See Comments)    Pt states that she is unable to take this medication because of her liver.    . Sulfa Antibiotics Hives  . Amoxicillin Itching, Rash and Other (See Comments)    Has patient had a PCN reaction causing immediate rash, facial/tongue/throat swelling, SOB or lightheadedness with hypotension: No Has patient had a PCN reaction causing severe rash involving mucus membranes or skin necrosis: No Has patient had a PCN reaction that required hospitalization No Has patient had a PCN reaction occurring within the last 10 years: Yes If all of the above answers are "NO", then may proceed with Cephalosporin use.  . Chocolate Rash  . Ciprofloxacin Itching and Rash  . Dilaudid [Hydromorphone Hcl] Itching  . Fentanyl Rash  . Hydromorphone Itching  . Metformin  Nausea And Vomiting  . Penicillins Itching, Rash and Other (See Comments)    Has patient had a PCN reaction causing immediate rash, facial/tongue/throat swelling, SOB or lightheadedness with hypotension: No Has patient had a PCN reaction causing severe rash involving mucus membranes or skin necrosis: No Has patient had a PCN reaction that required hospitalization No Has patient had a PCN reaction occurring within the last 10 years: Yes If all of the above answers are "NO", then may proceed with Cephalosporin use.  . Strawberry Extract Rash  . Zofran [Ondansetron Hcl] Itching and Nausea And Vomiting    Antimicrobials this admission: Vancomycin 6/20 >>  Dose adjustments this admission: n/a  Microbiology results: None ordered at this time  Thank you for allowing pharmacy to be a part of this patient's care.  Lauren D. Bajbus, PharmD, BCPS Clinical Pharmacist Pager: [phone] 12/29/2015 7:24 AM

## 2015-12-29 NOTE — Consult Note (Signed)
[facility] for Infectious Disease  Total days of antibiotics 2        Day 2 vanco               Reason for Consult: bone flap infection   Referring Physician: cabbell  Active Problems:   Wound infection after surgery   Status post craniectomy    HPI: [name] is a 60 y.o. female with multiple co-morbidities, found to have right panhemispheric subdural hematoma evacuated on 2/18 with bone flap removed and temporarily imbedded in abdomen. Her complication from the surgery included having parotitis where she was initially on vancomycin and discharged on clindamycin. She was also given cephalexin, metronidazole and fluconazole on discharge. She was readmitted on 3/10 for cranioplasty which she did well,again still had parotitis, and given a 2nd course of clindamycin. She was readmitted to the hsoptial from 4/16-4/29 for sepsis of urinary source, multiple organisims isolated on culture including vre, psa. She reports having wound drainage from her scalp for the last 3-4 wk per her daughter. When see by dr. Christella Noa, she had scalp, hardware exposure concerning for bone flap infection. She was admitted to the hospital, wound described as Open scalp wound approximately 8 x 2.5 x 2.5 with 2 cm undermining through out. Purulent tan drainage. Visible bone and screws .it is unclear if patient was on oral abtx prior to surgery. cx results at 24hr no growth to date. She was started on vancomycin.  Past Medical History  Diagnosis Date  . Emphysema of lung ([place])   . Depression   . Diverticulitis   . Chronic bronchitis ([place])   . Malignant brain tumor ([place]) 2007  . GERD (gastroesophageal reflux disease)   . Allergy   . Hepatitis C   . Hypertension   . Chronic kidney disease   . Migraines   . Urine incontinence   . Seizures ([place])   . Stroke [facility]) 2007    during brain surgery  . Pancreatitis, alcoholic 0000000  . Rectovaginal fistula   . H/O ETOH abuse     Sober since 2009  . H/O drug  abuse     Clean since 2009  . Pancreatic ascites   . Cirrhosis ([place])   . Diabetes mellitus without complication ([place])     Allergies:  Allergies  Allergen Reactions  . Ibuprofen Other (See Comments)    Pt states that she is unable to take this medication because of her liver.    . Sulfa Antibiotics Hives  . Amoxicillin Itching, Rash and Other (See Comments)    Has patient had a PCN reaction causing immediate rash, facial/tongue/throat swelling, SOB or lightheadedness with hypotension: No Has patient had a PCN reaction causing severe rash involving mucus membranes or skin necrosis: No Has patient had a PCN reaction that required hospitalization No Has patient had a PCN reaction occurring within the last 10 years: Yes If all of the above answers are "NO", then may proceed with Cephalosporin use.  . Chocolate Rash  . Ciprofloxacin Itching and Rash  . Dilaudid [Hydromorphone Hcl] Itching  . Fentanyl Rash  . Hydromorphone Itching  . Metformin Nausea And Vomiting  . Penicillins Itching, Rash and Other (See Comments)    Has patient had a PCN reaction causing immediate rash, facial/tongue/throat swelling, SOB or lightheadedness with hypotension: No Has patient had a PCN reaction causing severe rash involving mucus membranes or skin necrosis: No Has patient had a PCN reaction that required hospitalization No Has patient  had a PCN reaction occurring within the last 10 years: Yes If all of the above answers are "NO", then may proceed with Cephalosporin use.  . Strawberry Extract Rash  . Zofran [Ondansetron Hcl] Itching and Nausea And Vomiting     MEDICATIONS: . sodium chloride  10 mL/hr Intravenous Once  . albuterol  2.5 mg Nebulization QID  . amitriptyline  50 mg Oral QHS  . antiseptic oral rinse  7 mL Mouth Rinse BID  . Chlorhexidine Gluconate Cloth  6 each Topical Q0600  . docusate sodium  100 mg Oral BID  . gabapentin  300 mg Oral TID  . heparin  5,000 Units Subcutaneous Q8H  .  hydrOXYzine  50 mg Oral TID  . levETIRAcetam  500 mg Oral BID  . lipase/protease/amylase  24,000 Units Oral TID WC  . metoprolol succinate  25 mg Oral Daily  . mometasone-formoterol  2 puff Inhalation BID  . mupirocin ointment  1 application Nasal BID  . nicotine  14 mg Transdermal Q24H  . nitrofurantoin (macrocrystal-monohydrate)  100 mg Oral BID  . ondansetron  4 mg Oral TID  . oxyCODONE  15 mg Oral Q12H  . pantoprazole  40 mg Oral Daily  . PARoxetine  20 mg Oral Daily  . pravastatin  10 mg Oral q1800  . sertraline  50 mg Oral Daily  . sodium chloride flush  3 mL Intravenous Q12H  . spironolactone  50 mg Oral BID  . tiotropium  18 mcg Inhalation Daily  . vancomycin  750 mg Intravenous Q12H  . zolpidem  10 mg Oral QHS    Social History  Substance Use Topics  . Smoking status: Light Tobacco Smoker -- 0.50 packs/day    Types: Cigarettes  . Smokeless tobacco: Never Used     Comment: cutting back  . Alcohol Use: 2.4 oz/week    4 Cans of beer, 0 Standard drinks or equivalent per week    Family History  Problem Relation Age of Onset  . Hypertension Mother   . Heart disease Father   . Diabetes Father   . Cancer Sister     brain  . Cancer Grandchild 8    brain tumor    Review of Systems -  Review of Systems  Constitutional: Negative for fever, chills, diaphoresis, activity change, appetite change, fatigue and unexpected weight change.  HENT: Negative for congestion, sore throat, rhinorrhea, sneezing, trouble swallowing and sinus pressure.  Eyes: Negative for photophobia and visual disturbance.  Respiratory: Negative for cough, chest tightness, shortness of breath, wheezing and stridor.  Cardiovascular: Negative for chest pain, palpitations and leg swelling.  Gastrointestinal: Negative for nausea, vomiting, abdominal pain, diarrhea, constipation, blood in stool, abdominal distention and anal bleeding.  Genitourinary: Negative for dysuria, hematuria, flank pain and difficulty  urinating.  Musculoskeletal: Negative for myalgias, back pain, joint swelling, arthralgias and gait problem.  Skin: Negative for color change, pallor, rash and wound.  Neurological: + headache. Negative for dizziness, tremors, weakness and light-headedness.  Hematological: Negative for adenopathy. Does not bruise/bleed easily.  Psychiatric/Behavioral: Negative for behavioral problems, confusion, sleep disturbance, dysphoric mood, decreased concentration and agitation.    OBJECTIVE: Temp:  [97.5 F (36.4 C)-98.9 F (37.2 C)] 98.9 F (37.2 C) (06/23 0740) Pulse Rate:  [85-104] 95 (06/23 1100) Resp:  [11-31] 22 (06/23 1100) BP: (73-116)/(43-84) 104/65 mmHg (06/23 1100) SpO2:  [86 %-100 %] 94 % (06/23 1100) Arterial Line BP: (78-135)/(44-64) 101/54 mmHg (06/23 1100) Weight:  [253 lb 15.5 oz (115.2 kg)]  253 lb 15.5 oz (115.2 kg) (06/23 0030) Physical Exam  Constitutional:  oriented to person, still somewhat soloment from pain meds. appears well-developed and well-nourished. No distress.  HENT: [place]/AT, PERRLA, no scleral icterus. Head is wrapped Mouth/Throat: Oropharynx is clear and moist. No oropharyngeal exudate.  Cardiovascular: Normal rate, regular rhythm and normal heart sounds. Exam reveals no gallop and no friction rub.  No murmur heard.  Pulmonary/Chest: Effort normal and breath sounds normal. No respiratory distress.  has no wheezes.  Neck = supple, no nuchal rigidity Abdominal: Soft. Bowel sounds are normal.  exhibits no distension. There is no tenderness.  Lymphadenopathy: no cervical adenopathy. No axillary adenopathy Neurological: alert and oriented to person, place, and time.  Skin: Skin is warm and dry. No rash noted. No erythema.  Psychiatric: a normal mood and affect.  behavior is normal.   LABS: Results for orders placed or performed during the hospital encounter of 12/26/15 (from the past 48 hour(s))  Glucose, capillary     Status: Abnormal   Collection Time: 12/28/15  11:15 PM  Result Value Ref Range   Glucose-Capillary 117 (H) 65 - 99 mg/dL  Aerobic Culture (superficial specimen)     Status: None (Preliminary result)   Collection Time: 12/28/15 11:56 PM  Result Value Ref Range   Specimen Description WOUND    Special Requests OPENED SCALP WOUND PREPROCEDURE    Gram Stain PENDING    Culture PENDING    Report Status PENDING   Type and screen [place]     Status: None (Preliminary result)   Collection Time: 12/28/15 11:58 PM  Result Value Ref Range   ABO/RH(D) A POS    Antibody Screen POS    Sample Expiration 12/31/2015    DAT, IgG NEG    Antibody Identification ANTI S ANTI E    PT AG Type NEGATIVE FOR E ANTIGEN NEGATIVE FOR S ANTIGEN    Unit Number [id number]    Blood Component Type RED CELLS,LR    Unit division 00    Status of Unit ISSUED    Transfusion Status OK TO TRANSFUSE    Crossmatch Result COMPATIBLE    Donor AG Type NEGATIVE FOR S ANTIGEN NEGATIVE FOR E ANTIGEN    Unit Number [id number]    Blood Component Type RED CELLS,LR    Unit division 00    Status of Unit ISSUED    Donor AG Type NEGATIVE FOR E ANTIGEN NEGATIVE FOR S ANTIGEN    Transfusion Status OK TO TRANSFUSE    Crossmatch Result COMPATIBLE    Unit Number [id number]    Blood Component Type RED CELLS,LR    Unit division 00    Status of Unit ALLOCATED    Donor AG Type NEGATIVE FOR E ANTIGEN NEGATIVE FOR S ANTIGEN    Transfusion Status OK TO TRANSFUSE    Crossmatch Result COMPATIBLE   Prepare RBC (crossmatch)     Status: None   Collection Time: 12/29/15 12:24 AM  Result Value Ref Range   Order Confirmation ORDER PROCESSED BY BLOOD BANK   MRSA PCR Screening     Status: Abnormal   Collection Time: 12/29/15 12:41 AM  Result Value Ref Range   MRSA by PCR POSITIVE (A) NEGATIVE    Comment:        The GeneXpert MRSA Assay (FDA approved for NASAL specimens only), is one component of a comprehensive MRSA colonization surveillance program. It is  not intended to diagnose MRSA infection nor to guide or monitor  treatment for MRSA infections. RESULT CALLED TO, READ BACK BY AND VERIFIED WITH: ELLIE FRAZIER,RN @0639  12/29/15 MKELLY   CBC     Status: Abnormal   Collection Time: 12/29/15  1:49 AM  Result Value Ref Range   WBC 4.3 4.0 - 10.5 K/uL   RBC 3.06 (L) 3.87 - 5.11 MIL/uL   Hemoglobin 6.5 (LL) 12.0 - 15.0 g/dL    Comment: REPEATED TO VERIFY CRITICAL RESULT CALLED TO, READ BACK BY AND VERIFIED WITH: A.NI,RN 0202 12/29/15 M.CAMPBELL    HCT 22.9 (L) 36.0 - 46.0 %   MCV 74.8 (L) 78.0 - 100.0 fL   MCH 21.2 (L) 26.0 - 34.0 pg   MCHC 28.4 (L) 30.0 - 36.0 g/dL   RDW 21.2 (H) 11.5 - 15.5 %   Platelets 128 (L) 150 - 400 K/uL  Prepare RBC     Status: None   Collection Time: 12/29/15  2:52 AM  Result Value Ref Range   Order Confirmation BB SAMPLE OR UNITS ALREADY AVAILABLE     MICRO: 6/22 blood cx pending 6/22 or cx pending mrsa colonized ua clean  IMAGING: No results found.   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 Please wait 48hr before placing picc line  Hx of hep c= treated  Caren Griffins B. [facility] for Infectious Diseases [phone]

## 2015-12-29 NOTE — Progress Notes (Signed)
Patient ID: [name], female   DOB: 09-30-1955, 60 y.o.   MRN: [id number] BP 101/56 mmHg  Pulse 89  Temp(Src) 100.3 F (37.9 C) (Oral)  Resp 19  Ht 5\' 8"  (1.727 m)  Wt 115.2 kg (253 lb 15.5 oz)  BMI 38.62 kg/m2  SpO2 100% Alert and oriented x 4  Wound dressing dry and intact Appreciate ID assistance Will continue vancomycin.

## 2015-12-29 NOTE — Clinical Documentation Improvement (Signed)
Neuro Surgery  Based on the clinical findings below, patient was hypotensive likely from hypovolemia and blood loss (6/22 progress note by Dr. Ashok Cordia). Please document any associated diagnoses/conditions the patient has or may have.   Acute Blood Loss Anemia  Other type of Anemia  Other Cause  Clinically Undetermined  Supporting Information:  H&H dropped from 8.7/30  To   6.5/23  Transfused 1 unit of PRBC's   Please exercise your independent, professional judgment when responding. A specific answer is not anticipated or expected.  Thank You, Zoila Shutter RN, BSN, [place] [phone]

## 2015-12-30 LAB — CBC WITH DIFFERENTIAL/PLATELET
Basophils Absolute: 0 10*3/uL (ref 0.0–0.1)
Basophils Relative: 0 %
EOS ABS: 0.2 10*3/uL (ref 0.0–0.7)
EOS PCT: 3 %
HCT: 26.6 % — ABNORMAL LOW (ref 36.0–46.0)
Hemoglobin: 7.9 g/dL — ABNORMAL LOW (ref 12.0–15.0)
LYMPHS ABS: 2.1 10*3/uL (ref 0.7–4.0)
LYMPHS PCT: 34 %
MCH: 23 pg — AB (ref 26.0–34.0)
MCHC: 29.7 g/dL — AB (ref 30.0–36.0)
MCV: 77.3 fL — AB (ref 78.0–100.0)
MONO ABS: 0.5 10*3/uL (ref 0.1–1.0)
MONOS PCT: 8 %
Neutro Abs: 3.5 10*3/uL (ref 1.7–7.7)
Neutrophils Relative %: 55 %
PLATELETS: 127 10*3/uL — AB (ref 150–400)
RBC: 3.44 MIL/uL — ABNORMAL LOW (ref 3.87–5.11)
RDW: 20.8 % — ABNORMAL HIGH (ref 11.5–15.5)
WBC: 6.3 10*3/uL (ref 4.0–10.5)

## 2015-12-30 LAB — BASIC METABOLIC PANEL
Anion gap: 7 (ref 5–15)
BUN: 5 mg/dL — AB (ref 6–20)
CHLORIDE: 106 mmol/L (ref 101–111)
CO2: 23 mmol/L (ref 22–32)
CREATININE: 0.8 mg/dL (ref 0.44–1.00)
Calcium: 8.6 mg/dL — ABNORMAL LOW (ref 8.9–10.3)
GFR calc non Af Amer: >60 mL/min (ref >60)
Glucose, Bld: 82 mg/dL (ref 65–99)
Potassium: 3.8 mmol/L (ref 3.5–5.1)
SODIUM: 136 mmol/L (ref 135–145)

## 2015-12-30 MED ORDER — VANCOMYCIN HCL IN DEXTROSE 750-5 MG/150ML-% IV SOLN
750.0000 mg | Freq: Three times a day (TID) | INTRAVENOUS | Status: DC
  Administered 2015-12-30 – 2016-01-05 (×17): 750 mg via INTRAVENOUS
  Filled 2015-12-30 (×21): qty 150

## 2015-12-30 MED ORDER — SODIUM CHLORIDE 0.9 % IV SOLN
500.0000 mg | Freq: Four times a day (QID) | INTRAVENOUS | Status: DC
  Administered 2015-12-30 – 2016-01-05 (×25): 500 mg via INTRAVENOUS
  Filled 2015-12-30 (×29): qty 500

## 2015-12-30 NOTE — Progress Notes (Signed)
Patient ID: Corinne Ports, female   DOB: 1955/07/23, 60 y.o.   MRN: [id number]         [facility] for Infectious Disease    Date of Admission:  12/26/2015           Day 5 vancomycin  Operative Gram stain's reveal gram-positive cocci in clusters and gram-positive rods in both bone and scalp specimens. Cultures are negative at 24 hours. I will add gram-negative and anaerobic coverage with imipenam and continue vancomycin pending final culture results.         Michel Bickers, MD [facility] for [facility] [phone] pager   [phone] cell 07/11/2015, 1:32 PM

## 2015-12-30 NOTE — Progress Notes (Signed)
No acute events C/o headahce Stable Continue abx Picc line in 2 days

## 2015-12-30 NOTE — Progress Notes (Signed)
Pharmacy Antibiotic Note  [name] is a 60 y.o. female admitted on 12/26/2015 with wound infection- exposed skull.  Pharmacy has been consulted for vancomycin and imipenem dosing.  Patient had subdural hematoma s/p evacuation, then cranioplasty with bone flap replacement on 3/10. She had been calling neurosurgery office for ~2 months with complaints of drainage, however did not go in to the office until 6/19. Scalp defect with visible skull and hardware.   Day #5 of abx for wound infection with exposed skull. Afebrile, WBC 4.3. Missed vanc dose last night since she was in the OR. Renal: SCr 0.84, normalized CrCl ~23ml/min. Primaxin today per ID for gram-negative and anaerobic coverage. Will increase vancomycin today with GPCs in skull culture  Plan: Increase vancomycin to 750mg  IV Q8h  Start imipenem 500mg  IV Q6h per ID  Monitor clinical picture, renal function, VT (on 6/25) F/U C&S, renal function, abx deescalation / LOT  Height: 5\' 8"  (172.7 cm) Weight: 253 lb 15.5 oz (115.2 kg) IBW/kg (Calculated) : 63.9  Temp (24hrs), Avg:99.5 F (37.5 C), Min:98.2 F (36.8 C), Max:100.4 F (38 C)   Recent Labs Lab 12/26/15 1341 12/29/15 0149 12/30/15 0751 12/30/15 0930  WBC 3.7* 4.3  --  6.3  CREATININE 0.84  --  0.80  --     Estimated Creatinine Clearance: 102.1 mL/min (by C-G formula based on Cr of 0.8).    Allergies  Allergen Reactions  . Ibuprofen Other (See Comments)    Pt states that she is unable to take this medication because of her liver.    . Sulfa Antibiotics Hives  . Amoxicillin Itching, Rash and Other (See Comments)    Has patient had a PCN reaction causing immediate rash, facial/tongue/throat swelling, SOB or lightheadedness with hypotension: No Has patient had a PCN reaction causing severe rash involving mucus membranes or skin necrosis: No Has patient had a PCN reaction that required hospitalization No Has patient had a PCN reaction occurring within the last 10  years: Yes If all of the above answers are "NO", then may proceed with Cephalosporin use.  . Chocolate Rash  . Ciprofloxacin Itching and Rash  . Dilaudid [Hydromorphone Hcl] Itching  . Fentanyl Rash  . Hydromorphone Itching  . Metformin Nausea And Vomiting  . Penicillins Itching, Rash and Other (See Comments)    Has patient had a PCN reaction causing immediate rash, facial/tongue/throat swelling, SOB or lightheadedness with hypotension: No Has patient had a PCN reaction causing severe rash involving mucus membranes or skin necrosis: No Has patient had a PCN reaction that required hospitalization No Has patient had a PCN reaction occurring within the last 10 years: Yes If all of the above answers are "NO", then may proceed with Cephalosporin use.  . Strawberry Extract Rash  . Zofran [Ondansetron Hcl] Itching and Nausea And Vomiting    Antimicrobials this admission: Vancomycin 6/20 >> (plan to treat as osteo x6 weeks per ID)  Nitrofurantoin 6/23>> (started by neuro)  Primaxin 6/24>>  Dose adjustments this admission: 6/24: Vancomycin to 750mg  IV Q8 hours (from Q12)  Microbiology results: MRSA PCR positive  Fungal cx 6/22 > sent  6/22: Deep Wound cx > Few GPCs in clusters 2/2, rare GPR in 1/2  Thank you for allowing pharmacy to be a part of this patient's care.  Jaydyn Menon C. Lennox Grumbles, PharmD Pharmacy Resident  Pager: [phone] 12/30/2015 10:43 AM

## 2015-12-30 NOTE — Progress Notes (Signed)
Dr. Christella Noa answering service paged regarding patients BP - pt denies dizziness, lightheadedness.

## 2015-12-31 LAB — BASIC METABOLIC PANEL
ANION GAP: 5 (ref 5–15)
BUN: 6 mg/dL (ref 6–20)
CHLORIDE: 111 mmol/L (ref 101–111)
CO2: 24 mmol/L (ref 22–32)
Calcium: 8.6 mg/dL — ABNORMAL LOW (ref 8.9–10.3)
Creatinine, Ser: 0.77 mg/dL (ref 0.44–1.00)
GFR calc Af Amer: >60 mL/min (ref >60)
Glucose, Bld: 97 mg/dL (ref 65–99)
POTASSIUM: 4.4 mmol/L (ref 3.5–5.1)
SODIUM: 140 mmol/L (ref 135–145)

## 2015-12-31 LAB — AEROBIC CULTURE  (SUPERFICIAL SPECIMEN)

## 2015-12-31 LAB — AEROBIC CULTURE W GRAM STAIN (SUPERFICIAL SPECIMEN)

## 2015-12-31 LAB — VANCOMYCIN, TROUGH: VANCOMYCIN TR: 16 ug/mL (ref 10.0–20.0)

## 2015-12-31 IMAGING — CT CT ABD-PELV W/ CM
2 of 5 series · 16 of 46 positions shown, 18 images · IV contrast (isovue)
Comparison: November 24, 2013

CLINICAL DATA: Lower abdominal pain; history of pancreatitis and
cirrhosis

EXAM:
CT ABDOMEN AND PELVIS WITH CONTRAST
TECHNIQUE: Multidetector CT imaging of the abdomen and pelvis was performed
using the standard protocol following bolus administration of
intravenous contrast. Oral contrast was also administered.
CONTRAST:  80 mL Isovue 300 nonionic

[Series 2: routine abd pel with · axial · 0.95mm/px · z∈[-965,-530]mm · 13 of 99 slices shown, 15 images]
[im 6/99  soft-tissue]
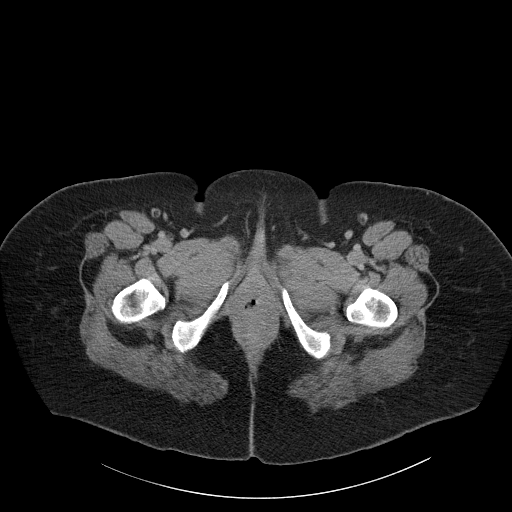
[im 6/99  bone]
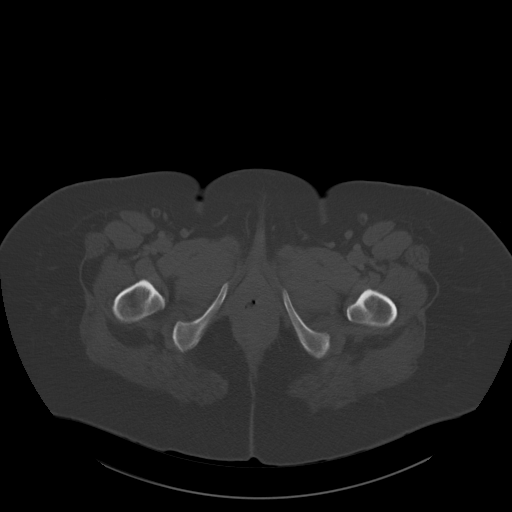
[im 16/99  soft-tissue]
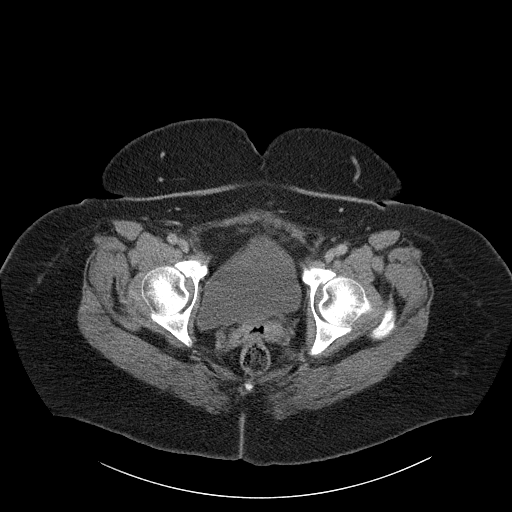
[im 21/99  soft-tissue]
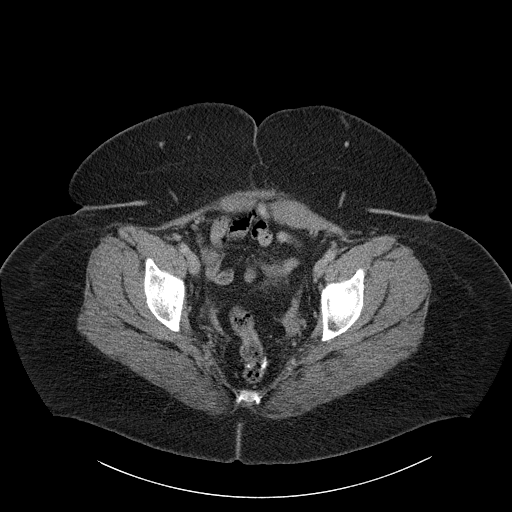
[im 26/99  soft-tissue]
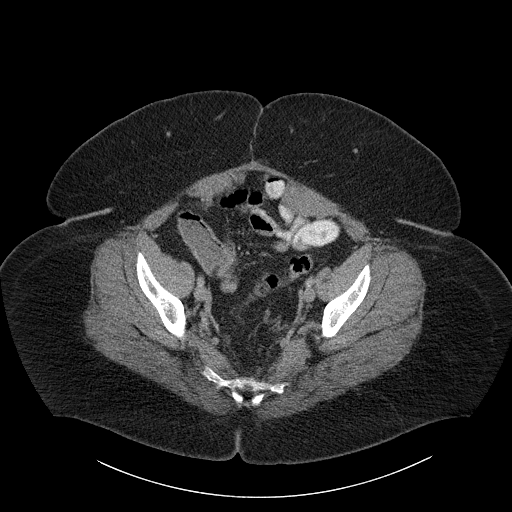
[im 37/99  soft-tissue]
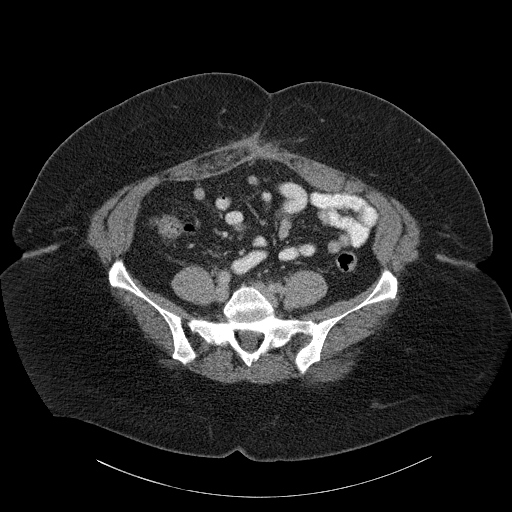
[im 42/99  soft-tissue]
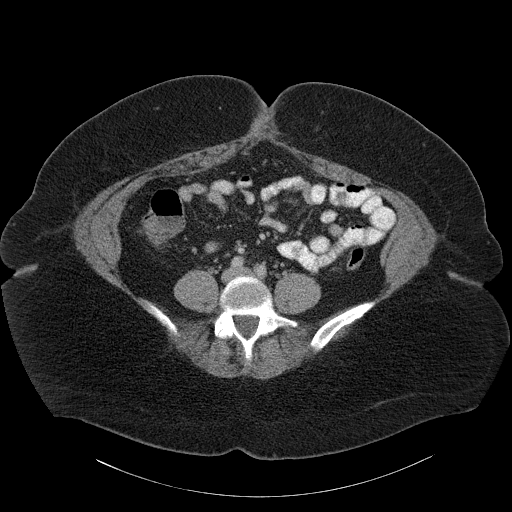
[im 52/99  soft-tissue]
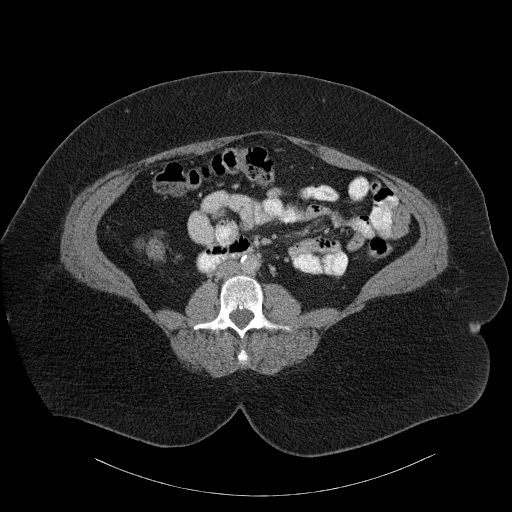
[im 57/99  soft-tissue]
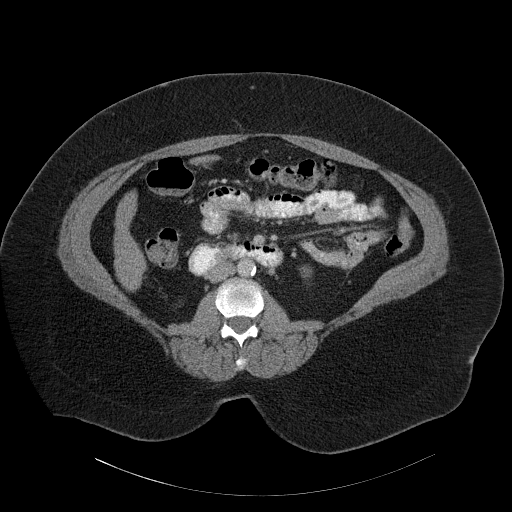
[im 62/99  soft-tissue]
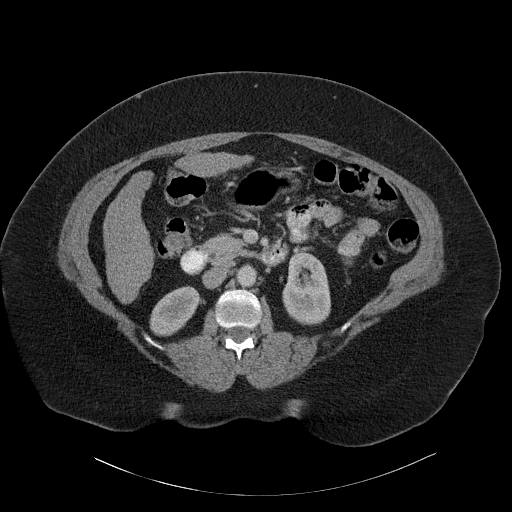
[im 62/99  bone]
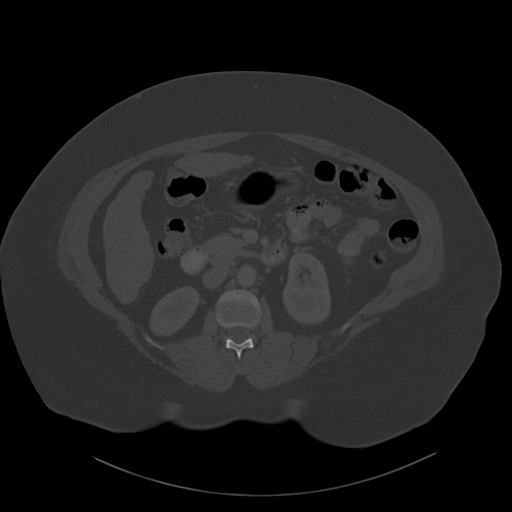
[im 73/99  soft-tissue]
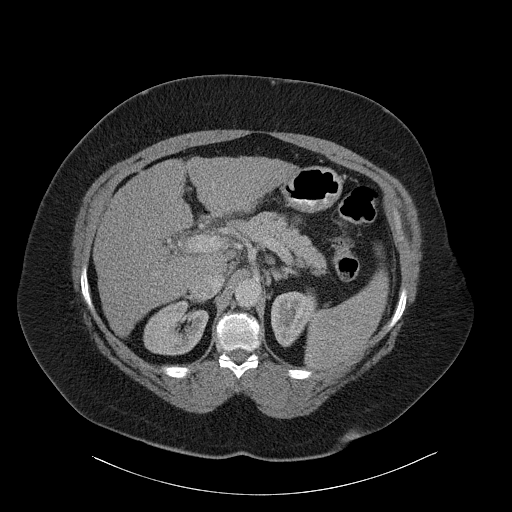
[im 78/99  soft-tissue]
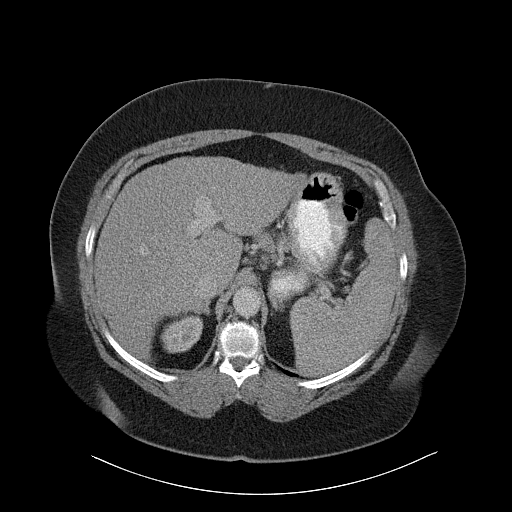
[im 83/99  soft-tissue]
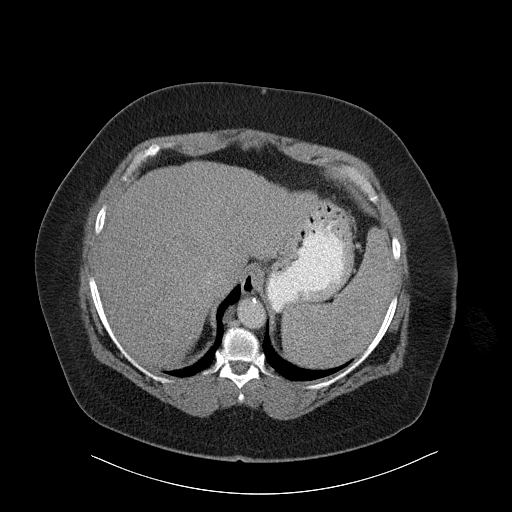
[im 93/99  soft-tissue]
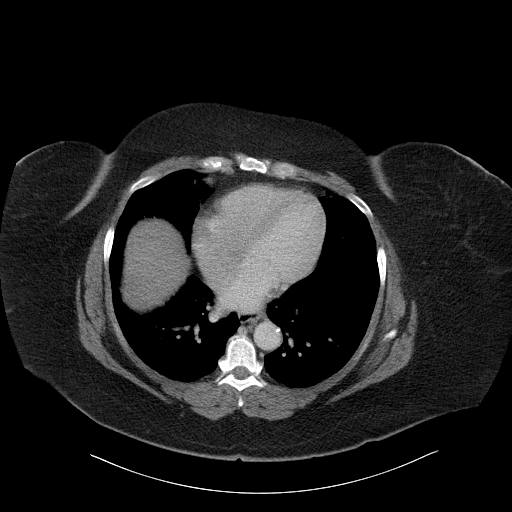

[Series 5: cor routine abd pel with · coronal · 0.95mm/px · 3 of 179 slices shown]
[im 60/179  soft-tissue]
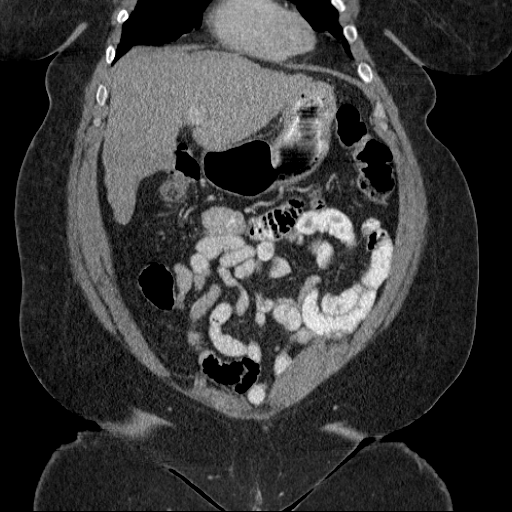
[im 80/179  soft-tissue]
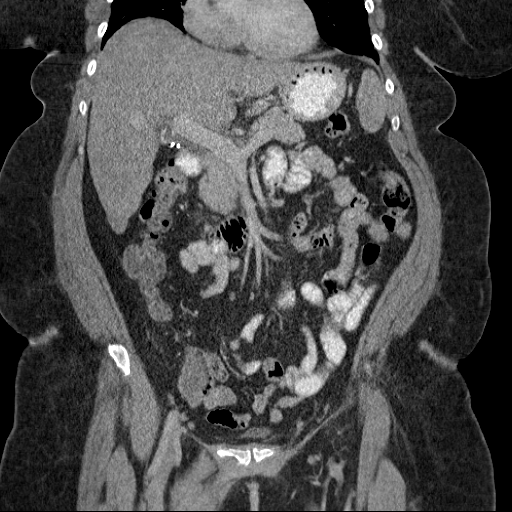
[im 99/179  soft-tissue]
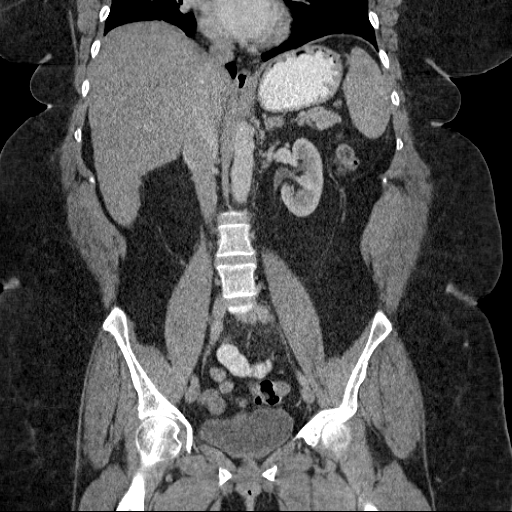

[16 of 46 positions shown; findings below may reference images not displayed]

FINDINGS: There is mild bibasilar lung atelectatic change. Lung bases are
otherwise clear.

Liver is prominent, measuring 20.7 cm in length. The liver contour
is nodular with prominence of the caudate lobe. This appearance is
consistent with hepatic cirrhosis and appears essentially stable
compared to recent prior study. There appears to be some underlying
fatty change in the liver. No focal liver lesions are identified.
Gallbladder is absent. The common bile duct appears upper normal in
size, stable.

Spleen is enlarged measuring 16.5 by 12.0 by 8.8 cm. No focal
splenic lesions are identified. Pancreas currently appears normal
without mass or inflammatory focus. There is no pancreatic duct
dilatation.

Right adrenal appears normal. There is a stable left adrenal mass
measuring 1.7 x 1.6 cm.

Kidneys bilaterally show no evidence of mass or hydronephrosis on
either side. There is no renal or ureteral calculus on either side.

There is a small ventral hernia containing only fat. There is stable
scarring in the anterior abdominal wall.

In the pelvis, the urinary bladder is midline with normal wall
thickness. There is no pelvic mass or fluid collection. Appendix is
not seen; there is no periappendiceal region inflammation. Terminal
ileum appears normal.

There is no bowel obstruction. There is no free air or portal venous
air.

There is no ascites, adenopathy, or abscess in the abdomen or
pelvis. There is atherosclerotic change in the aorta but no
aneurysm. There are no blastic or lytic bone lesions.
IMPRESSION: Underlying cirrhosis with hepatomegaly and splenomegaly, stable. No
focal liver or splenic lesions are identified.

Stable small left adrenal mass. This mass has been stable since
studies dating back to 7933.

Small ventral hernia.  Scarring in the periumbilical region.

No bowel obstruction. No abscess. No renal or ureteral calculus. No
hydronephrosis.

Currently no pancreatic lesions are identified.

## 2015-12-31 NOTE — Progress Notes (Signed)
Overall stable. No new issues or problems. Pain controlled. No wound drainage. Continue IV antibiotics.

## 2015-12-31 NOTE — Progress Notes (Signed)
Pharmacy Antibiotic Note  [name] is a 60 y.o. female admitted on 12/26/2015 with wound infection- exposed skull.  Pharmacy has been consulted for vancomycin and imipenem dosing.  Patient had subdural hematoma s/p evacuation, then cranioplasty with bone flap replacement on 3/10. She had been calling neurosurgery office for ~2 months with complaints of drainage, however did not go in to the office until 6/19. Scalp defect with visible skull and hardware.   Vancomycin trough is therapeutic, will continue current dose and frequency.  Plan: Continue vancomycin 750mg  IV Q8h  Continue Primaxin 500mg  IV Q6h  Dc Macrobid while receiving IV abx? Monitor clinical picture, renal function, VT as needed F/U C&S, renal function, abx deescalation / LOT  Height: 5\' 8"  (172.7 cm) Weight: 253 lb 15.5 oz (115.2 kg) IBW/kg (Calculated) : 63.9  Temp (24hrs), Avg:98.2 F (36.8 C), Min:97.9 F (36.6 C), Max:98.7 F (37.1 C)   Recent Labs Lab 12/26/15 1341 12/29/15 0149 12/30/15 0751 12/30/15 0930 12/31/15 0615 12/31/15 1515  WBC 3.7* 4.3  --  6.3  --   --   CREATININE 0.84  --  0.80  --  0.77  --   VANCOTROUGH  --   --   --   --   --  16    Estimated Creatinine Clearance: 102.1 mL/min (by C-G formula based on Cr of 0.77).    Allergies  Allergen Reactions  . Ibuprofen Other (See Comments)    Pt states that she is unable to take this medication because of her liver.    . Sulfa Antibiotics Hives  . Amoxicillin Itching, Rash and Other (See Comments)    Has patient had a PCN reaction causing immediate rash, facial/tongue/throat swelling, SOB or lightheadedness with hypotension: No Has patient had a PCN reaction causing severe rash involving mucus membranes or skin necrosis: No Has patient had a PCN reaction that required hospitalization No Has patient had a PCN reaction occurring within the last 10 years: Yes If all of the above answers are "NO", then may proceed with Cephalosporin use.   . Chocolate Rash  . Ciprofloxacin Itching and Rash  . Dilaudid [Hydromorphone Hcl] Itching  . Fentanyl Rash  . Hydromorphone Itching  . Metformin Nausea And Vomiting  . Penicillins Itching, Rash and Other (See Comments)    Has patient had a PCN reaction causing immediate rash, facial/tongue/throat swelling, SOB or lightheadedness with hypotension: No Has patient had a PCN reaction causing severe rash involving mucus membranes or skin necrosis: No Has patient had a PCN reaction that required hospitalization No Has patient had a PCN reaction occurring within the last 10 years: Yes If all of the above answers are "NO", then may proceed with Cephalosporin use.  . Strawberry Extract Rash  . Zofran [Ondansetron Hcl] Itching and Nausea And Vomiting    Antimicrobials this admission: Vancomycin 6/20 >>  Nitrofurantoin 6/23>>  Primaxin 6/24>>  Dose adjustments this admission: 6/25 VT: 16  Microbiology results: MRSA PCR positive  Fungal cx 6/22 > sent  6/22: Deep Wound cx > Few GPCs in clusters 2/2, rare GPR in 1/2  Thank you for allowing pharmacy to be a part of this patient's care.    Hughes Better, PharmD, BCPS Clinical Pharmacist Pager: [phone] 12/31/2015 6:46 PM

## 2015-12-31 NOTE — Progress Notes (Addendum)
Patient ID: Corinne Ports, female   DOB: 07/28/1955, 60 y.o.   MRN: [id number]         [facility] for Infect[facility]    Date of Admission:  12/26/2015           Day 6 vancomycin        Day 2 Imipenem  Operative Gram stain's reveal gram-positive cocci in clusters and gram-positive rods in both bone and scalp specimens. Cultures have grown MRSA and Klebsiella susceptible to imipenem. I will continue current antibiotics.         Michel Bickers, MD [facility] for Infect[facility] [phone] pager   [phone] cell 07/11/2015, 1:32 PM

## 2016-01-01 DIAGNOSIS — B9562 Methicillin resistant Staphylococcus aureus infection as the cause of diseases classified elsewhere: Secondary | ICD-10-CM

## 2016-01-01 DIAGNOSIS — T814XXS Infection following a procedure, sequela: Secondary | ICD-10-CM

## 2016-01-01 DIAGNOSIS — B961 Klebsiella pneumoniae [K. pneumoniae] as the cause of diseases classified elsewhere: Secondary | ICD-10-CM

## 2016-01-01 LAB — TYPE AND SCREEN
ABO/RH(D): A POS
Antibody Screen: POSITIVE
DAT, IgG: NEGATIVE
Donor AG Type: NEGATIVE
Donor AG Type: NEGATIVE
Donor AG Type: NEGATIVE
PT AG Type: NEGATIVE
Unit division: 0
Unit division: 0
Unit division: 0

## 2016-01-01 LAB — C-REACTIVE PROTEIN: CRP: 3 mg/dL — ABNORMAL HIGH (ref <1.0)

## 2016-01-01 LAB — SEDIMENTATION RATE: SED RATE: 73 mm/h — AB (ref 0–22)

## 2016-01-01 NOTE — Progress Notes (Signed)
   01/01/16 1100  Clinical Encounter Type  Visited With Patient  Visit Type Initial ([facility] CONSULT)  Referral From Nurse  Spiritual Encounters  Spiritual Needs Prayer  Chaplain received consult for major life transition.  Chaplain visited patient on morning rounds.  Patient was asleep.  Chaplain returned.  Chaplain spoke with patient who then requested prayer.  Chaplain inquired as to specific prayer requests, of which there were none.  Chaplain prayed.

## 2016-01-01 NOTE — Progress Notes (Signed)
Patient ID: [name], female   DOB: 20-Oct-1955, 60 y.o.   MRN: [id number] BP 113/70 mmHg  Pulse 83  Temp(Src) 98.4 F (36.9 C) (Oral)  Resp 20  Ht 5\' 8"  (1.727 m)  Wt 115.2 kg (253 lb 15.5 oz)  BMI 38.62 kg/m2  SpO2 96% Wound is clean, dry, no signs of infection Continue vancomycin and imipenem.  Neurologically intact.

## 2016-01-01 NOTE — Progress Notes (Signed)
[facility] for Infectious Disease    Date of Admission:  12/26/2015   Total days of antibiotics 7        Day 7 vanco        Day 3 imipenem           ID: [name] is a 60 y.o. female with MRSA, klebsiella skull osteo Active Problems:   Wound infection after surgery   Status post craniectomy    Subjective: Afebrile, slight headache  Medications:  . sodium chloride  10 mL/hr Intravenous Once  . albuterol  2.5 mg Nebulization QID  . amitriptyline  50 mg Oral QHS  . antiseptic oral rinse  7 mL Mouth Rinse BID  . Chlorhexidine Gluconate Cloth  6 each Topical Q0600  . docusate sodium  100 mg Oral BID  . gabapentin  300 mg Oral TID  . heparin  5,000 Units Subcutaneous Q8H  . hydrOXYzine  50 mg Oral TID  . imipenem-cilastatin  500 mg Intravenous Q6H  . levETIRAcetam  500 mg Oral BID  . lipase/protease/amylase  24,000 Units Oral TID WC  . metoprolol succinate  25 mg Oral Daily  . mometasone-formoterol  2 puff Inhalation BID  . mupirocin ointment  1 application Nasal BID  . nicotine  14 mg Transdermal Q24H  . nitrofurantoin (macrocrystal-monohydrate)  100 mg Oral BID  . ondansetron  4 mg Oral TID  . oxyCODONE  15 mg Oral Q12H  . pantoprazole  40 mg Oral Daily  . PARoxetine  20 mg Oral Daily  . pravastatin  10 mg Oral q1800  . sertraline  50 mg Oral Daily  . sodium chloride flush  3 mL Intravenous Q12H  . spironolactone  50 mg Oral BID  . tiotropium  18 mcg Inhalation Daily  . vancomycin  750 mg Intravenous Q8H  . zolpidem  10 mg Oral QHS    Objective: Vital signs in last 24 hours: Temp:  [97.9 F (36.6 C)-98.4 F (36.9 C)] 98.4 F (36.9 C) (06/26 1812) Pulse Rate:  [83-103] 83 (06/26 1812) Resp:  [18-20] 20 (06/26 1812) BP: (101-113)/(60-75) 113/70 mmHg (06/26 1812) SpO2:  [94 %-100 %] 96 % (06/26 1812)  Physical Exam  Constitutional:  oriented to person, place, and time. appears well-developed and well-nourished. No distress.  HENT: [place]/AT, PERRLA, no  scleral icterus, bandaged right frontal region Mouth/Throat: Oropharynx is clear and moist. No oropharyngeal exudate.  Cardiovascular: Normal rate, regular rhythm and normal heart sounds. Exam reveals no gallop and no friction rub.  No murmur heard.  Pulmonary/Chest: Effort normal and breath sounds normal. No respiratory distress.  has no wheezes.  Neck = supple, no nuchal rigidity Abdominal: Soft. Bowel sounds are normal.  exhibits no distension. There is no tenderness.  Lymphadenopathy: no cervical adenopathy. No axillary adenopathy Neurological: alert and oriented to person, place, and time.  Skin: Skin is warm and dry. No rash noted. No erythema.  Psychiatric: a normal mood and affect.  behavior is normal.   Lab Results  Recent Labs  12/30/15 0751 12/30/15 0930 12/31/15 0615  WBC  --  6.3  --   HGB  --  7.9*  --   HCT  --  26.6*  --   NA 136  --  140  K 3.8  --  4.4  CL 106  --  111  CO2 23  --  24  BUN 5*  --  6  CREATININE 0.80  --  0.77  No results found for: ESRSEDRATE, POCTSEDRATE . Microbiology: MRSA Klebsiella Possible anaerobe Studies/Results: No results found.   Assessment/Plan: Polymicrobial skull osteomyelitis s/p debridement = will get picc line for 6-8 wk of IV abtx. Plan for vancomycin plus  Imipenem. She reportedly had anaphylaxic reaction with penicillins but tolerating carbapenems. Awaiting on anaerobic cx results.  Will need to monitor kidney function tightly due to nephrotoxicity of vancomycin  Will check sed rate and crp  Diagnosis: Skull osteomyelitis  Culture Result: MRSA, klebseilla  Allergies  Allergen Reactions  . Ibuprofen Other (See Comments)    Pt states that she is unable to take this medication because of her liver.    . Sulfa Antibiotics Hives  . Amoxicillin Itching, Rash and Other (See Comments)    Has patient had a PCN reaction causing immediate rash, facial/tongue/throat swelling, SOB or lightheadedness with hypotension:  No Has patient had a PCN reaction causing severe rash involving mucus membranes or skin necrosis: No Has patient had a PCN reaction that required hospitalization No Has patient had a PCN reaction occurring within the last 10 years: Yes If all of the above answers are "NO", then may proceed with Cephalosporin use.  . Chocolate Rash  . Ciprofloxacin Itching and Rash  . Dilaudid [Hydromorphone Hcl] Itching  . Fentanyl Rash  . Hydromorphone Itching  . Metformin Nausea And Vomiting  . Penicillins Itching, Rash and Other (See Comments)    Has patient had a PCN reaction causing immediate rash, facial/tongue/throat swelling, SOB or lightheadedness with hypotension: No Has patient had a PCN reaction causing severe rash involving mucus membranes or skin necrosis: No Has patient had a PCN reaction that required hospitalization No Has patient had a PCN reaction occurring within the last 10 years: Yes If all of the above answers are "NO", then may proceed with Cephalosporin use.  . Strawberry Extract Rash  . Zofran [Ondansetron Hcl] Itching and Nausea And Vomiting    Discharge antibiotics: Per pharmacy protocol  Vancomycin plus piptazo Aim for Vancomycin trough 15-20 (unless otherwise indicated) Duration: 6 wk End Date: Aug 2nd 2017  [facility] Care Per Protocol:  Labs weekly while on IV antibiotics: _x_ CBC with differential _x_ CMP, twice a week _x_ CRP _x_ ESR _x_ Vancomycin trough  Fax weekly labs to [phone]  Clinic Follow Up Appt: [facility]  @ 6 wk    Baxter Flattery [facility] for Infectious Diseases Cell: [phone] Pager: [phone]  01/01/2016, 8:13 PM

## 2016-01-01 NOTE — Care Management Note (Signed)
Case Management Note  Patient Details  Name: [name] MRN: [id number] Date of Birth: [birth year]  Subjective/Objective:                    Action/Plan: Pt with continued IV antibiotics. CM following for discharge needs.   Expected Discharge Date:                  Expected Discharge Plan:     In-House Referral:     Discharge planning Services     Post Acute Care Choice:    Choice offered to:     DME Arranged:    DME Agency:     HH Arranged:    HH Agency:     Status of Service:     If discussed at [employer], dates discussed:    Additional Comments:  Pollie Friar, RN 01/01/2016, 11:39 AM

## 2016-01-02 LAB — CBC WITH DIFFERENTIAL/PLATELET
Basophils Absolute: 0 10*3/uL (ref 0.0–0.1)
Basophils Relative: 0 %
EOS ABS: 0.2 10*3/uL (ref 0.0–0.7)
Eosinophils Relative: 4 %
HCT: 26.5 % — ABNORMAL LOW (ref 36.0–46.0)
HEMOGLOBIN: 7.8 g/dL — AB (ref 12.0–15.0)
LYMPHS ABS: 1.3 10*3/uL (ref 0.7–4.0)
LYMPHS PCT: 31 %
MCH: 23.6 pg — AB (ref 26.0–34.0)
MCHC: 29.4 g/dL — AB (ref 30.0–36.0)
MCV: 80.1 fL (ref 78.0–100.0)
Monocytes Absolute: 0.5 10*3/uL (ref 0.1–1.0)
Monocytes Relative: 11 %
NEUTROS ABS: 2.1 10*3/uL (ref 1.7–7.7)
NEUTROS PCT: 54 %
Platelets: 118 10*3/uL — ABNORMAL LOW (ref 150–400)
RBC: 3.31 MIL/uL — ABNORMAL LOW (ref 3.87–5.11)
RDW: 21.9 % — ABNORMAL HIGH (ref 11.5–15.5)
WBC: 4.1 10*3/uL (ref 4.0–10.5)

## 2016-01-02 LAB — BASIC METABOLIC PANEL
Anion gap: 5 (ref 5–15)
CHLORIDE: 110 mmol/L (ref 101–111)
CO2: 23 mmol/L (ref 22–32)
CREATININE: 0.75 mg/dL (ref 0.44–1.00)
Calcium: 8.8 mg/dL — ABNORMAL LOW (ref 8.9–10.3)
GFR calc Af Amer: >60 mL/min (ref >60)
GFR calc non Af Amer: >60 mL/min (ref >60)
Glucose, Bld: 78 mg/dL (ref 65–99)
Potassium: 4.3 mmol/L (ref 3.5–5.1)
SODIUM: 138 mmol/L (ref 135–145)

## 2016-01-02 MED ORDER — ALBUTEROL SULFATE (2.5 MG/3ML) 0.083% IN NEBU
3.0000 mL | INHALATION_SOLUTION | Freq: Two times a day (BID) | RESPIRATORY_TRACT | Status: DC
  Administered 2016-01-02 – 2016-01-05 (×5): 3 mL via RESPIRATORY_TRACT
  Filled 2016-01-02 (×5): qty 3

## 2016-01-02 MED ORDER — ALBUTEROL SULFATE (2.5 MG/3ML) 0.083% IN NEBU: 2.5000 mg | INHALATION_SOLUTION | RESPIRATORY_TRACT | Status: DC | PRN

## 2016-01-02 NOTE — Progress Notes (Signed)
Spoke with primary RN and confirmed pt was not for discharge today.  Awaiting PICC insertion.

## 2016-01-02 NOTE — Progress Notes (Signed)
Patient ID: [name], female   DOB: 06/05/1956, 60 y.o.   MRN: [id number] BP 98/69 mmHg  Pulse 81  Temp(Src) 97.5 F (36.4 C) (Axillary)  Resp 16  Ht 5\' 8"  (1.727 m)  Wt 115.2 kg (253 lb 15.5 oz)  BMI 38.62 kg/m2  SpO2 94% Appreciate ID help. Will start preparing for discharge.  Will need 6 weeks IV antibiotics Moving all extremities well Wound is clean, dry, no signs of active infection

## 2016-01-03 ENCOUNTER — Inpatient Hospital Stay (HOSPITAL_COMMUNITY): Payer: Medicare Other

## 2016-01-03 LAB — AEROBIC/ANAEROBIC CULTURE W GRAM STAIN (SURGICAL/DEEP WOUND)

## 2016-01-03 LAB — AEROBIC/ANAEROBIC CULTURE (SURGICAL/DEEP WOUND)

## 2016-01-03 LAB — ANAEROBIC CULTURE

## 2016-01-03 MED ORDER — SODIUM CHLORIDE 0.9% FLUSH: 10.0000 mL | INTRAVENOUS | Status: DC | PRN

## 2016-01-03 NOTE — Progress Notes (Signed)
Peripherally Inserted Central Catheter/Midline Placement  The IV Nurse has discussed with the patient and/or persons authorized to consent for the patient, the purpose of this procedure and the potential benefits and risks involved with this procedure.  The benefits include less needle sticks, lab draws from the catheter and patient may be discharged home with the catheter.  Risks include, but not limited to, infection, bleeding, blood clot (thrombus formation), and puncture of an artery; nerve damage and irregular heat beat.  Alternatives to this procedure were also discussed.  PICC/Midline Placement Documentation        Myracle Febres, Nicolette Bang 01/03/2016, 12:36 PM

## 2016-01-03 NOTE — Progress Notes (Signed)
[facility] for Infectious Disease    Date of Admission:  12/26/2015   Total days of antibiotics 9        Day 9 vanco        Day 5 imipenem           ID: [name] is a 60 y.o. female with MRSA, klebsiella skull osteo Active Problems:   Wound infection after surgery   Status post craniectomy    Subjective: Afebrile, slight headache  Medications:  . sodium chloride  10 mL/hr Intravenous Once  . albuterol  3 mL Inhalation BID  . amitriptyline  50 mg Oral QHS  . antiseptic oral rinse  7 mL Mouth Rinse BID  . docusate sodium  100 mg Oral BID  . gabapentin  300 mg Oral TID  . heparin  5,000 Units Subcutaneous Q8H  . hydrOXYzine  50 mg Oral TID  . imipenem-cilastatin  500 mg Intravenous Q6H  . levETIRAcetam  500 mg Oral BID  . lipase/protease/amylase  24,000 Units Oral TID WC  . metoprolol succinate  25 mg Oral Daily  . mometasone-formoterol  2 puff Inhalation BID  . nicotine  14 mg Transdermal Q24H  . nitrofurantoin (macrocrystal-monohydrate)  100 mg Oral BID  . ondansetron  4 mg Oral TID  . oxyCODONE  15 mg Oral Q12H  . pantoprazole  40 mg Oral Daily  . PARoxetine  20 mg Oral Daily  . pravastatin  10 mg Oral q1800  . sertraline  50 mg Oral Daily  . sodium chloride flush  3 mL Intravenous Q12H  . spironolactone  50 mg Oral BID  . tiotropium  18 mcg Inhalation Daily  . vancomycin  750 mg Intravenous Q8H  . zolpidem  10 mg Oral QHS    Objective: Vital signs in last 24 hours: Temp:  [97.5 F (36.4 C)-98.8 F (37.1 C)] 98.2 F (36.8 C) (06/28 1356) Pulse Rate:  [81-95] 94 (06/28 1356) Resp:  [16-18] 18 (06/28 1356) BP: (98-128)/(65-88) 128/88 mmHg (06/28 1356) SpO2:  [94 %-100 %] 99 % (06/28 1356)  Physical Exam  Constitutional:  oriented to person, place, and time. appears well-developed and well-nourished. No distress.  HENT: [place]/AT, PERRLA, no scleral icterus, bandaged right frontal region Mouth/Throat: Oropharynx is clear and moist. No oropharyngeal  exudate.  Cardiovascular: Normal rate, regular rhythm and normal heart sounds. Exam reveals no gallop and no friction rub.  No murmur heard.  Pulmonary/Chest: Effort normal and breath sounds normal. No respiratory distress.  has no wheezes.  Neck = supple, no nuchal rigidity Abdominal: Soft. Bowel sounds are normal.  exhibits no distension. There is no tenderness.  Lymphadenopathy: no cervical adenopathy. No axillary adenopathy Neurological: alert and oriented to person, place, and time.  Skin: Skin is warm and dry. No rash noted. No erythema.  Psychiatric: a normal mood and affect.  behavior is normal.   Lab Results  Recent Labs  01/02/16 0351  WBC 4.1  HGB 7.8*  HCT 26.5*  NA 138  K 4.3  CL 110  CO2 23  BUN <5*  CREATININE 0.75   Lab Results  Component Value Date   ESRSEDRATE 73* 01/01/2016   . Microbiology: MRSA Klebsiella  Studies/Results: Dg Chest Port 1 View  01/03/2016  CLINICAL DATA:  Right arm PICC line insertion EXAM: PORTABLE CHEST 1 VIEW COMPARISON:  10/24/2015 FINDINGS: Cardiomediastinal silhouette is stable. There is a right arm PICC line with tip in SVC right atrium junction. No pneumothorax. No  infiltrate or pulmonary edema. IMPRESSION: Right arm PICC line with tip in SVC right atrium junction. No pneumothorax. Electronically Signed   By: Lahoma Crocker M.D.   On: 01/03/2016 13:34     Assessment/Plan: Polymicrobial skull osteomyelitis s/p debridement = will get picc line for 6 wk of IV abtx. Plan for vancomycin plus  Imipenem. She reportedly had anaphylaxic reaction with penicillins but tolerating carbapenems. Thus far her culture results have isolated MRSA and klebseilla. Though the klebsiella is a sensitive pathogen, will not narrow due to her anaphylaxis hx of with penicillin  Will need to monitor kidney function tightly due to nephrotoxicity of vancomycin  Recommend that she gets Iv abtx through SNF since i worry about complexity of care being delivered  at home in addition to patient may benefit from participating with PT.OT  --------will sign off---------------   Diagnosis: Skull osteomyelitis  Culture Result: MRSA, klebseilla  Allergies  Allergen Reactions  . Ibuprofen Other (See Comments)    Pt states that she is unable to take this medication because of her liver.    . Sulfa Antibiotics Hives  . Amoxicillin Itching, Rash and Other (See Comments)    Has patient had a PCN reaction causing immediate rash, facial/tongue/throat swelling, SOB or lightheadedness with hypotension: No Has patient had a PCN reaction causing severe rash involving mucus membranes or skin necrosis: No Has patient had a PCN reaction that required hospitalization No Has patient had a PCN reaction occurring within the last 10 years: Yes If all of the above answers are "NO", then may proceed with Cephalosporin use.  . Chocolate Rash  . Ciprofloxacin Itching and Rash  . Dilaudid [Hydromorphone Hcl] Itching  . Fentanyl Rash  . Hydromorphone Itching  . Metformin Nausea And Vomiting  . Penicillins Itching, Rash and Other (See Comments)    Has patient had a PCN reaction causing immediate rash, facial/tongue/throat swelling, SOB or lightheadedness with hypotension: No Has patient had a PCN reaction causing severe rash involving mucus membranes or skin necrosis: No Has patient had a PCN reaction that required hospitalization No Has patient had a PCN reaction occurring within the last 10 years: Yes If all of the above answers are "NO", then may proceed with Cephalosporin use.  . Strawberry Extract Rash  . Zofran [Ondansetron Hcl] Itching and Nausea And Vomiting    Discharge antibiotics: Per pharmacy protocol  Vancomycin plus piptazo Aim for Vancomycin trough 15-20 (unless otherwise indicated) Duration: 6 wk End Date: Aug 2nd 2017  [facility] Care Per Protocol:  Labs weekly while on IV antibiotics: _x_ CBC with differential _x_ CMP, twice a week _x_ CRP _x_  ESR _x_ Vancomycin trough  Fax weekly labs to [phone]  Clinic Follow Up Appt: [facility]  @ 6 wk    Baxter Flattery [facility] for Infectious Diseases Cell: [phone] Pager: [phone]  01/03/2016, 2:27 PM

## 2016-01-03 NOTE — Progress Notes (Signed)
Pharmacy Antibiotic Note  [name] is a 60 y.o. female admitted on 12/26/2015 with wound infection- exposed skull.  Pharmacy has been consulted for vancomycin and imipenem dosing.  Patient had subdural hematoma s/p evacuation, then cranioplasty with bone flap replacement on 3/10. She had been calling neurosurgery office for ~2 months with complaints of drainage, however did not go in to the office until 6/19. Scalp defect with visible skull and hardware. Anaphylaxis to PCNs. Day #9 of abx for wound infection, MRSA, klebsiella skull osteo. Afebrile, WBC 4.1. Plan for 6 weeks IV abx. Got PICC line 6/28 Vanc trough on 6/25 was therapeutic at 16. ** Recommend plan for weekly vancomycin trough to monitor for accumulation in pt with BMI ~39. Next would be due July 2nd. Consider sooner if renal function declines   Plan: Continue vancomycin 750mg  IV Q8h  Continue Primaxin 500mg  IV Q6h  Dc Macrobid while receiving IV abx? Monitor renal function, Vanc trough weekly   Height: 5\' 8"  (172.7 cm) Weight: 253 lb 15.5 oz (115.2 kg) IBW/kg (Calculated) : 63.9  Temp (24hrs), Avg:98.3 F (36.8 C), Min:97.5 F (36.4 C), Max:98.8 F (37.1 C)   Recent Labs Lab 12/29/15 0149 12/30/15 0751 12/30/15 0930 12/31/15 0615 12/31/15 1515 01/02/16 0351  WBC 4.3  --  6.3  --   --  4.1  CREATININE  --  0.80  --  0.77  --  0.75  VANCOTROUGH  --   --   --   --  16  --     Estimated Creatinine Clearance: 102.1 mL/min (by C-G formula based on Cr of 0.75).    Allergies  Allergen Reactions  . Ibuprofen Other (See Comments)    Pt states that she is unable to take this medication because of her liver.    . Sulfa Antibiotics Hives  . Amoxicillin Itching, Rash and Other (See Comments)    Has patient had a PCN reaction causing immediate rash, facial/tongue/throat swelling, SOB or lightheadedness with hypotension: No Has patient had a PCN reaction causing severe rash involving mucus membranes or skin necrosis:  No Has patient had a PCN reaction that required hospitalization No Has patient had a PCN reaction occurring within the last 10 years: Yes If all of the above answers are "NO", then may proceed with Cephalosporin use.  . Chocolate Rash  . Ciprofloxacin Itching and Rash  . Dilaudid [Hydromorphone Hcl] Itching  . Fentanyl Rash  . Hydromorphone Itching  . Metformin Nausea And Vomiting  . Penicillins Itching, Rash and Other (See Comments)    Has patient had a PCN reaction causing immediate rash, facial/tongue/throat swelling, SOB or lightheadedness with hypotension: No Has patient had a PCN reaction causing severe rash involving mucus membranes or skin necrosis: No Has patient had a PCN reaction that required hospitalization No Has patient had a PCN reaction occurring within the last 10 years: Yes If all of the above answers are "NO", then may proceed with Cephalosporin use.  . Strawberry Extract Rash  . Zofran [Ondansetron Hcl] Itching and Nausea And Vomiting    Antimicrobials this admission: Vancomycin 6/20 >>  Nitrofurantoin 6/23>>  Primaxin 6/24>>  Dose adjustments this admission: 6/25 VT: 16  Microbiology results: MRSA PCR positive  Fungal cx 6/22 > pending  6/22: Deep Wound scalp > MRSA and klebsiella 6/22 bone tissue>> abundant staph aureus, few kleb   Thank you for allowing [place] to participate in this patients care. Jens Som, PharmD Pager: [phone]  01/03/2016 1:29 PM

## 2016-01-03 NOTE — Progress Notes (Signed)
Patient ID: [name], female   DOB: 11-25-1955, 60 y.o.   MRN: [id number] BP 123/77 mmHg  Pulse 90  Temp(Src) 98.1 F (36.7 C) (Oral)  Resp 18  Ht 5\' 8"  (1.727 m)  Wt 115.2 kg (253 lb 15.5 oz)  BMI 38.62 kg/m2  SpO2 95% Will pursue snf. Ms. Houlahan while helpful at times does not have proper support to deal with this at home. Will speak with social worker Alert and oriented x 4, speech is clear.

## 2016-01-03 NOTE — Care Management Note (Signed)
Case Management Note  Patient Details  Name: [name] MRN: [id number] Date of Birth: [birth year]  Subjective/Objective:                    Action/Plan: CM attempted to discuss home health with the patient but she refused to stay awake to talk and asked to be left alone. CM asked if her spouse could be called and she gave permission. CM attempted to call pts spouse but was not able to reach him or leave a message. CM will continue to follow.   Expected Discharge Date:                  Expected Discharge Plan:     In-House Referral:     Discharge planning Services     Post Acute Care Choice:    Choice offered to:     DME Arranged:    DME Agency:     HH Arranged:    HH Agency:     Status of Service:     If discussed at [employer], dates discussed:    Additional Comments:  Pollie Friar, RN 01/03/2016, 3:57 PM

## 2016-01-04 NOTE — Progress Notes (Signed)
Patient ID: [name], female   DOB: [birth year], 60 y.o.   MRN: [id number] BP 110/69 mmHg  Pulse 89  Temp(Src) 97.7 F (36.5 C) (Oral)  Resp 20  Ht 5\' 8"  (1.727 m)  Wt 115.2 kg (253 lb 15.5 oz)  BMI 38.62 kg/m2  SpO2 100% Alert and oriented x 4, speech is clear and fluent Moving all extremities well Wound is clean, dry Arranging discharge plans. She mentioned a home service that can possibly provide the antibiotics, because home health can only be there three days a week.

## 2016-01-04 NOTE — NC FL2 (Signed)
[place] LEVEL OF CARE SCREENING TOOL     IDENTIFICATION  Patient Name: [name] Birthdate: 02-01-1956 Sex: female Admission Date (Current Location): 12/26/2015  [facility] and [place] Number:  Herbalist and Address:  [facility], [place] [address] [place], [place] [address]      Provider Number: [id number]  Attending Physician Name and Address:  Ashok Pall, MD  Relative Name and Phone Number:  Oris Drone, mother, [phone]    Current Level of Care: Hospital Recommended Level of Care: [place] Prior Approval Number:    Date Approved/Denied:   PASRR Number: [id number]  Discharge Plan: SNF    Current Diagnoses: Patient Active Problem List   Diagnosis Date Noted  . Status post craniectomy 12/28/2015  . Wound infection after surgery 12/26/2015  . UTI (lower urinary tract infection) 10/22/2015  . Chest pain on breathing   . SOB (shortness of breath)   . Wheezing   . Subdural hematoma ([place]) 08/16/2015  . Sepsis ([place]) 06/22/2015  . Orthostatic hypotension 05/15/2015  . ALC (alcoholic liver cirrhosis) ([place]) 12/23/2014  . Clinical depression 12/23/2014  . Anxiety 12/23/2014  . Diabetes mellitus, type 2 ([place]) 12/23/2014  . HTN (hypertension) 11/05/2014  . Liver cirrhosis ([place]) 07/19/2014  . 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 Chronic pancreatitis ([place]) 11/03/2012  . Seizure disorder ([place]) 11/03/2012  . Benign meningioma of brain ([place]) 11/03/2012  . Chronic pelvic pain in female 11/03/2012  . Hepatitis C virus infection without hepatic coma 11/03/2012  . UTI (urinary tract infection) 11/03/2012  . Alcohol abuse, unspecified 11/03/2012  . Chronic liver disease 11/03/2012  . Schizophrenia ([place]) 11/03/2012  . Narcotic abuse 11/03/2012  . 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 Breast screening 11/03/2012  . Convulsions, epileptic ([place]) 11/03/2012  . Nondependent alcohol abuse 11/03/2012   . 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 Female genital symptoms 11/03/2012    Orientation RESPIRATION BLADDER Height & Weight     Self, Time, Situation, Place  Normal Continent Weight: 115.2 kg (253 lb 15.5 oz) Height:  5\' 8"  (172.7 cm)  BEHAVIORAL SYMPTOMS/MOOD NEUROLOGICAL BOWEL NUTRITION STATUS      Continent Diet (Please see DC Summary)  AMBULATORY STATUS COMMUNICATION OF NEEDS Skin   Independent Verbally Surgical wounds (Closed incision on head)                       Personal Care Assistance Level of Assistance  Bathing, Feeding, Dressing Bathing Assistance: Limited assistance Feeding assistance: Independent Dressing Assistance: Limited assistance     Functional Limitations Info             SPECIAL CARE FACTORS FREQUENCY   (IV antibiotics)                    Contractures      Additional Factors Info  Code Status, Allergies, Isolation Precautions, Psychotropic Code Status Info: Full Allergies Info: Ibuprofen, Sulfa Antibiotics, Amoxicillin, Chocolate, Ciprofloxacin, Dilaudid, Fentanyl, Hydromorphone, Metformin, Penicillins, Strawberry Extract, Zofran Psychotropic Info: Paxil; Zoloft; Ambien   Isolation Precautions Info: MRSA; Extended spectrum beta lactamase     Current Medications (01/04/2016):  This is the current hospital active medication list Current Facility-Administered Medications  Medication Dose Route Frequency Provider Last Rate Last Dose  . 0.9 %  sodium chloride infusion  250 mL Intravenous PRN Ashok Pall, MD      . 0.9 %  sodium  chloride infusion  10 mL/hr Intravenous Once Roberts Gaudy, MD      . 0.9 % NaCl with KCl 20 mEq/ L  infusion   Intravenous Continuous Ashok Pall, MD 80 mL/hr at 01/02/16 2048    . acetaminophen (TYLENOL) tablet 650 mg  650 mg Oral Q6H PRN Ashok Pall, MD   650 mg at 01/03/16 1100   Or  . acetaminophen (TYLENOL) suppository 650 mg  650 mg Rectal Q6H PRN Ashok Pall, MD      . albuterol (PROVENTIL) (2.5 MG/3ML) 0.083% nebulizer solution 2.5 mg  2.5 mg Nebulization Q4H PRN Ashok Pall, MD      . albuterol (PROVENTIL) (2.5 MG/3ML) 0.083% nebulizer solution 3 mL  3 mL Inhalation BID Ashok Pall, MD   3 mL at 01/04/16 0749  . ALPRAZolam Duanne Moron) tablet 1 mg  1 mg Oral TID PRN Ashok Pall, MD   1 mg at 01/03/16 1058  . amitriptyline (ELAVIL) tablet 50 mg  50 mg Oral QHS Ashok Pall, MD   50 mg at 01/03/16 2135  . antiseptic oral rinse (CPC / CETYLPYRIDINIUM CHLORIDE 0.05%) solution 7 mL  7 mL Mouth Rinse BID Ashok Pall, MD   7 mL at 01/03/16 2136  . diphenhydrAMINE (BENADRYL) capsule 25 mg  25 mg Oral Q8H PRN Ashok Pall, MD   25 mg at 12/28/15 0355  . docusate sodium (COLACE) capsule 100 mg  100 mg Oral BID Ashok Pall, MD   100 mg at 01/03/16 2134  . fentaNYL (SUBLIMAZE) injection 25-50 mcg  25-50 mcg Intravenous Q5 min PRN Roberts Gaudy, MD   50 mcg at 12/29/15 1046  . gabapentin (NEURONTIN) capsule 300 mg  300 mg Oral TID Ashok Pall, MD   300 mg at 01/03/16 2135  . heparin injection 5,000 Units  5,000 Units Subcutaneous Q8H Ashok Pall, MD   5,000 Units at 01/04/16 [id number]  . hydrOXYzine (ATARAX/VISTARIL) tablet 50 mg  50 mg Oral TID Ashok Pall, MD   50 mg at 01/03/16 2135  . imipenem-cilastatin (PRIMAXIN) 500 mg in sodium chloride 0.9 % 100 mL IVPB  500 mg Intravenous Q6H Meagan C Miles, RPH   500 mg at 01/04/16 [id number]  . ipratropium-albuterol (DUONEB) 0.5-2.5 (3) MG/3ML nebulizer solution 3 mL  3 mL Nebulization Q6H PRN Ashok Pall, MD      . levETIRAcetam (KEPPRA) tablet 500 mg  500 mg Oral BID Ashok Pall, MD   500 mg at 01/03/16 2135  . lipase/protease/amylase (CREON) capsule 24,000 Units  24,000 Units Oral TID WC Ashok Pall, MD   24,000 Units at 01/03/16 1949  . metoCLOPramide (REGLAN) injection 10 mg  10 mg Intravenous Once PRN Roberts Gaudy, MD      . metoprolol succinate (TOPROL-XL) 24 hr tablet 25 mg  25 mg Oral Daily Ashok Pall, MD    25 mg at 01/03/16 1059  . mometasone-formoterol (DULERA) 200-5 MCG/ACT inhaler 2 puff  2 puff Inhalation BID Ashok Pall, MD   2 puff at 01/04/16 0748  . morphine 2 MG/ML injection 2-6 mg  2-6 mg Intravenous Q1H PRN Ashok Pall, MD   2 mg at 12/30/15 0912  . nicotine (NICODERM CQ - dosed in mg/24 hours) patch 14 mg  14 mg Transdermal Q24H Ashok Pall, MD   14 mg at 01/03/16 2022  . nitrofurantoin (macrocrystal-monohydrate) (MACROBID) capsule 100 mg  100 mg Oral BID Ashok Pall, MD   100 mg at 01/03/16 2136  . ondansetron (ZOFRAN) tablet 4 mg  4 mg  Oral Q6H PRN Ashok Pall, MD   4 mg at 12/27/15 1454   Or  . ondansetron (ZOFRAN) injection 4 mg  4 mg Intravenous Q6H PRN Ashok Pall, MD      . ondansetron [facility]) tablet 4 mg  4 mg Oral TID Ashok Pall, MD   4 mg at 01/04/16 0044  . oxyCODONE (Oxy IR/ROXICODONE) immediate release tablet 5 mg  5 mg Oral Q4H PRN Ashok Pall, MD   5 mg at 01/03/16 2040  . oxyCODONE (OXYCONTIN) 12 hr tablet 15 mg  15 mg Oral Q12H Ashok Pall, MD   15 mg at 01/03/16 2134  . pantoprazole (PROTONIX) EC tablet 40 mg  40 mg Oral Daily Ashok Pall, MD   40 mg at 01/03/16 1100  . PARoxetine (PAXIL) tablet 20 mg  20 mg Oral Daily Ashok Pall, MD   20 mg at 01/03/16 1058  . pravastatin (PRAVACHOL) tablet 10 mg  10 mg Oral q1800 Ashok Pall, MD   10 mg at 01/03/16 1950  . sertraline (ZOLOFT) tablet 50 mg  50 mg Oral Daily Ashok Pall, MD   50 mg at 01/03/16 1059  . sodium chloride flush (NS) 0.9 % injection 10-40 mL  10-40 mL Intracatheter PRN Ashok Pall, MD      . sodium chloride flush (NS) 0.9 % injection 3 mL  3 mL Intravenous Q12H Ashok Pall, MD   3 mL at 01/03/16 1252  . sodium chloride flush (NS) 0.9 % injection 3 mL  3 mL Intravenous PRN Ashok Pall, MD      . spironolactone (ALDACTONE) tablet 50 mg  50 mg Oral BID Ashok Pall, MD   50 mg at 01/03/16 1950  . tiotropium (SPIRIVA) inhalation capsule 18 mcg  18 mcg Inhalation Daily Ashok Pall, MD   18 mcg  at 01/04/16 0746  . triamcinolone (NASACORT) nasal inhaler 2 spray  2 spray Nasal Daily PRN Ashok Pall, MD      . vancomycin (VANCOCIN) IVPB 750 mg/150 ml premix  750 mg Intravenous Q8H Meagan Ival Bible, RPH   750 mg at 01/04/16 0148  . zolpidem (AMBIEN) tablet 10 mg  10 mg Oral QHS Ashok Pall, MD   10 mg at 01/03/16 2134     Discharge Medications: Please see discharge summary for a list of discharge medications.  Relevant Imaging Results:  Relevant Lab Results:   Additional Information SSN: [place], [place]

## 2016-01-04 NOTE — Care Management Note (Addendum)
Case Management Note  Patient Details  Name: [name] MRN: [id number] Date of Birth: 13-Feb-1956  Subjective/Objective:                    Action/Plan: CM has spoken with the patient, her husband and her mother about discharge planning. Patients mother feels the patient does not need to go home with IV antibiotics and would do better in a SNF. Patients husband and mother are not able to assist her with IV antibiotic administration at home. The patients mother states the patient wants to return home so she can smoke. CM spoke to [name] several times today and initially she stated she would think about going to SNF. At this time she is refusing and wants to go home. CM asked Pam with IV therapy to assess her learning with giving herself IV antibiotics. Pam stated she did not do well and often contaminated the PICC port. CM readressed with [name] the importance of keeping infection out of the PICC and that she did not need any further infections. Then Mrs Hoffmeyer states her sons girlfriend [name]) would be able to assist her at home. CM asked that Tanesha call CM and discuss the requirements that will be needed for home IV therapy. Tanesha called and CM discussed with her that the antibiotics will be several times a day and that it is very important that the process is done correctly so the patient does not get any further infections. [name] voiced she could be available. CM asked [name] to please be available at the hospital tomorrow to go through the IV education. [name] is to call CM in the am and try to come to the hospital for training. CM will continue to follow.  Expected Discharge Date:                  Expected Discharge Plan:     In-House Referral:     Discharge planning Services     Post Acute Care Choice:    Choice offered to:     DME Arranged:    DME Agency:     HH Arranged:    HH Agency:     Status of Service:     If discussed at [employer], dates  discussed:    Additional Comments:  Pollie Friar, RN 01/04/2016, 3:21 PM

## 2016-01-05 ENCOUNTER — Telehealth: Payer: Self-pay | Admitting: Obstetrics and Gynecology

## 2016-01-05 DIAGNOSIS — G9340 Encephalopathy, unspecified: Secondary | ICD-10-CM | POA: Insufficient documentation

## 2016-01-05 DIAGNOSIS — M869 Osteomyelitis, unspecified: Secondary | ICD-10-CM | POA: Insufficient documentation

## 2016-01-05 MED ORDER — OXYCODONE-ACETAMINOPHEN 10-325 MG PO TABS: 1.0000 | ORAL_TABLET | Freq: Four times a day (QID) | ORAL | Status: DC | PRN

## 2016-01-05 MED ORDER — ALPRAZOLAM 1 MG PO TABS: 1.0000 mg | ORAL_TABLET | Freq: Two times a day (BID) | ORAL | Status: DC | PRN

## 2016-01-05 MED ORDER — ZOLPIDEM TARTRATE 10 MG PO TABS: 10.0000 mg | ORAL_TABLET | Freq: Every evening | ORAL | Status: DC | PRN

## 2016-01-05 MED ORDER — SODIUM CHLORIDE 0.9 % IV SOLN: 1250.0000 mg | Freq: Two times a day (BID) | INTRAVENOUS | Status: AC

## 2016-01-05 MED ORDER — SODIUM CHLORIDE 0.9 % IV SOLN: 1.0000 g | INTRAVENOUS | Status: AC

## 2016-01-05 MED ORDER — HEPARIN SOD (PORK) LOCK FLUSH 100 UNIT/ML IV SOLN
250.0000 [IU] | INTRAVENOUS | Status: AC | PRN
  Administered 2016-01-05: 250 [IU]

## 2016-01-05 NOTE — Progress Notes (Signed)
ID note:  Polymicrobial skull osteo. Patient is requesting to go home with IV abtx rather than SNF thus abtx regimen will need to change for ease of administration  - recommend to continue with IV vanco, with same end date - discontinue imipenem for discharge - start ertapenem 1gm IV daily to treat for 6 wk ,same end date as vancomycin - same labs as previously ordered - will follow up in the ID clinic in [place]. [place] for Infectious Diseases [phone]

## 2016-01-05 NOTE — Discharge Summary (Signed)
Physician Discharge Summary  Patient ID: [name] MRN: [id number] DOB/AGE: [birth year] 59 y.o.  Admit date: 12/26/2015 Discharge date: 01/05/2016  Admission Diagnoses:wound infection, skull osteomyelitis  Discharge Diagnoses:  Active Problems:   Wound infection after surgery   Status post craniectomy   Osteomyelitis of skull (HCC)   Encephalopathy   Discharged Condition: good  Hospital Course: [name] was admitted to the hospital with an open scalp defect with visual skull, craniotomy flap, and hardware. She was taken to the operating room and had the craniotomy flap removed and thrown away. I closed the scalp primarily. The cultures from the operating room grew Klebsiella. She is being treated with vancomycin, and ertapenem for 6 week total. She is neurologically normal.   Consults: infectious disease  Significant Diagnostic Studies: none  Treatments: surgery: as above.   Discharge Exam: Blood pressure 110/68, pulse 90, temperature 98.1 F (36.7 C), temperature source Oral, resp. rate 19, height 5\' 8"  (1.727 m), weight 115.2 kg (253 lb 15.5 oz), SpO2 100 %. General appearance: alert, cooperative, appears older than stated age, mild distress and moderately obese  Disposition: 01-Home or Self Care     Medication List    STOP taking these medications        cephALEXin 500 MG capsule  Commonly known as:  KEFLEX      TAKE these medications        albuterol 108 (90 Base) MCG/ACT inhaler  Commonly known as:  PROVENTIL HFA;VENTOLIN HFA  Inhale 2 puffs into the lungs every 6 (six) hours as needed for wheezing or shortness of breath.     albuterol (5 MG/ML) 0.5% nebulizer solution  Commonly known as:  PROVENTIL  Take 2.5 mg by nebulization 4 (four) times daily.     ALPRAZolam 1 MG tablet  Commonly known as:  XANAX  Take 1 tablet (1 mg total) by mouth 2 (two) times daily as needed for anxiety.     amitriptyline 50 MG tablet  Commonly known as:  ELAVIL  Take 1  tablet (50 mg total) by mouth at bedtime.     CREON 24000 units Cpep  Generic drug:  Pancrelipase (Lip-Prot-Amyl)  Take 24,000 Units by mouth 3 (three) times daily with meals.     diphenhydrAMINE 25 mg capsule  Commonly known as:  BENADRYL  Take 1 capsule (25 mg total) by mouth every 8 (eight) hours as needed for itching.     ertapenem 1 g in sodium chloride 0.9 % 50 mL  Inject 1 g into the vein daily.     Fluticasone-Salmeterol 250-50 MCG/DOSE Aepb  Commonly known as:  ADVAIR  Inhale 2 puffs into the lungs 2 (two) times daily.     gabapentin 300 MG capsule  Commonly known as:  NEURONTIN  Take 600 mg by mouth 3 (three) times daily.     hydrOXYzine 50 MG capsule  Commonly known as:  VISTARIL  Take 50 mg by mouth 3 (three) times daily.     Ipratropium-Albuterol 20-100 MCG/ACT Aers respimat  Commonly known as:  COMBIVENT  Inhale 2 puffs into the lungs every 6 (six) hours as needed for wheezing or shortness of breath.     levETIRAcetam 500 MG tablet  Commonly known as:  KEPPRA  Take 1 tablet (500 mg total) by mouth 2 (two) times daily.     metoprolol succinate 25 MG 24 hr tablet  Commonly known as:  TOPROL-XL  Take 1 tablet (25 mg total) by mouth daily.  NASACORT ALLERGY 24HR 55 MCG/ACT Aero nasal inhaler  Generic drug:  triamcinolone  Place 2 sprays into the nose daily as needed (for rhinitis).     nitrofurantoin (macrocrystal-monohydrate) 100 MG capsule  Commonly known as:  MACROBID  Take 100 mg by mouth 2 (two) times daily.     omeprazole 20 MG capsule  Commonly known as:  PRILOSEC  Take 1 capsule (20 mg total) by mouth daily.     Oxycodone HCl 20 MG Tabs  Take 1 tablet (20 mg total) by mouth every 12 (twelve) hours as needed.     oxyCODONE-acetaminophen 10-325 MG tablet  Commonly known as:  PERCOCET  Take 1-2 tablets by mouth every 6 (six) hours as needed for pain.     PARoxetine 20 MG tablet  Commonly known as:  PAXIL  Take 20 mg by mouth daily.      phenytoin 125 MG/5ML suspension  Commonly known as:  DILANTIN  Take 225 mg by mouth at bedtime.     pravastatin 10 MG tablet  Commonly known as:  PRAVACHOL  Take 10 mg by mouth daily.     promethazine 25 MG tablet  Commonly known as:  PHENERGAN  Take 25 mg by mouth every 8 (eight) hours as needed for nausea or vomiting.     sertraline 50 MG tablet  Commonly known as:  ZOLOFT  Take 50 mg by mouth daily.     spironolactone 50 MG tablet  Commonly known as:  ALDACTONE  Take 1 tablet (50 mg total) by mouth 2 (two) times daily.     tiotropium 18 MCG inhalation capsule  Commonly known as:  SPIRIVA  Place 18 mcg into inhaler and inhale daily.     vancomycin 1,250 mg in sodium chloride 0.9 % 250 mL  Inject 1,250 mg into the vein every 12 (twelve) hours.     zolpidem 10 MG tablet  Commonly known as:  AMBIEN  Take 1 tablet (10 mg total) by mouth at bedtime.     zolpidem 10 MG tablet  Commonly known as:  AMBIEN  Take 1 tablet (10 mg total) by mouth at bedtime as needed for sleep.           Follow-up Information    Follow up with Coren Sagan L, MD. Call in 1 week.   Specialty:  Neurosurgery   Why:  For suture removal   Contact information:   [address] [place] [address] [phone]       Signed: Winfield Cunas 01/05/2016, 3:01 PM

## 2016-01-05 NOTE — Telephone Encounter (Signed)
Please reiterate to the patient that this is something out of the scope of the GYN practice and really needs to be managed by her PCP or internist.  She has a doctor in [place], I do believe, and several in [place].  It may be better for her to be monitored at the nursing home where she can have constant supervision and care to ensure that she doesn't die.    Rubie Maid, MD [facility]

## 2016-01-05 NOTE — Progress Notes (Signed)
Discharge orders received, Pt for discharge home today with home health per [place]. PICC flushed and capped by IV team Family at bedside to assist patient with discharge. Staff bought pt downstairs via wheelchair.

## 2016-01-05 NOTE — Care Management Note (Signed)
Case Management Note  Patient Details  Name: [name] MRN: [id number] Date of Birth: February 02, 1956  Subjective/Objective:                    Action/Plan: Pt upset today and wanting to go home. CM again attempted to encourage pt to go to SNF for IV therapy. Pt refused. [name] (person going to assist with pt antibiotics at home) called Pam with [facility] IV therapy and made an appointment for 1:30 to get instructions for helping [name] with her IV therapy. [name] arrived to the hospital around 2:45 and did very well with the PICC and IV antibiotic education. Dr Christella Noa on the unit and informed that patient wanting to go home. Orders for discharge placed and paper scripts done for her IV antibiotics. SW added to [facility] orders and [facility] informed. Bedside RN updated.  Expected Discharge Date:                  Expected Discharge Plan:  Cut Off  In-House Referral:     Discharge planning Services  CM Consult  Post Acute Care Choice:  Home Health Choice offered to:  Patient  DME Arranged:    DME Agency:     HH Arranged:  RN, Social Work, IV Antibiotics HH Agency:  [facility]  Status of Service:  Completed, signed off  If discussed at [employer], dates discussed:    Additional Comments:  Pollie Friar, RN 01/05/2016, 4:40 PM

## 2016-01-05 NOTE — Telephone Encounter (Signed)
KJ left a msg on VM and said its very important that you or Dr Marcelline Mates call her back.

## 2016-01-05 NOTE — Discharge Instructions (Signed)
Call the office if your wound starts to drain, if your temperature is greater than 101.5, if there is tenderness, or redness around the wound.  Do not scratch the wound, pick at the wound, pull at the wound, put creams, salves, or other ointments on the wound. You may shower and wash your hair, the wound may get wet.

## 2016-01-05 NOTE — Telephone Encounter (Signed)
Pt calls and states that she has been hospitalized for the last month and is possibly going to be released today if she can prove that she has a provider willing to check on her IV and ensure that it is clean, and in proper place. Pt questions if this is something our office would be willing to do for her. Advised pt that we do no do IV treatment in our office and would not be able to assist her with this. Pt insists that I speak with provider about this as she does not want to be released to the nursing home, pt states she will die if she is unable to go home. Will route message to provider upon patients insistence.

## 2016-01-10 ENCOUNTER — Encounter: Payer: Self-pay | Admitting: Obstetrics and Gynecology

## 2016-01-10 ENCOUNTER — Telehealth: Payer: Self-pay | Admitting: Obstetrics and Gynecology

## 2016-01-10 MED ORDER — NYSTATIN 100000 UNIT/GM EX OINT: 1.0000 "application " | TOPICAL_OINTMENT | Freq: Two times a day (BID) | CUTANEOUS | Status: DC

## 2016-01-10 MED ORDER — TERCONAZOLE 0.4 % VA CREA: 1.0000 | TOPICAL_CREAM | Freq: Every day | VAGINAL | Status: DC

## 2016-01-10 NOTE — Telephone Encounter (Signed)
Patient called stating she has a yeast infection and wants to see Dr Marcelline Mates ASAP but there is no availability on the schedule. Please advise.

## 2016-01-10 NOTE — Telephone Encounter (Signed)
Pt aware that nystatin oint/pwdr if available for yeast under [facility] and Terazol RX for yeast infection sent to pharmacy.

## 2016-01-11 IMAGING — CR DG CHEST 1V PORT
1 series · 1 of 1 positions shown · non-contrast
Comparison: Chest x-rays dated 09/13/2013 and 06/10/2013 and chest
CT dated 06/07/2013

CLINICAL DATA: Cough.

EXAM:
PORTABLE CHEST - 1 VIEW

[ap]
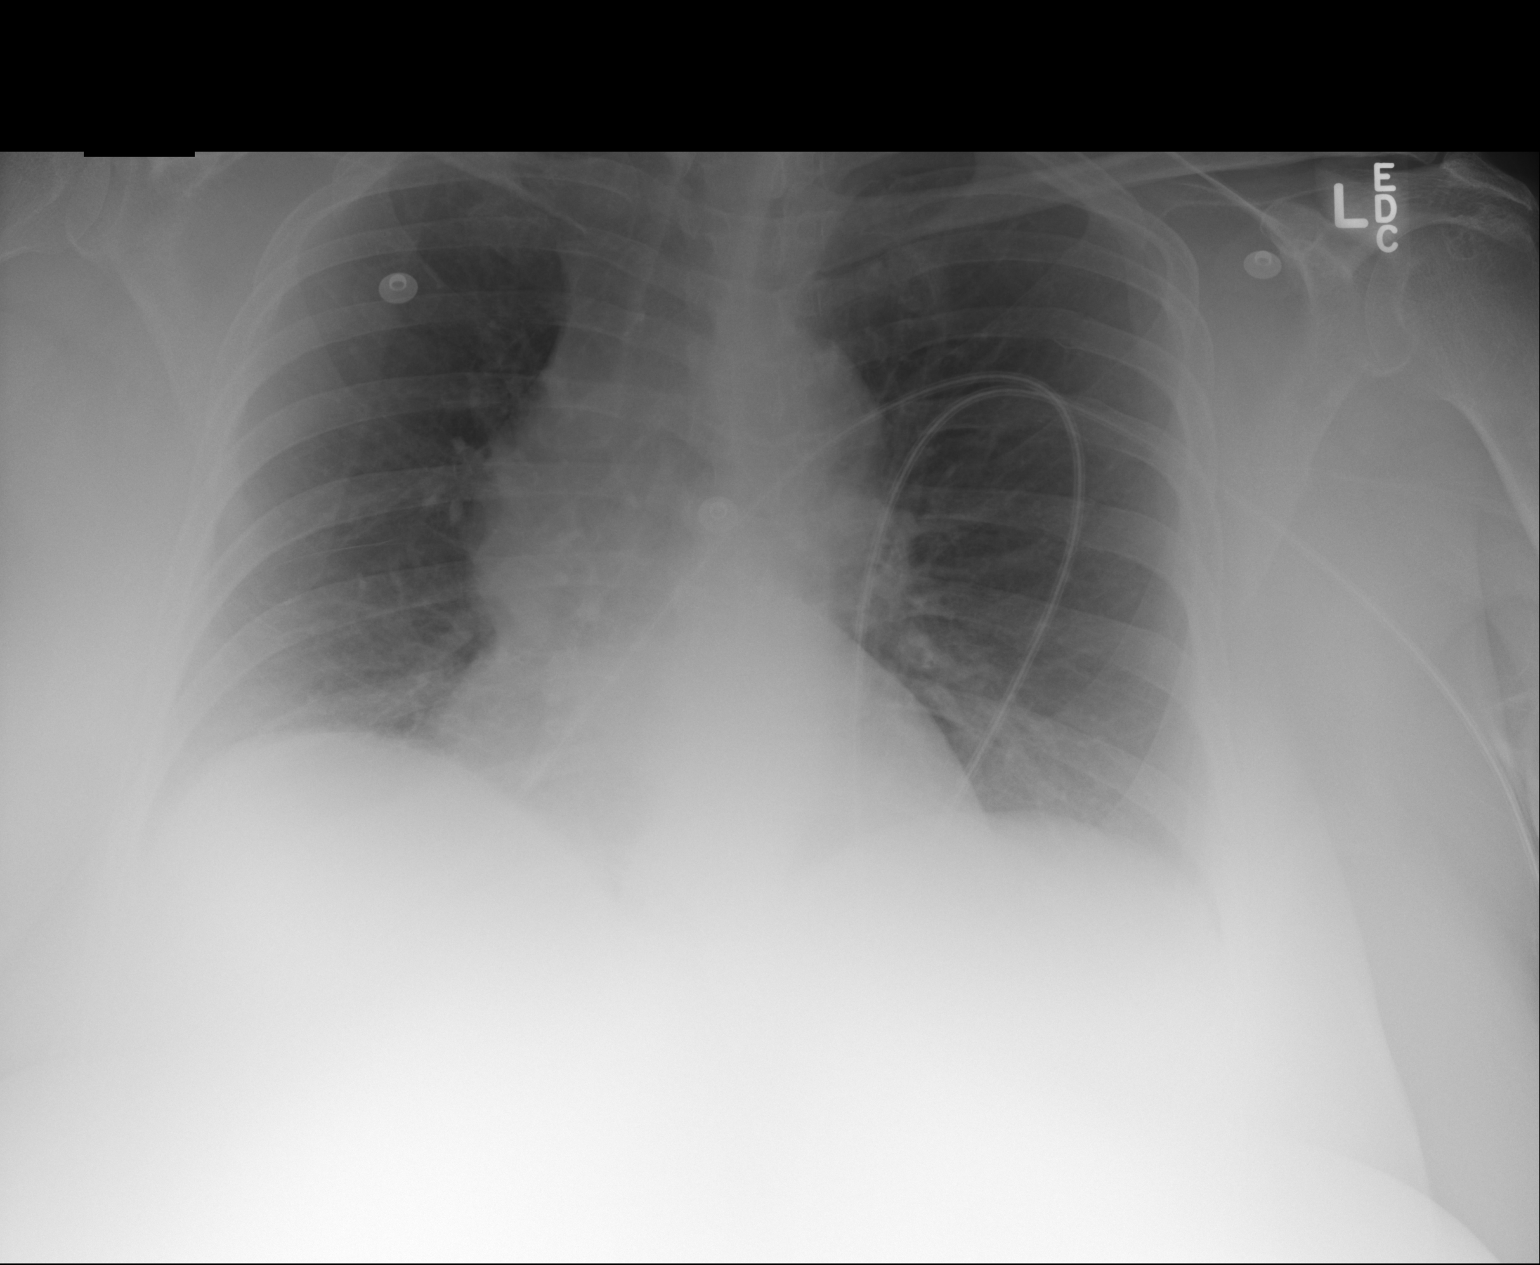

[1 of 1 positions shown; findings below may reference images not displayed]

FINDINGS: Heart size and pulmonary vascularity are normal and the lungs are
clear. There is tortuosity of the ascending thoracic aorta. No acute
osseous abnormality.
IMPRESSION: No acute disease.

## 2016-01-11 NOTE — Anesthesia Postprocedure Evaluation (Signed)
Anesthesia Post Note  Patient: KADIAN MURREY  Procedure(s) Performed: Procedure(s) (LRB): Craniectomy for wound debridement (N/A)  Patient location during evaluation: NICU Anesthesia Type: General Level of consciousness: awake and oriented Pain management: pain level controlled Vital Signs Assessment: post-procedure vital signs reviewed and stable Respiratory status: spontaneous breathing, nonlabored ventilation and respiratory function stable Cardiovascular status: blood pressure returned to baseline Anesthetic complications: no    Last Vitals:  Filed Vitals:   01/05/16 0919 01/05/16 1348  BP: 108/70 110/68  Pulse: 92 90  Temp: 36.8 C 36.7 C  Resp: 20 19    Last Pain:  Filed Vitals:   01/05/16 1607  PainSc: 3                  Gari Hartsell COKER

## 2016-01-17 ENCOUNTER — Emergency Department: Payer: Medicare Other

## 2016-01-17 ENCOUNTER — Emergency Department
Admission: EM | Admit: 2016-01-17 | Discharge: 2016-01-17 | Disposition: A | Discharge status: Home / Self Care | Payer: Medicare Other | Attending: Emergency Medicine | Admitting: Emergency Medicine

## 2016-01-17 DIAGNOSIS — F329 Major depressive disorder, single episode, unspecified: Secondary | ICD-10-CM | POA: Insufficient documentation

## 2016-01-17 DIAGNOSIS — A419 Sepsis, unspecified organism: Secondary | ICD-10-CM | POA: Insufficient documentation

## 2016-01-17 DIAGNOSIS — Z85841 Personal history of malignant neoplasm of brain: Secondary | ICD-10-CM | POA: Diagnosis not present

## 2016-01-17 DIAGNOSIS — R4182 Altered mental status, unspecified: Secondary | ICD-10-CM | POA: Insufficient documentation

## 2016-01-17 DIAGNOSIS — Z91018 Allergy to other foods: Secondary | ICD-10-CM | POA: Insufficient documentation

## 2016-01-17 DIAGNOSIS — F209 Schizophrenia, unspecified: Secondary | ICD-10-CM | POA: Insufficient documentation

## 2016-01-17 DIAGNOSIS — Z8673 Personal history of transient ischemic attack (TIA), and cerebral infarction without residual deficits: Secondary | ICD-10-CM | POA: Insufficient documentation

## 2016-01-17 DIAGNOSIS — Z7951 Long term (current) use of inhaled steroids: Secondary | ICD-10-CM | POA: Diagnosis not present

## 2016-01-17 DIAGNOSIS — Z79899 Other long term (current) drug therapy: Secondary | ICD-10-CM | POA: Insufficient documentation

## 2016-01-17 DIAGNOSIS — I129 Hypertensive chronic kidney disease with stage 1 through stage 4 chronic kidney disease, or unspecified chronic kidney disease: Secondary | ICD-10-CM | POA: Insufficient documentation

## 2016-01-17 DIAGNOSIS — Z8719 Personal history of other diseases of the digestive system: Secondary | ICD-10-CM | POA: Insufficient documentation

## 2016-01-17 DIAGNOSIS — R55 Syncope and collapse: Secondary | ICD-10-CM | POA: Diagnosis present

## 2016-01-17 DIAGNOSIS — F17210 Nicotine dependence, cigarettes, uncomplicated: Secondary | ICD-10-CM | POA: Diagnosis not present

## 2016-01-17 DIAGNOSIS — E1122 Type 2 diabetes mellitus with diabetic chronic kidney disease: Secondary | ICD-10-CM | POA: Diagnosis not present

## 2016-01-17 DIAGNOSIS — N189 Chronic kidney disease, unspecified: Secondary | ICD-10-CM | POA: Diagnosis not present

## 2016-01-17 DIAGNOSIS — R0682 Tachypnea, not elsewhere classified: Secondary | ICD-10-CM | POA: Diagnosis not present

## 2016-01-17 LAB — URINALYSIS COMPLETE WITH MICROSCOPIC (ARMC ONLY)
BILIRUBIN URINE: NEGATIVE
Bacteria, UA: NONE SEEN
GLUCOSE, UA: NEGATIVE mg/dL
HGB URINE DIPSTICK: NEGATIVE
Ketones, ur: NEGATIVE mg/dL
LEUKOCYTES UA: NEGATIVE
NITRITE: NEGATIVE
Protein, ur: 30 mg/dL — AB
Specific Gravity, Urine: 1.015 (ref 1.005–1.030)
pH: 5 (ref 5.0–8.0)

## 2016-01-17 LAB — LACTIC ACID, PLASMA: LACTIC ACID, VENOUS: 1.1 mmol/L (ref 0.5–1.9)

## 2016-01-17 LAB — PROTIME-INR
INR: 1.43
PROTHROMBIN TIME: 17.5 s — AB (ref 11.4–15.0)

## 2016-01-17 LAB — BRAIN NATRIURETIC PEPTIDE: B NATRIURETIC PEPTIDE 5: 10 pg/mL (ref 0.0–100.0)

## 2016-01-17 LAB — CBC WITH DIFFERENTIAL/PLATELET
Basophils Absolute: 0 10*3/uL (ref 0–0.1)
Basophils Relative: 0 %
EOS ABS: 0.3 10*3/uL (ref 0–0.7)
Eosinophils Relative: 4 %
HEMATOCRIT: 29.7 % — AB (ref 35.0–47.0)
HEMOGLOBIN: 9.2 g/dL — AB (ref 12.0–16.0)
LYMPHS ABS: 1.4 10*3/uL (ref 1.0–3.6)
Lymphocytes Relative: 25 %
MCH: 23.4 pg — AB (ref 26.0–34.0)
MCHC: 31.1 g/dL — AB (ref 32.0–36.0)
MCV: 75.3 fL — ABNORMAL LOW (ref 80.0–100.0)
MONO ABS: 0.8 10*3/uL (ref 0.2–0.9)
MONOS PCT: 14 %
NEUTROS PCT: 57 %
Neutro Abs: 3.2 10*3/uL (ref 1.4–6.5)
Platelets: 183 10*3/uL (ref 150–440)
RBC: 3.95 MIL/uL (ref 3.80–5.20)
RDW: 22.4 % — AB (ref 11.5–14.5)
WBC: 5.7 10*3/uL (ref 3.6–11.0)

## 2016-01-17 LAB — COMPREHENSIVE METABOLIC PANEL
ALT: 32 U/L (ref 14–54)
AST: 54 U/L — AB (ref 15–41)
Albumin: 3.4 g/dL — ABNORMAL LOW (ref 3.5–5.0)
Alkaline Phosphatase: 191 U/L — ABNORMAL HIGH (ref 38–126)
Anion gap: 8 (ref 5–15)
BUN: 9 mg/dL (ref 6–20)
CHLORIDE: 110 mmol/L (ref 101–111)
CO2: 22 mmol/L (ref 22–32)
Calcium: 8.8 mg/dL — ABNORMAL LOW (ref 8.9–10.3)
Creatinine, Ser: 0.93 mg/dL (ref 0.44–1.00)
Glucose, Bld: 103 mg/dL — ABNORMAL HIGH (ref 65–99)
POTASSIUM: 3.6 mmol/L (ref 3.5–5.1)
SODIUM: 140 mmol/L (ref 135–145)
TOTAL PROTEIN: 8.5 g/dL — AB (ref 6.5–8.1)
Total Bilirubin: 1.5 mg/dL — ABNORMAL HIGH (ref 0.3–1.2)

## 2016-01-17 LAB — APTT: APTT: 31 s (ref 24–36)

## 2016-01-17 LAB — TYPE AND SCREEN
ABO/RH(D): A POS
Antibody Screen: POSITIVE

## 2016-01-17 LAB — TROPONIN I: Troponin I: <0.03 ng/mL (ref <0.03)

## 2016-01-17 MED ORDER — SODIUM CHLORIDE 0.9 % IV BOLUS (SEPSIS)
1000.0000 mL | Freq: Once | INTRAVENOUS | Status: AC
  Administered 2016-01-17: 1000 mL via INTRAVENOUS

## 2016-01-17 MED ORDER — VANCOMYCIN HCL IN DEXTROSE 1-5 GM/200ML-% IV SOLN
1000.0000 mg | Freq: Once | INTRAVENOUS | Status: AC
  Administered 2016-01-17: 1000 mg via INTRAVENOUS
  Filled 2016-01-17: qty 200

## 2016-01-17 MED ORDER — DEXTROSE 5 % IV SOLN
2.0000 g | Freq: Once | INTRAVENOUS | Status: AC
  Administered 2016-01-17: 2 g via INTRAVENOUS
  Filled 2016-01-17: qty 2

## 2016-01-17 NOTE — ED Notes (Signed)
PICC team called, ETA 1900.

## 2016-01-17 NOTE — Progress Notes (Signed)
[facility]  Patient Status: Active  [facility] is providing the following services: SN/ IV ABX (Vancomycin and Invanz ordered thru 02/19/16)  If patient discharges after hours, please call [phone].   [name] 01/17/2016, 2:34 PM

## 2016-01-17 NOTE — Progress Notes (Signed)
   01/17/16 1400  Clinical Encounter Type  Visited With Patient and family together  Visit Type Initial  Consult/Referral To [place] visited with patient and mother. Mother is dealing with emotional trauma from a prior family members death at hospital that is causing great anxiety with daughters visit today. Mother would benefit from a follow up visit if patient is admitted.   [place] [phone]

## 2016-01-17 NOTE — ED Provider Notes (Signed)
-----------------------------------------   7:32 PM on 01/17/2016 -----------------------------------------   Blood pressure 125/84, pulse 97, temperature 97.9 F (36.6 C), temperature source Oral, resp. rate 17, weight 250 lb (113.399 kg), SpO2 98 %.  Assuming care from Dr. Edd Fabian.  In short, [name] is a 60 y.o. female with a chief complaint of Code Sepsis .  Refer to the original H&P for additional details.  The current plan of care is to discharge the patient after discussion of the results on her studies with the neurosurgeon. I spoke to Dr. Curt Bears. Neurosurgeon at [facility] andthe patient well. Her laboratory work appears to be normal for the patient. The patient's PICC line was not working and apparently the PICC line tech came and flush the PICC line. The patient's otherwise hemodynamically stable improved with IV fluids and is scheduled to continue her IV antibiotic therapy at home. As follow-up arranged with the neurosurgeon tomorrow. Review of her laboratory work shows a normal lactic acid level, normal white blood cell level, improvement and her anemia, etc. No reason medically to hospitalize the patient at this time. She will be discharged home with family member. Patient appears to be in agreement with the plan and understanding of her follow-up etc.   [name], MD 01/17/16 [phone]

## 2016-01-17 NOTE — ED Notes (Signed)
Delay in antibiotics and blood work r/t difficulty obtaining IV access.  Multiple attempts done by RNs, including [place] IV attempts.  MD Edd Fabian informed.  MD Edd Fabian did [place] IV in R arm.  PICC line in pts R arm unusable.  When pt came in, blood was in line.  Pt sts that PICC line used today, last documented PICC line use 7/10.  Unable to draw back on PICC line.  Clot removed as witnessed by Lanny Hurst EMT from line at extension site.

## 2016-01-17 NOTE — Discharge Instructions (Signed)

## 2016-01-17 NOTE — ED Notes (Signed)
Pt returned from CT °

## 2016-01-17 NOTE — ED Provider Notes (Signed)
[facility] Emergency Department Provider Note   ____________________________________________  Time seen: Approximately 12:55 PM  I have reviewed the triage vital signs and the nursing notes.   HISTORY  Chief Complaint Code Sepsis    HPI [name] is a 60 y.o. female with multiple co-morbidities, discharged from [facility] on 01/05/16 after having complications related to  right panhemispheric subdural hematoma evacuated on 2/18 with bone flap removed and temporarily imbedded in abdomen and osteomyelitis of the skull followed by Dr. Cyndy Freeze of neurosurgery who removed the bone flap and closed the scalp primarily, on 6 weeks of IV vancomycin and ertapenem by PICC line who presents for evaluation of altered mental status today, syncopal episodes, gradual onset, constant, moderate to severe, no modifying factors. Patient reports that she "fell out" earlier this morning. Initially, she refused transfer by EMS however according to her family she was severely altered and sewed 911 was again called. On EMS arrival she had stable vital signs and a normal blood sugar. She denies any chest pain or difficulty breathing, no vomiting, diarrhea, fevers or chills. She is complaining of right-sided headache. Of note she has had a complicated recent medical history including sepsis secondary to urinary tract infection as well as bacteremia and also parotitis.   See excerpt below from [facility] infectious disease is old note on 12/29/2015. [name] is a 60 y.o. female with multiple co-morbidities, found to have right panhemispheric subdural hematoma evacuated on 2/18 with bone flap removed and temporarily imbedded in abdomen. Her complication from the surgery included having parotitis where she was initially on vancomycin and discharged on clindamycin. She was also given cephalexin, metronidazole and fluconazole on discharge. She was readmitted on 3/10 for cranioplasty which she did  well,again still had parotitis, and given a 2nd course of clindamycin. She was readmitted to the [facility] from 4/16-4/29 for sepsis of urinary source, multiple organisims isolated on culture including vre, psa. She reports having wound drainage from her scalp for the last 3-4 wk per her daughter. When see by dr. Christella Noa, she had scalp, hardware exposure concerning for bone flap infection. She was admitted to the hospital, wound described as Open scalp wound approximately 8 x 2.5 x 2.5 with 2 cm undermining through out. Purulent tan drainage. Visible bone and screws .it is unclear if patient was on oral abtx prior to surgery. cx results at 24hr no growth to date. She was started on vancomycin.   Past Medical History  Diagnosis Date  . Emphysema of lung ([place])   . Depression   . Diverticulitis   . Chronic bronchitis ([place])   . Malignant brain tumor ([place]) 2007  . GERD (gastroesophageal reflux disease)   . Allergy   . Hepatitis C   . Hypertension   . Chronic kidney disease   . Migraines   . Urine incontinence   . Seizures ([place])   . Stroke [facility]) 2007    during brain surgery  . Pancreatitis, alcoholic 0000000  . Rectovaginal fistula   . H/O ETOH abuse     Sober since 2009  . H/O drug abuse     Clean since 2009  . Pancreatic ascites   . Cirrhosis ([place])   . Diabetes mellitus without complication [facility])     Patient Active Problem List   Diagnosis Date Noted  . Osteomyelitis of skull ([place])   . Encephalopathy   . Status post craniectomy 12/28/2015  . Wound infection after surgery 12/26/2015  . UTI (lower  urinary tract infection) 10/22/2015  . Chest pain on breathing   . SOB (shortness of breath)   . Wheezing   . Subdural hematoma ([place]) 08/16/2015  . Sepsis ([place]) 06/22/2015  . Orthostatic hypotension 05/15/2015  . ALC (alcoholic liver cirrhosis) ([place]) 12/23/2014  . Clinical depression 12/23/2014  . Anxiety 12/23/2014  . Diabetes mellitus, type 2 ([place]) 12/23/2014  . HTN (hypertension)  11/05/2014  . Liver cirrhosis ([place]) 07/19/2014  . 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 Chronic pancreatitis ([place]) 11/03/2012  . Seizure disorder ([place]) 11/03/2012  . Benign meningioma of brain ([place]) 11/03/2012  . Chronic pelvic pain in female 11/03/2012  . Hepatitis C virus infection without hepatic coma 11/03/2012  . UTI (urinary tract infection) 11/03/2012  . Alcohol abuse, unspecified 11/03/2012  . Chronic liver disease 11/03/2012  . Schizophrenia ([place]) 11/03/2012  . Narcotic abuse 11/03/2012  . 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 Breast screening 11/03/2012  . 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 Nondependent alcohol abuse 11/03/2012  . 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 Female genital symptoms 11/03/2012    Past Surgical History  Procedure Laterality Date  . Appendectomy  1999  . Eye surgery    . Rectovaginal fistula repair w/ colostomy  2011  . Brain surgery  2007    malignant brain tumor  . Cholecystectomy  2001  . Abdominal hysterectomy  1979  . Craniotomy Right 08/16/2015    Procedure: Right Fronto-Temporal-Parietal Craniotomy for Evacuation of Hematoma with Bone Flap Placement in Abdomen;  Surgeon: Ashok Pall, MD;  Location: [place] [facility]URO ORS;  Service: Neurosurgery;  Laterality: Right;  . Cranioplasty N/A 09/15/2015    Procedure: Cranioplasty with retrieval of bone flap from abdominal pocket;  Surgeon: Ashok Pall, MD;  Location: [place] NEUR[facility] ORS;  Service: Neurosurgery;  Laterality: N/A;  Cranioplasty with retrieval of bone flap from abdominal pocket  . Craniotomy N/A 12/28/2015    Procedure: Craniectomy for wound debridement;  Surgeon: Ashok Pall, MD;  Location: [place] NEURO ORS;  Service: Neurosurgery;  Laterality: N/A;  Craniectomy for wound debridement    Current Outpatient Rx  Name  Route  Sig  Dispense  Refill  . albuterol (PROVENTIL HFA;VENTOLIN HFA) 108 (90 Base) MCG/ACT inhaler   Inhalation    Inhale 2 puffs into the lungs every 6 (six) hours as needed for wheezing or shortness of breath.         Marland Kitchen albuterol (PROVENTIL) (5 MG/ML) 0.5% nebulizer solution   Nebulization   Take 2.5 mg by nebulization every 6 (six) hours as needed for wheezing or shortness of breath.          . ALPRAZolam (XANAX) 1 MG tablet   Oral   Take 1 tablet (1 mg total) by mouth 2 (two) times daily as needed for anxiety.   90 tablet   0   . amitriptyline (ELAVIL) 50 MG tablet   Oral   Take 1 tablet (50 mg total) by mouth at bedtime.   30 tablet   3   . amLODipine (NORVASC) 10 MG tablet   Oral   Take 10 mg by mouth daily.         . cefdinir (OMNICEF) 300 MG capsule   Oral   Take 300 mg by mouth daily.         . diphenhydrAMINE (BENADRYL) 25 mg capsule   Oral   Take 1 capsule (25 mg total) by mouth every 8 (eight) hours as needed for  itching.   30 capsule   0   . ertapenem 1 g in sodium chloride 0.9 % 50 mL   Intravenous   Inject 1 g into the vein daily.   1 g   42   . Fluticasone-Salmeterol (ADVAIR) 250-50 MCG/DOSE AEPB   Inhalation   Inhale 2 puffs into the lungs 2 (two) times daily.         . furosemide (LASIX) 20 MG tablet   Oral   Take 20 mg by mouth daily.         Marland Kitchen gabapentin (NEURONTIN) 300 MG capsule   Oral   Take 600 mg by mouth 4 (four) times daily.          . hydrOXYzine (ATARAX/VISTARIL) 25 MG tablet   Oral   Take 25 mg by mouth 3 (three) times daily.         . hydrOXYzine (VISTARIL) 50 MG capsule   Oral   Take 50 mg by mouth 4 (four) times daily as needed for anxiety.         . Ipratropium-Albuterol (COMBIVENT) 20-100 MCG/ACT AERS respimat   Inhalation   Inhale 2 puffs into the lungs every 6 (six) hours as needed for wheezing or shortness of breath.          . levETIRAcetam (KEPPRA) 750 MG tablet   Oral   Take 750 mg by mouth 2 (two) times daily.         . lipase/protease/amylase (CREON) 12000 units CPEP capsule   Oral   Take 24,000  Units by mouth 3 (three) times daily with meals.         . metFORMIN (GLUCOPHAGE) 1000 MG tablet   Oral   Take 1,000 mg by mouth 2 (two) times daily.         Marland Kitchen omeprazole (PRILOSEC) 40 MG capsule   Oral   Take 40 mg by mouth daily.         . Oxycodone HCl 20 MG TABS   Oral   Take 1 tablet (20 mg total) by mouth every 12 (twelve) hours as needed. Patient taking differently: Take 1 tablet by mouth 3 (three) times daily as needed (for pain).    120 tablet   0   . pravastatin (PRAVACHOL) 10 MG tablet   Oral   Take 10 mg by mouth daily.          . promethazine (PHENERGAN) 25 MG tablet   Oral   Take 25 mg by mouth every 8 (eight) hours as needed for nausea or vomiting.         . sertraline (ZOLOFT) 100 MG tablet   Oral   Take 200 mg by mouth daily.         Marland Kitchen spironolactone (ALDACTONE) 50 MG tablet   Oral   Take 1 tablet (50 mg total) by mouth 2 (two) times daily.   60 tablet   0   . sucralfate (CARAFATE) 1 g tablet   Oral   Take 1 g by mouth 4 (four) times daily.         Marland Kitchen terconazole (TERAZOL 7) 0.4 % vaginal cream   Vaginal   Place 1 applicator vaginally at bedtime. x7day   45 g   0   . tiotropium (SPIRIVA) 18 MCG inhalation capsule   Inhalation   Place 18 mcg into inhaler and inhale daily.          Marland Kitchen triamcinolone (NASACORT ALLERGY 24HR) 55 MCG/ACT AERO nasal  inhaler   Nasal   Place 2 sprays into the nose daily as needed (for rhinitis).         . vancomycin 1,250 mg in sodium chloride 0.9 % 250 mL   Intravenous   Inject 1,250 mg into the vein every 12 (twelve) hours.   1250 mg   42   . zolpidem (AMBIEN) 10 MG tablet   Oral   Take 1 tablet (10 mg total) by mouth at bedtime as needed for sleep.   30 tablet   0   . nystatin ointment (MYCOSTATIN)   Topical   Apply 1 application topically 2 (two) times daily. Provide powder please if available. Patient not taking: Reported on 01/17/2016   30 g   0   . oxyCODONE-acetaminophen (PERCOCET)  10-325 MG tablet   Oral   Take 1-2 tablets by mouth every 6 (six) hours as needed for pain. Patient not taking: Reported on 01/17/2016   70 tablet   0     Allergies Ibuprofen; Sulfa antibiotics; Amoxicillin; Chocolate; Ciprofloxacin; Dilaudid; Fentanyl; Hydromorphone; Metformin; Penicillins; Strawberry extract; and Zofran  Family History  Problem Relation Age of Onset  . Hypertension Mother   . Heart disease Father   . Diabetes Father   . Cancer Sister     brain  . Cancer Grandchild 8    brain tumor    Social History Social History  Substance Use Topics  . Smoking status: Light Tobacco Smoker -- 0.50 packs/day    Types: Cigarettes  . Smokeless tobacco: Never Used     Comment: cutting back  . Alcohol Use: 2.4 oz/week    4 Cans of beer, 0 Standard drinks or equivalent per week    Review of Systems Constitutional: No fever/chills Eyes: No visual changes. ENT: No sore throat. Cardiovascular: Denies chest pain. Respiratory: Denies shortness of breath. Gastrointestinal: No abdominal pain.  No nausea, no vomiting.  No diarrhea.  No constipation. Genitourinary: Negative for dysuria. Musculoskeletal: Negative for back pain. Skin: Negative for rash. Neurological: Positive for headaches, no focal weakness or numbness.  10-point ROS otherwise negative.  ____________________________________________   PHYSICAL EXAM:  Filed Vitals:   01/17/16 1305 01/17/16 1310 01/17/16 1452 01/17/16 1500  BP: 120/96  133/74 97/66  Pulse: 110  100 101  Temp:  97.9 F (36.6 C)    TempSrc:  Oral    Resp: 21  20 18   Weight: 250 lb (113.399 kg)     SpO2:   100% 99%    VITAL SIGNS: ED Triage Vitals  Enc Vitals Group     BP --      Pulse --      Resp --      Temp --      Temp src --      SpO2 --      Weight --      Height --      Head Cir --      Peak Flow --      Pain Score --      Pain Loc --      Pain Edu? --      Excl. in [place]? --     Constitutional: Alert and oriented.  Chronically ill- appearing and in no acute distress. Eyes: Conjunctivae are normal. PERRL. EOMI. Head: Right scalp wound closed with suture, no purulence, drainage, erythema. Nose: No congestion/rhinnorhea. Mouth/Throat: Mucous membranes are moist.  Oropharynx non-erythematous. Neck: No stridor.  Supple without meningismus. Cardiovascular: Tachycardic rate, regular  rhythm. Grossly normal heart sounds.  Good peripheral circulation. Respiratory: Normal respiratory effort.  No retractions. Lungs CTAB. Mild tachypnea. Gastrointestinal: Soft and nontender. No distention.  No CVA tenderness. Genitourinary: deferred Musculoskeletal: No lower extremity tenderness nor edema.  No joint effusions. PICC line in the right arm. Neurologic:  Normal speech and language. No gross focal neurologic deficits are appreciated. 5 out of 5 strength in bilateral upper and lower extremities, sensation intact to light touch throughout. Skin:  Skin is warm, dry and intact. No rash noted. Psychiatric: Mood and affect are normal. Speech and behavior are normal.  ____________________________________________   LABS (all labs ordered are listed, but only abnormal results are displayed)  Labs Reviewed  COMPREHENSIVE METABOLIC PANEL - Abnormal; Notable for the following:    Glucose, Bld 103 (*)    Calcium 8.8 (*)    Total Protein 8.5 (*)    Albumin 3.4 (*)    AST 54 (*)    Alkaline Phosphatase 191 (*)    Total Bilirubin 1.5 (*)    All other components within normal limits  CBC WITH DIFFERENTIAL/PLATELET - Abnormal; Notable for the following:    Hemoglobin 9.2 (*)    HCT 29.7 (*)    MCV 75.3 (*)    MCH 23.4 (*)    MCHC 31.1 (*)    RDW 22.4 (*)    All other components within normal limits  PROTIME-INR - Abnormal; Notable for the following:    Prothrombin Time 17.5 (*)    All other components within normal limits  URINALYSIS COMPLETEWITH MICROSCOPIC ([facility] ONLY) - Abnormal; Notable for the following:    Color,  Urine YELLOW (*)    APPearance CLEAR (*)    Protein, ur 30 (*)    Squamous Epithelial / LPF 0-5 (*)    All other components within normal limits  CULTURE, BLOOD (ROUTINE X 2)  CULTURE, BLOOD (ROUTINE X 2)  URINE CULTURE  LACTIC ACID, PLASMA  BRAIN NATRIURETIC PEPTIDE  TROPONIN I  APTT  LACTIC ACID, PLASMA  TYPE AND SCREEN   ____________________________________________  EKG  ED ECG REPORT I, Joanne Gavel, the attending physician, personally viewed and interpreted this ECG.   Date: 01/17/2016  EKG Time: 13:06  Rate: 109  Rhythm: sinus tachycardia  Axis: normal  Intervals:none  ST&T Change: No acute ST elevation or acute ST depression.  ____________________________________________  RADIOLOGY  CXR and CT head pending ____________________________________________   PROCEDURES  Procedure(s) performed: None  Procedures  Critical Care performed: Yes, see critical care note(s). Total critical care time spent 30 minutes.  ____________________________________________   INITIAL IMPRESSION / ASSESSMENT AND PLAN / ED COURSE  Pertinent labs & imaging results that were available during my care of the patient were reviewed by me and considered in my medical decision making (see chart for details).  [name] is a 60 y.o. female with multiple co-morbidities, discharged from [facility] on 01/05/16 after having complications related to  right panhemispheric subdural hematoma evacuated on 2/18 with bone flap removed and temporarily imbedded in abdomen and osteomyelitis of the skull followed by Dr. Cyndy Freeze of neurosurgery who removed the bone flap, on 6 weeks of IV vancomycin and ertapenem by PICC line who presents for evaluation of altered mental status today, syncopal episodes. On arrival to the emergency department she was tachycardic and tachypneic meeting 2 out of 4 criteria for Sirs, good sepsis initiated, we'll give IV fluids, vancomycin and aztreonam. She is now awake, mentating  appropriately with an intact neurological  examination. Plan for screening labs, CT head, chest x-ray, urinalysis and likely transfer back to [facility].   ----------------------------------------- 3:40 PM on 01/17/2016 -----------------------------------------  Patient with improvement of her heart rate at this time with the above treatments. She is maintaining maps greater than 60, reassuring lactic acid of 1.1, not consistent with septic shock so will not give full 30 mL/kg bolus of normal saline at this time. CMP unremarkable generally with the exception of mild AST elevation and mild hypocalcemia. Negative troponin. CBC with chronic stable anemia, hemoglobin 9.2. Urinalysis is not consistent with infection. The patient's PICC line in the right arm is not functioning, we have not been able to flush it since arrival to the emergency department. I briefly discussed the case with Dr. Cyndy Freeze of [facility] of neurosurgery who is aware of the patient and plan to likely transfer her to the [facility] medicine service with neurosurgery consulting unless the CT scan of her head shows very remarkable change. Care transferred to Dr. Marcelene Butte at this time pending imaging after which the patient will likely require transfer. ____________________________________________   FINAL CLINICAL IMPRESSION(S) / ED DIAGNOSES  Final diagnoses:  Altered mental status, unspecified altered mental status type  Sepsis, due to unspecified organism ([place])  Syncope, unspecified syncope type      NEW MEDICATIONS STARTED DURING THIS VISIT:  New Prescriptions   No medications on file     Note:  This document was prepared using Dragon voice recognition software and may include unintentional dictation errors.    Joanne Gavel, MD 01/17/16 [phone]

## 2016-01-17 NOTE — ED Notes (Signed)
Pt bib EMS w/ c/o weakness.  Per EMS, aid called EMS b/c pt more drowsy today, potentially took too many of her medications unintentionally.  Pt oriented to self and place, not time.  Pt able to answer questions but speaks slowly.  Pt c/o weakness, abd pain and sts she hasn't been able to keep down food for 2 days.  White froth noted in pts mouth.

## 2016-01-17 NOTE — Progress Notes (Signed)
Pharmacy Antibiotic Note  [name] is a 60 y.o. female admitted on 01/17/2016 with sepsis.  Pharmacy has been consulted for Aztreonam dosing.  Review of patient's chart shows patient was receiving Ertapenem 1gm daily  and Vancomycin 1250mg  every 12 hours at outpatient.   According to patient she received vancomcyin dose around 1100 today. Patient received Vancomycin 1gm IV x1 at 1400 in ED as well.   Plan: Aztreonam 2gm IV x1 dose was ordered for patient while in ED. According to ED physician patient is most likely going to be transferred out.   Will continue to follow patient for admission vs. transfer for next dose.   Weight: 250 lb (113.399 kg)  Temp (24hrs), Avg:97.9 F (36.6 C), Min:97.9 F (36.6 C), Max:97.9 F (36.6 C)   Recent Labs Lab 01/17/16 1432  WBC 5.7  CREATININE 0.93  LATICACIDVEN 1.1    Estimated Creatinine Clearance: 87.1 mL/min (by C-G formula based on Cr of 0.93).    Allergies  Allergen Reactions  . Ibuprofen Other (See Comments)    Pt states that she is unable to take this medication because of her liver.    . Sulfa Antibiotics Hives  . Amoxicillin Itching, Rash and Other (See Comments)    Has patient had a PCN reaction causing immediate rash, facial/tongue/throat swelling, SOB or lightheadedness with hypotension: No Has patient had a PCN reaction causing severe rash involving mucus membranes or skin necrosis: No Has patient had a PCN reaction that required hospitalization No Has patient had a PCN reaction occurring within the last 10 years: Yes If all of the above answers are "NO", then may proceed with Cephalosporin use.  . Chocolate Rash  . Ciprofloxacin Itching and Rash  . Dilaudid [Hydromorphone Hcl] Itching  . Fentanyl Rash  . Hydromorphone Itching  . Metformin Nausea And Vomiting  . Penicillins Itching, Rash and Other (See Comments)    Has patient had a PCN reaction causing immediate rash, facial/tongue/throat swelling, SOB or  lightheadedness with hypotension: No Has patient had a PCN reaction causing severe rash involving mucus membranes or skin necrosis: No Has patient had a PCN reaction that required hospitalization No Has patient had a PCN reaction occurring within the last 10 years: Yes If all of the above answers are "NO", then may proceed with Cephalosporin use.  . Strawberry Extract Rash  . Zofran [Ondansetron Hcl] Itching and Nausea And Vomiting    Microbiology results: 7/12 BCx: pending 7/12 UCx: pending  Thank you for allowing pharmacy to be a part of this patient's care.  Nancy Fetter, PharmD Clinical Pharmacist 01/17/2016 4:42 PM

## 2016-01-18 ENCOUNTER — Encounter: Payer: Self-pay | Admitting: Emergency Medicine

## 2016-01-18 ENCOUNTER — Emergency Department
Admission: EM | Admit: 2016-01-18 | Discharge: 2016-01-18 | Disposition: A | Discharge status: Home / Self Care | Payer: Medicare Other | Attending: Student | Admitting: Student

## 2016-01-18 DIAGNOSIS — Z79899 Other long term (current) drug therapy: Secondary | ICD-10-CM | POA: Insufficient documentation

## 2016-01-18 DIAGNOSIS — T82898A Other specified complication of vascular prosthetic devices, implants and grafts, initial encounter: Secondary | ICD-10-CM

## 2016-01-18 DIAGNOSIS — Z8673 Personal history of transient ischemic attack (TIA), and cerebral infarction without residual deficits: Secondary | ICD-10-CM | POA: Insufficient documentation

## 2016-01-18 DIAGNOSIS — Z85841 Personal history of malignant neoplasm of brain: Secondary | ICD-10-CM | POA: Insufficient documentation

## 2016-01-18 DIAGNOSIS — F17210 Nicotine dependence, cigarettes, uncomplicated: Secondary | ICD-10-CM | POA: Insufficient documentation

## 2016-01-18 DIAGNOSIS — Y828 Other medical devices associated with adverse incidents: Secondary | ICD-10-CM | POA: Diagnosis not present

## 2016-01-18 DIAGNOSIS — F209 Schizophrenia, unspecified: Secondary | ICD-10-CM | POA: Insufficient documentation

## 2016-01-18 DIAGNOSIS — I129 Hypertensive chronic kidney disease with stage 1 through stage 4 chronic kidney disease, or unspecified chronic kidney disease: Secondary | ICD-10-CM | POA: Diagnosis not present

## 2016-01-18 DIAGNOSIS — T82868A Thrombosis of vascular prosthetic devices, implants and grafts, initial encounter: Secondary | ICD-10-CM | POA: Insufficient documentation

## 2016-01-18 DIAGNOSIS — E1122 Type 2 diabetes mellitus with diabetic chronic kidney disease: Secondary | ICD-10-CM | POA: Insufficient documentation

## 2016-01-18 DIAGNOSIS — Z452 Encounter for adjustment and management of vascular access device: Secondary | ICD-10-CM | POA: Diagnosis present

## 2016-01-18 DIAGNOSIS — Z7951 Long term (current) use of inhaled steroids: Secondary | ICD-10-CM | POA: Insufficient documentation

## 2016-01-18 DIAGNOSIS — F329 Major depressive disorder, single episode, unspecified: Secondary | ICD-10-CM | POA: Diagnosis not present

## 2016-01-18 DIAGNOSIS — N189 Chronic kidney disease, unspecified: Secondary | ICD-10-CM | POA: Diagnosis not present

## 2016-01-18 LAB — URINE CULTURE: CULTURE: NO GROWTH

## 2016-01-18 MED ORDER — SODIUM CHLORIDE 0.9 % IV SOLN: 500.0000 mg | Freq: Once | INTRAVENOUS | Status: DC

## 2016-01-18 MED ORDER — VANCOMYCIN HCL IN DEXTROSE 750-5 MG/150ML-% IV SOLN: 750.0000 mg | Freq: Once | INTRAVENOUS | Status: DC

## 2016-01-18 MED ORDER — OXYCODONE HCL 5 MG PO TABS
10.0000 mg | ORAL_TABLET | Freq: Once | ORAL | Status: AC
  Administered 2016-01-18: 10 mg via ORAL
  Filled 2016-01-18: qty 2

## 2016-01-18 MED ORDER — SODIUM CHLORIDE 0.9 % IV BOLUS (SEPSIS): 500.0000 mL | Freq: Once | INTRAVENOUS | Status: DC

## 2016-01-18 NOTE — Discharge Instructions (Signed)
PICC Home Guide A peripherally inserted central catheter (PICC) is a long, thin, flexible tube that is inserted into a vein in the upper arm. It is a form of intravenous (IV) access. It is considered to be a "central" line because the tip of the PICC ends in a large vein in your chest. This large vein is called the superior vena cava (SVC). The PICC tip ends in the SVC because there is a lot of blood flow in the SVC. This allows medicines and IV fluids to be quickly distributed throughout the body. The PICC is inserted using a sterile technique by a specially trained nurse or physician. After the PICC is inserted, a chest X-ray exam is done to be sure it is in the correct place.  A PICC may be placed for different reasons, such as:  To give medicines and liquid nutrition that can only be given through a central line. Examples are:  Certain antibiotic treatments.  Chemotherapy.  Total parenteral nutrition (TPN).  To take frequent blood samples.  To give IV fluids and blood products.  If there is difficulty placing a peripheral intravenous (PIV) catheter. If taken care of properly, a PICC can remain in place for several months. A PICC can also allow a person to go home from the hospital early. Medicine and PICC care can be managed at home by a family member or home health care team. WHAT PROBLEMS CAN HAPPEN WHEN I HAVE A PICC? Problems with a PICC can occasionally occur. These may include the following:  A blood clot (thrombus) forming in or at the tip of the PICC. This can cause the PICC to become clogged. A clot-dissolving medicine called tissue plasminogen activator (tPA) can be given through the PICC to help break up the clot.  Inflammation of the vein (phlebitis) in which the PICC is placed. Signs of inflammation may include redness, pain at the insertion site, red streaks, or being able to feel a "cord" in the vein where the PICC is located.  Infection in the PICC or at the insertion  site. Signs of infection may include fever, chills, redness, swelling, or pus drainage from the PICC insertion site.  PICC movement (malposition). The PICC tip may move from its original position due to excessive physical activity, forceful coughing, sneezing, or vomiting.  A break or cut in the PICC. It is important to not use scissors near the PICC.  Nerve or tendon irritation or injury during PICC insertion. WHAT SHOULD I KEEP IN MIND ABOUT ACTIVITIES WHEN I HAVE A PICC?  You may bend your arm and move it freely. If your PICC is near or at the bend of your elbow, avoid activity with repeated motion at the elbow.  Rest at home for the remainder of the day following PICC line insertion.  Avoid lifting heavy objects as instructed by your health care provider.  Avoid using a crutch with the arm on the same side as your PICC. You may need to use a walker. WHAT SHOULD I KNOW ABOUT MY PICC DRESSING?  Keep your PICC bandage (dressing) clean and dry to prevent infection.  Ask your health care provider when you may shower. Ask your health care provider to teach you how to wrap the PICC when you do take a shower.  Change the PICC dressing as instructed by your health care provider.  Change your PICC dressing if it becomes loose or wet. WHAT SHOULD I KNOW ABOUT PICC CARE?  Check the PICC insertion site   daily for leakage, redness, swelling, or pain.  Do not take a bath, swim, or use hot tubs when you have a PICC. Cover PICC line with clear plastic wrap and tape to keep it dry while showering.  Flush the PICC as directed by your health care provider. Let your health care provider know right away if the PICC is difficult to flush or does not flush. Do not use force to flush the PICC.  Do not use a syringe that is less than 10 mL to flush the PICC.  Never pull or tug on the PICC.  Avoid blood pressure checks on the arm with the PICC.  Keep your PICC identification card with you at all  times.  Do not take the PICC out yourself. Only a trained clinical professional should remove the PICC. SEEK IMMEDIATE MEDICAL CARE IF:  Your PICC is accidentally pulled all the way out. If this happens, cover the insertion site with a bandage or gauze dressing. Do not throw the PICC away. Your health care provider will need to inspect it.  Your PICC was tugged or pulled and has partially come out. Do not  push the PICC back in.  There is any type of drainage, redness, or swelling where the PICC enters the skin.  You cannot flush the PICC, it is difficult to flush, or the PICC leaks around the insertion site when it is flushed.  You hear a "flushing" sound when the PICC is flushed.  You have pain, discomfort, or numbness in your arm, shoulder, or jaw on the same side as the PICC.  You feel your heart "racing" or skipping beats.  You notice a hole or tear in the PICC.  You develop chills or a fever. MAKE SURE YOU:   Understand these instructions.  Will watch your condition.  Will get help right away if you are not doing well or get worse.   This information is not intended to replace advice given to you by your health care provider. Make sure you discuss any questions you have with your health care provider.   Document Released: 12/29/2002 Document Revised: 07/15/2014 Document Reviewed: 03/01/2013 Elsevier Interactive Patient Education 2016 [employer]  

## 2016-01-18 NOTE — ED Notes (Signed)
Pt informed of when PICC line staff will be here and given warm blankets, lights dimmed. Pt denies further needs. Pt alert and oriented X4, active, cooperative, pt in NAD. RR even and unlabored, color WNL.

## 2016-01-18 NOTE — ED Notes (Addendum)
PICC line staff coming to evaluate patient and PICC line at 230 PM per MD.

## 2016-01-18 NOTE — ED Notes (Signed)
Pt states PICC line clogged since this AM

## 2016-01-18 NOTE — ED Notes (Signed)
Reviewed d/c instructions, follow-up care with pt. Pt verbalized understanding 

## 2016-01-18 NOTE — ED Provider Notes (Signed)
[facility] Emergency Department Provider Note   ____________________________________________  Time seen: Approximately 1:01 PM  I have reviewed the triage vital signs and the nursing notes.   HISTORY  Chief Complaint Vascular Access Problem    HPI AGGIE DOUSE is a 60 y.o. female with multiple co-morbidities, discharged from [facility] on 01/05/16 after having complications related to right panhemispheric subdural hematoma evacuated on 2/18 with bone flap removed and temporarily imbedded in abdomen and osteomyelitis of the skull followed by Dr. Cyndy Freeze of neurosurgery who removed the bone flap and closed the scalp primarily, on 6 weeks of IV vancomycin and ertapenem by PICC line who presents for evaluation for nonfunctioning PICC line. Patient was initially seen by me in this emergency department yesterday for altered mental status and possible syncopal episodes,  she met SIRS criteria by heart rate and respiratory rate. I transferred care to my colleague who reevaluated the patient after her labs and imaging had returned. He spoke with her neurosurgeon and discharged her home with neurosurgery follow-up today. When she was seen here yesterday, there was difficulty flushing her PICC line, the PICC team came in and were able to flush it. Today the picc is again occluded. She has no acute complaints today. Chest pain, no difficulty breathing, no recurrent episodes of syncope, no fevers.   Past Medical History  Diagnosis Date  . Emphysema of lung ([place])   . Depression   . Diverticulitis   . Chronic bronchitis ([place])   . Malignant brain tumor ([place]) 2007  . GERD (gastroesophageal reflux disease)   . Allergy   . Hepatitis C   . Hypertension   . Chronic kidney disease   . Migraines   . Urine incontinence   . Seizures ([place])   . Stroke [facility]) 2007    during brain surgery  . Pancreatitis, alcoholic 4696  . Rectovaginal fistula   . H/O ETOH abuse     Sober since 2009    . H/O drug abuse     Clean since 2009  . Pancreatic ascites   . Cirrhosis ([place])   . Diabetes mellitus without complication [facility])     Patient Active Problem List   Diagnosis Date Noted  . Osteomyelitis of skull ([place])   . Encephalopathy   . Status post craniectomy 12/28/2015  . Wound infection after surgery 12/26/2015  . UTI (lower urinary tract infection) 10/22/2015  . Chest pain on breathing   . SOB (shortness of breath)   . Wheezing   . Subdural hematoma ([place]) 08/16/2015  . Sepsis ([place]) 06/22/2015  . Orthostatic hypotension 05/15/2015  . ALC (alcoholic liver cirrhosis) ([place]) 12/23/2014  . Clinical depression 12/23/2014  . Anxiety 12/23/2014  . Diabetes mellitus, type 2 ([place]) 12/23/2014  . HTN (hypertension) 11/05/2014  . Liver cirrhosis ([place]) 07/19/2014  . 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 Chronic pancreatitis ([place]) 11/03/2012  . Seizure disorder ([place]) 11/03/2012  . Benign meningioma of brain ([place]) 11/03/2012  . Chronic pelvic pain in female 11/03/2012  . Hepatitis C virus infection without hepatic coma 11/03/2012  . UTI (urinary tract infection) 11/03/2012  . Alcohol abuse, unspecified 11/03/2012  . Chronic liver disease 11/03/2012  . Schizophrenia ([place]) 11/03/2012  . Narcotic abuse 11/03/2012  . 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 Breast screening 11/03/2012  . 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 Nondependent alcohol abuse 11/03/2012  . 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 Female genital symptoms 11/03/2012  Past Surgical History  Procedure Laterality Date  . Appendectomy  1999  . Eye surgery    . Rectovaginal fistula repair w/ colostomy  2011  . Brain surgery  2007    malignant brain tumor  . Cholecystectomy  2001  . Abdominal hysterectomy  1979  . Craniotomy Right 08/16/2015    Procedure: Right Fronto-Temporal-Parietal Craniotomy for Evacuation of Hematoma with Bone Flap  Placement in Abdomen;  Surgeon: Ashok Pall, MD;  Location: [place]UR[facility] ORS;  Service: Neurosurgery;  Laterality: Right;  . Cranioplasty N/A 09/15/2015    Procedure: Cranioplasty with retrieval of bone flap from abdominal pocket;  Surgeon: Ashok Pall, MD;  Location: [place] NEURO ORS;  Service: Neurosurgery;  Laterality: N/A;  Cranioplasty with retrieval of bone flap from abdominal pocket  . Craniotomy N/A 12/28/2015    Procedure: Craniectomy for wound debridement;  Surgeon: Ashok Pall, MD;  Location: [place] NEURO ORS;  Service: Neurosurgery;  Laterality: N/A;  Craniectomy for wound debridement    Current Outpatient Rx  Name  Route  Sig  Dispense  Refill  . albuterol (PROVENTIL HFA;VENTOLIN HFA) 108 (90 Base) MCG/ACT inhaler   Inhalation   Inhale 2 puffs into the lungs every 6 (six) hours as needed for wheezing or shortness of breath.         Marland Kitchen albuterol (PROVENTIL) (5 MG/ML) 0.5% nebulizer solution   Nebulization   Take 2.5 mg by nebulization every 6 (six) hours as needed for wheezing or shortness of breath.          . ALPRAZolam (XANAX) 1 MG tablet   Oral   Take 1 tablet (1 mg total) by mouth 2 (two) times daily as needed for anxiety.   90 tablet   0   . amitriptyline (ELAVIL) 50 MG tablet   Oral   Take 1 tablet (50 mg total) by mouth at bedtime.   30 tablet   3   . amLODipine (NORVASC) 10 MG tablet   Oral   Take 10 mg by mouth daily.         . diphenhydrAMINE (BENADRYL) 25 mg capsule   Oral   Take 1 capsule (25 mg total) by mouth every 8 (eight) hours as needed for itching.   30 capsule   0   . Fluticasone-Salmeterol (ADVAIR) 250-50 MCG/DOSE AEPB   Inhalation   Inhale 2 puffs into the lungs 2 (two) times daily.         . furosemide (LASIX) 20 MG tablet   Oral   Take 20 mg by mouth daily.         Marland Kitchen gabapentin (NEURONTIN) 300 MG capsule   Oral   Take 600 mg by mouth 4 (four) times daily.          . hydrOXYzine (ATARAX/VISTARIL) 25 MG tablet   Oral   Take 25 mg  by mouth 3 (three) times daily.         . Ipratropium-Albuterol (COMBIVENT) 20-100 MCG/ACT AERS respimat   Inhalation   Inhale 2 puffs into the lungs every 6 (six) hours as needed for wheezing or shortness of breath.          . levETIRAcetam (KEPPRA) 750 MG tablet   Oral   Take 750 mg by mouth 2 (two) times daily.         . metFORMIN (GLUCOPHAGE) 1000 MG tablet   Oral   Take 1,000 mg by mouth 2 (two) times daily.         . pravastatin (  PRAVACHOL) 10 MG tablet   Oral   Take 10 mg by mouth daily.          . promethazine (PHENERGAN) 25 MG tablet   Oral   Take 25 mg by mouth every 8 (eight) hours as needed for nausea or vomiting.         Marland Kitchen spironolactone (ALDACTONE) 50 MG tablet   Oral   Take 1 tablet (50 mg total) by mouth 2 (two) times daily.   60 tablet   0   . sucralfate (CARAFATE) 1 g tablet   Oral   Take 1 g by mouth 4 (four) times daily.         Marland Kitchen terconazole (TERAZOL 7) 0.4 % vaginal cream   Vaginal   Place 1 applicator vaginally at bedtime. x7day   45 g   0   . tiotropium (SPIRIVA) 18 MCG inhalation capsule   Inhalation   Place 18 mcg into inhaler and inhale daily.          . cefdinir (OMNICEF) 300 MG capsule   Oral   Take 300 mg by mouth daily.         . ertapenem 1 g in sodium chloride 0.9 % 50 mL   Intravenous   Inject 1 g into the vein daily.   1 g   42   . hydrOXYzine (VISTARIL) 50 MG capsule   Oral   Take 50 mg by mouth 4 (four) times daily as needed for anxiety.         . lipase/protease/amylase (CREON) 12000 units CPEP capsule   Oral   Take 24,000 Units by mouth 3 (three) times daily with meals.         Marland Kitchen omeprazole (PRILOSEC) 40 MG capsule   Oral   Take 40 mg by mouth daily.         . Oxycodone HCl 20 MG TABS   Oral   Take 1 tablet (20 mg total) by mouth every 12 (twelve) hours as needed. Patient taking differently: Take 1 tablet by mouth 3 (three) times daily as needed (for pain).    120 tablet   0   .  triamcinolone (NASACORT ALLERGY 24HR) 55 MCG/ACT AERO nasal inhaler   Nasal   Place 2 sprays into the nose daily as needed (for rhinitis).         . vancomycin 1,250 mg in sodium chloride 0.9 % 250 mL   Intravenous   Inject 1,250 mg into the vein every 12 (twelve) hours.   1250 mg   42   . zolpidem (AMBIEN) 10 MG tablet   Oral   Take 1 tablet (10 mg total) by mouth at bedtime as needed for sleep.   30 tablet   0     Allergies Ibuprofen; Sulfa antibiotics; Amoxicillin; Chocolate; Ciprofloxacin; Dilaudid; Fentanyl; Hydromorphone; Metformin; Penicillins; Strawberry extract; and Zofran  Family History  Problem Relation Age of Onset  . Hypertension Mother   . Heart disease Father   . Diabetes Father   . Cancer Sister     brain  . Cancer Grandchild 8    brain tumor    Social History Social History  Substance Use Topics  . Smoking status: Light Tobacco Smoker -- 0.50 packs/day    Types: Cigarettes  . Smokeless tobacco: Never Used     Comment: cutting back  . Alcohol Use: 2.4 oz/week    4 Cans of beer, 0 Standard drinks or equivalent per week  Review of Systems Constitutional: No fever/chills Eyes: No visual changes. ENT: No sore throat. Cardiovascular: Denies chest pain. Respiratory: Denies shortness of breath. Gastrointestinal: No abdominal pain.  No nausea, no vomiting.  No diarrhea.  No constipation. Genitourinary: Negative for dysuria. Musculoskeletal: Negative for back pain. Skin: Negative for rash. Neurological: Negative for headaches, focal weakness or numbness.  10-point ROS otherwise negative.  ____________________________________________   PHYSICAL EXAM:  VITAL SIGNS: ED Triage Vitals  Enc Vitals Group     BP 01/18/16 1157 101/72 mmHg     Pulse Rate 01/18/16 1157 100     Resp 01/18/16 1157 18     Temp 01/18/16 1157 98.4 F (36.9 C)     Temp Source 01/18/16 1157 Oral     SpO2 01/18/16 1157 97 %     Weight 01/18/16 1157 250 lb (113.399 kg)       Height 01/18/16 1157 _0  (1.727 m)     Head Cir --      Peak Flow --      Pain Score 01/18/16 1204 9     Pain Loc --      Pain Edu? --      Excl. in [place]? --      Constitutional: Alert and oriented. Chronically ill- appearing and in no acute distress. Eyes: Conjunctivae are normal. PERRL. EOMI. Head: Right scalp wound closed with suture, no purulence, drainage, erythema. Nose: No congestion/rhinnorhea. Mouth/Throat: Mucous membranes are moist. Oropharynx non-erythematous. Neck: No stridor. Supple without meningismus. Cardiovascular: Tachycardic rate, regular rhythm. Grossly normal heart sounds. Good peripheral circulation. Respiratory: Normal respiratory effort. No retractions. Lungs CTAB. Mild tachypnea. Gastrointestinal: Soft and nontender. No distention. No CVA tenderness. Genitourinary: deferred Musculoskeletal: No lower extremity tenderness nor edema. No joint effusions. PICC line in the right arm. Neurologic: Normal speech and language. No gross focal neurologic deficits are appreciated. 5 out of 5 strength in bilateral upper and lower extremities, sensation intact to light touch throughout. Skin: Skin is warm, dry and intact. No rash noted. Psychiatric: Mood and affect are normal. Speech and behavior are normal. ____________________________________________   LABS (all labs ordered are listed, but only abnormal results are displayed)  Labs Reviewed - No data to display ____________________________________________  EKG  none ____________________________________________  RADIOLOGY  none ____________________________________________   PROCEDURES  Procedure(s) performed: None  Procedures  Critical Care performed: No  ____________________________________________   INITIAL IMPRESSION / ASSESSMENT AND PLAN / ED COURSE  Pertinent labs & imaging results that were available during my care of the patient were reviewed by me and considered in my medical  decision making (see chart for details).  ZOIEE WIMMER is a 60 y.o. female with multiple co-morbidities, discharged from [facility] on 01/05/16 after having complications related to right panhemispheric subdural hematoma evacuated on 2/18 with bone flap removed and temporarily imbedded in abdomen and osteomyelitis of the skull followed by Dr. Cyndy Freeze of neurosurgery who removed the bone flap and closed the scalp primarily, on 6 weeks of IV vancomycin and ertapenem by PICC line who presents for evaluation for nonfunctioning PICC line. On exam, she is chronically ill-appearing but in no acute distress. Her vital signs are stable, she is afebrile. Blood cultures drawn yesterday showed no growth, the remainder of her labs were reassuring and I do not think she requires repeat today. I have consult at the [facility] team for replacement of the PICC line.  ----------------------------------------- 3:19 PM on 01/18/2016 ----------------------------------------- PICC team arrived and evaluated the PICC line and reports that  it functions well. It seems as if the issue is operator error, they educated the patient as well as the person who is helping to give her for IV antibiotics at home. I reviewed her chart and previously she refused SNF placement for IV antibiotics, she wants to give them to herself at home. I offered to give her a dose of ertapenem and vanc here however she has refused stating that she needs any leave immediately because her family member is here to pick her up. We'll DC home with return precautions and close neurosurgery follow-up. ____________________________________________   FINAL CLINICAL IMPRESSION(S) / ED DIAGNOSES  Final diagnoses:  Occluded PICC line, initial encounter [facility])      NEW MEDICATIONS STARTED DURING THIS VISIT:  Discharge Medication List as of 01/18/2016  3:22 PM       Note:  This document was prepared using Dragon voice recognition software and may include unintentional  dictation errors.    Joanne Gavel, MD 01/18/16 2050

## 2016-01-18 NOTE — ED Notes (Signed)
Pharmacy tech at bedside 

## 2016-01-18 NOTE — ED Notes (Signed)
Pt has PICC line to RUA and states she is unable to flush line this AM. Pt states she takes antibiotics twice a day and her line was working last night.

## 2016-01-20 ENCOUNTER — Emergency Department (HOSPITAL_COMMUNITY)
Admission: EM | Admit: 2016-01-20 | Discharge: 2016-01-20 | Discharge status: Against Medical Advice | Payer: Medicare Other | Attending: Emergency Medicine | Admitting: Emergency Medicine

## 2016-01-20 ENCOUNTER — Encounter (HOSPITAL_COMMUNITY): Payer: Self-pay | Admitting: *Deleted

## 2016-01-20 ENCOUNTER — Emergency Department (HOSPITAL_COMMUNITY): Payer: Medicare Other

## 2016-01-20 DIAGNOSIS — F17210 Nicotine dependence, cigarettes, uncomplicated: Secondary | ICD-10-CM | POA: Insufficient documentation

## 2016-01-20 DIAGNOSIS — I129 Hypertensive chronic kidney disease with stage 1 through stage 4 chronic kidney disease, or unspecified chronic kidney disease: Secondary | ICD-10-CM | POA: Diagnosis not present

## 2016-01-20 DIAGNOSIS — J449 Chronic obstructive pulmonary disease, unspecified: Secondary | ICD-10-CM | POA: Insufficient documentation

## 2016-01-20 DIAGNOSIS — T82898D Other specified complication of vascular prosthetic devices, implants and grafts, subsequent encounter: Secondary | ICD-10-CM | POA: Diagnosis not present

## 2016-01-20 DIAGNOSIS — Z452 Encounter for adjustment and management of vascular access device: Secondary | ICD-10-CM | POA: Diagnosis present

## 2016-01-20 DIAGNOSIS — Y828 Other medical devices associated with adverse incidents: Secondary | ICD-10-CM | POA: Diagnosis not present

## 2016-01-20 DIAGNOSIS — Z8673 Personal history of transient ischemic attack (TIA), and cerebral infarction without residual deficits: Secondary | ICD-10-CM | POA: Diagnosis not present

## 2016-01-20 DIAGNOSIS — Z85841 Personal history of malignant neoplasm of brain: Secondary | ICD-10-CM | POA: Diagnosis not present

## 2016-01-20 DIAGNOSIS — E1122 Type 2 diabetes mellitus with diabetic chronic kidney disease: Secondary | ICD-10-CM | POA: Diagnosis not present

## 2016-01-20 DIAGNOSIS — N189 Chronic kidney disease, unspecified: Secondary | ICD-10-CM | POA: Insufficient documentation

## 2016-01-20 MED ORDER — VANCOMYCIN HCL 10 G IV SOLR
1250.0000 mg | Freq: Once | INTRAVENOUS | Status: DC
  Filled 2016-01-20: qty 1250

## 2016-01-20 MED ORDER — ERTAPENEM SODIUM 1 G IJ SOLR
1.0000 g | Freq: Once | INTRAMUSCULAR | Status: AC
  Administered 2016-01-20: 1 g via INTRAVENOUS
  Filled 2016-01-20: qty 1

## 2016-01-20 MED ORDER — ALTEPLASE 2 MG IJ SOLR
2.0000 mg | Freq: Once | INTRAMUSCULAR | Status: AC
  Administered 2016-01-20: 2 mg
  Filled 2016-01-20: qty 2

## 2016-01-20 MED ORDER — SODIUM CHLORIDE 0.9% FLUSH
10.0000 mL | INTRAVENOUS | Status: DC | PRN
  Administered 2016-01-20: 10 mL

## 2016-01-20 NOTE — ED Notes (Signed)
PICC line does not flush nor give blood return.  Patient also states she wants her sutures out on her head incision and something for a yeast infection.  PA made aware

## 2016-01-20 NOTE — ED Notes (Signed)
Pt reports brain surgery x 4 since march. Has picc line in place to treat an infection and reports its been clogged x 2 days and unable to receive vancomycin.

## 2016-01-20 NOTE — ED Provider Notes (Signed)
Medical screening examination/treatment/procedure(s) were conducted as a shared visit with non-physician practitioner(s) and myself.  I personally evaluated the patient during the encounter.   EKG Interpretation None     59yF presenting because of issues with RUE PICC. Getting vanc/ertapenem for skull osteomyelitis. She did get dose of invanz while in ED. Refusing vanc. Refusing to stay to keep PICC TPA in and trouble shoot line. I spoke with her myself and she is adamant about leaving as soon as she can. I feel she is being unreasonable and I question compliance at home as well. She understands our concerns though and has medical decision making capability. She will be discharged against medical advice.   Virgel Manifold, MD 01/20/16 [phone]

## 2016-01-20 NOTE — ED Notes (Signed)
Pt verbalized understanding of leaving AMA and has no further questions. Pt educated on the importance of getting her IV antibiotics tonight. Pt stable, escorted to car by this RN via wheelchair, driven home by family.

## 2016-01-20 NOTE — ED Notes (Signed)
IV team RN in room with patient to inject TPA into PICC line. This RN in room. Pt stated to this RN "I'm ready to go home, pull this shit out of my left hand and let this bed down and let me go. 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 The pt stated "I am ready to go home, pull this shit out of my arm, pull the TPA out and let me go" MD ordered to pull the TPA out. This RN pulled back on PICC line and blood expelled from PICC line 64ml. This RN flushed PICC line and PICC line is now working properly. Pt was advised by PA, MD, IV team RN and this RN on risks of leaving and agreed to leave AMA.

## 2016-01-20 NOTE — ED Provider Notes (Signed)
CSN: [id number]     Arrival date & time 01/20/16  1417 History   First MD Initiated Contact with Patient 01/20/16 1530     Chief Complaint  Patient presents with  . Vascular Access Problem     (Consider location/radiation/quality/duration/timing/severity/associated sxs/prior Treatment) HPI Comments: [name] is a 60 y.o. 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 In June 2017, patient found to have osteomyelitis of skull following cranioplasty after subdural hematoma evacuation. Cultures grew Klebsiella. A PICC line was placed for continued administration of IV ABX for 6 weeks. She has been to the ED three times in the last week with similar issues. A home health nurse, home health aid, and herself take part in administering her ABX daily. Pt states PICC line has not been flushing and that she has not received her ABX in two days. She is tearful and concerned because she does not want to get sick again. She has generalized fatigue. Denies fever, chills, night sweats, chest pain, or urinary complaints.  She does endorse chronic SOB secondary to COPD and managed with inhalers, unchanged. She has also had diarrhea over the last week, no blood in stool.   The history is provided by the patient and medical records.    Past Medical History  Diagnosis Date  . Emphysema of lung ([place])   . Depression   . Diverticulitis   . Chronic bronchitis ([place])   . Malignant brain tumor ([place]) 2007  . GERD (gastroesophageal reflux disease)   . Allergy   . Hepatitis C   . Hypertension   . Chronic kidney disease   . Migraines   . Urine incontinence   . Seizures ([place])   . Stroke [facility]) 2007    during brain surgery  . Pancreatitis, alcoholic 1517  . Rectovaginal fistula   . H/O ETOH abuse     Sober since 2009  . H/O drug abuse     Clean since 2009  . Pancreatic  ascites   . Cirrhosis ([place])   . Diabetes mellitus without complication [facility])    Past Surgical History  Procedure Laterality Date  . Appendectomy  1999  . Eye surgery    . Rectovaginal fistula repair w/ colostomy  2011  . Brain surgery  2007    malignant brain tumor  . Cholecystectomy  2001  . Abdominal hysterectomy  1979  . Craniotomy Right 08/16/2015    Procedure: Right Fronto-Temporal-Parietal Craniotomy for Evacuation of Hematoma with Bone Flap Placement in Abdomen;  Surgeon: Ashok Pall, MD;  Location: [place] NEURO ORS;  Service: Neurosurgery;  Laterality: Right;  . Cranioplasty N/A 09/15/2015    Procedure: Cranioplasty with retrieval of bone flap from abdominal pocket;  Surgeon: Ashok Pall, MD;  Location: [place] NEURO ORS;  Service: Neurosurgery;  Laterality: N/A;  Cranioplasty with retrieval of bone flap from abdominal pocket  . Craniotomy N/A 12/28/2015    Procedure: Craniectomy for wound debridement;  Surgeon: Ashok Pall, MD;  Location: [place] NEURO ORS;  Service: Neurosurgery;  Laterality: N/A;  Craniectomy for wound debridement   Family History  Problem Relation Age of Onset  . Hypertension Mother   . Heart disease Father   . Diabetes Father   . Cancer Sister     brain  . Cancer Grandchild 8    brain tumor   Social History  Substance Use Topics  .  Smoking status: Light Tobacco Smoker -- 0.50 packs/day    Types: Cigarettes  . Smokeless tobacco: Never Used     Comment: cutting back  . Alcohol Use: 2.4 oz/week    4 Cans of beer, 0 Standard drinks or equivalent per week   OB History    No data available     Review of Systems  Respiratory: Positive for shortness of breath ( chronic and unchanged).   Gastrointestinal: Positive for diarrhea ( x1 week, no blood).  Skin:       PICC line not flushing  Neurological: Positive for weakness ( left side, chronic) and numbness ( left side, chronic).  All other systems reviewed and are negative.     Allergies  Ibuprofen; Sulfa  antibiotics; Hydroxyzine; Amoxicillin; Chocolate; Ciprofloxacin; Dilaudid; Fentanyl; Hydromorphone; Metformin; Penicillins; Strawberry extract; and Zofran  Home Medications   Prior to Admission medications   Medication Sig Start Date End Date Taking? Authorizing Provider  albuterol (PROVENTIL HFA;VENTOLIN HFA) 108 (90 Base) MCG/ACT inhaler Inhale 2 puffs into the lungs every 6 (six) hours as needed for wheezing or shortness of breath.   Yes Historical Provider, MD  albuterol (PROVENTIL) (5 MG/ML) 0.5% nebulizer solution Take 2.5 mg by nebulization every 6 (six) hours as needed for wheezing or shortness of breath.    Yes Historical Provider, MD  ALPRAZolam Duanne Moron) 1 MG tablet Take 1 tablet (1 mg total) by mouth 2 (two) times daily as needed for anxiety. 01/05/16  Yes Ashok Pall, MD  amitriptyline (ELAVIL) 50 MG tablet Take 1 tablet (50 mg total) by mouth at bedtime. 12/01/12  Yes Jackolyn Confer, MD  amLODipine (NORVASC) 10 MG tablet Take 10 mg by mouth daily.   Yes Historical Provider, MD  diphenhydrAMINE (BENADRYL) 25 mg capsule Take 1 capsule (25 mg total) by mouth every 8 (eight) hours as needed for itching. 10/25/15  Yes Vaughan Basta, MD  diphenoxylate-atropine (LOMOTIL) 2.5-0.025 MG tablet Take 1 tablet by mouth 3 (three) times daily. 12/25/15  Yes Historical Provider, MD  ertapenem 1 g in sodium chloride 0.9 % 50 mL Inject 1 g into the vein daily. 01/05/16 02/19/16 Yes Ashok Pall, MD  Fluticasone-Salmeterol (ADVAIR) 250-50 MCG/DOSE AEPB Inhale 2 puffs into the lungs every 6 (six) hours as needed (for wheezing or shortness of breath).    Yes Historical Provider, MD  furosemide (LASIX) 20 MG tablet Take 20 mg by mouth daily.   Yes Historical Provider, MD  gabapentin (NEURONTIN) 300 MG capsule Take 600 mg by mouth at bedtime.    Yes Historical Provider, MD  Ipratropium-Albuterol (COMBIVENT) 20-100 MCG/ACT AERS respimat Inhale 2 puffs into the lungs every 6 (six) hours as needed for  wheezing or shortness of breath.    Yes Historical Provider, MD  levETIRAcetam (KEPPRA) 750 MG tablet Take 750 mg by mouth 2 (two) times daily.   Yes Historical Provider, MD  lipase/protease/amylase (CREON) 12000 units CPEP capsule Take 24,000 Units by mouth 3 (three) times daily with meals.   Yes Historical Provider, MD  omeprazole (PRILOSEC) 40 MG capsule Take 40 mg by mouth daily. Reported on 01/20/2016   Yes Historical Provider, MD  Oxycodone HCl 20 MG TABS Take 1 tablet (20 mg total) by mouth every 12 (twelve) hours as needed. Patient taking differently: Take 1 tablet by mouth 3 (three) times daily as needed (for pain).  11/16/15  Yes Rubie Maid, MD  phenytoin (DILANTIN) 100 MG/4ML suspension Take 300 mg by mouth 3 (three) times daily.   Yes  Historical Provider, MD  pravastatin (PRAVACHOL) 10 MG tablet Take 10 mg by mouth daily.    Yes Historical Provider, MD  promethazine (PHENERGAN) 25 MG tablet Take 25 mg by mouth every 8 (eight) hours as needed for nausea or vomiting.   Yes Historical Provider, MD  spironolactone (ALDACTONE) 50 MG tablet Take 1 tablet (50 mg total) by mouth 2 (two) times daily. 10/25/15  Yes Vaughan Basta, MD  terconazole (TERAZOL 7) 0.4 % vaginal cream Place 1 applicator vaginally at bedtime. x7day 01/10/16  Yes Rubie Maid, MD  tiotropium (SPIRIVA) 18 MCG inhalation capsule Place 18 mcg into inhaler and inhale daily as needed (shortness of breath).    Yes Historical Provider, MD  triamcinolone (NASACORT ALLERGY 24HR) 55 MCG/ACT AERO nasal inhaler Place 2 sprays into the nose daily as needed (for rhinitis).   Yes Historical Provider, MD  vancomycin 1,250 mg in sodium chloride 0.9 % 250 mL Inject 1,250 mg into the vein every 12 (twelve) hours. 01/05/16 02/19/16 Yes Ashok Pall, MD  zolpidem (AMBIEN) 10 MG tablet Take 1 tablet (10 mg total) by mouth at bedtime as needed for sleep. 01/05/16 02/04/16 Yes Ashok Pall, MD   BP 112/67 mmHg  Pulse 97  Temp(Src) 98 F (36.7  C) (Oral)  Resp 16  SpO2 96% Physical Exam  Constitutional: She appears ill ( chronically). No distress.  Appears older than stated age.   HENT:  Head: Atraumatic.  Mouth/Throat: Oropharynx is clear and moist. No oropharyngeal exudate.  Asymmetric skull.   Eyes: Conjunctivae and EOM are normal. Pupils are equal, round, and reactive to light. Right eye exhibits no discharge. Left eye exhibits no discharge. No scleral icterus.  Neck: Normal range of motion. Neck supple.  Cardiovascular: Regular rhythm and intact distal pulses.  Tachycardia present.   Murmur heard. Systolic ejection murmur.   Pulmonary/Chest: Effort normal and breath sounds normal. No respiratory distress. She has no wheezes. She has no rales.  Abdominal: Soft. Bowel sounds are normal. There is no tenderness. There is no rebound and no guarding.  Musculoskeletal: Normal range of motion. She exhibits no edema.  Lymphadenopathy:    She has no cervical adenopathy.  Neurological: She is alert.  Skin: Skin is warm and dry. She is not diaphoretic.     Well healing surgical incision on right scalp. No redness, swelling, or discharge noted.   Psychiatric: She has a normal mood and affect.    ED Course  Procedures (including critical care time) Labs Review Labs Reviewed - No data to display  Imaging Review Dg Chest 2 View  01/20/2016  CLINICAL DATA:  Malfunctioning PICC line. EXAM: CHEST  2 VIEW COMPARISON:  Radiographs of January 17, 2016. FINDINGS: The heart size and mediastinal contours are within normal limits. No pneumothorax or pleural effusion is noted. Left lung is clear. Minimal right basilar subsegmental atelectasis is noted. Right-sided PICC line is unchanged in position with distal tip in expected position of the SVC. The visualized skeletal structures are unremarkable. IMPRESSION: Minimal right basilar subsegmental atelectasis. Right-sided PICC line is unchanged in position with distal tip in expected position of  SVC. Electronically Signed   By: Marijo Conception, M.D.   On: 01/20/2016 16:36   I have personally reviewed and evaluated these images and lab results as part of my medical decision-making.   EKG Interpretation None     Filed Vitals:   01/20/16 1620 01/20/16 1621 01/20/16 1630 01/20/16 1847  BP: 102/64 102/64 112/91 112/67  Pulse:  102 100 97  Temp:    98 F (36.7 C)  TempSrc:    Oral  Resp:  16  16  SpO2:  97% 99% 96%    MDM   Final diagnoses:  Occluded PICC line, subsequent encounter    Patient is afebrile and chronically ill-appearing in NAD. She appears older than stated age. On initial presentation pt mildly tachycardic, vital signs otherwise stable. Physical exam remarkable for right upper extremity PICC line placement and well healing surgical incision on right scalp. Per nursing, PICC line does not flush nor give blood in return. Labs were drawn two days ago at [facility] ED - CBC unremarkable, CMP shows elevation of AST and alk phos but consistent compared to previous, lactic acid normal, and blood and urine cultures showed no growth. CT head completed on 01/17/16 shows no acute intracranial abnormalities. At this time do not feel repeat labs are warranted. CXR shows unchanged position of PICC line. Will consult IV team for evaluation of PICC line. Will administer dose of IV ABX while in ED.   In discussing plan with pt, she is asking how long this process will take as she does not have transportation and her ride is late for work. Discussed with patient the ability to get her a cab voucher, at the time she seems amenable. Transportation appears to be a considerable barrier to her care. During her previous ED visit she declined ABX administration because of transportation issues. She also declined SNF placement for ABX administration in previous notes. Consult to case management for further assistance and ensuring pt receives f/u care.   Pt received dose of ertapenam in ED; however, pt  refusing vancomycin. She is requesting to be discharged.  6:52 PM: IV team RN in room and tPA injected into PICC line. Pt made aware that, per procedure, tPA remains in PICC line for two hours before being withdrawn. Pt refuses to stay and is requesting to leave. 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 Discussed these concerns with patient and she continues to be adamant regarding leaving. Discussed patient with Dr. Wilson Singer who also saw patient and expressed concerns.   Pt made aware of risks of inadequate ABX treatment to include risk of infection, worsening of condition, and possibility of death. She was made aware that she is leaving against medical advice. She voiced understanding and still requests to leave.   7:10PM: I was notified by Norva Pavlov, RN that blood expelled from PICC line, PICC line was flushed, and now functioning. Re-iterated to patient the importance of taking IV ABX as prescribed. Stressed importance of following up with Dr. Cyndy Freeze on Monday. Return precautions discussed. Pt voiced understanding and is agreeable.   Roxanna Mew, [place] 01/21/16 0013  Virgel Manifold, MD 02/05/16 [phone]

## 2016-01-20 NOTE — Discharge Instructions (Signed)
Read the information below.   By leaving against medical advice you are at an increase risk of infection, worsening of your condition, and possibly death if you are unable to receive the antibiotics as prescribed. You voiced understanding of the risks and have elected to leave against medical advice.  It is extremely important that you follow up with Dr. Christella Noa first thing on Monday morning regarding your picc line and antibiotics.  You may return to the Emergency Department at any time for worsening condition or any new symptoms that concern you. Please return to ED if your symptoms worsen or you develop new symptoms, fever, chest pain, shortness of breath, weakness, or loss of consciousness.

## 2016-01-20 NOTE — ED Notes (Signed)
Pt returned from xray, placed back on monitor.

## 2016-01-24 ENCOUNTER — Telehealth: Payer: Self-pay | Admitting: Obstetrics and Gynecology

## 2016-01-24 NOTE — Progress Notes (Signed)
01/23/2016 1717pm  EDCM received consult in CM inbox stating patient has been to the ED three times due to issues with her PICC line, clogged.  EDCM called and spoke to [facility] IV infusion RN for [facility] to inform her of picc line issues.

## 2016-01-24 NOTE — Telephone Encounter (Signed)
Zoei CALLED CRYING AND I COULDN'T HALF UNDERSTAND HER. SHE SAID SHE'S BEEN GOING THRU HELL AND SHE NEEDS TO TALK TO YOU.

## 2016-01-25 ENCOUNTER — Telehealth: Payer: Self-pay | Admitting: Obstetrics and Gynecology

## 2016-01-25 NOTE — Telephone Encounter (Signed)
[name] is depressed and needs help, she said she don't [place] any money to det gas money to give someone to take her to the dr. She wants to ask you to help her. Im not kidding.

## 2016-01-26 ENCOUNTER — Telehealth: Payer: Self-pay

## 2016-01-26 ENCOUNTER — Telehealth: Payer: Self-pay | Admitting: *Deleted

## 2016-01-26 LAB — FUNGUS CULTURE WITH STAIN

## 2016-01-26 LAB — FUNGAL ORGANISM REFLEX

## 2016-01-26 LAB — FUNGUS CULTURE RESULT

## 2016-01-26 NOTE — Telephone Encounter (Signed)
Called pt she states that she is having transportation issues getting to and from her appointments. Pt asks if we can take up a donation to help. Advised pt that we cannot take up money for a pt as it would be unethical. Advised pt to call her medicaid case worker to see what resources might be available.

## 2016-01-26 NOTE — Telephone Encounter (Signed)
[facility] lab work from 01/25/16-increased sed rate and CRP post-hospiital.  Sed rate 3 weeks ago=73, now 112.  CRP 3 weeks ago=3.0, now 19.6.  Last day IV vanc and ertapenem 02/07/16.  [facility] appt 02/27/16.  Message routed to Dr. Baxter Flattery via [facility], [place] 01/26/16.

## 2016-01-26 NOTE — Telephone Encounter (Signed)
Labs received from [facility] dated 01-25-16. Sed rate elevated at 112 and C reactive protein 19.6 . Previous labs dated 01-22-16 resulted sed rate as 67 and creative protein 67.  Labs values called to [facility] , spoke with Jeani Hawking regarding results.  He stated Vancomycin trough on 01-17-16 was adjusted. They have not yet received the labs. He will look for labs and make adjustments if needed.    Labs placed in folder for future appointment with Dr Baxter Flattery on 02-27-16.   Laverle Patter, RN

## 2016-01-29 ENCOUNTER — Telehealth: Payer: Self-pay | Admitting: *Deleted

## 2016-01-29 ENCOUNTER — Other Ambulatory Visit: Payer: Self-pay | Admitting: *Deleted

## 2016-01-29 DIAGNOSIS — M869 Osteomyelitis, unspecified: Secondary | ICD-10-CM

## 2016-01-29 NOTE — Telephone Encounter (Signed)
Per verbal from Dr Baxter Flattery called the patient to let her know that her PICC needs to be changed and she will have to go to the hospital to do so. The patient advised she does not have a ride and requested it be done at [facility] if possible. Advised her not sure but will give them a call to see if it can be done there and I will call her back. She advised she will wait on my call.  Called [facility] and had to leave a message for the IV team to call me back. [place] [phone]

## 2016-01-29 NOTE — Telephone Encounter (Signed)
Placed call to the patient to let her know I am still working on the PICC appt and ask her when was the last time she received her IV medications. She advised yesterday she was able to get a dose and today she could not do anything with the PICC. Advised her found out they do have PICC placement at [facility] and had to leave a message for them to call me as soon as possible so we can get the line replaced. She was fine with that. I also gave her the option to go to the ED to have it replaced and she began to cry stating that she had been there and they took to long and told her she was there for pain medication and she had a very bad experience and would rather have an appointment to have it done as it takes less time. Advised her will call back once I get it scheduled.

## 2016-01-30 ENCOUNTER — Other Ambulatory Visit: Payer: Self-pay | Admitting: Obstetrics and Gynecology

## 2016-01-30 ENCOUNTER — Telehealth: Payer: Self-pay | Admitting: Obstetrics and Gynecology

## 2016-01-30 NOTE — Telephone Encounter (Signed)
pls advise

## 2016-01-30 NOTE — Telephone Encounter (Signed)
Spoke with [name] at [facility] and was able to get the patient an appt to have PICC replaced for 01/31/16 at 8 am arrive at 745. I faxed orders to [place] at [phone] for the patient and [facility] has been notified.  Spoke to the patient and gave her information. She advised she will be able to make the appt. She was very greatful it was able to be done near her home. Advised patient will call tomorrow and check on her so she needs to go and have it done.

## 2016-01-30 NOTE — Telephone Encounter (Signed)
[name] called for an appt today for rash on her  hoo ha. I put her 8/2 @11 :15. That was the first available at the time, but [name] brought to my attention that she needs a 30 min appt. Should I reschedule or how can I let her know she really doesn't need an appt. Knowing what we do.?

## 2016-01-30 NOTE — Telephone Encounter (Signed)
Can you have patient come in tomorrow for evaluation. May need repeat NCHCT as well

## 2016-01-31 ENCOUNTER — Encounter
Admission: RE | Disposition: A | Discharge status: Home / Self Care | Payer: Self-pay | Source: Ambulatory Visit | Attending: Vascular Surgery

## 2016-01-31 ENCOUNTER — Ambulatory Visit
Admission: RE | Admit: 2016-01-31 | Discharge: 2016-01-31 | Disposition: A | Discharge status: Home / Self Care | Payer: Medicare Other | Source: Ambulatory Visit | Attending: Vascular Surgery | Admitting: Vascular Surgery

## 2016-01-31 SURGERY — PICC LINE INSERTION
Anesthesia: Moderate Sedation

## 2016-01-31 NOTE — Clinical Social Work Note (Signed)
CSW contacted by the [facility] here at [facility] stating concern for patient as she was stating she did not have a ride home. CSW came to speak with patient. Patient  had presented her for a PICC line replacement but had to be postponed once she was here. Patient was crying and saying that she did not have a ride home and that her mother had brought her for her procedure and her mother was going on a fishing trip after and would not be able to pick her up. Patient stated to CSW that she has had multiple brain surgeries for cancer and has had a stroke, etc. Patient stated she was frustrated and that she had not taken her medications this morning. She stated that she has a husband but he was at work and she also had "someone" that comes in 5 days a week to help her. Patient states that she has to have help because she has memory issues and seizures. Patient stated she did not have a ride home and that her mother is hateful to her and tells her all the time she wished her daughter to die. Patient stated she didn't feel like anyone cares for her. CSW explained that we could not assist with a taxi voucher because she has seizures and that would not be safe. She called her mother while CSW was there and her mother told her she would be at the hospital to pick her up eventually. CSW asked for her to call her mother back and when she handed the phone to [place] and her mother answered, her mother yelled " I told you not to call me anymore!" CSW informed her that she was speaking with CSW and not her daughter. She apologized for her response and stated she would be at hospital to pick up her daughter in 30 minutes to an hour. CSW informed patient that CSW would be by check on her again and to ensure she was picked up by her mother. CSW came by to check on patient about an hour later and was informed by hospital staff in the waiting area that patient went home with a friend and not her mother. CSW made a DSS APS report due to concern over  neglect by her family and the fact that she is a vulnerable adult. Shela Leff mSW<LCSW [phone]

## 2016-01-31 NOTE — Telephone Encounter (Signed)
I contacted the patient yesterday over the phone.  I told her that if there was a cancellation then we could move her up, but otherwise 8/2 was the earliest available date .

## 2016-01-31 NOTE — Telephone Encounter (Signed)
Contacted patient, who states that she is going through "so much". Notes that she does not know how much longer she can take the pain and suffering.  Was recently released from the hospital 1-2 weeks ago where she had to have another brain operation.  States that she is also having severe episodes of stomach pain, may be due to the cancer.  Also notes that she is having to go back to have IV line replaced as her current one became clotted and is having hand swelling.  Patient very tearful.  Reports that she wonders "why God is punishing her". States that no one cares about her.  When asked if she has someone who visits her, she does report that her estranged husband does come to check on her.  Empathized with patient, also advised her to also contact her counselor regarding her worsening depressive symptomsor seek pastoral care in coping with all of her medical issues.  Currently denies SI/HI.   Patient also reports a rash in thigh and vaginal area that is irritating.  Has an appointment scheduled for 02/07/16, and wonders if she can be seen sooner.  Advised that this was the next available, but if any cancellations occur, we could try to move up her appointment.    Rubie Maid, MD [facility]

## 2016-02-01 ENCOUNTER — Ambulatory Visit: Admission: RE | Admit: 2016-02-01 | Payer: Medicare Other | Source: Ambulatory Visit | Admitting: Vascular Surgery

## 2016-02-02 ENCOUNTER — Ambulatory Visit
Admission: RE | Admit: 2016-02-02 | Discharge: 2016-02-02 | Disposition: A | Discharge status: Home / Self Care | Payer: Medicare Other | Source: Ambulatory Visit | Attending: Vascular Surgery | Admitting: Vascular Surgery

## 2016-02-02 ENCOUNTER — Encounter
Admission: RE | Disposition: A | Discharge status: Home / Self Care | Payer: Self-pay | Source: Ambulatory Visit | Attending: Vascular Surgery

## 2016-02-02 ENCOUNTER — Ambulatory Visit: Payer: Medicare Other

## 2016-02-02 ENCOUNTER — Encounter: Admission: RE | Payer: Self-pay | Source: Ambulatory Visit

## 2016-02-02 DIAGNOSIS — Z452 Encounter for adjustment and management of vascular access device: Secondary | ICD-10-CM | POA: Insufficient documentation

## 2016-02-02 DIAGNOSIS — Z95828 Presence of other vascular implants and grafts: Secondary | ICD-10-CM

## 2016-02-02 SURGERY — PICC LINE INSERTION

## 2016-02-02 SURGERY — PICC LINE INSERTION
Anesthesia: Moderate Sedation

## 2016-02-02 NOTE — Telephone Encounter (Signed)
Will do. She was finally able to get her PICC done.

## 2016-02-02 NOTE — Telephone Encounter (Signed)
Touch base with me on Monday. May need to have her see neurosurgery sooner than later. Will need to repeat head imaging

## 2016-02-06 IMAGING — DX DG FOOT COMPLETE 3+V*L*
3 series · 3 of 3 positions shown · non-contrast
Comparison: June 02, 2010.

CLINICAL DATA: Pain post trauma

EXAM:
LEFT FOOT - COMPLETE 3+ VIEW

[foot ap]
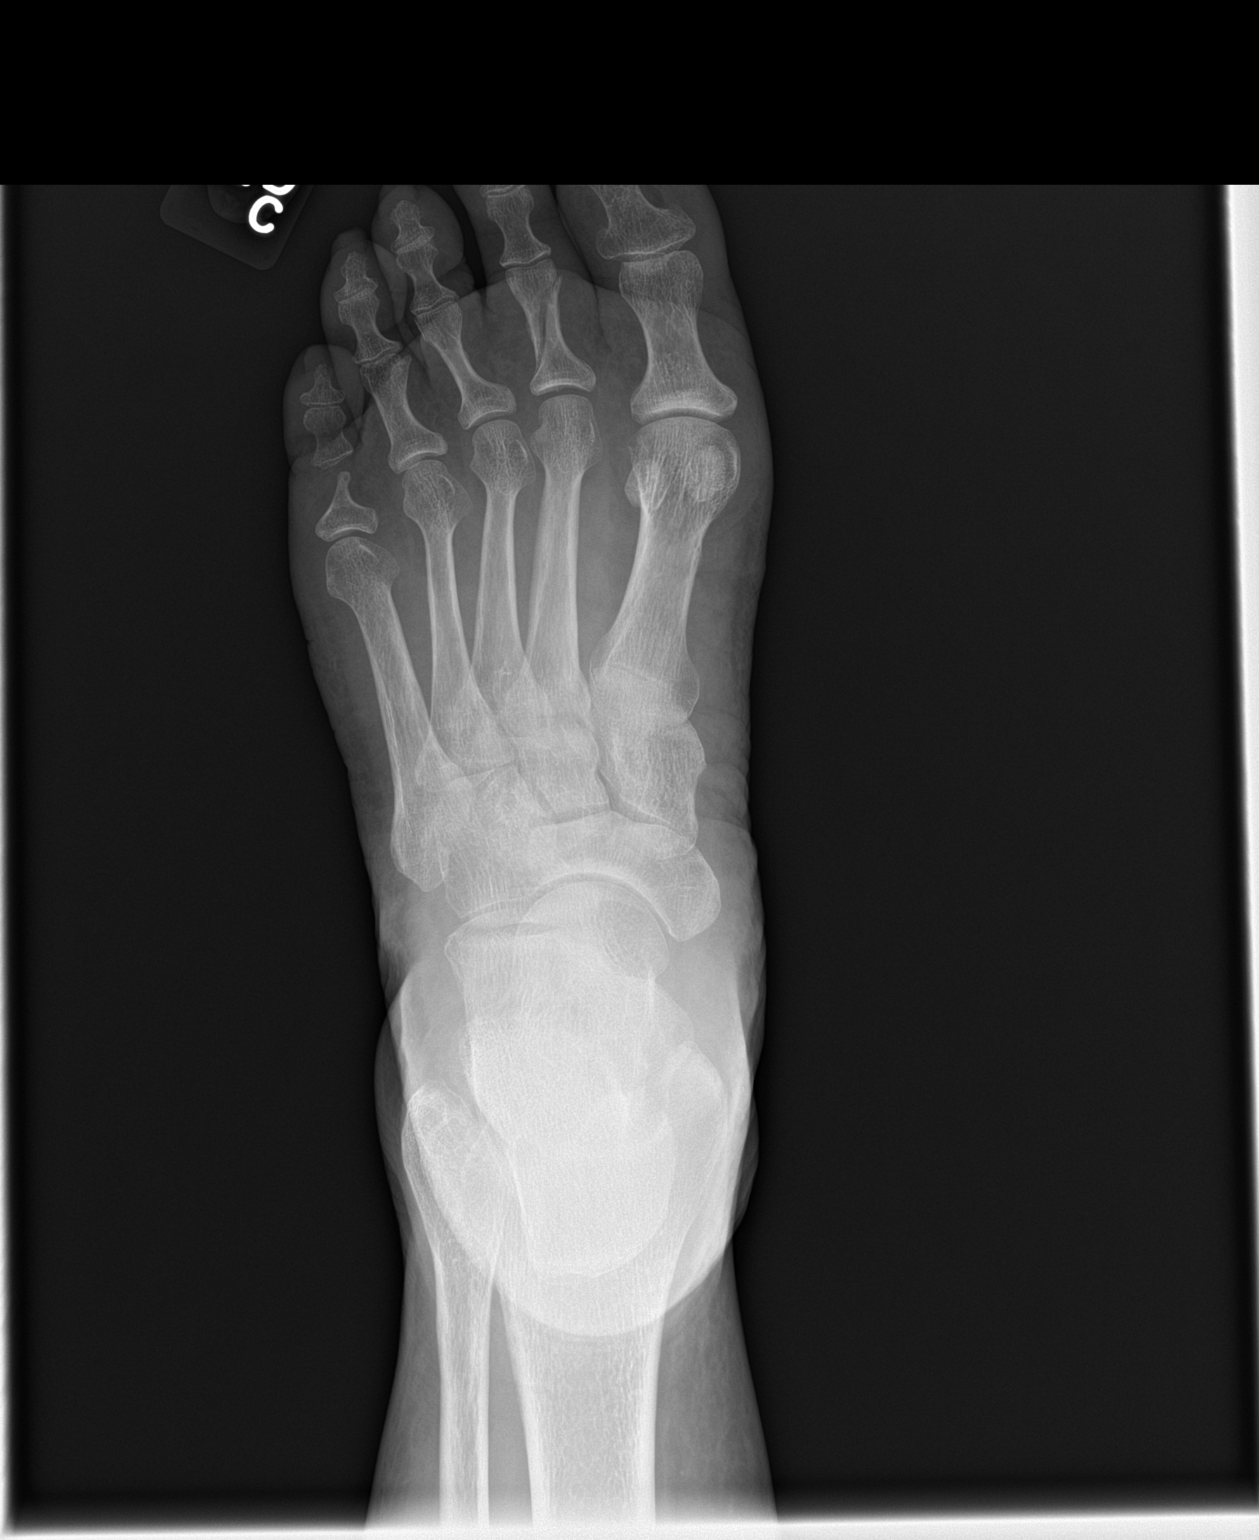

[foot obl]
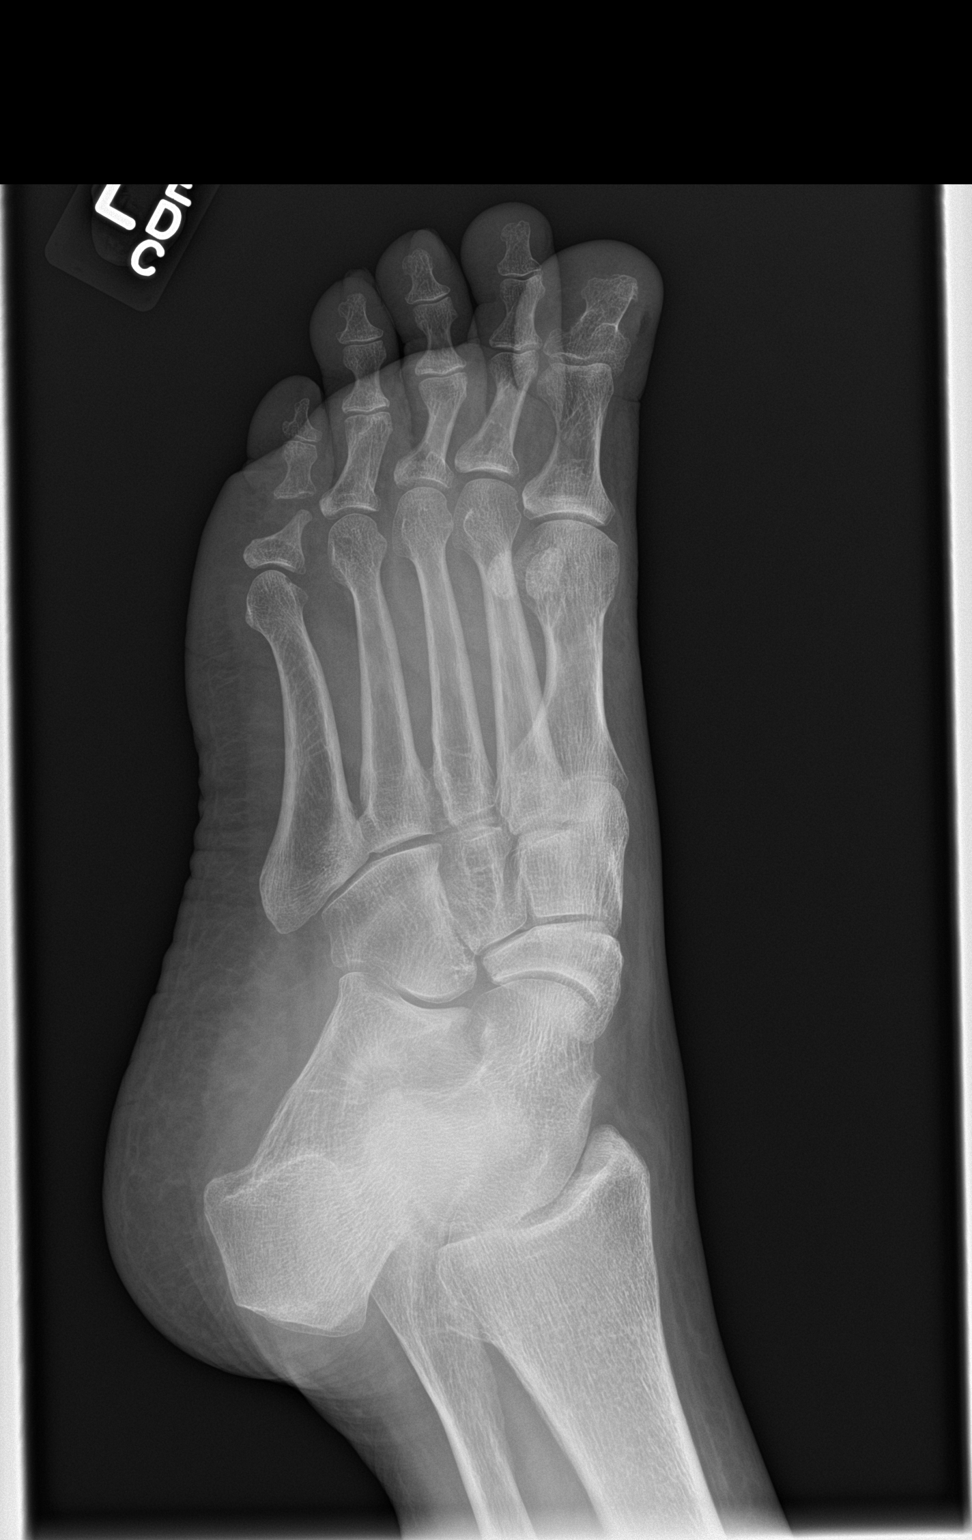

[foot lat]
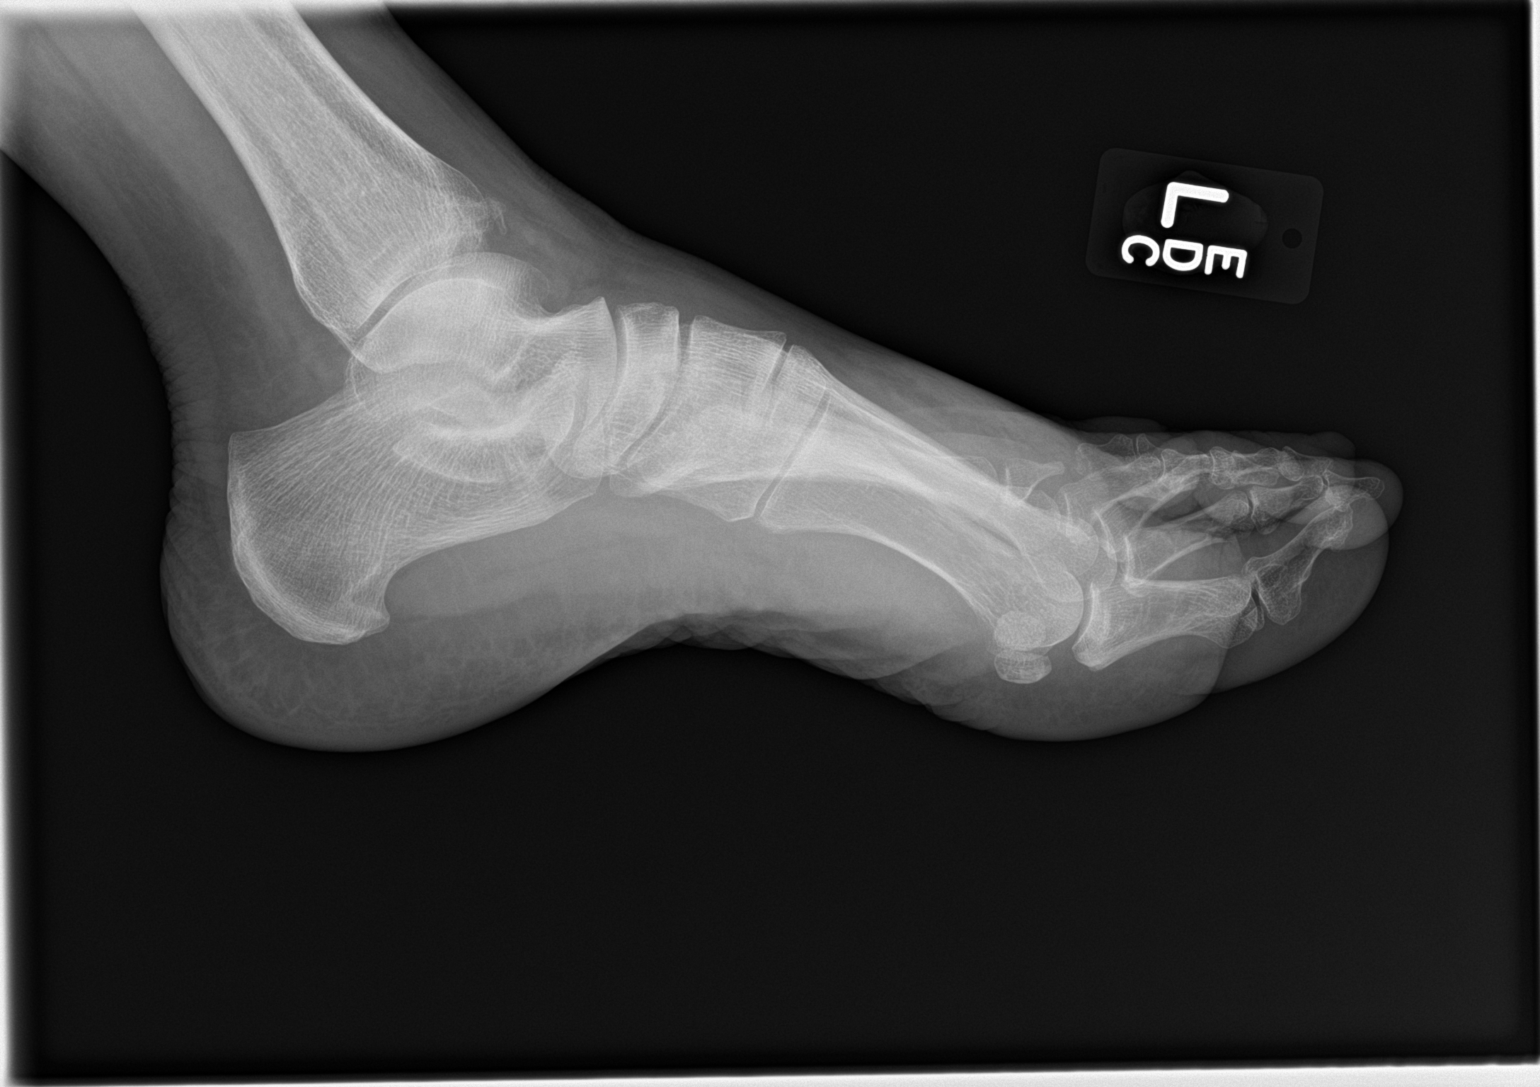

[3 of 3 positions shown; findings below may reference images not displayed]

FINDINGS: Frontal, oblique, and lateral views were obtained. There is an
obliquely oriented fracture of the second proximal phalanx with
fracture fragments in near anatomic alignment. No other acute
fracture. No dislocation. There is chronic remodeling of the fifth
proximal phalanx with marked narrowing and atrophy distally, a
stable finding. There is a calcification anterior to the distal
tibia, probably residua of old trauma. There is no appreciable joint
space narrowing. No erosive change.
IMPRESSION: Obliquely oriented fracture of the proximal second phalanx with
major fracture fragments in near anatomic alignment. Chronic
hypoplasia of the mid to distal aspect of the fifth proximal
phalanx. Calcification anterior to the distal tibia, probably
residua of old trauma. No dislocation.

## 2016-02-07 ENCOUNTER — Ambulatory Visit (INDEPENDENT_AMBULATORY_CARE_PROVIDER_SITE_OTHER): Payer: Medicare Other | Admitting: Obstetrics and Gynecology

## 2016-02-07 VITALS — BP 121/79 | HR 108 | Wt 253.1 lb

## 2016-02-07 DIAGNOSIS — F1010 Alcohol abuse, uncomplicated: Secondary | ICD-10-CM | POA: Diagnosis not present

## 2016-02-07 DIAGNOSIS — F329 Major depressive disorder, single episode, unspecified: Secondary | ICD-10-CM | POA: Diagnosis not present

## 2016-02-07 DIAGNOSIS — L309 Dermatitis, unspecified: Secondary | ICD-10-CM | POA: Diagnosis not present

## 2016-02-07 DIAGNOSIS — G479 Sleep disorder, unspecified: Secondary | ICD-10-CM

## 2016-02-07 DIAGNOSIS — F32A Depression, unspecified: Secondary | ICD-10-CM

## 2016-02-07 DIAGNOSIS — N898 Other specified noninflammatory disorders of vagina: Secondary | ICD-10-CM

## 2016-02-07 MED ORDER — TERCONAZOLE 0.4 % VA CREA: 1.0000 | TOPICAL_CREAM | Freq: Every day | VAGINAL | 0 refills | Status: DC

## 2016-02-07 MED ORDER — TRIAMCINOLONE ACETONIDE 0.1 % EX CREA: 1.0000 "application " | TOPICAL_CREAM | Freq: Two times a day (BID) | CUTANEOUS | 1 refills | Status: DC

## 2016-02-07 NOTE — Telephone Encounter (Signed)
Pt's PICC line developed problems.  The PICC was replaced on 02/02/16.  Phone call to [place] to receive the most current labs.  Labs drawn on 02/05/16.  Labs faxed to [facility] and placed for review by pharmacists.

## 2016-02-11 NOTE — Progress Notes (Signed)
    GYNECOLOGY PROGRESS NOTE  Subjective:    Patient ID: [name], female    DOB: 03-10-1956, 60 y.o.   MRN: [id number]  HPI  Patient is a 60 y.o. female who presents for complaints of vaginal itching, black vaginal discharge, rash on thighs, and possible boil.  Symptoms have been present for ~ 2 weeks. Notes that she douches several times a day with water to keep vaginal area clean.   In addition, patient notes that she is very depressed.  She is tired of having to be hospitalized due to multiple medical comorbidities.  Sometimes feels like giving up.  States that she feels no one really cares about her. Has solace only in that she recently had a new grandchild born ~ 1 week ago.  Is also having difficulty sleeping as she has run out of her sleeping meds.  Has now resumed drinking ~ a case of beer at bedtime.   The following portions of the patient's history were reviewed and updated as appropriate: allergies, current medications, past family history, past medical history, past social history, past surgical history and problem list.  Review of Systems A comprehensive review of systems was negative except for: depression, chronic pain at multiple sites, and what's noted in HPI   Objective:   Blood pressure 121/79, pulse (!) 108, weight 253 lb 1.6 oz (114.8 kg). General appearance: alert and no distress Pelvic: external genitalia appears normal, no lesions, sparse pubic hair.  Internal exam with scant thin white discharge, no odor.  Bimanual exam deferred. Mild eczematoid rash noted on inner thighs.  Extremities: extremities normal, atraumatic, no cyanosis or edema  Assessment:   Vaginal discharge Eczema rash Depression Alcohol abuse  Plan:   - Vaginal discharge appears physiologic, however patient notes h/o recurrent yeast infections.  Desires medical treatment.  Prescribed Fluconazole cream for as needed.  - Will prescribe hydrocortisone cream for rash.  - Depression -  discussion had with patient several times regarding seeking counseling for depression symptoms.  Desires referral for psychiatrist (notes that she does not care for her current one).  Will refer.  - Alcohol abuse - strongly encouraged cessation in light of multiple medical problems, including h/o liver cirrhosis.  Discussed alternative sleep medications OTC that may help (will no longer prescribe patient's ambien as she has been on this for many years and had developed dependence in the past).  - To f/u as needed. To assess for need for annual exam at next visit.   Rubie Maid, MD [facility]

## 2016-02-21 ENCOUNTER — Telehealth: Payer: Self-pay | Admitting: Pharmacist Clinician (PhC)/ Clinical Pharmacy Specialist

## 2016-02-21 LAB — CULTURE, BLOOD (ROUTINE X 2)
CULTURE: NO GROWTH
Culture: NO GROWTH

## 2016-02-21 NOTE — Telephone Encounter (Signed)
[facility] called to see if the PICC can be pulled since she is done with ABX. There is concern for residual infection due to her recent ESR. Dr. Baxter Flattery stated to keep PICC for now and plan to repeat CT.

## 2016-02-22 ENCOUNTER — Other Ambulatory Visit: Payer: Self-pay | Admitting: Internal Medicine

## 2016-02-22 DIAGNOSIS — G060 Intracranial abscess and granuloma: Secondary | ICD-10-CM

## 2016-02-23 ENCOUNTER — Emergency Department
Admission: EM | Admit: 2016-02-23 | Discharge: 2016-02-23 | Disposition: A | Discharge status: Home / Self Care | Payer: Medicare Other | Source: Home / Self Care

## 2016-02-23 ENCOUNTER — Telehealth: Payer: Self-pay

## 2016-02-23 ENCOUNTER — Telehealth: Payer: Self-pay | Admitting: *Deleted

## 2016-02-23 ENCOUNTER — Encounter: Payer: Self-pay | Admitting: Emergency Medicine

## 2016-02-23 ENCOUNTER — Emergency Department: Payer: Medicare Other

## 2016-02-23 DIAGNOSIS — R112 Nausea with vomiting, unspecified: Secondary | ICD-10-CM

## 2016-02-23 DIAGNOSIS — Z5321 Procedure and treatment not carried out due to patient leaving prior to being seen by health care provider: Secondary | ICD-10-CM

## 2016-02-23 DIAGNOSIS — G0490 Encephalitis and encephalomyelitis, unspecified: Secondary | ICD-10-CM | POA: Insufficient documentation

## 2016-02-23 DIAGNOSIS — E1122 Type 2 diabetes mellitus with diabetic chronic kidney disease: Secondary | ICD-10-CM

## 2016-02-23 DIAGNOSIS — R51 Headache: Secondary | ICD-10-CM

## 2016-02-23 DIAGNOSIS — I129 Hypertensive chronic kidney disease with stage 1 through stage 4 chronic kidney disease, or unspecified chronic kidney disease: Secondary | ICD-10-CM | POA: Insufficient documentation

## 2016-02-23 DIAGNOSIS — N189 Chronic kidney disease, unspecified: Secondary | ICD-10-CM

## 2016-02-23 DIAGNOSIS — Z8673 Personal history of transient ischemic attack (TIA), and cerebral infarction without residual deficits: Secondary | ICD-10-CM

## 2016-02-23 DIAGNOSIS — Z87891 Personal history of nicotine dependence: Secondary | ICD-10-CM | POA: Insufficient documentation

## 2016-02-23 LAB — CBC WITH DIFFERENTIAL/PLATELET
BASOS ABS: 0 10*3/uL (ref 0–0.1)
Basophils Relative: 1 %
EOS ABS: 0.1 10*3/uL (ref 0–0.7)
EOS PCT: 3 %
HCT: 27.6 % — ABNORMAL LOW (ref 35.0–47.0)
Hemoglobin: 8.4 g/dL — ABNORMAL LOW (ref 12.0–16.0)
LYMPHS PCT: 28 %
Lymphs Abs: 1.3 10*3/uL (ref 1.0–3.6)
MCH: 22.5 pg — AB (ref 26.0–34.0)
MCHC: 30.5 g/dL — ABNORMAL LOW (ref 32.0–36.0)
MCV: 73.8 fL — AB (ref 80.0–100.0)
MONO ABS: 0.6 10*3/uL (ref 0.2–0.9)
Monocytes Relative: 12 %
Neutro Abs: 2.8 10*3/uL (ref 1.4–6.5)
Neutrophils Relative %: 56 %
PLATELETS: 160 10*3/uL (ref 150–440)
RBC: 3.75 MIL/uL — ABNORMAL LOW (ref 3.80–5.20)
RDW: 20.6 % — AB (ref 11.5–14.5)
WBC: 4.9 10*3/uL (ref 3.6–11.0)

## 2016-02-23 LAB — COMPREHENSIVE METABOLIC PANEL
ALT: 31 U/L (ref 14–54)
AST: 69 U/L — AB (ref 15–41)
Albumin: 3.4 g/dL — ABNORMAL LOW (ref 3.5–5.0)
Alkaline Phosphatase: 211 U/L — ABNORMAL HIGH (ref 38–126)
Anion gap: 11 (ref 5–15)
BUN: <5 mg/dL — ABNORMAL LOW (ref 6–20)
CHLORIDE: 113 mmol/L — AB (ref 101–111)
CO2: 17 mmol/L — ABNORMAL LOW (ref 22–32)
Calcium: 8.9 mg/dL (ref 8.9–10.3)
Creatinine, Ser: 0.74 mg/dL (ref 0.44–1.00)
Glucose, Bld: 111 mg/dL — ABNORMAL HIGH (ref 65–99)
POTASSIUM: 3.4 mmol/L — AB (ref 3.5–5.1)
SODIUM: 141 mmol/L (ref 135–145)
Total Bilirubin: 0.9 mg/dL (ref 0.3–1.2)
Total Protein: 8.6 g/dL — ABNORMAL HIGH (ref 6.5–8.1)

## 2016-02-23 LAB — LACTIC ACID, PLASMA: LACTIC ACID, VENOUS: 1.7 mmol/L (ref 0.5–1.9)

## 2016-02-23 NOTE — Telephone Encounter (Signed)
Patient called wanting clarity on instructions from this morning. Told patient she is to go to the ED to get evaluated and not for an admission. Explained doctors will evaluate her and see if an admission is needed. Told patient doctors will be able to see any notes Dr. Baxter Flattery put in the chart. Patient stated understanding. Rodman Key, LPn

## 2016-02-23 NOTE — Telephone Encounter (Signed)
Per Dr Baxter Flattery called [facility] to have them extend the patient IV medication for another 2 weeks.  Per Baxter Flattery called [facility] to schedule the patient CT Head appointment set for 02/29/16 at 3 pm at [facility], [address] [place], [phone].  Called the patient to inform her of the extension and scan appt and the patient advised she had her last dose of medication 4 days ago. She also states that she missed an appt with Dr Christella Noa yesterday 02/22/16 and she forgot as she forgets a lot these days.  The patient also reports that for the past 2 weeks she has had a constant headache, nose bleed on the side she had surgery on that will not stop, nausea and vomiting. She also advised she is now dragging her left leg when she walks with her walker and she has to have help getting up on the walker. She can not get up on her own.   After discussing the patient with the doctor was advised to have her go to the ED for evaluation. Called the patient and advised of this and she does not want to go to the ED she advised she has had several bad experiences there. Advised her she really needs to be seen and that is the best way to make that happen. Advised her to just try to go and tell them what is going on with her and let them know how bad she feels and how long she has had the headaches. She was very reluctant but she advised she will have someone drive her. Advised her will check on her later today to make sure she goes. She advises she going to [facility]. Advise just go somewhere to be checked out. Advised her will let Dr Baxter Flattery know she is going as well. Ended the call.

## 2016-02-23 NOTE — Telephone Encounter (Signed)
-----   Message from Carlyle Basques, MD sent at 02/22/2016  2:54 PM EDT ----- Can you call [facility] and extend abtx (vanco and ertapenem) for 2 more weeks. I placed a head ct w and wo order for [facility].

## 2016-02-23 NOTE — ED Triage Notes (Addendum)
Pt reports doctor sent her for admission. Pt has gotten IV abx for last month for brain infection. Has had part of skull removed. Infection not better per pt which is why doctor sent her for admission. PICC line to RUE. Pt tearful and scared. Per computer note in chart pt has also had nausea, vomiting and headaches. Note reports pt to come to ed for assessment if admission needed.

## 2016-02-24 ENCOUNTER — Emergency Department: Payer: Medicare Other

## 2016-02-24 ENCOUNTER — Inpatient Hospital Stay
Admission: EM | Admit: 2016-02-24 | Discharge: 2016-02-29 | DRG: 315 | Disposition: A | Discharge status: Home / Self Care | Payer: Medicare Other | Attending: Internal Medicine | Admitting: Internal Medicine

## 2016-02-24 ENCOUNTER — Telehealth: Payer: Self-pay | Admitting: Internal Medicine

## 2016-02-24 ENCOUNTER — Encounter: Payer: Self-pay | Admitting: Emergency Medicine

## 2016-02-24 DIAGNOSIS — N3090 Cystitis, unspecified without hematuria: Secondary | ICD-10-CM | POA: Diagnosis present

## 2016-02-24 DIAGNOSIS — K7460 Unspecified cirrhosis of liver: Secondary | ICD-10-CM | POA: Diagnosis present

## 2016-02-24 DIAGNOSIS — F1110 Opioid abuse, uncomplicated: Secondary | ICD-10-CM | POA: Diagnosis present

## 2016-02-24 DIAGNOSIS — Z8249 Family history of ischemic heart disease and other diseases of the circulatory system: Secondary | ICD-10-CM

## 2016-02-24 DIAGNOSIS — T80211A Bloodstream infection due to central venous catheter, initial encounter: Secondary | ICD-10-CM | POA: Diagnosis not present

## 2016-02-24 DIAGNOSIS — Z91128 Patient's intentional underdosing of medication regimen for other reason: Secondary | ICD-10-CM

## 2016-02-24 DIAGNOSIS — Z6838 Body mass index (BMI) 38.0-38.9, adult: Secondary | ICD-10-CM

## 2016-02-24 DIAGNOSIS — Z880 Allergy status to penicillin: Secondary | ICD-10-CM

## 2016-02-24 DIAGNOSIS — Z792 Long term (current) use of antibiotics: Secondary | ICD-10-CM

## 2016-02-24 DIAGNOSIS — Z888 Allergy status to other drugs, medicaments and biological substances status: Secondary | ICD-10-CM

## 2016-02-24 DIAGNOSIS — Z79899 Other long term (current) drug therapy: Secondary | ICD-10-CM

## 2016-02-24 DIAGNOSIS — R0602 Shortness of breath: Secondary | ICD-10-CM

## 2016-02-24 DIAGNOSIS — M869 Osteomyelitis, unspecified: Secondary | ICD-10-CM | POA: Diagnosis present

## 2016-02-24 DIAGNOSIS — Z8673 Personal history of transient ischemic attack (TIA), and cerebral infarction without residual deficits: Secondary | ICD-10-CM

## 2016-02-24 DIAGNOSIS — E1169 Type 2 diabetes mellitus with other specified complication: Secondary | ICD-10-CM | POA: Diagnosis present

## 2016-02-24 DIAGNOSIS — R519 Headache, unspecified: Secondary | ICD-10-CM | POA: Diagnosis present

## 2016-02-24 DIAGNOSIS — E669 Obesity, unspecified: Secondary | ICD-10-CM | POA: Diagnosis present

## 2016-02-24 DIAGNOSIS — G40909 Epilepsy, unspecified, not intractable, without status epilepticus: Secondary | ICD-10-CM | POA: Diagnosis present

## 2016-02-24 DIAGNOSIS — T3696XA Underdosing of unspecified systemic antibiotic, initial encounter: Secondary | ICD-10-CM | POA: Diagnosis present

## 2016-02-24 DIAGNOSIS — B49 Unspecified mycosis: Secondary | ICD-10-CM | POA: Diagnosis present

## 2016-02-24 DIAGNOSIS — F0390 Unspecified dementia without behavioral disturbance: Secondary | ICD-10-CM | POA: Diagnosis present

## 2016-02-24 DIAGNOSIS — R51 Headache: Secondary | ICD-10-CM | POA: Diagnosis not present

## 2016-02-24 DIAGNOSIS — Z833 Family history of diabetes mellitus: Secondary | ICD-10-CM

## 2016-02-24 DIAGNOSIS — Z86011 Personal history of benign neoplasm of the brain: Secondary | ICD-10-CM

## 2016-02-24 DIAGNOSIS — F0280 Dementia in other diseases classified elsewhere without behavioral disturbance: Secondary | ICD-10-CM

## 2016-02-24 DIAGNOSIS — I10 Essential (primary) hypertension: Secondary | ICD-10-CM | POA: Diagnosis present

## 2016-02-24 DIAGNOSIS — E119 Type 2 diabetes mellitus without complications: Secondary | ICD-10-CM

## 2016-02-24 DIAGNOSIS — G8929 Other chronic pain: Secondary | ICD-10-CM | POA: Diagnosis present

## 2016-02-24 DIAGNOSIS — D638 Anemia in other chronic diseases classified elsewhere: Secondary | ICD-10-CM | POA: Diagnosis present

## 2016-02-24 DIAGNOSIS — Z885 Allergy status to narcotic agent status: Secondary | ICD-10-CM

## 2016-02-24 DIAGNOSIS — Z881 Allergy status to other antibiotic agents status: Secondary | ICD-10-CM

## 2016-02-24 DIAGNOSIS — Z882 Allergy status to sulfonamides status: Secondary | ICD-10-CM

## 2016-02-24 DIAGNOSIS — Z9119 Patient's noncompliance with other medical treatment and regimen: Secondary | ICD-10-CM

## 2016-02-24 DIAGNOSIS — Z87891 Personal history of nicotine dependence: Secondary | ICD-10-CM

## 2016-02-24 DIAGNOSIS — J449 Chronic obstructive pulmonary disease, unspecified: Secondary | ICD-10-CM | POA: Diagnosis present

## 2016-02-24 DIAGNOSIS — B999 Unspecified infectious disease: Secondary | ICD-10-CM | POA: Diagnosis present

## 2016-02-24 DIAGNOSIS — Y92009 Unspecified place in unspecified non-institutional (private) residence as the place of occurrence of the external cause: Secondary | ICD-10-CM

## 2016-02-24 LAB — CBC WITH DIFFERENTIAL/PLATELET
BASOS PCT: 1 %
Basophils Absolute: 0 10*3/uL (ref 0–0.1)
EOS ABS: 0.2 10*3/uL (ref 0–0.7)
Eosinophils Relative: 3 %
HEMATOCRIT: 26.3 % — AB (ref 35.0–47.0)
HEMOGLOBIN: 8.2 g/dL — AB (ref 12.0–16.0)
Lymphocytes Relative: 27 %
Lymphs Abs: 1.2 10*3/uL (ref 1.0–3.6)
MCH: 22.3 pg — ABNORMAL LOW (ref 26.0–34.0)
MCHC: 31.2 g/dL — AB (ref 32.0–36.0)
MCV: 71.5 fL — ABNORMAL LOW (ref 80.0–100.0)
MONOS PCT: 14 %
Monocytes Absolute: 0.7 10*3/uL (ref 0.2–0.9)
NEUTROS ABS: 2.5 10*3/uL (ref 1.4–6.5)
NEUTROS PCT: 55 %
Platelets: 149 10*3/uL — ABNORMAL LOW (ref 150–440)
RBC: 3.68 MIL/uL — AB (ref 3.80–5.20)
RDW: 20.7 % — ABNORMAL HIGH (ref 11.5–14.5)
WBC: 4.6 10*3/uL (ref 3.6–11.0)

## 2016-02-24 LAB — COMPREHENSIVE METABOLIC PANEL
ALBUMIN: 3.2 g/dL — AB (ref 3.5–5.0)
ALK PHOS: 214 U/L — AB (ref 38–126)
ALT: 28 U/L (ref 14–54)
ANION GAP: 9 (ref 5–15)
AST: 61 U/L — AB (ref 15–41)
BILIRUBIN TOTAL: 0.8 mg/dL (ref 0.3–1.2)
CALCIUM: 8.7 mg/dL — AB (ref 8.9–10.3)
CO2: 22 mmol/L (ref 22–32)
CREATININE: 0.85 mg/dL (ref 0.44–1.00)
Chloride: 108 mmol/L (ref 101–111)
GFR calc Af Amer: >60 mL/min (ref >60)
GFR calc non Af Amer: >60 mL/min (ref >60)
GLUCOSE: 78 mg/dL (ref 65–99)
Potassium: 3.4 mmol/L — ABNORMAL LOW (ref 3.5–5.1)
SODIUM: 139 mmol/L (ref 135–145)
TOTAL PROTEIN: 8.6 g/dL — AB (ref 6.5–8.1)

## 2016-02-24 LAB — URINALYSIS COMPLETE WITH MICROSCOPIC (ARMC ONLY)
BILIRUBIN URINE: NEGATIVE
GLUCOSE, UA: NEGATIVE mg/dL
Hgb urine dipstick: NEGATIVE
KETONES UR: NEGATIVE mg/dL
Nitrite: NEGATIVE
PH: 6 (ref 5.0–8.0)
Protein, ur: NEGATIVE mg/dL
SQUAMOUS EPITHELIAL / LPF: NONE SEEN
Specific Gravity, Urine: 1.003 — ABNORMAL LOW (ref 1.005–1.030)

## 2016-02-24 LAB — GLUCOSE, CAPILLARY: GLUCOSE-CAPILLARY: 84 mg/dL (ref 65–99)

## 2016-02-24 LAB — MRSA PCR SCREENING: MRSA BY PCR: NEGATIVE

## 2016-02-24 LAB — LACTIC ACID, PLASMA: Lactic Acid, Venous: 1.5 mmol/L (ref 0.5–1.9)

## 2016-02-24 MED ORDER — DIPHENHYDRAMINE HCL 25 MG PO CAPS
25.0000 mg | ORAL_CAPSULE | Freq: Three times a day (TID) | ORAL | Status: DC | PRN
Start: 2016-02-24 — End: 2016-02-29
  Administered 2016-02-24: 25 mg via ORAL
  Filled 2016-02-24 (×2): qty 1

## 2016-02-24 MED ORDER — ACETAMINOPHEN 325 MG PO TABS
650.0000 mg | ORAL_TABLET | Freq: Four times a day (QID) | ORAL | Status: DC | PRN
  Administered 2016-02-26 – 2016-02-27 (×2): 650 mg via ORAL
  Filled 2016-02-24 (×2): qty 2

## 2016-02-24 MED ORDER — SODIUM CHLORIDE 0.9 % IV SOLN
1250.0000 mg | Freq: Two times a day (BID) | INTRAVENOUS | Status: DC
  Administered 2016-02-25 – 2016-02-26 (×3): 1250 mg via INTRAVENOUS
  Filled 2016-02-24 (×5): qty 1250

## 2016-02-24 MED ORDER — INSULIN ASPART 100 UNIT/ML ~~LOC~~ SOLN
0.0000 [IU] | Freq: Three times a day (TID) | SUBCUTANEOUS | Status: DC
  Administered 2016-02-25 – 2016-02-26 (×2): 1 [IU] via SUBCUTANEOUS
  Administered 2016-02-29: 2 [IU] via SUBCUTANEOUS
  Filled 2016-02-24: qty 2
  Filled 2016-02-24 (×2): qty 1

## 2016-02-24 MED ORDER — GABAPENTIN 300 MG PO CAPS
600.0000 mg | ORAL_CAPSULE | Freq: Two times a day (BID) | ORAL | Status: DC
  Administered 2016-02-24 – 2016-02-29 (×10): 600 mg via ORAL
  Filled 2016-02-24 (×10): qty 2

## 2016-02-24 MED ORDER — ALBUTEROL SULFATE (2.5 MG/3ML) 0.083% IN NEBU: 3.0000 mL | INHALATION_SOLUTION | Freq: Four times a day (QID) | RESPIRATORY_TRACT | Status: DC | PRN

## 2016-02-24 MED ORDER — DOCUSATE SODIUM 100 MG PO CAPS
100.0000 mg | ORAL_CAPSULE | Freq: Two times a day (BID) | ORAL | Status: DC
  Administered 2016-02-24 – 2016-02-29 (×9): 100 mg via ORAL
  Filled 2016-02-24 (×9): qty 1

## 2016-02-24 MED ORDER — TRAMADOL HCL 50 MG PO TABS: 50.0000 mg | ORAL_TABLET | Freq: Four times a day (QID) | ORAL | Status: DC | PRN

## 2016-02-24 MED ORDER — IPRATROPIUM-ALBUTEROL 0.5-2.5 (3) MG/3ML IN SOLN
3.0000 mL | Freq: Four times a day (QID) | RESPIRATORY_TRACT | Status: DC
  Administered 2016-02-24 – 2016-02-25 (×2): 3 mL via RESPIRATORY_TRACT
  Filled 2016-02-24 (×2): qty 3

## 2016-02-24 MED ORDER — FUROSEMIDE 20 MG PO TABS
20.0000 mg | ORAL_TABLET | ORAL | Status: DC
  Administered 2016-02-25 – 2016-02-29 (×5): 20 mg via ORAL
  Filled 2016-02-24 (×5): qty 1

## 2016-02-24 MED ORDER — IPRATROPIUM-ALBUTEROL 0.5-2.5 (3) MG/3ML IN SOLN
3.0000 mL | Freq: Once | RESPIRATORY_TRACT | Status: AC
  Administered 2016-02-24: 3 mL via RESPIRATORY_TRACT
  Filled 2016-02-24: qty 3

## 2016-02-24 MED ORDER — IPRATROPIUM-ALBUTEROL 0.5-2.5 (3) MG/3ML IN SOLN
3.0000 mL | Freq: Four times a day (QID) | RESPIRATORY_TRACT | Status: DC | PRN
  Administered 2016-02-25: 3 mL via RESPIRATORY_TRACT
  Filled 2016-02-24: qty 3

## 2016-02-24 MED ORDER — TRIAMCINOLONE ACETONIDE 55 MCG/ACT NA AERO
2.0000 | INHALATION_SPRAY | Freq: Every day | NASAL | Status: DC | PRN
  Filled 2016-02-24: qty 21.6

## 2016-02-24 MED ORDER — PROCHLORPERAZINE MALEATE 10 MG PO TABS: 10.0000 mg | ORAL_TABLET | Freq: Two times a day (BID) | ORAL | Status: DC | PRN

## 2016-02-24 MED ORDER — POTASSIUM CHLORIDE IN NACL 40-0.9 MEQ/L-% IV SOLN
INTRAVENOUS | Status: DC
  Administered 2016-02-24 – 2016-02-29 (×7): 75 mL/h via INTRAVENOUS
  Filled 2016-02-24 (×11): qty 1000

## 2016-02-24 MED ORDER — ACETAMINOPHEN 650 MG RE SUPP: 650.0000 mg | Freq: Four times a day (QID) | RECTAL | Status: DC | PRN

## 2016-02-24 MED ORDER — BISACODYL 10 MG RE SUPP: 10.0000 mg | Freq: Every day | RECTAL | Status: DC | PRN

## 2016-02-24 MED ORDER — HEPARIN SODIUM (PORCINE) 5000 UNIT/ML IJ SOLN
5000.0000 [IU] | Freq: Three times a day (TID) | INTRAMUSCULAR | Status: DC
  Administered 2016-02-24 – 2016-02-29 (×14): 5000 [IU] via SUBCUTANEOUS
  Filled 2016-02-24 (×14): qty 1

## 2016-02-24 MED ORDER — PANCRELIPASE (LIP-PROT-AMYL) 12000-38000 UNITS PO CPEP
24000.0000 [IU] | ORAL_CAPSULE | Freq: Three times a day (TID) | ORAL | Status: DC
  Administered 2016-02-25 – 2016-02-29 (×12): 24000 [IU] via ORAL
  Filled 2016-02-24 (×12): qty 2

## 2016-02-24 MED ORDER — AMITRIPTYLINE HCL 25 MG PO TABS
50.0000 mg | ORAL_TABLET | Freq: Every day | ORAL | Status: DC
  Administered 2016-02-24 – 2016-02-25 (×2): 50 mg via ORAL
  Filled 2016-02-24 (×2): qty 2

## 2016-02-24 MED ORDER — ALPRAZOLAM 1 MG PO TABS
1.0000 mg | ORAL_TABLET | Freq: Two times a day (BID) | ORAL | Status: DC | PRN
  Administered 2016-02-24 – 2016-02-28 (×6): 1 mg via ORAL
  Filled 2016-02-24: qty 1
  Filled 2016-02-24: qty 2
  Filled 2016-02-24 (×4): qty 1

## 2016-02-24 MED ORDER — LEVETIRACETAM 500 MG PO TABS
500.0000 mg | ORAL_TABLET | Freq: Two times a day (BID) | ORAL | Status: DC
  Administered 2016-02-24 – 2016-02-29 (×10): 500 mg via ORAL
  Filled 2016-02-24 (×10): qty 1

## 2016-02-24 MED ORDER — PROMETHAZINE HCL 25 MG PO TABS
25.0000 mg | ORAL_TABLET | Freq: Three times a day (TID) | ORAL | Status: DC | PRN
  Administered 2016-02-27: 09:00:00 25 mg via ORAL
  Filled 2016-02-24: qty 1

## 2016-02-24 MED ORDER — MOMETASONE FURO-FORMOTEROL FUM 200-5 MCG/ACT IN AERO
2.0000 | INHALATION_SPRAY | Freq: Two times a day (BID) | RESPIRATORY_TRACT | Status: DC
  Administered 2016-02-24 – 2016-02-29 (×10): 2 via RESPIRATORY_TRACT
  Filled 2016-02-24: qty 8.8

## 2016-02-24 MED ORDER — DIPHENOXYLATE-ATROPINE 2.5-0.025 MG PO TABS
1.0000 | ORAL_TABLET | Freq: Three times a day (TID) | ORAL | Status: DC
  Administered 2016-02-25 – 2016-02-27 (×5): 1 via ORAL
  Filled 2016-02-24 (×6): qty 1

## 2016-02-24 MED ORDER — PROMETHAZINE HCL 25 MG PO TABS: 12.5000 mg | ORAL_TABLET | Freq: Four times a day (QID) | ORAL | Status: DC | PRN

## 2016-02-24 MED ORDER — IPRATROPIUM-ALBUTEROL 0.5-2.5 (3) MG/3ML IN SOLN
RESPIRATORY_TRACT | Status: AC
  Filled 2016-02-24: qty 3

## 2016-02-24 MED ORDER — SPIRONOLACTONE 25 MG PO TABS
50.0000 mg | ORAL_TABLET | Freq: Two times a day (BID) | ORAL | Status: DC
  Administered 2016-02-24 – 2016-02-29 (×10): 50 mg via ORAL
  Filled 2016-02-24 (×10): qty 2

## 2016-02-24 MED ORDER — TIOTROPIUM BROMIDE MONOHYDRATE 18 MCG IN CAPS
18.0000 ug | ORAL_CAPSULE | Freq: Every day | RESPIRATORY_TRACT | Status: DC | PRN
  Filled 2016-02-24: qty 5

## 2016-02-24 MED ORDER — VANCOMYCIN HCL 10 G IV SOLR
1500.0000 mg | Freq: Once | INTRAVENOUS | Status: AC
  Administered 2016-02-24: 1500 mg via INTRAVENOUS
  Filled 2016-02-24: qty 1500

## 2016-02-24 MED ORDER — AMLODIPINE BESYLATE 10 MG PO TABS
10.0000 mg | ORAL_TABLET | Freq: Every day | ORAL | Status: DC
  Administered 2016-02-25 – 2016-02-29 (×5): 10 mg via ORAL
  Filled 2016-02-24 (×5): qty 1

## 2016-02-24 MED ORDER — ZOLPIDEM TARTRATE 5 MG PO TABS
10.0000 mg | ORAL_TABLET | Freq: Every evening | ORAL | Status: DC | PRN
  Administered 2016-02-24 – 2016-02-25 (×2): 10 mg via ORAL
  Filled 2016-02-24 (×2): qty 2

## 2016-02-24 MED ORDER — SODIUM CHLORIDE 0.9 % IV SOLN
1.0000 g | Freq: Three times a day (TID) | INTRAVENOUS | Status: DC
  Administered 2016-02-25 – 2016-02-26 (×4): 1 g via INTRAVENOUS
  Filled 2016-02-24 (×8): qty 1

## 2016-02-24 MED ORDER — MORPHINE SULFATE (PF) 2 MG/ML IV SOLN
2.0000 mg | INTRAVENOUS | Status: DC | PRN
  Administered 2016-02-24 – 2016-02-26 (×7): 2 mg via INTRAVENOUS
  Filled 2016-02-24 (×7): qty 1

## 2016-02-24 MED ORDER — SODIUM CHLORIDE 0.9 % IV SOLN
1.0000 g | Freq: Once | INTRAVENOUS | Status: AC
  Administered 2016-02-24: 1 g via INTRAVENOUS
  Filled 2016-02-24: qty 1

## 2016-02-24 MED ORDER — NYSTATIN 100000 UNIT/GM EX OINT
TOPICAL_OINTMENT | Freq: Two times a day (BID) | CUTANEOUS | Status: DC
  Administered 2016-02-24 – 2016-02-29 (×10): via TOPICAL
  Filled 2016-02-24: qty 15

## 2016-02-24 MED ORDER — FAMOTIDINE IN NACL 20-0.9 MG/50ML-% IV SOLN
20.0000 mg | Freq: Two times a day (BID) | INTRAVENOUS | Status: DC
  Administered 2016-02-24 – 2016-02-26 (×4): 20 mg via INTRAVENOUS
  Filled 2016-02-24 (×5): qty 50

## 2016-02-24 MED ORDER — PANTOPRAZOLE SODIUM 40 MG PO TBEC
40.0000 mg | DELAYED_RELEASE_TABLET | Freq: Every day | ORAL | Status: DC
  Administered 2016-02-25 – 2016-02-29 (×5): 40 mg via ORAL
  Filled 2016-02-24 (×5): qty 1

## 2016-02-24 MED ORDER — PHENYTOIN 125 MG/5ML PO SUSP
300.0000 mg | Freq: Two times a day (BID) | ORAL | Status: DC
  Administered 2016-02-24 – 2016-02-29 (×10): 300 mg via ORAL
  Filled 2016-02-24 (×11): qty 12

## 2016-02-24 NOTE — Telephone Encounter (Signed)
Patient reports feeling unwell, headache, epistaxis, low grade fevers. I recommended to go back to [facility] for evaluation. I have called charge nurse at [facility] to explain my concern that she is being treated for neurosurgical infection, skull based osteo nad still concern that she has smoldering infection. I would ask that the patient get evaluated by MD, et admitted. minimum of repeat head imaging for further evaluation. She has missed follow ups appointment with dr. Christella Noa as well as ID but is willing to go the [facility] for eval.

## 2016-02-24 NOTE — ED Triage Notes (Signed)
Patient brought in by [facility] from home, patient states that she recently had a neurological fla infection. Patient has been having nose bleeds, N/V, and headaches. Patient states that Dr. Baxter Flattery sent her to the ER to be admitted.

## 2016-02-24 NOTE — Progress Notes (Signed)
Pharmacy Antibiotic Note  [name] is a 60 y.o. 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  Pharmacy has been consulted for Vancomycin and Meropenem dosing.  Ke=0.083 T1/2=8.3 Vd=58.6 L  Plan: Vancomycin 1250 IV every 12 hours.  Goal trough 15-20 mcg/mL. Meropenem 1g IV q8h  Height: 5\' 8"  (172.7 cm) Weight: 250 lb (113.4 kg) IBW/kg (Calculated) : 63.9  Temp (24hrs), Avg:98.7 F (37.1 C), Min:98.5 F (36.9 C), Max:98.8 F (37.1 C)   Recent Labs Lab 02/23/16 1603 02/23/16 1606 02/24/16 1555 02/24/16 1556  WBC  --  4.9  --  4.6  CREATININE  --  0.74  --  0.85  LATICACIDVEN 1.7  --  1.5  --     Estimated Creatinine Clearance: 95.3 mL/min (by C-G formula based on SCr of 0.85 mg/dL).    Allergies  Allergen Reactions  . Ibuprofen Other (See Comments)    Pt states that she is unable to take this medication because of her liver.    . Sulfa Antibiotics Hives  . Hydroxyzine Other (See Comments)    Causes crying   . Amoxicillin Itching, Rash and Other (See Comments)    Has patient had a PCN reaction causing immediate rash, facial/tongue/throat swelling, SOB or lightheadedness with hypotension: No Has patient had a PCN reaction causing severe rash involving mucus membranes or skin necrosis: No Has patient had a PCN reaction that required hospitalization No Has patient had a PCN reaction occurring within the last 10 years: Yes If all of the above answers are "NO", then may proceed with Cephalosporin use.  . Chocolate Rash  . Ciprofloxacin Itching and Rash  . Dilaudid [Hydromorphone Hcl] Itching  . Fentanyl Rash  . Hydromorphone Itching  . Metformin Nausea And Vomiting  . Penicillins Itching, Rash and Other (See Comments)    Has patient had a PCN reaction causing immediate rash, facial/tongue/throat swelling, SOB or lightheadedness with hypotension: No Has patient had a PCN reaction causing severe rash involving mucus membranes or skin necrosis:  No Has patient had a PCN reaction that required hospitalization No Has patient had a PCN reaction occurring within the last 10 years: Yes If all of the above answers are "NO", then may proceed with Cephalosporin use.  . Strawberry Extract Rash  . Zofran [Ondansetron Hcl] Itching and Nausea And Vomiting    Antimicrobials this admission: Vancomycin 8/19 >>  Meropenem 8/19 >>   Dose adjustments this admission:  Microbiology results:  Thank you for allowing pharmacy to be a part of this patient's care.  Paulina Fusi, PharmD, BCPS 02/24/2016 7:14 PM

## 2016-02-24 NOTE — ED Notes (Signed)
Called floor to let them know pt on the way up 

## 2016-02-24 NOTE — ED Provider Notes (Signed)
[facility] Emergency Department Provider Note   ____________________________________________    I have reviewed the triage vital signs and the nursing notes.   HISTORY  Chief Complaint Headache and fever    HPI [name] is a 60 y.o. female who presents with points of worsening pain in the head and fever up to 103.0 today. Apparently she spoke with Dr. Graylon Good of [facility] who recommended coming to the emergency department for admission. Patient has a history of osteomyelitis of the skull and has a PICC line and receives vancomycin daily. Patient reports fevers at home. Denies neck pain. No nausea vomiting. She does have cough. She denies neuro deficits.   Past Medical History:  Diagnosis Date  . Allergy   . Chronic bronchitis ([place])   . Chronic kidney disease   . Cirrhosis ([place])   . Depression   . Diabetes mellitus without complication ([place])   . Diverticulitis   . Emphysema of lung ([place])   . GERD (gastroesophageal reflux disease)   . H/O drug abuse    Clean since 2009  . H/O ETOH abuse    Sober since 2009  . Hepatitis C   . Hypertension   . Malignant brain tumor ([place]) 2007  . Migraines   . Pancreatic ascites   . Pancreatitis, alcoholic 6578  . Rectovaginal fistula   . Seizures ([place])   . Stroke [facility]) 2007   during brain surgery  . Urine incontinence     Patient Active Problem List   Diagnosis Date Noted  . 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 Encephalopathy   . Status post craniectomy 12/28/2015  . Wound infection after surgery 12/26/2015  . UTI (lower urinary tract infection) 10/22/2015  . Chest pain on breathing   . SOB (shortness of breath)   . Wheezing   . Subdural hematoma ([place]) 08/16/2015  . Sepsis ([place]) 06/22/2015  . Orthostatic hypotension 05/15/2015  . ALC (alcoholic liver cirrhosis) ([place]) 12/23/2014  . Clinical depression 12/23/2014  . Anxiety 12/23/2014  . Diabetes mellitus, type 2 ([place])  12/23/2014  . HTN (hypertension) 11/05/2014  . Liver cirrhosis ([place]) 07/19/2014  . 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 Chronic pancreatitis ([place]) 11/03/2012  . Seizure disorder ([place]) 11/03/2012  . Benign meningioma of brain ([place]) 11/03/2012  . Chronic pelvic pain in female 11/03/2012  . Hepatitis C virus infection without hepatic coma 11/03/2012  . UTI (urinary tract infection) 11/03/2012  . Alcohol abuse, unspecified 11/03/2012  . Chronic liver disease 11/03/2012  . Schizophrenia ([place]) 11/03/2012  . Narcotic abuse 11/03/2012  . 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 Breast screening 11/03/2012  . 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 Nondependent alcohol abuse 11/03/2012  . 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 Female genital symptoms 11/03/2012    Past Surgical History:  Procedure Laterality Date  . ABDOMINAL HYSTERECTOMY  1979  . APPENDECTOMY  1999  . BRAIN SURGERY  2007   malignant brain tumor  . CHOLECYSTECTOMY  2001  . CRANIOPLASTY N/A 09/15/2015   Procedure: Cranioplasty with retrieval of bone flap from abdominal pocket;  Surgeon: Ashok Pall, MD;  Location: [place] NEURO ORS;  Service: Neurosurgery;  Laterality: N/A;  Cranioplasty with retrieval of bone flap from abdominal pocket  . CRANIOTOMY Right 08/16/2015   Procedure: Right Fronto-Temporal-Parietal Craniotomy for Evacuation of Hematoma with Bone Flap Placement in Abdomen;  Surgeon: Ashok Pall, MD;  Location: [place] NEURO ORS;  Service: Neurosurgery;  Laterality: Right;  . CRANIOTOMY N/A 12/28/2015   Procedure: Craniectomy for wound debridement;  Surgeon: Ashok Pall, MD;  Location: [place] NEURO ORS;  Service: Neurosurgery;  Laterality: N/A;  Craniectomy for wound debridement  . EYE SURGERY    . rectovaginal fistula repair w/ colostomy  2011    Prior to Admission medications   Medication Sig Start Date End Date Taking? Authorizing Provider  albuterol  (PROVENTIL HFA;VENTOLIN HFA) 108 (90 Base) MCG/ACT inhaler Inhale 2 puffs into the lungs every 6 (six) hours as needed for wheezing or shortness of breath.    Historical Provider, MD  albuterol (PROVENTIL HFA;VENTOLIN HFA) 108 (90 Base) MCG/ACT inhaler Inhale into the lungs.    Historical Provider, MD  albuterol (PROVENTIL) (5 MG/ML) 0.5% nebulizer solution Take 2.5 mg by nebulization every 6 (six) hours as needed for wheezing or shortness of breath.     Historical Provider, MD  albuterol (PROVENTIL) (5 MG/ML) 0.5% nebulizer solution 2.5 mg. 06/16/15   Historical Provider, MD  ALPRAZolam Duanne Moron) 1 MG tablet Take 1 tablet (1 mg total) by mouth 2 (two) times daily as needed for anxiety. 01/05/16   Ashok Pall, MD  amitriptyline (ELAVIL) 50 MG tablet Take 1 tablet (50 mg total) by mouth at bedtime. 12/01/12   Jackolyn Confer, MD  amitriptyline (ELAVIL) 50 MG tablet Take 50 mg by mouth. 12/11/15   Historical Provider, MD  amLODipine (NORVASC) 10 MG tablet Take 10 mg by mouth daily.    Historical Provider, MD  Blood Glucose Monitoring Suppl (FIFTY50 GLUCOSE METER 2.0) w/Device KIT ICD-10 E11.9 Check blood sugars once daily 09/04/15   Historical Provider, MD  cephALEXin (KEFLEX) 500 MG capsule take 1 capsule by mouth three times a day 12/12/15   Historical Provider, MD  ciprofloxacin (CIPRO) 500 MG tablet Take 500 mg by mouth 2 (two) times daily. for 10 days 11/11/15   Historical Provider, MD  diphenhydrAMINE (BENADRYL) 25 mg capsule Take 1 capsule (25 mg total) by mouth every 8 (eight) hours as needed for itching. 10/25/15   Vaughan Basta, MD  diphenoxylate-atropine (LOMOTIL) 2.5-0.025 MG tablet Take 1 tablet by mouth 3 (three) times daily. 12/25/15   Historical Provider, MD  ENSURE (ENSURE) Take by mouth. 11/10/15   Historical Provider, MD  Fluticasone-Salmeterol (ADVAIR DISKUS) 500-50 MCG/DOSE AEPB Inhale into the lungs. 05/11/15   Historical Provider, MD  Fluticasone-Salmeterol (ADVAIR) 250-50 MCG/DOSE AEPB  Inhale 2 puffs into the lungs every 6 (six) hours as needed (for wheezing or shortness of breath).     Historical Provider, MD  furosemide (LASIX) 20 MG tablet Take 20 mg by mouth daily.    Historical Provider, MD  gabapentin (NEURONTIN) 300 MG capsule Take 600 mg by mouth at bedtime.     Historical Provider, MD  gabapentin (NEURONTIN) 300 MG capsule Take 600 mg by mouth. 12/11/15   Historical Provider, MD  hydrOXYzine (ATARAX/VISTARIL) 25 MG tablet TAKE 1 TABLET BY MOUTH  EVERY SIX HOURS AS NEEDED 11/28/15   Historical Provider, MD  Ipratropium-Albuterol (COMBIVENT) 20-100 MCG/ACT AERS respimat Inhale 2 puffs into the lungs every 6 (six) hours as needed for wheezing or shortness of breath.     Historical Provider, MD  levETIRAcetam (KEPPRA) 500 MG tablet Take 500 mg by mouth. 10/18/15   Historical Provider, MD  levETIRAcetam (KEPPRA) 750 MG tablet Take 750 mg by mouth 2 (two) times daily.    Historical Provider, MD  lipase/protease/amylase (CREON) 12000 units CPEP capsule Take 24,000 Units by mouth 3 (  three) times daily with meals.    Historical Provider, MD  lipase/protease/amylase (CREON) 12000 units CPEP capsule Take by mouth. 02/28/15   Historical Provider, MD  nicotine (NICODERM CQ - DOSED IN MG/24 HR) 7 mg/24hr patch Place onto the skin. 07/30/15   Historical Provider, MD  nitrofurantoin, macrocrystal-monohydrate, (MACROBID) 100 MG capsule take 1 capsule by mouth twice a day for 7 days 12/05/15   Historical Provider, MD  nystatin ointment (MYCOSTATIN) apply to affected area twice a day 01/10/16   Historical Provider, MD  omeprazole (PRILOSEC) 20 MG capsule Take 20 mg by mouth. 11/29/15   Historical Provider, MD  omeprazole (PRILOSEC) 40 MG capsule Take 40 mg by mouth daily. Reported on 01/20/2016    Historical Provider, MD  Oxycodone HCl 20 MG TABS Take 1 tablet (20 mg total) by mouth every 12 (twelve) hours as needed. Patient taking differently: Take 1 tablet by mouth 3 (three) times daily as needed  (for pain).  11/16/15   Rubie Maid, MD  Oxycodone HCl 20 MG TABS Take 20 mg by mouth. 10/18/15   Historical Provider, MD  phenytoin (DILANTIN) 100 MG/4ML suspension Take 300 mg by mouth 3 (three) times daily.    Historical Provider, MD  pravastatin (PRAVACHOL) 10 MG tablet Take 10 mg by mouth daily.     Historical Provider, MD  pravastatin (PRAVACHOL) 10 MG tablet Take 10 mg by mouth. 05/11/15   Historical Provider, MD  promethazine (PHENERGAN) 25 MG tablet Take 25 mg by mouth every 8 (eight) hours as needed for nausea or vomiting.    Historical Provider, MD  promethazine (PHENERGAN) 25 MG tablet Take 25 mg by mouth. 10/18/15   Historical Provider, MD  sertraline (ZOLOFT) 100 MG tablet Take 200 mg by mouth. 08/10/15   Historical Provider, MD  spironolactone (ALDACTONE) 50 MG tablet Take 1 tablet (50 mg total) by mouth 2 (two) times daily. 10/25/15   Vaughan Basta, MD  spironolactone (ALDACTONE) 50 MG tablet Take 50 mg by mouth. 10/18/15   Historical Provider, MD  sucralfate (CARAFATE) 1 g tablet Take 1 tablet by mouth 4  times daily 10/23/15   Historical Provider, MD  terconazole (TERAZOL 7) 0.4 % vaginal cream Place 1 applicator vaginally at bedtime. x7day 02/07/16   Rubie Maid, MD  tiotropium (SPIRIVA) 18 MCG inhalation capsule Place 18 mcg into inhaler and inhale daily as needed (shortness of breath).     Historical Provider, MD  tiotropium (SPIRIVA) 18 MCG inhalation capsule Place into inhaler and inhale. 05/05/15 05/04/16  Historical Provider, MD  traMADol (ULTRAM) 50 MG tablet Take by mouth. 08/10/14   Historical Provider, MD  triamcinolone (NASACORT ALLERGY 24HR) 55 MCG/ACT AERO nasal inhaler Place 2 sprays into the nose daily as needed (for rhinitis).    Historical Provider, MD  triamcinolone cream (KENALOG) 0.1 % Apply 1 application topically 2 (two) times daily. 02/07/16   Rubie Maid, MD  zolpidem (AMBIEN) 10 MG tablet Take 1 tablet (10 mg total) by mouth at bedtime as needed for sleep.  01/05/16 02/04/16  Ashok Pall, MD  zolpidem (AMBIEN) 10 MG tablet Take 10 mg by mouth. 06/16/15   Historical Provider, MD     Allergies Ibuprofen; Sulfa antibiotics; Hydroxyzine; Amoxicillin; Chocolate; Ciprofloxacin; Dilaudid [hydromorphone hcl]; Fentanyl; Hydromorphone; Metformin; Penicillins; Strawberry extract; and Zofran [ondansetron hcl]  Family History  Problem Relation Age of Onset  . Hypertension Mother   . Heart disease Father   . Diabetes Father   . Cancer Sister     brain  .  Cancer Grandchild 8    brain tumor    Social History Social History  Substance Use Topics  . Smoking status: Former Smoker    Packs/day: 0.50    Types: Cigarettes  . Smokeless tobacco: Never Used     Comment: cutting back  . Alcohol use 2.4 oz/week    4 Cans of beer per week    Review of Systems  Constitutional: As above Eyes: No visual changes.  ENT: No neck stiffness Cardiovascular: Denies chest pain. Respiratory: Positive cough Gastrointestinal: No abdominal pain.  No nausea, no vomiting.   Genitourinary: Negative for dysuria. Musculoskeletal: Negative for back pain. Skin: Negative for rash. Neurological: No focal weakness   10-point ROS otherwise negative.  ____________________________________________   PHYSICAL EXAM:  VITAL SIGNS: ED Triage Vitals  Enc Vitals Group     BP 02/24/16 1500 94/62     Pulse Rate 02/24/16 1500 93     Resp --      Temp 02/24/16 1500 98.8 F (37.1 C)     Temp src --      SpO2 02/24/16 1500 100 %     Weight 02/24/16 1441 250 lb (113.4 kg)     Height 02/24/16 1441 5' 8" (1.727 m)     Head Circumference --      Peak Flow --      Pain Score 02/24/16 1441 9     Pain Loc --      Pain Edu? --      Excl. in [place]? --     Constitutional: Alert and oriented. No acute distress.  Eyes: Conjunctivae are normal. Her Loc, EOMI Head: Evidence of right craniotomy, no erythema or swelling or discharge Nose: No congestion/rhinnorhea. Mouth/Throat: Mucous  membranes are moist.   Neck:  Painless ROM, no meningismus Cardiovascular: Normal rate, regular rhythm. Grossly normal heart sounds.  Good peripheral circulation. Respiratory: Normal respiratory effort.  No retractions. Lungs CTAB. Gastrointestinal: Soft and nontender. No distention.  No CVA tenderness. Genitourinary: deferred Musculoskeletal: No lower extremity tenderness nor edema.  Warm and well perfused Neurologic:  Normal speech and language. No gross focal neurologic deficits are appreciated.  Skin:  Skin is warm, dry and intact. No rash noted. Psychiatric: Mood and affect are normal. Speech and behavior are normal.  ____________________________________________   LABS (all labs ordered are listed, but only abnormal results are displayed)  Labs Reviewed  CBC WITH DIFFERENTIAL/PLATELET - Abnormal; Notable for the following:       Result Value   RBC 3.68 (*)    Hemoglobin 8.2 (*)    HCT 26.3 (*)    MCV 71.5 (*)    MCH 22.3 (*)    MCHC 31.2 (*)    RDW 20.7 (*)    Platelets 149 (*)    All other components within normal limits  COMPREHENSIVE METABOLIC PANEL - Abnormal; Notable for the following:    Potassium 3.4 (*)    BUN <5 (*)    Calcium 8.7 (*)    Total Protein 8.6 (*)    Albumin 3.2 (*)    AST 61 (*)    Alkaline Phosphatase 214 (*)    All other components within normal limits  URINALYSIS COMPLETEWITH MICROSCOPIC ([facility] ONLY) - Abnormal; Notable for the following:    Color, Urine YELLOW (*)    APPearance CLEAR (*)    Specific Gravity, Urine 1.003 (*)    Leukocytes, UA TRACE (*)    Bacteria, UA RARE (*)    All other components  within normal limits  CULTURE, BLOOD (ROUTINE X 2)  CULTURE, BLOOD (ROUTINE X 2)  URINE CULTURE  LACTIC ACID, PLASMA  LACTIC ACID, PLASMA   ____________________________________________  EKG  None ____________________________________________  RADIOLOGY  CT  unremarkable ____________________________________________   PROCEDURES  Procedure(s) performed: No    Critical Care performed: No ____________________________________________   INITIAL IMPRESSION / ASSESSMENT AND PLAN / ED COURSE  Pertinent labs & imaging results that were available during my care of the patient were reviewed by me and considered in my medical decision making (see chart for details).  Patient sent in by ID with recommendation for admission. We will obtain labs, lactic acid, CT head and discussed with ID. Patient was seen here yesterday but apparently left prior to being seen, labs from yesterday are reassuring.  Clinical Course  Discussed with Dr. Graylon Good, patient's infectious disease physician. She recommends admission for observation given hx of osteomyelitis ____________________________________________   FINAL CLINICAL IMPRESSION(S) / ED DIAGNOSES  Final diagnoses:  Intractable headache, unspecified chronicity pattern, unspecified headache type      NEW MEDICATIONS STARTED DURING THIS VISIT:  New Prescriptions   No medications on file     Note:  This document was prepared using Dragon voice recognition software and may include unintentional dictation errors.    Lavonia Drafts, MD 02/24/16 [phone]

## 2016-02-24 NOTE — ED Notes (Signed)
Dr. Graylon Good [phone]  Neurosurgical flap infection

## 2016-02-24 NOTE — ED Notes (Signed)
Patient transported to CT 

## 2016-02-24 NOTE — Care Management Obs Status (Signed)
[facility] NOTIFICATION   Patient Details  Name: [name] MRN: [id number] Date of Birth: 1955-10-01   Medicare Observation Status Notification Given:  Yes (headache)    Ival Bible, RN 02/24/2016, 6:30 PM

## 2016-02-24 NOTE — ED Notes (Signed)
Called pharmacy to request NaCl with K+

## 2016-02-24 NOTE — H&P (Signed)
History and Physical    [name] [id number] DOB: 1955/11/24 DOA: 02/24/2016  Referring physician: Dr. Corky Downs PCP: Pcp Not In System  Specialists: none  Chief Complaint: Headache  HPI: [name] is a 60 y.o. female has a past medical history significant for multiple medical issues including previous meningioma removal complicated by seizures and osteomyelitis of the skull. Also has hx of chronic pain and narcotic abuse, cirrhosis, HTN, and DM. Has a PICC line but has been non-compliant with hwer home ABX. Presents now with right-sided HA and tenderness to palpation of right skull. States she has had fever at home to 103 but is afebrile here. Lactic acid and WBC normal. CT of head stable. She continues to c/o HA and is now admitted. Also with diffuse abdominal pain and cough.  Review of Systems: The patient denies anorexia, weight loss,, vision loss, decreased hearing, hoarseness, chest pain, syncope,  peripheral edema, balance deficits, hemoptysis, melena, hematochezia, severe indigestion/heartburn, hematuria, incontinence, genital sores, muscle weakness, suspicious skin lesions, transient blindness, difficulty walking, depression, unusual weight change, abnormal bleeding, enlarged lymph nodes, angioedema, and breast masses.   Past Medical History:  Diagnosis Date  . Allergy   . Chronic bronchitis ([place])   . Chronic kidney disease   . Cirrhosis ([place])   . Depression   . Diabetes mellitus without complication ([place])   . Diverticulitis   . Emphysema of lung ([place])   . GERD (gastroesophageal reflux disease)   . H/O drug abuse    Clean since 2009  . H/O ETOH abuse    Sober since 2009  . Hepatitis C   . Hypertension   . Malignant brain tumor ([place]) 2007  . Migraines   . Pancreatic ascites   . Pancreatitis, alcoholic 1245  . Rectovaginal fistula   . Seizures ([place])   . Stroke [facility]) 2007   during brain surgery  . Urine incontinence    Past Surgical History:  Procedure  Laterality Date  . ABDOMINAL HYSTERECTOMY  1979  . APPENDECTOMY  1999  . BRAIN SURGERY  2007   malignant brain tumor  . CHOLECYSTECTOMY  2001  . CRANIOPLASTY N/A 09/15/2015   Procedure: Cranioplasty with retrieval of bone flap from abdominal pocket;  Surgeon: Ashok Pall, MD;  Location: [place] NEUR[facility] ORS;  Service: Neurosurgery;  Laterality: N/A;  Cranioplasty with retrieval of bone flap from abdominal pocket  . CRANIOTOMY Right 08/16/2015   Procedure: Right Fronto-Temporal-Parietal Craniotomy for Evacuation of Hematoma with Bone Flap Placement in Abdomen;  Surgeon: Ashok Pall, MD;  Location: [facility];  Service: Neurosurgery;  Laterality: Right;  . CRANIOTOMY N/A 12/28/2015   Procedure: Craniectomy for wound debridement;  Surgeon: Ashok Pall, MD;  Location: [place] NEUR[facility] ORS;  Service: Neurosurgery;  Laterality: N/A;  Craniectomy for wound debridement  . EYE SURGERY    . rectovaginal fistula repair w/ colostomy  2011   Social History:  reports that she has quit smoking. Her smoking use included Cigarettes. She smoked 0.50 packs per day. She has never used smokeless tobacco. She reports that she drinks about 2.4 oz of alcohol per week . She reports that she does not use drugs.  Allergies  Allergen Reactions  . Ibuprofen Other (See Comments)    Pt states that she is unable to take this medication because of her liver.    . Sulfa Antibiotics Hives  . Hydroxyzine Other (See Comments)    Causes crying   . Amoxicillin Itching, Rash and Other (See Comments)  Has patient had a PCN reaction causing immediate rash, facial/tongue/throat swelling, SOB or lightheadedness with hypotension: No Has patient had a PCN reaction causing severe rash involving mucus membranes or skin necrosis: No Has patient had a PCN reaction that required hospitalization No Has patient had a PCN reaction occurring within the last 10 years: Yes If all of the above answers are "NO", then may proceed with Cephalosporin use.  .  Chocolate Rash  . Ciprofloxacin Itching and Rash  . Dilaudid [Hydromorphone Hcl] Itching  . Fentanyl Rash  . Hydromorphone Itching  . Metformin Nausea And Vomiting  . Penicillins Itching, Rash and Other (See Comments)    Has patient had a PCN reaction causing immediate rash, facial/tongue/throat swelling, SOB or lightheadedness with hypotension: No Has patient had a PCN reaction causing severe rash involving mucus membranes or skin necrosis: No Has patient had a PCN reaction that required hospitalization No Has patient had a PCN reaction occurring within the last 10 years: Yes If all of the above answers are "NO", then may proceed with Cephalosporin use.  . Strawberry Extract Rash  . Zofran [Ondansetron Hcl] Itching and Nausea And Vomiting    Family History  Problem Relation Age of Onset  . Hypertension Mother   . Heart disease Father   . Diabetes Father   . Cancer Sister     brain  . Cancer Grandchild 8    brain tumor    Prior to Admission medications   Medication Sig Start Date End Date Taking? Authorizing Provider  albuterol (PROVENTIL HFA;VENTOLIN HFA) 108 (90 Base) MCG/ACT inhaler Inhale 2 puffs into the lungs every 6 (six) hours as needed for wheezing or shortness of breath.    Historical Provider, MD  albuterol (PROVENTIL HFA;VENTOLIN HFA) 108 (90 Base) MCG/ACT inhaler Inhale into the lungs.    Historical Provider, MD  albuterol (PROVENTIL) (5 MG/ML) 0.5% nebulizer solution Take 2.5 mg by nebulization every 6 (six) hours as needed for wheezing or shortness of breath.     Historical Provider, MD  albuterol (PROVENTIL) (5 MG/ML) 0.5% nebulizer solution 2.5 mg. 06/16/15   Historical Provider, MD  ALPRAZolam Duanne Moron) 1 MG tablet Take 1 tablet (1 mg total) by mouth 2 (two) times daily as needed for anxiety. 01/05/16   Ashok Pall, MD  amitriptyline (ELAVIL) 50 MG tablet Take 1 tablet (50 mg total) by mouth at bedtime. 12/01/12   Jackolyn Confer, MD  amitriptyline (ELAVIL) 50 MG  tablet Take 50 mg by mouth. 12/11/15   Historical Provider, MD  amLODipine (NORVASC) 10 MG tablet Take 10 mg by mouth daily.    Historical Provider, MD  Blood Glucose Monitoring Suppl (FIFTY50 GLUCOSE METER 2.0) w/Device KIT ICD-10 E11.9 Check blood sugars once daily 09/04/15   Historical Provider, MD  cephALEXin (KEFLEX) 500 MG capsule take 1 capsule by mouth three times a day 12/12/15   Historical Provider, MD  ciprofloxacin (CIPRO) 500 MG tablet Take 500 mg by mouth 2 (two) times daily. for 10 days 11/11/15   Historical Provider, MD  diphenhydrAMINE (BENADRYL) 25 mg capsule Take 1 capsule (25 mg total) by mouth every 8 (eight) hours as needed for itching. 10/25/15   Vaughan Basta, MD  diphenoxylate-atropine (LOMOTIL) 2.5-0.025 MG tablet Take 1 tablet by mouth 3 (three) times daily. 12/25/15   Historical Provider, MD  ENSURE (ENSURE) Take by mouth. 11/10/15   Historical Provider, MD  Fluticasone-Salmeterol (ADVAIR DISKUS) 500-50 MCG/DOSE AEPB Inhale into the lungs. 05/11/15   Historical Provider, MD  Fluticasone-Salmeterol (  ADVAIR) 250-50 MCG/DOSE AEPB Inhale 2 puffs into the lungs every 6 (six) hours as needed (for wheezing or shortness of breath).     Historical Provider, MD  furosemide (LASIX) 20 MG tablet Take 20 mg by mouth daily.    Historical Provider, MD  gabapentin (NEURONTIN) 300 MG capsule Take 600 mg by mouth at bedtime.     Historical Provider, MD  gabapentin (NEURONTIN) 300 MG capsule Take 600 mg by mouth. 12/11/15   Historical Provider, MD  hydrOXYzine (ATARAX/VISTARIL) 25 MG tablet TAKE 1 TABLET BY MOUTH  EVERY SIX HOURS AS NEEDED 11/28/15   Historical Provider, MD  Ipratropium-Albuterol (COMBIVENT) 20-100 MCG/ACT AERS respimat Inhale 2 puffs into the lungs every 6 (six) hours as needed for wheezing or shortness of breath.     Historical Provider, MD  levETIRAcetam (KEPPRA) 500 MG tablet Take 500 mg by mouth. 10/18/15   Historical Provider, MD  levETIRAcetam (KEPPRA) 750 MG tablet Take 750  mg by mouth 2 (two) times daily.    Historical Provider, MD  lipase/protease/amylase (CREON) 12000 units CPEP capsule Take 24,000 Units by mouth 3 (three) times daily with meals.    Historical Provider, MD  lipase/protease/amylase (CREON) 12000 units CPEP capsule Take by mouth. 02/28/15   Historical Provider, MD  nicotine (NICODERM CQ - DOSED IN MG/24 HR) 7 mg/24hr patch Place onto the skin. 07/30/15   Historical Provider, MD  nitrofurantoin, macrocrystal-monohydrate, (MACROBID) 100 MG capsule take 1 capsule by mouth twice a day for 7 days 12/05/15   Historical Provider, MD  nystatin ointment (MYCOSTATIN) apply to affected area twice a day 01/10/16   Historical Provider, MD  omeprazole (PRILOSEC) 20 MG capsule Take 20 mg by mouth. 11/29/15   Historical Provider, MD  omeprazole (PRILOSEC) 40 MG capsule Take 40 mg by mouth daily. Reported on 01/20/2016    Historical Provider, MD  Oxycodone HCl 20 MG TABS Take 1 tablet (20 mg total) by mouth every 12 (twelve) hours as needed. Patient taking differently: Take 1 tablet by mouth 3 (three) times daily as needed (for pain).  11/16/15   Rubie Maid, MD  Oxycodone HCl 20 MG TABS Take 20 mg by mouth. 10/18/15   Historical Provider, MD  phenytoin (DILANTIN) 100 MG/4ML suspension Take 300 mg by mouth 3 (three) times daily.    Historical Provider, MD  pravastatin (PRAVACHOL) 10 MG tablet Take 10 mg by mouth daily.     Historical Provider, MD  pravastatin (PRAVACHOL) 10 MG tablet Take 10 mg by mouth. 05/11/15   Historical Provider, MD  promethazine (PHENERGAN) 25 MG tablet Take 25 mg by mouth every 8 (eight) hours as needed for nausea or vomiting.    Historical Provider, MD  promethazine (PHENERGAN) 25 MG tablet Take 25 mg by mouth. 10/18/15   Historical Provider, MD  sertraline (ZOLOFT) 100 MG tablet Take 200 mg by mouth. 08/10/15   Historical Provider, MD  spironolactone (ALDACTONE) 50 MG tablet Take 1 tablet (50 mg total) by mouth 2 (two) times daily. 10/25/15    Vaughan Basta, MD  spironolactone (ALDACTONE) 50 MG tablet Take 50 mg by mouth. 10/18/15   Historical Provider, MD  sucralfate (CARAFATE) 1 g tablet Take 1 tablet by mouth 4  times daily 10/23/15   Historical Provider, MD  terconazole (TERAZOL 7) 0.4 % vaginal cream Place 1 applicator vaginally at bedtime. x7day 02/07/16   Rubie Maid, MD  tiotropium (SPIRIVA) 18 MCG inhalation capsule Place 18 mcg into inhaler and inhale daily as needed (shortness of  breath).     Historical Provider, MD  tiotropium (SPIRIVA) 18 MCG inhalation capsule Place into inhaler and inhale. 05/05/15 05/04/16  Historical Provider, MD  traMADol (ULTRAM) 50 MG tablet Take by mouth. 08/10/14   Historical Provider, MD  triamcinolone (NASACORT ALLERGY 24HR) 55 MCG/ACT AERO nasal inhaler Place 2 sprays into the nose daily as needed (for rhinitis).    Historical Provider, MD  triamcinolone cream (KENALOG) 0.1 % Apply 1 application topically 2 (two) times daily. 02/07/16   Hildred Laser, MD  zolpidem (AMBIEN) 10 MG tablet Take 1 tablet (10 mg total) by mouth at bedtime as needed for sleep. 01/05/16 02/04/16  Coletta Memos, MD  zolpidem (AMBIEN) 10 MG tablet Take 10 mg by mouth. 06/16/15   Historical Provider, MD   Physical Exam: Vitals:   02/24/16 1441 02/24/16 1500 02/24/16 1530 02/24/16 1739  BP:  94/62 96/73 106/62  Pulse:  93 97 95  Resp:    18  Temp:  98.8 F (37.1 C)  98.5 F (36.9 C)  TempSrc:    Oral  SpO2:  100% 99% 97%  Weight: 113.4 kg (250 lb)     Height: 5\' 8"  (1.727 m)        General:  No apparent distress, obese, skull deformed from previous surgeries  Eyes: PERRL, EOMI, no scleral icterus, conjunctiva clear  ENT: moist oropharynx without exudate, TM's benign, dentition fair  Neck: supple, no lymphadenopathy. No bruits or thyromegaly  Cardiovascular: regular rate without MRG; 2+ peripheral pulses, no JVD, no peripheral edema  Respiratory: CTA biL, good air movement without wheezing, rhonchi or  crackled, respiratory effort normal  Abdomen: soft, diffusely tender to palpation, positive bowel sounds, no guarding, no rebound  Skin: no rashes or lesions. Right scalp tender to palpation  Musculoskeletal: normal bulk and tone, no joint swelling  Psychiatric: normal mood and affect, A&OX3  Neurologic: CN 2-12 grossly intact, Motor strength 5/5 in all 4 groups with symmetric DTR's and non-focal sensory exam  Labs on Admission:  Basic Metabolic Panel:  Recent Labs Lab 02/23/16 1606 02/24/16 1556  NA 141 139  K 3.4* 3.4*  CL 113* 108  CO2 17* 22  GLUCOSE 111* 78  BUN <5* <5*  CREATININE 0.74 0.85  CALCIUM 8.9 8.7*   Liver Function Tests:  Recent Labs Lab 02/23/16 1606 02/24/16 1556  AST 69* 61*  ALT 31 28  ALKPHOS 211* 214*  BILITOT 0.9 0.8  PROT 8.6* 8.6*  ALBUMIN 3.4* 3.2*   No results for input(s): LIPASE, AMYLASE in the last 168 hours. No results for input(s): AMMONIA in the last 168 hours. CBC:  Recent Labs Lab 02/23/16 1606 02/24/16 1556  WBC 4.9 4.6  NEUTROABS 2.8 2.5  HGB 8.4* 8.2*  HCT 27.6* 26.3*  MCV 73.8* 71.5*  PLT 160 149*   Cardiac Enzymes: No results for input(s): CKTOTAL, CKMB, CKMBINDEX, TROPONINI in the last 168 hours.  BNP (last 3 results)  Recent Labs  01/17/16 1432  BNP 10.0    ProBNP (last 3 results) No results for input(s): PROBNP in the last 8760 hours.  CBG: No results for input(s): GLUCAP in the last 168 hours.  Radiological Exams on Admission: Ct Head Wo Contrast  Result Date: 02/24/2016 CLINICAL DATA:  Recent cranial flap infection. Headaches. Nose bleeds. Nausea and vomiting. EXAM: CT HEAD WITHOUT CONTRAST TECHNIQUE: Contiguous axial images were obtained from the base of the skull through the vertex without intravenous contrast. COMPARISON:  01/17/2016 FINDINGS: Previous bilateral craniotomies. Craniectomy on the  right. Soft tissues of the scalp, residual bony calvarium and right-sided cranioplasty do not show  any acute or worrisome finding. No sign of soft tissue infection by imaging. The right cerebral hemisphere appears unremarkable. Left cerebral hemisphere shows atrophy and encephalomalacia in the inferior left frontal lobe, insula and temporal tip as seen previously. No sign of recent infarction, mass lesion, hemorrhage, hydrocephalus or extra-axial collection. Sinuses, middle ears and mastoids are clear. IMPRESSION: No acute finding affecting the brain. Chronic atrophy encephalomalacia affecting the left frontal lobe, insular region and temporal tip as seen previously. Previous bilateral craniotomy and right-sided cranioplasty. No imaging finding to suggest new or ongoing complication. Electronically Signed   By: Nelson Chimes M.D.   On: 02/24/2016 16:15   Dg Chest Portable 1 View  Result Date: 02/24/2016 CLINICAL DATA:  Recent neurological infection. Nosebleeds. Nausea, vomiting and headaches. EXAM: PORTABLE CHEST 1 VIEW COMPARISON:  02/02/2016 FINDINGS: Right arm PICC is in place with the tip in the SVC at the azygos level. Previously this was at the [facility] RA junction. The lungs are clear. The vascularity is normal. No effusions. No bony abnormalities. IMPRESSION: No active cardiopulmonary disease. Right arm PICC tip in the SVC at the azygos level, previously seen at the SVC RA junction. Electronically Signed   By: Nelson Chimes M.D.   On: 02/24/2016 16:11    EKG: Independently reviewed.  Assessment/Plan Principal Problem:   Acute intractable headache Active Problems:   HTN (hypertension)   Diabetes mellitus, type 2 (HCC)   Osteomyelitis of skull ([place])   Will observe on floor with IV fluids and IV ABX. Cultures sent. Obtain MRI of brain and Neuro consult. Follow sugars. Continue outpatient meds for now. Repeat labs in AM.  Diet: clear liquids Fluids: NS with K+_0  DVT Prophylaxis: SQ Heparin  Code Status: FULL  Family Communication: none  Disposition Plan: home  Time spent: 50 min

## 2016-02-25 DIAGNOSIS — R51 Headache: Secondary | ICD-10-CM

## 2016-02-25 LAB — CBC
HCT: 24.7 % — ABNORMAL LOW (ref 35.0–47.0)
HEMOGLOBIN: 7.5 g/dL — AB (ref 12.0–16.0)
MCH: 22 pg — AB (ref 26.0–34.0)
MCHC: 30.5 g/dL — AB (ref 32.0–36.0)
MCV: 72.3 fL — ABNORMAL LOW (ref 80.0–100.0)
Platelets: 126 10*3/uL — ABNORMAL LOW (ref 150–440)
RBC: 3.42 MIL/uL — ABNORMAL LOW (ref 3.80–5.20)
RDW: 20.2 % — AB (ref 11.5–14.5)
WBC: 4.1 10*3/uL (ref 3.6–11.0)

## 2016-02-25 LAB — COMPREHENSIVE METABOLIC PANEL
ALBUMIN: 2.9 g/dL — AB (ref 3.5–5.0)
ALK PHOS: 208 U/L — AB (ref 38–126)
ALT: 27 U/L (ref 14–54)
ANION GAP: 4 — AB (ref 5–15)
AST: 59 U/L — AB (ref 15–41)
BILIRUBIN TOTAL: 1 mg/dL (ref 0.3–1.2)
BUN: 6 mg/dL (ref 6–20)
CALCIUM: 8 mg/dL — AB (ref 8.9–10.3)
CO2: 23 mmol/L (ref 22–32)
CREATININE: 0.78 mg/dL (ref 0.44–1.00)
Chloride: 112 mmol/L — ABNORMAL HIGH (ref 101–111)
GFR calc Af Amer: >60 mL/min (ref >60)
GFR calc non Af Amer: >60 mL/min (ref >60)
GLUCOSE: 97 mg/dL (ref 65–99)
Potassium: 3.5 mmol/L (ref 3.5–5.1)
SODIUM: 139 mmol/L (ref 135–145)
TOTAL PROTEIN: 7.7 g/dL (ref 6.5–8.1)

## 2016-02-25 LAB — BLOOD CULTURE ID PANEL (REFLEXED)
ACINETOBACTER BAUMANNII: NOT DETECTED
CANDIDA KRUSEI: NOT DETECTED
CANDIDA PARAPSILOSIS: DETECTED — AB
CARBAPENEM RESISTANCE: NOT DETECTED
Candida albicans: NOT DETECTED
Candida glabrata: NOT DETECTED
Candida tropicalis: NOT DETECTED
ENTEROBACTERIACEAE SPECIES: NOT DETECTED
ENTEROCOCCUS SPECIES: NOT DETECTED
Enterobacter cloacae complex: NOT DETECTED
Escherichia coli: NOT DETECTED
Haemophilus influenzae: NOT DETECTED
KLEBSIELLA OXYTOCA: NOT DETECTED
KLEBSIELLA PNEUMONIAE: NOT DETECTED
LISTERIA MONOCYTOGENES: NOT DETECTED
Methicillin resistance: NOT DETECTED
Neisseria meningitidis: NOT DETECTED
PSEUDOMONAS AERUGINOSA: NOT DETECTED
Proteus species: NOT DETECTED
SERRATIA MARCESCENS: NOT DETECTED
STAPHYLOCOCCUS AUREUS BCID: NOT DETECTED
STAPHYLOCOCCUS SPECIES: NOT DETECTED
STREPTOCOCCUS AGALACTIAE: NOT DETECTED
Streptococcus pneumoniae: NOT DETECTED
Streptococcus pyogenes: NOT DETECTED
Streptococcus species: NOT DETECTED
VANCOMYCIN RESISTANCE: NOT DETECTED

## 2016-02-25 LAB — GLUCOSE, CAPILLARY
GLUCOSE-CAPILLARY: 108 mg/dL — AB (ref 65–99)
GLUCOSE-CAPILLARY: 90 mg/dL (ref 65–99)
GLUCOSE-CAPILLARY: 121 mg/dL — AB (ref 65–99)
Glucose-Capillary: 140 mg/dL — ABNORMAL HIGH (ref 65–99)

## 2016-02-25 MED ORDER — FLUCONAZOLE IN SODIUM CHLORIDE 400-0.9 MG/200ML-% IV SOLN
400.0000 mg | INTRAVENOUS | Status: DC
  Filled 2016-02-25: qty 200

## 2016-02-25 MED ORDER — IPRATROPIUM-ALBUTEROL 0.5-2.5 (3) MG/3ML IN SOLN
3.0000 mL | Freq: Three times a day (TID) | RESPIRATORY_TRACT | Status: DC
  Administered 2016-02-25 – 2016-02-27 (×6): 3 mL via RESPIRATORY_TRACT
  Filled 2016-02-25 (×6): qty 3

## 2016-02-25 MED ORDER — FLUCONAZOLE IN SODIUM CHLORIDE 400-0.9 MG/200ML-% IV SOLN
800.0000 mg | Freq: Once | INTRAVENOUS | Status: AC
  Administered 2016-02-25: 23:00:00 800 mg via INTRAVENOUS
  Filled 2016-02-25: qty 400

## 2016-02-25 NOTE — Consult Note (Signed)
Reason for Consult: headache Referring Physician: Dr. Doy Hutching  CC: headache  HPI: [name] is an 60 y.o. female  has a past medical history significant previous meningioma removal complicated by seizures and osteomyelitis of the skull. Also has hx of chronic pain and narcotic abuse, cirrhosis, HTN, and DM. Has a PICC line but has been non-compliant with hwer home ABX. Presents now with right-sided HA and tenderness to palpation of right skull. States she has had fever at home to 103 but is afebrile here. Lactic acid and WBC normal. CT of head stable.  CTH reviewed chronic changes.  R sided HA as she describes daily throbbing.  States on oxycodone .   Past Medical History:  Diagnosis Date  . Allergy   . Chronic bronchitis ([place])   . Chronic kidney disease   . Cirrhosis ([place])   . Depression   . Diabetes mellitus without complication ([place])   . Diverticulitis   . Emphysema of lung ([place])   . GERD (gastroesophageal reflux disease)   . H/O drug abuse    Clean since 2009  . H/O ETOH abuse    Sober since 2009  . Hepatitis C   . Hypertension   . Malignant brain tumor ([place]) 2007  . Migraines   . Pancreatic ascites   . Pancreatitis, alcoholic 0000000  . Rectovaginal fistula   . Seizures ([place])   . Stroke [facility]) 2007   during brain surgery  . Urine incontinence     Past Surgical History:  Procedure Laterality Date  . ABDOMINAL HYSTERECTOMY  1979  . APPENDECTOMY  1999  . BRAIN SURGERY  2007   malignant brain tumor  . CHOLECYSTECTOMY  2001  . CRANIOPLASTY N/A 09/15/2015   Procedure: Cranioplasty with retrieval of bone flap from abdominal pocket;  Surgeon: Ashok Pall, MD;  Location: [place] NEURO ORS;  Service: Neurosurgery;  Laterality: N/A;  Cranioplasty with retrieval of bone flap from abdominal pocket  . CRANIOTOMY Right 08/16/2015   Procedure: Right Fronto-Temporal-Parietal Craniotomy for Evacuation of Hematoma with Bone Flap Placement in Abdomen;  Surgeon: Ashok Pall, MD;  Location:  [place] NEURO ORS;  Service: Neurosurgery;  Laterality: Right;  . CRANIOTOMY N/A 12/28/2015   Procedure: Craniectomy for wound debridement;  Surgeon: Ashok Pall, MD;  Location: [place] NEURO ORS;  Service: Neurosurgery;  Laterality: N/A;  Craniectomy for wound debridement  . EYE SURGERY    . rectovaginal fistula repair w/ colostomy  2011    Family History  Problem Relation Age of Onset  . Hypertension Mother   . Heart disease Father   . Diabetes Father   . Cancer Sister     brain  . Cancer Grandchild 8    brain tumor    Social History:  reports that she has quit smoking. Her smoking use included Cigarettes. She smoked 0.50 packs per day. She has never used smokeless tobacco. She reports that she drinks about 2.4 oz of alcohol per week . She reports that she does not use drugs.  Allergies  Allergen Reactions  . Ibuprofen Other (See Comments)    Pt states that she is unable to take this medication because of her liver.    . Sulfa Antibiotics Hives  . Hydroxyzine Other (See Comments)    Causes crying   . Amoxicillin Itching, Rash and Other (See Comments)    Has patient had a PCN reaction causing immediate rash, facial/tongue/throat swelling, SOB or lightheadedness with hypotension: No Has patient had a PCN reaction causing severe rash  involving mucus membranes or skin necrosis: No Has patient had a PCN reaction that required hospitalization No Has patient had a PCN reaction occurring within the last 10 years: Yes If all of the above answers are "NO", then may proceed with Cephalosporin use.  . Chocolate Rash  . Ciprofloxacin Itching and Rash  . Dilaudid [Hydromorphone Hcl] Itching  . Fentanyl Rash  . Hydromorphone Itching  . Metformin Nausea And Vomiting  . Penicillins Itching, Rash and Other (See Comments)    Has patient had a PCN reaction causing immediate rash, facial/tongue/throat swelling, SOB or lightheadedness with hypotension: No Has patient had a PCN reaction causing severe  rash involving mucus membranes or skin necrosis: No Has patient had a PCN reaction that required hospitalization No Has patient had a PCN reaction occurring within the last 10 years: Yes If all of the above answers are "NO", then may proceed with Cephalosporin use.  . Strawberry Extract Rash  . Zofran [Ondansetron Hcl] Itching and Nausea And Vomiting    Medications: I have reviewed the patient's current medications.  ROS: History obtained from the patient  General ROS: negative for - chills, fatigue, fever, night sweats, weight gain or weight loss Psychological ROS: negative for - behavioral disorder, hallucinations, memory difficulties, mood swings or suicidal ideation Ophthalmic ROS: negative for - blurry vision, double vision, eye pain or loss of vision ENT ROS: negative for - epistaxis, nasal discharge, oral lesions, sore throat, tinnitus or vertigo Allergy and Immunology ROS: negative for - hives or itchy/watery eyes Hematological and Lymphatic ROS: negative for - bleeding problems, bruising or swollen lymph nodes Endocrine ROS: negative for - galactorrhea, hair pattern changes, polydipsia/polyuria or temperature intolerance Respiratory ROS: negative for - cough, hemoptysis, shortness of breath or wheezing Cardiovascular ROS: negative for - chest pain, dyspnea on exertion, edema or irregular heartbeat Gastrointestinal ROS: negative for - abdominal pain, diarrhea, hematemesis, nausea/vomiting or stool incontinence Genito-Urinary ROS: negative for - dysuria, hematuria, incontinence or urinary frequency/urgency Musculoskeletal ROS: negative for - joint swelling or muscular weakness Neurological ROS: as noted in HPI Dermatological ROS: negative for rash and skin lesion changes  Physical Examination: Blood pressure 107/65, pulse (!) 105, temperature 98.7 F (37.1 C), temperature source Oral, resp. rate 20, height 5\' 8"  (1.727 m), weight 115.4 kg (254 lb 6.4 oz), SpO2 100  %.   Neurological Examination Mental Status: Alert, oriented, thought content appropriate.  Speech fluent without evidence of aphasia.  Able to follow 3 step commands without difficulty. Cranial Nerves: II: Discs flat bilaterally; Visual fields grossly normal, pupils equal, round, reactive to light and accommodation III,IV, VI: ptosis not present, extra-ocular motions intact bilaterally V,VII: smile symmetric, facial light touch sensation normal bilaterally VIII: hearing normal bilaterally IX,X: gag reflex present XI: bilateral shoulder shrug XII: midline tongue extension Motor: Right : Upper extremity   5/5    Left:     Upper extremity   5/5  Lower extremity   5/5     Lower extremity   5/5 Tone and bulk:normal tone throughout; no atrophy noted Sensory: Pinprick and light touch intact throughout, bilaterally Deep Tendon Reflexes: 1+ and symmetric throughout Plantars: Right: downgoing   Left: downgoing Cerebellar: normal finger-to-nose, normal rapid alternating movements and normal heel-to-shin test Gait: not tested       Laboratory Studies:   Basic Metabolic Panel:  Recent Labs Lab 02/23/16 1606 02/24/16 1556 02/25/16 0400  NA 141 139 139  K 3.4* 3.4* 3.5  CL 113* 108 112*  CO2 17*  22 23  GLUCOSE 111* 78 97  BUN <5* <5* 6  CREATININE 0.74 0.85 0.78  CALCIUM 8.9 8.7* 8.0*    Liver Function Tests:  Recent Labs Lab 02/23/16 1606 02/24/16 1556 02/25/16 0400  AST 69* 61* 59*  ALT 31 28 27   ALKPHOS 211* 214* 208*  BILITOT 0.9 0.8 1.0  PROT 8.6* 8.6* 7.7  ALBUMIN 3.4* 3.2* 2.9*   No results for input(s): LIPASE, AMYLASE in the last 168 hours. No results for input(s): AMMONIA in the last 168 hours.  CBC:  Recent Labs Lab 02/23/16 1606 02/24/16 1556 02/25/16 0400  WBC 4.9 4.6 4.1  NEUTROABS 2.8 2.5  --   HGB 8.4* 8.2* 7.5*  HCT 27.6* 26.3* 24.7*  MCV 73.8* 71.5* 72.3*  PLT 160 149* 126*    Cardiac Enzymes: No results for input(s): CKTOTAL, CKMB,  CKMBINDEX, TROPONINI in the last 168 hours.  BNP: Invalid input(s): POCBNP  CBG:  Recent Labs Lab 02/24/16 2053 02/25/16 0722 02/25/16 1135  GLUCAP 84 108* 90    Microbiology: Results for orders placed or performed during the hospital encounter of 02/24/16  Blood culture (routine x 2)     Status: None (Preliminary result)   Collection Time: 02/24/16  3:56 PM  Result Value Ref Range Status   Specimen Description BLOOD RIGHT ARM  Final   Special Requests BOTTLES DRAWN AEROBIC AND ANAEROBIC  8CC  Final   Culture NO GROWTH < 24 HOURS  Final   Report Status PENDING  Incomplete  Blood culture (routine x 2)     Status: None (Preliminary result)   Collection Time: 02/24/16  4:54 PM  Result Value Ref Range Status   Specimen Description BLOOD RIGHT ASSIST CONTROL  Final   Special Requests BOTTLES DRAWN AEROBIC AND ANAEROBIC 1CC  Final   Culture NO GROWTH < 24 HOURS  Final   Report Status PENDING  Incomplete  MRSA PCR Screening     Status: None   Collection Time: 02/24/16  8:00 PM  Result Value Ref Range Status   MRSA by PCR NEGATIVE NEGATIVE Final    Comment:        The GeneXpert MRSA Assay (FDA approved for NASAL specimens only), is one component of a comprehensive MRSA colonization surveillance program. It is not intended to diagnose MRSA infection nor to guide or monitor treatment for MRSA infections.     Coagulation Studies: No results for input(s): LABPROT, INR in the last 72 hours.  Urinalysis:  Recent Labs Lab 02/24/16 1556  COLORURINE YELLOW*  LABSPEC 1.003*  PHURINE 6.0  GLUCOSEU NEGATIVE  HGBUR NEGATIVE  BILIRUBINUR NEGATIVE  KETONESUR NEGATIVE  PROTEINUR NEGATIVE  NITRITE NEGATIVE  LEUKOCYTESUR TRACE*    Lipid Panel:     Component Value Date/Time   CHOL 183 07/07/2012 0434   TRIG 76 07/07/2012 0434   HDL 36 (L) 07/07/2012 0434   VLDL 15 07/07/2012 0434   LDLCALC 132 (H) 07/07/2012 0434    HgbA1C:  Lab Results  Component Value Date    HGBA1C 5.5 05/14/2015    Urine Drug Screen:     Component Value Date/Time   LABOPIA POSITIVE (A) 05/15/2015 1822   LABOPIA NEG 11/03/2012 1116   COCAINSCRNUR NONE DETECTED 05/15/2015 1822   COCAINSCRNUR NEG 11/03/2012 1116   LABBENZ POSITIVE (A) 05/15/2015 1822   LABBENZ NEG 11/03/2012 1116   AMPHETMU NONE DETECTED 05/15/2015 1822   THCU NONE DETECTED 05/15/2015 1822   LABBARB NONE DETECTED 05/15/2015 1822    Alcohol Level:  No results for input(s): ETH in the last 168 hours.  Other results: EKG: normal EKG, normal sinus rhythm, unchanged from previous tracings.  Imaging: Ct Head Wo Contrast  Result Date: 02/24/2016 CLINICAL DATA:  Recent cranial flap infection. Headaches. Nose bleeds. Nausea and vomiting. EXAM: CT HEAD WITHOUT CONTRAST TECHNIQUE: Contiguous axial images were obtained from the base of the skull through the vertex without intravenous contrast. COMPARISON:  01/17/2016 FINDINGS: Previous bilateral craniotomies. Craniectomy on the right. Soft tissues of the scalp, residual bony calvarium and right-sided cranioplasty do not show any acute or worrisome finding. No sign of soft tissue infection by imaging. The right cerebral hemisphere appears unremarkable. Left cerebral hemisphere shows atrophy and encephalomalacia in the inferior left frontal lobe, insula and temporal tip as seen previously. No sign of recent infarction, mass lesion, hemorrhage, hydrocephalus or extra-axial collection. Sinuses, middle ears and mastoids are clear. IMPRESSION: No acute finding affecting the brain. Chronic atrophy encephalomalacia affecting the left frontal lobe, insular region and temporal tip as seen previously. Previous bilateral craniotomy and right-sided cranioplasty. No imaging finding to suggest new or ongoing complication. Electronically Signed   By: Nelson Chimes M.D.   On: 02/24/2016 16:15   Dg Chest Portable 1 View  Result Date: 02/24/2016 CLINICAL DATA:  Recent neurological infection.  Nosebleeds. Nausea, vomiting and headaches. EXAM: PORTABLE CHEST 1 VIEW COMPARISON:  02/02/2016 FINDINGS: Right arm PICC is in place with the tip in the SVC at the azygos level. Previously this was at the [facility] RA junction. The lungs are clear. The vascularity is normal. No effusions. No bony abnormalities. IMPRESSION: No active cardiopulmonary disease. Right arm PICC tip in the SVC at the azygos level, previously seen at the SVC RA junction. Electronically Signed   By: Nelson Chimes M.D.   On: 02/24/2016 16:11     Assessment/Plan: 60 y.o. female  has a past medical history significant previous meningioma removal complicated by seizures and osteomyelitis of the skull. Also has hx of chronic pain and narcotic abuse, cirrhosis, HTN, and DM. Has a PICC line but has been non-compliant with hwer home ABX. Presents now with right-sided HA and tenderness to palpation of right skull. States she has had fever at home to 103 but is afebrile here. Lactic acid and WBC normal. CT of head stable.  CTH reviewed chronic changes.  R sided HA as she describes daily throbbing.  States on oxycodone .  - Pt states she has severe R sided throbbing headache but is clinically appears to be very comfortable. She did ask for extra oxycodone as she is running out of hers.   - she was supposed to be on antibiotics which she apparently was not compliant with - H/H trending down as per primary team - She is ordered MRI. Not convinced she needs it thought as likely chronic headache - Mg (500-600mg ) and vitamin B complex daily as out pt.   Leotis Pain  02/25/2016, 3:19 PM

## 2016-02-25 NOTE — Progress Notes (Signed)
[facility] [place] NAME: [name]    MR#:  [id number]  DATE OF BIRTH:  [birth year]  SUBJECTIVE:  CHIEF COMPLAINT:   Chief Complaint  Patient presents with  . Other   - admitted with intractable headaches on right side - for MRi today - also has right sided open scalp defect from her brain surgery and had infection treated in craniotomy flap tissue with 6 weeks of vancomycin and invanz starting 01/05/16  REVIEW OF SYSTEMS:  Review of Systems  Constitutional: Positive for malaise/fatigue. Negative for chills and fever.  HENT: Negative for ear discharge and ear pain.   Eyes: Negative for blurred vision and double vision.  Respiratory: Negative for cough, shortness of breath and wheezing.   Cardiovascular: Negative for chest pain, palpitations and leg swelling.  Gastrointestinal: Positive for abdominal pain. Negative for constipation, diarrhea, nausea and vomiting.  Genitourinary: Negative for dysuria and urgency.  Musculoskeletal: Negative for myalgias.  Neurological: Positive for headaches. Negative for dizziness, sensory change, speech change, focal weakness and seizures.  Psychiatric/Behavioral: Negative for depression.    DRUG ALLERGIES:   Allergies  Allergen Reactions  . Ibuprofen Other (See Comments)    Pt states that she is unable to take this medication because of her liver.    . Sulfa Antibiotics Hives  . Hydroxyzine Other (See Comments)    Causes crying   . Amoxicillin Itching, Rash and Other (See Comments)    Has patient had a PCN reaction causing immediate rash, facial/tongue/throat swelling, SOB or lightheadedness with hypotension: No Has patient had a PCN reaction causing severe rash involving mucus membranes or skin necrosis: No Has patient had a PCN reaction that required hospitalization No Has patient had a PCN reaction occurring within the last 10 years: Yes If all of the above answers are "NO", then may  proceed with Cephalosporin use.  . Chocolate Rash  . Ciprofloxacin Itching and Rash  . Dilaudid [Hydromorphone Hcl] Itching  . Fentanyl Rash  . Hydromorphone Itching  . Metformin Nausea And Vomiting  . Penicillins Itching, Rash and Other (See Comments)    Has patient had a PCN reaction causing immediate rash, facial/tongue/throat swelling, SOB or lightheadedness with hypotension: No Has patient had a PCN reaction causing severe rash involving mucus membranes or skin necrosis: No Has patient had a PCN reaction that required hospitalization No Has patient had a PCN reaction occurring within the last 10 years: Yes If all of the above answers are "NO", then may proceed with Cephalosporin use.  . Strawberry Extract Rash  . Zofran [Ondansetron Hcl] Itching and Nausea And Vomiting    VITALS:  Blood pressure 107/69, pulse 97, temperature 98.4 F (36.9 C), temperature source Oral, resp. rate 20, height 5\' 8"  (1.727 m), weight 115.4 kg (254 lb 6.4 oz), SpO2 97 %.  PHYSICAL EXAMINATION:  Physical Exam  GENERAL:  60 y.o.-year-old obese patient lying in the bed with no acute distress.  EYES: Pupils equal, round, reactive to light and accommodation. No scleral icterus. Extraocular muscles intact.  HEENT: Head with right tempero-frontal occiput deficit,  normocephalic. Oropharynx and nasopharynx clear.  NECK:  Supple, no jugular venous distention. No thyroid enlargement, no tenderness.  LUNGS: Normal breath sounds bilaterally, no wheezing, rales,rhonchi or crepitation. No use of accessory muscles of respiration. Decreased bibasilar breath sounds. CARDIOVASCULAR: S1, S2 normal. No murmurs, rubs, or gallops.  ABDOMEN: Soft, obese, nontender, nondistended. Bowel sounds present. No organomegaly or mass.  EXTREMITIES: No  pedal edema, cyanosis, or clubbing.  NEUROLOGIC: Cranial nerves II through XII are intact. Muscle strength 5/5 in all extremities. Sensation intact. Gait not checked.  PSYCHIATRIC: The  patient is alert and oriented x 3.  SKIN: No obvious rash, lesion, or ulcer.    LABORATORY PANEL:   CBC  Recent Labs Lab 02/25/16 0400  WBC 4.1  HGB 7.5*  HCT 24.7*  PLT 126*   ------------------------------------------------------------------------------------------------------------------  Chemistries   Recent Labs Lab 02/25/16 0400  NA 139  K 3.5  CL 112*  CO2 23  GLUCOSE 97  BUN 6  CREATININE 0.78  CALCIUM 8.0*  AST 59*  ALT 27  ALKPHOS 208*  BILITOT 1.0   ------------------------------------------------------------------------------------------------------------------  Cardiac Enzymes No results for input(s): TROPONINI in the last 168 hours. ------------------------------------------------------------------------------------------------------------------  RADIOLOGY:  Ct Head Wo Contrast  Result Date: 02/24/2016 CLINICAL DATA:  Recent cranial flap infection. Headaches. Nose bleeds. Nausea and vomiting. EXAM: CT HEAD WITHOUT CONTRAST TECHNIQUE: Contiguous axial images were obtained from the base of the skull through the vertex without intravenous contrast. COMPARISON:  01/17/2016 FINDINGS: Previous bilateral craniotomies. Craniectomy on the right. Soft tissues of the scalp, residual bony calvarium and right-sided cranioplasty do not show any acute or worrisome finding. No sign of soft tissue infection by imaging. The right cerebral hemisphere appears unremarkable. Left cerebral hemisphere shows atrophy and encephalomalacia in the inferior left frontal lobe, insula and temporal tip as seen previously. No sign of recent infarction, mass lesion, hemorrhage, hydrocephalus or extra-axial collection. Sinuses, middle ears and mastoids are clear. IMPRESSION: No acute finding affecting the brain. Chronic atrophy encephalomalacia affecting the left frontal lobe, insular region and temporal tip as seen previously. Previous bilateral craniotomy and right-sided cranioplasty. No  imaging finding to suggest new or ongoing complication. Electronically Signed   By: Nelson Chimes M.D.   On: 02/24/2016 16:15   Dg Chest Portable 1 View  Result Date: 02/24/2016 CLINICAL DATA:  Recent neurological infection. Nosebleeds. Nausea, vomiting and headaches. EXAM: PORTABLE CHEST 1 VIEW COMPARISON:  02/02/2016 FINDINGS: Right arm PICC is in place with the tip in the SVC at the azygos level. Previously this was at the [facility] RA junction. The lungs are clear. The vascularity is normal. No effusions. No bony abnormalities. IMPRESSION: No active cardiopulmonary disease. Right arm PICC tip in the SVC at the azygos level, previously seen at the SVC RA junction. Electronically Signed   By: Nelson Chimes M.D.   On: 02/24/2016 16:11    EKG:   Orders placed or performed during the hospital encounter of 01/17/16  . ED EKG 12-Lead  . ED EKG 12-Lead  . EKG 12-Lead  . EKG 12-Lead  . EKG    ASSESSMENT AND PLAN:   60 year old obese female with complicated medical history including right hemispheric subdural hematoma evacuation with skull deficit and osteomyelitis of the skull finished 6 weeks of IV antibiotics about a month ago comes to the hospital with severe right-sided headaches.  #1 right-sided headaches-recent history of skull osteomyelitis removal of the craniotomy flap and 6 weeks of IV antibiotics at [facility]. -Neurology consulted. -MRI of the brain ordered. -Fevers at home. None here. Continue to follow up cultures. -On vancomycin and meropenem here.  #2 COPD-stable. Continue inhalers.  #3 seizure disorder-continue Dilantin and Keppra  #4 chronic pain issues-continue gabapentin. Also on tramadol when necessary.  #5 DVT prophylaxis-on subcutaneous heparin  #6 anemia of chronic disease-stable. No acute indication for transfusion at this time.   All the records  are reviewed and case discussed with Care Management/Social Workerr. Management plans discussed with the  patient, family and they are in agreement.  CODE STATUS: Full code  TOTAL TIME TAKING CARE OF THIS PATIENT: 38 minutes.   POSSIBLE D/C IN 2-3 DAYS, DEPENDING ON CLINICAL CONDITION.   Jammie Troup M.D on 02/25/2016 at 1:01 PM  Between 7am to 6pm - Pager - [phone]  After 6pm go to [email or web address] - password [email or web address] [facility]  Office  [phone]  CC: Primary care physician; Pcp Not In System

## 2016-02-25 NOTE — Progress Notes (Signed)
PHARMACY - PHYSICIAN COMMUNICATION CRITICAL VALUE ALERT - BLOOD CULTURE IDENTIFICATION (BCID)  Contacted by lab: blood culture growing Candida parapsiolosis in anaerobic bottle of 1 set.  Name of physician (or Provider) Contacted: Hugelmeyer  Changes to prescribed antibiotics required: Will order Fluconazole 800mg  IV once followed by Fluconazole 400mg  IV q24h.  Paulina Fusi, PharmD, BCPS 02/25/2016 9:42 PM

## 2016-02-26 ENCOUNTER — Inpatient Hospital Stay: Payer: Medicare Other

## 2016-02-26 ENCOUNTER — Observation Stay: Payer: Medicare Other

## 2016-02-26 ENCOUNTER — Inpatient Hospital Stay
Admit: 2016-02-26 | Discharge: 2016-02-26 | Disposition: A | Discharge status: Home / Self Care | Payer: Medicare Other | Attending: Infectious Diseases | Admitting: Infectious Diseases

## 2016-02-26 DIAGNOSIS — Z91128 Patient's intentional underdosing of medication regimen for other reason: Secondary | ICD-10-CM | POA: Diagnosis not present

## 2016-02-26 DIAGNOSIS — T3696XA Underdosing of unspecified systemic antibiotic, initial encounter: Secondary | ICD-10-CM | POA: Diagnosis present

## 2016-02-26 DIAGNOSIS — Z792 Long term (current) use of antibiotics: Secondary | ICD-10-CM | POA: Diagnosis not present

## 2016-02-26 DIAGNOSIS — G8929 Other chronic pain: Secondary | ICD-10-CM | POA: Diagnosis present

## 2016-02-26 DIAGNOSIS — F1110 Opioid abuse, uncomplicated: Secondary | ICD-10-CM | POA: Diagnosis present

## 2016-02-26 DIAGNOSIS — E669 Obesity, unspecified: Secondary | ICD-10-CM | POA: Diagnosis present

## 2016-02-26 DIAGNOSIS — R51 Headache: Secondary | ICD-10-CM | POA: Diagnosis present

## 2016-02-26 DIAGNOSIS — E1169 Type 2 diabetes mellitus with other specified complication: Secondary | ICD-10-CM | POA: Diagnosis present

## 2016-02-26 DIAGNOSIS — T80211A Bloodstream infection due to central venous catheter, initial encounter: Secondary | ICD-10-CM | POA: Diagnosis present

## 2016-02-26 DIAGNOSIS — B49 Unspecified mycosis: Secondary | ICD-10-CM | POA: Diagnosis present

## 2016-02-26 DIAGNOSIS — Z86011 Personal history of benign neoplasm of the brain: Secondary | ICD-10-CM | POA: Diagnosis not present

## 2016-02-26 DIAGNOSIS — Y92009 Unspecified place in unspecified non-institutional (private) residence as the place of occurrence of the external cause: Secondary | ICD-10-CM | POA: Diagnosis not present

## 2016-02-26 DIAGNOSIS — D638 Anemia in other chronic diseases classified elsewhere: Secondary | ICD-10-CM | POA: Diagnosis present

## 2016-02-26 DIAGNOSIS — K7460 Unspecified cirrhosis of liver: Secondary | ICD-10-CM | POA: Diagnosis present

## 2016-02-26 DIAGNOSIS — Z79899 Other long term (current) drug therapy: Secondary | ICD-10-CM | POA: Diagnosis not present

## 2016-02-26 DIAGNOSIS — Z87891 Personal history of nicotine dependence: Secondary | ICD-10-CM | POA: Diagnosis not present

## 2016-02-26 DIAGNOSIS — F0390 Unspecified dementia without behavioral disturbance: Secondary | ICD-10-CM | POA: Diagnosis present

## 2016-02-26 DIAGNOSIS — G40909 Epilepsy, unspecified, not intractable, without status epilepticus: Secondary | ICD-10-CM | POA: Diagnosis present

## 2016-02-26 DIAGNOSIS — J449 Chronic obstructive pulmonary disease, unspecified: Secondary | ICD-10-CM | POA: Diagnosis present

## 2016-02-26 DIAGNOSIS — Z6838 Body mass index (BMI) 38.0-38.9, adult: Secondary | ICD-10-CM | POA: Diagnosis not present

## 2016-02-26 DIAGNOSIS — F068 Other specified mental disorders due to known physiological condition: Secondary | ICD-10-CM | POA: Diagnosis not present

## 2016-02-26 DIAGNOSIS — Z8249 Family history of ischemic heart disease and other diseases of the circulatory system: Secondary | ICD-10-CM | POA: Diagnosis not present

## 2016-02-26 DIAGNOSIS — I10 Essential (primary) hypertension: Secondary | ICD-10-CM | POA: Diagnosis present

## 2016-02-26 DIAGNOSIS — B999 Unspecified infectious disease: Secondary | ICD-10-CM | POA: Diagnosis present

## 2016-02-26 DIAGNOSIS — N3090 Cystitis, unspecified without hematuria: Secondary | ICD-10-CM | POA: Diagnosis present

## 2016-02-26 DIAGNOSIS — Z833 Family history of diabetes mellitus: Secondary | ICD-10-CM | POA: Diagnosis not present

## 2016-02-26 DIAGNOSIS — M869 Osteomyelitis, unspecified: Secondary | ICD-10-CM | POA: Diagnosis present

## 2016-02-26 LAB — BLOOD GAS, ARTERIAL
ACID-BASE DEFICIT: 0.2 mmol/L (ref 0.0–2.0)
Allens test (pass/fail): POSITIVE — AB
Bicarbonate: 23.5 mEq/L (ref 21.0–28.0)
FIO2: 0.21
O2 SAT: 90.4 %
PH ART: 7.46 — AB (ref 7.350–7.450)
Patient temperature: 37
pCO2 arterial: 33 mmHg (ref 32.0–48.0)
pO2, Arterial: 56 mmHg — ABNORMAL LOW (ref 83.0–108.0)

## 2016-02-26 LAB — GLUCOSE, CAPILLARY
GLUCOSE-CAPILLARY: 123 mg/dL — AB (ref 65–99)
GLUCOSE-CAPILLARY: 95 mg/dL (ref 65–99)
GLUCOSE-CAPILLARY: 153 mg/dL — AB (ref 65–99)
Glucose-Capillary: 98 mg/dL (ref 65–99)
Glucose-Capillary: 136 mg/dL — ABNORMAL HIGH (ref 65–99)

## 2016-02-26 LAB — SEDIMENTATION RATE: SED RATE: 88 mm/h — AB (ref 0–30)

## 2016-02-26 LAB — BASIC METABOLIC PANEL
ANION GAP: 4 — AB (ref 5–15)
BUN: 7 mg/dL (ref 6–20)
CALCIUM: 8 mg/dL — AB (ref 8.9–10.3)
CO2: 21 mmol/L — ABNORMAL LOW (ref 22–32)
Chloride: 112 mmol/L — ABNORMAL HIGH (ref 101–111)
Creatinine, Ser: 0.76 mg/dL (ref 0.44–1.00)
Glucose, Bld: 111 mg/dL — ABNORMAL HIGH (ref 65–99)
POTASSIUM: 3.9 mmol/L (ref 3.5–5.1)
SODIUM: 137 mmol/L (ref 135–145)

## 2016-02-26 LAB — C-REACTIVE PROTEIN: CRP: 3.5 mg/dL — AB (ref <1.0)

## 2016-02-26 MED ORDER — GADOBENATE DIMEGLUMINE 529 MG/ML IV SOLN
20.0000 mL | Freq: Once | INTRAVENOUS | Status: AC | PRN
  Administered 2016-02-26: 11:00:00 20 mL via INTRAVENOUS

## 2016-02-26 MED ORDER — FLUCONAZOLE 100 MG PO TABS
400.0000 mg | ORAL_TABLET | Freq: Every day | ORAL | Status: DC
  Administered 2016-02-26 – 2016-02-28 (×3): 400 mg via ORAL
  Filled 2016-02-26 (×2): qty 4
  Filled 2016-02-26: qty 2

## 2016-02-26 MED ORDER — TRAMADOL HCL 50 MG PO TABS: 50.0000 mg | ORAL_TABLET | Freq: Four times a day (QID) | ORAL | Status: DC | PRN

## 2016-02-26 MED ORDER — CEPHALEXIN 250 MG PO CAPS
500.0000 mg | ORAL_CAPSULE | Freq: Three times a day (TID) | ORAL | Status: DC
  Administered 2016-02-26 – 2016-02-29 (×8): 500 mg via ORAL
  Filled 2016-02-26 (×9): qty 2

## 2016-02-26 MED ORDER — ZOLPIDEM TARTRATE 5 MG PO TABS
5.0000 mg | ORAL_TABLET | Freq: Every evening | ORAL | Status: DC | PRN
  Administered 2016-02-27 – 2016-02-28 (×2): 5 mg via ORAL
  Filled 2016-02-26 (×2): qty 1

## 2016-02-26 MED ORDER — LINEZOLID 600 MG PO TABS
600.0000 mg | ORAL_TABLET | Freq: Two times a day (BID) | ORAL | Status: DC
  Administered 2016-02-26 – 2016-02-29 (×6): 600 mg via ORAL
  Filled 2016-02-26 (×7): qty 1

## 2016-02-26 MED ORDER — TIOTROPIUM BROMIDE MONOHYDRATE 18 MCG IN CAPS
18.0000 ug | ORAL_CAPSULE | Freq: Every day | RESPIRATORY_TRACT | Status: DC
  Administered 2016-02-26 – 2016-02-29 (×4): 18 ug via RESPIRATORY_TRACT
  Filled 2016-02-26: qty 5

## 2016-02-26 MED ORDER — FAMOTIDINE 20 MG PO TABS
20.0000 mg | ORAL_TABLET | Freq: Two times a day (BID) | ORAL | Status: DC
  Administered 2016-02-26 – 2016-02-29 (×6): 20 mg via ORAL
  Filled 2016-02-26 (×6): qty 1

## 2016-02-26 NOTE — Progress Notes (Signed)
CONCERNING: IV to Oral Route Change Policy  RECOMMENDATION: This patient is receiving famotidine by the intravenous route.  Based on criteria approved by the Pharmacy and Therapeutics Committee, the intravenous medication(s) is/are being converted to the equivalent oral dose form(s).   DESCRIPTION: These criteria include:  The patient is eating (either orally or via tube) and/or has been taking other orally administered medications for a least 24 hours  The patient has no evidence of active gastrointestinal bleeding or impaired GI absorption (gastrectomy, short bowel, patient on TNA or NPO).  If you have questions about this conversion, please contact the Pharmacy Department  []   [phone] )  [name] [x]   [phone] )  [facility] []   [phone] )  [facility] []   [phone] )  [facility] []   [phone] )  [place], [facility] 02/26/2016 9:40 AM

## 2016-02-26 NOTE — Progress Notes (Signed)
[facility]  Patient Status: Active  [facility] is providing the following services: SN, IV ABX (Vancomycin & Invanz until 03/07/2106)  If patient discharges after hours, please call [phone].   [name] 02/26/2016, 9:42 AM

## 2016-02-26 NOTE — Progress Notes (Signed)
Spoke with family made sure they knew to have patient completely upright when helping her with dinner, reminding patient and family to eat small bites and thoroughly chew and swallow food before taking another bite

## 2016-02-26 NOTE — Progress Notes (Signed)
[facility] NAME: [name]    MR#:  [id number]  DATE OF BIRTH:  August 30, 1955  SUBJECTIVE:   Patient started on antifungals for positive blood cultures for fungemia.   REVIEW OF SYSTEMS:    Review of Systems  Constitutional: Negative.  Negative for chills, fever and malaise/fatigue.  HENT: Negative.  Negative for ear discharge, ear pain, hearing loss, nosebleeds and sore throat.   Eyes: Negative.  Negative for blurred vision and pain.  Respiratory: Negative.  Negative for cough, hemoptysis, shortness of breath and wheezing.   Cardiovascular: Negative.  Negative for chest pain, palpitations and leg swelling.  Gastrointestinal: Negative.  Negative for abdominal pain, blood in stool, diarrhea, nausea and vomiting.  Genitourinary: Negative.  Negative for dysuria.  Musculoskeletal: Negative.  Negative for back pain.  Skin: Negative.   Neurological: Negative for dizziness, tremors, speech change, focal weakness, seizures and headaches.  Endo/Heme/Allergies: Negative.  Does not bruise/bleed easily.  Psychiatric/Behavioral: Negative.  Negative for depression, hallucinations and suicidal ideas.    Tolerating Diet:yes      DRUG ALLERGIES:   Allergies  Allergen Reactions  . Ibuprofen Other (See Comments)    Pt states that she is unable to take this medication because of her liver.    . Sulfa Antibiotics Hives  . Hydroxyzine Other (See Comments)    Causes crying   . Amoxicillin Itching, Rash and Other (See Comments)    Has patient had a PCN reaction causing immediate rash, facial/tongue/throat swelling, SOB or lightheadedness with hypotension: No Has patient had a PCN reaction causing severe rash involving mucus membranes or skin necrosis: No Has patient had a PCN reaction that required hospitalization No Has patient had a PCN reaction occurring within the last 10 years: Yes If all of the above answers are "NO", then may proceed  with Cephalosporin use.  . Chocolate Rash  . Ciprofloxacin Itching and Rash  . Dilaudid [Hydromorphone Hcl] Itching  . Fentanyl Rash  . Hydromorphone Itching  . Metformin Nausea And Vomiting  . Penicillins Itching, Rash and Other (See Comments)    Has patient had a PCN reaction causing immediate rash, facial/tongue/throat swelling, SOB or lightheadedness with hypotension: No Has patient had a PCN reaction causing severe rash involving mucus membranes or skin necrosis: No Has patient had a PCN reaction that required hospitalization No Has patient had a PCN reaction occurring within the last 10 years: Yes If all of the above answers are "NO", then may proceed with Cephalosporin use.  . Strawberry Extract Rash  . Zofran [Ondansetron Hcl] Itching and Nausea And Vomiting    VITALS:  Blood pressure 107/88, pulse 94, temperature 98.9 F (37.2 C), temperature source Oral, resp. rate 20, height 5\' 8"  (1.727 m), weight 115.4 kg (254 lb 6.4 oz), SpO2 95 %.  PHYSICAL EXAMINATION:   Physical Exam    LABORATORY PANEL:   CBC  Recent Labs Lab 02/25/16 0400  WBC 4.1  HGB 7.5*  HCT 24.7*  PLT 126*   ------------------------------------------------------------------------------------------------------------------  Chemistries   Recent Labs Lab 02/25/16 0400 02/26/16 0637  NA 139 137  K 3.5 3.9  CL 112* 112*  CO2 23 21*  GLUCOSE 97 111*  BUN 6 7  CREATININE 0.78 0.76  CALCIUM 8.0* 8.0*  AST 59*  --   ALT 27  --   ALKPHOS 208*  --   BILITOT 1.0  --    ------------------------------------------------------------------------------------------------------------------  Cardiac Enzymes No results for input(s): TROPONINI  in the last 168 hours. ------------------------------------------------------------------------------------------------------------------  RADIOLOGY:  Ct Head Wo Contrast  Result Date: 02/24/2016 CLINICAL DATA:  Recent cranial flap infection. Headaches. Nose  bleeds. Nausea and vomiting. EXAM: CT HEAD WITHOUT CONTRAST TECHNIQUE: Contiguous axial images were obtained from the base of the skull through the vertex without intravenous contrast. COMPARISON:  01/17/2016 FINDINGS: Previous bilateral craniotomies. Craniectomy on the right. Soft tissues of the scalp, residual bony calvarium and right-sided cranioplasty do not show any acute or worrisome finding. No sign of soft tissue infection by imaging. The right cerebral hemisphere appears unremarkable. Left cerebral hemisphere shows atrophy and encephalomalacia in the inferior left frontal lobe, insula and temporal tip as seen previously. No sign of recent infarction, mass lesion, hemorrhage, hydrocephalus or extra-axial collection. Sinuses, middle ears and mastoids are clear. IMPRESSION: No acute finding affecting the brain. Chronic atrophy encephalomalacia affecting the left frontal lobe, insular region and temporal tip as seen previously. Previous bilateral craniotomy and right-sided cranioplasty. No imaging finding to suggest new or ongoing complication. Electronically Signed   By: Nelson Chimes M.D.   On: 02/24/2016 16:15   Mr [name] [id number] Contrast  Result Date: 02/26/2016 CLINICAL DATA:  60 year old female with prior craniotomies and craniectomies, including for meningioma more than 10 years ago. And more recently in February this year right side subdural hematoma evacuation with subsequent cranioplasty and ultimately craniectomy for osteomyelitis and wound debridement. Headache. Initial encounter. EXAM: MRI HEAD WITHOUT AND WITH CONTRAST TECHNIQUE: Multiplanar, multiecho pulse sequences of the brain and surrounding structures were obtained without and with intravenous contrast. CONTRAST:  19mL MULTIHANCE GADOBENATE DIMEGLUMINE 529 MG/ML IV SOLN COMPARISON:  Head CTs without contrast 02/24/2016 and earlier. FINDINGS: Study is intermittently, sometimes rather severely degraded by motion artifact despite repeated  imaging attempts. Major intracranial vascular flow voids appear stable since the most recent MRI in 2013. Chronic left anterior temporal and left inferior frontal gyral encephalomalacia is stable since that time. Chronic left frontotemporal craniotomy changes are stable. More recent right superior and lateral craniectomy postoperative changes. No definite dural thickening. No extra-axial collection identified. Bone marrow signal has diffusely decreased throughout the skull since 2013. Post-contrast images are degraded by motion but no definite enhancing or destructive skull lesion is identified. No restricted diffusion to suggest acute infarction. No midline shift, mass effect, evidence of mass lesion, ventriculomegaly, extra-axial collection or acute intracranial hemorrhage. Cervicomedullary junction within normal limits. Partially empty sella is chronic. No new signal abnormality in the brain compared to 2013. No abnormal parenchymal enhancement is evident. Grossly normal visualized internal auditory structures. Visualized paranasal sinuses and mastoids are stable and well pneumatized. IMPRESSION: 1. Degraded by motion despite repeated imaging attempts. 2. Chronic left frontotemporal craniotomy and more recent right superior craniectomy postoperative changes with no acute no acute intracranial abnormality identified. 3. Diffusely decreased bone marrow signal since 2013 is nonspecific. Query secondary to anemia or other chronic disease. No definite destructive skull lesion. Electronically Signed   By: Genevie Ann M.D.   On: 02/26/2016 11:20   Dg Chest Portable 1 View  Result Date: 02/24/2016 CLINICAL DATA:  Recent neurological infection. Nosebleeds. Nausea, vomiting and headaches. EXAM: PORTABLE CHEST 1 VIEW COMPARISON:  02/02/2016 FINDINGS: Right arm PICC is in place with the tip in the SVC at the azygos level. Previously this was at the [facility] RA junction. The lungs are clear. The vascularity is normal. No  effusions. No bony abnormalities. IMPRESSION: No active cardiopulmonary disease. Right arm PICC tip in the SVC at the azygos  level, previously seen at the SVC RA junction. Electronically Signed   By: Nelson Chimes M.D.   On: 02/24/2016 16:11     ASSESSMENT AND PLAN:    60 year old female with complicated medical history including right hemispheric subdural hematoma evacuation and skull  osteomyelitis  On 6 weeks of IV antibiotics (vancomycin) and PICC line who presents with severe right-sided headache/fever of 103 at home and now found to have fungemia.  1. Fungemia: This could be from PICC line. Continue Diflucan. DC PICC line. ID consult.  2. Right-sided headache: This is thought to be due to chronic headaches. MRI showed no abscess or new findings.  3. 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 COPD: No signs of exacerbation.  5. Seizure disorder: Continue Dilantin and Keppra  6. Chronic pain: Continue tramadol and gabapentin.  7. Anemia of chronic disease: Repeat CBC in a.m.  Management plans discussed with the patient and she is in agreement.  CODE STATUS: full  TOTAL TIME TAKING CARE OF THIS PATIENT: 35 minutes.     POSSIBLE D/C 4 days, DEPENDING ON CLINICAL CONDITION.   Kienna Moncada M.D on 02/26/2016 at 12:22 PM  Between 7am to 6pm - Pager - [phone] After 6pm go to [email or web address] - password [email or web address] [facility]  Office  [phone]  CC: Primary care physician; Pcp Not In System  Note: This dictation was prepared with Dragon dictation along with smaller phrase technology. Any transcriptional errors that result from this process are unintentional.

## 2016-02-26 NOTE — Care Management (Signed)
Admitted to this facility with the diagnosis of acute intractable headache. Lives with husband, Delfino Lovett. Mother is Darla Lesches [phone] or [phone]). Seen Dr. Kary Kos last Thursday. Also sees Dr. Baxter Flattery (signed for home antibiotics) and Dr. Jonne Ply. [facility] is following in the home for IV antibiotics and nursing services.  No skilled facility. No home oxygen, Takes care of all basic and instrumental activities of daily living herself, can drive, but doesn't have a car. Several falls in the home. Cane and rolling walker in the home, but doesn't use them per mother. Prescriptions are filled at [facility].  Shelbie Ammons RN MSN CCM Care Management [phone]

## 2016-02-26 NOTE — Consult Note (Addendum)
[facility]ious Disease     Reason for Consult: Skull osteomyelitis, fungemia   Referring Physician: Bettey Costa  Date of Admission:  02/24/2016   Principal Problem:   Acute intractable headache Active Problems:   HTN (hypertension)   Diabetes mellitus, type 2 (HCC)   Osteomyelitis of skull (HCC)   Acute headache   Infection   HPI: [name] is a 60 y.o. female with hx of polymicrobial skull abscess (MRSA and Klebsiella, peptostreptococcus) being treated as otpt with vancomycin and ertapenem admitted 8/19 with R sided HA and fever to 103.  On admit wbc nml, no fevers but bcx + candida parapsilosis. She has a hx of previous meningioma removal complicated by seizures and osteomyelitis of the skull. Also has hx of chronic pain and narcotic abuse, cirrhosis, HTN, and DM. Has a PICC line but has been non-compliant with home ABX.    Past Medical History:  Diagnosis Date  . Allergy   . Chronic bronchitis ([place])   . Chronic kidney disease   . Cirrhosis ([place])   . Depression   . Diabetes mellitus without complication ([place])   . Diverticulitis   . Emphysema of lung ([place])   . GERD (gastroesophageal reflux disease)   . H/O drug abuse    Clean since 2009  . H/O ETOH abuse    Sober since 2009  . Hepatitis C   . Hypertension   . Malignant brain tumor ([place]) 2007  . Migraines   . Pancreatic ascites   . Pancreatitis, alcoholic 8099  . Rectovaginal fistula   . Seizures ([place])   . Stroke [facility]) 2007   during brain surgery  . Urine incontinence    Past Surgical History:  Procedure Laterality Date  . ABDOMINAL HYSTERECTOMY  1979  . APPENDECTOMY  1999  . BRAIN SURGERY  2007   malignant brain tumor  . CHOLECYSTECTOMY  2001  . CRANIOPLASTY N/A 09/15/2015   Procedure: Cranioplasty with retrieval of bone flap from abdominal pocket;  Surgeon: Ashok Pall, MD;  Location: [facility];  Service: Neurosurgery;  Laterality: N/A;  Cranioplasty with retrieval of bone flap from abdominal  pocket  . CRANIOTOMY Right 08/16/2015   Procedure: Right Fronto-Temporal-Parietal Craniotomy for Evacuation of Hematoma with Bone Flap Placement in Abdomen;  Surgeon: Ashok Pall, MD;  Location: [place] [facility];  Service: Neurosurgery;  Laterality: Right;  . CRANIOTOMY N/A 12/28/2015   Procedure: Craniectomy for wound debridement;  Surgeon: Ashok Pall, MD;  Location: [place] [facility];  Service: Neurosurgery;  Laterality: N/A;  Craniectomy for wound debridement  . EYE SURGERY    . rectovaginal fistula repair w/ colostomy  2011   Social History  Substance Use Topics  . Smoking status: Former Smoker    Packs/day: 0.50    Types: Cigarettes  . Smokeless tobacco: Never Used     Comment: cutting back  . Alcohol use 2.4 oz/week    4 Cans of beer per week   Family History  Problem Relation Age of Onset  . Hypertension Mother   . Heart disease Father   . Diabetes Father   . Cancer Sister     brain  . Cancer Grandchild 8    brain tumor    Allergies:  Allergies  Allergen Reactions  . Ibuprofen Other (See Comments)    Pt states that she is unable to take this medication because of her liver.    . Sulfa Antibiotics Hives  . Hydroxyzine Other (See Comments)    Causes  crying   . Amoxicillin Itching, Rash and Other (See Comments)    Has patient had a PCN reaction causing immediate rash, facial/tongue/throat swelling, SOB or lightheadedness with hypotension: No Has patient had a PCN reaction causing severe rash involving mucus membranes or skin necrosis: No Has patient had a PCN reaction that required hospitalization No Has patient had a PCN reaction occurring within the last 10 years: Yes If all of the above answers are "NO", then may proceed with Cephalosporin use.  . Chocolate Rash  . Ciprofloxacin Itching and Rash  . Dilaudid [Hydromorphone Hcl] Itching  . Fentanyl Rash  . Hydromorphone Itching  . Metformin Nausea And Vomiting  . Penicillins Itching, Rash and Other (See Comments)    Has  patient had a PCN reaction causing immediate rash, facial/tongue/throat swelling, SOB or lightheadedness with hypotension: No Has patient had a PCN reaction causing severe rash involving mucus membranes or skin necrosis: No Has patient had a PCN reaction that required hospitalization No Has patient had a PCN reaction occurring within the last 10 years: Yes If all of the above answers are "NO", then may proceed with Cephalosporin use.  . Strawberry Extract Rash  . Zofran [Ondansetron Hcl] Itching and Nausea And Vomiting    Current antibiotics: Antibiotics Given (last 72 hours)    Date/Time Action Medication Dose Rate   02/24/16 2021 Given   vancomycin (VANCOCIN) 1,500 mg in sodium chloride 0.9 % 500 mL IVPB 1,500 mg 250 mL/hr   02/25/16 0500 Given   meropenem (MERREM) 1 g in sodium chloride 0.9 % 100 mL IVPB 1 g 200 mL/hr   02/25/16 0544 Given   vancomycin (VANCOCIN) 1,250 mg in sodium chloride 0.9 % 250 mL IVPB 1,250 mg 166.7 mL/hr   02/25/16 1546 Given   meropenem (MERREM) 1 g in sodium chloride 0.9 % 100 mL IVPB 1 g 200 mL/hr   02/25/16 1642 Given   vancomycin (VANCOCIN) 1,250 mg in sodium chloride 0.9 % 250 mL IVPB 1,250 mg 166.7 mL/hr   02/25/16 2048 Given   meropenem (MERREM) 1 g in sodium chloride 0.9 % 100 mL IVPB 1 g 200 mL/hr   02/26/16 0524 Given   vancomycin (VANCOCIN) 1,250 mg in sodium chloride 0.9 % 250 mL IVPB 1,250 mg 166.7 mL/hr   02/26/16 0609 Given   meropenem (MERREM) 1 g in sodium chloride 0.9 % 100 mL IVPB 1 g 200 mL/hr      MEDICATIONS: . amitriptyline  50 mg Oral QHS  . amLODipine  10 mg Oral Daily  . diphenoxylate-atropine  1 tablet Oral TID  . docusate sodium  100 mg Oral BID  . famotidine  20 mg Oral BID  . fluconazole (DIFLUCAN) IV  400 mg Intravenous Q24H  . furosemide  20 mg Oral BH-q7a  . gabapentin  600 mg Oral BID  . heparin  5,000 Units Subcutaneous Q8H  . insulin aspart  0-9 Units Subcutaneous TID WC  . ipratropium-albuterol  3 mL  Nebulization TID  . levETIRAcetam  500 mg Oral BID  . lipase/protease/amylase  24,000 Units Oral TID WC  . meropenem (MERREM) IV  1 g Intravenous Q8H  . mometasone-formoterol  2 puff Inhalation BID  . nystatin ointment   Topical BID  . pantoprazole  40 mg Oral Daily  . phenytoin  300 mg Oral BID  . spironolactone  50 mg Oral BID  . tiotropium  18 mcg Inhalation Daily  . vancomycin  1,250 mg Intravenous Q12H    Review  of Systems - unable to obtain  OBJECTIVE: Temp:  [98.7 F (37.1 C)-98.9 F (37.2 C)] 98.9 F (37.2 C) (08/21 0838) Pulse Rate:  [93-105] 94 (08/21 0838) Resp:  [20-22] 20 (08/21 0519) BP: (107-109)/(65-91) 107/88 (08/21 0857) SpO2:  [95 %-100 %] 95 % (08/21 8676) Physical Exam  Constitutional: lethargic but arouses, minimally interactive, confused HENT: [place]/AT, PERRLA, no scleral icterus Skull bone defect on R but scars well healed and no open wound or drainage Mouth/Throat: Oropharynx is clear and dry. No oropharyngeal exudate.  Cardiovascular: Normal rate, regular rhythm and normal heart sounds. Pulmonary/Chest: Effort normal and breath sounds normal. No respiratory distress.  has no wheezes.  Neck = supple, no nuchal rigidity PICC RUE WNL Abdominal: Soft. Bowel sounds are normal.  exhibits no distension. There is no tenderness.  Lymphadenopathy: no cervical adenopathy. No axillary adenopathy Neurological: lethargic,  Skin: Skin is warm and dry. No rash noted. No erythema.   LABS: Results for orders placed or performed during the hospital encounter of 02/24/16 (from the past 48 hour(s))  Lactic acid, plasma     Status: None   Collection Time: 02/24/16  3:55 PM  Result Value Ref Range   Lactic Acid, Venous 1.5 0.5 - 1.9 mmol/L  CBC with Differential     Status: Abnormal   Collection Time: 02/24/16  3:56 PM  Result Value Ref Range   WBC 4.6 3.6 - 11.0 K/uL   RBC 3.68 (L) 3.80 - 5.20 MIL/uL   Hemoglobin 8.2 (L) 12.0 - 16.0 g/dL   HCT 26.3 (L) 35.0 - 47.0 %    MCV 71.5 (L) 80.0 - 100.0 fL   MCH 22.3 (L) 26.0 - 34.0 pg   MCHC 31.2 (L) 32.0 - 36.0 g/dL   RDW 20.7 (H) 11.5 - 14.5 %   Platelets 149 (L) 150 - 440 K/uL   Neutrophils Relative % 55 %   Neutro Abs 2.5 1.4 - 6.5 K/uL   Lymphocytes Relative 27 %   Lymphs Abs 1.2 1.0 - 3.6 K/uL   Monocytes Relative 14 %   Monocytes Absolute 0.7 0.2 - 0.9 K/uL   Eosinophils Relative 3 %   Eosinophils Absolute 0.2 0 - 0.7 K/uL   Basophils Relative 1 %   Basophils Absolute 0.0 0 - 0.1 K/uL  Comprehensive metabolic panel     Status: Abnormal   Collection Time: 02/24/16  3:56 PM  Result Value Ref Range   Sodium 139 135 - 145 mmol/L   Potassium 3.4 (L) 3.5 - 5.1 mmol/L   Chloride 108 101 - 111 mmol/L   CO2 22 22 - 32 mmol/L   Glucose, Bld 78 65 - 99 mg/dL   BUN <5 (L) 6 - 20 mg/dL   Creatinine, Ser 0.85 0.44 - 1.00 mg/dL   Calcium 8.7 (L) 8.9 - 10.3 mg/dL   Total Protein 8.6 (H) 6.5 - 8.1 g/dL   Albumin 3.2 (L) 3.5 - 5.0 g/dL   AST 61 (H) 15 - 41 U/L   ALT 28 14 - 54 U/L   Alkaline Phosphatase 214 (H) 38 - 126 U/L   Total Bilirubin 0.8 0.3 - 1.2 mg/dL   GFR calc non Af Amer >60 >60 mL/min   GFR calc Af Amer >60 >60 mL/min    Comment: (NOTE) The eGFR has been calculated using the CKD EPI equation. This calculation has not been validated in all clinical situations. eGFR's persistently <60 mL/min signify possible Chronic Kidney Disease.    Anion gap 9 5 -  15  Blood culture (routine x 2)     Status: Abnormal (Preliminary result)   Collection Time: 02/24/16  3:56 PM  Result Value Ref Range   Specimen Description BLOOD RIGHT ARM    Special Requests BOTTLES DRAWN AEROBIC AND ANAEROBIC  8CC    Culture  Setup Time (A)     YEAST IN BOTH AEROBIC AND ANAEROBIC BOTTLES CRITICAL RESULT CALLED TO, READ BACK BY AND VERIFIED WITH: LISA KLUTTZ AT 2100 02/25/16 SDR CRITICAL RESULT CALLED TO, READ BACK BY AND VERIFIED WITH: JASON ROBBINS @ 0002 ON 02/26/2016 BY CAF    Culture YEAST (A)    Report Status  PENDING   Urinalysis complete, with microscopic ([facility] only)     Status: Abnormal   Collection Time: 02/24/16  3:56 PM  Result Value Ref Range   Color, Urine YELLOW (A) YELLOW   APPearance CLEAR (A) CLEAR   Glucose, UA NEGATIVE NEGATIVE mg/dL   Bilirubin Urine NEGATIVE NEGATIVE   Ketones, ur NEGATIVE NEGATIVE mg/dL   Specific Gravity, Urine 1.003 (L) 1.005 - 1.030   Hgb urine dipstick NEGATIVE NEGATIVE   pH 6.0 5.0 - 8.0   Protein, ur NEGATIVE NEGATIVE mg/dL   Nitrite NEGATIVE NEGATIVE   Leukocytes, UA TRACE (A) NEGATIVE   RBC / HPF 0-5 0 - 5 RBC/hpf   WBC, UA 6-30 0 - 5 WBC/hpf   Bacteria, UA RARE (A) NONE SEEN   Squamous Epithelial / LPF NONE SEEN NONE SEEN  Urine culture     Status: None (Preliminary result)   Collection Time: 02/24/16  3:56 PM  Result Value Ref Range   Specimen Description URINE, RANDOM    Special Requests NONE    Culture      CULTURE REINCUBATED FOR BETTER GROWTH Performed at [facility]    Report Status PENDING   Blood Culture ID Panel (Reflexed)     Status: Abnormal   Collection Time: 02/24/16  3:56 PM  Result Value Ref Range   Enterococcus species NOT DETECTED NOT DETECTED   Vancomycin resistance NOT DETECTED NOT DETECTED   Listeria monocytogenes NOT DETECTED NOT DETECTED   Staphylococcus species NOT DETECTED NOT DETECTED   Staphylococcus aureus NOT DETECTED NOT DETECTED   Methicillin resistance NOT DETECTED NOT DETECTED   Streptococcus species NOT DETECTED NOT DETECTED   Streptococcus agalactiae NOT DETECTED NOT DETECTED   Streptococcus pneumoniae NOT DETECTED NOT DETECTED   Streptococcus pyogenes NOT DETECTED NOT DETECTED   Acinetobacter baumannii NOT DETECTED NOT DETECTED   Enterobacteriaceae species NOT DETECTED NOT DETECTED   Enterobacter cloacae complex NOT DETECTED NOT DETECTED   Escherichia coli NOT DETECTED NOT DETECTED   Klebsiella oxytoca NOT DETECTED NOT DETECTED   Klebsiella pneumoniae NOT DETECTED NOT DETECTED   Proteus  species NOT DETECTED NOT DETECTED   Serratia marcescens NOT DETECTED NOT DETECTED   Carbapenem resistance NOT DETECTED NOT DETECTED   Haemophilus influenzae NOT DETECTED NOT DETECTED   Neisseria meningitidis NOT DETECTED NOT DETECTED   Pseudomonas aeruginosa NOT DETECTED NOT DETECTED   Candida albicans NOT DETECTED NOT DETECTED   Candida glabrata NOT DETECTED NOT DETECTED   Candida krusei NOT DETECTED NOT DETECTED   Candida parapsilosis DETECTED (A) NOT DETECTED    Comment: CRITICAL RESULT CALLED TO, READ BACK BY AND VERIFIED WITH: CALLED TO LISA KLUTTZ @ 2100 ON 02/25/2016 BY SDR/CAF    Candida tropicalis NOT DETECTED NOT DETECTED  Blood culture (routine x 2)     Status: None (Preliminary result)   Collection  Time: 02/24/16  4:54 PM  Result Value Ref Range   Specimen Description BLOOD RIGHT ASSIST CONTROL    Special Requests BOTTLES DRAWN AEROBIC AND ANAEROBIC 1CC    Culture NO GROWTH 2 DAYS    Report Status PENDING   MRSA PCR Screening     Status: None   Collection Time: 02/24/16  8:00 PM  Result Value Ref Range   MRSA by PCR NEGATIVE NEGATIVE    Comment:        The GeneXpert MRSA Assay (FDA approved for NASAL specimens only), is one component of a comprehensive MRSA colonization surveillance program. It is not intended to diagnose MRSA infection nor to guide or monitor treatment for MRSA infections.   Glucose, capillary     Status: None   Collection Time: 02/24/16  8:53 PM  Result Value Ref Range   Glucose-Capillary 84 65 - 99 mg/dL  Comprehensive metabolic panel     Status: Abnormal   Collection Time: 02/25/16  4:00 AM  Result Value Ref Range   Sodium 139 135 - 145 mmol/L   Potassium 3.5 3.5 - 5.1 mmol/L   Chloride 112 (H) 101 - 111 mmol/L   CO2 23 22 - 32 mmol/L   Glucose, Bld 97 65 - 99 mg/dL   BUN 6 6 - 20 mg/dL   Creatinine, Ser 0.78 0.44 - 1.00 mg/dL   Calcium 8.0 (L) 8.9 - 10.3 mg/dL   Total Protein 7.7 6.5 - 8.1 g/dL   Albumin 2.9 (L) 3.5 - 5.0 g/dL    AST 59 (H) 15 - 41 U/L   ALT 27 14 - 54 U/L   Alkaline Phosphatase 208 (H) 38 - 126 U/L   Total Bilirubin 1.0 0.3 - 1.2 mg/dL   GFR calc non Af Amer >60 >60 mL/min   GFR calc Af Amer >60 >60 mL/min    Comment: (NOTE) The eGFR has been calculated using the CKD EPI equation. This calculation has not been validated in all clinical situations. eGFR's persistently <60 mL/min signify possible Chronic Kidney Disease.    Anion gap 4 (L) 5 - 15  CBC     Status: Abnormal   Collection Time: 02/25/16  4:00 AM  Result Value Ref Range   WBC 4.1 3.6 - 11.0 K/uL   RBC 3.42 (L) 3.80 - 5.20 MIL/uL   Hemoglobin 7.5 (L) 12.0 - 16.0 g/dL   HCT 24.7 (L) 35.0 - 47.0 %   MCV 72.3 (L) 80.0 - 100.0 fL   MCH 22.0 (L) 26.0 - 34.0 pg   MCHC 30.5 (L) 32.0 - 36.0 g/dL   RDW 20.2 (H) 11.5 - 14.5 %   Platelets 126 (L) 150 - 440 K/uL  Glucose, capillary     Status: Abnormal   Collection Time: 02/25/16  7:22 AM  Result Value Ref Range   Glucose-Capillary 108 (H) 65 - 99 mg/dL  Glucose, capillary     Status: None   Collection Time: 02/25/16 11:35 AM  Result Value Ref Range   Glucose-Capillary 90 65 - 99 mg/dL  Glucose, capillary     Status: Abnormal   Collection Time: 02/25/16  4:42 PM  Result Value Ref Range   Glucose-Capillary 121 (H) 65 - 99 mg/dL  Glucose, capillary     Status: Abnormal   Collection Time: 02/25/16  9:10 PM  Result Value Ref Range   Glucose-Capillary 140 (H) 65 - 99 mg/dL  Basic metabolic panel     Status: Abnormal   Collection Time: 02/26/16  6:37 AM  Result Value Ref Range   Sodium 137 135 - 145 mmol/L   Potassium 3.9 3.5 - 5.1 mmol/L   Chloride 112 (H) 101 - 111 mmol/L   CO2 21 (L) 22 - 32 mmol/L   Glucose, Bld 111 (H) 65 - 99 mg/dL   BUN 7 6 - 20 mg/dL   Creatinine, Ser 0.76 0.44 - 1.00 mg/dL   Calcium 8.0 (L) 8.9 - 10.3 mg/dL   GFR calc non Af Amer >60 >60 mL/min   GFR calc Af Amer >60 >60 mL/min    Comment: (NOTE) The eGFR has been calculated using the CKD EPI  equation. This calculation has not been validated in all clinical situations. eGFR's persistently <60 mL/min signify possible Chronic Kidney Disease.    Anion gap 4 (L) 5 - 15  Glucose, capillary     Status: Abnormal   Collection Time: 02/26/16  7:24 AM  Result Value Ref Range   Glucose-Capillary 123 (H) 65 - 99 mg/dL  Glucose, capillary     Status: None   Collection Time: 02/26/16 12:17 PM  Result Value Ref Range   Glucose-Capillary 98 65 - 99 mg/dL   No components found for: ESR, C REACTIVE PROTEIN MICRO: Recent Results (from the past 720 hour(s))  Blood culture (routine x 2)     Status: Abnormal (Preliminary result)   Collection Time: 02/24/16  3:56 PM  Result Value Ref Range Status   Specimen Description BLOOD RIGHT ARM  Final   Special Requests BOTTLES DRAWN AEROBIC AND ANAEROBIC  8CC  Final   Culture  Setup Time (A)  Final    YEAST IN BOTH AEROBIC AND ANAEROBIC BOTTLES CRITICAL RESULT CALLED TO, READ BACK BY AND VERIFIED WITH: LISA KLUTTZ AT 2100 02/25/16 SDR CRITICAL RESULT CALLED TO, READ BACK BY AND VERIFIED WITH: JASON ROBBINS @ 0002 ON 02/26/2016 BY CAF    Culture YEAST (A)  Final   Report Status PENDING  Incomplete  Urine culture     Status: None (Preliminary result)   Collection Time: 02/24/16  3:56 PM  Result Value Ref Range Status   Specimen Description URINE, RANDOM  Final   Special Requests NONE  Final   Culture   Final    CULTURE REINCUBATED FOR BETTER GROWTH Performed at [facility]    Report Status PENDING  Incomplete  Blood Culture ID Panel (Reflexed)     Status: Abnormal   Collection Time: 02/24/16  3:56 PM  Result Value Ref Range Status   Enterococcus species NOT DETECTED NOT DETECTED Final   Vancomycin resistance NOT DETECTED NOT DETECTED Final   Listeria monocytogenes NOT DETECTED NOT DETECTED Final   Staphylococcus species NOT DETECTED NOT DETECTED Final   Staphylococcus aureus NOT DETECTED NOT DETECTED Final   Methicillin resistance  NOT DETECTED NOT DETECTED Final   Streptococcus species NOT DETECTED NOT DETECTED Final   Streptococcus agalactiae NOT DETECTED NOT DETECTED Final   Streptococcus pneumoniae NOT DETECTED NOT DETECTED Final   Streptococcus pyogenes NOT DETECTED NOT DETECTED Final   Acinetobacter baumannii NOT DETECTED NOT DETECTED Final   Enterobacteriaceae species NOT DETECTED NOT DETECTED Final   Enterobacter cloacae complex NOT DETECTED NOT DETECTED Final   Escherichia coli NOT DETECTED NOT DETECTED Final   Klebsiella oxytoca NOT DETECTED NOT DETECTED Final   Klebsiella pneumoniae NOT DETECTED NOT DETECTED Final   Proteus species NOT DETECTED NOT DETECTED Final   Serratia marcescens NOT DETECTED NOT DETECTED Final   Carbapenem resistance NOT DETECTED  NOT DETECTED Final   Haemophilus influenzae NOT DETECTED NOT DETECTED Final   Neisseria meningitidis NOT DETECTED NOT DETECTED Final   Pseudomonas aeruginosa NOT DETECTED NOT DETECTED Final   Candida albicans NOT DETECTED NOT DETECTED Final   Candida glabrata NOT DETECTED NOT DETECTED Final   Candida krusei NOT DETECTED NOT DETECTED Final   Candida parapsilosis DETECTED (A) NOT DETECTED Final    Comment: CRITICAL RESULT CALLED TO, READ BACK BY AND VERIFIED WITH: CALLED TO LISA KLUTTZ @ 2100 ON 02/25/2016 BY SDR/CAF    Candida tropicalis NOT DETECTED NOT DETECTED Final  Blood culture (routine x 2)     Status: None (Preliminary result)   Collection Time: 02/24/16  4:54 PM  Result Value Ref Range Status   Specimen Description BLOOD RIGHT ASSIST CONTROL  Final   Special Requests BOTTLES DRAWN AEROBIC AND ANAEROBIC 1CC  Final   Culture NO GROWTH 2 DAYS  Final   Report Status PENDING  Incomplete  MRSA PCR Screening     Status: None   Collection Time: 02/24/16  8:00 PM  Result Value Ref Range Status   MRSA by PCR NEGATIVE NEGATIVE Final    Comment:        The GeneXpert MRSA Assay (FDA approved for NASAL specimens only), is one component of  a comprehensive MRSA colonization surveillance program. It is not intended to diagnose MRSA infection nor to guide or monitor treatment for MRSA infections.     IMAGING: X-ray Chest Pa Or Ap  Result Date: 02/02/2016 CLINICAL DATA:  Temporary CVC line placement. EXAM: CHEST 1 VIEW COMPARISON:  01/20/2016 FINDINGS: Right PICC line terminates at the expected location of cavoatrial junction. The cardiac silhouette is normal. There is abnormal prominence of the right hilum more than left hilum. There is no evidence of focal airspace consolidation, pleural effusion or pneumothorax. Osseous structures are without acute abnormality. Soft tissues are grossly normal. IMPRESSION: Right PICC line with tip at the cavoatrial junction. Abnormal prominence of right more than left hilar. This may represent overlapping vascular shadows, however mediastinal/hilar masses or lymphadenopathy cannot be excluded. CT of the chest with contrast may be considered if further imaging evaluation if desired. Electronically Signed   By: Fidela Salisbury M.D.   On: 02/02/2016 13:56  Ct Head Wo Contrast  Result Date: 02/24/2016 CLINICAL DATA:  Recent cranial flap infection. Headaches. Nose bleeds. Nausea and vomiting. EXAM: CT HEAD WITHOUT CONTRAST TECHNIQUE: Contiguous axial images were obtained from the base of the skull through the vertex without intravenous contrast. COMPARISON:  01/17/2016 FINDINGS: Previous bilateral craniotomies. Craniectomy on the right. Soft tissues of the scalp, residual bony calvarium and right-sided cranioplasty do not show any acute or worrisome finding. No sign of soft tissue infection by imaging. The right cerebral hemisphere appears unremarkable. Left cerebral hemisphere shows atrophy and encephalomalacia in the inferior left frontal lobe, insula and temporal tip as seen previously. No sign of recent infarction, mass lesion, hemorrhage, hydrocephalus or extra-axial collection. Sinuses, middle ears  and mastoids are clear. IMPRESSION: No acute finding affecting the brain. Chronic atrophy encephalomalacia affecting the left frontal lobe, insular region and temporal tip as seen previously. Previous bilateral craniotomy and right-sided cranioplasty. No imaging finding to suggest new or ongoing complication. Electronically Signed   By: Nelson Chimes M.D.   On: 02/24/2016 16:15   Mr [name] VV Contrast  Result Date: 02/26/2016 CLINICAL DATA:  60 year old female with prior craniotomies and craniectomies, including for meningioma more than 10 years ago. And more  recently in February this year right side subdural hematoma evacuation with subsequent cranioplasty and ultimately craniectomy for osteomyelitis and wound debridement. Headache. Initial encounter. EXAM: MRI HEAD WITHOUT AND WITH CONTRAST TECHNIQUE: Multiplanar, multiecho pulse sequences of the brain and surrounding structures were obtained without and with intravenous contrast. CONTRAST:  51m MULTIHANCE GADOBENATE DIMEGLUMINE 529 MG/ML IV SOLN COMPARISON:  Head CTs without contrast 02/24/2016 and earlier. FINDINGS: Study is intermittently, sometimes rather severely degraded by motion artifact despite repeated imaging attempts. Major intracranial vascular flow voids appear stable since the most recent MRI in 2013. Chronic left anterior temporal and left inferior frontal gyral encephalomalacia is stable since that time. Chronic left frontotemporal craniotomy changes are stable. More recent right superior and lateral craniectomy postoperative changes. No definite dural thickening. No extra-axial collection identified. Bone marrow signal has diffusely decreased throughout the skull since 2013. Post-contrast images are degraded by motion but no definite enhancing or destructive skull lesion is identified. No restricted diffusion to suggest acute infarction. No midline shift, mass effect, evidence of mass lesion, ventriculomegaly, extra-axial collection or acute  intracranial hemorrhage. Cervicomedullary junction within normal limits. Partially empty sella is chronic. No new signal abnormality in the brain compared to 2013. No abnormal parenchymal enhancement is evident. Grossly normal visualized internal auditory structures. Visualized paranasal sinuses and mastoids are stable and well pneumatized. IMPRESSION: 1. Degraded by motion despite repeated imaging attempts. 2. Chronic left frontotemporal craniotomy and more recent right superior craniectomy postoperative changes with no acute no acute intracranial abnormality identified. 3. Diffusely decreased bone marrow signal since 2013 is nonspecific. Query secondary to anemia or other chronic disease. No definite destructive skull lesion. Electronically Signed   By: HGenevie AnnM.D.   On: 02/26/2016 11:20   Dg Chest Portable 1 View  Result Date: 02/24/2016 CLINICAL DATA:  Recent neurological infection. Nosebleeds. Nausea, vomiting and headaches. EXAM: PORTABLE CHEST 1 VIEW COMPARISON:  02/02/2016 FINDINGS: Right arm PICC is in place with the tip in the SVC at the azygos level. [facility] The lungs are clear. The vascularity is normal. No effusions. No bony abnormalities. IMPRESSION: No active cardiopulmonary disease. Right arm PICC tip in the SVC at the azygos level, previously seen at the SVC RA junction. Electronically Signed   By: MNelson ChimesM.D.   On: 02/24/2016 16:11   12/28/15 Newer results are available. Click to view them now.   [id number]  Specimen Description WOUND  Special Requests OPENED SCALP WOUND PREPROCEDURE  Gram Stain MODERATE WBC PRESENT, PREDOMINANTLY PMN  ABUNDANT GRAM POSITIVE COCCI IN CLUSTERS  MODERATE GRAM POSITIVE RODS   Culture ABUNDANT METHICILLIN RESISTANT STAPHYLOCOCCUS AUREUS  ABUNDANT KLEBSIELLA PNEUMONIAE   Report Status 12/31/2015 FINAL  Organism ID, Bacteria METHICILLIN RESISTANT STAPHYLOCOCCUS AUREUS  Organism ID, Bacteria KLEBSIELLA  PNEUMONIAE  Resulting Agency [facility]  Susceptibility    Methicillin resistant staphylococcus aureus Klebsiella pneumoniae   MIC MIC  AMPICILLIN   >=32 RESIST... Resistant  AMPICILLIN/SULBACTAM   4 SENSITIVE  Sensitive  CEFAZOLIN   <=4 SENSITIVE "><=4 SENSITIVE  Sensitive  CEFEPIME   <=1 SENSITIVE "><=1 SENSITIVE  Sensitive  CEFTAZIDIME   <=1 SENSITIVE "><=1 SENSITIVE  Sensitive  CEFTRIAXONE   <=1 SENSITIVE "><=1 SENSITIVE  Sensitive  CIPROFLOXACIN >=8 RESISTANT  Resistant <=0.25 SENSITIVE "><=0.25 SENS... Sensitive  CLINDAMYCIN >=8 RESISTANT  Resistant    ERYTHROMYCIN >=8 RESISTANT  Resistant    GENTAMICIN <=0.5 SENSITIVE "><=0.5 SENSI... Sensitive <=1 SENSITIVE "><=1 SENSITIVE  Sensitive  IMIPENEM   <=0.25  SENSITIVE "><=0.25 SENS... Sensitive  Inducible Clindamycin NEGATIVE  Sensitive    OXACILLIN >=4 RESISTANT  Resistant    PIP/TAZO   8 SENSITIVE  Sensitive  RIFAMPIN <=0.5 SENSITIVE "><=0.5 SENSI... Sensitive    TETRACYCLINE >=16 RESIST... Resistant    TRIMETH/SULFA <=10 SENSITIVE "><=10 SENSIT... Sensitive <=20 SENSITIVE "><=20 SENSIT... Sensitive  VANCOMYCIN <=0.5 SENSITIVE "><=0.5 SENSI... Sensitive          Susceptibility Comments   Methicillin resistant staphylococcus aureus  ABUNDANT METHICILLIN RESISTANT STAPHYLOCOCCUS AUREUS  Klebsiella pneumoniae  ABUNDANT KLEBSIELLA PNEUMONIAE    Specimen Collected: 12/28/15 23:56 Last Resulted: 12/31/15 10:32      Assessment:   [name] is a 60 y.o. female s/p craniotomy in March 1674 complicated by osteomyelitis, s/p craniotomy and removal of flap and hardware with cultures growing MRSA, Klebsiella and Peptostreptococcus.   Recommendations Candidemia - PICC associated- C. 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 Prior cx with MRSA and Klebsiella. (but the MRSA  was R clinda and doxy and she is bactrim allergic). Continue vanco and meropenem for now but if no access able to be obtained can change to linezolid for there MRSA and can use keflex or cipro for the Klebsiella.\  Thank you very much for allowing me to participate in the care of this patient. Please call with questions.   Cheral Marker. Ola Spurr, MD

## 2016-02-26 NOTE — Progress Notes (Signed)
Dr Benjie Karvonen aware unable to obtain IV access. Per MD remove PICC line per order, IV meds changed to oral

## 2016-02-26 NOTE — Progress Notes (Signed)
In room, attempting to get IV on pt.  Pt's family witnessed spooning water and ice chips into pt mouth while pt laying down.  Pt is very lethargic from xanax and coughing.  Educated pt's family on not putting foods and liquids in pt's mouth when she is so sleepy and laying down in bed.  Made them aware of pt's risk of aspiration pneumonia if they continue.  Pt's verbalized understanding.  Clarise Cruz, RN

## 2016-02-26 NOTE — Progress Notes (Signed)
Pt still continues to be sleepy but will arose with stimulation and voice. Family in room and trying to feed pt. I asked her that it may not be a good idea since pt is not swallowing food and just holding food in her mouth and following back asleep. RN Gerald Stabs made aware.

## 2016-02-26 NOTE — Plan of Care (Signed)
Problem: Safety: Goal: Ability to remain free from injury will improve Outcome: Progressing Patient has been made aware of her need to call for assistance when ambulating or toileting. Patient verbalize understanding and compliance. She is very much aware of her physical limitations and calls for help everytime she needs to urinate.

## 2016-02-27 ENCOUNTER — Inpatient Hospital Stay: Payer: Medicare Other | Admitting: Internal Medicine

## 2016-02-27 LAB — BASIC METABOLIC PANEL
ANION GAP: 7 (ref 5–15)
BUN: 7 mg/dL (ref 6–20)
CHLORIDE: 110 mmol/L (ref 101–111)
CO2: 21 mmol/L — AB (ref 22–32)
CREATININE: 0.83 mg/dL (ref 0.44–1.00)
Calcium: 8.8 mg/dL — ABNORMAL LOW (ref 8.9–10.3)
GFR calc non Af Amer: >60 mL/min (ref >60)
Glucose, Bld: 155 mg/dL — ABNORMAL HIGH (ref 65–99)
POTASSIUM: 3.9 mmol/L (ref 3.5–5.1)
SODIUM: 138 mmol/L (ref 135–145)

## 2016-02-27 LAB — CBC
HCT: 24.6 % — ABNORMAL LOW (ref 35.0–47.0)
Hemoglobin: 7.5 g/dL — ABNORMAL LOW (ref 12.0–16.0)
MCH: 22 pg — AB (ref 26.0–34.0)
MCHC: 30.7 g/dL — ABNORMAL LOW (ref 32.0–36.0)
MCV: 71.7 fL — AB (ref 80.0–100.0)
PLATELETS: 130 10*3/uL — AB (ref 150–440)
RBC: 3.42 MIL/uL — AB (ref 3.80–5.20)
RDW: 20.7 % — ABNORMAL HIGH (ref 11.5–14.5)
WBC: 4 10*3/uL (ref 3.6–11.0)

## 2016-02-27 LAB — ECHOCARDIOGRAM COMPLETE
E/e' ratio: 11.9
EWDT: 250 ms
FS: 27 % — AB (ref 28–44)
HEIGHTINCHES: 68 in
IVS/LV PW RATIO, ED: 0.98
LA diam end sys: 31 mm
LA vol A4C: 55.2 ml
LADIAMINDEX: 1.29 cm/m2
LASIZE: 31 mm
LAVOL: 71.6 mL
LAVOLIN: 29.8 mL/m2
LV E/e' medial: 11.9
LV E/e'average: 11.9
LV PW d: 10.2 mm — AB (ref 0.6–1.1)
LV TDI E'MEDIAL: 7.83
Lateral S' vel: 10 cm/s
MV Dec: 250
MVPKAVEL: 76.6 m/s
MVPKEVEL: 66 m/s
Weight: 4070.4 oz

## 2016-02-27 LAB — URINE CULTURE

## 2016-02-27 LAB — GLUCOSE, CAPILLARY
GLUCOSE-CAPILLARY: 100 mg/dL — AB (ref 65–99)
Glucose-Capillary: 95 mg/dL (ref 65–99)
Glucose-Capillary: 119 mg/dL — ABNORMAL HIGH (ref 65–99)
Glucose-Capillary: 102 mg/dL — ABNORMAL HIGH (ref 65–99)

## 2016-02-27 IMAGING — CT CT ABD-PELV W/ CM
2 of 5 series · 16 of 46 positions shown, 18 images · IV contrast (isovue)
Comparison: Prior CT from 01/20/2014

CLINICAL DATA: Vomiting, knot in left side

EXAM:
CT ABDOMEN AND PELVIS WITH CONTRAST
TECHNIQUE: Multidetector CT imaging of the abdomen and pelvis was performed
using the standard protocol following bolus administration of
intravenous contrast.
CONTRAST:  100 cc of Isovue 3 7

[Series 2: routine abd pel with · axial · 0.82mm/px · z∈[-1064,-589]mm · 13 of 107 slices shown, 15 images]
[im 6/107  soft-tissue]
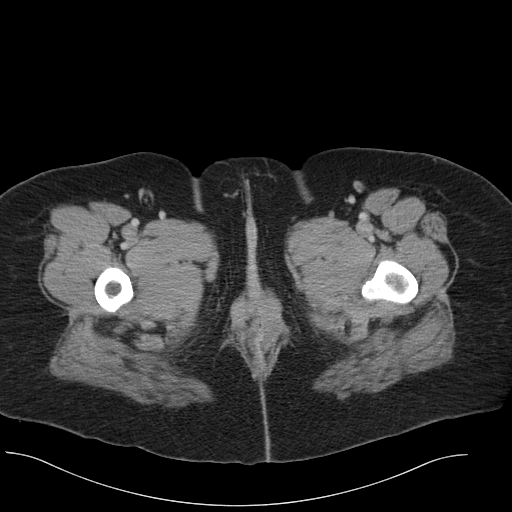
[im 6/107  bone]
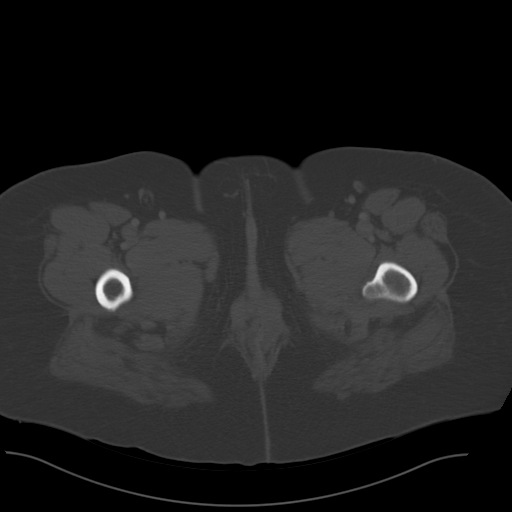
[im 12/107  soft-tissue]
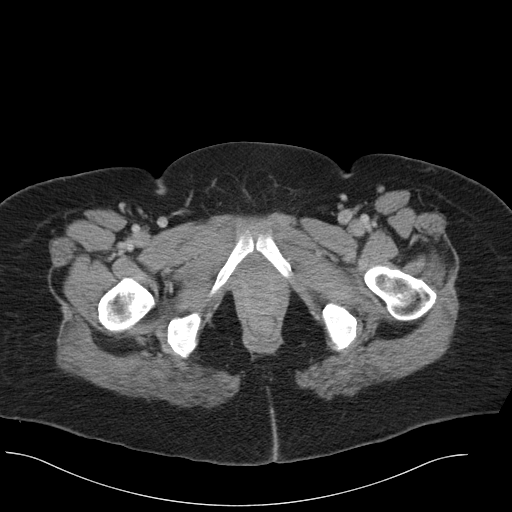
[im 24/107  soft-tissue]
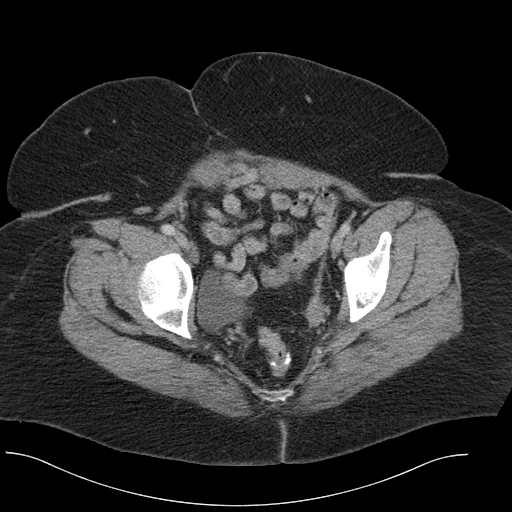
[im 30/107  soft-tissue]
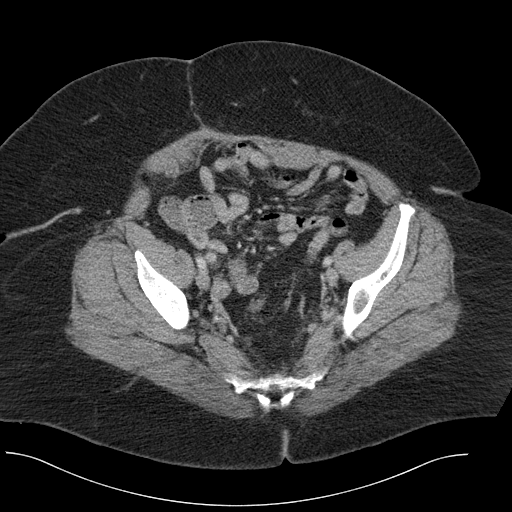
[im 36/107  soft-tissue]
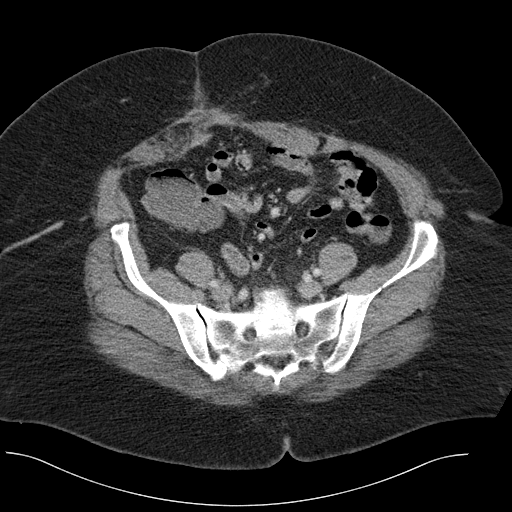
[im 48/107  soft-tissue]
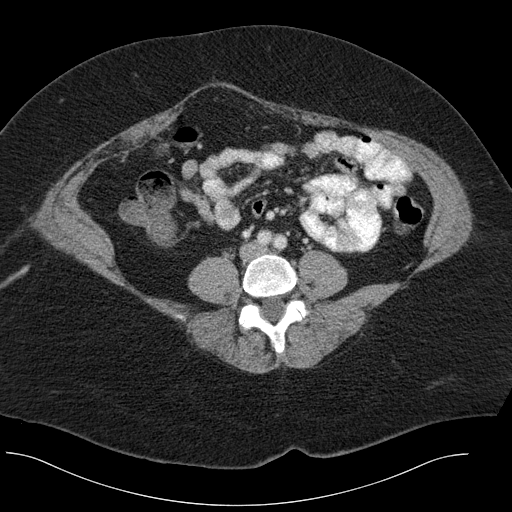
[im 54/107  soft-tissue]
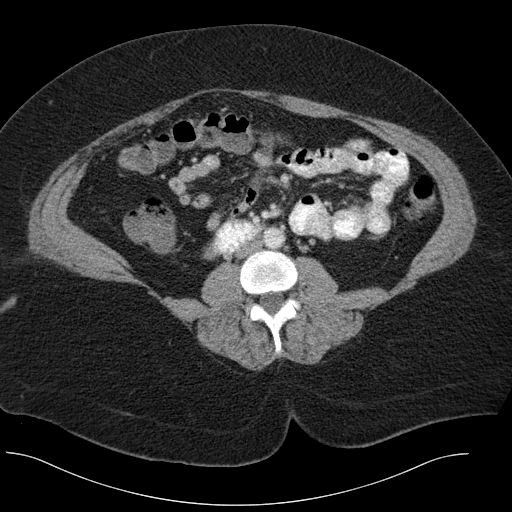
[im 59/107  soft-tissue]
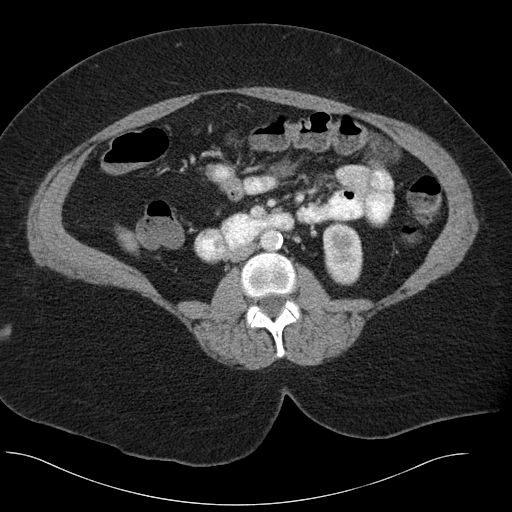
[im 71/107  soft-tissue]
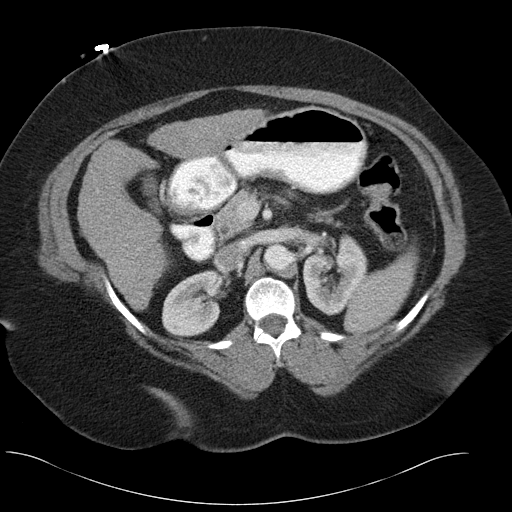
[im 71/107  bone]
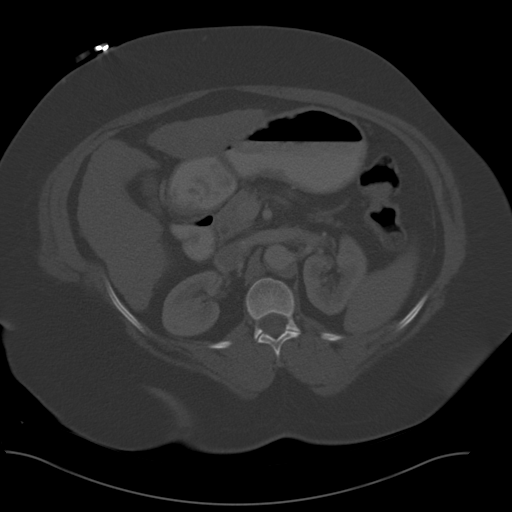
[im 77/107  soft-tissue]
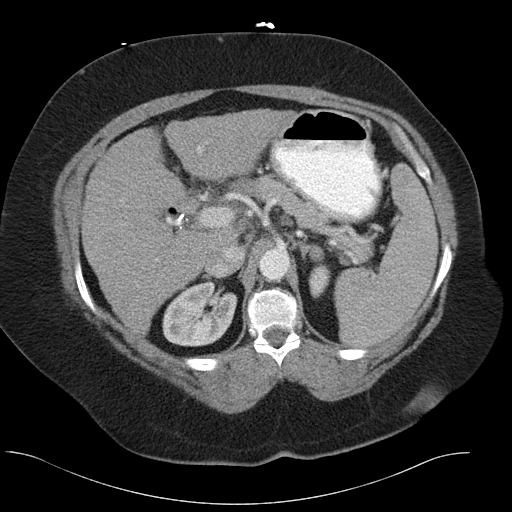
[im 83/107  soft-tissue]
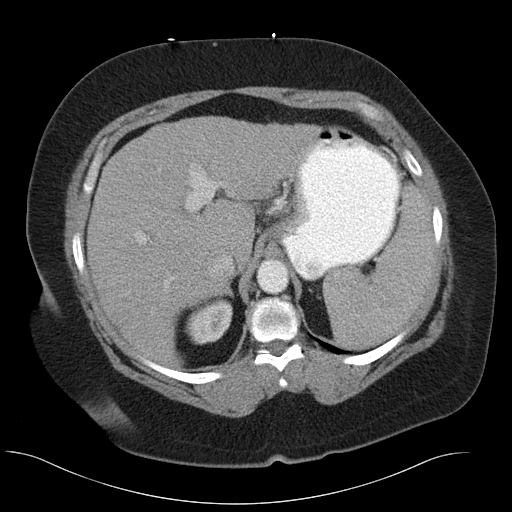
[im 95/107  soft-tissue]
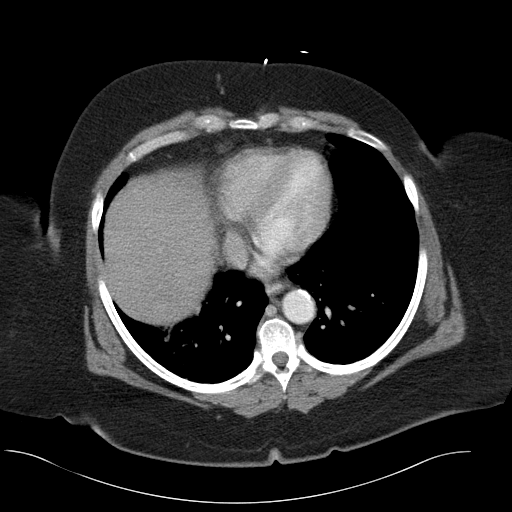
[im 101/107  soft-tissue]
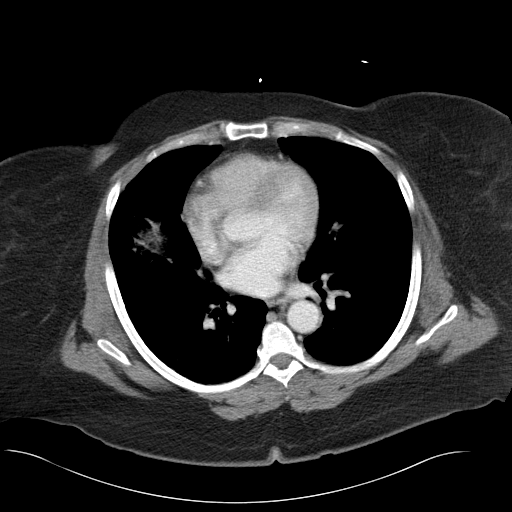

[Series 6: cor routine abd pel with · coronal · 1.03mm/px · 3 of 151 slices shown]
[im 51/151  soft-tissue]
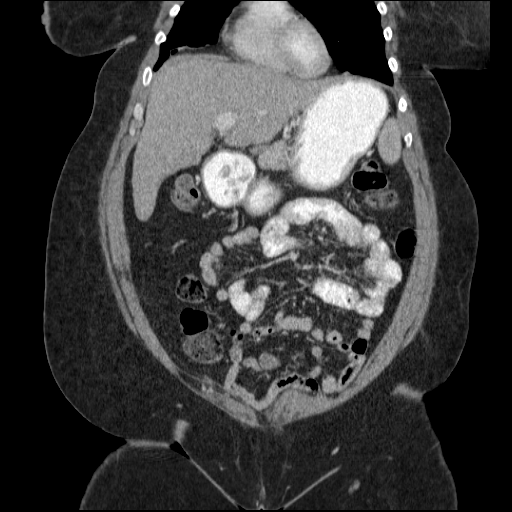
[im 67/151  soft-tissue]
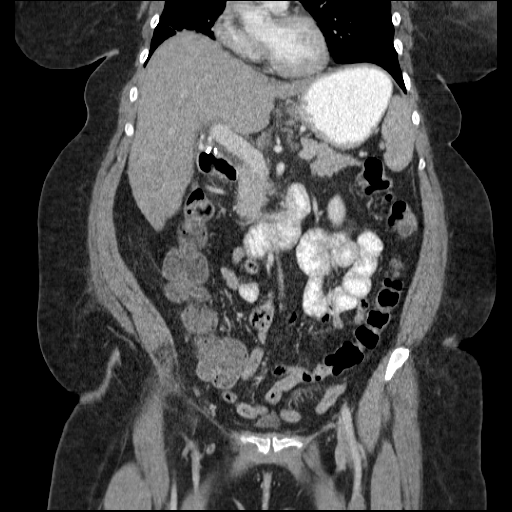
[im 84/151  soft-tissue]
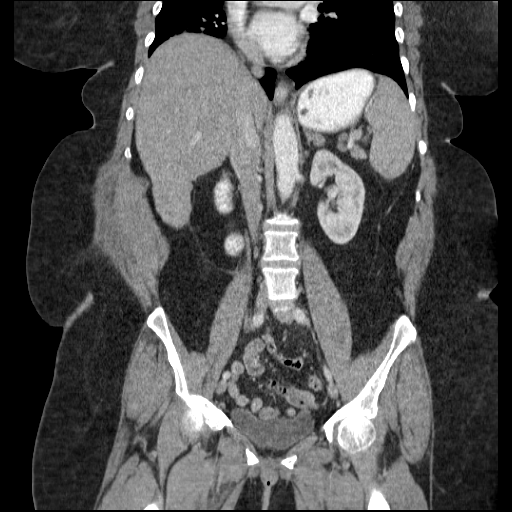

[16 of 46 positions shown; findings below may reference images not displayed]

FINDINGS: Patchy bibasilar atelectasis present within the visualized lung
bases, right greater than left. No pleural or pericardial effusion.

The liver demonstrates a nodular contour, compatible with cirrhosis.
No focal intrahepatic mass. Underlying fatty changes within the
liver again noted. Gallbladder is absent. No biliary ductal
dilatation. Spleen is enlarged measuring 15.6 cm in AP diameter.
cm left adrenal lesion is stable. Right adrenal gland and pancreas
within normal limits.

Kidneys are equal in size with symmetric enhancement. No
nephrolithiasis, hydronephrosis, or focal enhancing renal mass.

Stomach within normal limits. No evidence of bowel obstruction. No
abnormal wall thickening, mucosal enhancement, or inflammatory fat
stranding seen about the bowels. Suture material present about the
distal colon.

Bladder within normal limits.  Uterus and ovaries are absent.

No free intraperitoneal air. No ascites. No adenopathy.
Atherosclerotic disease within the intra-abdominal aorta present
without aneurysm.

Small ventral hernia containing fat again seen, stable from prior.
Scarring within the right anterior abdominal wall also not
significantly changed.

No acute osseous abnormality. No worrisome lytic or blastic osseous
lesion.
IMPRESSION: 1. Cirrhosis with hepatomegaly and splenomegaly, stable. No focal
intrahepatic mass. No ascites.
2. No other acute intra-abdominal or pelvic process.
3. Stable small left adrenal lesion, likely a benign given its
relative stability from 4211.
4. Stable ventral hernia with scarring in the right periumbilical
region.

## 2016-02-27 MED ORDER — ALBUTEROL SULFATE (2.5 MG/3ML) 0.083% IN NEBU
2.5000 mg | INHALATION_SOLUTION | Freq: Two times a day (BID) | RESPIRATORY_TRACT | Status: DC
  Administered 2016-02-27 – 2016-02-29 (×4): 2.5 mg via RESPIRATORY_TRACT
  Filled 2016-02-27 (×3): qty 3

## 2016-02-27 MED ORDER — OXYCODONE HCL 5 MG PO TABS
15.0000 mg | ORAL_TABLET | Freq: Two times a day (BID) | ORAL | Status: DC | PRN
  Administered 2016-02-27 – 2016-02-29 (×4): 15 mg via ORAL
  Filled 2016-02-27: qty 2
  Filled 2016-02-27 (×4): qty 3

## 2016-02-27 NOTE — Progress Notes (Addendum)
[facility] NAME: [name]    MR#:  [id number]  DATE OF BIRTH:  1955-08-17  SUBJECTIVE:   Patient wants to be restarted on OXYCODONE PRN as she is taking at home  REVIEW OF SYSTEMS:    Review of Systems  Constitutional: Negative.  Negative for chills, fever and malaise/fatigue.  HENT: Negative.  Negative for ear discharge, ear pain, hearing loss, nosebleeds and sore throat.   Eyes: Negative.  Negative for blurred vision and pain.  Respiratory: Negative.  Negative for cough, hemoptysis, shortness of breath and wheezing.   Cardiovascular: Negative.  Negative for chest pain, palpitations and leg swelling.  Gastrointestinal: Negative.  Negative for abdominal pain, blood in stool, diarrhea, nausea and vomiting.  Genitourinary: Negative.  Negative for dysuria.  Musculoskeletal: Negative.  Negative for back pain.  Skin: Negative.   Neurological: Negative for dizziness, tremors, speech change, focal weakness, seizures and headaches.  Endo/Heme/Allergies: Negative.  Does not bruise/bleed easily.  Psychiatric/Behavioral: Negative.  Negative for depression, hallucinations and suicidal ideas.    Tolerating Diet:yes      DRUG ALLERGIES:   Allergies  Allergen Reactions  . Ibuprofen Other (See Comments)    Pt states that she is unable to take this medication because of her liver.    . Sulfa Antibiotics Hives  . Hydroxyzine Other (See Comments)    Causes crying   . Amoxicillin Itching, Rash and Other (See Comments)    Has patient had a PCN reaction causing immediate rash, facial/tongue/throat swelling, SOB or lightheadedness with hypotension: No Has patient had a PCN reaction causing severe rash involving mucus membranes or skin necrosis: No Has patient had a PCN reaction that required hospitalization No Has patient had a PCN reaction occurring within the last 10 years: Yes If all of the above answers are "NO", then may proceed with  Cephalosporin use.  . Chocolate Rash  . Ciprofloxacin Itching and Rash  . Dilaudid [Hydromorphone Hcl] Itching  . Fentanyl Rash  . Hydromorphone Itching  . Metformin Nausea And Vomiting  . Penicillins Itching, Rash and Other (See Comments)    Has patient had a PCN reaction causing immediate rash, facial/tongue/throat swelling, SOB or lightheadedness with hypotension: No Has patient had a PCN reaction causing severe rash involving mucus membranes or skin necrosis: No Has patient had a PCN reaction that required hospitalization No Has patient had a PCN reaction occurring within the last 10 years: Yes If all of the above answers are "NO", then may proceed with Cephalosporin use.  . Strawberry Extract Rash  . Zofran [Ondansetron Hcl] Itching and Nausea And Vomiting    VITALS:  Blood pressure 114/67, pulse 91, temperature 98.3 F (36.8 C), temperature source Oral, resp. rate 20, height 5\' 8"  (1.727 m), weight 117.1 kg (258 lb 1.6 oz), SpO2 94 %.  PHYSICAL EXAMINATION:   Physical Exam  Constitutional: She is oriented to person, place, and time and well-developed, well-nourished, and in no distress. No distress.  HENT:  Head: Normocephalic.  Eyes: No scleral icterus.  Neck: Normal range of motion. Neck supple. No JVD present. No tracheal deviation present.  Cardiovascular: Normal rate, regular rhythm and normal heart sounds.  Exam reveals no gallop and no friction rub.   No murmur heard. Pulmonary/Chest: Effort normal and breath sounds normal. No respiratory distress. She has no wheezes. She has no rales. She exhibits no tenderness.  Abdominal: Soft. Bowel sounds are normal. She exhibits no distension and no mass.  There is tenderness (diffuse). There is no rebound and no guarding.  Musculoskeletal: Normal range of motion. She exhibits no edema.  Neurological: She is alert and oriented to person, place, and time.  Skin: Skin is warm. No rash noted. No erythema.  Psychiatric: Affect and  judgment normal.      LABORATORY PANEL:   CBC  Recent Labs Lab 02/27/16 0019  WBC 4.0  HGB 7.5*  HCT 24.6*  PLT 130*   ------------------------------------------------------------------------------------------------------------------  Chemistries   Recent Labs Lab 02/25/16 0400  02/27/16 0019  NA 139  < > 138  K 3.5  < > 3.9  CL 112*  < > 110  CO2 23  < > 21*  GLUCOSE 97  < > 155*  BUN 6  < > 7  CREATININE 0.78  < > 0.83  CALCIUM 8.0*  < > 8.8*  AST 59*  --   --   ALT 27  --   --   ALKPHOS 208*  --   --   BILITOT 1.0  --   --   < > = values in this interval not displayed. ------------------------------------------------------------------------------------------------------------------  Cardiac Enzymes No results for input(s): TROPONINI in the last 168 hours. ------------------------------------------------------------------------------------------------------------------  RADIOLOGY:  Dg Chest 2 View  Result Date: 02/26/2016 CLINICAL DATA:  Subjective fever, headache EXAM: CHEST  2 VIEW COMPARISON:  02/24/2016 FINDINGS: Low lung volumes. Mild right basilar opacity, likely atelectasis. No pleural effusion or pneumothorax. The heart is normal in size Visualized osseous structures are within normal limits. IMPRESSION: Mild right basilar opacity, likely atelectasis. No evidence of acute cardiopulmonary disease. Electronically Signed   By: Julian Hy M.D.   On: 02/26/2016 18:26   Mr [name] [id number] Contrast  Result Date: 02/26/2016 CLINICAL DATA:  60 year old female with prior craniotomies and craniectomies, including for meningioma more than 10 years ago. And more recently in February this year right side subdural hematoma evacuation with subsequent cranioplasty and ultimately craniectomy for osteomyelitis and wound debridement. Headache. Initial encounter. EXAM: MRI HEAD WITHOUT AND WITH CONTRAST TECHNIQUE: Multiplanar, multiecho pulse sequences of the brain and  surrounding structures were obtained without and with intravenous contrast. CONTRAST:  44mL MULTIHANCE GADOBENATE DIMEGLUMINE 529 MG/ML IV SOLN COMPARISON:  Head CTs without contrast 02/24/2016 and earlier. FINDINGS: Study is intermittently, sometimes rather severely degraded by motion artifact despite repeated imaging attempts. Major intracranial vascular flow voids appear stable since the most recent MRI in 2013. Chronic left anterior temporal and left inferior frontal gyral encephalomalacia is stable since that time. Chronic left frontotemporal craniotomy changes are stable. More recent right superior and lateral craniectomy postoperative changes. No definite dural thickening. No extra-axial collection identified. Bone marrow signal has diffusely decreased throughout the skull since 2013. Post-contrast images are degraded by motion but no definite enhancing or destructive skull lesion is identified. No restricted diffusion to suggest acute infarction. No midline shift, mass effect, evidence of mass lesion, ventriculomegaly, extra-axial collection or acute intracranial hemorrhage. Cervicomedullary junction within normal limits. Partially empty sella is chronic. No new signal abnormality in the brain compared to 2013. No abnormal parenchymal enhancement is evident. Grossly normal visualized internal auditory structures. Visualized paranasal sinuses and mastoids are stable and well pneumatized. IMPRESSION: 1. Degraded by motion despite repeated imaging attempts. 2. Chronic left frontotemporal craniotomy and more recent right superior craniectomy postoperative changes with no acute no acute intracranial abnormality identified. 3. Diffusely decreased bone marrow signal since 2013 is nonspecific. Query secondary to anemia or other chronic disease. No definite  destructive skull lesion. Electronically Signed   By: Genevie Ann M.D.   On: 02/26/2016 11:20     ASSESSMENT AND PLAN:    60 year old female with complicated  medical history including right hemispheric subdural hematoma evacuation and skull  osteomyelitis  On 6 weeks of IV antibiotics (vancomycin) and PICC line who presents with severe right-sided headache/fever of 103 at home and now found to have fungemia.  1. Fungemia/CANDIDEMIA: This is from PICC line which has been removed.  Continue ORAL Diflucan.  ID consult appreciated F/u on repeat cultures to assure clearance.   2. Right-sided headache: This is thought to be due to chronic headaches. MRI showed no abscess or new findings.  3. Recent Skull osteomyelitis status post removal of craniotomy flap and 6 weeks of antibiotics: Prior cx with MRSA and Klebsiella. She has no IV access Continue Linezolid and KEFLEX as per ID   4. COPD: No signs of exacerbation.  5. Seizure disorder: Continue Dilantin and Keppra  6. Chronic pain: Continue gabapentin.(TRAMADOL AND AMITRYPTILINE WITH DRUG INTERACTIONS WITH ANTIFUNGALS). Restart PRN oxycodone.  7. Anemia of chronic disease: stable at 7.5 Hgb.  PT consult today  Management plans discussed with the patient and she is in agreement.  CODE STATUS: full  TOTAL TIME TAKING CARE OF THIS PATIENT: 28 minutes.     POSSIBLE D/C 4 days, DEPENDING ON CLINICAL CONDITION.   Adajah Cocking M.D on 02/27/2016 at 11:15 AM  Between 7am to 6pm - Pager - [phone] After 6pm go to [email or web address] - password [email or web address] [place] [facility]  Office  [phone]  CC: Primary care physician; Pcp Not In System  Note: This dictation was prepared with Dragon dictation along with smaller phrase technology. Any transcriptional errors that result from this process are unintentional.

## 2016-02-27 NOTE — Plan of Care (Signed)
Problem: Safety: Goal: Ability to remain free from injury will improve Outcome: Not Progressing Patient had a fall to her knees this shift.  Patient is in disagreement with safety plan.  She refuses bedpan and yellow non-skid socks.  Patient's mother aided patient in leaving bed this shift.  Education on patient weakness and inability to support herself was given multiple times.

## 2016-02-27 NOTE — Progress Notes (Signed)
[facility] INFECTIOUS DISEASE PROGRESS NOTE Date of Admission:  02/24/2016     ID: [name] is a 60 y.o. female with skull osteomyelitis, candidemia Principal Problem:   Acute intractable headache Active Problems:   HTN (hypertension)   Diabetes mellitus, type 2 (HCC)   Osteomyelitis of skull (HCC)   Acute headache   Infection   Subjective: Not much more awake  ROS  Unable to obtain   Medications:  Antibiotics Given (last 72 hours)    Date/Time Action Medication Dose Rate   02/24/16 2021 Given   vancomycin (VANCOCIN) 1,500 mg in sodium chloride 0.9 % 500 mL IVPB 1,500 mg 250 mL/hr   02/25/16 0500 Given   meropenem (MERREM) 1 g in sodium chloride 0.9 % 100 mL IVPB 1 g 200 mL/hr   02/25/16 0544 Given   vancomycin (VANCOCIN) 1,250 mg in sodium chloride 0.9 % 250 mL IVPB 1,250 mg 166.7 mL/hr   02/25/16 1546 Given   meropenem (MERREM) 1 g in sodium chloride 0.9 % 100 mL IVPB 1 g 200 mL/hr   02/25/16 1642 Given   vancomycin (VANCOCIN) 1,250 mg in sodium chloride 0.9 % 250 mL IVPB 1,250 mg 166.7 mL/hr   02/25/16 2048 Given   meropenem (MERREM) 1 g in sodium chloride 0.9 % 100 mL IVPB 1 g 200 mL/hr   02/26/16 0524 Given   vancomycin (VANCOCIN) 1,250 mg in sodium chloride 0.9 % 250 mL IVPB 1,250 mg 166.7 mL/hr   02/26/16 [id number] Given   meropenem (MERREM) 1 g in sodium chloride 0.9 % 100 mL IVPB 1 g 200 mL/hr   02/26/16 2139 Given   cephALEXin (KEFLEX) capsule 500 mg 500 mg    02/26/16 2140 Given   linezolid (ZYVOX) tablet 600 mg 600 mg    02/27/16 0601 Given   cephALEXin (KEFLEX) capsule 500 mg 500 mg    02/27/16 [id number] Given   linezolid (ZYVOX) tablet 600 mg 600 mg    02/27/16 1426 Given   cephALEXin (KEFLEX) capsule 500 mg 500 mg      . albuterol  2.5 mg Nebulization BID  . amLODipine  10 mg Oral Daily  . cephALEXin  500 mg Oral Q8H  . docusate sodium  100 mg Oral BID  . famotidine  20 mg Oral BID  . fluconazole  400 mg Oral Daily  . furosemide  20 mg Oral  BH-q7a  . gabapentin  600 mg Oral BID  . heparin  5,000 Units Subcutaneous Q8H  . insulin aspart  0-9 Units Subcutaneous TID WC  . levETIRAcetam  500 mg Oral BID  . linezolid  600 mg Oral Q12H  . lipase/protease/amylase  24,000 Units Oral TID WC  . mometasone-formoterol  2 puff Inhalation BID  . nystatin ointment   Topical BID  . pantoprazole  40 mg Oral Daily  . phenytoin  300 mg Oral BID  . spironolactone  50 mg Oral BID  . tiotropium  18 mcg Inhalation Daily    Objective: Vital signs in last 24 hours: Temp:  [98.1 F (36.7 C)-99.2 F (37.3 C)] 98.6 F (37 C) (08/22 1435) Pulse Rate:  [84-91] 84 (08/22 1435) Resp:  [17-22] 20 (08/22 1435) BP: (100-117)/(62-82) 112/66 (08/22 1435) SpO2:  [92 %-100 %] 93 % (08/22 1435) Weight:  [117.1 kg (258 lb 1.6 oz)] 117.1 kg (258 lb 1.6 oz) (08/22 0451) Constitutional: lethargic but arouses, minimally interactive, confused HENT: [place]/AT, PERRLA, no scleral icterus Skull bone defect on R but scars well healed  and no open wound or drainage Mouth/Throat: Oropharynx is clear and dry. No oropharyngeal exudate.  Cardiovascular: Normal rate, regular rhythm and normal heart sounds. Pulmonary/Chest: Effort normal and breath sounds normal. No respiratory distress.  has no wheezes.  Neck = supple, no nuchal rigidity PICC RUE WNL Abdominal: Soft. Bowel sounds are normal.  exhibits no distension. There is no tenderness.  Lymphadenopathy: no cervical adenopathy. No axillary adenopathy Neurological: lethargic,  Skin: Skin is warm and dry. No rash noted. No erythema.   Lab Results  Recent Labs  02/25/16 0400 02/26/16 0637 02/27/16 0019  WBC 4.1  --  4.0  HGB 7.5*  --  7.5*  HCT 24.7*  --  24.6*  NA 139 137 138  K 3.5 3.9 3.9  CL 112* 112* 110  CO2 23 21* 21*  BUN 6 7 7   CREATININE 0.78 0.76 0.83    Microbiology: Results for orders placed or performed during the hospital encounter of 02/24/16  Blood culture (routine x 2)     Status:  Abnormal (Preliminary result)   Collection Time: 02/24/16  3:56 PM  Result Value Ref Range Status   Specimen Description BLOOD RIGHT ARM  Final   Special Requests BOTTLES DRAWN AEROBIC AND ANAEROBIC  8CC  Final   Culture  Setup Time (A)  Final    YEAST IN BOTH AEROBIC AND ANAEROBIC BOTTLES CRITICAL RESULT CALLED TO, READ BACK BY AND VERIFIED WITH: LISA KLUTTZ AT 2100 02/25/16 SDR CRITICAL RESULT CALLED TO, READ BACK BY AND VERIFIED WITH: JASON ROBBINS @ 0002 ON 02/26/2016 BY CAF    Culture YEAST (A)  Final   Report Status PENDING  Incomplete  Urine culture     Status: Abnormal (Preliminary result)   Collection Time: 02/24/16  3:56 PM  Result Value Ref Range Status   Specimen Description URINE, RANDOM  Final   Special Requests NONE  Final   Culture >=100,000 COLONIES/mL ESCHERICHIA COLI (A)  Final   Report Status PENDING  Incomplete  Blood Culture ID Panel (Reflexed)     Status: Abnormal   Collection Time: 02/24/16  3:56 PM  Result Value Ref Range Status   Enterococcus species NOT DETECTED NOT DETECTED Final   Vancomycin resistance NOT DETECTED NOT DETECTED Final   Listeria monocytogenes NOT DETECTED NOT DETECTED Final   Staphylococcus species NOT DETECTED NOT DETECTED Final   Staphylococcus aureus NOT DETECTED NOT DETECTED Final   Methicillin resistance NOT DETECTED NOT DETECTED Final   Streptococcus species NOT DETECTED NOT DETECTED Final   Streptococcus agalactiae NOT DETECTED NOT DETECTED Final   Streptococcus pneumoniae NOT DETECTED NOT DETECTED Final   Streptococcus pyogenes NOT DETECTED NOT DETECTED Final   Acinetobacter baumannii NOT DETECTED NOT DETECTED Final   Enterobacteriaceae species NOT DETECTED NOT DETECTED Final   Enterobacter cloacae complex NOT DETECTED NOT DETECTED Final   Escherichia coli NOT DETECTED NOT DETECTED Final   Klebsiella oxytoca NOT DETECTED NOT DETECTED Final   Klebsiella pneumoniae NOT DETECTED NOT DETECTED Final   Proteus species NOT DETECTED  NOT DETECTED Final   Serratia marcescens NOT DETECTED NOT DETECTED Final   Carbapenem resistance NOT DETECTED NOT DETECTED Final   Haemophilus influenzae NOT DETECTED NOT DETECTED Final   Neisseria meningitidis NOT DETECTED NOT DETECTED Final   Pseudomonas aeruginosa NOT DETECTED NOT DETECTED Final   Candida albicans NOT DETECTED NOT DETECTED Final   Candida glabrata NOT DETECTED NOT DETECTED Final   Candida krusei NOT DETECTED NOT DETECTED Final   Candida parapsilosis DETECTED (  A) NOT DETECTED Final    Comment: CRITICAL RESULT CALLED TO, READ BACK BY AND VERIFIED WITH: CALLED TO LISA KLUTTZ @ 2100 ON 02/25/2016 BY SDR/CAF    Candida tropicalis NOT DETECTED NOT DETECTED Final  Blood culture (routine x 2)     Status: None (Preliminary result)   Collection Time: 02/24/16  4:54 PM  Result Value Ref Range Status   Specimen Description BLOOD RIGHT ASSIST CONTROL  Final   Special Requests BOTTLES DRAWN AEROBIC AND ANAEROBIC 1CC  Final   Culture NO GROWTH 3 DAYS  Final   Report Status PENDING  Incomplete  MRSA PCR Screening     Status: None   Collection Time: 02/24/16  8:00 PM  Result Value Ref Range Status   MRSA by PCR NEGATIVE NEGATIVE Final    Comment:        The GeneXpert MRSA Assay (FDA approved for NASAL specimens only), is one component of a comprehensive MRSA colonization surveillance program. It is not intended to diagnose MRSA infection nor to guide or monitor treatment for MRSA infections.   Culture, blood (Routine X 2) w Reflex to ID Panel     Status: None (Preliminary result)   Collection Time: 02/27/16 12:50 AM  Result Value Ref Range Status   Specimen Description BLOOD RIGHT FINGER  Final   Special Requests BOTTLES DRAWN AEROBIC AND ANAEROBIC 3ML 5ML  Final   Culture NO GROWTH < 12 HOURS  Final   Report Status PENDING  Incomplete  Culture, blood (Routine X 2) w Reflex to ID Panel     Status: None (Preliminary result)   Collection Time: 02/27/16  2:58 AM  Result  Value Ref Range Status   Specimen Description BLOOD LEFT FINGER  Final   Special Requests   Final    BOTTLES DRAWN AEROBIC AND ANAEROBIC AER 5ML ANA .5ML   Culture NO GROWTH < 12 HOURS  Final   Report Status PENDING  Incomplete    Studies/Results: Dg Chest 2 View  Result Date: 02/26/2016 CLINICAL DATA:  Subjective fever, headache EXAM: CHEST  2 VIEW COMPARISON:  02/24/2016 FINDINGS: Low lung volumes. Mild right basilar opacity, likely atelectasis. No pleural effusion or pneumothorax. The heart is normal in size Visualized osseous structures are within normal limits. IMPRESSION: Mild right basilar opacity, likely atelectasis. No evidence of acute cardiopulmonary disease. Electronically Signed   By: Julian Hy M.D.   On: 02/26/2016 18:26   Mr [name] [id number] Contrast  Result Date: 02/26/2016 CLINICAL DATA:  60 year old female with prior craniotomies and craniectomies, including for meningioma more than 10 years ago. And more recently in February this year right side subdural hematoma evacuation with subsequent cranioplasty and ultimately craniectomy for osteomyelitis and wound debridement. Headache. Initial encounter. EXAM: MRI HEAD WITHOUT AND WITH CONTRAST TECHNIQUE: Multiplanar, multiecho pulse sequences of the brain and surrounding structures were obtained without and with intravenous contrast. CONTRAST:  64mL MULTIHANCE GADOBENATE DIMEGLUMINE 529 MG/ML IV SOLN COMPARISON:  Head CTs without contrast 02/24/2016 and earlier. FINDINGS: Study is intermittently, sometimes rather severely degraded by motion artifact despite repeated imaging attempts. Major intracranial vascular flow voids appear stable since the most recent MRI in 2013. Chronic left anterior temporal and left inferior frontal gyral encephalomalacia is stable since that time. Chronic left frontotemporal craniotomy changes are stable. More recent right superior and lateral craniectomy postoperative changes. No definite dural thickening.  No extra-axial collection identified. Bone marrow signal has diffusely decreased throughout the skull since 2013. Post-contrast images are degraded by  motion but no definite enhancing or destructive skull lesion is identified. No restricted diffusion to suggest acute infarction. No midline shift, mass effect, evidence of mass lesion, ventriculomegaly, extra-axial collection or acute intracranial hemorrhage. Cervicomedullary junction within normal limits. Partially empty sella is chronic. No new signal abnormality in the brain compared to 2013. No abnormal parenchymal enhancement is evident. Grossly normal visualized internal auditory structures. Visualized paranasal sinuses and mastoids are stable and well pneumatized. IMPRESSION: 1. Degraded by motion despite repeated imaging attempts. 2. Chronic left frontotemporal craniotomy and more recent right superior craniectomy postoperative changes with no acute no acute intracranial abnormality identified. 3. Diffusely decreased bone marrow signal since 2013 is nonspecific. Query secondary to anemia or other chronic disease. No definite destructive skull lesion. Electronically Signed   By: Genevie Ann M.D.   On: 02/26/2016 11:20    Assessment/Plan: [name] is a 60 y.o. female s/p craniotomy in March 0000000 complicated by osteomyelitis, s/p craniotomy and removal of flap and hardware with cultures growing MRSA, Klebsiella and Peptostreptococcus now with picc associated Candidemia.  Picc removed 8/22 UCX E coli sensis pending, ua only 6-30 wbc  Recommendations Candidemia - PICC associated- C. Parapsilosis  Pending echo  Ophtho consult  FU bcx 8/22 pending Cont  oral fluconazole since IV access lost Plan on 2 week course from 8/22 if cx negative - stop date Sept 5th   Skull osteomyelitis - unclear if still active. Prior cx with MRSA and Klebsiella. (but the MRSA was R clinda and doxy and she is bactrim allergic). Will plan on another 2 week oral course for  this - cont linezolid for MRSA  -  keflex for the Klebsiella  With the linezolid it can suppress bone marrow, hgb already low at 7.5 and plts 130 Would check cbc q 3 days while on linezolid Would consider transfusion as can expect hgb to drop further  Stop date for these will be Sept 5th   Thank you very much for the consult. Will follow with you.  FITZGERALD, DAVID P   02/27/2016, 3:21 PM

## 2016-02-27 NOTE — Care Management Important Message (Signed)
Important Message  Patient Details  Name: [name] MRN: [id number] Date of Birth: 01-24-1956   Medicare Important Message Given:  Yes    Shelbie Ammons, RN 02/27/2016, 7:40 AM

## 2016-02-28 LAB — GLUCOSE, CAPILLARY
GLUCOSE-CAPILLARY: 108 mg/dL — AB (ref 65–99)
Glucose-Capillary: 111 mg/dL — ABNORMAL HIGH (ref 65–99)
Glucose-Capillary: 96 mg/dL (ref 65–99)

## 2016-02-28 LAB — CULTURE, BLOOD (ROUTINE X 2)

## 2016-02-28 LAB — PREPARE RBC (CROSSMATCH)

## 2016-02-28 LAB — HEMOGLOBIN: HEMOGLOBIN: 8.1 g/dL — AB (ref 12.0–16.0)

## 2016-02-28 MED ORDER — SODIUM CHLORIDE 0.9 % IV SOLN: Freq: Once | INTRAVENOUS | Status: DC

## 2016-02-28 MED ORDER — FLUCONAZOLE 200 MG PO TABS: 400.0000 mg | ORAL_TABLET | Freq: Every day | ORAL | 0 refills | Status: AC

## 2016-02-28 MED ORDER — FUROSEMIDE 10 MG/ML IJ SOLN
20.0000 mg | Freq: Once | INTRAMUSCULAR | Status: AC
  Administered 2016-02-28: 11:00:00 20 mg via INTRAVENOUS
  Filled 2016-02-28: qty 2

## 2016-02-28 MED ORDER — CEPHALEXIN 500 MG PO CAPS: 500.0000 mg | ORAL_CAPSULE | Freq: Three times a day (TID) | ORAL | 0 refills | Status: AC

## 2016-02-28 MED ORDER — NITROFURANTOIN MONOHYD MACRO 100 MG PO CAPS: 100.0000 mg | ORAL_CAPSULE | Freq: Two times a day (BID) | ORAL | 0 refills | Status: AC

## 2016-02-28 MED ORDER — ZOLPIDEM TARTRATE 5 MG PO TABS: 5.0000 mg | ORAL_TABLET | Freq: Every evening | ORAL | 0 refills | Status: DC | PRN

## 2016-02-28 MED ORDER — OXYCODONE HCL 15 MG PO TABS: 7.5000 mg | ORAL_TABLET | Freq: Two times a day (BID) | ORAL | 0 refills | Status: DC | PRN

## 2016-02-28 MED ORDER — NITROFURANTOIN MONOHYD MACRO 100 MG PO CAPS
100.0000 mg | ORAL_CAPSULE | Freq: Two times a day (BID) | ORAL | Status: DC
  Administered 2016-02-28 – 2016-02-29 (×3): 100 mg via ORAL
  Filled 2016-02-28 (×3): qty 1

## 2016-02-28 MED ORDER — LINEZOLID 600 MG PO TABS: 600.0000 mg | ORAL_TABLET | Freq: Two times a day (BID) | ORAL | 0 refills | Status: AC

## 2016-02-28 NOTE — Evaluation (Signed)
Physical Therapy Evaluation Patient Details Name: [name] MRN: [id number] DOB: 04/03/1956 Today's Date: 02/28/2016   History of Present Illness  Pt admitted 8/19 with acute intractable R sided HA and reported fever of 103, she has osteomyelitis of the skull and medical Hx of chronic Px, drug/alcohol abuse with cirrhosis, HTN, DM, and Hx of falls  Clinical Impression  [name] is a 60 y/o female. She presents with limited flat affect and low level of arousal requiring continuous prompting to stay awake. She is cooperative when stimulated, but requires continuous stimulation to maintain focus. Pt has L UE weakness 3+/5 and otherwise generalized weakness 4/5. Pt's mother was present and insisted that the pt's low level of arousal was due to sedatives. Pt was mod assist with bed mobility, but showed good trunk control and sitting balance at EOB. She stood impulsively with min effort as PT donned gait belt, but after sitting back down she was mod assist to stand again. In standing pt had moderate posterior lean, which was somewhat improved with cuing. Due to distraction and lethargy pt was mod assist requiring extensive cuing and manual assist to focus, utilize RW, and initiate gait. Pt had impaired safety awareness believing she could walk to the bathroom on her own. Pt is appropriate for skilled PT at this time to address deficits in strength, balance, coordination, endurance, gait, safety awareness, and safe use of DME. Recommend SNF do to pt's need for skilled instruction and guarding for functional mobility.      Follow Up Recommendations SNF    Equipment Recommendations  None recommended by PT    Recommendations for Other Services       Precautions / Restrictions Precautions Precautions: Fall Precaution Comments: severely impaired safety awareness Restrictions Weight Bearing Restrictions: No      Mobility  Bed Mobility Overal bed mobility: Needs Assistance Bed Mobility:  Supine to Sit     Supine to sit: Mod assist     General bed mobility comments: Pt required assist to scoot to EOB and to bring LE off EOB, she has good trunk control when she is enguaged, needs extensive cuing to maintain focus on task but she was cooperative  Transfers Overall transfer level: Needs assistance Equipment used: Rolling walker (2 wheeled) Transfers: Sit to/from Stand Sit to Stand: Min assist         General transfer comment: Pt stands impulsively, but with min-mod effort. Requires cuing for safe use of RW and general safety awareness. Moderate posterior lean with standing  Ambulation/Gait Ambulation/Gait assistance: Mod assist Ambulation Distance (Feet): 3 Feet Assistive device: Rolling walker (2 wheeled) Gait Pattern/deviations: Shuffle;Leaning posteriorly;Wide base of support   Gait velocity interpretation: <1.8 ft/sec, indicative of risk for recurrent falls General Gait Details: Pt requies mod assist to prevent posterior fall due to continual posterior lean. She responds to cuing when she is able to focus, which is intermittantly. Pt has slow shuffle gait and requied manual assist for weight shift and step initiation due pimaily to low level of alertness   Stairs            Wheelchair Mobility    Modified Rankin (Stroke Patients Only)       Balance Overall balance assessment: Needs assistance Sitting-balance support: Feet supported;No upper extremity supported Sitting balance-Leahy Scale: Good Sitting balance - Comments: Pt maintained balance in sitting and resisted perturbation Postural control: Posterior lean (primarily in standing) Standing balance support: Bilateral upper extremity supported Standing balance-Leahy Scale: Poor Standing balance comment:  moderate posterior lean, patient lost focus on task and attempted to adjust her brief, letting go of walker and leaning posteiorly; no evidence of safety awaeness                              Pertinent Vitals/Pain Pain Assessment: No/denies pain    Home Living Family/patient expects to be discharged to:: Private residence Living Arrangements: Spouse/significant other Available Help at Discharge: Family Type of Home: House Home Access: Level entry     Home Layout: One [place]: Environmental consultant - 2 wheels;Cane - single point Additional Comments: Per pt's mother    Prior Function Level of Independence:  (unsure, pt's level of arousal limited interview)      ADL's / Homemaking Assistance Needed: independent per chart review  Comments: Unsure of accuracy of home living and PLOF information - pt is poor historian     Hand Dominance   Dominant Hand: Right    Extremity/Trunk Assessment   Upper Extremity Assessment: Generalized weakness;LUE deficits/detail       LUE Deficits / Details: L UE generally 3+/5, unsure if pt gave best effort   Lower Extremity Assessment: Generalized weakness      Cervical / Trunk Assessment: Normal  Communication   Communication: No difficulties  Cognition Arousal/Alertness: Lethargic;Suspect due to medications Behavior During Therapy: Flat affect;Impulsive Overall Cognitive Status: Difficult to assess                      General Comments      Exercises Other Exercises Other Exercises: Therapeutic activity for transfe to chair including cuing for safe technique with use of RW with feedback on standing balance with verbal cues to correct, verbal and tactile cues with manual assist for gait sequence and weight shift, and cuing fo safe technique for controlled stand to sit in chair.       Assessment/Plan    PT Assessment Patient needs continued PT services  PT Diagnosis Difficulty walking;Abnormality of gait;Generalized weakness   PT Problem List Decreased strength;Decreased range of motion;Decreased activity tolerance;Decreased balance;Decreased mobility;Decreased coordination;Decreased cognition;Decreased  knowledge of use of DME;Decreased safety awareness;Decreased knowledge of precautions;Obesity  PT Treatment Interventions DME instruction;Gait training;Stair training;Functional mobility training;Therapeutic activities;Therapeutic exercise;Balance training;Neuromuscular re-education;Cognitive remediation   PT Goals (Current goals can be found in the Care Plan section) Acute Rehab PT Goals Patient Stated Goal: get lunch, no other stated PT Goal Formulation: With patient Time For Goal Achievement: 03/13/16 Potential to Achieve Goals: Fair    Frequency Min 2X/week   Barriers to discharge        Co-evaluation               End of Session Equipment Utilized During Treatment: Gait belt Activity Tolerance: Patient limited by lethargy;Patient limited by fatigue Patient left: in chair;with call bell/phone within reach;with chair alarm set;with family/visitor present Nurse Communication: Mobility status         Time: 0935-1001 PT Time Calculation (min) (ACUTE ONLY): 26 min   Charges:         PT G Codes:        Wendell Nicoson 2016/03/05, 11:05 AM  Burnett Corrente, SPT [phone]

## 2016-02-28 NOTE — NC FL2 (Signed)
[place] LEVEL OF CARE SCREENING TOOL     IDENTIFICATION  Patient Name: [name] Birthdate: [birth year] Sex: female Admission Date (Current Location): 02/24/2016  [facility], [facility] and [place] Number:  [name]  ([id number] [facility]) Facility and Address:  [facility], [address] [place], [place] [address]      Provider Number: [id number]  Attending Physician Name and Address:  Bettey Costa, MD  Relative Name and Phone Number:       Current Level of Care: Hospital Recommended Level of Care: [place] Prior Approval Number:    Date Approved/Denied:   PASRR Number:  ( [id number] )  Discharge Plan: SNF    Current Diagnoses: Patient Active Problem List   Diagnosis Date Noted  . Infection 02/26/2016  . Acute intractable headache 02/24/2016  . Acute headache 02/24/2016  . Osteomyelitis of skull ([place])   . Encephalopathy   . Status post craniectomy 12/28/2015  . Wound infection after surgery 12/26/2015  . UTI (lower urinary tract infection) 10/22/2015  . Chest pain on breathing   . SOB (shortness of breath)   . Wheezing   . Subdural hematoma ([place]) 08/16/2015  . Sepsis ([place]) 06/22/2015  . Orthostatic hypotension 05/15/2015  . ALC (alcoholic liver cirrhosis) ([place]) 12/23/2014  . Clinical depression 12/23/2014  . Anxiety 12/23/2014  . Diabetes mellitus, type 2 ([place]) 12/23/2014  . HTN (hypertension) 11/05/2014  . Liver cirrhosis ([place]) 07/19/2014  . 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 Chronic pancreatitis ([place]) 11/03/2012  . Seizure disorder ([place]) 11/03/2012  . Benign meningioma of brain ([place]) 11/03/2012  . Chronic pelvic pain in female 11/03/2012  . Hepatitis C virus infection without hepatic coma 11/03/2012  . UTI (urinary tract infection) 11/03/2012  . Alcohol abuse, unspecified 11/03/2012  . Chronic liver disease 11/03/2012  . Schizophrenia ([place]) 11/03/2012  . Narcotic abuse 11/03/2012  . 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 Breast screening 11/03/2012  . 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 Nondependent alcohol abuse 11/03/2012  . 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 Female genital symptoms 11/03/2012    Orientation RESPIRATION BLADDER Height & Weight     Self, Time, Situation, Place  Normal Incontinent Weight: 256 lb 1.6 oz (116.2 kg) Height:  5\' 8"  (172.7 cm)  BEHAVIORAL SYMPTOMS/MOOD NEUROLOGICAL BOWEL NUTRITION STATUS   (none) Convulsions/Seizures (History of Seizures. ) Incontinent Diet (Diet: 2 Gram Sodium. )  AMBULATORY STATUS COMMUNICATION OF NEEDS Skin   Extensive Assist Verbally Normal                       Personal Care Assistance Level of Assistance  Bathing, Feeding, Dressing Bathing Assistance: Limited assistance Feeding assistance: Independent Dressing Assistance: Limited assistance     Functional Limitations Info  Sight, Hearing, Speech Sight Info: Adequate Hearing Info: Adequate Speech Info: Adequate    SPECIAL CARE FACTORS FREQUENCY  PT (By licensed PT)     PT Frequency:  (5)              Contractures      Additional Factors Info  Code Status, Allergies, Insulin Sliding Scale, Isolation Precautions Code Status Info:  (Full Code. ) Allergies Info:  (Ibuprofen, Sulfa Antibiotics, Hydroxyzine, Amoxicillin, Chocolate, Ciprofloxacin, Dilaudid Hydromorphone Hcl, Fentanyl, Hydromorphone, Metformin, Penicillins, Strawberry Extract, Zofran Ondansetron Hcl)   Insulin Sliding Scale Info:  (NovoLog Insulin Injections 3 times per day. ) Isolation Precautions Info:  (History of ESBL and MRSA )  Current Medications (02/28/2016):  This is the current hospital active medication list Current Facility-Administered Medications  Medication Dose Route Frequency Provider Last Rate Last Dose  . 0.9 %  sodium chloride infusion   Intravenous Once Sital Mody, MD      . 0.9 % NaCl with KCl 40 mEq /  L  infusion   Intravenous Continuous Idelle Crouch, MD 75 mL/hr at 02/28/16 0044 75 mL/hr at 02/28/16 0044  . acetaminophen (TYLENOL) tablet 650 mg  650 mg Oral Q6H PRN Idelle Crouch, MD   650 mg at 02/27/16 [id number]   Or  . acetaminophen (TYLENOL) suppository 650 mg  650 mg Rectal Q6H PRN Idelle Crouch, MD      . albuterol (PROVENTIL) (2.5 MG/3ML) 0.083% nebulizer solution 2.5 mg  2.5 mg Nebulization BID Idelle Crouch, MD   2.5 mg at 02/28/16 [id number]  . albuterol (PROVENTIL) (2.5 MG/3ML) 0.083% nebulizer solution 3 mL  3 mL Inhalation Q6H PRN Idelle Crouch, MD      . ALPRAZolam Duanne Moron) tablet 1 mg  1 mg Oral BID PRN Idelle Crouch, MD   1 mg at 02/27/16 2106  . amLODipine (NORVASC) tablet 10 mg  10 mg Oral Daily Idelle Crouch, MD   10 mg at 02/28/16 1128  . bisacodyl (DULCOLAX) suppository 10 mg  10 mg Rectal Daily PRN Idelle Crouch, MD      . cephALEXin [facility]) capsule 500 mg  500 mg Oral Q8H Leonel Ramsay, MD   500 mg at 02/28/16 0536  . diphenhydrAMINE (BENADRYL) capsule 25 mg  25 mg Oral Q8H PRN Idelle Crouch, MD   25 mg at 02/24/16 1832  . docusate sodium (COLACE) capsule 100 mg  100 mg Oral BID Idelle Crouch, MD   100 mg at 02/28/16 1127  . famotidine (PEPCID) tablet 20 mg  20 mg Oral BID Bettey Costa, MD   20 mg at 02/28/16 1127  . fluconazole (DIFLUCAN) tablet 400 mg  400 mg Oral Daily Leonel Ramsay, MD   400 mg at 02/27/16 2106  . furosemide (LASIX) tablet 20 mg  20 mg Oral BH-q7a Idelle Crouch, MD   20 mg at 02/28/16 0536  . gabapentin (NEURONTIN) capsule 600 mg  600 mg Oral BID Idelle Crouch, MD   600 mg at 02/28/16 1127  . heparin injection 5,000 Units  5,000 Units Subcutaneous Q8H Idelle Crouch, MD   5,000 Units at 02/28/16 0536  . insulin aspart (novoLOG) injection 0-9 Units  0-9 Units Subcutaneous TID WC Idelle Crouch, MD   1 Units at 02/26/16 [phone]  . levETIRAcetam (KEPPRA) tablet 500 mg  500 mg Oral BID Idelle Crouch, MD   500 mg at  02/28/16 1128  . linezolid (ZYVOX) tablet 600 mg  600 mg Oral Q12H Leonel Ramsay, MD   600 mg at 02/28/16 1127  . lipase/protease/amylase (CREON) capsule 24,000 Units  24,000 Units Oral TID WC Idelle Crouch, MD   24,000 Units at 02/28/16 1127  . mometasone-formoterol (DULERA) 200-5 MCG/ACT inhaler 2 puff  2 puff Inhalation BID Idelle Crouch, MD   2 puff at 02/28/16 1128  . nitrofurantoin (macrocrystal-monohydrate) (MACROBID) capsule 100 mg  100 mg Oral Q12H Sital Mody, MD   100 mg at 02/28/16 1128  . nystatin ointment (MYCOSTATIN)   Topical BID Idelle Crouch, MD      . oxyCODONE (Oxy IR/ROXICODONE) immediate release tablet 15 mg  15 mg Oral BID PRN Bettey Costa, MD   15 mg at 02/28/16 1202  . pantoprazole (PROTONIX) EC tablet 40 mg  40 mg Oral Daily Idelle Crouch, MD   40 mg at 02/28/16 1128  . phenytoin (DILANTIN) 125 MG/5ML suspension 300 mg  300 mg Oral BID Idelle Crouch, MD   300 mg at 02/28/16 1128  . prochlorperazine (COMPAZINE) tablet 10 mg  10 mg Oral Q12H PRN Idelle Crouch, MD      . promethazine (PHENERGAN) tablet 12.5 mg  12.5 mg Oral Q6H PRN Idelle Crouch, MD      . promethazine (PHENERGAN) tablet 25 mg  25 mg Oral Q8H PRN Idelle Crouch, MD   25 mg at 02/27/16 0914  . spironolactone (ALDACTONE) tablet 50 mg  50 mg Oral BID Idelle Crouch, MD   50 mg at 02/28/16 1127  . tiotropium (SPIRIVA) inhalation capsule 18 mcg  18 mcg Inhalation Daily Bettey Costa, MD   18 mcg at 02/28/16 1128  . triamcinolone (NASACORT) nasal inhaler 2 spray  2 spray Nasal Daily PRN Idelle Crouch, MD      . zolpidem (AMBIEN) tablet 5 mg  5 mg Oral QHS PRN Bettey Costa, MD   5 mg at 02/27/16 2101     Discharge Medications: Please see discharge summary for a list of discharge medications.  Relevant Imaging Results:  Relevant Lab Results:   Additional Information  (SSN: [id number])  Cordarryl Monrreal, Veronia Beets, LCSW

## 2016-02-28 NOTE — Progress Notes (Signed)
[place] [facility] [place] NAME: [name]    MR#:  [id number]  DATE OF BIRTH:  20-Oct-1955  SUBJECTIVE:   no acute issues REVIEW OF SYSTEMS:    Review of Systems  Constitutional: Negative for chills, fever and malaise/fatigue.  HENT: Negative.  Negative for ear discharge, ear pain, hearing loss, nosebleeds and sore throat.   Eyes: Negative.  Negative for blurred vision and pain.  Respiratory: Negative.  Negative for cough, hemoptysis, shortness of breath and wheezing.   Cardiovascular: Negative.  Negative for chest pain, palpitations and leg swelling.  Gastrointestinal: Negative.  Negative for abdominal pain, blood in stool, diarrhea, nausea and vomiting.  Genitourinary: Negative.  Negative for dysuria.  Musculoskeletal: Negative.  Negative for back pain.  Skin: Negative.   Neurological: Positive for weakness. Negative for dizziness, tremors, speech change, focal weakness, seizures and headaches.  Endo/Heme/Allergies: Negative.  Does not bruise/bleed easily.  Psychiatric/Behavioral: Negative.  Negative for depression, hallucinations and suicidal ideas.    Tolerating Diet:yes      DRUG ALLERGIES:   Allergies  Allergen Reactions  . Ibuprofen Other (See Comments)    Pt states that she is unable to take this medication because of her liver.    . Sulfa Antibiotics Hives  . Hydroxyzine Other (See Comments)    Causes crying   . Amoxicillin Itching, Rash and Other (See Comments)    Has patient had a PCN reaction causing immediate rash, facial/tongue/throat swelling, SOB or lightheadedness with hypotension: No Has patient had a PCN reaction causing severe rash involving mucus membranes or skin necrosis: No Has patient had a PCN reaction that required hospitalization No Has patient had a PCN reaction occurring within the last 10 years: Yes If all of the above answers are "NO", then may proceed with Cephalosporin use.  . Chocolate Rash  .  Ciprofloxacin Itching and Rash  . Dilaudid [Hydromorphone Hcl] Itching  . Fentanyl Rash  . Hydromorphone Itching  . Metformin Nausea And Vomiting  . Penicillins Itching, Rash and Other (See Comments)    Has patient had a PCN reaction causing immediate rash, facial/tongue/throat swelling, SOB or lightheadedness with hypotension: No Has patient had a PCN reaction causing severe rash involving mucus membranes or skin necrosis: No Has patient had a PCN reaction that required hospitalization No Has patient had a PCN reaction occurring within the last 10 years: Yes If all of the above answers are "NO", then may proceed with Cephalosporin use.  . Strawberry Extract Rash  . Zofran [Ondansetron Hcl] Itching and Nausea And Vomiting    VITALS:  Blood pressure 112/73, pulse 83, temperature 97.9 F (36.6 C), temperature source Oral, resp. rate 20, height 5\' 8"  (1.727 m), weight 116.2 kg (256 lb 1.6 oz), SpO2 95 %.  PHYSICAL EXAMINATION:   Physical Exam  Constitutional: She is oriented to person, place, and time and well-developed, well-nourished, and in no distress. No distress.  HENT:  Head: Normocephalic.  Eyes: No scleral icterus.  Neck: Normal range of motion. Neck supple. No JVD present. No tracheal deviation present.  Cardiovascular: Normal rate, regular rhythm and normal heart sounds.  Exam reveals no gallop and no friction rub.   No murmur heard. Pulmonary/Chest: Effort normal and breath sounds normal. No respiratory distress. She has no wheezes. She has no rales. She exhibits no tenderness.  Abdominal: Soft. Bowel sounds are normal. She exhibits no distension and no mass. There is tenderness (diffuse). There is no rebound and no guarding.  Musculoskeletal: Normal range of motion. She exhibits no edema.  Neurological: She is alert and oriented to person, place, and time.  Skin: Skin is warm. No rash noted. No erythema.  Psychiatric: Affect and judgment normal.      LABORATORY PANEL:    CBC  Recent Labs Lab 02/27/16 0019 02/28/16 0753  WBC 4.0  --   HGB 7.5* 8.1*  HCT 24.6*  --   PLT 130*  --    ------------------------------------------------------------------------------------------------------------------  Chemistries   Recent Labs Lab 02/25/16 0400  02/27/16 0019  NA 139  < > 138  K 3.5  < > 3.9  CL 112*  < > 110  CO2 23  < > 21*  GLUCOSE 97  < > 155*  BUN 6  < > 7  CREATININE 0.78  < > 0.83  CALCIUM 8.0*  < > 8.8*  AST 59*  --   --   ALT 27  --   --   ALKPHOS 208*  --   --   BILITOT 1.0  --   --   < > = values in this interval not displayed. ------------------------------------------------------------------------------------------------------------------  Cardiac Enzymes No results for input(s): TROPONINI in the last 168 hours. ------------------------------------------------------------------------------------------------------------------  RADIOLOGY:  Dg Chest 2 View  Result Date: 02/26/2016 CLINICAL DATA:  Subjective fever, headache EXAM: CHEST  2 VIEW COMPARISON:  02/24/2016 FINDINGS: Low lung volumes. Mild right basilar opacity, likely atelectasis. No pleural effusion or pneumothorax. The heart is normal in size Visualized osseous structures are within normal limits. IMPRESSION: Mild right basilar opacity, likely atelectasis. No evidence of acute cardiopulmonary disease. Electronically Signed   By: Julian Hy M.D.   On: 02/26/2016 18:26     ASSESSMENT AND PLAN:    60 year old female with complicated medical history including right hemispheric subdural hematoma evacuation and skull  osteomyelitis  On 6 weeks of IV antibiotics (vancomycin) and PICC line who presents with severe right-sided headache/fever of 103 at home and now found to have fungemia.  1. Fungemia/CANDIDEMIA: This is from PICC line which has been removed.  She will Continue ORAL Diflucan as per ID. F/u on repeat culturesare negative.   2. Right-sided  headache: This is thought to be due to chronic headaches. MRI showed no abscess or new findings. She Has a history of chronic narcotic abuse and therefore heavy dose of narcotics were avoided.  3. Recent Skull osteomyelitis status post removal of craniotomy flap and 6 weeks of antibiotics: Prior cx with MRSA and Klebsiella. As per ID consult she will continue Linezolid and KEFLEX until September 5. She will need CBC every 3 days while on linezolid.   4. COPD: No signs of exacerbation.  5. Seizure disorder: Continue Dilantin and Keppra  6. Chronic pain: Continue gabapentin.(TRAMADOL AND AMITRYPTILINE WITH DRUG INTERACTIONS WITH ANTIFUNGALS). Low-dose oxycodone. 7. Anemia of chronic disease: Dr. Ola Spurr recommended transfusing 1 unit of blood due to her anemia of chronic disease and use of linezolid which can cause anemia. She was transfused 1 unit due to being discharged on linezolid. She was hemodynamically stable. Hemoglobin was 8.1 and has remained stable. PT consult [facility]  Management plans discussed with the patient and she is in agreement.  CODE STATUS: full  TOTAL TIME TAKING CARE OF THIS PATIENT: 28 minutes.   D/w csw and patient's mom  POSSIBLE D/C today DEPENDING ON CLINICAL CONDITION.   Shyteria Lewis M.D on 02/28/2016 at 12:26 PM  Between 7am to 6pm - Pager - [phone] After 6pm go  to [email or web address] - password [email or web address] [place] [facility]  Office  [phone]  CC: Primary care physician; Pcp Not In System  Note: This dictation was prepared with Dragon dictation along with smaller phrase technology. Any transcriptional errors that result from this process are unintentional.

## 2016-02-28 NOTE — Progress Notes (Signed)
Pt IV access lost when pt tried getting out of bed without assistance. Bed alarm sounded, staff went in to find pt on side of bed with IV out. Dr. Benjie Karvonen notified, asked for [place] to try a new IV, day shift was unsuccessful. Night shift agreeable to try again as well.

## 2016-02-28 NOTE — Consult Note (Signed)
  Psychiatry: Consult received. I reviewed the chart and the issues at hand. Came by to see the patient. Patient was asleep. She woke up when I started speaking to her but could not keep her eyes open or engage in conversation. Not sure whether this was volitional or not. In any case I was not able to have a conversation enough to address the issues. I will follow-up tomorrow if she is still in the hospital.

## 2016-02-28 NOTE — Discharge Summary (Addendum)
[facility] [place] NAME: [name]    MR#:  [id number]  DATE OF BIRTH:  [birth year]  DATE OF ADMISSION:  02/24/2016 ADMITTING PHYSICIAN: Idelle Crouch, MD  DATE OF DISCHARGE: 02/29/2016  PRIMARY CARE PHYSICIAN: Pcp Not In System    ADMISSION DIAGNOSIS:  Headache [R51] Intractable headache, unspecified chronicity pattern, unspecified headache type [R51]  DISCHARGE DIAGNOSIS:  Principal Problem:   Organic dementia Active Problems:   HTN (hypertension)   Diabetes mellitus, type 2 (HCC)   Osteomyelitis of skull (HCC)   Acute intractable headache   Acute headache   Infection   SECONDARY DIAGNOSIS:   Past Medical History:  Diagnosis Date  . Allergy   . Chronic bronchitis ([place])   . Chronic kidney disease   . Cirrhosis ([place])   . Depression   . Diabetes mellitus without complication ([place])   . Diverticulitis   . Emphysema of lung ([place])   . GERD (gastroesophageal reflux disease)   . H/O drug abuse    Clean since 2009  . H/O ETOH abuse    Sober since 2009  . Hepatitis C   . Hypertension   . Malignant brain tumor ([place]) 2007  . Migraines   . Pancreatic ascites   . Pancreatitis, alcoholic 9470  . Rectovaginal fistula   . Seizures ([place])   . Stroke [facility]) 2007   during brain surgery  . Urine incontinence     HOSPITAL COURSE:    60 year old female with complicated medical history including right hemispheric subdural hematoma evacuation and skull  osteomyelitis  On 6 weeks of IV antibiotics (vancomycin) and PICC line who presents with severe right-sided headache/fever of 103 at home and now found to have fungemia.  1. Fungemia/CANDIDEMIA: This is from PICC line which has been removed.  She will Continue ORAL Diflucan as per ID. F/u on repeat cultures are negative.   2. Right-sided headache: This is thought to be due to chronic headaches. MRI showed no abscess or new findings. She Has a history of chronic narcotic  abuse and therefore heavy dose of narcotics were avoided.  3. Recent Skull osteomyelitis status post removal of craniotomy flap and 6 weeks of antibiotics: Prior cx with MRSA and Klebsiella. As per ID consult she will continue Linezolid and KEFLEX until September 5. She will need CBC every 3 days while on linezolid.   4. COPD: No signs of exacerbation.  5. Seizure disorder: Continue Dilantin and Keppra  6. Chronic pain: Continue gabapentin.(TRAMADOL AND AMITRYPTILINE WITH DRUG INTERACTIONS WITH ANTIFUNGALS). Low-dose oxycodone. 7. Anemia of chronic disease: Dr. Ola Spurr recommended transfusing 1 unit of blood due to her anemia of chronic disease and use of linezolid which can cause anemia. She was transfused 1 unit due to being discharged on linezolid. She was hemodynamically stable. Hemoglobin was 9.2 at discharge.   8. Mental capacity: Psychiatry evaluated patient for mental capacity. She was deemed not able to make medical decisions. As per Dr. Lissa Hoard PACs no "Although there is a baseline presumption of capacity, when serious concerns have been raised by the treatment team to question of patient's capacity it is necessary to be able to demonstrate at least a basic capacity particularly when the potential outcome is possibly unsafe to the patient. In this case I have made multiple attempts to engage the patient in a conversation that could demonstrate her capacity. I have been unable to get her to engage in conversation. I do not know whether this is  because of sedating medication, underlying illness or a willful refusal on her part to engage. It is possible that the patient's mental state could change in the future and could allow for reassessment of capacity. However at this time it's appropriate to conclude that she LACKS CAPACITY to make decisions for her own care.?   DISCHARGE CONDITIONS AND DIET:   Regular diet Stable for discharge  CONSULTS OBTAINED:  Treatment Team:  Leotis Pain, MD Leonel Ramsay, MD Gonzella Lex, MD  DRUG ALLERGIES:   Allergies  Allergen Reactions  . Ibuprofen Other (See Comments)    Pt states that she is unable to take this medication because of her liver.    . Sulfa Antibiotics Hives  . Hydroxyzine Other (See Comments)    Causes crying   . Amoxicillin Itching, Rash and Other (See Comments)    Has patient had a PCN reaction causing immediate rash, facial/tongue/throat swelling, SOB or lightheadedness with hypotension: No Has patient had a PCN reaction causing severe rash involving mucus membranes or skin necrosis: No Has patient had a PCN reaction that required hospitalization No Has patient had a PCN reaction occurring within the last 10 years: Yes If all of the above answers are "NO", then may proceed with Cephalosporin use.  . Chocolate Rash  . Ciprofloxacin Itching and Rash  . Dilaudid [Hydromorphone Hcl] Itching  . Fentanyl Rash  . Hydromorphone Itching  . Metformin Nausea And Vomiting  . Penicillins Itching, Rash and Other (See Comments)    Has patient had a PCN reaction causing immediate rash, facial/tongue/throat swelling, SOB or lightheadedness with hypotension: No Has patient had a PCN reaction causing severe rash involving mucus membranes or skin necrosis: No Has patient had a PCN reaction that required hospitalization No Has patient had a PCN reaction occurring within the last 10 years: Yes If all of the above answers are "NO", then may proceed with Cephalosporin use.  . Strawberry Extract Rash  . Zofran [Ondansetron Hcl] Itching and Nausea And Vomiting    DISCHARGE MEDICATIONS:   Current Discharge Medication List    START taking these medications   Details  cephALEXin (KEFLEX) 500 MG capsule Take 1 capsule (500 mg total) by mouth 3 (three) times daily. Qty: 39 capsule, Refills: 0    fluconazole (DIFLUCAN) 200 MG tablet Take 2 tablets (400 mg total) by mouth daily. Qty: 26 tablet, Refills: 0     linezolid (ZYVOX) 600 MG tablet Take 1 tablet (600 mg total) by mouth every 12 (twelve) hours. Qty: 26 tablet, Refills: 0    nitrofurantoin, macrocrystal-monohydrate, (MACROBID) 100 MG capsule Take 1 capsule (100 mg total) by mouth 2 (two) times daily. Qty: 10 capsule, Refills: 0      CONTINUE these medications which have CHANGED   Details  oxyCODONE (ROXICODONE) 15 MG immediate release tablet Take 0.5 tablets (7.5 mg total) by mouth 2 (two) times daily as needed (pain mod sever). Qty: 20 tablet, Refills: 0    zolpidem (AMBIEN) 5 MG tablet Take 1 tablet (5 mg total) by mouth at bedtime as needed for sleep. Qty: 30 tablet, Refills: 0      CONTINUE these medications which have NOT CHANGED   Details  albuterol (PROVENTIL HFA;VENTOLIN HFA) 108 (90 Base) MCG/ACT inhaler Inhale 2 puffs into the lungs every 6 (six) hours as needed for wheezing or shortness of breath.    albuterol (PROVENTIL) (5 MG/ML) 0.5% nebulizer solution Take 2.5 mg by nebulization every 6 (six) hours as needed for  wheezing or shortness of breath.     ALPRAZolam (XANAX) 1 MG tablet Take 1 tablet (1 mg total) by mouth 2 (two) times daily as needed for anxiety. Qty: 90 tablet, Refills: 0    amLODipine (NORVASC) 10 MG tablet Take 10 mg by mouth daily.    Blood Glucose Monitoring Suppl (FIFTY50 GLUCOSE METER 2.0) w/Device KIT ICD-10 E11.9 Check blood sugars once daily    diphenhydrAMINE (BENADRYL) 25 mg capsule Take 1 capsule (25 mg total) by mouth every 8 (eight) hours as needed for itching. Qty: 30 capsule, Refills: 0    Fluticasone-Salmeterol (ADVAIR) 250-50 MCG/DOSE AEPB Inhale 1 puff into the lungs 2 (two) times daily.     furosemide (LASIX) 20 MG tablet Take 20 mg by mouth every morning.     gabapentin (NEURONTIN) 300 MG capsule Take 600 mg by mouth 2 (two) times daily.     Ipratropium-Albuterol (COMBIVENT) 20-100 MCG/ACT AERS respimat Inhale 2 puffs into the lungs every 6 (six) hours as needed for wheezing or  shortness of breath.     levETIRAcetam (KEPPRA) 500 MG tablet Take 500 mg by mouth 2 (two) times daily.     lipase/protease/amylase (CREON) 12000 units CPEP capsule Take 24,000 Units by mouth 3 (three) times daily with meals.    omeprazole (PRILOSEC) 20 MG capsule Take 20 mg by mouth every morning.     phenytoin (DILANTIN) 100 MG/4ML suspension Take 300 mg by mouth 2 (two) times daily.     promethazine (PHENERGAN) 25 MG tablet Take 25 mg by mouth every 8 (eight) hours as needed for nausea or vomiting.    spironolactone (ALDACTONE) 50 MG tablet Take 1 tablet (50 mg total) by mouth 2 (two) times daily. Qty: 60 tablet, Refills: 0    tiotropium (SPIRIVA) 18 MCG inhalation capsule Place 18 mcg into inhaler and inhale daily as needed (shortness of breath).     triamcinolone (NASACORT ALLERGY 24HR) 55 MCG/ACT AERO nasal inhaler Place 2 sprays into the nose daily as needed (for rhinitis).      STOP taking these medications     amitriptyline (ELAVIL) 50 MG tablet      diphenoxylate-atropine (LOMOTIL) 2.5-0.025 MG tablet      nystatin ointment (MYCOSTATIN)      prochlorperazine (COMPAZINE) 10 MG tablet      traMADol (ULTRAM) 50 MG tablet      triamcinolone cream (KENALOG) 0.1 %      terconazole (TERAZOL 7) 0.4 % vaginal cream               Today   CHIEF COMPLAINT:   No acute issues overnight. Physical therapy is recommending rehabilitation.  VITAL SIGNS:  Blood pressure 127/82, pulse 84, temperature 98.2 F (36.8 C), temperature source Oral, resp. rate 20, height '5\' 8"'$  (1.727 m), weight 115.5 kg (254 lb 11.2 oz), SpO2 98 %.   REVIEW OF SYSTEMS:  Review of Systems  Constitutional: Negative.  Negative for chills, fever and malaise/fatigue.  HENT: Negative.  Negative for ear discharge, ear pain, hearing loss, nosebleeds and sore throat.   Eyes: Negative.  Negative for blurred vision and pain.  Respiratory: Negative.  Negative for cough, hemoptysis, shortness of breath  and wheezing.   Cardiovascular: Negative.  Negative for chest pain, palpitations and leg swelling.  Gastrointestinal: Negative.  Negative for abdominal pain, blood in stool, diarrhea, nausea and vomiting.  Genitourinary: Negative.  Negative for dysuria.  Musculoskeletal: Negative.  Negative for back pain.  Skin: Negative.   Neurological: Negative for  dizziness, tremors, speech change, focal weakness, seizures and headaches.  Endo/Heme/Allergies: Negative.  Does not bruise/bleed easily.  Psychiatric/Behavioral: Negative.  Negative for depression, hallucinations and suicidal ideas.     PHYSICAL EXAMINATION:  GENERAL:  60 y.o.-year-old patient lying in the bed with no acute distress.  NECK:  Supple, no jugular venous distention. No thyroid enlargement, no tenderness.  LUNGS: Normal breath sounds bilaterally, no wheezing, rales,rhonchi  No use of accessory muscles of respiration.  CARDIOVASCULAR: S1, S2 normal. No murmurs, rubs, or gallops.  ABDOMEN: Soft, non-tender, non-distended. Bowel sounds present. No organomegaly or mass.  EXTREMITIES: No pedal edema, cyanosis, or clubbing.  PSYCHIATRIC: The patient is alert and oriented x 3.  SKIN: No obvious rash, lesion, or ulcer.   DATA REVIEW:   CBC  Recent Labs Lab 02/29/16 0858  WBC 4.5  HGB 9.2*  HCT 29.2*  PLT 160    Chemistries   Recent Labs Lab 02/25/16 0400  02/29/16 0858  NA 139  < > 138  K 3.5  < > 4.2  CL 112*  < > 109  CO2 23  < > 22  GLUCOSE 97  < > 102*  BUN 6  < > 9  CREATININE 0.78  < > 0.74  CALCIUM 8.0*  < > 9.0  AST 59*  --   --   ALT 27  --   --   ALKPHOS 208*  --   --   BILITOT 1.0  --   --   < > = values in this interval not displayed.  Cardiac Enzymes No results for input(s): TROPONINI in the last 168 hours.  Microbiology Results  '@[id number]'$ @  RADIOLOGY:  No results found.    Management plans discussed with the patient and she is in agreement. Stable for discharge  D/w family at  bedside  Patient's husband declined skilled nursing facility and therefore patient will be discharged home with home health.  CODE STATUS:     Code Status Orders        Start     Ordered   02/24/16 2004  Full code  Continuous     02/24/16 2003    Code Status History    Date Active Date Inactive Code Status Order ID Comments User Context   02/24/2016  8:03 PM 02/26/2016  5:20 PM Full Code [id number]  Idelle Crouch, MD Inpatient   12/26/2015  1:00 PM 01/05/2016  9:42 PM Full Code [id number]  Ashok Pall, MD Inpatient   10/22/2015  4:26 AM 10/25/2015  6:52 PM Full Code [id number]  Saundra Shelling, MD ED   08/16/2015  9:36 PM 08/30/2015  6:11 PM Full Code [id number]  Ashok Pall, MD Inpatient   06/22/2015  1:45 AM 06/22/2015  6:09 PM Full Code [id number]  Lytle Butte, MD ED   05/16/2015 12:00 PM 05/16/2015  6:17 PM DNR [id number]  Max Sane, MD Inpatient   05/15/2015  1:10 AM 05/16/2015 12:00 PM Full Code [id number]  Harrie Foreman, MD Inpatient   11/05/2014 12:35 PM 11/05/2014  3:55 PM Full Code [id number]  Penne Lash, RN Inpatient   11/05/2014 12:17 PM 11/05/2014 12:35 PM Full Code [id number]  Penne Lash, RN Inpatient      TOTAL TIME TAKING CARE OF THIS PATIENT: 38 minutes.    Note: This dictation was prepared with Dragon dictation along with smaller phrase technology. Any transcriptional errors that result from this process are unintentional.  Emmit Oriley M.D on 02/29/2016 at 11:49 AM  Between 7am to 6pm - Pager - [phone] After 6pm go to [email or web address] - password [email or web address] [facility]  Office  [phone]  CC: Primary care physician; Pcp Not In System

## 2016-02-28 NOTE — Progress Notes (Signed)
[facility] INFECTIOUS DISEASE PROGRESS NOTE Date of Admission:  02/24/2016     ID: LASHARI SAVIN is a 60 y.o. female with skull osteomyelitis, candidemia Principal Problem:   Acute intractable headache Active Problems:   HTN (hypertension)   Diabetes mellitus, type 2 (HCC)   Osteomyelitis of skull (HCC)   Acute headache   Infection   Subjective: Not much more awake  ROS  Unable to obtain   Medications:  Antibiotics Given (last 72 hours)    Date/Time Action Medication Dose Rate   02/25/16 1642 Given   vancomycin (VANCOCIN) 1,250 mg in sodium chloride 0.9 % 250 mL IVPB 1,250 mg 166.7 mL/hr   02/25/16 2048 Given   meropenem (MERREM) 1 g in sodium chloride 0.9 % 100 mL IVPB 1 g 200 mL/hr   02/26/16 0524 Given   vancomycin (VANCOCIN) 1,250 mg in sodium chloride 0.9 % 250 mL IVPB 1,250 mg 166.7 mL/hr   02/26/16 [id number] Given   meropenem (MERREM) 1 g in sodium chloride 0.9 % 100 mL IVPB 1 g 200 mL/hr   02/26/16 2139 Given   cephALEXin (KEFLEX) capsule 500 mg 500 mg    02/26/16 2140 Given   linezolid (ZYVOX) tablet 600 mg 600 mg    02/27/16 0601 Given   cephALEXin (KEFLEX) capsule 500 mg 500 mg    02/27/16 [id number] Given   linezolid (ZYVOX) tablet 600 mg 600 mg    02/27/16 1426 Given   cephALEXin (KEFLEX) capsule 500 mg 500 mg    02/27/16 2101 Given   cephALEXin (KEFLEX) capsule 500 mg 500 mg    02/27/16 2106 Given   linezolid (ZYVOX) tablet 600 mg 600 mg    02/28/16 0536 Given   cephALEXin (KEFLEX) capsule 500 mg 500 mg    02/28/16 1127 Given   linezolid (ZYVOX) tablet 600 mg 600 mg    02/28/16 1128 Given   nitrofurantoin (macrocrystal-monohydrate) (MACROBID) capsule 100 mg 100 mg    02/28/16 1420 Given   cephALEXin (KEFLEX) capsule 500 mg 500 mg      . sodium chloride   Intravenous Once  . albuterol  2.5 mg Nebulization BID  . amLODipine  10 mg Oral Daily  . cephALEXin  500 mg Oral Q8H  . docusate sodium  100 mg Oral BID  . famotidine  20 mg Oral BID  . fluconazole   400 mg Oral Daily  . furosemide  20 mg Oral [id number]  . gabapentin  600 mg Oral BID  . heparin  5,000 Units Subcutaneous Q8H  . insulin aspart  0-9 Units Subcutaneous TID WC  . levETIRAcetam  500 mg Oral BID  . linezolid  600 mg Oral Q12H  . lipase/protease/amylase  24,000 Units Oral TID WC  . mometasone-formoterol  2 puff Inhalation BID  . nitrofurantoin (macrocrystal-monohydrate)  100 mg Oral Q12H  . nystatin ointment   Topical BID  . pantoprazole  40 mg Oral Daily  . phenytoin  300 mg Oral BID  . spironolactone  50 mg Oral BID  . tiotropium  18 mcg Inhalation Daily    Objective: Vital signs in last 24 hours: Temp:  [97.9 F (36.6 C)-98.3 F (36.8 C)] 98.3 F (36.8 C) (08/23 1314) Pulse Rate:  [79-87] 87 (08/23 1314) Resp:  [18-20] 20 (08/23 0412) BP: (99-121)/(63-73) 121/66 (08/23 1314) SpO2:  [94 %-95 %] 95 % (08/23 0742) Weight:  [116.2 kg (256 lb 1.6 oz)] 116.2 kg (256 lb 1.6 oz) (08/23 0446) Constitutional: lethargic but arouses, minimally interactive,  confused HENT: [place]/AT, PERRLA, no scleral icterus Skull bone defect on R but scars well healed and no open wound or drainage Mouth/Throat: Oropharynx is clear and dry. No oropharyngeal exudate.  Cardiovascular: Normal rate, regular rhythm and normal heart sounds. Pulmonary/Chest: Effort normal and breath sounds normal. No respiratory distress.  has no wheezes.  Neck = supple, no nuchal rigidity PICC RUE WNL Abdominal: Soft. Bowel sounds are normal.  exhibits no distension. There is no tenderness.  Lymphadenopathy: no cervical adenopathy. No axillary adenopathy Neurological: lethargic,  Skin: Skin is warm and dry. No rash noted. No erythema.   Lab Results  Recent Labs  02/26/16 0637 02/27/16 0019 02/28/16 0753  WBC  --  4.0  --   HGB  --  7.5* 8.1*  HCT  --  24.6*  --   NA 137 138  --   K 3.9 3.9  --   CL 112* 110  --   CO2 21* 21*  --   BUN 7 7  --   CREATININE 0.76 0.83  --     Microbiology: Results for  orders placed or performed during the hospital encounter of 02/24/16  Blood culture (routine x 2)     Status: Abnormal   Collection Time: 02/24/16  3:56 PM  Result Value Ref Range Status   Specimen Description BLOOD RIGHT ARM  Final   Special Requests BOTTLES DRAWN AEROBIC AND ANAEROBIC  8CC  Final   Culture  Setup Time (A)  Final    YEAST IN BOTH AEROBIC AND ANAEROBIC BOTTLES CRITICAL RESULT CALLED TO, READ BACK BY AND VERIFIED WITH: LISA KLUTTZ AT 2100 02/25/16 SDR CRITICAL RESULT CALLED TO, READ BACK BY AND VERIFIED WITH: JASON ROBBINS @ 0002 ON 02/26/2016 BY CAF    Culture CANDIDA PARAPSILOSIS (A)  Final   Report Status 02/28/2016 FINAL  Final  Urine culture     Status: Abnormal   Collection Time: 02/24/16  3:56 PM  Result Value Ref Range Status   Specimen Description URINE, RANDOM  Final   Special Requests NONE  Final   Culture (A)  Final    >=100,000 COLONIES/mL ESCHERICHIA COLI Confirmed Extended Spectrum Beta-Lactamase Producer (ESBL) Performed at [facility]    Report Status 02/27/2016 FINAL  Final   Organism ID, Bacteria ESCHERICHIA COLI (A)  Final      Susceptibility   Escherichia coli - MIC*    AMPICILLIN >=32 RESISTANT Resistant     CEFAZOLIN >=64 RESISTANT Resistant     CEFTRIAXONE >=64 RESISTANT Resistant     CIPROFLOXACIN 1 SENSITIVE Sensitive     GENTAMICIN <=1 SENSITIVE Sensitive     IMIPENEM <=0.25 SENSITIVE Sensitive     NITROFURANTOIN <=16 SENSITIVE Sensitive     TRIMETH/SULFA <=20 SENSITIVE Sensitive     AMPICILLIN/SULBACTAM >=32 RESISTANT Resistant     PIP/TAZO 64 INTERMEDIATE Intermediate     Extended ESBL POSITIVE Resistant     * >=100,000 COLONIES/mL ESCHERICHIA COLI  Blood Culture ID Panel (Reflexed)     Status: Abnormal   Collection Time: 02/24/16  3:56 PM  Result Value Ref Range Status   Enterococcus species NOT DETECTED NOT DETECTED Final   Vancomycin resistance NOT DETECTED NOT DETECTED Final   Listeria monocytogenes NOT DETECTED NOT  DETECTED Final   Staphylococcus species NOT DETECTED NOT DETECTED Final   Staphylococcus aureus NOT DETECTED NOT DETECTED Final   Methicillin resistance NOT DETECTED NOT DETECTED Final   Streptococcus species NOT DETECTED NOT DETECTED Final   Streptococcus agalactiae  NOT DETECTED NOT DETECTED Final   Streptococcus pneumoniae NOT DETECTED NOT DETECTED Final   Streptococcus pyogenes NOT DETECTED NOT DETECTED Final   Acinetobacter baumannii NOT DETECTED NOT DETECTED Final   Enterobacteriaceae species NOT DETECTED NOT DETECTED Final   Enterobacter cloacae complex NOT DETECTED NOT DETECTED Final   Escherichia coli NOT DETECTED NOT DETECTED Final   Klebsiella oxytoca NOT DETECTED NOT DETECTED Final   Klebsiella pneumoniae NOT DETECTED NOT DETECTED Final   Proteus species NOT DETECTED NOT DETECTED Final   Serratia marcescens NOT DETECTED NOT DETECTED Final   Carbapenem resistance NOT DETECTED NOT DETECTED Final   Haemophilus influenzae NOT DETECTED NOT DETECTED Final   Neisseria meningitidis NOT DETECTED NOT DETECTED Final   Pseudomonas aeruginosa NOT DETECTED NOT DETECTED Final   Candida albicans NOT DETECTED NOT DETECTED Final   Candida glabrata NOT DETECTED NOT DETECTED Final   Candida krusei NOT DETECTED NOT DETECTED Final   Candida parapsilosis DETECTED (A) NOT DETECTED Final    Comment: CRITICAL RESULT CALLED TO, READ BACK BY AND VERIFIED WITH: CALLED TO LISA KLUTTZ @ 2100 ON 02/25/2016 BY SDR/CAF    Candida tropicalis NOT DETECTED NOT DETECTED Final  Blood culture (routine x 2)     Status: None (Preliminary result)   Collection Time: 02/24/16  4:54 PM  Result Value Ref Range Status   Specimen Description BLOOD RIGHT ASSIST CONTROL  Final   Special Requests BOTTLES DRAWN AEROBIC AND ANAEROBIC 1CC  Final   Culture NO GROWTH 4 DAYS  Final   Report Status PENDING  Incomplete  MRSA PCR Screening     Status: None   Collection Time: 02/24/16  8:00 PM  Result Value Ref Range Status    MRSA by PCR NEGATIVE NEGATIVE Final    Comment:        The GeneXpert MRSA Assay (FDA approved for NASAL specimens only), is one component of a comprehensive MRSA colonization surveillance program. It is not intended to diagnose MRSA infection nor to guide or monitor treatment for MRSA infections.   Culture, blood (Routine X 2) w Reflex to ID Panel     Status: None (Preliminary result)   Collection Time: 02/27/16 12:50 AM  Result Value Ref Range Status   Specimen Description BLOOD RIGHT FINGER  Final   Special Requests BOTTLES DRAWN AEROBIC AND ANAEROBIC 3ML 5ML  Final   Culture NO GROWTH 1 DAY  Final   Report Status PENDING  Incomplete  Culture, blood (Routine X 2) w Reflex to ID Panel     Status: None (Preliminary result)   Collection Time: 02/27/16  2:58 AM  Result Value Ref Range Status   Specimen Description BLOOD LEFT FINGER  Final   Special Requests   Final    BOTTLES DRAWN AEROBIC AND ANAEROBIC AER 5ML ANA .5ML   Culture NO GROWTH 1 DAY  Final   Report Status PENDING  Incomplete    Studies/Results: Dg Chest 2 View  Result Date: 02/26/2016 CLINICAL DATA:  Subjective fever, headache EXAM: CHEST  2 VIEW COMPARISON:  02/24/2016 FINDINGS: Low lung volumes. Mild right basilar opacity, likely atelectasis. No pleural effusion or pneumothorax. The heart is normal in size Visualized osseous structures are within normal limits. IMPRESSION: Mild right basilar opacity, likely atelectasis. No evidence of acute cardiopulmonary disease. Electronically Signed   By: Julian Hy M.D.   On: 02/26/2016 18:26   Echo Study Conclusions  - Left ventricle: The cavity size was normal. Systolic function was   normal. The estimated  ejection fraction was in the range of 60%   to 65%. Assessment/Plan: GABRIANNA BEARS is a 60 y.o. female s/p craniotomy in March 0000000 complicated by osteomyelitis, s/p craniotomy and removal of flap and hardware with cultures growing MRSA, Klebsiella and  Peptostreptococcus now with picc associated Candidemia.  Picc removed 8/22 UCX E coli sensis pending, ua only 6-30 wbc  Recommendations Candidemia - PICC associated- C. Parapsilosis  Neg TTE - echo  FU bcx 8/22 ngtd Cont  oral fluconazole since IV access lost Plan on 2 week course from 8/22 if cx negative - stop date Sept 5th   Skull osteomyelitis - unclear if still active. Prior cx with MRSA and Klebsiella. (but the MRSA was R clinda and doxy and she is bactrim allergic). Will plan on another 2 week oral course for this - cont linezolid for MRSA  -  keflex for the Klebsiella  With the linezolid it can suppress bone marrow, hgb already low at 7.5 and plts 130 Would check cbc q 3 days while on linezolid Would consider transfusion as can expect hgb to drop further  Stop date for these will be Sept 5th   ESBL E coli on UCX- has received 2 days meropenem  Give 3 more days macrobid 100 bid - ordered Thank you very much for the consult. Will follow with you.  Jcion Buddenhagen P   02/28/2016, 4:06 PM

## 2016-02-28 NOTE — Clinical Social Work Note (Signed)
Clinical Social Work Assessment  Patient Details  Name: [name] MRN: [id number] Date of Birth: 07-08-1956  Date of referral:  02/28/16               Reason for consult:  Discharge Planning                Permission sought to share information with:  Family Supports Permission granted to share information::  Yes, Verbal Permission Granted  Name::        Agency::     Relationship::   [name]- Mother; [name]- Husband)  Contact Information:     Housing/Transportation Living arrangements for the past 2 months:  Single Family Home Source of Information:  Patient, Spouse, Partner Patient Interpreter Needed:  None Criminal Activity/Legal Involvement Pertinent to Current Situation/Hospitalization:  No - Comment as needed Significant Relationships:  Spouse, Parents Lives with:  Spouse Do you feel safe going back to the place where you live?  Yes Need for family participation in patient care:  Yes (Comment) [name]- Mother; [name]- Husband)  Care giving concerns:  PT recommended SNF for patient at discharge.    Social Worker assessment / plan:  CSW met with patient at bedside. CSW introduced herself and her role. CSW explained to patient that PT recommended SNF placement. Patient stated that she was not going to [facility]. CSW discussed the risks if patient were to return home. Patient reported that her husband [name] [phone]) would take care of her. Granted CSW permission to contact her husband. Patient reported to CSW that she can maker her own decisions and that she can take care of herself.  CSW contacted patient's husband [name] at the above number. Per [name] he cannot care for patient in her current state. Stated that they stay in a 1 bedroom apartment and would works 3rd shift. Reported that patient should go to STR but does not think he could talk patient into going. Requested CSW contact a [name] but did not have any contact information.   CSW contacted patient's  mother [name] (information listed on facesheet). Informed [name] that patient is unsafe to go home and requested she tried to talk patient into going to STR. [name] stated patient can make her own decisions and that she has no "say so" in what patient will do at discharge.   CSW and RNCM went to speak to patient again about STR. Patient stated she still did not want to go but granted CSW verbal permission to send SNF referral to SNFs in [facility].  Per MD patient to discharge possibly discharge tomorrow 02/29/16. CSW also, asked MD for a psych. consult to determine if patient can make decisions. CSW awaiting bed offers. Will continue to follow and assist.    Employment status:  Retired Nurse, adult PT Recommendations:  [place] / Referral to community resources:  [place]  Patient/Family's Response to care:  Patient is somewhat in agreement to discharge to SNF. Granted CSW verbal permission to complete a SNF serach. Awaiting bed offers.   Patient/Family's Understanding of and Emotional Response to Diagnosis, Current Treatment, and Prognosis:  Patient reports she understands her diagnosis, current treatment and prognosis but does not agree that she will need PT. Patient stated she cannot walk but feels that she can care for herself.   Emotional Assessment Appearance:  Appears stated age Attitude/Demeanor/Rapport:  Lethargic Affect (typically observed):  Calm, Irritable Orientation:  Oriented to Self, Oriented to Place, Oriented to Situation,  Oriented to  Time Alcohol / Substance use:  Not Applicable Psych involvement (Current and /or in the community):  No (Comment)  Discharge Needs  Concerns to be addressed:  Discharge Planning Concerns Readmission within the last 30 days:  No Current discharge risk:  Chronically ill Barriers to Discharge:  Continued Medical Work up   [employer], [place] 02/28/2016,  3:09 PM

## 2016-02-29 ENCOUNTER — Ambulatory Visit: Admission: RE | Admit: 2016-02-29 | Payer: Medicare Other | Source: Ambulatory Visit

## 2016-02-29 DIAGNOSIS — F068 Other specified mental disorders due to known physiological condition: Secondary | ICD-10-CM

## 2016-02-29 DIAGNOSIS — F0280 Dementia in other diseases classified elsewhere without behavioral disturbance: Secondary | ICD-10-CM

## 2016-02-29 LAB — CBC
HEMATOCRIT: 29.2 % — AB (ref 35.0–47.0)
HEMOGLOBIN: 9.2 g/dL — AB (ref 12.0–16.0)
MCH: 22.7 pg — ABNORMAL LOW (ref 26.0–34.0)
MCHC: 31.5 g/dL — AB (ref 32.0–36.0)
MCV: 71.8 fL — AB (ref 80.0–100.0)
Platelets: 160 10*3/uL (ref 150–440)
RBC: 4.06 MIL/uL (ref 3.80–5.20)
RDW: 20.4 % — ABNORMAL HIGH (ref 11.5–14.5)
WBC: 4.5 10*3/uL (ref 3.6–11.0)

## 2016-02-29 LAB — GLUCOSE, CAPILLARY
GLUCOSE-CAPILLARY: 157 mg/dL — AB (ref 65–99)
Glucose-Capillary: 92 mg/dL (ref 65–99)

## 2016-02-29 LAB — TYPE AND SCREEN
ABO/RH(D): A POS
Antibody Screen: POSITIVE
UNIT DIVISION: 0

## 2016-02-29 LAB — BASIC METABOLIC PANEL
ANION GAP: 7 (ref 5–15)
BUN: 9 mg/dL (ref 6–20)
CHLORIDE: 109 mmol/L (ref 101–111)
CO2: 22 mmol/L (ref 22–32)
Calcium: 9 mg/dL (ref 8.9–10.3)
Creatinine, Ser: 0.74 mg/dL (ref 0.44–1.00)
GFR calc non Af Amer: >60 mL/min (ref >60)
GLUCOSE: 102 mg/dL — AB (ref 65–99)
Potassium: 4.2 mmol/L (ref 3.5–5.1)
Sodium: 138 mmol/L (ref 135–145)

## 2016-02-29 LAB — CULTURE, BLOOD (ROUTINE X 2): Culture: NO GROWTH

## 2016-02-29 NOTE — Care Management Important Message (Signed)
Important Message  Patient Details  Name: [name] MRN: [id number] Date of Birth: [birth year]   Medicare Important Message Given:  Yes    Shelbie Ammons, RN 02/29/2016, 8:17 AM

## 2016-02-29 NOTE — Care Management (Signed)
[facility] is unable to take this referral at this time.  Mr. Burk updated. Declining home health at this time. Shelbie Ammons RN MSN CCM care Management [phone]

## 2016-02-29 NOTE — Progress Notes (Signed)
Per Dr. Weber Cooks today patient would not communicate with him and he will document that she does not have capacity to make decisions for herself. Clinical Education officer, museum (CSW) contacted patient's husband Valora Rojek and made him aware of above. CSW discussed D/C plan with husband and he stated that he wanted patient to come home. CSW explained SNF option to husband and he refused SNF and reported that patient's mother will pick her up today to transport her home. RN Case Manager and RN are aware of above. Please reconsult if future social work needs arise. CSW signing off.   [employer], LCSW [phone]

## 2016-02-29 NOTE — Consult Note (Signed)
[facility] Consult   Reason for Consult:  Follow-up consult for this 60 year old woman. Question regarding capacity in the context of discharge planning Referring Physician:  Mody Patient Identification: [name] MRN:  [id number] Principal Diagnosis: Organic dementia Diagnosis:   Patient Active Problem List   Diagnosis Date Noted  . 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 Infection [B99.9] 02/26/2016  . Acute intractable headache [R51] 02/24/2016  . Acute headache [R51] 02/24/2016  . Osteomyelitis of skull (HCC) [M86.9]   . Encephalopathy [G93.40]   . Status post craniectomy [Z98.890] 12/28/2015  . Wound infection after surgery [T81.4XXA] 12/26/2015  . UTI (lower urinary tract infection) [N39.0] 10/22/2015  . Chest pain on breathing [R07.1]   . SOB (shortness of breath) [R06.02]   . Wheezing [R06.2]   . Subdural hematoma ([place]) [I62.00] 08/16/2015  . Sepsis ([place]) [A41.9] 06/22/2015  . Orthostatic hypotension [I95.1] 05/15/2015  . ALC (alcoholic liver cirrhosis) ([place]) [K70.30] 12/23/2014  . Clinical depression [F32.9] 12/23/2014  . Anxiety [F41.9] 12/23/2014  . Diabetes mellitus, type 2 ([place]) [E11.9] 12/23/2014  . HTN (hypertension) [I10] 11/05/2014  . Liver cirrhosis ([place]) [K74.60] 07/19/2014  . 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 Chronic pancreatitis ([place]) [K86.1] 11/03/2012  . Seizure disorder ([place]) [N82.956] 11/03/2012  . Benign meningioma of brain ([place]) [D32.0] 11/03/2012  . Chronic pelvic pain in female [N94.9, G89.29] 11/03/2012  . Hepatitis C virus infection without hepatic coma [B19.20] 11/03/2012  . UTI (urinary tract infection) [N39.0] 11/03/2012  . Alcohol abuse, unspecified [F10.10] 11/03/2012  . Chronic liver disease [K76.9] 11/03/2012  . Schizophrenia ([place]) [F20.9] 11/03/2012  . Narcotic abuse [F11.10] 11/03/2012  . 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 Breast screening [Z12.39] 11/03/2012  . 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 Nondependent alcohol abuse [F10.10] 11/03/2012  . 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 Female genital symptoms [N94.9] 11/03/2012    Total Time spent with patient: 45 minutes  Subjective:   [name] is a 60 y.o. female patient admitted with Patient herself did not make any response. This 60 year old woman was admitted to the hospital with acute complaints of severe headache. She was found to have a fungal infection related to an indwelling line. At this point she has stabilized. She has chronic problems with headaches seizure disorder and hypertension.Marland Kitchen  HPI:  This is my second time coming to see the patient to attempt consult. I came by yesterday afternoon and the patient was not responding to my attempts to engage her in conversation. I spoke with Dr. Benjie Karvonen again this morning as well as the social worker on the case. The question at hand as I understand it is that physical therapy and the clinical team have recommended that the patient be discharged to a skilled nursing facility. The patient had expressed her refusal to go to skilled nursing facility and her preference to go home. Her family live at home have expressed reservations about their ability to care for her but despite this the patient has insisted on discharge home.  I came this morning at 11 AM. I found the patient asleep. I spoke to her and she did open her eyes at first. I explained to her the reason for the consult and that I only needed to talk to her for a few minutes and that this could clear the way for discharge. Patient closed her eyes again and went back to sleep. I attempted once again by  speaking her name and gently touching her hand and arm to wake her up. Once again she opened her eyes. She slightly nodded her head when I asked if she could hear me. She slightly nodded her head when I asked if she was able to speak. She did  not speak when I asked her to do so. She then closed her eyes and became unresponsive again. I went through this 3 times and also explained to her that if she was not able to communicate with me that I would be likely to conclude that she did not have capacity to make decisions. Spoke with Education officer, museum who told me that the treatment team has noted that the patient will often feign sleep when she does not want to engage in conversations. Reviewed chart. Patient did not show any evidence that I can see in the chart documented of really being able to make reasonable decisions about her own discharge and follow-up care.  Social history: Patient has been living at home with, I believe, her husband. Husband has expressed a great deal of concern about his ability to care for her because of her medical problems and immobility.  Medical history: Patient has a history of a brain injury with surgery as well as skull surgery that has resulted in chronic headaches and a seizure disorder. I don't see a previous psychiatric or psychological evaluations so it's not clear to me what degree of dementia she might have as a result as well. Also has high blood pressure and acute fungal infection that is being treated orally chronic pain  Substance abuse history: I do not see any clear documentation of concern about substance abuse  Past Psychiatric History: No known past psychiatric history. No previous psychiatric evaluations in the chart. Patient was not able to provide any history.  Risk to Self: Is patient at risk for suicide?: No Risk to Others:   Prior Inpatient Therapy:   Prior Outpatient Therapy:    Past Medical History:  Past Medical History:  Diagnosis Date  . Allergy   . Chronic bronchitis ([place])   . Chronic kidney disease   . Cirrhosis ([place])   . Depression   . Diabetes mellitus without complication ([place])   . Diverticulitis   . Emphysema of lung ([place])   . GERD (gastroesophageal reflux disease)   .  H/O drug abuse    Clean since 2009  . H/O ETOH abuse    Sober since 2009  . Hepatitis C   . Hypertension   . Malignant brain tumor ([place]) 2007  . Migraines   . Pancreatic ascites   . Pancreatitis, alcoholic 5397  . Rectovaginal fistula   . Seizures ([place])   . Stroke [facility]) 2007   during brain surgery  . Urine incontinence     Past Surgical History:  Procedure Laterality Date  . ABDOMINAL HYSTERECTOMY  1979  . APPENDECTOMY  1999  . BRAIN SURGERY  2007   malignant brain tumor  . CHOLECYSTECTOMY  2001  . CRANIOPLASTY N/A 09/15/2015   Procedure: Cranioplasty with retrieval of bone flap from abdominal pocket;  Surgeon: Ashok Pall, MD;  Location: [facility];  Service: Neurosurgery;  Laterality: N/A;  Cranioplasty with retrieval of bone flap from abdominal pocket  . CRANIOTOMY Right 08/16/2015   Procedure: Right Fronto-Temporal-Parietal Craniotomy for Evacuation of Hematoma with Bone Flap Placement in Abdomen;  Surgeon: Ashok Pall, MD;  Location: [facility];  Service: Neurosurgery;  Laterality: Right;  . CRANIOTOMY N/A 12/28/2015  Procedure: Craniectomy for wound debridement;  Surgeon: Ashok Pall, MD;  Location: [facility] [facility];  Service: Neurosurgery;  Laterality: N/A;  Craniectomy for wound debridement  . EYE SURGERY    . rectovaginal fistula repair w/ colostomy  2011   Family History:  Family History  Problem Relation Age of Onset  . Hypertension Mother   . Heart disease Father   . Diabetes Father   . Cancer Sister     brain  . Cancer Grandchild 8    brain tumor   Family Psychiatric  History: No information available Social History:  History  Alcohol Use  . 2.4 oz/week  . 4 Cans of beer per week     History  Drug Use No    Social History   Social History  . Marital status: Divorced    Spouse name: N/A  . Number of children: N/A  . Years of education: N/A   Occupational History  . disabled    Social History Main Topics  . Smoking status: Former Smoker     Packs/day: 0.50    Types: Cigarettes  . Smokeless tobacco: Never Used     Comment: cutting back  . Alcohol use 2.4 oz/week    4 Cans of beer per week  . Drug use: No  . Sexual activity: Not Currently    Birth control/ protection: Other-see comments     Comment: hysterectomy   Other Topics Concern  . None   Social History Narrative   Lives in [place] with husband, has 4 sons (2 in prison).            Additional Social History:    Allergies:   Allergies  Allergen Reactions  . Ibuprofen Other (See Comments)    Pt states that she is unable to take this medication because of her liver.    . Sulfa Antibiotics Hives  . Hydroxyzine Other (See Comments)    Causes crying   . Amoxicillin Itching, Rash and Other (See Comments)    Has patient had a PCN reaction causing immediate rash, facial/tongue/throat swelling, SOB or lightheadedness with hypotension: No Has patient had a PCN reaction causing severe rash involving mucus membranes or skin necrosis: No Has patient had a PCN reaction that required hospitalization No Has patient had a PCN reaction occurring within the last 10 years: Yes If all of the above answers are "NO", then may proceed with Cephalosporin use.  . Chocolate Rash  . Ciprofloxacin Itching and Rash  . Dilaudid [Hydromorphone Hcl] Itching  . Fentanyl Rash  . Hydromorphone Itching  . Metformin Nausea And Vomiting  . Penicillins Itching, Rash and Other (See Comments)    Has patient had a PCN reaction causing immediate rash, facial/tongue/throat swelling, SOB or lightheadedness with hypotension: No Has patient had a PCN reaction causing severe rash involving mucus membranes or skin necrosis: No Has patient had a PCN reaction that required hospitalization No Has patient had a PCN reaction occurring within the last 10 years: Yes If all of the above answers are "NO", then may proceed with Cephalosporin use.  . Strawberry Extract Rash  . Zofran [Ondansetron Hcl]  Itching and Nausea And Vomiting    Labs:  Results for orders placed or performed during the hospital encounter of 02/24/16 (from the past 48 hour(s))  Glucose, capillary     Status: Abnormal   Collection Time: 02/27/16 11:33 AM  Result Value Ref Range   Glucose-Capillary 119 (H) 65 - 99 mg/dL  Glucose, capillary  Status: Abnormal   Collection Time: 02/27/16  5:01 PM  Result Value Ref Range   Glucose-Capillary 102 (H) 65 - 99 mg/dL  Glucose, capillary     Status: Abnormal   Collection Time: 02/27/16  8:22 PM  Result Value Ref Range   Glucose-Capillary 100 (H) 65 - 99 mg/dL   Comment 1 Notify RN   Hemoglobin     Status: Abnormal   Collection Time: 02/28/16  7:53 AM  Result Value Ref Range   Hemoglobin 8.1 (L) 12.0 - 16.0 g/dL  Type and screen [place]     Status: None   Collection Time: 02/28/16  7:53 AM  Result Value Ref Range   ABO/RH(D) A POS    Antibody Screen POS    Sample Expiration 03/02/2016    Antibody Identification ANTI S ANTI E    Unit Number [id number]    Blood Component Type RED CELLS,LR    Unit division 00    Status of Unit ISSUED,FINAL    Transfusion Status OK TO TRANSFUSE    Crossmatch Result COMPATIBLE   Prepare RBC     Status: None   Collection Time: 02/28/16  7:53 AM  Result Value Ref Range   Order Confirmation ORDER PROCESSED BY BLOOD BANK   Glucose, capillary     Status: Abnormal   Collection Time: 02/28/16  8:11 AM  Result Value Ref Range   Glucose-Capillary 108 (H) 65 - 99 mg/dL  Glucose, capillary     Status: Abnormal   Collection Time: 02/28/16 11:56 AM  Result Value Ref Range   Glucose-Capillary 111 (H) 65 - 99 mg/dL  Glucose, capillary     Status: None   Collection Time: 02/28/16  5:52 PM  Result Value Ref Range   Glucose-Capillary 96 65 - 99 mg/dL  Glucose, capillary     Status: None   Collection Time: 02/29/16  7:56 AM  Result Value Ref Range   Glucose-Capillary 92 65 - 99 mg/dL  CBC     Status: Abnormal     Collection Time: 02/29/16  8:58 AM  Result Value Ref Range   WBC 4.5 3.6 - 11.0 K/uL   RBC 4.06 3.80 - 5.20 MIL/uL   Hemoglobin 9.2 (L) 12.0 - 16.0 g/dL   HCT 29.2 (L) 35.0 - 47.0 %   MCV 71.8 (L) 80.0 - 100.0 fL   MCH 22.7 (L) 26.0 - 34.0 pg   MCHC 31.5 (L) 32.0 - 36.0 g/dL   RDW 20.4 (H) 11.5 - 14.5 %   Platelets 160 150 - 440 K/uL  Basic metabolic panel     Status: Abnormal   Collection Time: 02/29/16  8:58 AM  Result Value Ref Range   Sodium 138 135 - 145 mmol/L   Potassium 4.2 3.5 - 5.1 mmol/L   Chloride 109 101 - 111 mmol/L   CO2 22 22 - 32 mmol/L   Glucose, Bld 102 (H) 65 - 99 mg/dL   BUN 9 6 - 20 mg/dL   Creatinine, Ser 0.74 0.44 - 1.00 mg/dL   Calcium 9.0 8.9 - 10.3 mg/dL   GFR calc non Af Amer >60 >60 mL/min   GFR calc Af Amer >60 >60 mL/min    Comment: (NOTE) The eGFR has been calculated using the CKD EPI equation. This calculation has not been validated in all clinical situations. eGFR's persistently <60 mL/min signify possible Chronic Kidney Disease.    Anion gap 7 5 - 15    Current Facility-Administered Medications  Medication Dose Route Frequency Provider Last Rate Last Dose  . 0.9 %  sodium chloride infusion   Intravenous Once Sital Mody, MD      . 0.9 % NaCl with KCl 40 mEq / L  infusion   Intravenous Continuous Idelle Crouch, MD 75 mL/hr at 02/29/16 0516 75 mL/hr at 02/29/16 0516  . acetaminophen (TYLENOL) tablet 650 mg  650 mg Oral Q6H PRN Idelle Crouch, MD   650 mg at 02/27/16 7017   Or  . acetaminophen (TYLENOL) suppository 650 mg  650 mg Rectal Q6H PRN Idelle Crouch, MD      . albuterol (PROVENTIL) (2.5 MG/3ML) 0.083% nebulizer solution 2.5 mg  2.5 mg Nebulization BID Idelle Crouch, MD   2.5 mg at 02/29/16 0815  . albuterol (PROVENTIL) (2.5 MG/3ML) 0.083% nebulizer solution 3 mL  3 mL Inhalation Q6H PRN Idelle Crouch, MD      . ALPRAZolam Duanne Moron) tablet 1 mg  1 mg Oral BID PRN Idelle Crouch, MD   1 mg at 02/28/16 2154  . amLODipine  (NORVASC) tablet 10 mg  10 mg Oral Daily Idelle Crouch, MD   10 mg at 02/29/16 7939  . bisacodyl (DULCOLAX) suppository 10 mg  10 mg Rectal Daily PRN Idelle Crouch, MD      . cephALEXin [facility]) capsule 500 mg  500 mg Oral Q8H Leonel Ramsay, MD   500 mg at 02/29/16 0516  . diphenhydrAMINE (BENADRYL) capsule 25 mg  25 mg Oral Q8H PRN Idelle Crouch, MD   25 mg at 02/24/16 1832  . docusate sodium (COLACE) capsule 100 mg  100 mg Oral BID Idelle Crouch, MD   100 mg at 02/29/16 0300  . famotidine (PEPCID) tablet 20 mg  20 mg Oral BID Bettey Costa, MD   20 mg at 02/29/16 0936  . fluconazole (DIFLUCAN) tablet 400 mg  400 mg Oral Daily Leonel Ramsay, MD   400 mg at 02/28/16 2154  . furosemide (LASIX) tablet 20 mg  20 mg Oral BH-q7a Idelle Crouch, MD   20 mg at 02/29/16 0516  . gabapentin (NEURONTIN) capsule 600 mg  600 mg Oral BID Idelle Crouch, MD   600 mg at 02/29/16 0936  . heparin injection 5,000 Units  5,000 Units Subcutaneous Q8H Idelle Crouch, MD   5,000 Units at 02/29/16 [phone]  . insulin aspart (novoLOG) injection 0-9 Units  0-9 Units Subcutaneous TID WC Idelle Crouch, MD   1 Units at 02/26/16 [phone]  . levETIRAcetam (KEPPRA) tablet 500 mg  500 mg Oral BID Idelle Crouch, MD   500 mg at 02/29/16 2263  . linezolid (ZYVOX) tablet 600 mg  600 mg Oral Q12H Leonel Ramsay, MD   600 mg at 02/29/16 3354  . lipase/protease/amylase (CREON) capsule 24,000 Units  24,000 Units Oral TID WC Idelle Crouch, MD   24,000 Units at 02/29/16 [phone]  . mometasone-formoterol (DULERA) 200-5 MCG/ACT inhaler 2 puff  2 puff Inhalation BID Idelle Crouch, MD   2 puff at 02/29/16 0940  . nitrofurantoin (macrocrystal-monohydrate) (MACROBID) capsule 100 mg  100 mg Oral Q12H Bettey Costa, MD   100 mg at 02/29/16 0937  . nystatin ointment (MYCOSTATIN)   Topical BID Idelle Crouch, MD      . oxyCODONE (Oxy IR/ROXICODONE) immediate release tablet 15 mg  15 mg Oral BID PRN Bettey Costa, MD   15 mg at  02/29/16 0936  .  pantoprazole (PROTONIX) EC tablet 40 mg  40 mg Oral Daily Idelle Crouch, MD   40 mg at 02/29/16 8657  . phenytoin (DILANTIN) 125 MG/5ML suspension 300 mg  300 mg Oral BID Idelle Crouch, MD   300 mg at 02/29/16 8469  . prochlorperazine (COMPAZINE) tablet 10 mg  10 mg Oral Q12H PRN Idelle Crouch, MD      . promethazine (PHENERGAN) tablet 12.5 mg  12.5 mg Oral Q6H PRN Idelle Crouch, MD      . promethazine (PHENERGAN) tablet 25 mg  25 mg Oral Q8H PRN Idelle Crouch, MD   25 mg at 02/27/16 0914  . spironolactone (ALDACTONE) tablet 50 mg  50 mg Oral BID Idelle Crouch, MD   50 mg at 02/29/16 0936  . tiotropium (SPIRIVA) inhalation capsule 18 mcg  18 mcg Inhalation Daily Bettey Costa, MD   18 mcg at 02/29/16 0940  . triamcinolone (NASACORT) nasal inhaler 2 spray  2 spray Nasal Daily PRN Idelle Crouch, MD      . zolpidem (AMBIEN) tablet 5 mg  5 mg Oral QHS PRN Bettey Costa, MD   5 mg at 02/28/16 2153    Musculoskeletal: Strength & Muscle Tone: decreased Gait & Station: unable to stand Patient leans: N/A  Psychiatric Specialty Exam: Physical Exam  Constitutional: She appears well-developed and well-nourished. She appears listless. She is uncooperative. She has a sickly appearance.  HENT:  Head: Normocephalic and atraumatic.    Eyes: Conjunctivae are normal. Pupils are equal, round, and reactive to light.  Neck: Normal range of motion.  Cardiovascular: Normal heart sounds.   Respiratory: She is in respiratory distress. She has wheezes.  GI: Soft.  Musculoskeletal: Normal range of motion.  Neurological: She appears listless.  Skin: Skin is warm and dry.  Psychiatric: Her affect is blunt. She is withdrawn. She is noncommunicative. She is inattentive.    Review of Systems  Unable to perform ROS: Medical condition    Blood pressure 127/82, pulse 84, temperature 98.2 F (36.8 C), temperature source Oral, resp. rate 20, height '5\' 8"'$  (1.727 m), weight 115.5 kg  (254 lb 11.2 oz), SpO2 98 %.Body mass index is 38.73 kg/m.  General Appearance: Disheveled  Eye Contact:  None  Speech:  None  Volume:  Decreased  Mood:  Negative  Affect:  Negative  Thought Process:  NA  Orientation:  Negative  Thought Content:  Negative  Suicidal Thoughts:  Unknown but no indication of it  Homicidal Thoughts:  Also unknown but no indication of concern  Memory:  Negative  Judgement:  Negative  Insight:  Negative  Psychomotor Activity:  Negative  Concentration:  Concentration: Poor  Recall:  Poor  Fund of Knowledge:  Negative  Language:  Negative  Akathisia:  Negative  Handed:  Right  AIMS (if indicated):     Assets:  Financial Resources/Insurance Resilience Social Support  ADL's:  Impaired  Cognition:  Impaired,  Moderate and Severe  Sleep:        Treatment Plan Summary: Plan Patient with chronic impairment including chronic neurologic impairment. Physical therapy and treatment team feel that discharge home is unsafe under the circumstances. Family reportedly feels uncomfortable with discharge home. Patient had reportedly voiced a refusal to go to skilled nursing facility.   Although there is a baseline presumption of capacity, when serious concerns have been raised by the treatment team to question of patient's capacity it is necessary to be able to demonstrate at least a  basic capacity particularly when the potential outcome is possibly unsafe to the patient. In this case I have made multiple attempts to engage the patient in a conversation that could demonstrate her capacity. I have been unable to get her to engage in conversation. I do not know whether this is because of sedating medication, underlying illness or a willful refusal on her part to engage. It is possible that the patient's mental state could change in the future and could allow for reassessment of capacity. However at this time it's appropriate to conclude that she LACKS CAPACITY to make  decisions for her own care. I have conveyed this to social work.  Disposition: Case discussed with social work. No completed. I will pass on the information to Dr. Jackquline Denmark well. If any further assistance is required please let me know.  Alethia Berthold, MD 02/29/2016 11:24 AM

## 2016-02-29 NOTE — Progress Notes (Signed)
Patient discharged home with home health. Recommendations were made for patient to go to SNF for rehab but patient and family refused. Nurse talked with patient and family about safety concerns but patient and family insisted on going home.  All discharge instructions given to patient and her mother and all questions answered. Escorted by nursing and family.

## 2016-03-03 LAB — CULTURE, BLOOD (ROUTINE X 2)
Culture: NO GROWTH
Culture: NO GROWTH

## 2016-03-06 ENCOUNTER — Telehealth: Payer: Self-pay | Admitting: Obstetrics and Gynecology

## 2016-03-06 ENCOUNTER — Telehealth: Payer: Self-pay | Admitting: Gastroenterology

## 2016-03-06 NOTE — Telephone Encounter (Signed)
Pt called and she went to a new family doctor and he does not give out pain meds, but he did blood work, and she has elevated liver function and pancreatitis, but she does want to go to [facility] because they talk to her like a dog, and she told him that dr cherry give pain meds so she was wondering if she could give her meds and also she wanted her to call us and read to you or dr cherry what the blood work said and her thinks the pic line is getting infected and turning septic, so she would like a call back on what she needs to do and for her to tell you what is going per her new family doctor

## 2016-03-06 NOTE — Telephone Encounter (Signed)
Attempted to call patient and schedule appointment for abdominal pain with Dr. Allen Norris but patients mailbox is full and can't take messages.

## 2016-03-08 NOTE — Telephone Encounter (Signed)
Informed pt that we are only her GYN provider and not responsible for her IV or port. Pt needs to call her PCP or home health aid.

## 2016-03-10 IMAGING — CR DG CHEST 1V
1 series · 1 of 1 positions shown · non-contrast
Comparison: 01/31/2014

CLINICAL DATA: Back pain in the shoulder pain with no known injury,
history of COPD

EXAM:
CHEST - 1 VIEW

[ap]
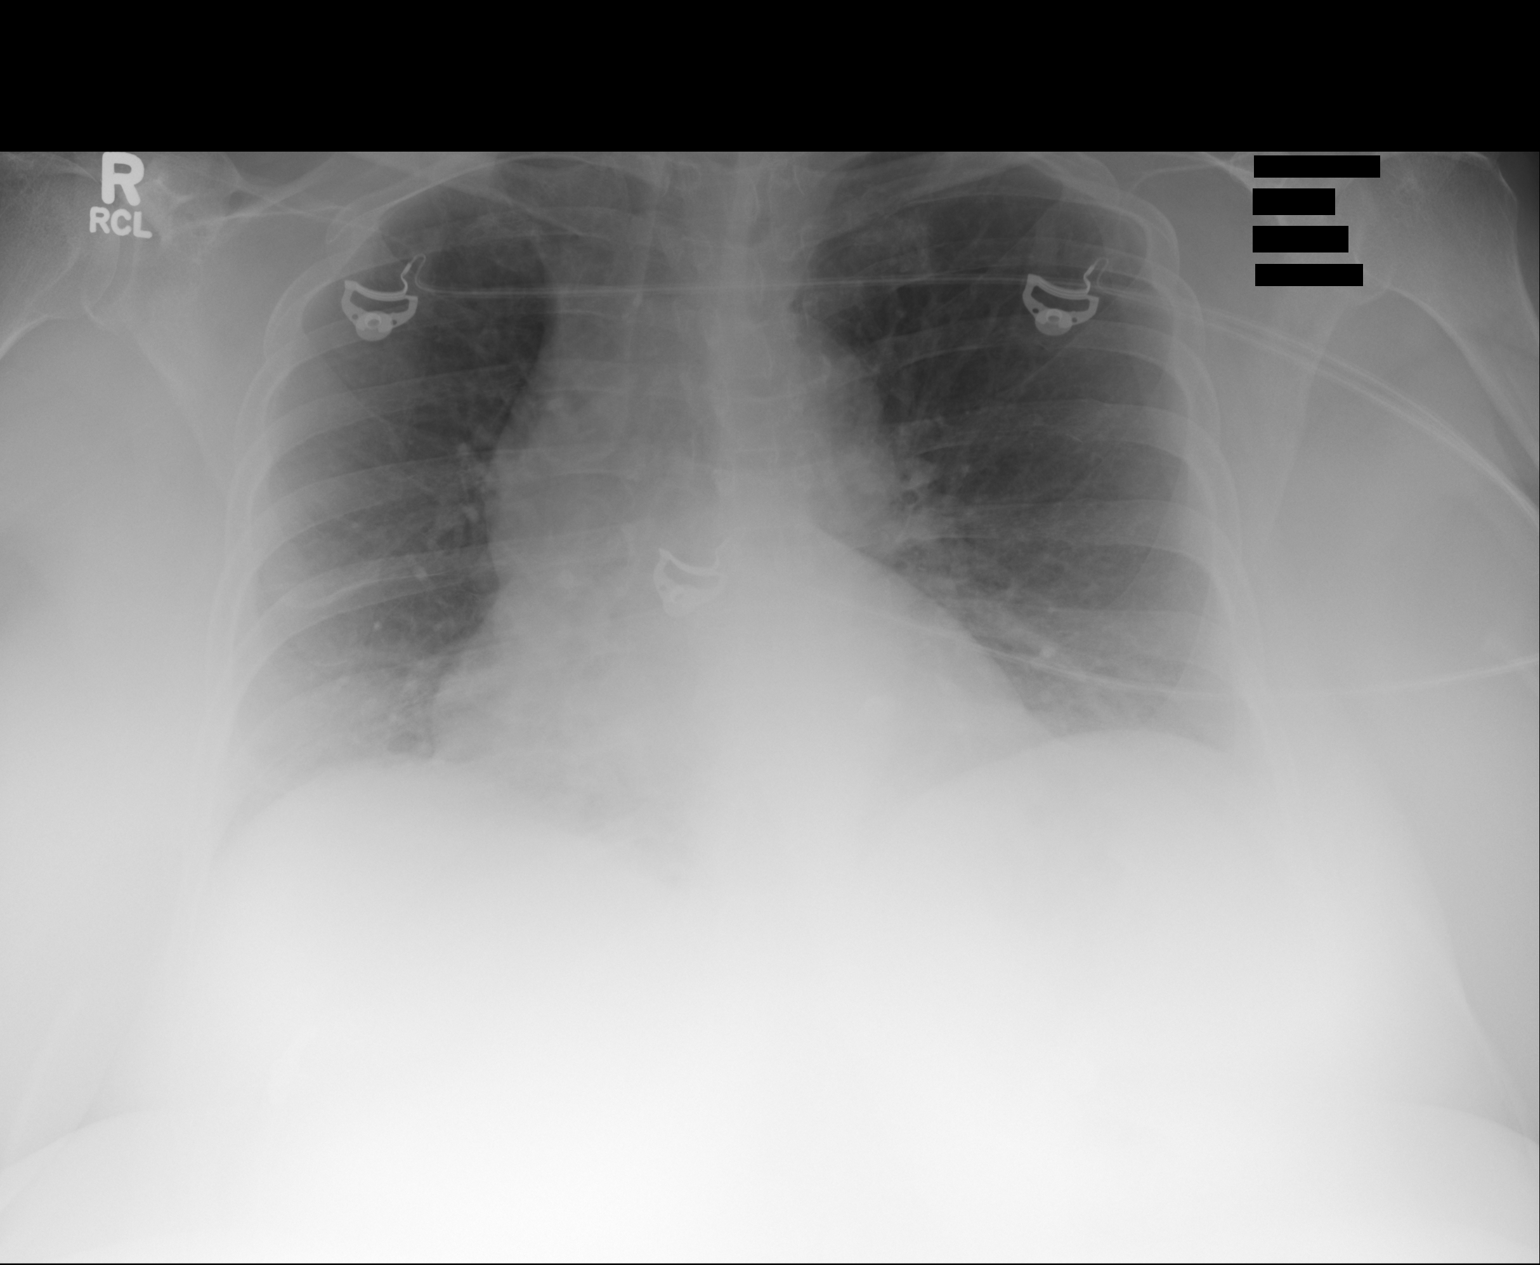

[1 of 1 positions shown; findings below may reference images not displayed]

FINDINGS: Heart size vascular pattern normal. Lungs are clear. Tortuosity of
the ascending and descending thoracic aorta, stable.
IMPRESSION: No active disease.

## 2016-03-10 IMAGING — CR DG SHOULDER 3+V*L*
1 series · 3 of 3 positions shown · non-contrast
Comparison: None.

CLINICAL DATA: Left shoulder pain, no known injury

EXAM:
DG SHOULDER 3+VIEWS LEFT

[Series 1: grashey · 0.17mm/px · 3 of 3 slices shown]
[im 1/3]
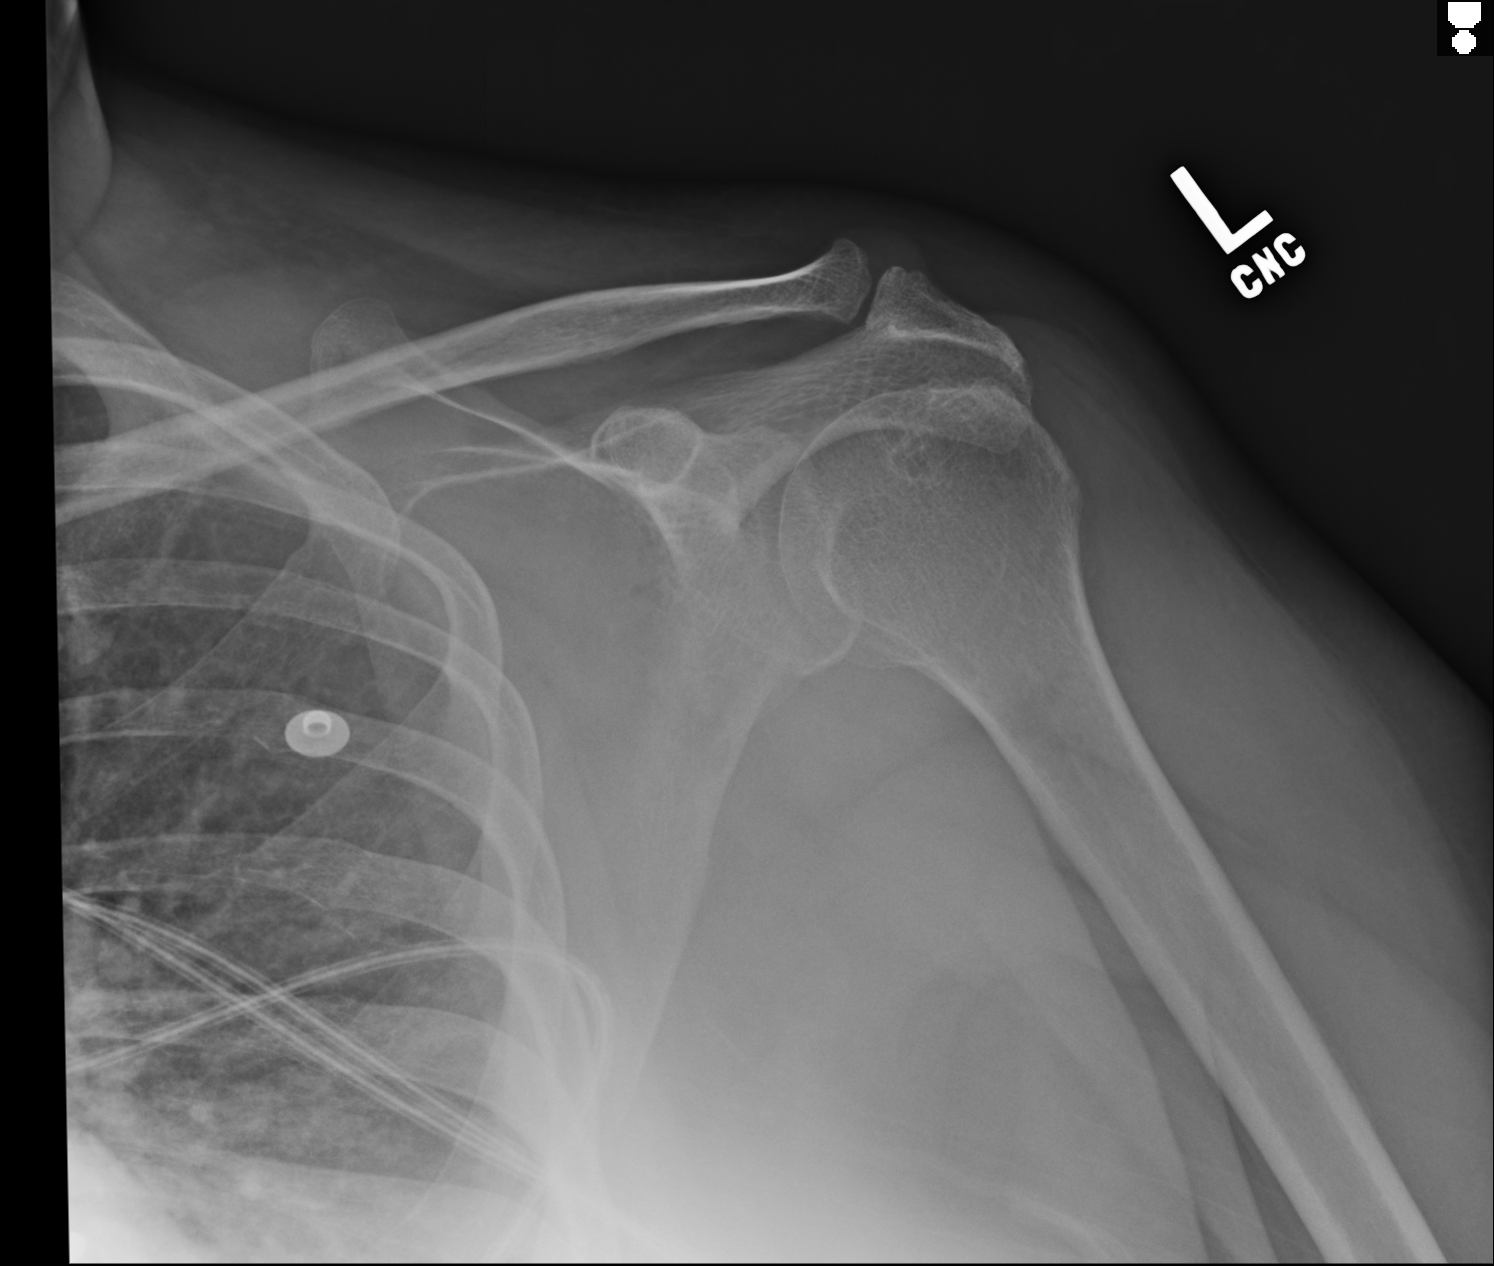
[im 2/3]
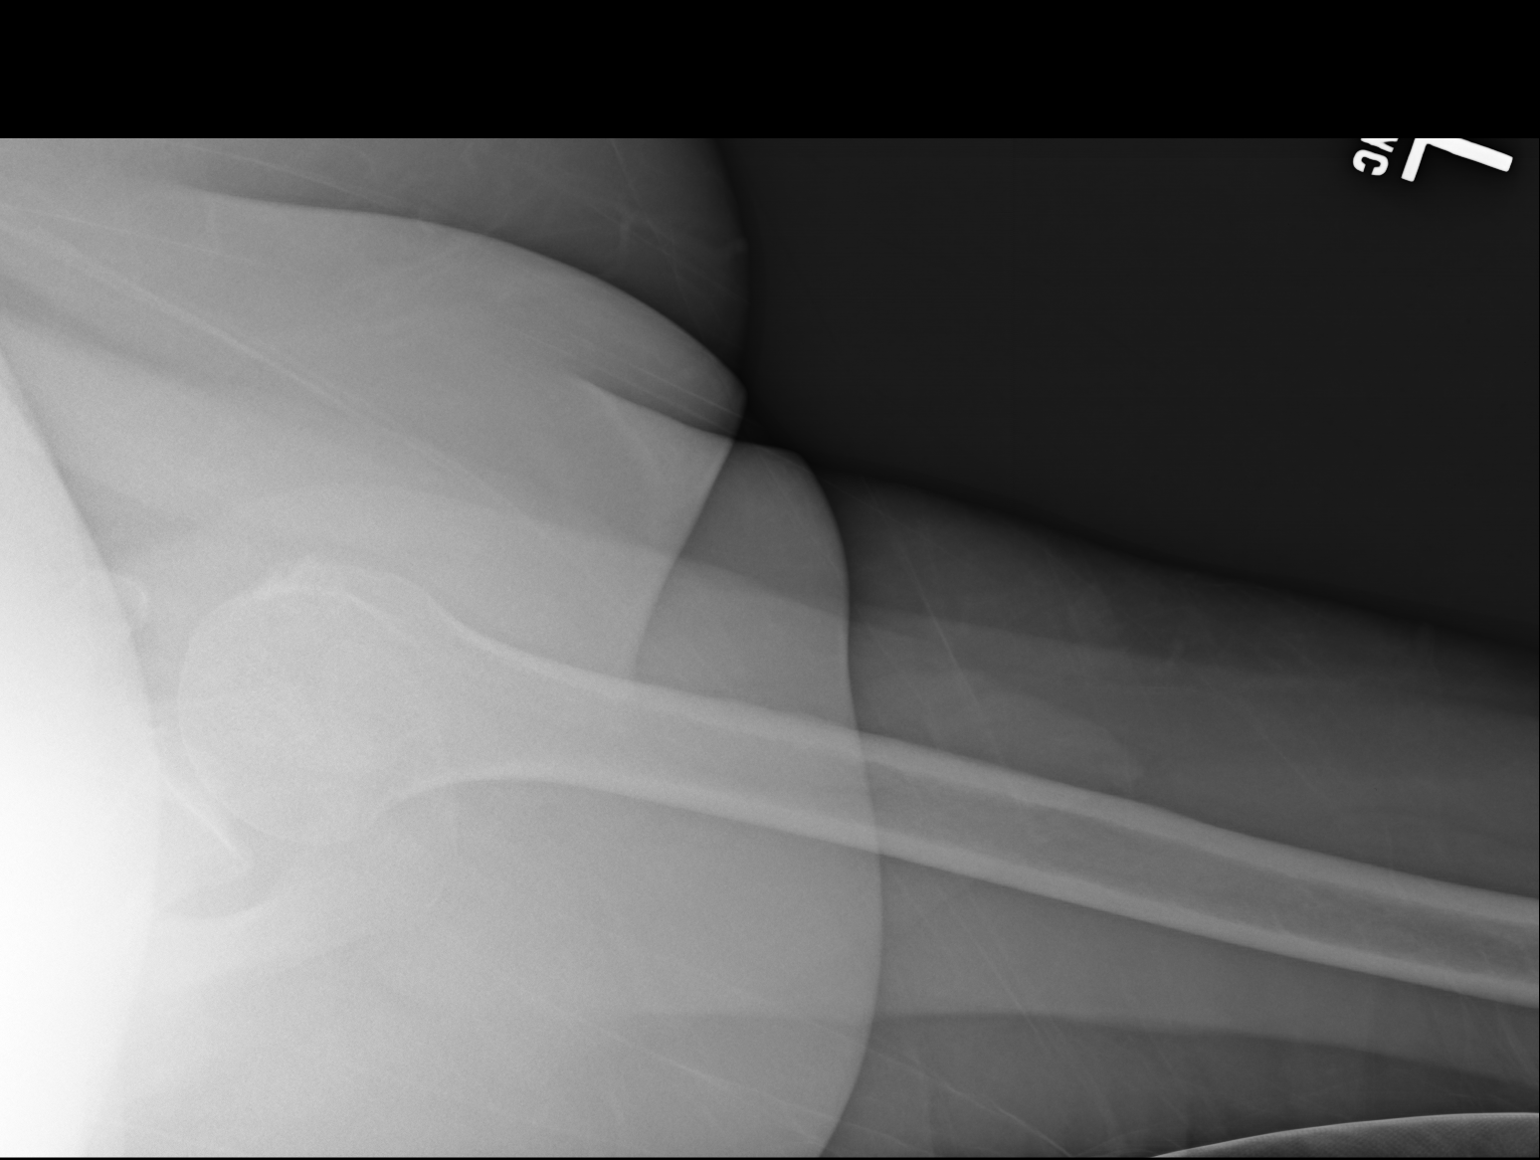
[im 3/3]
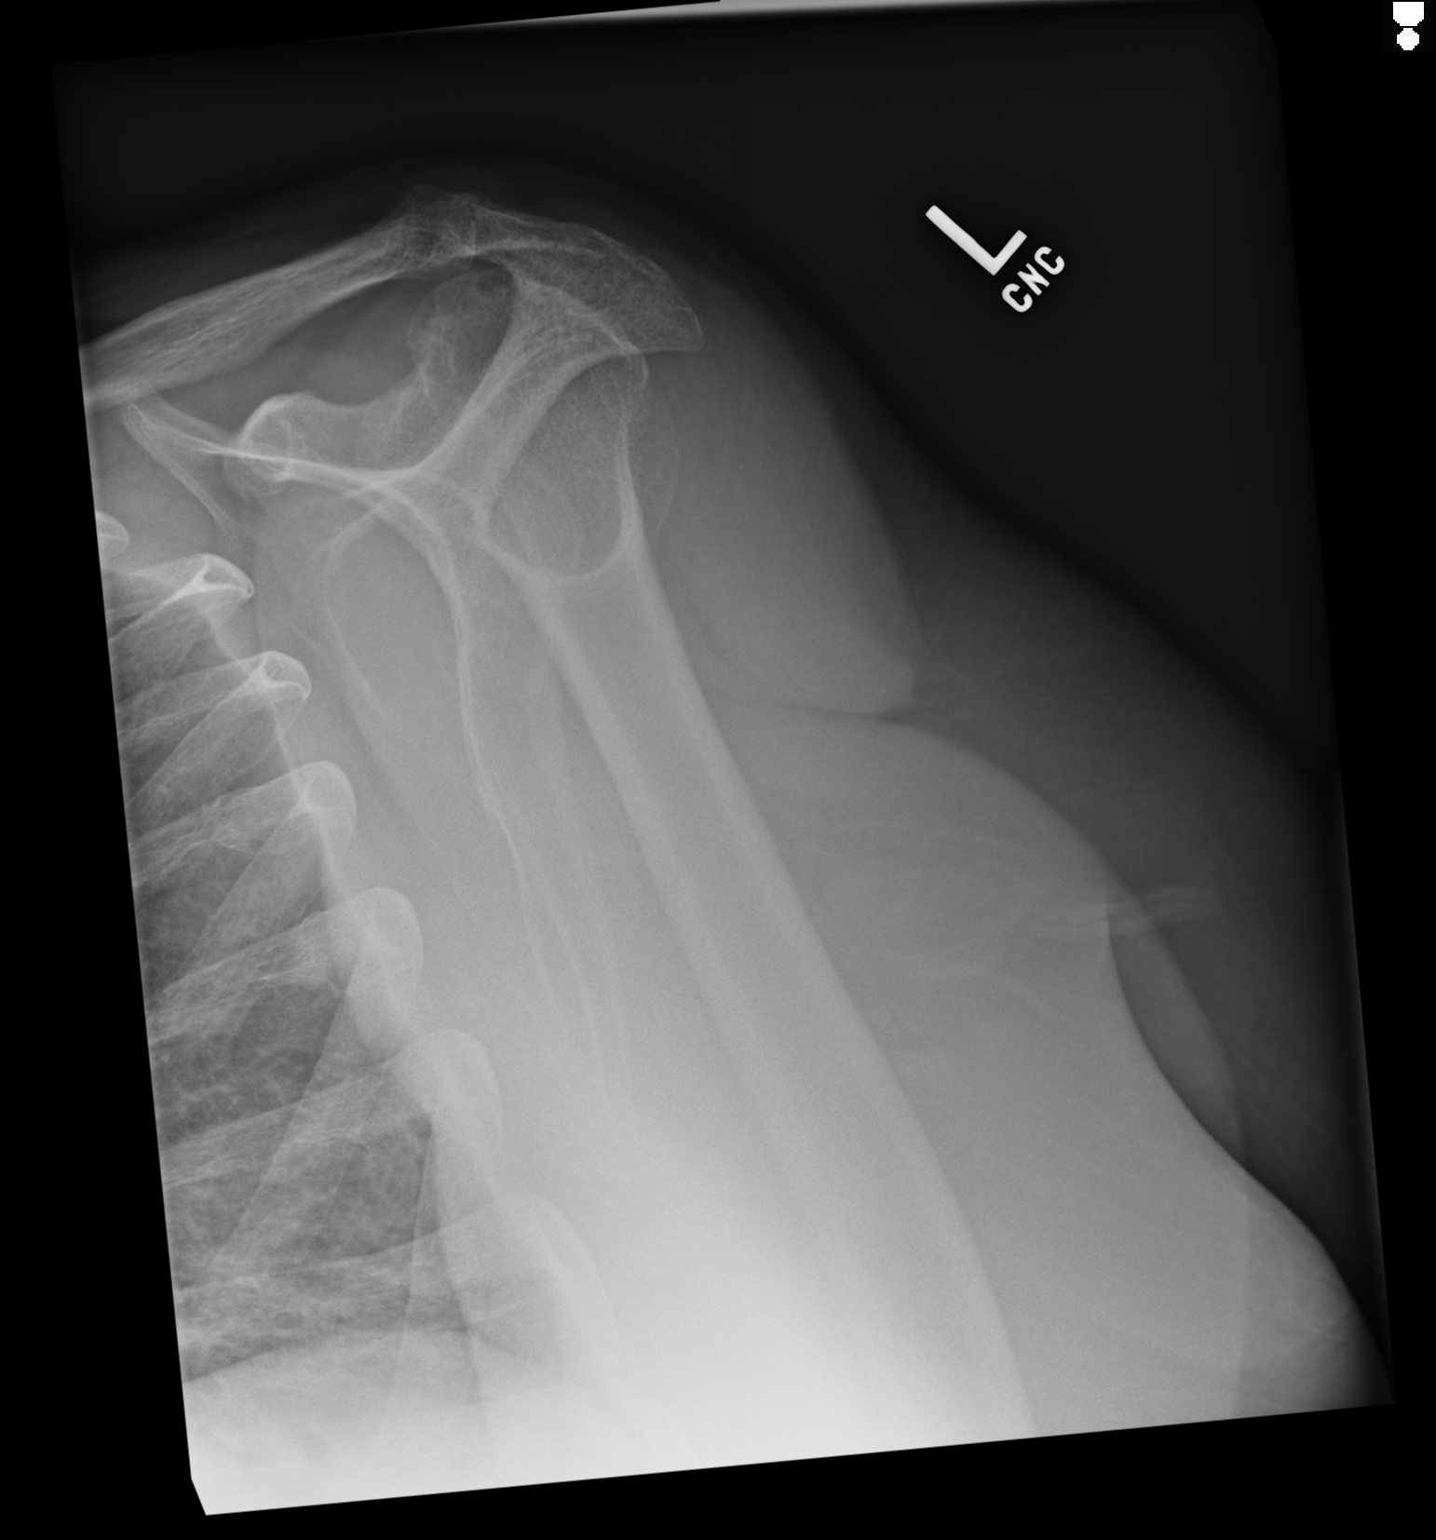

[3 of 3 positions shown; findings below may reference images not displayed]

FINDINGS: Mild degenerative change of the acromioclavicular joint. No fracture
or dislocation. No significant soft tissue abnormalities.
IMPRESSION: No acute findings

## 2016-03-14 ENCOUNTER — Encounter: Payer: Self-pay | Admitting: Obstetrics and Gynecology

## 2016-03-14 ENCOUNTER — Ambulatory Visit (INDEPENDENT_AMBULATORY_CARE_PROVIDER_SITE_OTHER): Payer: Medicare Other | Admitting: Obstetrics and Gynecology

## 2016-03-14 VITALS — BP 138/82 | HR 89 | Wt 257.6 lb

## 2016-03-14 DIAGNOSIS — N823 Fistula of vagina to large intestine: Secondary | ICD-10-CM

## 2016-03-14 DIAGNOSIS — G8929 Other chronic pain: Secondary | ICD-10-CM

## 2016-03-14 DIAGNOSIS — F419 Anxiety disorder, unspecified: Secondary | ICD-10-CM

## 2016-03-14 DIAGNOSIS — G479 Sleep disorder, unspecified: Secondary | ICD-10-CM | POA: Diagnosis not present

## 2016-03-14 DIAGNOSIS — N898 Other specified noninflammatory disorders of vagina: Secondary | ICD-10-CM

## 2016-03-14 DIAGNOSIS — L298 Other pruritus: Secondary | ICD-10-CM | POA: Diagnosis not present

## 2016-03-14 MED ORDER — OXYCODONE HCL 15 MG PO TABS: 7.5000 mg | ORAL_TABLET | Freq: Two times a day (BID) | ORAL | 0 refills | Status: DC | PRN

## 2016-03-14 MED ORDER — ALPRAZOLAM 1 MG PO TABS: 1.0000 mg | ORAL_TABLET | Freq: Two times a day (BID) | ORAL | 0 refills | Status: DC | PRN

## 2016-03-15 ENCOUNTER — Telehealth: Payer: Self-pay

## 2016-03-15 ENCOUNTER — Other Ambulatory Visit: Payer: Self-pay

## 2016-03-15 DIAGNOSIS — B182 Chronic viral hepatitis C: Secondary | ICD-10-CM

## 2016-03-15 NOTE — Telephone Encounter (Signed)
Called requesting an appointment for Dr. Linus Salmons stating that her all her doctors in [place] are saying her liver and pancreas is swollen. Explained to patient that Dr.Comer stated that she was cured of Hep C back on 09/12/2015 and that she should she her gastroenterologist or contact the doctors that are saying something is wrong with her liver to refer back to [place] if the doctors feel it is an infectious disease. Patient stated understanding. 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 Baxter Flattery. Asked patient whether she still had a PICC line. Patient stated no. Patient stated she wanted a referral to a pain clinic. Explained our doctors do not refer to pain clinics and she should contact her primary care. Patient stated understanding and said she will contact Dr. Christella Noa office so they can refer back here for her liver issues. Rodman Key, LPN

## 2016-03-15 NOTE — Telephone Encounter (Signed)
I will change her prescription and leave it at the front desk.  She is not receive the new prescription unless she returns the other one.

## 2016-03-15 NOTE — Telephone Encounter (Signed)
Pt calls and states that the rx she was given yesterday for oxycodone is for half a tablet but the pt states that a half tablet is ineffective for her, she wants to know if she can have an rx fro a whole tablet and return the other prescription. PT aware that provider is out of the office. Please advise.

## 2016-03-17 MED ORDER — OXYCODONE HCL 15 MG PO TABS: 15.0000 mg | ORAL_TABLET | Freq: Two times a day (BID) | ORAL | 0 refills | Status: DC | PRN

## 2016-03-17 MED ORDER — TRIAMCINOLONE ACETONIDE 0.025 % EX OINT: 1.0000 "application " | TOPICAL_OINTMENT | Freq: Two times a day (BID) | CUTANEOUS | 0 refills | Status: DC

## 2016-03-17 NOTE — Progress Notes (Signed)
GYNECOLOGY PROGRESS NOTE  Subjective:    Patient ID: [name], female    DOB: [birth year], 60 y.o.   MRN: [id number]  HPI  Patient is a 60 y.o.  female who presents for complaints of vaginal itching, and stool from vagina.  Patient notes that she has to douche multiple times per day to keep clean (uses just plain water).    Of note, patient reports that she is feeling depressed, notes that she is seeing a psychiatrist but doesn't think that it is helping. Reports problems sleeping, notes that she needs a refill on her Xanax.  Also notes that she is in chronic pain.  Is supposed to be seeing the pain clinic, but has not been seen yet.   The following portions of the patient's history were reviewed and updated as appropriate: allergies, current medications, past family history, past medical history, past social history, past surgical history and problem list.  Review of Systems Pertinent items noted in HPI and remainder of comprehensive ROS otherwise negative.   Objective:   Blood pressure 138/82, pulse 89, weight 257 lb 9.6 oz (116.8 kg). General appearance: alert and no distress Pelvic: external genitalia appears normal, no lesions, sparse pubic hair.  Internal exam with no vaginal discharge, no odor.  Bimanual exam deferred.  Extremities: extremities normal, atraumatic, no cyanosis or edema   Assessment:   Vaginal itching RV fistula (small) Chronic pain H/o anxiety/depression and sleep disturbance  Plan:   - Patient with continuous complaints of vaginal itching.  Notes that creams prescribed in the past have helped (steroid cream).  Will prescribe again.  No evidence of infection currently.   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 To continue with water douches as needed, bulking of stools with  fiber.  - Chronic pain - patient with a multitude of comorbidities.  Reports that she has been trying to find a new PCP but no one has been willing to tak her on as she is "too sick".  Advised that she did not need to see a family physician, she would benefit from seeing an internist.  States that she did find a physician that was willing to take her on until she could be referred to a specialist.  States that they gave her a short supply of pain meds (~ 10 days worth), but that she has run out.  Desires refill on pain medication. Discussed with patient that I had previously resolved to no longer prescribe her pain medications as it was not GYN related.  Discussed that as per our previous agreement, if she continued to solicit pain medications, then she would need to sign a waiver and could no longer be seen at the practice.  Patient notes that she desires to continue being a patient but that the pain is too great.  She would desire to receive her prescription for pain medications and is willing to sign dismissal letter.  Patient and physician signed letter, letter scanned to chart.    - H/o anxiety, with insomnia.  Patient desires refill on Xanax.  Final refill given. Advised to continue with therapist.  Patient currently has a therapist, but notes that she does not think it is helping.  Advised to seek new therapist if necessary.     A total of  15 minutes were spent face-to-face with the patient during this encounter and over half of that time dealt with counseling and coordination of care.   Rubie Maid, MD [facility]

## 2016-03-20 IMAGING — CR DG CHEST 1V PORT
1 series · 1 of 1 positions shown · non-contrast
Comparison: 03/31/2014

CLINICAL DATA: Upper back pain. Cough for 1 month. Shortness of
breath.

EXAM:
PORTABLE CHEST - 1 VIEW

[ap]
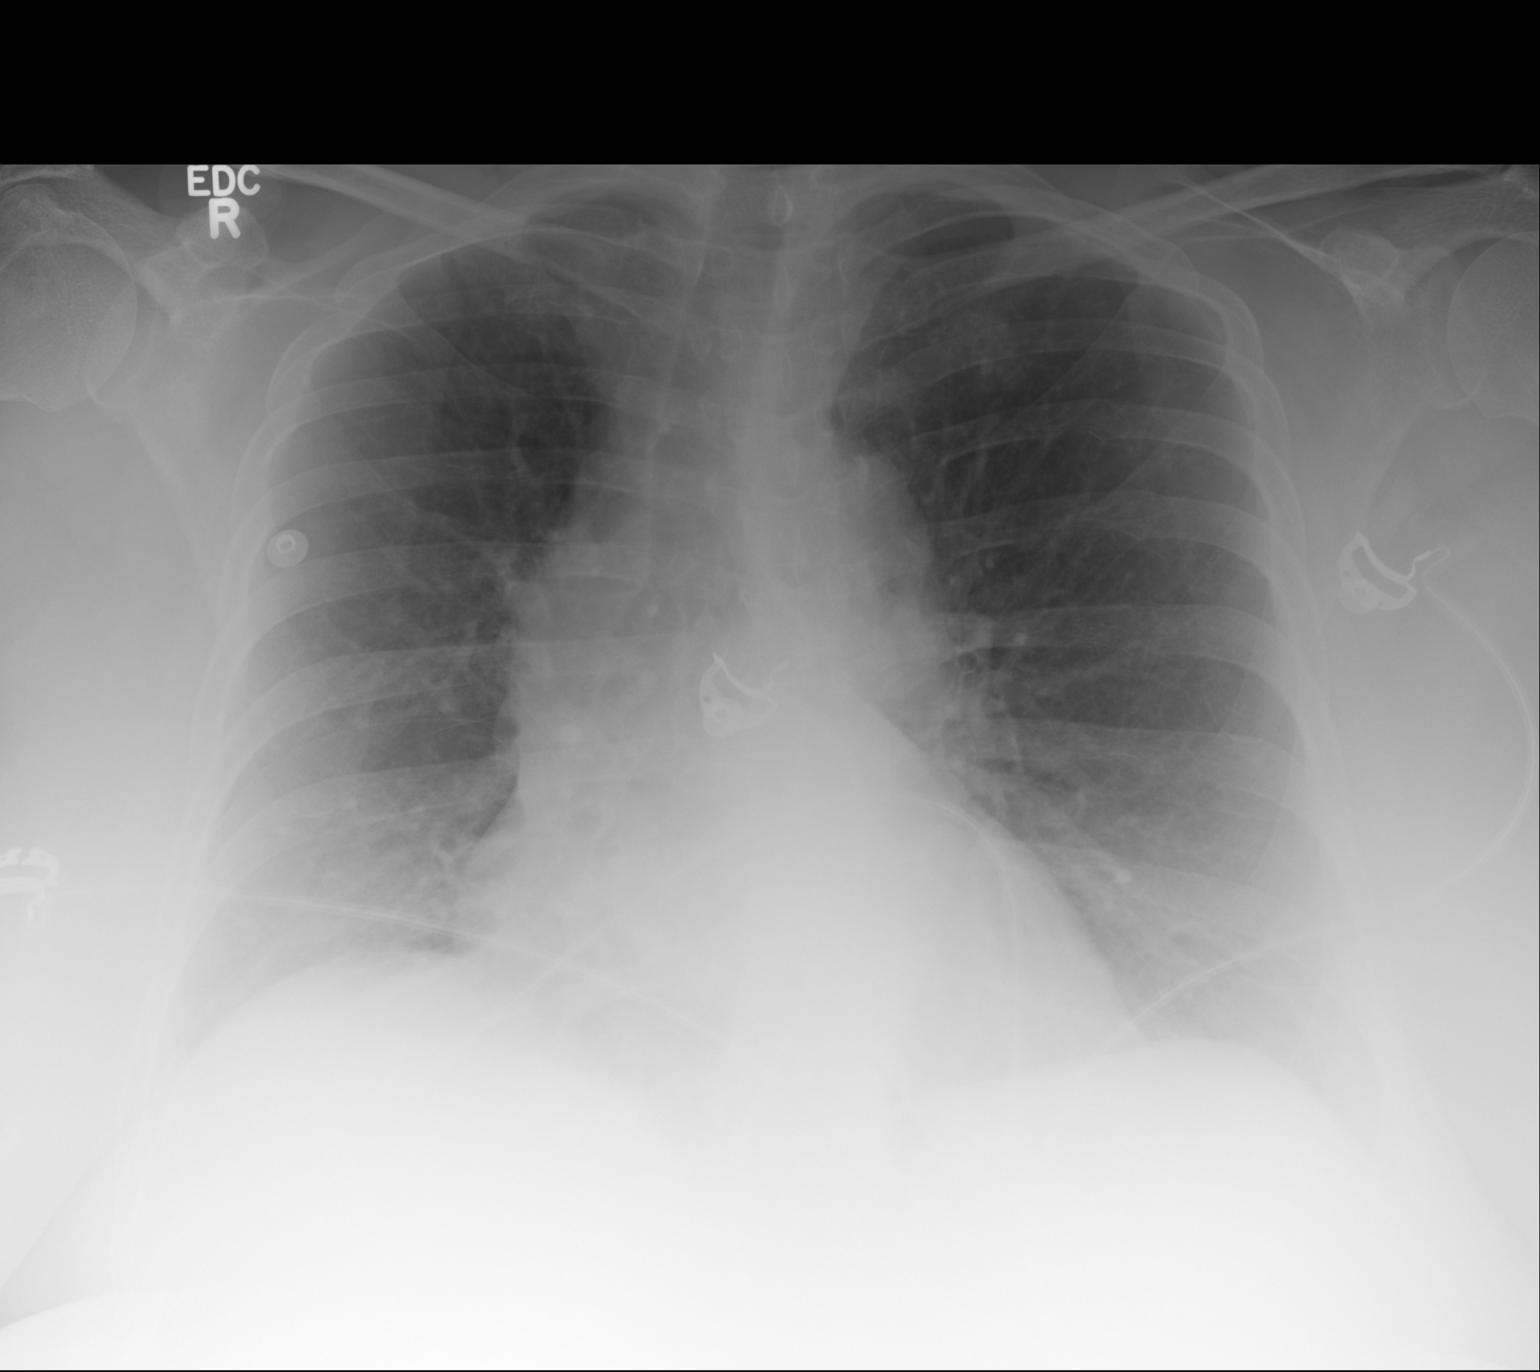

[1 of 1 positions shown; findings below may reference images not displayed]

FINDINGS: Shallow inspiration. Probable emphysematous changes in the upper
lungs. Tortuous aorta. The heart size and mediastinal contours are
within normal limits. Both lungs are clear. The visualized skeletal
structures are unremarkable.
IMPRESSION: No active disease.

## 2016-03-21 ENCOUNTER — Emergency Department: Payer: Medicare Other

## 2016-03-21 ENCOUNTER — Encounter: Payer: Self-pay | Admitting: Emergency Medicine

## 2016-03-21 ENCOUNTER — Emergency Department
Admission: EM | Admit: 2016-03-21 | Discharge: 2016-03-21 | Disposition: A | Discharge status: Home / Self Care | Payer: Medicare Other | Attending: Emergency Medicine | Admitting: Emergency Medicine

## 2016-03-21 DIAGNOSIS — R1030 Lower abdominal pain, unspecified: Secondary | ICD-10-CM | POA: Diagnosis present

## 2016-03-21 DIAGNOSIS — I130 Hypertensive heart and chronic kidney disease with heart failure and stage 1 through stage 4 chronic kidney disease, or unspecified chronic kidney disease: Secondary | ICD-10-CM | POA: Insufficient documentation

## 2016-03-21 DIAGNOSIS — Z8673 Personal history of transient ischemic attack (TIA), and cerebral infarction without residual deficits: Secondary | ICD-10-CM | POA: Diagnosis not present

## 2016-03-21 DIAGNOSIS — N189 Chronic kidney disease, unspecified: Secondary | ICD-10-CM | POA: Insufficient documentation

## 2016-03-21 DIAGNOSIS — Z79899 Other long term (current) drug therapy: Secondary | ICD-10-CM | POA: Insufficient documentation

## 2016-03-21 DIAGNOSIS — Z85841 Personal history of malignant neoplasm of brain: Secondary | ICD-10-CM | POA: Diagnosis not present

## 2016-03-21 DIAGNOSIS — E119 Type 2 diabetes mellitus without complications: Secondary | ICD-10-CM | POA: Diagnosis not present

## 2016-03-21 DIAGNOSIS — I509 Heart failure, unspecified: Secondary | ICD-10-CM | POA: Insufficient documentation

## 2016-03-21 DIAGNOSIS — N823 Fistula of vagina to large intestine: Secondary | ICD-10-CM | POA: Diagnosis not present

## 2016-03-21 DIAGNOSIS — F17210 Nicotine dependence, cigarettes, uncomplicated: Secondary | ICD-10-CM | POA: Insufficient documentation

## 2016-03-21 DIAGNOSIS — N390 Urinary tract infection, site not specified: Secondary | ICD-10-CM | POA: Diagnosis not present

## 2016-03-21 HISTORY — DX: Heart failure, unspecified: I50.9

## 2016-03-21 LAB — URINALYSIS COMPLETE WITH MICROSCOPIC (ARMC ONLY)
BILIRUBIN URINE: NEGATIVE
Glucose, UA: NEGATIVE mg/dL
Hgb urine dipstick: NEGATIVE
KETONES UR: NEGATIVE mg/dL
Nitrite: NEGATIVE
PROTEIN: NEGATIVE mg/dL
RBC / HPF: NONE SEEN RBC/hpf (ref 0–5)
Specific Gravity, Urine: 1.005 (ref 1.005–1.030)
pH: 6 (ref 5.0–8.0)

## 2016-03-21 LAB — COMPREHENSIVE METABOLIC PANEL
ALK PHOS: 297 U/L — AB (ref 38–126)
ALT: 28 U/L (ref 14–54)
AST: 58 U/L — AB (ref 15–41)
Albumin: 3.4 g/dL — ABNORMAL LOW (ref 3.5–5.0)
Anion gap: 9 (ref 5–15)
BILIRUBIN TOTAL: 0.6 mg/dL (ref 0.3–1.2)
BUN: 7 mg/dL (ref 6–20)
CO2: 22 mmol/L (ref 22–32)
CREATININE: 0.67 mg/dL (ref 0.44–1.00)
Calcium: 9.2 mg/dL (ref 8.9–10.3)
Chloride: 107 mmol/L (ref 101–111)
GFR calc Af Amer: >60 mL/min (ref >60)
Glucose, Bld: 155 mg/dL — ABNORMAL HIGH (ref 65–99)
Potassium: 3.4 mmol/L — ABNORMAL LOW (ref 3.5–5.1)
Sodium: 138 mmol/L (ref 135–145)
TOTAL PROTEIN: 8.8 g/dL — AB (ref 6.5–8.1)

## 2016-03-21 LAB — CBC WITH DIFFERENTIAL/PLATELET
BASOS ABS: 0 10*3/uL (ref 0–0.1)
Basophils Relative: 0 %
Eosinophils Absolute: 0.1 10*3/uL (ref 0–0.7)
Eosinophils Relative: 2 %
HEMATOCRIT: 29.6 % — AB (ref 35.0–47.0)
HEMOGLOBIN: 9.2 g/dL — AB (ref 12.0–16.0)
LYMPHS PCT: 25 %
Lymphs Abs: 1.1 10*3/uL (ref 1.0–3.6)
MCH: 22.2 pg — ABNORMAL LOW (ref 26.0–34.0)
MCHC: 31.3 g/dL — ABNORMAL LOW (ref 32.0–36.0)
MCV: 71.1 fL — AB (ref 80.0–100.0)
MONO ABS: 0.4 10*3/uL (ref 0.2–0.9)
Monocytes Relative: 10 %
NEUTROS ABS: 2.7 10*3/uL (ref 1.4–6.5)
Neutrophils Relative %: 63 %
Platelets: 163 10*3/uL (ref 150–440)
RBC: 4.16 MIL/uL (ref 3.80–5.20)
RDW: 21.3 % — AB (ref 11.5–14.5)
WBC: 4.3 10*3/uL (ref 3.6–11.0)

## 2016-03-21 LAB — LIPASE, BLOOD: LIPASE: 185 U/L — AB (ref 11–51)

## 2016-03-21 MED ORDER — PROMETHAZINE HCL 25 MG/ML IJ SOLN
INTRAMUSCULAR | Status: AC
  Administered 2016-03-21: 12.5 mg via INTRAVENOUS
  Filled 2016-03-21: qty 1

## 2016-03-21 MED ORDER — IOPAMIDOL (ISOVUE-300) INJECTION 61%
15.0000 mL | INTRAVENOUS | Status: AC
  Administered 2016-03-21 (×2): 15 mL via ORAL

## 2016-03-21 MED ORDER — MORPHINE SULFATE (PF) 4 MG/ML IV SOLN
4.0000 mg | Freq: Once | INTRAVENOUS | Status: AC
Start: 2016-03-21 — End: 2016-03-21
  Administered 2016-03-21: 4 mg via INTRAVENOUS

## 2016-03-21 MED ORDER — CEPHALEXIN 500 MG PO CAPS: 500.0000 mg | ORAL_CAPSULE | Freq: Two times a day (BID) | ORAL | 0 refills | Status: DC

## 2016-03-21 MED ORDER — DIPHENHYDRAMINE HCL 50 MG/ML IJ SOLN
25.0000 mg | Freq: Once | INTRAMUSCULAR | Status: AC
  Administered 2016-03-21: 25 mg via INTRAVENOUS

## 2016-03-21 MED ORDER — CEFTRIAXONE SODIUM 1 G IJ SOLR
1.0000 g | Freq: Once | INTRAMUSCULAR | Status: AC
  Administered 2016-03-21: 1 g via INTRAVENOUS
  Filled 2016-03-21: qty 10

## 2016-03-21 MED ORDER — MORPHINE SULFATE (PF) 4 MG/ML IV SOLN
INTRAVENOUS | Status: AC
  Administered 2016-03-21: 4 mg via INTRAVENOUS
  Filled 2016-03-21: qty 1

## 2016-03-21 MED ORDER — DIPHENHYDRAMINE HCL 50 MG/ML IJ SOLN
INTRAMUSCULAR | Status: AC
  Administered 2016-03-21: 25 mg via INTRAVENOUS
  Filled 2016-03-21: qty 1

## 2016-03-21 MED ORDER — MORPHINE SULFATE (PF) 4 MG/ML IV SOLN
4.0000 mg | Freq: Once | INTRAVENOUS | Status: AC
  Administered 2016-03-21: 4 mg via INTRAVENOUS
  Filled 2016-03-21: qty 1

## 2016-03-21 MED ORDER — IOPAMIDOL (ISOVUE-300) INJECTION 61%
100.0000 mL | Freq: Once | INTRAVENOUS | Status: AC | PRN
Start: 2016-03-21 — End: 2016-03-21
  Administered 2016-03-21: 100 mL via INTRAVENOUS

## 2016-03-21 MED ORDER — PROMETHAZINE HCL 25 MG/ML IJ SOLN
12.5000 mg | Freq: Once | INTRAMUSCULAR | Status: AC
  Administered 2016-03-21: 12.5 mg via INTRAVENOUS

## 2016-03-21 NOTE — ED Provider Notes (Signed)
-----------------------------------------   4:18 PM on 03/21/2016 -----------------------------------------   Blood pressure 104/82, pulse (!) 101, temperature 98.6 F (37 C), temperature source Oral, resp. rate 18, height 5\' 8"  (1.727 m), weight 116.6 kg, SpO2 98 %.  Assuming care from Dr. Reita Cliche.  In short, [name] is a 60 y.o. female with a chief complaint of Abscess and Emesis .  Refer to the original H&P for additional details.  The current plan of care is to follow up CT scan and dispo appropriately.     ----------------------------------------- 5:01 PM on 03/21/2016 -----------------------------------------  Ct Abdomen Pelvis W Contrast  Result Date: 03/21/2016 CLINICAL DATA:  Pt in via EMS from home with complaints of recurrent issues with a rectovaginal fistula which she has previously received surgical intervention for in 2010. Pt reports new black discharge, bleeding, pain and itching in rectum . EXAM: CT ABDOMEN AND PELVIS WITH CONTRAST TECHNIQUE: Multidetector CT imaging of the abdomen and pelvis was performed using the standard protocol following bolus administration of intravenous contrast. CONTRAST:  17mL ISOVUE-300 IOPAMIDOL (ISOVUE-300) INJECTION 61% COMPARISON:  CT 10/20/2015 FINDINGS: Lower chest: Lung bases are clear. Hepatobiliary: No focal hepatic lesion. No biliary duct dilatation. Gallbladder is normal. Common bile duct is normal. Pancreas: Pancreas is normal. No ductal dilatation. No pancreatic inflammation. Spleen: Normal spleen Adrenals/urinary tract: Adrenal glands and kidneys are normal. The ureters and bladder normal. Stomach/Bowel: Stomach, small-bowel and cecum normal. There is contrast throughout the colon presumably from the rectal contrast. The rectum appears normal. There is a low rectal anastomosis. Vascular/Lymphatic: Abdominal aorta is normal caliber with atherosclerotic calcification. There is no retroperitoneal or periportal lymphadenopathy. No  pelvic lymphadenopathy. Reproductive: Post hysterectomy. At the level the vaginal cuff small amount of high-density material is present but similar to other soft tissues tissues of the pelvis. Smaller gas within the fornix of the vagina which is not uncommon. Other: No free fluid. Musculoskeletal: No aggressive osseous lesion. IMPRESSION: 1. No clear evidence of communication between the rectum to the vagina. The rectum is well distended with rectal contrast. Low rectal anastomosis without evidence of leak. 2. Morphologic changes consists with cirrhosis.  No ascites . Electronically Signed   By: Suzy Bouchard M.D.   On: 03/21/2016 16:43     No acute findings on CT scan.  Patient comfortable in spite of chronic pain.  Will follow up as outpatient.   Hinda Kehr, MD 03/21/16 [id number]

## 2016-03-21 NOTE — ED Notes (Signed)
Patient transported to CT 

## 2016-03-21 NOTE — Discharge Instructions (Signed)
You have been seen in the Emergency Department (ED) for abdominal pain.  Your evaluation did not identify a clear cause of your symptoms but was generally reassuring.  Your CT scan was unchanged from prior.  You do appear to have a mild urinary tract infection for which we have given you a prescription for antibiotics.  Please follow up as instructed above regarding today?s emergent visit and the symptoms that are bothering you.  As we discussed, we cannot provide narcotics for chronic pain according to the [facility] Chronic Pain policy.  Return to the ED if your abdominal pain worsens or fails to improve, you develop bloody vomiting, bloody diarrhea, you are unable to tolerate fluids due to vomiting, fever greater than 101, or other symptoms that concern you.

## 2016-03-21 NOTE — ED Notes (Signed)
REturn from CT Scan. AAOx3.  Skin warm and dry. NAD

## 2016-03-21 NOTE — ED Notes (Signed)
CT notified that pt is finished with oral contrast. 

## 2016-03-21 NOTE — ED Provider Notes (Signed)
[facility] Emergency Department Provider Note ____________________________________________   I have reviewed the triage vital signs and the triage nursing note.  HISTORY  Chief Complaint Abscess and Emesis   Historian Patient  HPI [name] is a 60 y.o. female with many unfortunate medical issues including history of fairly recent subdural hematoma which was surgically evacuated, complicated by osteomyelitis of the skull and prolonged antibiotics by PICC, which is all been completed. She also has a history of recto-vaginal fistula (unclear from her history or note if she actually has had surgery, but states that she's been followed at [facility] for this), for which she states that rectal pain has been getting worse over the past 2 weeks. She states that she saw her gynecologist and received a steroid cream. She states that she is having stool passed from her vagina and more severe pain at her rectum. She does have chronic pain and states her primary care physician is Dr. Kary Kos and that she is supposed to be referred to a pain specialist. She is telling me that her pain today in her rectum is more severe than chronic pain.  Denies fevers. Some nausea with what sounds like maybe one emesis.  She is a history of hepatitis C which was treated and cleared per prior notes through infectious disease.    Past Medical History:  Diagnosis Date  . Allergy   . CHF (congestive heart failure) ([place])   . Chronic bronchitis ([place])   . Chronic kidney disease   . Cirrhosis ([place])   . Depression   . Diabetes mellitus without complication ([place])   . Diverticulitis   . Emphysema of lung ([place])   . GERD (gastroesophageal reflux disease)   . H/O drug abuse    Clean since 2009  . H/O ETOH abuse    Sober since 2009  . Hepatitis C   . Hypertension   . Malignant brain tumor ([place]) 2007  . Migraines   . Pancreatic ascites   . Pancreatitis, alcoholic 7001  . Rectovaginal fistula    . Seizures ([place])   . Stroke [facility]) 2007   during brain surgery  . Urine incontinence     Patient Active Problem List   Diagnosis Date Noted  . 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 Infection 02/26/2016  . Acute intractable headache 02/24/2016  . Acute headache 02/24/2016  . Osteomyelitis of skull ([place])   . Encephalopathy   . Status post craniectomy 12/28/2015  . Wound infection after surgery 12/26/2015  . UTI (lower urinary tract infection) 10/22/2015  . Chest pain on breathing   . SOB (shortness of breath)   . Wheezing   . Subdural hematoma ([place]) 08/16/2015  . Sepsis ([place]) 06/22/2015  . Orthostatic hypotension 05/15/2015  . ALC (alcoholic liver cirrhosis) ([place]) 12/23/2014  . Clinical depression 12/23/2014  . Anxiety 12/23/2014  . Diabetes mellitus, type 2 ([place]) 12/23/2014  . HTN (hypertension) 11/05/2014  . Liver cirrhosis ([place]) 07/19/2014  . 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 Chronic pancreatitis ([place]) 11/03/2012  . Seizure disorder ([place]) 11/03/2012  . Benign meningioma of brain ([place]) 11/03/2012  . Chronic pelvic pain in female 11/03/2012  . Hepatitis C virus infection without hepatic coma 11/03/2012  . UTI (urinary tract infection) 11/03/2012  . Alcohol abuse, unspecified 11/03/2012  . Chronic liver disease 11/03/2012  . Schizophrenia ([place]) 11/03/2012  . Narcotic abuse 11/03/2012  . 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 Breast screening 11/03/2012  . 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  Nondependent alcohol abuse 11/03/2012  . 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 Female genital symptoms 11/03/2012    Past Surgical History:  Procedure Laterality Date  . ABDOMINAL HYSTERECTOMY  1979  . APPENDECTOMY  1999  . BRAIN SURGERY  2007   malignant brain tumor  . CHOLECYSTECTOMY  2001  . CRANIOPLASTY N/A 09/15/2015   Procedure: Cranioplasty with retrieval of bone flap from abdominal pocket;  Surgeon:  Ashok Pall, MD;  Location: [place] NEURO ORS;  Service: Neurosurgery;  Laterality: N/A;  Cranioplasty with retrieval of bone flap from abdominal pocket  . CRANIOTOMY Right 08/16/2015   Procedure: Right Fronto-Temporal-Parietal Craniotomy for Evacuation of Hematoma with Bone Flap Placement in Abdomen;  Surgeon: Ashok Pall, MD;  Location: [facility] NEURO ORS;  Service: Neurosurgery;  Laterality: Right;  . CRANIOTOMY N/A 12/28/2015   Procedure: Craniectomy for wound debridement;  Surgeon: Ashok Pall, MD;  Location: [facility] NEURO ORS;  Service: Neurosurgery;  Laterality: N/A;  Craniectomy for wound debridement  . EYE SURGERY    . rectovaginal fistula repair w/ colostomy  2011  . REPLACEMENT TOTAL KNEE Left 2005    Prior to Admission medications   Medication Sig Start Date End Date Taking? Authorizing Provider  albuterol (PROVENTIL HFA;VENTOLIN HFA) 108 (90 Base) MCG/ACT inhaler Inhale 2 puffs into the lungs every 6 (six) hours as needed for wheezing or shortness of breath.    Historical Provider, MD  albuterol (PROVENTIL) (5 MG/ML) 0.5% nebulizer solution Take 2.5 mg by nebulization every 6 (six) hours as needed for wheezing or shortness of breath.     Historical Provider, MD  ALPRAZolam Duanne Moron) 1 MG tablet Take 1 tablet (1 mg total) by mouth 2 (two) times daily as needed for anxiety. 03/14/16   Rubie Maid, MD  amLODipine (NORVASC) 10 MG tablet Take 10 mg by mouth daily.    Historical Provider, MD  Blood Glucose Monitoring Suppl (FIFTY50 GLUCOSE METER 2.0) w/Device KIT ICD-10 E11.9 Check blood sugars once daily 09/04/15   Historical Provider, MD  cephALEXin (KEFLEX) 500 MG capsule Take 1 capsule (500 mg total) by mouth 2 (two) times daily. 03/21/16   Lisa Roca, MD  diphenhydrAMINE (BENADRYL) 25 mg capsule Take 1 capsule (25 mg total) by mouth every 8 (eight) hours as needed for itching. 10/25/15   Vaughan Basta, MD  Fluticasone-Salmeterol (ADVAIR) 250-50 MCG/DOSE AEPB Inhale 1 puff into the lungs 2 (two)  times daily.     Historical Provider, MD  furosemide (LASIX) 20 MG tablet Take 20 mg by mouth every morning.     Historical Provider, MD  gabapentin (NEURONTIN) 300 MG capsule Take 600 mg by mouth 2 (two) times daily.  12/11/15   Historical Provider, MD  Ipratropium-Albuterol (COMBIVENT) 20-100 MCG/ACT AERS respimat Inhale 2 puffs into the lungs every 6 (six) hours as needed for wheezing or shortness of breath.     Historical Provider, MD  levETIRAcetam (KEPPRA) 500 MG tablet Take 500 mg by mouth 2 (two) times daily.  10/18/15   Historical Provider, MD  lipase/protease/amylase (CREON) 12000 units CPEP capsule Take 24,000 Units by mouth 3 (three) times daily with meals.    Historical Provider, MD  omeprazole (PRILOSEC) 20 MG capsule Take 20 mg by mouth every morning.  11/29/15   Historical Provider, MD  oxyCODONE (ROXICODONE) 15 MG immediate release tablet Take 1 tablet (15 mg total) by mouth 2 (two) times daily as needed (pain mod sever). 03/17/16   Rubie Maid, MD  phenytoin (DILANTIN) 100  MG/4ML suspension Take 300 mg by mouth 2 (two) times daily.     Historical Provider, MD  promethazine (PHENERGAN) 25 MG tablet Take 25 mg by mouth every 8 (eight) hours as needed for nausea or vomiting.    Historical Provider, MD  spironolactone (ALDACTONE) 50 MG tablet Take 1 tablet (50 mg total) by mouth 2 (two) times daily. 10/25/15   Vaughan Basta, MD  tiotropium (SPIRIVA) 18 MCG inhalation capsule Place 18 mcg into inhaler and inhale daily as needed (shortness of breath).     Historical Provider, MD  triamcinolone (KENALOG) 0.025 % ointment Apply 1 application topically 2 (two) times daily. 03/17/16   Rubie Maid, MD  triamcinolone (NASACORT ALLERGY 24HR) 55 MCG/ACT AERO nasal inhaler Place 2 sprays into the nose daily as needed (for rhinitis).    Historical Provider, MD  zolpidem (AMBIEN) 5 MG tablet Take 1 tablet (5 mg total) by mouth at bedtime as needed for sleep. 02/28/16   Bettey Costa, MD    Allergies   Allergen Reactions  . Ibuprofen Other (See Comments)    Pt states that she is unable to take this medication because of her liver.    . Sulfa Antibiotics Hives  . Hydroxyzine Other (See Comments)    Causes crying   . Amoxicillin Itching, Rash and Other (See Comments)    Has patient had a PCN reaction causing immediate rash, facial/tongue/throat swelling, SOB or lightheadedness with hypotension: No Has patient had a PCN reaction causing severe rash involving mucus membranes or skin necrosis: No Has patient had a PCN reaction that required hospitalization No Has patient had a PCN reaction occurring within the last 10 years: Yes If all of the above answers are "NO", then may proceed with Cephalosporin use.  . Chocolate Rash  . Ciprofloxacin Itching and Rash  . Dilaudid [Hydromorphone Hcl] Itching  . Fentanyl Rash  . Hydromorphone Itching  . Metformin Nausea And Vomiting  . Penicillins Itching, Rash and Other (See Comments)    Has patient had a PCN reaction causing immediate rash, facial/tongue/throat swelling, SOB or lightheadedness with hypotension: No Has patient had a PCN reaction causing severe rash involving mucus membranes or skin necrosis: No Has patient had a PCN reaction that required hospitalization No Has patient had a PCN reaction occurring within the last 10 years: Yes If all of the above answers are "NO", then may proceed with Cephalosporin use.  . Strawberry Extract Rash  . Zofran [Ondansetron Hcl] Itching and Nausea And Vomiting    Family History  Problem Relation Age of Onset  . Hypertension Mother   . Heart disease Father   . Diabetes Father   . Cancer Sister     brain  . Cancer Grandchild 8    brain tumor    Social History Social History  Substance Use Topics  . Smoking status: Current Every Day Smoker    Packs/day: 0.50    Types: Cigarettes  . Smokeless tobacco: Never Used     Comment: down to 3 cig's a day  . Alcohol use 2.4 oz/week    4 Cans of  beer per week    Review of Systems  Constitutional: Negative for fever. Eyes: Negative for visual changes. ENT: Negative for sore throat. Cardiovascular: Negative for chest pain. Respiratory: Negative for shortness of breath. Gastrointestinal: As per history of present illness. Genitourinary: Negative for dysuria. Musculoskeletal: Negative for back pain. Skin: Negative for rash. Neurological: Negative for headache. 10 point Review of Systems otherwise negative ____________________________________________  PHYSICAL EXAM:  VITAL SIGNS: ED Triage Vitals  Enc Vitals Group     BP 03/21/16 1131 115/78     Pulse Rate 03/21/16 1131 (!) 107     Resp 03/21/16 1131 13     Temp 03/21/16 1131 98.6 F (37 C)     Temp Source 03/21/16 1131 Oral     SpO2 03/21/16 1131 93 %     Weight 03/21/16 1133 257 lb (116.6 kg)     Height 03/21/16 1133 _0  (1.727 m)     Head Circumference --      Peak Flow --      Pain Score 03/21/16 1133 10     Pain Loc --      Pain Edu? --      Excl. in [place]? --      Constitutional: Alert and oriented. Well appearing and in no distress, although then when she gets a twinge of proctalgia. HEENT   Head: Skull abnormality from prior surgery.      Eyes: Conjunctivae are normal. PERRL. Normal extraocular movements.      Ears:         Nose: No congestion/rhinnorhea.   Mouth/Throat: Mucous membranes are moist.   Neck: No stridor. Cardiovascular/Chest: Normal rate, regular rhythm.  No murmurs, rubs, or gallops. Respiratory: Normal respiratory effort without tachypnea nor retractions. Breath sounds are clear and equal bilaterally. No wheezes/rales/rhonchi. Gastrointestinal: Soft. No distention, no guarding, no rebound. Moderate tenderness very low/mid and low abdomen.  Genitourinary/rectal: Yellow foul drainage from the vaginal area. Musculoskeletal: Nontender with normal range of motion in all extremities. No joint effusions.  No lower extremity  tenderness.  No edema. Neurologic:  Normal speech and language. No gross or focal neurologic deficits are appreciated. Skin:  Skin is warm, dry and intact. No rash noted. Psychiatric: Mood and affect are normal. Speech and behavior are normal. Patient exhibits appropriate insight and judgment.   ____________________________________________  LABS (pertinent positives/negatives)  Labs Reviewed  CBC WITH DIFFERENTIAL/PLATELET - Abnormal; Notable for the following:       Result Value   Hemoglobin 9.2 (*)    HCT 29.6 (*)    MCV 71.1 (*)    MCH 22.2 (*)    MCHC 31.3 (*)    RDW 21.3 (*)    All other components within normal limits  COMPREHENSIVE METABOLIC PANEL - Abnormal; Notable for the following:    Potassium 3.4 (*)    Glucose, Bld 155 (*)    Total Protein 8.8 (*)    Albumin 3.4 (*)    AST 58 (*)    Alkaline Phosphatase 297 (*)    All other components within normal limits  LIPASE, BLOOD - Abnormal; Notable for the following:    Lipase 185 (*)    All other components within normal limits  URINALYSIS COMPLETEWITH MICROSCOPIC ([facility] ONLY) - Abnormal; Notable for the following:    Color, Urine YELLOW (*)    APPearance CLEAR (*)    Leukocytes, UA 1+ (*)    Bacteria, UA RARE (*)    Squamous Epithelial / LPF 0-5 (*)    All other components within normal limits  URINE CULTURE    ____________________________________________    EKG I, Lisa Roca, MD, the attending physician have personally viewed and interpreted all ECGs.  None ____________________________________________  RADIOLOGY All Xrays were viewed by me. Imaging interpreted by Radiologist.  CT abd and pelvis with contrast: Pending  __________________________________________  PROCEDURES  Procedure(s) performed: None  Critical Care performed:  None  ____________________________________________   ED COURSE / ASSESSMENT AND PLAN  Pertinent labs & imaging results that were available during my care of the  patient were reviewed by me and considered in my medical decision making (see chart for details).  I reviewed the prior hospital no which case patient had been seen by psychiatrist Dr. Weber Cooks and questioned her mental capacity for decision making. Here she seems pretty cognizant of her own history and seems to give accurate information.  I reviewed her recent GYn appointment with Dr. Marcelline Mates -- patient was reportedly requesting pain medications and stated she did not have an established primary doctor.  She is telling me that she follows with Dr. Kary Kos and was considering a change.    Her complaints today are related to rectovaginal fistula.  Given her pain issues, it hard to tell whether or not this is complaining of chronic pain, but patient herself states that this is worse than typical chronic pain. On external vaginal exam there is foul discharge from the vaginal area, so I do think it's necessary to examine further with a CT scan to make sure that there is no abscess that would need some sort of surgical intervention.   Urinalysis consistent with uti.  Multiple allergies, looks like ok for cephalosporins.  A culture was sent.  Labs are reassuring overall, hemoglobin stable from prior.  No elevated wbc ct.  Elevated lipase, but no upper abdominal pain on history or exam.    4:00 I reviewed with her and she is comfortable going home. She did ask me for narcotic medication at night told her that I do not feel comfortable prescribing narcotic pain medication as she has an appoint with her pain specialist this week. Dr. Manus Rudd will review the CT scan and if no emergency surgical findings or medical findings, patient likely to be discharged.  CONSULTATIONS:  None  Patient / Family / Caregiver informed of clinical course, medical decision-making process, and agree with plan.   I discussed return precautions, follow-up instructions, and discharge instructions with patient and/or  family.   ___________________________________________   FINAL CLINICAL IMPRESSION(S) / ED DIAGNOSES   Final diagnoses:  Rectovaginal fistula  Lower abdominal pain  UTI (lower urinary tract infection)              Note: This dictation was prepared with Dragon dictation. Any transcriptional errors that result from this process are unintentional    Lisa Roca, MD 03/21/16 [phone]

## 2016-03-21 NOTE — ED Triage Notes (Signed)
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  Pt reports new black discharge, bleeding, pain and itching in rectum x approximately 2 weeks.  Pt seen by OBGYN on Monday with prescriptions given for 2 creams but pt reports no relief since then.  Pt A/Ox4, tachycardic upon arrival, other vitals WDL, no immediate distress noted.

## 2016-03-23 LAB — URINE CULTURE

## 2016-03-26 ENCOUNTER — Telehealth: Payer: Self-pay | Admitting: Obstetrics and Gynecology

## 2016-03-26 NOTE — Telephone Encounter (Signed)
Called pharmacy, pt's daughter brought previous script back and was given new rx.

## 2016-03-26 NOTE — Telephone Encounter (Signed)
So sorry but pharmacy called and asked could RX od Oxecodone be changed. Please call pharmacist @ [facility]

## 2016-03-26 NOTE — Telephone Encounter (Signed)
FYI: pts daughter came in today 03/26/16 at 4:00 and brought in the old narcotic RX and I verified with Charlott Rakes the new that was up front and she said that was correct, handed daughter the new RX, Charlott Rakes took old RX.

## 2016-04-01 ENCOUNTER — Telehealth: Payer: Self-pay | Admitting: Obstetrics and Gynecology

## 2016-04-01 NOTE — Telephone Encounter (Signed)
Call not urgent, reiterated practice dismissal to patient.

## 2016-04-01 NOTE — Telephone Encounter (Signed)
PT LEFT OFFICE VM STATING SHE WOULD LIKE A CALL BACK.

## 2016-04-03 ENCOUNTER — Telehealth: Payer: Self-pay | Admitting: Obstetrics and Gynecology

## 2016-04-03 NOTE — Telephone Encounter (Signed)
Please inform her that she has several refills on the Diflucan if she thinks it is due to a worsening yeast infection. However if the pills aren't working, she can be prescribed Terazole cream which may actually work better. Her previous exams have not really noted any discharge in the vagina.

## 2016-04-03 NOTE — Telephone Encounter (Signed)
Carlyne asked if you would send an antibiotic to [facility]. She said her vagina is very swollen and itchy and lots worse than when you saw her.

## 2016-04-08 ENCOUNTER — Other Ambulatory Visit: Payer: Self-pay | Admitting: Orthopedic Surgery

## 2016-04-08 DIAGNOSIS — M75122 Complete rotator cuff tear or rupture of left shoulder, not specified as traumatic: Secondary | ICD-10-CM

## 2016-04-12 ENCOUNTER — Other Ambulatory Visit: Payer: Self-pay

## 2016-04-15 ENCOUNTER — Ambulatory Visit: Payer: Medicare Other | Admitting: Gastroenterology

## 2016-04-16 ENCOUNTER — Telehealth: Payer: Self-pay | Admitting: *Deleted

## 2016-04-16 NOTE — Telephone Encounter (Signed)
Called the patient to try and schedule for a visit for Hepatitis C and she refused to come to [place]. She states she does not drive and she can not get here from where she lives. She asked if there was an office in [place] she could go to for treatment. Advised her not sure but will let the referring clinic know of her desire to be seen closer to home.  Called referring clinic [phone] and advised patient does not want to come to [facility] and suggested they refer to Dr Blane Ohara office.

## 2016-04-17 ENCOUNTER — Encounter: Payer: Self-pay | Admitting: Internal Medicine

## 2016-04-19 ENCOUNTER — Ambulatory Visit
Admission: RE | Admit: 2016-04-19 | Discharge: 2016-04-19 | Disposition: A | Discharge status: Home / Self Care | Payer: Medicare Other | Source: Ambulatory Visit | Attending: Orthopedic Surgery | Admitting: Orthopedic Surgery

## 2016-04-19 DIAGNOSIS — M75122 Complete rotator cuff tear or rupture of left shoulder, not specified as traumatic: Secondary | ICD-10-CM | POA: Insufficient documentation

## 2016-04-19 DIAGNOSIS — M7552 Bursitis of left shoulder: Secondary | ICD-10-CM | POA: Diagnosis not present

## 2016-04-19 DIAGNOSIS — M7582 Other shoulder lesions, left shoulder: Secondary | ICD-10-CM | POA: Insufficient documentation

## 2016-04-21 IMAGING — CR DG SHOULDER 3+V*L*
1 series · 3 of 3 positions shown · non-contrast
Comparison: 03/31/2014.

CLINICAL DATA: New left shoulder pain for the past week. No known
injury.

EXAM:
DG SHOULDER 3+VIEWS LEFT

[Series 1: dxr shoulder left complete · 0.14mm/px · 3 of 3 slices shown]
[im 1/3]
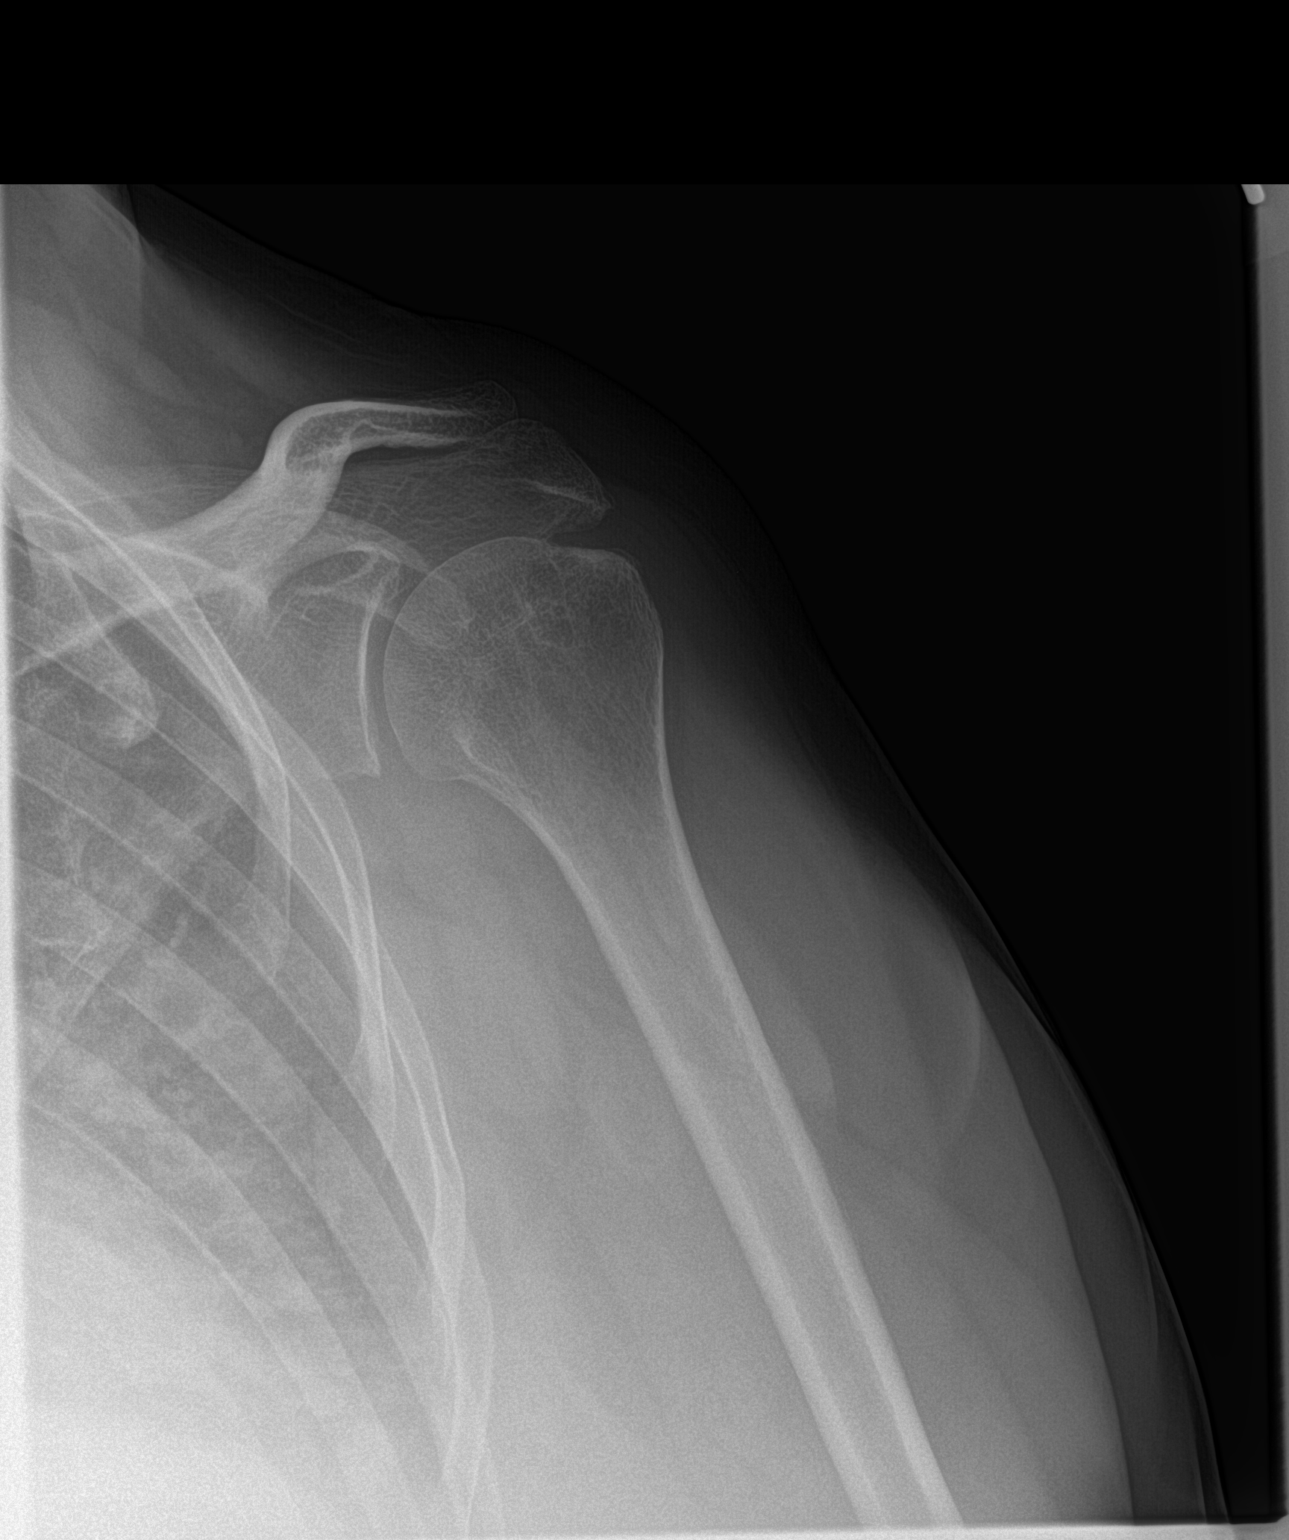
[im 2/3]
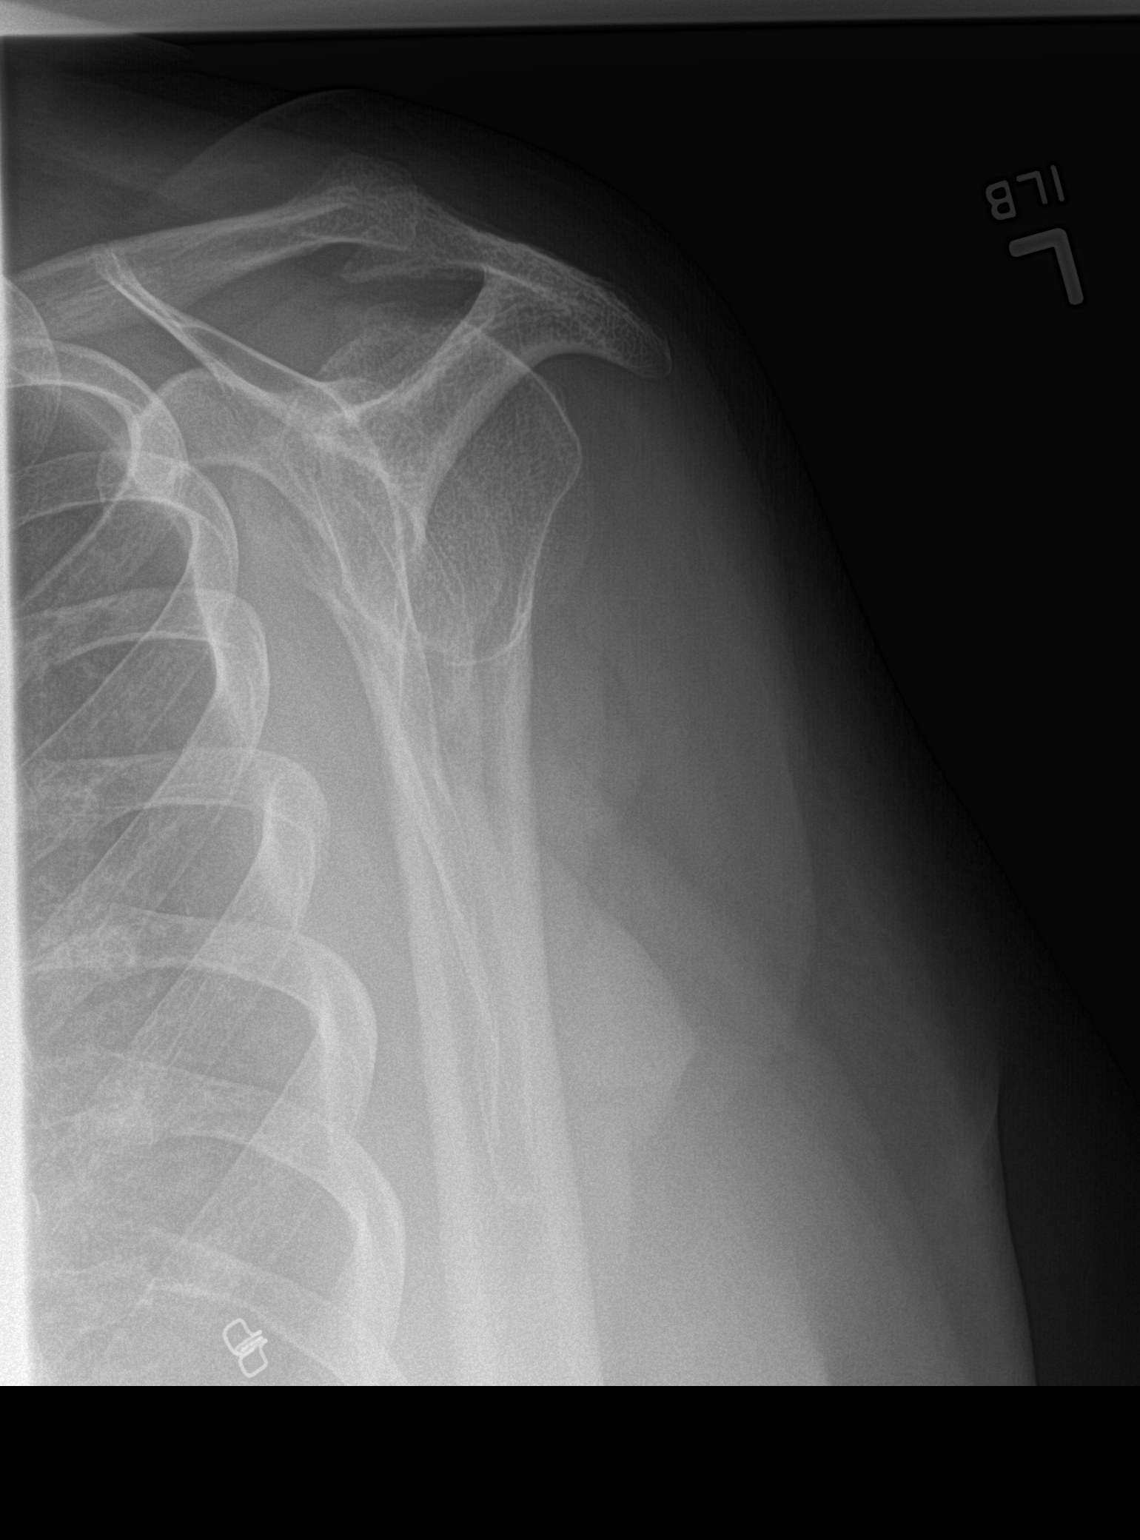
[im 3/3]
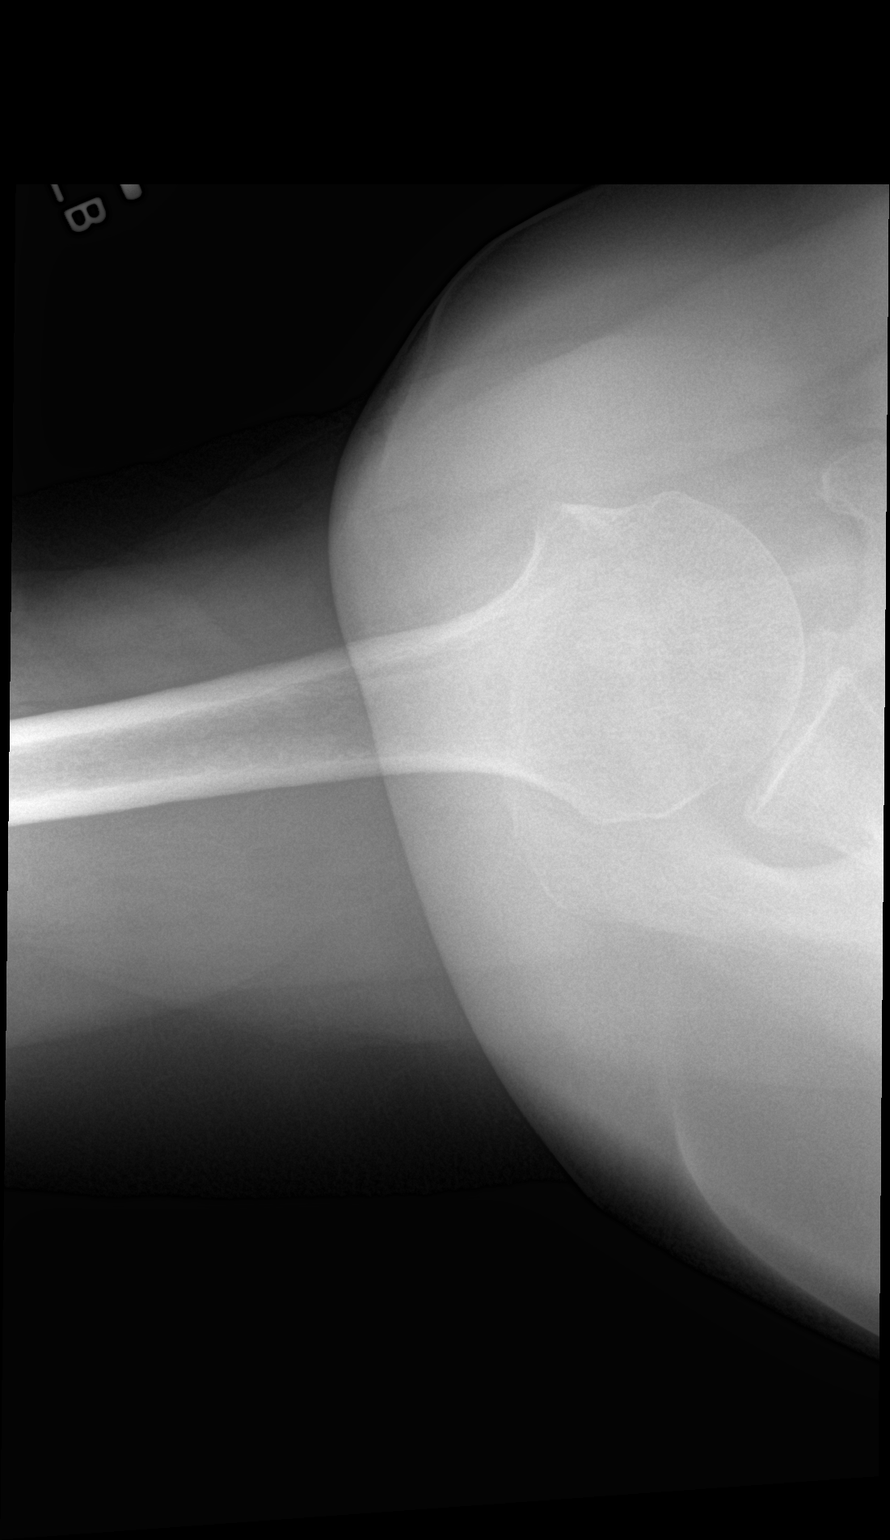

[3 of 3 positions shown; findings below may reference images not displayed]

FINDINGS: Minimal inferior glenoid spur formation. Otherwise, unremarkable
bones and soft tissues.
IMPRESSION: Minimal degenerative change.

## 2016-04-23 NOTE — Telephone Encounter (Addendum)
Received call from Knox at [facility] to call her back with an appointment for this patient. Returned call to advise her that when I spoke with the patient 04/16/16 she refused to come to [facility] for an appointment and that I had called the office to notify. Had to leave a message for her to call me back and unless something has changed patient will not be scheduled.

## 2016-04-26 DIAGNOSIS — M7582 Other shoulder lesions, left shoulder: Secondary | ICD-10-CM | POA: Insufficient documentation

## 2016-04-26 DIAGNOSIS — S46102A Unspecified injury of muscle, fascia and tendon of long head of biceps, left arm, initial encounter: Secondary | ICD-10-CM | POA: Insufficient documentation

## 2016-04-26 DIAGNOSIS — M75122 Complete rotator cuff tear or rupture of left shoulder, not specified as traumatic: Secondary | ICD-10-CM | POA: Insufficient documentation

## 2016-05-13 ENCOUNTER — Other Ambulatory Visit: Payer: Self-pay | Admitting: Obstetrics and Gynecology

## 2016-05-13 ENCOUNTER — Inpatient Hospital Stay: Admission: RE | Admit: 2016-05-13 | Payer: Medicare Other | Source: Ambulatory Visit

## 2016-05-14 ENCOUNTER — Inpatient Hospital Stay: Admission: RE | Admit: 2016-05-14 | Payer: Medicare Other | Source: Ambulatory Visit

## 2016-05-17 ENCOUNTER — Inpatient Hospital Stay: Admission: RE | Admit: 2016-05-17 | Payer: Medicare Other | Source: Ambulatory Visit

## 2016-05-20 IMAGING — MR MRI OF THE LEFT SHOULDER WITHOUT CONTRAST
5 series · 40 of 40 positions shown · non-contrast
Comparison: Plain films left shoulder 03/31/2014 and 05/12/2014.

CLINICAL DATA: Left shoulder pain and decreased range of motion.
History of fall 1 month ago.

EXAM:
MRI OF THE LEFT SHOULDER WITHOUT CONTRAST
TECHNIQUE: Multiplanar, multisequence MR imaging of the shoulder was performed.
No intravenous contrast was administered.

[Series 3: T2 fat-sat · axial · 4.0mm · 0.55mm/px · z∈[-7,+89]mm · 8 of 23 slices shown (1 of 3)]
[im 1/23]
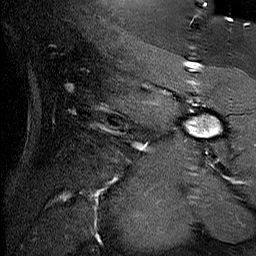
[im 4/23]
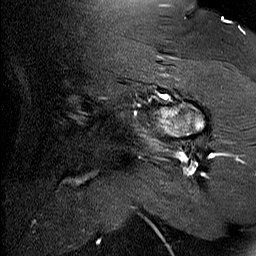
[im 7/23]
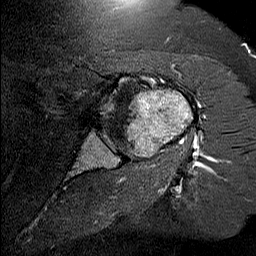
[im 10/23]
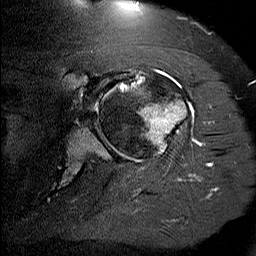
[im 13/23]
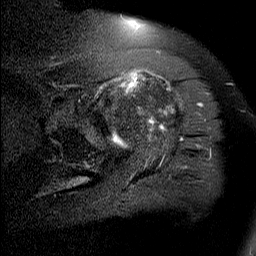
[im 16/23]
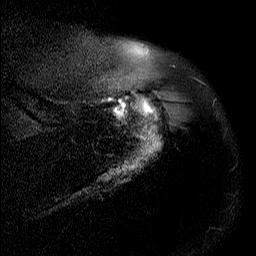
[im 19/23]
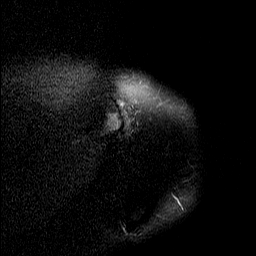
[im 23/23]
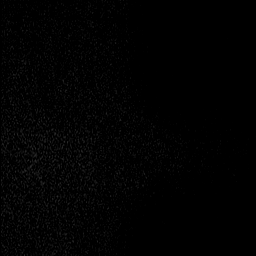

[Series 4: T2 fat-sat · oblique · 4.0mm · 0.55mm/px · 8 of 19 slices shown (2 of 3)]
[im 1/19]
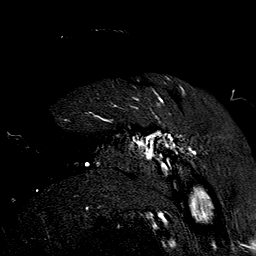
[im 3/19]
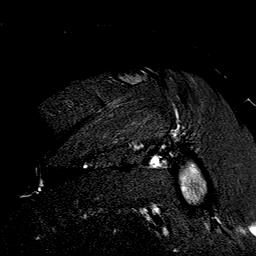
[im 6/19]
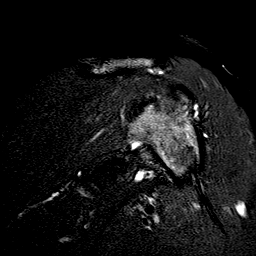
[im 8/19]
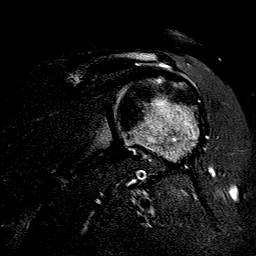
[im 11/19]
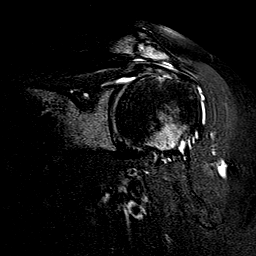
[im 13/19]
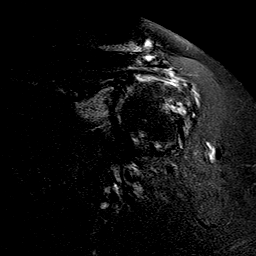
[im 16/19]
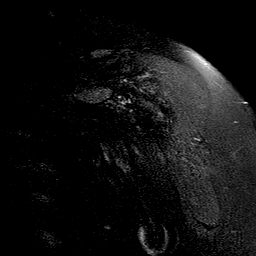
[im 19/19]
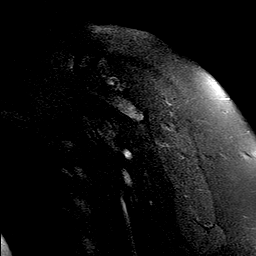

[Series 6: PD · oblique · 4.0mm · 0.55mm/px · 8 of 19 slices shown]
[im 1/19]
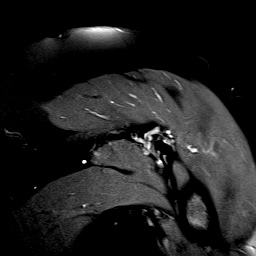
[im 3/19]
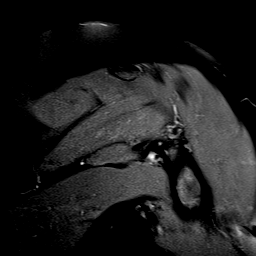
[im 6/19]
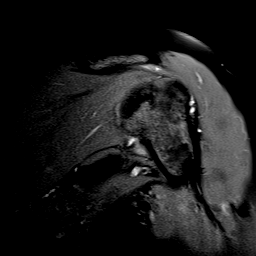
[im 8/19]
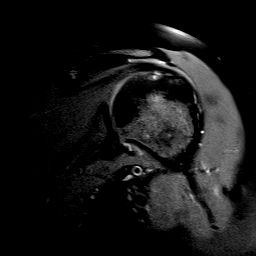
[im 11/19]
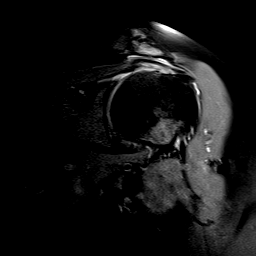
[im 13/19]
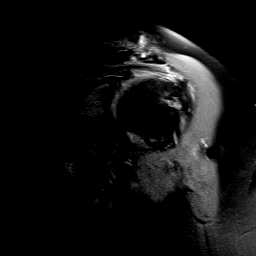
[im 16/19]
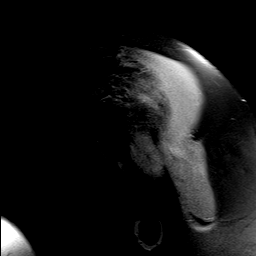
[im 19/19]
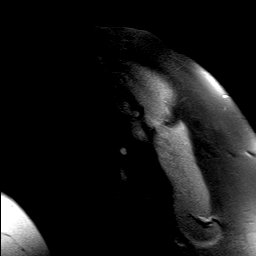

[Series 7: T1 · sagittal · 4.0mm · 0.55mm/px · 8 of 20 slices shown]
[im 1/20]
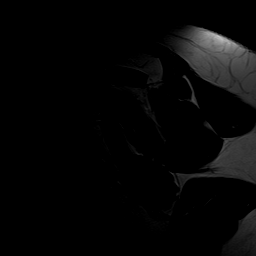
[im 3/20]
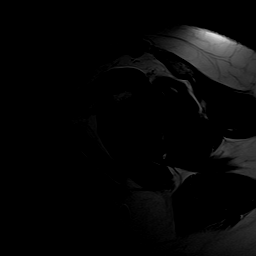
[im 6/20]
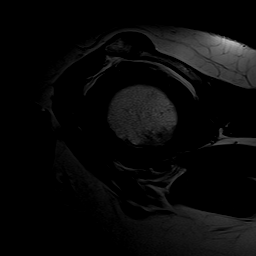
[im 9/20]
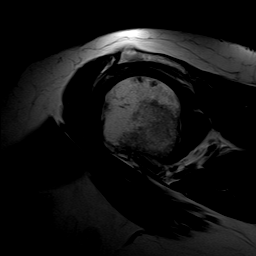
[im 11/20]
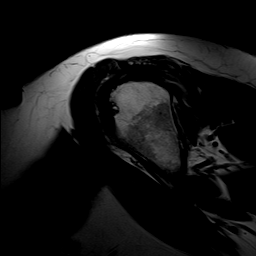
[im 14/20]
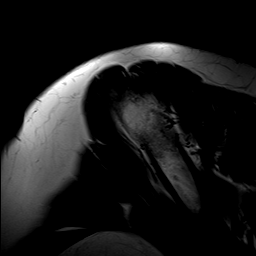
[im 17/20]
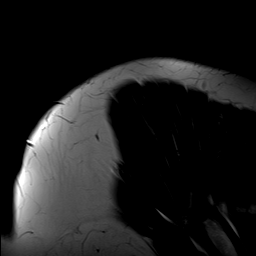
[im 20/20]
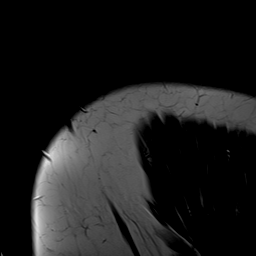

[Series 8: T2 fat-sat · sagittal · 4.0mm · 0.55mm/px · 8 of 20 slices shown (3 of 3)]
[im 1/20]
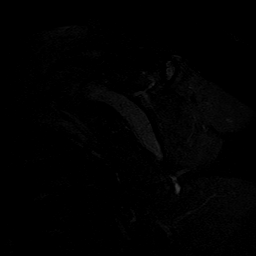
[im 3/20]
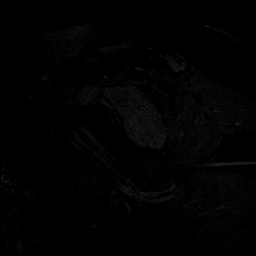
[im 6/20]
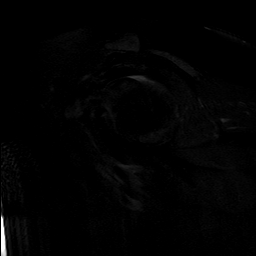
[im 9/20]
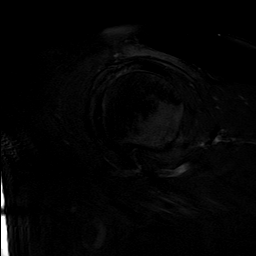
[im 11/20]
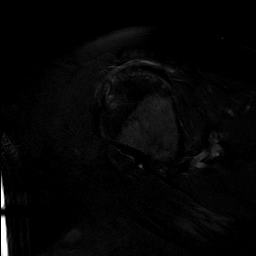
[im 14/20]
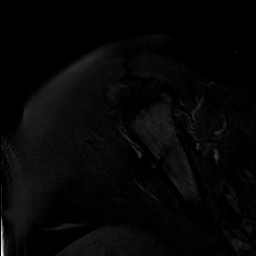
[im 17/20]
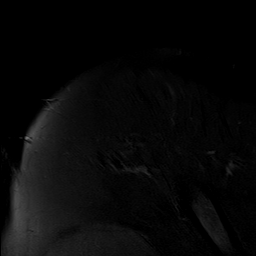
[im 20/20]
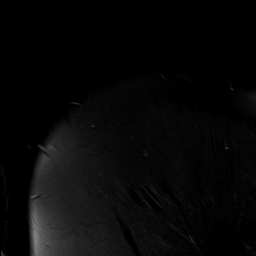

[40 of 40 positions shown; findings below may reference images not displayed]

FINDINGS: Rotator cuff: Supraspinatus worse than infraspinatus and
subscapularis tendinopathy is identified. No rotator cuff tear is
seen.

Muscles:  Intact and normal in appearance.

Biceps long head: Intact with tendinosis of the intra-articular
segment identified.

Acromioclavicular Joint:  Moderate degenerative change is seen.

Glenohumeral Joint: Unremarkable.

Labrum:  Intact.

Bones: The acromion is type 2. Fluid is present in the
subacromial/subdeltoid bursa.
IMPRESSION: Rotator cuff tendinopathy is worst in the supraspinatus but no
discrete tear is identified. Tendinosis of the long head of biceps
without tear is also identified.

Subacromial/subdeltoid bursitis.

Acromioclavicular osteoarthritis.

## 2016-05-21 ENCOUNTER — Encounter: Admission: RE | Admit: 2016-05-21 | Payer: Medicare Other | Source: Ambulatory Visit

## 2016-05-22 ENCOUNTER — Encounter
Admission: RE | Admit: 2016-05-22 | Discharge: 2016-05-22 | Disposition: A | Discharge status: Home / Self Care | Payer: Medicare Other | Source: Ambulatory Visit | Attending: Surgery | Admitting: Surgery

## 2016-05-22 DIAGNOSIS — Z01812 Encounter for preprocedural laboratory examination: Secondary | ICD-10-CM | POA: Insufficient documentation

## 2016-05-22 HISTORY — DX: Anxiety disorder, unspecified: F41.9

## 2016-05-22 HISTORY — DX: Anemia, unspecified: D64.9

## 2016-05-22 HISTORY — DX: Personal history of other infectious and parasitic diseases: Z86.19

## 2016-05-22 HISTORY — DX: Splenomegaly, not elsewhere classified: R16.1

## 2016-05-22 LAB — DIFFERENTIAL
BASOS PCT: 0 %
Basophils Absolute: 0 10*3/uL (ref 0–0.1)
EOS PCT: 1 %
Eosinophils Absolute: 0.1 10*3/uL (ref 0–0.7)
Lymphocytes Relative: 35 %
Lymphs Abs: 1.8 10*3/uL (ref 1.0–3.6)
MONO ABS: 0.5 10*3/uL (ref 0.2–0.9)
Monocytes Relative: 9 %
Neutro Abs: 2.8 10*3/uL (ref 1.4–6.5)
Neutrophils Relative %: 55 %

## 2016-05-22 LAB — BASIC METABOLIC PANEL
Anion gap: 8 (ref 5–15)
BUN: 5 mg/dL — AB (ref 6–20)
CO2: 23 mmol/L (ref 22–32)
CREATININE: 0.77 mg/dL (ref 0.44–1.00)
Calcium: 9 mg/dL (ref 8.9–10.3)
Chloride: 104 mmol/L (ref 101–111)
GFR calc Af Amer: >60 mL/min (ref >60)
Glucose, Bld: 84 mg/dL (ref 65–99)
Potassium: 3.3 mmol/L — ABNORMAL LOW (ref 3.5–5.1)
SODIUM: 135 mmol/L (ref 135–145)

## 2016-05-22 LAB — CBC
HCT: 33.9 % — ABNORMAL LOW (ref 35.0–47.0)
Hemoglobin: 10.4 g/dL — ABNORMAL LOW (ref 12.0–16.0)
MCH: 21.9 pg — AB (ref 26.0–34.0)
MCHC: 30.7 g/dL — AB (ref 32.0–36.0)
MCV: 71.3 fL — AB (ref 80.0–100.0)
PLATELETS: 141 10*3/uL — AB (ref 150–440)
RBC: 4.76 MIL/uL (ref 3.80–5.20)
RDW: 21.7 % — AB (ref 11.5–14.5)
WBC: 5.1 10*3/uL (ref 3.6–11.0)

## 2016-05-22 IMAGING — CR DG CHEST 2V
1 series · 2 of 2 positions shown · non-contrast
Comparison: [facility] chest 03/31/2014.

CLINICAL DATA: [HOSPITAL] patient with hemoptysis and productive
cough.

EXAM:
CHEST  2 VIEW

[Series 1: dxr chest pa (or ap) and lateral · 0.14mm/px · 2 of 2 slices shown]
[im 1/2]
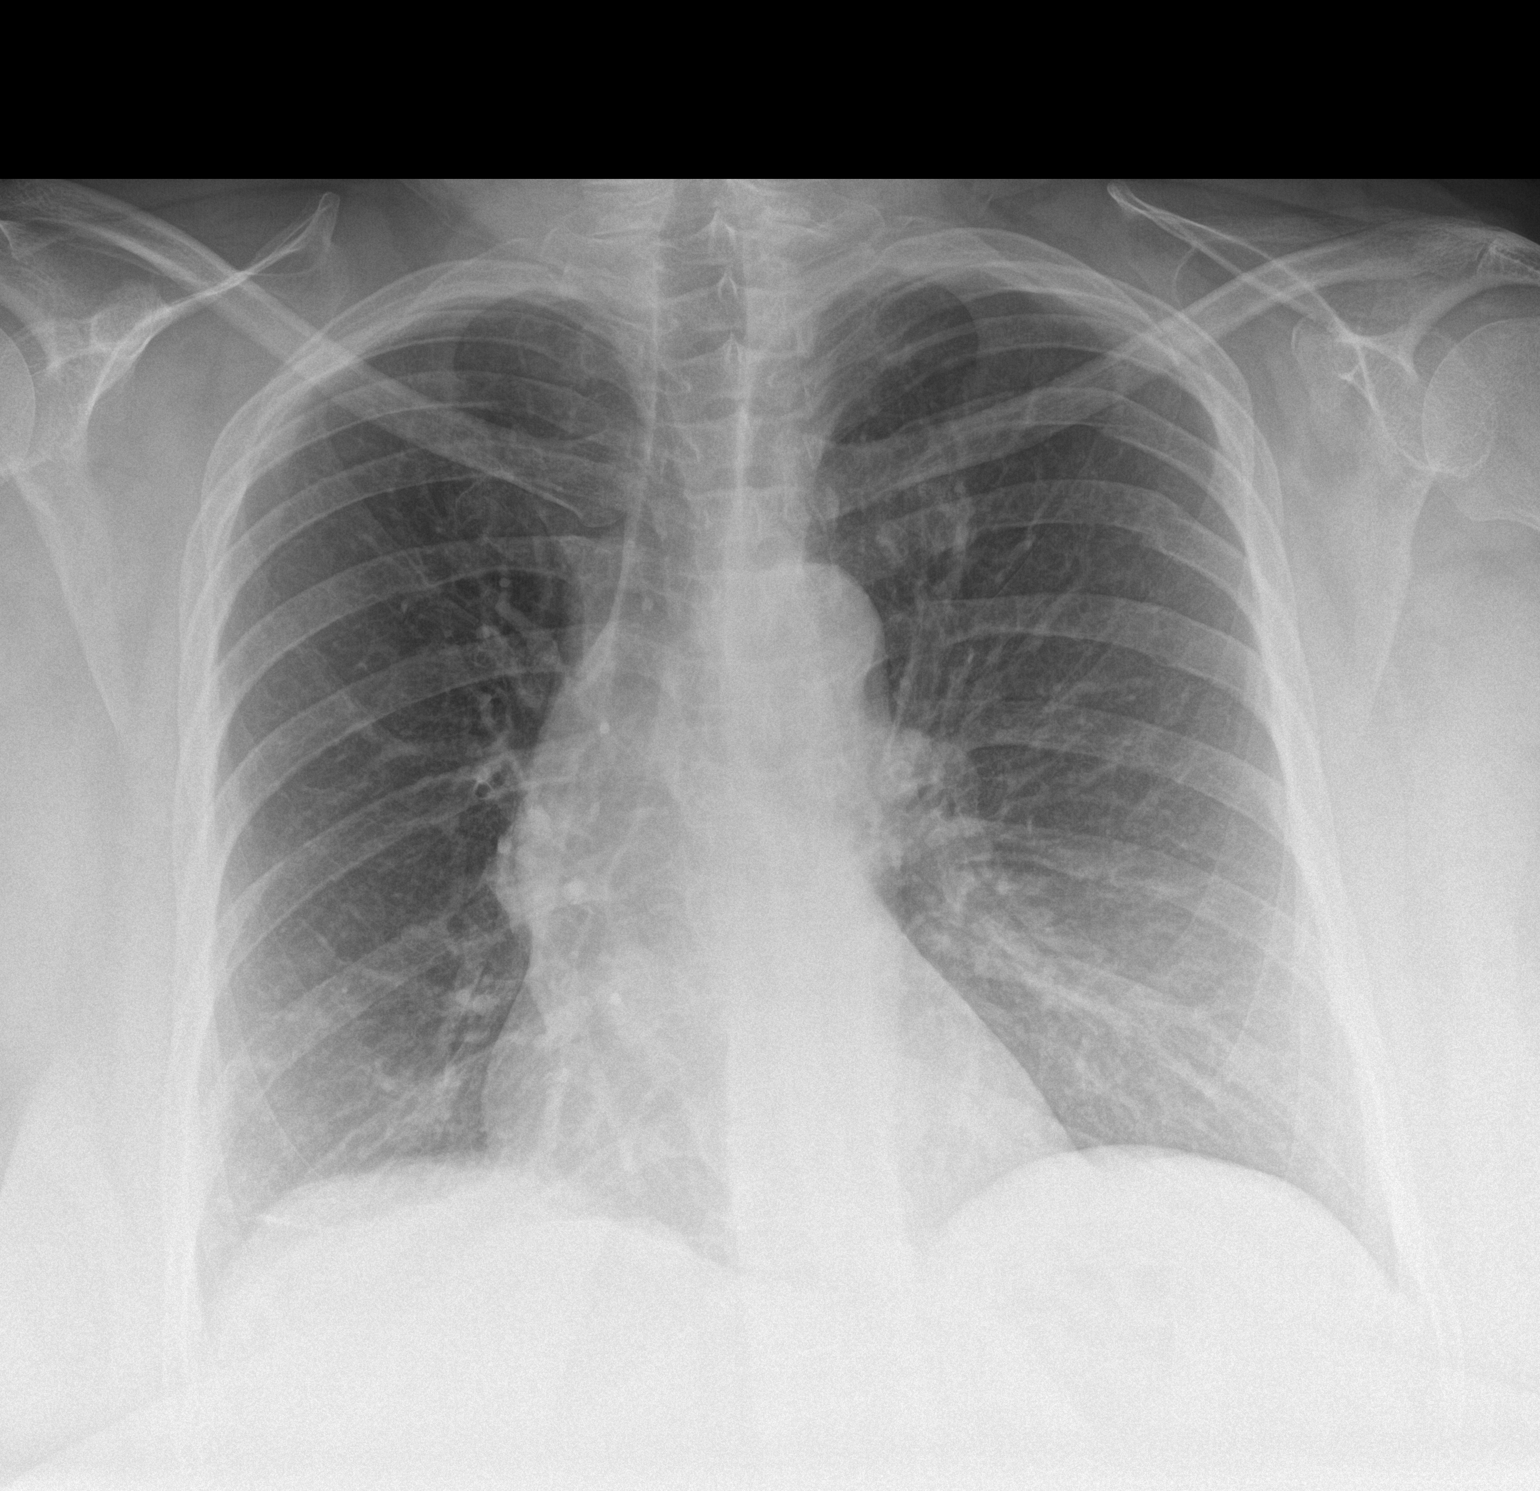
[im 2/2]
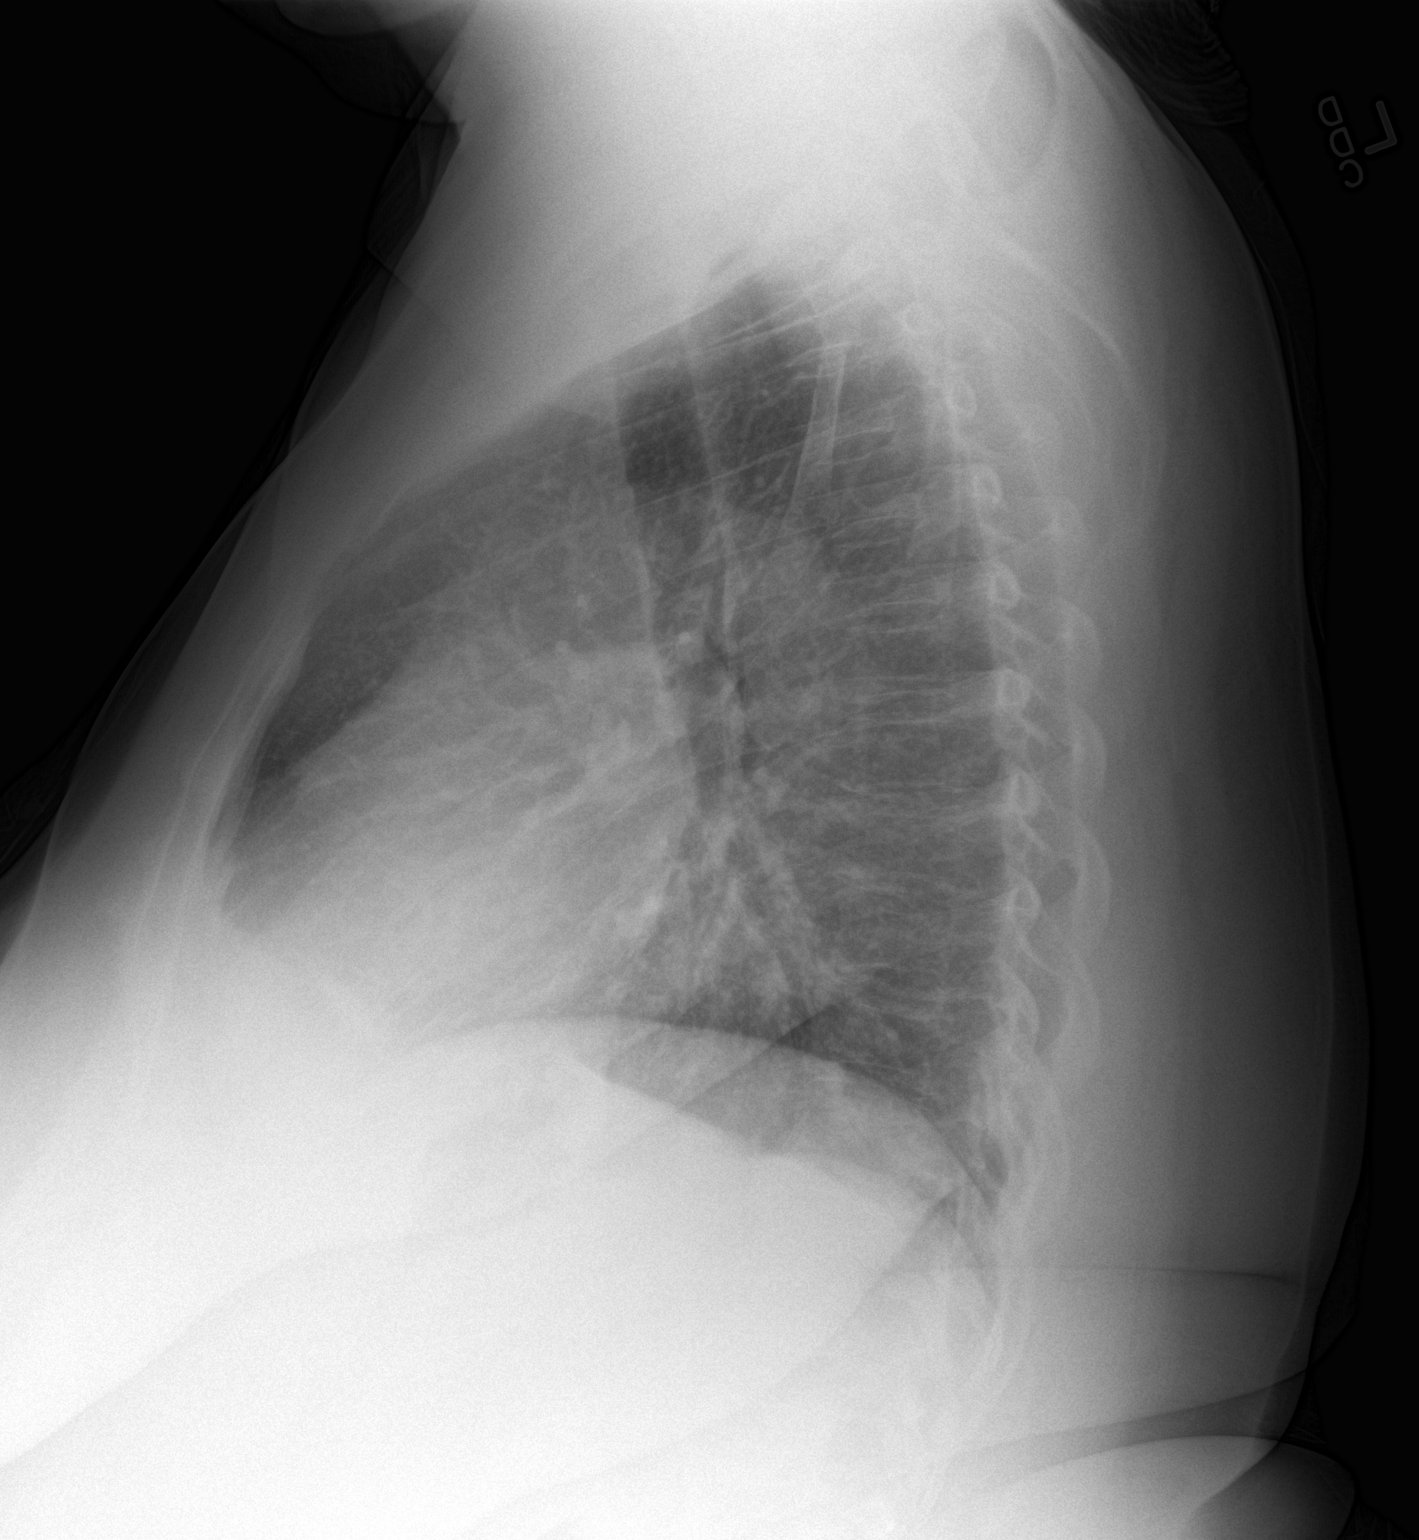

[2 of 2 positions shown; findings below may reference images not displayed]

FINDINGS: The heart size is normal. Minimal bibasilar atelectasis is present.
There is no focal airspace disease. No effusion or edema is present.
Remote posterior left sided rib fractures are again noted. The
visualized soft tissues and bony thorax are otherwise unremarkable.
IMPRESSION: 1. Mild bibasilar atelectasis.
2. No acute cardiopulmonary disease.

## 2016-05-22 NOTE — Pre-Procedure Instructions (Signed)
[name] at Dr. Roland Rack office notified of medical clearance request (called and faxed).

## 2016-05-22 NOTE — Patient Instructions (Signed)
  Your procedure is scheduled on: May 28, 2016 (Tuesday) Report to Same Day Surgery 2nd floor Medical Mall To find out your arrival time please call [phone] between 1PM - 3PM on May 27, 2016 (Monday)  Remember: Instructions that are not followed completely may result in serious medical risk, up to and including death, or upon the discretion of your surgeon and anesthesiologist your surgery may need to be rescheduled.    _x___ 1. Do not eat food or drink liquids after midnight. No gum chewing or hard candies.     __x__ 2. No Alcohol for 24 hours before or after surgery.   __x__3. No Smoking for 24 prior to surgery.   ____  4. Bring all medications with you on the day of surgery if instructed.    __x__ 5. Notify your doctor if there is any change in your medical condition     (cold, fever, infections).     Do not wear jewelry, make-up, hairpins, clips or nail polish.  Do not wear lotions, powders, or perfumes. You may wear deodorant.  Do not shave 48 hours prior to surgery. Men may shave face and neck.  Do not bring valuables to the hospital.    [facility] is not responsible for any belongings or valuables.               Contacts, dentures or bridgework may not be worn into surgery.  Leave your suitcase in the car. After surgery it may be brought to your room.  For patients admitted to the hospital, discharge time is determined by your treatment team.   Patients discharged the day of surgery will not be allowed to drive home.    Please read over the following fact sheets that you were given:   [facility] Preparing for Surgery and or MRSA Information   _x___ Take these medicines the morning of surgery with A SIP OF WATER:    1. Amlodipine  2. Gabapentin  3. Keppra  4. Dilantin  5. Omeprazole (Omeprazole at bedtime on Monday night) 6.              Pravastatin  ____Fleets enema or Magnesium Citrate as directed.   _x___ Use CHG Soap or sage wipes as directed on  instruction sheet   _x___ Use inhalers on the day of surgery and bring to hospital day of surgery (Use Albuterol nebulizer, Advair, Combivent, Dulera inhalers and Spiriva the morning of surgery, and bring all inhalers with you to the hospital the day of surgery)  ____ Stop metformin 2 days prior to surgery    ____ Take 1/2 of usual insulin dose the night before surgery and none on the morning of surgery.            _x___ Stop aspirin or coumadin, or plavix (NO ASPIRIN)  x__ Stop Anti-inflammatories such as Advil, Aleve, Ibuprofen, Motrin, Naproxen,          Naprosyn, Goodies powders or aspirin products. Ok to take Tylenol.   ____ Stop supplements until after surgery.    ____ Bring C-Pap to the hospital.

## 2016-05-22 NOTE — Pre-Procedure Instructions (Signed)
Dr. Randa Lynn notified of patient medical history and medical clearance requested.

## 2016-05-22 NOTE — Pre-Procedure Instructions (Signed)
Dr.Pendwarden okay EKG from January 17, 2016.

## 2016-05-23 LAB — SURGICAL PCR SCREEN
MRSA, PCR: POSITIVE — AB
STAPHYLOCOCCUS AUREUS: POSITIVE — AB

## 2016-05-23 NOTE — Pre-Procedure Instructions (Addendum)
Lab results from yesterday's PAT visit faxed to Dr. Roland Rack, Potassium of 3.3 and MRSA positive. Request for potassium supplement.

## 2016-05-24 NOTE — Pre-Procedure Instructions (Signed)
Spoke with [name] at Dr. Nicholaus Bloom office regarding Medical clearance request from 05/22/16.  She will check on it and fax it to [place].

## 2016-05-28 ENCOUNTER — Ambulatory Visit: Admission: RE | Admit: 2016-05-28 | Payer: Medicare Other | Source: Ambulatory Visit | Admitting: Surgery

## 2016-05-28 ENCOUNTER — Encounter: Admission: RE | Payer: Self-pay | Source: Ambulatory Visit

## 2016-05-28 SURGERY — ARTHROSCOPY, SHOULDER WITH REPAIR, ROTATOR CUFF, OPEN
Anesthesia: Choice | Laterality: Left

## 2016-05-31 IMAGING — CR DG CHEST 1V
1 series · 1 of 1 positions shown · non-contrast
Comparison: June 12, 2014.

CLINICAL DATA: Cough.

EXAM:
CHEST - 1 VIEW

[ap]
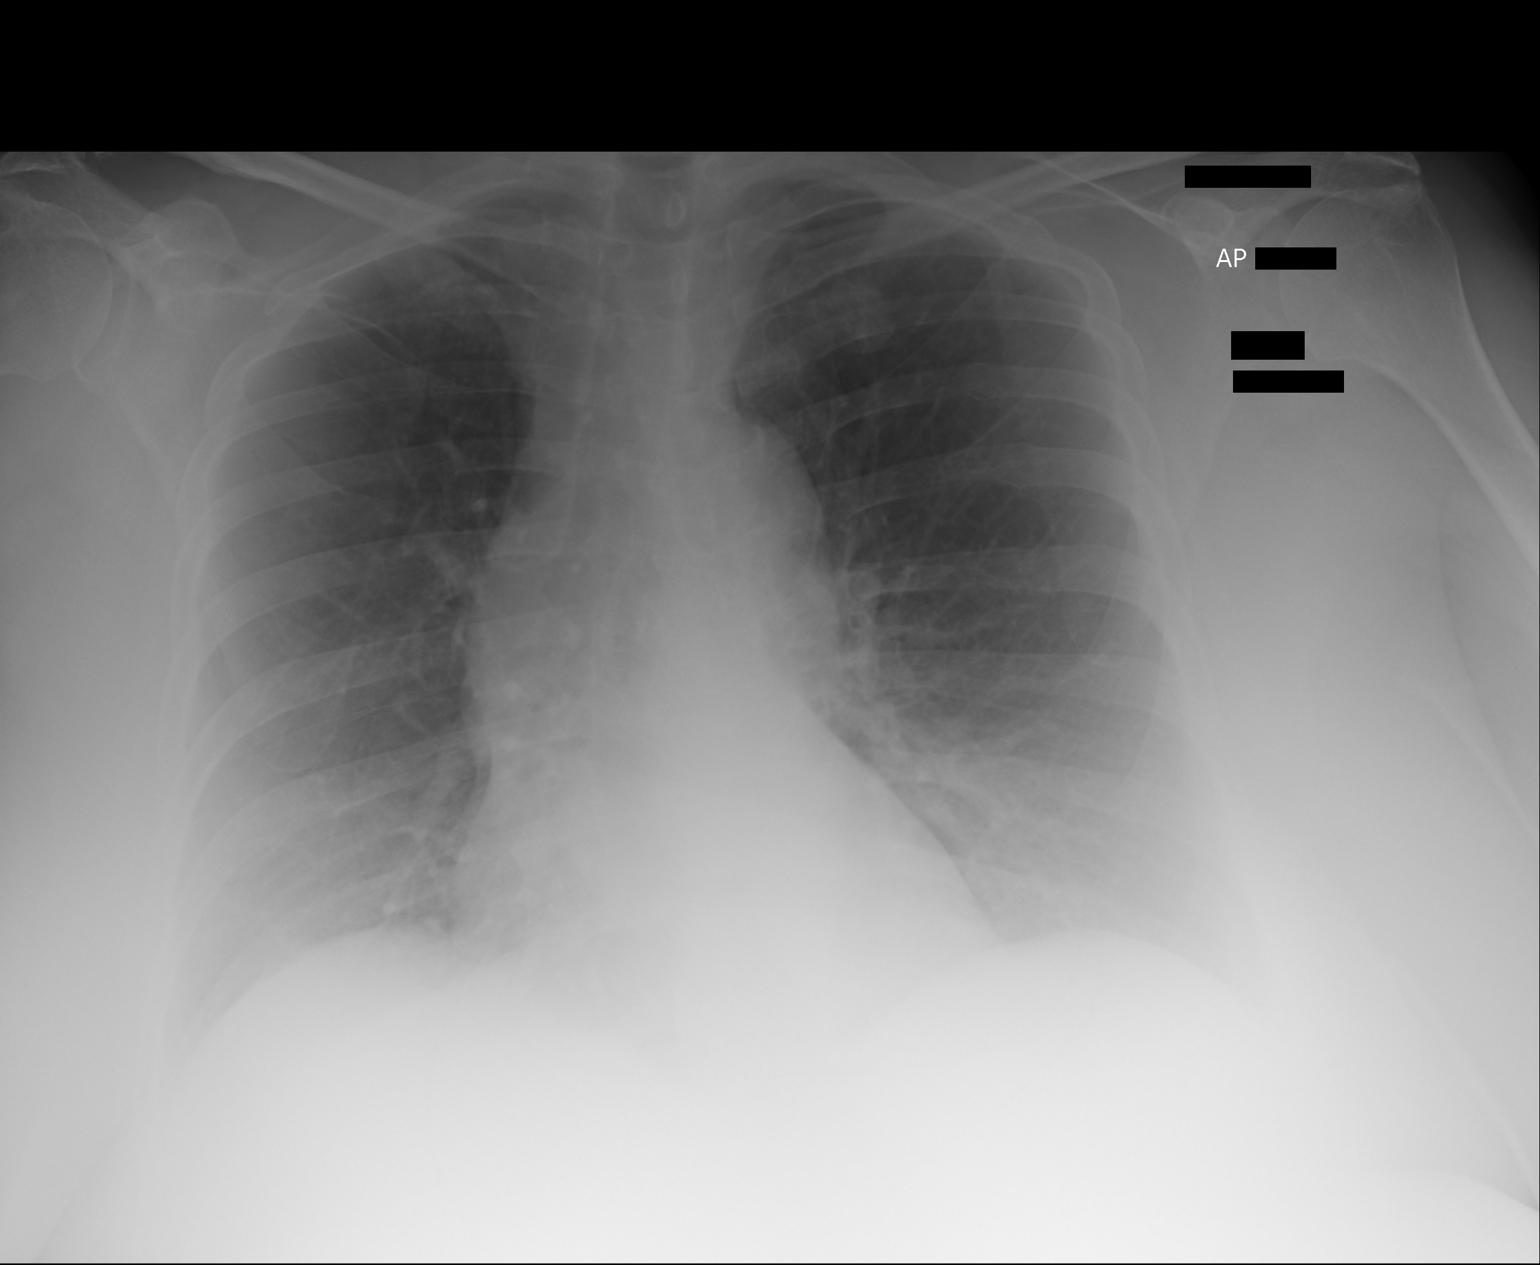

[1 of 1 positions shown; findings below may reference images not displayed]

FINDINGS: The heart size and mediastinal contours are within normal limits.
Both lungs are clear. No pneumothorax or pleural effusion is noted.
The visualized skeletal structures are unremarkable.
IMPRESSION: No acute cardiopulmonary abnormality seen.

## 2016-06-02 ENCOUNTER — Encounter: Payer: Self-pay | Admitting: Emergency Medicine

## 2016-06-02 ENCOUNTER — Emergency Department
Admission: EM | Admit: 2016-06-02 | Discharge: 2016-06-02 | Disposition: A | Discharge status: Home / Self Care | Payer: Medicare Other | Attending: Emergency Medicine | Admitting: Emergency Medicine

## 2016-06-02 DIAGNOSIS — K852 Alcohol induced acute pancreatitis without necrosis or infection: Secondary | ICD-10-CM | POA: Diagnosis not present

## 2016-06-02 DIAGNOSIS — E1122 Type 2 diabetes mellitus with diabetic chronic kidney disease: Secondary | ICD-10-CM | POA: Insufficient documentation

## 2016-06-02 DIAGNOSIS — K759 Inflammatory liver disease, unspecified: Secondary | ICD-10-CM | POA: Diagnosis not present

## 2016-06-02 DIAGNOSIS — F418 Other specified anxiety disorders: Secondary | ICD-10-CM | POA: Insufficient documentation

## 2016-06-02 DIAGNOSIS — I509 Heart failure, unspecified: Secondary | ICD-10-CM | POA: Insufficient documentation

## 2016-06-02 DIAGNOSIS — F17210 Nicotine dependence, cigarettes, uncomplicated: Secondary | ICD-10-CM | POA: Insufficient documentation

## 2016-06-02 DIAGNOSIS — N189 Chronic kidney disease, unspecified: Secondary | ICD-10-CM | POA: Insufficient documentation

## 2016-06-02 DIAGNOSIS — R0981 Nasal congestion: Secondary | ICD-10-CM | POA: Diagnosis present

## 2016-06-02 DIAGNOSIS — J449 Chronic obstructive pulmonary disease, unspecified: Secondary | ICD-10-CM | POA: Diagnosis not present

## 2016-06-02 DIAGNOSIS — I130 Hypertensive heart and chronic kidney disease with heart failure and stage 1 through stage 4 chronic kidney disease, or unspecified chronic kidney disease: Secondary | ICD-10-CM | POA: Diagnosis not present

## 2016-06-02 DIAGNOSIS — B349 Viral infection, unspecified: Secondary | ICD-10-CM

## 2016-06-02 DIAGNOSIS — Z79899 Other long term (current) drug therapy: Secondary | ICD-10-CM | POA: Insufficient documentation

## 2016-06-02 DIAGNOSIS — J069 Acute upper respiratory infection, unspecified: Secondary | ICD-10-CM | POA: Diagnosis not present

## 2016-06-02 MED ORDER — TRAMADOL HCL 50 MG PO TABS: 50.0000 mg | ORAL_TABLET | Freq: Four times a day (QID) | ORAL | 0 refills | Status: DC | PRN

## 2016-06-02 MED ORDER — IPRATROPIUM-ALBUTEROL 0.5-2.5 (3) MG/3ML IN SOLN
3.0000 mL | Freq: Once | RESPIRATORY_TRACT | Status: AC
  Administered 2016-06-02: 3 mL via RESPIRATORY_TRACT
  Filled 2016-06-02: qty 3

## 2016-06-02 MED ORDER — KETOROLAC TROMETHAMINE 60 MG/2ML IM SOLN
60.0000 mg | Freq: Once | INTRAMUSCULAR | Status: AC
  Administered 2016-06-02: 60 mg via INTRAMUSCULAR
  Filled 2016-06-02: qty 2

## 2016-06-02 MED ORDER — PSEUDOEPH-BROMPHEN-DM 30-2-10 MG/5ML PO SYRP: 5.0000 mL | ORAL_SOLUTION | Freq: Four times a day (QID) | ORAL | 0 refills | Status: DC | PRN

## 2016-06-02 MED ORDER — DIPHENHYDRAMINE HCL 25 MG PO CAPS
25.0000 mg | ORAL_CAPSULE | Freq: Once | ORAL | Status: AC
  Administered 2016-06-02: 25 mg via ORAL
  Filled 2016-06-02: qty 1

## 2016-06-02 MED ORDER — CYPROHEPTADINE HCL 4 MG PO TABS: 4.0000 mg | ORAL_TABLET | Freq: Three times a day (TID) | ORAL | 0 refills | Status: DC | PRN

## 2016-06-02 MED ORDER — DIPHENHYDRAMINE HCL 25 MG PO CAPS
ORAL_CAPSULE | ORAL | Status: AC
  Filled 2016-06-02: qty 1

## 2016-06-02 MED ORDER — HYDROCOD POLST-CPM POLST ER 10-8 MG/5ML PO SUER
5.0000 mL | Freq: Once | ORAL | Status: AC
  Administered 2016-06-02: 5 mL via ORAL
  Filled 2016-06-02: qty 5

## 2016-06-02 NOTE — ED Notes (Signed)
Pt states that she has been having a cold x1 week.

## 2016-06-02 NOTE — ED Triage Notes (Signed)
Generalized body aches, nasal congestion cough

## 2016-06-02 NOTE — ED Provider Notes (Signed)
[facility] Emergency Department Provider Note   ____________________________________________   None    (approximate)  I have reviewed the triage vital signs and the nursing notes.   HISTORY  Chief Complaint Generalized Body Aches    HPI [name] is a 60 y.o. female patient complaining of generalized body ache, nasal congestion and cough. Patient also states she's itching. Patient state is a sensation secondary to her diagnosis of liver cirrhosis. Patient denies nausea vomiting diarrhea. She denies any fever with this complaint. Patient states she has some wheezing with her cough. No palliative measures for her complaint. She rates the pain as a 7/10. Patient described a pain as "achy".   Past Medical History:  Diagnosis Date  . Allergy   . Anemia   . Anxiety   . CHF (congestive heart failure) ([place])   . Chronic bronchitis ([place])   . Chronic kidney disease   . Cirrhosis ([place])   . Depression   . Diabetes mellitus without complication ([place])   . Diverticulitis   . Emphysema of lung ([place])   . GERD (gastroesophageal reflux disease)   . H/O drug abuse    Clean since 2009  . H/O ETOH abuse    Sober since 2009  . Hepatitis C   . History of sepsis 2016  . Hypertension   . Malignant brain tumor ([place]) 2007  . Migraines   . Pancreatic ascites   . Pancreatitis, alcoholic 7681  . Rectovaginal fistula   . Seizures [facility])    Last seizure October 2017  . Spleen enlarged   . Stroke [facility]) 2007, 2008   during brain surgery  . Urine incontinence     Patient Active Problem List   Diagnosis Date Noted  . 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 Infection 02/26/2016  . Acute intractable headache 02/24/2016  . Acute headache 02/24/2016  . Osteomyelitis of skull ([place])   . Encephalopathy   . Status post craniectomy 12/28/2015  . Wound infection after surgery 12/26/2015  . UTI (lower urinary tract infection) 10/22/2015  . Chest pain on breathing   . SOB  (shortness of breath)   . Wheezing   . Subdural hematoma ([place]) 08/16/2015  . GI bleed 06/24/2015  . Pyelonephritis 06/24/2015  . Sepsis ([place]) 06/22/2015  . Orthostatic hypotension 05/15/2015  . Alcohol-induced acute pancreatitis 03/13/2015  . Abdominal pain 02/08/2015  . Chronic pain syndrome 02/08/2015  . Closed fracture of coccyx ([place]) 02/02/2015  . COPD (chronic obstructive pulmonary disease) ([place]) 02/02/2015  . Primary insomnia 02/02/2015  . Gastro-esophageal reflux disease with esophagitis 02/02/2015  . Stress incontinence in female 02/02/2015  . ALC (alcoholic liver cirrhosis) ([place]) 12/23/2014  . Clinical depression 12/23/2014  . Anxiety 12/23/2014  . Diabetes mellitus, type 2 ([place]) 12/23/2014  . HTN (hypertension) 11/05/2014  . Liver cirrhosis ([place]) 07/19/2014  . Liver failure ([place]) 02/18/2013  . 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 Chronic pancreatitis ([place]) 11/03/2012  . Seizure disorder ([place]) 11/03/2012  . Benign meningioma of brain ([place]) 11/03/2012  . Chronic pelvic pain in female 11/03/2012  . Hepatitis C virus infection without hepatic coma 11/03/2012  . UTI (urinary tract infection) 11/03/2012  . Alcohol abuse, unspecified 11/03/2012  . Chronic liver disease 11/03/2012  . Schizophrenia ([place]) 11/03/2012  . Narcotic abuse 11/03/2012  . Benign neoplasm of cerebral meninges ([place]) 11/03/2012  . Breast screening 11/03/2012  . 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 Nondependent alcohol abuse 11/03/2012  . 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 Female genital symptoms 11/03/2012    Past Surgical History:  Procedure Laterality Date  . ABDOMINAL HYSTERECTOMY  1979  . APPENDECTOMY  1999  . BRAIN SURGERY  2007   malignant brain tumor  . CHOLECYSTECTOMY  2001  . COLON SURGERY  2006  . CRANIOPLASTY N/A 09/15/2015   Procedure: Cranioplasty with retrieval of bone flap from abdominal pocket;  Surgeon: Ashok Pall, MD;   Location: [facility];  Service: Neurosurgery;  Laterality: N/A;  Cranioplasty with retrieval of bone flap from abdominal pocket  . CRANIOTOMY Right 08/16/2015   Procedure: Right Fronto-Temporal-Parietal Craniotomy for Evacuation of Hematoma with Bone Flap Placement in Abdomen;  Surgeon: Ashok Pall, MD;  Location: [facility]S;  Service: Neurosurgery;  Laterality: Right;  . CRANIOTOMY N/A 12/28/2015   Procedure: Craniectomy for wound debridement;  Surgeon: Ashok Pall, MD;  Location: [facility]S;  Service: Neurosurgery;  Laterality: N/A;  Craniectomy for wound debridement  . DILATION AND CURETTAGE OF UTERUS    . EYE SURGERY    . JOINT REPLACEMENT Left    TOTAL KNEE REPLACEMENT  . rectovaginal fistula repair w/ colostomy  2011  . REPLACEMENT TOTAL KNEE Left 2005    Prior to Admission medications   Medication Sig Start Date End Date Taking? Authorizing Provider  albuterol (PROVENTIL HFA;VENTOLIN HFA) 108 (90 Base) MCG/ACT inhaler Inhale 2 puffs into the lungs every 6 (six) hours as needed for wheezing or shortness of breath.    Historical Provider, MD  albuterol (PROVENTIL) (5 MG/ML) 0.5% nebulizer solution Take 2.5 mg by nebulization every 6 (six) hours as needed for wheezing or shortness of breath.     Historical Provider, MD  ALPRAZolam Duanne Moron) 1 MG tablet Take 1 tablet (1 mg total) by mouth 2 (two) times daily as needed for anxiety. Patient taking differently: Take 1 mg by mouth 3 (three) times daily.  03/14/16   Rubie Maid, MD  amitriptyline (ELAVIL) 50 MG tablet Take 50 mg by mouth at bedtime.    Historical Provider, MD  amLODipine (NORVASC) 10 MG tablet Take 10 mg by mouth daily.    Historical Provider, MD  Blood Glucose Monitoring Suppl (FIFTY50 GLUCOSE METER 2.0) w/Device KIT ICD-10 E11.9 Check blood sugars once daily 09/04/15   Historical Provider, MD  brompheniramine-pseudoephedrine-DM 30-2-10 MG/5ML syrup Take 5 mLs by mouth 4 (four) times daily as needed. 06/02/16   Sable Feil,  PA-C  cyproheptadine (PERIACTIN) 4 MG tablet Take 1 tablet (4 mg total) by mouth 3 (three) times daily as needed for allergies. 06/02/16   Sable Feil, PA-C  diphenhydrAMINE (BENADRYL) 25 mg capsule Take 1 capsule (25 mg total) by mouth every 8 (eight) hours as needed for itching. 10/25/15   Vaughan Basta, MD  Fluticasone-Salmeterol (ADVAIR) 250-50 MCG/DOSE AEPB Inhale 1 puff into the lungs 2 (two) times daily.     Historical Provider, MD  furosemide (LASIX) 20 MG tablet Take 20 mg by mouth every morning.     Historical Provider, MD  gabapentin (NEURONTIN) 300 MG capsule Take 600 mg by mouth 4 (four) times daily.  12/11/15   Historical Provider, MD  hydrOXYzine (ATARAX/VISTARIL) 25 MG tablet Take 1 tablet by mouth  every 6 hours as needed 03/08/16   Historical Provider, MD  Ipratropium-Albuterol (COMBIVENT) 20-100 MCG/ACT AERS respimat Inhale 2 puffs into the lungs every 6 (six) hours as needed for wheezing or shortness of breath.     Historical Provider, MD  levETIRAcetam (KEPPRA) 500 MG  tablet Take 500 mg by mouth 2 (two) times daily.  10/18/15   Historical Provider, MD  lipase/protease/amylase (CREON) 12000 units CPEP capsule Take 24,000 Units by mouth 3 (three) times daily with meals.    Historical Provider, MD  mometasone-formoterol (DULERA) 200-5 MCG/ACT AERO Inhale 2 puffs into the lungs 2 (two) times daily.    Historical Provider, MD  omeprazole (PRILOSEC) 20 MG capsule Take 20 mg by mouth every morning.  11/29/15   Historical Provider, MD  Oxycodone HCl 20 MG TABS Take 20 mg by mouth 3 (three) times daily as needed (pain).    Historical Provider, MD  phenytoin (DILANTIN) 100 MG/4ML suspension Take 300 mg by mouth 2 (two) times daily. Takes 68m by mouth bid    Historical Provider, MD  pravastatin (PRAVACHOL) 10 MG tablet Take 10 mg by mouth daily.    Historical Provider, MD  prochlorperazine (COMPAZINE) 10 MG tablet Take 10 mg by mouth every 12 (twelve) hours as needed for nausea or  vomiting.    Historical Provider, MD  promethazine (PHENERGAN) 25 MG tablet Take 25 mg by mouth every 8 (eight) hours as needed for nausea or vomiting.    Historical Provider, MD  sertraline (ZOLOFT) 100 MG tablet Take 200 mg by mouth daily.    Historical Provider, MD  spironolactone (ALDACTONE) 50 MG tablet Take 1 tablet (50 mg total) by mouth 2 (two) times daily. 10/25/15   VVaughan Basta MD  sucralfate (CARAFATE) 1 g tablet Take 1 g by mouth 4 (four) times daily.    Historical Provider, MD  tiotropium (SPIRIVA) 18 MCG inhalation capsule Place 18 mcg into inhaler and inhale daily as needed (shortness of breath).     Historical Provider, MD  traMADol (ULTRAM) 50 MG tablet take 1 tablet by mouth once daily for severe pain 02/14/16   Historical Provider, MD  traMADol (ULTRAM) 50 MG tablet Take 1 tablet (50 mg total) by mouth every 6 (six) hours as needed for moderate pain. 06/02/16   RSable Feil PA-C  triamcinolone (KENALOG) 0.025 % ointment Apply 1 application topically 2 (two) times daily. 03/17/16   ARubie Maid MD  triamcinolone cream (KENALOG) 0.1 % apply to affected area twice a day 02/07/16   Historical Provider, MD  zolpidem (AMBIEN) 5 MG tablet Take 1 tablet (5 mg total) by mouth at bedtime as needed for sleep. 02/28/16   SBettey Costa MD    Allergies Ibuprofen; Sulfa antibiotics; Hydroxyzine; Amoxicillin; Chocolate; Chocolate flavor; Ciprofloxacin; Dilaudid [hydromorphone hcl]; Fentanyl; Hydromorphone; Hydromorphone hcl; Metformin; Penicillins; Strawberry extract; Zofran [ondansetron hcl]; and Zofran [ondansetron]  Family History  Problem Relation Age of Onset  . Hypertension Mother   . Heart disease Father   . Diabetes Father   . Cancer Sister     brain  . Cancer Grandchild 8    brain tumor    Social History Social History  Substance Use Topics  . Smoking status: Current Every Day Smoker    Packs/day: 1.00    Types: Cigarettes  . Smokeless tobacco: Never Used  .  Alcohol use 2.4 oz/week    4 Cans of beer per week    Review of Systems Constitutional: No fever/chills Eyes: No visual changes. ENT: No sore throat. Cardiovascular: Denies chest pain. Respiratory: Denies shortness of breath. Gastrointestinal: No abdominal pain.  No nausea, no vomiting.  No diarrhea.  No constipation. Genitourinary: Negative for dysuria. Musculoskeletal: Negative for back pain. Skin: Negative for rash. Neurological: Negative for headaches, focal weakness or numbness.  Psychiatric:Anxiety and depression Endocrine:Diabetes, alcoholic pancreatitis, hepatitis and hypertension Allergic/Immunilogical: See medication list   ____________________________________________   PHYSICAL EXAM:  VITAL SIGNS: ED Triage Vitals  Enc Vitals Group     BP 06/02/16 2203 121/74     Pulse Rate 06/02/16 2203 (!) 104     Resp 06/02/16 2203 14     Temp 06/02/16 2203 98.8 F (37.1 C)     Temp Source 06/02/16 2203 Oral     SpO2 06/02/16 2203 96 %     Weight 06/02/16 2203 246 lb (111.6 kg)     Height 06/02/16 2203 '5\' 8"'$  (1.727 m)     Head Circumference --      Peak Flow --      Pain Score 06/02/16 2206 7     Pain Loc --      Pain Edu? --      Excl. in [place]? --     Constitutional: Alert and oriented. Well appearing and in no acute distress.Overt obesity Eyes: Conjunctivae are normal. PERRL. EOMI. Head: Atraumatic. Nose: Edematous nasal turbinates  Mouth/Throat: Mucous membranes are moist.  Oropharynx non-erythematous. Postnasal drainage Neck: No stridor.  No cervical spine tenderness to palpation. Hematological/Lymphatic/Immunilogical: No cervical lymphadenopathy. Cardiovascular: Normal rate, regular rhythm. Grossly normal heart sounds.  Good peripheral circulation. Respiratory: Normal respiratory effort.  No retractions. Lungs mild wheezing and cough . Gastrointestinal: Soft and nontender. No distention. No abdominal bruits. No CVA tenderness. Musculoskeletal: No lower  extremity tenderness nor edema.  No joint effusions. Neurologic:  Normal speech and language. No gross focal neurologic deficits are appreciated. No gait instability. Skin:  Skin is warm, dry and intact. No rash noted. Psychiatric: Mood and affect are normal. Speech and behavior are normal.  ____________________________________________   LABS (all labs ordered are listed, but only abnormal results are displayed)  Labs Reviewed - No data to display ____________________________________________  EKG   ____________________________________________  RADIOLOGY   ____________________________________________   PROCEDURES  Procedure(s) performed: None  Procedures  Critical Care performed: No  ____________________________________________   INITIAL IMPRESSION / ASSESSMENT AND PLAN / ED COURSE  Pertinent labs & imaging results that were available during my care of the patient were reviewed by me and considered in my medical decision making (see chart for details). Viral upper respiratory infection. Patient given discharge care instructions. Patient given a prescription for Bromfed-DM, tramadol, and Periactin. Patient advised follow-up family doctor condition persists.  Clinical Course      ____________________________________________   FINAL CLINICAL IMPRESSION(S) / ED DIAGNOSES  Final diagnoses:  Viral syndrome      NEW MEDICATIONS STARTED DURING THIS VISIT:  New Prescriptions   BROMPHENIRAMINE-PSEUDOEPHEDRINE-DM 30-2-10 MG/5ML SYRUP    Take 5 mLs by mouth 4 (four) times daily as needed.   CYPROHEPTADINE (PERIACTIN) 4 MG TABLET    Take 1 tablet (4 mg total) by mouth 3 (three) times daily as needed for allergies.   TRAMADOL (ULTRAM) 50 MG TABLET    Take 1 tablet (50 mg total) by mouth every 6 (six) hours as needed for moderate pain.     Note:  This document was prepared using Dragon voice recognition software and may include unintentional dictation errors.      Sable Feil, PA-C 06/02/16 2314    Lavonia Drafts, MD 06/03/16 1027

## 2016-06-02 NOTE — ED Notes (Signed)
Pt requesting medication for itching "from my cirrhosis of the liver". Advised to ask doctor when seen.

## 2016-06-24 NOTE — Telephone Encounter (Signed)
error 

## 2016-07-06 IMAGING — CR DG BARIUM ENEMA
1 series · 8 of 8 positions shown · non-contrast
Comparison: CT 03/19/2014.

CLINICAL DATA: Fistula.

EXAM:
WATER SOLUBLE CONTRAST ENEMA
TECHNIQUE: Standard Gastrografin enema performed. Fluoroscopic evaluation and
spot imaging obtained.
FLUOROSCOPY TIME:  3 min 12 seconds.

[Series 1: scout · 0.17mm/px · 8 of 8 slices shown]
[im 1/8]
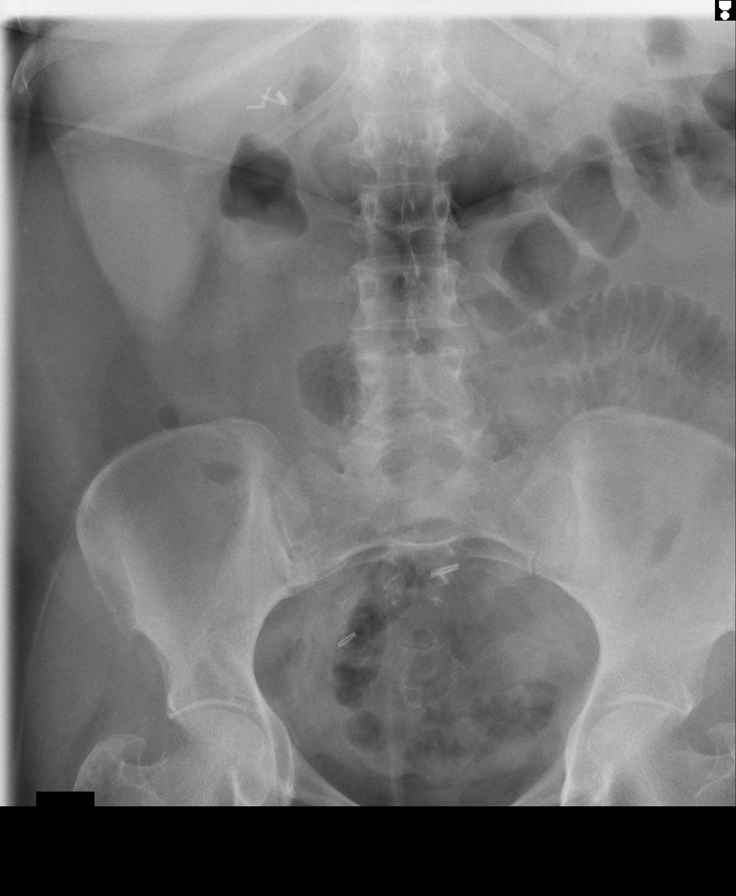
[im 2/8]
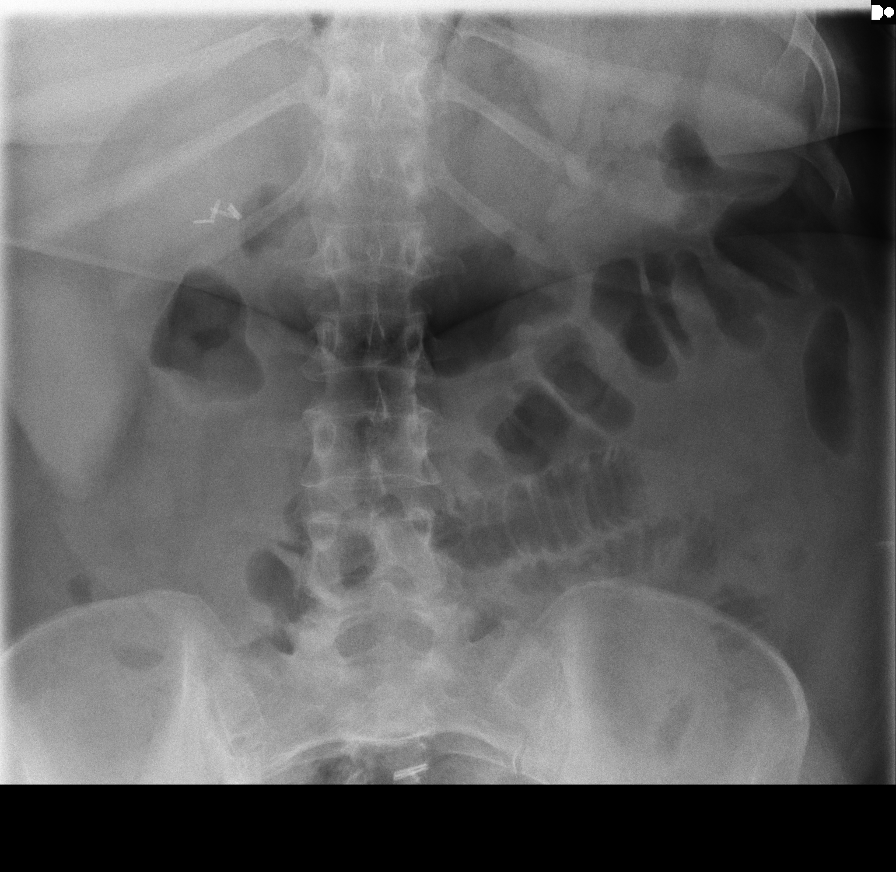
[im 3/8]
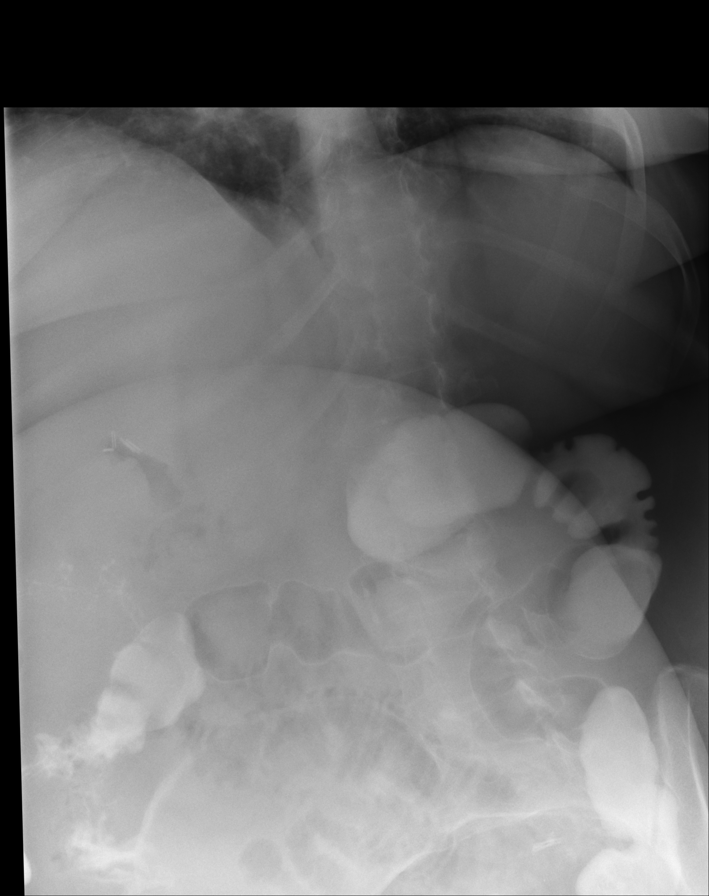
[im 4/8]
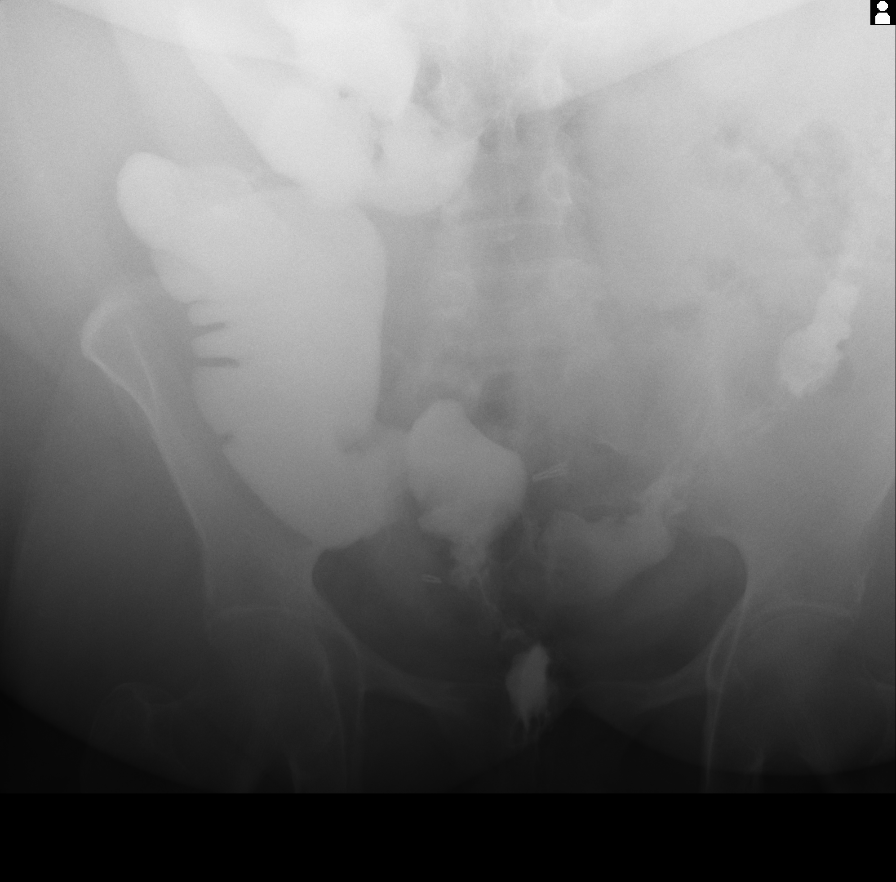
[im 5/8]
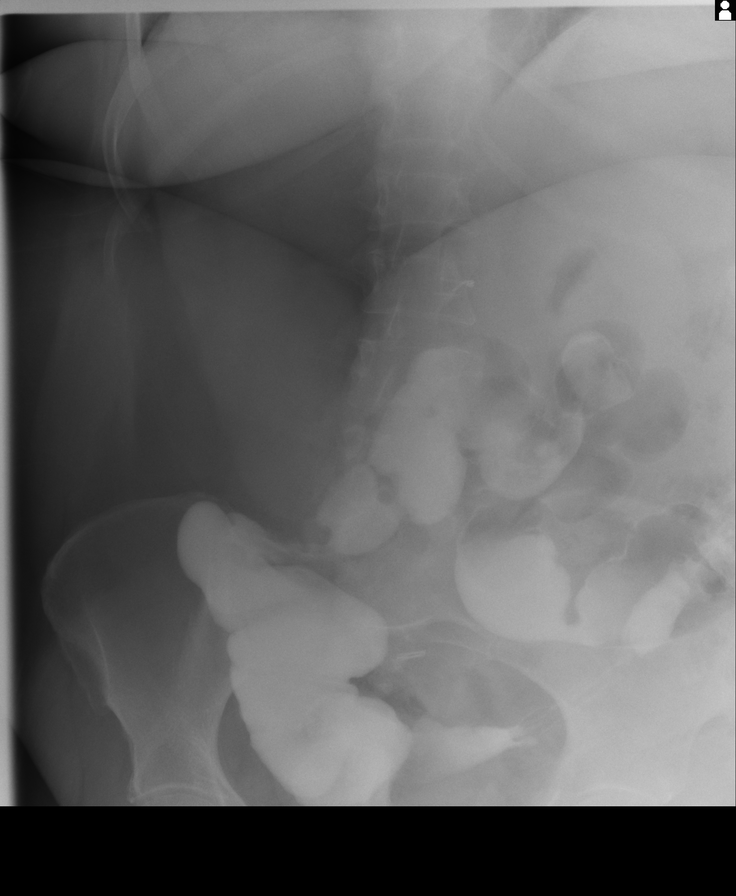
[im 6/8]
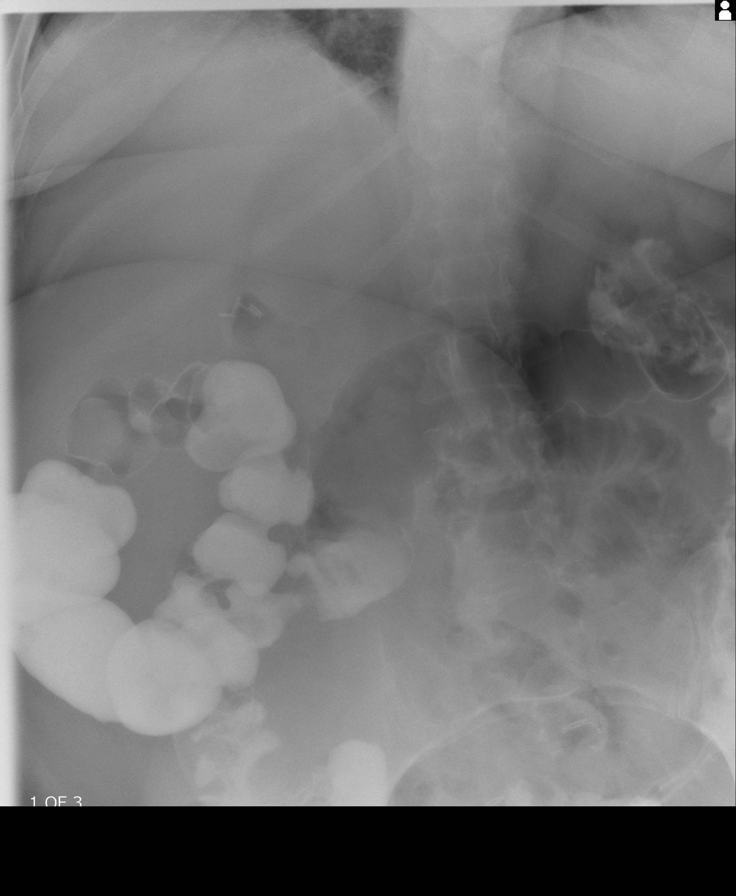
[im 7/8]
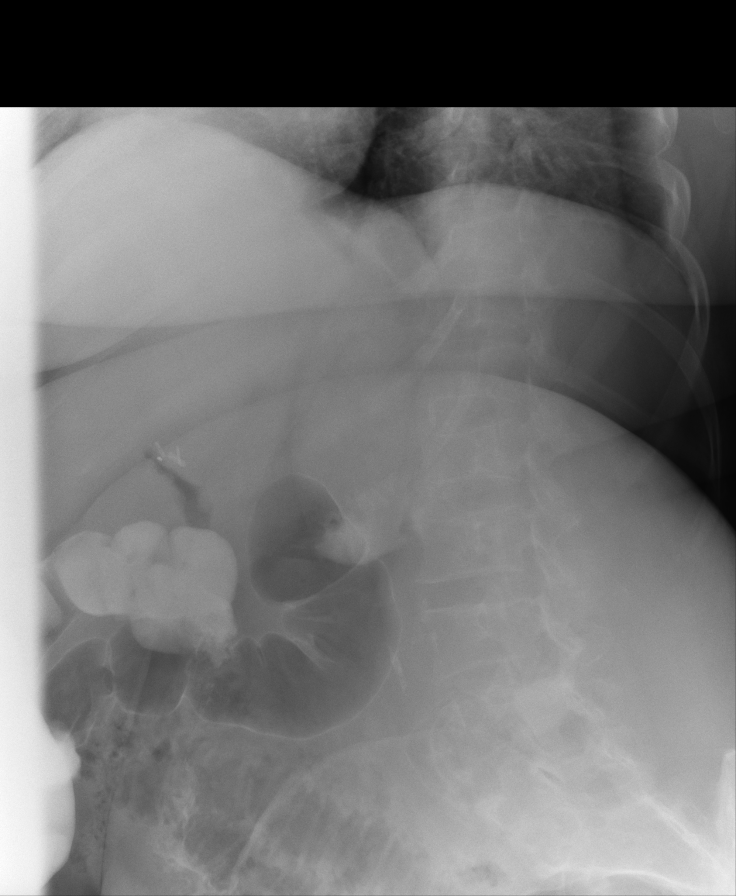
[im 8/8]
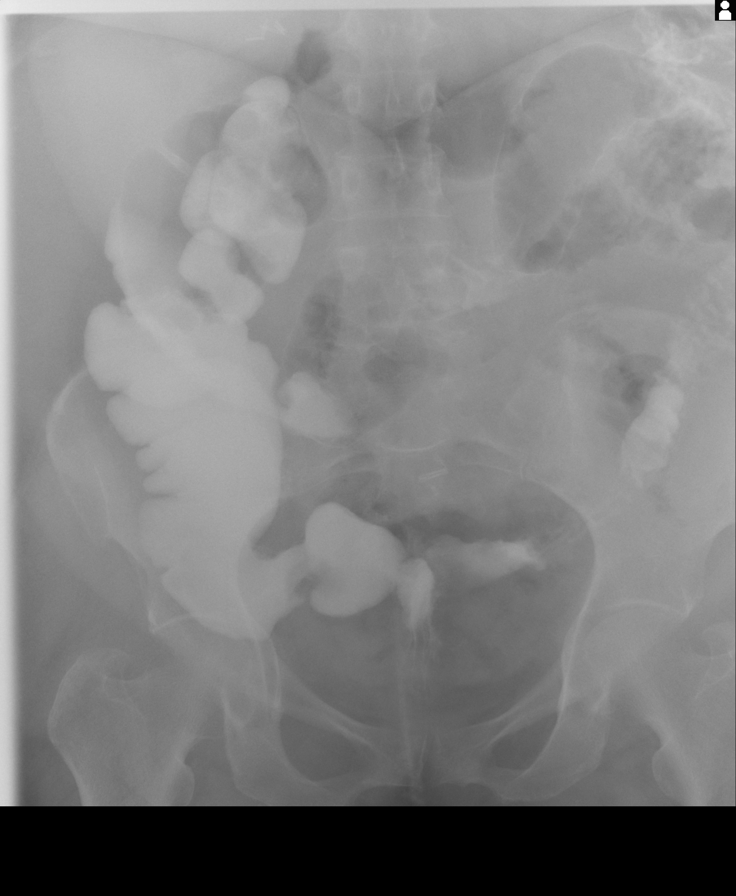

[8 of 8 positions shown; findings below may reference images not displayed]

FINDINGS: The patient was initially inadvertently catheterized in the vagina.
Gastrografin administered. No flow of contrast from the vagina to
the rectum noted. Following rectal catheterization scratch
Gastrografin enema was performed. Mild residual vaginal contrast is
present. Slight increase in the volume of vaginal contrast appears
to occur from the scratched Gastrografin enema suggesting a tiny
fistula. This appears to be between the rectum and vagina. No other
focal abnormalities identified.
IMPRESSION: Very tiny rectovaginal fistula. If operative intervention is needed
a CT of the abdomen and pelvis with rectal contrast may prove useful
for further evaluation.

## 2016-09-09 IMAGING — CR DG CHEST 1V PORT
1 series · 1 of 1 positions shown · non-contrast
Comparison: Portable chest x-ray November 19, 2013

CLINICAL DATA: Chest pain and hemoptysis beginning today; history
of COPD and tobacco use; previous MI.

EXAM:
PORTABLE CHEST - 1 VIEW

[ap]
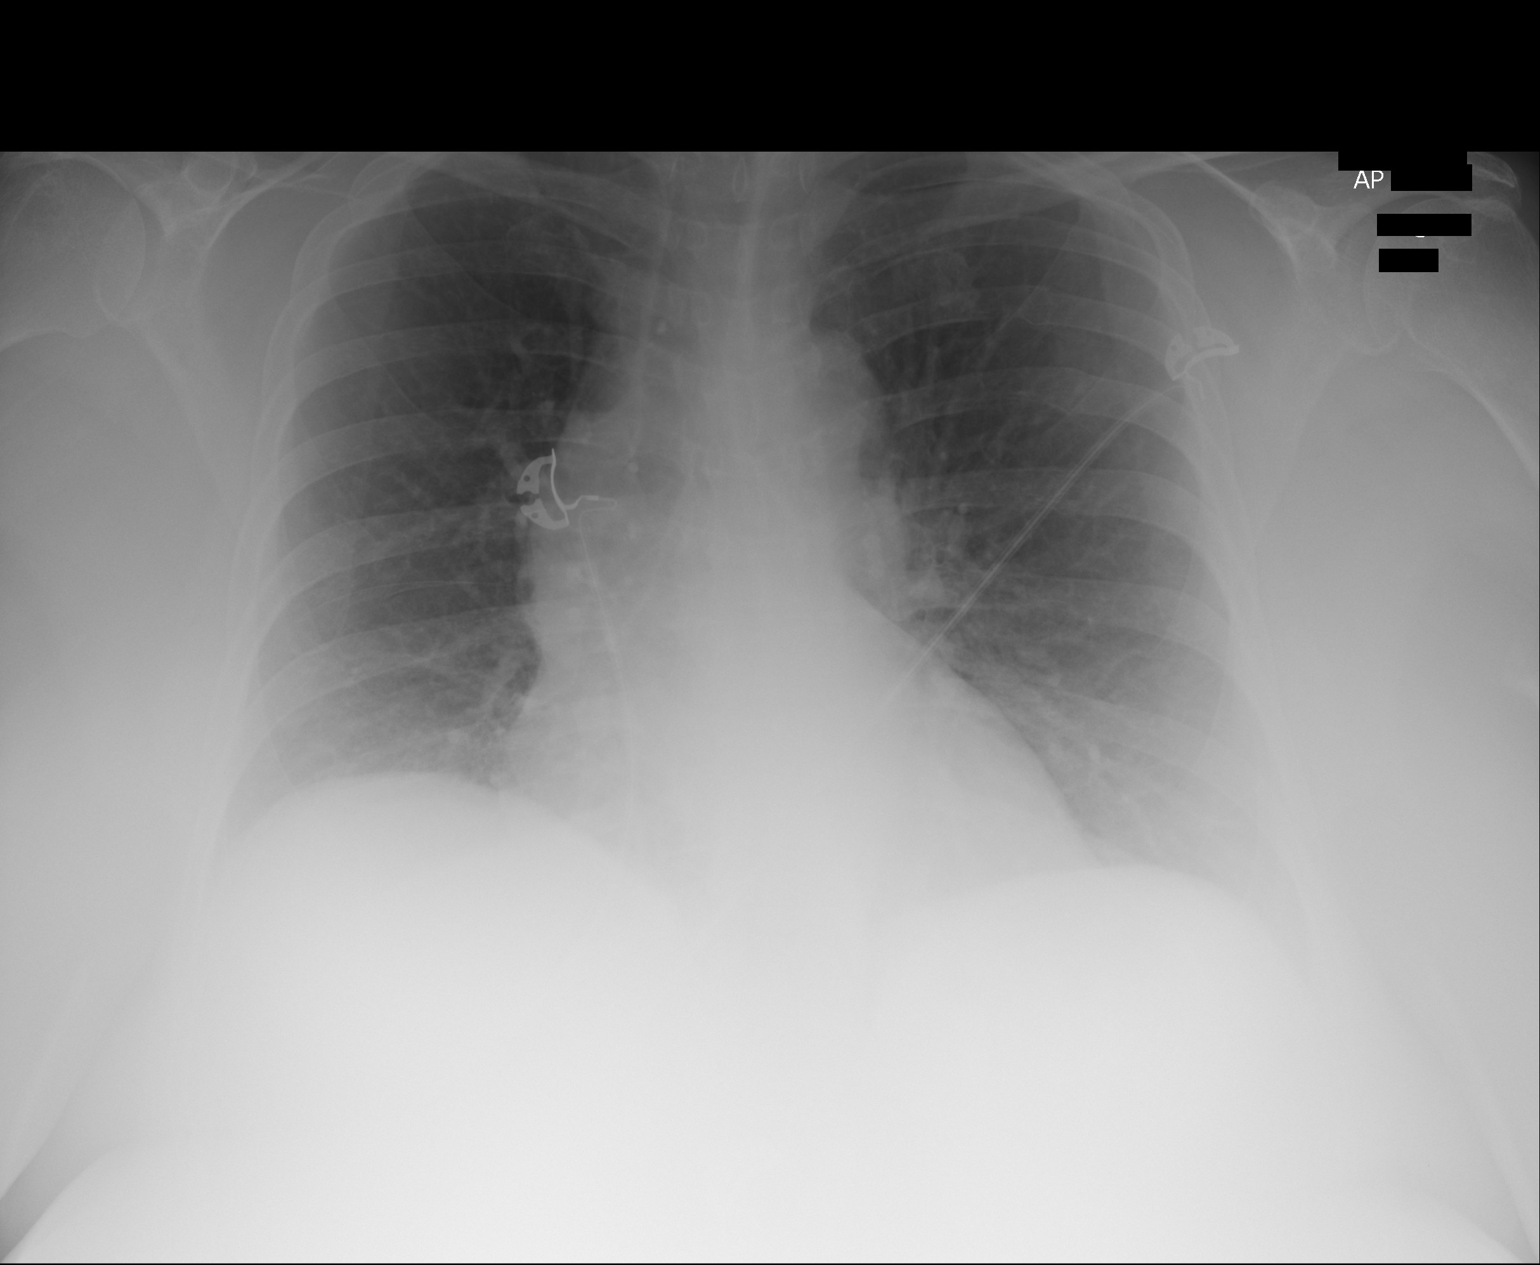

[1 of 1 positions shown; findings below may reference images not displayed]

FINDINGS: The lungs are adequately inflated and clear. The cardiac silhouette
is mildly enlarged though stable. The pulmonary vascularity is not
engorged. There is mild tortuosity of the ascending and descending
thoracic aorta which is stable. There is no pleural effusion or
pneumothorax. The observed bony thorax is unremarkable.
IMPRESSION: There is no active cardiopulmonary disease.

## 2016-10-12 IMAGING — CR DG CHEST 1V PORT
1 series · 2 of 2 positions shown · non-contrast
Comparison: Chest radiograph and chest CT September 30, 2014

CLINICAL DATA: Two day history of shortness of breath

EXAM:
PORTABLE CHEST - 1 VIEW

[Series 1: ap · 0.17mm/px · 2 of 2 slices shown]
[im 1/2]
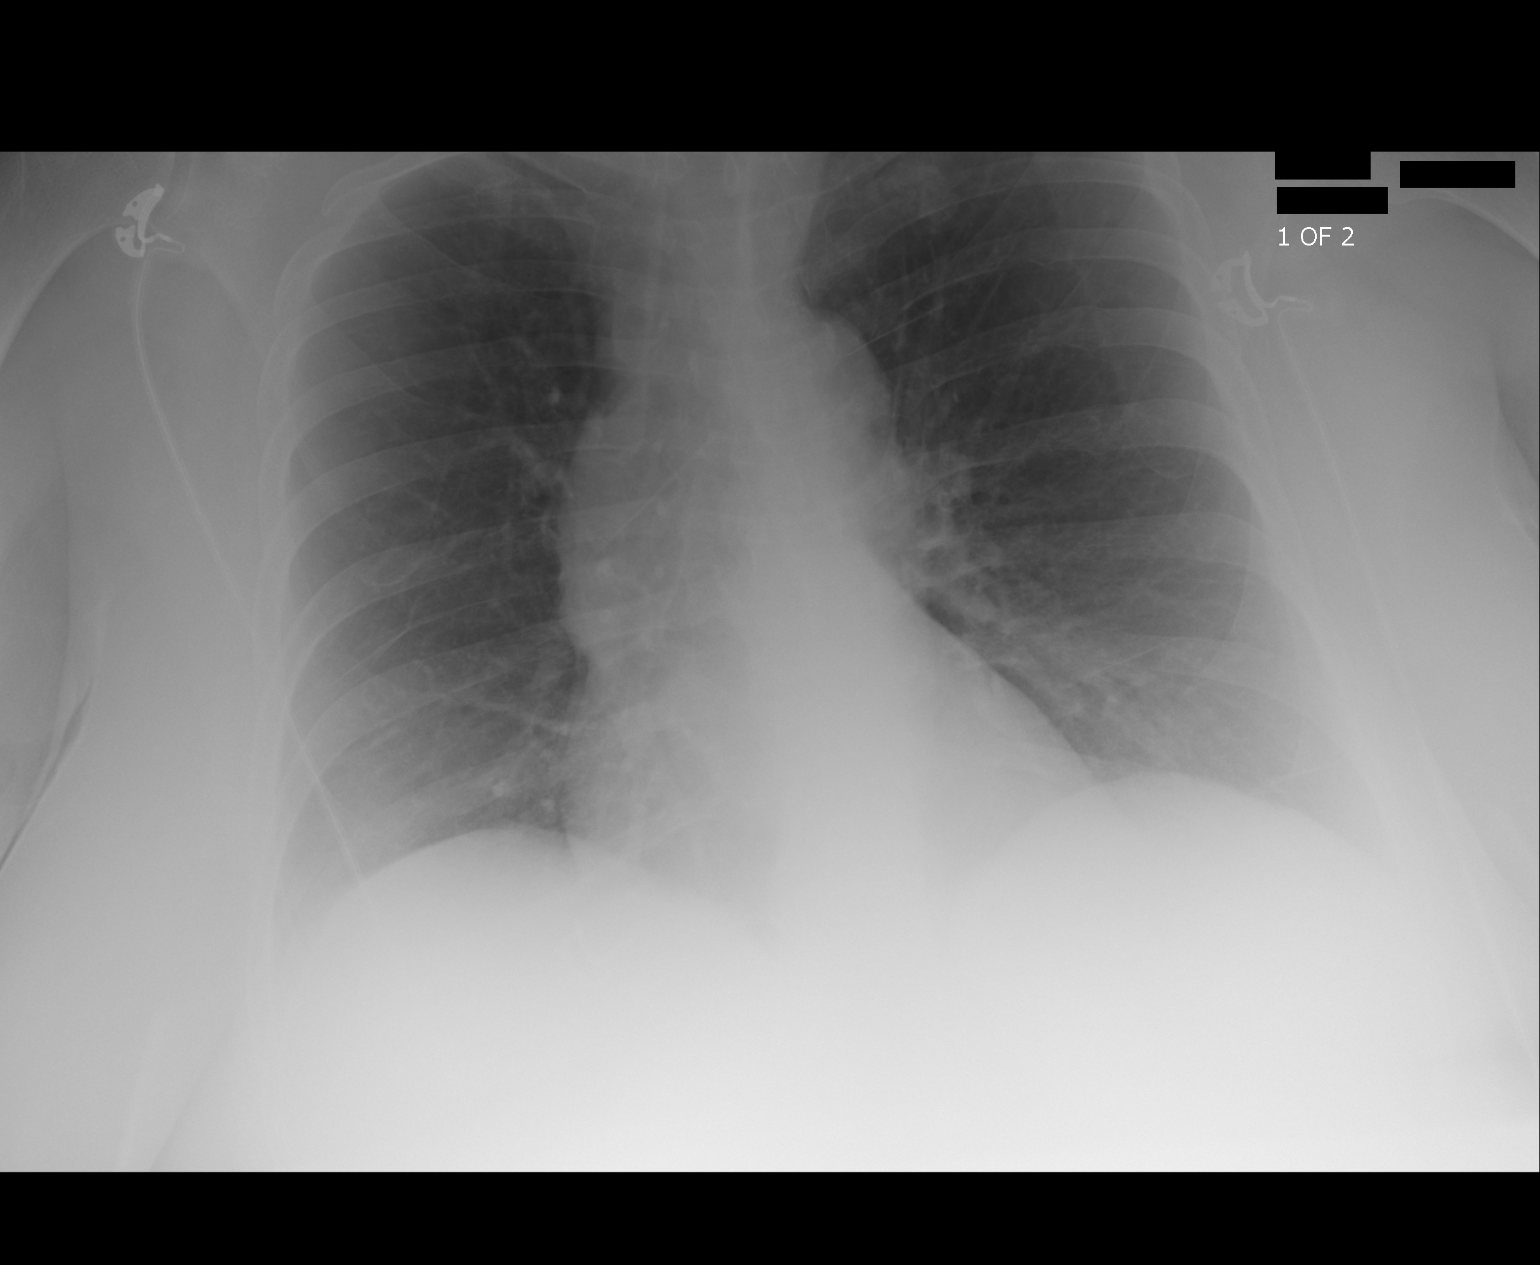
[im 2/2]
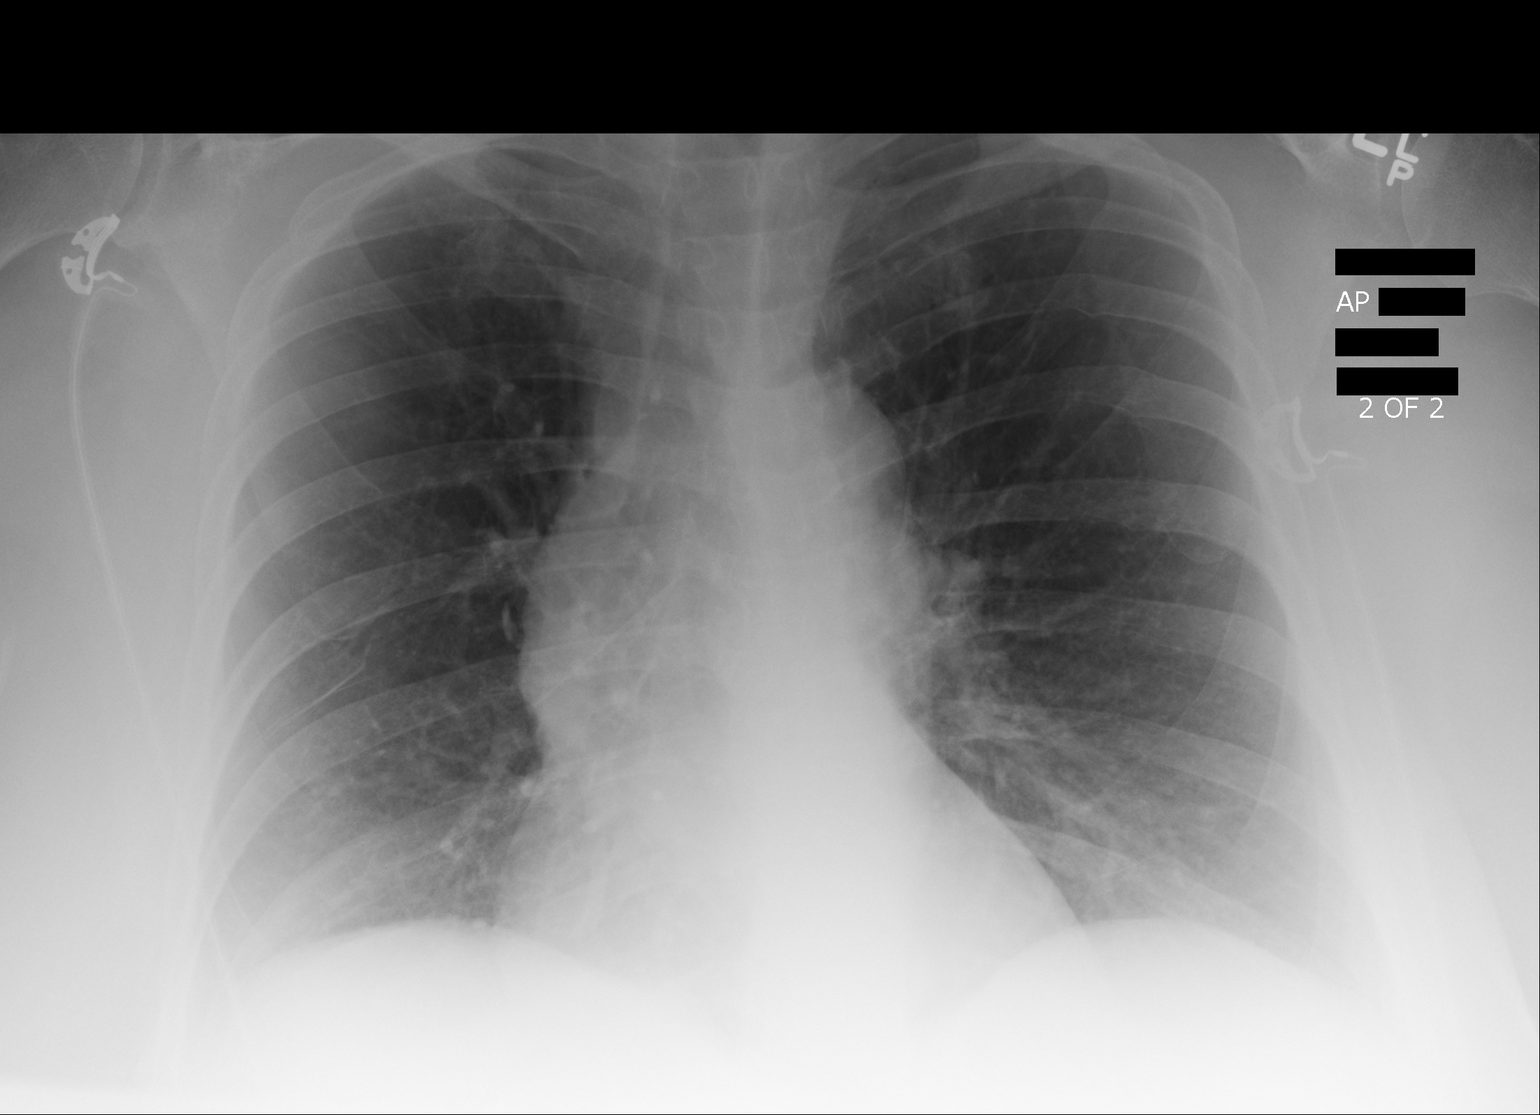

[2 of 2 positions shown; findings below may reference images not displayed]

FINDINGS: There is no appreciable edema or consolidation. The heart size and
pulmonary vascularity are within normal limits. No adenopathy. No
bone lesions.
IMPRESSION: No edema or consolidation.

## 2016-10-16 NOTE — Progress Notes (Deleted)
[facility] Consultation      Assessment and Plan:     Date: 10/16/2016  MRN# [id number] [name] [birth year]  Referring Physician:   ALIEGHA PAULLIN is a 61 y.o. old female seen in consultation for chief complaint of:   No chief complaint on file.   HPI:   The patient is a 61 y.o. 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   I personally review images;  CXR 02/26/16; reduced lung volumes due to obesity, interstitial edema.  I personally review images; CT AP lung cuts 03/11/16; bibasilar interstitial changes.   PMHX:   Past Medical History:  Diagnosis Date  . Allergy   . Anemia   . Anxiety   . CHF (congestive heart failure) ([place])   . Chronic bronchitis ([place])   . Chronic kidney disease   . Cirrhosis ([place])   . Depression   . Diabetes mellitus without complication ([place])   . Diverticulitis   . Emphysema of lung ([place])   . GERD (gastroesophageal reflux disease)   . H/O drug abuse    Clean since 2009  . H/O ETOH abuse    Sober since 2009  . Hepatitis C   . History of sepsis 2016  . Hypertension   . Malignant brain tumor ([place]) 2007  . Migraines   . Pancreatic ascites   . Pancreatitis, alcoholic 7622  . Rectovaginal fistula   . Seizures [facility])    Last seizure October 2017  . Spleen enlarged   . Stroke [facility] [place]) 2007, 2008   during brain surgery  . Urine incontinence    Surgical Hx:  Past Surgical History:  Procedure Laterality Date  . ABDOMINAL HYSTERECTOMY  1979  . APPENDECTOMY  1999  . BRAIN SURGERY  2007   malignant brain tumor  . CHOLECYSTECTOMY  2001  . COLON SURGERY  2006  . CRANIOPLASTY N/A 09/15/2015   Procedure: Cranioplasty with retrieval of bone flap from abdominal pocket;  Surgeon: Ashok Pall, MD;  Location: [facility] ORS;  Service: Neurosurgery;  Laterality: N/A;  Cranioplasty with retrieval of bone flap from  abdominal pocket  . CRANIOTOMY Right 08/16/2015   Procedure: Right Fronto-Temporal-Parietal Craniotomy for Evacuation of Hematoma with Bone Flap Placement in Abdomen;  Surgeon: Ashok Pall, MD;  Location: [facility]S;  Service: Neurosurgery;  Laterality: Right;  . CRANIOTOMY N/A 12/28/2015   Procedure: Craniectomy for wound debridement;  Surgeon: Ashok Pall, MD;  Location: [facility] ORS;  Service: Neurosurgery;  Laterality: N/A;  Craniectomy for wound debridement  . DILATION AND CURETTAGE OF UTERUS    . EYE SURGERY    . JOINT REPLACEMENT Left    TOTAL KNEE REPLACEMENT  . rectovaginal fistula repair w/ colostomy  2011  . REPLACEMENT TOTAL KNEE Left 2005   Family Hx:  Family History  Problem Relation Age of Onset  . Hypertension Mother   . Heart disease Father   . Diabetes Father   . Cancer Sister     brain  . Cancer Grandchild 8    brain tumor   Social Hx:   Social History  Substance Use Topics  . Smoking status: Current Every Day Smoker    Packs/day: 1.00    Types: Cigarettes  . Smokeless tobacco: Never Used  . Alcohol use 2.4 oz/week    4 Cans of beer per week   Medication:   @ENCMEDS @  Allergies:  Ibuprofen; Sulfa antibiotics; Hydroxyzine; Amoxicillin; Chocolate; Chocolate flavor; Ciprofloxacin; Dilaudid [hydromorphone hcl]; Fentanyl; Hydromorphone; Hydromorphone hcl; Metformin; Penicillins; Strawberry extract; Zofran [ondansetron hcl]; and Zofran [ondansetron]  Review of Systems: Gen:  Denies  fever, sweats, chills HEENT: Denies blurred vision, double vision. bleeds, sore throat Cvc:  No dizziness, chest pain. Resp:   Denies cough or sputum production, shortness of breath Gi: Denies swallowing difficulty, stomach pain. Gu:  Denies bladder incontinence, burning urine Ext:   No Joint pain, stiffness. Skin: No skin rash,  hives  Endoc:  No polyuria, polydipsia. Psych: No depression, insomnia. Other:  All other systems were reviewed with the patient and were  negative other that what is mentioned in the HPI.   Physical Examination:   VS: There were no vitals taken for this visit.  General Appearance: No distress  Neuro:without focal findings,  speech normal,  HEENT: PERRLA, EOM intact.   Pulmonary: normal breath sounds, No wheezing.  CardiovascularNormal S1,S2.  No m/r/g.   Abdomen: Benign, Soft, non-tender. Renal:  No costovertebral tenderness  GU:  No performed at this time. Endoc: No evident thyromegaly, no signs of acromegaly. Skin:   warm, no rashes, no ecchymosis  Extremities: normal, no cyanosis, clubbing.  Other findings:    LABORATORY PANEL:   CBC No results for input(s): WBC, HGB, HCT, PLT in the last 168 hours. ------------------------------------------------------------------------------------------------------------------  Chemistries  No results for input(s): NA, K, CL, CO2, GLUCOSE, BUN, CREATININE, CALCIUM, MG, AST, ALT, ALKPHOS, BILITOT in the last 168 hours.  Invalid input(s): GFRCGP ------------------------------------------------------------------------------------------------------------------  Cardiac Enzymes No results for input(s): TROPONINI in the last 168 hours. ------------------------------------------------------------  RADIOLOGY:  No results found.     Thank  you for the consultation and for allowing [facility] to assist in the care of your patient. Our recommendations are noted above.  Please contact us if we can be of further service.   Marda Stalker, MD.  Board Certified in Internal Medicine, Pulmonary Medicine, [place], and Sleep Medicine.  [facility] Office Number: [phone]  Patricia Pesa, M.D.  Merton Border, M.D  10/16/2016

## 2016-10-18 ENCOUNTER — Ambulatory Visit: Payer: Medicare Other | Admitting: Internal Medicine

## 2016-10-21 ENCOUNTER — Encounter: Payer: Self-pay | Admitting: Internal Medicine

## 2016-11-27 ENCOUNTER — Telehealth: Payer: Self-pay | Admitting: Obstetrics and Gynecology

## 2016-11-27 NOTE — Telephone Encounter (Signed)
Patient wants to know if you'll do her surgery - you promised her that if she needed it you would. The skin between her vagina and colon is completely gone. Please call

## 2016-11-27 NOTE — Telephone Encounter (Signed)
Pt dismissed from practice

## 2016-12-06 IMAGING — CR DG CHEST 2V
1 series · 2 of 2 positions shown · non-contrast
Comparison: 11/02/2014

CLINICAL DATA: Syncopal episode and dizziness and chest pain today.

EXAM:
CHEST  2 VIEW

[Series 1: dg chest 2 view · 0.14mm/px · 2 of 2 slices shown]
[im 1/2]
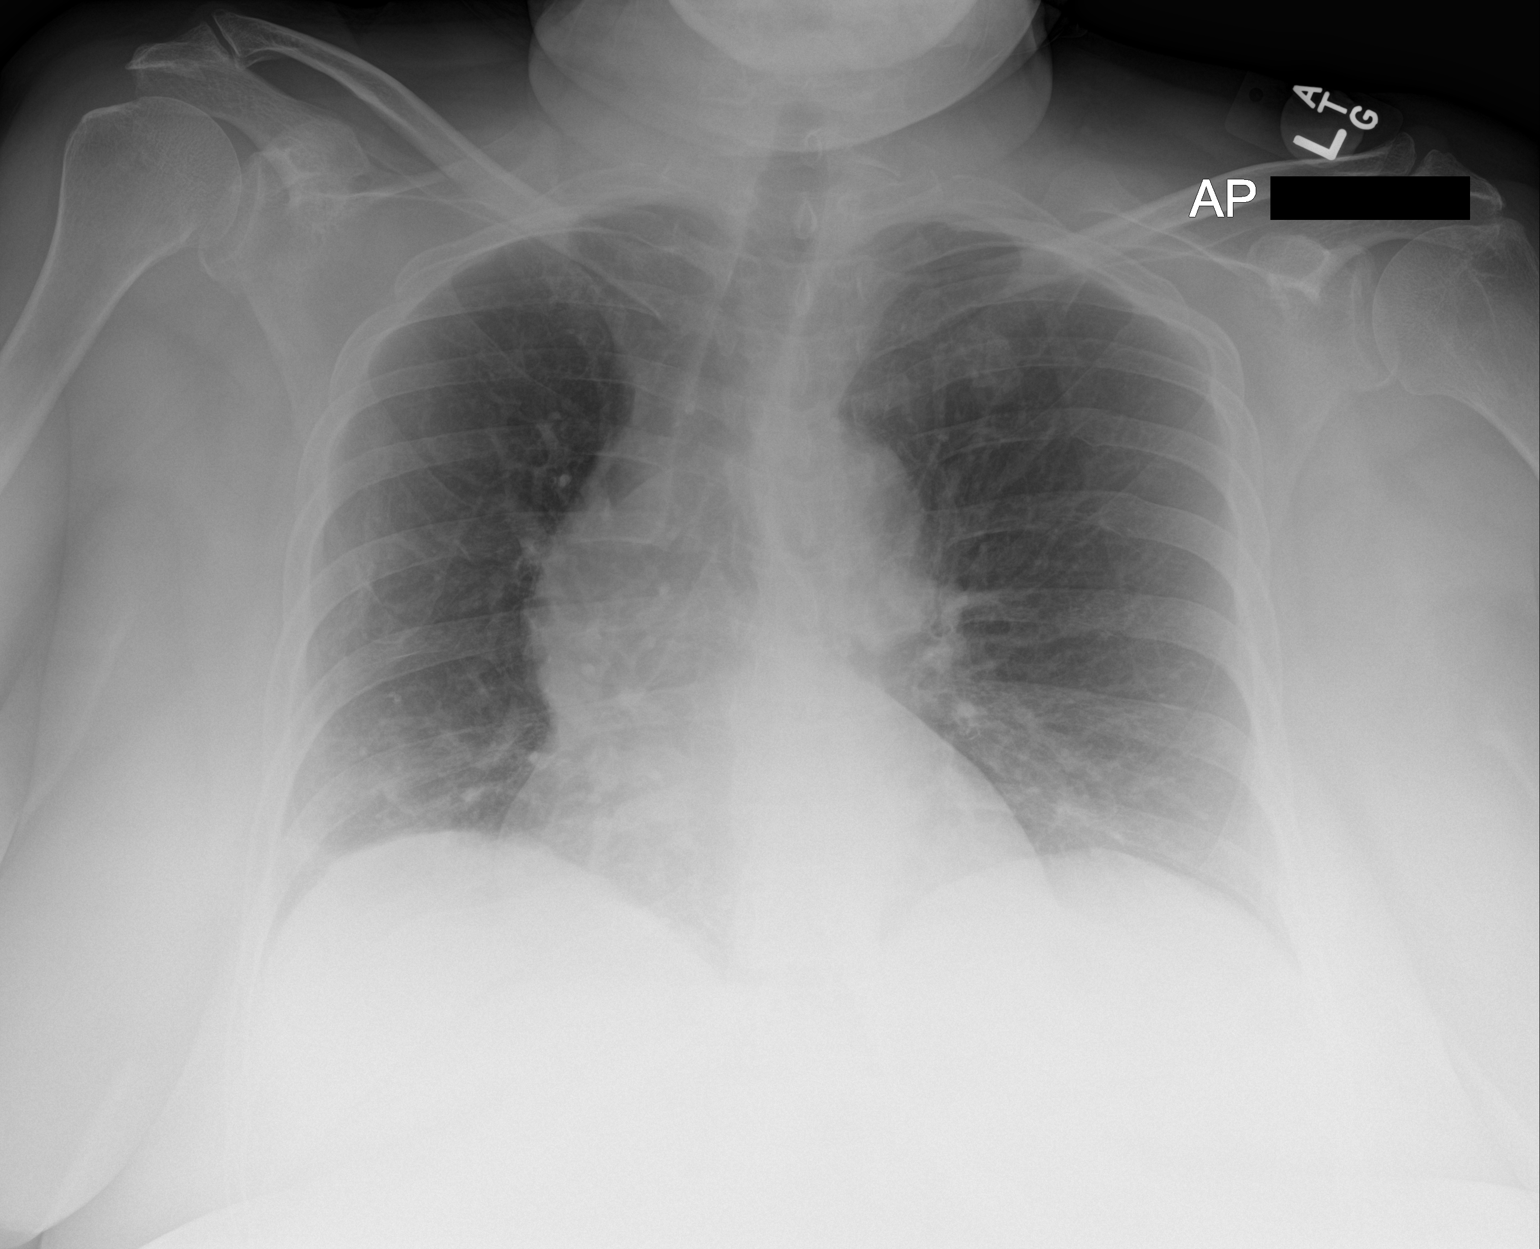
[im 2/2]
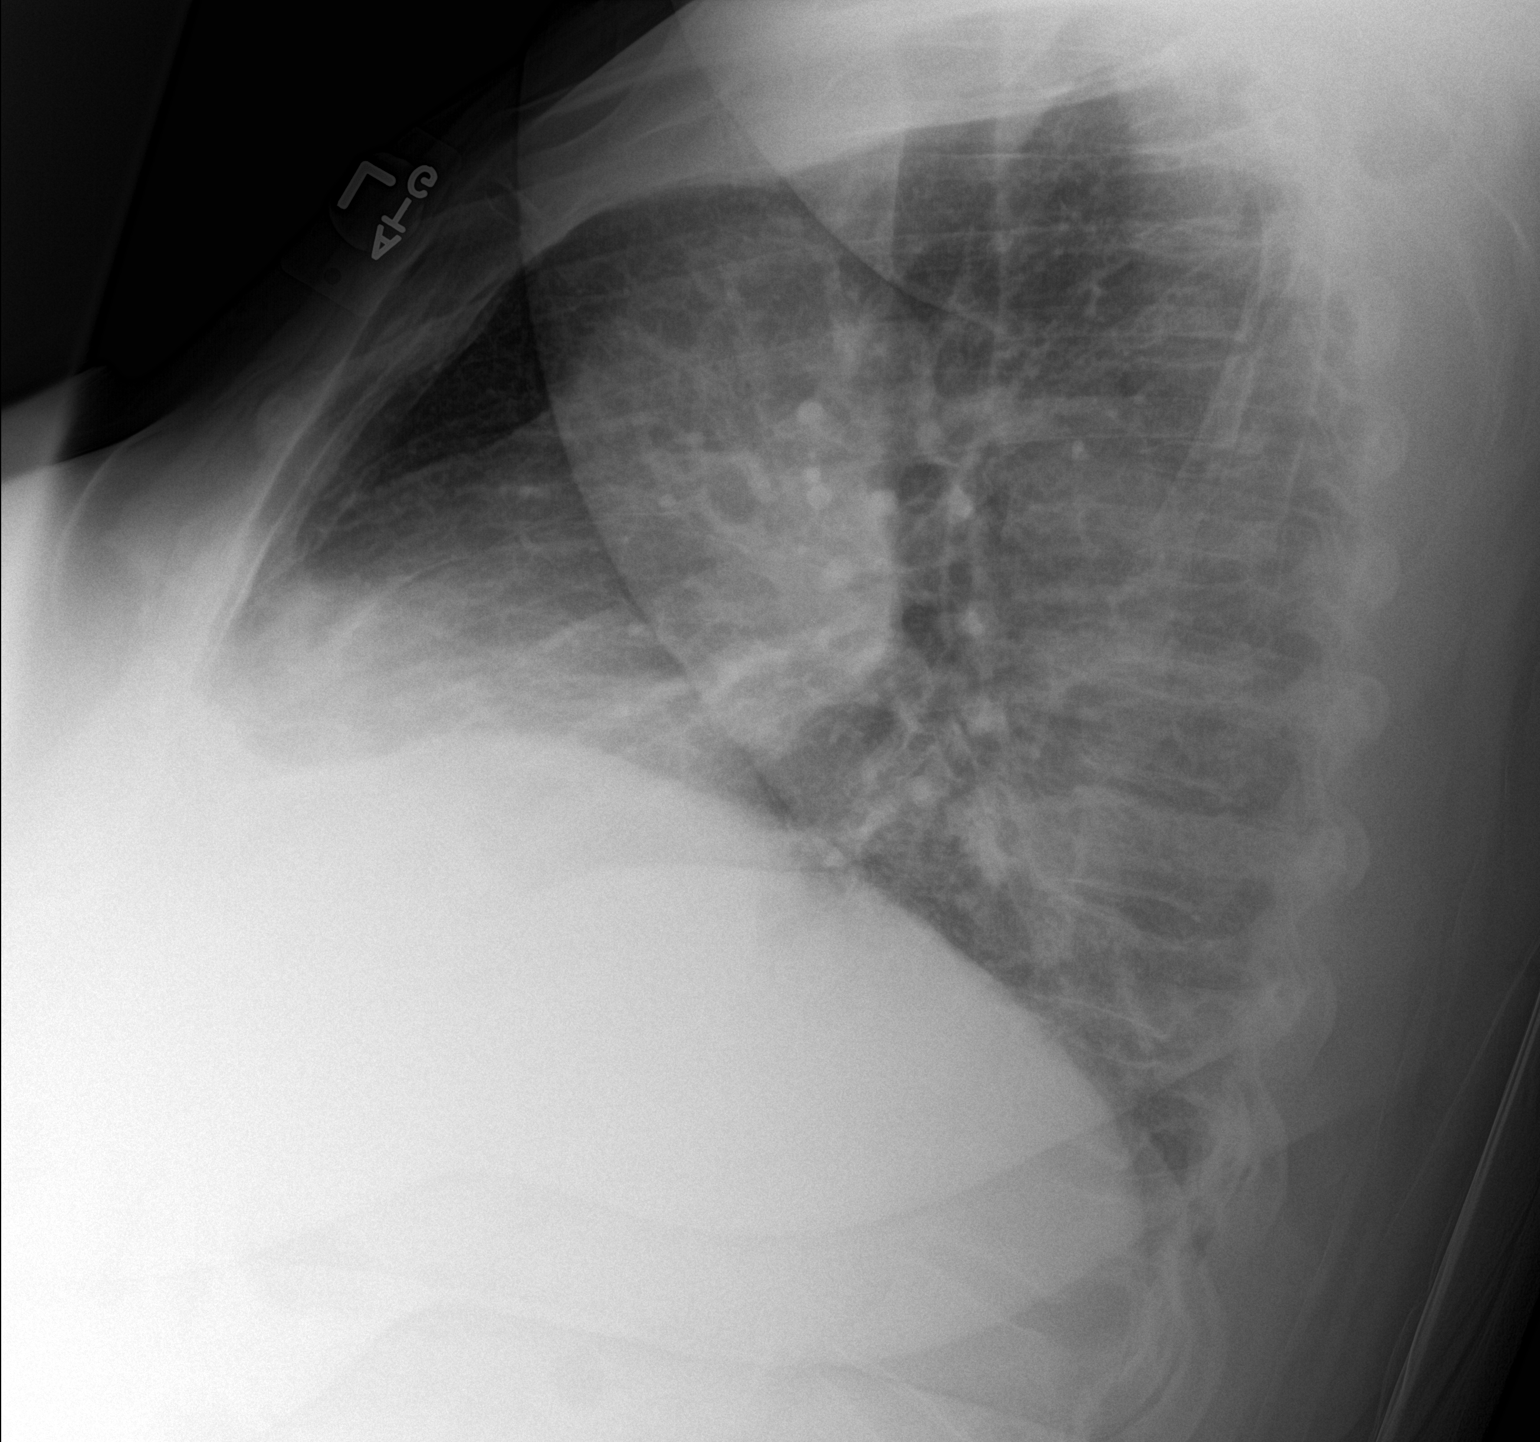

[2 of 2 positions shown; findings below may reference images not displayed]

FINDINGS: The heart is within normal limits in size and stable. There is
stable tortuosity and ectasia of the thoracic aorta. The lungs are
clear. No pleural effusion. The bony thorax is intact.
IMPRESSION: No acute cardiopulmonary findings.

## 2016-12-11 ENCOUNTER — Encounter: Payer: Self-pay | Admitting: Obstetrics & Gynecology

## 2016-12-11 ENCOUNTER — Ambulatory Visit (INDEPENDENT_AMBULATORY_CARE_PROVIDER_SITE_OTHER): Payer: Medicare Other | Admitting: Obstetrics & Gynecology

## 2016-12-11 VITALS — BP 120/80 | HR 100 | Ht 67.0 in | Wt 272.0 lb

## 2016-12-11 DIAGNOSIS — B373 Candidiasis of vulva and vagina: Secondary | ICD-10-CM | POA: Diagnosis not present

## 2016-12-11 DIAGNOSIS — N898 Other specified noninflammatory disorders of vagina: Secondary | ICD-10-CM

## 2016-12-11 DIAGNOSIS — B3731 Acute candidiasis of vulva and vagina: Secondary | ICD-10-CM

## 2016-12-11 MED ORDER — TERCONAZOLE 0.4 % VA CREA: 1.0000 | TOPICAL_CREAM | Freq: Every day | VAGINAL | 1 refills | Status: AC

## 2016-12-11 NOTE — Progress Notes (Signed)
  History of Present Illness:  [name] is a 61 y.o. who has had chronic issues with vag d/c, discomfort, and stool per vagina for years.  Presents with similar c/os today and also request for pain medicine.  Tells me she is labeled terminal by other doctors and they dont care for her well.  Has had recent brain surgery.  Says she has stomach tumor.  Has had prior hysterectomy.  Has been seen by self and other GYN and tertiary care referral GYN for these conditions in past.  PMHx: She  has a past medical history of Allergy; Anemia; Anxiety; CHF (congestive heart failure) ([place]); Chronic bronchitis ([place]); Chronic kidney disease; Cirrhosis ([place]); Depression; Diabetes mellitus without complication ([place]); Diverticulitis; Emphysema of lung ([place]); GERD (gastroesophageal reflux disease); H/O drug abuse; H/O ETOH abuse; Hepatitis C; History of sepsis (2016); Hypertension; Malignant brain tumor ([place]) (2007); Migraines; Pancreatic ascites; Pancreatitis, alcoholic (4132); Rectovaginal fistula; Seizures ([place]); Spleen enlarged; Stroke [facility]) (2007, 2008); and Urine incontinence. Also,  has a past surgical history that includes Appendectomy (1999); Eye surgery; rectovaginal fistula repair w/ colostomy (2011); Brain surgery (2007); Cholecystectomy (2001); Abdominal hysterectomy (1979); Craniotomy (Right, 08/16/2015); Cranioplasty (N/A, 09/15/2015); Craniotomy (N/A, 12/28/2015); Replacement total knee (Left, 2005); Dilation and curettage of uterus; Joint replacement (Left); and Colon surgery (2006)., family history includes Cancer in her sister; Cancer (age of onset: 62) in her grandchild; Diabetes in her father; Heart disease in her father; Hypertension in her mother.,  reports that she has been smoking Cigarettes.  She has been smoking about 1.00 pack per day. She has never used smokeless tobacco. She reports that she drinks about 2.4 oz of alcohol per week . She reports that she uses drugs, including Cocaine. No outpatient  prescriptions have been marked as taking for the 12/11/16 encounter (Office Visit) with Gae Dry, MD.  . Also, is allergic to ibuprofen; sulfa antibiotics; hydroxyzine; amoxicillin; chocolate; chocolate flavor; ciprofloxacin; dilaudid [hydromorphone hcl]; fentanyl; hydromorphone; hydromorphone hcl; metformin; penicillins; strawberry extract; zofran [ondansetron hcl]; and zofran [ondansetron]..  Review of Systems  All other systems reviewed and are negative.  Physical Exam:  BP 120/80   Pulse 100   Ht 5\' 7"  (1.702 m)   Wt 272 lb (123.4 kg)   BMI 42.60 kg/m  Body mass index is 42.6 kg/m. Constitutional: Well nourished, well developed female in no acute distress.  Psych:  Normal mood and affect.    Assessment:  Problem List Items Addressed This Visit      Genitourinary   Vaginal candida - Primary    Other Visit Diagnoses    Discharge from the vagina         Plan: She will undergo Terazol for vaginal itch in her medical therapy.  Knows she needs to see tertiary care specialist to discuss fistula.  No pain medicine prescribed today as no known GYN source of chronic pain.    She was amenable to this plan.  A total of 15 minutes were spent face-to-face with the patient during this encounter and over half of that time dealt with counseling and coordination of care.  Barnett Applebaum, MD, [facility], [facility] 12/11/2016  8:30 AM

## 2016-12-26 ENCOUNTER — Ambulatory Visit: Payer: Medicare Other | Admitting: Podiatry

## 2017-01-13 ENCOUNTER — Ambulatory Visit: Payer: Medicare Other | Admitting: Podiatry

## 2017-03-27 IMAGING — CR DG CHEST 2V
1 series · 2 of 2 positions shown · non-contrast
Comparison: 12/27/2014 and earlier.

CLINICAL DATA: 59-year-old female with nausea and hematemesis.
Shortness of breath for 3 days and cough. Initial encounter.

EXAM:
CHEST  2 VIEW

[Series 1: dg chest 2 view · 0.14mm/px · 2 of 2 slices shown]
[im 1/2]
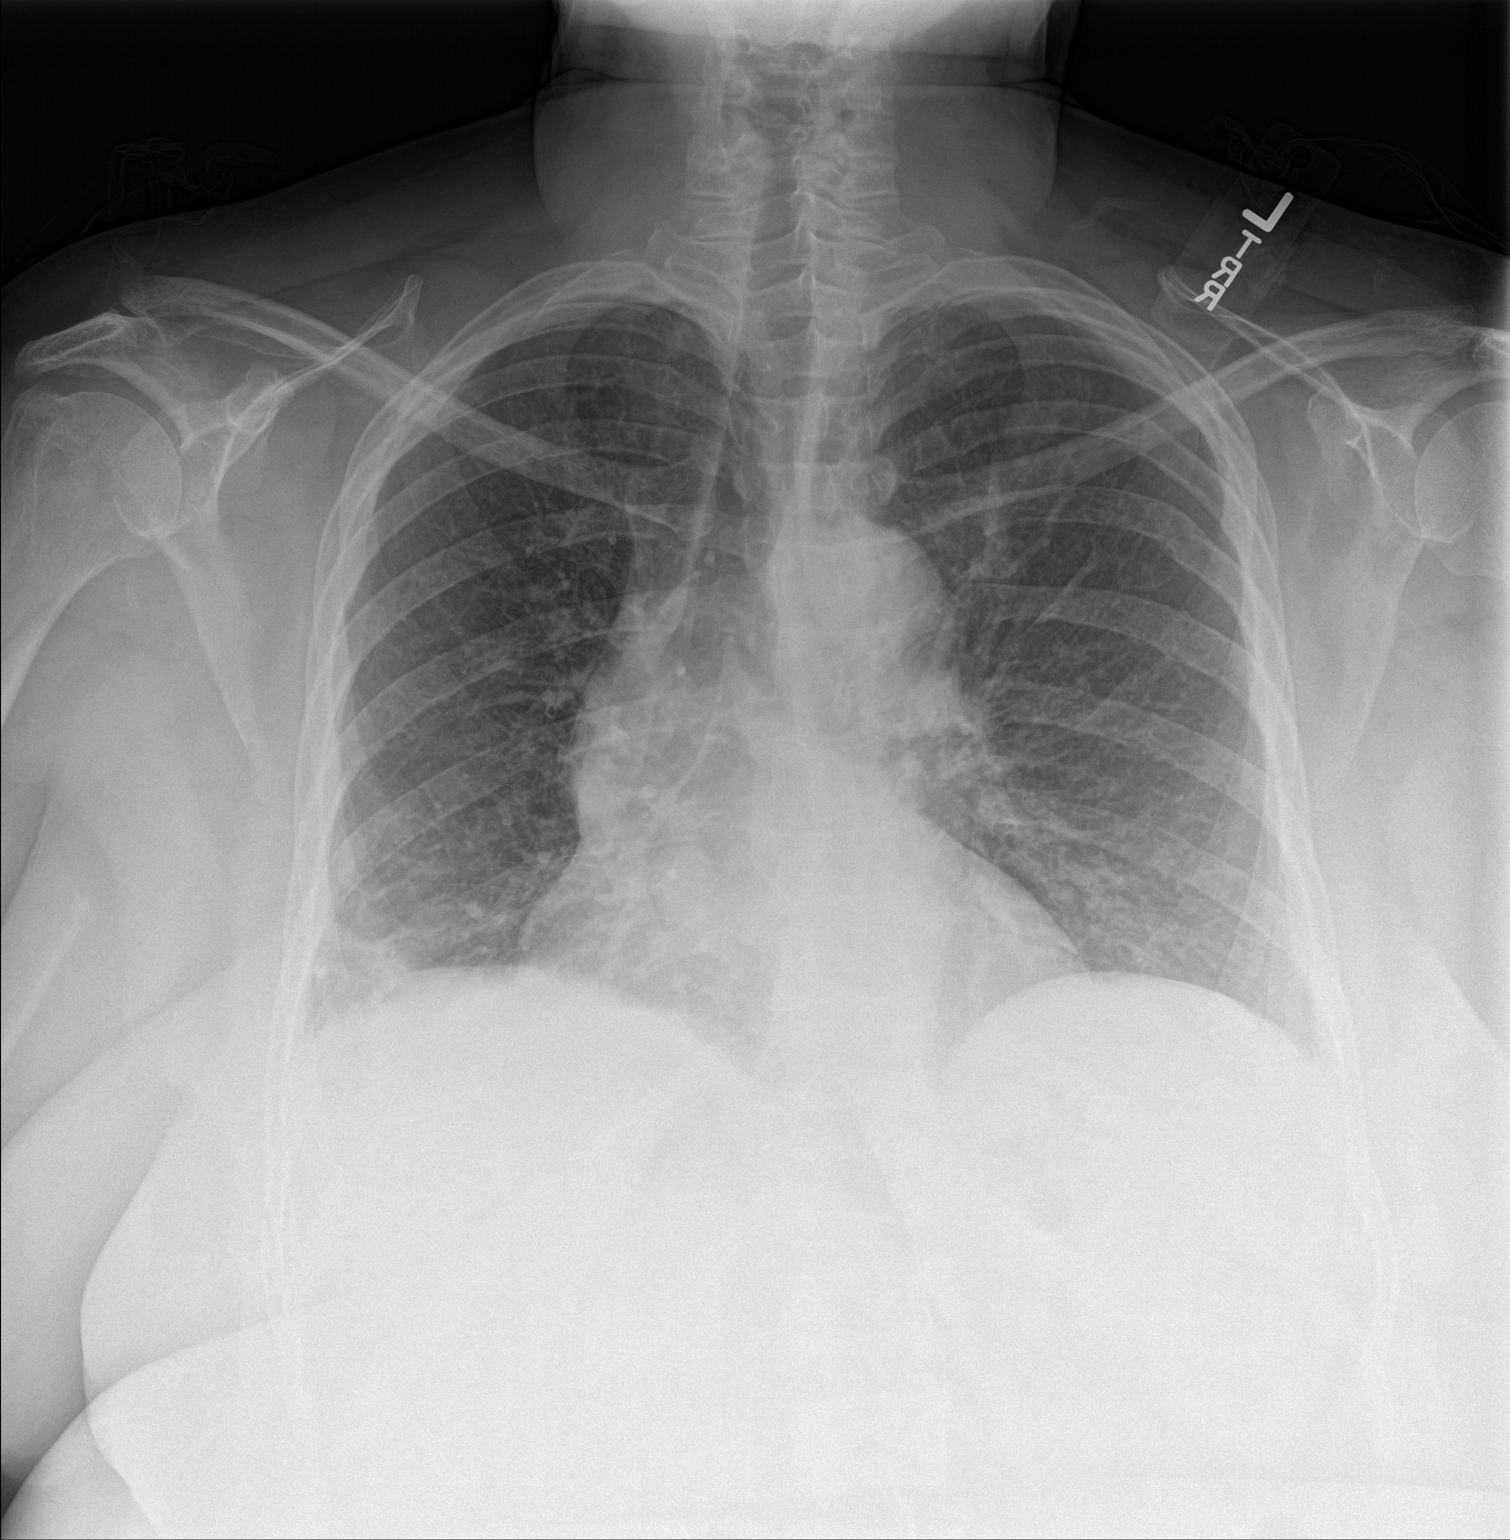
[im 2/2]
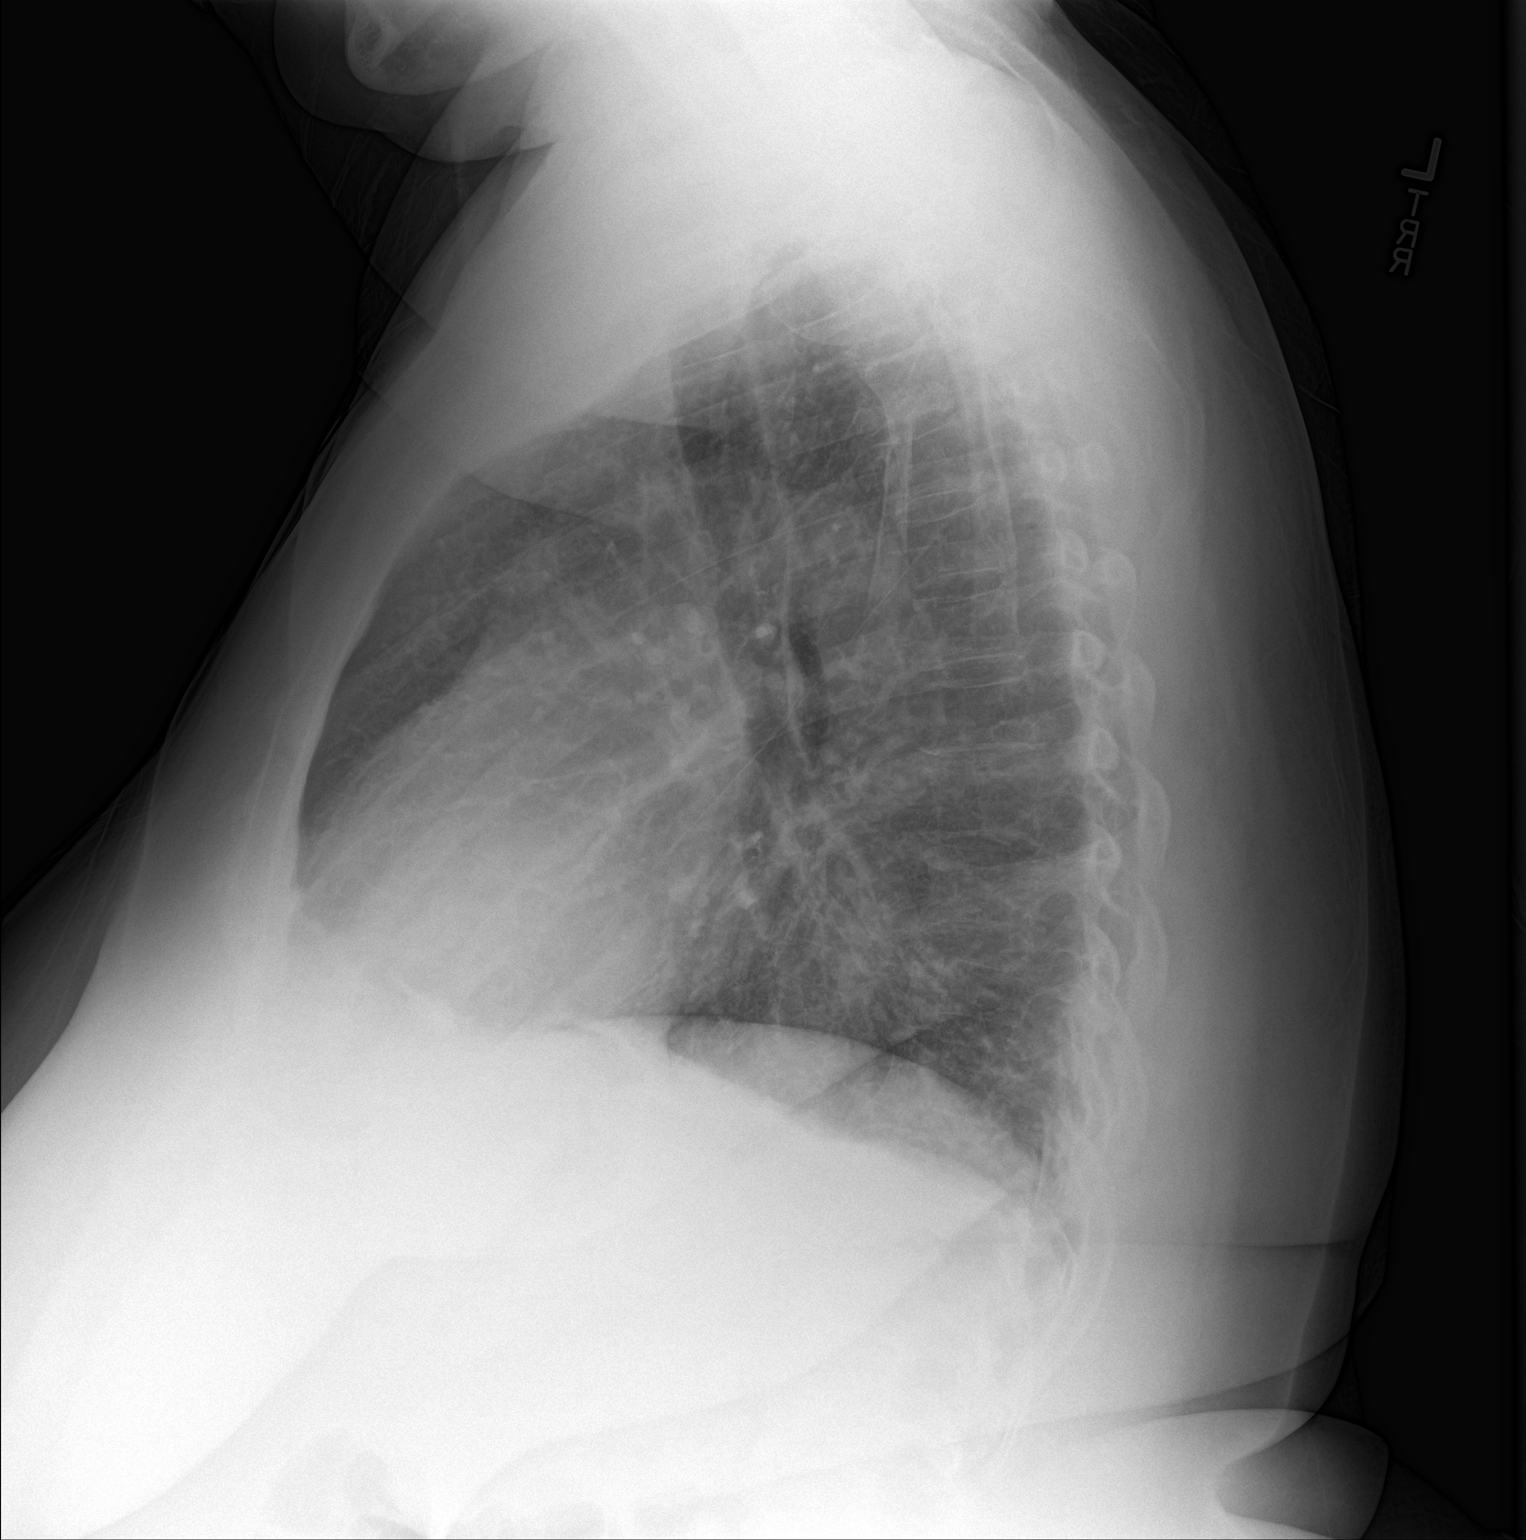

[2 of 2 positions shown; findings below may reference images not displayed]

FINDINGS: Large body habitus. Stable lung volumes. Stable cardiac size and
mediastinal contours. No pneumothorax, pulmonary edema, pleural
effusion or consolidation. Mildly increased patchy opacity at the
lateral right lung base seen only on the frontal view. No acute
osseous abnormality identified. No pneumoperitoneum. Paucity of
bowel gas in the upper abdomen.
IMPRESSION: Increased patchy right lung base opacity suspicious for developing
pneumonia in this setting. No pleural effusion.

## 2017-04-23 IMAGING — CR DG CHEST 2V
1 series · 2 of 2 positions shown · non-contrast
Comparison: Radiographs 04/17/2015

CLINICAL DATA: Cough and shortness of breath. Posterior rib pain.
Recent diagnosis of pneumonia.

EXAM:
CHEST  2 VIEW

[Series 1: dg chest 2 view · 0.14mm/px · 2 of 2 slices shown]
[im 1/2]
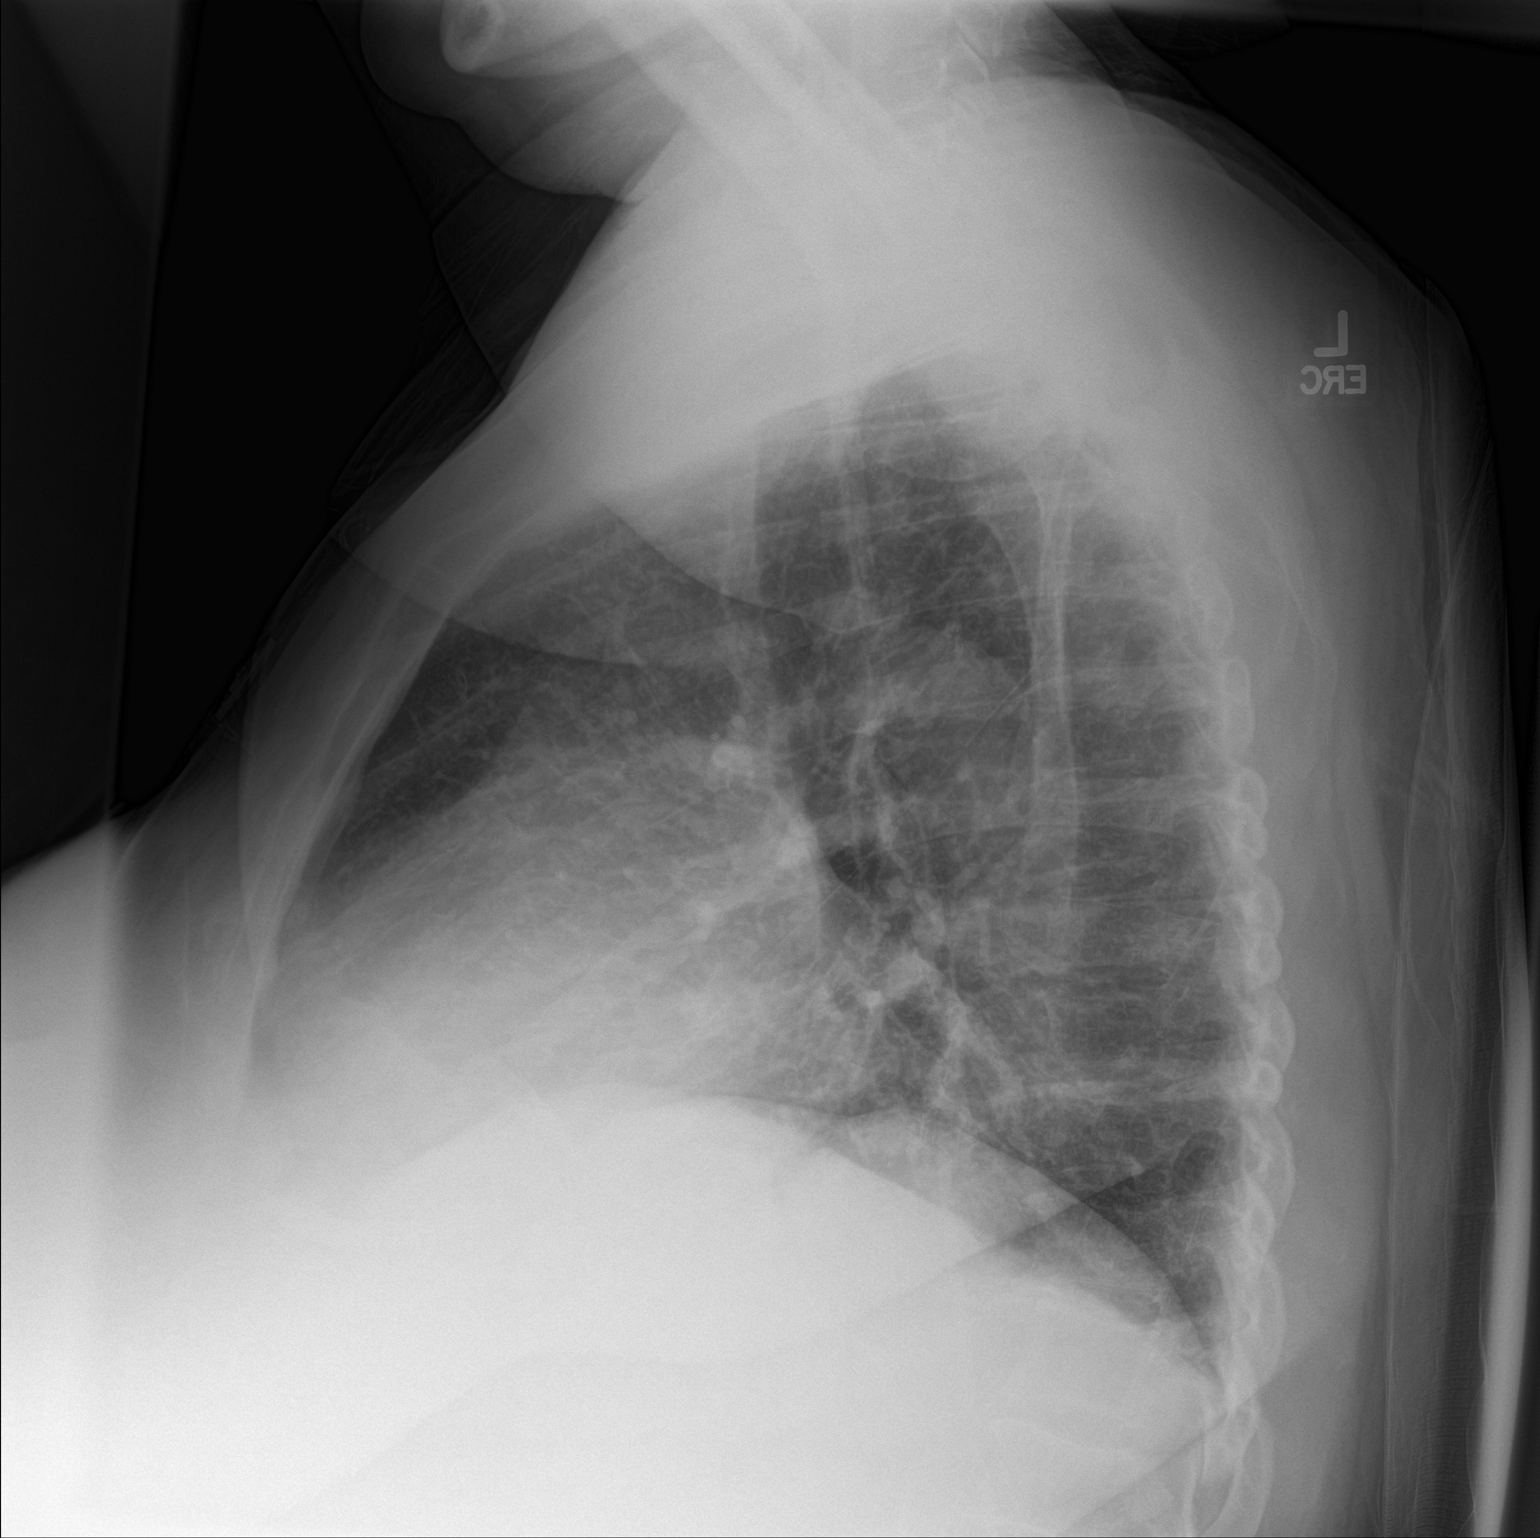
[im 2/2]
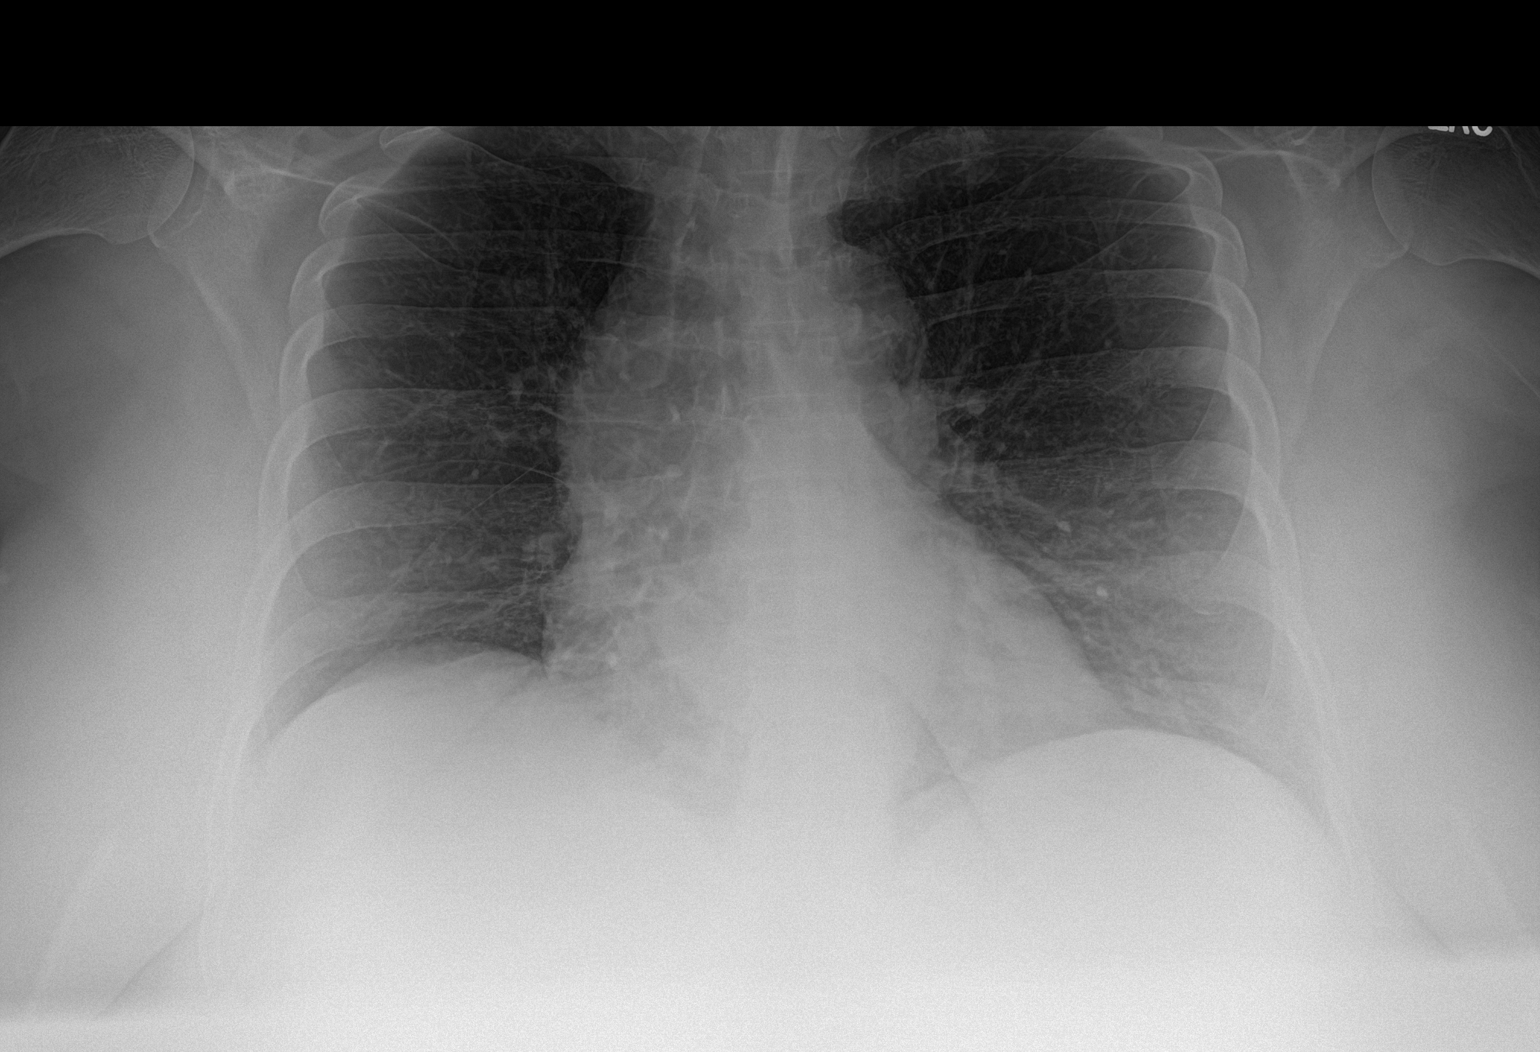

[2 of 2 positions shown; findings below may reference images not displayed]

FINDINGS: Clearing of right lower lobe consolidation. No new airspace disease.
Cardiomediastinal contours are unchanged, heart at the upper limits
normal in size. There is tortuosity of the thoracic aorta. No
pulmonary edema, pleural effusion or pneumothorax. No acute osseous
abnormalities are seen.
IMPRESSION: Resolution of previous opacity at the right lung base. No no
abnormalities are seen.

## 2017-04-30 IMAGING — CR DG CHEST 2V
1 series · 2 of 2 positions shown · non-contrast
Comparison: 05/14/2015 and 04/17/2015

CLINICAL DATA: Nausea with low back pain and productive cough.
Possible sepsis.

EXAM:
CHEST  2 VIEW

[Series 1: w chest pa · 0.14mm/px · 2 of 2 slices shown]
[im 1/2]
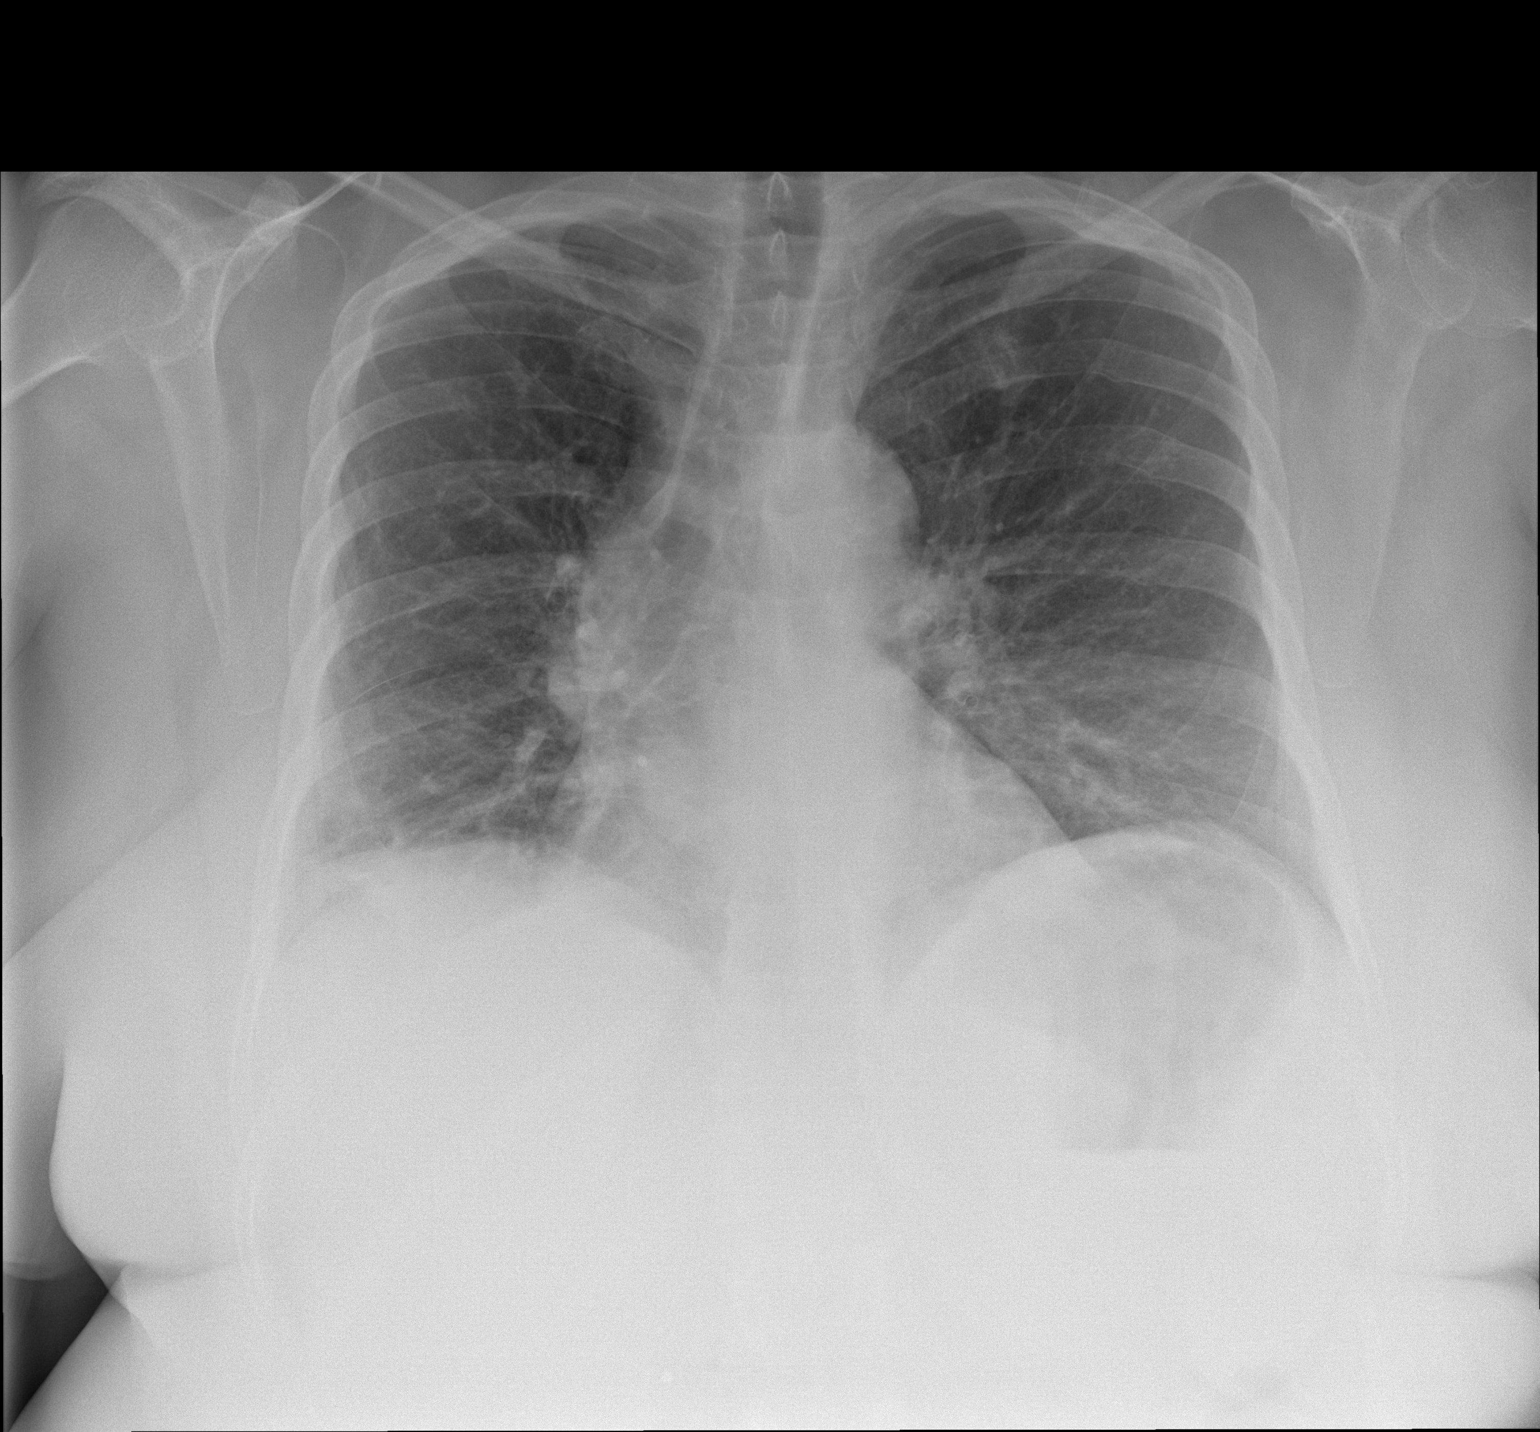
[im 2/2]
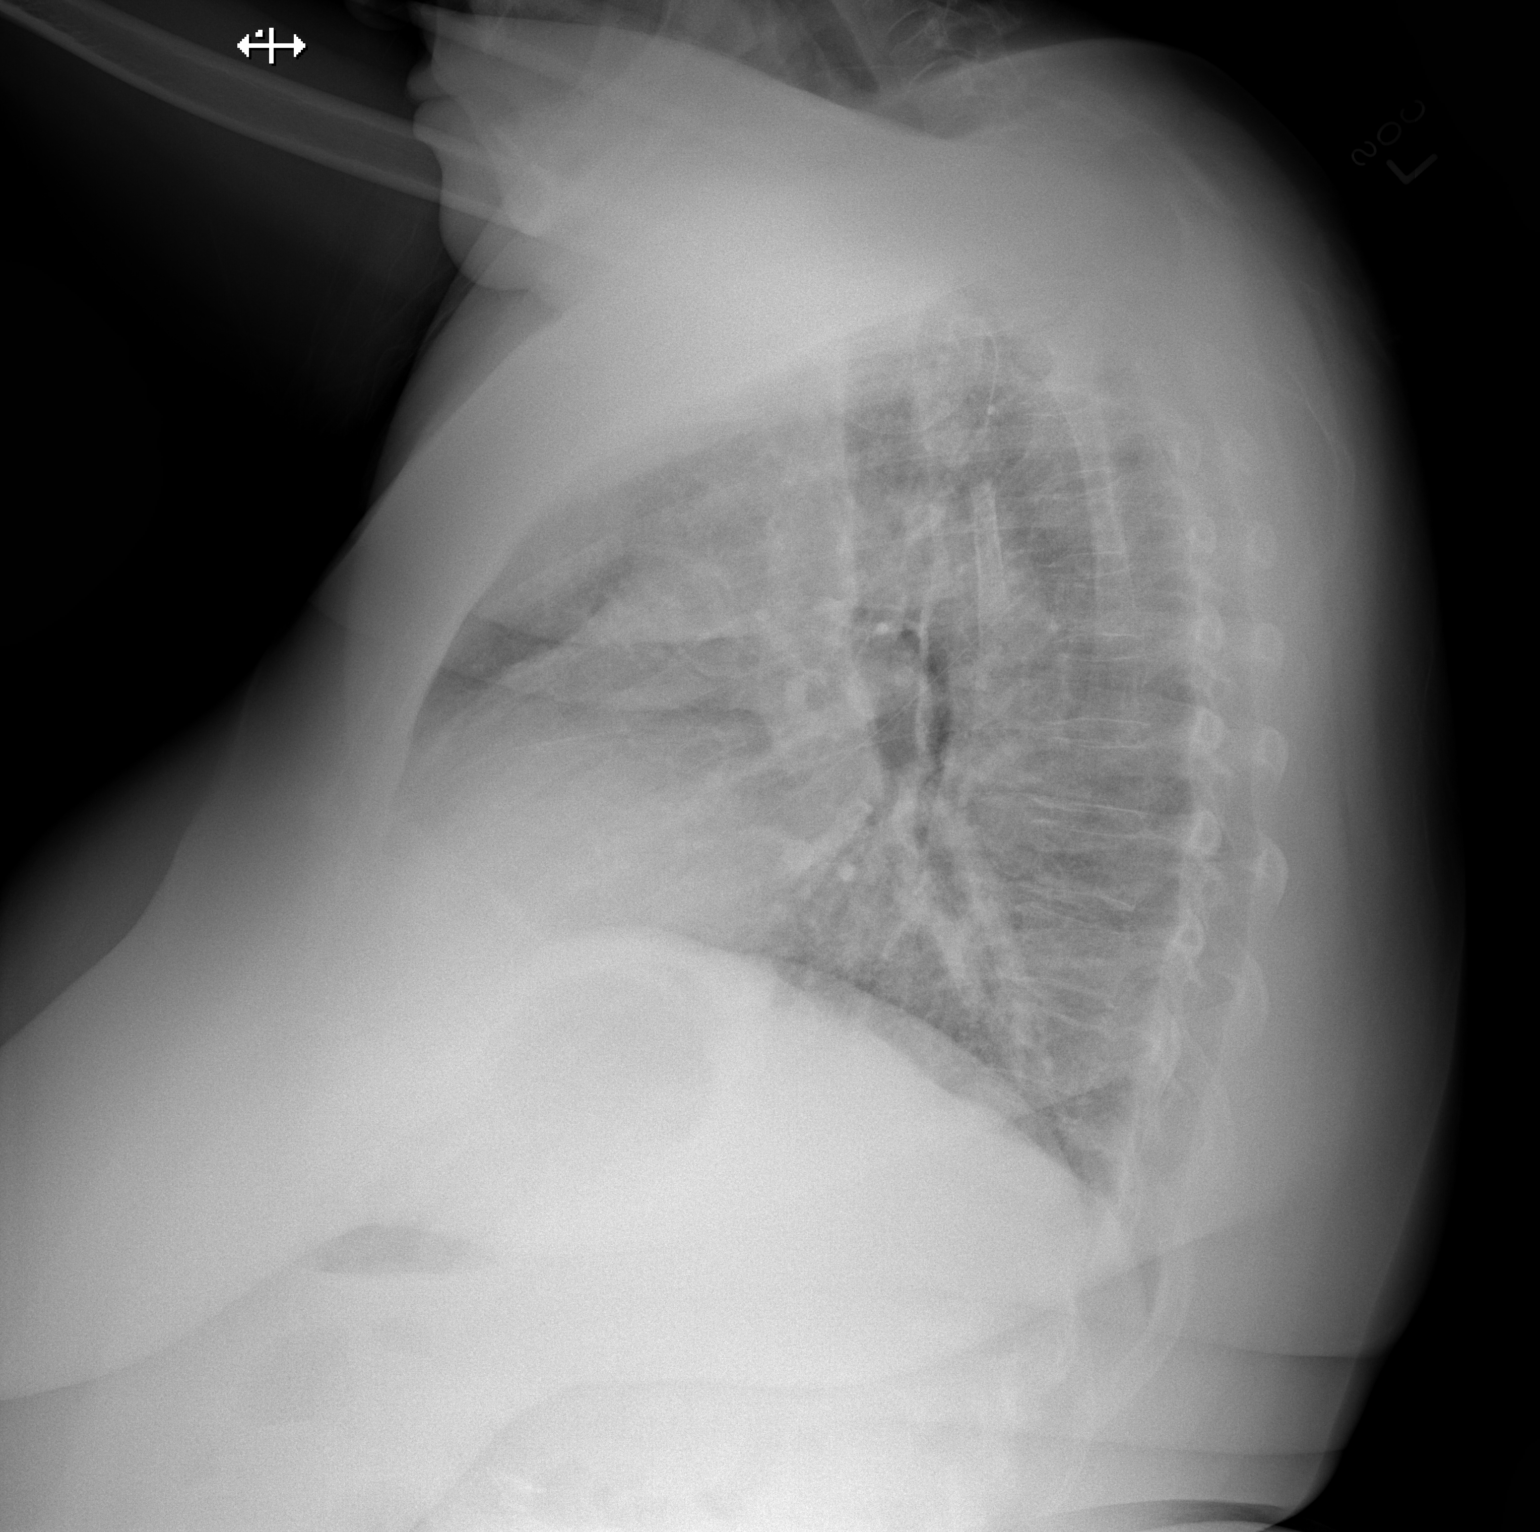

[2 of 2 positions shown; findings below may reference images not displayed]

FINDINGS: Lungs are hypoinflated with subtle stable opacification over the
lateral right base. No acute consolidation or effusion.
Cardiomediastinal silhouette and remainder of the exam is unchanged.
IMPRESSION: No acute cardiopulmonary disease.

Stable minimal opacification lateral right base likely atelectasis.

## 2017-07-13 ENCOUNTER — Observation Stay
Admission: EM | Admit: 2017-07-13 | Discharge: 2017-07-15 | Disposition: A | Discharge status: Home / Self Care | Payer: Medicare Other | Attending: Internal Medicine | Admitting: Internal Medicine

## 2017-07-13 ENCOUNTER — Encounter: Payer: Self-pay | Admitting: Emergency Medicine

## 2017-07-13 ENCOUNTER — Emergency Department: Payer: Medicare Other

## 2017-07-13 ENCOUNTER — Other Ambulatory Visit: Payer: Self-pay

## 2017-07-13 DIAGNOSIS — F17210 Nicotine dependence, cigarettes, uncomplicated: Secondary | ICD-10-CM | POA: Insufficient documentation

## 2017-07-13 DIAGNOSIS — F1011 Alcohol abuse, in remission: Secondary | ICD-10-CM | POA: Diagnosis not present

## 2017-07-13 DIAGNOSIS — Z79891 Long term (current) use of opiate analgesic: Secondary | ICD-10-CM | POA: Insufficient documentation

## 2017-07-13 DIAGNOSIS — D62 Acute posthemorrhagic anemia: Secondary | ICD-10-CM | POA: Insufficient documentation

## 2017-07-13 DIAGNOSIS — Z882 Allergy status to sulfonamides status: Secondary | ICD-10-CM | POA: Insufficient documentation

## 2017-07-13 DIAGNOSIS — Z8673 Personal history of transient ischemic attack (TIA), and cerebral infarction without residual deficits: Secondary | ICD-10-CM | POA: Insufficient documentation

## 2017-07-13 DIAGNOSIS — I130 Hypertensive heart and chronic kidney disease with heart failure and stage 1 through stage 4 chronic kidney disease, or unspecified chronic kidney disease: Secondary | ICD-10-CM | POA: Diagnosis not present

## 2017-07-13 DIAGNOSIS — I509 Heart failure, unspecified: Secondary | ICD-10-CM | POA: Diagnosis not present

## 2017-07-13 DIAGNOSIS — F419 Anxiety disorder, unspecified: Secondary | ICD-10-CM | POA: Diagnosis not present

## 2017-07-13 DIAGNOSIS — K3189 Other diseases of stomach and duodenum: Secondary | ICD-10-CM | POA: Insufficient documentation

## 2017-07-13 DIAGNOSIS — K861 Other chronic pancreatitis: Secondary | ICD-10-CM | POA: Diagnosis not present

## 2017-07-13 DIAGNOSIS — B1920 Unspecified viral hepatitis C without hepatic coma: Secondary | ICD-10-CM | POA: Diagnosis not present

## 2017-07-13 DIAGNOSIS — N390 Urinary tract infection, site not specified: Secondary | ICD-10-CM | POA: Diagnosis not present

## 2017-07-13 DIAGNOSIS — R1084 Generalized abdominal pain: Secondary | ICD-10-CM | POA: Diagnosis not present

## 2017-07-13 DIAGNOSIS — F1911 Other psychoactive substance abuse, in remission: Secondary | ICD-10-CM | POA: Diagnosis not present

## 2017-07-13 DIAGNOSIS — E1122 Type 2 diabetes mellitus with diabetic chronic kidney disease: Secondary | ICD-10-CM | POA: Diagnosis not present

## 2017-07-13 DIAGNOSIS — K7460 Unspecified cirrhosis of liver: Secondary | ICD-10-CM | POA: Diagnosis not present

## 2017-07-13 DIAGNOSIS — K922 Gastrointestinal hemorrhage, unspecified: Secondary | ICD-10-CM | POA: Diagnosis present

## 2017-07-13 DIAGNOSIS — N189 Chronic kidney disease, unspecified: Secondary | ICD-10-CM | POA: Insufficient documentation

## 2017-07-13 DIAGNOSIS — Z885 Allergy status to narcotic agent status: Secondary | ICD-10-CM | POA: Diagnosis not present

## 2017-07-13 DIAGNOSIS — Z888 Allergy status to other drugs, medicaments and biological substances status: Secondary | ICD-10-CM | POA: Diagnosis not present

## 2017-07-13 DIAGNOSIS — F329 Major depressive disorder, single episode, unspecified: Secondary | ICD-10-CM | POA: Insufficient documentation

## 2017-07-13 DIAGNOSIS — J439 Emphysema, unspecified: Secondary | ICD-10-CM | POA: Insufficient documentation

## 2017-07-13 DIAGNOSIS — K219 Gastro-esophageal reflux disease without esophagitis: Secondary | ICD-10-CM | POA: Diagnosis not present

## 2017-07-13 DIAGNOSIS — Z880 Allergy status to penicillin: Secondary | ICD-10-CM | POA: Insufficient documentation

## 2017-07-13 DIAGNOSIS — K625 Hemorrhage of anus and rectum: Secondary | ICD-10-CM | POA: Diagnosis not present

## 2017-07-13 DIAGNOSIS — Z881 Allergy status to other antibiotic agents status: Secondary | ICD-10-CM | POA: Insufficient documentation

## 2017-07-13 DIAGNOSIS — I8500 Esophageal varices without bleeding: Secondary | ICD-10-CM | POA: Insufficient documentation

## 2017-07-13 DIAGNOSIS — Z79899 Other long term (current) drug therapy: Secondary | ICD-10-CM | POA: Insufficient documentation

## 2017-07-13 DIAGNOSIS — K766 Portal hypertension: Secondary | ICD-10-CM | POA: Insufficient documentation

## 2017-07-13 LAB — CBC
HCT: 36.2 % (ref 35.0–47.0)
Hemoglobin: 11.6 g/dL — ABNORMAL LOW (ref 12.0–16.0)
MCH: 27.7 pg (ref 26.0–34.0)
MCHC: 32.2 g/dL (ref 32.0–36.0)
MCV: 85.9 fL (ref 80.0–100.0)
PLATELETS: 193 10*3/uL (ref 150–440)
RBC: 4.21 MIL/uL (ref 3.80–5.20)
RDW: 16.5 % — AB (ref 11.5–14.5)
WBC: 4.9 10*3/uL (ref 3.6–11.0)

## 2017-07-13 LAB — COMPREHENSIVE METABOLIC PANEL
ALBUMIN: 3 g/dL — AB (ref 3.5–5.0)
ALK PHOS: 98 U/L (ref 38–126)
ALT: 25 U/L (ref 14–54)
AST: 37 U/L (ref 15–41)
Anion gap: 9 (ref 5–15)
BUN: 8 mg/dL (ref 6–20)
CALCIUM: 8.5 mg/dL — AB (ref 8.9–10.3)
CHLORIDE: 104 mmol/L (ref 101–111)
CO2: 24 mmol/L (ref 22–32)
CREATININE: 0.93 mg/dL (ref 0.44–1.00)
GFR calc non Af Amer: >60 mL/min (ref >60)
GLUCOSE: 120 mg/dL — AB (ref 65–99)
Potassium: 3.2 mmol/L — ABNORMAL LOW (ref 3.5–5.1)
SODIUM: 137 mmol/L (ref 135–145)
Total Bilirubin: 0.8 mg/dL (ref 0.3–1.2)
Total Protein: 7.7 g/dL (ref 6.5–8.1)

## 2017-07-13 LAB — TROPONIN I

## 2017-07-13 NOTE — ED Notes (Signed)
Pt assisted to bathroom after arrival; wearing attends-blood noted in back of attend;

## 2017-07-13 NOTE — ED Notes (Addendum)
Pt reports rectal bleeding while in the bed tonight.   Sx began 1 hour ago.  Pt reports right side abd pain   No vomiting or diarrhea.  No dizziness.  Pt has a pad on with bright red bleeding noted.  Iv started and labs sent.

## 2017-07-13 NOTE — ED Triage Notes (Signed)
EMS pt from home; says she used the bathroom and noticed dark blood in the toilet, like coffee; pt says she voided and had a bowel movement; not sure where she's bleeding from, rectum or bladder; pt arrives awake and alert; talking in complete coherent sentences

## 2017-07-13 NOTE — ED Notes (Signed)
Patient transported to X-ray via stretcher 

## 2017-07-13 NOTE — ED Provider Notes (Signed)
[facility] Emergency Department Provider Note   ____________________________________________   First MD Initiated Contact with Patient 07/13/17 2304     (approximate)  I have reviewed the triage vital signs and the nursing notes.   HISTORY  Chief Complaint Hematuria and Rectal Bleeding    HPI [name] is a 62 y.o. female brought to the ED from home via EMS with a chief complaint of rectal bleeding.  Patient has a complex medical history including chronic pancreatitis, liver disease, rectal vaginal fistula, alcohol abuse, subdural hematoma status post craniectomy who reports passing dark red blood from her rectum this evening.  Also complains of generalized abdominal pain and nausea.  Notes her urine looks cloudy.  Denies associated fever, chills, chest pain, shortness of breath, diarrhea.  Denies recent travel or trauma.   Past Medical History:  Diagnosis Date  . Allergy   . Anemia   . Anxiety   . CHF (congestive heart failure) ([place])   . Chronic bronchitis ([place])   . Chronic kidney disease   . Cirrhosis ([place])   . Depression   . Diabetes mellitus without complication ([place])   . Diverticulitis   . Emphysema of lung ([place])   . GERD (gastroesophageal reflux disease)   . H/O drug abuse    Clean since 2009  . H/O ETOH abuse    Sober since 2009  . Hepatitis C   . History of sepsis 2016  . Hypertension   . Malignant brain tumor ([place]) 2007  . Migraines   . Pancreatic ascites   . Pancreatitis, alcoholic 2035  . Rectovaginal fistula   . Seizures [facility])    Last seizure October 2017  . Spleen enlarged   . Stroke [facility]) 2007, 2008   during brain surgery  . Urine incontinence     Patient Active Problem List   Diagnosis Date Noted  . Vaginal candida 12/11/2016  . 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 Infection 02/26/2016  . Acute intractable headache 02/24/2016  . Acute headache 02/24/2016  . Osteomyelitis of skull ([place])   . Encephalopathy   .  Status post craniectomy 12/28/2015  . Wound infection after surgery 12/26/2015  . UTI (lower urinary tract infection) 10/22/2015  . Chest pain on breathing   . SOB (shortness of breath)   . Wheezing   . Subdural hematoma ([place]) 08/16/2015  . GI bleed 06/24/2015  . Pyelonephritis 06/24/2015  . Sepsis ([place]) 06/22/2015  . Orthostatic hypotension 05/15/2015  . Alcohol-induced acute pancreatitis 03/13/2015  . Abdominal pain 02/08/2015  . Chronic pain syndrome 02/08/2015  . Closed fracture of coccyx ([place]) 02/02/2015  . COPD (chronic obstructive pulmonary disease) ([place]) 02/02/2015  . Primary insomnia 02/02/2015  . Gastro-esophageal reflux disease with esophagitis 02/02/2015  . Stress incontinence in female 02/02/2015  . ALC (alcoholic liver cirrhosis) ([place]) 12/23/2014  . Clinical depression 12/23/2014  . Anxiety 12/23/2014  . Diabetes mellitus, type 2 ([place]) 12/23/2014  . HTN (hypertension) 11/05/2014  . Liver cirrhosis ([place]) 07/19/2014  . Liver failure ([place]) 02/18/2013  . 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 Chronic pancreatitis ([place]) 11/03/2012  . Seizure disorder ([place]) 11/03/2012  . Benign meningioma of brain ([place]) 11/03/2012  . Chronic pelvic pain in female 11/03/2012  . Hepatitis C virus infection without hepatic coma 11/03/2012  . UTI (urinary tract infection) 11/03/2012  . Alcohol abuse, unspecified 11/03/2012  . Chronic liver disease 11/03/2012  . Schizophrenia ([place]) 11/03/2012  . Narcotic abuse ([place]) 11/03/2012  . Benign neoplasm of cerebral meninges (  [place]) 11/03/2012  . Breast screening 11/03/2012  . 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 Nondependent alcohol abuse 11/03/2012  . 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 Female genital symptoms 11/03/2012    Past Surgical History:  Procedure Laterality Date  . ABDOMINAL HYSTERECTOMY  1979  . APPENDECTOMY  1999  . BRAIN SURGERY  2007   malignant brain tumor  .  CHOLECYSTECTOMY  2001  . COLON SURGERY  2006  . CRANIOPLASTY N/A 09/15/2015   Procedure: Cranioplasty with retrieval of bone flap from abdominal pocket;  Surgeon: Ashok Pall, MD;  Location: [place] NEURO ORS;  Service: Neurosurgery;  Laterality: N/A;  Cranioplasty with retrieval of bone flap from abdominal pocket  . CRANIOTOMY Right 08/16/2015   Procedure: Right Fronto-Temporal-Parietal Craniotomy for Evacuation of Hematoma with Bone Flap Placement in Abdomen;  Surgeon: Ashok Pall, MD;  Location: [place] NEURO ORS;  Service: Neurosurgery;  Laterality: Right;  . CRANIOTOMY N/A 12/28/2015   Procedure: Craniectomy for wound debridement;  Surgeon: Ashok Pall, MD;  Location: [place] NEURO ORS;  Service: Neurosurgery;  Laterality: N/A;  Craniectomy for wound debridement  . DILATION AND CURETTAGE OF UTERUS    . EYE SURGERY    . JOINT REPLACEMENT Left    TOTAL KNEE REPLACEMENT  . rectovaginal fistula repair w/ colostomy  2011  . REPLACEMENT TOTAL KNEE Left 2005    Prior to Admission medications   Medication Sig Start Date End Date Taking? Authorizing Provider  albuterol (PROVENTIL HFA;VENTOLIN HFA) 108 (90 Base) MCG/ACT inhaler Inhale 2 puffs into the lungs every 6 (six) hours as needed for wheezing or shortness of breath.    [provider]  albuterol (PROVENTIL) (5 MG/ML) 0.5% nebulizer solution Take 2.5 mg by nebulization every 6 (six) hours as needed for wheezing or shortness of breath.     [provider]  ALPRAZolam Duanne Moron) 1 MG tablet Take 1 tablet (1 mg total) by mouth 2 (two) times daily as needed for anxiety. Patient taking differently: Take 1 mg by mouth 3 (three) times daily.  03/14/16   Rubie Maid, MD  amitriptyline (ELAVIL) 50 MG tablet Take 50 mg by mouth at bedtime.    [provider]  amLODipine (NORVASC) 10 MG tablet Take 10 mg by mouth daily.    [provider]  Blood Glucose Monitoring Suppl (FIFTY50 GLUCOSE METER 2.0) w/Device KIT ICD-10 E11.9 Check blood  sugars once daily 09/04/15   [provider]  brompheniramine-pseudoephedrine-DM 30-2-10 MG/5ML syrup Take 5 mLs by mouth 4 (four) times daily as needed. 06/02/16   Sable Feil, PA-C  cyproheptadine (PERIACTIN) 4 MG tablet Take 1 tablet (4 mg total) by mouth 3 (three) times daily as needed for allergies. 06/02/16   Sable Feil, PA-C  diphenhydrAMINE (BENADRYL) 25 mg capsule Take 1 capsule (25 mg total) by mouth every 8 (eight) hours as needed for itching. 10/25/15   Vaughan Basta, MD  Fluticasone-Salmeterol (ADVAIR) 250-50 MCG/DOSE AEPB Inhale 1 puff into the lungs 2 (two) times daily.     [provider]  furosemide (LASIX) 20 MG tablet Take 20 mg by mouth every morning.     [provider]  gabapentin (NEURONTIN) 300 MG capsule Take 600 mg by mouth 4 (four) times daily.  12/11/15   [provider]  hydrOXYzine (ATARAX/VISTARIL) 25 MG tablet Take 1 tablet by mouth  every 6 hours as needed 03/08/16   [provider]  Ipratropium-Albuterol (COMBIVENT) 20-100 MCG/ACT AERS respimat Inhale  2 puffs into the lungs every 6 (six) hours as needed for wheezing or shortness of breath.     [provider]  levETIRAcetam (KEPPRA) 500 MG tablet Take 500 mg by mouth 2 (two) times daily.  10/18/15   [provider]  lipase/protease/amylase (CREON) 12000 units CPEP capsule Take 24,000 Units by mouth 3 (three) times daily with meals.    [provider]  mometasone-formoterol (DULERA) 200-5 MCG/ACT AERO Inhale 2 puffs into the lungs 2 (two) times daily.    [provider]  omeprazole (PRILOSEC) 20 MG capsule Take 20 mg by mouth every morning.  11/29/15   [provider]  Oxycodone HCl 20 MG TABS Take 20 mg by mouth 3 (three) times daily as needed (pain).    [provider]  phenytoin (DILANTIN) 100 MG/4ML suspension Take 300 mg by mouth 2 (two) times daily. Takes 88m by mouth bid    [provider]    pravastatin (PRAVACHOL) 10 MG tablet Take 10 mg by mouth daily.    [provider]  prochlorperazine (COMPAZINE) 10 MG tablet Take 10 mg by mouth every 12 (twelve) hours as needed for nausea or vomiting.    [provider]  promethazine (PHENERGAN) 25 MG tablet Take 25 mg by mouth every 8 (eight) hours as needed for nausea or vomiting.    [provider]  sertraline (ZOLOFT) 100 MG tablet Take 200 mg by mouth daily.    [provider]  spironolactone (ALDACTONE) 50 MG tablet Take 1 tablet (50 mg total) by mouth 2 (two) times daily. 10/25/15   VVaughan Basta MD  sucralfate (CARAFATE) 1 g tablet Take 1 g by mouth 4 (four) times daily.    [provider]  tiotropium (SPIRIVA) 18 MCG inhalation capsule Place 18 mcg into inhaler and inhale daily as needed (shortness of breath).     [provider]  traMADol (ULTRAM) 50 MG tablet take 1 tablet by mouth once daily for severe pain 02/14/16   [provider]  traMADol (ULTRAM) 50 MG tablet Take 1 tablet (50 mg total) by mouth every 6 (six) hours as needed for moderate pain. 06/02/16   SSable Feil PA-C  triamcinolone (KENALOG) 0.025 % ointment Apply 1 application topically 2 (two) times daily. 03/17/16   CRubie Maid MD  triamcinolone cream (KENALOG) 0.1 % apply to affected area twice a day 02/07/16   [provider]  zolpidem (AMBIEN) 5 MG tablet Take 1 tablet (5 mg total) by mouth at bedtime as needed for sleep. 02/28/16   MBettey Costa MD    Allergies Ibuprofen; Sulfa antibiotics; Hydroxyzine; Amoxicillin; Chocolate; Chocolate flavor; Ciprofloxacin; Dilaudid [hydromorphone hcl]; Fentanyl; Hydromorphone; Hydromorphone hcl; Metformin; Penicillins; Strawberry extract; Zofran [ondansetron hcl]; and Zofran [ondansetron]  Family History  Problem Relation Age of Onset  . Hypertension Mother   . Heart disease Father   . Diabetes Father   . Cancer Sister        brain  . Cancer  Grandchild 8       brain tumor    Social History Social History   Tobacco Use  . Smoking status: Current Every Day Smoker    Packs/day: 1.00    Types: Cigarettes  . Smokeless tobacco: Never Used  Substance Use Topics  . Alcohol use: Yes    Alcohol/week: 2.4 oz    Types: 4 Cans of beer per week  . Drug use: Yes    Types: Cocaine    Review of Systems  Constitutional: No fever/chills. Eyes: No visual changes. ENT: No sore throat. Cardiovascular: Denies chest pain. Respiratory: Denies shortness of breath. Gastrointestinal: Positive for abdominal pain and nausea, no vomiting.  No diarrhea.  No constipation. Genitourinary: Negative for dysuria. Musculoskeletal: Negative for back pain. Skin: Negative for rash. Neurological: Negative for headaches, focal weakness or numbness.   ____________________________________________   PHYSICAL EXAM:  VITAL SIGNS: ED Triage Vitals  Enc Vitals Group     BP 07/13/17 2220 120/77     Pulse Rate 07/13/17 2220 93     Resp 07/13/17 2220 18     Temp 07/13/17 2220 97.9 F (36.6 C)     Temp Source 07/13/17 2220 Oral     SpO2 07/13/17 2220 98 %     Weight 07/13/17 2228 270 lb (122.5 kg)     Height 07/13/17 2228 _0  (1.702 m)     Head Circumference --      Peak Flow --      Pain Score 07/13/17 2228 10     Pain Loc --      Pain Edu? --      Excl. in [place]? --     Constitutional: Alert and oriented.  Chronically ill appearing and in no acute distress. Eyes: Conjunctivae are normal. PERRL. EOMI. Head: Atraumatic. Nose: No congestion/rhinnorhea. Mouth/Throat: Mucous membranes are moist.  Oropharynx non-erythematous. Neck: No stridor.   Cardiovascular: Normal rate, regular rhythm. Grossly normal heart sounds.  Good peripheral circulation. Respiratory: Normal respiratory effort.  No retractions. Lungs CTAB. Gastrointestinal: Moderately obese.  Soft and mildly diffuse tender to palpation without rebound or guarding. No distention. No  abdominal bruits. No CVA tenderness. Musculoskeletal: No lower extremity tenderness nor edema.  No joint effusions. Neurologic:  Normal speech and language. No gross focal neurologic deficits are appreciated.  Skin:  Skin is warm, dry and intact. No rash noted. Psychiatric: Mood and affect are normal. Speech and behavior are normal.  ____________________________________________   LABS (all labs ordered are listed, but only abnormal results are displayed)  Labs Reviewed  COMPREHENSIVE METABOLIC PANEL - Abnormal; Notable for the following components:      Result Value   Potassium 3.2 (*)    Glucose, Bld 120 (*)    Calcium 8.5 (*)    Albumin 3.0 (*)    All other components within normal limits  CBC - Abnormal; Notable for the following components:   Hemoglobin 11.6 (*)    RDW 16.5 (*)    All other components within normal limits  PROTIME-INR - Abnormal; Notable for the following components:   Prothrombin Time 17.2 (*)    All other components within normal limits  LIPASE, BLOOD - Abnormal; Notable for the following components:   Lipase 150 (*)    All other components within normal limits  TROPONIN I  URINALYSIS, COMPLETE (UACMP) WITH MICROSCOPIC  POC OCCULT BLOOD, ED  TYPE AND SCREEN   ____________________________________________  EKG  ED ECG REPORT I, Deshayla Empson J, the attending physician, personally viewed and interpreted this ECG.   Date: 07/14/2017  EKG Time: 2234  Rate: 93  Rhythm: normal EKG, normal sinus rhythm  Axis: Normal  Intervals:none  ST&T Change: Nonspecific  ____________________________________________  RADIOLOGY  Dg Chest 2 View  Result Date: 07/13/2017 CLINICAL DATA:  Rectal bleeding EXAM: CHEST  2 VIEW COMPARISON:  None. FINDINGS: Normal heart size. Slight uncoiling of the thoracic aorta with aortic atherosclerosis. Mild upper lobe emphysematous hyperinflation with crowding of lower lobe interstitial lung markings. Minimal subsegmental atelectasis  at the right lung base overlying the costophrenic angle. No pneumonic consolidation or CHF. No effusion or pneumothorax. Mild degenerative change along the dorsal spine without acute osseous abnormality. IMPRESSION: Upper lobe predominant emphysematous hyperinflation with crowding of lower lobe interstitial lung markings. No pneumonic consolidation or effusion. No CHF. Electronically Signed   By: Ashley Royalty M.D.   On: 07/13/2017 23:39   Ct Abdomen Pelvis W Contrast  Result Date: 07/14/2017 CLINICAL DATA:  62 y/o F; history of appendectomy, cholecystectomy, and colostomy with reversal. Blood in stool or urine. EXAM: CT ABDOMEN AND PELVIS WITH CONTRAST TECHNIQUE: Multidetector CT imaging of the abdomen and pelvis was performed using the standard protocol following bolus administration of intravenous contrast. CONTRAST:  168m ISOVUE-300 IOPAMIDOL (ISOVUE-300) INJECTION 61% COMPARISON:  03/21/2016 and 08/30/2014 CT abdomen and pelvis. FINDINGS: Lower chest: No acute abnormality. Hepatobiliary: Cirrhotic liver. No focal liver abnormality is seen. Status post cholecystectomy. No biliary dilatation. Pancreas: Unremarkable. No pancreatic ductal dilatation or surrounding inflammatory changes. Spleen: Spleen measuring 16.4 x 5.5 x 14.0 cm (volume = 660 cm^3). Adrenals/Urinary Tract: Stable 16 mm nodule within the left adrenal gland (series 2, image 27) from 2016, likely adenoma. Stomach/Bowel: Stomach is within normal limits. Widely patent colorectal anastomosis. No evidence of bowel wall thickening, distention, or inflammatory changes. Vascular/Lymphatic: Aortic atherosclerosis. No enlarged abdominal or pelvic lymph nodes. Reproductive: Status post hysterectomy. No adnexal masses. Other: No abdominal wall hernia or abnormality. No abdominopelvic ascites. Musculoskeletal: No acute or significant osseous findings. IMPRESSION: 1. No acute process identified. 2. Cirrhotic liver. 3. Splenomegaly, volume 660 cc. 4. Stable  left adrenal nodule from 2016, likely adenoma. 5. Aortic atherosclerosis. Electronically Signed   By: LKristine GarbeM.D.   On: 07/14/2017 02:27    ____________________________________________   PROCEDURES  Procedure(s) performed: None  Procedures  Critical Care performed: No  ____________________________________________   INITIAL IMPRESSION / ASSESSMENT AND PLAN / ED COURSE  As part of my medical decision making, I reviewed the following data within the [place] reviewed and incorporated, Labs reviewed, EKG interpreted, Old chart reviewed, Radiograph reviewed and Notes from prior ED visits.   62year old female with a complex medical history including chronic pancreatitis, alcohol abuse, chronic abdominal pain, history of rectal vesicular fistula who presents with rectal bleeding and abdominal pain. Differential diagnosis includes, but is not limited to, biliary disease (biliary colic, acute cholecystitis, cholangitis, choledocholithiasis, etc), intrathoracic causes for epigastric abdominal pain including ACS, gastritis, duodenitis, pancreatitis, small bowel or large bowel obstruction, abdominal aortic aneurysm, hernia, and gastritis.  Unable to perform digital rectal exam secondary to patient's large body habitus.  However, she is wearing a pull-up diaper which is saturated with rectal blood.  Laboratory results are unremarkable thus far.  Will check lipase.  Will administer IV analgesia with antiemetic, and obtain CT abdomen/pelvis to evaluate for intra-abdominal etiology.  Anticipate hospitalization for GI bleeding.  Clinical Course as of Jul 15 235  Mon Jul 14, 2017  0210 Patient returns from CT scan.  Still passing clots from rectum.  Requesting additional morphine.  [JS]  01914Updated patient of CT results.  Will discuss with hospitalist to evaluate patient in the emergency department for admission for likely lower GI bleed.  [JS]      Clinical Course User Index [JS] SPaulette Blanch MD     ____________________________________________   FINAL CLINICAL IMPRESSION(S) / ED DIAGNOSES  Final diagnoses:  Rectal bleeding  Generalized abdominal pain  Lower GI bleed  Chronic  pancreatitis, unspecified pancreatitis type [facility])     ED Discharge Orders    None       Note:  This document was prepared using Dragon voice recognition software and may include unintentional dictation errors.    Paulette Blanch, MD 07/14/17 [phone]

## 2017-07-14 ENCOUNTER — Other Ambulatory Visit: Payer: Self-pay

## 2017-07-14 ENCOUNTER — Encounter: Payer: Self-pay | Admitting: Radiology

## 2017-07-14 ENCOUNTER — Emergency Department: Payer: Medicare Other

## 2017-07-14 DIAGNOSIS — K625 Hemorrhage of anus and rectum: Secondary | ICD-10-CM | POA: Diagnosis not present

## 2017-07-14 LAB — URINALYSIS, COMPLETE (UACMP) WITH MICROSCOPIC
BILIRUBIN URINE: NEGATIVE
GLUCOSE, UA: NEGATIVE mg/dL
Ketones, ur: NEGATIVE mg/dL
NITRITE: POSITIVE — AB
PH: 6 (ref 5.0–8.0)
Protein, ur: NEGATIVE mg/dL
SPECIFIC GRAVITY, URINE: 1.005 (ref 1.005–1.030)

## 2017-07-14 LAB — LIPASE, BLOOD: LIPASE: 150 U/L — AB (ref 11–51)

## 2017-07-14 LAB — BASIC METABOLIC PANEL
ANION GAP: 7 (ref 5–15)
BUN: 7 mg/dL (ref 6–20)
CHLORIDE: 108 mmol/L (ref 101–111)
CO2: 23 mmol/L (ref 22–32)
Calcium: 8.4 mg/dL — ABNORMAL LOW (ref 8.9–10.3)
Creatinine, Ser: 0.65 mg/dL (ref 0.44–1.00)
GFR calc non Af Amer: >60 mL/min (ref >60)
GLUCOSE: 104 mg/dL — AB (ref 65–99)
Potassium: 3.1 mmol/L — ABNORMAL LOW (ref 3.5–5.1)
Sodium: 138 mmol/L (ref 135–145)

## 2017-07-14 LAB — GLUCOSE, CAPILLARY
GLUCOSE-CAPILLARY: 93 mg/dL (ref 65–99)
Glucose-Capillary: 81 mg/dL (ref 65–99)
Glucose-Capillary: 181 mg/dL — ABNORMAL HIGH (ref 65–99)

## 2017-07-14 LAB — PROTIME-INR
INR: 1.42
Prothrombin Time: 17.2 seconds — ABNORMAL HIGH (ref 11.4–15.2)

## 2017-07-14 LAB — MAGNESIUM: Magnesium: 2 mg/dL (ref 1.7–2.4)

## 2017-07-14 LAB — HEMOGLOBIN AND HEMATOCRIT, BLOOD
HCT: 32.9 % — ABNORMAL LOW (ref 35.0–47.0)
HEMATOCRIT: 33.8 % — AB (ref 35.0–47.0)
HEMOGLOBIN: 10.9 g/dL — AB (ref 12.0–16.0)
HEMOGLOBIN: 10.9 g/dL — AB (ref 12.0–16.0)

## 2017-07-14 LAB — MRSA PCR SCREENING: MRSA by PCR: NEGATIVE

## 2017-07-14 MED ORDER — PANTOPRAZOLE SODIUM 40 MG PO TBEC
40.0000 mg | DELAYED_RELEASE_TABLET | Freq: Two times a day (BID) | ORAL | Status: DC
  Administered 2017-07-14 – 2017-07-15 (×2): 40 mg via ORAL
  Filled 2017-07-14 (×2): qty 1

## 2017-07-14 MED ORDER — MORPHINE SULFATE (PF) 4 MG/ML IV SOLN
4.0000 mg | Freq: Once | INTRAVENOUS | Status: AC
  Administered 2017-07-14: 4 mg via INTRAVENOUS

## 2017-07-14 MED ORDER — IOPAMIDOL (ISOVUE-300) INJECTION 61%
30.0000 mL | Freq: Once | INTRAVENOUS | Status: AC | PRN
  Administered 2017-07-14: 30 mL via ORAL

## 2017-07-14 MED ORDER — SODIUM CHLORIDE 0.9 % IV SOLN
8.0000 mg/h | INTRAVENOUS | Status: DC
  Administered 2017-07-14: 8 mg/h via INTRAVENOUS
  Filled 2017-07-14: qty 80

## 2017-07-14 MED ORDER — NICOTINE 14 MG/24HR TD PT24
14.0000 mg | MEDICATED_PATCH | Freq: Every day | TRANSDERMAL | Status: DC
  Administered 2017-07-14: 14 mg via TRANSDERMAL
  Filled 2017-07-14 (×2): qty 1

## 2017-07-14 MED ORDER — MOMETASONE FURO-FORMOTEROL FUM 200-5 MCG/ACT IN AERO: 2.0000 | INHALATION_SPRAY | Freq: Two times a day (BID) | RESPIRATORY_TRACT | Status: DC

## 2017-07-14 MED ORDER — MORPHINE SULFATE (PF) 4 MG/ML IV SOLN
INTRAVENOUS | Status: AC
  Filled 2017-07-14: qty 1

## 2017-07-14 MED ORDER — OXYCODONE HCL 5 MG PO TABS
10.0000 mg | ORAL_TABLET | ORAL | Status: DC | PRN
  Administered 2017-07-14 – 2017-07-15 (×4): 10 mg via ORAL
  Filled 2017-07-14 (×3): qty 2

## 2017-07-14 MED ORDER — SODIUM CHLORIDE 0.9 % IV SOLN
INTRAVENOUS | Status: DC
  Administered 2017-07-14 (×2): via INTRAVENOUS

## 2017-07-14 MED ORDER — POTASSIUM CHLORIDE CRYS ER 20 MEQ PO TBCR
60.0000 meq | EXTENDED_RELEASE_TABLET | Freq: Once | ORAL | Status: AC
  Administered 2017-07-14: 60 meq via ORAL
  Filled 2017-07-14: qty 3

## 2017-07-14 MED ORDER — DEXTROSE 5 % IV SOLN
1.0000 g | INTRAVENOUS | Status: DC
  Administered 2017-07-14: 1 g via INTRAVENOUS
  Filled 2017-07-14 (×2): qty 10

## 2017-07-14 MED ORDER — ALBUTEROL SULFATE HFA 108 (90 BASE) MCG/ACT IN AERS: 2.0000 | INHALATION_SPRAY | Freq: Four times a day (QID) | RESPIRATORY_TRACT | Status: DC | PRN

## 2017-07-14 MED ORDER — ALPRAZOLAM 1 MG PO TABS
1.0000 mg | ORAL_TABLET | Freq: Two times a day (BID) | ORAL | Status: DC | PRN
  Administered 2017-07-14: 1 mg via ORAL
  Filled 2017-07-14: qty 1

## 2017-07-14 MED ORDER — PROMETHAZINE HCL 25 MG/ML IJ SOLN
12.5000 mg | Freq: Once | INTRAMUSCULAR | Status: AC
  Administered 2017-07-14: 12.5 mg via INTRAVENOUS
  Filled 2017-07-14: qty 1

## 2017-07-14 MED ORDER — ZOLPIDEM TARTRATE 5 MG PO TABS
5.0000 mg | ORAL_TABLET | Freq: Every evening | ORAL | Status: DC | PRN
Start: 2017-07-14 — End: 2017-07-15

## 2017-07-14 MED ORDER — DOXYCYCLINE HYCLATE 100 MG IV SOLR
100.0000 mg | Freq: Two times a day (BID) | INTRAVENOUS | Status: DC
  Administered 2017-07-14: 100 mg via INTRAVENOUS
  Filled 2017-07-14 (×2): qty 100

## 2017-07-14 MED ORDER — SUCRALFATE 1 G PO TABS
1.0000 g | ORAL_TABLET | Freq: Four times a day (QID) | ORAL | Status: DC
  Administered 2017-07-14 – 2017-07-15 (×4): 1 g via ORAL
  Filled 2017-07-14 (×4): qty 1

## 2017-07-14 MED ORDER — INSULIN ASPART 100 UNIT/ML ~~LOC~~ SOLN: 0.0000 [IU] | Freq: Three times a day (TID) | SUBCUTANEOUS | Status: DC

## 2017-07-14 MED ORDER — ONDANSETRON HCL 4 MG/2ML IJ SOLN
INTRAMUSCULAR | Status: AC
  Administered 2017-07-14: 20:00:00
  Filled 2017-07-14: qty 2

## 2017-07-14 MED ORDER — ALBUTEROL SULFATE (2.5 MG/3ML) 0.083% IN NEBU: 2.5000 mg | INHALATION_SOLUTION | Freq: Four times a day (QID) | RESPIRATORY_TRACT | Status: DC | PRN

## 2017-07-14 MED ORDER — SODIUM CHLORIDE 0.9 % IV SOLN
80.0000 mg | Freq: Once | INTRAVENOUS | Status: AC
  Administered 2017-07-14: 80 mg via INTRAVENOUS
  Filled 2017-07-14: qty 80

## 2017-07-14 MED ORDER — OXYCODONE HCL 5 MG PO TABS
10.0000 mg | ORAL_TABLET | Freq: Four times a day (QID) | ORAL | Status: DC | PRN
  Administered 2017-07-14: 10 mg via ORAL
  Filled 2017-07-14 (×2): qty 2

## 2017-07-14 MED ORDER — GABAPENTIN 300 MG PO CAPS
600.0000 mg | ORAL_CAPSULE | Freq: Four times a day (QID) | ORAL | Status: DC
  Administered 2017-07-14 – 2017-07-15 (×4): 600 mg via ORAL
  Filled 2017-07-14 (×4): qty 2

## 2017-07-14 MED ORDER — PROMETHAZINE HCL 25 MG/ML IJ SOLN: 12.5000 mg | Freq: Four times a day (QID) | INTRAMUSCULAR | Status: DC | PRN

## 2017-07-14 MED ORDER — PHENYTOIN 100 MG/4ML PO SUSP
300.0000 mg | Freq: Two times a day (BID) | ORAL | Status: DC
  Administered 2017-07-14 – 2017-07-15 (×2): 300 mg via ORAL
  Filled 2017-07-14 (×9): qty 12

## 2017-07-14 MED ORDER — SODIUM CHLORIDE 0.9 % IV BOLUS (SEPSIS)
1000.0000 mL | Freq: Once | INTRAVENOUS | Status: AC
  Administered 2017-07-14: 1000 mL via INTRAVENOUS

## 2017-07-14 MED ORDER — INSULIN ASPART 100 UNIT/ML ~~LOC~~ SOLN: 0.0000 [IU] | Freq: Every day | SUBCUTANEOUS | Status: DC

## 2017-07-14 MED ORDER — MORPHINE SULFATE (PF) 4 MG/ML IV SOLN
4.0000 mg | Freq: Once | INTRAVENOUS | Status: AC
  Administered 2017-07-14: 4 mg via INTRAVENOUS
  Filled 2017-07-14: qty 1

## 2017-07-14 MED ORDER — LEVETIRACETAM 500 MG PO TABS
500.0000 mg | ORAL_TABLET | Freq: Two times a day (BID) | ORAL | Status: DC
  Administered 2017-07-14 – 2017-07-15 (×3): 500 mg via ORAL
  Filled 2017-07-14 (×3): qty 1

## 2017-07-14 MED ORDER — DIPHENHYDRAMINE HCL 25 MG PO CAPS: 25.0000 mg | ORAL_CAPSULE | Freq: Three times a day (TID) | ORAL | Status: DC | PRN

## 2017-07-14 MED ORDER — FAMOTIDINE IN NACL 20-0.9 MG/50ML-% IV SOLN
20.0000 mg | Freq: Once | INTRAVENOUS | Status: AC
  Administered 2017-07-14: 20 mg via INTRAVENOUS
  Filled 2017-07-14: qty 50

## 2017-07-14 MED ORDER — AMITRIPTYLINE HCL 25 MG PO TABS
50.0000 mg | ORAL_TABLET | Freq: Every day | ORAL | Status: DC
  Administered 2017-07-14: 50 mg via ORAL
  Filled 2017-07-14: qty 2

## 2017-07-14 MED ORDER — TRAMADOL HCL 50 MG PO TABS
50.0000 mg | ORAL_TABLET | Freq: Once | ORAL | Status: AC
  Administered 2017-07-14: 50 mg via ORAL
  Filled 2017-07-14: qty 1

## 2017-07-14 MED ORDER — MOMETASONE FURO-FORMOTEROL FUM 200-5 MCG/ACT IN AERO
2.0000 | INHALATION_SPRAY | Freq: Two times a day (BID) | RESPIRATORY_TRACT | Status: DC
  Administered 2017-07-14 – 2017-07-15 (×3): 2 via RESPIRATORY_TRACT
  Filled 2017-07-14: qty 8.8

## 2017-07-14 MED ORDER — IOPAMIDOL (ISOVUE-300) INJECTION 61%
125.0000 mL | Freq: Once | INTRAVENOUS | Status: AC | PRN
  Administered 2017-07-14: 125 mL via INTRAVENOUS

## 2017-07-14 MED ORDER — TIOTROPIUM BROMIDE MONOHYDRATE 18 MCG IN CAPS
18.0000 ug | ORAL_CAPSULE | Freq: Every day | RESPIRATORY_TRACT | Status: DC | PRN
  Filled 2017-07-14: qty 5

## 2017-07-14 NOTE — Progress Notes (Addendum)
Patient complains of abdominal pain 8 out of 10, wants medication. Vital signs reviewed.  Physical examinations is unremarkable.  GI bleeding with anemia.  Keep nothing by mouth with IV fluid support, Protonix IV, follow-up hemoglobin and GI consult. Hypokalemia.  Given potassium supplement. Tobacco abuse.  Smoking cessation was counseled for 3-4 minutes. Discussed with the patient and nurse.  Time spent about 28 minutes.

## 2017-07-14 NOTE — ED Notes (Signed)
Iv restarted in left hand.  Pt tolerated well.

## 2017-07-14 NOTE — ED Notes (Signed)
Patient transported to CT 

## 2017-07-14 NOTE — Progress Notes (Signed)
Pt requesting pain medication for abdominal pain that scores an 8 out of 10. MD notified and according to her med rec pt takes 20mg  of oxycodone every 8 hours as needed. Due to the high dose this RN and MD spoke to pharmacist who spoke to pt regular pharmacy to confirm dosage amount. After that conversation it was decided that pt would be placed on 10 mg of oxycodone every 6 hours as needed. Will place orders and continue to monitor.

## 2017-07-14 NOTE — ED Notes (Addendum)
Transport patient to floor room 204A.AS

## 2017-07-14 NOTE — H&P (Signed)
[facility] NAME: [name]    MR#:  [id number]  DATE OF BIRTH:  [birth year]  DATE OF ADMISSION:  07/13/2017  PRIMARY CARE PHYSICIAN: System, Pcp Not In   REQUESTING/REFERRING PHYSICIAN:   CHIEF COMPLAINT:   Chief Complaint  Patient presents with  . Hematuria  . Rectal Bleeding    HISTORY OF PRESENT ILLNESS: [name]  is a 62 y.o. female with a known history of congestive heart failure, chronic kidney disease, cirrhosis of liver, diabetes mellitus type 2, emphysema, GERD, hepatitis C, hypertension presented to the emergency room after having bleeding per rectum.  Patient had a couple of episodes of bright red blood per rectum.  She has abdominal discomfort which is aching in nature 4 out of 10 on a scale of 1-10 .patient had colonoscopy last at [facility] couple of months ago.  No complaints of any chest [name], shortness of breath.  Hemoglobin in the emergency room was around 11.  Hospitalist service was consulted.  No hematemesis or hemoptysis.  PAST MEDICAL HISTORY:   Past Medical History:  Diagnosis Date  . Allergy   . Anemia   . Anxiety   . CHF (congestive heart failure) ([place])   . Chronic bronchitis ([place])   . Chronic kidney disease   . Cirrhosis ([place])   . Depression   . Diabetes mellitus without complication ([place])   . Diverticulitis   . Emphysema of lung ([place])   . GERD (gastroesophageal reflux disease)   . H/O drug abuse    Clean since 2009  . H/O ETOH abuse    Sober since 2009  . Hepatitis C   . History of sepsis 2016  . Hypertension   . Malignant brain tumor ([place]) 2007  . Migraines   . Pancreatic ascites   . Pancreatitis, alcoholic 6945  . Rectovaginal fistula   . Seizures [facility])    Last seizure October 2017  . Spleen enlarged   . Stroke [facility]) 2007, 2008   during brain surgery  . Urine incontinence     PAST SURGICAL HISTORY:  Past Surgical History:  Procedure Laterality Date  . ABDOMINAL HYSTERECTOMY   1979  . APPENDECTOMY  1999  . BRAIN SURGERY  2007   malignant brain tumor  . CHOLECYSTECTOMY  2001  . COLON SURGERY  2006  . CRANIOPLASTY N/A 09/15/2015   Procedure: Cranioplasty with retrieval of bone flap from abdominal pocket;  Surgeon: Ashok Pall, MD;  Location: [place] NEURO ORS;  Service: Neurosurgery;  Laterality: N/A;  Cranioplasty with retrieval of bone flap from abdominal pocket  . CRANIOTOMY Right 08/16/2015   Procedure: Right Fronto-Temporal-Parietal Craniotomy for Evacuation of Hematoma with Bone Flap Placement in Abdomen;  Surgeon: Ashok Pall, MD;  Location: [place] NEURO ORS;  Service: Neurosurgery;  Laterality: Right;  . CRANIOTOMY N/A 12/28/2015   Procedure: Craniectomy for wound debridement;  Surgeon: Ashok Pall, MD;  Location: [place] NEURO ORS;  Service: Neurosurgery;  Laterality: N/A;  Craniectomy for wound debridement  . DILATION AND CURETTAGE OF UTERUS    . EYE SURGERY    . JOINT REPLACEMENT Left    TOTAL KNEE REPLACEMENT  . rectovaginal fistula repair w/ colostomy  2011  . REPLACEMENT TOTAL KNEE Left 2005    SOCIAL HISTORY:  Social History   Tobacco Use  . Smoking status: Current Every Day Smoker    Packs/day: 1.00    Types: Cigarettes  . Smokeless tobacco: Never Used  Substance Use Topics  .  Alcohol use: Yes    Alcohol/week: 2.4 oz    Types: 4 Cans of beer per week    FAMILY HISTORY:  Family History  Problem Relation Age of Onset  . Hypertension Mother   . Heart disease Father   . Diabetes Father   . Cancer Sister        brain  . Cancer Grandchild 8       brain tumor    DRUG ALLERGIES:  Allergies  Allergen Reactions  . Ibuprofen Other (See Comments)    Pt states that she is unable to take this medication because of her liver.    . Sulfa Antibiotics Hives  . Hydroxyzine Other (See Comments)    Causes crying   . Amoxicillin Itching, Rash and Other (See Comments)    Has patient had a PCN reaction causing immediate rash, facial/tongue/throat swelling,  SOB or lightheadedness with hypotension: No Has patient had a PCN reaction causing severe rash involving mucus membranes or skin necrosis: No Has patient had a PCN reaction that required hospitalization No Has patient had a PCN reaction occurring within the last 10 years: Yes If all of the above answers are "NO", then may proceed with Cephalosporin use.  . Chocolate Rash  . Chocolate Flavor Rash  . Ciprofloxacin Itching and Rash  . Dilaudid [Hydromorphone Hcl] Itching  . Fentanyl Rash  . Hydromorphone Itching  . Hydromorphone Hcl Itching  . Metformin Nausea And Vomiting  . Penicillins Itching, Rash and Other (See Comments)    Has patient had a PCN reaction causing immediate rash, facial/tongue/throat swelling, SOB or lightheadedness with hypotension: No Has patient had a PCN reaction causing severe rash involving mucus membranes or skin necrosis: No Has patient had a PCN reaction that required hospitalization No Has patient had a PCN reaction occurring within the last 10 years: Yes If all of the above answers are "NO", then may proceed with Cephalosporin use.  . Strawberry Extract Rash  . Zofran [Ondansetron Hcl] Itching and Nausea And Vomiting  . Zofran [Ondansetron] Itching    REVIEW OF SYSTEMS:   CONSTITUTIONAL: No fever, fatigue or weakness.  EYES: No blurred or double vision.  EARS, NOSE, AND THROAT: No tinnitus or ear [name].  RESPIRATORY: No cough, shortness of breath, wheezing or hemoptysis.  CARDIOVASCULAR: No chest [name], orthopnea, edema.  GASTROINTESTINAL: No nausea, vomiting, diarrhea  Has abdominal [name].  Has rectal bleed. GENITOURINARY: Has dysuria,  No hematuria.  ENDOCRINE: No polyuria, nocturia,  HEMATOLOGY: No anemia, easy bruising or bleeding SKIN: No rash or lesion. MUSCULOSKELETAL: No joint [name] or arthritis.   NEUROLOGIC: No tingling, numbness, weakness.  PSYCHIATRY: No anxiety or depression.   MEDICATIONS AT HOME:  Prior to Admission medications    Medication Sig Start Date End Date Taking? Authorizing Provider  albuterol (PROVENTIL HFA;VENTOLIN HFA) 108 (90 Base) MCG/ACT inhaler Inhale 2 puffs into the lungs every 6 (six) hours as needed for wheezing or shortness of breath.   Yes [provider]  albuterol (PROVENTIL) (5 MG/ML) 0.5% nebulizer solution Take 2.5 mg by nebulization every 6 (six) hours as needed for wheezing or shortness of breath.    Yes [provider]  ALPRAZolam Duanne Moron) 1 MG tablet Take 1 tablet (1 mg total) by mouth 2 (two) times daily as needed for anxiety. Patient taking differently: Take 1 mg by mouth at bedtime as needed.  03/14/16  Yes Rubie Maid, MD  amitriptyline (ELAVIL) 50 MG tablet Take 50 mg by mouth at bedtime.  Yes [provider]  amLODipine (NORVASC) 10 MG tablet Take 10 mg by mouth daily.   Yes [provider]  diphenhydrAMINE (BENADRYL) 25 mg capsule Take 1 capsule (25 mg total) by mouth every 8 (eight) hours as needed for itching. 10/25/15  Yes Vaughan Basta, MD  Fluticasone-Salmeterol (ADVAIR) 250-50 MCG/DOSE AEPB Inhale 1 puff into the lungs 2 (two) times daily.    Yes [provider]  gabapentin (NEURONTIN) 300 MG capsule Take 600 mg by mouth 4 (four) times daily.  12/11/15  Yes [provider]  Ipratropium-Albuterol (COMBIVENT) 20-100 MCG/ACT AERS respimat Inhale 2 puffs into the lungs every 6 (six) hours as needed for wheezing or shortness of breath.    Yes [provider]  levETIRAcetam (KEPPRA) 500 MG tablet Take 500 mg by mouth 2 (two) times daily.  10/18/15  Yes [provider]  lipase/protease/amylase (CREON) 12000 units CPEP capsule Take 24,000 Units by mouth 3 (three) times daily with meals.   Yes [provider]  Oxycodone HCl 20 MG TABS Take 20 mg by mouth 3 (three) times daily as needed ([name]).   Yes [provider]  phenytoin (DILANTIN) 100 MG/4ML suspension Take 300 mg by mouth 2 (two) times  daily. Takes 33m by mouth bid   Yes [provider]  pravastatin (PRAVACHOL) 10 MG tablet Take 10 mg by mouth daily.   Yes [provider]  prochlorperazine (COMPAZINE) 10 MG tablet Take 10 mg by mouth every 12 (twelve) hours as needed for nausea or vomiting.   Yes [provider]  promethazine (PHENERGAN) 25 MG tablet Take 25 mg by mouth every 8 (eight) hours as needed for nausea or vomiting.   Yes [provider]  spironolactone (ALDACTONE) 50 MG tablet Take 1 tablet (50 mg total) by mouth 2 (two) times daily. 10/25/15  Yes VVaughan Basta MD  sucralfate (CARAFATE) 1 g tablet Take 1 g by mouth 4 (four) times daily.   Yes [provider]  tiotropium (SPIRIVA) 18 MCG inhalation capsule Place 18 mcg into inhaler and inhale daily as needed (shortness of breath).    Yes [provider]  triamcinolone cream (KENALOG) 0.1 % apply to affected area twice a day 02/07/16  Yes [provider]  zolpidem (AMBIEN) 5 MG tablet Take 1 tablet (5 mg total) by mouth at bedtime as needed for sleep. 02/28/16  Yes MBettey Costa MD  Blood Glucose Monitoring Suppl (FIFTY50 GLUCOSE METER 2.0) w/Device KIT ICD-10 E11.9 Check blood sugars once daily 09/04/15   [provider]  brompheniramine-pseudoephedrine-DM 30-2-10 MG/5ML syrup Take 5 mLs by mouth 4 (four) times daily as needed. Patient not taking: Reported on 07/14/2017 06/02/16   SSable Feil PA-C  cyproheptadine (PERIACTIN) 4 MG tablet Take 1 tablet (4 mg total) by mouth 3 (three) times daily as needed for allergies. Patient not taking: Reported on 07/14/2017 06/02/16   SSable Feil PA-C  furosemide (LASIX) 20 MG tablet Take 20 mg by mouth every morning.     [provider]  hydrOXYzine (ATARAX/VISTARIL) 25 MG tablet Take 1 tablet by mouth  every 6 hours as needed 03/08/16   [provider]  mometasone-formoterol (DULERA) 200-5 MCG/ACT AERO Inhale 2 puffs into the lungs 2  (two) times daily.    [provider]  omeprazole (PRILOSEC) 20 MG capsule Take 20 mg by mouth every morning.  11/29/15   [provider]  sertraline (ZOLOFT) 100 MG tablet Take 200 mg by mouth daily.  [provider]  traMADol (ULTRAM) 50 MG tablet take 1 tablet by mouth once daily for severe [name] 02/14/16   [provider]  traMADol (ULTRAM) 50 MG tablet Take 1 tablet (50 mg total) by mouth every 6 (six) hours as needed for moderate [name]. Patient not taking: Reported on 07/14/2017 06/02/16   Sable Feil, PA-C  triamcinolone (KENALOG) 0.025 % ointment Apply 1 application topically 2 (two) times daily. Patient not taking: Reported on 07/14/2017 03/17/16   Rubie Maid, MD      PHYSICAL EXAMINATION:   VITAL SIGNS: Blood pressure 99/75, pulse 75, temperature 97.9 F (36.6 C), temperature source Oral, resp. rate 17, height '5\' 7"'$  (1.702 m), weight 122.5 kg (270 lb), SpO2 97 %.  GENERAL:  62 y.o.-year-old obese female patient lying in the bed with no acute distress.  EYES: Pupils equal, round, reactive to light and accommodation. No scleral icterus. Extraocular muscles intact.  HEENT: Head atraumatic, normocephalic. Oropharynx dry and nasopharynx clear.  NECK:  Supple, no jugular venous distention. No thyroid enlargement, no tenderness.  LUNGS: Normal breath sounds bilaterally, no wheezing, rales,rhonchi or crepitation. No use of accessory muscles of respiration.  CARDIOVASCULAR: S1, S2 normal. No murmurs, rubs, or gallops.  ABDOMEN: Soft, mild tenderness around umbilicus area, nondistended. Bowel sounds present. No organomegaly or mass.  Abdominal scars noted. EXTREMITIES: No pedal edema, cyanosis, or clubbing.  NEUROLOGIC: Cranial nerves II through XII are intact. Muscle strength 5/5 in all extremities. Sensation intact. Gait not checked.  PSYCHIATRIC: The patient is alert and oriented x 3.  SKIN: No obvious rash, lesion, or ulcer.   LABORATORY PANEL:    CBC Recent Labs  Lab 07/13/17 2228  WBC 4.9  HGB 11.6*  HCT 36.2  PLT 193  MCV 85.9  MCH 27.7  MCHC 32.2  RDW 16.5*   ------------------------------------------------------------------------------------------------------------------  Chemistries  Recent Labs  Lab 07/13/17 2228  NA 137  K 3.2*  CL 104  CO2 24  GLUCOSE 120*  BUN 8  CREATININE 0.93  CALCIUM 8.5*  AST 37  ALT 25  ALKPHOS 98  BILITOT 0.8   ------------------------------------------------------------------------------------------------------------------ estimated creatinine clearance is 87.3 mL/min (by C-G formula based on SCr of 0.93 mg/dL). ------------------------------------------------------------------------------------------------------------------ No results for input(s): TSH, T4TOTAL, T3FREE, THYROIDAB in the last 72 hours.  Invalid input(s): FREET3   Coagulation profile Recent Labs  Lab 07/13/17 2228  INR 1.42   ------------------------------------------------------------------------------------------------------------------- No results for input(s): DDIMER in the last 72 hours. -------------------------------------------------------------------------------------------------------------------  Cardiac Enzymes Recent Labs  Lab 07/13/17 2228  TROPONINI <0.03   ------------------------------------------------------------------------------------------------------------------ Invalid input(s): POCBNP  ---------------------------------------------------------------------------------------------------------------  Urinalysis    Component Value Date/Time   COLORURINE AMBER (A) 07/13/2017 2229   APPEARANCEUR CLOUDY (A) 07/13/2017 2229   APPEARANCEUR Hazy 10/16/2014 0605   LABSPEC 1.005 07/13/2017 2229   LABSPEC 1.006 10/16/2014 0605   PHURINE 6.0 07/13/2017 2229   GLUCOSEU NEGATIVE 07/13/2017 2229   GLUCOSEU Negative 10/16/2014 0605   HGBUR LARGE (A) 07/13/2017 2229    BILIRUBINUR NEGATIVE 07/13/2017 2229   BILIRUBINUR NEGATIVE 11/15/2015 1417   BILIRUBINUR Negative 10/16/2014 0605   KETONESUR NEGATIVE 07/13/2017 2229   PROTEINUR NEGATIVE 07/13/2017 2229   UROBILINOGEN negative 11/15/2015 1417   UROBILINOGEN 2.0 (H) 06/07/2014 1916   NITRITE POSITIVE (A) 07/13/2017 2229   LEUKOCYTESUR MODERATE (A) 07/13/2017 2229   LEUKOCYTESUR Negative 10/16/2014 0605     RADIOLOGY: Dg Chest 2 View  Result Date: 07/13/2017 CLINICAL DATA:  Rectal bleeding EXAM: CHEST  2 VIEW COMPARISON:  None. FINDINGS:  Normal heart size. Slight uncoiling of the thoracic aorta with aortic atherosclerosis. Mild upper lobe emphysematous hyperinflation with crowding of lower lobe interstitial lung markings. Minimal subsegmental atelectasis at the right lung base overlying the costophrenic angle. No pneumonic consolidation or CHF. No effusion or pneumothorax. Mild degenerative change along the dorsal spine without acute osseous abnormality. IMPRESSION: Upper lobe predominant emphysematous hyperinflation with crowding of lower lobe interstitial lung markings. No pneumonic consolidation or effusion. No CHF. Electronically Signed   By: Ashley Royalty M.D.   On: 07/13/2017 23:39   Ct Abdomen Pelvis W Contrast  Result Date: 07/14/2017 CLINICAL DATA:  62 y/o F; history of appendectomy, cholecystectomy, and colostomy with reversal. Blood in stool or urine. EXAM: CT ABDOMEN AND PELVIS WITH CONTRAST TECHNIQUE: Multidetector CT imaging of the abdomen and pelvis was performed using the standard protocol following bolus administration of intravenous contrast. CONTRAST:  124m ISOVUE-300 IOPAMIDOL (ISOVUE-300) INJECTION 61% COMPARISON:  03/21/2016 and 08/30/2014 CT abdomen and pelvis. FINDINGS: Lower chest: No acute abnormality. Hepatobiliary: Cirrhotic liver. No focal liver abnormality is seen. Status post cholecystectomy. No biliary dilatation. Pancreas: Unremarkable. No pancreatic ductal dilatation or surrounding  inflammatory changes. Spleen: Spleen measuring 16.4 x 5.5 x 14.0 cm (volume = 660 cm^3). Adrenals/Urinary Tract: Stable 16 mm nodule within the left adrenal gland (series 2, image 27) from 2016, likely adenoma. Stomach/Bowel: Stomach is within normal limits. Widely patent colorectal anastomosis. No evidence of bowel wall thickening, distention, or inflammatory changes. Vascular/Lymphatic: Aortic atherosclerosis. No enlarged abdominal or pelvic lymph nodes. Reproductive: Status post hysterectomy. No adnexal masses. Other: No abdominal wall hernia or abnormality. No abdominopelvic ascites. Musculoskeletal: No acute or significant osseous findings. IMPRESSION: 1. No acute process identified. 2. Cirrhotic liver. 3. Splenomegaly, volume 660 cc. 4. Stable left adrenal nodule from 2016, likely adenoma. 5. Aortic atherosclerosis. Electronically Signed   By: LKristine GarbeM.D.   On: 07/14/2017 02:27    EKG: Orders placed or performed during the hospital encounter of 07/13/17  . ED EKG within 10 minutes  . ED EKG within 10 minutes  . EKG 12-Lead  . EKG 12-Lead    IMPRESSION AND PLAN: 62year old female patient with history of congestive heart failure, chronic kidney disease, diabetes mellitus, cirrhosis of liver, emphysema, hepatitis C presented to the emergency room with bleeding per rectum.  Admitting diagnosis 1.  Acute gastrointestinal bleeding 2.  Urinary tract infection 3.  Cirrhosis of liver 4.  Diabetes mellitus type 2 Treatment plan Admit patient to medical floor Serial hemoglobin hematocrit monitoring Gastroenterology consultation Replace potassium IV doxycycline antibiotic IV Protonix drip  All the records are reviewed and case discussed with ED provider. Management plans discussed with the patient, family and they are in agreement.  CODE STATUS:FULL CODE Code Status History    Date Active Date Inactive Code Status Order ID Comments User Context   02/24/2016 20:03  02/26/2016 17:20 Full Code [id number] SIdelle Crouch MD Inpatient   12/26/2015 13:00 01/05/2016 21:42 Full Code [id number] CAshok Pall MD Inpatient   10/22/2015 04:26 10/25/2015 18:52 Full Code [id number] PSaundra Shelling MD ED   08/16/2015 21:36 08/30/2015 18:11 Full Code [id number] CAshok Pall MD Inpatient   06/22/2015 01:45 06/22/2015 18:09 Full Code [id number] HLytle Butte MD ED   05/16/2015 12:00 05/16/2015 18:17 DNR [id number] SMax Sane MD Inpatient   05/15/2015 01:10 05/16/2015 12:00 Full Code [id number] DHarrie Foreman MD Inpatient   11/05/2014 12:35 11/05/2014 15:55 Full Code [id number] EPenne Lash  RN Inpatient   11/05/2014 12:17 11/05/2014 12:35 Full Code [id number]  Penne Lash, RN Inpatient       TOTAL TIME TAKING CARE OF THIS PATIENT: 52 minutes.    Saundra Shelling M.D on 07/14/2017 at 3:59 AM  Between 7am to 6pm - Pager - [phone]  After 6pm go to [email or web address] - password [email or web address] [facility]  Office  [phone]  CC: Primary care physician; System, Pcp Not In

## 2017-07-14 NOTE — Progress Notes (Signed)
Pt c/o nausea and pain. Pt slightly sedated and lethargic. Phenergan ordered but due to lethargy will give zofran. Per pt she has no allergy to zofran. Will give zofran and continue to monitor.

## 2017-07-14 NOTE — ED Notes (Signed)
Pt drinking po contrast.  meds given.   

## 2017-07-14 NOTE — ED Notes (Signed)
Lab called and states pt has antibodies and it will take a while for type and screen.  Dr sung aware.

## 2017-07-14 NOTE — ED Notes (Signed)
Report off to rebecca rn 

## 2017-07-14 NOTE — Consult Note (Signed)
[name], MD [address]  [place]  [place], [place] [address]  Main: [phone]  Fax: [phone] Pager: [phone]   Consultation  Referring Provider:     No ref. provider found Primary Care Physician:  System, [place] Not In Primary Gastroenterologist:      none  Reason for Consultation:     rectal bleeding  Date of Admission:  07/13/2017 Date of Consultation:  07/14/2017         HPI:   [name] is a 62 y.o. female with multiple comorbidities as listed below, significant for cirrhosis, well compensated, secondary to hepatitis C status post SVR after treatment with harvoni, CHF presents with 1 day episode of bleeding per rectum associated with some blood clots. Patient reports her last episode was last night. She denies having any further episodes since admission. Her hemoglobin has been at baseline during this admission. She is asking for food. She denies abdominal pain, nausea, vomiting,  hematemesis, melena or constipation or diarrhea. Her most recent colonoscopy was in 2016 at [facility] which was unremarkable. She has prior history of rectovaginal fistula of unclear etiology, underwent temporary colostomy followed by colorectal anastomosis. 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 She denies any upper or lower GI symptoms otherwise. She is started on protonix drip and GI consulted for further management  NSAIDs: None  Antiplts/Anticoagulants/Anti thrombotics: none   GI Procedures:  EGD in 2014 which revealed normal esophagus and stomach, small duodenal polyp.  Diagnosis:  DUODENAL POLYP COLD BIOPSY:  - DUODENAL MUCOSA WITH BRUNNER GLAND HYPERPLASIA, ACTIVE  INFLAMMATION AND FEATURES SUGGESTIVE OF PEPTIC DUODENITIS.  - NEGATIVE FOR DYSPLASIA AND MALIGNANCY.   EGD in 11/2013  Diagnosis:  GASTRIC BIOPSY:  - OXYNTIC MUCOSA WITH MILD REACTIVE GASTROPATHY.  - NO ACTIVE INFLAMMATION, INTESTINAL METAPLASIA, OR ATROPHY.  - NO  DYSPLASIA OR MALIGNANCY.  - NO HELICOBACTER PYLORI IDENTIFIED IN HEMATOXYLIN AND EOSIN  SECTIONS.   EGD in 04/2015 at [facility] - Grade I esophageal varices.  - Bilious gastric fluid.  - Portal hypertensive gastropathy.  - A single duodenal bulb polyp. Biopsied. 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  - NEGATIVE FOR HIGH GRADE DYSPLASIA AND MALIGNANCY.   Colonoscopy at [facility] in 03/2015 Impression:- The entire examined colon is normal.  - The ileocecal valve is normal.  - No specimens collected.  - Congested mucosa in the rectum. Thinning of the   rectovaginal septum on bimanual exam but no definite   rectovaginal fistula detected.  - The rectum is normal.   Past Medical History:  Diagnosis Date  . Allergy   . Anemia   . Anxiety   . CHF (congestive heart failure) ([place])   . Chronic bronchitis ([place])   . Chronic kidney disease   . Cirrhosis ([place])   . Depression   . Diabetes mellitus without complication ([place])   . Diverticulitis   . Emphysema of lung ([place])   . GERD (gastroesophageal reflux disease)   . H/O drug abuse    Clean since 2009  . H/O ETOH abuse    Sober since 2009  . Hepatitis C   . History of sepsis 2016  . Hypertension   . Malignant brain tumor ([place]) 2007  .  Migraines   . Pancreatic ascites   . Pancreatitis, alcoholic 0174  . Rectovaginal fistula   . Seizures [facility])    Last seizure October 2017  . Spleen enlarged   . Stroke [facility]) 2007, 2008   during brain surgery  . Urine incontinence     Past Surgical History:  Procedure Laterality Date  .  ABDOMINAL HYSTERECTOMY  1979  . APPENDECTOMY  1999  . BRAIN SURGERY  2007   malignant brain tumor  . CHOLECYSTECTOMY  2001  . COLON SURGERY  2006  . CRANIOPLASTY N/A 09/15/2015   Procedure: Cranioplasty with retrieval of bone flap from abdominal pocket;  Surgeon: Ashok Pall, MD;  Location: [place] [facility]URO ORS;  Service: Neurosurgery;  Laterality: N/A;  Cranioplasty with retrieval of bone flap from abdominal pocket  . CRANIOTOMY Right 08/16/2015   Procedure: Right Fronto-Temporal-Parietal Craniotomy for Evacuation of Hematoma with Bone Flap Placement in Abdomen;  Surgeon: Ashok Pall, MD;  Location: [place] [facility]UR[facility] ORS;  Service: Neurosurgery;  Laterality: Right;  . CRANIOTOMY N/A 12/28/2015   Procedure: Craniectomy for wound debridement;  Surgeon: Ashok Pall, MD;  Location: [place] NEURO ORS;  Service: Neurosurgery;  Laterality: N/A;  Craniectomy for wound debridement  . DILATION AND CURETTAGE OF UTERUS    . EYE SURGERY    . JOINT REPLACEMENT Left    TOTAL KNEE REPLACEMENT  . rectovaginal fistula repair w/ colostomy  2011  . REPLACEMENT TOTAL KNEE Left 2005    Prior to Admission medications   Medication Sig Start Date End Date Taking? Authorizing Provider  albuterol (PROVENTIL HFA;VENTOLIN HFA) 108 (90 Base) MCG/ACT inhaler Inhale 2 puffs into the lungs every 6 (six) hours as needed for wheezing or shortness of breath.   Yes [provider]  albuterol (PROVENTIL) (5 MG/ML) 0.5% nebulizer solution Take 2.5 mg by nebulization every 6 (six) hours as needed for wheezing or shortness of breath.    Yes [provider]  ALPRAZolam Duanne Moron) 1 MG tablet Take 1 tablet (1 mg total) by mouth 2 (two) times daily as needed for anxiety. Patient taking differently: Take 1 mg by mouth at bedtime as needed.  03/14/16  Yes Rubie Maid, MD  amitriptyline (ELAVIL) 50 MG tablet Take 50 mg by mouth at bedtime.   Yes [provider]  amLODipine (NORVASC) 10 MG tablet Take 10 mg by mouth daily.   Yes  [provider]  diphenhydrAMINE (BENADRYL) 25 mg capsule Take 1 capsule (25 mg total) by mouth every 8 (eight) hours as needed for itching. 10/25/15  Yes Vaughan Basta, MD  Fluticasone-Salmeterol (ADVAIR) 250-50 MCG/DOSE AEPB Inhale 1 puff into the lungs 2 (two) times daily.    Yes [provider]  gabapentin (NEURONTIN) 300 MG capsule Take 600 mg by mouth 4 (four) times daily.  12/11/15  Yes [provider]  Ipratropium-Albuterol (COMBIVENT) 20-100 MCG/ACT AERS respimat Inhale 2 puffs into the lungs every 6 (six) hours as needed for wheezing or shortness of breath.    Yes [provider]  levETIRAcetam (KEPPRA) 500 MG tablet Take 500 mg by mouth 2 (two) times daily.  10/18/15  Yes [provider]  lipase/protease/amylase (CREON) 12000 units CPEP capsule Take 24,000 Units by mouth 3 (three) times daily with meals.   Yes [provider]  Oxycodone HCl 20 MG TABS Take 20 mg by mouth 3 (three) times daily as needed (pain).   Yes [provider]  phenytoin (DILANTIN) 100 MG/4ML suspension Take 300 mg by mouth 2 (two) times  daily. Takes 22m by mouth bid   Yes [provider]  pravastatin (PRAVACHOL) 10 MG tablet Take 10 mg by mouth daily.   Yes [provider]  prochlorperazine (COMPAZINE) 10 MG tablet Take 10 mg by mouth every 12 (twelve) hours as needed for nausea or vomiting.   Yes [provider]  promethazine (PHENERGAN) 25 MG tablet Take 25 mg by mouth every 8 (eight) hours as needed for nausea or vomiting.   Yes [provider]  spironolactone (ALDACTONE) 50 MG tablet Take 1 tablet (50 mg total) by mouth 2 (two) times daily. 10/25/15  Yes VVaughan Basta MD  sucralfate (CARAFATE) 1 g tablet Take 1 g by mouth 4 (four) times daily.   Yes [provider]  tiotropium (SPIRIVA) 18 MCG inhalation capsule Place 18 mcg into inhaler and inhale daily as needed (shortness of breath).    Yes  [provider]  triamcinolone cream (KENALOG) 0.1 % apply to affected area twice a day 02/07/16  Yes [provider]  zolpidem (AMBIEN) 5 MG tablet Take 1 tablet (5 mg total) by mouth at bedtime as needed for sleep. 02/28/16  Yes MBettey Costa MD  Blood Glucose Monitoring Suppl (FIFTY50 GLUCOSE METER 2.0) w/Device KIT ICD-10 E11.9 Check blood sugars once daily 09/04/15   [provider]  brompheniramine-pseudoephedrine-DM 30-2-10 MG/5ML syrup Take 5 mLs by mouth 4 (four) times daily as needed. Patient not taking: Reported on 07/14/2017 06/02/16   SSable Feil PA-C  cyproheptadine (PERIACTIN) 4 MG tablet Take 1 tablet (4 mg total) by mouth 3 (three) times daily as needed for allergies. Patient not taking: Reported on 07/14/2017 06/02/16   SSable Feil PA-C  furosemide (LASIX) 20 MG tablet Take 20 mg by mouth every morning.     [provider]  hydrOXYzine (ATARAX/VISTARIL) 25 MG tablet Take 1 tablet by mouth  every 6 hours as needed 03/08/16   [provider]  mometasone-formoterol (DULERA) 200-5 MCG/ACT AERO Inhale 2 puffs into the lungs 2 (two) times daily.    [provider]  omeprazole (PRILOSEC) 20 MG capsule Take 20 mg by mouth every morning.  11/29/15   [provider]  sertraline (ZOLOFT) 100 MG tablet Take 200 mg by mouth daily.    [provider]  traMADol (ULTRAM) 50 MG tablet take 1 tablet by mouth once daily for severe pain 02/14/16   [provider]  traMADol (ULTRAM) 50 MG tablet Take 1 tablet (50 mg total) by mouth every 6 (six) hours as needed for moderate pain. Patient not taking: Reported on 07/14/2017 06/02/16   SSable Feil PA-C  triamcinolone (KENALOG) 0.025 % ointment Apply 1 application topically 2 (two) times daily. Patient not taking: Reported on 07/14/2017 03/17/16   CRubie Maid MD    Family History  Problem Relation Age of Onset  . Hypertension Mother   . Heart disease Father   . Diabetes  Father   . Cancer Sister        brain  . Cancer Grandchild 8       brain tumor     Social History   Tobacco Use  . Smoking status: Current Every Day Smoker    Packs/day: 1.00    Types: Cigarettes  . Smokeless tobacco: Never Used  Substance Use Topics  . Alcohol use: Yes    Alcohol/week: 2.4 oz    Types: 4 Cans of beer per week  . Drug use: Yes    Types: Cocaine  Allergies as of 07/13/2017 - Review Complete 07/13/2017  Allergen Reaction Noted  . Ibuprofen Other (See Comments) 11/04/2014  . Sulfa antibiotics Hives 10/21/2012  . Hydroxyzine Other (See Comments) 01/20/2016  . Amoxicillin Itching, Rash, and Other (See Comments) 02/11/2014  . Chocolate Rash 11/03/2012  . Chocolate flavor Rash 02/18/2013  . Ciprofloxacin Itching and Rash 01/31/2015  . Dilaudid [hydromorphone hcl] Itching 10/21/2012  . Fentanyl Rash 11/04/2014  . Hydromorphone Itching 02/11/2014  . Hydromorphone hcl Itching 10/21/2012  . Metformin Nausea And Vomiting 06/16/2015  . Penicillins Itching, Rash, and Other (See Comments) 10/21/2012  . Strawberry extract Rash 11/03/2012  . Zofran [ondansetron hcl] Itching and Nausea And Vomiting 11/24/2013  . Zofran [ondansetron] Itching 06/22/2014    Review of Systems:    All systems reviewed and negative except where noted in HPI.   Physical Exam:  Vital signs in last 24 hours: Temp:  [97.3 F (36.3 C)-97.9 F (36.6 C)] 97.3 F (36.3 C) (01/07 1257) Pulse Rate:  [74-93] 74 (01/07 1257) Resp:  [16-26] 20 (01/07 0505) BP: (89-121)/(53-91) 89/53 (01/07 1257) SpO2:  [95 %-100 %] 96 % (01/07 1257) Weight:  [270 lb (122.5 kg)-275 lb 8 oz (125 kg)] 275 lb 8 oz (125 kg) (01/07 0518) Last BM Date: 07/13/17 General:   Pleasant, cooperative in NAD Head:  Normocephalic and atraumatic. Eyes:   No icterus.   Conjunctiva pink. PERRLA. Ears:  Normal auditory acuity. Neck:  Supple; no masses or thyroidomegaly Lungs: Respirations even and unlabored. Lungs clear to  auscultation bilaterally.   No wheezes, crackles, or rhonchi.  Heart:  Regular rate and rhythm;  Without murmur, clicks, rubs or gallops Abdomen:  Soft, nondistended, nontender, obese. Normal bowel sounds. No appreciable masses or hepatomegaly.  No rebound or guarding.  Rectal:  Not performed. Msk:  Symmetrical without gross deformities.  Strength overall weak Extremities:  Without edema, cyanosis or clubbing. Neurologic:  Alert and oriented x3;  grossly normal neurologically. Skin:  Intact without significant lesions or rashes. Cervical Nodes:  No significant cervical adenopathy. Psych:  Alert and cooperative. Normal affect.  LAB RESULTS: CBC Latest Ref Rng & Units 07/14/2017 07/13/2017 05/22/2016  WBC 3.6 - 11.0 K/uL - 4.9 5.1  Hemoglobin 12.0 - 16.0 g/dL 10.9(L) 11.6(L) 10.4(L)  Hematocrit 35.0 - 47.0 % 32.9(L) 36.2 33.9(L)  Platelets 150 - 440 K/uL - 193 141(L)    BMET BMP Latest Ref Rng & Units 07/14/2017 07/13/2017 05/22/2016  Glucose 65 - 99 mg/dL 104(H) 120(H) 84  BUN 6 - 20 mg/dL 7 8 5(L)  Creatinine 0.44 - 1.00 mg/dL 0.65 0.93 0.77  Sodium 135 - 145 mmol/L 138 137 135  Potassium 3.5 - 5.1 mmol/L 3.1(L) 3.2(L) 3.3(L)  Chloride 101 - 111 mmol/L 108 104 104  CO2 22 - 32 mmol/L _0 Calcium 8.9 - 10.3 mg/dL 8.4(L) 8.5(L) 9.0    LFT Hepatic Function Latest Ref Rng & Units 07/13/2017 03/21/2016 02/25/2016  Total Protein 6.5 - 8.1 g/dL 7.7 8.8(H) 7.7  Albumin 3.5 - 5.0 g/dL 3.0(L) 3.4(L) 2.9(L)  AST 15 - 41 U/L 37 58(H) 59(H)  ALT 14 - 54 U/L _1 Alk Phosphatase 38 - 126 U/L 98 297(H) 208(H)  Total Bilirubin 0.3 - 1.2 mg/dL 0.8 0.6 1.0     STUDIES: Dg Chest 2 View  Result Date: 07/13/2017 CLINICAL DATA:  Rectal bleeding EXAM: CHEST  2 VIEW COMPARISON:  None. FINDINGS: Normal heart size. Slight uncoiling of the thoracic aorta with aortic atherosclerosis.  Mild upper lobe emphysematous hyperinflation with crowding of lower lobe interstitial lung markings. Minimal  subsegmental atelectasis at the right lung base overlying the costophrenic angle. No pneumonic consolidation or CHF. No effusion or pneumothorax. Mild degenerative change along the dorsal spine without acute osseous abnormality. IMPRESSION: Upper lobe predominant emphysematous hyperinflation with crowding of lower lobe interstitial lung markings. No pneumonic consolidation or effusion. No CHF. Electronically Signed   By: Ashley Royalty M.D.   On: 07/13/2017 23:39   Ct Abdomen Pelvis W Contrast  Result Date: 07/14/2017 CLINICAL DATA:  62 y/o F; history of appendectomy, cholecystectomy, and colostomy with reversal. Blood in stool or urine. EXAM: CT ABDOMEN AND PELVIS WITH CONTRAST TECHNIQUE: Multidetector CT imaging of the abdomen and pelvis was performed using the standard protocol following bolus administration of intravenous contrast. CONTRAST:  158m ISOVUE-300 IOPAMIDOL (ISOVUE-300) INJECTION 61% COMPARISON:  03/21/2016 and 08/30/2014 CT abdomen and pelvis. FINDINGS: Lower chest: No acute abnormality. Hepatobiliary: Cirrhotic liver. No focal liver abnormality is seen. Status post cholecystectomy. No biliary dilatation. Pancreas: Unremarkable. No pancreatic ductal dilatation or surrounding inflammatory changes. Spleen: Spleen measuring 16.4 x 5.5 x 14.0 cm (volume = 660 cm^3). Adrenals/Urinary Tract: Stable 16 mm nodule within the left adrenal gland (series 2, image 27) from 2016, likely adenoma. Stomach/Bowel: Stomach is within normal limits. Widely patent colorectal anastomosis. No evidence of bowel wall thickening, distention, or inflammatory changes. Vascular/Lymphatic: Aortic atherosclerosis. No enlarged abdominal or pelvic lymph nodes. Reproductive: Status post hysterectomy. No adnexal masses. Other: No abdominal wall hernia or abnormality. No abdominopelvic ascites. Musculoskeletal: No acute or significant osseous findings. IMPRESSION: 1. No acute process identified. 2. Cirrhotic liver. 3. Splenomegaly,  volume 660 cc. 4. Stable left adrenal nodule from 2016, likely adenoma. 5. Aortic atherosclerosis. Electronically Signed   By: LKristine GarbeM.D.   On: 07/14/2017 02:27      Impression / Plan:   KJUNE RODEis a 62y.o. 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 Based on her history, this is unlikely upper GI bleed. Most likely outlet bleeding from rectal varices or internal hemorrhoids. She had several EGDs and colonoscopies in the past which are reassuring.  - Recommend to advance to clear liquid diet - Monitor H&H tonight and tomorrow morning, if hemoglobin is stable, will advance diet tomorrow morning - Defer further endoscopic evaluation at this time unless patient continues to have rectal bleeding - She will need EGD as outpatient for surveillance of varices given her history of cirrhosis - Can discontinue protonix drip, resume outpatient omeprazole daily - Cirrhosis care: Recommend follow-up with GI as outpatient  - CT A/P today did not reveal focal liver lesions  Thank you for involving me in the care of this patient. Will continue to follow    LOS: 0 days   RSherri Sear MD  07/14/2017, 3:34 PM   Note: This dictation was prepared with Dragon dictation along with smaller phrase technology. Any transcriptional errors that result from this process are unintentional.

## 2017-07-15 ENCOUNTER — Encounter: Payer: Self-pay | Admitting: Emergency Medicine

## 2017-07-15 ENCOUNTER — Observation Stay (HOSPITAL_COMMUNITY)
Admission: EM | Admit: 2017-07-15 | Discharge: 2017-07-17 | Disposition: A | Discharge status: Home / Self Care | Payer: Medicare Other | Source: Home / Self Care | Attending: Emergency Medicine | Admitting: Emergency Medicine

## 2017-07-15 DIAGNOSIS — I8510 Secondary esophageal varices without bleeding: Secondary | ICD-10-CM

## 2017-07-15 DIAGNOSIS — R059 Cough, unspecified: Secondary | ICD-10-CM

## 2017-07-15 DIAGNOSIS — K625 Hemorrhage of anus and rectum: Secondary | ICD-10-CM | POA: Diagnosis not present

## 2017-07-15 DIAGNOSIS — K922 Gastrointestinal hemorrhage, unspecified: Secondary | ICD-10-CM | POA: Diagnosis present

## 2017-07-15 DIAGNOSIS — R05 Cough: Secondary | ICD-10-CM

## 2017-07-15 LAB — BASIC METABOLIC PANEL
Anion gap: 8 (ref 5–15)
BUN: 6 mg/dL (ref 6–20)
CALCIUM: 8.4 mg/dL — AB (ref 8.9–10.3)
CO2: 23 mmol/L (ref 22–32)
CREATININE: 0.76 mg/dL (ref 0.44–1.00)
Chloride: 110 mmol/L (ref 101–111)
GFR calc Af Amer: >60 mL/min (ref >60)
Glucose, Bld: 84 mg/dL (ref 65–99)
Potassium: 3.8 mmol/L (ref 3.5–5.1)
SODIUM: 141 mmol/L (ref 135–145)

## 2017-07-15 LAB — COMPREHENSIVE METABOLIC PANEL
ALBUMIN: 2.9 g/dL — AB (ref 3.5–5.0)
ALT: 20 U/L (ref 14–54)
AST: 32 U/L (ref 15–41)
Alkaline Phosphatase: 89 U/L (ref 38–126)
Anion gap: 8 (ref 5–15)
BUN: 6 mg/dL (ref 6–20)
CHLORIDE: 108 mmol/L (ref 101–111)
CO2: 23 mmol/L (ref 22–32)
Calcium: 8.6 mg/dL — ABNORMAL LOW (ref 8.9–10.3)
Creatinine, Ser: 0.94 mg/dL (ref 0.44–1.00)
GFR calc Af Amer: >60 mL/min (ref >60)
GFR calc non Af Amer: >60 mL/min (ref >60)
GLUCOSE: 128 mg/dL — AB (ref 65–99)
Potassium: 3.5 mmol/L (ref 3.5–5.1)
Sodium: 139 mmol/L (ref 135–145)
Total Bilirubin: 0.8 mg/dL (ref 0.3–1.2)
Total Protein: 7.1 g/dL (ref 6.5–8.1)

## 2017-07-15 LAB — GLUCOSE, CAPILLARY
Glucose-Capillary: 76 mg/dL (ref 65–99)
Glucose-Capillary: 119 mg/dL — ABNORMAL HIGH (ref 65–99)

## 2017-07-15 LAB — CBC WITH DIFFERENTIAL/PLATELET
BASOS ABS: 0 10*3/uL (ref 0–0.1)
Basophils Relative: 1 %
Eosinophils Absolute: 0.1 10*3/uL (ref 0–0.7)
Eosinophils Relative: 3 %
HEMATOCRIT: 35.2 % (ref 35.0–47.0)
Hemoglobin: 11.2 g/dL — ABNORMAL LOW (ref 12.0–16.0)
LYMPHS PCT: 30 %
Lymphs Abs: 0.9 10*3/uL — ABNORMAL LOW (ref 1.0–3.6)
MCH: 27.5 pg (ref 26.0–34.0)
MCHC: 31.8 g/dL — AB (ref 32.0–36.0)
MCV: 86.6 fL (ref 80.0–100.0)
MONO ABS: 0.3 10*3/uL (ref 0.2–0.9)
MONOS PCT: 8 %
NEUTROS ABS: 1.8 10*3/uL (ref 1.4–6.5)
Neutrophils Relative %: 58 %
Platelets: 166 10*3/uL (ref 150–440)
RBC: 4.06 MIL/uL (ref 3.80–5.20)
RDW: 16.7 % — AB (ref 11.5–14.5)
WBC: 3.1 10*3/uL — ABNORMAL LOW (ref 3.6–11.0)

## 2017-07-15 LAB — HIV ANTIBODY (ROUTINE TESTING W REFLEX): HIV Screen 4th Generation wRfx: NONREACTIVE

## 2017-07-15 LAB — LIPASE, BLOOD: Lipase: 68 U/L — ABNORMAL HIGH (ref 11–51)

## 2017-07-15 LAB — HEMOGLOBIN AND HEMATOCRIT, BLOOD
HCT: 32.9 % — ABNORMAL LOW (ref 35.0–47.0)
HEMOGLOBIN: 10.5 g/dL — AB (ref 12.0–16.0)

## 2017-07-15 MED ORDER — IOPAMIDOL (ISOVUE-300) INJECTION 61%
30.0000 mL | Freq: Once | INTRAVENOUS | Status: AC | PRN
  Administered 2017-07-15: 30 mL via ORAL

## 2017-07-15 MED ORDER — DEXTROSE 5 % IV SOLN
1.0000 g | INTRAVENOUS | Status: DC
  Filled 2017-07-15: qty 10

## 2017-07-15 MED ORDER — CEPHALEXIN 500 MG PO CAPS: 500.0000 mg | ORAL_CAPSULE | Freq: Three times a day (TID) | ORAL | 0 refills | Status: DC

## 2017-07-15 NOTE — ED Triage Notes (Signed)
Pt to ED via EMS from home with c/o bright red blood from rectum. PT was seen here today and discharged with UTI, was seen by OB for vaginal bleeding. PT VS stable, pt [id number]&[id number]

## 2017-07-15 NOTE — Care Management CC44 (Signed)
Condition Code 44 Documentation Completed  Patient Details  Name: [name] MRN: [id number] Date of Birth: April 15, 1956   Condition Code 44 given:  Yes Patient signature on Condition Code 44 notice:  Yes Documentation of 2 MD's agreement:  Yes Code 44 added to claim:  Yes    Beverly Sessions, RN 07/15/2017, 10:41 AM

## 2017-07-15 NOTE — ED Provider Notes (Addendum)
[facility] Emergency Department Provider Note   ____________________________________________   First MD Initiated Contact with Patient 07/15/17 2153     (approximate)  I have reviewed the triage vital signs and the nursing notes.   HISTORY   Chief Complaint Rectal Bleeding    HPI [name] is a 62 y.o. female Patient was discharged from the hospital with diagnosis of UTI and vaginal bleeding and possibly rectal bleeding as well. Patient went home said she had a stool and lots of bright red blood per rectum and was lightheaded. She also complains of abdominal pain and a bump under one of the scars on her belly. It's tender. She has no nausea or vomiting. The pain is mild to moderate and achy. She does not have pain with rectal bleeding.   Past Medical History:  Diagnosis Date  . Allergy   . Anemia   . Anxiety   . CHF (congestive heart failure) ([place])   . Chronic bronchitis ([place])   . Chronic kidney disease   . Cirrhosis ([place])   . Depression   . Diabetes mellitus without complication ([place])   . Diverticulitis   . Emphysema of lung ([place])   . GERD (gastroesophageal reflux disease)   . H/O drug abuse    Clean since 2009  . H/O ETOH abuse    Sober since 2009  . Hepatitis C   . History of sepsis 2016  . Hypertension   . Malignant brain tumor ([place]) 2007  . Migraines   . Pancreatic ascites   . Pancreatitis, alcoholic 9379  . Rectovaginal fistula   . Seizures [facility])    Last seizure October 2017  . Spleen enlarged   . Stroke [facility]) 2007, 2008   during brain surgery  . Urine incontinence     Patient Active Problem List   Diagnosis Date Noted  . Vaginal candida 12/11/2016  . 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 Infection 02/26/2016  . Acute intractable headache 02/24/2016  . Acute headache 02/24/2016  . Osteomyelitis of skull ([place])   . Encephalopathy   . Status post craniectomy 12/28/2015  . Wound infection after surgery 12/26/2015  . UTI  (lower urinary tract infection) 10/22/2015  . Chest pain on breathing   . SOB (shortness of breath)   . Wheezing   . Subdural hematoma ([place]) 08/16/2015  . GI bleed 06/24/2015  . Pyelonephritis 06/24/2015  . Sepsis ([place]) 06/22/2015  . Orthostatic hypotension 05/15/2015  . Alcohol-induced acute pancreatitis 03/13/2015  . Abdominal pain 02/08/2015  . Chronic pain syndrome 02/08/2015  . Closed fracture of coccyx ([place]) 02/02/2015  . COPD (chronic obstructive pulmonary disease) ([place]) 02/02/2015  . Primary insomnia 02/02/2015  . Gastro-esophageal reflux disease with esophagitis 02/02/2015  . Stress incontinence in female 02/02/2015  . ALC (alcoholic liver cirrhosis) ([place]) 12/23/2014  . Clinical depression 12/23/2014  . Anxiety 12/23/2014  . Diabetes mellitus, type 2 ([place]) 12/23/2014  . HTN (hypertension) 11/05/2014  . Liver cirrhosis ([place]) 07/19/2014  . Liver failure ([place]) 02/18/2013  . 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 Chronic pancreatitis ([place]) 11/03/2012  . Seizure disorder ([place]) 11/03/2012  . Benign meningioma of brain ([place]) 11/03/2012  . Chronic pelvic pain in female 11/03/2012  . Hepatitis C virus infection without hepatic coma 11/03/2012  . UTI (urinary tract infection) 11/03/2012  . Alcohol abuse, unspecified 11/03/2012  . Chronic liver disease 11/03/2012  . Schizophrenia ([place]) 11/03/2012  . Narcotic abuse ([place]) 11/03/2012  . 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  Breast screening 11/03/2012  . Convulsions, epileptic ([place]) 11/03/2012  . Nondependent alcohol abuse 11/03/2012  . 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 Female genital symptoms 11/03/2012    Past Surgical History:  Procedure Laterality Date  . ABDOMINAL HYSTERECTOMY  1979  . APPENDECTOMY  1999  . BRAIN SURGERY  2007   malignant brain tumor  . CHOLECYSTECTOMY  2001  . COLON SURGERY  2006  . CRANIOPLASTY N/A 09/15/2015   Procedure:  Cranioplasty with retrieval of bone flap from abdominal pocket;  Surgeon: Ashok Pall, MD;  Location: [facility];  Service: Neurosurgery;  Laterality: N/A;  Cranioplasty with retrieval of bone flap from abdominal pocket  . CRANIOTOMY Right 08/16/2015   Procedure: Right Fronto-Temporal-Parietal Craniotomy for Evacuation of Hematoma with Bone Flap Placement in Abdomen;  Surgeon: Ashok Pall, MD;  Location: [place];  Service: Neurosurgery;  Laterality: Right;  . CRANIOTOMY N/A 12/28/2015   Procedure: Craniectomy for wound debridement;  Surgeon: Ashok Pall, MD;  Location: [facility];  Service: Neurosurgery;  Laterality: N/A;  Craniectomy for wound debridement  . DILATION AND CURETTAGE OF UTERUS    . EYE SURGERY    . JOINT REPLACEMENT Left    TOTAL KNEE REPLACEMENT  . rectovaginal fistula repair w/ colostomy  2011  . REPLACEMENT TOTAL KNEE Left 2005    Prior to Admission medications   Medication Sig Start Date End Date Taking? Authorizing Provider  albuterol (PROVENTIL HFA;VENTOLIN HFA) 108 (90 Base) MCG/ACT inhaler Inhale 2 puffs into the lungs every 6 (six) hours as needed for wheezing or shortness of breath.   Yes [provider]  albuterol (PROVENTIL) (5 MG/ML) 0.5% nebulizer solution Take 2.5 mg by nebulization every 6 (six) hours as needed for wheezing or shortness of breath.    Yes [provider]  ALPRAZolam Duanne Moron) 1 MG tablet Take 1 tablet (1 mg total) by mouth 2 (two) times daily as needed for anxiety. Patient taking differently: Take 1 mg by mouth at bedtime as needed.  03/14/16  Yes Rubie Maid, MD  amitriptyline (ELAVIL) 50 MG tablet Take 50 mg by mouth at bedtime.   Yes [provider]  amLODipine (NORVASC) 10 MG tablet Take 10 mg by mouth daily.   Yes [provider]  cephALEXin (KEFLEX) 500 MG capsule Take 1 capsule (500 mg total) by mouth 3 (three) times daily. 07/15/17  Yes Demetrios Loll, MD  diphenhydrAMINE (BENADRYL) 25 mg capsule Take 1  capsule (25 mg total) by mouth every 8 (eight) hours as needed for itching. 10/25/15  Yes Vaughan Basta, MD  Fluticasone-Salmeterol (ADVAIR) 250-50 MCG/DOSE AEPB Inhale 1 puff into the lungs 2 (two) times daily.    Yes [provider]  furosemide (LASIX) 20 MG tablet Take 20 mg by mouth every morning.    Yes [provider]  gabapentin (NEURONTIN) 300 MG capsule Take 600 mg by mouth 4 (four) times daily.  12/11/15  Yes [provider]  hydrOXYzine (ATARAX/VISTARIL) 25 MG tablet Take 1 tablet by mouth  every 6 hours as needed 03/08/16  Yes [provider]  Ipratropium-Albuterol (COMBIVENT) 20-100 MCG/ACT AERS respimat Inhale 2 puffs into the lungs every 6 (six) hours as needed for wheezing or shortness of breath.    Yes [provider]  levETIRAcetam (KEPPRA) 500 MG tablet Take 500 mg by mouth 2 (two) times daily.  10/18/15  Yes [provider]  lipase/protease/amylase (CREON) 12000 units CPEP capsule Take 24,000 Units  by mouth 3 (three) times daily with meals.   Yes [provider]  mometasone-formoterol (DULERA) 200-5 MCG/ACT AERO Inhale 2 puffs into the lungs 2 (two) times daily.   Yes [provider]  omeprazole (PRILOSEC) 20 MG capsule Take 20 mg by mouth every morning.  11/29/15  Yes [provider]  Oxycodone HCl 20 MG TABS Take 20 mg by mouth 3 (three) times daily as needed (pain).   Yes [provider]  phenytoin (DILANTIN) 100 MG/4ML suspension Take 300 mg by mouth 2 (two) times daily. Takes 31m by mouth bid   Yes [provider]  pravastatin (PRAVACHOL) 10 MG tablet Take 10 mg by mouth daily.   Yes [provider]  prochlorperazine (COMPAZINE) 10 MG tablet Take 10 mg by mouth every 12 (twelve) hours as needed for nausea or vomiting.   Yes [provider]  promethazine (PHENERGAN) 25 MG tablet Take 25 mg by mouth every 8 (eight) hours as needed for nausea or vomiting.   Yes  [provider]  sertraline (ZOLOFT) 100 MG tablet Take 200 mg by mouth daily.   Yes [provider]  spironolactone (ALDACTONE) 50 MG tablet Take 1 tablet (50 mg total) by mouth 2 (two) times daily. 10/25/15  Yes VVaughan Basta MD  sucralfate (CARAFATE) 1 g tablet Take 1 g by mouth 4 (four) times daily.   Yes [provider]  tiotropium (SPIRIVA) 18 MCG inhalation capsule Place 18 mcg into inhaler and inhale daily as needed (shortness of breath).    Yes [provider]  traMADol (ULTRAM) 50 MG tablet Take 1 tablet (50 mg total) by mouth every 6 (six) hours as needed for moderate pain. 06/02/16  Yes SSable Feil PA-C  triamcinolone cream (KENALOG) 0.1 % apply to affected area twice a day 02/07/16  Yes [provider]  zolpidem (AMBIEN) 5 MG tablet Take 1 tablet (5 mg total) by mouth at bedtime as needed for sleep. 02/28/16  Yes MBettey Costa MD  Blood Glucose Monitoring Suppl (FIFTY50 GLUCOSE METER 2.0) w/Device KIT ICD-10 E11.9 Check blood sugars once daily 09/04/15   [provider]  brompheniramine-pseudoephedrine-DM 30-2-10 MG/5ML syrup Take 5 mLs by mouth 4 (four) times daily as needed. Patient not taking: Reported on 07/14/2017 06/02/16   SSable Feil PA-C  cyproheptadine (PERIACTIN) 4 MG tablet Take 1 tablet (4 mg total) by mouth 3 (three) times daily as needed for allergies. Patient not taking: Reported on 07/14/2017 06/02/16   SSable Feil PA-C  triamcinolone (KENALOG) 0.025 % ointment Apply 1 application topically 2 (two) times daily. Patient not taking: Reported on 07/14/2017 03/17/16   CRubie Maid MD    Allergies Ibuprofen; Sulfa antibiotics; Hydroxyzine; Amoxicillin; Chocolate; Chocolate flavor; Ciprofloxacin; Dilaudid [hydromorphone hcl]; Fentanyl; Hydromorphone; Hydromorphone hcl; Metformin; Penicillins; and Strawberry extract  Family History  Problem Relation Age of Onset  . Hypertension Mother   . Heart disease  Father   . Diabetes Father   . Cancer Sister        brain  . Cancer Grandchild 8       brain tumor    Social History Social History   Tobacco Use  . Smoking status: Current Every Day Smoker    Packs/day: 1.00    Types: Cigarettes  . Smokeless tobacco: Never Used  Substance Use Topics  . Alcohol use: Yes    Alcohol/week: 2.4 oz    Types: 4 Cans of beer per week  . Drug use: Yes  Types: Cocaine    Review of Systems  Constitutional: No fever/chills Eyes: No visual changes. ENT: No sore throat. Cardiovascular: Denies chest pain. Respiratory: Denies shortness of breath. Gastrointestinal: see history of present illness Genitourinary: Negative for dysuria. Musculoskeletal: Negative for back pain. Skin: Negative for rash. Neurological: Negative for headaches, focal weakness  ____________________________________________   PHYSICAL EXAM:  VITAL SIGNS: ED Triage Vitals  Enc Vitals Group     BP 07/15/17 2130 133/83     Pulse Rate 07/15/17 2112 90     Resp 07/15/17 2112 16     Temp 07/15/17 2112 98.1 F (36.7 C)     Temp Source 07/15/17 2112 Oral     SpO2 07/15/17 2112 100 %     Weight --      Height --      Head Circumference --      Peak Flow --      Pain Score 07/15/17 2112 8     Pain Loc --      Pain Edu? --      Excl. in [place]? --     Constitutional: Alert and oriented. Well appearing and in no acute distress. Eyes: Conjunctivae are normal.  Head: Atraumatic. Nose: No congestion/rhinnorhea. Mouth/Throat: Mucous membranes are moist.  Oropharynx non-erythematous. Neck: No stridor.  Cardiovascular: Normal rate, regular rhythm. Grossly normal heart sounds.  Good peripheral circulation. Respiratory: Normal respiratory effort.  No retractions. Lungs CTAB. Gastrointestinal: Soft tender in the right mid upper quadrant over the scar as described in history of present illness No distention. No abdominal bruits. No CVA tenderness. rectal: No hemorrhoid seen or  palpated there is dark red blood that is on my finger when I pull my finger out. Musculoskeletal: No lower extremity tenderness nor edema.  No joint effusions. Neurologic:  Normal speech and language. No gross focal neurologic deficits are appreciated. Skin:  Skin is warm, dry and intact. No rash noted. Psychiatric: Mood and affect are normal. Speech and behavior are normal.  ____________________________________________   LABS (all labs ordered are listed, but only abnormal results are displayed)  Labs Reviewed  COMPREHENSIVE METABOLIC PANEL - Abnormal; Notable for the following components:      Result Value   Glucose, Bld 128 (*)    Calcium 8.6 (*)    Albumin 2.9 (*)    All other components within normal limits  LIPASE, BLOOD - Abnormal; Notable for the following components:   Lipase 68 (*)    All other components within normal limits  CBC WITH DIFFERENTIAL/PLATELET - Abnormal; Notable for the following components:   WBC 3.1 (*)    Hemoglobin 11.2 (*)    MCHC 31.8 (*)    RDW 16.7 (*)    Lymphs Abs 0.9 (*)    All other components within normal limits  TYPE AND SCREEN  TYPE AND SCREEN   ____________________________________________  EKG   ____________________________________________  RADIOLOGY   ____________________________________________   PROCEDURES  Procedure(s) performed:  Procedures  Critical Care performed:   ____________________________________________   INITIAL IMPRESSION / ASSESSMENT AND PLAN / ED COURSE  patient actually had a CT scan yesterday that was okay. Therefore I will not CT her again today. Her hemoglobin is higher today than it was yesterday and she is not orthostatic.however she just had another large bowel movement which was really just blood. She reports she's had 5 of these since she left the hospital today. I think the best thing to do would be at that her for observation tonight  and then had GI actually scope her in the morning.      ____________________________________________   FINAL CLINICAL IMPRESSION(S) / ED DIAGNOSES  Final diagnoses:  Rectal bleeding     ED Discharge Orders    None       Note:  This document was prepared using Dragon voice recognition software and may include unintentional dictation errors.    Nena Polio, MD 07/16/17 0002    Nena Polio, MD 07/16/17 0030

## 2017-07-15 NOTE — Discharge Instructions (Signed)
Heart healthy ADA diet. Follow up OBGYN physician as outpatient.

## 2017-07-15 NOTE — Discharge Summary (Signed)
[place] [facility] [place] NAME: [name]    MR#:  [id number]  DATE OF BIRTH:  [birth year]  DATE OF ADMISSION:  07/13/2017   ADMITTING PHYSICIAN: Saundra Shelling, MD  DATE OF DISCHARGE: 07/15/2017 12:11 PM  PRIMARY CARE PHYSICIAN: System, Pcp Not In   ADMISSION DIAGNOSIS:  Rectal bleeding [K62.5] Generalized abdominal pain [R10.84] Lower GI bleed [K92.2] Chronic pancreatitis, unspecified pancreatitis type ([place]) [K86.1] DISCHARGE DIAGNOSIS:  Active Problems:   GI bleed  SECONDARY DIAGNOSIS:   Past Medical History:  Diagnosis Date  . Allergy   . Anemia   . Anxiety   . CHF (congestive heart failure) ([place])   . Chronic bronchitis ([place])   . Chronic kidney disease   . Cirrhosis ([place])   . Depression   . Diabetes mellitus without complication ([place])   . Diverticulitis   . Emphysema of lung ([place])   . GERD (gastroesophageal reflux disease)   . H/O drug abuse    Clean since 2009  . H/O ETOH abuse    Sober since 2009  . Hepatitis C   . History of sepsis 2016  . Hypertension   . Malignant brain tumor ([place]) 2007  . Migraines   . Pancreatic ascites   . Pancreatitis, alcoholic 6948  . Rectovaginal fistula   . Seizures [facility])    Last seizure October 2017  . Spleen enlarged   . Stroke [facility]) 2007, 2008   during brain surgery  . Urine incontinence    HOSPITAL COURSE:   1.  Acute gastrointestinal bleeding Per GI Dr. Marius Ditch, follow-up as outpatient for endoscopy.  Anemia due to acute blood loss.  Hemoglobin is stable. 2.  Urinary tract infection. she has been treated with IV Rocephin, changed to Keflex.  Follow-up urine culture with PCP. 3.  Cirrhosis of liver 4.  Diabetes mellitus type 2.  She is on sliding scale. 5.  Vaginal bleeding.  I discussed with OB/GYN physician, who said see her today.  The patient is following up with Dr. Kenton Kingfisher as outpatient. 6.  Morbid obesity. DISCHARGE CONDITIONS:  Stable, discharged to home  today. CONSULTS OBTAINED:  Treatment Team:  Lin Landsman, MD DRUG ALLERGIES:   Allergies  Allergen Reactions  . Ibuprofen Other (See Comments)    Pt states that she is unable to take this medication because of her liver.    . Sulfa Antibiotics Hives  . Hydroxyzine Other (See Comments)    Causes crying   . Amoxicillin Itching, Rash and Other (See Comments)    Has patient had a PCN reaction causing immediate rash, facial/tongue/throat swelling, SOB or lightheadedness with hypotension: No Has patient had a PCN reaction causing severe rash involving mucus membranes or skin necrosis: No Has patient had a PCN reaction that required hospitalization No Has patient had a PCN reaction occurring within the last 10 years: Yes If all of the above answers are "NO", then may proceed with Cephalosporin use.  . Chocolate Rash  . Chocolate Flavor Rash  . Ciprofloxacin Itching and Rash  . Dilaudid [Hydromorphone Hcl] Itching  . Fentanyl Rash  . Hydromorphone Itching  . Hydromorphone Hcl Itching  . Metformin Nausea And Vomiting  . Penicillins Itching, Rash and Other (See Comments)    Has patient had a PCN reaction causing immediate rash, facial/tongue/throat swelling, SOB or lightheadedness with hypotension: No Has patient had a PCN reaction causing severe rash involving mucus membranes or skin necrosis: No Has patient had a PCN  reaction that required hospitalization No Has patient had a PCN reaction occurring within the last 10 years: Yes If all of the above answers are "NO", then may proceed with Cephalosporin use.  . Strawberry Extract Rash   DISCHARGE MEDICATIONS:   Allergies as of 07/15/2017      Reactions   Ibuprofen Other (See Comments)   Pt states that she is unable to take this medication because of her liver.    Sulfa Antibiotics Hives   Hydroxyzine Other (See Comments)   Causes crying    Amoxicillin Itching, Rash, Other (See Comments)   Has patient had a PCN reaction causing  immediate rash, facial/tongue/throat swelling, SOB or lightheadedness with hypotension: No Has patient had a PCN reaction causing severe rash involving mucus membranes or skin necrosis: No Has patient had a PCN reaction that required hospitalization No Has patient had a PCN reaction occurring within the last 10 years: Yes If all of the above answers are "NO", then may proceed with Cephalosporin use.   Chocolate Rash   Chocolate Flavor Rash   Ciprofloxacin Itching, Rash   Dilaudid [hydromorphone Hcl] Itching   Fentanyl Rash   Hydromorphone Itching   Hydromorphone Hcl Itching   Metformin Nausea And Vomiting   Penicillins Itching, Rash, Other (See Comments)   Has patient had a PCN reaction causing immediate rash, facial/tongue/throat swelling, SOB or lightheadedness with hypotension: No Has patient had a PCN reaction causing severe rash involving mucus membranes or skin necrosis: No Has patient had a PCN reaction that required hospitalization No Has patient had a PCN reaction occurring within the last 10 years: Yes If all of the above answers are "NO", then may proceed with Cephalosporin use.   Strawberry Extract Rash      Medication List    TAKE these medications   albuterol 108 (90 Base) MCG/ACT inhaler Commonly known as:  PROVENTIL HFA;VENTOLIN HFA Inhale 2 puffs into the lungs every 6 (six) hours as needed for wheezing or shortness of breath.   albuterol (5 MG/ML) 0.5% nebulizer solution Commonly known as:  PROVENTIL Take 2.5 mg by nebulization every 6 (six) hours as needed for wheezing or shortness of breath.   ALPRAZolam 1 MG tablet Commonly known as:  XANAX Take 1 tablet (1 mg total) by mouth 2 (two) times daily as needed for anxiety. What changed:    when to take this  reasons to take this   amitriptyline 50 MG tablet Commonly known as:  ELAVIL Take 50 mg by mouth at bedtime.   amLODipine 10 MG tablet Commonly known as:  NORVASC Take 10 mg by mouth daily.     brompheniramine-pseudoephedrine-DM 30-2-10 MG/5ML syrup Take 5 mLs by mouth 4 (four) times daily as needed.   cephALEXin 500 MG capsule Commonly known as:  KEFLEX Take 1 capsule (500 mg total) by mouth 3 (three) times daily.   cyproheptadine 4 MG tablet Commonly known as:  PERIACTIN Take 1 tablet (4 mg total) by mouth 3 (three) times daily as needed for allergies.   diphenhydrAMINE 25 mg capsule Commonly known as:  BENADRYL Take 1 capsule (25 mg total) by mouth every 8 (eight) hours as needed for itching.   DULERA 200-5 MCG/ACT Aero Generic drug:  mometasone-formoterol Inhale 2 puffs into the lungs 2 (two) times daily.   FIFTY50 GLUCOSE METER 2.0 w/Device Kit ICD-10 E11.9 Check blood sugars once daily   Fluticasone-Salmeterol 250-50 MCG/DOSE Aepb Commonly known as:  ADVAIR Inhale 1 puff into the lungs 2 (two) times  daily.   furosemide 20 MG tablet Commonly known as:  LASIX Take 20 mg by mouth every morning.   gabapentin 300 MG capsule Commonly known as:  NEURONTIN Take 600 mg by mouth 4 (four) times daily.   hydrOXYzine 25 MG tablet Commonly known as:  ATARAX/VISTARIL Take 1 tablet by mouth  every 6 hours as needed   Ipratropium-Albuterol 20-100 MCG/ACT Aers respimat Commonly known as:  COMBIVENT Inhale 2 puffs into the lungs every 6 (six) hours as needed for wheezing or shortness of breath.   levETIRAcetam 500 MG tablet Commonly known as:  KEPPRA Take 500 mg by mouth 2 (two) times daily.   lipase/protease/amylase 12000 units Cpep capsule Commonly known as:  CREON Take 24,000 Units by mouth 3 (three) times daily with meals.   omeprazole 20 MG capsule Commonly known as:  PRILOSEC Take 20 mg by mouth every morning.   Oxycodone HCl 20 MG Tabs Take 20 mg by mouth 3 (three) times daily as needed (pain).   phenytoin 100 MG/4ML suspension Commonly known as:  DILANTIN Take 300 mg by mouth 2 (two) times daily. Takes 65m by mouth bid   pravastatin 10 MG  tablet Commonly known as:  PRAVACHOL Take 10 mg by mouth daily.   prochlorperazine 10 MG tablet Commonly known as:  COMPAZINE Take 10 mg by mouth every 12 (twelve) hours as needed for nausea or vomiting.   promethazine 25 MG tablet Commonly known as:  PHENERGAN Take 25 mg by mouth every 8 (eight) hours as needed for nausea or vomiting.   sertraline 100 MG tablet Commonly known as:  ZOLOFT Take 200 mg by mouth daily.   spironolactone 50 MG tablet Commonly known as:  ALDACTONE Take 1 tablet (50 mg total) by mouth 2 (two) times daily.   sucralfate 1 g tablet Commonly known as:  CARAFATE Take 1 g by mouth 4 (four) times daily.   tiotropium 18 MCG inhalation capsule Commonly known as:  SPIRIVA Place 18 mcg into inhaler and inhale daily as needed (shortness of breath).   traMADol 50 MG tablet Commonly known as:  ULTRAM Take 1 tablet (50 mg total) by mouth every 6 (six) hours as needed for moderate pain. What changed:  Another medication with the same name was removed. Continue taking this medication, and follow the directions you see here.   triamcinolone cream 0.1 % Commonly known as:  KENALOG apply to affected area twice a day   triamcinolone 0.025 % ointment Commonly known as:  KENALOG Apply 1 application topically 2 (two) times daily.   zolpidem 5 MG tablet Commonly known as:  AMBIEN Take 1 tablet (5 mg total) by mouth at bedtime as needed for sleep.        DISCHARGE INSTRUCTIONS:  See AVS.  If you experience worsening of your admission symptoms, develop shortness of breath, life threatening emergency, suicidal or homicidal thoughts you must seek medical attention immediately by calling 911 or calling your MD immediately  if symptoms less severe.  You Must read complete instructions/literature along with all the possible adverse reactions/side effects for all the Medicines you take and that have been prescribed to you. Take any new Medicines after you have  completely understood and accpet all the possible adverse reactions/side effects.   Please note  You were cared for by a hospitalist during your hospital stay. If you have any questions about your discharge medications or the care you received while you were in the hospital after you are discharged, you  can call the unit and asked to speak with the hospitalist on call if the hospitalist that took care of you is not available. Once you are discharged, your primary care physician will handle any further medical issues. Please note that NO REFILLS for any discharge medications will be authorized once you are discharged, as it is imperative that you return to your primary care physician (or establish a relationship with a primary care physician if you do not have one) for your aftercare needs so that they can reassess your need for medications and monitor your lab values.    On the day of Discharge:  VITAL SIGNS:  Blood pressure 93/61, pulse 82, temperature (!) 97.5 F (36.4 C), temperature source Oral, resp. rate 18, height '5\' 7"'$  (1.702 m), weight 275 lb 8 oz (125 kg), SpO2 99 %. PHYSICAL EXAMINATION:  GENERAL:  62 y.o.-year-old patient lying in the bed with no acute distress.  Morbid obesity. EYES: Pupils equal, round, reactive to light and accommodation. No scleral icterus. Extraocular muscles intact.  HEENT: Head atraumatic, normocephalic. Oropharynx and nasopharynx clear.  NECK:  Supple, no jugular venous distention. No thyroid enlargement, no tenderness.  LUNGS: Normal breath sounds bilaterally, no wheezing, rales,rhonchi or crepitation. No use of accessory muscles of respiration.  CARDIOVASCULAR: S1, S2 normal. No murmurs, rubs, or gallops.  ABDOMEN: Soft, non-tender, non-distended. Bowel sounds present. No organomegaly or mass.  EXTREMITIES: No pedal edema, cyanosis, or clubbing.  NEUROLOGIC: Cranial nerves II through XII are intact. Muscle strength 5/5 in all extremities. Sensation intact.  Gait not checked.  PSYCHIATRIC: The patient is alert and oriented x 3.  SKIN: No obvious rash, lesion, or ulcer.  DATA REVIEW:   CBC Recent Labs  Lab 07/13/17 2228  07/15/17 0908  WBC 4.9  --   --   HGB 11.6*   < > 10.5*  HCT 36.2   < > 32.9*  PLT 193  --   --    < > = values in this interval not displayed.    Chemistries  Recent Labs  Lab 07/13/17 2228 07/14/17 0530 07/15/17 0435  NA 137 138 141  K 3.2* 3.1* 3.8  CL 104 108 110  CO2 '24 23 23  '$ GLUCOSE 120* 104* 84  BUN '8 7 6  '$ CREATININE 0.93 0.65 0.76  CALCIUM 8.5* 8.4* 8.4*  MG  --  2.0  --   AST 37  --   --   ALT 25  --   --   ALKPHOS 98  --   --   BILITOT 0.8  --   --      Microbiology Results  Results for orders placed or performed during the hospital encounter of 07/13/17  MRSA PCR Screening     Status: None   Collection Time: 07/14/17  5:16 AM  Result Value Ref Range Status   MRSA by PCR NEGATIVE NEGATIVE Final    Comment:        The GeneXpert MRSA Assay (FDA approved for NASAL specimens only), is one component of a comprehensive MRSA colonization surveillance program. It is not intended to diagnose MRSA infection nor to guide or monitor treatment for MRSA infections. Performed at [facility], [address], [place], [place] [address]     RADIOLOGY:  No results found.   Management plans discussed with the patient, family and they are in agreement.  CODE STATUS: Full Code   TOTAL TIME TAKING CARE OF THIS PATIENT: 35 minutes.    Sheppard Evens  Kipling Graser M.D on 07/15/2017 at 12:16 PM  Between 7am to 6pm - Pager - [phone]  After 6pm go to [email or web address] - Proofreader  [facility]  Office  [phone]  CC: Primary care physician; System, Pcp Not In   Note: This dictation was prepared with Dragon dictation along with smaller phrase technology. Any transcriptional errors that result from this process are unintentional.

## 2017-07-15 NOTE — Care Management Obs Status (Signed)
[facility] NOTIFICATION   Patient Details  Name: CHATTIE GREESON MRN: [id number] Date of Birth: June 12, 1956   Medicare Observation Status Notification Given:  Yes    Beverly Sessions, RN 07/15/2017, 10:41 AM

## 2017-07-15 NOTE — Progress Notes (Signed)
Pt noted to have some blood in her brief. Upon further inspection, bleeding noted to be coming from vagina, not rectum. Will continue to monitor.

## 2017-07-15 NOTE — Progress Notes (Signed)
IV's were removed. Discharge instructions, follow-up appointments, and prescriptions were provided to the pt. All questions answered. The pt is waiting on a wheelchair to go downstairs.

## 2017-07-15 NOTE — Discharge Instructions (Signed)
a blood count is actually better than yesterday. Her blood pressure and pulse do not change when he go from lying to standing. Everything looks better today. Her CT scan was okay yesterday so will not repeat it today. Please follow-up with the gastroenterologist as you are planning to do. Return for heavier bleeding or lightheadedness.

## 2017-07-16 ENCOUNTER — Other Ambulatory Visit: Payer: Self-pay

## 2017-07-16 ENCOUNTER — Observation Stay: Payer: Medicare Other

## 2017-07-16 DIAGNOSIS — K766 Portal hypertension: Secondary | ICD-10-CM | POA: Diagnosis not present

## 2017-07-16 DIAGNOSIS — K625 Hemorrhage of anus and rectum: Secondary | ICD-10-CM | POA: Diagnosis not present

## 2017-07-16 DIAGNOSIS — K6389 Other specified diseases of intestine: Secondary | ICD-10-CM

## 2017-07-16 DIAGNOSIS — K3189 Other diseases of stomach and duodenum: Secondary | ICD-10-CM | POA: Diagnosis not present

## 2017-07-16 DIAGNOSIS — K7469 Other cirrhosis of liver: Secondary | ICD-10-CM | POA: Diagnosis not present

## 2017-07-16 DIAGNOSIS — I8510 Secondary esophageal varices without bleeding: Secondary | ICD-10-CM | POA: Diagnosis not present

## 2017-07-16 LAB — TYPE AND SCREEN
ABO/RH(D): A POS
Antibody Screen: POSITIVE
PT AG TYPE: POSITIVE
UNIT DIVISION: 0
UNIT DIVISION: 0

## 2017-07-16 LAB — BASIC METABOLIC PANEL
ANION GAP: 6 (ref 5–15)
BUN: 5 mg/dL — AB (ref 6–20)
CALCIUM: 8.3 mg/dL — AB (ref 8.9–10.3)
CO2: 22 mmol/L (ref 22–32)
Chloride: 111 mmol/L (ref 101–111)
Creatinine, Ser: 0.85 mg/dL (ref 0.44–1.00)
GFR calc Af Amer: >60 mL/min (ref >60)
GFR calc non Af Amer: >60 mL/min (ref >60)
GLUCOSE: 80 mg/dL (ref 65–99)
Potassium: 3.8 mmol/L (ref 3.5–5.1)
Sodium: 139 mmol/L (ref 135–145)

## 2017-07-16 LAB — BPAM RBC
BLOOD PRODUCT EXPIRATION DATE: 201902012359
Blood Product Expiration Date: 201901292359
Unit Type and Rh: 6200
Unit Type and Rh: 6200

## 2017-07-16 LAB — HEMOGLOBIN AND HEMATOCRIT, BLOOD
HCT: 34.9 % — ABNORMAL LOW (ref 35.0–47.0)
HEMATOCRIT: 31.7 % — AB (ref 35.0–47.0)
HEMATOCRIT: 33.6 % — AB (ref 35.0–47.0)
HEMATOCRIT: 36.1 % (ref 35.0–47.0)
HEMOGLOBIN: 10.1 g/dL — AB (ref 12.0–16.0)
HEMOGLOBIN: 10.6 g/dL — AB (ref 12.0–16.0)
HEMOGLOBIN: 11.3 g/dL — AB (ref 12.0–16.0)
Hemoglobin: 11 g/dL — ABNORMAL LOW (ref 12.0–16.0)

## 2017-07-16 MED ORDER — PEG 3350-KCL-NA BICARB-NACL 420 G PO SOLR
4000.0000 mL | Freq: Once | ORAL | Status: AC
  Administered 2017-07-16: 4000 mL via ORAL
  Filled 2017-07-16: qty 4000

## 2017-07-16 MED ORDER — IPRATROPIUM-ALBUTEROL 0.5-2.5 (3) MG/3ML IN SOLN
3.0000 mL | Freq: Four times a day (QID) | RESPIRATORY_TRACT | Status: DC
  Administered 2017-07-17: 3 mL via RESPIRATORY_TRACT
  Filled 2017-07-16 (×4): qty 3

## 2017-07-16 MED ORDER — IPRATROPIUM-ALBUTEROL 0.5-2.5 (3) MG/3ML IN SOLN: 3.0000 mL | Freq: Four times a day (QID) | RESPIRATORY_TRACT | Status: DC | PRN

## 2017-07-16 MED ORDER — PHENYTOIN 100 MG/4ML PO SUSP
300.0000 mg | Freq: Two times a day (BID) | ORAL | Status: DC
  Administered 2017-07-16 (×2): 300 mg via ORAL
  Filled 2017-07-16 (×4): qty 12

## 2017-07-16 MED ORDER — PANTOPRAZOLE SODIUM 40 MG IV SOLR
40.0000 mg | Freq: Two times a day (BID) | INTRAVENOUS | Status: DC
  Administered 2017-07-16 (×2): 40 mg via INTRAVENOUS
  Filled 2017-07-16 (×2): qty 40

## 2017-07-16 MED ORDER — GABAPENTIN 300 MG PO CAPS
600.0000 mg | ORAL_CAPSULE | Freq: Four times a day (QID) | ORAL | Status: DC
  Administered 2017-07-16 (×4): 600 mg via ORAL
  Filled 2017-07-16 (×5): qty 2

## 2017-07-16 MED ORDER — IPRATROPIUM-ALBUTEROL 20-100 MCG/ACT IN AERS: 2.0000 | INHALATION_SPRAY | Freq: Four times a day (QID) | RESPIRATORY_TRACT | Status: DC | PRN

## 2017-07-16 MED ORDER — MOMETASONE FURO-FORMOTEROL FUM 200-5 MCG/ACT IN AERO: 2.0000 | INHALATION_SPRAY | Freq: Two times a day (BID) | RESPIRATORY_TRACT | Status: DC

## 2017-07-16 MED ORDER — DEXTROSE 5 % IV SOLN
2.0000 g | INTRAVENOUS | Status: DC
  Administered 2017-07-16: 2 g via INTRAVENOUS
  Filled 2017-07-16 (×2): qty 2

## 2017-07-16 MED ORDER — OXYCODONE HCL 5 MG PO TABS
10.0000 mg | ORAL_TABLET | ORAL | Status: DC | PRN
  Administered 2017-07-16 – 2017-07-17 (×6): 10 mg via ORAL
  Filled 2017-07-16 (×6): qty 2

## 2017-07-16 MED ORDER — LEVETIRACETAM 500 MG PO TABS
500.0000 mg | ORAL_TABLET | Freq: Two times a day (BID) | ORAL | Status: DC
  Administered 2017-07-16 (×2): 500 mg via ORAL
  Filled 2017-07-16 (×3): qty 1

## 2017-07-16 MED ORDER — ALBUTEROL SULFATE (2.5 MG/3ML) 0.083% IN NEBU: 2.5000 mg | INHALATION_SOLUTION | Freq: Four times a day (QID) | RESPIRATORY_TRACT | Status: DC | PRN

## 2017-07-16 MED ORDER — PRAVASTATIN SODIUM 20 MG PO TABS
10.0000 mg | ORAL_TABLET | Freq: Every day | ORAL | Status: DC
  Administered 2017-07-16: 10 mg via ORAL
  Filled 2017-07-16 (×2): qty 1

## 2017-07-16 MED ORDER — ALBUTEROL SULFATE HFA 108 (90 BASE) MCG/ACT IN AERS: 2.0000 | INHALATION_SPRAY | Freq: Four times a day (QID) | RESPIRATORY_TRACT | Status: DC | PRN

## 2017-07-16 MED ORDER — CLINDAMYCIN PHOSPHATE 600 MG/50ML IV SOLN
600.0000 mg | Freq: Three times a day (TID) | INTRAVENOUS | Status: DC
  Administered 2017-07-16 – 2017-07-17 (×3): 600 mg via INTRAVENOUS
  Filled 2017-07-16 (×6): qty 50

## 2017-07-16 MED ORDER — TIOTROPIUM BROMIDE MONOHYDRATE 18 MCG IN CAPS
18.0000 ug | ORAL_CAPSULE | Freq: Every day | RESPIRATORY_TRACT | Status: DC | PRN
  Administered 2017-07-16: 18 ug via RESPIRATORY_TRACT
  Filled 2017-07-16: qty 5

## 2017-07-16 MED ORDER — ONDANSETRON HCL 4 MG PO TABS
4.0000 mg | ORAL_TABLET | Freq: Four times a day (QID) | ORAL | Status: DC | PRN
  Filled 2017-07-16: qty 1

## 2017-07-16 MED ORDER — PANCRELIPASE (LIP-PROT-AMYL) 12000-38000 UNITS PO CPEP
24000.0000 [IU] | ORAL_CAPSULE | Freq: Three times a day (TID) | ORAL | Status: DC
  Administered 2017-07-16 (×3): 24000 [IU] via ORAL
  Filled 2017-07-16 (×6): qty 2

## 2017-07-16 MED ORDER — MOMETASONE FURO-FORMOTEROL FUM 200-5 MCG/ACT IN AERO
2.0000 | INHALATION_SPRAY | Freq: Two times a day (BID) | RESPIRATORY_TRACT | Status: DC
  Administered 2017-07-16 – 2017-07-17 (×3): 2 via RESPIRATORY_TRACT
  Filled 2017-07-16: qty 8.8

## 2017-07-16 MED ORDER — OXYCODONE HCL 5 MG PO TABS: 20.0000 mg | ORAL_TABLET | Freq: Three times a day (TID) | ORAL | Status: DC | PRN

## 2017-07-16 MED ORDER — SODIUM CHLORIDE 0.9 % IV SOLN
INTRAVENOUS | Status: DC
  Administered 2017-07-16: 03:00:00 via INTRAVENOUS

## 2017-07-16 MED ORDER — ONDANSETRON HCL 4 MG/2ML IJ SOLN: 4.0000 mg | Freq: Four times a day (QID) | INTRAMUSCULAR | Status: DC | PRN

## 2017-07-16 MED ORDER — ACETAMINOPHEN 650 MG RE SUPP: 650.0000 mg | Freq: Four times a day (QID) | RECTAL | Status: DC | PRN

## 2017-07-16 MED ORDER — ACETAMINOPHEN 325 MG PO TABS: 650.0000 mg | ORAL_TABLET | Freq: Four times a day (QID) | ORAL | Status: DC | PRN

## 2017-07-16 NOTE — Care Management (Signed)
Patient discharged from [facility] 1/8 after stay for uti- then presented back to ED later 1/8 pm with rectal bleeding.  Her hemoglobin has been stable.GI is consulting and for EGD.

## 2017-07-16 NOTE — Progress Notes (Signed)
Md notified of positive antibodies on type and screen.

## 2017-07-16 NOTE — Care Management Obs Status (Signed)
[place] NOTIFICATION   Patient Details  Name: [name] MRN: [id number] Date of Birth: [birth year]   Medicare Observation Status Notification Given:  Yes  Notice signed, one given to patient and the other to HIM for scanning  Katrina Stack, RN 07/16/2017, 4:18 PM

## 2017-07-16 NOTE — Progress Notes (Signed)
Pharmacy Antibiotic Note  [name] is a 62 y.o. female admitted on 07/15/2017 with UTI.  Pharmacy has been consulted for ceftriaxone dosing.  Plan: Ceftriaxone 2 grams q 24 hours ordered.     Temp (24hrs), Avg:97.8 F (36.6 C), Min:97.5 F (36.4 C), Max:98.1 F (36.7 C)  Recent Labs  Lab 07/13/17 2228 07/14/17 0530 07/15/17 0435 07/15/17 2130  WBC 4.9  --   --  3.1*  CREATININE 0.93 0.65 0.76 0.94    Estimated Creatinine Clearance: 87.4 mL/min (by C-G formula based on SCr of 0.94 mg/dL).    Allergies  Allergen Reactions  . Ibuprofen Other (See Comments)    Pt states that she is unable to take this medication because of her liver.    . Sulfa Antibiotics Hives  . Hydroxyzine Other (See Comments)    Causes crying   . Amoxicillin Itching, Rash and Other (See Comments)    Has patient had a PCN reaction causing immediate rash, facial/tongue/throat swelling, SOB or lightheadedness with hypotension: No Has patient had a PCN reaction causing severe rash involving mucus membranes or skin necrosis: No Has patient had a PCN reaction that required hospitalization No Has patient had a PCN reaction occurring within the last 10 years: Yes If all of the above answers are "NO", then may proceed with Cephalosporin use.  . Chocolate Rash  . Chocolate Flavor Rash  . Ciprofloxacin Itching and Rash  . Dilaudid [Hydromorphone Hcl] Itching  . Fentanyl Rash  . Hydromorphone Itching  . Hydromorphone Hcl Itching  . Metformin Nausea And Vomiting  . Penicillins Itching, Rash and Other (See Comments)    Has patient had a PCN reaction causing immediate rash, facial/tongue/throat swelling, SOB or lightheadedness with hypotension: No Has patient had a PCN reaction causing severe rash involving mucus membranes or skin necrosis: No Has patient had a PCN reaction that required hospitalization No Has patient had a PCN reaction occurring within the last 10 years: Yes If all of the above answers are  "NO", then may proceed with Cephalosporin use.  . Strawberry Extract Rash    Antimicrobials this admission: Ceftriaxone 1/9  >>    >>   Dose adjustments this admission:   Microbiology results: 1/6 UCx: pending  1/7 MRSA PCR: (-)   Thank you for allowing pharmacy to be a part of this patient's care.  Burley Kopka S 07/16/2017 1:23 AM

## 2017-07-16 NOTE — ED Notes (Addendum)
Pt up to toilet, had a large BM that consisted of primarily bright red blood. Unable to note actual stool in toilet.

## 2017-07-16 NOTE — Plan of Care (Signed)
Pt with no signs of active GIB. HGB stable and pt is asymptomatic. Pt frequently c/o pain and requests xanax and phenergan. Pt is arousable but lethargic at this point, will no pursue further sedating drugs. EGD and Colonoscopy in am. Prep to begin this evening.

## 2017-07-16 NOTE — H&P (Signed)
[facility] NAME: [name]    MR#:  [id number]  DATE OF BIRTH:  Nov 05, 1955  DATE OF ADMISSION:  07/15/2017  PRIMARY CARE PHYSICIAN: System, Pcp Not In   REQUESTING/REFERRING PHYSICIAN:   CHIEF COMPLAINT:   Chief Complaint  Patient presents with  . Rectal Bleeding    HISTORY OF PRESENT ILLNESS: [name]  is a 62 y.o. female with a known history of congestive heart failure, diabetes mellitus, emphysema, GERD, hepatitis C, hypertension, chronic kidney disease presented to the emergency room with rectal bleed.  Patient was recently discharged from our hospital on 07/15/2017 after being managed for a GI bleed.  She was planned for endoscopy as outpatient by gastroenterology.  Patient went home and again had bright red blood per rectum which she came to the emergency room.  Her hemoglobin is stable around 11.  No hematemesis noted.  Hospitalist service was consulted.  Patient was being treated for urinary tract infection with oral Keflex and has history of chronic pancreatitis.  PAST MEDICAL HISTORY:   Past Medical History:  Diagnosis Date  . Allergy   . Anemia   . Anxiety   . CHF (congestive heart failure) ([place])   . Chronic bronchitis ([place])   . Chronic kidney disease   . Cirrhosis ([place])   . Depression   . Diabetes mellitus without complication ([place])   . Diverticulitis   . Emphysema of lung ([place])   . GERD (gastroesophageal reflux disease)   . H/O drug abuse    Clean since 2009  . H/O ETOH abuse    Sober since 2009  . Hepatitis C   . History of sepsis 2016  . Hypertension   . Malignant brain tumor ([place]) 2007  . Migraines   . Pancreatic ascites   . Pancreatitis, alcoholic 1601  . Rectovaginal fistula   . Seizures [facility])    Last seizure October 2017  . Spleen enlarged   . Stroke [facility]) 2007, 2008   during brain surgery  . Urine incontinence     PAST SURGICAL HISTORY:  Past Surgical History:  Procedure  Laterality Date  . ABDOMINAL HYSTERECTOMY  1979  . APPENDECTOMY  1999  . BRAIN SURGERY  2007   malignant brain tumor  . CHOLECYSTECTOMY  2001  . COLON SURGERY  2006  . CRANIOPLASTY N/A 09/15/2015   Procedure: Cranioplasty with retrieval of bone flap from abdominal pocket;  Surgeon: Ashok Pall, MD;  Location: [place] NEURO ORS;  Service: Neurosurgery;  Laterality: N/A;  Cranioplasty with retrieval of bone flap from abdominal pocket  . CRANIOTOMY Right 08/16/2015   Procedure: Right Fronto-Temporal-Parietal Craniotomy for Evacuation of Hematoma with Bone Flap Placement in Abdomen;  Surgeon: Ashok Pall, MD;  Location: [place] NEURO ORS;  Service: Neurosurgery;  Laterality: Right;  . CRANIOTOMY N/A 12/28/2015   Procedure: Craniectomy for wound debridement;  Surgeon: Ashok Pall, MD;  Location: [place] NEURO ORS;  Service: Neurosurgery;  Laterality: N/A;  Craniectomy for wound debridement  . DILATION AND CURETTAGE OF UTERUS    . EYE SURGERY    . JOINT REPLACEMENT Left    TOTAL KNEE REPLACEMENT  . rectovaginal fistula repair w/ colostomy  2011  . REPLACEMENT TOTAL KNEE Left 2005    SOCIAL HISTORY:  Social History   Tobacco Use  . Smoking status: Current Every Day Smoker    Packs/day: 1.00    Types: Cigarettes  . Smokeless tobacco: Never Used  Substance Use Topics  .  Alcohol use: Yes    Alcohol/week: 2.4 oz    Types: 4 Cans of beer per week    FAMILY HISTORY:  Family History  Problem Relation Age of Onset  . Hypertension Mother   . Heart disease Father   . Diabetes Father   . Cancer Sister        brain  . Cancer Grandchild 8       brain tumor    DRUG ALLERGIES:  Allergies  Allergen Reactions  . Ibuprofen Other (See Comments)    Pt states that she is unable to take this medication because of her liver.    . Sulfa Antibiotics Hives  . Hydroxyzine Other (See Comments)    Causes crying   . Amoxicillin Itching, Rash and Other (See Comments)    Has patient had a PCN reaction causing  immediate rash, facial/tongue/throat swelling, SOB or lightheadedness with hypotension: No Has patient had a PCN reaction causing severe rash involving mucus membranes or skin necrosis: No Has patient had a PCN reaction that required hospitalization No Has patient had a PCN reaction occurring within the last 10 years: Yes If all of the above answers are "NO", then may proceed with Cephalosporin use.  . Chocolate Rash  . Chocolate Flavor Rash  . Ciprofloxacin Itching and Rash  . Dilaudid [Hydromorphone Hcl] Itching  . Fentanyl Rash  . Hydromorphone Itching  . Hydromorphone Hcl Itching  . Metformin Nausea And Vomiting  . Penicillins Itching, Rash and Other (See Comments)    Has patient had a PCN reaction causing immediate rash, facial/tongue/throat swelling, SOB or lightheadedness with hypotension: No Has patient had a PCN reaction causing severe rash involving mucus membranes or skin necrosis: No Has patient had a PCN reaction that required hospitalization No Has patient had a PCN reaction occurring within the last 10 years: Yes If all of the above answers are "NO", then may proceed with Cephalosporin use.  . Strawberry Extract Rash    REVIEW OF SYSTEMS:   CONSTITUTIONAL: No fever, fatigue or weakness.  EYES: No blurred or double vision.  EARS, NOSE, AND THROAT: No tinnitus or ear pain.  RESPIRATORY: No cough, shortness of breath, wheezing or hemoptysis.  CARDIOVASCULAR: No chest pain, orthopnea, edema.  GASTROINTESTINAL: No nausea, vomiting, diarrhea  mild abdominal pain.  Has rectal bleed GENITOURINARY: No dysuria, hematuria.  ENDOCRINE: No polyuria, nocturia,  HEMATOLOGY: No anemia, easy bruising or bleeding SKIN: No rash or lesion. MUSCULOSKELETAL: No joint pain or arthritis.   NEUROLOGIC: No tingling, numbness, weakness.  PSYCHIATRY: No anxiety or depression.   MEDICATIONS AT HOME:  Prior to Admission medications   Medication Sig Start Date End Date Taking? Authorizing  Provider  albuterol (PROVENTIL HFA;VENTOLIN HFA) 108 (90 Base) MCG/ACT inhaler Inhale 2 puffs into the lungs every 6 (six) hours as needed for wheezing or shortness of breath.   Yes [provider]  albuterol (PROVENTIL) (5 MG/ML) 0.5% nebulizer solution Take 2.5 mg by nebulization every 6 (six) hours as needed for wheezing or shortness of breath.    Yes [provider]  ALPRAZolam Duanne Moron) 1 MG tablet Take 1 tablet (1 mg total) by mouth 2 (two) times daily as needed for anxiety. Patient taking differently: Take 1 mg by mouth at bedtime as needed.  03/14/16  Yes Rubie Maid, MD  amitriptyline (ELAVIL) 50 MG tablet Take 50 mg by mouth at bedtime.   Yes [provider]  amLODipine (NORVASC) 10 MG tablet Take 10 mg by mouth daily.  Yes [provider]  cephALEXin (KEFLEX) 500 MG capsule Take 1 capsule (500 mg total) by mouth 3 (three) times daily. 07/15/17  Yes Demetrios Loll, MD  diphenhydrAMINE (BENADRYL) 25 mg capsule Take 1 capsule (25 mg total) by mouth every 8 (eight) hours as needed for itching. 10/25/15  Yes Vaughan Basta, MD  Fluticasone-Salmeterol (ADVAIR) 250-50 MCG/DOSE AEPB Inhale 1 puff into the lungs 2 (two) times daily.    Yes [provider]  furosemide (LASIX) 20 MG tablet Take 20 mg by mouth every morning.    Yes [provider]  gabapentin (NEURONTIN) 300 MG capsule Take 600 mg by mouth 4 (four) times daily.  12/11/15  Yes [provider]  hydrOXYzine (ATARAX/VISTARIL) 25 MG tablet Take 1 tablet by mouth  every 6 hours as needed 03/08/16  Yes [provider]  Ipratropium-Albuterol (COMBIVENT) 20-100 MCG/ACT AERS respimat Inhale 2 puffs into the lungs every 6 (six) hours as needed for wheezing or shortness of breath.    Yes [provider]  levETIRAcetam (KEPPRA) 500 MG tablet Take 500 mg by mouth 2 (two) times daily.  10/18/15  Yes [provider]  lipase/protease/amylase (CREON) 12000 units CPEP  capsule Take 24,000 Units by mouth 3 (three) times daily with meals.   Yes [provider]  mometasone-formoterol (DULERA) 200-5 MCG/ACT AERO Inhale 2 puffs into the lungs 2 (two) times daily.   Yes [provider]  omeprazole (PRILOSEC) 20 MG capsule Take 20 mg by mouth every morning.  11/29/15  Yes [provider]  Oxycodone HCl 20 MG TABS Take 20 mg by mouth 3 (three) times daily as needed (pain).   Yes [provider]  phenytoin (DILANTIN) 100 MG/4ML suspension Take 300 mg by mouth 2 (two) times daily. Takes 78m by mouth bid   Yes [provider]  pravastatin (PRAVACHOL) 10 MG tablet Take 10 mg by mouth daily.   Yes [provider]  prochlorperazine (COMPAZINE) 10 MG tablet Take 10 mg by mouth every 12 (twelve) hours as needed for nausea or vomiting.   Yes [provider]  promethazine (PHENERGAN) 25 MG tablet Take 25 mg by mouth every 8 (eight) hours as needed for nausea or vomiting.   Yes [provider]  sertraline (ZOLOFT) 100 MG tablet Take 200 mg by mouth daily.   Yes [provider]  spironolactone (ALDACTONE) 50 MG tablet Take 1 tablet (50 mg total) by mouth 2 (two) times daily. 10/25/15  Yes VVaughan Basta MD  sucralfate (CARAFATE) 1 g tablet Take 1 g by mouth 4 (four) times daily.   Yes [provider]  tiotropium (SPIRIVA) 18 MCG inhalation capsule Place 18 mcg into inhaler and inhale daily as needed (shortness of breath).    Yes [provider]  traMADol (ULTRAM) 50 MG tablet Take 1 tablet (50 mg total) by mouth every 6 (six) hours as needed for moderate pain. 06/02/16  Yes SSable Feil PA-C  triamcinolone cream (KENALOG) 0.1 % apply to affected area twice a day 02/07/16  Yes [provider]  zolpidem (AMBIEN) 5 MG tablet Take 1 tablet (5 mg total) by mouth at bedtime as needed for sleep. 02/28/16  Yes MBettey Costa MD  Blood Glucose Monitoring Suppl (FIFTY50 GLUCOSE METER  2.0) w/Device KIT ICD-10 E11.9 Check blood sugars once daily 09/04/15   [provider]  brompheniramine-pseudoephedrine-DM 30-2-10 MG/5ML syrup Take 5 mLs by mouth 4 (four) times daily as needed. Patient not taking: Reported on 07/14/2017 06/02/16  Sable Feil, PA-C  cyproheptadine (PERIACTIN) 4 MG tablet Take 1 tablet (4 mg total) by mouth 3 (three) times daily as needed for allergies. Patient not taking: Reported on 07/14/2017 06/02/16   Sable Feil, PA-C  triamcinolone (KENALOG) 0.025 % ointment Apply 1 application topically 2 (two) times daily. Patient not taking: Reported on 07/14/2017 03/17/16   Rubie Maid, MD      PHYSICAL EXAMINATION:   VITAL SIGNS: Blood pressure (!) 137/99, pulse 88, temperature 98.1 F (36.7 C), temperature source Oral, resp. rate 18, SpO2 100 %.  GENERAL:  62 y.o.-year-old patient lying in the bed with no acute distress.  EYES: Pupils equal, round, reactive to light and accommodation. No scleral icterus. Extraocular muscles intact.  HEENT: Head atraumatic, normocephalic. Oropharynx and nasopharynx clear.  NECK:  Supple, no jugular venous distention. No thyroid enlargement, no tenderness.  LUNGS: Normal breath sounds bilaterally, no wheezing, rales,rhonchi or crepitation. No use of accessory muscles of respiration.  CARDIOVASCULAR: S1, S2 normal. No murmurs, rubs, or gallops.  ABDOMEN: Soft, mild tenderness around umbilicus, nondistended. Bowel sounds present. No organomegaly or mass.  EXTREMITIES: No pedal edema, cyanosis, or clubbing.  NEUROLOGIC: Cranial nerves II through XII are intact. Muscle strength 5/5 in all extremities. Sensation intact. Gait not checked.  PSYCHIATRIC: The patient is alert and oriented x 3.  SKIN: No obvious rash, lesion, or ulcer.   LABORATORY PANEL:   CBC Recent Labs  Lab 07/13/17 2228 07/14/17 0530 07/14/17 2131 07/15/17 0908 07/15/17 2130  WBC 4.9  --   --   --  3.1*  HGB 11.6* 10.9* 10.9* 10.5* 11.2*  HCT  36.2 32.9* 33.8* 32.9* 35.2  PLT 193  --   --   --  166  MCV 85.9  --   --   --  86.6  MCH 27.7  --   --   --  27.5  MCHC 32.2  --   --   --  31.8*  RDW 16.5*  --   --   --  16.7*  LYMPHSABS  --   --   --   --  0.9*  MONOABS  --   --   --   --  0.3  EOSABS  --   --   --   --  0.1  BASOSABS  --   --   --   --  0.0   ------------------------------------------------------------------------------------------------------------------  Chemistries  Recent Labs  Lab 07/13/17 2228 07/14/17 0530 07/15/17 0435 07/15/17 2130  NA 137 138 141 139  K 3.2* 3.1* 3.8 3.5  CL 104 108 110 108  CO2 _0 GLUCOSE 120* 104* 84 128*  BUN _1 CREATININE 0.93 0.65 0.76 0.94  CALCIUM 8.5* 8.4* 8.4* 8.6*  MG  --  2.0  --   --   AST 37  --   --  32  ALT 25  --   --  20  ALKPHOS 98  --   --  89  BILITOT 0.8  --   --  0.8   ------------------------------------------------------------------------------------------------------------------ estimated creatinine clearance is 87.4 mL/min (by C-G formula based on SCr of 0.94 mg/dL). ------------------------------------------------------------------------------------------------------------------ No results for input(s): TSH, T4TOTAL, T3FREE, THYROIDAB in the last 72 hours.  Invalid input(s): FREET3   Coagulation profile Recent Labs  Lab 07/13/17 2228  INR 1.42   ------------------------------------------------------------------------------------------------------------------- No results for input(s): DDIMER in the last 72 hours. -------------------------------------------------------------------------------------------------------------------  Cardiac Enzymes Recent Labs  Lab 07/13/17  [place] <0.03   ------------------------------------------------------------------------------------------------------------------ Invalid input(s):  POCBNP  ---------------------------------------------------------------------------------------------------------------  Urinalysis    Component Value Date/Time   COLORURINE AMBER (A) 07/13/2017 2229   APPEARANCEUR CLOUDY (A) 07/13/2017 2229   APPEARANCEUR Hazy 10/16/2014 0605   LABSPEC 1.005 07/13/2017 2229   LABSPEC 1.006 10/16/2014 0605   PHURINE 6.0 07/13/2017 2229   GLUCOSEU NEGATIVE 07/13/2017 2229   GLUCOSEU Negative 10/16/2014 0605   HGBUR LARGE (A) 07/13/2017 2229   BILIRUBINUR NEGATIVE 07/13/2017 2229   BILIRUBINUR NEGATIVE 11/15/2015 1417   BILIRUBINUR Negative 10/16/2014 0605   KETONESUR NEGATIVE 07/13/2017 2229   PROTEINUR NEGATIVE 07/13/2017 2229   UROBILINOGEN negative 11/15/2015 1417   UROBILINOGEN 2.0 (H) 06/07/2014 1916   NITRITE POSITIVE (A) 07/13/2017 2229   LEUKOCYTESUR MODERATE (A) 07/13/2017 2229   LEUKOCYTESUR Negative 10/16/2014 0605     RADIOLOGY: Ct Abdomen Pelvis W Contrast  Result Date: 07/14/2017 CLINICAL DATA:  62 y/o F; history of appendectomy, cholecystectomy, and colostomy with reversal. Blood in stool or urine. EXAM: CT ABDOMEN AND PELVIS WITH CONTRAST TECHNIQUE: Multidetector CT imaging of the abdomen and pelvis was performed using the standard protocol following bolus administration of intravenous contrast. CONTRAST:  125m ISOVUE-300 IOPAMIDOL (ISOVUE-300) INJECTION 61% COMPARISON:  03/21/2016 and 08/30/2014 CT abdomen and pelvis. FINDINGS: Lower chest: No acute abnormality. Hepatobiliary: Cirrhotic liver. No focal liver abnormality is seen. Status post cholecystectomy. No biliary dilatation. Pancreas: Unremarkable. No pancreatic ductal dilatation or surrounding inflammatory changes. Spleen: Spleen measuring 16.4 x 5.5 x 14.0 cm (volume = 660 cm^3). Adrenals/Urinary Tract: Stable 16 mm nodule within the left adrenal gland (series 2, image 27) from 2016, likely adenoma. Stomach/Bowel: Stomach is within normal limits. Widely patent colorectal  anastomosis. No evidence of bowel wall thickening, distention, or inflammatory changes. Vascular/Lymphatic: Aortic atherosclerosis. No enlarged abdominal or pelvic lymph nodes. Reproductive: Status post hysterectomy. No adnexal masses. Other: No abdominal wall hernia or abnormality. No abdominopelvic ascites. Musculoskeletal: No acute or significant osseous findings. IMPRESSION: 1. No acute process identified. 2. Cirrhotic liver. 3. Splenomegaly, volume 660 cc. 4. Stable left adrenal nodule from 2016, likely adenoma. 5. Aortic atherosclerosis. Electronically Signed   By: LKristine GarbeM.D.   On: 07/14/2017 02:27    EKG: Orders placed or performed during the hospital encounter of 07/15/17  . EKG 12-Lead  . EKG 12-Lead    IMPRESSION AND PLAN: 62year old female patient with history of diabetes mellitus, hypertension, congestive heart failure, cirrhosis of liver, emphysema presented to the emergency room with bright red blood per rectum.  Admitting diagnosis 1.  Acute gastrointestinal bleeding 2.  Urinary tract infection 3.  Hypertension 4.  Diabetes mellitus 5.  Cirrhosis of liver 6.  Chronic pancreatitis  treatment plan  admit patient to medical floor Serial hemoglobin hematocrit monitoring Gastroenterology consultation IV Protonix 40 mg every 12 hourly Endoscopy and colonoscopy as per gastroenterology  All the records are reviewed and case discussed with ED provider. Management plans discussed with the patient, family and they are in agreement.  CODE STATUS:FULL CODE Code Status History    Date Active Date Inactive Code Status Order ID Comments User Context   07/14/2017 05:03 07/15/2017 15:16 Full Code [id number] PSaundra Shelling MD Inpatient   02/24/2016 20:03 02/26/2016 17:20 Full Code [id number] SIdelle Crouch MD Inpatient   12/26/2015 13:00 01/05/2016 21:42 Full Code [id number] CAshok Pall MD Inpatient   10/22/2015 04:26 10/25/2015 18:52 Full Code [id number] PSaundra Shelling MD ED   08/16/2015 21:36 08/30/2015 18:11 Full Code  [id number]  Ashok Pall, MD Inpatient   06/22/2015 01:45 06/22/2015 18:09 Full Code [id number]  Lytle Butte, MD ED   05/16/2015 12:00 05/16/2015 18:17 DNR [id number]  Max Sane, MD Inpatient   05/15/2015 01:10 05/16/2015 12:00 Full Code [id number]  Harrie Foreman, MD Inpatient   11/05/2014 12:35 11/05/2014 15:55 Full Code [id number]  Penne Lash, RN Inpatient   11/05/2014 12:17 11/05/2014 12:35 Full Code [id number]  Penne Lash, RN Inpatient       TOTAL TIME TAKING CARE OF THIS PATIENT: 50 minutes.    Saundra Shelling M.D on 07/16/2017 at 1:09 AM  Between 7am to 6pm - Pager - [phone]  After 6pm go to [email or web address] - password [email or web address] [facility]  Office  [phone]  CC: Primary care physician; System, Pcp Not In

## 2017-07-16 NOTE — Progress Notes (Signed)
Patient ID: Corinne Ports, female   DOB: 17-Nov-1955, 62 y.o.   MRN: [id number]  [facility] PROGRESS NOTE  RHEDA KASSAB XNA:[id number] DOB: 1955/08/31 DOA: 07/15/2017 PCP: System, Pcp Not In  HPI/Subjective:   Objective: Vitals:   07/16/17 0153 07/16/17 1205  BP: 114/83 107/72  Pulse: 86 88  Resp: 20 16  Temp: 97.7 F (36.5 C) 97.6 F (36.4 C)  SpO2: 100% 97%    Filed Weights   07/16/17 0151  Weight: 125.7 kg (277 lb 1.6 oz)    ROS: Review of Systems  Constitutional: Negative for chills and fever.  Eyes: Negative for blurred vision.  Respiratory: Positive for cough and shortness of breath.   Cardiovascular: Negative for chest pain.  Gastrointestinal: Positive for melena and nausea. Negative for abdominal pain, constipation, diarrhea and vomiting.  Genitourinary: Negative for dysuria.  Musculoskeletal: Negative for joint pain.  Neurological: Negative for dizziness and headaches.   Exam: Physical Exam  Constitutional: She is oriented to person, place, and time.  HENT:  Nose: No mucosal edema.  Mouth/Throat: No oropharyngeal exudate or posterior oropharyngeal edema.  Eyes: Conjunctivae, EOM and lids are normal. Pupils are equal, round, and reactive to light.  Neck: No JVD present. Carotid bruit is not present. No edema present. No thyroid mass and no thyromegaly present.  Cardiovascular: S1 normal and S2 normal. Exam reveals no gallop.  No murmur heard. Pulses:      Dorsalis pedis pulses are 2+ on the right side, and 2+ on the left side.  Respiratory: No respiratory distress. She has no wheezes. She has rhonchi in the right upper field, the right lower field and the left lower field. She has no rales.  GI: Soft. Bowel sounds are normal. There is no tenderness.  Musculoskeletal:       Right ankle: She exhibits swelling.       Left ankle: She exhibits swelling.  Lymphadenopathy:    She has no cervical adenopathy.  Neurological: She is alert and oriented to person,  place, and time. No cranial nerve deficit.  Skin: Skin is warm. No rash noted. Nails show no clubbing.  Psychiatric: She has a normal mood and affect.      Data Reviewed: Basic Metabolic Panel: Recent Labs  Lab 07/13/17 2228 07/14/17 0530 07/15/17 0435 07/15/17 2130 07/16/17 0455  NA 137 138 141 139 139  K 3.2* 3.1* 3.8 3.5 3.8  CL 104 108 110 108 111  CO2 24 23 23 23 22   GLUCOSE 120* 104* 84 128* 80  BUN 8 7 6 6  5*  CREATININE 0.93 0.65 0.76 0.94 0.85  CALCIUM 8.5* 8.4* 8.4* 8.6* 8.3*  MG  --  2.0  --   --   --    Liver Function Tests: Recent Labs  Lab 07/13/17 2228 07/15/17 2130  AST 37 32  ALT 25 20  ALKPHOS 98 89  BILITOT 0.8 0.8  PROT 7.7 7.1  ALBUMIN 3.0* 2.9*   Recent Labs  Lab 07/13/17 2228 07/15/17 2130  LIPASE 150* 68*   CBC: Recent Labs  Lab 07/13/17 2228  07/15/17 0908 07/15/17 2130 07/16/17 0152 07/16/17 0859 07/16/17 1412  WBC 4.9  --   --  3.1*  --   --   --   NEUTROABS  --   --   --  1.8  --   --   --   HGB 11.6*   < > 10.5* 11.2* 11.0* 10.1* 10.6*  HCT 36.2   < >  32.9* 35.2 34.9* 31.7* 33.6*  MCV 85.9  --   --  86.6  --   --   --   PLT 193  --   --  166  --   --   --    < > = values in this interval not displayed.   Cardiac Enzymes: Recent Labs  Lab 07/13/17 2228  TROPONINI <0.03    CBG: Recent Labs  Lab 07/14/17 1130 07/14/17 1631 07/14/17 2100 07/15/17 0504 07/15/17 0721  GLUCAP 93 81 181* 76 119*    Recent Results (from the past 240 hour(s))  Urine Culture     Status: Abnormal (Preliminary result)   Collection Time: 07/13/17 10:29 PM  Result Value Ref Range Status   Specimen Description   Final    URINE, RANDOM Performed at [facility], [address] [place], [place] [address]    Special Requests   Final    NONE Performed at [facility], [address] [place], [place] [address]    Culture >=100,000 COLONIES/mL ESCHERICHIA COLI (A)  Final   Report Status PENDING  Incomplete  MRSA  PCR Screening     Status: None   Collection Time: 07/14/17  5:16 AM  Result Value Ref Range Status   MRSA by PCR NEGATIVE NEGATIVE Final    Comment:        The GeneXpert MRSA Assay (FDA approved for NASAL specimens only), is one component of a comprehensive MRSA colonization surveillance program. It is not intended to diagnose MRSA infection nor to guide or monitor treatment for MRSA infections. Performed at [facility], [address] [place], [place] [address]      Studies: Dg Chest [facility] 1 View  Result Date: 07/16/2017 CLINICAL DATA:  Cough, CHF.  History of bronchitis, substance abuse. EXAM: PORTABLE CHEST 1 VIEW COMPARISON:  Chest radiograph July 13, 2017 FINDINGS: Cardiomediastinal silhouette is normal considering rotation to the RIGHT. Calcified aortic arch. Patchy RIGHT mid and lower lung zone peripheral airspace opacity with bibasilar strandy densities. Bronchitic changes. No pleural effusion. No pneumothorax. Large body habitus. Osseous structures are nonacute. IMPRESSION: Patchy RIGHT mid and lower lung zone airspace opacity concerning for pneumonia. Followup PA and lateral chest X-ray is recommended in 3-4 weeks following trial of antibiotic therapy to ensure resolution and exclude underlying malignancy. COPD/chronic bronchitic changes. Aortic Atherosclerosis (ICD10-I70.0) and Emphysema (ICD10-J43.9). Electronically Signed   By: Elon Alas M.D.   On: 07/16/2017 13:36    Scheduled Meds: . gabapentin  600 mg Oral QID  . ipratropium-albuterol  3 mL Nebulization Q6H  . levETIRAcetam  500 mg Oral BID  . lipase/protease/amylase  24,000 Units Oral TID WC  . mometasone-formoterol  2 puff Inhalation BID  . pantoprazole (PROTONIX) IV  40 mg Intravenous Q12H  . phenytoin  300 mg Oral BID  . polyethylene glycol-electrolytes  4,000 mL Oral Once  . pravastatin  10 mg Oral Daily   Continuous Infusions: . clindamycin (CLEOCIN) IV      Assessment/Plan:  1. Acute  blood loss anemia and GI bleed.  Gastroenterology to do an EGD for variceal surveillance and colonoscopy to evaluate for source of bleeding.  The patient is on IV Protonix twice daily.  Serial hemoglobins.  Clear liquid diet and n.p.o. after midnight. 2. Right upper lobe pneumonia likely aspiration.  Switched antibiotics to clindamycin. 3. Neuropathy on gabapentin 4. Hyperlipidemia on pravastatin 5. History of cirrhosis 6. History of pancreatitis on pancreatic enzymes 7. Recent UTI.  Growing  E. coli.  Received Rocephin while here in the hospital.  Follow-up sensitivities on this.  Clindamycin still has some coverage in the urine. 8. Nonambulatory  Code Status:     Code Status Orders  (From admission, onward)        Start     Ordered   07/16/17 0141  Full code  Continuous     07/16/17 0140    Code Status History    Date Active Date Inactive Code Status Order ID Comments User Context   07/14/2017 05:03 07/15/2017 15:16 Full Code [id number]  Saundra Shelling, MD Inpatient   02/24/2016 20:03 02/26/2016 17:20 Full Code [id number]  Idelle Crouch, MD Inpatient   12/26/2015 13:00 01/05/2016 21:42 Full Code [id number]  Ashok Pall, MD Inpatient   10/22/2015 04:26 10/25/2015 18:52 Full Code [id number]  Saundra Shelling, MD ED   08/16/2015 21:36 08/30/2015 18:11 Full Code [id number]  Ashok Pall, MD Inpatient   06/22/2015 01:45 06/22/2015 18:09 Full Code [id number]  Lytle Butte, MD ED   05/16/2015 12:00 05/16/2015 18:17 DNR [id number]  Max Sane, MD Inpatient   05/15/2015 01:10 05/16/2015 12:00 Full Code [id number]  Harrie Foreman, MD Inpatient   11/05/2014 12:35 11/05/2014 15:55 Full Code [id number]  Penne Lash, RN Inpatient   11/05/2014 12:17 11/05/2014 12:35 Full Code [id number]  Penne Lash, RN Inpatient     Family Communication: Permission to speak in front of family at the bedside Disposition Plan: To be determined based on hemoglobin and results of endoscopy and  colonoscopy  Consultants:  Gastroenterology  Antibiotics:  Clindamycin  Time spent: 35 minutes  [place]

## 2017-07-16 NOTE — Consult Note (Signed)
Jaime Darby, MD [address]  [place]  [place], [place] [address]  Main: [phone]  Fax: [phone] Pager: [phone]   Consultation  Referring Provider:     No ref. provider found Primary Care Physician:  System, [place] Not In Primary Gastroenterologist:      none  Reason for Consultation:     rectal bleeding  Date of Admission:  07/15/2017 Date of Consultation:  07/16/2017         HPI:   [name] is a 62 y.o. female with multiple comorbidities as listed below, significant for cirrhosis, well compensated, secondary to hepatitis C status post SVR after treatment with harvoni, CHF who initially presented on 07/13/2017 with several episodes of rectal bleeding. Given that her hemoglobin was stable overall, no further GI workup was pursued based on GI recommendations and patient was discharged yesterday. However, she got readmitted due to ongoing rectal bleeding. Patient reports her last episode was today. Her hemoglobin has been at baseline during this admission. She is asking for food. She denies abdominal pain, nausea, vomiting,  hematemesis, melena or constipation or diarrhea. Her most recent colonoscopy was in 2016 at [facility] which was unremarkable. She has prior history of rectovaginal fistula of unclear etiology, underwent temporary colostomy followed by colorectal anastomosis. 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 She denies any upper or lower GI symptoms otherwise. GI is reconsulted for further management.  NSAIDs: None  Antiplts/Anticoagulants/Anti thrombotics: none   GI Procedures:  EGD in 2014 which revealed normal esophagus and stomach, small duodenal polyp.  Diagnosis:  DUODENAL POLYP COLD BIOPSY:  - DUODENAL MUCOSA WITH BRUNNER GLAND HYPERPLASIA, ACTIVE  INFLAMMATION AND FEATURES SUGGESTIVE OF PEPTIC DUODENITIS.  - NEGATIVE FOR DYSPLASIA AND MALIGNANCY.   EGD in 11/2013  Diagnosis:  GASTRIC BIOPSY:  - OXYNTIC MUCOSA  WITH MILD REACTIVE GASTROPATHY.  - NO ACTIVE INFLAMMATION, INTESTINAL METAPLASIA, OR ATROPHY.  - NO DYSPLASIA OR MALIGNANCY.  - NO HELICOBACTER PYLORI IDENTIFIED IN HEMATOXYLIN AND EOSIN  SECTIONS.   [facility]GD in 04/2015 [facility] - Grade I esophageal varices.  - Bilious gastric fluid.  - Portal hypertensive gastropathy.  - A single duodenal bulb polyp. Biopsied. 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  - NEGATIVE FOR HIGH GRADE DYSPLASIA AND MALIGNANCY.   Colonoscopy at [facility] in 03/2015 Impression:- The entire examined colon is normal.  - The ileocecal valve is normal.  - No specimens collected.  - Congested mucosa in the rectum. Thinning of the   rectovaginal septum on bimanual exam but no definite   rectovaginal fistula detected.  - The rectum is normal.   Past Medical History:  Diagnosis Date  . Allergy   . Anemia   . Anxiety   . CHF (congestive heart failure) ([place])   . Chronic bronchitis ([address] [address])   . Chronic kidney disease   . Cirrhosis ([place])   . Depression   . Diabetes mellitus without complication ([place])   . Diverticulitis   . Emphysema of lung ([place])   . GERD (gastroesophageal reflux disease)   . H/O drug abuse    Clean since 2009  . H/O ETOH abuse    Sober since 2009  . Hepatitis C   . History of sepsis 2016  .  Hypertension   . Malignant brain tumor ([place]) 2007  . Migraines   . Pancreatic ascites   . Pancreatitis, alcoholic 3244  . Rectovaginal fistula   . Seizures [facility])    Last seizure October 2017  . Spleen enlarged   . Stroke [facility]) 2007, 2008   during  brain surgery  . Urine incontinence     Past Surgical History:  Procedure Laterality Date  . ABDOMINAL HYSTERECTOMY  1979  . APPENDECTOMY  1999  . BRAIN SURGERY  2007   malignant brain tumor  . CHOLECYSTECTOMY  2001  . COLON SURGERY  2006  . CRANIOPLASTY N/A 09/15/2015   Procedure: Cranioplasty with retrieval of bone flap from abdominal pocket;  Surgeon: Ashok Pall, MD;  Location: [place] NEURO ORS;  Service: Neurosurgery;  Laterality: N/A;  Cranioplasty with retrieval of bone flap from abdominal pocket  . CRANIOTOMY Right 08/16/2015   Procedure: Right Fronto-Temporal-Parietal Craniotomy for Evacuation of Hematoma with Bone Flap Placement in Abdomen;  Surgeon: Ashok Pall, MD;  Location: [place] NEURO ORS;  Service: Neurosurgery;  Laterality: Right;  . CRANIOTOMY N/A 12/28/2015   Procedure: Craniectomy for wound debridement;  Surgeon: Ashok Pall, MD;  Location: [place] NEURO ORS;  Service: Neurosurgery;  Laterality: N/A;  Craniectomy for wound debridement  . DILATION AND CURETTAGE OF UTERUS    . EYE SURGERY    . JOINT REPLACEMENT Left    TOTAL KNEE REPLACEMENT  . rectovaginal fistula repair w/ colostomy  2011  . REPLACEMENT TOTAL KNEE Left 2005    Prior to Admission medications   Medication Sig Start Date End Date Taking? Authorizing Provider  albuterol (PROVENTIL HFA;VENTOLIN HFA) 108 (90 Base) MCG/ACT inhaler Inhale 2 puffs into the lungs every 6 (six) hours as needed for wheezing or shortness of breath.   Yes [provider]  albuterol (PROVENTIL) (5 MG/ML) 0.5% nebulizer solution Take 2.5 mg by nebulization every 6 (six) hours as needed for wheezing or shortness of breath.    Yes [provider]  ALPRAZolam Duanne Moron) 1 MG tablet Take 1 tablet (1 mg total) by mouth 2 (two) times daily as needed for anxiety. Patient taking differently: Take 1 mg by mouth at bedtime as needed.  03/14/16  Yes Rubie Maid, MD  amitriptyline (ELAVIL) 50 MG tablet Take 50 mg by mouth at bedtime.   Yes  [provider]  amLODipine (NORVASC) 10 MG tablet Take 10 mg by mouth daily.   Yes [provider]  diphenhydrAMINE (BENADRYL) 25 mg capsule Take 1 capsule (25 mg total) by mouth every 8 (eight) hours as needed for itching. 10/25/15  Yes Vaughan Basta, MD  Fluticasone-Salmeterol (ADVAIR) 250-50 MCG/DOSE AEPB Inhale 1 puff into the lungs 2 (two) times daily.    Yes [provider]  gabapentin (NEURONTIN) 300 MG capsule Take 600 mg by mouth 4 (four) times daily.  12/11/15  Yes [provider]  Ipratropium-Albuterol (COMBIVENT) 20-100 MCG/ACT AERS respimat Inhale 2 puffs into the lungs every 6 (six) hours as needed for wheezing or shortness of breath.    Yes [provider]  levETIRAcetam (KEPPRA) 500 MG tablet Take 500 mg by mouth 2 (two) times daily.  10/18/15  Yes [provider]  lipase/protease/amylase (CREON) 12000 units CPEP capsule Take 24,000 Units by mouth 3 (three) times daily with meals.   Yes [provider]  Oxycodone HCl 20 MG TABS Take 20 mg by mouth 3 (three) times daily as needed (pain).   Yes [provider]  phenytoin (DILANTIN)  100 MG/4ML suspension Take 300 mg by mouth 2 (two) times daily. Takes 69m by mouth bid   Yes [provider]  pravastatin (PRAVACHOL) 10 MG tablet Take 10 mg by mouth daily.   Yes [provider]  prochlorperazine (COMPAZINE) 10 MG tablet Take 10 mg by mouth every 12 (twelve) hours as needed for nausea or vomiting.   Yes [provider]  promethazine (PHENERGAN) 25 MG tablet Take 25 mg by mouth every 8 (eight) hours as needed for nausea or vomiting.   Yes [provider]  spironolactone (ALDACTONE) 50 MG tablet Take 1 tablet (50 mg total) by mouth 2 (two) times daily. 10/25/15  Yes VVaughan Basta MD  sucralfate (CARAFATE) 1 g tablet Take 1 g by mouth 4 (four) times daily.   Yes [provider]  tiotropium (SPIRIVA) 18 MCG  inhalation capsule Place 18 mcg into inhaler and inhale daily as needed (shortness of breath).    Yes [provider]  triamcinolone cream (KENALOG) 0.1 % apply to affected area twice a day 02/07/16  Yes [provider]  zolpidem (AMBIEN) 5 MG tablet Take 1 tablet (5 mg total) by mouth at bedtime as needed for sleep. 02/28/16  Yes MBettey Costa MD  Blood Glucose Monitoring Suppl (FIFTY50 GLUCOSE METER 2.0) w/Device KIT ICD-10 E11.9 Check blood sugars once daily 09/04/15   [provider]  brompheniramine-pseudoephedrine-DM 30-2-10 MG/5ML syrup Take 5 mLs by mouth 4 (four) times daily as needed. Patient not taking: Reported on 07/14/2017 06/02/16   SSable Feil PA-C  cyproheptadine (PERIACTIN) 4 MG tablet Take 1 tablet (4 mg total) by mouth 3 (three) times daily as needed for allergies. Patient not taking: Reported on 07/14/2017 06/02/16   SSable Feil PA-C  furosemide (LASIX) 20 MG tablet Take 20 mg by mouth every morning.     [provider]  hydrOXYzine (ATARAX/VISTARIL) 25 MG tablet Take 1 tablet by mouth  every 6 hours as needed 03/08/16   [provider]  mometasone-formoterol (DULERA) 200-5 MCG/ACT AERO Inhale 2 puffs into the lungs 2 (two) times daily.    [provider]  omeprazole (PRILOSEC) 20 MG capsule Take 20 mg by mouth every morning.  11/29/15   [provider]  sertraline (ZOLOFT) 100 MG tablet Take 200 mg by mouth daily.    [provider]  traMADol (ULTRAM) 50 MG tablet take 1 tablet by mouth once daily for severe pain 02/14/16   [provider]  traMADol (ULTRAM) 50 MG tablet Take 1 tablet (50 mg total) by mouth every 6 (six) hours as needed for moderate pain. Patient not taking: Reported on 07/14/2017 06/02/16   SSable Feil PA-C  triamcinolone (KENALOG) 0.025 % ointment Apply 1 application topically 2 (two) times daily. Patient not taking: Reported on 07/14/2017 03/17/16   CRubie Maid MD    Family  History  Problem Relation Age of Onset  . Hypertension Mother   . Heart disease Father   . Diabetes Father   . Cancer Sister        brain  . Cancer Grandchild 8       brain tumor     Social History   Tobacco Use  . Smoking status: Current Every Day Smoker    Packs/day: 1.00    Types: Cigarettes  . Smokeless tobacco: Never Used  Substance Use Topics  . Alcohol use: Yes    Alcohol/week: 2.4 oz    Types: 4 Cans of beer per week  .  Drug use: Yes    Types: Cocaine    Allergies as of 07/15/2017 - Review Complete 07/15/2017  Allergen Reaction Noted  . Ibuprofen Other (See Comments) 11/04/2014  . Sulfa antibiotics Hives 10/21/2012  . Hydroxyzine Other (See Comments) 01/20/2016  . Amoxicillin Itching, Rash, and Other (See Comments) 02/11/2014  . Chocolate Rash 11/03/2012  . Chocolate flavor Rash 02/18/2013  . Ciprofloxacin Itching and Rash 01/31/2015  . Dilaudid [hydromorphone hcl] Itching 10/21/2012  . Fentanyl Rash 11/04/2014  . Hydromorphone Itching 02/11/2014  . Hydromorphone hcl Itching 10/21/2012  . Metformin Nausea And Vomiting 06/16/2015  . Penicillins Itching, Rash, and Other (See Comments) 10/21/2012  . Strawberry extract Rash 11/03/2012    Review of Systems:    All systems reviewed and negative except where noted in HPI.   Physical Exam:  Vital signs in last 24 hours: Temp:  [97.6 F (36.4 C)-98.1 F (36.7 C)] 97.6 F (36.4 C) (01/09 1205) Pulse Rate:  [86-90] 88 (01/09 1205) Resp:  [16-20] 16 (01/09 1205) BP: (107-137)/(72-99) 107/72 (01/09 1205) SpO2:  [97 %-100 %] 97 % (01/09 1205) Weight:  [277 lb 1.6 oz (125.7 kg)] 277 lb 1.6 oz (125.7 kg) (01/09 0151) Last BM Date: 07/16/17 General:   Pleasant, cooperative in NAD Head:  Normocephalic and atraumatic. Eyes:   No icterus.   Conjunctiva pink. PERRLA. Ears:  Normal auditory acuity. Neck:  Supple; no masses or thyroidomegaly Lungs: Respirations even and unlabored. Lungs clear to auscultation  bilaterally.   No wheezes, crackles, or rhonchi.  Heart:  Regular rate and rhythm;  Without murmur, clicks, rubs or gallops Abdomen:  Soft, nondistended, nontender, obese. Normal bowel sounds. No appreciable masses or hepatomegaly.  No rebound or guarding.  Rectal:  Not performed. Msk:  Symmetrical without gross deformities.  Strength overall weak Extremities:  Without edema, cyanosis or clubbing. Neurologic:  Alert and oriented x3;  grossly normal neurologically. Skin:  Intact without significant lesions or rashes. Cervical Nodes:  No significant cervical adenopathy. Psych:  Alert and cooperative. Normal affect.  LAB RESULTS: CBC Latest Ref Rng & Units 07/16/2017 07/16/2017 07/15/2017  WBC 3.6 - 11.0 K/uL - - 3.1(L)  Hemoglobin 12.0 - 16.0 g/dL 10.1(L) 11.0(L) 11.2(L)  Hematocrit 35.0 - 47.0 % 31.7(L) 34.9(L) 35.2  Platelets 150 - 440 K/uL - - 166    BMET BMP Latest Ref Rng & Units 07/16/2017 07/15/2017 07/15/2017  Glucose 65 - 99 mg/dL 80 128(H) 84  BUN 6 - 20 mg/dL 5(L) 6 6  Creatinine 0.44 - 1.00 mg/dL 0.85 0.94 0.76  Sodium 135 - 145 mmol/L 139 139 141  Potassium 3.5 - 5.1 mmol/L 3.8 3.5 3.8  Chloride 101 - 111 mmol/L 111 108 110  CO2 22 - 32 mmol/L '22 23 23  '$ Calcium 8.9 - 10.3 mg/dL 8.3(L) 8.6(L) 8.4(L)    LFT Hepatic Function Latest Ref Rng & Units 07/15/2017 07/13/2017 03/21/2016  Total Protein 6.5 - 8.1 g/dL 7.1 7.7 8.8(H)  Albumin 3.5 - 5.0 g/dL 2.9(L) 3.0(L) 3.4(L)  AST 15 - 41 U/L 32 37 58(H)  ALT 14 - 54 U/L '20 25 28  '$ Alk Phosphatase 38 - 126 U/L 89 98 297(H)  Total Bilirubin 0.3 - 1.2 mg/dL 0.8 0.8 0.6     STUDIES: Dg Chest Port 1 View  Result Date: 07/16/2017 CLINICAL DATA:  Cough, CHF.  History of bronchitis, substance abuse. EXAM: PORTABLE CHEST 1 VIEW COMPARISON:  Chest radiograph July 13, 2017 FINDINGS: Cardiomediastinal silhouette is normal considering rotation to the RIGHT. Calcified  aortic arch. Patchy RIGHT mid and lower lung zone peripheral airspace opacity with  bibasilar strandy densities. Bronchitic changes. No pleural effusion. No pneumothorax. Large body habitus. Osseous structures are nonacute. IMPRESSION: Patchy RIGHT mid and lower lung zone airspace opacity concerning for pneumonia. Followup PA and lateral chest X-ray is recommended in 3-4 weeks following trial of antibiotic therapy to ensure resolution and exclude underlying malignancy. COPD/chronic bronchitic changes. Aortic Atherosclerosis (ICD10-I70.0) and Emphysema (ICD10-J43.9). Electronically Signed   By: Elon Alas M.D.   On: 07/16/2017 13:36      Impression / Plan:   VERDENE CRESON is a 62 y.o. 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 There is a slight drop in her hemoglobin. Based on her history, this is unlikely upper GI bleed. Most likely outlet bleeding from rectal varices or internal hemorrhoids. She had several EGDs and colonoscopies in the past which are reassuring. Most recent colonoscopy was in 2016. Due to ongoing rectal bleeding, will pursue further GI workup  - Recommend clear liquid diet - Monitor H&H every 12 hours - I will perform EGD for variceal surveillance - I'll perform colonoscopy to evaluate the cause of rectal bleeding.  - Bowel prep today - Nothing by mouth past midnight    Thank you for involving me in the care of this patient. Will continue to follow    LOS: 0 days   Sherri Sear, MD  07/16/2017, 1:48 PM   Note: This dictation was prepared with Dragon dictation along with smaller phrase technology. Any transcriptional errors that result from this process are unintentional.

## 2017-07-17 ENCOUNTER — Observation Stay: Payer: Medicare Other | Admitting: Anesthesiology

## 2017-07-17 ENCOUNTER — Encounter
Admission: EM | Disposition: A | Discharge status: Home / Self Care | Payer: Self-pay | Source: Home / Self Care | Attending: Emergency Medicine

## 2017-07-17 ENCOUNTER — Encounter: Payer: Self-pay | Admitting: *Deleted

## 2017-07-17 DIAGNOSIS — K3189 Other diseases of stomach and duodenum: Secondary | ICD-10-CM

## 2017-07-17 DIAGNOSIS — K766 Portal hypertension: Secondary | ICD-10-CM | POA: Diagnosis not present

## 2017-07-17 DIAGNOSIS — K7469 Other cirrhosis of liver: Secondary | ICD-10-CM | POA: Diagnosis not present

## 2017-07-17 DIAGNOSIS — I8510 Secondary esophageal varices without bleeding: Secondary | ICD-10-CM

## 2017-07-17 DIAGNOSIS — K625 Hemorrhage of anus and rectum: Secondary | ICD-10-CM

## 2017-07-17 DIAGNOSIS — K6389 Other specified diseases of intestine: Secondary | ICD-10-CM | POA: Diagnosis not present

## 2017-07-17 HISTORY — PX: COLONOSCOPY: SHX5424

## 2017-07-17 HISTORY — PX: ESOPHAGOGASTRODUODENOSCOPY: SHX5428

## 2017-07-17 LAB — URINE CULTURE

## 2017-07-17 SURGERY — COLONOSCOPY
Anesthesia: General

## 2017-07-17 MED ORDER — PROPOFOL 10 MG/ML IV BOLUS
INTRAVENOUS | Status: DC | PRN
  Administered 2017-07-17: 100 mg via INTRAVENOUS

## 2017-07-17 MED ORDER — PHENYLEPHRINE HCL 10 MG/ML IJ SOLN
INTRAMUSCULAR | Status: DC | PRN
  Administered 2017-07-17: 100 ug via INTRAVENOUS

## 2017-07-17 MED ORDER — SODIUM CHLORIDE 0.9 % IV SOLN
INTRAVENOUS | Status: DC | PRN
  Administered 2017-07-17: 1000 mL
  Administered 2017-07-17: 11:00:00 via INTRAVENOUS

## 2017-07-17 MED ORDER — LIDOCAINE 2% (20 MG/ML) 5 ML SYRINGE
INTRAMUSCULAR | Status: DC | PRN
  Administered 2017-07-17: 40 mg via INTRAVENOUS

## 2017-07-17 MED ORDER — CEFUROXIME AXETIL 500 MG PO TABS: 500.0000 mg | ORAL_TABLET | Freq: Two times a day (BID) | ORAL | 0 refills | Status: DC

## 2017-07-17 MED ORDER — AZITHROMYCIN 500 MG PO TABS: 500.0000 mg | ORAL_TABLET | Freq: Every day | ORAL | 0 refills | Status: AC

## 2017-07-17 MED ORDER — FENTANYL CITRATE (PF) 100 MCG/2ML IJ SOLN
INTRAMUSCULAR | Status: AC
  Filled 2017-07-17: qty 2

## 2017-07-17 MED ORDER — ALPRAZOLAM 1 MG PO TABS: 1.0000 mg | ORAL_TABLET | Freq: Every evening | ORAL | 0 refills | Status: DC | PRN

## 2017-07-17 MED ORDER — PROPOFOL 500 MG/50ML IV EMUL
INTRAVENOUS | Status: AC
  Filled 2017-07-17: qty 50

## 2017-07-17 MED ORDER — IPRATROPIUM-ALBUTEROL 0.5-2.5 (3) MG/3ML IN SOLN: 3.0000 mL | Freq: Two times a day (BID) | RESPIRATORY_TRACT | Status: DC

## 2017-07-17 MED ORDER — PROPOFOL 500 MG/50ML IV EMUL
INTRAVENOUS | Status: DC | PRN
  Administered 2017-07-17: 160 ug/kg/min via INTRAVENOUS

## 2017-07-17 NOTE — Op Note (Signed)
[facility] ([place]) Gastroenterology Patient Name: [name] Procedure Date: 07/17/2017 10:26 AM MRN: [id number] Account #: [id number] Date of Birth: 1956/01/03 Admit Type: Outpatient Age: 62 Room: [facility] ENDO ROOM 4 Gender: Female Note Status: Finalized Procedure:            Colonoscopy Indications:          Rectal bleeding Providers:            Lin Landsman MD, MD Medicines:            Monitored Anesthesia Care Complications:        No immediate complications. Estimated blood loss:                        Minimal. Procedure:            Pre-Anesthesia Assessment:                       - Prior to the procedure, a History and Physical was                        performed, and patient medications and allergies were                        reviewed. The patient is competent. The risks and                        benefits of the procedure and the sedation options and                        risks were discussed with the patient. All questions                        were answered and informed consent was obtained.                        Patient identification and proposed procedure were                        verified by the physician, the nurse, the                        anesthesiologist, the anesthetist and the technician in                        the pre-procedure area in the procedure room. Mental                        Status Examination: alert and oriented. Airway                        Examination: normal oropharyngeal airway and neck                        mobility. Respiratory Examination: clear to                        auscultation. CV Examination: normal. Prophylactic                        Antibiotics: The patient does not require prophylactic  antibiotics. Prior Anticoagulants: The patient has                        taken no previous anticoagulant or antiplatelet agents.                        ASA Grade Assessment: III - A patient with  severe                        systemic disease. After reviewing the risks and                        benefits, the patient was deemed in satisfactory                        condition to undergo the procedure. The anesthesia plan                        was to use monitored anesthesia care (MAC). Immediately                        prior to administration of medications, the patient was                        re-assessed for adequacy to receive sedatives. The                        heart rate, respiratory rate, oxygen saturations, blood                        pressure, adequacy of pulmonary ventilation, and                        response to care were monitored throughout the                        procedure. The physical status of the patient was                        re-assessed after the procedure.                       After obtaining informed consent, the colonoscope was                        passed under direct vision. Throughout the procedure,                        the patient's blood pressure, pulse, and oxygen                        saturations were monitored continuously. The                        Colonoscope was introduced through the anus and                        advanced to the the cecum, identified by appendiceal  orifice and ileocecal valve. The colonoscopy was                        performed without difficulty. The patient tolerated the                        procedure well. The quality of the bowel preparation                        was fair. Findings:      The perianal and digital rectal examinations were normal. Pertinent       negatives include normal sphincter tone and no palpable rectal lesions.      Patchy areas of mildly congested and erythematous mucosa was found in       the entire colon including rectum, that was bleeding on contact       suggestive of portal colopathy with pt's history of cirrhosis.      Liquid brown stool in entire  colon      Unable to perform retroflexion in rectum, no internal hemorrhoids       appreciated on forward view Impression:           - Preparation of the colon was fair.                       - Congested mucosa in the entire examined colon c/w                        portal colopathy.                       - Rectal bleeding is secondary to portal colopathy                        involving rectum                       - Avoid constipation                       - No further GI workup needed at this time Recommendation:       - Return patient to hospital ward for possible                        discharge same day.                       - Advance diet as tolerated today.                       - Continue present medications.                       - Repeat colonoscopy in 5 years for surveillance.                       - Return to GI clinic in 4 weeks. Procedure Code(s):    --- Professional ---                       [phone], Colonoscopy, flexible; diagnostic, including  collection of specimen(s) by brushing or washing, when                        performed (separate procedure) Diagnosis Code(s):    --- Professional ---                       K63.89, Other specified diseases of intestine                       K62.5, Hemorrhage of anus and rectum CPT copyright 2016 [employer]. All rights reserved. The codes documented in this report are preliminary and upon coder review may  be revised to meet current compliance requirements. Dr. Ulyess Mort Lin Landsman MD, MD 07/17/2017 11:24:53 AM This report has been signed electronically. Number of Addenda: 0 Note Initiated On: 07/17/2017 10:26 AM Scope Withdrawal Time: 0 hours 10 minutes 20 seconds  Total Procedure Duration: 0 hours 13 minutes 40 seconds  Estimated Blood Loss: Minimal      [facility]

## 2017-07-17 NOTE — Anesthesia Preprocedure Evaluation (Addendum)
Anesthesia Evaluation  Patient identified by MRN, date of birth, ID band Patient awake    Reviewed: Allergy & Precautions, H&P , NPO status , Patient's Chart, lab work & pertinent test results, reviewed documented beta blocker date and time   Airway Mallampati: II   Neck ROM: full    Dental  (+) Poor Dentition   Pulmonary neg pulmonary ROS, COPD,  COPD inhaler, Current Smoker,    Pulmonary exam normal        Cardiovascular hypertension, +CHF  negative cardio ROS Normal cardiovascular exam Rhythm:regular Rate:Normal     Neuro/Psych  Headaches, Seizures -,  PSYCHIATRIC DISORDERS CVA negative neurological ROS  negative psych ROS   GI/Hepatic negative GI ROS, Neg liver ROS, GERD  Medicated,(+) Hepatitis -  Endo/Other  negative endocrine ROSdiabetes  Renal/GU Renal diseasenegative Renal ROS  negative genitourinary   Musculoskeletal   Abdominal   Peds  Hematology negative hematology ROS (+) anemia ,   Anesthesia Other Findings Past Medical History: No date: Allergy No date: Anemia No date: Anxiety No date: CHF (congestive heart failure) (HCC) No date: Chronic bronchitis (HCC) No date: Chronic kidney disease No date: Cirrhosis ([place]) No date: Depression No date: Diabetes mellitus without complication (HCC) No date: Diverticulitis No date: Emphysema of lung ([place]) No date: GERD (gastroesophageal reflux disease) No date: H/O drug abuse     Comment:  Clean since 2009 No date: H/O ETOH abuse     Comment:  Sober since 2009 No date: Hepatitis C 2016: History of sepsis No date: Hypertension 2007: Malignant brain tumor ([place]) No date: Migraines No date: Pancreatic ascites 2013: Pancreatitis, alcoholic 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 No date: Seizures [facility])     Comment:  Last seizure October 2017 No date: Spleen enlarged 2007, 2008: Stroke [facility])     Comment:  during brain surgery No date: Urine incontinence Past  Surgical History: 1979: ABDOMINAL HYSTERECTOMY 1999: APPENDECTOMY 2007: BRAIN SURGERY     Comment:  malignant brain tumor 2001: CHOLECYSTECTOMY 2006: COLON SURGERY 09/15/2015: CRANIOPLASTY; N/A     Comment:  Procedure: Cranioplasty with retrieval of bone flap from              abdominal pocket;  Surgeon: Ashok Pall, MD;  Location:               [place] NEURO ORS;  Service: Neurosurgery;  Laterality: N/A;                Cranioplasty with retrieval of bone flap from abdominal               pocket 08/16/2015: CRANIOTOMY; Right     Comment:  Procedure: Right Fronto-Temporal-Parietal Craniotomy for              Evacuation of Hematoma with Bone Flap Placement in               Abdomen;  Surgeon: Ashok Pall, MD;  Location: [place] NEURO               ORS;  Service: Neurosurgery;  Laterality: Right; 12/28/2015: CRANIOTOMY; N/A     Comment:  Procedure: Craniectomy for wound debridement;  Surgeon:               Ashok Pall, MD;  Location: [place] NEURO ORS;  Service:               Neurosurgery;  Laterality: N/A;  Craniectomy for wound  debridement No date: DILATION AND CURETTAGE OF UTERUS No date: EYE SURGERY No date: JOINT REPLACEMENT; Left     Comment:  TOTAL KNEE REPLACEMENT 2011: rectovaginal fistula repair w/ colostomy 2005: REPLACEMENT TOTAL KNEE; Left BMI    Body Mass Index:  43.40 kg/m     Reproductive/Obstetrics negative OB ROS                             Anesthesia Physical Anesthesia Plan  ASA: IV and emergent  Anesthesia Plan: General   Post-op Pain Management:    Induction:   PONV Risk Score and Plan:   Airway Management Planned:   Additional Equipment:   Intra-op Plan:   Post-operative Plan:   Informed Consent: I have reviewed the patients History and Physical, chart, labs and discussed the procedure including the risks, benefits and alternatives for the proposed anesthesia with the patient or authorized representative who has  indicated his/her understanding and acceptance.   Dental Advisory Given  Plan Discussed with: CRNA  Anesthesia Plan Comments:        Anesthesia Quick Evaluation

## 2017-07-17 NOTE — Progress Notes (Signed)
EGD and colonoscopy post procedure note:  Findings: - Normal first portion of the duodenum and second portion of the duodenum. - Portal hypertensive gastropathy. - Small (< 5 mm) esophageal varices. - Normal gastroesophageal junction.  - Preparation of the colon was fair. - Congested mucosa in the entire examined colon c/w portal colopathy. - Rectal bleeding is secondary to portal colopathy involving rectum   Recs: - Return patient to hospital ward for possible discharge same day. - Advance diet as tolerated today. - Continue present medications. - Repeat colonoscopy in 5 years for surveillance. - Avoid constipation - No further GI workup needed at this time - Return to GI clinic in 4 weeks for cirrhsois care.  Jaime Darby, MD [address]  [place]  [place], [place] [address]  Main: [phone]  Fax: [phone] Pager: [phone]

## 2017-07-17 NOTE — Anesthesia Postprocedure Evaluation (Signed)
Anesthesia Post Note  Patient: [name]  Procedure(s) Performed: COLONOSCOPY (N/A ) ESOPHAGOGASTRODUODENOSCOPY (EGD) (N/A )  Patient location during evaluation: PACU Anesthesia Type: General Level of consciousness: awake and alert Pain management: pain level controlled Vital Signs Assessment: post-procedure vital signs reviewed and stable Respiratory status: spontaneous breathing, nonlabored ventilation, respiratory function stable and patient connected to nasal cannula oxygen Cardiovascular status: blood pressure returned to baseline and stable Postop Assessment: no apparent nausea or vomiting Anesthetic complications: no     Last Vitals:  Vitals:   07/17/17 1140 07/17/17 1150  BP: 109/83 120/81  Pulse: 90 88  Resp: 19 17  Temp:    SpO2: 100% 98%    Last Pain:  Vitals:   07/17/17 1123  TempSrc:   PainSc: Asleep                 Molli Barrows

## 2017-07-17 NOTE — Anesthesia Post-op Follow-up Note (Signed)
Anesthesia QCDR form completed.        

## 2017-07-17 NOTE — Discharge Summary (Signed)
[facility] NAME: [name]    MR#:  [id number]  DATE OF BIRTH:  Jul 24, 1955  DATE OF ADMISSION:  07/15/2017 ADMITTING PHYSICIAN: Saundra Shelling, MD  DATE OF DISCHARGE: 07/17/2017  PRIMARY CARE PHYSICIAN: [facility]    ADMISSION DIAGNOSIS:  Rectal bleeding [K62.5]  DISCHARGE DIAGNOSIS:  Active Problems:   GI bleed   SECONDARY DIAGNOSIS:   Past Medical History:  Diagnosis Date  . Allergy   . Anemia   . Anxiety   . CHF (congestive heart failure) ([place])   . Chronic bronchitis ([place])   . Chronic kidney disease   . Cirrhosis ([place])   . Depression   . Diabetes mellitus without complication ([place])   . Diverticulitis   . Emphysema of lung ([place])   . GERD (gastroesophageal reflux disease)   . H/O drug abuse    Clean since 2009  . H/O ETOH abuse    Sober since 2009  . Hepatitis C   . History of sepsis 2016  . Hypertension   . Malignant brain tumor ([place]) 2007  . Migraines   . Pancreatic ascites   . Pancreatitis, alcoholic 6767  . Rectovaginal fistula   . Seizures [facility])    Last seizure October 2017  . Spleen enlarged   . Stroke [facility]) 2007, 2008   during brain surgery  . Urine incontinence     HOSPITAL COURSE:  62 y.o. female with a known history of chronic diastolic congestive heart failure, diabetes mellitus, emphysema, GERD, hepatitis C, hypertension, chronic kidney disease stage 3 presented to the emergency room with rectal bleed.  1. Acute blood loss anemia with chronic anemia due to Rectal bled: her hemoglobin has been stable. She underwent EGD and colonoscopy showing  Findings: - Normal first portion of the duodenum and second portion of the duodenum. - Portal hypertensive gastropathy. - Small (< 5 mm) esophageal varices. - Normal gastroesophageal junction.  - Preparation of the colon was fair. - Congested mucosa in the entire examined colon c/w portal colopathy. - Rectal bleeding is secondary to  portal colopathy involving rectum   Recs: - Return patient to hospital ward for possible discharge same day. - Advance diet as tolerated today. - Continue present medications. - Repeat colonoscopy in 5 years for surveillance. - Avoid constipation - No further GI workup needed at this time - Return to GI clinic in 4 weeks for cirrhsois care.   2. RUL PNA: She will be discharged on Ceftin and Azithromycin  3. Recent E coli UTI: She will continue Ceftin  4.Hyperlipidemia on pravastatin 5. History of cirrhosis  6. History of pancreatitis on pancreatic enzymes      DISCHARGE CONDITIONS AND DIET:   Stable Cardiac diet  CONSULTS OBTAINED:  Treatment Team:  Lin Landsman, MD  DRUG ALLERGIES:   Allergies  Allergen Reactions  . Ibuprofen Other (See Comments)    Pt states that she is unable to take this medication because of her liver.    . Sulfa Antibiotics Hives  . Hydroxyzine Other (See Comments)    Causes crying   . Amoxicillin Itching, Rash and Other (See Comments)    Has patient had a PCN reaction causing immediate rash, facial/tongue/throat swelling, SOB or lightheadedness with hypotension: No Has patient had a PCN reaction causing severe rash involving mucus membranes or skin necrosis: No Has patient had a PCN reaction that required hospitalization No Has patient had a PCN reaction occurring within the last  10 years: Yes If all of the above answers are "NO", then may proceed with Cephalosporin use.  . Chocolate Rash  . Chocolate Flavor Rash  . Ciprofloxacin Itching and Rash  . Dilaudid [Hydromorphone Hcl] Itching  . Fentanyl Rash  . Hydromorphone Itching  . Hydromorphone Hcl Itching  . Metformin Nausea And Vomiting  . Penicillins Itching, Rash and Other (See Comments)    Has patient had a PCN reaction causing immediate rash, facial/tongue/throat swelling, SOB or lightheadedness with hypotension: No Has patient had a PCN reaction causing severe rash  involving mucus membranes or skin necrosis: No Has patient had a PCN reaction that required hospitalization No Has patient had a PCN reaction occurring within the last 10 years: Yes If all of the above answers are "NO", then may proceed with Cephalosporin use.  . Strawberry Extract Rash    DISCHARGE MEDICATIONS:   Allergies as of 07/17/2017      Reactions   Ibuprofen Other (See Comments)   Pt states that she is unable to take this medication because of her liver.    Sulfa Antibiotics Hives   Hydroxyzine Other (See Comments)   Causes crying    Amoxicillin Itching, Rash, Other (See Comments)   Has patient had a PCN reaction causing immediate rash, facial/tongue/throat swelling, SOB or lightheadedness with hypotension: No Has patient had a PCN reaction causing severe rash involving mucus membranes or skin necrosis: No Has patient had a PCN reaction that required hospitalization No Has patient had a PCN reaction occurring within the last 10 years: Yes If all of the above answers are "NO", then may proceed with Cephalosporin use.   Chocolate Rash   Chocolate Flavor Rash   Ciprofloxacin Itching, Rash   Dilaudid [hydromorphone Hcl] Itching   Fentanyl Rash   Hydromorphone Itching   Hydromorphone Hcl Itching   Metformin Nausea And Vomiting   Penicillins Itching, Rash, Other (See Comments)   Has patient had a PCN reaction causing immediate rash, facial/tongue/throat swelling, SOB or lightheadedness with hypotension: No Has patient had a PCN reaction causing severe rash involving mucus membranes or skin necrosis: No Has patient had a PCN reaction that required hospitalization No Has patient had a PCN reaction occurring within the last 10 years: Yes If all of the above answers are "NO", then may proceed with Cephalosporin use.   Strawberry Extract Rash      Medication List    STOP taking these medications   brompheniramine-pseudoephedrine-DM 30-2-10 MG/5ML syrup   cephALEXin 500 MG  capsule Commonly known as:  KEFLEX   cyproheptadine 4 MG tablet Commonly known as:  PERIACTIN   sucralfate 1 g tablet Commonly known as:  CARAFATE   triamcinolone 0.025 % ointment Commonly known as:  KENALOG   triamcinolone cream 0.1 % Commonly known as:  KENALOG   zolpidem 5 MG tablet Commonly known as:  AMBIEN     TAKE these medications   albuterol 108 (90 Base) MCG/ACT inhaler Commonly known as:  PROVENTIL HFA;VENTOLIN HFA Inhale 2 puffs into the lungs every 6 (six) hours as needed for wheezing or shortness of breath.   albuterol (5 MG/ML) 0.5% nebulizer solution Commonly known as:  PROVENTIL Take 2.5 mg by nebulization every 6 (six) hours as needed for wheezing or shortness of breath.   ALPRAZolam 1 MG tablet Commonly known as:  XANAX Take 1 tablet (1 mg total) by mouth at bedtime as needed.   amitriptyline 50 MG tablet Commonly known as:  ELAVIL Take 50 mg by  mouth at bedtime.   amLODipine 10 MG tablet Commonly known as:  NORVASC Take 10 mg by mouth daily.   azithromycin 500 MG tablet Commonly known as:  ZITHROMAX Take 1 tablet (500 mg total) by mouth daily for 6 days. Take 1 tablet daily for 6 days.   cefUROXime 500 MG tablet Commonly known as:  CEFTIN Take 1 tablet (500 mg total) by mouth 2 (two) times daily with a meal.   diphenhydrAMINE 25 mg capsule Commonly known as:  BENADRYL Take 1 capsule (25 mg total) by mouth every 8 (eight) hours as needed for itching.   DULERA 200-5 MCG/ACT Aero Generic drug:  mometasone-formoterol Inhale 2 puffs into the lungs 2 (two) times daily.   FIFTY50 GLUCOSE METER 2.0 w/Device Kit ICD-10 E11.9 Check blood sugars once daily   Fluticasone-Salmeterol 250-50 MCG/DOSE Aepb Commonly known as:  ADVAIR Inhale 1 puff into the lungs 2 (two) times daily.   furosemide 20 MG tablet Commonly known as:  LASIX Take 20 mg by mouth every morning.   gabapentin 300 MG capsule Commonly known as:  NEURONTIN Take 600 mg by mouth  4 (four) times daily.   hydrOXYzine 25 MG tablet Commonly known as:  ATARAX/VISTARIL Take 1 tablet by mouth  every 6 hours as needed   Ipratropium-Albuterol 20-100 MCG/ACT Aers respimat Commonly known as:  COMBIVENT Inhale 2 puffs into the lungs every 6 (six) hours as needed for wheezing or shortness of breath.   levETIRAcetam 500 MG tablet Commonly known as:  KEPPRA Take 500 mg by mouth 2 (two) times daily.   lipase/protease/amylase 12000 units Cpep capsule Commonly known as:  CREON Take 24,000 Units by mouth 3 (three) times daily with meals.   omeprazole 20 MG capsule Commonly known as:  PRILOSEC Take 20 mg by mouth every morning.   Oxycodone HCl 20 MG Tabs Take 20 mg by mouth 3 (three) times daily as needed (pain).   phenytoin 100 MG/4ML suspension Commonly known as:  DILANTIN Take 300 mg by mouth 2 (two) times daily. Takes 25m by mouth bid   pravastatin 10 MG tablet Commonly known as:  PRAVACHOL Take 10 mg by mouth daily.   prochlorperazine 10 MG tablet Commonly known as:  COMPAZINE Take 10 mg by mouth every 12 (twelve) hours as needed for nausea or vomiting.   promethazine 25 MG tablet Commonly known as:  PHENERGAN Take 25 mg by mouth every 8 (eight) hours as needed for nausea or vomiting.   sertraline 100 MG tablet Commonly known as:  ZOLOFT Take 200 mg by mouth daily.   spironolactone 50 MG tablet Commonly known as:  ALDACTONE Take 1 tablet (50 mg total) by mouth 2 (two) times daily.   tiotropium 18 MCG inhalation capsule Commonly known as:  SPIRIVA Place 18 mcg into inhaler and inhale daily as needed (shortness of breath).   traMADol 50 MG tablet Commonly known as:  ULTRAM Take 1 tablet (50 mg total) by mouth every 6 (six) hours as needed for moderate pain.         Today   CHIEF COMPLAINT:  No rectal bleeding overnight   VITAL SIGNS:  Blood pressure 119/68, pulse 85, temperature 98 F (36.7 C), temperature source Oral, resp. rate 20,  height _0  (1.702 m), weight 125.7 kg (277 lb 1.6 oz), SpO2 94 %.   REVIEW OF SYSTEMS:  Review of Systems  Constitutional: Negative.  Negative for chills, fever and malaise/fatigue.  HENT: Negative.  Negative for ear discharge, ear  pain, hearing loss, nosebleeds and sore throat.   Eyes: Negative.  Negative for blurred vision and pain.  Respiratory: Negative.  Negative for cough, hemoptysis, shortness of breath and wheezing.   Cardiovascular: Negative.  Negative for chest pain, palpitations and leg swelling.  Gastrointestinal: Negative.  Negative for abdominal pain, blood in stool, diarrhea, nausea and vomiting.  Genitourinary: Negative.  Negative for dysuria.  Musculoskeletal: Negative.  Negative for back pain.  Skin: Negative.   Neurological: Negative for dizziness, tremors, speech change, focal weakness, seizures and headaches.  Endo/Heme/Allergies: Negative.  Does not bruise/bleed easily.  Psychiatric/Behavioral: Negative for depression, hallucinations and suicidal ideas. The patient is nervous/anxious.      PHYSICAL EXAMINATION:  GENERAL:  62 y.o.-year-old patient lying in the bed with no acute distress.  NECK:  Supple, no jugular venous distention. No thyroid enlargement, no tenderness.  LUNGS: Normal breath sounds bilaterally, no wheezing, rales,rhonchi  No use of accessory muscles of respiration.  CARDIOVASCULAR: S1, S2 normal. No murmurs, rubs, or gallops.  ABDOMEN: Soft, non-tender, non-distended. Bowel sounds present. No organomegaly or mass.  EXTREMITIES: No pedal edema, cyanosis, or clubbing.  PSYCHIATRIC: The patient is alert and oriented x 3.  SKIN: No obvious rash, lesion, or ulcer.   DATA REVIEW:   CBC Recent Labs  Lab 07/15/17 2130  07/16/17 2125  WBC 3.1*  --   --   HGB 11.2*   < > 11.3*  HCT 35.2   < > 36.1  PLT 166  --   --    < > = values in this interval not displayed.    Chemistries  Recent Labs  Lab 07/14/17 0530  07/15/17 2130 07/16/17 0455   NA 138   < > 139 139  K 3.1*   < > 3.5 3.8  CL 108   < > 108 111  CO2 23   < > 23 22  GLUCOSE 104*   < > 128* 80  BUN 7   < > 6 5*  CREATININE 0.65   < > 0.94 0.85  CALCIUM 8.4*   < > 8.6* 8.3*  MG 2.0  --   --   --   AST  --   --  32  --   ALT  --   --  20  --   ALKPHOS  --   --  89  --   BILITOT  --   --  0.8  --    < > = values in this interval not displayed.    Cardiac Enzymes Recent Labs  Lab 07/13/17 2228  TROPONINI <0.03    Microbiology Results  _0 @  RADIOLOGY:  Dg Chest Port 1 View  Result Date: 07/16/2017 CLINICAL DATA:  Cough, CHF.  History of bronchitis, substance abuse. EXAM: PORTABLE CHEST 1 VIEW COMPARISON:  Chest radiograph July 13, 2017 FINDINGS: Cardiomediastinal silhouette is normal considering rotation to the RIGHT. Calcified aortic arch. Patchy RIGHT mid and lower lung zone peripheral airspace opacity with bibasilar strandy densities. Bronchitic changes. No pleural effusion. No pneumothorax. Large body habitus. Osseous structures are nonacute. IMPRESSION: Patchy RIGHT mid and lower lung zone airspace opacity concerning for pneumonia. Followup PA and lateral chest X-ray is recommended in 3-4 weeks following trial of antibiotic therapy to ensure resolution and exclude underlying malignancy. COPD/chronic bronchitic changes. Aortic Atherosclerosis (ICD10-I70.0) and Emphysema (ICD10-J43.9). Electronically Signed   By: Elon Alas M.D.   On: 07/16/2017 13:36      Allergies as of 07/17/2017  Reactions   Ibuprofen Other (See Comments)   Pt states that she is unable to take this medication because of her liver.    Sulfa Antibiotics Hives   Hydroxyzine Other (See Comments)   Causes crying    Amoxicillin Itching, Rash, Other (See Comments)   Has patient had a PCN reaction causing immediate rash, facial/tongue/throat swelling, SOB or lightheadedness with hypotension: No Has patient had a PCN reaction causing severe rash involving mucus  membranes or skin necrosis: No Has patient had a PCN reaction that required hospitalization No Has patient had a PCN reaction occurring within the last 10 years: Yes If all of the above answers are "NO", then may proceed with Cephalosporin use.   Chocolate Rash   Chocolate Flavor Rash   Ciprofloxacin Itching, Rash   Dilaudid [hydromorphone Hcl] Itching   Fentanyl Rash   Hydromorphone Itching   Hydromorphone Hcl Itching   Metformin Nausea And Vomiting   Penicillins Itching, Rash, Other (See Comments)   Has patient had a PCN reaction causing immediate rash, facial/tongue/throat swelling, SOB or lightheadedness with hypotension: No Has patient had a PCN reaction causing severe rash involving mucus membranes or skin necrosis: No Has patient had a PCN reaction that required hospitalization No Has patient had a PCN reaction occurring within the last 10 years: Yes If all of the above answers are "NO", then may proceed with Cephalosporin use.   Strawberry Extract Rash      Medication List    STOP taking these medications   brompheniramine-pseudoephedrine-DM 30-2-10 MG/5ML syrup   cephALEXin 500 MG capsule Commonly known as:  KEFLEX   cyproheptadine 4 MG tablet Commonly known as:  PERIACTIN   sucralfate 1 g tablet Commonly known as:  CARAFATE   triamcinolone 0.025 % ointment Commonly known as:  KENALOG   triamcinolone cream 0.1 % Commonly known as:  KENALOG   zolpidem 5 MG tablet Commonly known as:  AMBIEN     TAKE these medications   albuterol 108 (90 Base) MCG/ACT inhaler Commonly known as:  PROVENTIL HFA;VENTOLIN HFA Inhale 2 puffs into the lungs every 6 (six) hours as needed for wheezing or shortness of breath.   albuterol (5 MG/ML) 0.5% nebulizer solution Commonly known as:  PROVENTIL Take 2.5 mg by nebulization every 6 (six) hours as needed for wheezing or shortness of breath.   ALPRAZolam 1 MG tablet Commonly known as:  XANAX Take 1 tablet (1 mg total) by mouth  at bedtime as needed.   amitriptyline 50 MG tablet Commonly known as:  ELAVIL Take 50 mg by mouth at bedtime.   amLODipine 10 MG tablet Commonly known as:  NORVASC Take 10 mg by mouth daily.   azithromycin 500 MG tablet Commonly known as:  ZITHROMAX Take 1 tablet (500 mg total) by mouth daily for 6 days. Take 1 tablet daily for 6 days.   cefUROXime 500 MG tablet Commonly known as:  CEFTIN Take 1 tablet (500 mg total) by mouth 2 (two) times daily with a meal.   diphenhydrAMINE 25 mg capsule Commonly known as:  BENADRYL Take 1 capsule (25 mg total) by mouth every 8 (eight) hours as needed for itching.   DULERA 200-5 MCG/ACT Aero Generic drug:  mometasone-formoterol Inhale 2 puffs into the lungs 2 (two) times daily.   FIFTY50 GLUCOSE METER 2.0 w/Device Kit ICD-10 E11.9 Check blood sugars once daily   Fluticasone-Salmeterol 250-50 MCG/DOSE Aepb Commonly known as:  ADVAIR Inhale 1 puff into the lungs 2 (two) times daily.  furosemide 20 MG tablet Commonly known as:  LASIX Take 20 mg by mouth every morning.   gabapentin 300 MG capsule Commonly known as:  NEURONTIN Take 600 mg by mouth 4 (four) times daily.   hydrOXYzine 25 MG tablet Commonly known as:  ATARAX/VISTARIL Take 1 tablet by mouth  every 6 hours as needed   Ipratropium-Albuterol 20-100 MCG/ACT Aers respimat Commonly known as:  COMBIVENT Inhale 2 puffs into the lungs every 6 (six) hours as needed for wheezing or shortness of breath.   levETIRAcetam 500 MG tablet Commonly known as:  KEPPRA Take 500 mg by mouth 2 (two) times daily.   lipase/protease/amylase 12000 units Cpep capsule Commonly known as:  CREON Take 24,000 Units by mouth 3 (three) times daily with meals.   omeprazole 20 MG capsule Commonly known as:  PRILOSEC Take 20 mg by mouth every morning.   Oxycodone HCl 20 MG Tabs Take 20 mg by mouth 3 (three) times daily as needed (pain).   phenytoin 100 MG/4ML suspension Commonly known as:   DILANTIN Take 300 mg by mouth 2 (two) times daily. Takes 19m by mouth bid   pravastatin 10 MG tablet Commonly known as:  PRAVACHOL Take 10 mg by mouth daily.   prochlorperazine 10 MG tablet Commonly known as:  COMPAZINE Take 10 mg by mouth every 12 (twelve) hours as needed for nausea or vomiting.   promethazine 25 MG tablet Commonly known as:  PHENERGAN Take 25 mg by mouth every 8 (eight) hours as needed for nausea or vomiting.   sertraline 100 MG tablet Commonly known as:  ZOLOFT Take 200 mg by mouth daily.   spironolactone 50 MG tablet Commonly known as:  ALDACTONE Take 1 tablet (50 mg total) by mouth 2 (two) times daily.   tiotropium 18 MCG inhalation capsule Commonly known as:  SPIRIVA Place 18 mcg into inhaler and inhale daily as needed (shortness of breath).   traMADol 50 MG tablet Commonly known as:  ULTRAM Take 1 tablet (50 mg total) by mouth every 6 (six) hours as needed for moderate pain.          Management plans discussed with the patient and she is in agreement. Stable for discharge   Patient should [facility] up with [facility]  CODE STATUS:     Code Status Orders  (From admission, onward)        Start     Ordered   07/16/17 0141  Full code  Continuous     07/16/17 0140    Code Status History    Date Active Date Inactive Code Status Order ID Comments User Context   07/14/2017 05:03 07/15/2017 15:16 Full Code [id number] PSaundra Shelling MD Inpatient   02/24/2016 20:03 02/26/2016 17:20 Full Code [id number] SIdelle Crouch MD Inpatient   12/26/2015 13:00 01/05/2016 21:42 Full Code [id number] CAshok Pall MD Inpatient   10/22/2015 04:26 10/25/2015 18:52 Full Code [id number] PSaundra Shelling MD ED   08/16/2015 21:36 08/30/2015 18:11 Full Code [id number] CAshok Pall MD Inpatient   06/22/2015 01:45 06/22/2015 18:09 Full Code [id number] HLytle Butte MD ED   05/16/2015 12:00 05/16/2015 18:17 DNR [id number] SMax Sane MD Inpatient   05/15/2015 01:10  05/16/2015 12:00 Full Code [id number] DHarrie Foreman MD Inpatient   11/05/2014 12:35 11/05/2014 15:55 Full Code [id number] EPenne Lash RN Inpatient   11/05/2014 12:17 11/05/2014 12:35 Full Code [id number] EPenne Lash RN Inpatient  TOTAL TIME TAKING CARE OF THIS PATIENT: 37 minutes.    Note: This dictation was prepared with Dragon dictation along with smaller phrase technology. Any transcriptional errors that result from this process are unintentional.  Kennidee Heyne M.D on 07/17/2017 at 10:07 AM  Between 7am to 6pm - Pager - [phone] After 6pm go to [email or web address] - password [email or web address] [facility]  Office  [phone]  CC: Primary care physician; [facility]

## 2017-07-17 NOTE — Op Note (Signed)
[facility] Gastroenterology Patient Name: [name] Procedure Date: 07/17/2017 10:25 AM MRN: [id number] Account #: [id number] Date of Birth: 1955-11-30 Admit Type: Outpatient Age: 62 Room: [facility] ENDO ROOM 4 Gender: Female Note Status: Finalized Procedure:            Upper GI endoscopy Indications:          Follow-up of esophageal varices Providers:            Lin Landsman MD, MD Medicines:            Monitored Anesthesia Care Complications:        No immediate complications. Estimated blood loss: None. Procedure:            Pre-Anesthesia Assessment:                       - Prior to the procedure, a History and Physical was                        performed, and patient medications and allergies were                        reviewed. The patient is competent. The risks and                        benefits of the procedure and the sedation options and                        risks were discussed with the patient. All questions                        were answered and informed consent was obtained.                        Patient identification and proposed procedure were                        verified by the physician, the nurse, the                        anesthesiologist, the anesthetist and the technician in                        the pre-procedure area in the procedure room. Mental                        Status Examination: alert and oriented. Airway                        Examination: normal oropharyngeal airway and neck                        mobility. Respiratory Examination: clear to                        auscultation. CV Examination: normal. Prophylactic                        Antibiotics: The patient does not require prophylactic  antibiotics. Prior Anticoagulants: The patient has                        taken no previous anticoagulant or antiplatelet agents.                        ASA Grade Assessment: III - A patient with severe                         systemic disease. After reviewing the risks and                        benefits, the patient was deemed in satisfactory                        condition to undergo the procedure. The anesthesia plan                        was to use monitored anesthesia care (MAC). Immediately                        prior to administration of medications, the patient was                        re-assessed for adequacy to receive sedatives. The                        heart rate, respiratory rate, oxygen saturations, blood                        pressure, adequacy of pulmonary ventilation, and                        response to care were monitored throughout the                        procedure. The physical status of the patient was                        re-assessed after the procedure.                       After obtaining informed consent, the endoscope was                        passed under direct vision. Throughout the procedure,                        the patient's blood pressure, pulse, and oxygen                        saturations were monitored continuously. The Endoscope                        was introduced through the mouth, and advanced to the                        second part of duodenum. The upper GI endoscopy was  accomplished without difficulty. The patient tolerated                        the procedure well. Findings:      The first portion of the duodenum and second portion of the duodenum       were normal.      Mild portal hypertensive gastropathy was found in the entire examined       stomach.      Small (< 5 mm) varices were found in the lower third of the esophagus.      The gastroesophageal junction was normal. Impression:           - Normal first portion of the duodenum and second                        portion of the duodenum.                       - Portal hypertensive gastropathy.                       - Small (< 5 mm) esophageal  varices.                       - Normal gastroesophageal junction.                       - No specimens collected. Recommendation:       - Repeat upper endoscopy in 2 years for surveillance of                        varices.                       - Perform a colonoscopy as previously scheduled for                        today. Procedure Code(s):    --- Professional ---                       [phone], Esophagogastroduodenoscopy, flexible, transoral;                        diagnostic, including collection of specimen(s) by                        brushing or washing, when performed (separate procedure) Diagnosis Code(s):    --- Professional ---                       K76.6, Portal hypertension                       K31.89, Other diseases of stomach and duodenum                       I85.00, Esophageal varices without bleeding CPT copyright 2016 [employer]. All rights reserved. The codes documented in this report are preliminary and upon coder review may  be revised to meet current compliance requirements. Dr. Ulyess Mort Lin Landsman MD, MD 07/17/2017 11:02:17 AM This report has been signed electronically. Number of Addenda: 0 Note Initiated On: 07/17/2017 10:25 AM  [facility]

## 2017-07-17 NOTE — Progress Notes (Signed)
Back from Endo lab, v/s stable.

## 2017-07-17 NOTE — Care Management (Signed)
Patient admitted for GI bleed. Patient lives at home with husband and 4 grandchildren.  PCP [facility].  Unable to verbalize MD that she sees at the clinic.  Denies any issues obtaining medication.  Husbands provides transportation.  Patient has Medicaid PCS services 6 hours a day/ 7 days a week. Patient to discharge home today.  Home health orders have been written.  Patient provided home health agency preference.  Patient states that she does not have a preference of agency.  Referral made to [facility] with [place].  RNCM signing off.

## 2017-07-17 NOTE — Progress Notes (Signed)
Pt being wheeled down now in wc to car, son to take her home. Has Rx's zithromax and xanax. Understands f/u appts. Not happy about being sent home today after prep last noc and upper and lower GI today-pt tired.

## 2017-07-17 NOTE — Transfer of Care (Signed)
Immediate Anesthesia Transfer of Care Note  Patient: [name]  Procedure(s) Performed: COLONOSCOPY (N/A ) ESOPHAGOGASTRODUODENOSCOPY (EGD) (N/A )  Patient Location: PACU and Endoscopy Unit  Anesthesia Type:General  Level of Consciousness: awake and patient cooperative  Airway & Oxygen Therapy: Patient Spontanous Breathing and Patient connected to nasal cannula oxygen  Post-op Assessment: Report given to RN and Post -op Vital signs reviewed and stable  Post vital signs: Reviewed and stable  Last Vitals:  Vitals:   07/17/17 0727 07/17/17 1036  BP:  117/74  Pulse:  86  Resp:  18  Temp:  36.6 C  SpO2: 94% 99%    Last Pain:  Vitals:   07/17/17 1036  TempSrc: Tympanic  PainSc: 0-No pain      Patients Stated Pain Goal: 3 (27/03/50 0938)  Complications: No apparent anesthesia complications

## 2017-07-18 NOTE — Progress Notes (Signed)
[facility]  Patient Status: I have notified the [name] that [facility] will not be accepting this patient.   We are not going to be able to accept this referral. We have had this patient multiple times in the past.  She is very non-compliant with care especially with  medications and scheduling [name] visits as well as keeping MD appointments.  She would also agree to visits and then we would go to the home and she would not be there.      [name] 07/18/2017, 11:07 AM

## 2017-07-19 LAB — BPAM RBC
BLOOD PRODUCT EXPIRATION DATE: 201901292359
BLOOD PRODUCT EXPIRATION DATE: 201902012359
Unit Type and Rh: 6200
Unit Type and Rh: 6200

## 2017-07-19 LAB — TYPE AND SCREEN
ABO/RH(D): A POS
Antibody Screen: POSITIVE
Unit division: 0
Unit division: 0

## 2017-07-20 NOTE — Anesthesia Postprocedure Evaluation (Signed)
Anesthesia Post Note  Patient: [name]  Procedure(s) Performed: COLONOSCOPY (N/A ) ESOPHAGOGASTRODUODENOSCOPY (EGD) (N/A )  Patient location during evaluation: PACU Anesthesia Type: General Level of consciousness: awake and alert Pain management: pain level controlled Vital Signs Assessment: post-procedure vital signs reviewed and stable Respiratory status: spontaneous breathing, nonlabored ventilation, respiratory function stable and patient connected to nasal cannula oxygen Cardiovascular status: blood pressure returned to baseline and stable Postop Assessment: no apparent nausea or vomiting Anesthetic complications: no     Last Vitals:  Vitals:   07/17/17 1200 07/17/17 1240  BP: 122/84 128/64  Pulse: 82 86  Resp: (!) 21 20  Temp:  36.6 C  SpO2: 100% 98%    Last Pain:  Vitals:   07/17/17 1240  TempSrc: Oral  PainSc:                  Molli Barrows

## 2017-07-21 ENCOUNTER — Encounter: Payer: Self-pay | Admitting: Gastroenterology

## 2017-07-26 IMAGING — CT CT HEAD W/O CM
2 series · 15 of 30 positions shown, 19 images · non-contrast
Comparison: Head CT 08/27/2013.

CLINICAL DATA: Recurrent falling over the last week. History of
malignant brain tumor with surgery in 4225. History of stroke and
seizures.

EXAM:
CT HEAD WITHOUT CONTRAST
TECHNIQUE: Contiguous axial images were obtained from the base of the skull
through the vertex without intravenous contrast.

[Series 2: soft tissue · axial · 0.42mm/px · z∈[-135,-115]mm · 2 of 28 slices shown]
[im 2/28  brain]
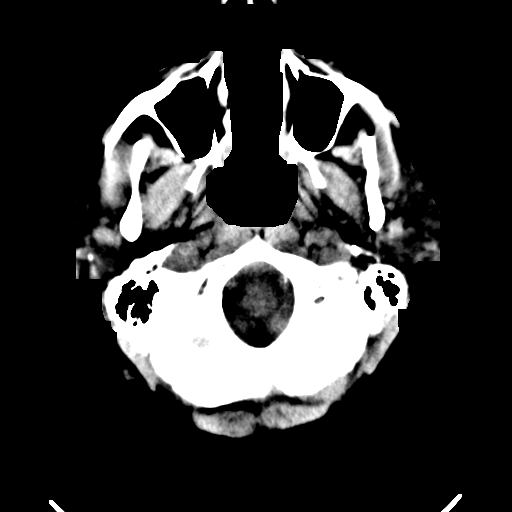
[im 6/28  brain]
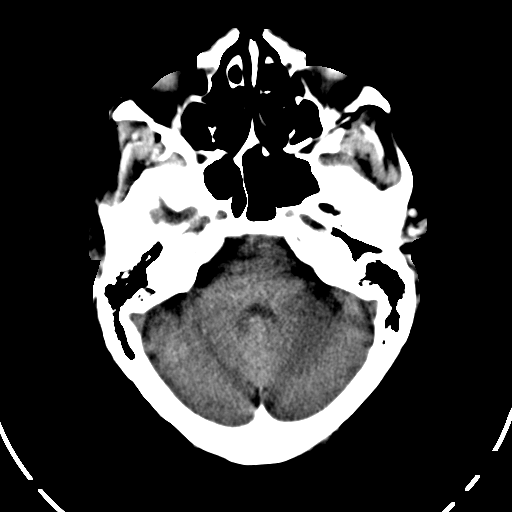

[Series 4: soft tissue recon · axial · 0.42mm/px · z∈[-90,+23]mm · 13 of 29 slices shown, 17 images]
[im 3/29  brain]
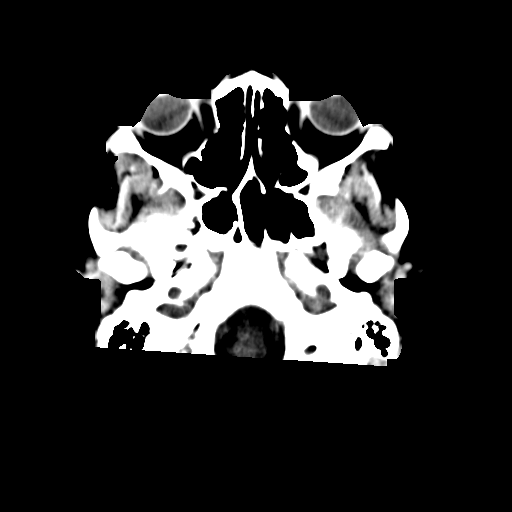
[im 3/29  bone]
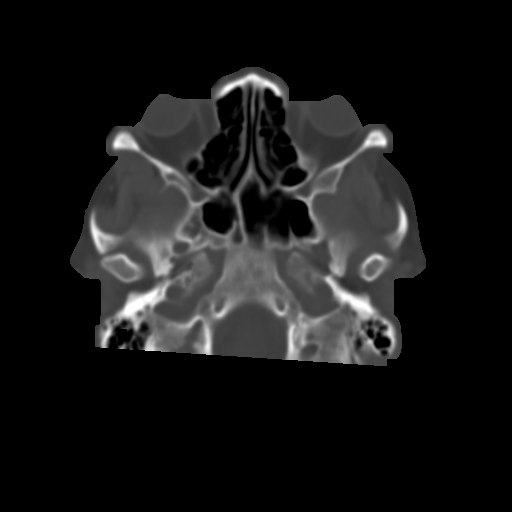
[im 5/29  brain]
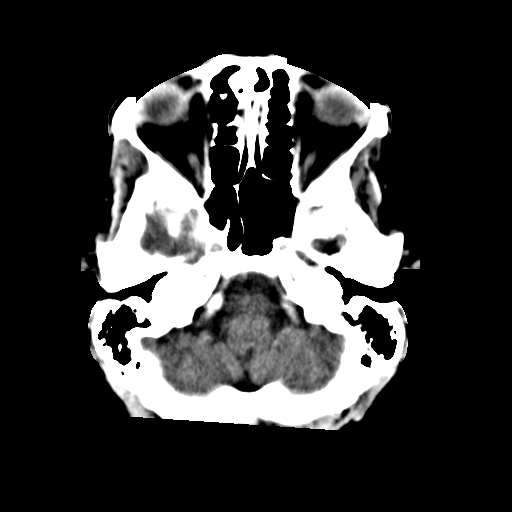
[im 7/29  brain]
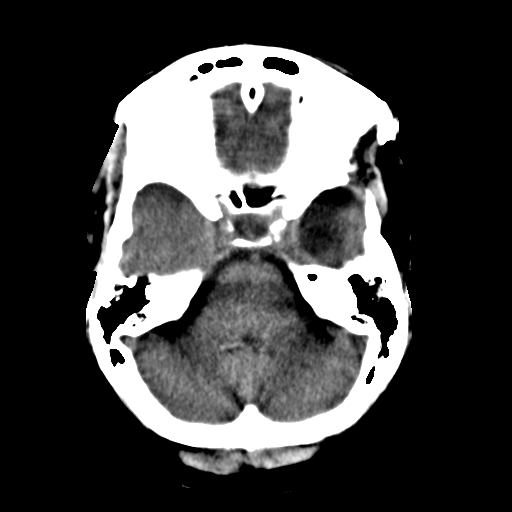
[im 9/29  brain]
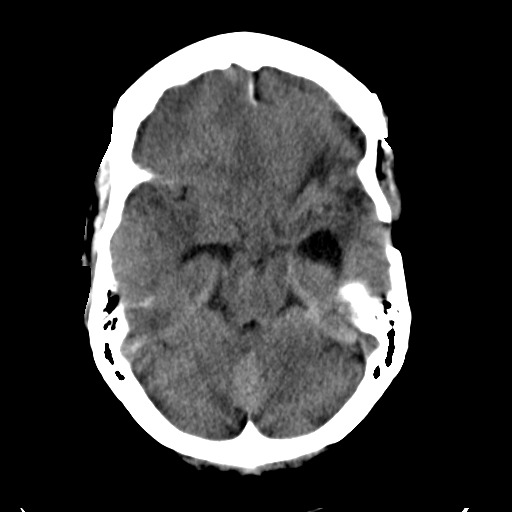
[im 11/29  brain]
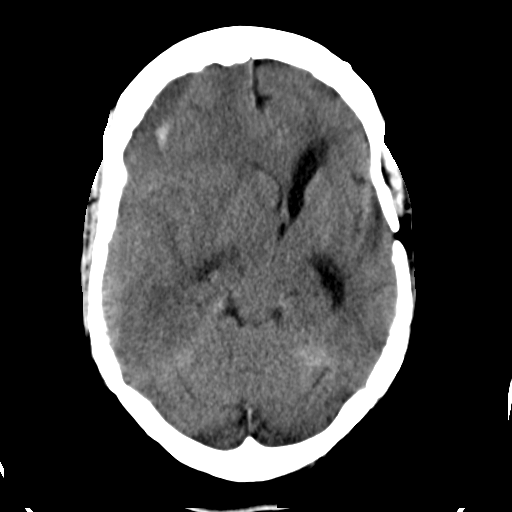
[im 11/29  bone]
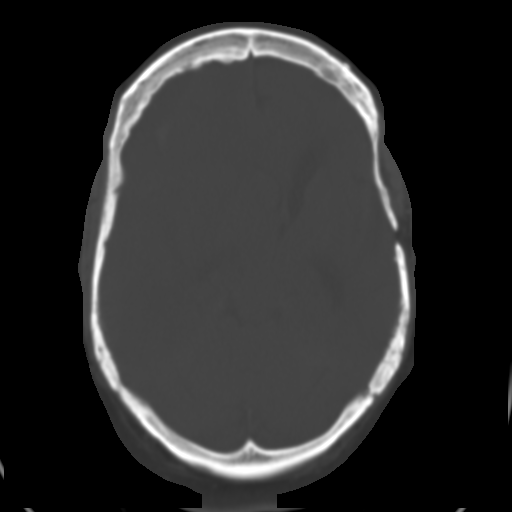
[im 13/29  brain]
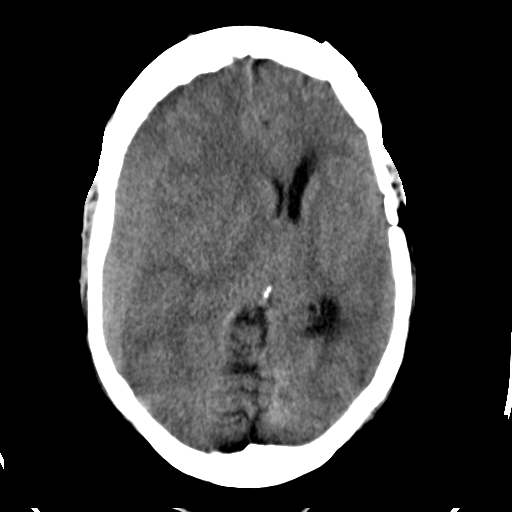
[im 15/29  brain]
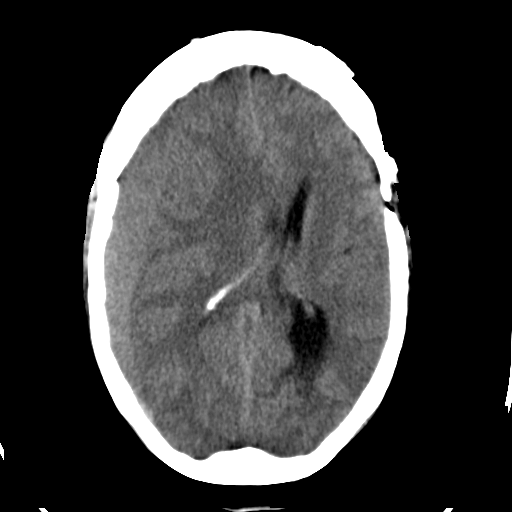
[im 17/29  brain]
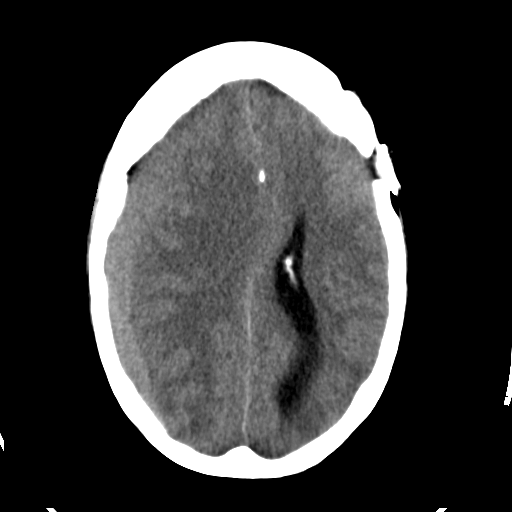
[im 19/29  brain]
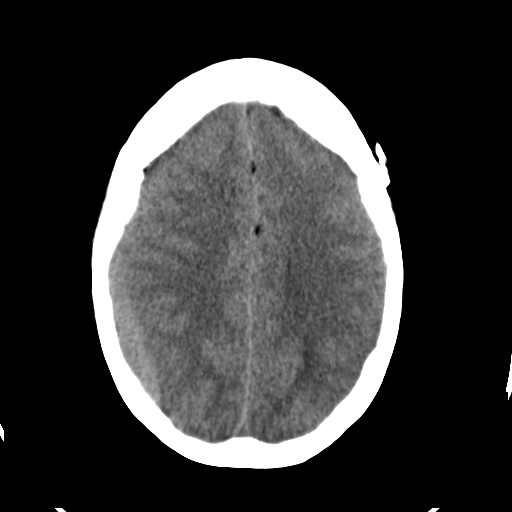
[im 19/29  bone]
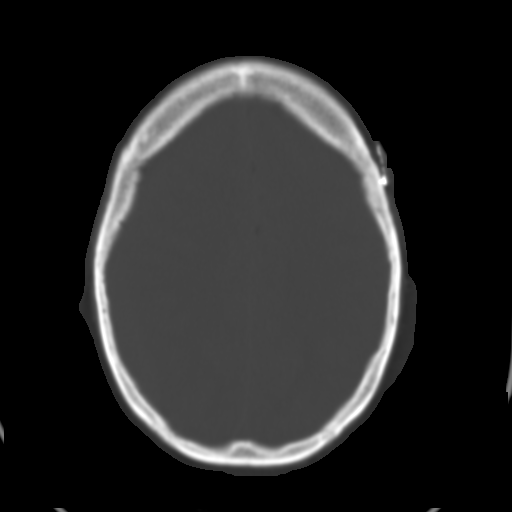
[im 21/29  brain]
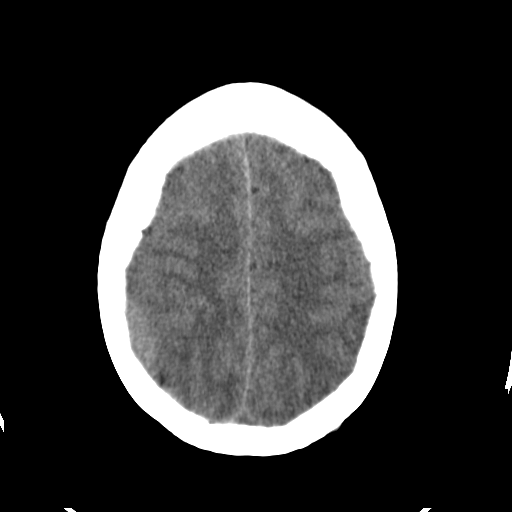
[im 23/29  brain]
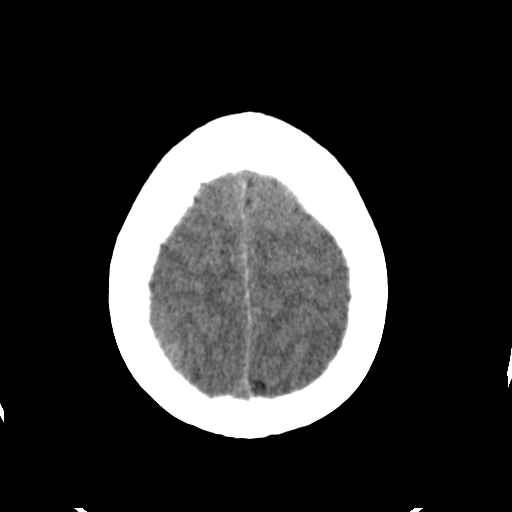
[im 25/29  brain]
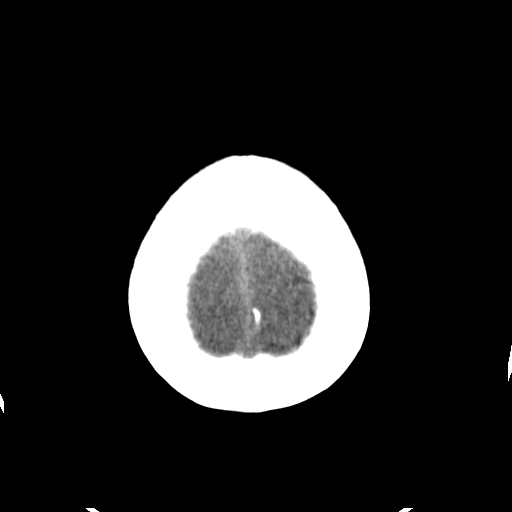
[im 27/29  brain]
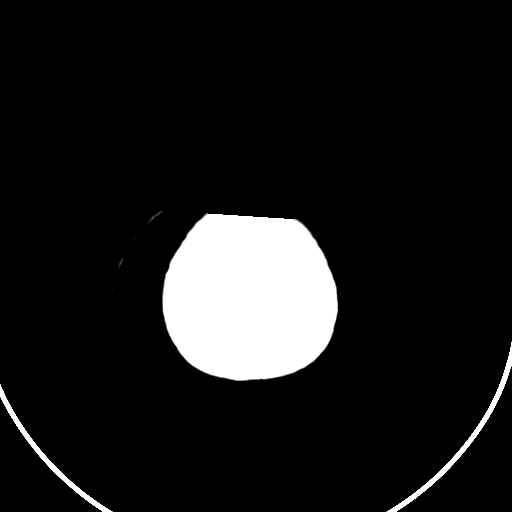
[im 27/29  bone]
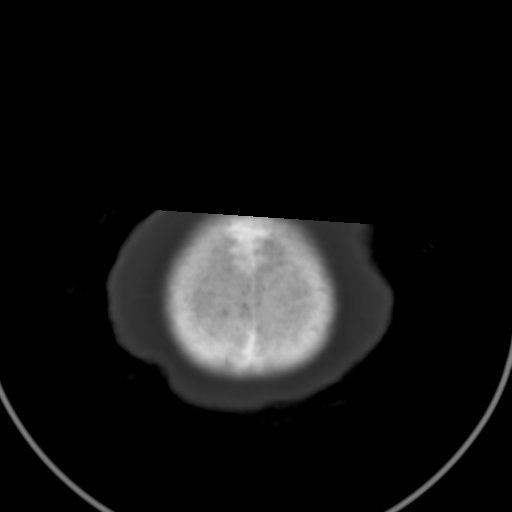

[15 of 30 positions shown; findings below may reference images not displayed]

FINDINGS: There is a large nearly isodense right-sided subdural hematoma. This
measures up to 1.8 cm in thickness. There are some higher density
components consistent with recent bleeding. There may be a small
amount of subdural blood layering of the tentorium, asymmetric to
the left. There is resulting right-to-left midline shift by 15 mm.
There is new asymmetric dilatation of the left lateral ventricle
which is likely entrapped. The subarachnoid spaces are largely
effaced.

Chronic encephalomalacia is present within the left temporal lobe
status post left frontal temporal craniotomy and reported tumor
resection. No evidence of intraparenchymal hemorrhage.

The visualized paranasal sinuses are clear. The calvarium
demonstrates no acute findings.
IMPRESSION: 1. Large nearly isodense right hemispheric subdural hematoma with
resulting midline shift and entrapment of the left lateral
ventricle.
2. Underlying changes from previous left frontotemporal craniotomy.
3. Critical Value/emergent results were called by telephone at the
time of interpretation on 08/16/2015 at [DATE] to Dr. NYA
SACHS , who verbally acknowledged these results.

## 2017-07-28 ENCOUNTER — Other Ambulatory Visit: Payer: Self-pay

## 2017-07-28 DIAGNOSIS — S92919A Unspecified fracture of unspecified toe(s), initial encounter for closed fracture: Secondary | ICD-10-CM | POA: Insufficient documentation

## 2017-07-28 DIAGNOSIS — B182 Chronic viral hepatitis C: Secondary | ICD-10-CM | POA: Insufficient documentation

## 2017-07-29 ENCOUNTER — Ambulatory Visit: Payer: Medicare Other | Admitting: Gastroenterology

## 2017-07-29 ENCOUNTER — Encounter: Payer: Self-pay | Admitting: Gastroenterology

## 2017-07-30 IMAGING — CR DG CHEST 1V PORT
1 series · 1 of 1 positions shown · non-contrast
Comparison: Radiograph dated 05/21/2015

CLINICAL DATA: 59-year-old female with central line placement.

EXAM:
PORTABLE CHEST 1 VIEW

[AP]
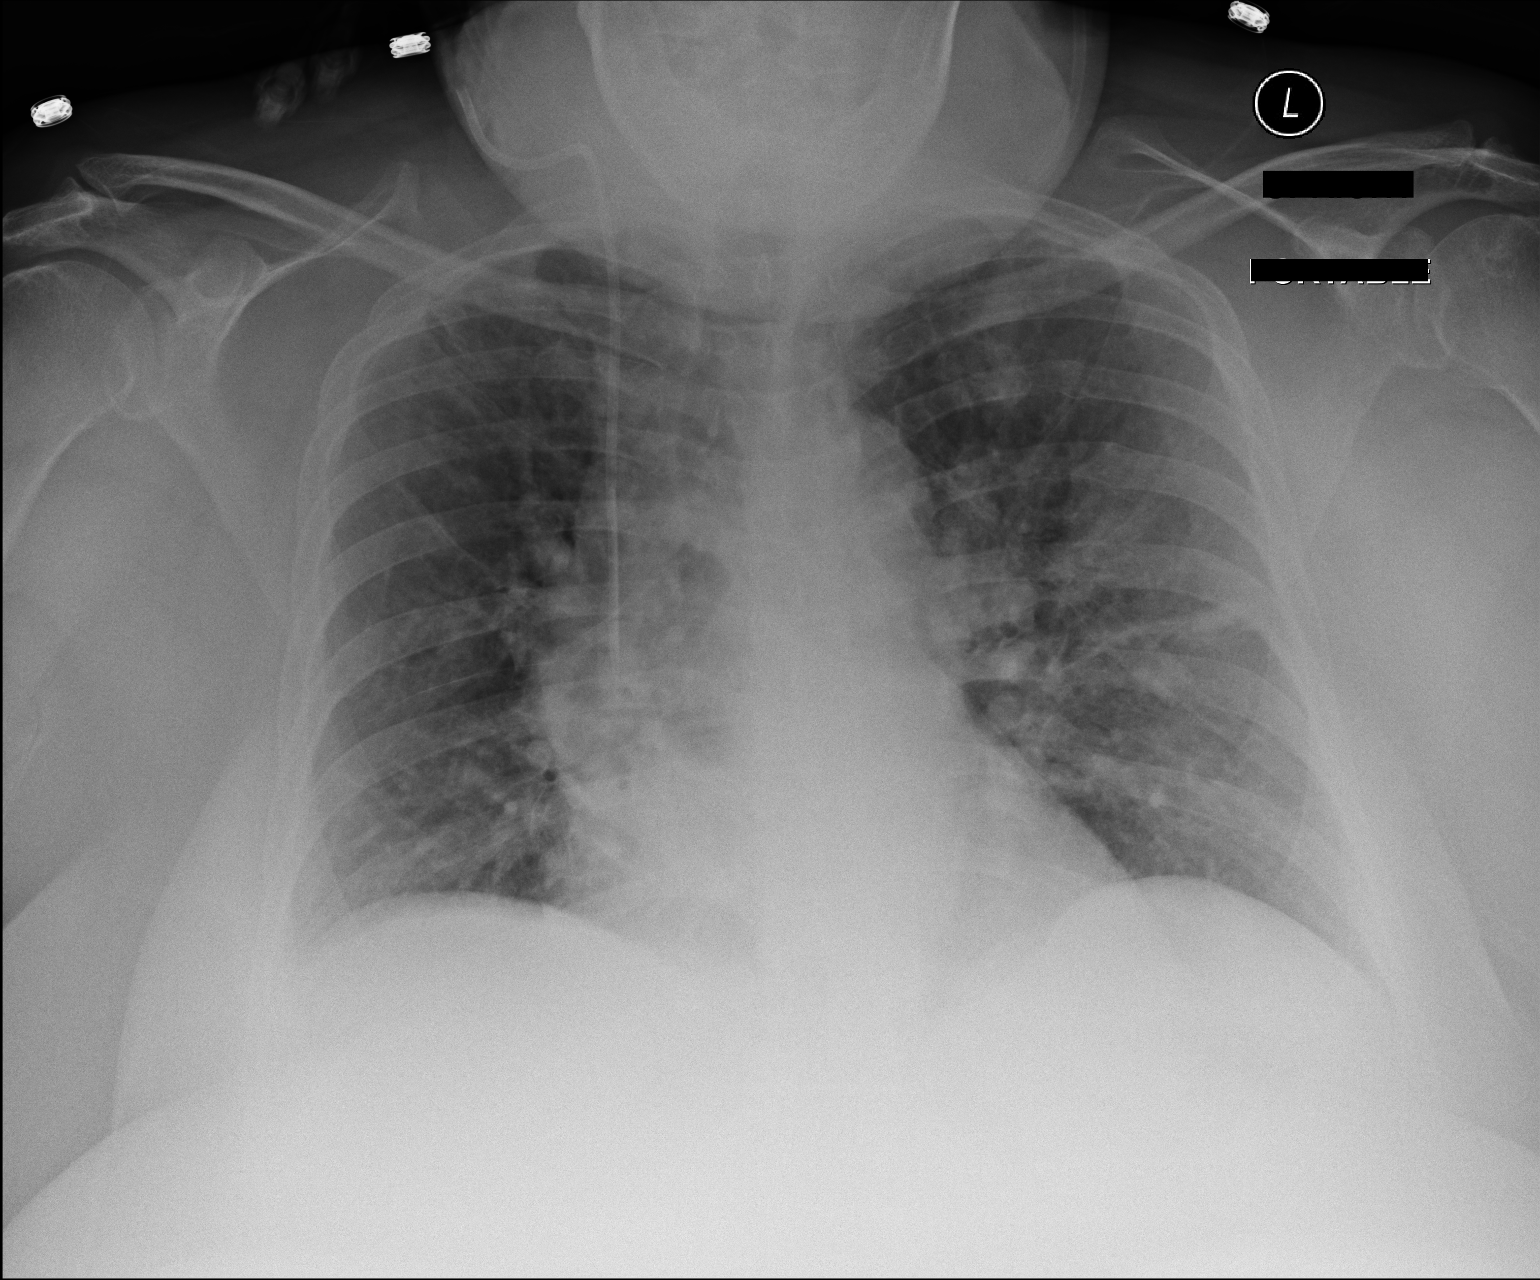

[1 of 1 positions shown; findings below may reference images not displayed]

FINDINGS: A right IJ central venous catheter with tip over central SVC is
noted. There is no pneumothorax. Mild central vascular prominence
may represent a degree of congestion. Linear left mid lung field
atelectasis/scarring. No focal consolidation, or pleural effusion
noted. There is stable cardiac silhouette. The osseous structures
appear unremarkable.
IMPRESSION: Right IJ central venous line with tip over central SVC. No
pneumothorax.

Probable mild congestive changes.  No focal consolidation.

## 2017-08-01 IMAGING — CT CT NECK W/O CM
3 of 4 series · 15 of 33 positions shown, 18 images · non-contrast
Comparison: CT head 08/16/2015.

CLINICAL DATA: Recent craniotomy for subdural hematoma. Right-sided
parietal headache. Swelling and redness and pain to the right
mandible.

EXAM:
CT NECK WITHOUT CONTRAST
TECHNIQUE: Multidetector CT imaging of the neck was performed following the
standard protocol without intravenous contrast.

[Series 4: c_spine 2.0 st · axial · 0.39mm/px · z∈[-199,-81]mm · 7 of 77 slices shown, 9 images]
[im 9/77  soft-tissue]
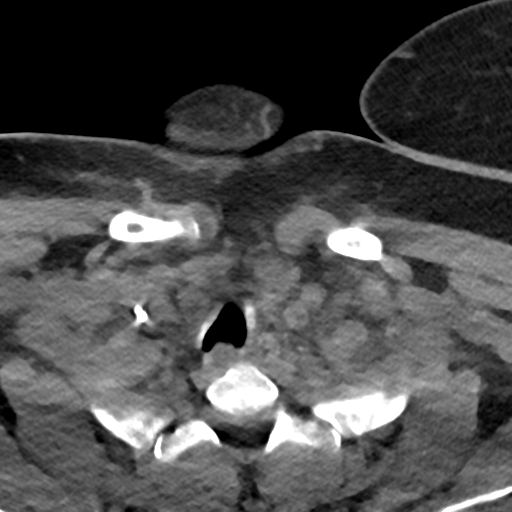
[im 9/77  bone]
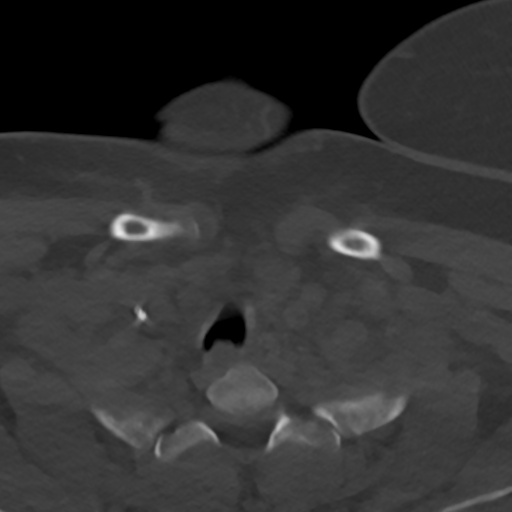
[im 17/77  bone]
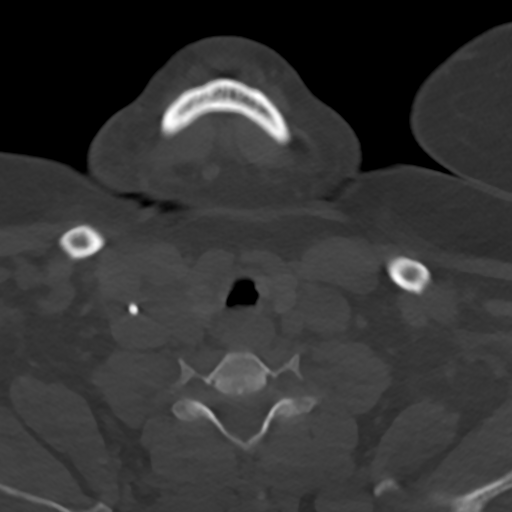
[im 26/77  bone]
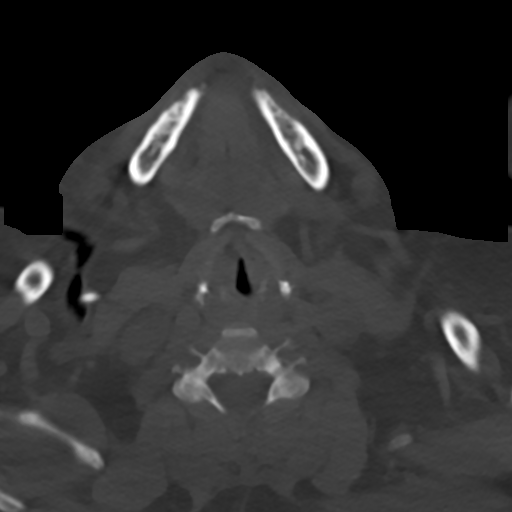
[im 43/77  bone]
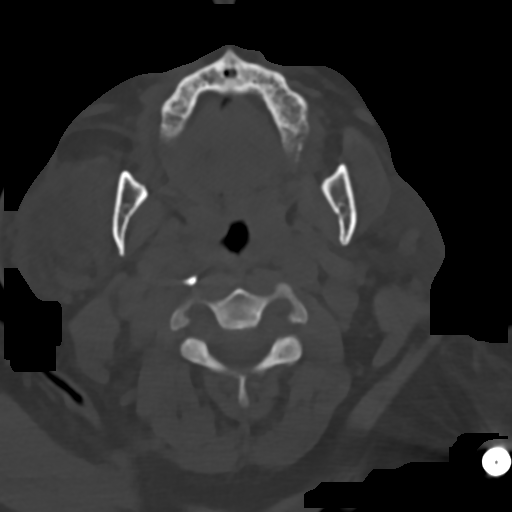
[im 51/77  soft-tissue]
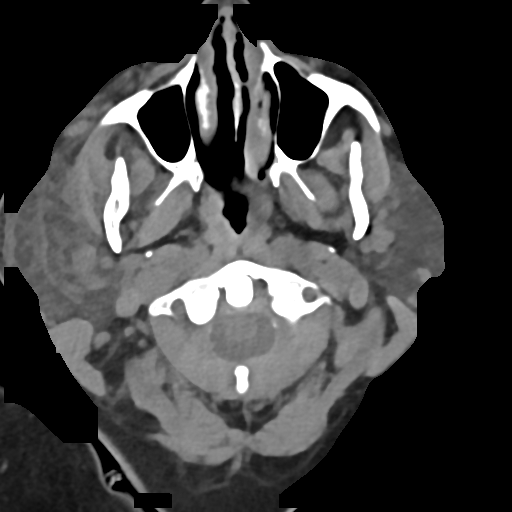
[im 51/77  bone]
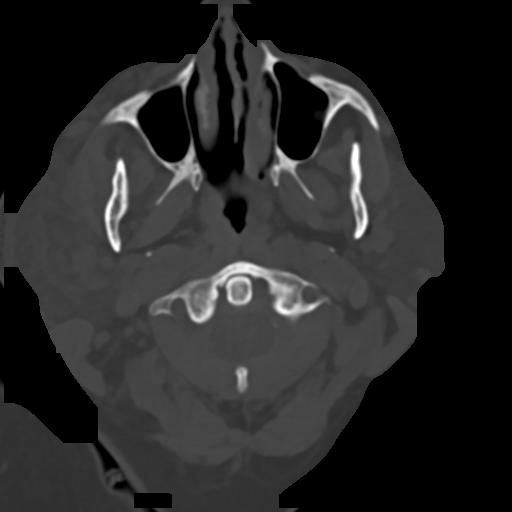
[im 60/77  bone]
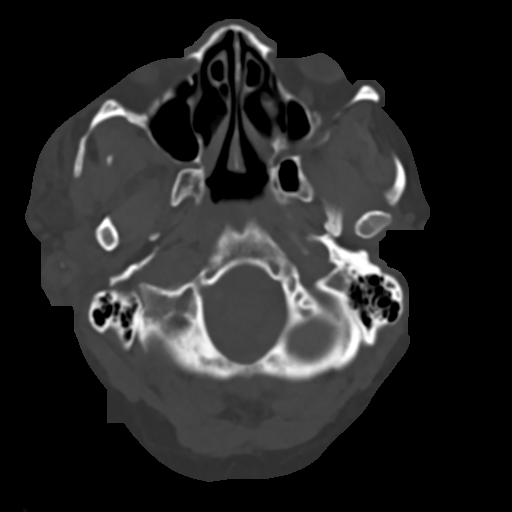
[im 68/77  bone]
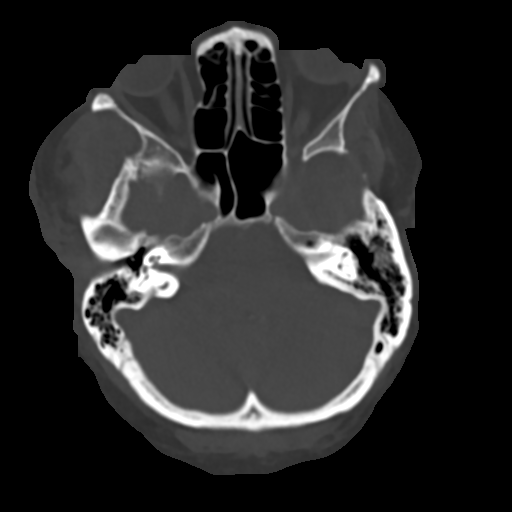

[Series 602: sagital neck s.t. · sagittal · 0.59mm/px · 5 of 96 slices shown, 6 images]
[im 32/96  bone]
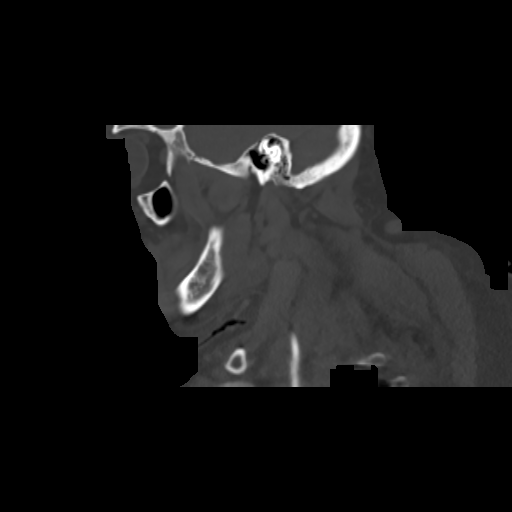
[im 40/96  bone]
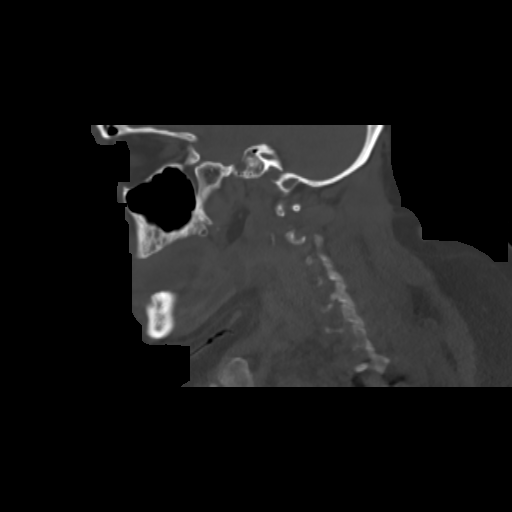
[im 48/96  soft-tissue]
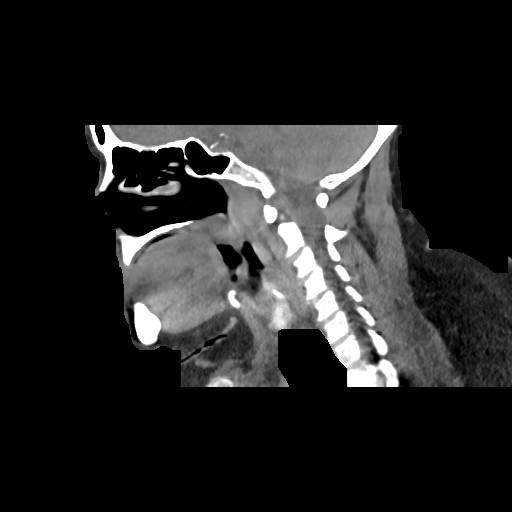
[im 48/96  bone]
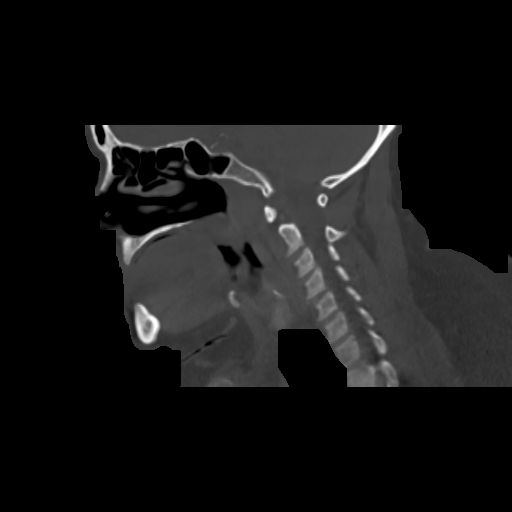
[im 56/96  bone]
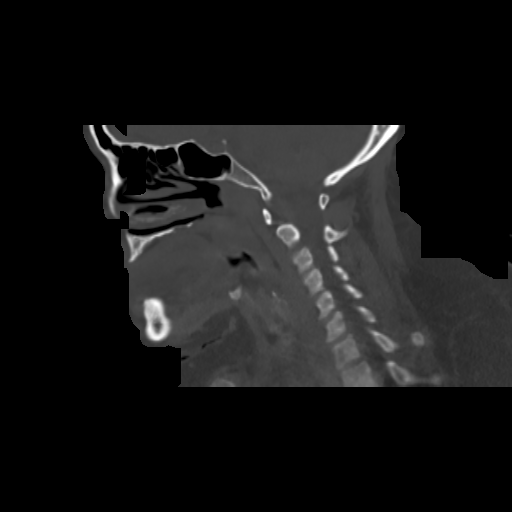
[im 64/96  bone]
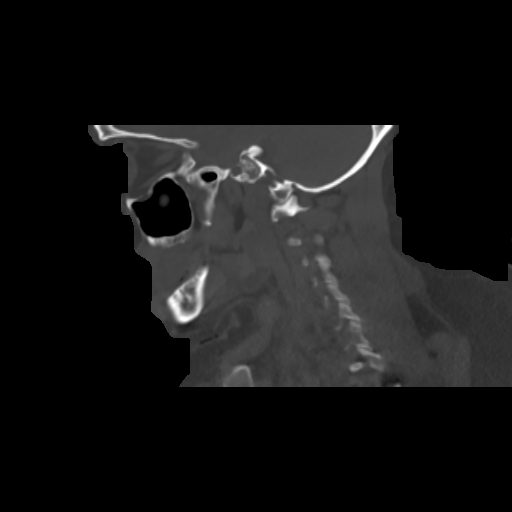

[Series 603: coronal neck s.t. · coronal · 0.59mm/px · 3 of 117 slices shown]
[im 24/117  bone]
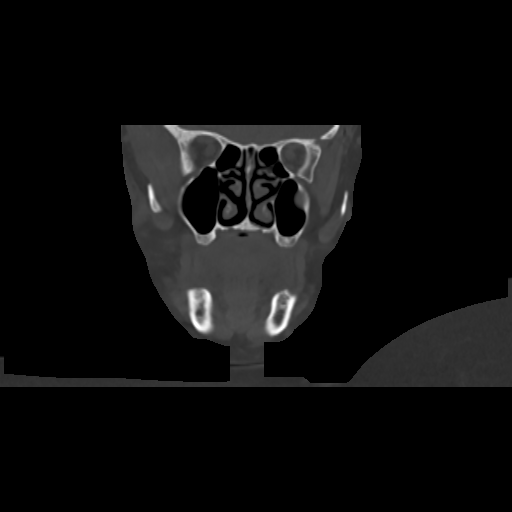
[im 47/117  bone]
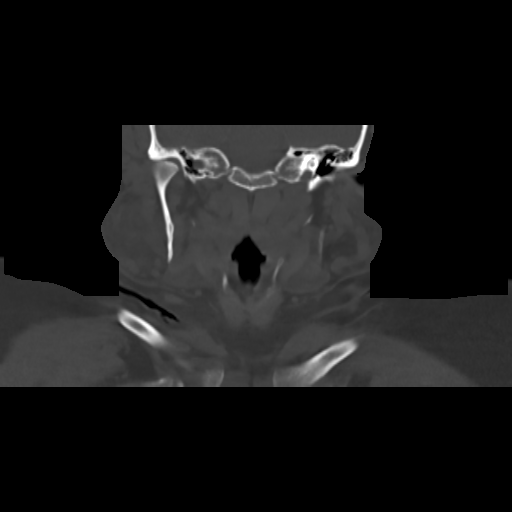
[im 70/117  bone]
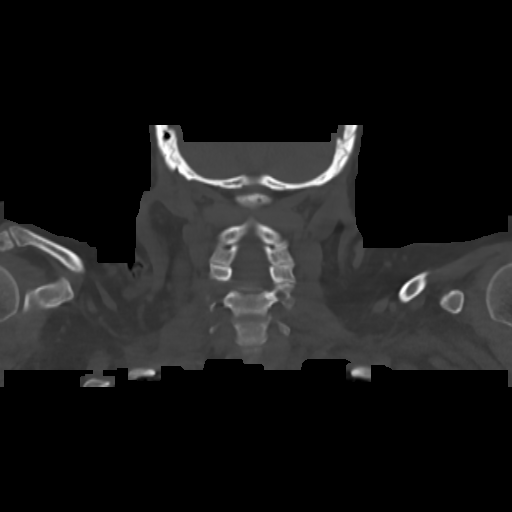

[15 of 33 positions shown; findings below may reference images not displayed]

FINDINGS: Pharynx and larynx: No focal mucosal or submucosal lesions are
evident. Vocal cords are midline and symmetric. The tongue base is
within normal limits. There is asymmetric stranding within the right
parapharyngeal fat.

Salivary glands: There is marked edematous change in stranding about
the right parotid gland. Enlarged intra parotid and periparotid
lymph nodes appear reactive. They left parotid gland is within
normal limits. The submandibular glands are normal bilaterally.

Thyroid: Within normal limits

Lymph nodes: Enlarged right level 2 periparotid lymph nodes appear
reactive. Right submandibular nodes are present as well. No
significant left-sided cervical adenopathy is present.

Vascular: A right IJ catheter is in place. No significant
atherosclerotic disease is evident.

Limited intracranial: The left craniotomy is again noted. Edema
within the left frontal lobe and left temporal tip are again noted.
Previously-seen hemorrhage has been evacuated. No new hemorrhage is
evident.

Visualized orbits: Within normal limits

Mastoids and visualized paranasal sinuses: A polyp or mucous
retention cyst is again noted in the left maxillary sinus.

Skeleton: There straightening of the normal cervical lordosis. The
patient is edentulous. No focal lytic or blastic lesions are
present.

Upper chest: Is patchy bilateral ground-glass attenuation is
present. There is a small effusion on the right.
IMPRESSION: 1. Marked inflammatory changes appear to be centered in the right
parotid gland. There is no focal mass lesion or obstruction. The
findings are compatible with a nonspecific parotitis.
2. Right level 1 and level 2 lymph nodes appear reactive.
3. Postsurgical changes of left craniotomy.
4. Right IJ line.
5. Patchy ground-glass attenuation the lung apices bilaterally
concerning for edema or infection.

## 2017-08-01 IMAGING — CT CT HEAD W/O CM
2 series · 15 of 30 positions shown, 17 images · non-contrast
Comparison: 08/16/2015 head CT

CLINICAL DATA: Subdural hematoma.  Pain in the lower jaw

EXAM:
CT HEAD WITHOUT CONTRAST
TECHNIQUE: Contiguous axial images were obtained from the base of the skull
through the vertex without intravenous contrast.

[Series 2: head without · axial · non-contrast · 0.44mm/px · z∈[-73,+32]mm · 7 of 29 slices shown, 9 images]
[im 4/29  brain]
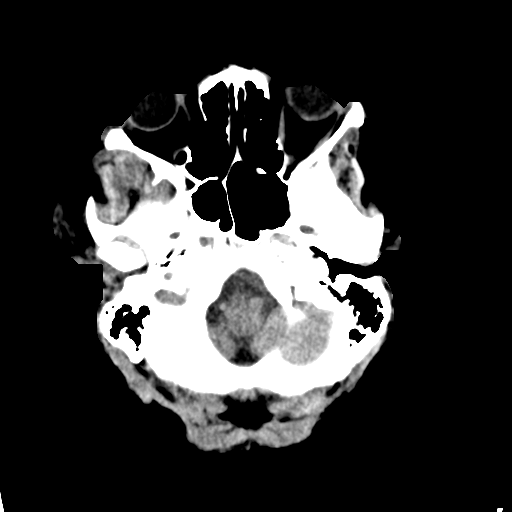
[im 4/29  bone]
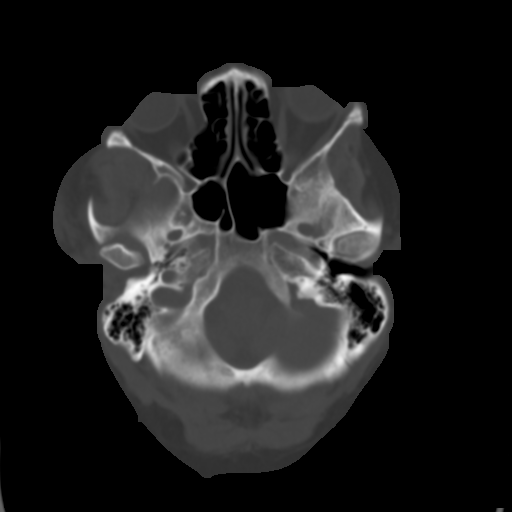
[im 8/29  brain]
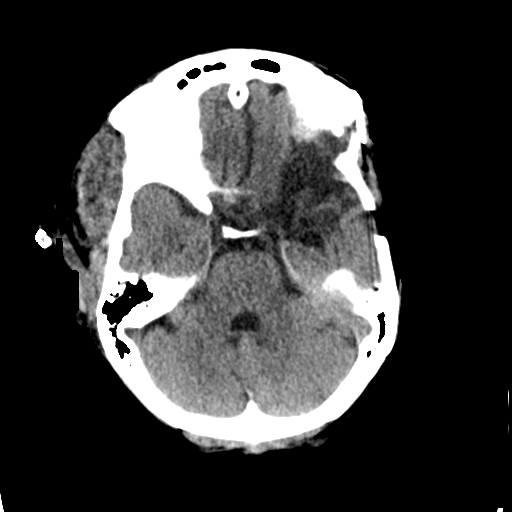
[im 11/29  brain]
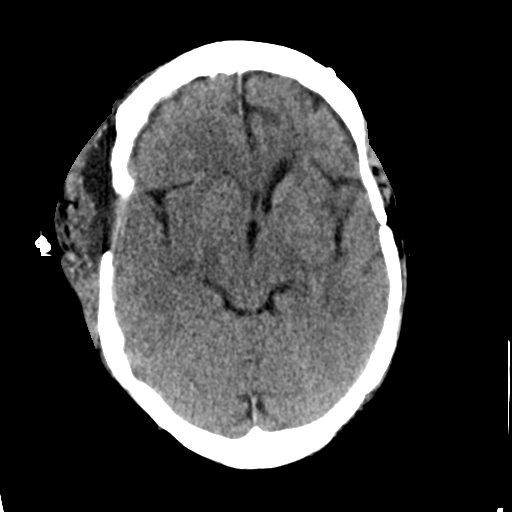
[im 15/29  brain]
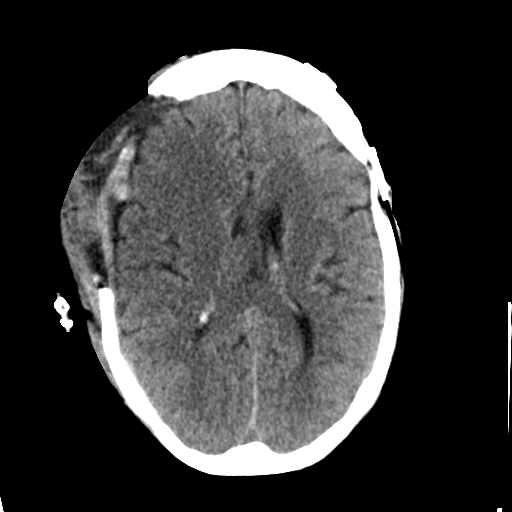
[im 18/29  brain]
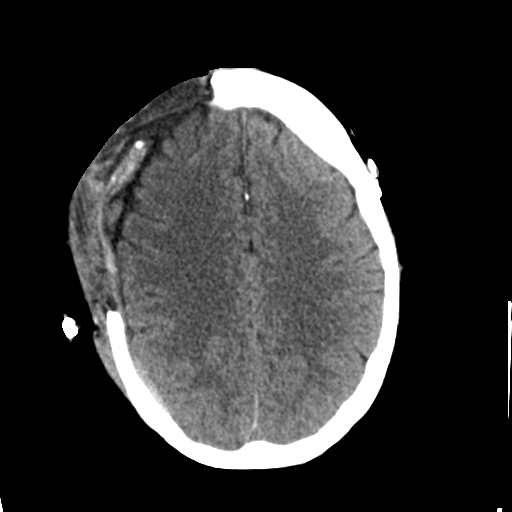
[im 18/29  bone]
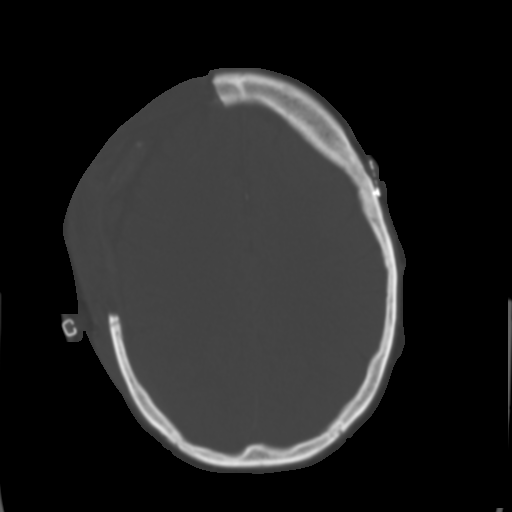
[im 22/29  brain]
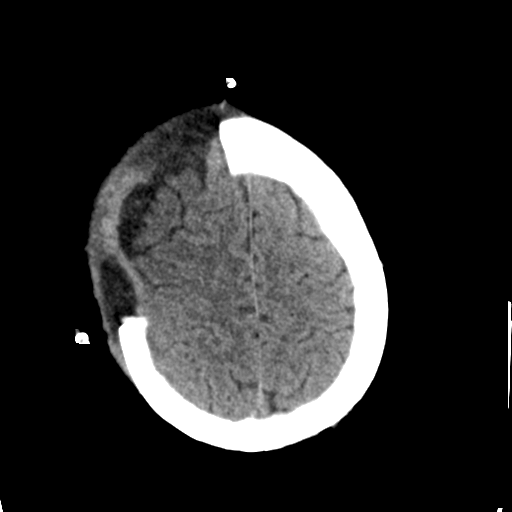
[im 25/29  brain]
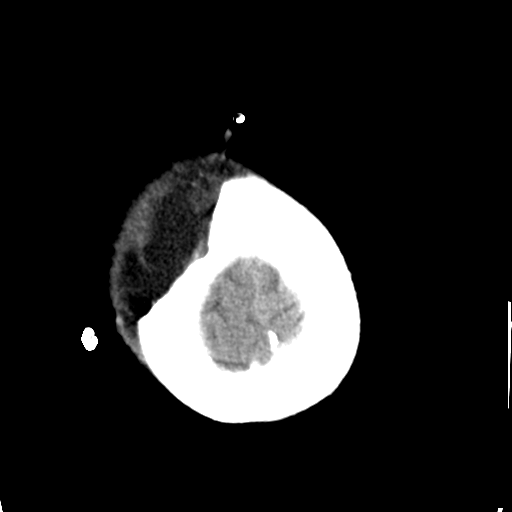

[Series 3: head bone · axial · 0.44mm/px · z∈[-74,+38]mm · 8 of 72 slices shown]
[im 8/72  bone]
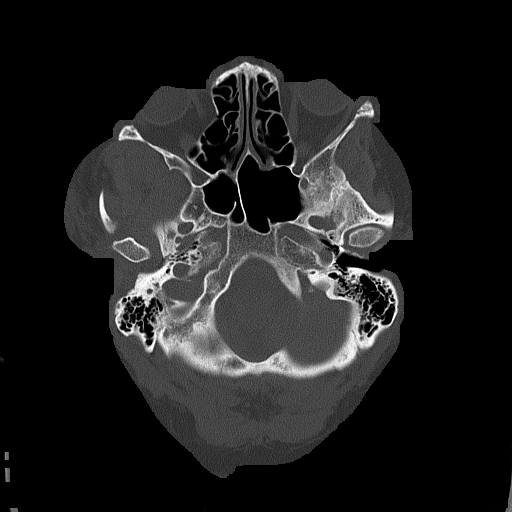
[im 15/72  bone]
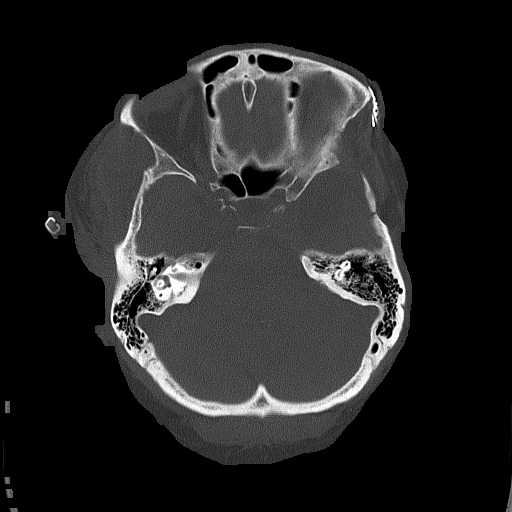
[im 22/72  bone]
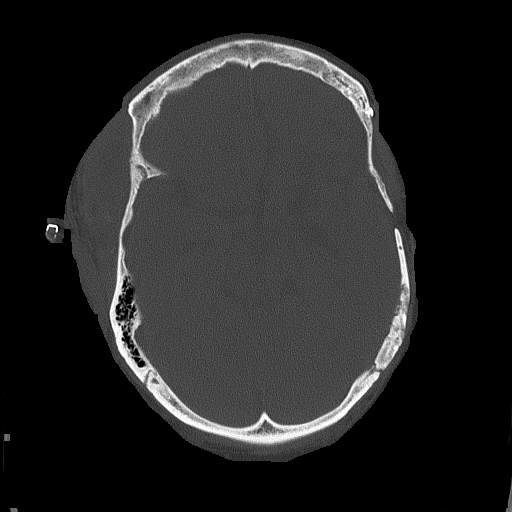
[im 32/72  bone]
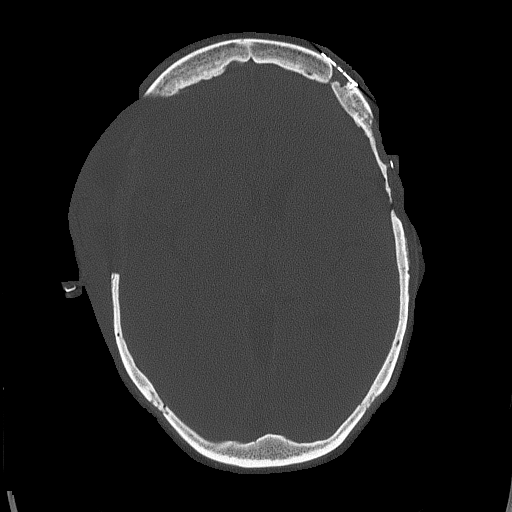
[im 40/72  bone]
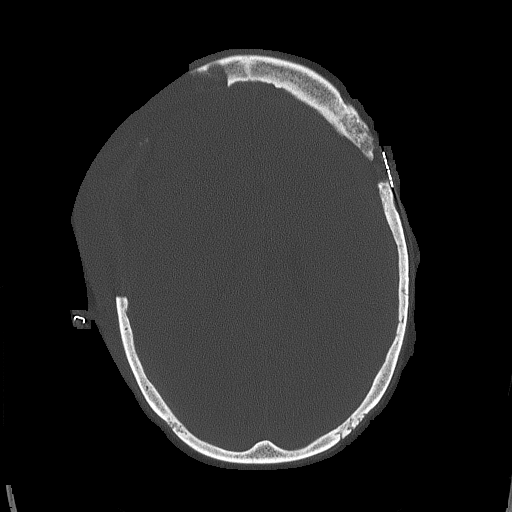
[im 50/72  bone]
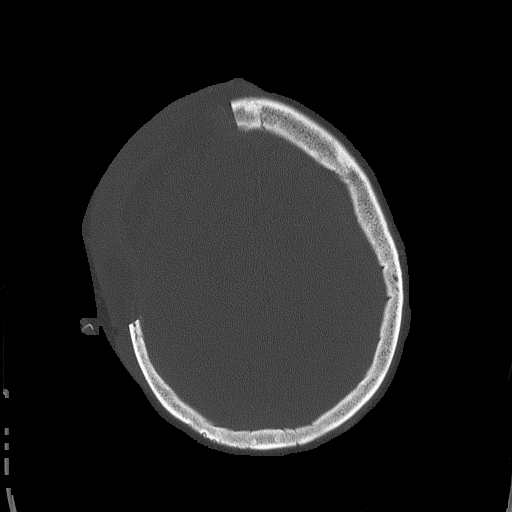
[im 57/72  bone]
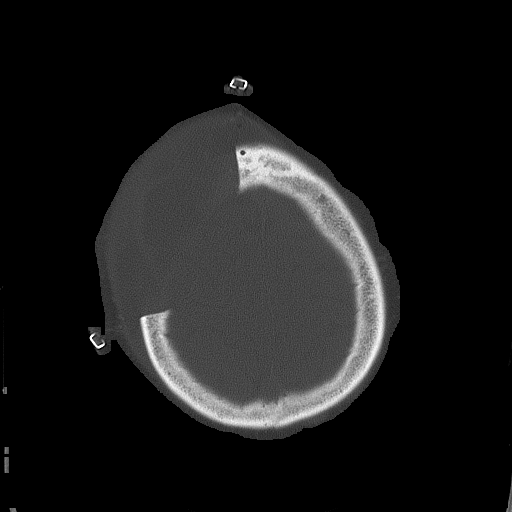
[im 64/72  bone]
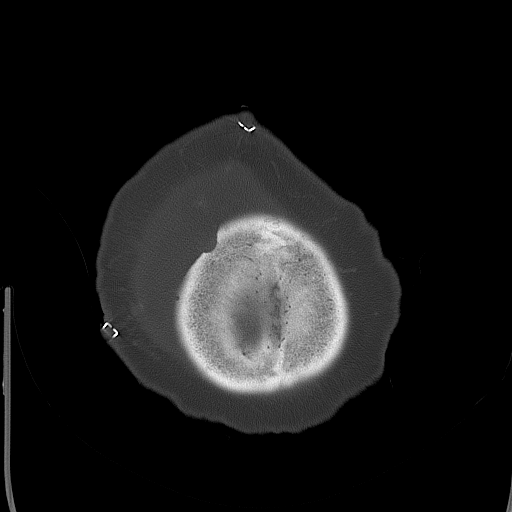

[15 of 30 positions shown; findings below may reference images not displayed]

FINDINGS: Skull and Sinuses:Wide craniectomy on the right. The scalp and
bulging meninges are thickened, as expected postoperatively. There
is likely tracking CSF into the scalp deep to the temporalis.

Visualized orbits: Negative.

Brain: Excellent evacuation of right subdural hematoma with
significantly improved midline shift (some shift related to volume
loss on the left as seen on 6328 study). Midline shift measures 5 mm
at maximum today at the anterior septum pellucidum. There is no
intraventricular or subarachnoid hemorrhage. Edematous appearance of
brain deep to the craniectomy but no convincing postoperative
infarct. Stable encephalomalacia and gliosis deep to the left
pterional craniotomy, mainly affecting the temporal pole and frontal
operculum.
IMPRESSION: 1. Evacuation of right subdural hematoma with significantly improved
shift.
2. Bulging meninges/pseudomeningocele at the craniectomy.

## 2017-08-14 ENCOUNTER — Other Ambulatory Visit: Payer: Self-pay

## 2017-08-14 ENCOUNTER — Observation Stay
Admission: EM | Admit: 2017-08-14 | Discharge: 2017-08-17 | Disposition: A | Discharge status: Home / Self Care | Payer: Medicare Other | Attending: Internal Medicine | Admitting: Internal Medicine

## 2017-08-14 ENCOUNTER — Encounter: Payer: Self-pay | Admitting: Emergency Medicine

## 2017-08-14 DIAGNOSIS — K625 Hemorrhage of anus and rectum: Secondary | ICD-10-CM | POA: Diagnosis not present

## 2017-08-14 DIAGNOSIS — K7030 Alcoholic cirrhosis of liver without ascites: Secondary | ICD-10-CM | POA: Diagnosis not present

## 2017-08-14 DIAGNOSIS — Z79899 Other long term (current) drug therapy: Secondary | ICD-10-CM | POA: Insufficient documentation

## 2017-08-14 DIAGNOSIS — G40909 Epilepsy, unspecified, not intractable, without status epilepticus: Secondary | ICD-10-CM | POA: Insufficient documentation

## 2017-08-14 DIAGNOSIS — K921 Melena: Principal | ICD-10-CM | POA: Insufficient documentation

## 2017-08-14 DIAGNOSIS — Z880 Allergy status to penicillin: Secondary | ICD-10-CM | POA: Diagnosis not present

## 2017-08-14 DIAGNOSIS — E1140 Type 2 diabetes mellitus with diabetic neuropathy, unspecified: Secondary | ICD-10-CM | POA: Diagnosis not present

## 2017-08-14 DIAGNOSIS — Z882 Allergy status to sulfonamides status: Secondary | ICD-10-CM | POA: Diagnosis not present

## 2017-08-14 DIAGNOSIS — Z885 Allergy status to narcotic agent status: Secondary | ICD-10-CM | POA: Insufficient documentation

## 2017-08-14 DIAGNOSIS — E1169 Type 2 diabetes mellitus with other specified complication: Secondary | ICD-10-CM | POA: Diagnosis not present

## 2017-08-14 DIAGNOSIS — I130 Hypertensive heart and chronic kidney disease with heart failure and stage 1 through stage 4 chronic kidney disease, or unspecified chronic kidney disease: Secondary | ICD-10-CM | POA: Diagnosis not present

## 2017-08-14 DIAGNOSIS — F17210 Nicotine dependence, cigarettes, uncomplicated: Secondary | ICD-10-CM | POA: Diagnosis not present

## 2017-08-14 DIAGNOSIS — N189 Chronic kidney disease, unspecified: Secondary | ICD-10-CM | POA: Insufficient documentation

## 2017-08-14 DIAGNOSIS — E1122 Type 2 diabetes mellitus with diabetic chronic kidney disease: Secondary | ICD-10-CM | POA: Insufficient documentation

## 2017-08-14 DIAGNOSIS — Z8673 Personal history of transient ischemic attack (TIA), and cerebral infarction without residual deficits: Secondary | ICD-10-CM | POA: Diagnosis not present

## 2017-08-14 DIAGNOSIS — Z23 Encounter for immunization: Secondary | ICD-10-CM | POA: Diagnosis not present

## 2017-08-14 DIAGNOSIS — J449 Chronic obstructive pulmonary disease, unspecified: Secondary | ICD-10-CM | POA: Diagnosis not present

## 2017-08-14 DIAGNOSIS — Z85841 Personal history of malignant neoplasm of brain: Secondary | ICD-10-CM | POA: Insufficient documentation

## 2017-08-14 DIAGNOSIS — Z881 Allergy status to other antibiotic agents status: Secondary | ICD-10-CM | POA: Diagnosis not present

## 2017-08-14 DIAGNOSIS — Z7984 Long term (current) use of oral hypoglycemic drugs: Secondary | ICD-10-CM | POA: Insufficient documentation

## 2017-08-14 DIAGNOSIS — F419 Anxiety disorder, unspecified: Secondary | ICD-10-CM | POA: Insufficient documentation

## 2017-08-14 DIAGNOSIS — K219 Gastro-esophageal reflux disease without esophagitis: Secondary | ICD-10-CM | POA: Diagnosis not present

## 2017-08-14 DIAGNOSIS — F329 Major depressive disorder, single episode, unspecified: Secondary | ICD-10-CM | POA: Diagnosis not present

## 2017-08-14 DIAGNOSIS — K922 Gastrointestinal hemorrhage, unspecified: Secondary | ICD-10-CM | POA: Diagnosis present

## 2017-08-14 LAB — CBC WITH DIFFERENTIAL/PLATELET
BASOS ABS: 0 10*3/uL (ref 0–0.1)
Basophils Relative: 1 %
EOS ABS: 0.2 10*3/uL (ref 0–0.7)
EOS PCT: 3 %
HCT: 35.5 % (ref 35.0–47.0)
Hemoglobin: 11.5 g/dL — ABNORMAL LOW (ref 12.0–16.0)
Lymphocytes Relative: 26 %
Lymphs Abs: 1.5 10*3/uL (ref 1.0–3.6)
MCH: 27.6 pg (ref 26.0–34.0)
MCHC: 32.4 g/dL (ref 32.0–36.0)
MCV: 85.2 fL (ref 80.0–100.0)
MONO ABS: 0.5 10*3/uL (ref 0.2–0.9)
Monocytes Relative: 9 %
Neutro Abs: 3.6 10*3/uL (ref 1.4–6.5)
Neutrophils Relative %: 61 %
PLATELETS: 146 10*3/uL — AB (ref 150–440)
RBC: 4.16 MIL/uL (ref 3.80–5.20)
RDW: 16.9 % — AB (ref 11.5–14.5)
WBC: 5.7 10*3/uL (ref 3.6–11.0)

## 2017-08-14 LAB — PROTIME-INR
INR: 1.24
Prothrombin Time: 15.5 seconds — ABNORMAL HIGH (ref 11.4–15.2)

## 2017-08-14 LAB — COMPREHENSIVE METABOLIC PANEL
ALT: 20 U/L (ref 14–54)
AST: 51 U/L — AB (ref 15–41)
Albumin: 3.3 g/dL — ABNORMAL LOW (ref 3.5–5.0)
Alkaline Phosphatase: 122 U/L (ref 38–126)
Anion gap: 12 (ref 5–15)
BILIRUBIN TOTAL: 0.9 mg/dL (ref 0.3–1.2)
BUN: 7 mg/dL (ref 6–20)
CO2: 19 mmol/L — ABNORMAL LOW (ref 22–32)
CREATININE: 0.74 mg/dL (ref 0.44–1.00)
Calcium: 9.3 mg/dL (ref 8.9–10.3)
Chloride: 111 mmol/L (ref 101–111)
GFR calc Af Amer: >60 mL/min (ref >60)
GLUCOSE: 136 mg/dL — AB (ref 65–99)
Potassium: 4.2 mmol/L (ref 3.5–5.1)
Sodium: 142 mmol/L (ref 135–145)
TOTAL PROTEIN: 7.9 g/dL (ref 6.5–8.1)

## 2017-08-14 LAB — GLUCOSE, CAPILLARY
GLUCOSE-CAPILLARY: 84 mg/dL (ref 65–99)
GLUCOSE-CAPILLARY: 126 mg/dL — AB (ref 65–99)
GLUCOSE-CAPILLARY: 213 mg/dL — AB (ref 65–99)

## 2017-08-14 LAB — IRON AND TIBC
Iron: 63 ug/dL (ref 28–170)
Saturation Ratios: 18 % (ref 10.4–31.8)
TIBC: 348 ug/dL (ref 250–450)
UIBC: 285 ug/dL

## 2017-08-14 LAB — HEMOGLOBIN AND HEMATOCRIT, BLOOD
HEMATOCRIT: 32.2 % — AB (ref 35.0–47.0)
HEMOGLOBIN: 10.2 g/dL — AB (ref 12.0–16.0)

## 2017-08-14 LAB — FERRITIN: Ferritin: 13 ng/mL (ref 11–307)

## 2017-08-14 LAB — HEMOGLOBIN: Hemoglobin: 10.8 g/dL — ABNORMAL LOW (ref 12.0–16.0)

## 2017-08-14 LAB — FOLATE: FOLATE: 10.1 ng/mL (ref >5.9)

## 2017-08-14 LAB — ETHANOL: Alcohol, Ethyl (B): 22 mg/dL — ABNORMAL HIGH (ref <10)

## 2017-08-14 LAB — LIPASE, BLOOD: LIPASE: 55 U/L — AB (ref 11–51)

## 2017-08-14 MED ORDER — TIOTROPIUM BROMIDE MONOHYDRATE 18 MCG IN CAPS
18.0000 ug | ORAL_CAPSULE | Freq: Every morning | RESPIRATORY_TRACT | Status: DC
  Administered 2017-08-14 – 2017-08-17 (×4): 18 ug via RESPIRATORY_TRACT
  Filled 2017-08-14: qty 5

## 2017-08-14 MED ORDER — GABAPENTIN 600 MG PO TABS
600.0000 mg | ORAL_TABLET | Freq: Four times a day (QID) | ORAL | Status: DC
  Administered 2017-08-14 (×4): 600 mg via ORAL
  Filled 2017-08-14 (×9): qty 1

## 2017-08-14 MED ORDER — DARIFENACIN HYDROBROMIDE ER 7.5 MG PO TB24
7.5000 mg | ORAL_TABLET | Freq: Every day | ORAL | Status: DC
  Administered 2017-08-14: 7.5 mg via ORAL
  Filled 2017-08-14 (×2): qty 1

## 2017-08-14 MED ORDER — DIPHENHYDRAMINE HCL 25 MG PO CAPS: 25.0000 mg | ORAL_CAPSULE | Freq: Three times a day (TID) | ORAL | Status: DC | PRN

## 2017-08-14 MED ORDER — SODIUM CHLORIDE 0.9 % IV BOLUS (SEPSIS)
1000.0000 mL | Freq: Once | INTRAVENOUS | Status: AC
  Administered 2017-08-14: 1000 mL via INTRAVENOUS

## 2017-08-14 MED ORDER — SODIUM CHLORIDE 0.9 % IV SOLN
INTRAVENOUS | Status: DC
  Administered 2017-08-14 – 2017-08-16 (×6): via INTRAVENOUS

## 2017-08-14 MED ORDER — SUCRALFATE 1 G PO TABS
1.0000 g | ORAL_TABLET | Freq: Four times a day (QID) | ORAL | Status: DC
  Administered 2017-08-14 (×4): 1 g via ORAL
  Filled 2017-08-14 (×3): qty 1

## 2017-08-14 MED ORDER — ALBUTEROL SULFATE (2.5 MG/3ML) 0.083% IN NEBU: 2.5000 mg | INHALATION_SOLUTION | Freq: Four times a day (QID) | RESPIRATORY_TRACT | Status: DC | PRN

## 2017-08-14 MED ORDER — INSULIN ASPART 100 UNIT/ML ~~LOC~~ SOLN
0.0000 [IU] | Freq: Three times a day (TID) | SUBCUTANEOUS | Status: DC
  Administered 2017-08-14 – 2017-08-16 (×2): 1 [IU] via SUBCUTANEOUS
  Administered 2017-08-17: 2 [IU] via SUBCUTANEOUS
  Filled 2017-08-14 (×3): qty 1

## 2017-08-14 MED ORDER — MORPHINE SULFATE (PF) 4 MG/ML IV SOLN
4.0000 mg | Freq: Once | INTRAVENOUS | Status: AC
  Administered 2017-08-14: 4 mg via INTRAVENOUS
  Filled 2017-08-14: qty 1

## 2017-08-14 MED ORDER — AMITRIPTYLINE HCL 50 MG PO TABS
50.0000 mg | ORAL_TABLET | Freq: Every day | ORAL | Status: DC
  Administered 2017-08-14 – 2017-08-16 (×3): 50 mg via ORAL
  Filled 2017-08-14: qty 2
  Filled 2017-08-14 (×2): qty 1
  Filled 2017-08-14 (×2): qty 2
  Filled 2017-08-14 (×2): qty 1

## 2017-08-14 MED ORDER — SODIUM CHLORIDE 0.9 % IV SOLN
50.0000 ug/h | INTRAVENOUS | Status: DC
  Administered 2017-08-14 – 2017-08-17 (×7): 50 ug/h via INTRAVENOUS
  Filled 2017-08-14 (×13): qty 1

## 2017-08-14 MED ORDER — PHENYTOIN 125 MG/5ML PO SUSP
300.0000 mg | Freq: Two times a day (BID) | ORAL | Status: DC
  Administered 2017-08-14 – 2017-08-17 (×6): 300 mg via ORAL
  Filled 2017-08-14 (×9): qty 12

## 2017-08-14 MED ORDER — OXYCODONE HCL 5 MG PO TABS
10.0000 mg | ORAL_TABLET | Freq: Three times a day (TID) | ORAL | Status: DC | PRN
  Administered 2017-08-14 – 2017-08-17 (×7): 10 mg via ORAL
  Filled 2017-08-14 (×9): qty 2

## 2017-08-14 MED ORDER — CITALOPRAM HYDROBROMIDE 20 MG PO TABS
20.0000 mg | ORAL_TABLET | Freq: Every day | ORAL | Status: DC
  Administered 2017-08-14 – 2017-08-17 (×3): 20 mg via ORAL
  Filled 2017-08-14 (×3): qty 1

## 2017-08-14 MED ORDER — LEVETIRACETAM 500 MG PO TABS
500.0000 mg | ORAL_TABLET | Freq: Two times a day (BID) | ORAL | Status: DC
  Administered 2017-08-14 – 2017-08-17 (×6): 500 mg via ORAL
  Filled 2017-08-14 (×8): qty 1

## 2017-08-14 MED ORDER — SODIUM CHLORIDE 0.9% FLUSH
10.0000 mL | Freq: Two times a day (BID) | INTRAVENOUS | Status: DC
  Administered 2017-08-14: 30 mL
  Administered 2017-08-14 – 2017-08-17 (×6): 10 mL

## 2017-08-14 MED ORDER — ZOLPIDEM TARTRATE 5 MG PO TABS
5.0000 mg | ORAL_TABLET | Freq: Every evening | ORAL | Status: DC | PRN
  Administered 2017-08-14 – 2017-08-16 (×3): 5 mg via ORAL
  Filled 2017-08-14 (×3): qty 1

## 2017-08-14 MED ORDER — DOCUSATE SODIUM 100 MG PO CAPS: 100.0000 mg | ORAL_CAPSULE | Freq: Two times a day (BID) | ORAL | Status: DC | PRN

## 2017-08-14 MED ORDER — PRAVASTATIN SODIUM 10 MG PO TABS
10.0000 mg | ORAL_TABLET | Freq: Every day | ORAL | Status: DC
  Administered 2017-08-14 – 2017-08-17 (×3): 10 mg via ORAL
  Filled 2017-08-14 (×4): qty 1

## 2017-08-14 MED ORDER — SODIUM CHLORIDE 0.9% FLUSH: 10.0000 mL | INTRAVENOUS | Status: DC | PRN

## 2017-08-14 MED ORDER — BUSPIRONE HCL 10 MG PO TABS
10.0000 mg | ORAL_TABLET | Freq: Every day | ORAL | Status: DC
  Administered 2017-08-14 – 2017-08-17 (×3): 10 mg via ORAL
  Filled 2017-08-14 (×3): qty 1

## 2017-08-14 MED ORDER — PANTOPRAZOLE SODIUM 40 MG IV SOLR
40.0000 mg | Freq: Two times a day (BID) | INTRAVENOUS | Status: DC
  Administered 2017-08-14 – 2017-08-17 (×6): 40 mg via INTRAVENOUS
  Filled 2017-08-14 (×7): qty 40

## 2017-08-14 MED ORDER — POLYETHYLENE GLYCOL 3350 17 GM/SCOOP PO POWD
1.0000 | Freq: Once | ORAL | Status: AC
  Administered 2017-08-14: 255 g via ORAL
  Filled 2017-08-14: qty 255

## 2017-08-14 MED ORDER — ALPRAZOLAM 1 MG PO TABS
1.0000 mg | ORAL_TABLET | Freq: Two times a day (BID) | ORAL | Status: DC | PRN
  Administered 2017-08-15 – 2017-08-16 (×3): 1 mg via ORAL
  Filled 2017-08-14 (×3): qty 1

## 2017-08-14 MED ORDER — MOMETASONE FURO-FORMOTEROL FUM 200-5 MCG/ACT IN AERO
2.0000 | INHALATION_SPRAY | Freq: Two times a day (BID) | RESPIRATORY_TRACT | Status: DC
  Administered 2017-08-14 – 2017-08-17 (×7): 2 via RESPIRATORY_TRACT
  Filled 2017-08-14: qty 8.8

## 2017-08-14 NOTE — ED Notes (Signed)
Lab called and said pt has positive antibodies from type and screen and it will delay process of blood work.

## 2017-08-14 NOTE — H&P (View-Only) (Signed)
[name], MD [address]  [place]  [place], [place] [address]  Main: [phone]  Fax: [phone] Pager: [phone]   Consultation  Referring Provider:     No ref. provider found Primary Care Physician:  System, Pcp Not In Primary Gastroenterologist:  Dr. Sherri Sear         Reason for Consultation:     Rectal bleeding  Date of Admission:  08/14/2017 Date of Consultation:  08/14/2017         HPI:   [name] is a 62 y.o. female with multiple comorbidities as listed below, significant for cirrhosis, well compensated, secondary to hepatitis C status post SVR after treatment with harvoni, CHF who had recurrent admissions within last 1 month secondary to several episodes of rectal bleeding. 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 She presented today with episodes of passing blood clots per rectum. Her hemoglobin was 11.5 early this morning, repeat came down to 10.2 which is actually her baseline. She reports that she had another episode on the floor. She is kept nothing by mouth. She denies abdominal pain, nausea, vomiting,  hematemesis, melena or constipation or diarrhea.  She has prior history of rectovaginal fistula of unclear etiology, underwent temporary colostomy followed by colorectal anastomosis. 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 She denies any upper or lower GI symptoms otherwise.   NSAIDs: None  Antiplts/Anticoagulants/Anti thrombotics: none   GI Procedures:   EGD 07/17/2017 - Normal first portion of the duodenum and second portion of the duodenum. - Portal hypertensive gastropathy. - Small (< 5 mm) esophageal varices. - Normal gastroesophageal junction.  Colonoscopy 07/17/2017 - Liquid brown stool in the entire colon - Preparation of the colon was  fair. - Congested mucosa in the entire examined colon c/w portal colopathy. - Rectal bleeding is secondary to portal colopathy involving rectum  EGD in 2014 which revealed normal esophagus and stomach, small duodenal polyp.  Diagnosis:  DUODENAL POLYP COLD BIOPSY:  - DUODENAL MUCOSA WITH BRUNNER GLAND HYPERPLASIA, ACTIVE  INFLAMMATION AND FEATURES SUGGESTIVE OF PEPTIC DUODENITIS.  - NEGATIVE FOR DYSPLASIA AND MALIGNANCY.   EGD in 11/2013  Diagnosis:  GASTRIC BIOPSY:  - OXYNTIC MUCOSA WITH MILD REACTIVE GASTROPATHY.  - NO ACTIVE INFLAMMATION, INTESTINAL METAPLASIA, OR ATROPHY.  - NO DYSPLASIA OR MALIGNANCY.  - NO HELICOBACTER PYLORI IDENTIFIED IN HEMATOXYLIN AND EOSIN  SECTIONS.   EGD in 04/2015 at [facility] - Grade I esophageal varices.  - Bilious gastric fluid.  - Portal hypertensive gastropathy.  - A single duodenal bulb polyp. Biopsied. 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  - NEGATIVE FOR HIGH GRADE DYSPLASIA AND MALIGNANCY.   Colonoscopy at [facility] in 03/2015 Impression:- The entire examined colon is normal.  - The ileocecal valve is normal.  - No specimens collected.  - Congested mucosa in the rectum. Thinning of the   rectovaginal septum on bimanual exam but no definite   rectovaginal fistula detected.  - The rectum is normal.   Past Medical History:  Diagnosis Date  . Allergy   . Anemia   . Anxiety   . CHF (congestive heart  failure) ([place])   . Chronic bronchitis ([place])   . Chronic kidney disease   . Cirrhosis ([place])   . Depression   . Diabetes mellitus  without complication ([place])   . Diverticulitis   . Emphysema of lung ([place])   . GERD (gastroesophageal reflux disease)   . H/O drug abuse    Clean since 2009  . H/O ETOH abuse    Sober since 2009  . Hepatitis C   . History of sepsis 2016  . Hypertension   . Malignant brain tumor ([place]) 2007  . Migraines   . Pancreatic ascites   . Pancreatitis, alcoholic 7989  . Rectovaginal fistula   . Seizures [facility])    Last seizure October 2017  . Spleen enlarged   . Stroke [facility]) 2007, 2008   during brain surgery  . Urine incontinence     Past Surgical History:  Procedure Laterality Date  . ABDOMINAL HYSTERECTOMY  1979  . APPENDECTOMY  1999  . BRAIN SURGERY  2007   malignant brain tumor  . CHOLECYSTECTOMY  2001  . COLON SURGERY  2006  . COLONOSCOPY N/A 07/17/2017   Procedure: COLONOSCOPY;  Surgeon: Lin Landsman, MD;  Location: [facility] ENDOSCOPY;  Service: Gastroenterology;  Laterality: N/A;  . CRANIOPLASTY N/A 09/15/2015   Procedure: Cranioplasty with retrieval of bone flap from abdominal pocket;  Surgeon: Ashok Pall, MD;  Location: [place] NEURO ORS;  Service: Neurosurgery;  Laterality: N/A;  Cranioplasty with retrieval of bone flap from abdominal pocket  . CRANIOTOMY Right 08/16/2015   Procedure: Right Fronto-Temporal-Parietal Craniotomy for Evacuation of Hematoma with Bone Flap Placement in Abdomen;  Surgeon: Ashok Pall, MD;  Location: [place] NEURO ORS;  Service: Neurosurgery;  Laterality: Right;  . CRANIOTOMY N/A 12/28/2015   Procedure: Craniectomy for wound debridement;  Surgeon: Ashok Pall, MD;  Location: [place] NEURO ORS;  Service: Neurosurgery;  Laterality: N/A;  Craniectomy for wound debridement  . DILATION AND CURETTAGE OF UTERUS    . ESOPHAGOGASTRODUODENOSCOPY N/A 07/17/2017   Procedure: ESOPHAGOGASTRODUODENOSCOPY (EGD);  Surgeon: Lin Landsman, MD;  Location: [facility] ENDOSCOPY;  Service: Gastroenterology;  Laterality: N/A;  . EYE SURGERY    . JOINT REPLACEMENT Left    TOTAL KNEE  REPLACEMENT  . rectovaginal fistula repair w/ colostomy  2011  . REPLACEMENT TOTAL KNEE Left 2005    Prior to Admission medications   Medication Sig Start Date End Date Taking? Authorizing Provider  albuterol (PROAIR HFA) 108 (90 Base) MCG/ACT inhaler ProAir HFA 90 mcg/actuation aerosol inhaler  inhale 1 puff by mouth every 6 hours if needed   Yes [provider]  albuterol (PROVENTIL) (5 MG/ML) 0.5% nebulizer solution Take 2.5 mg by nebulization every 6 (six) hours as needed for wheezing or shortness of breath.    Yes [provider]  ALPRAZolam Duanne Moron) 1 MG tablet Take 1 tablet (1 mg total) by mouth at bedtime as needed. 07/17/17  Yes Mody, Ulice Bold, MD  amitriptyline (ELAVIL) 50 MG tablet Take 50 mg by mouth at bedtime.   Yes [provider]  amLODipine (NORVASC) 10 MG tablet Take 10 mg by mouth daily.   Yes [provider]  busPIRone (BUSPAR) 10 MG tablet Take 10 mg by mouth daily.  06/17/14  Yes [provider]  citalopram (CELEXA) 20 MG tablet 1 tablet by mouth daily   Yes [provider]  diphenhydrAMINE (BENADRYL) 25 mg capsule Take 1 capsule (25 mg total) by mouth every 8 (eight) hours as needed for itching. 10/25/15  Yes  [place] Basta, MD  Fluticasone-Salmeterol (ADVAIR) 250-50 MCG/DOSE AEPB Inhale 1 puff into the lungs 2 (two) times daily.    Yes [provider]  furosemide (LASIX) 20 MG tablet Take 20 mg by mouth every morning.    Yes [provider]  gabapentin (NEURONTIN) 300 MG capsule Take 600 mg by mouth 4 (four) times daily.  12/11/15  Yes [provider]  hydrOXYzine (ATARAX/VISTARIL) 25 MG tablet Take 1 tablet by mouth  every 6 hours as needed 03/08/16  Yes [provider]  Ipratropium-Albuterol (COMBIVENT) 20-100 MCG/ACT AERS respimat Inhale 2 puffs into the lungs every 6 (six) hours as needed for wheezing or shortness of breath.    Yes [provider]  levETIRAcetam (KEPPRA)  500 MG tablet Take 500 mg by mouth 2 (two) times daily.  10/18/15  Yes [provider]  lipase/protease/amylase (CREON) 12000 units CPEP capsule Take 24,000 Units by mouth 3 (three) times daily with meals.   Yes [provider]  omeprazole (PRILOSEC) 20 MG capsule Take 20 mg by mouth every morning.  11/29/15  Yes [provider]  Oxycodone HCl 20 MG TABS Take 20 mg by mouth 3 (three) times daily as needed (pain).   Yes [provider]  phenytoin (DILANTIN) 100 MG/4ML suspension Take 300 mg by mouth 2 (two) times daily. Takes 12m by mouth bid   Yes [provider]  potassium chloride (K-DUR) 10 MEQ tablet Take 10 mEq by mouth daily.  05/23/16  Yes [provider]  pravastatin (PRAVACHOL) 10 MG tablet Take 10 mg by mouth daily.   Yes [provider]  promethazine (PHENERGAN) 25 MG tablet Take 25 mg by mouth every 8 (eight) hours as needed for nausea or vomiting.   Yes [provider]  spironolactone (ALDACTONE) 50 MG tablet Take 1 tablet (50 mg total) by mouth 2 (two) times daily. 10/25/15  Yes [place]an Basta MD  tiotropium (SPIRIVA) 18 MCG inhalation capsule Place 18 mcg into inhaler and inhale daily as needed (shortness of breath).    Yes [provider]  traMADol (ULTRAM) 50 MG tablet Take 1 tablet (50 mg total) by mouth every 6 (six) hours as needed for moderate pain. 06/02/16  Yes SSable Feil PA-C  zolpidem (AMBIEN) 10 MG tablet zolpidem 10 mg tablet by mouth at bedtime 02/13/16  Yes [provider]  acetaminophen-codeine (TYLENOL #3) 300-30 MG tablet acetaminophen 300 mg-codeine 30 mg tablet    [provider]  AFLURIA QUADRIVALENT 0.5 ML injection inject 0.5 milliliter intramuscularly 04/25/17   [provider]  albuterol (PROVENTIL HFA;VENTOLIN HFA) 108 (90 Base) MCG/ACT inhaler Inhale 2 puffs into the lungs every 6 (six) hours as needed for wheezing or shortness of breath.     [provider]  azithromycin (ZITHROMAX) 500 MG tablet azithromycin 500 mg tablet    [provider]  Blood Glucose Monitoring Suppl (FIFTY50 GLUCOSE METER 2.0) w/Device KIT ICD-10 E11.9 Check blood sugars once daily 09/04/15   [provider]  cefUROXime (CEFTIN) 500 MG tablet Take 1 tablet (500 mg total) by mouth 2 (two) times daily with a meal. Patient not taking: Reported on 08/14/2017 07/17/17   MBettey Costa MD  chlorhexidine (PERIDEX) 0.12 % solution chlorhexidine gluconate 0.12 % mouthwash    [provider]  ciprofloxacin (CIPRO) 500 MG tablet ciprofloxacin 500 mg tablet    [provider]  clindamycin (CLEOCIN) 300 MG capsule clindamycin HCl 300 mg capsule    [provider]  clotrimazole-betamethasone (LOTRISONE) cream clotrimazole-betamethasone 1 %-0.05 % topical cream    [provider]  Desvenlafaxine ER (PRISTIQ) 50 MG TB24 desvenlafaxine ER 50 mg tablet,extended release 24 hr    [provider]  diazepam (VALIUM) 10 MG tablet 1 tab po 30 minutes prior to MRI.  Patient should not drive after taking this medication. 04/17/16   [provider]  ENSURE (ENSURE) Take by mouth. 11/10/15   [provider]  escitalopram (LEXAPRO) 10 MG tablet escitalopram 10 mg tablet 01/04/13   [provider]  fluconazole (DIFLUCAN) 150 MG tablet fluconazole 150 mg tablet    [provider]  Fluticasone-Salmeterol (ADVAIR DISKUS) 250-50 MCG/DOSE AEPB Advair Diskus 250 mcg-50 mcg/dose powder for inhalation    [provider]  hydrOXYzine (VISTARIL) 50 MG capsule hydroxyzine pamoate 50 mg capsule    [provider]  Influenza vac split quadrivalent PF (FLUARIX) 0.5 ML injection Fluarix Quad 2015-2016 (PF) 60 mcg (15 mcg x 4)/0.5 mL IM syringe  inject 0.5 milliliter intramuscularly    [provider]  lactulose (CHRONULAC) 10 GM/15ML solution lactulose 10 gram/15 mL oral solution  09/07/12   [provider]  levofloxacin (LEVAQUIN) 500 MG tablet levofloxacin 500 mg tablet    [provider]  meloxicam (MOBIC) 7.5 MG tablet Take by mouth. 06/16/14   [provider]  metFORMIN (GLUCOPHAGE) 1000 MG tablet metformin 1,000 mg tablet 05/03/16   [provider]  metoCLOPramide (REGLAN) 10 MG tablet metoclopramide 10 mg tablet    [provider]  metoprolol succinate (TOPROL-XL) 25 MG 24 hr tablet metoprolol succinate ER 25 mg tablet,extended release 24 hr    [provider]  [place] Lancet    [provider]  mometasone-formoterol (DULERA) 200-5 MCG/ACT AERO Inhale 2 puffs into the lungs 2 (two) times daily.    [provider]  nicotine (NICODERM CQ - DOSED IN MG/24 HR) 7 mg/24hr patch Place onto the skin. 07/30/15   [provider]  nitrofurantoin (MACRODANTIN) 100 MG capsule nitrofurantoin monohydrate/macrocrystals 100 mg capsule 11/27/12   [provider]  ondansetron (ZOFRAN) 4 MG tablet ondansetron HCl 4 mg tablet    [provider]  ONETOUCH DELICA LANCETS 74J MISC USE TO CHECK BLOOD SUGARS  ONCE DAILY 04/25/16   [provider]  oxycodone (OXY-IR) 5 MG capsule oxycodone 5 mg tablet    [provider]  oxyCODONE-acetaminophen (PERCOCET) 10-325 MG tablet oxycodone-acetaminophen 10 mg-325 mg tablet    [provider]  phentermine (ADIPEX-P) 37.5 MG tablet phentermine 37.5 mg tablet  take 1 tablet by mouth once daily    [provider]  pneumococcal 23 valent vaccine (PNEUMOVAX 23) 25 MCG/0.5ML injection Pneumovax 23 25 mcg/0.5 mL injection syringe  inject 0.5 milliliter intramuscularly    [provider]  Polyethylene Glycol 3350 GRAN polyethylene glycol 3350 17 gram/dose oral powder    [provider]  potassium chloride (K-DUR,KLOR-CON) 10 MEQ tablet Take by mouth. 10/11/16 10/11/17  [provider]    predniSONE (DELTASONE) 10 MG tablet prednisone 10 mg tablet    [provider]  prochlorperazine (COMPAZINE) 10 MG tablet Take 10 mg by mouth every 12 (twelve) hours as needed for nausea or vomiting.    [provider]  QUEtiapine (SEROQUEL) 100 MG tablet quetiapine 100 mg tablet 01/06/13   [provider]  saxagliptin HCl (ONGLYZA) 5 MG TABS tablet Onglyza 5 mg tablet    [provider]  sertraline (ZOLOFT) 100 MG  tablet Take 200 mg by mouth daily.    [provider]  [facility] injection inject 0.5 milliliter intramuscularly 04/25/17   [provider]  solifenacin (VESICARE) 10 MG tablet Vesicare 10 mg tablet    [provider]  sucralfate (CARAFATE) 1 g tablet Take 1 g by mouth 4 (four) times daily. 06/02/17   [provider]  temazepam (RESTORIL) 30 MG capsule temazepam 30 mg capsule    [provider]  terconazole (TERAZOL 3) 80 MG vaginal suppository terconazole 80 mg vaginal suppository    [provider]  terconazole (TERAZOL 7) 0.4 % vaginal cream insert vaginally 1 applicatorful once daily at bedtime for 7 days 03/20/16   [provider]  triamcinolone (NASACORT) 55 MCG/ACT AERO nasal inhaler Place into the nose.    [provider]  triamcinolone cream (KENALOG) 0.1 % apply to affected area twice a day 02/07/16   [provider]  Venlafaxine HCl 150 MG TB24 venlafaxine ER 150 mg tablet,extended release 24 hr    [provider]  venlafaxine XR (EFFEXOR-XR) 75 MG 24 hr capsule venlafaxine ER 75 mg capsule,extended release 24 hr    [provider]  zolpidem (AMBIEN) 5 MG tablet zolpidem 5 mg tablet 02/28/16   [provider]    Family History  Problem Relation Age of Onset  . Hypertension Mother   . Heart disease Father   . Diabetes Father   . Cancer Sister        brain  . Cancer Grandchild 8       brain tumor     Social History   Tobacco Use   . Smoking status: Current Every Day Smoker    Packs/day: 1.00    Types: Cigarettes  . Smokeless tobacco: Never Used  Substance Use Topics  . Alcohol use: Yes    Alcohol/week: 2.4 oz    Types: 4 Cans of beer per week  . Drug use: Yes    Types: Cocaine    Allergies as of 08/14/2017 - Review Complete 08/14/2017  Allergen Reaction Noted  . Ibuprofen Other (See Comments) 11/04/2014  . Sulfa antibiotics Hives 10/21/2012  . Hydroxyzine Other (See Comments) 01/20/2016  . Amoxicillin Itching, Rash, and Other (See Comments) 02/11/2014  . Chocolate Rash 11/03/2012  . Chocolate flavor Rash 02/18/2013  . Ciprofloxacin Itching and Rash 01/31/2015  . Dilaudid [hydromorphone hcl] Itching 10/21/2012  . Fentanyl Rash 11/04/2014  . Hydromorphone Itching 02/11/2014  . Hydromorphone hcl Itching 10/21/2012  . Metformin Nausea And Vomiting 06/16/2015  . Penicillins Itching, Rash, and Other (See Comments) 10/21/2012  . Strawberry extract Rash 11/03/2012    Review of Systems:    All systems reviewed and negative except where noted in HPI.   Physical Exam:  Vital signs in last 24 hours: Temp:  [98.3 F (36.8 C)-98.6 F (37 C)] 98.3 F (36.8 C) (02/07 1706) Pulse Rate:  [90-120] 90 (02/07 1706) Resp:  [13-29] 23 (02/07 0930) BP: (92-117)/(55-82) 92/61 (02/07 1706) SpO2:  [92 %-97 %] 97 % (02/07 1706) Weight:  [280 lb (127 kg)] 280 lb (127 kg) (02/07 0338)   General:   Pleasant, cooperative in NAD Head:  Normocephalic and atraumatic. Eyes:   No icterus.   Conjunctiva pink. PERRLA. Ears:  Normal auditory acuity. Neck:  Supple; no masses or thyroidomegaly Lungs: Respirations even and unlabored. Lungs clear to auscultation bilaterally.   No wheezes, crackles, or rhonchi.  Heart:  Regular rate and rhythm;  Without murmur, clicks, rubs or  gallops Abdomen:  Soft, nondistended, nontender. Normal bowel sounds. No appreciable masses or hepatomegaly.  No rebound or guarding.  Rectal:  Not  performed. Msk:  Symmetrical without gross deformities.  Strength normal Extremities:  Without edema, cyanosis or clubbing. Neurologic:  Alert and oriented x3;  grossly normal neurologically. Skin:  Intact without significant lesions or rashes. Cervical Nodes:  No significant cervical adenopathy. Psych:  Alert and cooperative. Normal affect.  LAB RESULTS: CBC Latest Ref Rng & Units 08/14/2017 08/14/2017 07/16/2017  WBC 3.6 - 11.0 K/uL - 5.7 -  Hemoglobin 12.0 - 16.0 g/dL 10.2(L) 11.5(L) 11.3(L)  Hematocrit 35.0 - 47.0 % 32.2(L) 35.5 36.1  Platelets 150 - 440 K/uL - 146(L) -    BMET BMP Latest Ref Rng & Units 08/14/2017 07/16/2017 07/15/2017  Glucose 65 - 99 mg/dL 136(H) 80 128(H)  BUN 6 - 20 mg/dL 7 5(L) 6  Creatinine 0.44 - 1.00 mg/dL 0.74 0.85 0.94  Sodium 135 - 145 mmol/L 142 139 139  Potassium 3.5 - 5.1 mmol/L 4.2 3.8 3.5  Chloride 101 - 111 mmol/L 111 111 108  CO2 22 - 32 mmol/L 19(L) 22 23  Calcium 8.9 - 10.3 mg/dL 9.3 8.3(L) 8.6(L)    LFT Hepatic Function Latest Ref Rng & Units 08/14/2017 07/15/2017 07/13/2017  Total Protein 6.5 - 8.1 g/dL 7.9 7.1 7.7  Albumin 3.5 - 5.0 g/dL 3.3(L) 2.9(L) 3.0(L)  AST 15 - 41 U/L 51(H) 32 37  ALT 14 - 54 U/L _0 Alk Phosphatase 38 - 126 U/L 122 89 98  Total Bilirubin 0.3 - 1.2 mg/dL 0.9 0.8 0.8     STUDIES: No results found.    Impression / Plan:   [name] is a 62 y.o. 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 She is now readmitted with another episode of rectal bleeding, hemodynamically insignificant bleed. Hemoglobin is at baseline. This is consistent with outlet bleeding.  Recommend flexible sigmoidoscopy tomorrow to evaluate the rectum and possible treatment  Clear liquid diet  MiraLAX prep Nothing by mouth past midnight Tapwater enema tomorrow Check  ferritin, iron, TIBC, B12  I have discussed alternative options, risks & benefits,  which include, but are not limited to, bleeding, infection, perforation,respiratory complication & drug reaction.  The patient agrees with this plan & written consent will be obtained.    Thank you for involving me in the care of this patient.      LOS: 0 days   Sherri Sear, MD  08/14/2017, 6:47 PM   Note: This dictation was prepared with Dragon dictation along with smaller phrase technology. Any transcriptional errors that result from this process are unintentional.

## 2017-08-14 NOTE — H&P (Signed)
[facility] [place] NAME: [name]    MR#:  [id number]  DATE OF BIRTH:  [birth year]  DATE OF ADMISSION:  08/14/2017  PRIMARY CARE PHYSICIAN: System, Pcp Not In   REQUESTING/REFERRING PHYSICIAN: Owens Shark  CHIEF COMPLAINT:   Chief Complaint  Patient presents with  . Rectal Bleeding    HISTORY OF PRESENT ILLNESS: [name]  is a 62 y.o. 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 She complains of some abdominal discomfort but denies any associated vomiting or nausea, fever or chills. Hemoglobin is slightly low but still in the safe range compared to her previous records. She is tachycardic and blood pressure is running on lower normal side so given to hospitalist team for further management.  PAST MEDICAL HISTORY:   Past Medical History:  Diagnosis Date  . Allergy   . Anemia   . Anxiety   . CHF (congestive heart failure) ([place])   . Chronic bronchitis ([place])   . Chronic kidney disease   . Cirrhosis ([place])   . Depression   . Diabetes mellitus without complication ([place])   . Diverticulitis   . Emphysema of lung ([place])   . GERD (gastroesophageal reflux disease)   . H/O drug abuse    Clean since 2009  . H/O ETOH abuse    Sober since 2009  . Hepatitis C   . History of sepsis 2016  . Hypertension   . Malignant brain tumor ([place]) 2007  . Migraines   . Pancreatic ascites   . Pancreatitis, alcoholic 1314  . Rectovaginal fistula   . Seizures [facility])    Last seizure October 2017  . Spleen enlarged   . Stroke [facility]) 2007, 2008   during brain surgery  . Urine incontinence     PAST SURGICAL HISTORY:  Past Surgical History:  Procedure Laterality Date  . ABDOMINAL  HYSTERECTOMY  1979  . APPENDECTOMY  1999  . BRAIN SURGERY  2007   malignant brain tumor  . CHOLECYSTECTOMY  2001  . COLON SURGERY  2006  . COLONOSCOPY N/A 07/17/2017   Procedure: COLONOSCOPY;  Surgeon: Lin Landsman, MD;  [facility] END[facility]COPY;  Service: Gastroenterology;  Laterality: N/A;  . CRANIOPLASTY N/A 09/15/2015   Procedure: Cranioplasty with retrieval of bone flap from abdominal pocket;  Surgeon: Ashok Pall, MD;  Location: [place] [facility] NEURO ORS;  Service: Neurosurgery;  Laterality: N/A;  Cranioplasty with retrieval of bone flap from abdominal pocket  . CRANIOTOMY Right 08/16/2015   Procedure: Right Fronto-Temporal-Parietal Craniotomy for Evacuation of Hematoma with Bone Flap Placement in Abdomen;  Surgeon: Ashok Pall, MD;  Location: [place] NEURO ORS;  Service: Neurosurgery;  Laterality: Right;  . CRANIOTOMY N/A 12/28/2015   Procedure: Craniectomy for wound debridement;  Surgeon: Ashok Pall, MD;  Location: [place] NEURO ORS;  Service: Neurosurgery;  Laterality: N/A;  Craniectomy for wound debridement  . DILATION AND CURETTAGE OF UTERUS    . ESOPHAGOGASTRODUODENOSCOPY N/A 07/17/2017   Procedure: ESOPHAGOGASTRODUODENOSCOPY (EGD);  Surgeon: Lin Landsman, MD;  Location: [facility] ENDOSCOPY;  Service: Gastroenterology;  Laterality: N/A;  . EYE SURGERY    . JOINT REPLACEMENT Left    TOTAL KNEE REPLACEMENT  . rectovaginal fistula repair w/ colostomy  2011  . REPLACEMENT TOTAL KNEE  Left 2005    SOCIAL HISTORY:  Social History   Tobacco Use  . Smoking status: Current Every Day Smoker    Packs/day: 1.00    Types: Cigarettes  . Smokeless tobacco: Never Used  Substance Use Topics  . Alcohol use: Yes    Alcohol/week: 2.4 oz    Types: 4 Cans of beer per week    FAMILY HISTORY:  Family History  Problem Relation Age of Onset  . Hypertension Mother   . Heart disease Father   . Diabetes Father   . Cancer Sister        brain  . Cancer Grandchild 8       brain tumor    DRUG ALLERGIES:   Allergies  Allergen Reactions  . Ibuprofen Other (See Comments)    Pt states that she is unable to take this medication because of her liver.    . Sulfa Antibiotics Hives  . Hydroxyzine Other (See Comments)    Causes crying   . Amoxicillin Itching, Rash and Other (See Comments)    Has patient had a PCN reaction causing immediate rash, facial/tongue/throat swelling, SOB or lightheadedness with hypotension: No Has patient had a PCN reaction causing severe rash involving mucus membranes or skin necrosis: No Has patient had a PCN reaction that required hospitalization No Has patient had a PCN reaction occurring within the last 10 years: Yes If all of the above answers are "NO", then may proceed with Cephalosporin use.  . Chocolate Rash  . Chocolate Flavor Rash  . Ciprofloxacin Itching and Rash  . Dilaudid [Hydromorphone Hcl] Itching  . Fentanyl Rash  . Hydromorphone Itching  . Hydromorphone Hcl Itching  . Metformin Nausea And Vomiting  . Penicillins Itching, Rash and Other (See Comments)    Has patient had a PCN reaction causing immediate rash, facial/tongue/throat swelling, SOB or lightheadedness with hypotension: No Has patient had a PCN reaction causing severe rash involving mucus membranes or skin necrosis: No Has patient had a PCN reaction that required hospitalization No Has patient had a PCN reaction occurring within the last 10 years: Yes If all of the above answers are "NO", then may proceed with Cephalosporin use.  . Strawberry Extract Rash    REVIEW OF SYSTEMS:   CONSTITUTIONAL: No fever, fatigue or weakness.  EYES: No blurred or double vision.  EARS, NOSE, AND THROAT: No tinnitus or ear pain.  RESPIRATORY: No cough, shortness of breath, wheezing or hemoptysis.  CARDIOVASCULAR: No chest pain, orthopnea, edema.  GASTROINTESTINAL: No nausea, vomiting, diarrhea or abdominal pain.  GENITOURINARY: No dysuria, hematuria.  ENDOCRINE: No polyuria, nocturia,  HEMATOLOGY: No  anemia, easy bruising or bleeding SKIN: No rash or lesion. MUSCULOSKELETAL: No joint pain or arthritis.   NEUROLOGIC: No tingling, numbness, weakness.  PSYCHIATRY: No anxiety or depression.   MEDICATIONS AT HOME:  Prior to Admission medications   Medication Sig Start Date End Date Taking? Authorizing Provider  albuterol (PROAIR HFA) 108 (90 Base) MCG/ACT inhaler ProAir HFA 90 mcg/actuation aerosol inhaler  inhale 1 puff by mouth every 6 hours if needed   Yes [provider]  albuterol (PROVENTIL) (5 MG/ML) 0.5% nebulizer solution Take 2.5 mg by nebulization every 6 (six) hours as needed for wheezing or shortness of breath.    Yes [provider]  ALPRAZolam Duanne Moron) 1 MG tablet Take 1 tablet (1 mg total) by mouth at bedtime as needed. 07/17/17  Yes Mody, Ulice Bold, MD  amitriptyline (ELAVIL) 50 MG tablet Take 50 mg by mouth  at bedtime.   Yes [provider]  amLODipine (NORVASC) 10 MG tablet Take 10 mg by mouth daily.   Yes [provider]  busPIRone (BUSPAR) 10 MG tablet Take 10 mg by mouth daily.  06/17/14  Yes [provider]  citalopram (CELEXA) 20 MG tablet 1 tablet by mouth daily   Yes [provider]  diphenhydrAMINE (BENADRYL) 25 mg capsule Take 1 capsule (25 mg total) by mouth every 8 (eight) hours as needed for itching. 10/25/15  Yes Vaughan Basta, MD  Fluticasone-Salmeterol (ADVAIR) 250-50 MCG/DOSE AEPB Inhale 1 puff into the lungs 2 (two) times daily.    Yes [provider]  furosemide (LASIX) 20 MG tablet Take 20 mg by mouth every morning.    Yes [provider]  gabapentin (NEURONTIN) 300 MG capsule Take 600 mg by mouth 4 (four) times daily.  12/11/15  Yes [provider]  hydrOXYzine (ATARAX/VISTARIL) 25 MG tablet Take 1 tablet by mouth  every 6 hours as needed 03/08/16  Yes [provider]  Ipratropium-Albuterol (COMBIVENT) 20-100 MCG/ACT AERS respimat Inhale 2 puffs into the lungs every 6  (six) hours as needed for wheezing or shortness of breath.    Yes [provider]  levETIRAcetam (KEPPRA) 500 MG tablet Take 500 mg by mouth 2 (two) times daily.  10/18/15  Yes [provider]  lipase/protease/amylase (CREON) 12000 units CPEP capsule Take 24,000 Units by mouth 3 (three) times daily with meals.   Yes [provider]  omeprazole (PRILOSEC) 20 MG capsule Take 20 mg by mouth every morning.  11/29/15  Yes [provider]  Oxycodone HCl 20 MG TABS Take 20 mg by mouth 3 (three) times daily as needed (pain).   Yes [provider]  phenytoin (DILANTIN) 100 MG/4ML suspension Take 300 mg by mouth 2 (two) times daily. Takes 83m by mouth bid   Yes [provider]  potassium chloride (K-DUR) 10 MEQ tablet Take 10 mEq by mouth daily.  05/23/16  Yes [provider]  pravastatin (PRAVACHOL) 10 MG tablet Take 10 mg by mouth daily.   Yes [provider]  promethazine (PHENERGAN) 25 MG tablet Take 25 mg by mouth every 8 (eight) hours as needed for nausea or vomiting.   Yes [provider]  spironolactone (ALDACTONE) 50 MG tablet Take 1 tablet (50 mg total) by mouth 2 (two) times daily. 10/25/15  Yes VVaughan Basta MD  tiotropium (SPIRIVA) 18 MCG inhalation capsule Place 18 mcg into inhaler and inhale daily as needed (shortness of breath).    Yes [provider]  traMADol (ULTRAM) 50 MG tablet Take 1 tablet (50 mg total) by mouth every 6 (six) hours as needed for moderate pain. 06/02/16  Yes SSable Feil PA-C  zolpidem (AMBIEN) 10 MG tablet zolpidem 10 mg tablet by mouth at bedtime 02/13/16  Yes [provider]  acetaminophen-codeine (TYLENOL #3) 300-30 MG tablet acetaminophen 300 mg-codeine 30 mg tablet    [provider]  AFLURIA QUADRIVALENT 0.5 ML injection inject 0.5 milliliter intramuscularly 04/25/17   [provider]  albuterol (PROVENTIL HFA;VENTOLIN HFA) 108 (90 Base)  MCG/ACT inhaler Inhale 2 puffs into the lungs every 6 (six) hours as needed for wheezing or shortness of breath.    [provider]  azithromycin (ZITHROMAX) 500 MG tablet azithromycin 500 mg tablet    [provider]  Blood Glucose Monitoring Suppl (FIFTY50 GLUCOSE METER 2.0) w/Device KIT ICD-10 E11.9 Check blood sugars once daily 09/04/15  [provider]  cefUROXime (CEFTIN) 500 MG tablet Take 1 tablet (500 mg total) by mouth 2 (two) times daily with a meal. Patient not taking: Reported on 08/14/2017 07/17/17   Bettey Costa, MD  chlorhexidine (PERIDEX) 0.12 % solution chlorhexidine gluconate 0.12 % mouthwash    [provider]  ciprofloxacin (CIPRO) 500 MG tablet ciprofloxacin 500 mg tablet    [provider]  clindamycin (CLEOCIN) 300 MG capsule clindamycin HCl 300 mg capsule    [provider]  clotrimazole-betamethasone (LOTRISONE) cream clotrimazole-betamethasone 1 %-0.05 % topical cream    [provider]  Desvenlafaxine ER (PRISTIQ) 50 MG TB24 desvenlafaxine ER 50 mg tablet,extended release 24 hr    [provider]  diazepam (VALIUM) 10 MG tablet 1 tab po 30 minutes prior to MRI.  Patient should not drive after taking this medication. 04/17/16   [provider]  ENSURE (ENSURE) Take by mouth. 11/10/15   [provider]  escitalopram (LEXAPRO) 10 MG tablet escitalopram 10 mg tablet 01/04/13   [provider]  fluconazole (DIFLUCAN) 150 MG tablet fluconazole 150 mg tablet    [provider]  Fluticasone-Salmeterol (ADVAIR DISKUS) 250-50 MCG/DOSE AEPB Advair Diskus 250 mcg-50 mcg/dose powder for inhalation    [provider]  hydrOXYzine (VISTARIL) 50 MG capsule hydroxyzine pamoate 50 mg capsule    [provider]  Influenza vac split quadrivalent PF (FLUARIX) 0.5 ML injection Fluarix Quad 2015-2016 (PF) 60 mcg (15 mcg x 4)/0.5 mL IM syringe  inject 0.5 milliliter  intramuscularly    [provider]  lactulose (CHRONULAC) 10 GM/15ML solution lactulose 10 gram/15 mL oral solution 09/07/12   [provider]  levofloxacin (LEVAQUIN) 500 MG tablet levofloxacin 500 mg tablet    [provider]  meloxicam (MOBIC) 7.5 MG tablet Take by mouth. 06/16/14   [provider]  metFORMIN (GLUCOPHAGE) 1000 MG tablet metformin 1,000 mg tablet 05/03/16   [provider]  metoCLOPramide (REGLAN) 10 MG tablet metoclopramide 10 mg tablet    [provider]  metoprolol succinate (TOPROL-XL) 25 MG 24 hr tablet metoprolol succinate ER 25 mg tablet,extended release 24 hr    [provider]  [place] Lancet    [provider]  mometasone-formoterol (DULERA) 200-5 MCG/ACT AERO Inhale 2 puffs into the lungs 2 (two) times daily.    [provider]  nicotine (NICODERM CQ - DOSED IN MG/24 HR) 7 mg/24hr patch Place onto the skin. 07/30/15   [provider]  nitrofurantoin (MACRODANTIN) 100 MG capsule nitrofurantoin monohydrate/macrocrystals 100 mg capsule 11/27/12   [provider]  ondansetron (ZOFRAN) 4 MG tablet ondansetron HCl 4 mg tablet    [provider]  ONETOUCH DELICA LANCETS 09U MISC USE TO CHECK BLOOD SUGARS  ONCE DAILY 04/25/16   [provider]  oxycodone (OXY-IR) 5 MG capsule oxycodone 5 mg tablet    [provider]  oxyCODONE-acetaminophen (PERCOCET) 10-325 MG tablet oxycodone-acetaminophen 10 mg-325 mg tablet    [provider]  phentermine (ADIPEX-P) 37.5 MG tablet phentermine 37.5 mg tablet  take 1 tablet by mouth once daily    [provider]  pneumococcal 23 valent vaccine (PNEUMOVAX 23) 25 MCG/0.5ML injection Pneumovax 23 25 mcg/0.5 mL injection syringe  inject 0.5 milliliter intramuscularly    [provider]  Polyethylene Glycol 3350 GRAN polyethylene glycol 3350 17 gram/dose oral powder     [provider]  potassium chloride (K-DUR,KLOR-CON) 10 MEQ tablet Take by mouth. 10/11/16 10/11/17  [provider]  predniSONE (DELTASONE) 10 MG tablet prednisone 10 mg tablet    [provider]  prochlorperazine (COMPAZINE) 10 MG tablet Take 10 mg by mouth every 12 (twelve) hours as needed for nausea or vomiting.    [provider]  QUEtiapine (SEROQUEL) 100 MG tablet quetiapine 100 mg tablet 01/06/13   [provider]  saxagliptin HCl (ONGLYZA) 5 MG TABS tablet Onglyza 5 mg tablet    [provider]  sertraline (ZOLOFT) 100 MG tablet Take 200 mg by mouth daily.    [provider]  [facility] injection inject 0.5 milliliter intramuscularly 04/25/17   [provider]  solifenacin (VESICARE) 10 MG tablet Vesicare 10 mg tablet    [provider]  sucralfate (CARAFATE) 1 g tablet Take 1 g by mouth 4 (four) times daily. 06/02/17   [provider]  temazepam (RESTORIL) 30 MG capsule temazepam 30 mg capsule    [provider]  terconazole (TERAZOL 3) 80 MG vaginal suppository terconazole 80 mg vaginal suppository    [provider]  terconazole (TERAZOL 7) 0.4 % vaginal cream insert vaginally 1 applicatorful once daily at bedtime for 7 days 03/20/16   [provider]  triamcinolone (NASACORT) 55 MCG/ACT AERO nasal inhaler Place into the nose.    [provider]  triamcinolone cream (KENALOG) 0.1 % apply to affected area twice a day 02/07/16   [provider]  Venlafaxine HCl 150 MG TB24 venlafaxine ER 150 mg tablet,extended release 24 hr    [provider]  venlafaxine XR (EFFEXOR-XR) 75 MG 24 hr capsule venlafaxine ER 75 mg capsule,extended release 24 hr    [provider]  zolpidem (AMBIEN) 5 MG tablet zolpidem 5 mg tablet 02/28/16   [provider]      PHYSICAL EXAMINATION:   VITAL SIGNS: Blood pressure 101/70, pulse (!) 102, temperature 98.6 F  (37 C), temperature source Oral, resp. rate (!) 25, height 5' 8" (1.727 m), weight 127 kg (280 lb), SpO2 93 %.  GENERAL:  62 y.o.-year-old morbidly obese patient lying in the bed with no acute distress.  EYES: Pupils equal, round, reactive to light and accommodation. No scleral icterus. Extraocular muscles intact.  HEENT: Head atraumatic, normocephalic. Oropharynx and nasopharynx clear.  NECK:  Supple, no jugular venous distention. No thyroid enlargement, no tenderness.  LUNGS: Normal breath sounds bilaterally, no wheezing, rales,rhonchi or crepitation. No use of accessory muscles of respiration.  CARDIOVASCULAR: S1, S2 normal. No murmurs, rubs, or gallops.  ABDOMEN: Soft, nontender, nondistended. Bowel sounds present. No organomegaly or mass.  EXTREMITIES: No pedal edema, cyanosis, or clubbing.  NEUROLOGIC: Cranial nerves II through XII are intact. Muscle strength 5/5 in all extremities. Sensation intact. Gait not checked.  PSYCHIATRIC: The patient is alert and oriented x 3.  SKIN: No obvious rash, lesion, or ulcer.   LABORATORY PANEL:   CBC Recent Labs  Lab 08/14/17 0324 08/14/17 0638  WBC 5.7  --   HGB 11.5* 10.2*  HCT 35.5 32.2*  PLT 146*  --   MCV 85.2  --   MCH 27.6  --   MCHC 32.4  --   RDW 16.9*  --   LYMPHSABS 1.5  --   MONOABS 0.5  --   EOSABS 0.2  --   BASOSABS 0.0  --    ------------------------------------------------------------------------------------------------------------------  Chemistries  Recent Labs  Lab 08/14/17 0324  NA 142  K 4.2  CL 111  CO2 19*  GLUCOSE 136*  BUN 7  CREATININE 0.74  CALCIUM 9.3  AST 51*  ALT 20  ALKPHOS 122  BILITOT 0.9   ------------------------------------------------------------------------------------------------------------------ estimated creatinine clearance is 105.2 mL/min (by C-G formula based on SCr of 0.74  mg/dL). ------------------------------------------------------------------------------------------------------------------ No results for input(s): TSH, T4TOTAL, T3FREE, THYROIDAB in the last 72 hours.  Invalid input(s): FREET3   Coagulation profile Recent Labs  Lab 08/14/17 0348  INR 1.24   ------------------------------------------------------------------------------------------------------------------- No results for input(s): DDIMER in the last 72 hours. -------------------------------------------------------------------------------------------------------------------  Cardiac Enzymes No results for input(s): CKMB, TROPONINI, MYOGLOBIN in the last 168 hours.  Invalid input(s): CK ------------------------------------------------------------------------------------------------------------------ Invalid input(s): POCBNP  ---------------------------------------------------------------------------------------------------------------  Urinalysis    Component Value Date/Time   COLORURINE AMBER (A) 07/13/2017 2229   APPEARANCEUR CLOUDY (A) 07/13/2017 2229   APPEARANCEUR Hazy 10/16/2014 0605   LABSPEC 1.005 07/13/2017 2229   LABSPEC 1.006 10/16/2014 0605   PHURINE 6.0 07/13/2017 2229   GLUCOSEU NEGATIVE 07/13/2017 2229   GLUCOSEU Negative 10/16/2014 0605   HGBUR LARGE (A) 07/13/2017 2229   BILIRUBINUR NEGATIVE 07/13/2017 2229   BILIRUBINUR NEGATIVE 11/15/2015 1417   BILIRUBINUR Negative 10/16/2014 0605   KETONESUR NEGATIVE 07/13/2017 2229   PROTEINUR NEGATIVE 07/13/2017 2229   UROBILINOGEN negative 11/15/2015 1417   UROBILINOGEN 2.0 (H) 06/07/2014 1916   NITRITE POSITIVE (A) 07/13/2017 2229   LEUKOCYTESUR MODERATE (A) 07/13/2017 2229   LEUKOCYTESUR Negative 10/16/2014 0605     RADIOLOGY: No results found.  EKG: Orders placed or performed during the hospital encounter of 08/14/17  . EKG 12-Lead  . EKG 12-Lead    IMPRESSION AND PLAN:  * GI bleed   She has  history of alcoholic liver cirrhosis and esophageal verises.   Likely this may be due to that.   IV octreotide and pantoprazole.   IV fluids and keep nothing by mouth.   Follow hemoglobin later today again.   GI consult for further management   Currently hemoglobin is stable and so does not require transfusion.  * Tachycardia   This may be secondary to GI bleed, blood pressure is running borderline low was so I will hold antihypertensive medication give some IV fluid.  * Diabetes   As I'm keeping nothing by mouth, will give insulin sliding scale coverage but will hold oral antidiabetic medication.  * Hypertension   Hold oral medication as blood pressure running soft.  * Chronic diastolic congestive heart failure   Hold medications for now due to soft blood pressure.  * Depression   On multiple antidepressant medications at home, will continue some.  * Chronic pancreatitis   She takes pancreatic lipase with meals, currently we'll hold because of keeping nothing by mouth.  * Chronic alcoholism   Counseled to quit drinking due to her recurrent lever and GI bleeding issues.  * Seizure disorder   Continue home medications for now.  * History of stroke   Not a candidate for antiplatelet because of recurrent GI bleeding and verices.  * Active smoking  Counseled to quit smoking for 4 minutes and offered nicotine patch.   All the records are reviewed and case discussed with ED provider. Management plans discussed with the patient, family and they are in agreement.  CODE STATUS:Full code.  Code Status History    Date Active Date Inactive Code Status Order ID Comments User Context   07/16/2017 01:40 07/17/2017 17:42 Full Code [id number]  Saundra Shelling, MD ED   07/14/2017 05:03 07/15/2017 15:16 Full Code [id number]  Saundra Shelling, MD Inpatient   02/24/2016 20:03 02/26/2016 17:20 Full  Code [id number]  Idelle Crouch, MD Inpatient   12/26/2015 13:00 01/05/2016 21:42 Full Code [id number]   Ashok Pall, MD Inpatient   10/22/2015 04:26 10/25/2015 18:52 Full Code [id number]  Saundra Shelling, MD ED   08/16/2015 21:36 08/30/2015 18:11 Full Code [id number]  Ashok Pall, MD Inpatient   06/22/2015 01:45 06/22/2015 18:09 Full Code [id number]  Lytle Butte, MD ED   05/16/2015 12:00 05/16/2015 18:17 DNR [id number]  Max Sane, MD Inpatient   05/15/2015 01:10 05/16/2015 12:00 Full Code [id number]  Harrie Foreman, MD Inpatient   11/05/2014 12:35 11/05/2014 15:55 Full Code [id number]  Penne Lash, RN Inpatient   11/05/2014 12:17 11/05/2014 12:35 Full Code [id number]  Penne Lash, RN Inpatient       TOTAL TIME TAKING CARE OF THIS PATIENT: 50 minutes.    Vaughan Basta M.D on 08/14/2017   Between 7am to 6pm - Pager - [phone]  After 6pm go to [email or web address] - password [email or web address] [place] [facility]  Office  [phone]  CC: Primary care physician; System, Pcp Not In   Note: This dictation was prepared with Dragon dictation along with smaller phrase technology. Any transcriptional errors that result from this process are unintentional.

## 2017-08-14 NOTE — Care Management (Signed)
Patient admitted for GI bleed. Patient lives at home with husband and 4 grandchildren.  PCP [facility].   Denies any issues obtaining medication.  Husbands provides transportation.  Patient has Medicaid PCS services 6 hours a day/ 7 days a week. Patient was previously set up with home health services through [facility], then was later not accepted due to non compliance.

## 2017-08-14 NOTE — Care Management Obs Status (Signed)
[place] NOTIFICATION   Patient Details  Name: [name] MRN: [id number] Date of Birth: 14-Mar-1956   Medicare Observation Status Notification Given:  Yes    Beverly Sessions, RN 08/14/2017, 2:00 PM

## 2017-08-14 NOTE — ED Notes (Signed)
Nurse unable to take report at this time.

## 2017-08-14 NOTE — ED Notes (Signed)
Pt up to bedside commode with one assist, steady gait noted

## 2017-08-14 NOTE — Consult Note (Signed)
   Rohini R Vanga, MD [address]  [address]  [place], [place] [address]  Main: [phone]  Fax: [phone] Pager: [phone]   Consultation  Referring Provider:     No ref. provider found Primary Care Physician:  System, Pcp Not In Primary Gastroenterologist:  Dr. Rohini Vanga         Reason for Consultation:     Rectal bleeding  Date of Admission:  08/14/2017 Date of Consultation:  08/14/2017         HPI:   [name] is a 61 y.o. female with multiple comorbidities as listed below, significant for cirrhosis, well compensated, secondary to hepatitis C status post SVR after treatment with harvoni, CHF who had recurrent admissions within last 1 month secondary to several episodes of rectal bleeding. 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 She presented today with episodes of passing blood clots per rectum. Her hemoglobin was 11.5 early this morning, repeat came down to 10.2 which is actually her baseline. She reports that she had another episode on the floor. She is kept nothing by mouth. She denies abdominal pain, nausea, vomiting,  hematemesis, melena or constipation or diarrhea.  She has prior history of rectovaginal fistula of unclear etiology, underwent temporary colostomy followed by colorectal anastomosis. 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 She denies any upper or lower GI symptoms otherwise.   NSAIDs: None  Antiplts/Anticoagulants/Anti thrombotics: none   GI Procedures:   EGD 07/17/2017 - Normal first portion of the duodenum and second portion of the duodenum. - Portal hypertensive gastropathy. - Small (< 5 mm) esophageal varices. - Normal gastroesophageal junction.  Colonoscopy 07/17/2017 - Liquid brown stool in the entire colon - Preparation of the colon was  fair. - Congested mucosa in the entire examined colon c/w portal colopathy. - Rectal bleeding is secondary to portal colopathy involving rectum  EGD in 2014 which revealed normal esophagus and stomach, small duodenal polyp.  Diagnosis:  DUODENAL POLYP COLD BIOPSY:  - DUODENAL MUCOSA WITH BRUNNER GLAND HYPERPLASIA, ACTIVE  INFLAMMATION AND FEATURES SUGGESTIVE OF PEPTIC DUODENITIS.  - NEGATIVE FOR DYSPLASIA AND MALIGNANCY.   EGD in 11/2013  Diagnosis:  GASTRIC BIOPSY:  - OXYNTIC MUCOSA WITH MILD REACTIVE GASTROPATHY.  - NO ACTIVE INFLAMMATION, INTESTINAL METAPLASIA, OR ATROPHY.  - NO DYSPLASIA OR MALIGNANCY.  - NO HELICOBACTER PYLORI IDENTIFIED IN HEMATOXYLIN AND EOSIN  SECTIONS.   EGD in 04/2015 at [facility] - Grade I esophageal varices.  - Bilious gastric fluid.  - Portal hypertensive gastropathy.  - A single duodenal bulb polyp. Biopsied. 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  - NEGATIVE FOR HIGH GRADE DYSPLASIA AND MALIGNANCY.   Colonoscopy at [facility] in 03/2015 Impression:- The entire examined colon is normal.  - The ileocecal valve is normal.  - No specimens collected.  - Congested mucosa in the rectum. Thinning of the   rectovaginal septum on bimanual exam but no definite   rectovaginal fistula detected.  - The rectum is normal.   Past Medical History:  Diagnosis Date  . Allergy   . Anemia   . Anxiety   . CHF (congestive heart   failure) (HCC)   . Chronic bronchitis (HCC)   . Chronic kidney disease   . Cirrhosis (HCC)   . Depression   . Diabetes mellitus  without complication (HCC)   . Diverticulitis   . Emphysema of lung (HCC)   . GERD (gastroesophageal reflux disease)   . H/O drug abuse    Clean since 2009  . H/O ETOH abuse    Sober since 2009  . Hepatitis C   . History of sepsis 2016  . Hypertension   . Malignant brain tumor (HCC) 2007  . Migraines   . Pancreatic ascites   . Pancreatitis, alcoholic 2013  . Rectovaginal fistula   . Seizures (HCC)    Last seizure October 2017  . Spleen enlarged   . Stroke (HCC) 2007, 2008   during brain surgery  . Urine incontinence     Past Surgical History:  Procedure Laterality Date  . ABDOMINAL HYSTERECTOMY  1979  . APPENDECTOMY  1999  . BRAIN SURGERY  2007   malignant brain tumor  . CHOLECYSTECTOMY  2001  . COLON SURGERY  2006  . COLONOSCOPY N/A 07/17/2017   Procedure: COLONOSCOPY;  Surgeon: Vanga, Rohini Reddy, MD;  Location: [facility] ENDOSCOPY;  Service: Gastroenterology;  Laterality: N/A;  . CRANIOPLASTY N/A 09/15/2015   Procedure: Cranioplasty with retrieval of bone flap from abdominal pocket;  Surgeon: Kyle Cabbell, MD;  Location: [facility]UR[facility];  Service: Neurosurgery;  Laterality: N/A;  Cranioplasty with retrieval of bone flap from abdominal pocket  . CRANIOTOMY Right 08/16/2015   Procedure: Right Fronto-Temporal-Parietal Craniotomy for Evacuation of Hematoma with Bone Flap Placement in Abdomen;  Surgeon: Kyle Cabbell, MD;  Location: [facility];  Service: Neurosurgery;  Laterality: Right;  . CRANIOTOMY N/A 12/28/2015   Procedure: Craniectomy for wound debridement;  Surgeon: Kyle Cabbell, MD;  Location: [facility];  Service: Neurosurgery;  Laterality: N/A;  Craniectomy for wound debridement  . DILATION AND CURETTAGE OF UTERUS    . ESOPHAGOGASTRODUODENOSCOPY N/A 07/17/2017   Procedure: ESOPHAGOGASTRODUODENOSCOPY (EGD);  Surgeon: Vanga, Rohini Reddy, MD;  Location: [facility] ENDOSCOPY;  Service: Gastroenterology;  Laterality: N/A;  . EYE SURGERY    . JOINT REPLACEMENT Left    TOTAL KNEE  REPLACEMENT  . rectovaginal fistula repair w/ colostomy  2011  . REPLACEMENT TOTAL KNEE Left 2005    Prior to Admission medications   Medication Sig Start Date End Date Taking? Authorizing Provider  albuterol (PROAIR HFA) 108 (90 Base) MCG/ACT inhaler ProAir HFA 90 mcg/actuation aerosol inhaler  inhale 1 puff by mouth every 6 hours if needed   Yes [provider]  albuterol (PROVENTIL) (5 MG/ML) 0.5% nebulizer solution Take 2.5 mg by nebulization every 6 (six) hours as needed for wheezing or shortness of breath.    Yes [provider]  ALPRAZolam (XANAX) 1 MG tablet Take 1 tablet (1 mg total) by mouth at bedtime as needed. 07/17/17  Yes Mody, Sital, MD  amitriptyline (ELAVIL) 50 MG tablet Take 50 mg by mouth at bedtime.   Yes [provider]  amLODipine (NORVASC) 10 MG tablet Take 10 mg by mouth daily.   Yes [provider]  busPIRone (BUSPAR) 10 MG tablet Take 10 mg by mouth daily.  06/17/14  Yes [provider]  citalopram (CELEXA) 20 MG tablet 1 tablet by mouth daily   Yes [provider]  diphenhydrAMINE (BENADRYL) 25 mg capsule Take 1 capsule (25 mg total) by mouth every 8 (eight) hours as needed for itching. 10/25/15  Yes   Vachhani, Vaibhavkumar, MD  Fluticasone-Salmeterol (ADVAIR) 250-50 MCG/DOSE AEPB Inhale 1 puff into the lungs 2 (two) times daily.    Yes [provider]  furosemide (LASIX) 20 MG tablet Take 20 mg by mouth every morning.    Yes [provider]  gabapentin (NEURONTIN) 300 MG capsule Take 600 mg by mouth 4 (four) times daily.  12/11/15  Yes [provider]  hydrOXYzine (ATARAX/VISTARIL) 25 MG tablet Take 1 tablet by mouth  every 6 hours as needed 03/08/16  Yes [provider]  Ipratropium-Albuterol (COMBIVENT) 20-100 MCG/ACT AERS respimat Inhale 2 puffs into the lungs every 6 (six) hours as needed for wheezing or shortness of breath.    Yes [provider]  levETIRAcetam (KEPPRA)  500 MG tablet Take 500 mg by mouth 2 (two) times daily.  10/18/15  Yes [provider]  lipase/protease/amylase (CREON) 12000 units CPEP capsule Take 24,000 Units by mouth 3 (three) times daily with meals.   Yes [provider]  omeprazole (PRILOSEC) 20 MG capsule Take 20 mg by mouth every morning.  11/29/15  Yes [provider]  Oxycodone HCl 20 MG TABS Take 20 mg by mouth 3 (three) times daily as needed (pain).   Yes [provider]  phenytoin (DILANTIN) 100 MG/4ML suspension Take 300 mg by mouth 2 (two) times daily. Takes 9ml by mouth bid   Yes [provider]  potassium chloride (K-DUR) 10 MEQ tablet Take 10 mEq by mouth daily.  05/23/16  Yes [provider]  pravastatin (PRAVACHOL) 10 MG tablet Take 10 mg by mouth daily.   Yes [provider]  promethazine (PHENERGAN) 25 MG tablet Take 25 mg by mouth every 8 (eight) hours as needed for nausea or vomiting.   Yes [provider]  spironolactone (ALDACTONE) 50 MG tablet Take 1 tablet (50 mg total) by mouth 2 (two) times daily. 10/25/15  Yes Vachhani, Vaibhavkumar, MD  tiotropium (SPIRIVA) 18 MCG inhalation capsule Place 18 mcg into inhaler and inhale daily as needed (shortness of breath).    Yes [provider]  traMADol (ULTRAM) 50 MG tablet Take 1 tablet (50 mg total) by mouth every 6 (six) hours as needed for moderate pain. 06/02/16  Yes Smith, Ronald K, PA-C  zolpidem (AMBIEN) 10 MG tablet zolpidem 10 mg tablet by mouth at bedtime 02/13/16  Yes [provider]  acetaminophen-codeine (TYLENOL #3) 300-30 MG tablet acetaminophen 300 mg-codeine 30 mg tablet    [provider]  AFLURIA QUADRIVALENT 0.5 ML injection inject 0.5 milliliter intramuscularly 04/25/17   [provider]  albuterol (PROVENTIL HFA;VENTOLIN HFA) 108 (90 Base) MCG/ACT inhaler Inhale 2 puffs into the lungs every 6 (six) hours as needed for wheezing or shortness of breath.     [provider]  azithromycin (ZITHROMAX) 500 MG tablet azithromycin 500 mg tablet    [provider]  Blood Glucose Monitoring Suppl (FIFTY50 GLUCOSE METER 2.0) w/Device KIT ICD-10 E11.9 Check blood sugars once daily 09/04/15   [provider]  cefUROXime (CEFTIN) 500 MG tablet Take 1 tablet (500 mg total) by mouth 2 (two) times daily with a meal. Patient not taking: Reported on 08/14/2017 07/17/17   Mody, Sital, MD  chlorhexidine (PERIDEX) 0.12 % solution chlorhexidine gluconate 0.12 % mouthwash    [provider]  ciprofloxacin (CIPRO) 500 MG tablet ciprofloxacin 500 mg tablet    [provider]  clindamycin (CLEOCIN) 300 MG capsule clindamycin HCl 300 mg capsule    [provider]    clotrimazole-betamethasone (LOTRISONE) cream clotrimazole-betamethasone 1 %-0.05 % topical cream    [provider]  Desvenlafaxine ER (PRISTIQ) 50 MG TB24 desvenlafaxine ER 50 mg tablet,extended release 24 hr    [provider]  diazepam (VALIUM) 10 MG tablet 1 tab po 30 minutes prior to MRI.  Patient should not drive after taking this medication. 04/17/16   [provider]  ENSURE (ENSURE) Take by mouth. 11/10/15   [provider]  escitalopram (LEXAPRO) 10 MG tablet escitalopram 10 mg tablet 01/04/13   [provider]  fluconazole (DIFLUCAN) 150 MG tablet fluconazole 150 mg tablet    [provider]  Fluticasone-Salmeterol (ADVAIR DISKUS) 250-50 MCG/DOSE AEPB Advair Diskus 250 mcg-50 mcg/dose powder for inhalation    [provider]  hydrOXYzine (VISTARIL) 50 MG capsule hydroxyzine pamoate 50 mg capsule    [provider]  Influenza vac split quadrivalent PF (FLUARIX) 0.5 ML injection Fluarix Quad 2015-2016 (PF) 60 mcg (15 mcg x 4)/0.5 mL IM syringe  inject 0.5 milliliter intramuscularly    [provider]  lactulose (CHRONULAC) 10 GM/15ML solution lactulose 10 gram/15 mL oral solution  09/07/12   [provider]  levofloxacin (LEVAQUIN) 500 MG tablet levofloxacin 500 mg tablet    [provider]  meloxicam (MOBIC) 7.5 MG tablet Take by mouth. 06/16/14   [provider]  metFORMIN (GLUCOPHAGE) 1000 MG tablet metformin 1,000 mg tablet 05/03/16   [provider]  metoCLOPramide (REGLAN) 10 MG tablet metoclopramide 10 mg tablet    [provider]  metoprolol succinate (TOPROL-XL) 25 MG 24 hr tablet metoprolol succinate ER 25 mg tablet,extended release 24 hr    [provider]  MICROLET LANCETS MISC Microlet Lancet    [provider]  mometasone-formoterol (DULERA) 200-5 MCG/ACT AERO Inhale 2 puffs into the lungs 2 (two) times daily.    [provider]  nicotine (NICODERM CQ - DOSED IN MG/24 HR) 7 mg/24hr patch Place onto the skin. 07/30/15   [provider]  nitrofurantoin (MACRODANTIN) 100 MG capsule nitrofurantoin monohydrate/macrocrystals 100 mg capsule 11/27/12   [provider]  ondansetron (ZOFRAN) 4 MG tablet ondansetron HCl 4 mg tablet    [provider]  ONETOUCH DELICA LANCETS 33G MISC USE TO CHECK BLOOD SUGARS  ONCE DAILY 04/25/16   [provider]  oxycodone (OXY-IR) 5 MG capsule oxycodone 5 mg tablet    [provider]  oxyCODONE-acetaminophen (PERCOCET) 10-325 MG tablet oxycodone-acetaminophen 10 mg-325 mg tablet    [provider]  phentermine (ADIPEX-P) 37.5 MG tablet phentermine 37.5 mg tablet  take 1 tablet by mouth once daily    [provider]  pneumococcal 23 valent vaccine (PNEUMOVAX 23) 25 MCG/0.5ML injection Pneumovax 23 25 mcg/0.5 mL injection syringe  inject 0.5 milliliter intramuscularly    [provider]  Polyethylene Glycol 3350 GRAN polyethylene glycol 3350 17 gram/dose oral powder    [provider]  potassium chloride (K-DUR,KLOR-CON) 10 MEQ tablet Take by mouth. 10/11/16 10/11/17  [provider]    predniSONE (DELTASONE) 10 MG tablet prednisone 10 mg tablet    [provider]  prochlorperazine (COMPAZINE) 10 MG tablet Take 10 mg by mouth every 12 (twelve) hours as needed for nausea or vomiting.    [provider]  QUEtiapine (SEROQUEL) 100 MG tablet quetiapine 100 mg tablet 01/06/13   [provider]  saxagliptin HCl (ONGLYZA) 5 MG TABS tablet Onglyza 5 mg tablet    [provider]  sertraline (ZOLOFT) 100 MG   tablet Take 200 mg by mouth daily.    [provider]  SHINGRIX injection inject 0.5 milliliter intramuscularly 04/25/17   [provider]  solifenacin (VESICARE) 10 MG tablet Vesicare 10 mg tablet    [provider]  sucralfate (CARAFATE) 1 g tablet Take 1 g by mouth 4 (four) times daily. 06/02/17   [provider]  temazepam (RESTORIL) 30 MG capsule temazepam 30 mg capsule    [provider]  terconazole (TERAZOL 3) 80 MG vaginal suppository terconazole 80 mg vaginal suppository    [provider]  terconazole (TERAZOL 7) 0.4 % vaginal cream insert vaginally 1 applicatorful once daily at bedtime for 7 days 03/20/16   [provider]  triamcinolone (NASACORT) 55 MCG/ACT AERO nasal inhaler Place into the nose.    [provider]  triamcinolone cream (KENALOG) 0.1 % apply to affected area twice a day 02/07/16   [provider]  Venlafaxine HCl 150 MG TB24 venlafaxine ER 150 mg tablet,extended release 24 hr    [provider]  venlafaxine XR (EFFEXOR-XR) 75 MG 24 hr capsule venlafaxine ER 75 mg capsule,extended release 24 hr    [provider]  zolpidem (AMBIEN) 5 MG tablet zolpidem 5 mg tablet 02/28/16   [provider]    Family History  Problem Relation Age of Onset  . Hypertension Mother   . Heart disease Father   . Diabetes Father   . Cancer Sister        brain  . Cancer Grandchild 8       brain tumor     Social History   Tobacco Use   . Smoking status: Current Every Day Smoker    Packs/day: 1.00    Types: Cigarettes  . Smokeless tobacco: Never Used  Substance Use Topics  . Alcohol use: Yes    Alcohol/week: 2.4 oz    Types: 4 Cans of beer per week  . Drug use: Yes    Types: Cocaine    Allergies as of 08/14/2017 - Review Complete 08/14/2017  Allergen Reaction Noted  . Ibuprofen Other (See Comments) 11/04/2014  . Sulfa antibiotics Hives 10/21/2012  . Hydroxyzine Other (See Comments) 01/20/2016  . Amoxicillin Itching, Rash, and Other (See Comments) 02/11/2014  . Chocolate Rash 11/03/2012  . Chocolate flavor Rash 02/18/2013  . Ciprofloxacin Itching and Rash 01/31/2015  . Dilaudid [hydromorphone hcl] Itching 10/21/2012  . Fentanyl Rash 11/04/2014  . Hydromorphone Itching 02/11/2014  . Hydromorphone hcl Itching 10/21/2012  . Metformin Nausea And Vomiting 06/16/2015  . Penicillins Itching, Rash, and Other (See Comments) 10/21/2012  . Strawberry extract Rash 11/03/2012    Review of Systems:    All systems reviewed and negative except where noted in HPI.   Physical Exam:  Vital signs in last 24 hours: Temp:  [98.3 F (36.8 C)-98.6 F (37 C)] 98.3 F (36.8 C) (02/07 1706) Pulse Rate:  [90-120] 90 (02/07 1706) Resp:  [13-29] 23 (02/07 0930) BP: (92-117)/(55-82) 92/61 (02/07 1706) SpO2:  [92 %-97 %] 97 % (02/07 1706) Weight:  [280 lb (127 kg)] 280 lb (127 kg) (02/07 0338)   General:   Pleasant, cooperative in NAD Head:  Normocephalic and atraumatic. Eyes:   No icterus.   Conjunctiva pink. PERRLA. Ears:  Normal auditory acuity. Neck:  Supple; no masses or thyroidomegaly Lungs: Respirations even and unlabored. Lungs clear to auscultation bilaterally.   No wheezes, crackles, or rhonchi.  Heart:  Regular rate and rhythm;  Without murmur, clicks, rubs or   gallops Abdomen:  Soft, nondistended, nontender. Normal bowel sounds. No appreciable masses or hepatomegaly.  No rebound or guarding.  Rectal:  Not  performed. Msk:  Symmetrical without gross deformities.  Strength normal Extremities:  Without edema, cyanosis or clubbing. Neurologic:  Alert and oriented x3;  grossly normal neurologically. Skin:  Intact without significant lesions or rashes. Cervical Nodes:  No significant cervical adenopathy. Psych:  Alert and cooperative. Normal affect.  LAB RESULTS: CBC Latest Ref Rng & Units 08/14/2017 08/14/2017 07/16/2017  WBC 3.6 - 11.0 K/uL - 5.7 -  Hemoglobin 12.0 - 16.0 g/dL 10.2(L) 11.5(L) 11.3(L)  Hematocrit 35.0 - 47.0 % 32.2(L) 35.5 36.1  Platelets 150 - 440 K/uL - 146(L) -    BMET BMP Latest Ref Rng & Units 08/14/2017 07/16/2017 07/15/2017  Glucose 65 - 99 mg/dL 136(H) 80 128(H)  BUN 6 - 20 mg/dL 7 5(L) 6  Creatinine 0.44 - 1.00 mg/dL 0.74 0.85 0.94  Sodium 135 - 145 mmol/L 142 139 139  Potassium 3.5 - 5.1 mmol/L 4.2 3.8 3.5  Chloride 101 - 111 mmol/L 111 111 108  CO2 22 - 32 mmol/L 19(L) 22 23  Calcium 8.9 - 10.3 mg/dL 9.3 8.3(L) 8.6(L)    LFT Hepatic Function Latest Ref Rng & Units 08/14/2017 07/15/2017 07/13/2017  Total Protein 6.5 - 8.1 g/dL 7.9 7.1 7.7  Albumin 3.5 - 5.0 g/dL 3.3(L) 2.9(L) 3.0(L)  AST 15 - 41 U/L 51(H) 32 37  ALT 14 - 54 U/L 20 20 25  Alk Phosphatase 38 - 126 U/L 122 89 98  Total Bilirubin 0.3 - 1.2 mg/dL 0.9 0.8 0.8     STUDIES: No results found.    Impression / Plan:   [name] is a 61 y.o. 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 She is now readmitted with another episode of rectal bleeding, hemodynamically insignificant bleed. Hemoglobin is at baseline. This is consistent with outlet bleeding.  Recommend flexible sigmoidoscopy tomorrow to evaluate the rectum and possible treatment  Clear liquid diet  MiraLAX prep Nothing by mouth past midnight Tapwater enema tomorrow Check  ferritin, iron, TIBC, B12  I have discussed alternative options, risks & benefits,  which include, but are not limited to, bleeding, infection, perforation,respiratory complication & drug reaction.  The patient agrees with this plan & written consent will be obtained.    Thank you for involving me in the care of this patient.      LOS: 0 days   Rohini Vanga, MD  08/14/2017, 6:47 PM   Note: This dictation was prepared with Dragon dictation along with smaller phrase technology. Any transcriptional errors that result from this process are unintentional. 

## 2017-08-14 NOTE — ED Triage Notes (Signed)
Pt bib [facility] from home d/t rectal bleeding, seen here last week for same but reports endo and colonoscopy negative. Pt reports heavy clots and bright red blood from rectum. Pt had BM upon arrival and reports confirmed with this RN. VSS, pt A&O.

## 2017-08-14 NOTE — ED Provider Notes (Signed)
[facility] Emergency Department Provider Note    First MD Initiated Contact with Patient 08/14/17 0320     (approximate)  I have reviewed the triage vital signs and the nursing notes.   HISTORY  Chief Complaint Rectal Bleeding    HPI [name] is a 62 y.o. female with below list of chronic medical conditions including recent admission for GI bleed presents to the emergency department with bright red blood per rectum times 1 week.  Patient admits to clots and bright red blood from rectum accompanied by generalized abdominal pain which is currently 9 out of 10.  Review of the patient's last admission including CT scan performed on 07/14/2017 revealed cirrhosis of the liver, negative colonoscopy and endoscopy.  Past Medical History:  Diagnosis Date  . Allergy   . Anemia   . Anxiety   . CHF (congestive heart failure) ([place])   . Chronic bronchitis ([place])   . Chronic kidney disease   . Cirrhosis ([place])   . Depression   . Diabetes mellitus without complication ([place])   . Diverticulitis   . Emphysema of lung ([place])   . GERD (gastroesophageal reflux disease)   . H/O drug abuse    Clean since 2009  . H/O ETOH abuse    Sober since 2009  . Hepatitis C   . History of sepsis 2016  . Hypertension   . Malignant brain tumor ([place]) 2007  . Migraines   . Pancreatic ascites   . Pancreatitis, alcoholic 4076  . Rectovaginal fistula   . Seizures [facility])    Last seizure October 2017  . Spleen enlarged   . Stroke [facility]) 2007, 2008   during brain surgery  . Urine incontinence     Patient Active Problem List   Diagnosis Date Noted  . Chronic hepatitis C with hepatic coma ([place]) 07/28/2017  . Closed fracture of phalanx of foot 07/28/2017  . Secondary esophageal varices without bleeding ([place])   . Rectal bleeding   . Vaginal candida 12/11/2016  . Complete tear of left rotator cuff 04/26/2016  . Rotator cuff tendinitis, left 04/26/2016  . Injury of tendon of long  head of left biceps 04/26/2016  . 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 Infection 02/26/2016  . Acute intractable headache 02/24/2016  . Acute headache 02/24/2016  . Osteomyelitis of skull ([place])   . Encephalopathy   . Status post craniectomy 12/28/2015  . Wound infection after surgery 12/26/2015  . UTI (lower urinary tract infection) 10/22/2015  . Chest pain on breathing   . SOB (shortness of breath)   . Wheezing   . Subdural hematoma ([place]) 08/16/2015  . GI bleed 06/24/2015  . Pyelonephritis 06/24/2015  . Sepsis ([place]) 06/22/2015  . Orthostatic hypotension 05/15/2015  . Alcohol-induced acute pancreatitis 03/13/2015  . Abdominal pain 02/08/2015  . Chronic pain syndrome 02/08/2015  . Closed fracture of coccyx ([place]) 02/02/2015  . COPD (chronic obstructive pulmonary disease) ([place]) 02/02/2015  . Primary insomnia 02/02/2015  . Gastro-esophageal reflux disease with esophagitis 02/02/2015  . Stress incontinence in female 02/02/2015  . ALC (alcoholic liver cirrhosis) ([place]) 12/23/2014  . Depression 12/23/2014  . Anxiety 12/23/2014  . Type 2 diabetes mellitus ([place]) 12/23/2014  . Alcoholic cirrhosis ([place]) 80/88/1103  . HTN (hypertension) 11/05/2014  . Liver cirrhosis ([place]) 07/19/2014  . Liver failure ([place]) 02/18/2013  . 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 Digestive-genital tract fistula, female 12/30/2012  . Chronic pancreatitis ([place]) 11/03/2012  . Seizure disorder ([place]) 11/03/2012  . Benign  meningioma of brain ([place]) 11/03/2012  . Chronic pelvic pain in female 11/03/2012  . Hepatitis C virus infection without hepatic coma 11/03/2012  . UTI (urinary tract infection) 11/03/2012  . Alcohol abuse, unspecified 11/03/2012  . Chronic liver disease 11/03/2012  . Schizophrenia ([place]) 11/03/2012  . Narcotic abuse ([place]) 11/03/2012  . Benign neoplasm of cerebral meninges ([place]) 11/03/2012  . Breast screening 11/03/2012  . Convulsions, epileptic ([place]) 11/03/2012  . Nondependent alcohol abuse 11/03/2012    . 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 Female genital symptoms 11/03/2012  . Meningioma ([place]) 11/03/2012    Past Surgical History:  Procedure Laterality Date  . ABDOMINAL HYSTERECTOMY  1979  . APPENDECTOMY  1999  . BRAIN SURGERY  2007   malignant brain tumor  . CHOLECYSTECTOMY  2001  . COLON SURGERY  2006  . COLONOSCOPY N/A 07/17/2017   Procedure: COLONOSCOPY;  Surgeon: Lin Landsman, MD;  Location: [facility] ENDOSCOPY;  Service: Gastroenterology;  Laterality: N/A;  . CRANIOPLASTY N/A 09/15/2015   Procedure: Cranioplasty with retrieval of bone flap from abdominal pocket;  Surgeon: Ashok Pall, MD;  Location: [place] NEURO ORS;  Service: Neurosurgery;  Laterality: N/A;  Cranioplasty with retrieval of bone flap from abdominal pocket  . CRANIOTOMY Right 08/16/2015   Procedure: Right Fronto-Temporal-Parietal Craniotomy for Evacuation of Hematoma with Bone Flap Placement in Abdomen;  Surgeon: Ashok Pall, MD;  Location: [place] NEURO ORS;  Service: Neurosurgery;  Laterality: Right;  . CRANIOTOMY N/A 12/28/2015   Procedure: Craniectomy for wound debridement;  Surgeon: Ashok Pall, MD;  Location: [place] NEURO ORS;  Service: Neurosurgery;  Laterality: N/A;  Craniectomy for wound debridement  . DILATION AND CURETTAGE OF UTERUS    . ESOPHAGOGASTRODUODENOSCOPY N/A 07/17/2017   Procedure: ESOPHAGOGASTRODUODENOSCOPY (EGD);  Surgeon: Lin Landsman, MD;  Location: [facility] ENDOSCOPY;  Service: Gastroenterology;  Laterality: N/A;  . EYE SURGERY    . JOINT REPLACEMENT Left    TOTAL KNEE REPLACEMENT  . rectovaginal fistula repair w/ colostomy  2011  . REPLACEMENT TOTAL KNEE Left 2005    Prior to Admission medications   Medication Sig Start Date End Date Taking? Authorizing Provider  albuterol (PROAIR HFA) 108 (90 Base) MCG/ACT inhaler ProAir HFA 90 mcg/actuation aerosol inhaler  inhale 1 puff by mouth every 6 hours if needed   Yes [provider]   albuterol (PROVENTIL) (5 MG/ML) 0.5% nebulizer solution Take 2.5 mg by nebulization every 6 (six) hours as needed for wheezing or shortness of breath.    Yes [provider]  ALPRAZolam Duanne Moron) 1 MG tablet Take 1 tablet (1 mg total) by mouth at bedtime as needed. 07/17/17  Yes Mody, Ulice Bold, MD  amitriptyline (ELAVIL) 50 MG tablet Take 50 mg by mouth at bedtime.   Yes [provider]  amLODipine (NORVASC) 10 MG tablet Take 10 mg by mouth daily.   Yes [provider]  busPIRone (BUSPAR) 10 MG tablet Take 10 mg by mouth daily.  06/17/14  Yes [provider]  citalopram (CELEXA) 20 MG tablet 1 tablet by mouth daily   Yes [provider]  diphenhydrAMINE (BENADRYL) 25 mg capsule Take 1 capsule (25 mg total) by mouth every 8 (eight) hours as needed for itching. 10/25/15  Yes Vaughan Basta, MD  Fluticasone-Salmeterol (ADVAIR) 250-50 MCG/DOSE AEPB Inhale 1 puff into the lungs 2 (two) times daily.    Yes [provider]  furosemide (LASIX) 20 MG tablet Take 20 mg by mouth every morning.  Yes [provider]  gabapentin (NEURONTIN) 300 MG capsule Take 600 mg by mouth 4 (four) times daily.  12/11/15  Yes [provider]  hydrOXYzine (ATARAX/VISTARIL) 25 MG tablet Take 1 tablet by mouth  every 6 hours as needed 03/08/16  Yes [provider]  Ipratropium-Albuterol (COMBIVENT) 20-100 MCG/ACT AERS respimat Inhale 2 puffs into the lungs every 6 (six) hours as needed for wheezing or shortness of breath.    Yes [provider]  levETIRAcetam (KEPPRA) 500 MG tablet Take 500 mg by mouth 2 (two) times daily.  10/18/15  Yes [provider]  lipase/protease/amylase (CREON) 12000 units CPEP capsule Take 24,000 Units by mouth 3 (three) times daily with meals.   Yes [provider]  omeprazole (PRILOSEC) 20 MG capsule Take 20 mg by mouth every morning.  11/29/15  Yes [provider]  Oxycodone HCl 20 MG TABS  Take 20 mg by mouth 3 (three) times daily as needed (pain).   Yes [provider]  phenytoin (DILANTIN) 100 MG/4ML suspension Take 300 mg by mouth 2 (two) times daily. Takes 44m by mouth bid   Yes [provider]  potassium chloride (K-DUR) 10 MEQ tablet Take 10 mEq by mouth daily.  05/23/16  Yes [provider]  pravastatin (PRAVACHOL) 10 MG tablet Take 10 mg by mouth daily.   Yes [provider]  promethazine (PHENERGAN) 25 MG tablet Take 25 mg by mouth every 8 (eight) hours as needed for nausea or vomiting.   Yes [provider]  spironolactone (ALDACTONE) 50 MG tablet Take 1 tablet (50 mg total) by mouth 2 (two) times daily. 10/25/15  Yes VVaughan Basta MD  tiotropium (SPIRIVA) 18 MCG inhalation capsule Place 18 mcg into inhaler and inhale daily as needed (shortness of breath).    Yes [provider]  traMADol (ULTRAM) 50 MG tablet Take 1 tablet (50 mg total) by mouth every 6 (six) hours as needed for moderate pain. 06/02/16  Yes SSable Feil PA-C  zolpidem (AMBIEN) 10 MG tablet zolpidem 10 mg tablet by mouth at bedtime 02/13/16  Yes [provider]  acetaminophen-codeine (TYLENOL #3) 300-30 MG tablet acetaminophen 300 mg-codeine 30 mg tablet    [provider]  AFLURIA QUADRIVALENT 0.5 ML injection inject 0.5 milliliter intramuscularly 04/25/17   [provider]  albuterol (PROVENTIL HFA;VENTOLIN HFA) 108 (90 Base) MCG/ACT inhaler Inhale 2 puffs into the lungs every 6 (six) hours as needed for wheezing or shortness of breath.    [provider]  azithromycin (ZITHROMAX) 500 MG tablet azithromycin 500 mg tablet    [provider]  Blood Glucose Monitoring Suppl (FIFTY50 GLUCOSE METER 2.0) w/Device KIT ICD-10 E11.9 Check blood sugars once daily 09/04/15   [provider]  cefUROXime (CEFTIN) 500 MG tablet Take 1 tablet (500 mg total) by mouth 2 (two) times daily with a meal. Patient  not taking: Reported on 08/14/2017 07/17/17   MBettey Costa MD  chlorhexidine (PERIDEX) 0.12 % solution chlorhexidine gluconate 0.12 % mouthwash    [provider]  ciprofloxacin (CIPRO) 500 MG tablet ciprofloxacin 500 mg tablet    [provider]  clindamycin (CLEOCIN) 300 MG capsule clindamycin HCl 300 mg capsule    [provider]  clotrimazole-betamethasone (LOTRISONE) cream clotrimazole-betamethasone 1 %-0.05 % topical cream    [provider]  Desvenlafaxine ER (PRISTIQ) 50 MG TB24 desvenlafaxine ER 50 mg tablet,extended release 24 hr    [provider]  diazepam (VALIUM) 10 MG tablet  1 tab po 30 minutes prior to MRI.  Patient should not drive after taking this medication. 04/17/16   [provider]  ENSURE (ENSURE) Take by mouth. 11/10/15   [provider]  escitalopram (LEXAPRO) 10 MG tablet escitalopram 10 mg tablet 01/04/13   [provider]  fluconazole (DIFLUCAN) 150 MG tablet fluconazole 150 mg tablet    [provider]  Fluticasone-Salmeterol (ADVAIR DISKUS) 250-50 MCG/DOSE AEPB Advair Diskus 250 mcg-50 mcg/dose powder for inhalation    [provider]  hydrOXYzine (VISTARIL) 50 MG capsule hydroxyzine pamoate 50 mg capsule    [provider]  Influenza vac split quadrivalent PF (FLUARIX) 0.5 ML injection Fluarix Quad 2015-2016 (PF) 60 mcg (15 mcg x 4)/0.5 mL IM syringe  inject 0.5 milliliter intramuscularly    [provider]  lactulose (CHRONULAC) 10 GM/15ML solution lactulose 10 gram/15 mL oral solution 09/07/12   [provider]  levofloxacin (LEVAQUIN) 500 MG tablet levofloxacin 500 mg tablet    [provider]  meloxicam (MOBIC) 7.5 MG tablet Take by mouth. 06/16/14   [provider]  metFORMIN (GLUCOPHAGE) 1000 MG tablet metformin 1,000 mg tablet 05/03/16   [provider]  metoCLOPramide (REGLAN) 10 MG tablet metoclopramide 10 mg tablet     [provider]  metoprolol succinate (TOPROL-XL) 25 MG 24 hr tablet metoprolol succinate ER 25 mg tablet,extended release 24 hr    [provider]  [place] Lancet    [provider]  mometasone-formoterol (DULERA) 200-5 MCG/ACT AERO Inhale 2 puffs into the lungs 2 (two) times daily.    [provider]  nicotine (NICODERM CQ - DOSED IN MG/24 HR) 7 mg/24hr patch Place onto the skin. 07/30/15   [provider]  nitrofurantoin (MACRODANTIN) 100 MG capsule nitrofurantoin monohydrate/macrocrystals 100 mg capsule 11/27/12   [provider]  ondansetron (ZOFRAN) 4 MG tablet ondansetron HCl 4 mg tablet    [provider]  ONETOUCH DELICA LANCETS 29V MISC USE TO CHECK BLOOD SUGARS  ONCE DAILY 04/25/16   [provider]  oxycodone (OXY-IR) 5 MG capsule oxycodone 5 mg tablet    [provider]  oxyCODONE-acetaminophen (PERCOCET) 10-325 MG tablet oxycodone-acetaminophen 10 mg-325 mg tablet    [provider]  phentermine (ADIPEX-P) 37.5 MG tablet phentermine 37.5 mg tablet  take 1 tablet by mouth once daily    [provider]  pneumococcal 23 valent vaccine (PNEUMOVAX 23) 25 MCG/0.5ML injection Pneumovax 23 25 mcg/0.5 mL injection syringe  inject 0.5 milliliter intramuscularly    [provider]  Polyethylene Glycol 3350 GRAN polyethylene glycol 3350 17 gram/dose oral powder    [provider]  potassium chloride (K-DUR,KLOR-CON) 10 MEQ tablet Take by mouth. 10/11/16 10/11/17  [provider]  predniSONE (DELTASONE) 10 MG tablet prednisone 10 mg tablet    [provider]  prochlorperazine (COMPAZINE) 10 MG tablet Take 10 mg by mouth every 12 (twelve) hours as needed for nausea or vomiting.    [provider]  QUEtiapine (SEROQUEL) 100 MG tablet quetiapine 100 mg tablet 01/06/13   [provider]  saxagliptin HCl (ONGLYZA) 5 MG TABS tablet  Onglyza 5 mg tablet    [provider]  sertraline (ZOLOFT) 100 MG tablet Take 200 mg by mouth daily.    [provider]  [facility] injection inject 0.5 milliliter intramuscularly 04/25/17   [provider]  solifenacin (VESICARE) 10 MG tablet Vesicare 10 mg tablet    [provider]  sucralfate (CARAFATE) 1 g tablet Take 1 g by mouth 4 (four) times daily. 06/02/17   [provider]  temazepam (RESTORIL) 30 MG capsule temazepam 30 mg capsule    [provider]  terconazole (TERAZOL 3) 80 MG vaginal suppository terconazole 80 mg vaginal suppository    [provider]  terconazole (TERAZOL 7) 0.4 % vaginal cream insert vaginally 1 applicatorful once daily at bedtime for 7 days 03/20/16   [provider]  triamcinolone (NASACORT) 55 MCG/ACT AERO nasal inhaler Place into the nose.    [provider]  triamcinolone cream (KENALOG) 0.1 % apply to affected area twice a day 02/07/16   [provider]  Venlafaxine HCl 150 MG TB24 venlafaxine ER 150 mg tablet,extended release 24 hr    [provider]  venlafaxine XR (EFFEXOR-XR) 75 MG 24 hr capsule venlafaxine ER 75 mg capsule,extended release 24 hr    [provider]  zolpidem (AMBIEN) 5 MG tablet zolpidem 5 mg tablet 02/28/16   [provider]    Allergies Ibuprofen; Sulfa antibiotics; Hydroxyzine; Amoxicillin; Chocolate; Chocolate flavor; Ciprofloxacin; Dilaudid [hydromorphone hcl]; Fentanyl; Hydromorphone; Hydromorphone hcl; Metformin; Penicillins; and Strawberry extract  Family History  Problem Relation Age of Onset  . Hypertension Mother   . Heart disease Father   . Diabetes Father   . Cancer Sister        brain  . Cancer Grandchild 8       brain tumor    Social History Social History   Tobacco Use  . Smoking status: Current Every Day Smoker    Packs/day: 1.00    Types: Cigarettes  . Smokeless tobacco: Never Used  Substance  Use Topics  . Alcohol use: Yes    Alcohol/week: 2.4 oz    Types: 4 Cans of beer per week  . Drug use: Yes    Types: Cocaine    Review of Systems Constitutional: No fever/chills Eyes: No visual changes. ENT: No sore throat. Cardiovascular: Denies chest pain. Respiratory: Denies shortness of breath. Gastrointestinal: Positive for abdominal pain and bright red blood per rectum Genitourinary: Negative for dysuria. Musculoskeletal: Negative for neck pain.  Negative for back pain. Integumentary: Negative for rash. Neurological: Negative for headaches, focal weakness or numbness.   ____________________________________________   PHYSICAL EXAM:  VITAL SIGNS: ED Triage Vitals  Enc Vitals Group     BP 08/14/17 0322 114/79     Pulse Rate 08/14/17 0323 (!) 119     Resp 08/14/17 0325 13     Temp 08/14/17 0338 98.6 F (37 C)     Temp Source 08/14/17 0338 Oral     SpO2 08/14/17 0323 94 %     Weight 08/14/17 0338 127 kg (280 lb)     Height 08/14/17 0338 1.727 m (_0 )     Head Circumference --      Peak Flow --      Pain Score 08/14/17 0338 9     Pain Loc --      Pain Edu? --      Excl. in [place]? --     Constitutional: Alert and oriented. Well appearing and in no acute distress. Eyes: Conjunctivae are normal.  Head: Atraumatic. Mouth/Throat: Mucous membranes are moist.  Oropharynx non-erythematous. Neck: No stridor.   Cardiovascular: Normal rate, regular rhythm. Good peripheral circulation. Grossly normal heart sounds. Respiratory: Normal respiratory effort.  No retractions. Lungs CTAB. Gastrointestinal: Generalized tenderness to very mild palpation. No distention.  Guaiac positive stools Musculoskeletal: No lower  extremity tenderness nor edema. No gross deformities of extremities. Neurologic:  Normal speech and language. No gross focal neurologic deficits are appreciated.  Skin:  Skin is warm, dry and intact. No rash noted. Psychiatric: Mood and affect are normal. Speech and  behavior are normal.  ____________________________________________   LABS (all labs ordered are listed, but only abnormal results are displayed)  Labs Reviewed  COMPREHENSIVE METABOLIC PANEL - Abnormal; Notable for the following components:      Result Value   CO2 19 (*)    Glucose, Bld 136 (*)    Albumin 3.3 (*)    AST 51 (*)    All other components within normal limits  CBC WITH DIFFERENTIAL/PLATELET - Abnormal; Notable for the following components:   Hemoglobin 11.5 (*)    RDW 16.9 (*)    Platelets 146 (*)    All other components within normal limits  LIPASE, BLOOD - Abnormal; Notable for the following components:   Lipase 55 (*)    All other components within normal limits  ETHANOL - Abnormal; Notable for the following components:   Alcohol, Ethyl (B) 22 (*)    All other components within normal limits  PROTIME-INR - Abnormal; Notable for the following components:   Prothrombin Time 15.5 (*)    All other components within normal limits  HEMOGLOBIN AND HEMATOCRIT, BLOOD  TYPE AND SCREEN  TYPE AND SCREEN   ____________________________________________  EKG  ED ECG REPORT I, Sur[place] N Tyeisha Dinan, the attending physician, personally viewed and interpreted this ECG.   Date: 08/14/2017  EKG Time: 3:26 AM  Rate: 116  Rhythm: Sinus tachycardia  Axis: Normal  Intervals: Normal  ST&T Change: None    Procedures   ____________________________________________   INITIAL IMPRESSION / ASSESSMENT AND PLAN / ED COURSE  As part of my medical decision making, I reviewed the following data within the electronic MEDICAL RECORD [id number] year old female presented with above-stated history of bright red blood per rectum following recent admission for the same.  Patient's globin obtained on arrival 11.5 which is consistent with hemoglobin from 07/16/2017 of 11.3.  Suspect possible hemorrhoidal bleeding secondary to cirrhosis of the liver as etiology bleeding given negative colonoscopy.   Patient's initial hemoglobin 11.5 however repeat hemoglobin 3 hours later was 10 such patient discussed with Dr. Marcille Blanco for hospital admission for further evaluation and management of GI bleed with anemia ________________________  FINAL CLINICAL IMPRESSION(S) / ED DIAGNOSES  Final diagnoses:  Acute GI bleeding     MEDICATIONS GIVEN DURING THIS VISIT:  Medications  sodium chloride 0.9 % bolus 1,000 mL (0 mLs Intravenous Stopped 08/14/17 0618)  morphine 4 MG/ML injection 4 mg (4 mg Intravenous Given 08/14/17 0615)     ED Discharge Orders    None       Note:  This document was prepared using Dragon voice recognition software and may include unintentional dictation errors.    Gregor Hams, MD 08/14/17 [phone]

## 2017-08-15 ENCOUNTER — Encounter: Payer: Self-pay | Admitting: Anesthesiology

## 2017-08-15 ENCOUNTER — Encounter: Payer: Self-pay | Admitting: *Deleted

## 2017-08-15 DIAGNOSIS — K625 Hemorrhage of anus and rectum: Secondary | ICD-10-CM | POA: Diagnosis not present

## 2017-08-15 DIAGNOSIS — K922 Gastrointestinal hemorrhage, unspecified: Secondary | ICD-10-CM | POA: Diagnosis not present

## 2017-08-15 DIAGNOSIS — K921 Melena: Secondary | ICD-10-CM | POA: Diagnosis not present

## 2017-08-15 LAB — BASIC METABOLIC PANEL
Anion gap: 8 (ref 5–15)
BUN: 7 mg/dL (ref 6–20)
CO2: 23 mmol/L (ref 22–32)
CREATININE: 0.92 mg/dL (ref 0.44–1.00)
Calcium: 8.4 mg/dL — ABNORMAL LOW (ref 8.9–10.3)
Chloride: 113 mmol/L — ABNORMAL HIGH (ref 101–111)
GFR calc Af Amer: >60 mL/min (ref >60)
GFR calc non Af Amer: >60 mL/min (ref >60)
GLUCOSE: 122 mg/dL — AB (ref 65–99)
POTASSIUM: 3.5 mmol/L (ref 3.5–5.1)
Sodium: 144 mmol/L (ref 135–145)

## 2017-08-15 LAB — CBC
HEMATOCRIT: 32.1 % — AB (ref 35.0–47.0)
Hemoglobin: 9.9 g/dL — ABNORMAL LOW (ref 12.0–16.0)
MCH: 26.9 pg (ref 26.0–34.0)
MCHC: 31 g/dL — AB (ref 32.0–36.0)
MCV: 86.5 fL (ref 80.0–100.0)
Platelets: 103 10*3/uL — ABNORMAL LOW (ref 150–440)
RBC: 3.7 MIL/uL — ABNORMAL LOW (ref 3.80–5.20)
RDW: 16.8 % — AB (ref 11.5–14.5)
WBC: 3.2 10*3/uL — ABNORMAL LOW (ref 3.6–11.0)

## 2017-08-15 LAB — GLUCOSE, CAPILLARY
GLUCOSE-CAPILLARY: 98 mg/dL (ref 65–99)
GLUCOSE-CAPILLARY: 185 mg/dL — AB (ref 65–99)
Glucose-Capillary: 110 mg/dL — ABNORMAL HIGH (ref 65–99)

## 2017-08-15 LAB — HEMOGLOBIN A1C
HEMOGLOBIN A1C: 5.3 % (ref 4.8–5.6)
MEAN PLASMA GLUCOSE: 105.41 mg/dL

## 2017-08-15 LAB — MRSA PCR SCREENING: MRSA BY PCR: NEGATIVE

## 2017-08-15 LAB — VITAMIN B12: VITAMIN B 12: 472 pg/mL (ref 180–914)

## 2017-08-15 MED ORDER — PANCRELIPASE (LIP-PROT-AMYL) 12000-38000 UNITS PO CPEP
24000.0000 [IU] | ORAL_CAPSULE | Freq: Three times a day (TID) | ORAL | Status: DC
  Administered 2017-08-16 – 2017-08-17 (×3): 24000 [IU] via ORAL
  Filled 2017-08-15 (×7): qty 2

## 2017-08-15 MED ORDER — SODIUM CHLORIDE 0.9 % IV SOLN
510.0000 mg | Freq: Once | INTRAVENOUS | Status: AC
  Administered 2017-08-15: 510 mg via INTRAVENOUS
  Filled 2017-08-15: qty 17

## 2017-08-15 MED ORDER — GABAPENTIN 300 MG PO CAPS: 300.0000 mg | ORAL_CAPSULE | Freq: Four times a day (QID) | ORAL | Status: DC

## 2017-08-15 MED ORDER — IPRATROPIUM-ALBUTEROL 0.5-2.5 (3) MG/3ML IN SOLN: 3.0000 mL | Freq: Four times a day (QID) | RESPIRATORY_TRACT | Status: DC | PRN

## 2017-08-15 MED ORDER — SODIUM CHLORIDE 0.9 % IV SOLN
INTRAVENOUS | Status: DC
  Administered 2017-08-15: 1000 mL via INTRAVENOUS

## 2017-08-15 MED ORDER — GABAPENTIN 300 MG PO CAPS
600.0000 mg | ORAL_CAPSULE | Freq: Four times a day (QID) | ORAL | Status: DC
  Administered 2017-08-15 – 2017-08-17 (×5): 600 mg via ORAL
  Filled 2017-08-15 (×5): qty 2

## 2017-08-15 NOTE — Progress Notes (Signed)
[place] [facility] [address] NAME: [name]    MR#:  [id number]  DATE OF BIRTH:  1955/12/29  SUBJECTIVE:  CHIEF COMPLAINT:   Chief Complaint  Patient presents with  . Rectal Bleeding     Recurrent GI bleed, alcoholic. Came with blood clots in stool.   No more episodes of bleeding in last few hours. Plan for sigmoidoscopy today.  REVIEW OF SYSTEMS:  CONSTITUTIONAL: No fever,positive for fatigue or weakness.  EYES: No blurred or double vision.  EARS, NOSE, AND THROAT: No tinnitus or ear pain.  RESPIRATORY: No cough, shortness of breath, wheezing or hemoptysis.  CARDIOVASCULAR: No chest pain, orthopnea, edema.  GASTROINTESTINAL: No nausea, vomiting, diarrhea or abdominal pain.  GENITOURINARY: No dysuria, hematuria.  ENDOCRINE: No polyuria, nocturia,  HEMATOLOGY: No anemia, easy bruising or bleeding SKIN: No rash or lesion. MUSCULOSKELETAL: No joint pain or arthritis.   NEUROLOGIC: No tingling, numbness, weakness.  PSYCHIATRY: No anxiety or depression.   ROS  DRUG ALLERGIES:   Allergies  Allergen Reactions  . Ibuprofen Other (See Comments)    Pt states that she is unable to take this medication because of her liver.    . Sulfa Antibiotics Hives  . Hydroxyzine Other (See Comments)    Causes crying   . Amoxicillin Itching, Rash and Other (See Comments)    Has patient had a PCN reaction causing immediate rash, facial/tongue/throat swelling, SOB or lightheadedness with hypotension: No Has patient had a PCN reaction causing severe rash involving mucus membranes or skin necrosis: No Has patient had a PCN reaction that required hospitalization No Has patient had a PCN reaction occurring within the last 10 years: Yes If all of the above answers are "NO", then may proceed with Cephalosporin use.  . Chocolate Rash  . Chocolate Flavor Rash  . Ciprofloxacin Itching and Rash  . Dilaudid [Hydromorphone Hcl] Itching  . Fentanyl Rash  .  Hydromorphone Itching  . Hydromorphone Hcl Itching  . Metformin Nausea And Vomiting  . Penicillins Itching, Rash and Other (See Comments)    Has patient had a PCN reaction causing immediate rash, facial/tongue/throat swelling, SOB or lightheadedness with hypotension: No Has patient had a PCN reaction causing severe rash involving mucus membranes or skin necrosis: No Has patient had a PCN reaction that required hospitalization No Has patient had a PCN reaction occurring within the last 10 years: Yes If all of the above answers are "NO", then may proceed with Cephalosporin use.  . Strawberry Extract Rash    VITALS:  Blood pressure 110/69, pulse 86, temperature 97.6 F (36.4 C), temperature source Oral, resp. rate 18, height 5\' 8"  (1.727 m), weight 127 kg (280 lb), SpO2 97 %.  PHYSICAL EXAMINATION:   GENERAL:  62 y.o.-year-old morbidly obese patient lying in the bed with no acute distress.  EYES: Pupils equal, round, reactive to light and accommodation. No scleral icterus. Extraocular muscles intact.  HEENT: Head atraumatic, normocephalic. Oropharynx and nasopharynx clear.  NECK:  Supple, no jugular venous distention. No thyroid enlargement, no tenderness.  LUNGS: Normal breath sounds bilaterally, no wheezing, rales,rhonchi or crepitation. No use of accessory muscles of respiration.  CARDIOVASCULAR: S1, S2 normal. No murmurs, rubs, or gallops.  ABDOMEN: Soft, nontender, nondistended. Bowel sounds present. No organomegaly or mass.  EXTREMITIES: No pedal edema, cyanosis, or clubbing.  NEUROLOGIC: Cranial nerves II through XII are intact. Muscle strength 5/5 in all extremities. Sensation intact. Gait not checked.  PSYCHIATRIC: The patient is alert and  oriented x 3.  SKIN: No obvious rash, lesion, or ulcer.       Physical Exam LABORATORY PANEL:   CBC Recent Labs  Lab 08/15/17 0259  WBC 3.2*  HGB 9.9*  HCT 32.1*  PLT 103*    ------------------------------------------------------------------------------------------------------------------  Chemistries  Recent Labs  Lab 08/14/17 0324 08/15/17 0259  NA 142 144  K 4.2 3.5  CL 111 113*  CO2 19* 23  GLUCOSE 136* 122*  BUN 7 7  CREATININE 0.74 0.92  CALCIUM 9.3 8.4*  AST 51*  --   ALT 20  --   ALKPHOS 122  --   BILITOT 0.9  --    ------------------------------------------------------------------------------------------------------------------  Cardiac Enzymes No results for input(s): TROPONINI in the last 168 hours. ------------------------------------------------------------------------------------------------------------------  RADIOLOGY:  No results found.  ASSESSMENT AND PLAN:   Active Problems:   GI bleed   Rectal bleeding  * GI bleed   She has history of alcoholic liver cirrhosis and esophageal verises.   Likely this may be due to that.   IV octreotide and pantoprazole.   IV fluids and keep nothing by mouth.   Follow hemoglobin later today again.   GI consult for further management   Currently hemoglobin is stable and so does not require transfusion.   GI has ordered iron studies, they look fine. Plan for flexible sigmoidoscopy today. No more bleeding episodes today.  * Tachycardia   This may be secondary to GI bleed, blood pressure is running borderline low was so I will hold antihypertensive medication give some IV fluid.  * Diabetes   As I'm keeping nothing by mouth, will give insulin sliding scale coverage but will hold oral antidiabetic medication.  * Hypertension   Hold oral medication as blood pressure running soft.  * Chronic diastolic congestive heart failure   Hold medications for now due to soft blood pressure.  * Depression   On multiple antidepressant medications at home, will continue some.  * Chronic pancreatitis   She takes pancreatic lipase with meals, currently we'll hold because of keeping nothing  by mouth.  * Chronic alcoholism   Counseled to quit drinking due to her recurrent lever and GI bleeding issues.  * Seizure disorder   Continue home medications for now.  * History of stroke   Not a candidate for antiplatelet because of recurrent GI bleeding and verices.  * Active smoking  Counseled to quit smoking for 4 minutes and offered nicotine patch.    All the records are reviewed and case discussed with Care Management/Social Workerr. Management plans discussed with the patient, family and they are in agreement.  CODE STATUS: full  TOTAL TIME TAKING CARE OF THIS PATIENT: 35 minutes.     POSSIBLE D/C IN 1-2 DAYS, DEPENDING ON CLINICAL CONDITION.   Vaughan Basta M.D on 08/15/2017   Between 7am to 6pm - Pager - [phone]  After 6pm go to [email or web address] - password [email or web address] [facility]  Office  [phone]  CC: Primary care physician; System, Pcp Not In  Note: This dictation was prepared with Dragon dictation along with smaller phrase technology. Any transcriptional errors that result from this process are unintentional.

## 2017-08-15 NOTE — Progress Notes (Addendum)
[name], MD [address]  [place]  [place], [place] [address]  Main: [phone]  Fax: [phone] Pager: [phone]   Subjective: Unable to perform flexible sigmoidoscopy today as she is requesting anesthesia. Patient reports having ongoing rectal bleeding. Hemoglobin dropped however, WBC and platelets also dropped most likely hemodilution. patient is asking for pain medication  Objective: Vital signs in last 24 hours: Vitals:   08/14/17 1706 08/14/17 1920 08/15/17 1212 08/15/17 1624  BP: 92/61 110/69 112/72 (!) 104/49  Pulse: 90 86 84 87  Resp:  18 18 20   Temp: 98.3 F (36.8 C) 97.6 F (36.4 C) 98.2 F (36.8 C) 97.8 F (36.6 C)  TempSrc: Oral Oral Oral Tympanic  SpO2: 97% 97% 98% 98%  Weight:      Height:       Weight change:   Intake/Output Summary (Last 24 hours) at 08/15/2017 1852 Last data filed at 08/15/2017 1500 Gross per 24 hour  Intake 2585 ml  Output -  Net 2585 ml     Exam: Heart:: Regular rate and rhythm or S1S2 present Lungs: clear to auscultation Abdomen: soft, nontender, normal bowel sounds   Lab Results: CBC Latest Ref Rng & Units 08/15/2017 08/14/2017 08/14/2017  WBC 3.6 - 11.0 K/uL 3.2(L) - -  Hemoglobin 12.0 - 16.0 g/dL 9.9(L) 10.8(L) 10.2(L)  Hematocrit 35.0 - 47.0 % 32.1(L) - 32.2(L)  Platelets 150 - 440 K/uL 103(L) - -   BMP Latest Ref Rng & Units 08/15/2017 08/14/2017 07/16/2017  Glucose 65 - 99 mg/dL 122(H) 136(H) 80  BUN 6 - 20 mg/dL 7 7 5(L)  Creatinine 0.44 - 1.00 mg/dL 0.92 0.74 0.85  Sodium 135 - 145 mmol/L 144 142 139  Potassium 3.5 - 5.1 mmol/L 3.5 4.2 3.8  Chloride 101 - 111 mmol/L 113(H) 111 111  CO2 22 - 32 mmol/L 23 19(L) 22  Calcium 8.9 - 10.3 mg/dL 8.4(L) 9.3 8.3(L)   Micro Results: Recent Results (from the past 240 hour(s))  MRSA PCR Screening     Status: None   Collection Time: 08/14/17  9:40 PM  Result Value Ref Range Status   MRSA by PCR NEGATIVE NEGATIVE Final    Comment:        The GeneXpert MRSA  Assay (FDA approved for NASAL specimens only), is one component of a comprehensive MRSA colonization surveillance program. It is not intended to diagnose MRSA infection nor to guide or monitor treatment for MRSA infections. Performed at [facility], [address], [place], [place] [address]    Studies/Results: No results found. Medications: I have reviewed the patient's current medications. Scheduled Meds: . [MAR Hold] amitriptyline  50 mg Oral QHS  . [MAR Hold] busPIRone  10 mg Oral Daily  . [MAR Hold] citalopram  20 mg Oral Daily  . [MAR Hold] gabapentin  600 mg Oral QID  . [MAR Hold] insulin aspart  0-9 Units Subcutaneous TID WC  . [MAR Hold] levETIRAcetam  500 mg Oral BID  . [MAR Hold] lipase/protease/amylase  24,000 Units Oral TID WC  . [MAR Hold] mometasone-formoterol  2 puff Inhalation BID  . [MAR Hold] pantoprazole (PROTONIX) IV  40 mg Intravenous Q12H  . [MAR Hold] phenytoin  300 mg Oral BID  . [MAR Hold] pravastatin  10 mg Oral Daily  . [MAR Hold] sodium chloride flush  10-40 mL Intracatheter Q12H  . [MAR Hold] tiotropium  18 mcg Inhalation q morning - 10a   Continuous Infusions: . sodium chloride 75  mL/hr at 08/15/17 0136  . sodium chloride 1,000 mL (08/15/17 1628)  . octreotide  (SANDOSTATIN)    IV infusion 50 mcg/hr (08/15/17 1148)   PRN Meds:.[MAR Hold] albuterol, [MAR Hold] ALPRAZolam, [MAR Hold] diphenhydrAMINE, [MAR Hold] docusate sodium, [MAR Hold] ipratropium-albuterol, [MAR Hold] oxyCODONE, [MAR Hold] sodium chloride flush, [MAR Hold] zolpidem   Assessment: Active Problems:   GI bleed   Rectal bleeding  [name] is a 62 y.o. 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 She is now readmitted with another episode of rectal bleeding, hemodynamically insignificant  bleed. Hemoglobin is at baseline. This is consistent with outlet bleeding.   Plan: - Plan for flexible sigmoidoscopy tomorrow for possible APC of portal colopathy - Clear liquid diet - Nothing by mouth past midnight - Tapwater enema in the morning - Administer IV iron as her iron levels are low normal in the setting of anemia - Apparently, patient has pain seeking behavior according to her family members. She also requested me to prescribe pain medication as she does not have a primary care doctor as outpatient.  - Establish care with primary care after discharge  Dr. Alice Reichert to cover for the weekend   LOS: 0 days   Rohini Vanga 08/15/2017, 6:52 PM

## 2017-08-15 NOTE — Plan of Care (Signed)
Patient IV patent. Patient successfully cleaned out for procedure.  RN assessment and VS revealed stability. Consents x2  Completed. All personal items removed. NPO since midnight and only 2 pain pills with small sip of water given today.

## 2017-08-15 NOTE — Plan of Care (Signed)
Tap water enema given at 0930 and patient has had multiple trips to [facility]. No blood noted. Stool currently brown liquid with minimal substance.

## 2017-08-16 ENCOUNTER — Observation Stay: Payer: Medicare Other | Admitting: Anesthesiology

## 2017-08-16 ENCOUNTER — Encounter: Payer: Self-pay | Admitting: Emergency Medicine

## 2017-08-16 ENCOUNTER — Encounter
Admission: EM | Disposition: A | Discharge status: Home / Self Care | Payer: Self-pay | Source: Home / Self Care | Attending: Emergency Medicine

## 2017-08-16 DIAGNOSIS — K921 Melena: Secondary | ICD-10-CM | POA: Diagnosis not present

## 2017-08-16 HISTORY — PX: FLEXIBLE SIGMOIDOSCOPY: SHX5431

## 2017-08-16 LAB — TYPE AND SCREEN
ABO/RH(D): A POS
Antibody Screen: POSITIVE
UNIT DIVISION: 0
UNIT DIVISION: 0

## 2017-08-16 LAB — GLUCOSE, CAPILLARY
GLUCOSE-CAPILLARY: 105 mg/dL — AB (ref 65–99)
GLUCOSE-CAPILLARY: 132 mg/dL — AB (ref 65–99)
GLUCOSE-CAPILLARY: 115 mg/dL — AB (ref 65–99)
Glucose-Capillary: 103 mg/dL — ABNORMAL HIGH (ref 65–99)

## 2017-08-16 LAB — BPAM RBC
Blood Product Expiration Date: 201902202359
Blood Product Expiration Date: 201903012359
Unit Type and Rh: 5100
Unit Type and Rh: 5100

## 2017-08-16 SURGERY — SIGMOIDOSCOPY, FLEXIBLE

## 2017-08-16 SURGERY — SIGMOIDOSCOPY, FLEXIBLE
Anesthesia: General

## 2017-08-16 MED ORDER — PROPOFOL 500 MG/50ML IV EMUL
INTRAVENOUS | Status: DC | PRN
  Administered 2017-08-16: 140 ug/kg/min via INTRAVENOUS

## 2017-08-16 MED ORDER — PROPOFOL 10 MG/ML IV BOLUS
INTRAVENOUS | Status: DC | PRN
  Administered 2017-08-16: 70 mg via INTRAVENOUS

## 2017-08-16 NOTE — Anesthesia Post-op Follow-up Note (Signed)
Anesthesia QCDR form completed.        

## 2017-08-16 NOTE — Anesthesia Postprocedure Evaluation (Signed)
Anesthesia Post Note  Patient: [name]  Procedure(s) Performed: FLEXIBLE SIGMOIDOSCOPY (N/A )  Patient location during evaluation: Endoscopy Anesthesia Type: General Level of consciousness: awake and alert Pain management: pain level controlled Vital Signs Assessment: post-procedure vital signs reviewed and stable Respiratory status: spontaneous breathing, nonlabored ventilation, respiratory function stable and patient connected to nasal cannula oxygen Cardiovascular status: blood pressure returned to baseline and stable Postop Assessment: no apparent nausea or vomiting Anesthetic complications: no     Last Vitals:  Vitals:   08/16/17 1129 08/16/17 1149  BP: (!) 105/55 106/70  Pulse: 82   Resp: 14   Temp: (!) 36.1 C   SpO2: 95%     Last Pain:  Vitals:   08/16/17 1129  TempSrc: Oral  PainSc:                  Precious Haws Piscitello

## 2017-08-16 NOTE — Transfer of Care (Signed)
Immediate Anesthesia Transfer of Care Note  Patient: [name]  Procedure(s) Performed: FLEXIBLE SIGMOIDOSCOPY (N/A )  Patient Location: PACU  Anesthesia Type:General  Level of Consciousness: awake, alert  and oriented  Airway & Oxygen Therapy: Patient Spontanous Breathing and Patient connected to nasal cannula oxygen  Post-op Assessment: Report given to RN and Post -op Vital signs reviewed and stable  Post vital signs: Reviewed and stable  Last Vitals:  Vitals:   08/16/17 1111 08/16/17 1126  BP: 120/70 (!) 105/55  Pulse: 84 83  Resp: 16 16  Temp:    SpO2: 99% 93%    Last Pain:  Vitals:   08/16/17 0856  TempSrc:   PainSc: 8          Complications: No apparent anesthesia complications

## 2017-08-16 NOTE — Progress Notes (Signed)
PT Cancellation Note  Patient Details Name: [name] MRN: [id number] DOB: 10/08/1955   Cancelled Treatment:    Reason Eval/Treat Not Completed: Other (comment).  Pt stated she wanted to sleep instead.  Will try later as time allows.   Ramond Dial 08/16/2017, 10:41 AM  Mee Hives, PT MS Acute Rehab Dept. Number: [place] [place]

## 2017-08-16 NOTE — Interval H&P Note (Signed)
History and Physical Interval Note:  08/16/2017 11:10 AM  [name]  has presented today for surgery, with the diagnosis of Rectal bleeding  The various methods of treatment have been discussed with the patient and family. After consideration of risks, benefits and other options for treatment, the patient has consented to  Procedure(s): FLEXIBLE SIGMOIDOSCOPY (N/A) as a surgical intervention .  The patient's history has been reviewed, patient examined, no change in status, stable for surgery.  I have reviewed the patient's chart and labs.  Questions were answered to the patient's satisfaction.     [place], [place]

## 2017-08-16 NOTE — Anesthesia Preprocedure Evaluation (Signed)
Anesthesia Evaluation  Patient identified by MRN, date of birth, ID band Patient awake    Reviewed: Allergy & Precautions, H&P , NPO status , Patient's Chart, lab work & pertinent test results  History of Anesthesia Complications Negative for: history of anesthetic complications  Airway Mallampati: III  TM Distance: <3 FB Neck ROM: limited    Dental  (+) Missing, Poor Dentition   Pulmonary neg shortness of breath, COPD,  COPD inhaler, Current Smoker,           Cardiovascular Exercise Tolerance: Poor hypertension, (-) angina+CHF  (-) Past MI      Neuro/Psych  Headaches, Seizures -,  PSYCHIATRIC DISORDERS Anxiety Depression Schizophrenia CVA, Residual Symptoms negative psych ROS   GI/Hepatic GERD  Medicated and Controlled,(+) Hepatitis -, C  Endo/Other  diabetes, Type 2  Renal/GU Renal disease  negative genitourinary   Musculoskeletal  (+) Arthritis ,   Abdominal   Peds  Hematology negative hematology ROS (+)   Anesthesia Other Findings Past Medical History: No date: Allergy No date: Anemia No date: Anxiety No date: CHF (congestive heart failure) (HCC) No date: Chronic bronchitis (HCC) No date: Chronic kidney disease No date: Cirrhosis ([place]) No date: Depression No date: Diabetes mellitus without complication (HCC) No date: Diverticulitis No date: Emphysema of lung ([place]) No date: GERD (gastroesophageal reflux disease) No date: H/O drug abuse     Comment:  Clean since 2009 No date: H/O ETOH abuse     Comment:  Sober since 2009 No date: Hepatitis C 2016: History of sepsis No date: Hypertension 2007: Malignant brain tumor ([place]) No date: Migraines No date: Pancreatic ascites 2013: Pancreatitis, alcoholic 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 No date: Seizures [facility])     Comment:  Last seizure October 2017 No date: Spleen enlarged 2007, 2008: Stroke [facility])     Comment:  during brain surgery No date: Urine  incontinence  Past Surgical History: 1979: ABDOMINAL HYSTERECTOMY 1999: APPENDECTOMY 2007: BRAIN SURGERY     Comment:  malignant brain tumor 2001: CHOLECYSTECTOMY 2006: COLON SURGERY 07/17/2017: COLONOSCOPY; N/A     Comment:  Procedure: COLONOSCOPY;  Surgeon: Lin Landsman,               MD;  Location: [place];  Service:               Gastroenterology;  Laterality: N/A; 09/15/2015: CRANIOPLASTY; N/A     Comment:  Procedure: Cranioplasty with retrieval of bone flap from              abdominal pocket;  Surgeon: Ashok Pall, MD;  Location:               [place] NEURO ORS;  Service: Neurosurgery;  Laterality: N/A;                Cranioplasty with retrieval of bone flap from abdominal               pocket 08/16/2015: CRANIOTOMY; Right     Comment:  Procedure: Right Fronto-Temporal-Parietal Craniotomy for              Evacuation of Hematoma with Bone Flap Placement in               Abdomen;  Surgeon: Ashok Pall, MD;  Location: [place] NEURO               ORS;  Service: Neurosurgery;  Laterality: Right; 12/28/2015: CRANIOTOMY; N/A     Comment:  Procedure: Craniectomy for wound debridement;  Surgeon:  Ashok Pall, MD;  Location: [place] NEURO ORS;  Service:               Neurosurgery;  Laterality: N/A;  Craniectomy for wound               debridement No date: DILATION AND CURETTAGE OF UTERUS 07/17/2017: ESOPHAGOGASTRODUODENOSCOPY; N/A     Comment:  Procedure: ESOPHAGOGASTRODUODENOSCOPY (EGD);  Surgeon:               Lin Landsman, MD;  Location: [facility] ENDOSCOPY;                Service: Gastroenterology;  Laterality: N/A; No date: EYE SURGERY No date: JOINT REPLACEMENT; Left     Comment:  TOTAL KNEE REPLACEMENT 2011: rectovaginal fistula repair w/ colostomy 2005: REPLACEMENT TOTAL KNEE; Left  BMI    Body Mass Index:  42.57 kg/m      Reproductive/Obstetrics negative OB ROS                             Anesthesia Physical Anesthesia  Plan  ASA: III  Anesthesia Plan: General   Post-op Pain Management:    Induction: Intravenous  PONV Risk Score and Plan: Propofol infusion and TIVA  Airway Management Planned: Natural Airway and Nasal Cannula  Additional Equipment:   Intra-op Plan:   Post-operative Plan:   Informed Consent: I have reviewed the patients History and Physical, chart, labs and discussed the procedure including the risks, benefits and alternatives for the proposed anesthesia with the patient or authorized representative who has indicated his/her understanding and acceptance.   Dental Advisory Given  Plan Discussed with: Anesthesiologist, CRNA and Surgeon  Anesthesia Plan Comments: (Patient consented for risks of anesthesia including but not limited to:  - adverse reactions to medications - risk of intubation if required - damage to teeth, lips or other oral mucosa - sore throat or hoarseness - Damage to heart, brain, lungs or loss of life  Patient voiced understanding.)        Anesthesia Quick Evaluation

## 2017-08-16 NOTE — Op Note (Signed)
[facility] Gastroenterology Patient Name: [name] Procedure Date: 08/16/2017 9:29 AM MRN: [id number] Account #: [id number] Date of Birth: 11-May-1956 Admit Type: Inpatient Age: 62 Room: [facility] ENDO ROOM 4 Gender: Female Note Status: Finalized Procedure:            Flexible Sigmoidoscopy Indications:          Hematochezia Providers:            Benay Pike. Jayona Mccaig MD, MD Medicines:            None, Propofol per Anesthesia Complications:        No immediate complications. Procedure:            Pre-Anesthesia Assessment:                       - The risks and benefits of the procedure and the                        sedation options and risks were discussed with the                        patient. All questions were answered and informed                        consent was obtained.                       - Patient identification and proposed procedure were                        verified prior to the procedure by the nurse. The                        procedure was verified in the pre-procedure area.                       - ASA Grade Assessment: III - A patient with severe                        systemic disease.                       - After reviewing the risks and benefits, the patient                        was deemed in satisfactory condition to undergo the                        procedure.                       After obtaining informed consent, the scope was passed                        under direct vision. The Colonoscope was introduced                        through the anus and advanced to the the sigmoid colon.                        The flexible sigmoidoscopy was accomplished without  difficulty. The patient tolerated the procedure well.                        The quality of the bowel preparation was fair. Findings:      The perianal and digital rectal examinations were normal. Pertinent       negatives include normal sphincter tone and  no palpable rectal lesions.      A patchy area of mildly friable mucosa with contact bleeding was found       in the sigmoid colon.      The exam was otherwise without abnormality. Impression:           - Preparation of the colon was fair.                       - Friability with contact bleeding in the sigmoid colon.                       - The examination was otherwise normal.                       - No specimens collected. Recommendation:       - - Observe the patient for 1 day.                       - Advance diet as tolerated today.                       - See inpatient chart and orders for further                        recommendations Procedure Code(s):    --- Professional ---                       [id number], Sigmoidoscopy, flexible; diagnostic, including                        collection of specimen(s) by brushing or washing, when                        performed (separate procedure) Diagnosis Code(s):    --- Professional ---                       K92.1, Melena (includes Hematochezia)                       K92.2, Gastrointestinal hemorrhage, unspecified CPT copyright 2016 American Medical Association. All rights reserved. The codes documented in this report are preliminary and upon coder review may  be revised to meet current compliance requirements. Efrain Sella MD, MD 08/16/2017 11:33:41 AM This report has been signed electronically. Number of Addenda: 0 Note Initiated On: 08/16/2017 9:29 AM Total Procedure Duration: 0 hours 6 minutes 37 seconds       [facility]

## 2017-08-16 NOTE — Progress Notes (Signed)
[facility] [place] [place] NAME: [name]    MR#:  [id number]  DATE OF BIRTH:  [birth year]  SUBJECTIVE:  CHIEF COMPLAINT:   Chief Complaint  Patient presents with  . Rectal Bleeding   -Pretty sleepy this morning after flexible sigmoidoscopy. No further rectal bleeding noted. -Denies any complaints at this point  REVIEW OF SYSTEMS:  Review of Systems  Constitutional: Positive for malaise/fatigue. Negative for chills and fever.  Respiratory: Negative for cough, shortness of breath and wheezing.   Cardiovascular: Negative for chest pain and palpitations.  Gastrointestinal: Positive for blood in stool. Negative for abdominal pain, constipation, diarrhea, nausea and vomiting.  Genitourinary: Negative for dysuria.  Neurological: Negative for dizziness, seizures and headaches.    DRUG ALLERGIES:   Allergies  Allergen Reactions  . Ibuprofen Other (See Comments)    Pt states that she is unable to take this medication because of her liver.    . Sulfa Antibiotics Hives  . Hydroxyzine Other (See Comments)    Causes crying   . Amoxicillin Itching, Rash and Other (See Comments)    Has patient had a PCN reaction causing immediate rash, facial/tongue/throat swelling, SOB or lightheadedness with hypotension: No Has patient had a PCN reaction causing severe rash involving mucus membranes or skin necrosis: No Has patient had a PCN reaction that required hospitalization No Has patient had a PCN reaction occurring within the last 10 years: Yes If all of the above answers are "NO", then may proceed with Cephalosporin use.  . Chocolate Rash  . Chocolate Flavor Rash  . Ciprofloxacin Itching and Rash  . Dilaudid [Hydromorphone Hcl] Itching  . Fentanyl Rash  . Hydromorphone Itching  . Hydromorphone Hcl Itching  . Metformin Nausea And Vomiting  . Penicillins Itching, Rash and Other (See Comments)    Has patient had a PCN reaction causing immediate  rash, facial/tongue/throat swelling, SOB or lightheadedness with hypotension: No Has patient had a PCN reaction causing severe rash involving mucus membranes or skin necrosis: No Has patient had a PCN reaction that required hospitalization No Has patient had a PCN reaction occurring within the last 10 years: Yes If all of the above answers are "NO", then may proceed with Cephalosporin use.  . Strawberry Extract Rash    VITALS:  Blood pressure 115/70, pulse 80, temperature 97.7 F (36.5 C), temperature source Axillary, resp. rate 18, height 5\' 8"  (1.727 m), weight 127 kg (280 lb), SpO2 90 %.  PHYSICAL EXAMINATION:  Physical Exam  GENERAL:  62 y.o.-year-old obese patient lying in the bed with no acute distress.  EYES: Pupils equal, round, reactive to light and accommodation. No scleral icterus. Extraocular muscles intact.  HEENT: Head atraumatic, normocephalic. Oropharynx and nasopharynx clear.  NECK:  Supple, no jugular venous distention. No thyroid enlargement, no tenderness.  LUNGS: Normal breath sounds bilaterally, no wheezing, rales,rhonchi or crepitation. No use of accessory muscles of respiration.  CARDIOVASCULAR: S1, S2 normal. No murmurs, rubs, or gallops.  ABDOMEN: Soft, nontender, nondistended. Bowel sounds present. No organomegaly or mass.  EXTREMITIES: No pedal edema, cyanosis, or clubbing.  NEUROLOGIC: Sleepy this morning. Following some simple commands PSYCHIATRIC: The patient is sleepy, arousable and oriented to self.  SKIN: No obvious rash, lesion, or ulcer.    LABORATORY PANEL:   CBC Recent Labs  Lab 08/15/17 0259  WBC 3.2*  HGB 9.9*  HCT 32.1*  PLT 103*   ------------------------------------------------------------------------------------------------------------------  Chemistries  Recent Labs  Lab 08/14/17 0324 08/15/17  0259  NA 142 144  K 4.2 3.5  CL 111 113*  CO2 19* 23  GLUCOSE 136* 122*  BUN 7 7  CREATININE 0.74 0.92  CALCIUM 9.3 8.4*  AST  51*  --   ALT 20  --   ALKPHOS 122  --   BILITOT 0.9  --    ------------------------------------------------------------------------------------------------------------------  Cardiac Enzymes No results for input(s): TROPONINI in the last 168 hours. ------------------------------------------------------------------------------------------------------------------  RADIOLOGY:  No results found.  EKG:   Orders placed or performed during the hospital encounter of 08/14/17  . EKG 12-Lead  . EKG 12-Lead    ASSESSMENT AND PLAN:   62 year old female with past medical history significant for CHF, CK D, liver cirrhosis, history of hepatitis C, history of alcohol abuse, COPD, hypertension, seizures came to the hospital secondary to rectal bleed  1. Rectal bleed-appreciate GI consult. -Recent EGD and colonoscopy done last month-showing small esophageal varices, post portal gastropathy and also areas of erythema and contact bleeding and rectum consistent with portal colopathy. -Did not receive any transfusions this admission. Slight drop in hemoglobin noted. -Appreciate GI consult. Patient had flexible sigmoidoscopy showing mildly friable mucosa with contact bleeding in sigmoid colon on IV Protonix and also octreotide.  2. COPD-stable. Continue home medications and inhalers.  3. Seizure disorder on Keppra and Dilantin  4. Depression and anxiety-continue Celexa and BuSpar, also on Elavil and Xanax.  5. Neuropathy-gabapentin  6. DVT prophylaxis-on teds and SCDs only due to GI bleed  Encourage ambulation and anticipate discharge tomorrow.     All the records are reviewed and case discussed with Care Management/Social Workerr. Management plans discussed with the patient, family and they are in agreement.  CODE STATUS: Full code  TOTAL TIME TAKING CARE OF THIS PATIENT: 37 minutes.   POSSIBLE D/C IN 1-2 DAYS, DEPENDING ON CLINICAL CONDITION.   [name] M.D on 08/16/2017 at  2:51 PM  Between 7am to 6pm - Pager - [phone]  After 6pm go to [email or web address] - password [email or web address] [facility]  Office  [phone]  CC: Primary care physician; System, Pcp Not In

## 2017-08-17 ENCOUNTER — Encounter: Payer: Self-pay | Admitting: Internal Medicine

## 2017-08-17 DIAGNOSIS — K921 Melena: Secondary | ICD-10-CM | POA: Diagnosis not present

## 2017-08-17 LAB — CBC
HCT: 33.9 % — ABNORMAL LOW (ref 35.0–47.0)
Hemoglobin: 10.7 g/dL — ABNORMAL LOW (ref 12.0–16.0)
MCH: 27.3 pg (ref 26.0–34.0)
MCHC: 31.6 g/dL — AB (ref 32.0–36.0)
MCV: 86.5 fL (ref 80.0–100.0)
PLATELETS: 99 10*3/uL — AB (ref 150–440)
RBC: 3.92 MIL/uL (ref 3.80–5.20)
RDW: 16.5 % — ABNORMAL HIGH (ref 11.5–14.5)
WBC: 3.1 10*3/uL — ABNORMAL LOW (ref 3.6–11.0)

## 2017-08-17 LAB — BASIC METABOLIC PANEL
Anion gap: 8 (ref 5–15)
BUN: 6 mg/dL (ref 6–20)
CO2: 23 mmol/L (ref 22–32)
CREATININE: 0.84 mg/dL (ref 0.44–1.00)
Calcium: 8.6 mg/dL — ABNORMAL LOW (ref 8.9–10.3)
Chloride: 114 mmol/L — ABNORMAL HIGH (ref 101–111)
Glucose, Bld: 116 mg/dL — ABNORMAL HIGH (ref 65–99)
POTASSIUM: 3.6 mmol/L (ref 3.5–5.1)
SODIUM: 145 mmol/L (ref 135–145)

## 2017-08-17 LAB — GLUCOSE, CAPILLARY
GLUCOSE-CAPILLARY: 108 mg/dL — AB (ref 65–99)
Glucose-Capillary: 184 mg/dL — ABNORMAL HIGH (ref 65–99)

## 2017-08-17 MED ORDER — ZOLPIDEM TARTRATE 5 MG PO TABS: 5.0000 mg | ORAL_TABLET | Freq: Every evening | ORAL | 0 refills | Status: AC | PRN

## 2017-08-17 MED ORDER — ALPRAZOLAM 1 MG PO TABS: 1.0000 mg | ORAL_TABLET | Freq: Every evening | ORAL | 0 refills | Status: AC | PRN

## 2017-08-17 MED ORDER — PANTOPRAZOLE SODIUM 40 MG PO TBEC: 40.0000 mg | DELAYED_RELEASE_TABLET | Freq: Every day | ORAL | 2 refills | Status: AC

## 2017-08-17 MED ORDER — SPIRONOLACTONE 25 MG PO TABS: 25.0000 mg | ORAL_TABLET | Freq: Every day | ORAL | 11 refills | Status: AC

## 2017-08-17 MED ORDER — TRAMADOL HCL 50 MG PO TABS: 50.0000 mg | ORAL_TABLET | Freq: Four times a day (QID) | ORAL | 0 refills | Status: AC | PRN

## 2017-08-17 NOTE — Progress Notes (Addendum)
Patients vitals stable post fall no distress. Patient was discharged home assisted by myself and belinda. Patient escorted via wheelchair to private vehicle. Patient able to get into car with minimal assist.

## 2017-08-17 NOTE — Discharge Summary (Signed)
[facility] [place] NAME: [name]    MR#:  [id number]  DATE OF BIRTH:  1955-09-01  DATE OF ADMISSION:  08/14/2017   ADMITTING PHYSICIAN: Vaughan Basta, MD  DATE OF DISCHARGE: 08/17/2017  PRIMARY CARE PHYSICIAN: System, Pcp Not In   ADMISSION DIAGNOSIS:   Acute GI bleeding [K92.2] Lower GI bleed [K92.2]  DISCHARGE DIAGNOSIS:   Active Problems:   GI bleed   Rectal bleeding   SECONDARY DIAGNOSIS:   Past Medical History:  Diagnosis Date  . Allergy   . Anemia   . Anxiety   . CHF (congestive heart failure) ([place])   . Chronic bronchitis ([place])   . Chronic kidney disease   . Cirrhosis ([place])   . Depression   . Diabetes mellitus without complication ([place])   . Diverticulitis   . Emphysema of lung ([place])   . GERD (gastroesophageal reflux disease)   . H/O drug abuse    Clean since 2009  . H/O ETOH abuse    Sober since 2009  . Hepatitis C   . History of sepsis 2016  . Hypertension   . Malignant brain tumor ([place]) 2007  . Migraines   . Pancreatic ascites   . Pancreatitis, alcoholic 6440  . Rectovaginal fistula   . Seizures [facility])    Last seizure October 2017  . Spleen enlarged   . Stroke [facility]) 2007, 2008   during brain surgery  . Urine incontinence     HOSPITAL COURSE:    62 year old female with past medical history significant for CHF, CK D, liver cirrhosis, history of hepatitis C, history of alcohol abuse, COPD, hypertension, seizures came to the hospital secondary to rectal bleed  1. Rectal bleed-appreciate GI consult. -Recent EGD and colonoscopy done last month-showing small esophageal varices, post portal gastropathy and also areas of erythema and contact bleeding and rectum consistent with portal colopathy. -Did not receive any transfusions this admission. Slight drop in hemoglobin noted. -Appreciate GI consult. Patient had flexible sigmoidoscopy showing mildly friable mucosa with contact bleeding in  sigmoid colon -Was on IV Protonix and octreotide in the hospital.  2. COPD-stable. Continue home medications and inhalers.  3. Seizure disorder on Keppra and Dilantin  4. Depression and anxiety-continue Celexa and BuSpar, also on Elavil and Xanax.  5. Neuropathy-gabapentin  6. Weakness-patient mostly laying in bed in the hospital. Refusing to work with physical therapy. Requesting to refill her oxycodone, Xanax, Ambien at discharge. Only give tramadol prescription and few days of standard neck syndrome Ambien. Did not refill her oxycodone.  7. Liver cirrhosis-known history of hepatitis C and cirrhosis has been started on Lasix and Aldactone at discharge. According to mother, patient continues to smoke and drink beer at home   Recommended patient to work with physical therapy to see if she needs to go to rehabilitation. However patient has been declining physical therapy for the last couple of days. Discussed with mother, will discharge home with home health. -High risk for readmission.     DISCHARGE CONDITIONS:   Guarded  CONSULTS OBTAINED:   Treatment Team:  Lin Landsman, MD  DRUG ALLERGIES:   Allergies  Allergen Reactions  . Ibuprofen Other (See Comments)    Pt states that she is unable to take this medication because of her liver.    . Sulfa Antibiotics Hives  . Hydroxyzine Other (See Comments)    Causes crying   . Amoxicillin Itching, Rash and Other (See Comments)  Has patient had a PCN reaction causing immediate rash, facial/tongue/throat swelling, SOB or lightheadedness with hypotension: No Has patient had a PCN reaction causing severe rash involving mucus membranes or skin necrosis: No Has patient had a PCN reaction that required hospitalization No Has patient had a PCN reaction occurring within the last 10 years: Yes If all of the above answers are "NO", then may proceed with Cephalosporin use.  . Chocolate Rash  . Chocolate Flavor Rash  .  Ciprofloxacin Itching and Rash  . Dilaudid [Hydromorphone Hcl] Itching  . Fentanyl Rash  . Hydromorphone Itching  . Hydromorphone Hcl Itching  . Metformin Nausea And Vomiting  . Penicillins Itching, Rash and Other (See Comments)    Has patient had a PCN reaction causing immediate rash, facial/tongue/throat swelling, SOB or lightheadedness with hypotension: No Has patient had a PCN reaction causing severe rash involving mucus membranes or skin necrosis: No Has patient had a PCN reaction that required hospitalization No Has patient had a PCN reaction occurring within the last 10 years: Yes If all of the above answers are "NO", then may proceed with Cephalosporin use.  . Strawberry Extract Rash   DISCHARGE MEDICATIONS:   Allergies as of 08/17/2017      Reactions   Ibuprofen Other (See Comments)   Pt states that she is unable to take this medication because of her liver.    Sulfa Antibiotics Hives   Hydroxyzine Other (See Comments)   Causes crying    Amoxicillin Itching, Rash, Other (See Comments)   Has patient had a PCN reaction causing immediate rash, facial/tongue/throat swelling, SOB or lightheadedness with hypotension: No Has patient had a PCN reaction causing severe rash involving mucus membranes or skin necrosis: No Has patient had a PCN reaction that required hospitalization No Has patient had a PCN reaction occurring within the last 10 years: Yes If all of the above answers are "NO", then may proceed with Cephalosporin use.   Chocolate Rash   Chocolate Flavor Rash   Ciprofloxacin Itching, Rash   Dilaudid [hydromorphone Hcl] Itching   Fentanyl Rash   Hydromorphone Itching   Hydromorphone Hcl Itching   Metformin Nausea And Vomiting   Penicillins Itching, Rash, Other (See Comments)   Has patient had a PCN reaction causing immediate rash, facial/tongue/throat swelling, SOB or lightheadedness with hypotension: No Has patient had a PCN reaction causing severe rash involving mucus  membranes or skin necrosis: No Has patient had a PCN reaction that required hospitalization No Has patient had a PCN reaction occurring within the last 10 years: Yes If all of the above answers are "NO", then may proceed with Cephalosporin use.   Strawberry Extract Rash      Medication List    STOP taking these medications   acetaminophen-codeine 300-30 MG tablet Commonly known as:  TYLENOL #3   AFLURIA QUADRIVALENT 0.5 ML injection Generic drug:  Influenza vac split quadrivalent PF   amLODipine 10 MG tablet Commonly known as:  NORVASC   azithromycin 500 MG tablet Commonly known as:  ZITHROMAX   cefUROXime 500 MG tablet Commonly known as:  CEFTIN   chlorhexidine 0.12 % solution Commonly known as:  PERIDEX   ciprofloxacin 500 MG tablet Commonly known as:  CIPRO   clindamycin 300 MG capsule Commonly known as:  CLEOCIN   clotrimazole-betamethasone cream Commonly known as:  LOTRISONE   Desvenlafaxine ER 50 MG Tb24 Commonly known as:  PRISTIQ   diazepam 10 MG tablet Commonly known as:  VALIUM   ENSURE  escitalopram 10 MG tablet Commonly known as:  LEXAPRO   fluconazole 150 MG tablet Commonly known as:  DIFLUCAN   hydrOXYzine 25 MG tablet Commonly known as:  ATARAX/VISTARIL   hydrOXYzine 50 MG capsule Commonly known as:  VISTARIL   Influenza vac split quadrivalent PF 0.5 ML injection Commonly known as:  FLUARIX   levofloxacin 500 MG tablet Commonly known as:  LEVAQUIN   meloxicam 7.5 MG tablet Commonly known as:  MOBIC   metFORMIN 1000 MG tablet Commonly known as:  GLUCOPHAGE   metoCLOPramide 10 MG tablet Commonly known as:  REGLAN   metoprolol succinate 25 MG 24 hr tablet Commonly known as:  TOPROL-XL   nitrofurantoin 100 MG capsule Commonly known as:  MACRODANTIN   omeprazole 20 MG capsule Commonly known as:  PRILOSEC   ondansetron 4 MG tablet Commonly known as:  ZOFRAN   ONGLYZA 5 MG Tabs tablet Generic drug:  saxagliptin HCl     oxyCODONE-acetaminophen 10-325 MG tablet Commonly known as:  PERCOCET   phentermine 37.5 MG tablet Commonly known as:  ADIPEX-P   PNEUMOVAX 23 25 MCG/0.5ML injection Generic drug:  pneumococcal 23 valent vaccine   Polyethylene Glycol 3350 Gran   potassium chloride 10 MEQ tablet Commonly known as:  K-DUR   potassium chloride 10 MEQ tablet Commonly known as:  K-DUR,KLOR-CON   predniSONE 10 MG tablet Commonly known as:  DELTASONE   prochlorperazine 10 MG tablet Commonly known as:  COMPAZINE   sertraline 100 MG tablet Commonly known as:  ZOLOFT   SHINGRIX injection Generic drug:  Zoster Vaccine Adjuvanted   sucralfate 1 g tablet Commonly known as:  CARAFATE   temazepam 30 MG capsule Commonly known as:  RESTORIL   terconazole 0.4 % vaginal cream Commonly known as:  TERAZOL 7   terconazole 80 MG vaginal suppository Commonly known as:  TERAZOL 3   triamcinolone 55 MCG/ACT Aero nasal inhaler Commonly known as:  NASACORT   triamcinolone cream 0.1 % Commonly known as:  KENALOG   Venlafaxine HCl 150 MG Tb24   venlafaxine XR 75 MG 24 hr capsule Commonly known as:  EFFEXOR-XR     TAKE these medications   albuterol 108 (90 Base) MCG/ACT inhaler Commonly known as:  PROVENTIL HFA;VENTOLIN HFA Inhale 2 puffs into the lungs every 6 (six) hours as needed for wheezing or shortness of breath. What changed:  Another medication with the same name was removed. Continue taking this medication, and follow the directions you see here.   albuterol (5 MG/ML) 0.5% nebulizer solution Commonly known as:  PROVENTIL Take 2.5 mg by nebulization every 6 (six) hours as needed for wheezing or shortness of breath. What changed:  Another medication with the same name was removed. Continue taking this medication, and follow the directions you see here.   ALPRAZolam 1 MG tablet Commonly known as:  XANAX Take 1 tablet (1 mg total) by mouth at bedtime as needed.   amitriptyline 50 MG  tablet Commonly known as:  ELAVIL Take 50 mg by mouth at bedtime.   busPIRone 10 MG tablet Commonly known as:  BUSPAR Take 10 mg by mouth daily.   citalopram 20 MG tablet Commonly known as:  CELEXA 1 tablet by mouth daily   diphenhydrAMINE 25 mg capsule Commonly known as:  BENADRYL Take 1 capsule (25 mg total) by mouth every 8 (eight) hours as needed for itching.   DULERA 200-5 MCG/ACT Aero Generic drug:  mometasone-formoterol Inhale 2 puffs into the lungs 2 (two) times daily.  FIFTY50 GLUCOSE METER 2.0 w/Device Kit ICD-10 E11.9 Check blood sugars once daily   Fluticasone-Salmeterol 250-50 MCG/DOSE Aepb Commonly known as:  ADVAIR Inhale 1 puff into the lungs 2 (two) times daily. What changed:  Another medication with the same name was removed. Continue taking this medication, and follow the directions you see here.   furosemide 20 MG tablet Commonly known as:  LASIX Take 20 mg by mouth every morning.   gabapentin 300 MG capsule Commonly known as:  NEURONTIN Take 600 mg by mouth 4 (four) times daily.   Ipratropium-Albuterol 20-100 MCG/ACT Aers respimat Commonly known as:  COMBIVENT Inhale 2 puffs into the lungs every 6 (six) hours as needed for wheezing or shortness of breath.   lactulose 10 GM/15ML solution Commonly known as:  CHRONULAC lactulose 10 gram/15 mL oral solution   levETIRAcetam 500 MG tablet Commonly known as:  KEPPRA Take 500 mg by mouth 2 (two) times daily.   lipase/protease/amylase 12000 units Cpep capsule Commonly known as:  CREON Take 24,000 Units by mouth 3 (three) times daily with meals.   MICROLET LANCETS Misc Microlet Lancet What changed:  Another medication with the same name was removed. Continue taking this medication, and follow the directions you see here.   nicotine 7 mg/24hr patch Commonly known as:  NICODERM CQ - dosed in mg/24 hr Place onto the skin.   Oxycodone HCl 20 MG Tabs Take 20 mg by mouth 3 (three) times daily as  needed (pain). What changed:  Another medication with the same name was removed. Continue taking this medication, and follow the directions you see here.   pantoprazole 40 MG tablet Commonly known as:  PROTONIX Take 1 tablet (40 mg total) by mouth daily.   phenytoin 100 MG/4ML suspension Commonly known as:  DILANTIN Take 300 mg by mouth 2 (two) times daily. Takes 86m by mouth bid   pravastatin 10 MG tablet Commonly known as:  PRAVACHOL Take 10 mg by mouth daily.   promethazine 25 MG tablet Commonly known as:  PHENERGAN Take 25 mg by mouth every 8 (eight) hours as needed for nausea or vomiting.   QUEtiapine 100 MG tablet Commonly known as:  SEROQUEL quetiapine 100 mg tablet   spironolactone 25 MG tablet Commonly known as:  ALDACTONE Take 1 tablet (25 mg total) by mouth daily. What changed:    medication strength  how much to take  when to take this   tiotropium 18 MCG inhalation capsule Commonly known as:  SPIRIVA Place 18 mcg into inhaler and inhale daily as needed (shortness of breath).   traMADol 50 MG tablet Commonly known as:  ULTRAM Take 1 tablet (50 mg total) by mouth every 6 (six) hours as needed for moderate pain.   VESICARE 10 MG tablet Generic drug:  solifenacin Vesicare 10 mg tablet   zolpidem 5 MG tablet Commonly known as:  AMBIEN Take 1 tablet (5 mg total) by mouth at bedtime as needed for sleep. What changed:    See the new instructions.  Another medication with the same name was removed. Continue taking this medication, and follow the directions you see here.        DISCHARGE INSTRUCTIONS:   1. PCP follow-up in 1-2 weeks  DIET:   Cardiac diet  ACTIVITY:   Activity as tolerated  OXYGEN:   Home Oxygen: No.  Oxygen Delivery: room air  DISCHARGE LOCATION:   home   If you experience worsening of your admission symptoms, develop shortness of breath, life  threatening emergency, suicidal or homicidal thoughts you must seek medical  attention immediately by calling 911 or calling your MD immediately  if symptoms less severe.  You Must read complete instructions/literature along with all the possible adverse reactions/side effects for all the Medicines you take and that have been prescribed to you. Take any new Medicines after you have completely understood and accpet all the possible adverse reactions/side effects.   Please note  You were cared for by a hospitalist during your hospital stay. If you have any questions about your discharge medications or the care you received while you were in the hospital after you are discharged, you can call the unit and asked to speak with the hospitalist on call if the hospitalist that took care of you is not available. Once you are discharged, your primary care physician will handle any further medical issues. Please note that NO REFILLS for any discharge medications will be authorized once you are discharged, as it is imperative that you return to your primary care physician (or establish a relationship with a primary care physician if you do not have one) for your aftercare needs so that they can reassess your need for medications and monitor your lab values.    On the day of Discharge:  VITAL SIGNS:   Blood pressure 137/81, pulse 80, temperature 98.9 F (37.2 C), temperature source Oral, resp. rate 18, height '5\' 8"'$  (1.727 m), weight 127 kg (280 lb), SpO2 94 %.  PHYSICAL EXAMINATION:    GENERAL:  62 y.o.-year-old obese patient lying in the bed with no acute distress.  EYES: Pupils equal, round, reactive to light and accommodation. No scleral icterus. Extraocular muscles intact.  HEENT: Head atraumatic, normocephalic. Oropharynx and nasopharynx clear.  NECK:  Supple, no jugular venous distention. No thyroid enlargement, no tenderness.  LUNGS: Normal breath sounds bilaterally, no wheezing, rales,rhonchi or crepitation. No use of accessory muscles of respiration. Decreased bibasilar  breath sounds. CARDIOVASCULAR: S1, S2 normal. No murmurs, rubs, or gallops.  ABDOMEN: Soft, non-tender, distended. Bowel sounds present. No organomegaly or mass.  EXTREMITIES: No pedal edema, cyanosis, or clubbing.  NEUROLOGIC: Cranial nerves II through XII are intact. Muscle strength 5/5 in all extremities. Sensation intact. Gait not checked. Global weakness noted PSYCHIATRIC: The patient is alert and oriented x 3.  SKIN: No obvious rash, lesion, or ulcer.   DATA REVIEW:   CBC Recent Labs  Lab 08/17/17 0323  WBC 3.1*  HGB 10.7*  HCT 33.9*  PLT 99*    Chemistries  Recent Labs  Lab 08/14/17 0324  08/17/17 0323  NA 142   < > 145  K 4.2   < > 3.6  CL 111   < > 114*  CO2 19*   < > 23  GLUCOSE 136*   < > 116*  BUN 7   < > 6  CREATININE 0.74   < > 0.84  CALCIUM 9.3   < > 8.6*  AST 51*  --   --   ALT 20  --   --   ALKPHOS 122  --   --   BILITOT 0.9  --   --    < > = values in this interval not displayed.     Microbiology Results  Results for orders placed or performed during the hospital encounter of 08/14/17  MRSA PCR Screening     Status: None   Collection Time: 08/14/17  9:40 PM  Result Value Ref Range Status   MRSA by PCR  NEGATIVE NEGATIVE Final    Comment:        The GeneXpert MRSA Assay (FDA approved for NASAL specimens only), is one component of a comprehensive MRSA colonization surveillance program. It is not intended to diagnose MRSA infection nor to guide or monitor treatment for MRSA infections. Performed at [facility], [address] [place], [place] [address]     RADIOLOGY:  No results found.   Management plans discussed with the patient, family and they are in agreement.  CODE STATUS:     Code Status Orders  (From admission, onward)        Start     Ordered   08/14/17 1012  Full code  Continuous     08/14/17 1011    Code Status History    Date Active Date Inactive Code Status Order ID Comments User Context   07/16/2017  01:40 07/17/2017 17:42 Full Code [id number]  Saundra Shelling, MD ED   07/14/2017 05:03 07/15/2017 15:16 Full Code [id number]  Saundra Shelling, MD Inpatient   02/24/2016 20:03 02/26/2016 17:20 Full Code [id number]  Idelle Crouch, MD Inpatient   12/26/2015 13:00 01/05/2016 21:42 Full Code [id number]  Ashok Pall, MD Inpatient   10/22/2015 04:26 10/25/2015 18:52 Full Code [id number]  Saundra Shelling, MD ED   08/16/2015 21:36 08/30/2015 18:11 Full Code [id number]  Ashok Pall, MD Inpatient   06/22/2015 01:45 06/22/2015 18:09 Full Code [id number]  Lytle Butte, MD ED   05/16/2015 12:00 05/16/2015 18:17 DNR [id number]  Max Sane, MD Inpatient   05/15/2015 01:10 05/16/2015 12:00 Full Code [id number]  Harrie Foreman, MD Inpatient   11/05/2014 12:35 11/05/2014 15:55 Full Code [id number]  Penne Lash, RN Inpatient   11/05/2014 12:17 11/05/2014 12:35 Full Code [id number]  Penne Lash, RN Inpatient      TOTAL TIME TAKING CARE OF THIS PATIENT: 38 minutes.    Haron Beilke M.D on 08/17/2017 at 12:27 PM  Between 7am to 6pm - Pager - [phone]  After 6pm go to [email or web address] - Proofreader  [employer] [facility]  Hospitalists  Office  [phone]  CC: Primary care physician; System, Pcp Not In   Note: This dictation was prepared with Dragon dictation along with smaller phrase technology. Any transcriptional errors that result from this process are unintentional.

## 2017-08-17 NOTE — Progress Notes (Signed)
PT Cancellation Note  Patient Details Name: [name] MRN: [id number] DOB: 30-Dec-1955   Cancelled Treatment:    Reason Eval/Treat Not Completed: Other (comment).  Declined therapy and text paged Dr. Tressia Miners the results of the attempt.  Will ck later as time allows.   Ramond Dial 08/17/2017, 12:03 PM   Mee Hives, PT MS Acute Rehab Dept. Number: [place] and [place]

## 2017-08-17 NOTE — Progress Notes (Signed)
[name] to be D/C'd Home per MD order.  Discussed prescriptions and follow up appointments with the patient. Prescriptions given to patient, medication list explained in detail. Pt verbalized understanding.  Allergies as of 08/17/2017      Reactions   Ibuprofen Other (See Comments)   Pt states that she is unable to take this medication because of her liver.    Sulfa Antibiotics Hives   Hydroxyzine Other (See Comments)   Causes crying    Amoxicillin Itching, Rash, Other (See Comments)   Has patient had a PCN reaction causing immediate rash, facial/tongue/throat swelling, SOB or lightheadedness with hypotension: No Has patient had a PCN reaction causing severe rash involving mucus membranes or skin necrosis: No Has patient had a PCN reaction that required hospitalization No Has patient had a PCN reaction occurring within the last 10 years: Yes If all of the above answers are "NO", then may proceed with Cephalosporin use.   Chocolate Rash   Chocolate Flavor Rash   Ciprofloxacin Itching, Rash   Dilaudid [hydromorphone Hcl] Itching   Fentanyl Rash   Hydromorphone Itching   Hydromorphone Hcl Itching   Metformin Nausea And Vomiting   Penicillins Itching, Rash, Other (See Comments)   Has patient had a PCN reaction causing immediate rash, facial/tongue/throat swelling, SOB or lightheadedness with hypotension: No Has patient had a PCN reaction causing severe rash involving mucus membranes or skin necrosis: No Has patient had a PCN reaction that required hospitalization No Has patient had a PCN reaction occurring within the last 10 years: Yes If all of the above answers are "NO", then may proceed with Cephalosporin use.   Strawberry Extract Rash      Medication List    STOP taking these medications   acetaminophen-codeine 300-30 MG tablet Commonly known as:  TYLENOL #3   AFLURIA QUADRIVALENT 0.5 ML injection Generic drug:  Influenza vac split quadrivalent PF   amLODipine 10 MG  tablet Commonly known as:  NORVASC   azithromycin 500 MG tablet Commonly known as:  ZITHROMAX   cefUROXime 500 MG tablet Commonly known as:  CEFTIN   chlorhexidine 0.12 % solution Commonly known as:  PERIDEX   ciprofloxacin 500 MG tablet Commonly known as:  CIPRO   clindamycin 300 MG capsule Commonly known as:  CLEOCIN   clotrimazole-betamethasone cream Commonly known as:  LOTRISONE   Desvenlafaxine ER 50 MG Tb24 Commonly known as:  PRISTIQ   diazepam 10 MG tablet Commonly known as:  VALIUM   ENSURE   escitalopram 10 MG tablet Commonly known as:  LEXAPRO   fluconazole 150 MG tablet Commonly known as:  DIFLUCAN   hydrOXYzine 25 MG tablet Commonly known as:  ATARAX/VISTARIL   hydrOXYzine 50 MG capsule Commonly known as:  VISTARIL   Influenza vac split quadrivalent PF 0.5 ML injection Commonly known as:  FLUARIX   levofloxacin 500 MG tablet Commonly known as:  LEVAQUIN   meloxicam 7.5 MG tablet Commonly known as:  MOBIC   metFORMIN 1000 MG tablet Commonly known as:  GLUCOPHAGE   metoCLOPramide 10 MG tablet Commonly known as:  REGLAN   metoprolol succinate 25 MG 24 hr tablet Commonly known as:  TOPROL-XL   nitrofurantoin 100 MG capsule Commonly known as:  MACRODANTIN   omeprazole 20 MG capsule Commonly known as:  PRILOSEC   ondansetron 4 MG tablet Commonly known as:  ZOFRAN   ONGLYZA 5 MG Tabs tablet Generic drug:  saxagliptin HCl   oxyCODONE-acetaminophen 10-325 MG tablet Commonly known as:  PERCOCET   phentermine 37.5 MG tablet Commonly known as:  ADIPEX-P   PNEUMOVAX 23 25 MCG/0.5ML injection Generic drug:  pneumococcal 23 valent vaccine   Polyethylene Glycol 3350 Gran   potassium chloride 10 MEQ tablet Commonly known as:  K-DUR   potassium chloride 10 MEQ tablet Commonly known as:  K-DUR,KLOR-CON   predniSONE 10 MG tablet Commonly known as:  DELTASONE   prochlorperazine 10 MG tablet Commonly known as:  COMPAZINE    sertraline 100 MG tablet Commonly known as:  ZOLOFT   SHINGRIX injection Generic drug:  Zoster Vaccine Adjuvanted   sucralfate 1 g tablet Commonly known as:  CARAFATE   temazepam 30 MG capsule Commonly known as:  RESTORIL   terconazole 0.4 % vaginal cream Commonly known as:  TERAZOL 7   terconazole 80 MG vaginal suppository Commonly known as:  TERAZOL 3   triamcinolone 55 MCG/ACT Aero nasal inhaler Commonly known as:  NASACORT   triamcinolone cream 0.1 % Commonly known as:  KENALOG   Venlafaxine HCl 150 MG Tb24   venlafaxine XR 75 MG 24 hr capsule Commonly known as:  EFFEXOR-XR     TAKE these medications   albuterol 108 (90 Base) MCG/ACT inhaler Commonly known as:  PROVENTIL HFA;VENTOLIN HFA Inhale 2 puffs into the lungs every 6 (six) hours as needed for wheezing or shortness of breath. What changed:  Another medication with the same name was removed. Continue taking this medication, and follow the directions you see here.   albuterol (5 MG/ML) 0.5% nebulizer solution Commonly known as:  PROVENTIL Take 2.5 mg by nebulization every 6 (six) hours as needed for wheezing or shortness of breath. What changed:  Another medication with the same name was removed. Continue taking this medication, and follow the directions you see here.   ALPRAZolam 1 MG tablet Commonly known as:  XANAX Take 1 tablet (1 mg total) by mouth at bedtime as needed.   amitriptyline 50 MG tablet Commonly known as:  ELAVIL Take 50 mg by mouth at bedtime.   busPIRone 10 MG tablet Commonly known as:  BUSPAR Take 10 mg by mouth daily.   citalopram 20 MG tablet Commonly known as:  CELEXA 1 tablet by mouth daily   diphenhydrAMINE 25 mg capsule Commonly known as:  BENADRYL Take 1 capsule (25 mg total) by mouth every 8 (eight) hours as needed for itching.   DULERA 200-5 MCG/ACT Aero Generic drug:  mometasone-formoterol Inhale 2 puffs into the lungs 2 (two) times daily.   FIFTY50 GLUCOSE  METER 2.0 w/Device Kit ICD-10 E11.9 Check blood sugars once daily   Fluticasone-Salmeterol 250-50 MCG/DOSE Aepb Commonly known as:  ADVAIR Inhale 1 puff into the lungs 2 (two) times daily. What changed:  Another medication with the same name was removed. Continue taking this medication, and follow the directions you see here.   furosemide 20 MG tablet Commonly known as:  LASIX Take 20 mg by mouth every morning.   gabapentin 300 MG capsule Commonly known as:  NEURONTIN Take 600 mg by mouth 4 (four) times daily.   Ipratropium-Albuterol 20-100 MCG/ACT Aers respimat Commonly known as:  COMBIVENT Inhale 2 puffs into the lungs every 6 (six) hours as needed for wheezing or shortness of breath.   lactulose 10 GM/15ML solution Commonly known as:  CHRONULAC lactulose 10 gram/15 mL oral solution   levETIRAcetam 500 MG tablet Commonly known as:  KEPPRA Take 500 mg by mouth 2 (two) times daily.   lipase/protease/amylase 12000 units Cpep capsule Commonly  known as:  CREON Take 24,000 Units by mouth 3 (three) times daily with meals.   MICROLET LANCETS Misc Microlet Lancet What changed:  Another medication with the same name was removed. Continue taking this medication, and follow the directions you see here.   nicotine 7 mg/24hr patch Commonly known as:  NICODERM CQ - dosed in mg/24 hr Place onto the skin.   Oxycodone HCl 20 MG Tabs Take 20 mg by mouth 3 (three) times daily as needed (pain). What changed:  Another medication with the same name was removed. Continue taking this medication, and follow the directions you see here.   pantoprazole 40 MG tablet Commonly known as:  PROTONIX Take 1 tablet (40 mg total) by mouth daily.   phenytoin 100 MG/4ML suspension Commonly known as:  DILANTIN Take 300 mg by mouth 2 (two) times daily. Takes 75m by mouth bid   pravastatin 10 MG tablet Commonly known as:  PRAVACHOL Take 10 mg by mouth daily.   promethazine 25 MG tablet Commonly  known as:  PHENERGAN Take 25 mg by mouth every 8 (eight) hours as needed for nausea or vomiting.   QUEtiapine 100 MG tablet Commonly known as:  SEROQUEL quetiapine 100 mg tablet   spironolactone 25 MG tablet Commonly known as:  ALDACTONE Take 1 tablet (25 mg total) by mouth daily. What changed:    medication strength  how much to take  when to take this   tiotropium 18 MCG inhalation capsule Commonly known as:  SPIRIVA Place 18 mcg into inhaler and inhale daily as needed (shortness of breath).   traMADol 50 MG tablet Commonly known as:  ULTRAM Take 1 tablet (50 mg total) by mouth every 6 (six) hours as needed for moderate pain.   VESICARE 10 MG tablet Generic drug:  solifenacin Vesicare 10 mg tablet   zolpidem 5 MG tablet Commonly known as:  AMBIEN Take 1 tablet (5 mg total) by mouth at bedtime as needed for sleep. What changed:    See the new instructions.  Another medication with the same name was removed. Continue taking this medication, and follow the directions you see here.       Vitals:   08/17/17 0550 08/17/17 0811  BP: 133/80 137/81  Pulse: 83 80  Resp: 18 18  Temp: 98.8 F (37.1 C) 98.9 F (37.2 C)  SpO2: 100% 94%    Skin clean, dry and intact without evidence of skin break down, no evidence of skin tears noted. IV catheter discontinued intact. Site without signs and symptoms of complications. Dressing and pressure applied. Pt denies pain at this time. No complaints noted.  An After Visit Summary was printed and given to the patient. Patient waiting for ride to be D/C home via private auto.  [place]

## 2017-08-17 NOTE — Progress Notes (Signed)
Called into room by belinda graves cna. Upon entering bathroom patient was found on the floor. Fall was witnessed by cna patient was being assisted from bathroom to wheelchair. Patient partially sat in wheelchair and then slide into floor. No c/o pain, vital signs stable. Dr. Royetta Car called and made aware, per md patient is still okay to be discharged, will keep patient for an hour and monitor vitals prior to discharging.

## 2017-08-18 ENCOUNTER — Telehealth: Payer: Self-pay | Admitting: Gastroenterology

## 2017-08-18 NOTE — Telephone Encounter (Signed)
Attempted to return patients call. Left voice message for her to call back

## 2017-08-24 IMAGING — CT CT HEAD W/O CM
2 series · 15 of 30 positions shown, 17 images · non-contrast
Comparison: Multiple priors.

CLINICAL DATA: Preop for cranioplasty. Headaches. Previous subdural
hematoma.

EXAM:
CT HEAD WITHOUT CONTRAST
TECHNIQUE: Contiguous axial images were obtained from the base of the skull
through the vertex without intravenous contrast.

[Series 2: head without · axial · non-contrast · 0.45mm/px · z∈[-144,-29]mm · 7 of 31 slices shown, 9 images]
[im 4/31  brain]
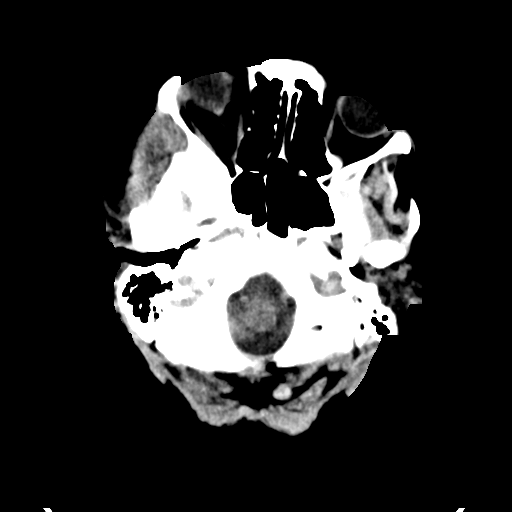
[im 4/31  bone]
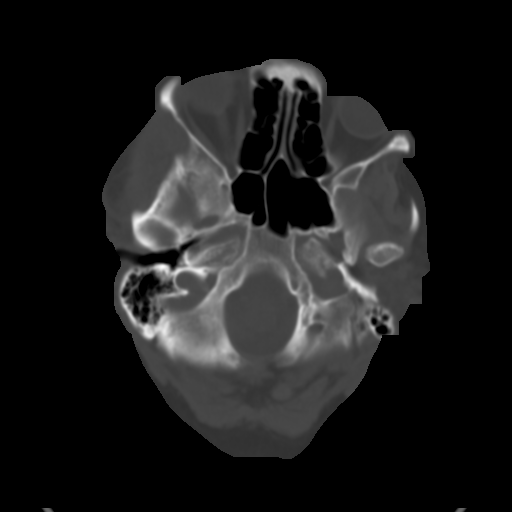
[im 8/31  brain]
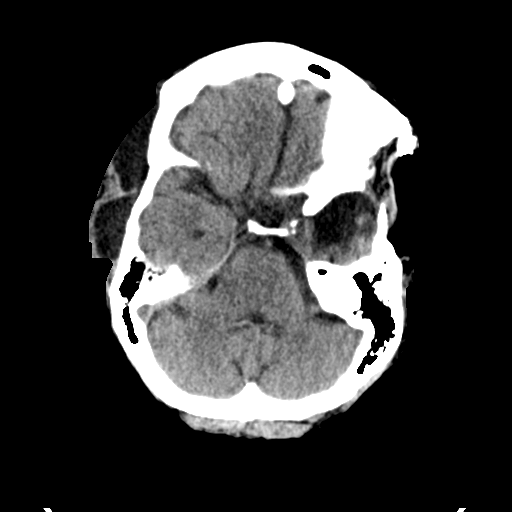
[im 12/31  brain]
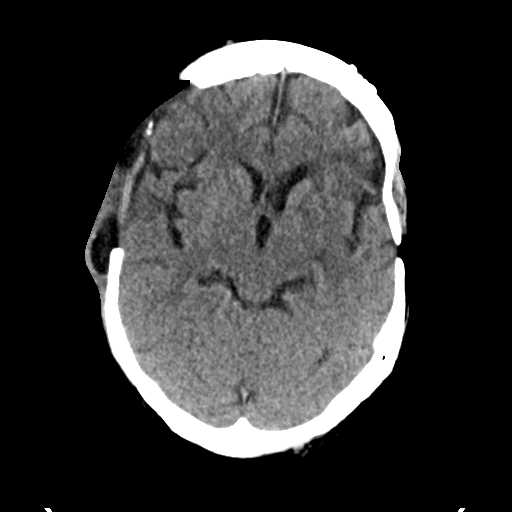
[im 16/31  brain]
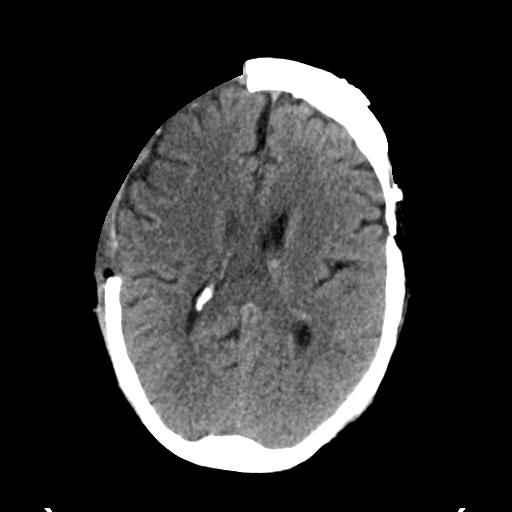
[im 19/31  brain]
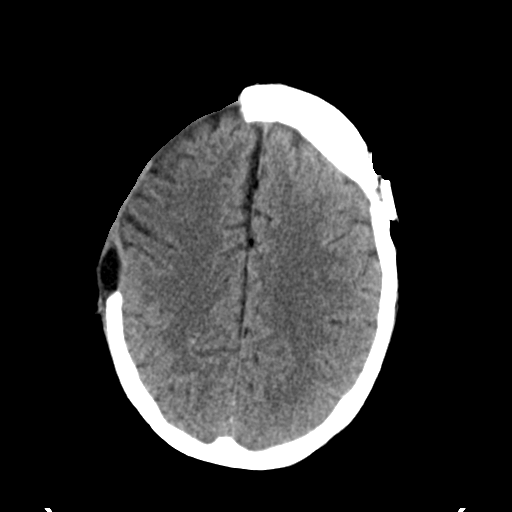
[im 19/31  bone]
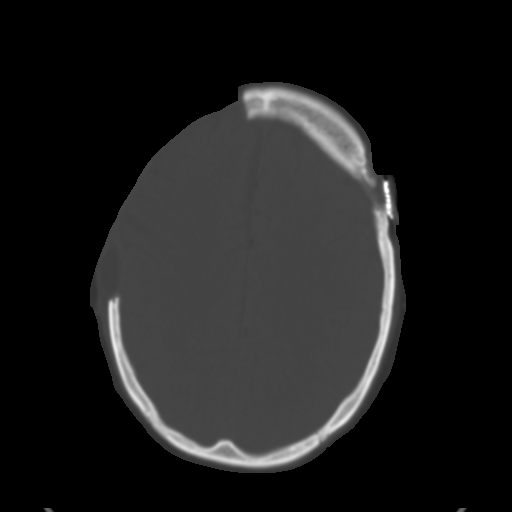
[im 23/31  brain]
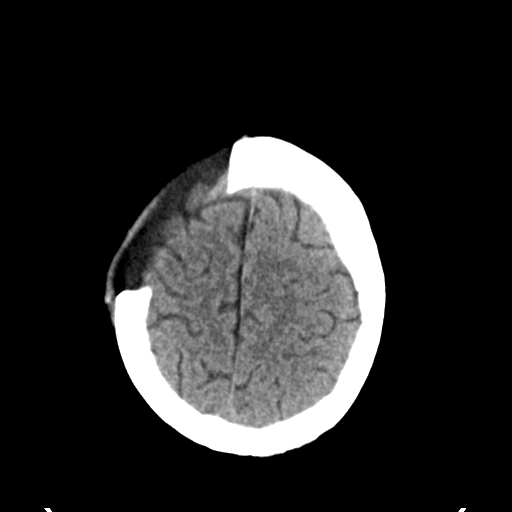
[im 27/31  brain]
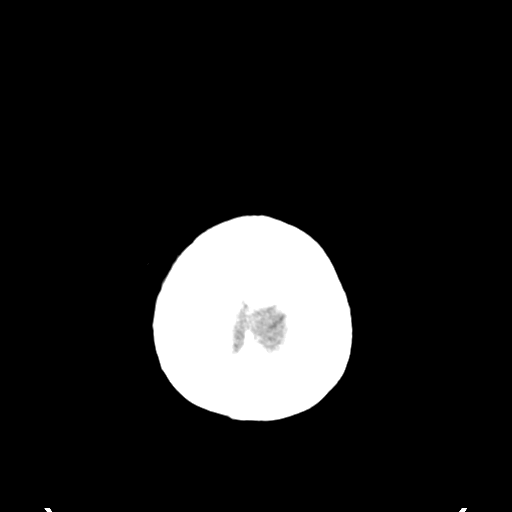

[Series 3: head bone · axial · 0.45mm/px · z∈[-145,-21]mm · 8 of 78 slices shown]
[im 8/78  bone]
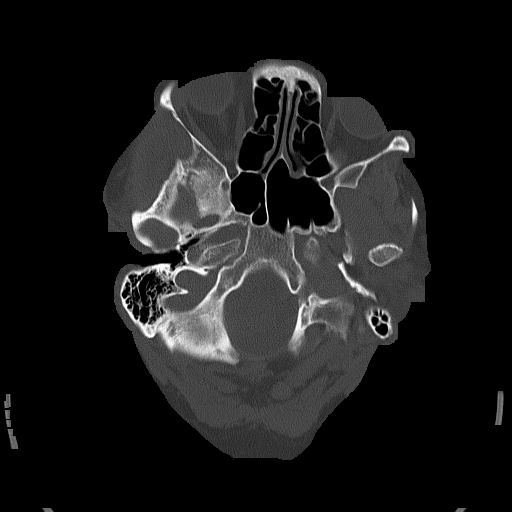
[im 16/78  bone]
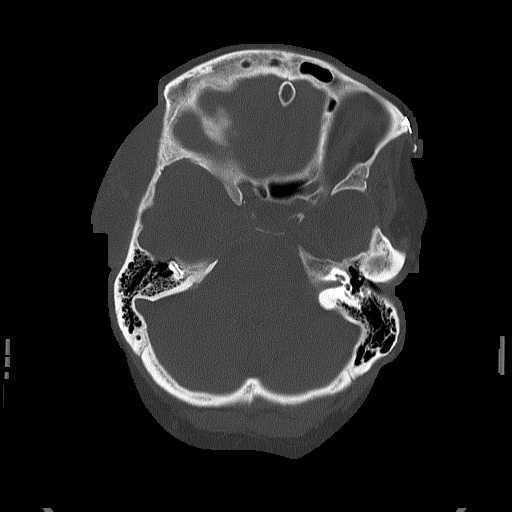
[im 24/78  bone]
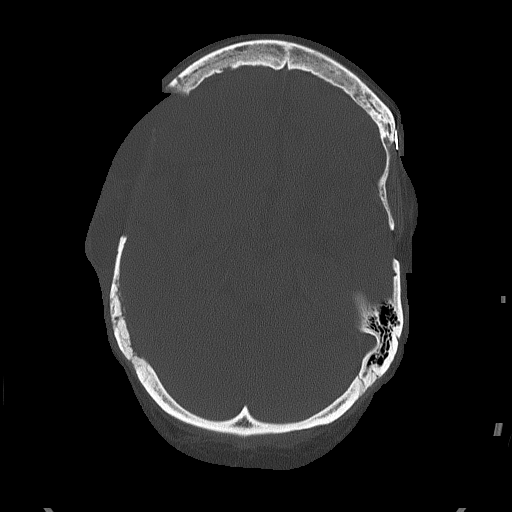
[im 35/78  bone]
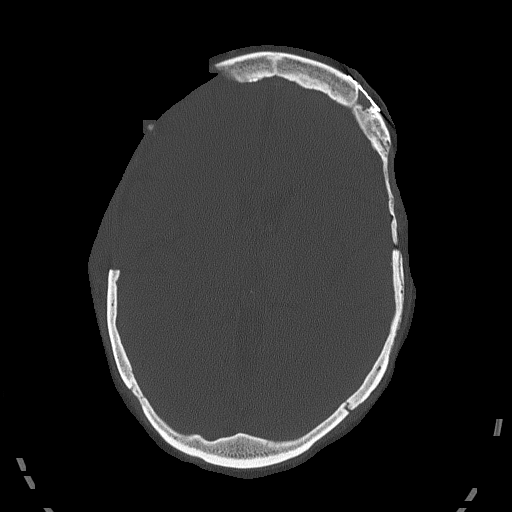
[im 43/78  bone]
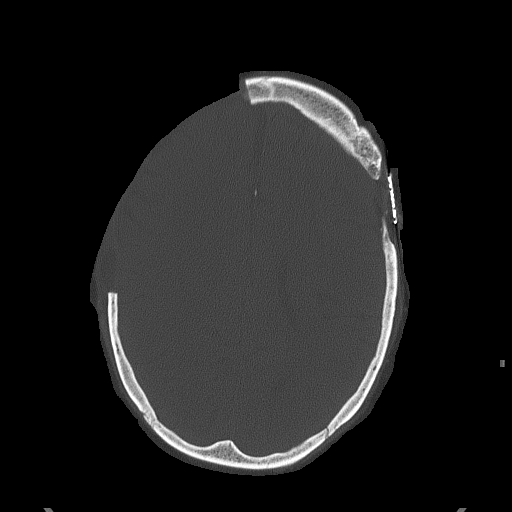
[im 54/78  bone]
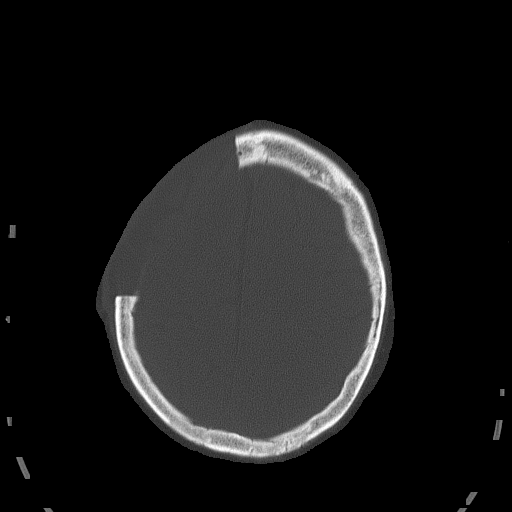
[im 62/78  bone]
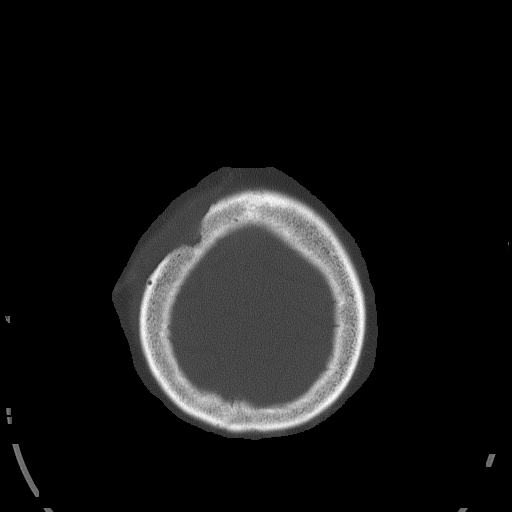
[im 70/78  bone]
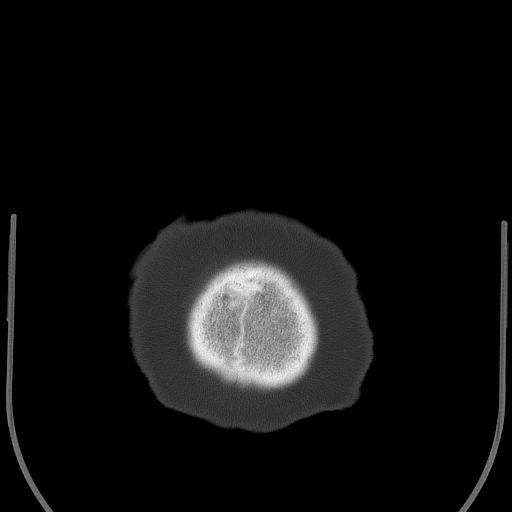

[15 of 30 positions shown; findings below may reference images not displayed]

FINDINGS: No acute stroke, acute hemorrhage, mass lesion, hydrocephalus, or
extra-axial fluid. Large RIGHT frontotemporal craniectomy defect,
with a bilobed hypodense subgaleal effusion or pseudomeningocele
measuring up to 14 mm thick, and 81 mm anterior-posterior. No
residual subdural hematoma.

Baseline atrophy. Hypoattenuation of white matter, likely chronic
microvascular ischemic change.

Chronic LEFT frontotemporal infarction stable. Previous LEFT
frontotemporal craniotomy appears uncomplicated. No sinus air-fluid
level. Dense lenticular opacities. Dysconjugate gaze. Suspected
previous LEFT blowout injury.
IMPRESSION: Postsurgical and posttraumatic changes as described. Hypodense
subgaleal fluid on the RIGHT is associated with a large craniectomy
defect. See discussion above.

No residual subdural hematoma.

## 2017-08-26 IMAGING — CR DG CHEST 1V PORT
1 series · 1 of 1 positions shown · non-contrast
Comparison: 08/20/2015

CLINICAL DATA: Chest pain, shortness of Breath.

EXAM:
PORTABLE CHEST 1 VIEW

[AP]
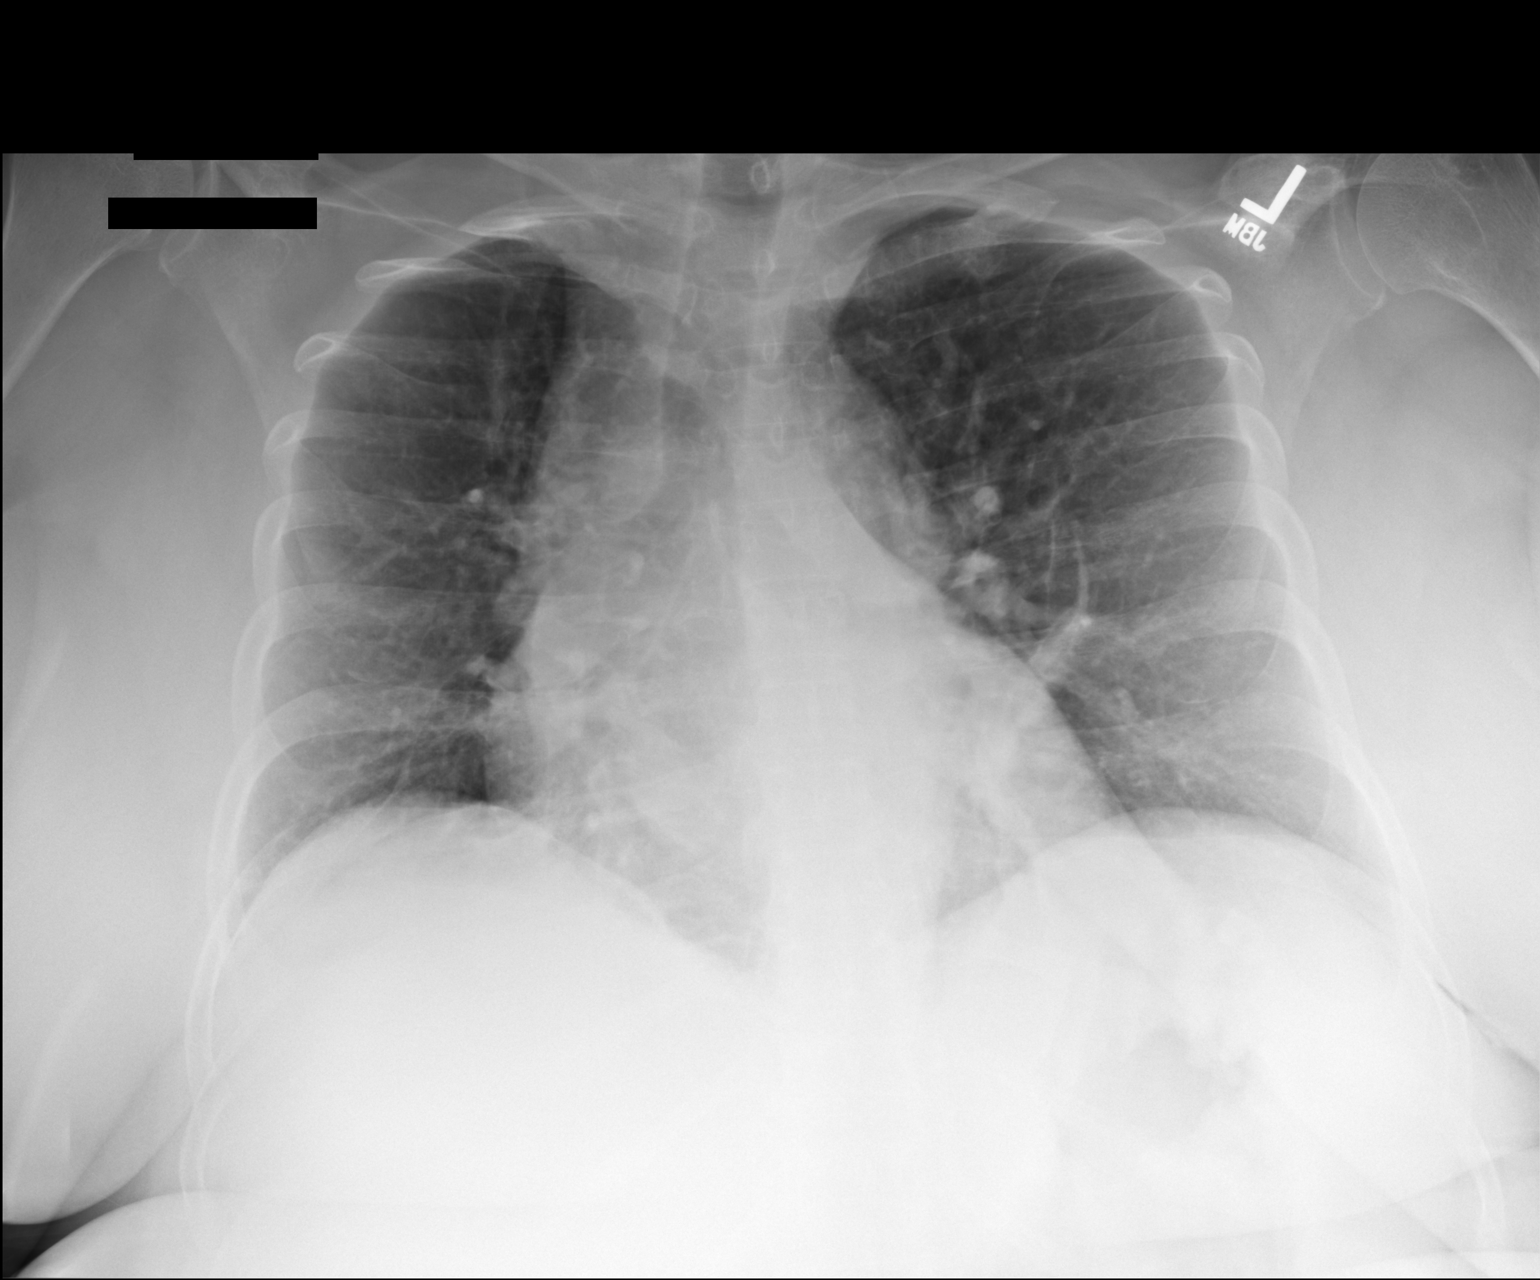

[1 of 1 positions shown; findings below may reference images not displayed]

FINDINGS: Linear subsegmental atelectasis within the lingula. Right base
atelectasis. Heart is upper limits normal in size. No effusions or
acute bony abnormality. No edema.
IMPRESSION: Areas of atelectasis bilaterally.  No acute cardiopulmonary disease.

## 2017-08-26 IMAGING — CT CT ANGIO CHEST
2 of 7 series · 18 of 36 positions shown · IV contrast (Omni 300)
Comparison: Radiograph earlier this day.  Chest CT 09/30/2014

CLINICAL DATA: Postop cranioplasty yesterday. Chest tightness,
shortness of breath and chest pressure today.

EXAM:
CT ANGIOGRAPHY CHEST WITH CONTRAST
TECHNIQUE: Multidetector CT imaging of the chest was performed using the
standard protocol during bolus administration of intravenous
contrast. Multiplanar CT image reconstructions and MIPs were
obtained to evaluate the vascular anatomy.
CONTRAST:  80mL OMNIPAQUE IOHEXOL 350 MG/ML SOLN

[Series 6: pe thins · axial · 0.65mm/px · z∈[+1348,+1568]mm · 17 of 496 slices shown]
[im 28/496  lung]
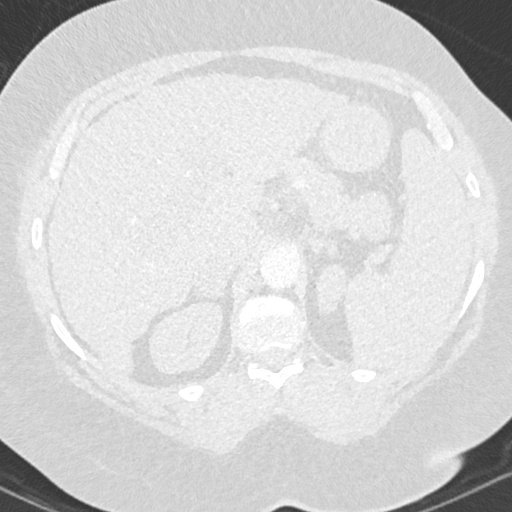
[im 56/496  mediastinal]
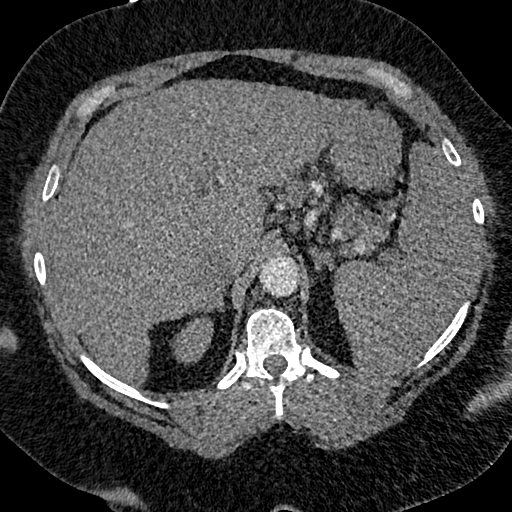
[im 83/496  lung]
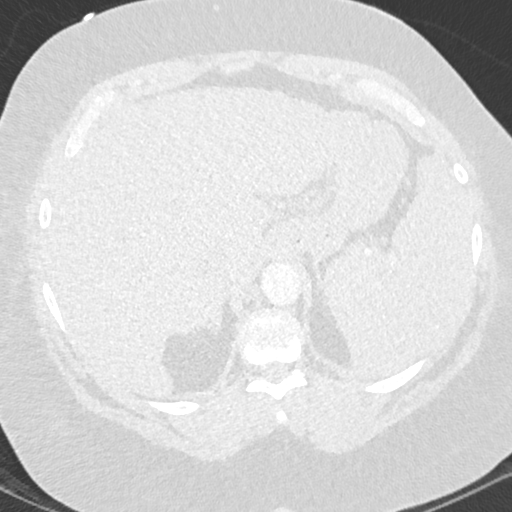
[im 111/496  mediastinal]
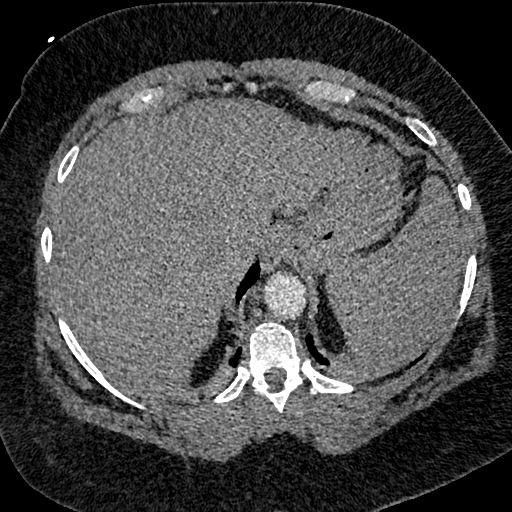
[im 138/496  lung]
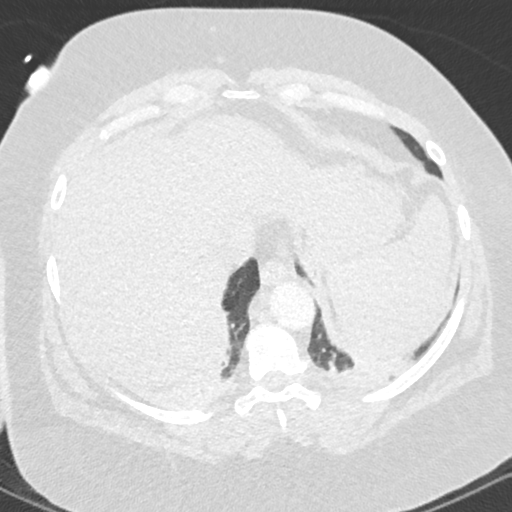
[im 166/496  mediastinal]
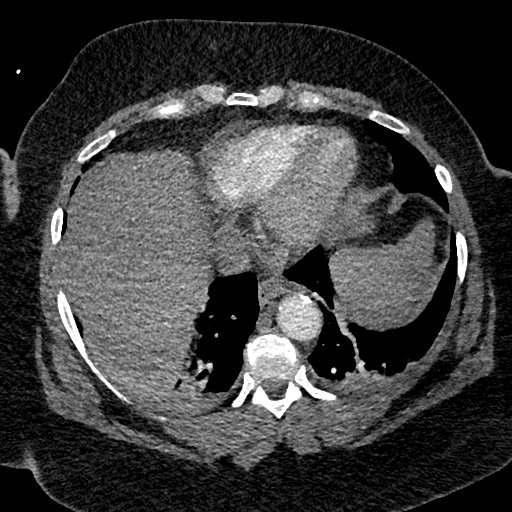
[im 193/496  lung]
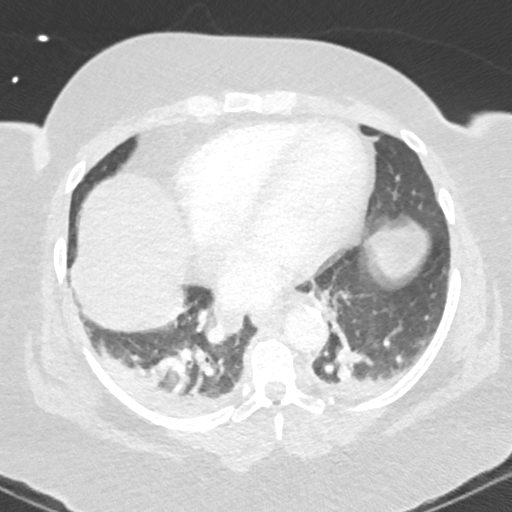
[im 221/496  mediastinal]
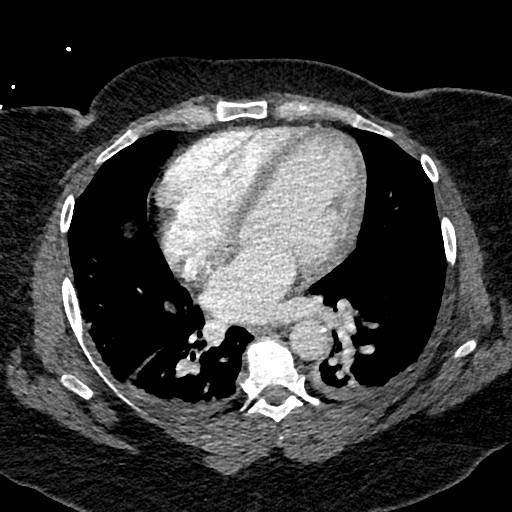
[im 248/496  lung]
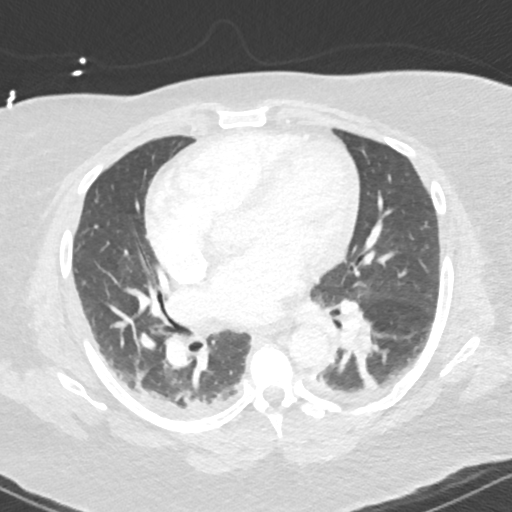
[im 276/496  mediastinal]
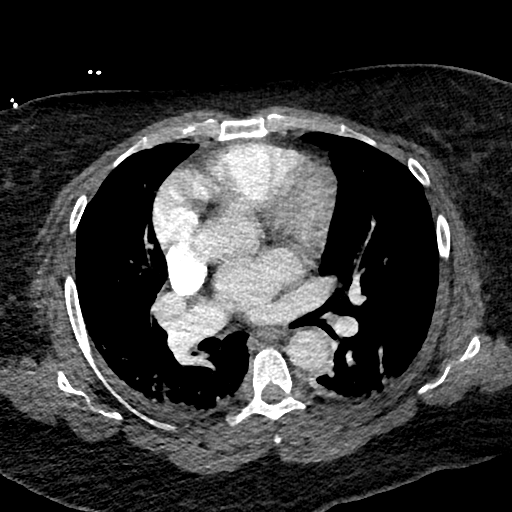
[im 303/496  lung]
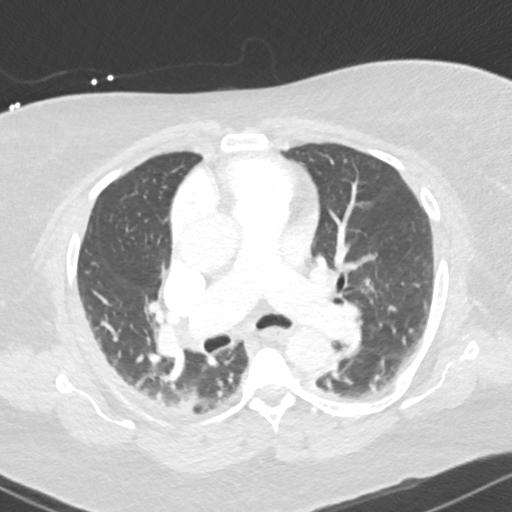
[im 331/496  mediastinal]
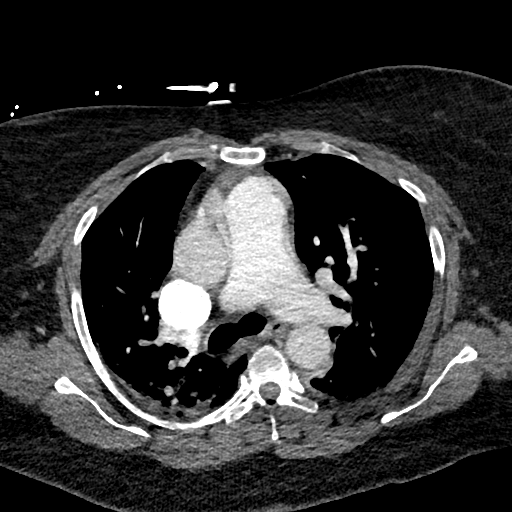
[im 358/496  lung]
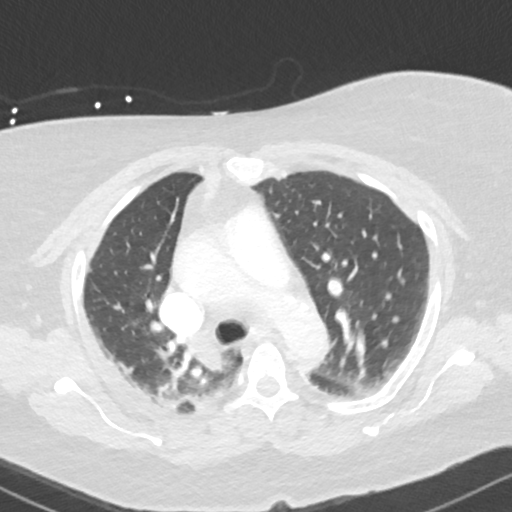
[im 386/496  mediastinal]
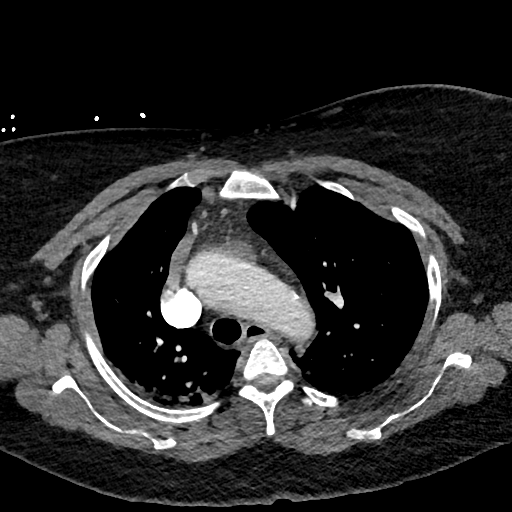
[im 413/496  lung]
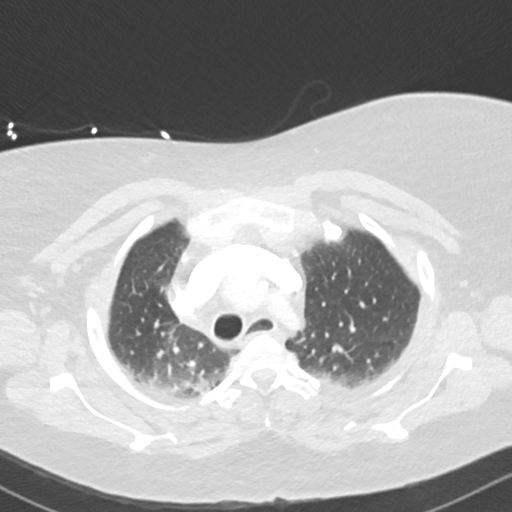
[im 441/496  mediastinal]
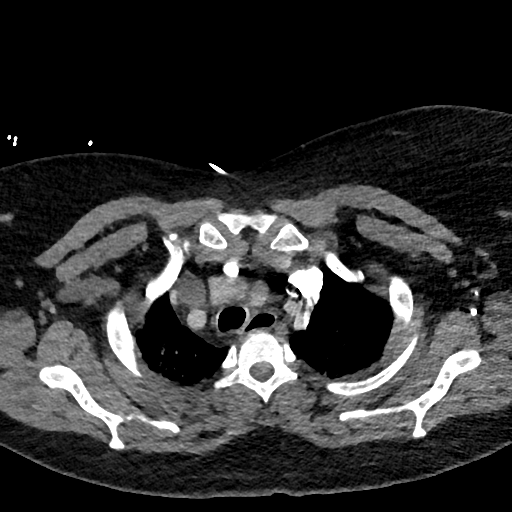
[im 468/496  lung]
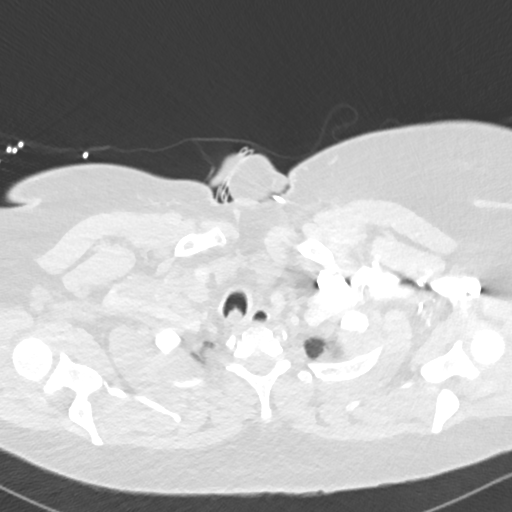

[Series 7: pe 2mm cor · coronal · 0.52mm/px · 1 of 130 slices shown]
[im 65/130  mediastinal]
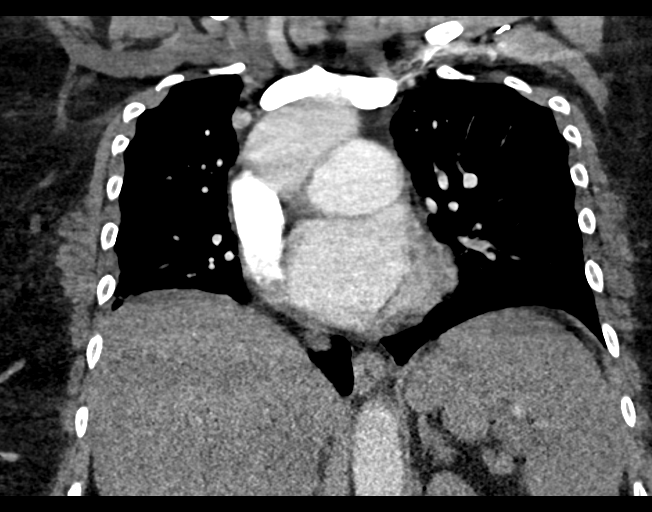

[18 of 36 positions shown; findings below may reference images not displayed]

FINDINGS: There are no filling defects within the pulmonary arteries to
suggest pulmonary embolus.

Normal caliber thoracic aorta without dissection. Moderate
atherosclerosis. Coronary artery calcifications are seen. There is
mild multi chamber cardiomegaly. Small bilateral pleural effusions,
right greater than left. No pericardial effusion. No mediastinal or
hilar adenopathy.

Dependent densities in both lungs consistent with hypoventilatory
change. Mild compressive atelectasis in both lower lobes. No
findings of pulmonary edema. Mild bronchial thickening.

Nodular hepatic contours in the upper abdomen consistent with
cirrhosis. Left adrenal nodule is partially included, grossly
unchanged. There is splenomegaly.

There are no acute or suspicious osseous abnormalities.

Review of the MIP images confirms the above findings.
IMPRESSION: 1. No pulmonary embolus.
2. Scattered atelectasis with small bilateral pleural effusions.
3. Atherosclerosis including coronary artery calcifications. Mild
multi chamber cardiomegaly. No findings to suggest CHF.

## 2017-09-05 DEATH — deceased

## 2017-09-29 IMAGING — CT CT HEAD W/O CM
3 of 5 series · 16 of 30 positions shown, 18 images · non-contrast
Comparison: CT of the head performed 09/14/2015

CLINICAL DATA: Acute onset of fever. Recent drainage of subdural
hematoma. Initial encounter.

EXAM:
CT HEAD WITHOUT CONTRAST
TECHNIQUE: Contiguous axial images were obtained from the base of the skull
through the vertex without intravenous contrast.

[Series 2: soft tissue · axial · 0.44mm/px · z∈[+342,+462]mm · 7 of 32 slices shown, 9 images]
[im 4/32  brain]
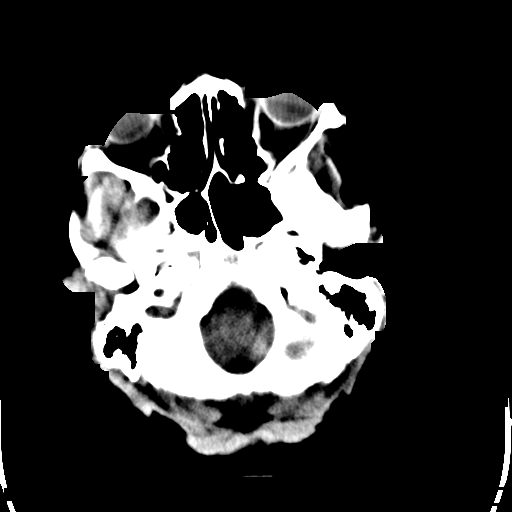
[im 4/32  bone]
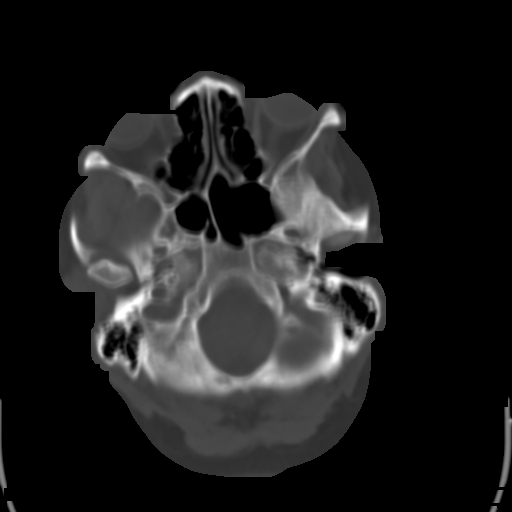
[im 8/32  brain]
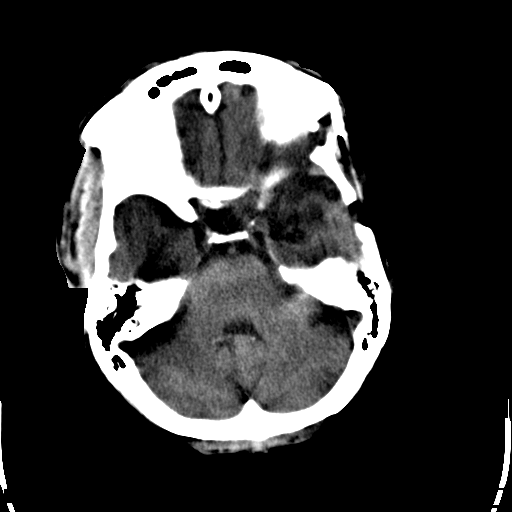
[im 12/32  brain]
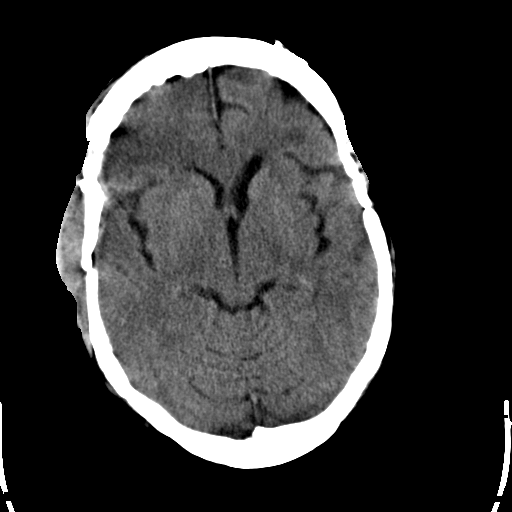
[im 16/32  brain]
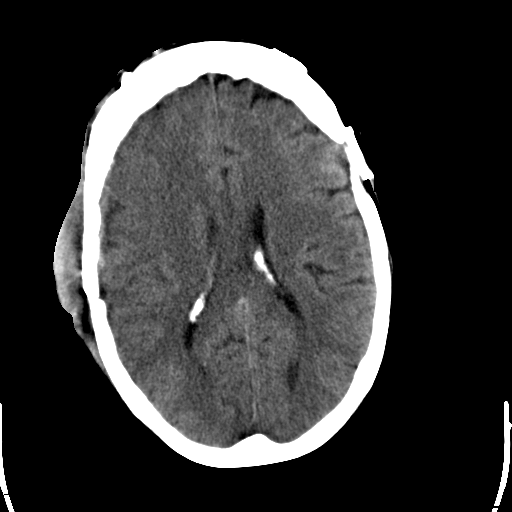
[im 20/32  brain]
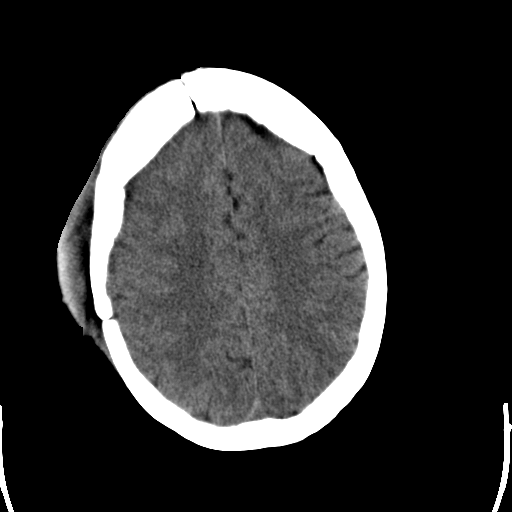
[im 20/32  bone]
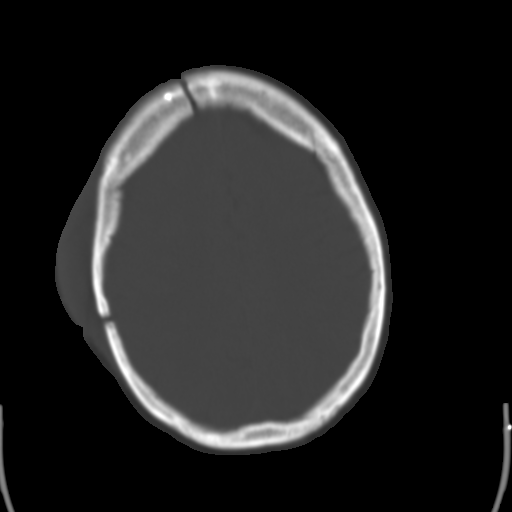
[im 24/32  brain]
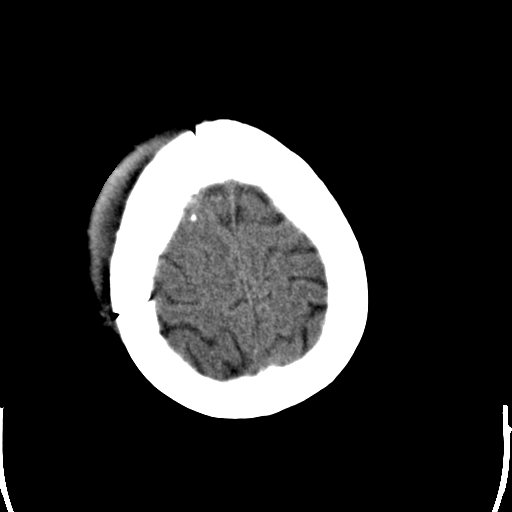
[im 28/32  brain]
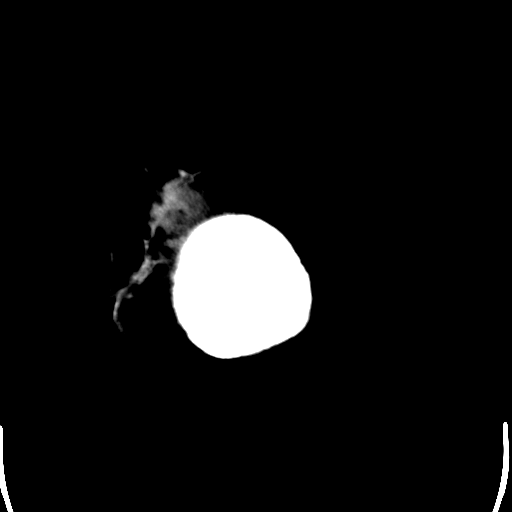

[Series 6: soft tissue recon · axial · 0.42mm/px · z∈[+375,+484]mm · 7 of 30 slices shown (1 of 2)]
[im 4/30  brain]
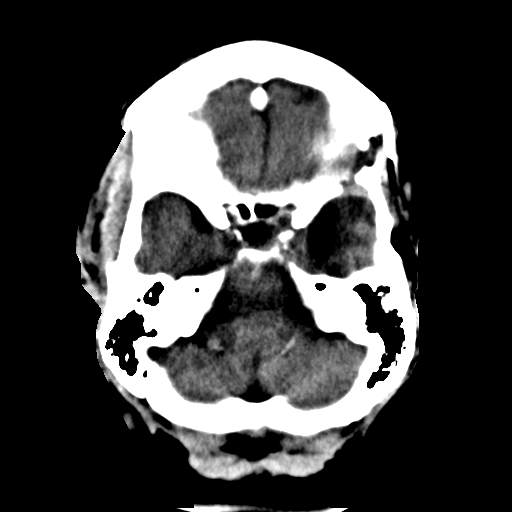
[im 8/30  brain]
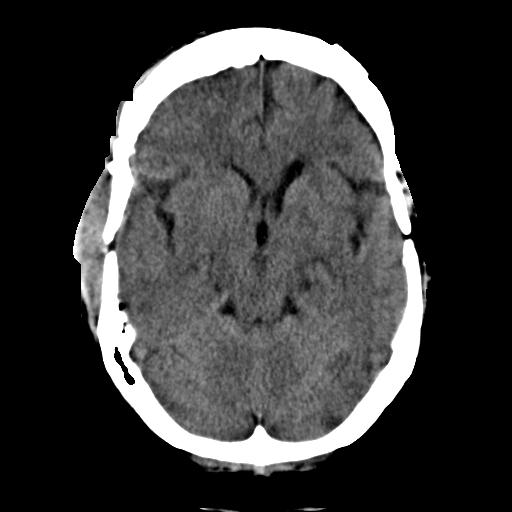
[im 11/30  brain]
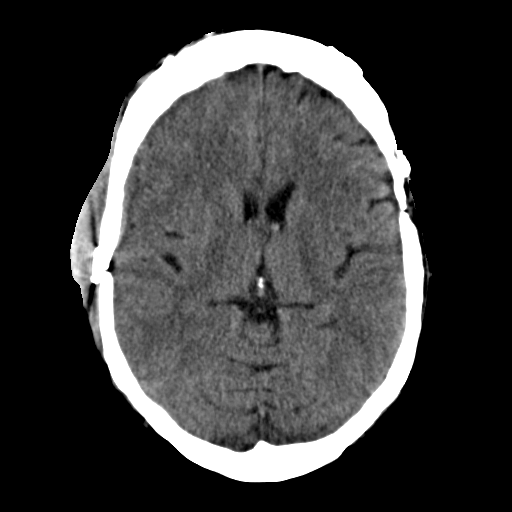
[im 15/30  brain]
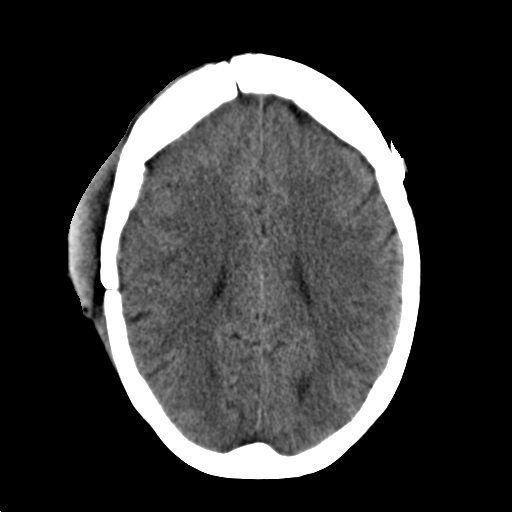
[im 19/30  brain]
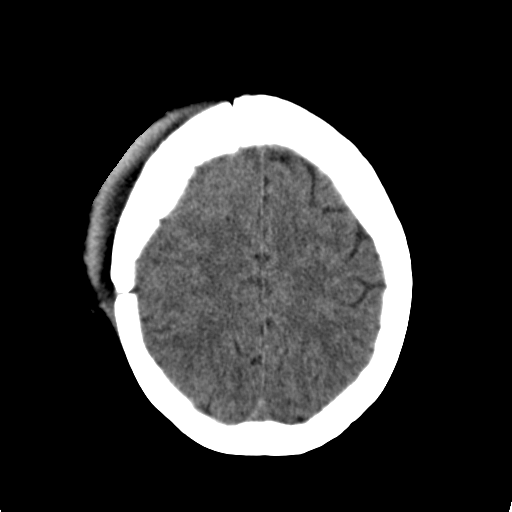
[im 22/30  brain]
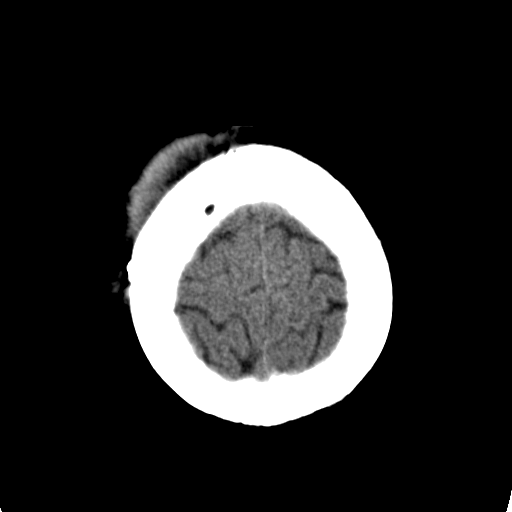
[im 26/30  brain]
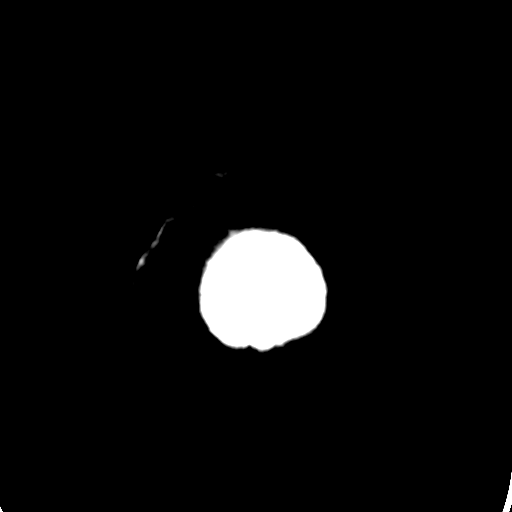

[Series 8: soft tissue recon · axial · 0.42mm/px · z∈[+381,+401]mm · 2 of 13 slices shown (2 of 2)]
[im 5/13  brain]
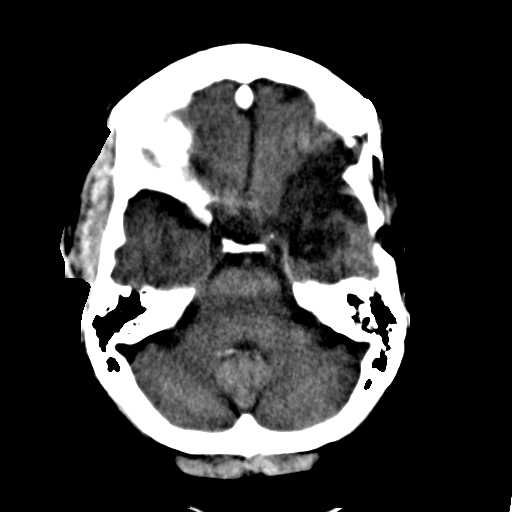
[im 9/13  brain]
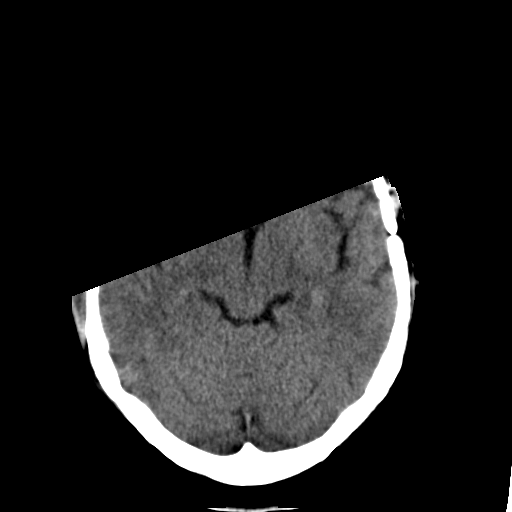

[16 of 30 positions shown; findings below may reference images not displayed]

FINDINGS: There is no evidence of acute infarction, mass lesion, or intra- or
extra-axial hemorrhage on CT. Mild subdural thickening underlying
the patient's right-sided craniotomy flap is thought to be
postoperative in nature, without definite evidence of subdural
hematoma.

Postoperative encephalomalacia is noted at the left frontotemporal
region.

The posterior fossa, including the cerebellum, brainstem and fourth
ventricle, is within normal limits. The third and lateral
ventricles, and basal ganglia are unremarkable in appearance. The
cerebral hemispheres are symmetric in appearance, with normal
gray-white differentiation. No mass effect or midline shift is seen.

There is no evidence of fracture; bilateral craniotomy flaps are
noted, with soft tissue swelling overlying the right craniotomy
flap. The visualized portions of the orbits are within normal
limits. The paranasal sinuses and mastoid air cells are
well-aerated. No additional soft tissue abnormalities are seen.
IMPRESSION: 1. No definite evidence of acute subdural hematoma.
2. Mild subdural thickening underlying the right-sided craniotomy
flap is thought to be postoperative in nature.
3. Postoperative encephalomalacia at the left frontotemporal region.
4. Soft tissue swelling overlying the right craniotomy flap.

## 2017-09-29 IMAGING — DX DG CHEST 1V PORT
1 series · 1 of 1 positions shown · non-contrast
Comparison: Chest radiograph 09/16/2015; chest CT 09/16/2015

CLINICAL DATA: Patient with fever.

EXAM:
PORTABLE CHEST 1 VIEW

[chest ap]
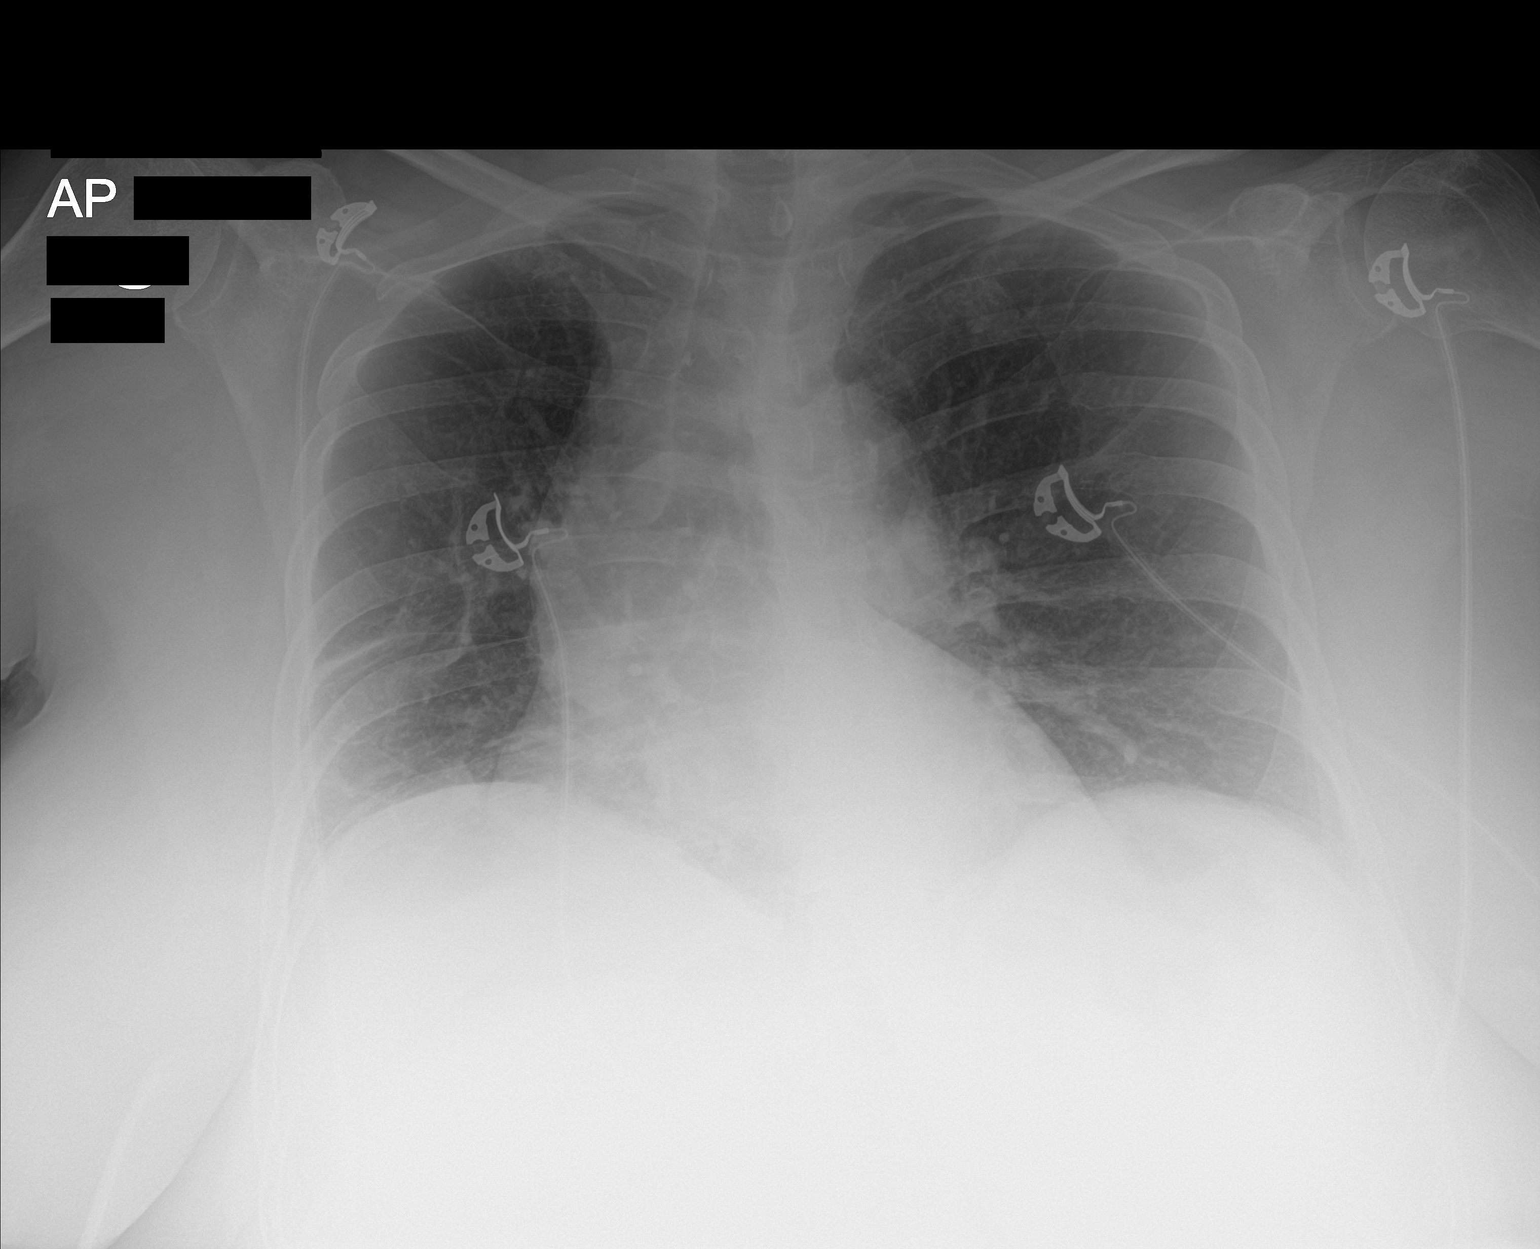

[1 of 1 positions shown; findings below may reference images not displayed]

FINDINGS: Monitoring leads overlie the patient. Low lung volumes. Stable
enlarged cardiac and mediastinal contours. Minimal bandlike
opacities right lower lung. No pleural effusion or pneumothorax.
IMPRESSION: Low lung volumes with probable atelectasis right lung base.

## 2017-09-30 IMAGING — CR DG CHEST 2V
1 series · 2 of 2 positions shown · non-contrast
Comparison: 10/20/2015

CLINICAL DATA: Cough and fever today.

EXAM:
CHEST  2 VIEW

[Series 1: dg chest 2 view · 0.14mm/px · 2 of 2 slices shown]
[im 1/2]
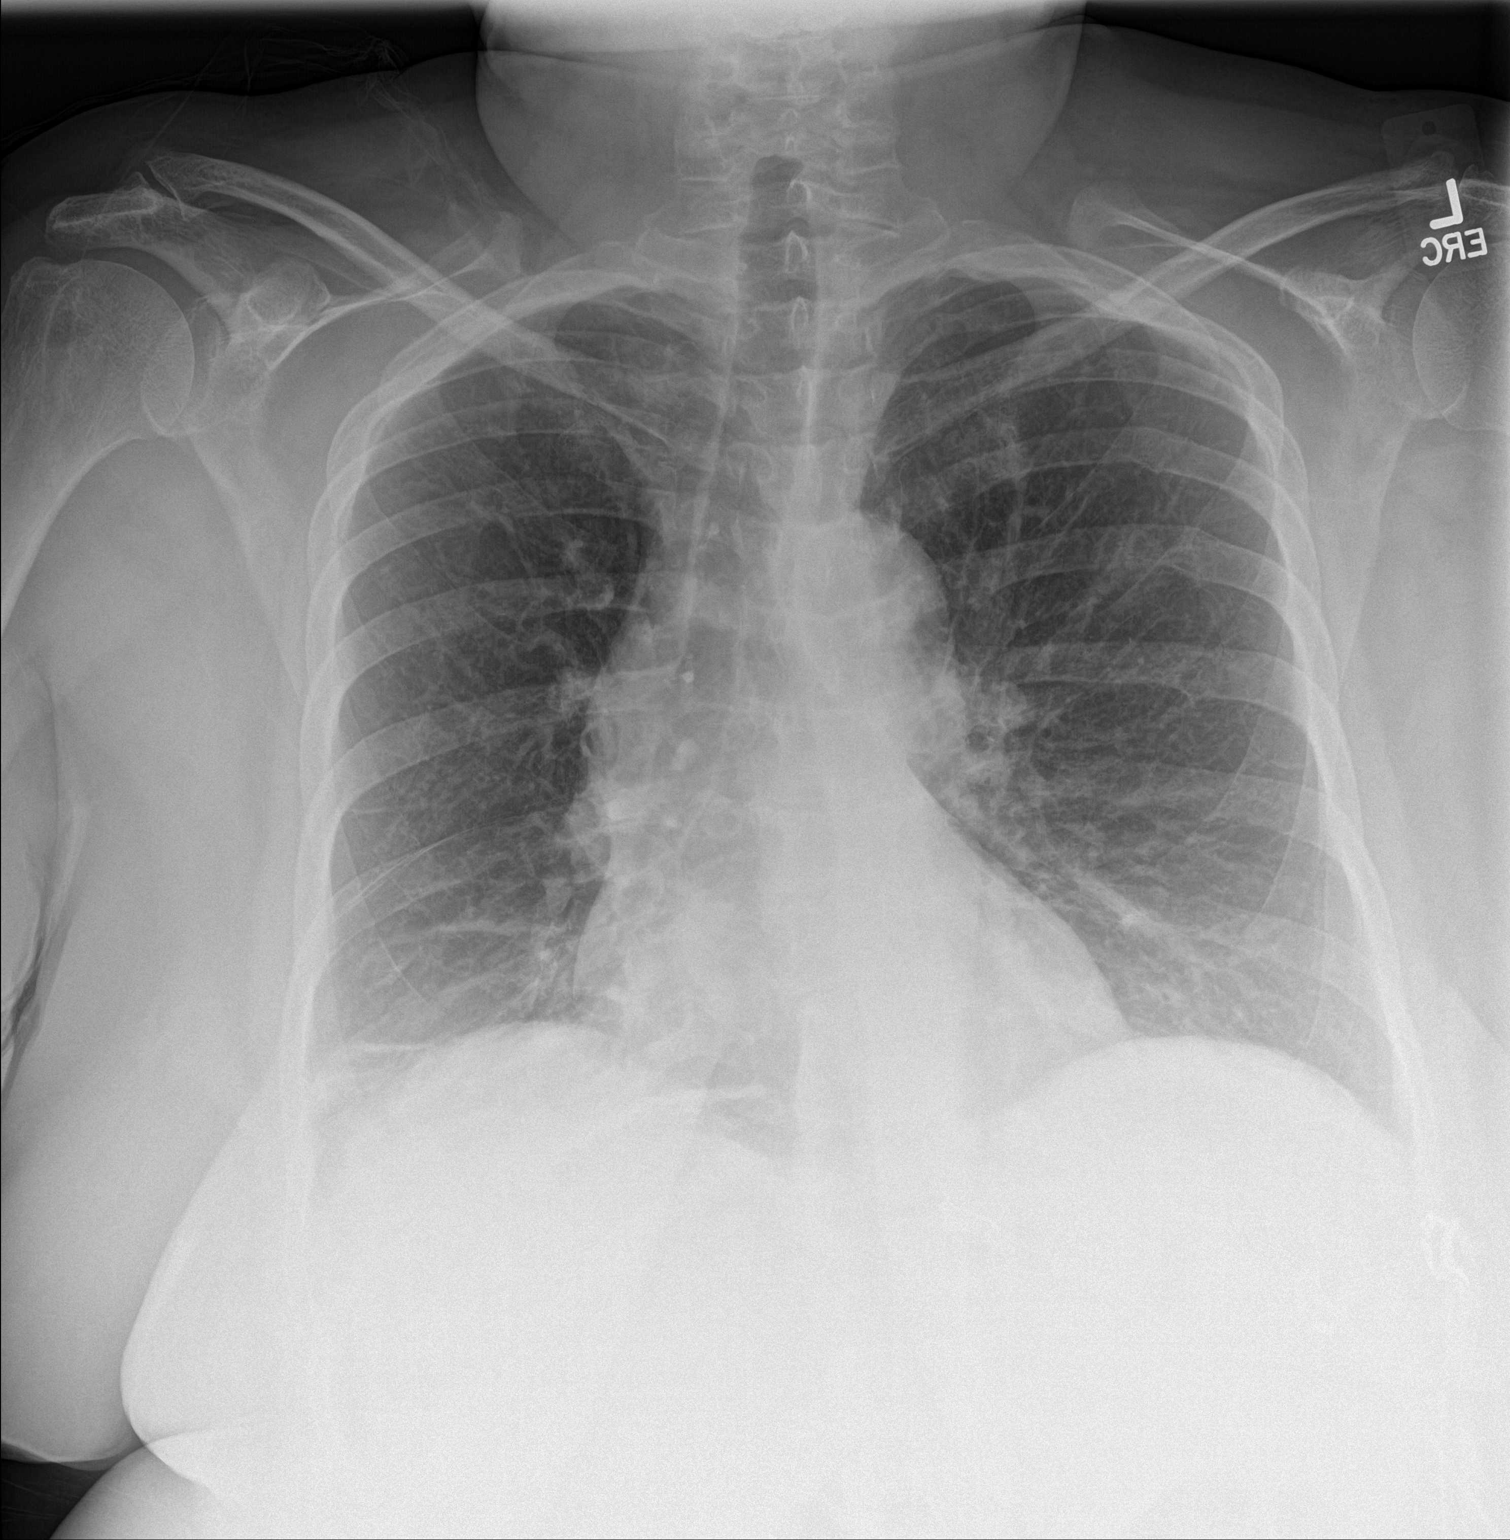
[im 2/2]
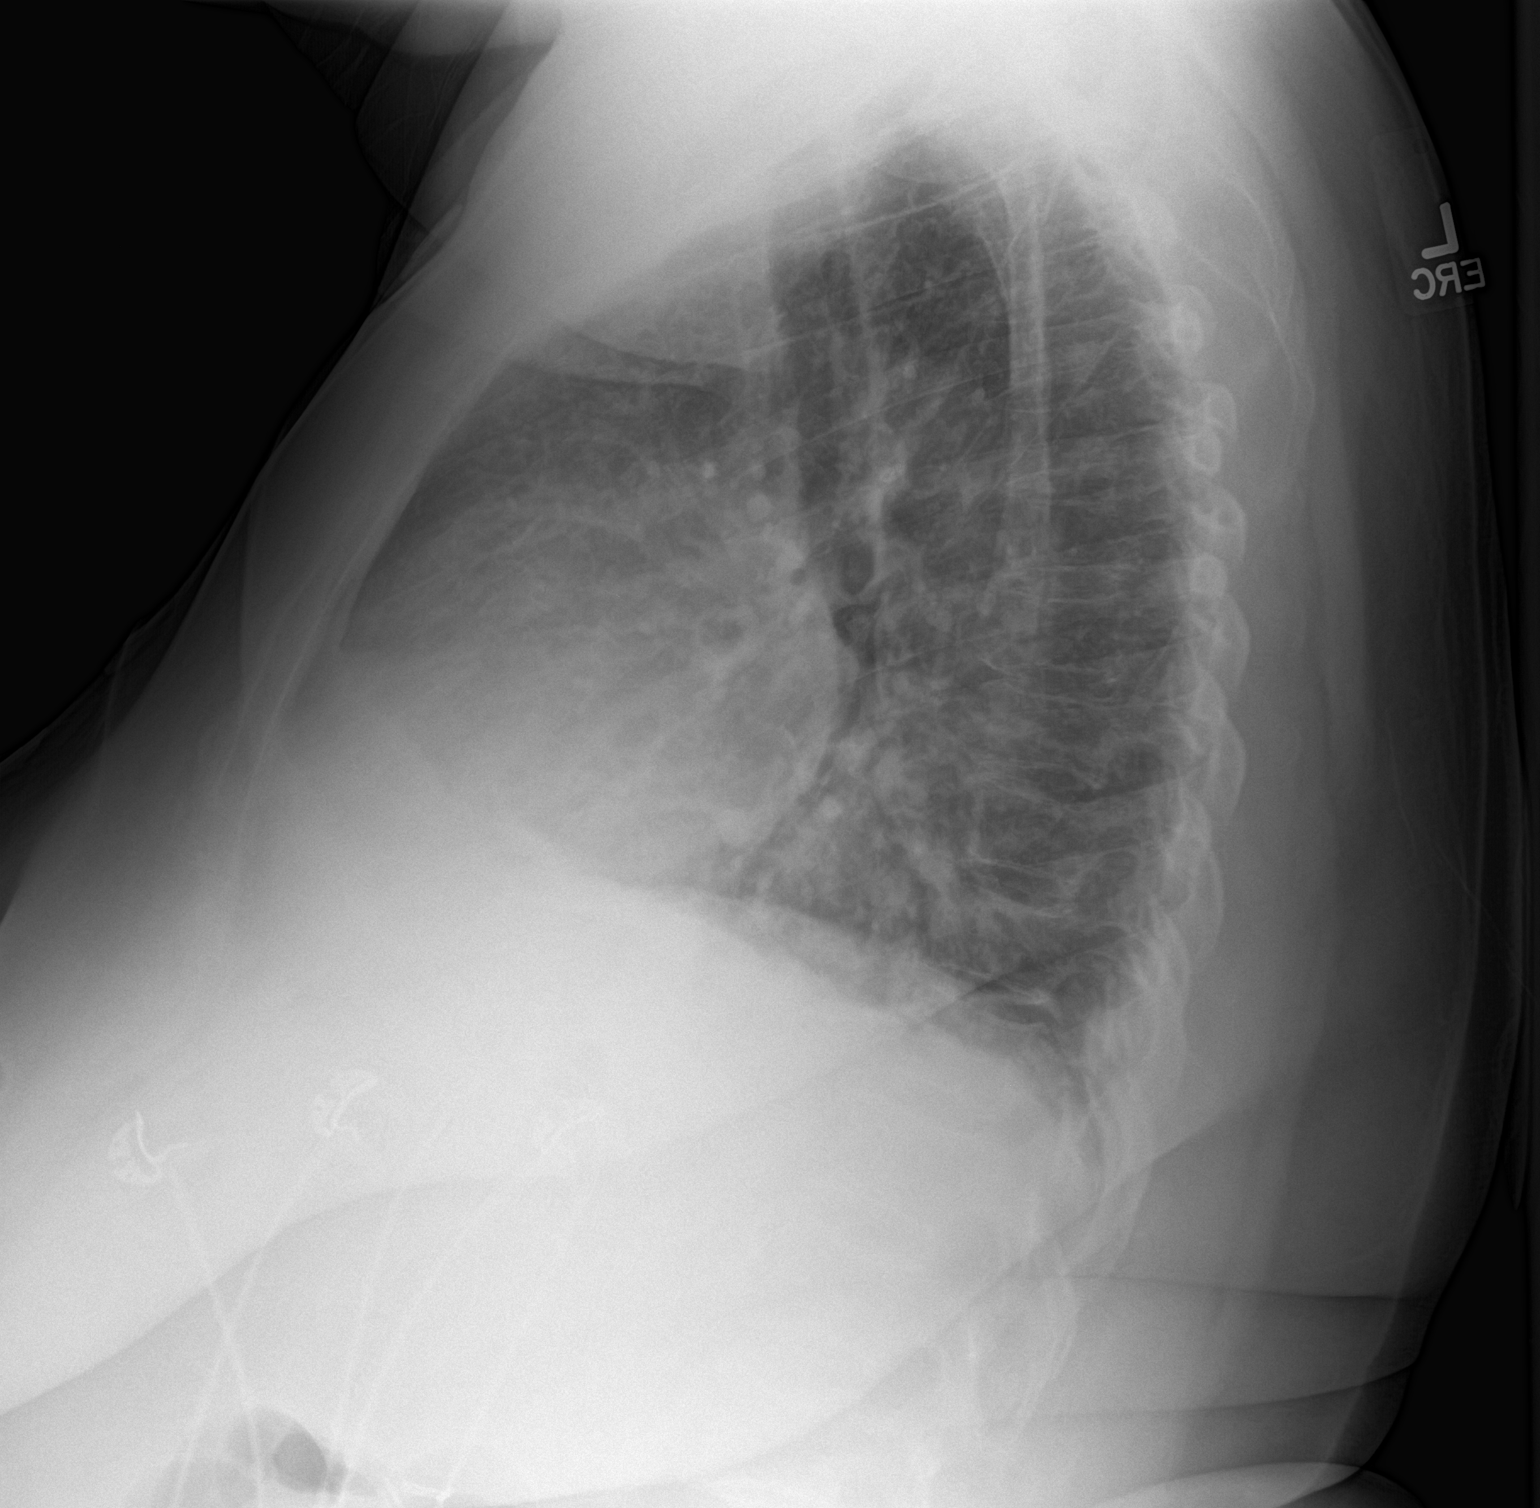

[2 of 2 positions shown; findings below may reference images not displayed]

FINDINGS: Linear atelectatic appearing opacities are present in the right
base. There is no confluent airspace consolidation. There is no
effusion. Hilar, mediastinal and cardiac contours are unremarkable
and unchanged.
IMPRESSION: Linear opacities in the right base. These may be atelectatic. No
confluent consolidation. No effusions.

## 2017-10-03 IMAGING — DX DG CHEST 1V
1 series · 1 of 1 positions shown · non-contrast
Comparison: Single view of the chest 10/22/2015. PA and lateral
chest 10/21/2015.

CLINICAL DATA: Chest pain.  Code sepsis.  Initial encounter.

EXAM:
CHEST 1 VIEW

[chest ap]
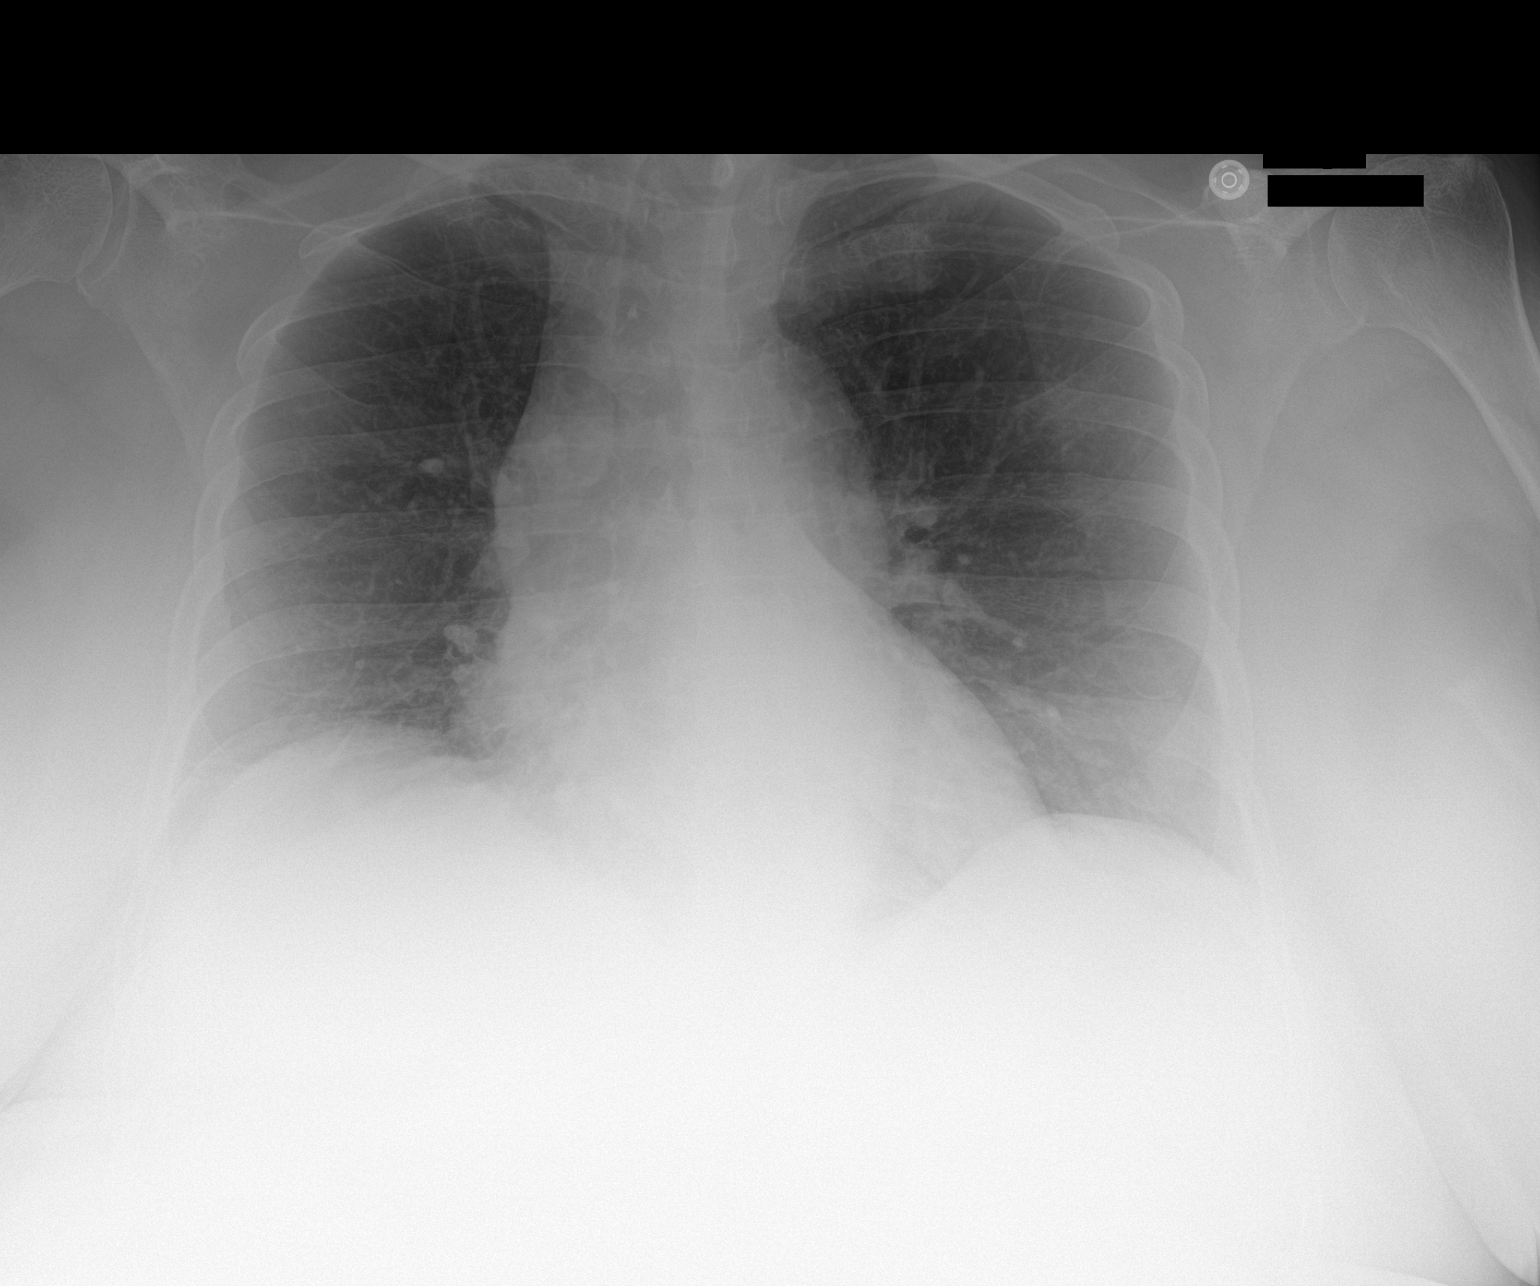

[1 of 1 positions shown; findings below may reference images not displayed]

FINDINGS: The lungs are clear. Heart size is normal. No pneumothorax or
pleural effusion. No focal bony abnormality.
IMPRESSION: Negative chest.

## 2017-10-03 IMAGING — NM NM PULMONARY VENT & PERF
2 series · 12 of 12 positions shown · non-contrast
Comparison: None

Radiographic correlation:  Chest radiograph 10/24/2015

CLINICAL DATA: Shortness of breath, remote history pulmonary
embolism 15 years ago, emphysema, chronic bronchitis, cirrhosis,
hypertension, diabetes mellitus, smoker

EXAM:
NUCLEAR MEDICINE VENTILATION - PERFUSION LUNG SCAN
TECHNIQUE: Ventilation images were obtained in multiple projections using
inhaled aerosol Pc-BBm DTPA. Perfusion images were obtained in
multiple projections after intravenous injection of Pc-BBm MAA.
Lateral images were not obtained on either the ventilation or
perfusion exams.
RADIOPHARMACEUTICALS:  31.63 mCi Yechnetium-PPm DTPA aerosol
inhalation and 4.1 mCi Yechnetium-PPm MAA IV

[Series 1000: ventilation · 3.90mm/px · 3 acquisitions, 6 frames shown]
[im 1/3]
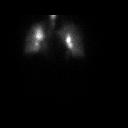
[im 1/3]
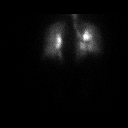
[im 2/3]
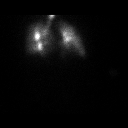
[im 2/3]
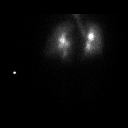
[im 3/3]
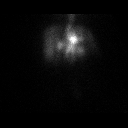
[im 3/3]
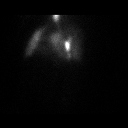

[Series 1000: perfusion · 1.95mm/px · 3 acquisitions, 6 frames shown]
[im 1/3]
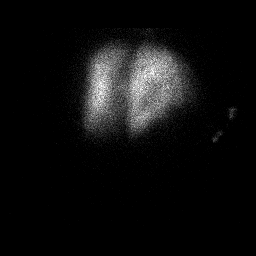
[im 1/3]
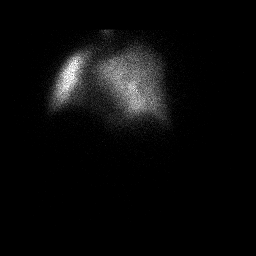
[im 2/3]
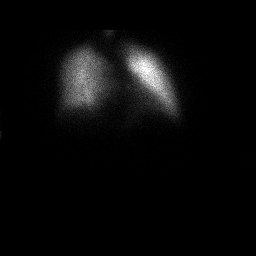
[im 2/3]
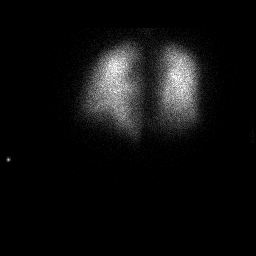
[im 3/3]
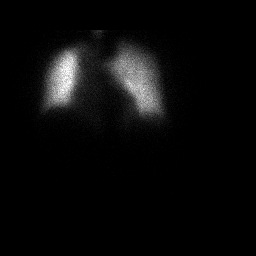
[im 3/3]
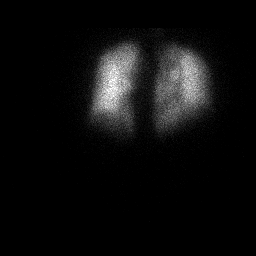

[12 of 12 positions shown; findings below may reference images not displayed]

FINDINGS: Ventilation: Extensive central airway deposition of tracer. No
definite focal ventilatory defects.

Perfusion: No segmental or subsegmental perfusion defects.

Chest radiograph:  No significant abnormalities.
IMPRESSION: Normal ventilation and perfusion lung scan.

## 2017-10-03 IMAGING — US US ABDOMEN LIMITED
1 series · 9 of 9 positions shown · non-contrast
Comparison: None.

CLINICAL DATA: LEFT upper quadrant pain for 1 month

EXAM:
LIMITED ABDOMINAL ULTRASOUND
TECHNIQUE: Limited sonography of the LEFT upper quadrant performed to assess
the spleen as requested.

[Series 1: us abdomen limited · 0.27mm/px · 9 of 9 slices shown]
[im 1/9]
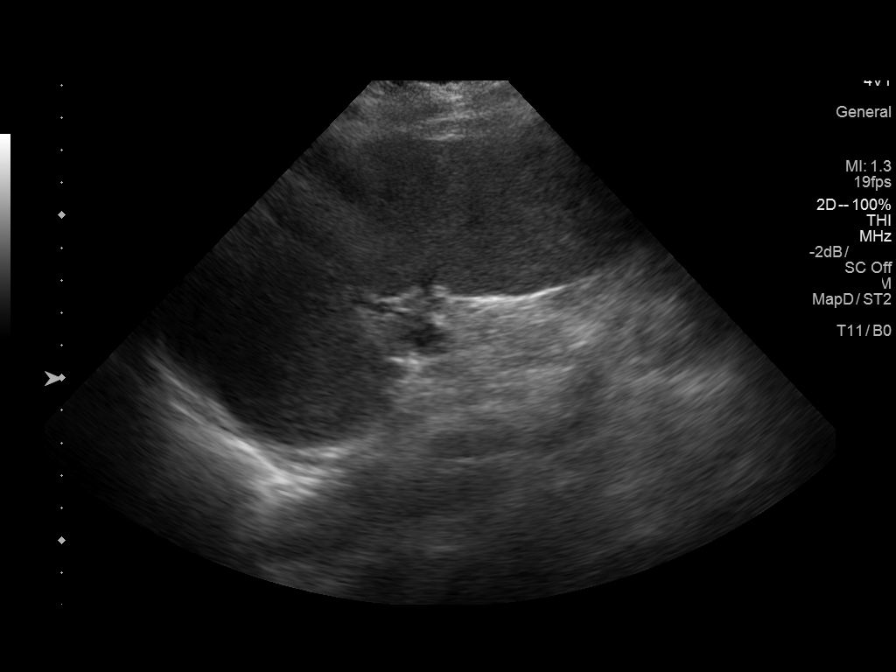
[im 2/9]
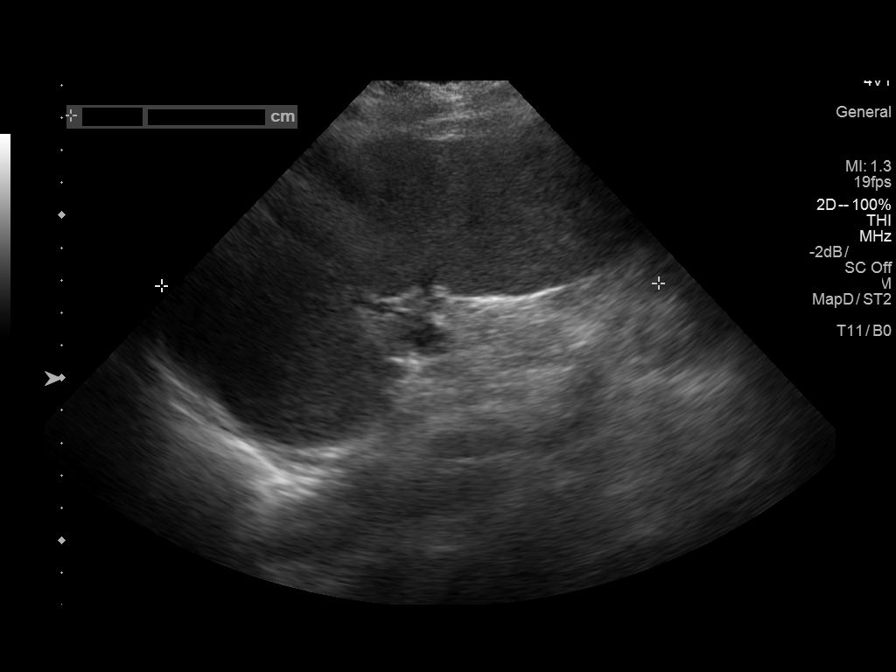
[im 3/9]
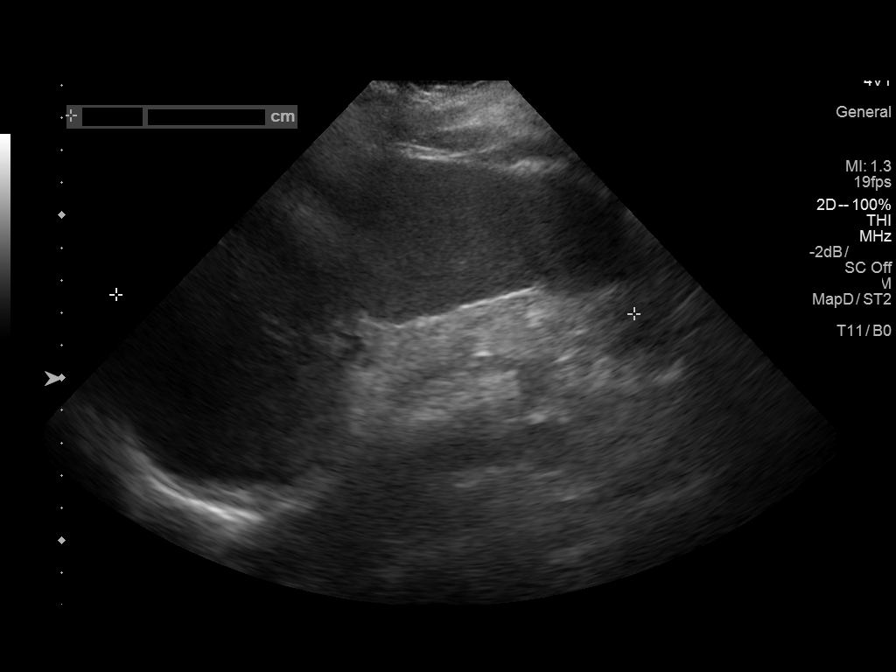
[im 4/9]
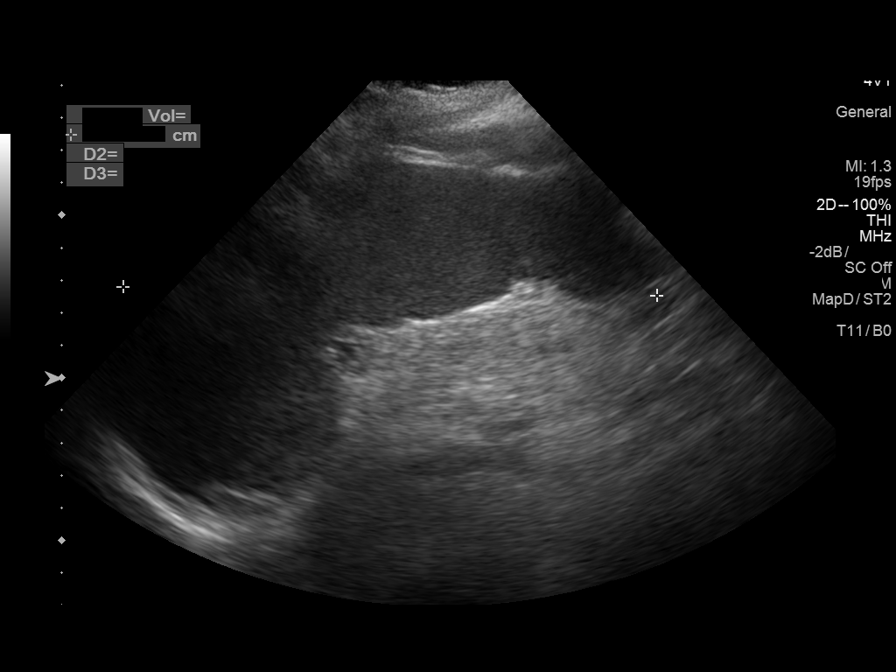
[im 5/9]
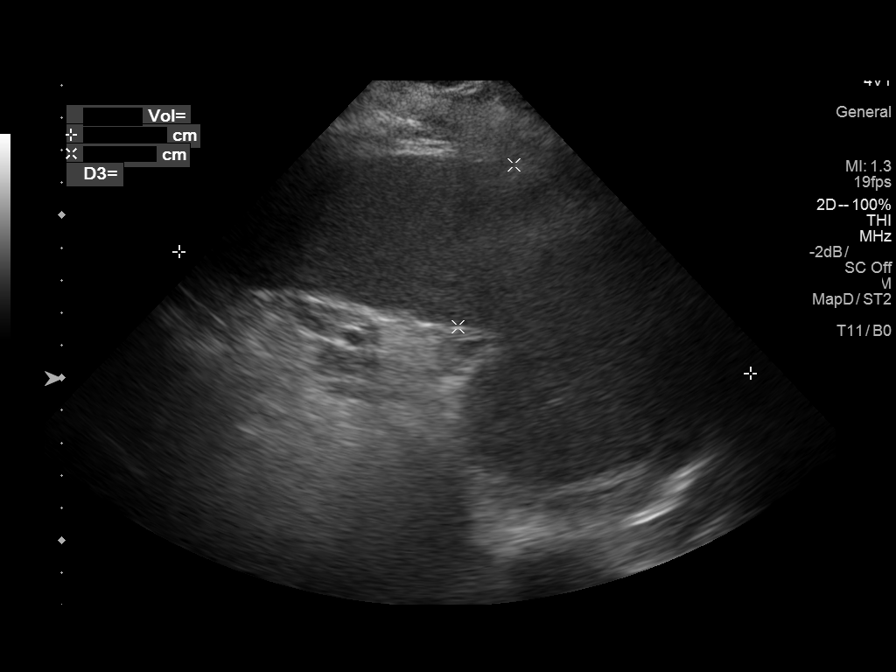
[im 6/9]
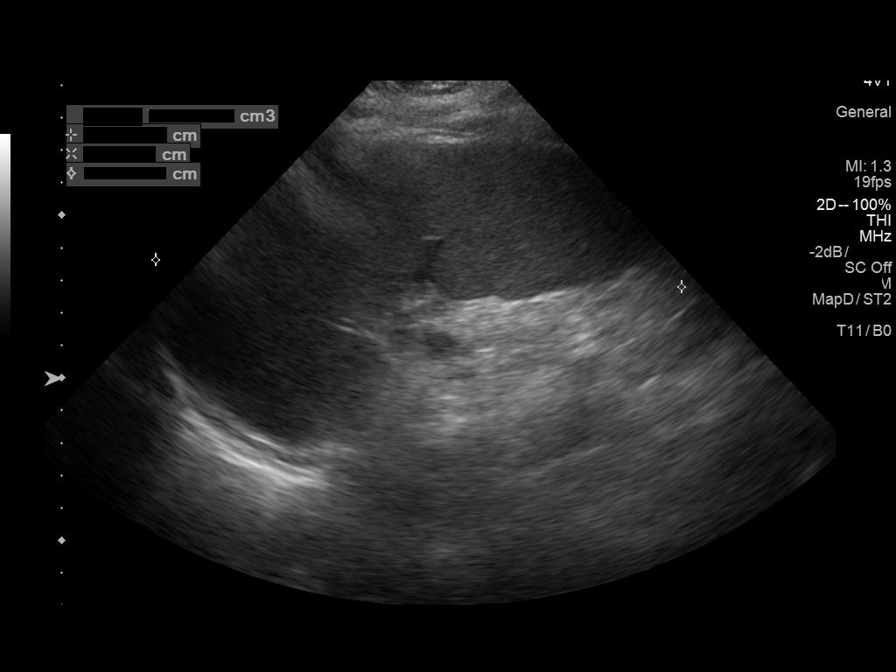
[im 7/9]
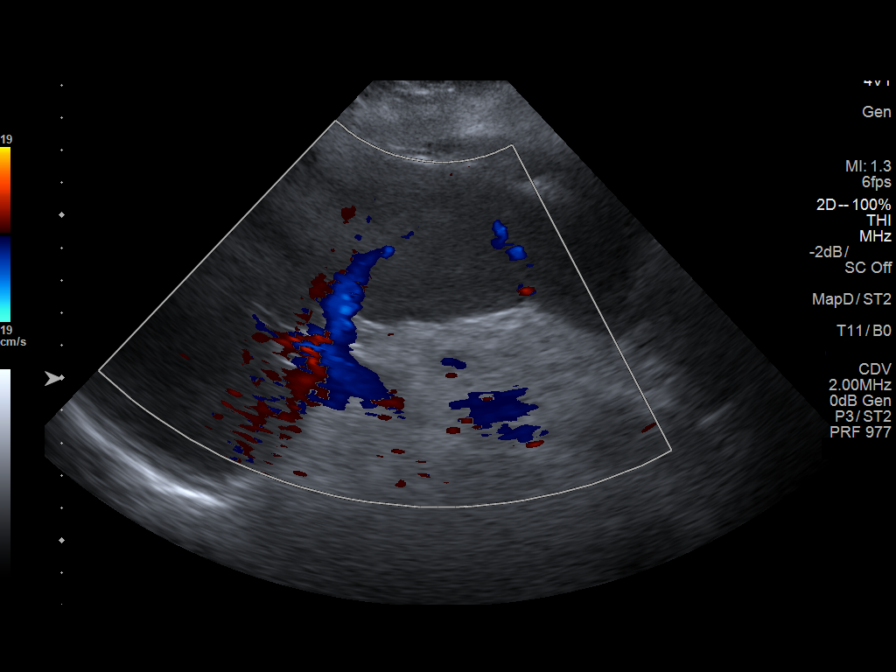
[im 8/9]
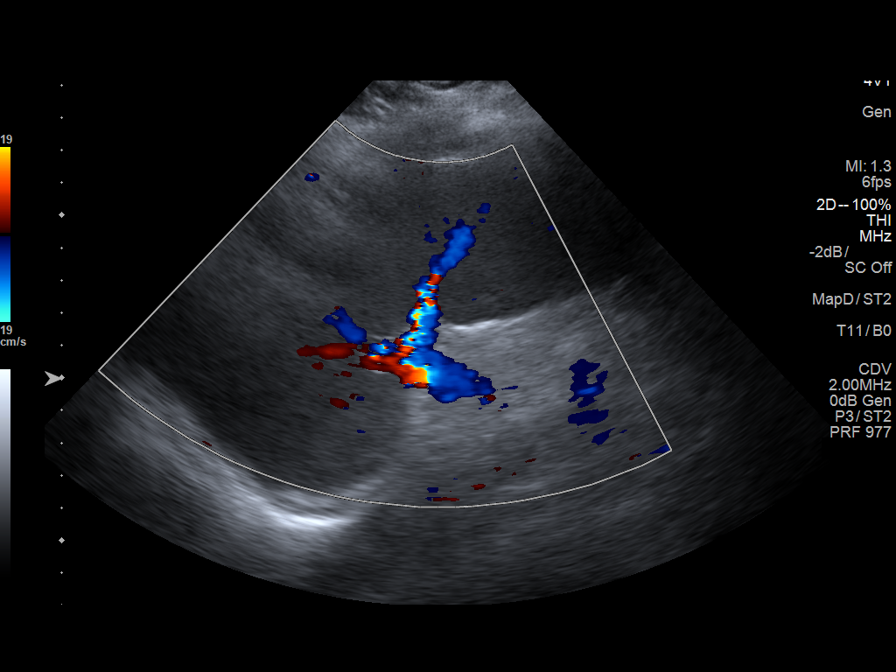
[im 9/9]
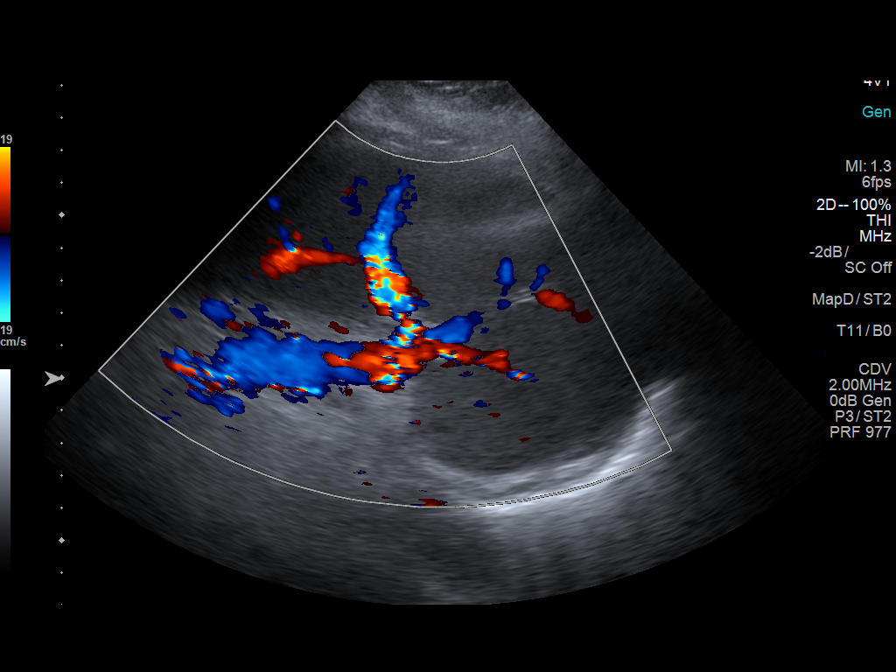

[9 of 9 positions shown; findings below may reference images not displayed]

FINDINGS: Spleen enlarged, measuring 16.4 cm length with a calculated volume
of 802 mL.

No focal splenic abnormalities identified.

No definite adjacent splenules or free intraperitoneal fluid in LEFT
upper quadrant.
IMPRESSION: Splenomegaly as above.

## 2017-12-13 IMAGING — DX DG CHEST 1V PORT
1 series · 1 of 1 positions shown · non-contrast
Comparison: 10/24/2015

CLINICAL DATA: Right arm PICC line insertion

EXAM:
PORTABLE CHEST 1 VIEW

[chest ap]
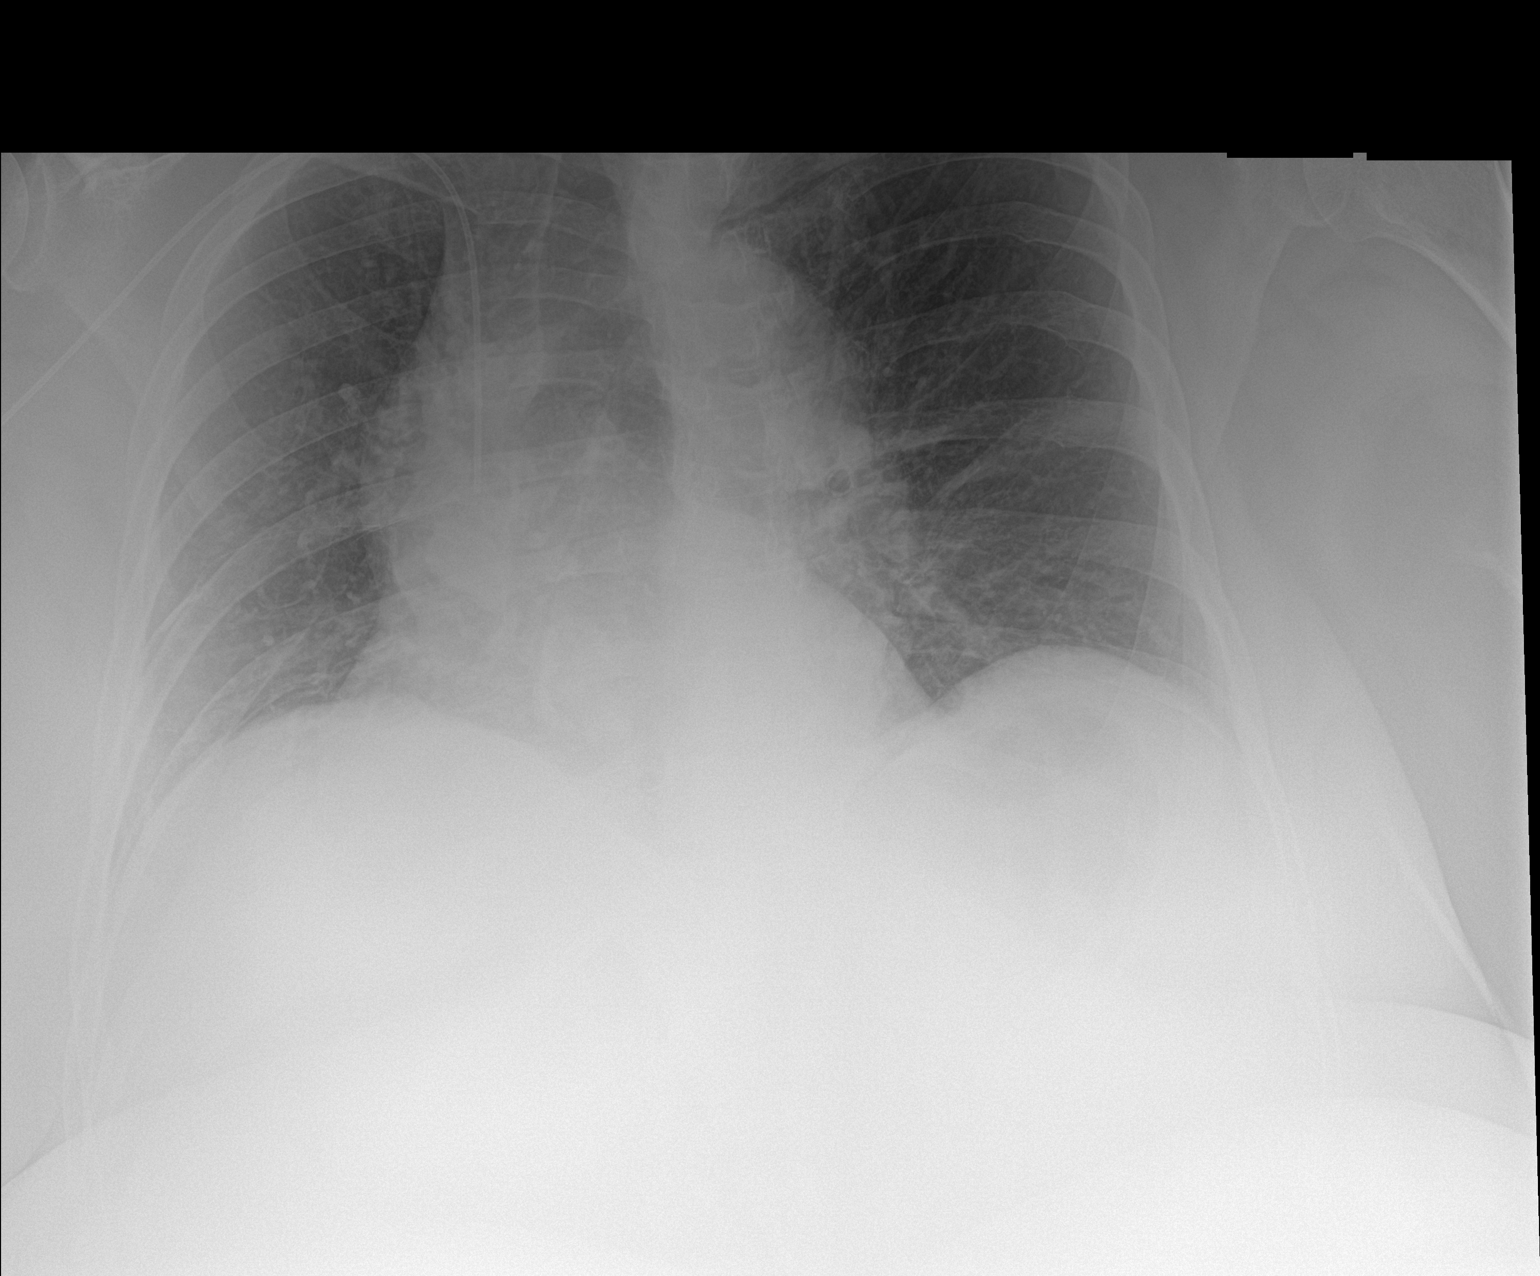

[1 of 1 positions shown; findings below may reference images not displayed]

FINDINGS: Cardiomediastinal silhouette is stable. There is a right arm PICC
line with tip in SVC right atrium junction. No pneumothorax. No
infiltrate or pulmonary edema.
IMPRESSION: Right arm PICC line with tip in SVC right atrium junction. No
pneumothorax.

## 2017-12-27 IMAGING — DX DG CHEST 1V PORT
1 series · 1 of 1 positions shown · non-contrast
Comparison: 01/03/2016

CLINICAL DATA: Generalized weakness.  Emphysema.

EXAM:
PORTABLE CHEST 1 VIEW

[chest ap]
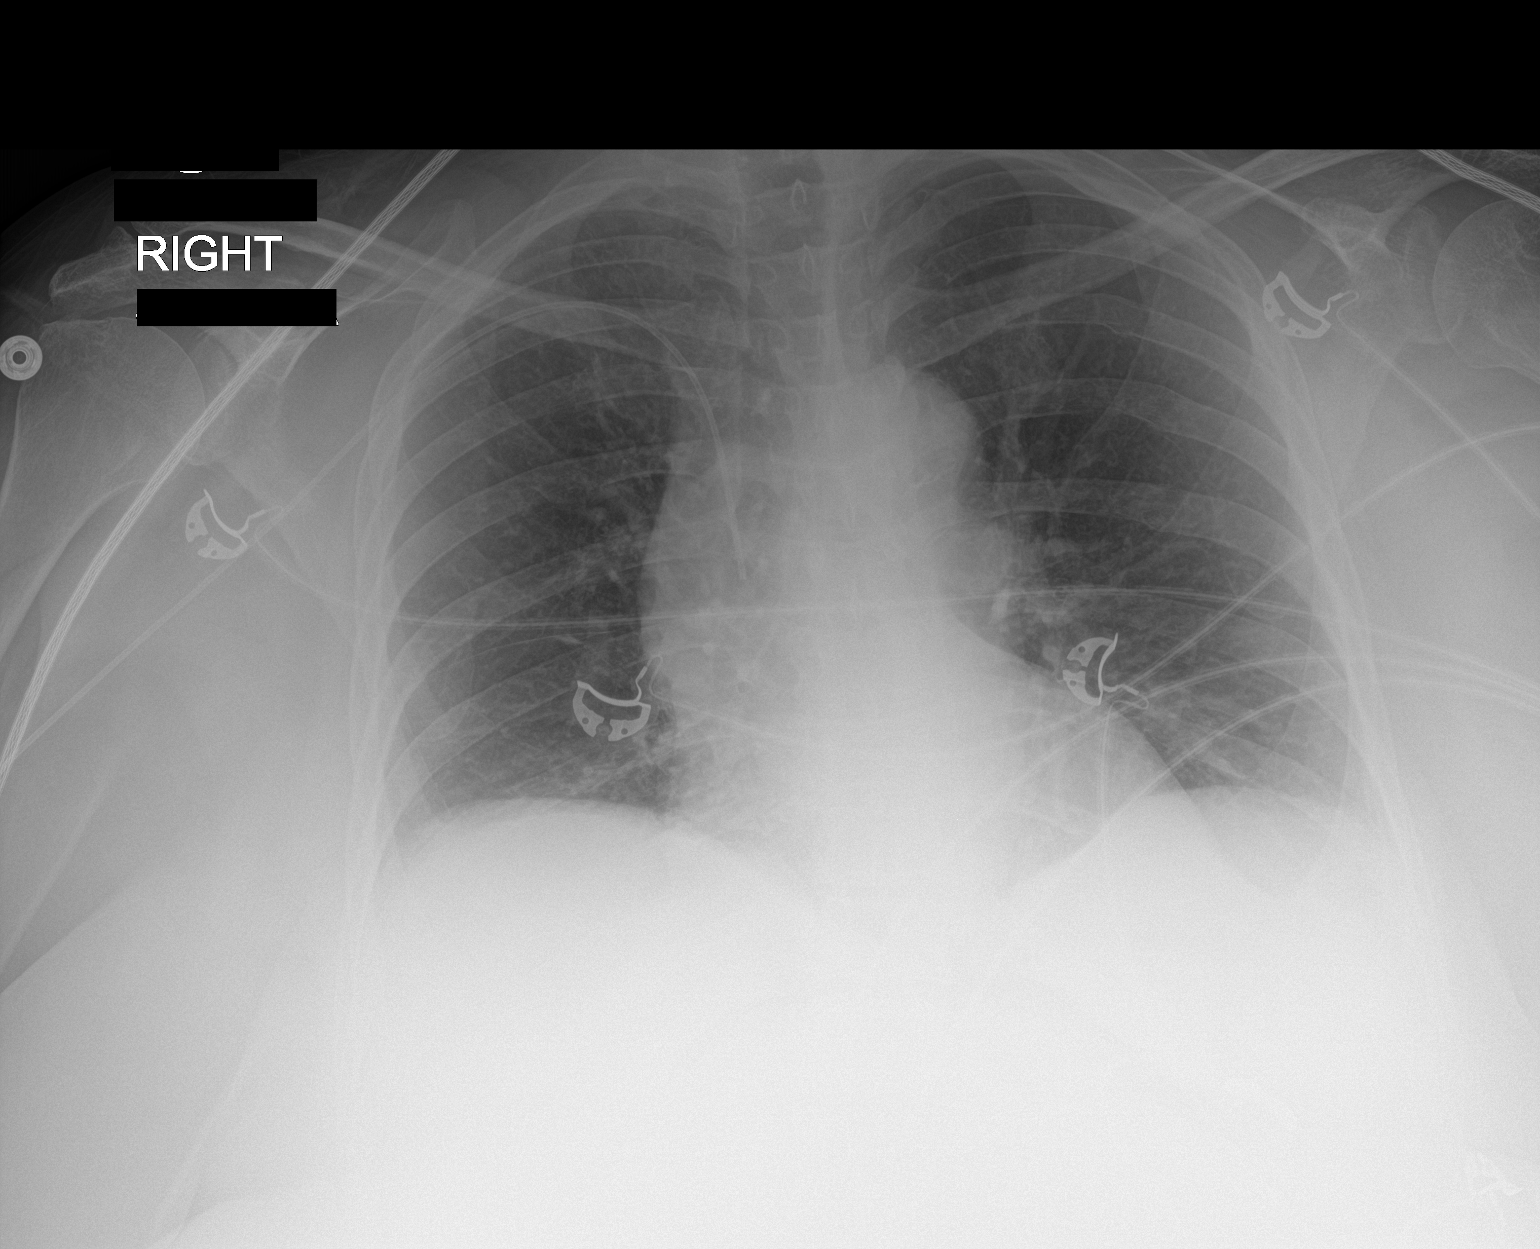

[1 of 1 positions shown; findings below may reference images not displayed]

FINDINGS: 5886 hours. Lung volumes are low. Basilar atelectasis noted. The
lungs are otherwise clear wiithout focal pneumonia, edema,
pneumothorax or pleural effusion. Cardiopericardial silhouette is at
upper limits of normal for size. Right PICC line tip overlies the
distal SVC. Telemetry leads overlie the chest.
IMPRESSION: Bibasilar atelectasis.

## 2017-12-27 IMAGING — CT CT HEAD W/O CM
5 series · 17 of 47 positions shown, 19 images · non-contrast
Comparison: 10/20/2015

CLINICAL DATA: Weakness, drowsy today, unintentional 8 took too
much medication today, abdominal pain, unable to keep food down,
altered mental status, history brain tumor, emphysema, cirrhosis,
diabetes mellitus

EXAM:
CT HEAD WITHOUT CONTRAST
TECHNIQUE: Contiguous axial images were obtained from the base of the skull
through the vertex without intravenous contrast.

[Series 2: soft tissue · axial · 0.42mm/px · z∈[-73,+12]mm · 4 of 29 slices shown]
[im 6/29  brain]
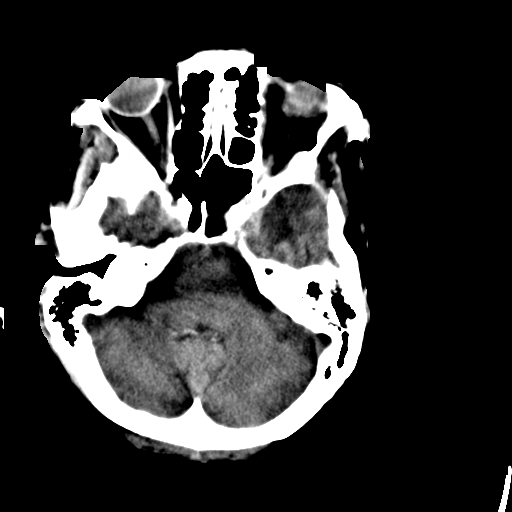
[im 12/29  brain]
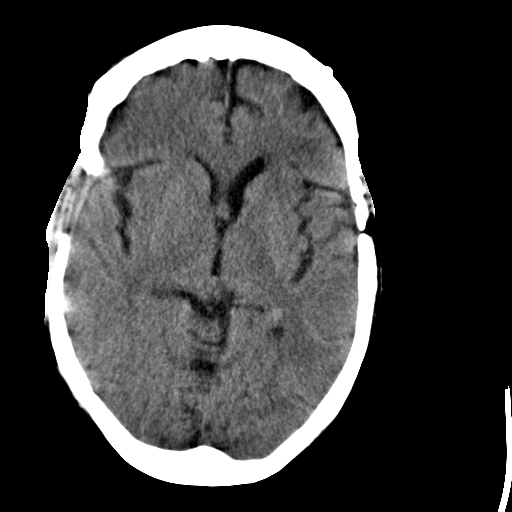
[im 17/29  brain]
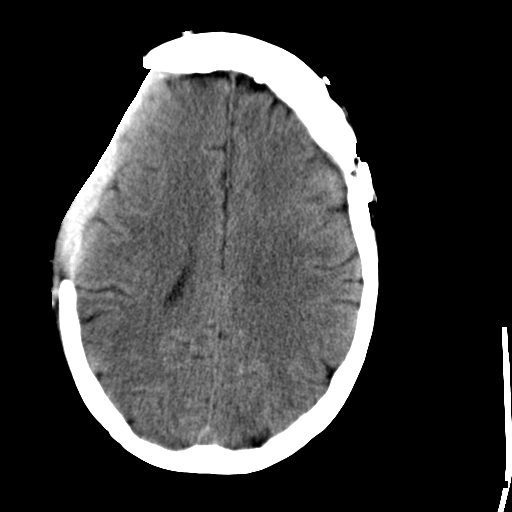
[im 23/29  brain]
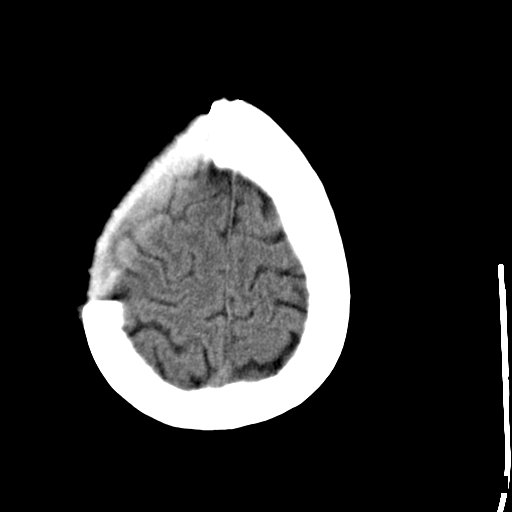

[Series 602: coronals · coronal · 0.43mm/px · 3 of 108 slices shown]
[im 61/108  brain]
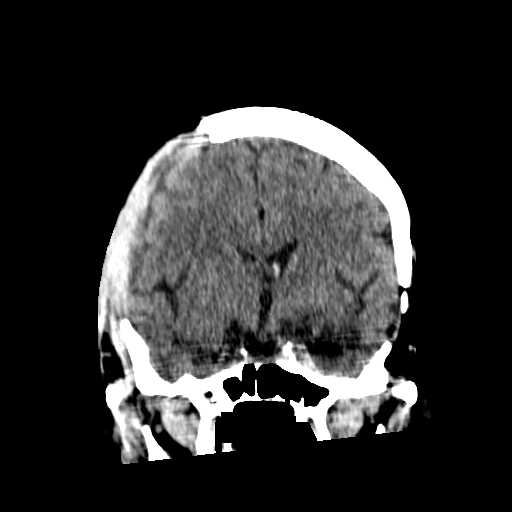
[im 69/108  brain]
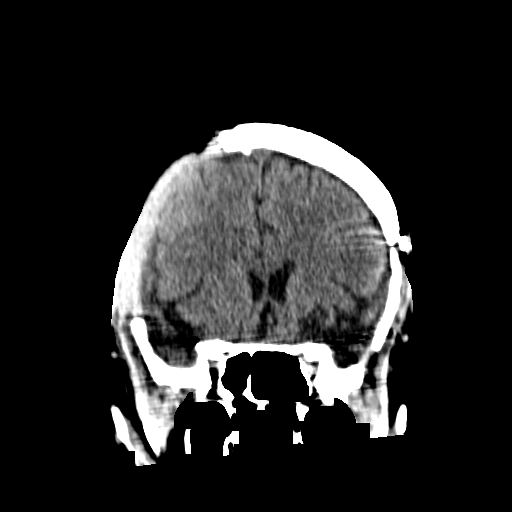
[im 76/108  brain]
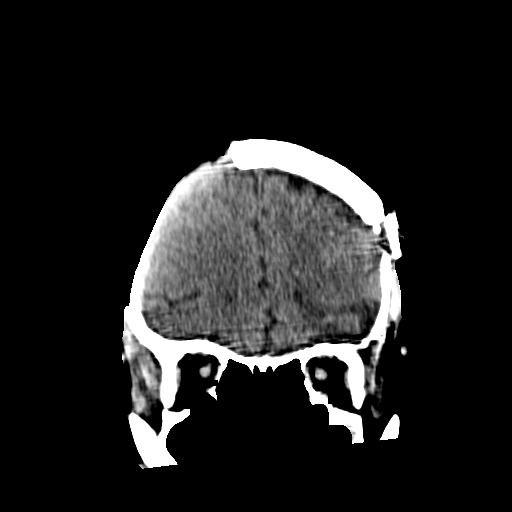

[Series 603: sagittals · sagittal · 0.43mm/px · 3 of 84 slices shown]
[im 67/84  brain]
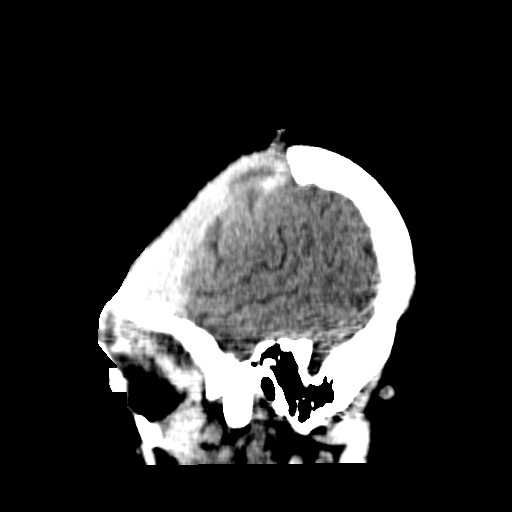
[im 71/84  brain]
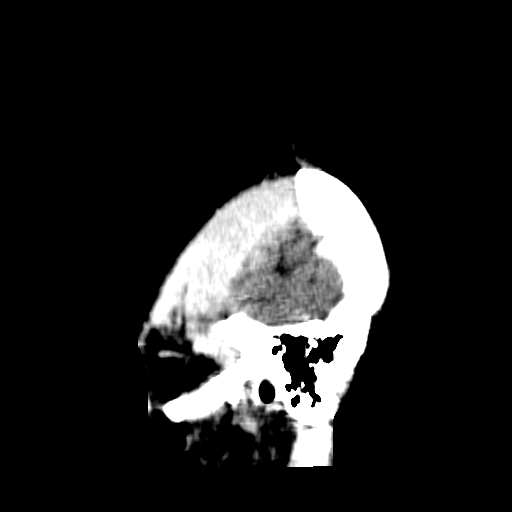
[im 75/84  brain]
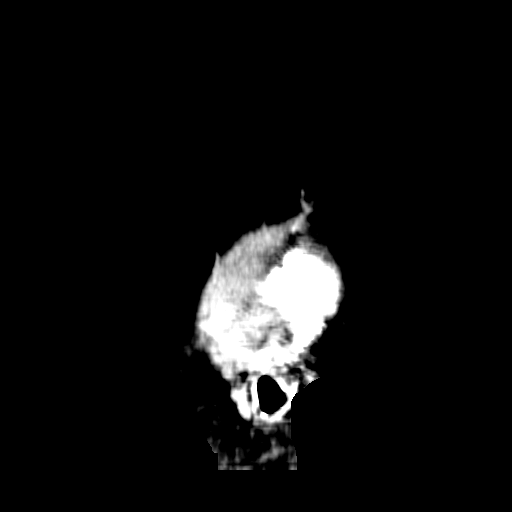

[Series 604: axial mpr st · axial · 0.43mm/px · z∈[-58,+36]mm · 5 of 29 slices shown, 7 images]
[im 5/29  brain]
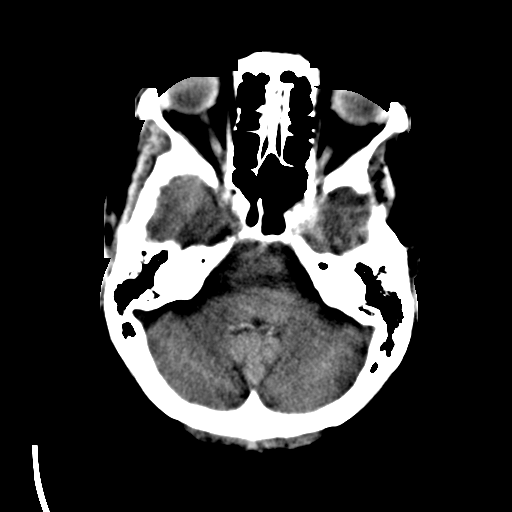
[im 5/29  bone]
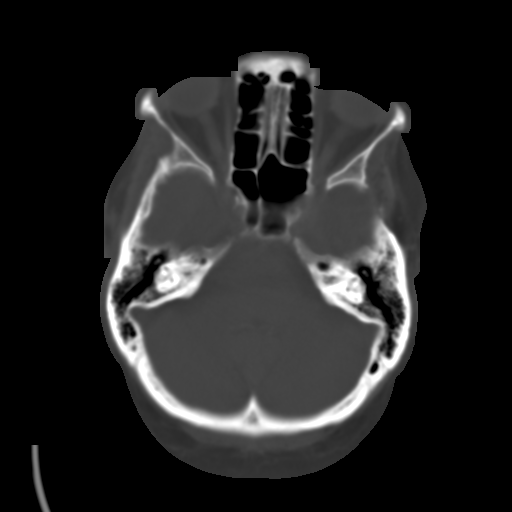
[im 10/29  brain]
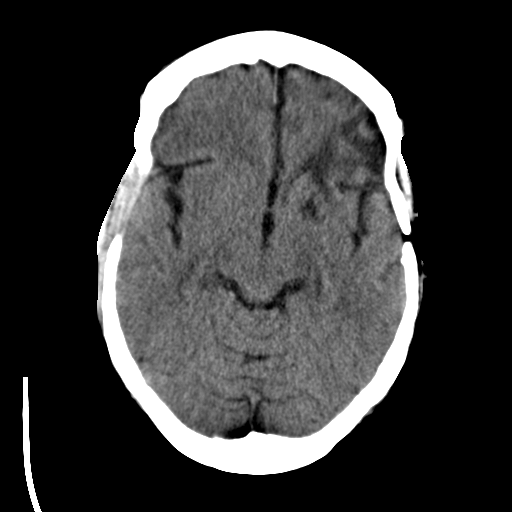
[im 15/29  brain]
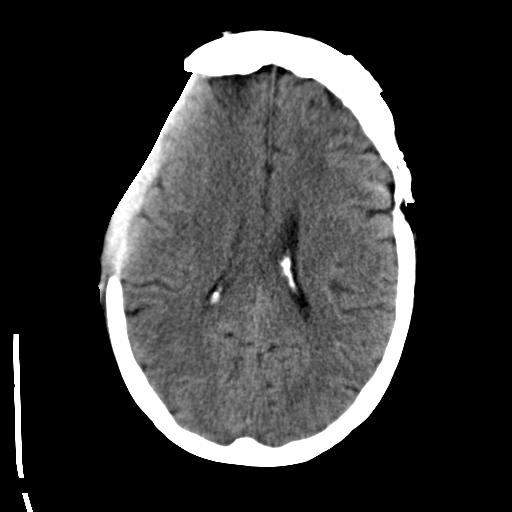
[im 19/29  brain]
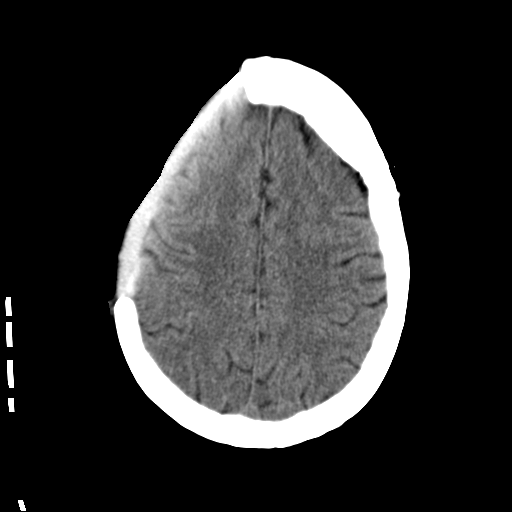
[im 24/29  brain]
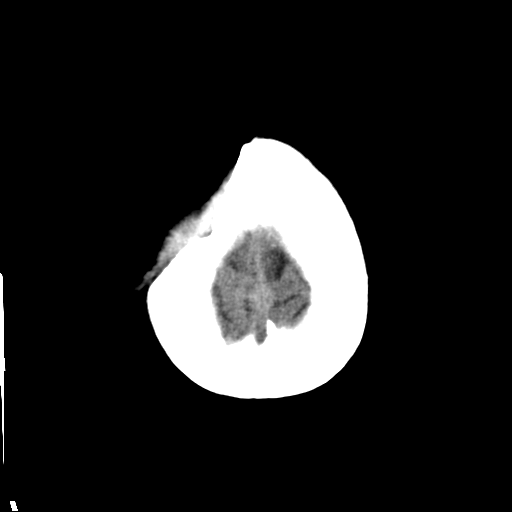
[im 24/29  bone]
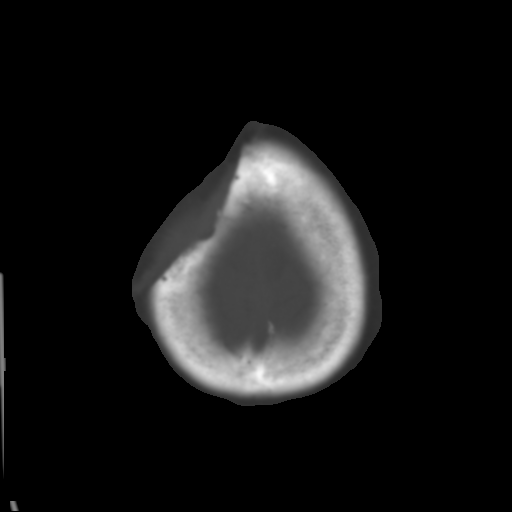

[Series 605: axial bone mpr · axial · 0.42mm/px · z∈[-67,-49]mm · 2 of 72 slices shown]
[im 5/72  bone]
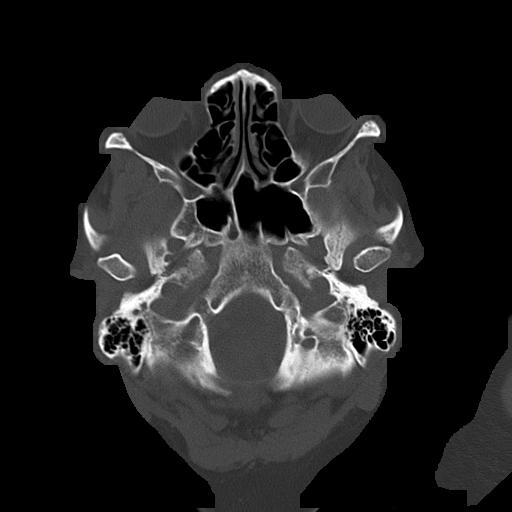
[im 14/72  bone]
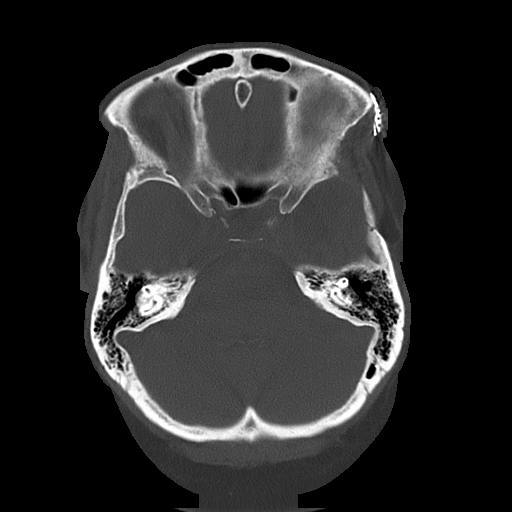

[17 of 47 positions shown; findings below may reference images not displayed]

FINDINGS: Prior RIGHT frontotemporoparietal craniotomy with interval resection
of the bone flap and interval cranioplasty.

Old LEFT frontotemporal craniotomy.

Normal ventricular morphology.

No midline shift or mass effect.

Encephalomalacia at anterior LEFT temporal lobe.

Otherwise normal appearance of brain parenchyma.

No intracranial hemorrhage, definite mass lesion, or evidence acute
infarction.

No extra-axial fluid collections.

Visualized paranasal sinuses and mastoid air cells clear.

No acute osseous findings.
IMPRESSION: Old BILATERAL craniotomies with interval resection of RIGHT bone
flap and RIGHT side cranioplasty.

Encephalomalacia at the anterior aspect of the LEFT temporal lobe
likely from prior resection.

No acute intracranial abnormalities.

## 2017-12-30 IMAGING — DX DG CHEST 2V
2 series · 2 of 2 positions shown · non-contrast
Comparison: Radiographs January 17, 2016.

CLINICAL DATA: Malfunctioning PICC line.

EXAM:
CHEST  2 VIEW

[chest pa]
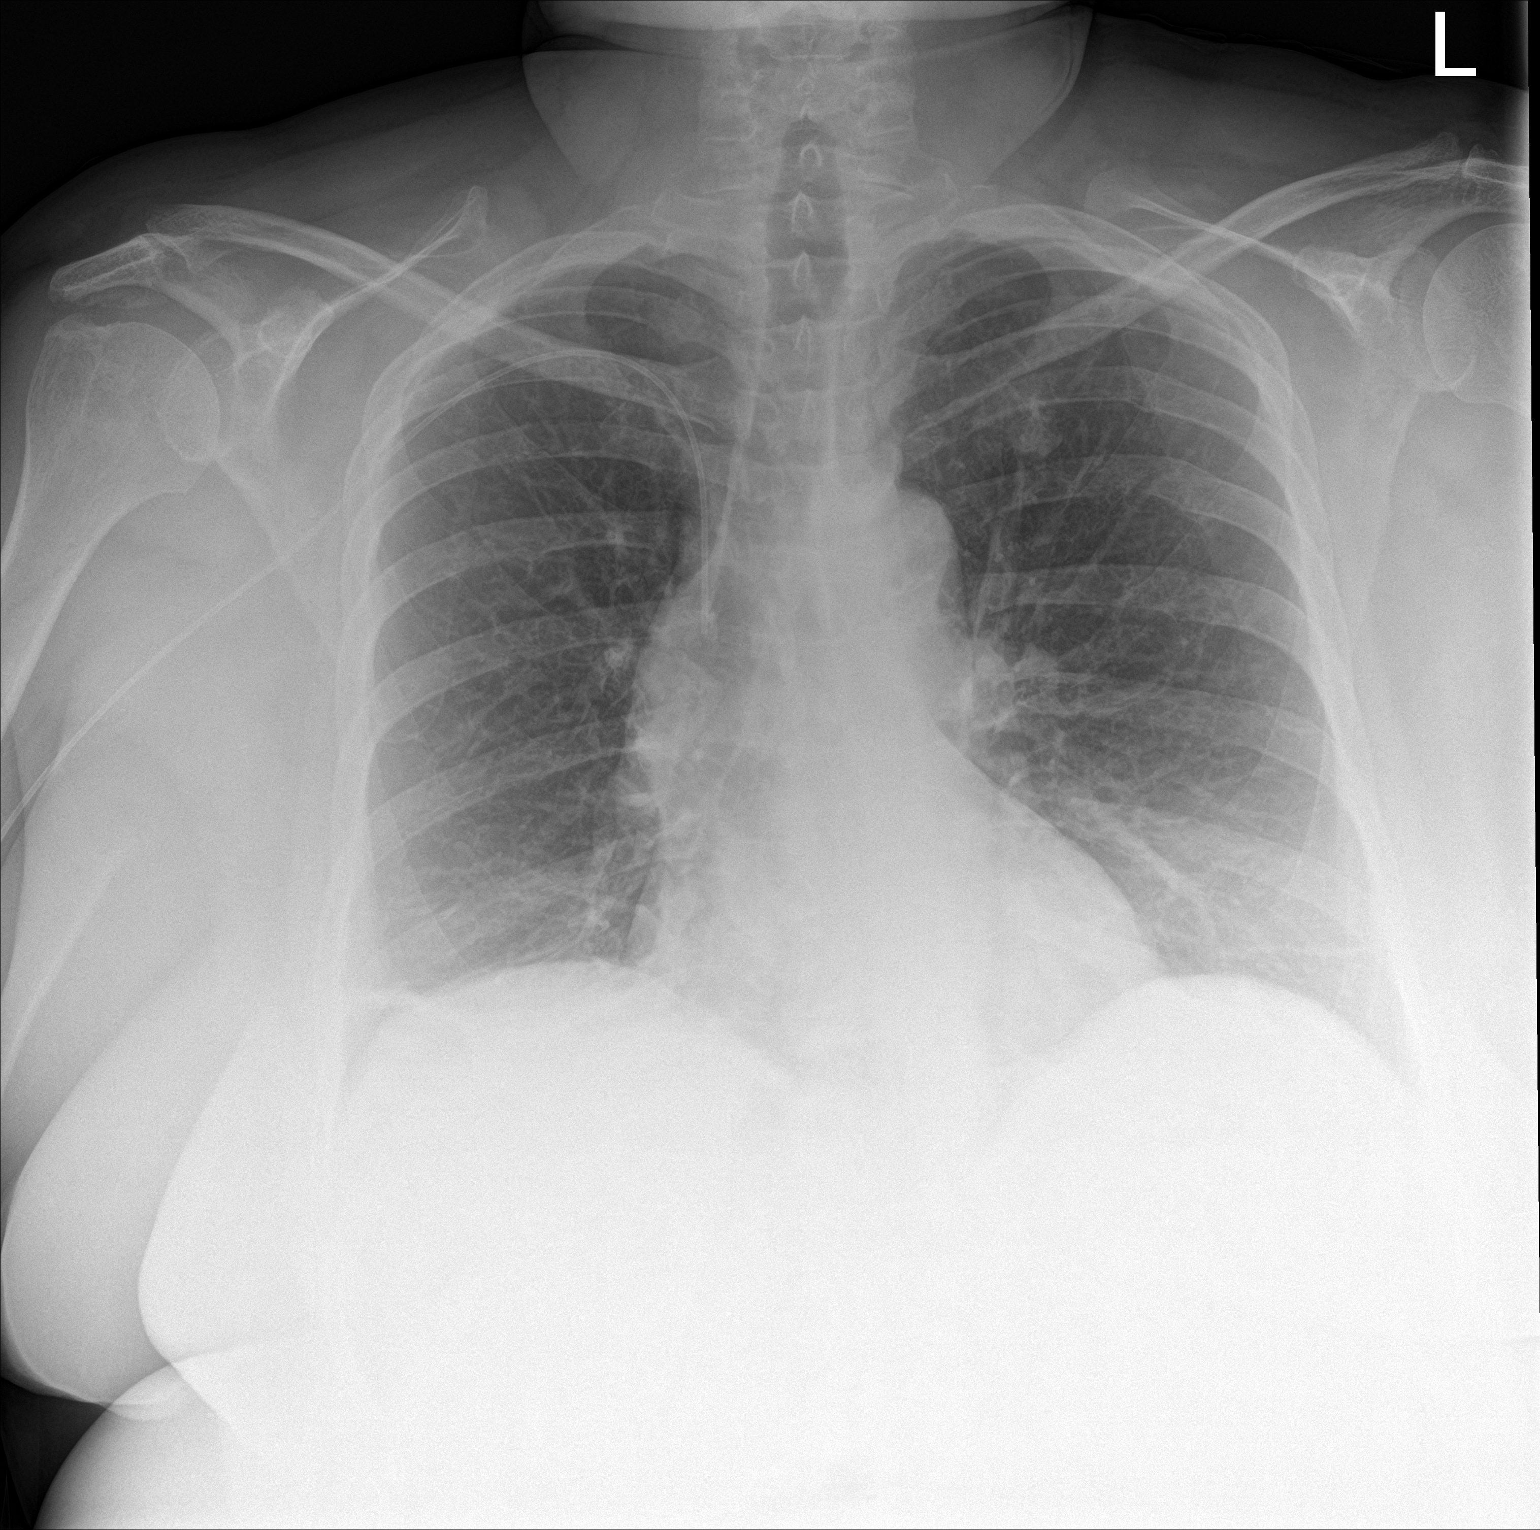

[chest lat]
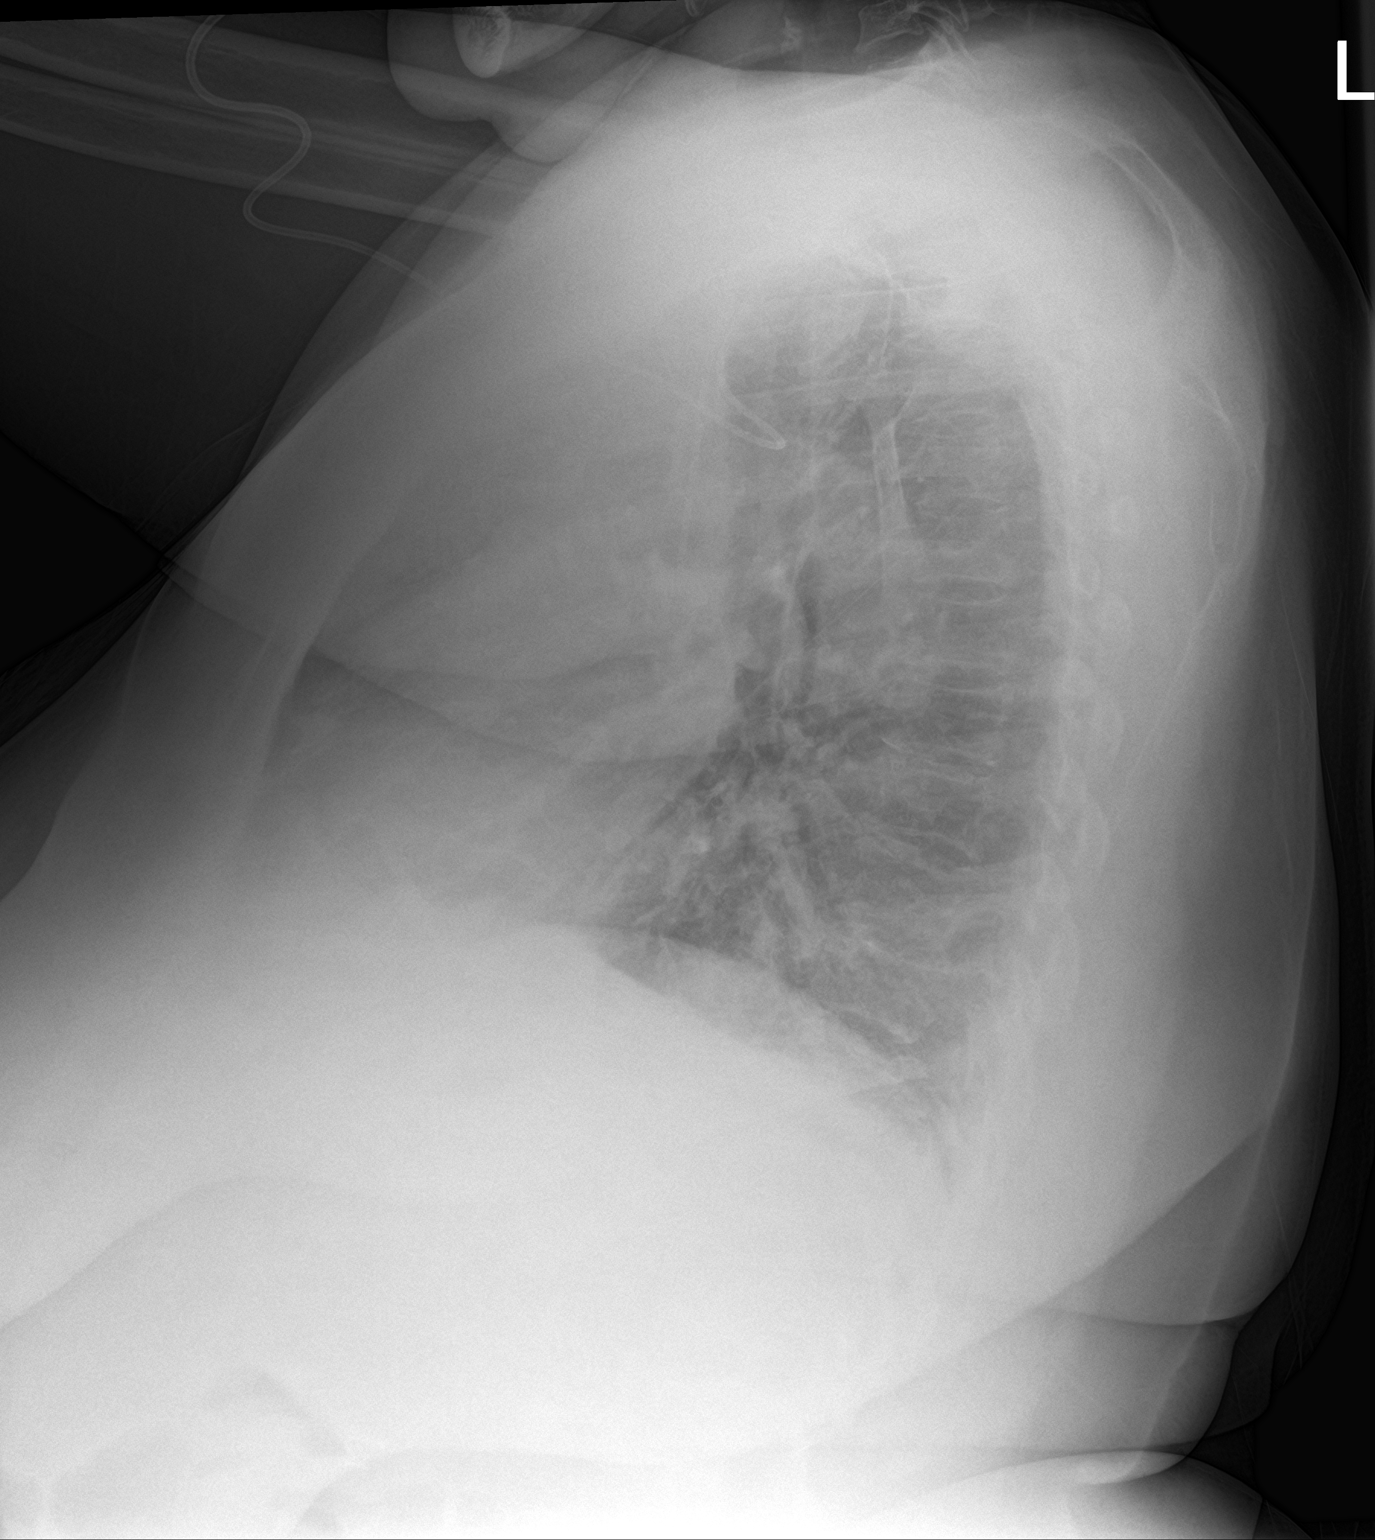

[2 of 2 positions shown; findings below may reference images not displayed]

FINDINGS: The heart size and mediastinal contours are within normal limits. No
pneumothorax or pleural effusion is noted. Left lung is clear.
Minimal right basilar subsegmental atelectasis is noted. Right-sided
PICC line is unchanged in position with distal tip in expected
position of the SVC. The visualized skeletal structures are
unremarkable.
IMPRESSION: Minimal right basilar subsegmental atelectasis. Right-sided PICC
line is unchanged in position with distal tip in expected position
of SVC.

## 2018-01-12 IMAGING — DX DG CHEST 1V
1 series · 1 of 1 positions shown · non-contrast
Comparison: 01/20/2016

CLINICAL DATA: Temporary CVC line placement.

EXAM:
CHEST 1 VIEW

[chest ap]
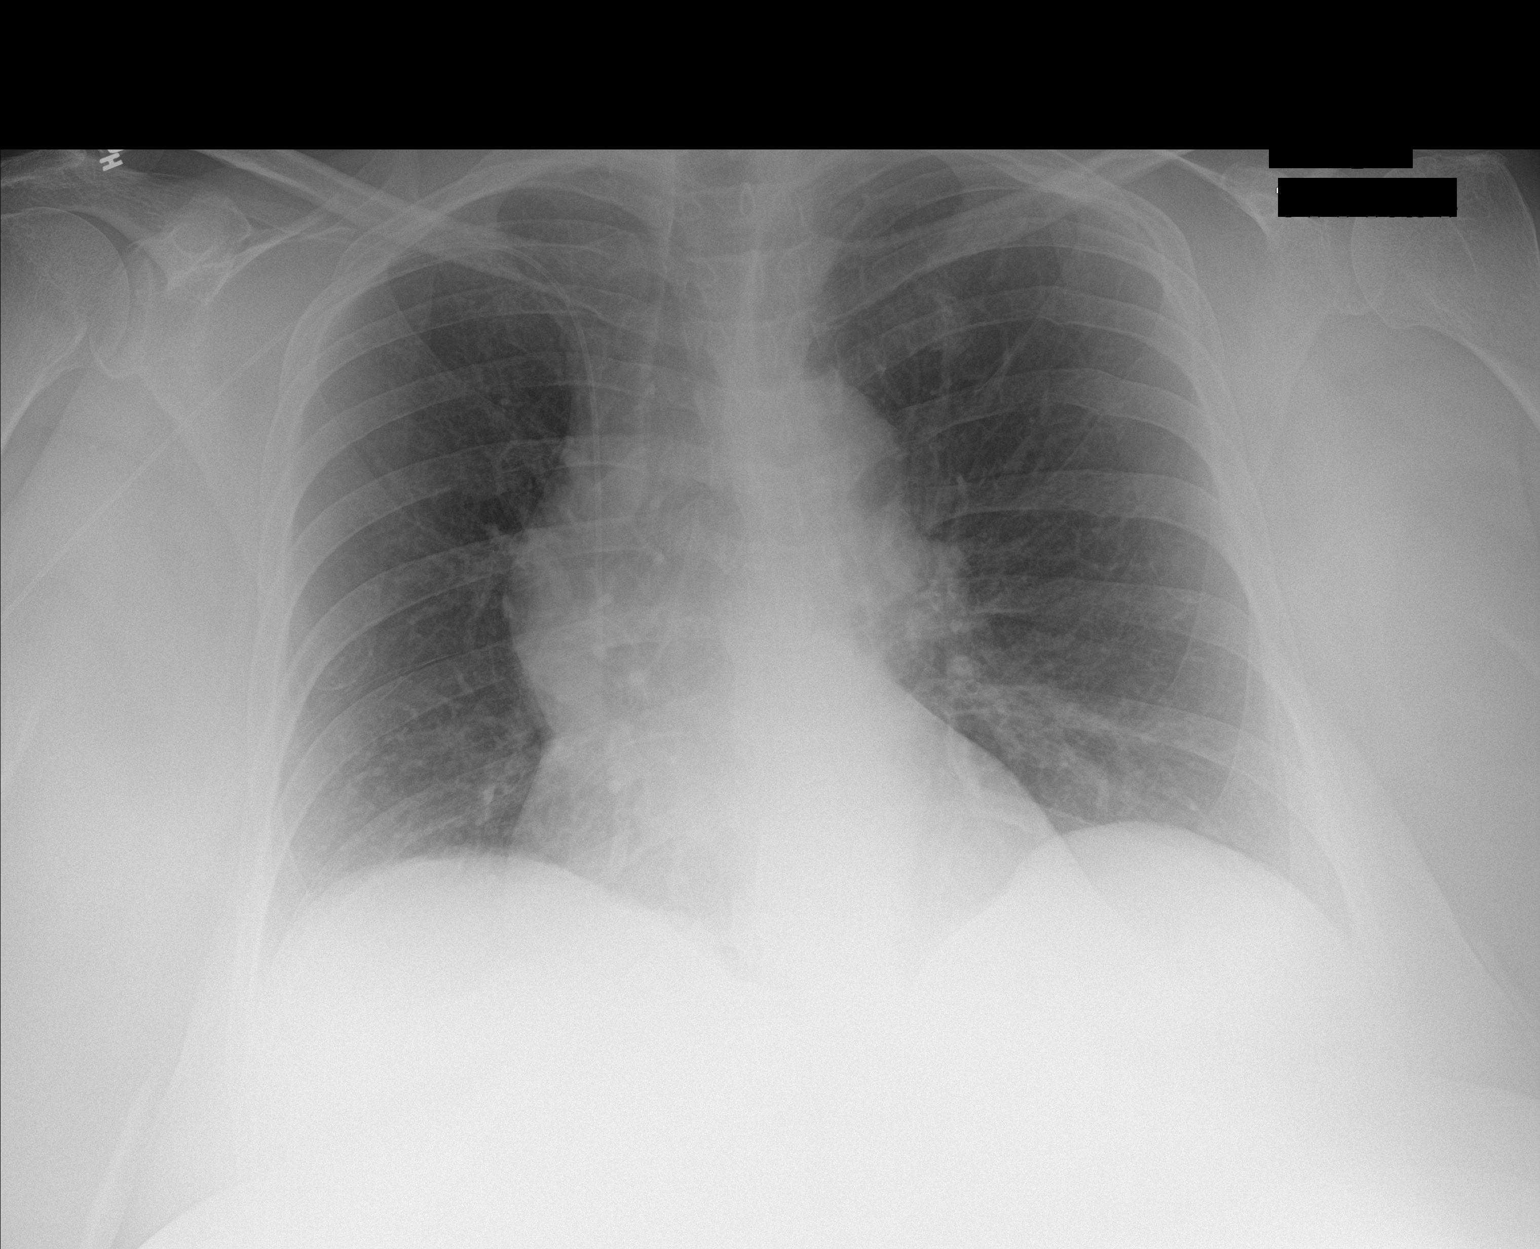

[1 of 1 positions shown; findings below may reference images not displayed]

FINDINGS: Right PICC line terminates at the expected location of cavoatrial
junction.

The cardiac silhouette is normal. There is abnormal prominence of
the right hilum more than left hilum.

There is no evidence of focal airspace consolidation, pleural
effusion or pneumothorax.

Osseous structures are without acute abnormality. Soft tissues are
grossly normal.
IMPRESSION: Right PICC line with tip at the cavoatrial junction.

Abnormal prominence of right more than left hilar. This may
represent overlapping vascular shadows, however mediastinal/hilar
masses or lymphadenopathy cannot be excluded. CT of the chest with
contrast may be considered if further imaging evaluation if desired.

## 2018-02-03 IMAGING — DX DG CHEST 1V PORT
1 series · 1 of 1 positions shown · non-contrast
Comparison: 02/02/2016

CLINICAL DATA: Recent neurological infection. Nosebleeds. Nausea,
vomiting and headaches.

EXAM:
PORTABLE CHEST 1 VIEW

[chest ap]
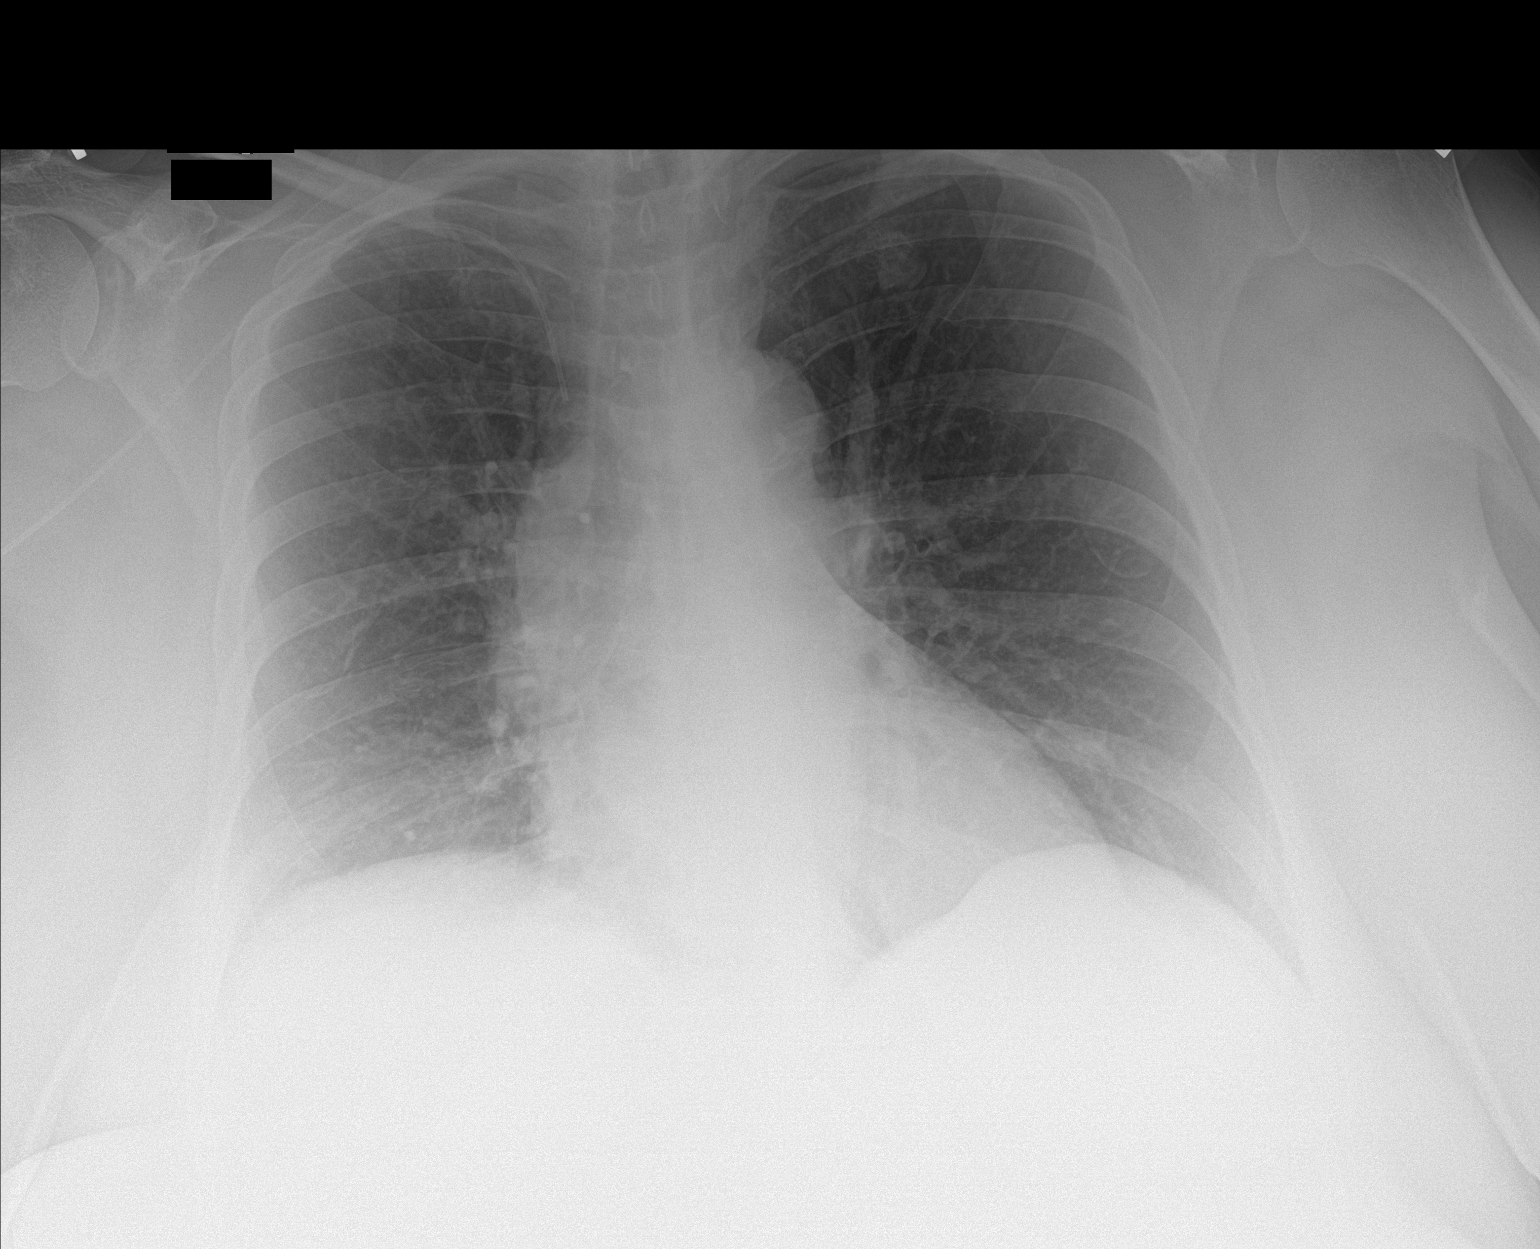

[1 of 1 positions shown; findings below may reference images not displayed]

FINDINGS: Right arm PICC is in place with the tip in the SVC at the azygos
level. Previously this was at the SVC RA junction. The lungs are
clear. The vascularity is normal. No effusions. No bony
abnormalities.
IMPRESSION: No active cardiopulmonary disease. Right arm PICC tip in the SVC at
the azygos level, previously seen at the SVC RA junction.

## 2018-02-05 IMAGING — CR DG CHEST 2V
1 series · 2 of 2 positions shown · non-contrast
Comparison: 02/24/2016

CLINICAL DATA: Subjective fever, headache

EXAM:
CHEST  2 VIEW

[Series 1: dg chest 2 view · 0.14mm/px · 2 of 2 slices shown]
[im 1/2]
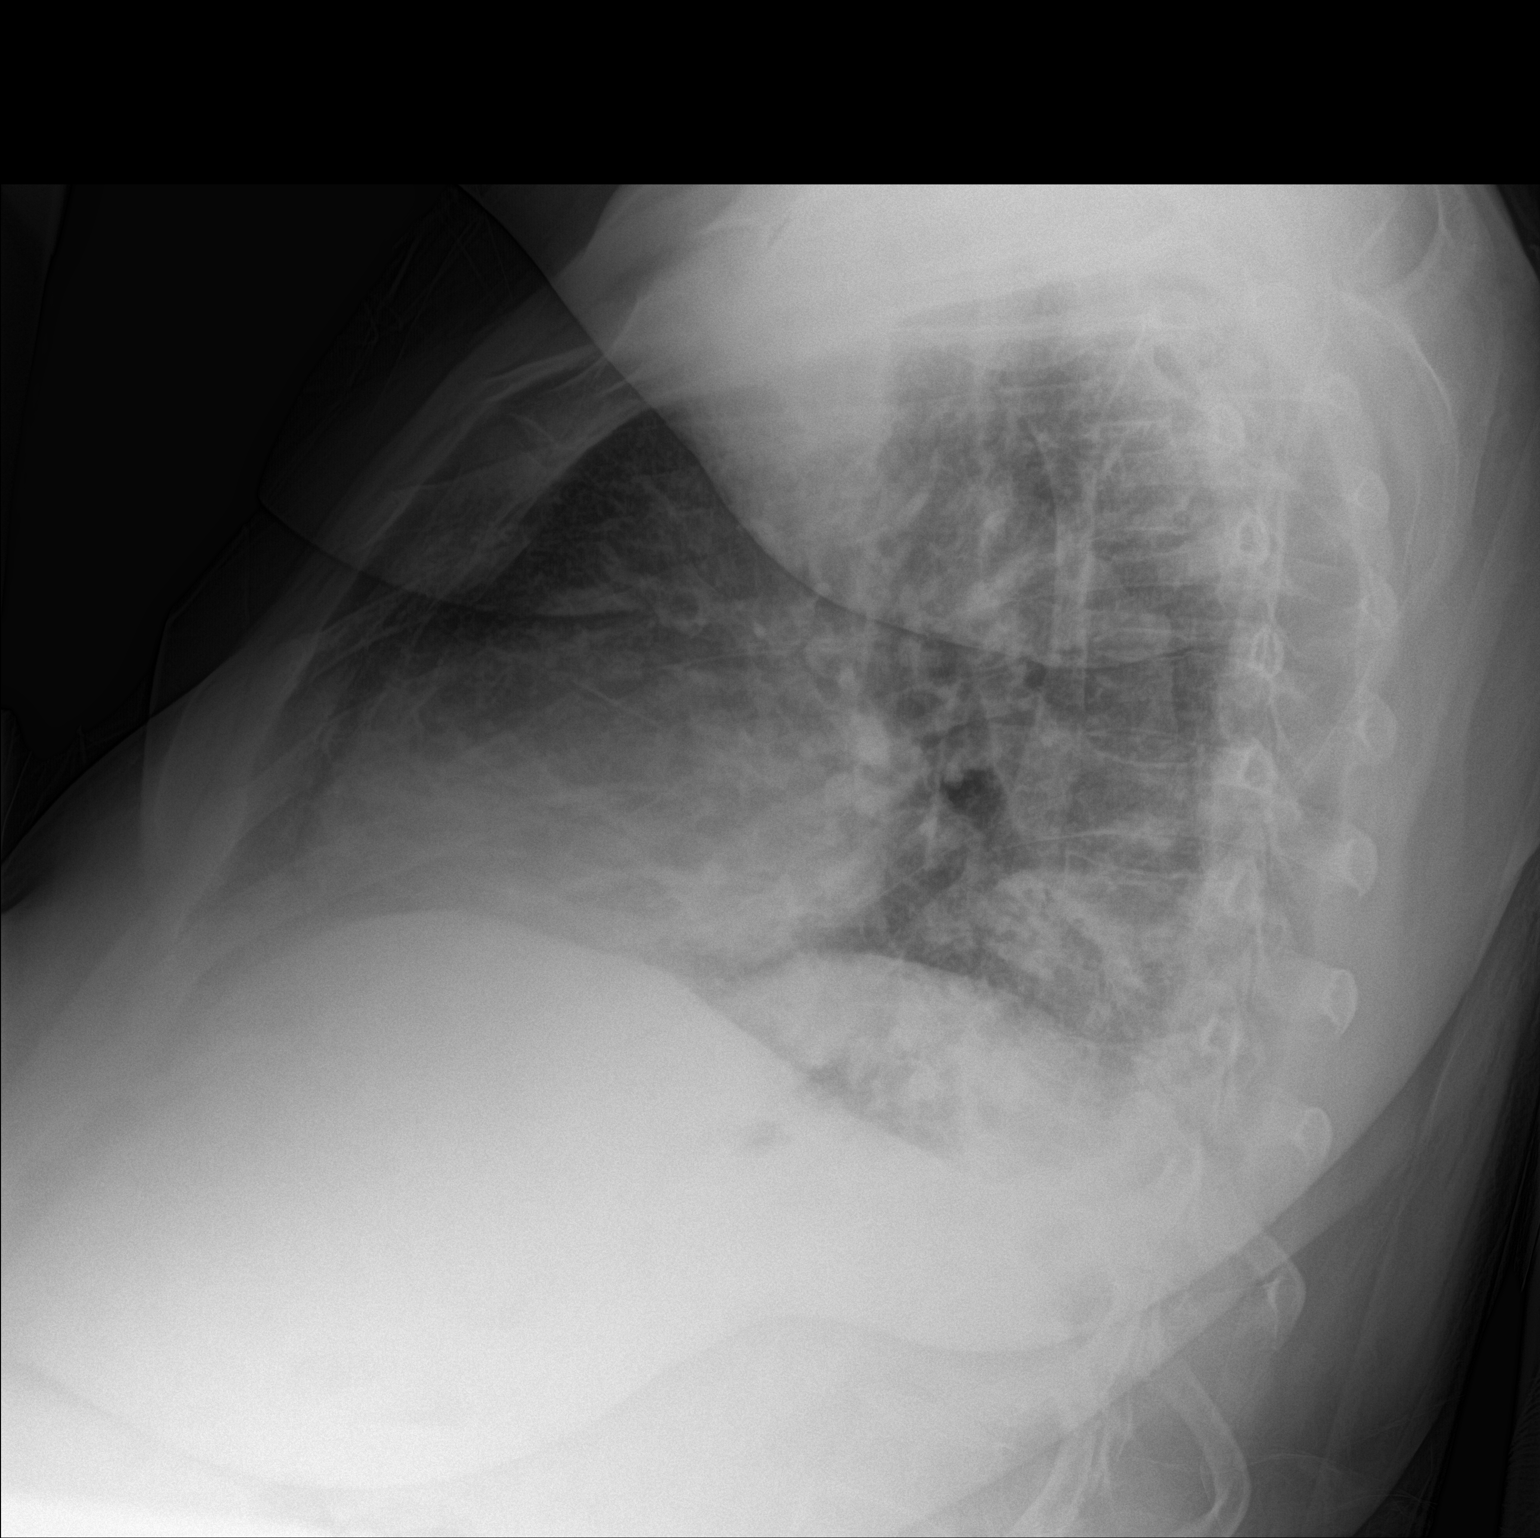
[im 2/2]
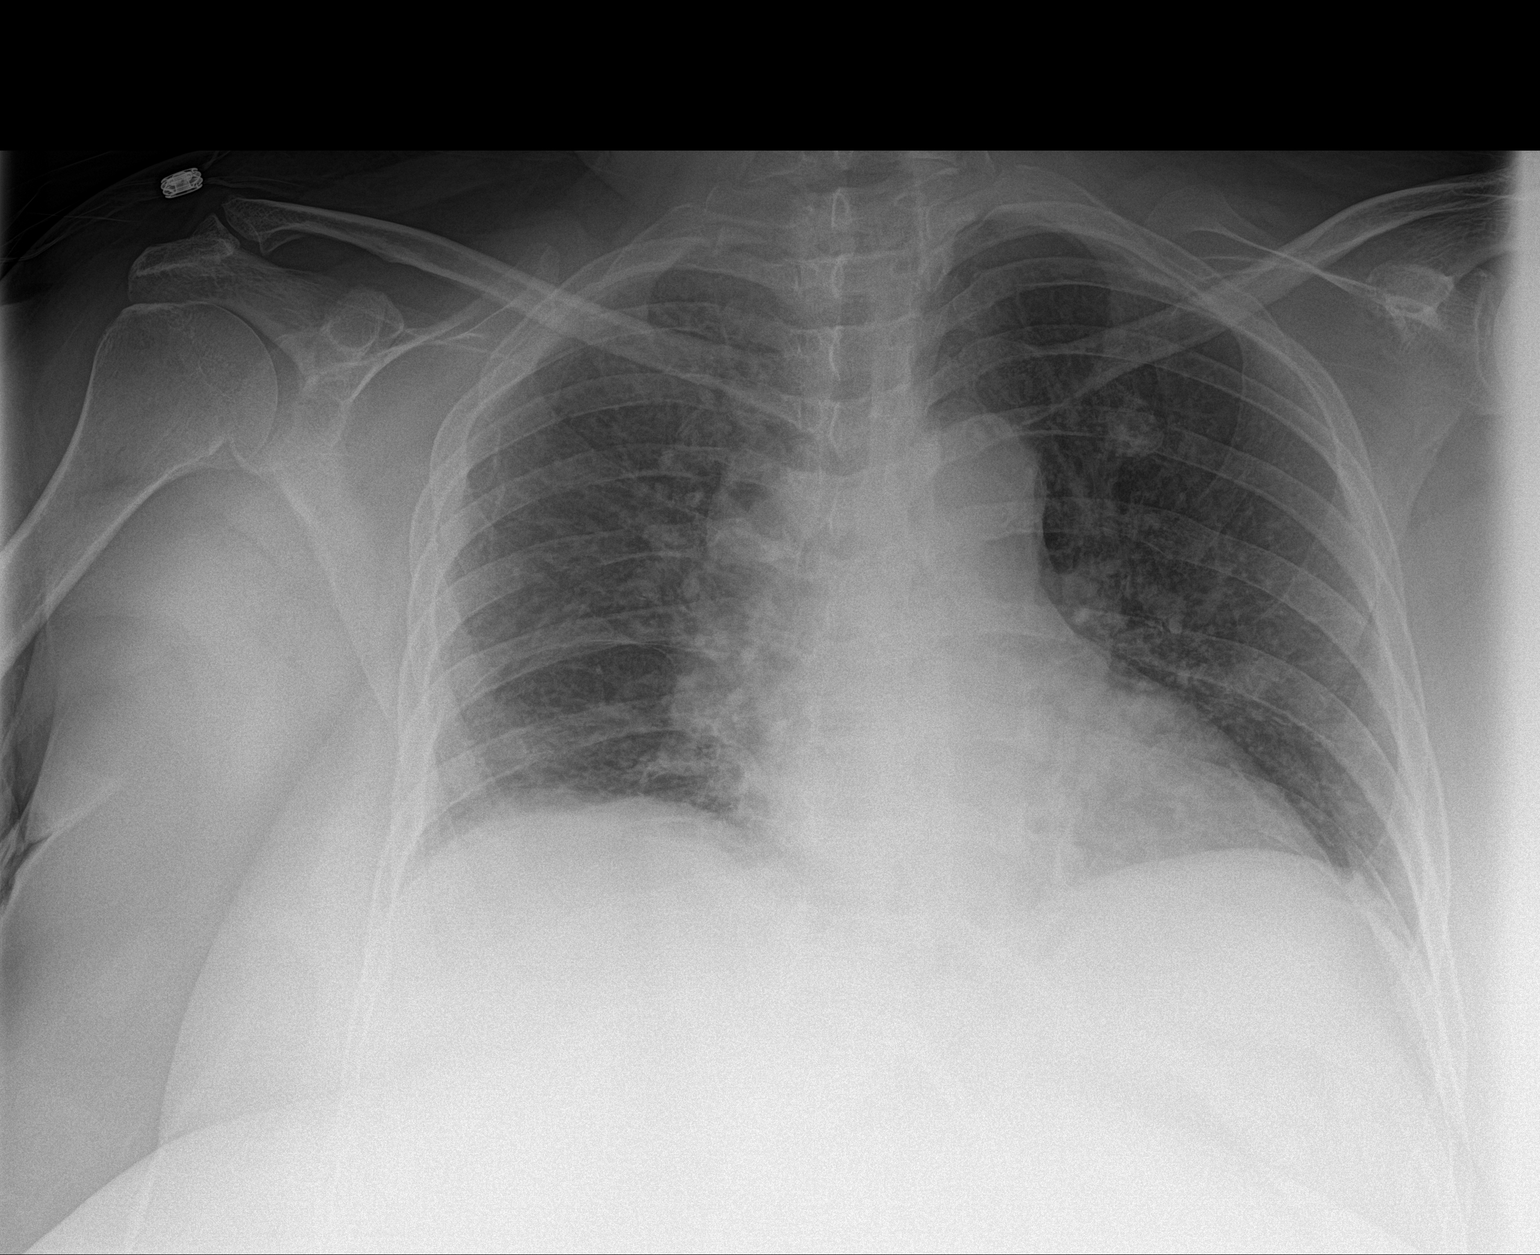

[2 of 2 positions shown; findings below may reference images not displayed]

FINDINGS: Low lung volumes. Mild right basilar opacity, likely atelectasis. No
pleural effusion or pneumothorax.

The heart is normal in size

Visualized osseous structures are within normal limits.
IMPRESSION: Mild right basilar opacity, likely atelectasis.

No evidence of acute cardiopulmonary disease.

## 2018-02-05 IMAGING — MR MR HEAD WO/W CM
9 of 14 series · 29 of 48 positions shown · IV contrast (multihance)
Comparison: Head CTs without contrast 02/24/2016 and earlier.

CLINICAL DATA: 59-year-old female with prior craniotomies and
craniectomies, including for meningioma more than 10 years ago. And
more recently in [REDACTED] right side subdural hematoma
evacuation with subsequent cranioplasty and ultimately craniectomy
for osteomyelitis and wound debridement. Headache. Initial
encounter.

EXAM:
MRI HEAD WITHOUT AND WITH CONTRAST
TECHNIQUE: Multiplanar, multiecho pulse sequences of the brain and surrounding
structures were obtained without and with intravenous contrast.
CONTRAST:  20mL MULTIHANCE GADOBENATE DIMEGLUMINE 529 MG/ML IV SOLN

[Series 4: DWI · axial · 4.0mm · 0.94mm/px · z∈[-96,+59]mm · 5 of 40 slices shown (1 of 2)]
[im 1/40]
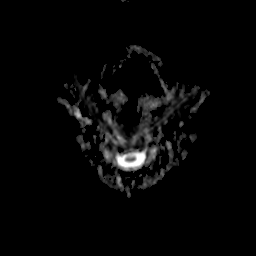
[im 10/40]
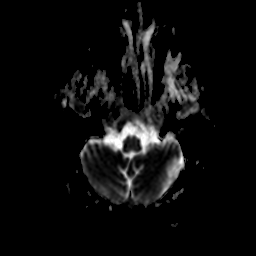
[im 20/40]
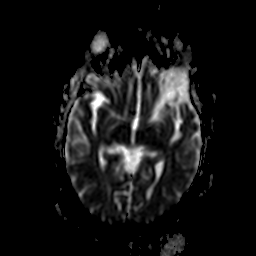
[im 30/40]
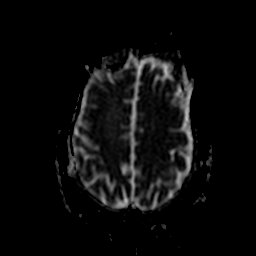
[im 40/40]
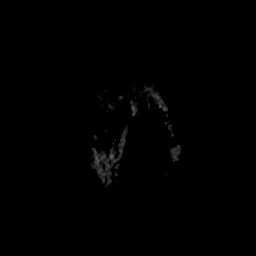

[Series 6: DWI · coronal · 5.0mm · 1.80mm/px · 3 of 35 slices shown (2 of 2)]
[im 1/35]
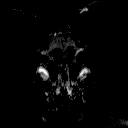
[im 18/35]
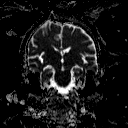
[im 35/35]
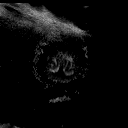

[Series 7: T2 · axial · 5.0mm · 0.45mm/px · z∈[-87,+55]mm · 2 of 23 slices shown (1 of 3)]
[im 1/23]
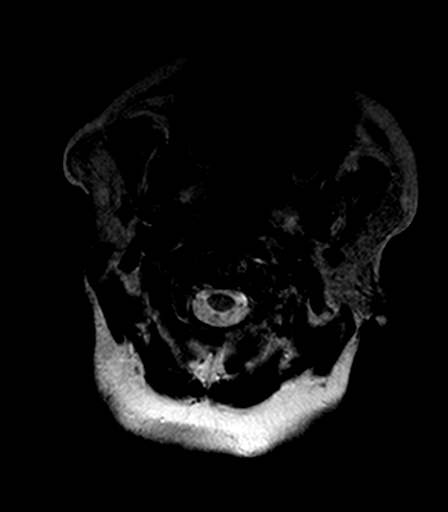
[im 23/23]
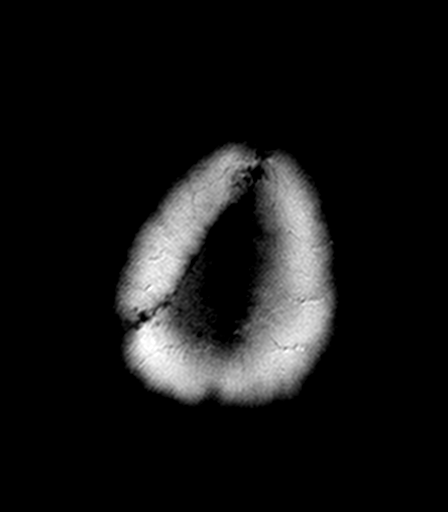

[Series 9: FLAIR · axial · 5.0mm · 0.90mm/px · z∈[-89,+53]mm · 2 of 23 slices shown]
[im 1/23]
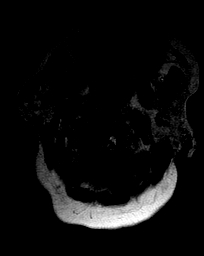
[im 23/23]
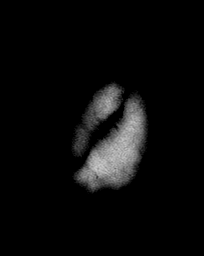

[Series 10: T2 · axial · 5.0mm · 0.45mm/px · z∈[-103,+65]mm · 3 of 27 slices shown (2 of 3)]
[im 1/27]
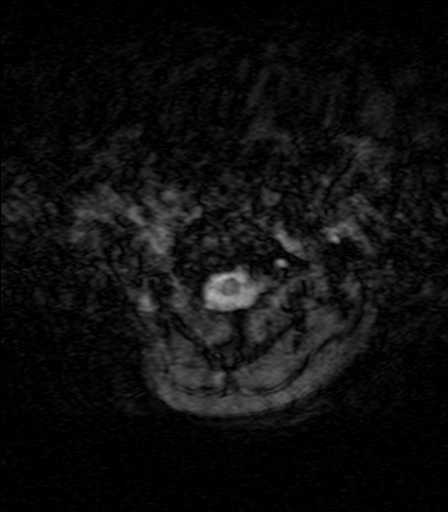
[im 14/27]
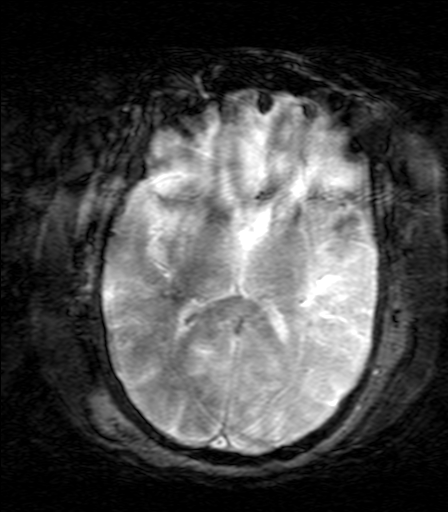
[im 27/27]
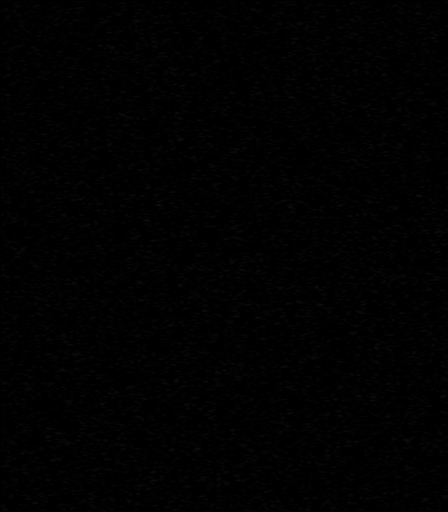

[Series 12: T2 · axial · 5.0mm · 0.45mm/px · z∈[-89,+53]mm · 2 of 23 slices shown (3 of 3)]
[im 1/23]
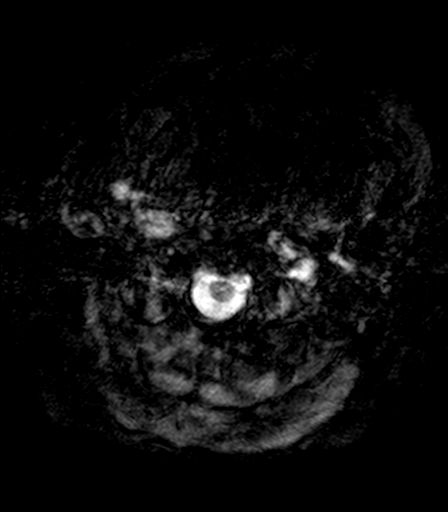
[im 23/23]
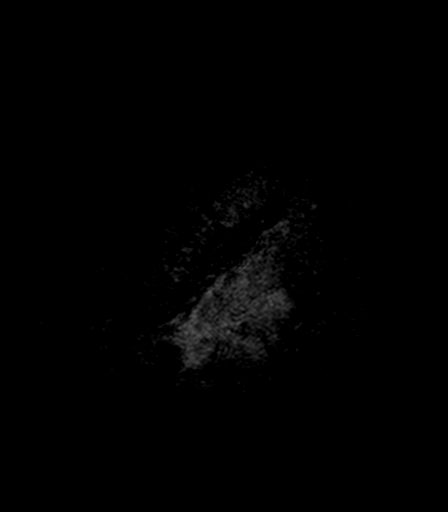

[Series 17: T1 post-contrast · coronal · 5.0mm · 0.45mm/px · 3 of 29 slices shown (1 of 3)]
[im 1/29]
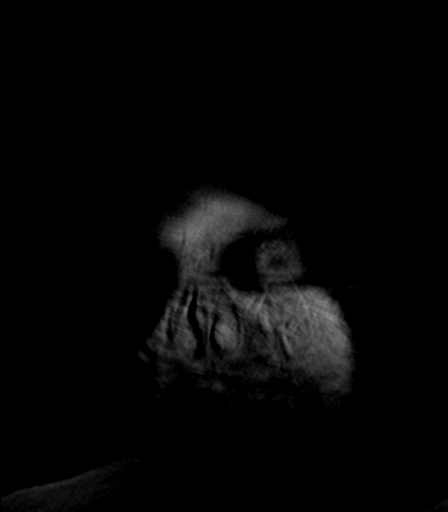
[im 15/29]
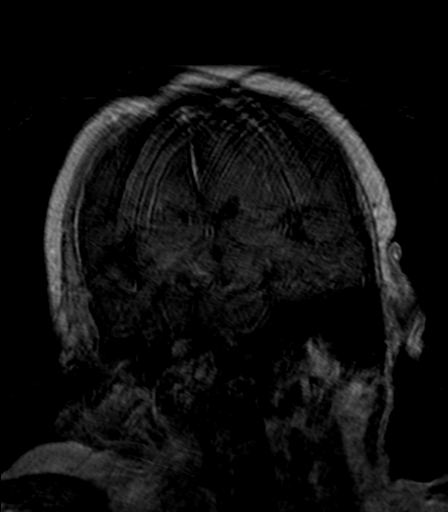
[im 29/29]
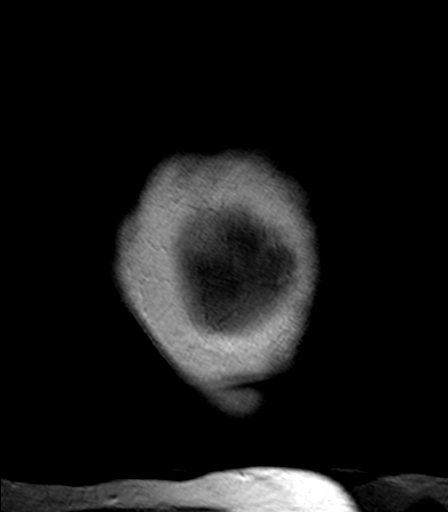

[Series 18: T1 post-contrast · axial · 3.0mm · 0.45mm/px · z∈[-124,+49]mm · 6 of 60 slices shown (2 of 3)]
[im 1/60]
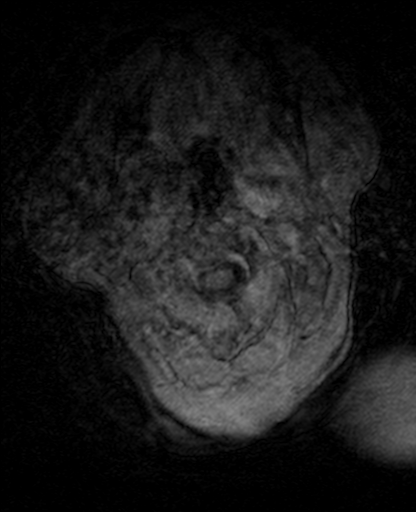
[im 12/60]
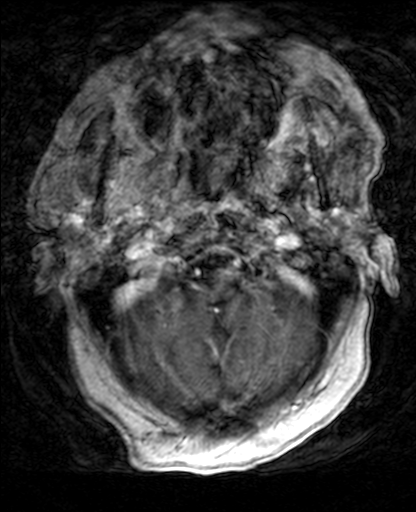
[im 24/60]
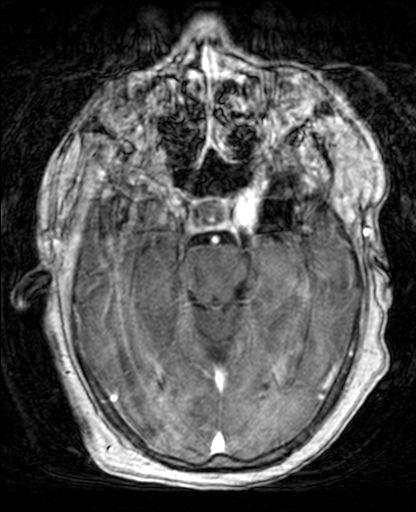
[im 36/60]
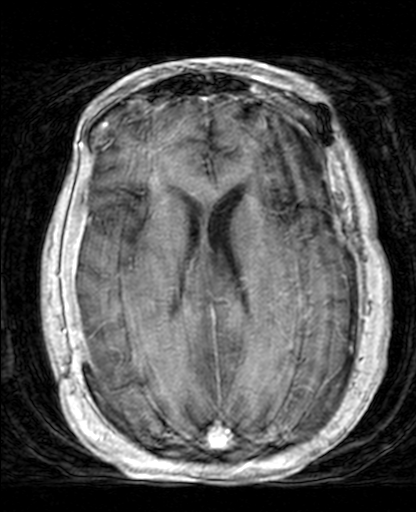
[im 48/60]
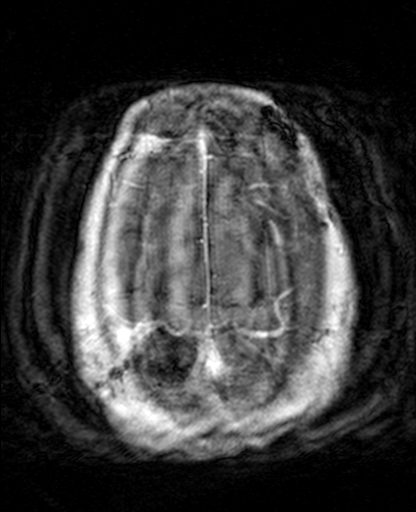
[im 60/60]
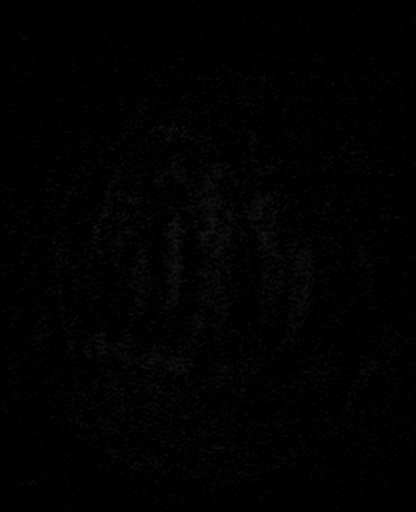

[Series 19: T1 post-contrast · sagittal · 5.0mm · 0.45mm/px · 3 of 29 slices shown (3 of 3)]
[im 1/29]
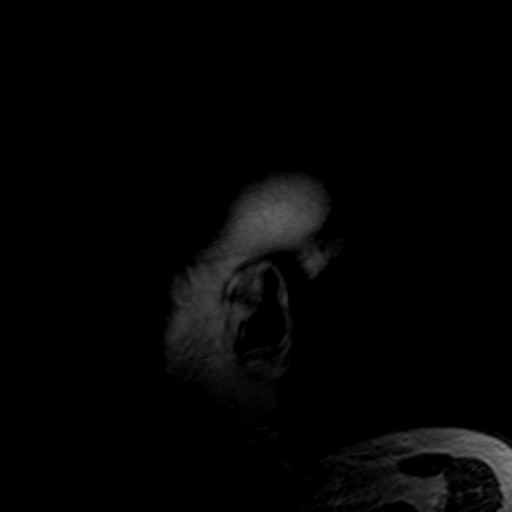
[im 15/29]
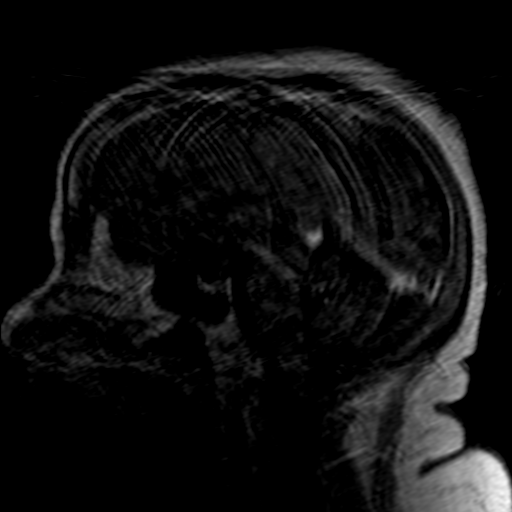
[im 29/29]
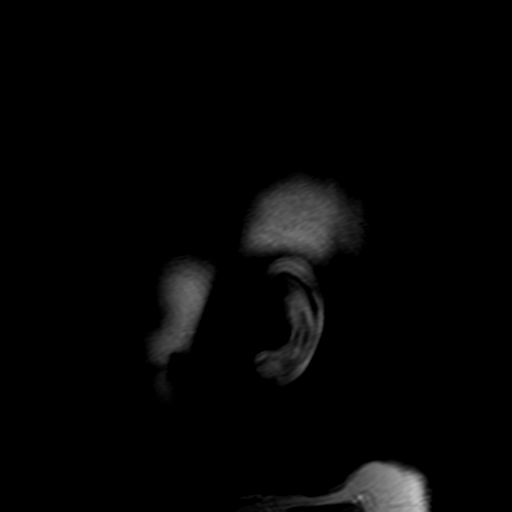

[29 of 48 positions shown; findings below may reference images not displayed]

FINDINGS: Study is intermittently, sometimes rather severely degraded by
motion artifact despite repeated imaging attempts.

Major intracranial vascular flow voids appear stable since the most
recent MRI in 5181. Chronic left anterior temporal and left inferior
frontal gyral encephalomalacia is stable since that time. Chronic
left frontotemporal craniotomy changes are stable.

More recent right superior and lateral craniectomy postoperative
changes. No definite dural thickening. No extra-axial collection
identified. Bone marrow signal has diffusely decreased throughout
the skull since 5181. Post-contrast images are degraded by motion
but no definite enhancing or destructive skull lesion is identified.

No restricted diffusion to suggest acute infarction. No midline
shift, mass effect, evidence of mass lesion, ventriculomegaly,
extra-axial collection or acute intracranial hemorrhage.
Cervicomedullary junction within normal limits. Partially empty
sella is chronic. No new signal abnormality in the brain compared to
5181. No abnormal parenchymal enhancement is evident.

Grossly normal visualized internal auditory structures. Visualized
paranasal sinuses and mastoids are stable and well pneumatized.
IMPRESSION: 1. Degraded by motion despite repeated imaging attempts.
2. Chronic left frontotemporal craniotomy and more recent right
superior craniectomy postoperative changes with no acute no acute
intracranial abnormality identified.
3. Diffusely decreased bone marrow signal since 5181 is nonspecific.
Query secondary to anemia or other chronic disease. No definite
destructive skull lesion.

## 2018-03-01 IMAGING — CT CT ABD-PELV W/ CM
2 of 5 series · 16 of 46 positions shown, 18 images · IV contrast (iopamidol)
Comparison: CT 10/20/2015

CLINICAL DATA: Pt in via EMS from home with complaints of recurrent
issues with a rectovaginal fistula which she has previously received
surgical intervention for in 3545. Pt reports new black discharge,
bleeding, pain and itching in rectum

.
EXAM:
CT ABDOMEN AND PELVIS WITH CONTRAST
TECHNIQUE: Multidetector CT imaging of the abdomen and pelvis was performed
using the standard protocol following bolus administration of
intravenous contrast.
CONTRAST:  100mL [id number] IOPAMIDOL (PRWDLD-O22) INJECTION 61%

[Series 3: axial st · axial · 0.74mm/px · z∈[-699,-289]mm · 13 of 96 slices shown, 15 images]
[im 7/96  soft-tissue]
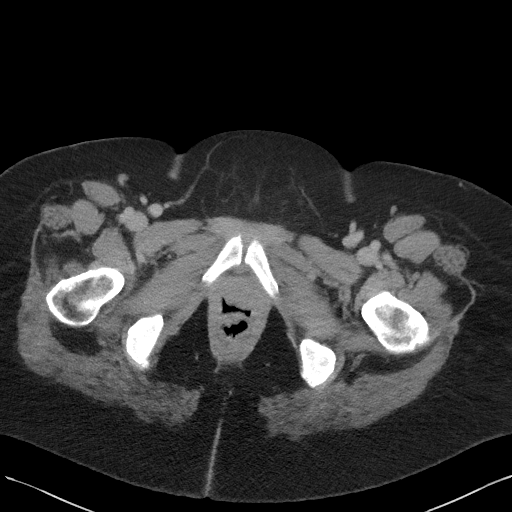
[im 7/96  bone]
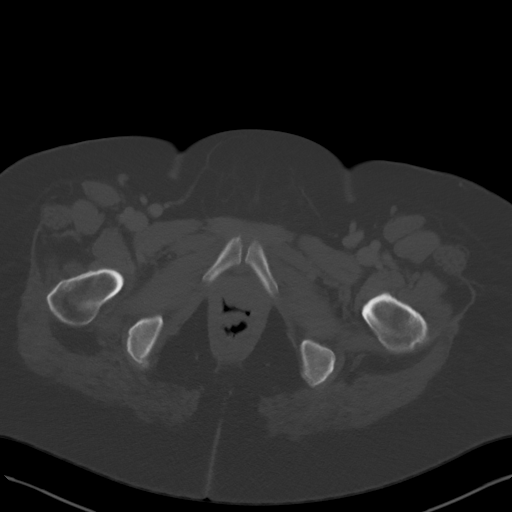
[im 14/96  soft-tissue]
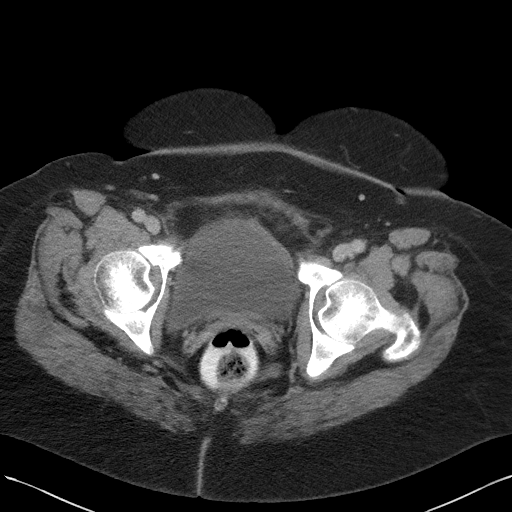
[im 21/96  soft-tissue]
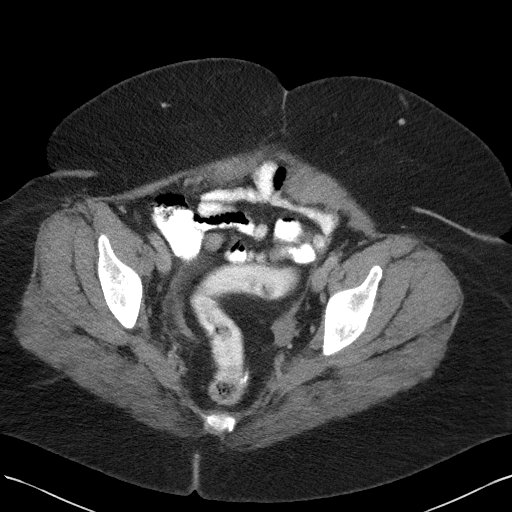
[im 28/96  soft-tissue]
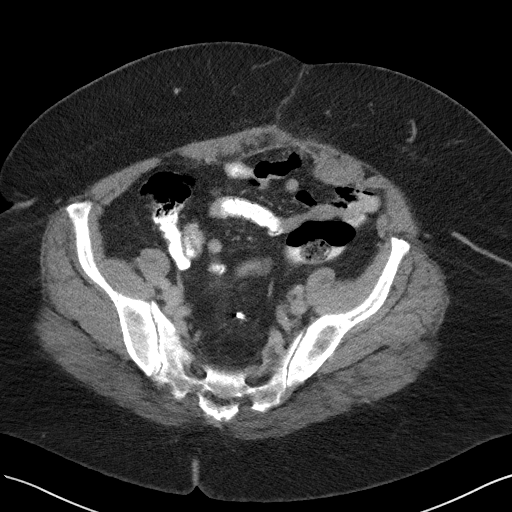
[im 34/96  soft-tissue]
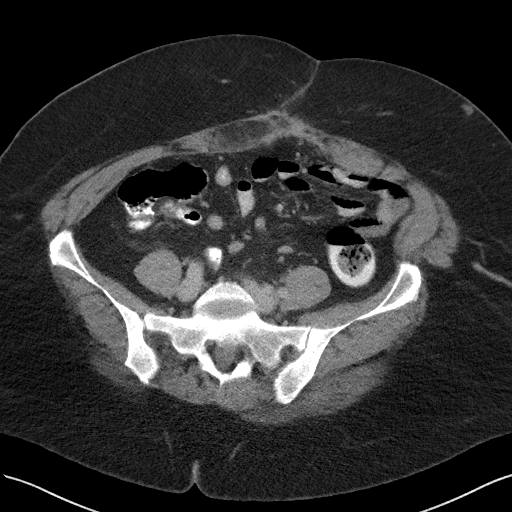
[im 41/96  soft-tissue]
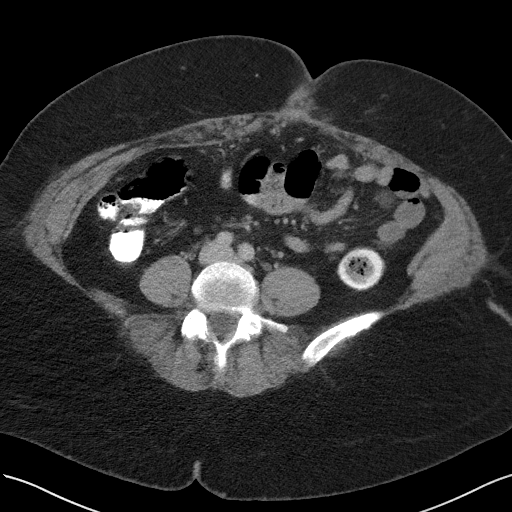
[im 48/96  soft-tissue]
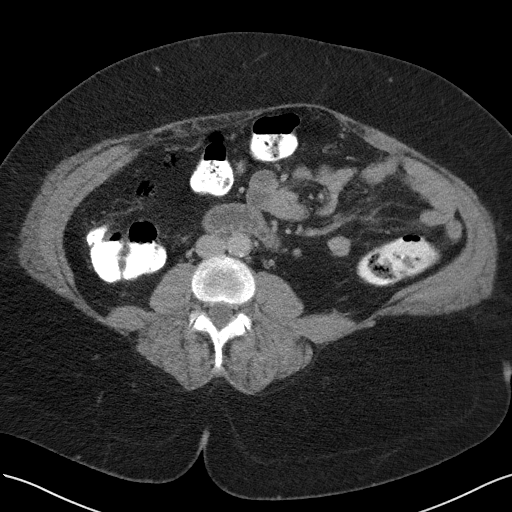
[im 55/96  soft-tissue]
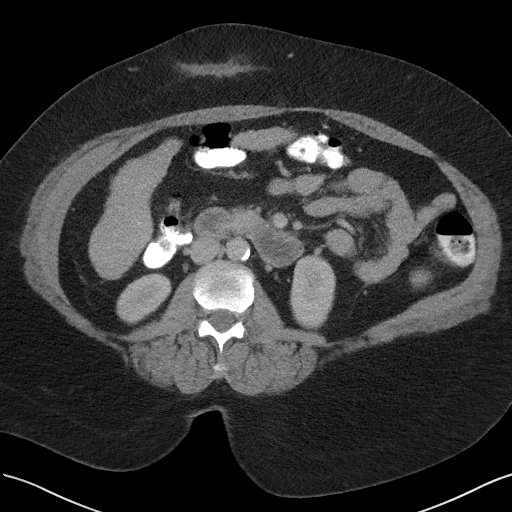
[im 62/96  soft-tissue]
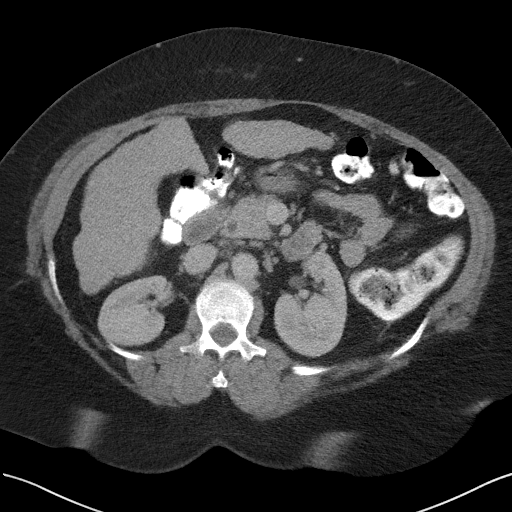
[im 62/96  bone]
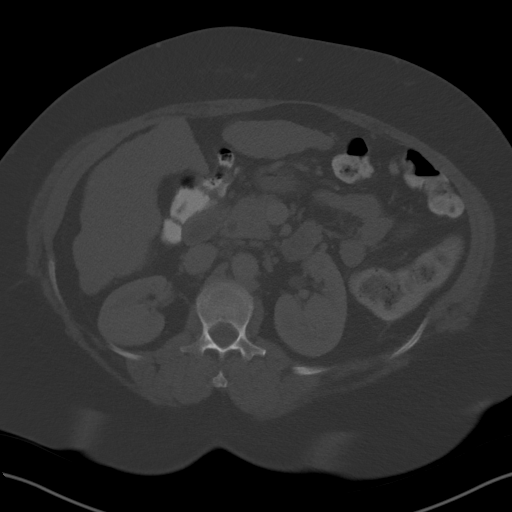
[im 68/96  soft-tissue]
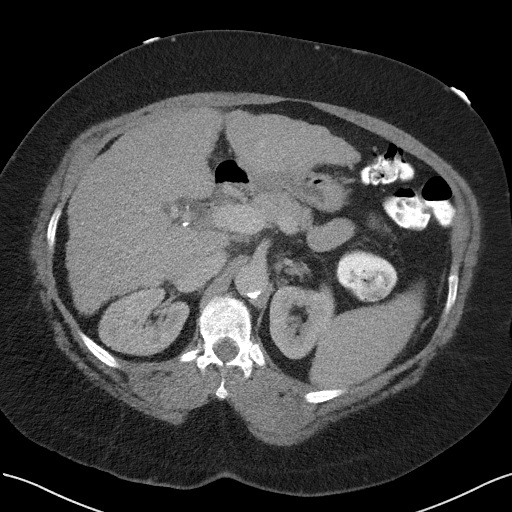
[im 75/96  soft-tissue]
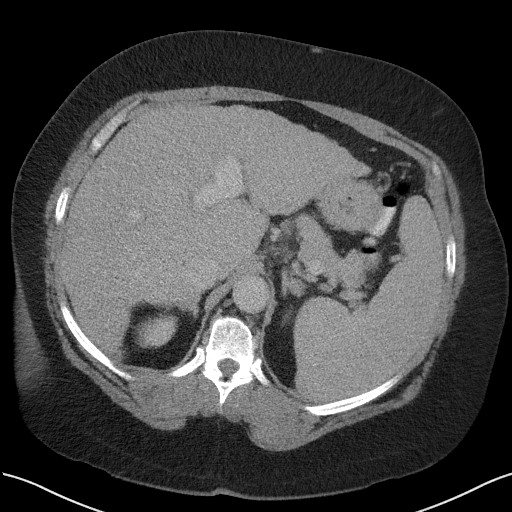
[im 82/96  soft-tissue]
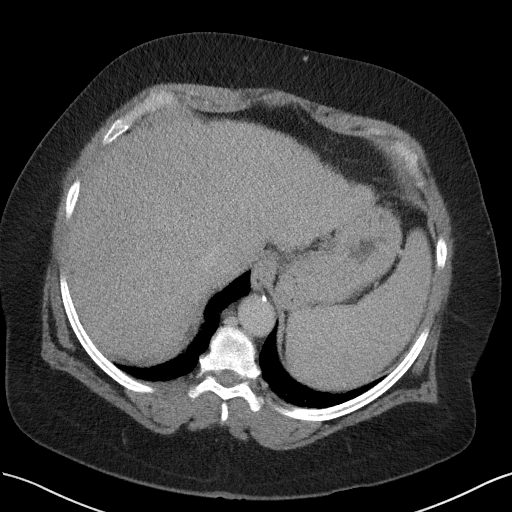
[im 89/96  soft-tissue]
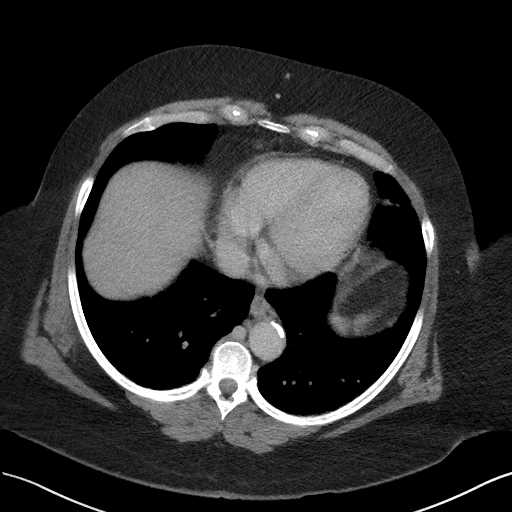

[Series 6: coronal st · coronal · 0.80mm/px · 3 of 109 slices shown]
[im 37/109  soft-tissue]
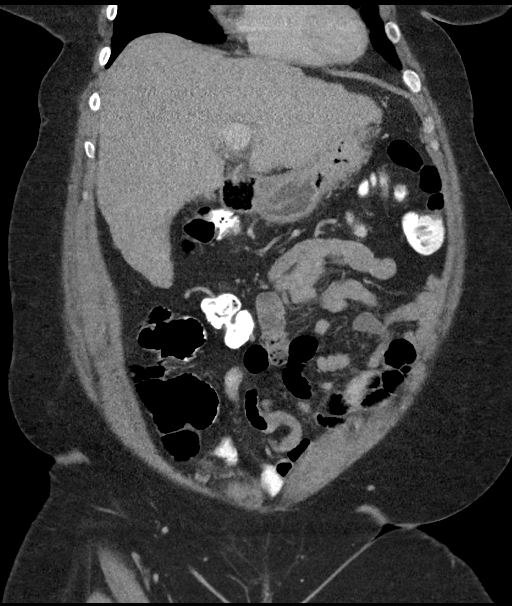
[im 49/109  soft-tissue]
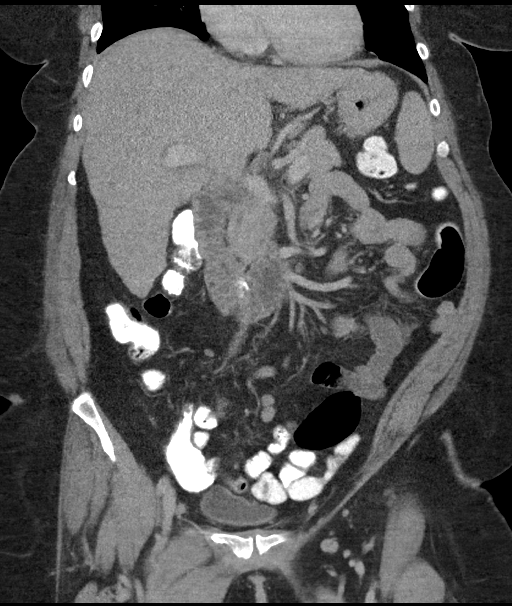
[im 61/109  soft-tissue]
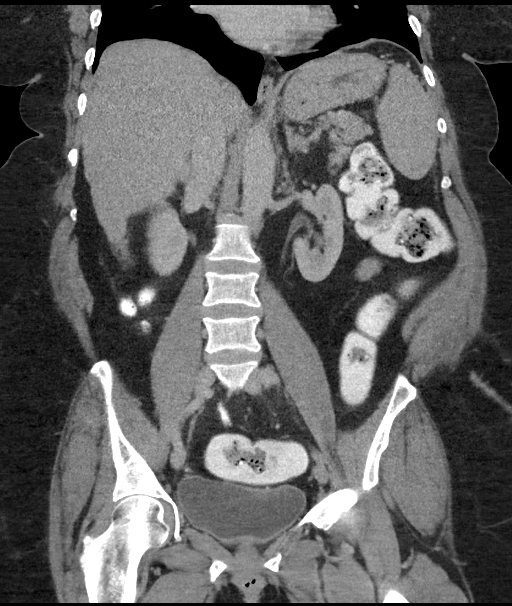

[16 of 46 positions shown; findings below may reference images not displayed]

FINDINGS: Lower chest: Lung bases are clear.

Hepatobiliary: No focal hepatic lesion. No biliary duct dilatation.
Gallbladder is normal. Common bile duct is normal.

Pancreas: Pancreas is normal. No ductal dilatation. No pancreatic
inflammation.

Spleen: Normal spleen

Adrenals/urinary tract: Adrenal glands and kidneys are normal. The
ureters and bladder normal.

Stomach/Bowel: Stomach, small-bowel and cecum normal. There is
contrast throughout the colon presumably from the rectal contrast.
The rectum appears normal. There is a low rectal anastomosis.

Vascular/Lymphatic: Abdominal aorta is normal caliber with
atherosclerotic calcification. There is no retroperitoneal or
periportal lymphadenopathy. No pelvic lymphadenopathy.

Reproductive: Post hysterectomy. At the level the vaginal cuff small
amount of high-density material is present but similar to other soft
tissues tissues of the pelvis. Smaller gas within the fornix of the
vagina which is not uncommon.

Other: No free fluid.

Musculoskeletal: No aggressive osseous lesion.
IMPRESSION: 1. No clear evidence of communication between the rectum to the
vagina. The rectum is well distended with rectal contrast. Low
rectal anastomosis without evidence of leak.
2. Morphologic changes consists with cirrhosis.  No ascites .

## 2018-03-30 IMAGING — MR MR SHOULDER*L* W/O CM
5 series · 40 of 40 positions shown · non-contrast
Comparison: Chest CT from 10/20/2015

CLINICAL DATA: Left shoulder pain for 5 months after dislocation.
Limited and painful range of motion.

EXAM:
MRI OF THE LEFT SHOULDER WITHOUT CONTRAST
TECHNIQUE: Multiplanar, multisequence MR imaging of the shoulder was performed.
No intravenous contrast was administered.

[Series 4: T2 fat-sat · coronal · 4.0mm · 0.62mm/px · 7 of 19 slices shown (1 of 3)]
[im 1/19]
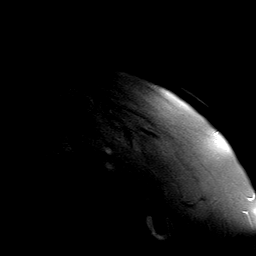
[im 4/19]
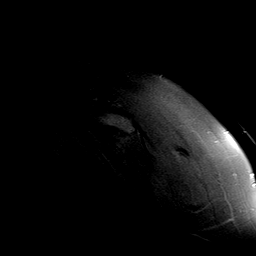
[im 7/19]
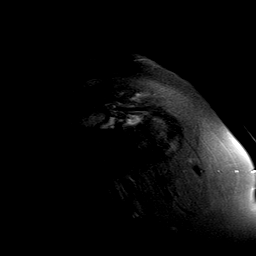
[im 10/19]
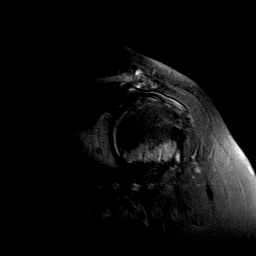
[im 13/19]
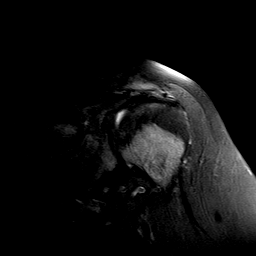
[im 16/19]
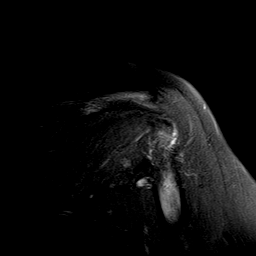
[im 19/19]
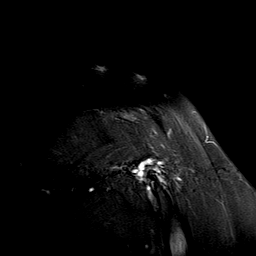

[Series 5: PD · coronal · 4.0mm · 0.62mm/px · 8 of 19 slices shown]
[im 1/19]
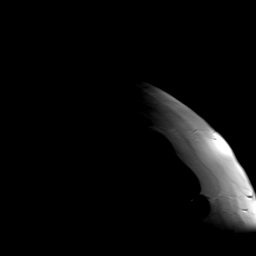
[im 3/19]
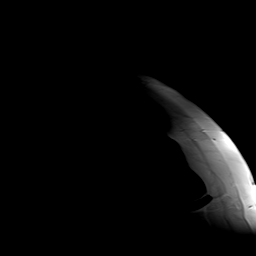
[im 6/19]
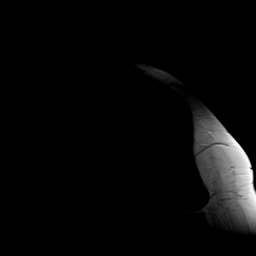
[im 8/19]
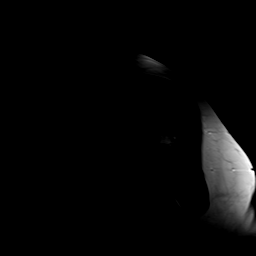
[im 11/19]
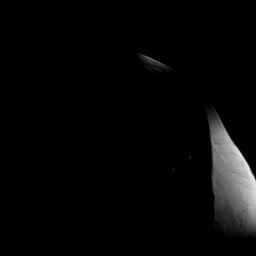
[im 13/19]
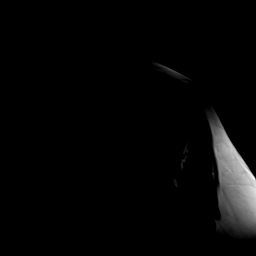
[im 16/19]
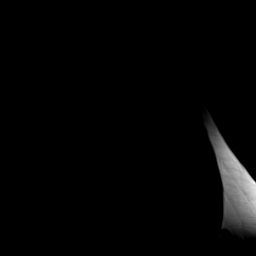
[im 19/19]
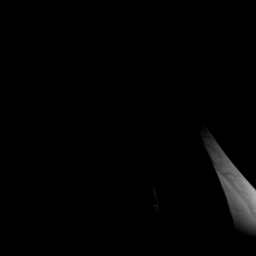

[Series 6: T1 · oblique · 4.0mm · 0.62mm/px · 8 of 19 slices shown]
[im 1/19]
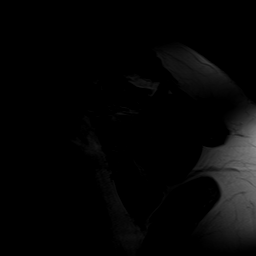
[im 3/19]
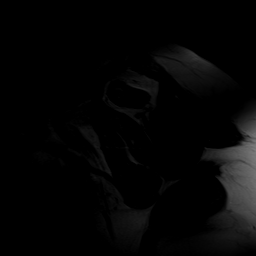
[im 6/19]
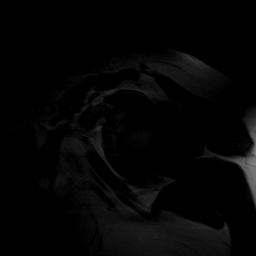
[im 8/19]
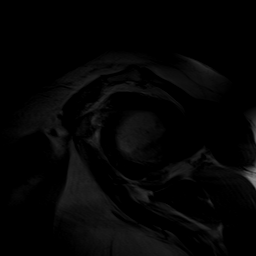
[im 11/19]
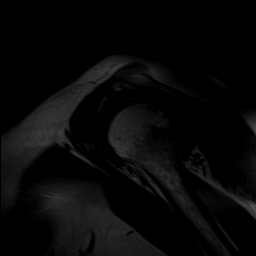
[im 13/19]
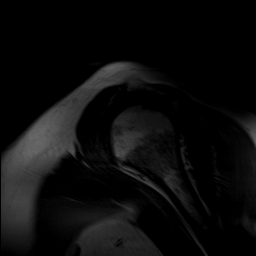
[im 16/19]
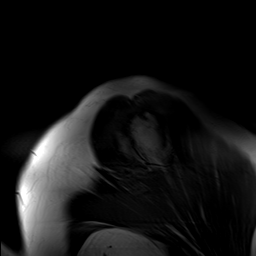
[im 19/19]
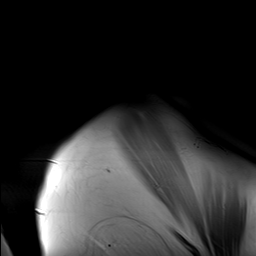

[Series 7: T2 fat-sat · oblique · 4.0mm · 0.62mm/px · 8 of 19 slices shown (2 of 3)]
[im 1/19]
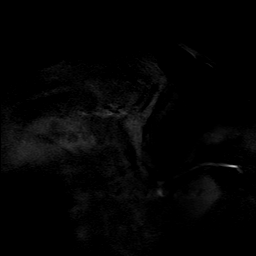
[im 3/19]
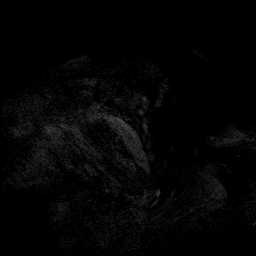
[im 6/19]
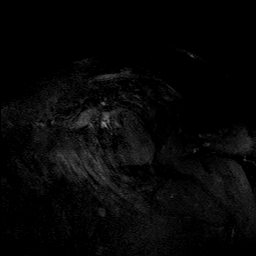
[im 8/19]
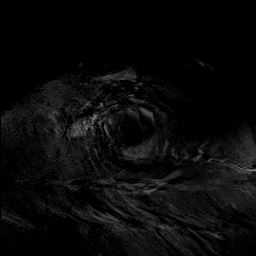
[im 11/19]
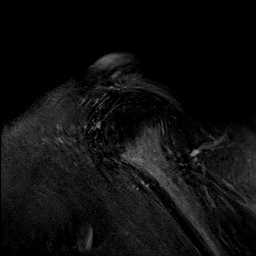
[im 13/19]
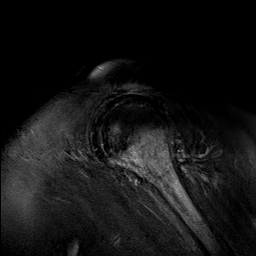
[im 16/19]
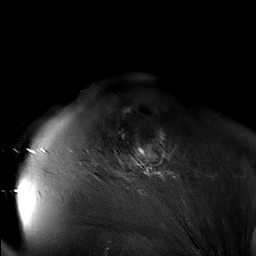
[im 19/19]
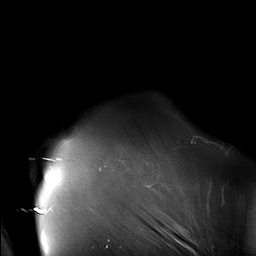

[Series 8: T2 fat-sat · axial · 4.0mm · 0.47mm/px · z∈[-29,+56]mm · 9 of 21 slices shown (3 of 3)]
[im 1/21]
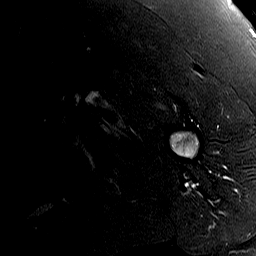
[im 3/21]
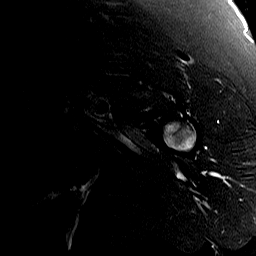
[im 6/21]
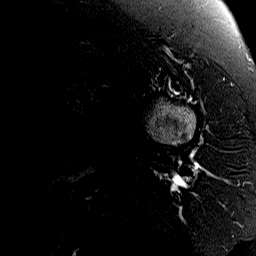
[im 8/21]
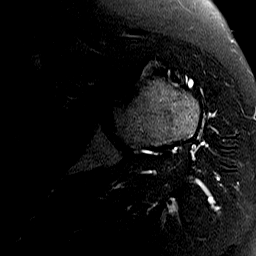
[im 11/21]
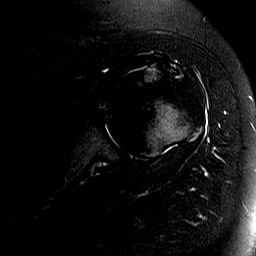
[im 13/21]
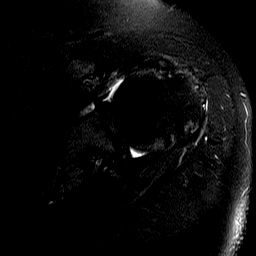
[im 16/21]
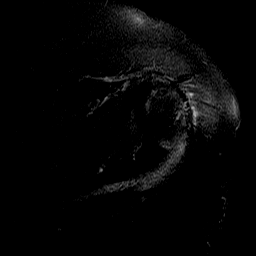
[im 18/21]
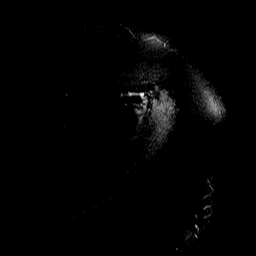
[im 21/21]
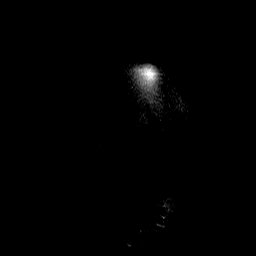

[40 of 40 positions shown; findings below may reference images not displayed]

FINDINGS: Despite efforts by the technologist and patient, motion artifact is
present on today's exam and could not be eliminated. This reduces
exam sensitivity and specificity.

Rotator cuff: Near the interface between the supraspinatus and
infraspinatus tendon distally on image [DATE] there is thought to be
tendinopathy and likely some fissuring. I doubt that this is a
full-thickness tear, although the motion artifact is severe and
causes diagnostic uncertainty.

Muscles:  Unremarkable

Biceps long head: The intra-articular portion is vaguely seen. There
could be some tendinopathy or partial tearing, the motion artifact
interferes with visualization.

Acromioclavicular Joint: Mild degenerative spurring. Trace fluid in
the subacromial subdeltoid bursa. Subacromial morphology is type 2
(curved).

Glenohumeral Joint: No joint effusion. Mild degenerative chondral
thinning. I do not see an obvious bony Bankart injury.

Labrum:  Motion artifact precludes assessment.

Bones: No well-defined Hill-Sachs impaction. Red marrow
redistribution is present. This is nonspecific can be associated
with a variety of conditions including chronic anemia, chronic heart
failure, myelofibrosis, infiltrative marrow disease, response to
infection, and other conditions.

Other: No supplemental non-categorized findings.
IMPRESSION: 1. Tendinopathy and possibly mild fissuring along the posterior
supraspinatus/anterior infraspinatus tendon distally. I doubt this
is a full-thickness tear.
2. Trace subacromial subdeltoid bursitis.
3. Poor visualization of the intra-articular portion of the long
head of the biceps, tendinopathy or partial tearing not excluded.
4. I do not see an obvious bony Bankart injury or a significant
Hill-Sachs impaction.
5. Mild degenerative spurring of the AC joint and mild degenerative
chondral thinning in the glenohumeral joint.
6. Red marrow redistribution is present. This is nonspecific can be
associated with a variety of conditions including chronic anemia,
chronic heart failure, myelofibrosis, infiltrative marrow disease,
response to infection, and other conditions.
7. Motion artifact is severe in this case and reduces diagnostic
sensitivity and specificity.

## 2019-06-23 IMAGING — CR DG CHEST 2V
1 series · 2 of 2 positions shown · non-contrast
Comparison: None.

CLINICAL DATA: Rectal bleeding

EXAM:
CHEST  2 VIEW

[Series 1: dg chest 2 view · 0.14mm/px · 2 of 2 slices shown]
[im 1/2]
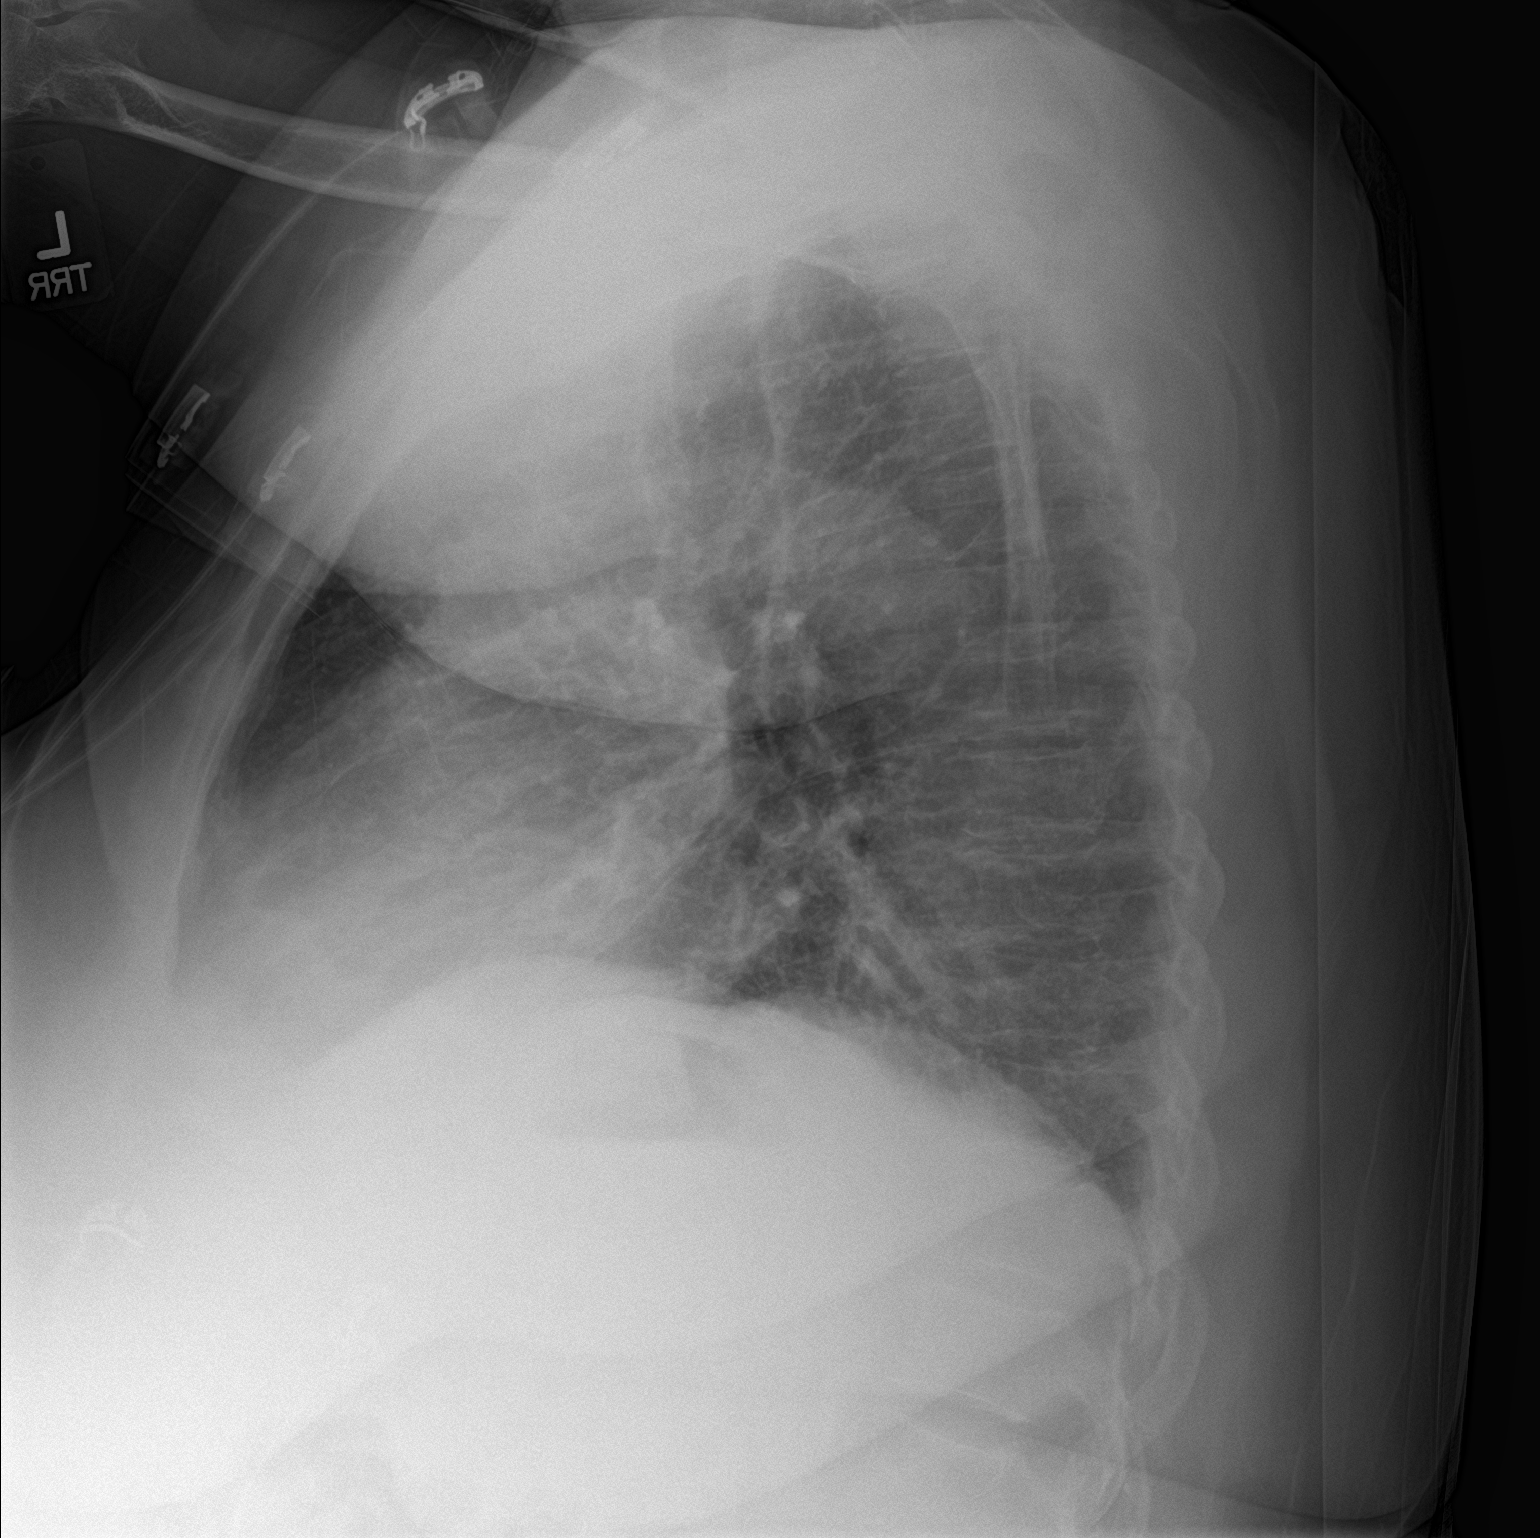
[im 2/2]
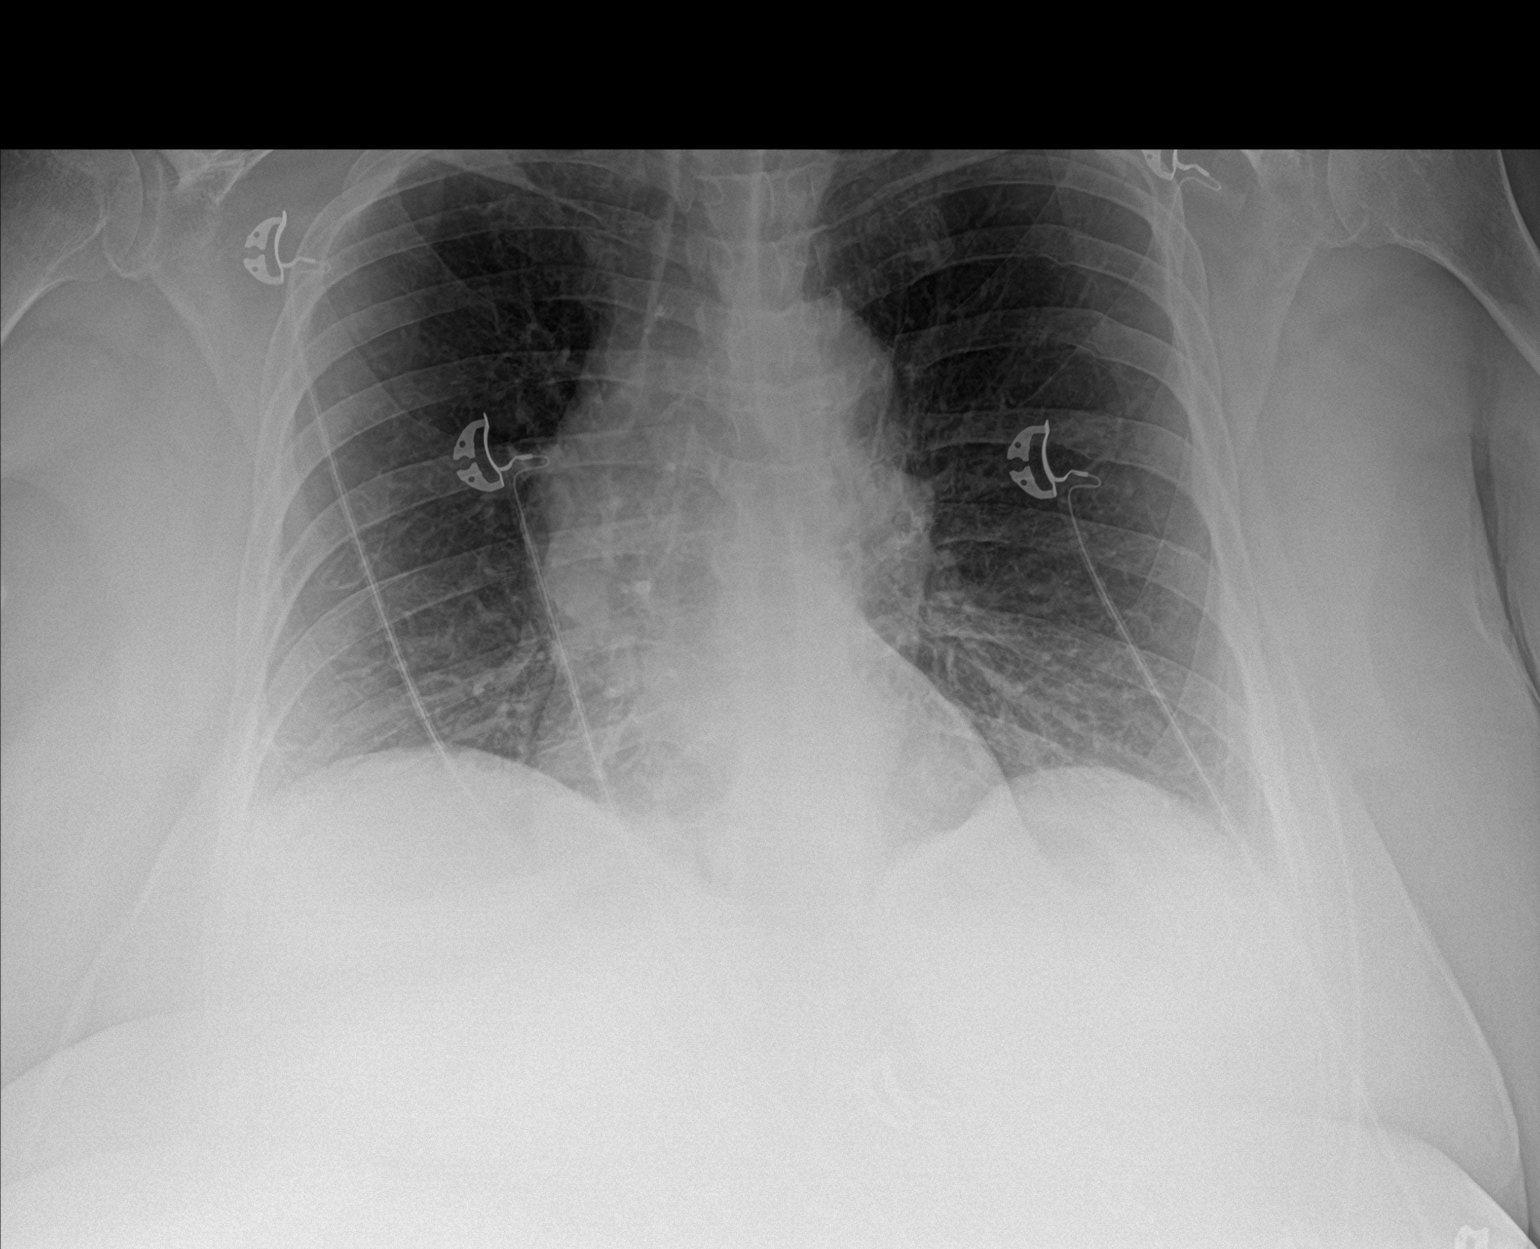

[2 of 2 positions shown; findings below may reference images not displayed]

FINDINGS: Normal heart size. Slight uncoiling of the thoracic aorta with
aortic atherosclerosis. Mild upper lobe emphysematous hyperinflation
with crowding of lower lobe interstitial lung markings. Minimal
subsegmental atelectasis at the right lung base overlying the
costophrenic angle. No pneumonic consolidation or CHF. No effusion
or pneumothorax. Mild degenerative change along the dorsal spine
without acute osseous abnormality.
IMPRESSION: Upper lobe predominant emphysematous hyperinflation with crowding of
lower lobe interstitial lung markings. No pneumonic consolidation or
effusion. No CHF.

## 2019-06-24 IMAGING — CT CT ABD-PELV W/ CM
2 of 5 series · 16 of 46 positions shown, 18 images · IV contrast (iopamidol)
Comparison: 03/21/2016 and 08/30/2014 CT abdomen and pelvis.

CLINICAL DATA: 61 y/o F; history of appendectomy, cholecystectomy,
and colostomy with reversal. Blood in stool or urine.

EXAM:
CT ABDOMEN AND PELVIS WITH CONTRAST
TECHNIQUE: Multidetector CT imaging of the abdomen and pelvis was performed
using the standard protocol following bolus administration of
intravenous contrast.
CONTRAST:  125mL [id number] IOPAMIDOL ([id number]) INJECTION 61%

[Series 2: routine abd/pel with · axial · 0.79mm/px · z∈[-1122,-666]mm · 13 of 103 slices shown, 15 images]
[im 6/103  soft-tissue]
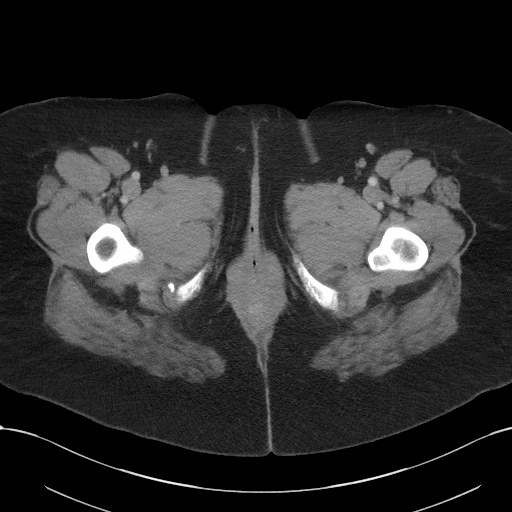
[im 6/103  bone]
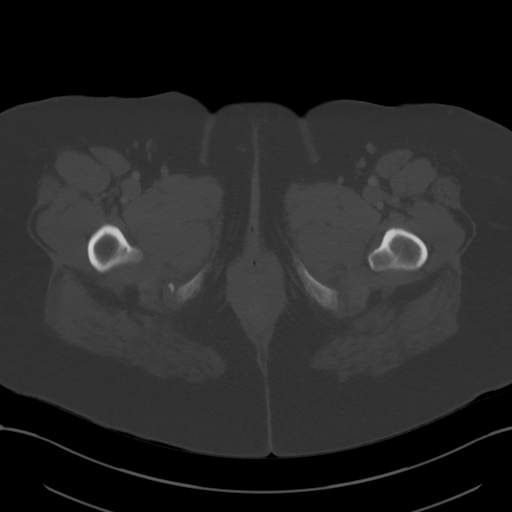
[im 17/103  soft-tissue]
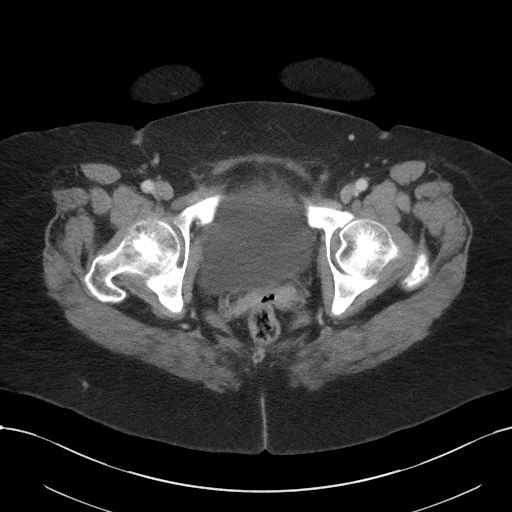
[im 22/103  soft-tissue]
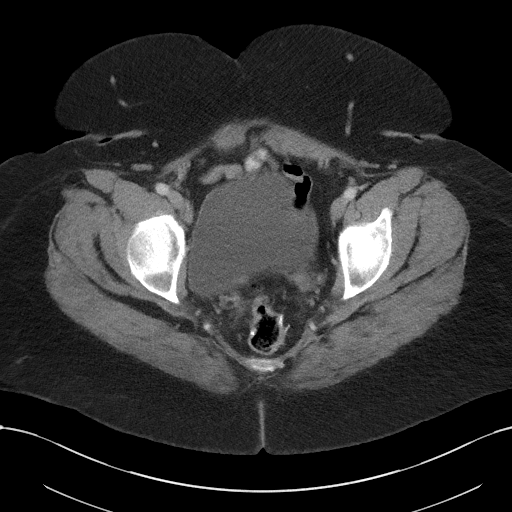
[im 27/103  soft-tissue]
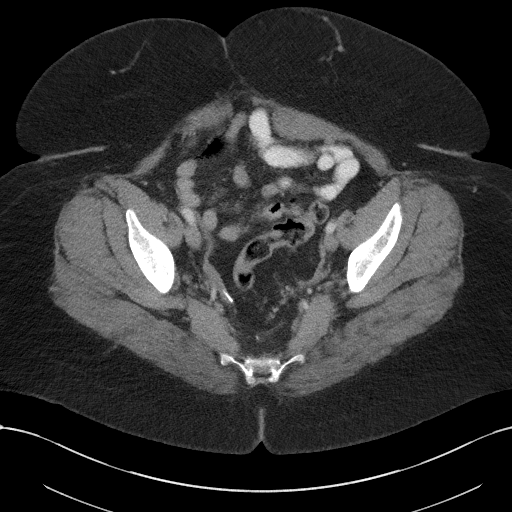
[im 38/103  soft-tissue]
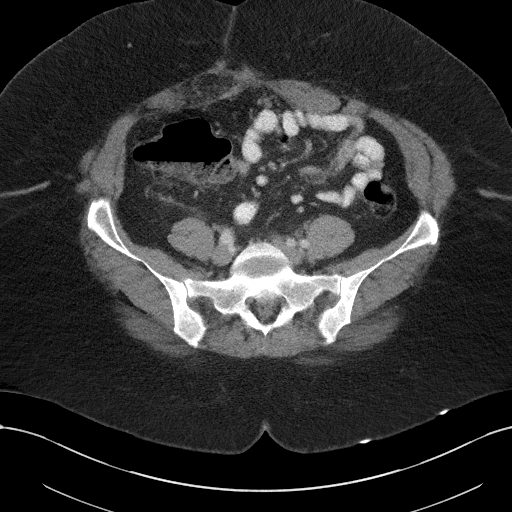
[im 43/103  soft-tissue]
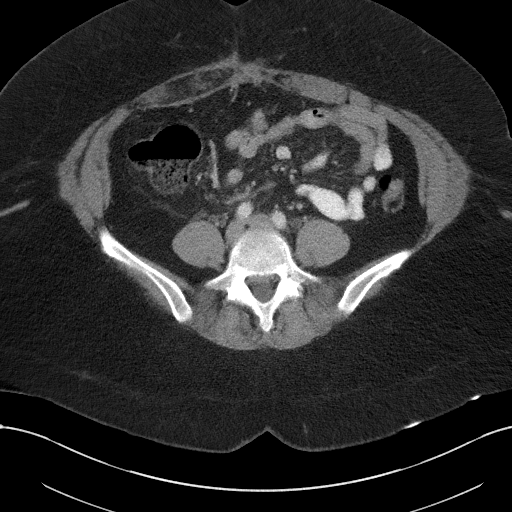
[im 54/103  soft-tissue]
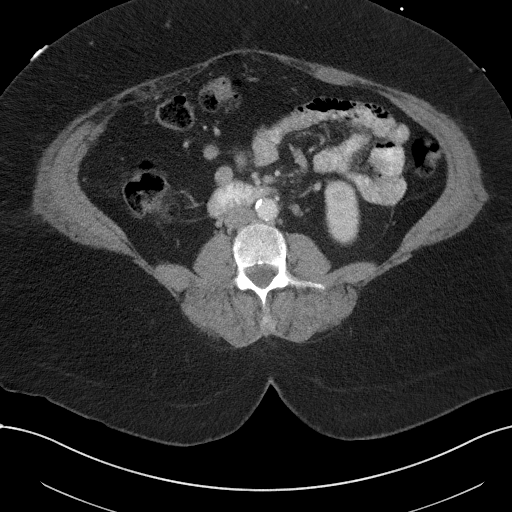
[im 60/103  soft-tissue]
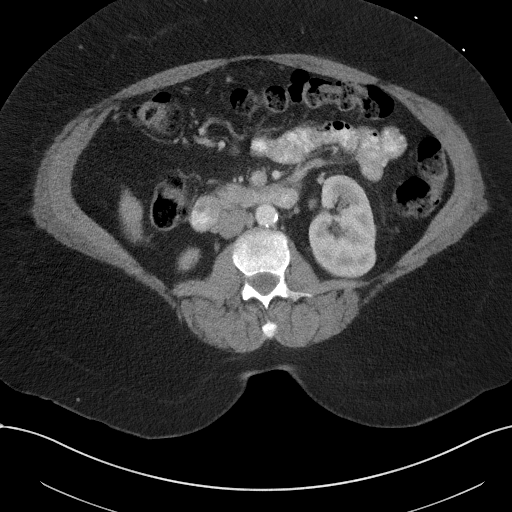
[im 65/103  soft-tissue]
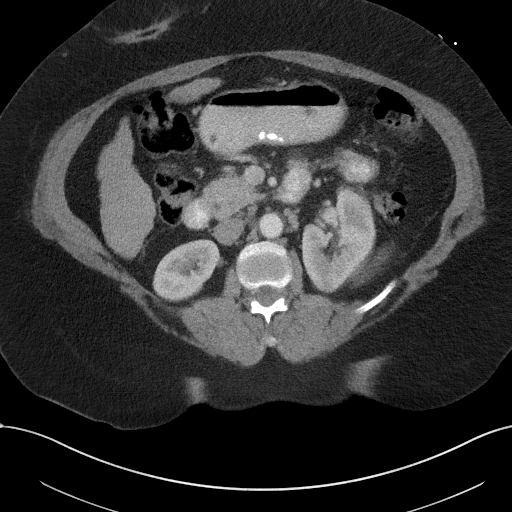
[im 65/103  bone]
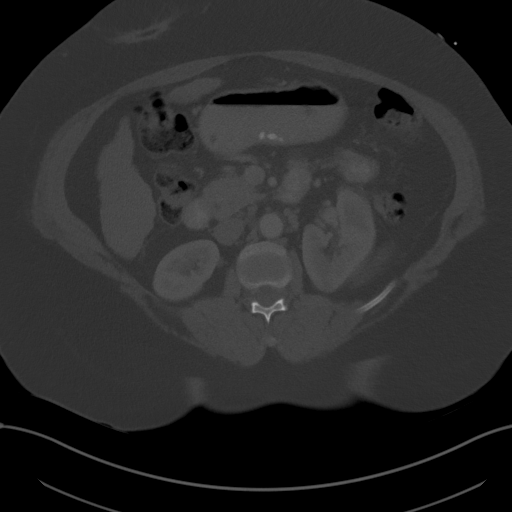
[im 76/103  soft-tissue]
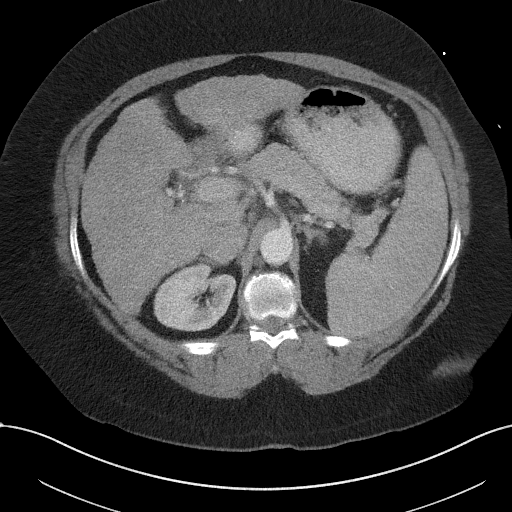
[im 81/103  soft-tissue]
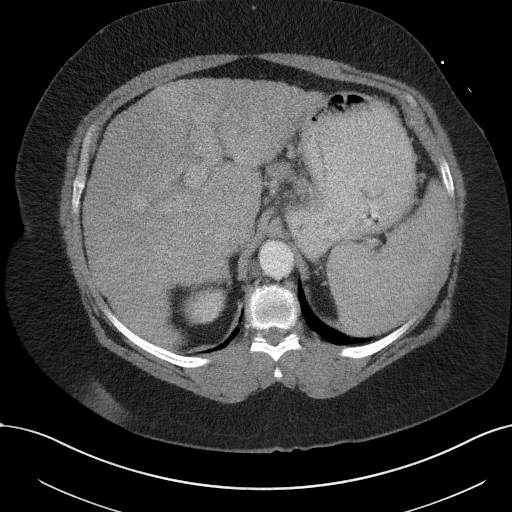
[im 86/103  soft-tissue]
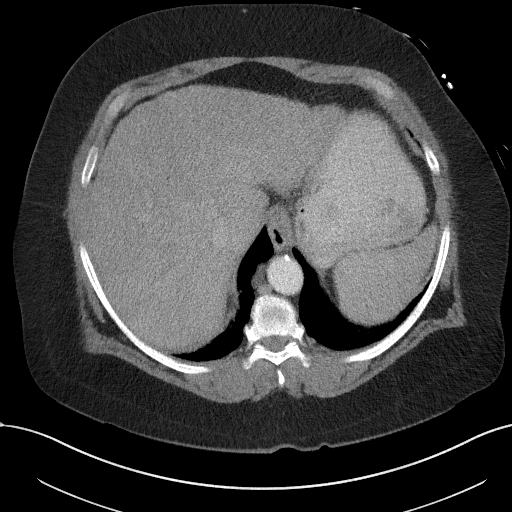
[im 97/103  soft-tissue]
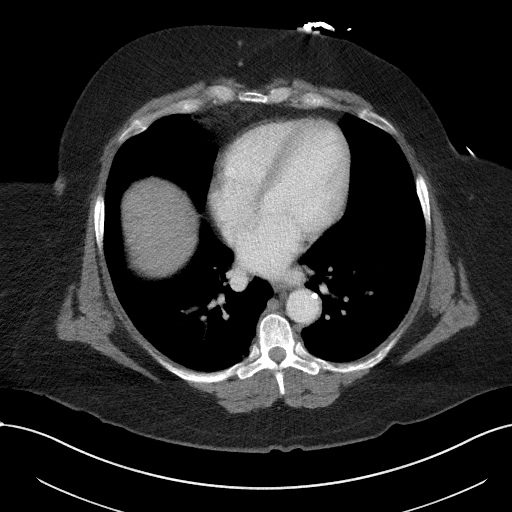

[Series 5: coronal st · coronal · 0.87mm/px · 3 of 120 slices shown]
[im 40/120  soft-tissue]
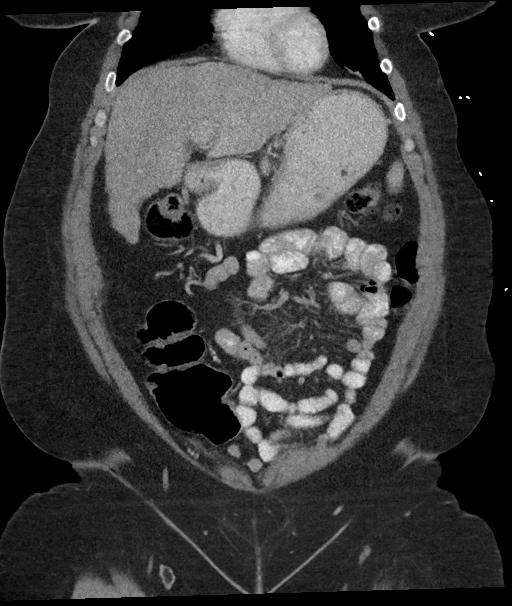
[im 53/120  soft-tissue]
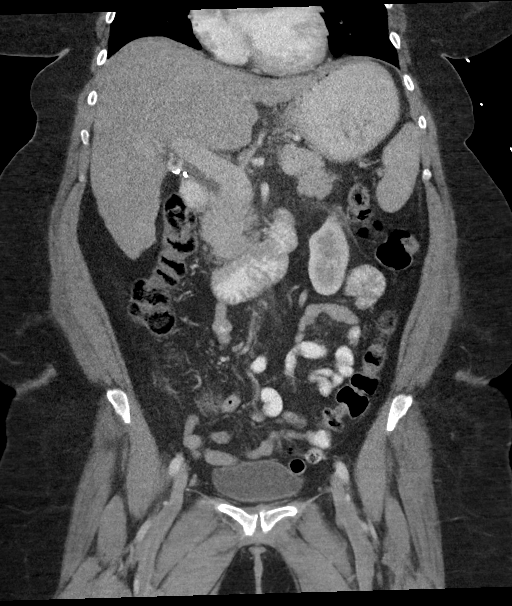
[im 67/120  soft-tissue]
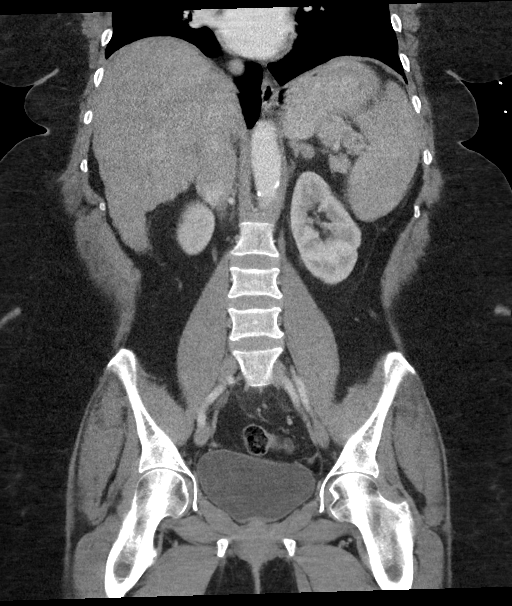

[16 of 46 positions shown; findings below may reference images not displayed]

FINDINGS: Lower chest: No acute abnormality.

Hepatobiliary: Cirrhotic liver. No focal liver abnormality is seen.
Status post cholecystectomy. No biliary dilatation.

Pancreas: Unremarkable. No pancreatic ductal dilatation or
surrounding inflammatory changes.

Spleen: Spleen measuring 16.4 x 5.5 x 14.0 cm (volume = 660 cm^3).

Adrenals/Urinary Tract: Stable 16 mm nodule within the left adrenal
gland (series 2, image 27) from 3968, likely adenoma.

Stomach/Bowel: Stomach is within normal limits. Widely patent
colorectal anastomosis. No evidence of bowel wall thickening,
distention, or inflammatory changes.

Vascular/Lymphatic: Aortic atherosclerosis. No enlarged abdominal or
pelvic lymph nodes.

Reproductive: Status post hysterectomy. No adnexal masses.

Other: No abdominal wall hernia or abnormality. No abdominopelvic
ascites.

Musculoskeletal: No acute or significant osseous findings.
IMPRESSION: 1. No acute process identified.
2. Cirrhotic liver.
3. Splenomegaly, volume 660 cc.
4. Stable left adrenal nodule from 3968, likely adenoma.
5. Aortic atherosclerosis.

By: Bifulco Cont M.D.

## 2019-06-26 IMAGING — DX DG CHEST 1V PORT
1 series · 1 of 1 positions shown · non-contrast
Comparison: Chest radiograph July 13, 2017

CLINICAL DATA: Cough, CHF.  History of bronchitis, substance abuse.

EXAM:
PORTABLE CHEST 1 VIEW

[chest ap]
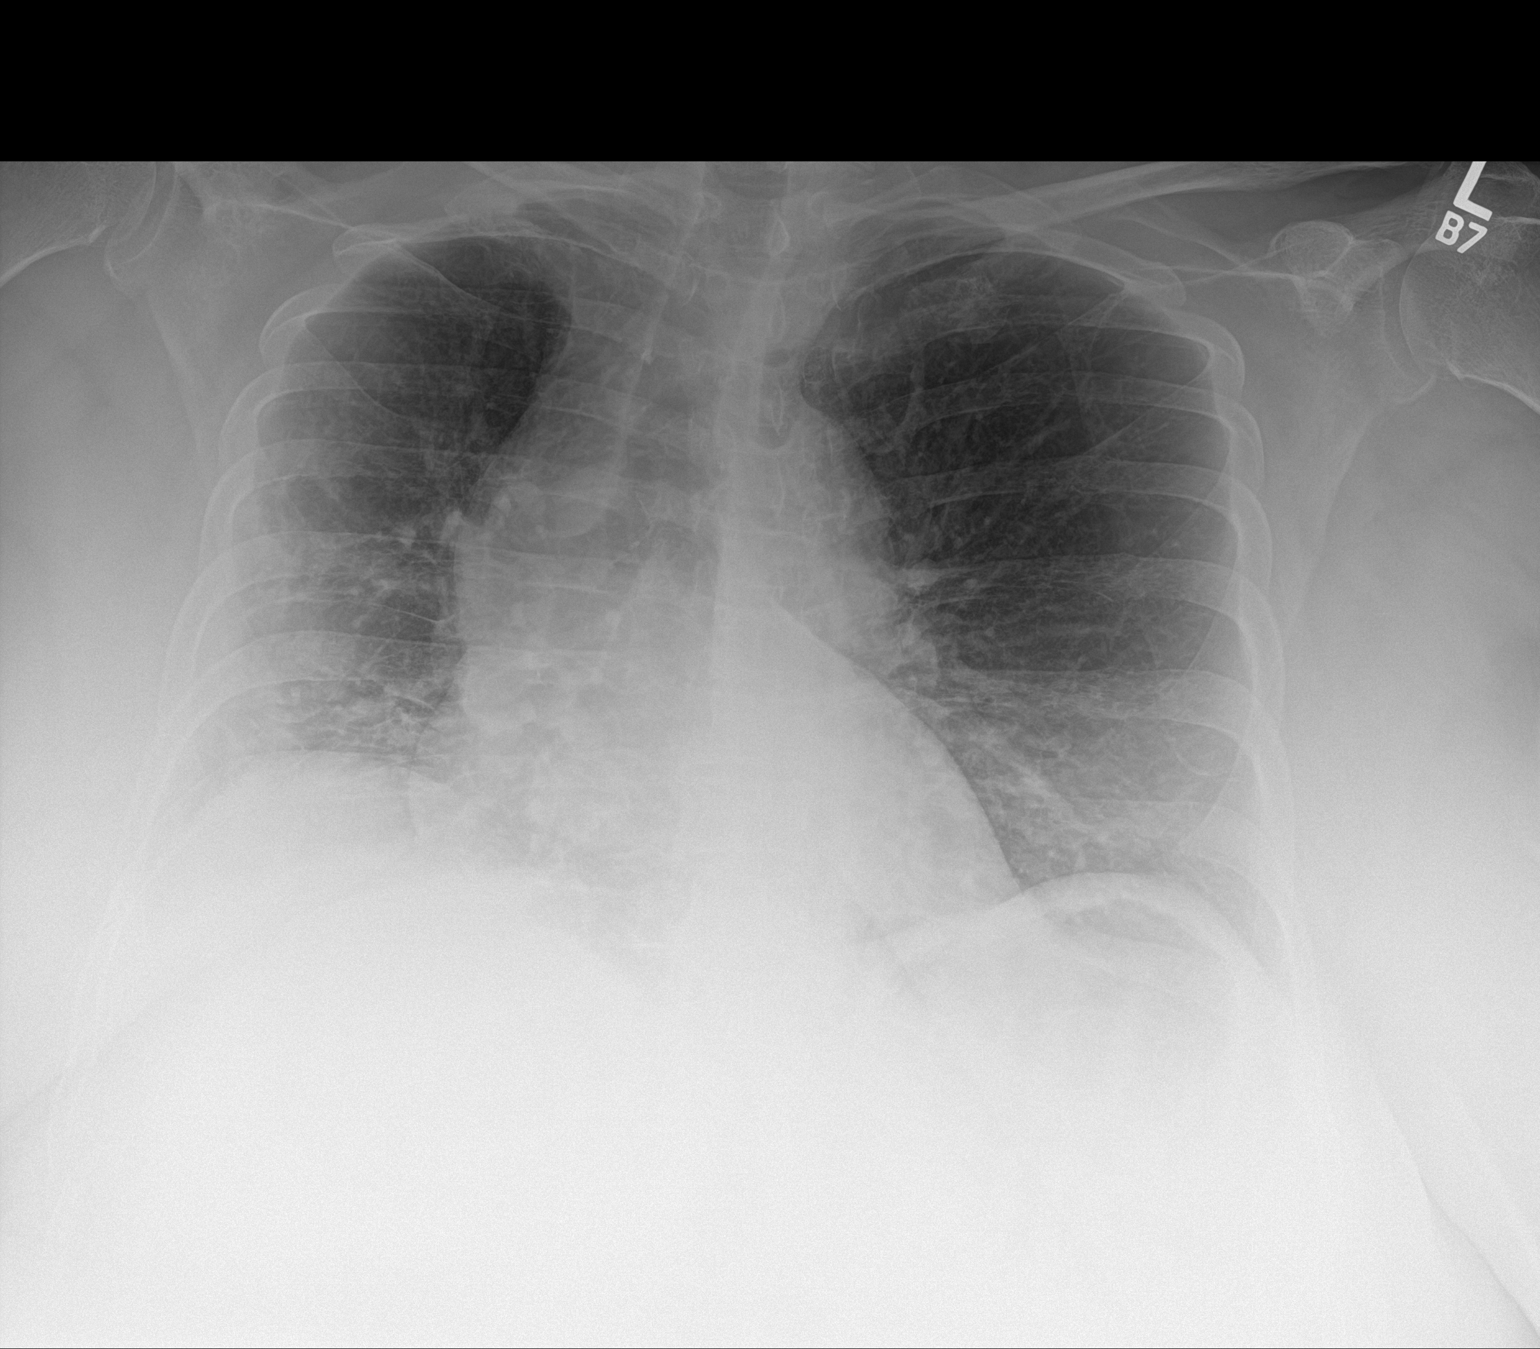

[1 of 1 positions shown; findings below may reference images not displayed]

FINDINGS: Cardiomediastinal silhouette is normal considering rotation to the
RIGHT. Calcified aortic arch. Patchy RIGHT mid and lower lung zone
peripheral airspace opacity with bibasilar strandy densities.
Bronchitic changes. No pleural effusion. No pneumothorax. Large body
habitus. Osseous structures are nonacute.
IMPRESSION: Patchy RIGHT mid and lower lung zone airspace opacity concerning for
pneumonia. Followup PA and lateral chest X-ray is recommended in 3-4
weeks following trial of antibiotic therapy to ensure resolution and
exclude underlying malignancy.

COPD/chronic bronchitic changes.

Aortic Atherosclerosis (NHIAH-UG3.3) and Emphysema (NHIAH-YZ1.1).
